# Patient Record
Sex: Male | Born: 1985 | Race: Black or African American | Hispanic: No | Marital: Single | Smoking: Former smoker
Health system: Southern US, Community
[De-identification: ages and names within clinical notes are randomized; demographics above are authoritative.]

## PROBLEM LIST (undated history)

## (undated) DIAGNOSIS — F1910 Other psychoactive substance abuse, uncomplicated: Secondary | ICD-10-CM

## (undated) HISTORY — PX: NO PAST SURGERIES: SHX2092

---

## 2019-02-05 DIAGNOSIS — I420 Dilated cardiomyopathy: Secondary | ICD-10-CM

## 2019-02-05 HISTORY — DX: Dilated cardiomyopathy: I42.0

## 2019-02-09 ENCOUNTER — Inpatient Hospital Stay (HOSPITAL_COMMUNITY)
Admission: AD | Admit: 2019-02-09 | Discharge: 2019-03-15 | DRG: 001 | Disposition: A | Discharge status: Home Health | Payer: Medicaid - Out of State | Source: Other Acute Inpatient Hospital | Attending: Internal Medicine | Admitting: Internal Medicine

## 2019-02-09 DIAGNOSIS — I340 Nonrheumatic mitral (valve) insufficiency: Secondary | ICD-10-CM | POA: Diagnosis not present

## 2019-02-09 DIAGNOSIS — Z20822 Contact with and (suspected) exposure to covid-19: Secondary | ICD-10-CM | POA: Diagnosis present

## 2019-02-09 DIAGNOSIS — I428 Other cardiomyopathies: Secondary | ICD-10-CM | POA: Diagnosis not present

## 2019-02-09 DIAGNOSIS — J918 Pleural effusion in other conditions classified elsewhere: Secondary | ICD-10-CM | POA: Diagnosis not present

## 2019-02-09 DIAGNOSIS — Z01810 Encounter for preprocedural cardiovascular examination: Secondary | ICD-10-CM | POA: Diagnosis not present

## 2019-02-09 DIAGNOSIS — Z681 Body mass index (BMI) 19 or less, adult: Secondary | ICD-10-CM

## 2019-02-09 DIAGNOSIS — E876 Hypokalemia: Secondary | ICD-10-CM | POA: Diagnosis not present

## 2019-02-09 DIAGNOSIS — Z95811 Presence of heart assist device: Secondary | ICD-10-CM | POA: Diagnosis not present

## 2019-02-09 DIAGNOSIS — K7460 Unspecified cirrhosis of liver: Secondary | ICD-10-CM | POA: Diagnosis present

## 2019-02-09 DIAGNOSIS — J939 Pneumothorax, unspecified: Secondary | ICD-10-CM | POA: Diagnosis not present

## 2019-02-09 DIAGNOSIS — J90 Pleural effusion, not elsewhere classified: Secondary | ICD-10-CM

## 2019-02-09 DIAGNOSIS — J9811 Atelectasis: Secondary | ICD-10-CM | POA: Diagnosis not present

## 2019-02-09 DIAGNOSIS — I420 Dilated cardiomyopathy: Secondary | ICD-10-CM | POA: Diagnosis present

## 2019-02-09 DIAGNOSIS — J95821 Acute postprocedural respiratory failure: Secondary | ICD-10-CM | POA: Diagnosis not present

## 2019-02-09 DIAGNOSIS — D62 Acute posthemorrhagic anemia: Secondary | ICD-10-CM | POA: Diagnosis not present

## 2019-02-09 DIAGNOSIS — R570 Cardiogenic shock: Secondary | ICD-10-CM | POA: Diagnosis present

## 2019-02-09 DIAGNOSIS — Z7189 Other specified counseling: Secondary | ICD-10-CM

## 2019-02-09 DIAGNOSIS — I509 Heart failure, unspecified: Secondary | ICD-10-CM | POA: Diagnosis not present

## 2019-02-09 DIAGNOSIS — E43 Unspecified severe protein-calorie malnutrition: Secondary | ICD-10-CM | POA: Diagnosis present

## 2019-02-09 DIAGNOSIS — I5023 Acute on chronic systolic (congestive) heart failure: Principal | ICD-10-CM | POA: Diagnosis present

## 2019-02-09 DIAGNOSIS — J189 Pneumonia, unspecified organism: Secondary | ICD-10-CM | POA: Diagnosis present

## 2019-02-09 DIAGNOSIS — I361 Nonrheumatic tricuspid (valve) insufficiency: Secondary | ICD-10-CM | POA: Diagnosis not present

## 2019-02-09 DIAGNOSIS — R0600 Dyspnea, unspecified: Secondary | ICD-10-CM

## 2019-02-09 DIAGNOSIS — F17210 Nicotine dependence, cigarettes, uncomplicated: Secondary | ICD-10-CM | POA: Diagnosis present

## 2019-02-09 DIAGNOSIS — D689 Coagulation defect, unspecified: Secondary | ICD-10-CM | POA: Diagnosis present

## 2019-02-09 DIAGNOSIS — J690 Pneumonitis due to inhalation of food and vomit: Secondary | ICD-10-CM | POA: Diagnosis not present

## 2019-02-09 DIAGNOSIS — F1010 Alcohol abuse, uncomplicated: Secondary | ICD-10-CM | POA: Diagnosis present

## 2019-02-09 DIAGNOSIS — Z8249 Family history of ischemic heart disease and other diseases of the circulatory system: Secondary | ICD-10-CM

## 2019-02-09 DIAGNOSIS — Z01818 Encounter for other preprocedural examination: Secondary | ICD-10-CM | POA: Diagnosis not present

## 2019-02-09 DIAGNOSIS — Z9989 Dependence on other enabling machines and devices: Secondary | ICD-10-CM

## 2019-02-09 DIAGNOSIS — Z515 Encounter for palliative care: Secondary | ICD-10-CM | POA: Diagnosis not present

## 2019-02-09 DIAGNOSIS — Z9889 Other specified postprocedural states: Secondary | ICD-10-CM

## 2019-02-09 DIAGNOSIS — F1410 Cocaine abuse, uncomplicated: Secondary | ICD-10-CM | POA: Diagnosis present

## 2019-02-09 HISTORY — DX: Other psychoactive substance abuse, uncomplicated: F19.10

## 2019-02-10 ENCOUNTER — Inpatient Hospital Stay: Payer: Self-pay

## 2019-02-10 ENCOUNTER — Encounter (HOSPITAL_COMMUNITY): Payer: Self-pay | Admitting: Internal Medicine

## 2019-02-10 ENCOUNTER — Inpatient Hospital Stay (HOSPITAL_COMMUNITY): Payer: Medicaid - Out of State

## 2019-02-10 DIAGNOSIS — I340 Nonrheumatic mitral (valve) insufficiency: Secondary | ICD-10-CM

## 2019-02-10 DIAGNOSIS — I361 Nonrheumatic tricuspid (valve) insufficiency: Secondary | ICD-10-CM

## 2019-02-10 DIAGNOSIS — I420 Dilated cardiomyopathy: Secondary | ICD-10-CM

## 2019-02-10 LAB — COOXEMETRY PANEL
Carboxyhemoglobin: 1 % (ref 0.5–1.5)
Carboxyhemoglobin: 1.2 % (ref 0.5–1.5)
Methemoglobin: 1 % (ref 0.0–1.5)
Methemoglobin: 1 % (ref 0.0–1.5)
O2 Saturation: 29.4 %
O2 Saturation: 33.2 %
Total hemoglobin: 11.8 g/dL — ABNORMAL LOW (ref 12.0–16.0)
Total hemoglobin: 12.1 g/dL (ref 12.0–16.0)

## 2019-02-10 LAB — URINALYSIS, ROUTINE W REFLEX MICROSCOPIC
Bilirubin Urine: NEGATIVE
Glucose, UA: NEGATIVE mg/dL
Hgb urine dipstick: NEGATIVE
Ketones, ur: NEGATIVE mg/dL
Leukocytes,Ua: NEGATIVE
Nitrite: NEGATIVE
Protein, ur: NEGATIVE mg/dL
Specific Gravity, Urine: 1.006 (ref 1.005–1.030)
pH: 5 (ref 5.0–8.0)

## 2019-02-10 LAB — CBC WITH DIFFERENTIAL/PLATELET
Abs Immature Granulocytes: 0.06 10*3/uL (ref 0.00–0.07)
Basophils Absolute: 0.1 10*3/uL (ref 0.0–0.1)
Basophils Relative: 1 %
Eosinophils Absolute: 0.2 10*3/uL (ref 0.0–0.5)
Eosinophils Relative: 2 %
HCT: 35.6 % — ABNORMAL LOW (ref 39.0–52.0)
Hemoglobin: 12.1 g/dL — ABNORMAL LOW (ref 13.0–17.0)
Immature Granulocytes: 1 %
Lymphocytes Relative: 29 %
Lymphs Abs: 2.6 10*3/uL (ref 0.7–4.0)
MCH: 33.2 pg (ref 26.0–34.0)
MCHC: 34 g/dL (ref 30.0–36.0)
MCV: 97.8 fL (ref 80.0–100.0)
Monocytes Absolute: 0.9 10*3/uL (ref 0.1–1.0)
Monocytes Relative: 10 %
Neutro Abs: 5.2 10*3/uL (ref 1.7–7.7)
Neutrophils Relative %: 57 %
Platelets: 226 10*3/uL (ref 150–400)
RBC: 3.64 MIL/uL — ABNORMAL LOW (ref 4.22–5.81)
RDW: 15.9 % — ABNORMAL HIGH (ref 11.5–15.5)
WBC: 9 10*3/uL (ref 4.0–10.5)
nRBC: 0 % (ref 0.0–0.2)

## 2019-02-10 LAB — COMPREHENSIVE METABOLIC PANEL
ALT: 34 U/L (ref 0–44)
AST: 33 U/L (ref 15–41)
Albumin: 2.8 g/dL — ABNORMAL LOW (ref 3.5–5.0)
Alkaline Phosphatase: 64 U/L (ref 38–126)
Anion gap: 10 (ref 5–15)
BUN: 10 mg/dL (ref 6–20)
CO2: 21 mmol/L — ABNORMAL LOW (ref 22–32)
Calcium: 8.7 mg/dL — ABNORMAL LOW (ref 8.9–10.3)
Chloride: 100 mmol/L (ref 98–111)
Creatinine, Ser: 1.18 mg/dL (ref 0.61–1.24)
GFR calc Af Amer: >60 mL/min (ref >60)
GFR calc non Af Amer: >60 mL/min (ref >60)
Glucose, Bld: 129 mg/dL — ABNORMAL HIGH (ref 70–99)
Potassium: 4.4 mmol/L (ref 3.5–5.1)
Sodium: 131 mmol/L — ABNORMAL LOW (ref 135–145)
Total Bilirubin: 1.9 mg/dL — ABNORMAL HIGH (ref 0.3–1.2)
Total Protein: 6.1 g/dL — ABNORMAL LOW (ref 6.5–8.1)

## 2019-02-10 LAB — HIV ANTIBODY (ROUTINE TESTING W REFLEX): HIV Screen 4th Generation wRfx: NONREACTIVE

## 2019-02-10 LAB — BRAIN NATRIURETIC PEPTIDE: B Natriuretic Peptide: 2048.6 pg/mL — ABNORMAL HIGH (ref 0.0–100.0)

## 2019-02-10 LAB — PROCALCITONIN: Procalcitonin: 0.38 ng/mL

## 2019-02-10 LAB — PROTIME-INR
INR: 1.2 (ref 0.8–1.2)
Prothrombin Time: 15.2 seconds (ref 11.4–15.2)

## 2019-02-10 LAB — ECHOCARDIOGRAM COMPLETE
Height: 68 in
Weight: 1980.8 oz

## 2019-02-10 LAB — SARS CORONAVIRUS 2 (TAT 6-24 HRS): SARS Coronavirus 2: NEGATIVE

## 2019-02-10 LAB — MAGNESIUM: Magnesium: 1.7 mg/dL (ref 1.7–2.4)

## 2019-02-10 LAB — MRSA PCR SCREENING: MRSA by PCR: NEGATIVE

## 2019-02-10 MED ORDER — ENOXAPARIN SODIUM 40 MG/0.4ML ~~LOC~~ SOLN
40.0000 mg | Freq: Every day | SUBCUTANEOUS | Status: DC
  Administered 2019-02-10: 40 mg via SUBCUTANEOUS
  Filled 2019-02-10 (×2): qty 0.4

## 2019-02-10 MED ORDER — ONDANSETRON HCL 4 MG/2ML IJ SOLN: 4.0000 mg | Freq: Four times a day (QID) | INTRAMUSCULAR | Status: DC | PRN

## 2019-02-10 MED ORDER — ATROPINE SULFATE 0.5 MG/5ML IJ SOSY
0.50 | PREFILLED_SYRINGE | INTRAMUSCULAR | Status: DC
Start: ? — End: 2019-02-10

## 2019-02-10 MED ORDER — DIGOXIN 125 MCG PO TABS
0.1250 mg | ORAL_TABLET | Freq: Every day | ORAL | Status: DC
  Administered 2019-02-10 – 2019-02-25 (×16): 0.125 mg via ORAL
  Filled 2019-02-10 (×17): qty 1

## 2019-02-10 MED ORDER — SODIUM CHLORIDE 0.9% FLUSH: 10.0000 mL | INTRAVENOUS | Status: DC | PRN

## 2019-02-10 MED ORDER — ONDANSETRON HCL 4 MG/2ML IJ SOLN
4.00 | INTRAMUSCULAR | Status: DC
Start: ? — End: 2019-02-10

## 2019-02-10 MED ORDER — FUROSEMIDE 20 MG PO TABS: 20.0000 mg | ORAL_TABLET | Freq: Every day | ORAL | Status: DC

## 2019-02-10 MED ORDER — SPIRONOLACTONE 12.5 MG HALF TABLET
12.5000 mg | ORAL_TABLET | Freq: Every day | ORAL | Status: DC
  Administered 2019-02-10 – 2019-02-14 (×5): 12.5 mg via ORAL
  Filled 2019-02-10 (×6): qty 1

## 2019-02-10 MED ORDER — FUROSEMIDE 10 MG/ML IJ SOLN
80.0000 mg | Freq: Two times a day (BID) | INTRAMUSCULAR | Status: DC
  Administered 2019-02-10 – 2019-02-11 (×3): 80 mg via INTRAVENOUS
  Filled 2019-02-10 (×3): qty 8

## 2019-02-10 MED ORDER — GENERIC EXTERNAL MEDICATION
Status: DC
Start: ? — End: 2019-02-10

## 2019-02-10 MED ORDER — DOBUTAMINE IN D5W 4-5 MG/ML-% IV SOLN
7.5000 ug/kg/min | INTRAVENOUS | Status: DC
  Administered 2019-02-10: 2.5 ug/kg/min via INTRAVENOUS
  Filled 2019-02-10: qty 250

## 2019-02-10 MED ORDER — ALPRAZOLAM 0.25 MG PO TABS
0.2500 mg | ORAL_TABLET | Freq: Two times a day (BID) | ORAL | Status: DC | PRN
  Administered 2019-02-13: 0.25 mg via ORAL
  Filled 2019-02-10 (×2): qty 1

## 2019-02-10 MED ORDER — METOPROLOL SUCCINATE ER 25 MG PO TB24
25.0000 mg | ORAL_TABLET | Freq: Every day | ORAL | Status: DC
  Administered 2019-02-10: 25 mg via ORAL
  Filled 2019-02-10: qty 1

## 2019-02-10 MED ORDER — DOBUTAMINE IN D5W 4-5 MG/ML-% IV SOLN
2.50 | INTRAVENOUS | Status: DC
Start: ? — End: 2019-02-10

## 2019-02-10 MED ORDER — NITROGLYCERIN 0.4 MG SL SUBL
0.40 | SUBLINGUAL_TABLET | SUBLINGUAL | Status: DC
Start: ? — End: 2019-02-10

## 2019-02-10 MED ORDER — SODIUM CHLORIDE 0.9% FLUSH
3.0000 mL | Freq: Two times a day (BID) | INTRAVENOUS | Status: DC
  Administered 2019-02-10 – 2019-02-20 (×7): 3 mL via INTRAVENOUS

## 2019-02-10 MED ORDER — METOPROLOL TARTRATE 25 MG PO TABS
12.50 | ORAL_TABLET | ORAL | Status: DC
Start: 2019-02-10 — End: 2019-02-10

## 2019-02-10 MED ORDER — MAGNESIUM SULFATE 2 GM/50ML IV SOLN
2.0000 g | Freq: Once | INTRAVENOUS | Status: AC
  Administered 2019-02-10: 2 g via INTRAVENOUS
  Filled 2019-02-10: qty 50

## 2019-02-10 MED ORDER — SODIUM CHLORIDE 0.9 % IV SOLN: 250.0000 mL | INTRAVENOUS | Status: DC | PRN

## 2019-02-10 MED ORDER — SODIUM CHLORIDE 0.9 % IV SOLN
2.0000 g | INTRAVENOUS | Status: AC
  Administered 2019-02-10 – 2019-02-14 (×5): 2 g via INTRAVENOUS
  Filled 2019-02-10: qty 2
  Filled 2019-02-10 (×2): qty 20
  Filled 2019-02-10 (×2): qty 2
  Filled 2019-02-10: qty 20

## 2019-02-10 MED ORDER — DM-GUAIFENESIN ER 30-600 MG PO TB12
1.0000 | ORAL_TABLET | Freq: Two times a day (BID) | ORAL | Status: DC
  Administered 2019-02-10 – 2019-02-18 (×18): 1 via ORAL
  Filled 2019-02-10 (×19): qty 1

## 2019-02-10 MED ORDER — CHLORHEXIDINE GLUCONATE CLOTH 2 % EX PADS
6.0000 | MEDICATED_PAD | Freq: Every day | CUTANEOUS | Status: DC
  Administered 2019-02-10 – 2019-02-25 (×14): 6 via TOPICAL

## 2019-02-10 MED ORDER — ACETAMINOPHEN 325 MG PO TABS: 650.0000 mg | ORAL_TABLET | ORAL | Status: DC | PRN

## 2019-02-10 MED ORDER — ZOLPIDEM TARTRATE 5 MG PO TABS: 5.0000 mg | ORAL_TABLET | Freq: Every evening | ORAL | Status: DC | PRN

## 2019-02-10 MED ORDER — SODIUM CHLORIDE 0.9% FLUSH
10.0000 mL | Freq: Two times a day (BID) | INTRAVENOUS | Status: DC
  Administered 2019-02-10 – 2019-02-20 (×8): 10 mL

## 2019-02-10 MED ORDER — HEPARIN SODIUM (PORCINE) 5000 UNIT/ML IJ SOLN
5000.00 | INTRAMUSCULAR | Status: DC
Start: 2019-02-10 — End: 2019-02-10

## 2019-02-10 MED ORDER — ACETAMINOPHEN 325 MG PO TABS
650.00 | ORAL_TABLET | ORAL | Status: DC
Start: ? — End: 2019-02-10

## 2019-02-10 MED ORDER — SODIUM CHLORIDE 0.9 % IV SOLN
100.0000 mg | Freq: Two times a day (BID) | INTRAVENOUS | Status: AC
  Administered 2019-02-10 – 2019-02-15 (×10): 100 mg via INTRAVENOUS
  Filled 2019-02-10 (×11): qty 100

## 2019-02-10 MED ORDER — SODIUM CHLORIDE 0.9% FLUSH: 3.0000 mL | INTRAVENOUS | Status: DC | PRN

## 2019-02-10 MED ORDER — BENZONATATE 100 MG PO CAPS
100.00 | ORAL_CAPSULE | ORAL | Status: DC
Start: 2019-02-10 — End: 2019-02-10

## 2019-02-10 NOTE — Progress Notes (Signed)
   PICC placed.  CO-OX 29%. Increase dobutamine to 5 mcg. Repeat CO-OX 2 hours   Cayenne Breault NP-C  3:50 PM

## 2019-02-10 NOTE — Progress Notes (Signed)
Echocardiogram 2D Echocardiogram has been performed.  Warren Lacy Neamiah Sciarra 02/10/2019, 11:25 AM

## 2019-02-10 NOTE — Progress Notes (Signed)
Peripherally Inserted Central Catheter/Midline Placement  The IV Nurse has discussed with the patient and/or persons authorized to consent for the patient, the purpose of this procedure and the potential benefits and risks involved with this procedure.  The benefits include less needle sticks, lab draws from the catheter, and the patient may be discharged home with the catheter. Risks include, but not limited to, infection, bleeding, blood clot (thrombus formation), and puncture of an artery; nerve damage and irregular heartbeat and possibility to perform a PICC exchange if needed/ordered by physician.  Alternatives to this procedure were also discussed.  Bard Power PICC patient education guide, fact sheet on infection prevention and patient information card has been provided to patient /or left at bedside.    PICC/Midline Placement Documentation  PICC Double Lumen 02/10/19 PICC Right Basilic 38 cm 0 cm (Active)  Indication for Insertion or Continuance of Line Poor Vasculature-patient has had multiple peripheral attempts or PIVs lasting less than 24 hours 02/10/19 1300  Exposed Catheter (cm) 0 cm 02/10/19 1300  Site Assessment Clean;Dry;Intact 02/10/19 1300  Lumen #1 Status Flushed;Saline locked;Blood return noted 02/10/19 1300  Lumen #2 Status Flushed;Saline locked;Blood return noted 02/10/19 1300  Dressing Type Transparent;Securing device 02/10/19 1300  Dressing Status Clean;Dry;Intact;Antimicrobial disc in place 02/10/19 1300  Line Care Connections checked and tightened 02/10/19 1300  Dressing Intervention New dressing;Other (Comment) 02/10/19 1300  Dressing Change Due 02/17/19 02/10/19 1300     Patient gave written consent  Jon Martin 02/10/2019, 1:11 PM

## 2019-02-10 NOTE — Progress Notes (Signed)
MCS EDUCATION NOTE:                VAD evaluation consent reviewed and signed by patient, designated caregiver not present.  Initial VAD teaching completed with patient.   VAD educational packet including "HM II Patient Handbook", "HM II Left Ventricular Assist System" packet, "[place] HM II Patient Education", and "Decision Aids for Left Ventricular Assist Device" reviewed in detail and left at bedside for continued reference.  All questions answered regarding VAD implant, hospital stay, and what to expect when discharged home living with a heart pump. Pt identified his mother and brother as his primary caregivers if this therapy should be deemed appropriate.  Explained need for 24/7 care when pt is discharged home due to sternal precautions, adaptation to living on support, emotional support, consistent and meticulous exit site care and management, medication adherence and high volume of follow up visits with the [facility] after discharge; both pt and caregiver verbalized understanding of above.   Explained that LVAD can be implanted for two indications in the setting of advanced left ventricular heart failure treatment:  1. Bridge to transplant - used for patients who cannot safely wait for heart transplant without this device.        Or    2. Destination therapy - used for patients until end of life or recovery of heart function.  Patient acknowledges that the indication at this point in time for LVAD therapy would be for DT due to recent positive drug screen for cocaine.   Provided brief equipment overview and demonstration with HeartMate III including discussion on the following:    A) pump  B) controller  C) batteries/battery clips  Reviewed and supplied a copy of home inspection check list stressing that only three pronged grounded power outlets can be used for VAD equipment. Patient confirmed home has electrical outlets that will support the equipment.    Identified the following  lifestyle modifications while living on MCS:   1. No driving for at least six weeks and then only if doctor gives permission to do so.   2. No tub baths while pump implanted, and shower only when doctor gives permission.   3. No swimming or submersion in water while implanted with pump.   4. No contact sports or engaging in jumping activities.    Discussed with pt and family that they will be required to purchase dressing supplies as long as patient has the VAD in place.   Reinforced need for 24 hour/7 day week caregivers; pt designated mother and brother as caregivers. He will also need to abide by sternal precautions with no lifting >10lbs, pushing, pulling and will need assistance with adapting to new life style with VAD equipment and care.   Intermacs patient survival statistics through June 2020 reviewed with patient and caregiver as follows:                                                The patient understands that from this discussion it does not mean that they will receive the device, but that depends on an extensive evaluation process. The patient is aware of the fact that if at anytime they want to stop the evaluation process they can.  All questions have been answered at this time and contact information was provided should they encounter any further questions.  He is agreeable at this time to the evaluation process and will move forward.    [name], RN VAD Coordinator   Office: [phone] 24/7 VAD Pager: [phone]

## 2019-02-10 NOTE — Progress Notes (Signed)
Pt with [place] M[place]s score. Not an acute change as this appears to be pts baseline. Will continue to monitor.

## 2019-02-10 NOTE — Progress Notes (Addendum)
Advanced Heart Failure Rounding Note  PCP-Cardiologist: No primary care provider on file.   Subjective:    Transferred to [facility] for Advanced Heart Failure consultation.   Remains dobutamine 2.5 mcg.   Complaining of cough.   Objective:   Weight Range: 56.2 kg Body mass index is 18.82 kg/m.   Vital Signs:   Temp:  [97.6 F (36.4 C)-98 F (36.7 C)] 97.7 F (36.5 C) (01/06 0755) Pulse Rate:  [116-128] 123 (01/06 0800) Resp:  [11-29] 18 (01/06 0800) BP: (91-103)/(71-78) 99/73 (01/06 0755) SpO2:  [90 %-98 %] 98 % (01/06 0800) Weight:  [56.2 kg] 56.2 kg (01/06 0409) Last BM Date: 02/09/19  Weight change: Filed Weights   02/10/19 0024 02/10/19 0409  Weight: 56.2 kg 56.2 kg    Intake/Output:   Intake/Output Summary (Last 24 hours) at 02/10/2019 0912 Last data filed at 02/10/2019 0300 Gross per 24 hour  Intake 244.02 ml  Output --  Net 244.02 ml      Physical Exam    General:  Thin  No resp difficulty HEENT: Normal Neck: Supple. JVP to jaw  . Carotids 2+ bilat; no bruits. No lymphadenopathy or thyromegaly appreciated. Cor: PMI nondisplaced. Regular rate & rhythm. No rubs, or murmurs. + S3  Lungs: Clear Abdomen: Soft, nontender, nondistended. No hepatosplenomegaly. No bruits or masses. Good bowel sounds. Extremities: Cool extremities, no cyanosis, clubbing, rash, trace lower extremity edema Neuro: Alert & orientedx3, cranial nerves grossly intact. moves all 4 extremities w/o difficulty. Affect pleasant   Telemetry  Sinus Tach 110s   EKG   Sinus Tach 119 bpm    Labs    CBC Recent Labs    02/10/19 0219  WBC 9.0  NEUTROABS 5.2  HGB 12.1*  HCT 35.6*  MCV 97.8  PLT 226   Basic Metabolic Panel Recent Labs    77/82/42 0219  NA 131*  K 4.4  CL 100  CO2 21*  GLUCOSE 129*  BUN 10  CREATININE 1.18  CALCIUM 8.7*  MG 1.7   Liver Function Tests Recent Labs    02/10/19 0219  AST 33  ALT 34  ALKPHOS 64  BILITOT 1.9*  PROT 6.1*  ALBUMIN 2.8*    No results for input(s): LIPASE, AMYLASE in the last 72 hours. Cardiac Enzymes No results for input(s): CKTOTAL, CKMB, CKMBINDEX, TROPONINI in the last 72 hours.  BNP: BNP (last 3 results) Recent Labs    02/10/19 0219  BNP 2,048.6*    ProBNP (last 3 results) No results for input(s): PROBNP in the last 8760 hours.   D-Dimer No results for input(s): DDIMER in the last 72 hours. Hemoglobin A1C No results for input(s): HGBA1C in the last 72 hours. Fasting Lipid Panel No results for input(s): CHOL, HDL, LDLCALC, TRIG, CHOLHDL, LDLDIRECT in the last 72 hours. Thyroid Function Tests No results for input(s): TSH, T4TOTAL, T3FREE, THYROIDAB in the last 72 hours.  Invalid input(s): FREET3  Other results:   Imaging    No results found.   Medications:     Scheduled Medications:  enoxaparin (LOVENOX) injection  40 mg Subcutaneous Daily   furosemide  20 mg Oral Daily   metoprolol succinate  25 mg Oral Daily   sodium chloride flush  3 mL Intravenous Q12H    Infusions:  sodium chloride     DOBUTamine 2.5 mcg/kg/min (02/10/19 0300)   magnesium sulfate bolus IVPB      PRN Medications: sodium chloride, acetaminophen, ALPRAZolam, ondansetron (ZOFRAN) IV, sodium chloride flush, zolpidem  Assessment/Plan   1. Acute/Chronic Systolic HF  ECHO at [facility] EF , 20%. Repeat ECHO today.  On dubutamine 2.5 mcg. Place PICC/CVP and check co-ox. Volume status elevated. Start 80 mg IV lasix twice daily.  - Stop metoprolol with low output HF - Add digoxin 0.125 mg daily.  - Add 12.5 mg spiro daily.  -Consult CT surgery and VAD team.   2. Cough CXR now  Covid neg.   3. ETOH Abuse Drinks alcohol daily. ~6 pack per day.   4. Cocaine  UDS was + at [facility]  He denies cocaine use.    Length of Stay: 1  Amy Clegg, NP  02/10/2019, 9:12 AM  Advanced Heart Failure Team Pager [phone] (M-F; 7a - 4p)  Please contact [facility] for night-coverage after hours  (4p -7a ) and weekends on [email or web address]  Agree. See my note from the same day.   Glori Bickers, MD  11:07 AM

## 2019-02-10 NOTE — H&P (Signed)
Cardiology Admission History and Physical:   Patient ID: Jon Martin MRN: [id number]; DOB: [birth year]   Admission date: 02/09/2019  Primary Care Provider: Nonah Mattes, MD Primary Cardiologist: No primary care provider on file. [place] Primary Electrophysiologist:  None   Chief Complaint:  LVAD eval  Patient Profile:   Jon Martin is a 34 y.o. male with h/o NIDCM, EF 20%, h/o LifeVest transferred from OSH after presenting there w/ SOB/DOE. Apparently he is here for evaluation for possible LVAD; it sounds like he has been admitted several times over the past several weeks and has become essentially inotrope dependent. He was transferred on dobutamine 2.29mcg/kg/min.  History of Present Illness:   [name] was admitted at [facility] 02-07-19 after presenting there with worsening SOB/DOE over a 48h period. He had been admitted for a protracted stay just prior to that presentation for his HF, requiring inotropes. Notes indicate pt's SOB improved w/ inotrope therapy on this admission, and pt was transferred here for consideration for LVAD. It does not sound like volume overload has been a big issue; cardiology notes from [facility] indicate pt has basically been euvolemic at least for the past day or two. Pt currently has no acute complaints. He has dry cough but otherwise asymptomatic from cardiac standpoint.  Heart Pathway Score:     Past Medical History:  Diagnosis Date  . Dilated cardiomyopathy (HCC)   . Polysubstance abuse (HCC)     Dilated CM, presumed non-ischemic (no recent LHC); EF 15-20% H/o PSA   Medications Prior to Admission: Prior to Admission medications   Not on File   Home meds: lasix 20mg  daily Metoprolol 12.5mg  bid  Allergies:   No Known Allergies  Social History:  +current smoker, +hx cocaine and EtOH   Family History:   The patient's family history includes CAD in his father.    ROS:  Please see the history of present illness.  All other  ROS reviewed and negative.     Physical Exam/Data:   Vitals:   02/10/19 0024  BP: 93/71  Pulse: (!) 128  Resp: (!) 23  Temp: 98 F (36.7 C)  TempSrc: Oral  SpO2: 95%   No intake or output data in the 24 hours ending 02/10/19 0110 No flowsheet data found.   There is no height or weight on file to calculate BMI.  General:  Well nourished, well developed, in no acute distress HEENT: normal Lymph: no adenopathy Neck: EJ distended Endocrine:  No thryomegaly Vascular: No carotid bruits; DP pulses difficult to find Cardiac:  normal S1, S2; RRR, tachy; 3/6 apical systolic murmur Lungs:  clear to auscultation bilaterally, no wheezing, rhonchi or rales  Abd: soft, nontender, no hepatomegaly  Ext: no edema Musculoskeletal:  No deformities, BUE and BLE strength normal and equal Skin: warm and dry  Neuro:  No gross neuro deficits Psych:  Normal affect    EKG:  EKG pending; no EKG was sent from transferring medical center Tele shows ST with HR low 100s  Relevant CV Studies: TTE 01-26-19 Dilated LV w/ diffuse HK, EF 15-20%, grade 2 DD, dilated LA, severe MR, mod-severe TR, est RVSP 60-63mmHg, tr PI, bilateral pleural effusions  Laboratory Data:  High Sensitivity Troponin:  No results for input(s): TROPONINIHS in the last 720 hours.    ChemistryNo results for input(s): NA, K, CL, CO2, GLUCOSE, BUN, CREATININE, CALCIUM, GFRNONAA, GFRAA, ANIONGAP in the last 168 hours.  No results for input(s): PROT, ALBUMIN, AST, ALT, ALKPHOS, BILITOT in the last  168 hours. HematologyNo results for input(s): WBC, RBC, HGB, HCT, MCV, MCH, MCHC, RDW, PLT in the last 168 hours. BNPNo results for input(s): BNP, PROBNP in the last 168 hours.  DDimer No results for input(s): DDIMER in the last 168 hours.  01-25-19 TSH 3.10 (N), T4 7.2 Hgb 11.8, Hct 36.4 Na 133, K 4.1, Cr 1.5  Radiology/Studies:  No results found.       Assessment and Plan:   1. Dilated CM: EF 15-20%. Pt will be admitted to  advanced HF service for further w/u for possible LVAD. Continue dobutamine at current dose 2.64mcg/kg/min. Plan discussed w/ advanced HF service (Dr. Haroldine Laws) this evening and he plans to see pt in the AM. Cont low-dose metoprolol (will change tartrate to succinate) and standing lasix 20mg  daily. He is not on ACEi/ARB, presumably due to hypotension at OSH. Appears reasonably well-compensated/opti-volemic.  2. PSA: a little tachycardic, but he is due for BB. No other s/sx active withdrawal   For questions or updates, please contact [place] Please consult [email or web address] for contact info under     Signed, Rudean Curt, MD, [facility]  02/10/2019 1:10 AM

## 2019-02-10 NOTE — Plan of Care (Signed)

## 2019-02-10 NOTE — Progress Notes (Signed)
Pt arrived to unit via EMS. Transferred from another facility. Paged MD to get orders. VS obtained. Pt resting comfortably with call bell in reach. Will continue to monitor.

## 2019-02-10 NOTE — Progress Notes (Signed)
CSW met briefly with patient to discuss LVAD work up and completed psychosocial assessment. Patient reports he lives with his mother and brother in [place]. CSW will meet with patient tomorrow and hopefully have family on the line during assessment. CSW will continue to follow for LVAD work up. Jon Martin, [place], [place]

## 2019-02-10 NOTE — Progress Notes (Signed)
  Dobutamine increased to 5 mcg. CO-OX 33%  Increase to 7.5 mcg. May need to add norepi tomorrow +/- mechanical support.   Amy Clegg NP-C 8:47 PM

## 2019-02-11 ENCOUNTER — Telehealth (HOSPITAL_COMMUNITY): Payer: Self-pay | Admitting: Licensed Clinical Social Worker

## 2019-02-11 ENCOUNTER — Encounter (HOSPITAL_COMMUNITY): Payer: Self-pay | Admitting: Anesthesiology

## 2019-02-11 ENCOUNTER — Inpatient Hospital Stay (HOSPITAL_COMMUNITY): Payer: Medicaid - Out of State

## 2019-02-11 ENCOUNTER — Encounter (HOSPITAL_COMMUNITY)
Admission: AD | Disposition: A | Discharge status: Home Health | Payer: Self-pay | Source: Other Acute Inpatient Hospital | Attending: Internal Medicine

## 2019-02-11 ENCOUNTER — Other Ambulatory Visit: Payer: Self-pay

## 2019-02-11 DIAGNOSIS — E43 Unspecified severe protein-calorie malnutrition: Secondary | ICD-10-CM | POA: Insufficient documentation

## 2019-02-11 DIAGNOSIS — I428 Other cardiomyopathies: Secondary | ICD-10-CM

## 2019-02-11 DIAGNOSIS — I509 Heart failure, unspecified: Secondary | ICD-10-CM

## 2019-02-11 HISTORY — PX: RIGHT/LEFT HEART CATH AND CORONARY ANGIOGRAPHY: CATH118266

## 2019-02-11 LAB — COOXEMETRY PANEL
Carboxyhemoglobin: 0.9 % (ref 0.5–1.5)
Carboxyhemoglobin: 1 % (ref 0.5–1.5)
Carboxyhemoglobin: 1.2 % (ref 0.5–1.5)
Methemoglobin: 0.5 % (ref 0.0–1.5)
Methemoglobin: 1.2 % (ref 0.0–1.5)
Methemoglobin: 1.1 % (ref 0.0–1.5)
O2 Saturation: 21.4 %
O2 Saturation: 29.6 %
O2 Saturation: 42.4 %
Total hemoglobin: 11.4 g/dL — ABNORMAL LOW (ref 12.0–16.0)
Total hemoglobin: 11.3 g/dL — ABNORMAL LOW (ref 12.0–16.0)
Total hemoglobin: 11.7 g/dL — ABNORMAL LOW (ref 12.0–16.0)

## 2019-02-11 LAB — ANTITHROMBIN III: AntiThromb III Func: 68 % — ABNORMAL LOW (ref 75–120)

## 2019-02-11 LAB — POCT I-STAT EG7
Acid-Base Excess: 1 mmol/L (ref 0.0–2.0)
Acid-Base Excess: 2 mmol/L (ref 0.0–2.0)
Acid-Base Excess: 2 mmol/L (ref 0.0–2.0)
Bicarbonate: 24.5 mmol/L (ref 20.0–28.0)
Bicarbonate: 25.7 mmol/L (ref 20.0–28.0)
Bicarbonate: 25.3 mmol/L (ref 20.0–28.0)
Calcium, Ion: 1.03 mmol/L — ABNORMAL LOW (ref 1.15–1.40)
Calcium, Ion: 1.1 mmol/L — ABNORMAL LOW (ref 1.15–1.40)
Calcium, Ion: 1.08 mmol/L — ABNORMAL LOW (ref 1.15–1.40)
HCT: 35 % — ABNORMAL LOW (ref 39.0–52.0)
HCT: 36 % — ABNORMAL LOW (ref 39.0–52.0)
HCT: 39 % (ref 39.0–52.0)
Hemoglobin: 11.9 g/dL — ABNORMAL LOW (ref 13.0–17.0)
Hemoglobin: 12.2 g/dL — ABNORMAL LOW (ref 13.0–17.0)
Hemoglobin: 13.3 g/dL (ref 13.0–17.0)
O2 Saturation: 70 %
O2 Saturation: 71 %
O2 Saturation: 68 %
Potassium: 2.9 mmol/L — ABNORMAL LOW (ref 3.5–5.1)
Potassium: 3.1 mmol/L — ABNORMAL LOW (ref 3.5–5.1)
Potassium: 3.1 mmol/L — ABNORMAL LOW (ref 3.5–5.1)
Sodium: 137 mmol/L (ref 135–145)
Sodium: 139 mmol/L (ref 135–145)
Sodium: 148 mmol/L — ABNORMAL HIGH (ref 135–145)
TCO2: 26 mmol/L (ref 22–32)
TCO2: 27 mmol/L (ref 22–32)
TCO2: 26 mmol/L (ref 22–32)
pCO2, Ven: 34.7 mmHg — ABNORMAL LOW (ref 44.0–60.0)
pCO2, Ven: 35.9 mmHg — ABNORMAL LOW (ref 44.0–60.0)
pCO2, Ven: 35.9 mmHg — ABNORMAL LOW (ref 44.0–60.0)
pH, Ven: 7.457 — ABNORMAL HIGH (ref 7.250–7.430)
pH, Ven: 7.463 — ABNORMAL HIGH (ref 7.250–7.430)
pH, Ven: 7.456 — ABNORMAL HIGH (ref 7.250–7.430)
pO2, Ven: 34 mmHg (ref 32.0–45.0)
pO2, Ven: 35 mmHg (ref 32.0–45.0)
pO2, Ven: 33 mmHg (ref 32.0–45.0)

## 2019-02-11 LAB — BASIC METABOLIC PANEL
Anion gap: 10 (ref 5–15)
BUN: 13 mg/dL (ref 6–20)
CO2: 25 mmol/L (ref 22–32)
Calcium: 8.4 mg/dL — ABNORMAL LOW (ref 8.9–10.3)
Chloride: 98 mmol/L (ref 98–111)
Creatinine, Ser: 1.14 mg/dL (ref 0.61–1.24)
GFR calc Af Amer: >60 mL/min (ref >60)
GFR calc non Af Amer: >60 mL/min (ref >60)
Glucose, Bld: 146 mg/dL — ABNORMAL HIGH (ref 70–99)
Potassium: 3.7 mmol/L (ref 3.5–5.1)
Sodium: 133 mmol/L — ABNORMAL LOW (ref 135–145)

## 2019-02-11 LAB — POCT I-STAT 7, (LYTES, BLD GAS, ICA,H+H)
Bicarbonate: 23.3 mmol/L (ref 20.0–28.0)
Calcium, Ion: 0.97 mmol/L — ABNORMAL LOW (ref 1.15–1.40)
HCT: 34 % — ABNORMAL LOW (ref 39.0–52.0)
Hemoglobin: 11.6 g/dL — ABNORMAL LOW (ref 13.0–17.0)
O2 Saturation: 97 %
Potassium: 3 mmol/L — ABNORMAL LOW (ref 3.5–5.1)
Sodium: 140 mmol/L (ref 135–145)
TCO2: 24 mmol/L (ref 22–32)
pCO2 arterial: 31.9 mmHg — ABNORMAL LOW (ref 32.0–48.0)
pH, Arterial: 7.472 — ABNORMAL HIGH (ref 7.350–7.450)
pO2, Arterial: 87 mmHg (ref 83.0–108.0)

## 2019-02-11 LAB — LIPID PANEL
Cholesterol: 89 mg/dL (ref 0–200)
HDL: 25 mg/dL — ABNORMAL LOW (ref >40)
LDL Cholesterol: 52 mg/dL (ref 0–99)
Total CHOL/HDL Ratio: 3.6 RATIO
Triglycerides: 58 mg/dL (ref <150)
VLDL: 12 mg/dL (ref 0–40)

## 2019-02-11 LAB — HEMOGLOBIN A1C
Hgb A1c MFr Bld: 5.2 % (ref 4.8–5.6)
Mean Plasma Glucose: 102.54 mg/dL

## 2019-02-11 LAB — URIC ACID: Uric Acid, Serum: 6 mg/dL (ref 3.7–8.6)

## 2019-02-11 LAB — HEPATITIS B SURFACE ANTIBODY,QUALITATIVE: Hep B S Ab: REACTIVE — AB

## 2019-02-11 LAB — ECHO INTRAOPERATIVE TEE
Height: 68 in
Weight: 1922.41 oz

## 2019-02-11 LAB — ABO/RH: ABO/RH(D): O POS

## 2019-02-11 LAB — PROCALCITONIN: Procalcitonin: 0.44 ng/mL

## 2019-02-11 LAB — MAGNESIUM: Magnesium: 1.7 mg/dL (ref 1.7–2.4)

## 2019-02-11 LAB — HEPATITIS B CORE ANTIBODY, IGM: Hep B C IgM: NONREACTIVE

## 2019-02-11 LAB — LACTATE DEHYDROGENASE: LDH: 243 U/L — ABNORMAL HIGH (ref 98–192)

## 2019-02-11 LAB — PREALBUMIN: Prealbumin: 8.6 mg/dL — ABNORMAL LOW (ref 18–38)

## 2019-02-11 LAB — PREPARE RBC (CROSSMATCH)

## 2019-02-11 LAB — HEPATITIS C ANTIBODY: HCV Ab: NONREACTIVE

## 2019-02-11 LAB — HEPATITIS B SURFACE ANTIGEN: Hepatitis B Surface Ag: NONREACTIVE

## 2019-02-11 SURGERY — CANCELLED PROCEDURE
Anesthesia: General

## 2019-02-11 SURGERY — RIGHT/LEFT HEART CATH AND CORONARY ANGIOGRAPHY
Anesthesia: LOCAL

## 2019-02-11 MED ORDER — GENERIC EXTERNAL MEDICATION
Status: DC
Start: ? — End: 2019-02-11

## 2019-02-11 MED ORDER — ENSURE ENLIVE PO LIQD
237.0000 mL | Freq: Three times a day (TID) | ORAL | Status: DC
  Administered 2019-02-12 – 2019-02-25 (×38): 237 mL via ORAL

## 2019-02-11 MED ORDER — ENOXAPARIN SODIUM 40 MG/0.4ML ~~LOC~~ SOLN
40.0000 mg | SUBCUTANEOUS | Status: DC
  Administered 2019-02-12 – 2019-02-25 (×14): 40 mg via SUBCUTANEOUS
  Filled 2019-02-11 (×15): qty 0.4

## 2019-02-11 MED ORDER — SODIUM CHLORIDE 0.9 % IV SOLN
250.0000 mL | INTRAVENOUS | Status: DC | PRN
  Administered 2019-02-12 – 2019-02-17 (×2): 250 mL via INTRAVENOUS

## 2019-02-11 MED ORDER — GUAIFENESIN ER 600 MG PO TB12: 600.0000 mg | ORAL_TABLET | Freq: Two times a day (BID) | ORAL | Status: DC

## 2019-02-11 MED ORDER — VERAPAMIL HCL 2.5 MG/ML IV SOLN
INTRAVENOUS | Status: DC | PRN
  Administered 2019-02-11: 10 mL via INTRA_ARTERIAL

## 2019-02-11 MED ORDER — HYDRALAZINE HCL 20 MG/ML IJ SOLN: 10.0000 mg | INTRAMUSCULAR | Status: AC | PRN

## 2019-02-11 MED ORDER — SODIUM CHLORIDE 0.9% FLUSH
3.0000 mL | Freq: Two times a day (BID) | INTRAVENOUS | Status: DC
  Administered 2019-02-11 – 2019-02-20 (×6): 3 mL via INTRAVENOUS

## 2019-02-11 MED ORDER — HEPARIN (PORCINE) IN NACL 1000-0.9 UT/500ML-% IV SOLN
INTRAVENOUS | Status: DC | PRN
  Administered 2019-02-11: 500 mL

## 2019-02-11 MED ORDER — POTASSIUM CHLORIDE 10 MEQ/50ML IV SOLN: 10.0000 meq | INTRAVENOUS | Status: DC

## 2019-02-11 MED ORDER — POTASSIUM CHLORIDE CRYS ER 20 MEQ PO TBCR
20.0000 meq | EXTENDED_RELEASE_TABLET | Freq: Once | ORAL | Status: DC
  Filled 2019-02-11: qty 1

## 2019-02-11 MED ORDER — AMIODARONE LOAD VIA INFUSION
150.0000 mg | Freq: Once | INTRAVENOUS | Status: AC
  Administered 2019-02-11: 150 mg via INTRAVENOUS
  Filled 2019-02-11: qty 83.34

## 2019-02-11 MED ORDER — SODIUM CHLORIDE 0.9% FLUSH: 10.0000 mL | INTRAVENOUS | Status: DC | PRN

## 2019-02-11 MED ORDER — MIDAZOLAM HCL (PF) 10 MG/2ML IJ SOLN
INTRAMUSCULAR | Status: AC
  Filled 2019-02-11: qty 2

## 2019-02-11 MED ORDER — HEPARIN SODIUM (PORCINE) 1000 UNIT/ML IJ SOLN
INTRAMUSCULAR | Status: AC
  Filled 2019-02-11: qty 1

## 2019-02-11 MED ORDER — SODIUM CHLORIDE 0.9 % IV SOLN
INTRAVENOUS | Status: AC
  Filled 2019-02-11: qty 1.2

## 2019-02-11 MED ORDER — DOBUTAMINE IN D5W 4-5 MG/ML-% IV SOLN
1.0000 ug/kg/min | INTRAVENOUS | Status: DC
  Administered 2019-02-12: 5 ug/kg/min via INTRAVENOUS
  Filled 2019-02-11 (×2): qty 250

## 2019-02-11 MED ORDER — ETOMIDATE 2 MG/ML IV SOLN
INTRAVENOUS | Status: AC
  Filled 2019-02-11: qty 10

## 2019-02-11 MED ORDER — SODIUM CHLORIDE 0.9% FLUSH: 3.0000 mL | INTRAVENOUS | Status: DC | PRN

## 2019-02-11 MED ORDER — FUROSEMIDE 10 MG/ML IJ SOLN
80.0000 mg | Freq: Once | INTRAMUSCULAR | Status: AC
  Administered 2019-02-11: 80 mg via INTRAVENOUS
  Filled 2019-02-11: qty 8

## 2019-02-11 MED ORDER — SODIUM CHLORIDE 0.9 % IV SOLN: 250.0000 mL | INTRAVENOUS | Status: DC | PRN

## 2019-02-11 MED ORDER — AMIODARONE HCL IN DEXTROSE 360-4.14 MG/200ML-% IV SOLN
60.0000 mg/h | INTRAVENOUS | Status: DC
  Administered 2019-02-11: 60 mg/h via INTRAVENOUS
  Filled 2019-02-11: qty 200

## 2019-02-11 MED ORDER — LIDOCAINE HCL (PF) 1 % IJ SOLN
INTRAMUSCULAR | Status: AC
  Filled 2019-02-11: qty 30

## 2019-02-11 MED ORDER — ONDANSETRON HCL 4 MG/2ML IJ SOLN
4.0000 mg | Freq: Four times a day (QID) | INTRAMUSCULAR | Status: DC | PRN
  Administered 2019-02-11 – 2019-02-19 (×3): 4 mg via INTRAVENOUS
  Filled 2019-02-11 (×3): qty 2

## 2019-02-11 MED ORDER — SODIUM CHLORIDE 0.9% FLUSH
10.0000 mL | Freq: Two times a day (BID) | INTRAVENOUS | Status: DC
  Administered 2019-02-11 – 2019-02-25 (×21): 10 mL

## 2019-02-11 MED ORDER — SODIUM CHLORIDE 0.9% FLUSH
3.0000 mL | Freq: Two times a day (BID) | INTRAVENOUS | Status: DC
  Administered 2019-02-11 – 2019-02-20 (×7): 3 mL via INTRAVENOUS

## 2019-02-11 MED ORDER — AMIODARONE HCL IN DEXTROSE 360-4.14 MG/200ML-% IV SOLN: 30.0000 mg/h | INTRAVENOUS | Status: DC

## 2019-02-11 MED ORDER — MIDAZOLAM HCL 2 MG/2ML IJ SOLN
INTRAMUSCULAR | Status: AC
  Filled 2019-02-11: qty 2

## 2019-02-11 MED ORDER — ASPIRIN 81 MG PO CHEW
81.0000 mg | CHEWABLE_TABLET | ORAL | Status: AC
  Administered 2019-02-11: 81 mg via ORAL
  Filled 2019-02-11: qty 1

## 2019-02-11 MED ORDER — HEPARIN (PORCINE) IN NACL 1000-0.9 UT/500ML-% IV SOLN
INTRAVENOUS | Status: AC
  Filled 2019-02-11: qty 1000

## 2019-02-11 MED ORDER — FENTANYL CITRATE (PF) 250 MCG/5ML IJ SOLN
INTRAMUSCULAR | Status: AC
  Filled 2019-02-11: qty 20

## 2019-02-11 MED ORDER — SODIUM CHLORIDE 0.9 % IV SOLN: INTRAVENOUS | Status: DC

## 2019-02-11 MED ORDER — ACETAMINOPHEN 325 MG PO TABS: 650.0000 mg | ORAL_TABLET | ORAL | Status: DC | PRN

## 2019-02-11 MED ORDER — IOHEXOL 350 MG/ML SOLN
INTRAVENOUS | Status: DC | PRN
  Administered 2019-02-11: 20 mL

## 2019-02-11 MED ORDER — AMIODARONE HCL 150 MG/3ML IV SOLN
INTRAVENOUS | Status: AC
  Filled 2019-02-11: qty 3

## 2019-02-11 MED ORDER — HEPARIN SODIUM (PORCINE) 1000 UNIT/ML IJ SOLN
INTRAMUSCULAR | Status: DC | PRN
  Administered 2019-02-11: 2000 [IU] via INTRAVENOUS

## 2019-02-11 MED ORDER — MIDAZOLAM HCL 2 MG/2ML IJ SOLN
INTRAMUSCULAR | Status: DC | PRN
  Administered 2019-02-11: 1 mg via INTRAVENOUS

## 2019-02-11 MED ORDER — LIDOCAINE HCL (PF) 1 % IJ SOLN
INTRAMUSCULAR | Status: DC | PRN
  Administered 2019-02-11 (×2): 2 mL

## 2019-02-11 MED ORDER — LABETALOL HCL 5 MG/ML IV SOLN: 10.0000 mg | INTRAVENOUS | Status: AC | PRN

## 2019-02-11 MED ORDER — AMIODARONE HCL 150 MG/3ML IV SOLN
INTRAVENOUS | Status: DC | PRN
  Administered 2019-02-11: 150 mg via INTRAVENOUS

## 2019-02-11 MED ORDER — FENTANYL CITRATE (PF) 100 MCG/2ML IJ SOLN
INTRAMUSCULAR | Status: DC | PRN
  Administered 2019-02-11: 25 ug via INTRAVENOUS

## 2019-02-11 MED ORDER — VERAPAMIL HCL 2.5 MG/ML IV SOLN
INTRAVENOUS | Status: AC
  Filled 2019-02-11: qty 2

## 2019-02-11 MED ORDER — FENTANYL CITRATE (PF) 100 MCG/2ML IJ SOLN
INTRAMUSCULAR | Status: AC
  Filled 2019-02-11: qty 2

## 2019-02-11 MED ORDER — EPINEPHRINE 1 MG/10ML IJ SOSY
PREFILLED_SYRINGE | INTRAMUSCULAR | Status: AC
  Filled 2019-02-11: qty 10

## 2019-02-11 MED ORDER — MILRINONE LACTATE IN DEXTROSE 20-5 MG/100ML-% IV SOLN
0.3750 ug/kg/min | INTRAVENOUS | Status: DC
  Administered 2019-02-11: 0.125 ug/kg/min via INTRAVENOUS
  Administered 2019-02-12 – 2019-02-13 (×2): 0.3 ug/kg/min via INTRAVENOUS
  Administered 2019-02-15 – 2019-02-16 (×3): 0.375 ug/kg/min via INTRAVENOUS
  Filled 2019-02-11 (×7): qty 100

## 2019-02-11 MED ORDER — SODIUM CHLORIDE (PF) 0.9 % IJ SOLN
INTRAMUSCULAR | Status: AC
  Filled 2019-02-11: qty 10

## 2019-02-11 MED ORDER — POTASSIUM CHLORIDE 10 MEQ/50ML IV SOLN
10.0000 meq | INTRAVENOUS | Status: AC
  Administered 2019-02-11 – 2019-02-12 (×6): 10 meq via INTRAVENOUS
  Filled 2019-02-11 (×6): qty 50

## 2019-02-11 SURGICAL SUPPLY — 11 items
CATH 5FR JL3.5 JR4 ANG PIG MP (CATHETERS) ×1 IMPLANT
CATH SWAN GANZ VIP 7.5F (CATHETERS) ×1 IMPLANT
DEVICE RAD COMP TR BAND LRG (VASCULAR PRODUCTS) ×1 IMPLANT
GLIDESHEATH SLEND SS 6F .021 (SHEATH) ×1 IMPLANT
GUIDEWIRE INQWIRE 1.5J.035X260 (WIRE) IMPLANT
INQWIRE 1.5J .035X260CM (WIRE) ×2
PACK CARDIAC CATHETERIZATION (CUSTOM PROCEDURE TRAY) ×2 IMPLANT
SHEATH PINNACLE 8F 10CM (SHEATH) ×1 IMPLANT
SHEATH PROBE COVER 6X72 (BAG) ×1 IMPLANT
SLEEVE REPOSITIONING LENGTH 30 (MISCELLANEOUS) ×1 IMPLANT
TRANSDUCER W/STOPCOCK (MISCELLANEOUS) ×2 IMPLANT

## 2019-02-11 NOTE — Consult Note (Signed)
[address]       [place] [address]             [phone]        Jon Martin [facility] Medical Record #[id number] Date of Birth: 1985/06/07  Referring: Dr Gala Romney Primary Care: Nonah Mattes, MD Primary Cardiologist:No primary care provider on file.   Chief Complaint- fatigue, DOE History of Present Illness:     Patient examined, images of CXR and echocardiogram personally reviewed and discussed with Cardiology for coordination of care.  34 yo AA male smoker with 2 recent hospital admissions for Heart Failure transferred from [facility] with nonischemic cardiomyopathy, EF .15-20.Has hx of Etoh and drug abuse. Because he is dependent on dobutamine he was transferred for eval of mechanical support. Echo shows dilated LV with 6.2cm diameter, mod/severeMR, mod TR, no AI, mod-severe RV dysfunction CXR on admit with mild interstitial edema, small effusions. Left, right heart cath pending .  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   Zubrod Score: At the time of surgery this patients most appropriate activity status/level should be described as: []     0    Normal activity, no symptoms []     1    Restricted in physical strenuous activity but ambulatory, able to do out light work []     2    Ambulatory and capable of self care, unable to do work activities, up and about                 more than 50%  Of the time                            []     3    Only limited self care, in bed greater than 50% of waking hours [x]     4    Completely disabled, no self care, confined to bed or chair []     5    Moribund  Past Medical History:  Diagnosis Date   Dilated cardiomyopathy (HCC)    Dilated cardiomyopathy (HCC) 02/2019   Polysubstance abuse (HCC)     Past Surgical History:  Procedure Laterality Date   NO PAST SURGERIES      Social History   Tobacco Use  Smoking Status Current Every Day Smoker   Types: Cigarettes    Smokeless Tobacco Never Used    Social History   Substance and Sexual Activity  Alcohol Use Yes  hx cocaine abuse   No Known Allergies  Current Facility-Administered Medications  Medication Dose Route Frequency Provider Last Rate Last Admin   [MAR Hold] 0.9 %  sodium chloride infusion  250 mL Intravenous PRN Precious Reel, MD   Stopped at 02/10/19 1600   0.9 %  sodium chloride infusion  250 mL Intravenous PRN Clegg, Amy D, NP       0.9 %  sodium chloride infusion   Intravenous Continuous Bensimhon, Bevelyn Buckles, MD       [MAR Hold] acetaminophen (TYLENOL) tablet 650 mg  650 mg Oral Q4H PRN Precious Reel, MD       [facility] Hold] ALPRAZolam Prudy Feeler) tablet 0.25 mg  0.25 mg Oral BID PRN Precious Reel, MD       [facility] Hold] cefTRIAXone (ROCEPHIN) 2 g in sodium chloride 0.9 % 100 mL IVPB  2 g Intravenous Q24H Bensimhon, Bevelyn Buckles, MD 200 mL/hr at 02/10/19 1426 2 g  at 02/10/19 1426   [MAR Hold] Chlorhexidine Gluconate Cloth 2 % PADS 6 each  6 each Topical Daily Bensimhon, Bevelyn Buckles, MD   6 each at 02/10/19 1441   [MAR Hold] dextromethorphan-guaiFENesin (MUCINEX DM) 30-600 MG per 12 hr tablet 1 tablet  1 tablet Oral BID Bensimhon, Bevelyn Buckles, MD   1 tablet at 02/11/19 0847   [MAR Hold] digoxin (LANOXIN) tablet 0.125 mg  0.125 mg Oral Daily Bensimhon, Bevelyn Buckles, MD   0.125 mg at 02/11/19 0847   DOBUTamine (DOBUTREX) infusion 4000 mcg/mL  7.5 mcg/kg/min Intravenous Continuous Clegg, Amy D, NP 6.32 mL/hr at 02/11/19 0600 7.5 mcg/kg/min at 02/11/19 0600   [MAR Hold] doxycycline (VIBRAMYCIN) 100 mg in sodium chloride 0.9 % 250 mL IVPB  100 mg Intravenous Q12H Bensimhon, Bevelyn Buckles, MD   Stopped at 02/11/19 0517   [MAR Hold] enoxaparin (LOVENOX) injection 40 mg  40 mg Subcutaneous Daily Precious Reel, MD   40 mg at 02/10/19 1036   [MAR Hold] furosemide (LASIX) injection 80 mg  80 mg Intravenous BID Bensimhon, Bevelyn Buckles, MD   80 mg at 02/11/19 0858   [MAR Hold]  ondansetron (ZOFRAN) injection 4 mg  4 mg Intravenous Q6H PRN Precious Reel, MD       [facility] Hold] sodium chloride flush (NS) 0.9 % injection 10-40 mL  10-40 mL Intracatheter Q12H Bensimhon, Bevelyn Buckles, MD   10 mL at 02/10/19 1359   [MAR Hold] sodium chloride flush (NS) 0.9 % injection 10-40 mL  10-40 mL Intracatheter PRN Bensimhon, Bevelyn Buckles, MD       [facility] Hold] sodium chloride flush (NS) 0.9 % injection 3 mL  3 mL Intravenous Q12H Precious Reel, MD   3 mL at 02/10/19 1052   [MAR Hold] sodium chloride flush (NS) 0.9 % injection 3 mL  3 mL Intravenous PRN Precious Reel, MD       [facility] Hold] sodium chloride flush (NS) 0.9 % injection 3 mL  3 mL Intravenous Q12H Clegg, Amy D, NP       sodium chloride flush (NS) 0.9 % injection 3 mL  3 mL Intravenous PRN Clegg, Amy D, NP       [MAR Hold] spironolactone (ALDACTONE) tablet 12.5 mg  12.5 mg Oral QHS Bensimhon, Bevelyn Buckles, MD   12.5 mg at 02/10/19 2110   [MAR Hold] zolpidem (AMBIEN) tablet 5 mg  5 mg Oral QHS PRN,MR X 1 Precious Reel, MD        No medications prior to admission.    Family History  Problem Relation Age of Onset   CAD Father      Review of Systems:   ROS  Right hand dominant No hx of chest trauma No prior general anesthesia No hx of bleeding or blood transfusion     Cardiac Review of Systems: Y or  [    ]= no  Chest Pain [    ]  Resting SOB [   ] Exertional SOB  [name]  ]  Orthopnea [ y ]   Pedal Edema [   ]    Palpitations [  ] Syncope  [  ]   Presyncope [ y  ]  General Review of Systems: [Y] = yes [  ]=no Constitional: recent weight change [  ]; anorexia [  ]; fatigue [name]  ]; nausea [name]  ]; night sweats [  ]; fever [  ]; or chills [  ]  Dental: Last Dentist visit: none  Eye : blurred vision [  ]; diplopia [   ]; vision changes [  ];  Amaurosis fugax[  ]; Resp: cough [  ];  wheezing[  ];  hemoptysis[  ]; shortness of breath[y  ];  paroxysmal nocturnal dyspnea[  ]; dyspnea on exertion[ y ]; or orthopnea[  ];  GI:  gallstones[  ], vomiting[  ];  dysphagia[  ]; melena[  ];  hematochezia [  ]; heartburn[  ];   Hx of  Colonoscopy[  ]; GU: kidney stones [  ]; hematuria[  ];   dysuria [  ];  nocturia[  ];  history of     obstruction [  ]; urinary frequency [  ]             Skin: rash, swelling[  ];, hair loss[  ];  peripheral edema[  ];  or itching[  ]; Musculosketetal: myalgias[  ];  joint swelling[  ];  joint erythema[  ];  joint pain[  ];  back pain[  ];  Heme/Lymph: bruising[  ];  bleeding[  ];  anemia[  ];  Neuro: TIA[  ];  headaches[  ];  stroke[  ];  vertigo[  ];  seizures[  ];   paresthesias[  ];  difficulty walking[  ];  Psych:depression[  ]; anxiety[  ];  Endocrine: diabetes[  ];  thyroid dysfunction[  ];                        Covid negative      Physical Exam: BP 100/84 (BP Location: Left Arm)    Pulse (!) 111    Temp 97.8 F (36.6 C) (Oral)    Resp (!) 23    Ht 5\' 8"  (1.727 m)    Wt 54.5 kg    SpO2 100%    BMI 18.27 kg/m       Physical Exam  General: Small thin 34 yo male breathing comfortably NAD HEENT: Normocephalic pupils equal , dentition adequate Neck: Supple without JVD, adenopathy, or bruit Chest: Clear to auscultation, symmetrical breath sounds, no rhonchi, no tenderness             or deformity Cardiovascular: Regular rate and rhythm, 2/6 MR murmur, +S3 gallop, peripheral pulses weak              in all extremities Abdomen:  Soft, nontender, no palpable mass or organomegaly Extremities: Warm, well-perfused, no clubbing cyanosis edema or tenderness,              no venous stasis changes of the legs Rectal/GU: Deferred Neuro: Grossly non--focal and symmetrical throughout Skin: Clean and dry without rash or ulceration     Diagnostic Studies & Laboratory data:     Recent Radiology Findings:   DG Chest 2 View  Result Date: 02/10/2019 CLINICAL DATA:  34 year old male with a history of  dyspnea and dry cough EXAM: CHEST - 2 VIEW COMPARISON:  None. FINDINGS: Cardiomediastinal silhouette within limits in size and contour. No pneumothorax. Meniscus in the costophrenic sulcus on the lateral view. Patchy airspace opacities bilateral lungs. IMPRESSION: Multifocal pneumonia, with apparent parapneumonic effusion. Electronically Signed   By: Corrie Mckusick D.O.   On: 02/10/2019 10:11   ECHOCARDIOGRAM COMPLETE  Result Date: 02/10/2019   ECHOCARDIOGRAM REPORT   Patient Name:   Jon Martin Date of Exam: 02/10/2019 Medical Rec #:  [id number]       Height:  68.0 in Accession #:    [id number]      Weight:       123.8 lb Date of Birth:  09-02-1985        BSA:          1.67 m Patient Age:    33 years        BP:           99/73 mmHg Patient Gender: M               HR:           118 bpm. Exam Location:  Inpatient Procedure: 2D Echo, 3D Echo, Color Doppler and Cardiac Doppler Indications:    I50.9* Heart failure (unspecified)  History:        Patient has no prior history of Echocardiogram examinations.                 ETOH Abuse.  Sonographer:    Irving Burton Senior RDCS Referring Phys: [phone] AMY D CLEGG IMPRESSIONS  1. Left ventricular ejection fraction, by visual estimation, is <20%. The left ventricle has severely decreased function. There is no left ventricular hypertrophy.  2. The left ventricle demonstrates global hypokinesis.  3. Severely dilated left ventricular internal cavity size.  4. Left ventricular diastolic parameters are consistent with Grade III diastolic dysfunction (restrictive).  5. Elevated left atrial pressure.  6. Global right ventricle has normal systolic function.The right ventricular size is normal.  7. Left atrial size was severely dilated.  8. Right atrial size was normal.  9. The mitral valve is abnormal. Severe mitral valve regurgitation. 10. The tricuspid valve is abnormal. Moderate to severe tricuspid regurgitation 11. The aortic valve is tricuspid. Aortic valve regurgitation is not  visualized. No evidence of aortic valve stenosis. 12. The pulmonic valve was not well visualized. Pulmonic valve regurgitation is not visualized. 13. The inferior vena cava is dilated in size with <50% respiratory variability, suggesting right atrial pressure of 15 mmHg. 14. The tricuspid regurgitant velocity is 2.97 m/s, and with an assumed right atrial pressure of 15 mmHg, the estimated right ventricular systolic pressure is moderately elevated at 50.3 mmHg. FINDINGS  Left Ventricle: Left ventricular ejection fraction, by visual estimation, is <20%. The left ventricle has severely decreased function. The left ventricle demonstrates global hypokinesis. The left ventricular internal cavity size was severely dilated left ventricle. There is no left ventricular hypertrophy. Left ventricular diastolic parameters are consistent with Grade III diastolic dysfunction (restrictive). Elevated left atrial pressure. Right Ventricle: The right ventricular size is normal. No increase in right ventricular wall thickness. Global RV systolic function is has normal systolic function. The tricuspid regurgitant velocity is 2.97 m/s, and with an assumed right atrial pressure  of 15 mmHg, the estimated right ventricular systolic pressure is moderately elevated at 50.3 mmHg. Left Atrium: Left atrial size was severely dilated. Right Atrium: Right atrial size was normal in size Pericardium: There is no evidence of pericardial effusion. There is a moderate pleural effusion in the left lateral region. Mitral Valve: The mitral valve is abnormal. Moderate to severe mitral valve regurgitation. Tricuspid Valve: The tricuspid valve is abnormal. Tricuspid valve regurgitation moderate-severe. Aortic Valve: The aortic valve is tricuspid. Aortic valve regurgitation is not visualized. The aortic valve is structurally normal, with no evidence of sclerosis or stenosis. Pulmonic Valve: The pulmonic valve was not well visualized. Pulmonic valve  regurgitation is not visualized. Pulmonic regurgitation is not visualized. Aorta: The aortic root and ascending aorta are structurally normal,  with no evidence of dilitation. Venous: The inferior vena cava is dilated in size with less than 50% respiratory variability, suggesting right atrial pressure of 15 mmHg. IAS/Shunts: The interatrial septum was not well visualized.  LEFT VENTRICLE PLAX 2D LVIDd:         6.40 cm       Diastology LVIDs:         6.00 cm       LV e' lateral:   9.90 cm/s LV PW:         1.00 cm       LV E/e' lateral: 13.2 LV IVS:        0.60 cm       LV e' medial:    6.42 cm/s LVOT diam:     1.80 cm       LV E/e' medial:  20.4 LV SV:         29 ml LV SV Index:   17.48 LVOT Area:     2.54 cm  LV Volumes (MOD) LV area d, A2C:    49.80 cm LV area d, A4C:    54.30 cm LV area s, A2C:    45.50 cm LV area s, A4C:    46.80 cm LV major d, A2C:   9.95 cm LV major d, A4C:   10.40 cm LV major s, A2C:   9.63 cm LV major s, A4C:   9.40 cm LV vol d, MOD A2C: 210.0 ml LV vol d, MOD A4C: 238.0 ml LV vol s, MOD A2C: 178.0 ml LV vol s, MOD A4C: 195.0 ml LV SV MOD A2C:     32.0 ml LV SV MOD A4C:     238.0 ml LV SV MOD BP:      40.2 ml RIGHT VENTRICLE RV S prime:     10.40 cm/s TAPSE (M-mode): 1.8 cm LEFT ATRIUM              Index       RIGHT ATRIUM           Index LA diam:        4.80 cm  2.88 cm/m  RA Area:     15.30 cm LA Vol (A2C):   106.0 ml 63.59 ml/m RA Volume:   37.80 ml  22.68 ml/m LA Vol (A4C):   121.0 ml 72.59 ml/m LA Biplane Vol: 115.0 ml 68.99 ml/m  AORTIC VALVE LVOT Vmax:   39.20 cm/s LVOT Vmean:  31.000 cm/s LVOT VTI:    0.053 m  AORTA Ao Root diam: 2.50 cm Ao Asc diam:  2.20 cm MITRAL VALVE                         TRICUSPID VALVE MV Area (PHT): 6.65 cm              TR Peak grad:   35.3 mmHg MV PHT:        33.06 msec            TR Vmax:        297.00 cm/s MV Decel Time: 114 msec MR Peak grad:    76.0 mmHg           SHUNTS MR Mean grad:    45.0 mmHg           Systemic VTI:  0.05 m MR Vmax:          436.00 cm/s  Systemic Diam: 1.80 cm MR Vmean:        313.0 cm/s MR PISA:         5.09 cm MR PISA Eff ROA: 47 mm MR PISA Radius:  0.90 cm MV E velocity: 131.00 cm/s 103 cm/s MV A velocity: 41.00 cm/s  70.3 cm/s MV E/A ratio:  3.20        1.5  Epifanio Lesches MD Electronically signed by Epifanio Lesches MD Signature Date/Time: 02/10/2019/4:56:45 PM    Final    [place] EKG SITE RITE  Result Date: 02/10/2019 If Site Rite image not attached, placement could not be confirmed due to current cardiac rhythm.    I have independently reviewed the above radiologic studies and discussed with the patient   Recent Lab Findings: Lab Results  Component Value Date   WBC 9.0 02/10/2019   HGB 12.1 (L) 02/10/2019   HCT 35.6 (L) 02/10/2019   PLT 226 02/10/2019   GLUCOSE 146 (H) 02/11/2019   CHOL 89 02/11/2019   TRIG 58 02/11/2019   HDL 25 (L) 02/11/2019   LDLCALC 52 02/11/2019   ALT 34 02/10/2019   AST 33 02/10/2019   NA 133 (L) 02/11/2019   K 3.7 02/11/2019   CL 98 02/11/2019   CREATININE 1.14 02/11/2019   BUN 13 02/11/2019   CO2 25 02/11/2019   INR 1.2 02/10/2019   HGBA1C 5.2 02/11/2019      Assessment / Plan:     Non- ischemic cardiomyopathy with low cardiac output  Hx of Etoh, drug abuse  Patient has been recommended for temporary LVAD support with trans axillary Impella if cardiogenic shock confirmed by RHC. I have discussed the procedure with the patient who understands the benefits and risks of surgery  Kerin Perna, MD    02/11/2019 9:49 AM

## 2019-02-11 NOTE — Progress Notes (Signed)
Patient with excessive coughing and c/o nausea since 1530. Medicated with prn zofran - intervention not effective. Patient states he's miserable and the combination of coughing/nausea is making his abdomen hurt. Dr. Gala Romney noted. He stated since patient has converted back to NSR 100-110, I can turn off the amio and see if that helps with the nausea. Patient informed of the above. New orders also received for lasix and mucinex. Dr. Gala Romney stated he would be around later to see the patient again.  Leanna Battles, RN

## 2019-02-11 NOTE — H&P (View-Only) (Signed)
Advanced Heart Failure Rounding Note  PCP-Cardiologist: No primary care provider on file.   Subjective:    Yesterday dobutamine increased to 7.5 mcg for persistently low CO-OX  Today CO-OX is 21%   Denies SOB.  Objective:   Weight Range: 54.5 kg Body mass index is 18.27 kg/m.   Vital Signs:   Temp:  [97.6 F (36.4 C)-98.6 F (37 C)] 98.6 F (37 C) (01/07 0306) Pulse Rate:  [106-115] 108 (01/07 0600) Resp:  [13-25] 13 (01/07 0600) BP: (91-102)/(60-73) 91/60 (01/07 0306) SpO2:  [94 %-100 %] 100 % (01/07 0600) Weight:  [54.5 kg] 54.5 kg (01/07 0306) Last BM Date: 02/09/19  Weight change: Filed Weights   02/10/19 0024 02/10/19 0409 02/11/19 0306  Weight: 56.2 kg 56.2 kg 54.5 kg    Intake/Output:   Intake/Output Summary (Last 24 hours) at 02/11/2019 0809 Last data filed at 02/11/2019 0600 Gross per 24 hour  Intake 866.99 ml  Output 1700 ml  Net -833.01 ml      Physical Exam   General:  Thin . No resp difficulty HEENT: normal Neck: supple. JVP 10-11. Carotids 2+ bilat; no bruits. No lymphadenopathy or thryomegaly appreciated. Cor: PMI nondisplaced. Regular rate & rhythm. No rubs, or murmurs. +S3 Lungs: clear Abdomen: soft, nontender, nondistended. No hepatosplenomegaly. No bruits or masses. Good bowel sounds. Extremities: no cyanosis, clubbing, rash, edema. RUE PICC  Neuro: alert & orientedx3, cranial nerves grossly intact. moves all 4 extremities w/o difficulty. Affect pleasant   Telemetry  Sinus Tach 110s   EKG  n/a  Labs    CBC Recent Labs    02/10/19 0219  WBC 9.0  NEUTROABS 5.2  HGB 12.1*  HCT 35.6*  MCV 97.8  PLT 101   Basic Metabolic Panel Recent Labs    02/10/19 0219 02/11/19 0319  NA 131* 133*  K 4.4 3.7  CL 100 98  CO2 21* 25  GLUCOSE 129* 146*  BUN 10 13  CREATININE 1.18 1.14  CALCIUM 8.7* 8.4*  MG 1.7 1.7   Liver Function Tests Recent Labs    02/10/19 0219  AST 33  ALT 34  ALKPHOS 64  BILITOT 1.9*  PROT 6.1*    ALBUMIN 2.8*   No results for input(s): LIPASE, AMYLASE in the last 72 hours. Cardiac Enzymes No results for input(s): CKTOTAL, CKMB, CKMBINDEX, TROPONINI in the last 72 hours.  BNP: BNP (last 3 results) Recent Labs    02/10/19 0219  BNP 2,048.6*    ProBNP (last 3 results) No results for input(s): PROBNP in the last 8760 hours.   D-Dimer No results for input(s): DDIMER in the last 72 hours. Hemoglobin A1C Recent Labs    02/11/19 0319  HGBA1C 5.2   Fasting Lipid Panel Recent Labs    02/11/19 0319  CHOL 89  HDL 25*  LDLCALC 52  TRIG 58  CHOLHDL 3.6   Thyroid Function Tests No results for input(s): TSH, T4TOTAL, T3FREE, THYROIDAB in the last 72 hours.  Invalid input(s): FREET3  Other results:   Imaging    DG Chest 2 View  Result Date: 02/10/2019 CLINICAL DATA:  34 year old male with a history of dyspnea and dry cough EXAM: CHEST - 2 VIEW COMPARISON:  None. FINDINGS: Cardiomediastinal silhouette within limits in size and contour. No pneumothorax. Meniscus in the costophrenic sulcus on the lateral view. Patchy airspace opacities bilateral lungs. IMPRESSION: Multifocal pneumonia, with apparent parapneumonic effusion. Electronically Signed   By: Corrie Mckusick D.O.   On: 02/10/2019 10:11  ECHOCARDIOGRAM COMPLETE  Result Date: 02/10/2019   ECHOCARDIOGRAM REPORT   Patient Name:   Jon Martin Date of Exam: 02/10/2019 Medical Rec #:  [id number]       Height:       68.0 in Accession #:    [id number]      Weight:       123.8 lb Date of Birth:  1985/11/25        BSA:          1.67 m Patient Age:    34 years        BP:           99/73 mmHg Patient Gender: M               HR:           118 bpm. Exam Location:  Inpatient Procedure: 2D Echo, 3D Echo, Color Doppler and Cardiac Doppler Indications:    I50.9* Heart failure (unspecified)  History:        Patient has no prior history of Echocardiogram examinations.                 ETOH Abuse.  Sonographer:    Irving Burton Senior RDCS Referring  Phys: [phone] AMY D CLEGG IMPRESSIONS  1. Left ventricular ejection fraction, by visual estimation, is <20%. The left ventricle has severely decreased function. There is no left ventricular hypertrophy.  2. The left ventricle demonstrates global hypokinesis.  3. Severely dilated left ventricular internal cavity size.  4. Left ventricular diastolic parameters are consistent with Grade III diastolic dysfunction (restrictive).  5. Elevated left atrial pressure.  6. Global right ventricle has normal systolic function.The right ventricular size is normal.  7. Left atrial size was severely dilated.  8. Right atrial size was normal.  9. The mitral valve is abnormal. Severe mitral valve regurgitation. 10. The tricuspid valve is abnormal. Moderate to severe tricuspid regurgitation 11. The aortic valve is tricuspid. Aortic valve regurgitation is not visualized. No evidence of aortic valve stenosis. 12. The pulmonic valve was not well visualized. Pulmonic valve regurgitation is not visualized. 13. The inferior vena cava is dilated in size with <50% respiratory variability, suggesting right atrial pressure of 15 mmHg. 14. The tricuspid regurgitant velocity is 2.97 m/s, and with an assumed right atrial pressure of 15 mmHg, the estimated right ventricular systolic pressure is moderately elevated at 50.3 mmHg. FINDINGS  Left Ventricle: Left ventricular ejection fraction, by visual estimation, is <20%. The left ventricle has severely decreased function. The left ventricle demonstrates global hypokinesis. The left ventricular internal cavity size was severely dilated left ventricle. There is no left ventricular hypertrophy. Left ventricular diastolic parameters are consistent with Grade III diastolic dysfunction (restrictive). Elevated left atrial pressure. Right Ventricle: The right ventricular size is normal. No increase in right ventricular wall thickness. Global RV systolic function is has normal systolic function. The tricuspid  regurgitant velocity is 2.97 m/s, and with an assumed right atrial pressure  of 15 mmHg, the estimated right ventricular systolic pressure is moderately elevated at 50.3 mmHg. Left Atrium: Left atrial size was severely dilated. Right Atrium: Right atrial size was normal in size Pericardium: There is no evidence of pericardial effusion. There is a moderate pleural effusion in the left lateral region. Mitral Valve: The mitral valve is abnormal. Moderate to severe mitral valve regurgitation. Tricuspid Valve: The tricuspid valve is abnormal. Tricuspid valve regurgitation moderate-severe. Aortic Valve: The aortic valve is tricuspid. Aortic valve regurgitation is not visualized. The aortic valve  is structurally normal, with no evidence of sclerosis or stenosis. Pulmonic Valve: The pulmonic valve was not well visualized. Pulmonic valve regurgitation is not visualized. Pulmonic regurgitation is not visualized. Aorta: The aortic root and ascending aorta are structurally normal, with no evidence of dilitation. Venous: The inferior vena cava is dilated in size with less than 50% respiratory variability, suggesting right atrial pressure of 15 mmHg. IAS/Shunts: The interatrial septum was not well visualized.  LEFT VENTRICLE PLAX 2D LVIDd:         6.40 cm       Diastology LVIDs:         6.00 cm       LV e' lateral:   9.90 cm/s LV PW:         1.00 cm       LV E/e' lateral: 13.2 LV IVS:        0.60 cm       LV e' medial:    6.42 cm/s LVOT diam:     1.80 cm       LV E/e' medial:  20.4 LV SV:         29 ml LV SV Index:   17.48 LVOT Area:     2.54 cm  LV Volumes (MOD) LV area d, A2C:    49.80 cm LV area d, A4C:    54.30 cm LV area s, A2C:    45.50 cm LV area s, A4C:    46.80 cm LV major d, A2C:   9.95 cm LV major d, A4C:   10.40 cm LV major s, A2C:   9.63 cm LV major s, A4C:   9.40 cm LV vol d, MOD A2C: 210.0 ml LV vol d, MOD A4C: 238.0 ml LV vol s, MOD A2C: 178.0 ml LV vol s, MOD A4C: 195.0 ml LV SV MOD A2C:     32.0 ml LV SV  MOD A4C:     238.0 ml LV SV MOD BP:      40.2 ml RIGHT VENTRICLE RV S prime:     10.40 cm/s TAPSE (M-mode): 1.8 cm LEFT ATRIUM              Index       RIGHT ATRIUM           Index LA diam:        4.80 cm  2.88 cm/m  RA Area:     15.30 cm LA Vol (A2C):   106.0 ml 63.59 ml/m RA Volume:   37.80 ml  22.68 ml/m LA Vol (A4C):   121.0 ml 72.59 ml/m LA Biplane Vol: 115.0 ml 68.99 ml/m  AORTIC VALVE LVOT Vmax:   39.20 cm/s LVOT Vmean:  31.000 cm/s LVOT VTI:    0.053 m  AORTA Ao Root diam: 2.50 cm Ao Asc diam:  2.20 cm MITRAL VALVE                         TRICUSPID VALVE MV Area (PHT): 6.65 cm              TR Peak grad:   35.3 mmHg MV PHT:        33.06 msec            TR Vmax:        297.00 cm/s MV Decel Time: 114 msec MR Peak grad:    76.0 mmHg           SHUNTS MR Mean grad:      45.0 mmHg           Systemic VTI:  0.05 m MR Vmax:         436.00 cm/s         Systemic Diam: 1.80 cm MR Vmean:        313.0 cm/s MR PISA:         5.09 cm MR PISA Eff ROA: 47 mm MR PISA Radius:  0.90 cm MV E velocity: 131.00 cm/s 103 cm/s MV A velocity: 41.00 cm/s  70.3 cm/s MV E/A ratio:  3.20        1.5  Epifanio Lesches MD Electronically signed by Epifanio Lesches MD Signature Date/Time: 02/10/2019/4:56:45 PM    Final    [place] EKG SITE RITE  Result Date: 02/10/2019 If Site Rite image not attached, placement could not be confirmed due to current cardiac rhythm.    Medications:     Scheduled Medications: . aspirin  81 mg Oral Pre-Cath  . Chlorhexidine Gluconate Cloth  6 each Topical Daily  . dextromethorphan-guaiFENesin  1 tablet Oral BID  . digoxin  0.125 mg Oral Daily  . enoxaparin (LOVENOX) injection  40 mg Subcutaneous Daily  . furosemide  80 mg Intravenous BID  . sodium chloride flush  10-40 mL Intracatheter Q12H  . sodium chloride flush  3 mL Intravenous Q12H  . sodium chloride flush  3 mL Intravenous Q12H  . spironolactone  12.5 mg Oral QHS    Infusions: . sodium chloride Stopped (02/10/19 1600)  . sodium  chloride    . [START ON 02/12/2019] sodium chloride    . cefTRIAXone (ROCEPHIN)  IV 2 g (02/10/19 1426)  . DOBUTamine 7.5 mcg/kg/min (02/11/19 0600)  . doxycycline (VIBRAMYCIN) IV Stopped (02/11/19 0517)    PRN Medications: sodium chloride, sodium chloride, acetaminophen, ALPRAZolam, ondansetron (ZOFRAN) IV, sodium chloride flush, sodium chloride flush, sodium chloride flush, zolpidem     Assessment/Plan   1. Acute/Chronic Systolic HF  ECHO at [facility] EF , 20%.  ECHO repeated and showed severely reduced EF <20%.  CO-OX remains low 21% on dobutamine 7.5 cmg.  - To cath lab today for RHC.LHC with swan. May need mechanical support.   - Continue digoxin 0.125 mg daily.  - Continue 12.5 mg spiro daily.  -Consult CT surgery and VAD team.   2. Cough CXR- ? PNA.  - Procalcitonin 0.38>0.44.  - Treating rocephin+ doxycycline  - WBC 9 on admit.  - Covid neg.   3. ETOH Abuse Drinks alcohol daily. ~6 pack per day.   4. Cocaine  UDS was + at [facility]  He denies cocaine use.    Length of Stay: 2  [name], NP  02/11/2019, 8:09 AM  Advanced Heart Failure Team Pager [phone] (M-F; 7a - 4p)  Please contact [facility] for night-coverage after hours (4p -7a ) and weekends on [email or web address]  Agree with above  He is now on dobutamine at 7.5. MV sats still in 20s c/w profound cardiogenic shock. Despite that says his breathing feels better this am. Less cough.   On exam Weak JVP to jaw Regular and tachy with s3 Lungs decreased at bases Ab soft NT Ext cool  Numbers c/w with profound cardiogenic shock despite high-dose dobutamine support. Will take emergently to cath lab for [facility] and then move to ICU. D/w Dr. Norina Buzzard. If hemodynamics poor on RHC plan Impella 5.5 placement this afternoon with possible eye to durable LVAD under Intermacs-1 criteria.   CRITICAL CARE Performed by: Arvilla Meres  Total critical care time: 35 minutes  Critical care time was exclusive of  separately billable procedures and treating other patients.  Critical care was necessary to treat or prevent imminent or life-threatening deterioration.  Critical care was time spent personally by me (independent of midlevel providers or residents) on the following activities: development of treatment plan with patient and/or surrogate as well as nursing, discussions with consultants, evaluation of patient's response to treatment, examination of patient, obtaining history from patient or surrogate, ordering and performing treatments and interventions, ordering and review of laboratory studies, ordering and review of radiographic studies, pulse oximetry and re-evaluation of patient's condition.  Arvilla Meres, MD  9:50 AM

## 2019-02-11 NOTE — Progress Notes (Signed)
   Patient remains on DBA at 5. Having severe couging fits throughout the day.  Now back in NSR with amio. IV amio stopped.   Swan numbers doen with RN and co-ox ran and repeat co-ox ranging 29-42% c/w persistent shock.   Will add milrinone 0.125.  May need to re-consider Impella 5.0 in am  Additional CCT 40 mins .  Arvilla Meres, MD  8:10 PM

## 2019-02-11 NOTE — Interval H&P Note (Signed)
History and Physical Interval Note:  02/11/2019 10:04 AM  Jon Martin  has presented today for surgery, with the diagnosis of Heart Failure Cardiogenic Shock.  The various methods of treatment have been discussed with the patient and family. After consideration of risks, benefits and other options for treatment, the patient has consented to  Procedure(s): RIGHT/LEFT HEART CATH AND CORONARY ANGIOGRAPHY (N/A) as a surgical intervention.  The patient's history has been reviewed, patient examined, no change in status, stable for surgery.  I have reviewed the patient's chart and labs.  Questions were answered to the patient's satisfaction.     Aliya Sol

## 2019-02-11 NOTE — Progress Notes (Addendum)
Advanced Heart Failure Rounding Note  PCP-Cardiologist: No primary care provider on file.   Subjective:    Yesterday dobutamine increased to 7.5 mcg for persistently low CO-OX  Today CO-OX is 21%   Denies SOB.  Objective:   Weight Range: 54.5 kg Body mass index is 18.27 kg/m.   Vital Signs:   Temp:  [97.6 F (36.4 C)-98.6 F (37 C)] 98.6 F (37 C) (01/07 0306) Pulse Rate:  [106-115] 108 (01/07 0600) Resp:  [13-25] 13 (01/07 0600) BP: (91-102)/(60-73) 91/60 (01/07 0306) SpO2:  [94 %-100 %] 100 % (01/07 0600) Weight:  [54.5 kg] 54.5 kg (01/07 0306) Last BM Date: 02/09/19  Weight change: Filed Weights   02/10/19 0024 02/10/19 0409 02/11/19 0306  Weight: 56.2 kg 56.2 kg 54.5 kg    Intake/Output:   Intake/Output Summary (Last 24 hours) at 02/11/2019 0809 Last data filed at 02/11/2019 0600 Gross per 24 hour  Intake 866.99 ml  Output 1700 ml  Net -833.01 ml      Physical Exam   General:  Thin . No resp difficulty HEENT: normal Neck: supple. JVP 10-11. Carotids 2+ bilat; no bruits. No lymphadenopathy or thryomegaly appreciated. Cor: PMI nondisplaced. Regular rate & rhythm. No rubs, or murmurs. +S3 Lungs: clear Abdomen: soft, nontender, nondistended. No hepatosplenomegaly. No bruits or masses. Good bowel sounds. Extremities: no cyanosis, clubbing, rash, edema. RUE PICC  Neuro: alert & orientedx3, cranial nerves grossly intact. moves all 4 extremities w/o difficulty. Affect pleasant   Telemetry  Sinus Tach 110s   EKG  n/a  Labs    CBC Recent Labs    02/10/19 0219  WBC 9.0  NEUTROABS 5.2  HGB 12.1*  HCT 35.6*  MCV 97.8  PLT 101   Basic Metabolic Panel Recent Labs    02/10/19 0219 02/11/19 0319  NA 131* 133*  K 4.4 3.7  CL 100 98  CO2 21* 25  GLUCOSE 129* 146*  BUN 10 13  CREATININE 1.18 1.14  CALCIUM 8.7* 8.4*  MG 1.7 1.7   Liver Function Tests Recent Labs    02/10/19 0219  AST 33  ALT 34  ALKPHOS 64  BILITOT 1.9*  PROT 6.1*    ALBUMIN 2.8*   No results for input(s): LIPASE, AMYLASE in the last 72 hours. Cardiac Enzymes No results for input(s): CKTOTAL, CKMB, CKMBINDEX, TROPONINI in the last 72 hours.  BNP: BNP (last 3 results) Recent Labs    02/10/19 0219  BNP 2,048.6*    ProBNP (last 3 results) No results for input(s): PROBNP in the last 8760 hours.   D-Dimer No results for input(s): DDIMER in the last 72 hours. Hemoglobin A1C Recent Labs    02/11/19 0319  HGBA1C 5.2   Fasting Lipid Panel Recent Labs    02/11/19 0319  CHOL 89  HDL 25*  LDLCALC 52  TRIG 58  CHOLHDL 3.6   Thyroid Function Tests No results for input(s): TSH, T4TOTAL, T3FREE, THYROIDAB in the last 72 hours.  Invalid input(s): FREET3  Other results:   Imaging    DG Chest 2 View  Result Date: 02/10/2019 CLINICAL DATA:  34 year old male with a history of dyspnea and dry cough EXAM: CHEST - 2 VIEW COMPARISON:  None. FINDINGS: Cardiomediastinal silhouette within limits in size and contour. No pneumothorax. Meniscus in the costophrenic sulcus on the lateral view. Patchy airspace opacities bilateral lungs. IMPRESSION: Multifocal pneumonia, with apparent parapneumonic effusion. Electronically Signed   By: Corrie Mckusick D.O.   On: 02/10/2019 10:11  ECHOCARDIOGRAM COMPLETE  Result Date: 02/10/2019   ECHOCARDIOGRAM REPORT   Patient Name:   Jon Martin Date of Exam: 02/10/2019 Medical Rec #:  [id number]       Height:       68.0 in Accession #:    [id number]      Weight:       123.8 lb Date of Birth:  1985/11/25        BSA:          1.67 m Patient Age:    33 years        BP:           99/73 mmHg Patient Gender: M               HR:           118 bpm. Exam Location:  Inpatient Procedure: 2D Echo, 3D Echo, Color Doppler and Cardiac Doppler Indications:    I50.9* Heart failure (unspecified)  History:        Patient has no prior history of Echocardiogram examinations.                 ETOH Abuse.  Sonographer:    Irving Burton Senior RDCS Referring  Phys: [phone] AMY D CLEGG IMPRESSIONS  1. Left ventricular ejection fraction, by visual estimation, is <20%. The left ventricle has severely decreased function. There is no left ventricular hypertrophy.  2. The left ventricle demonstrates global hypokinesis.  3. Severely dilated left ventricular internal cavity size.  4. Left ventricular diastolic parameters are consistent with Grade III diastolic dysfunction (restrictive).  5. Elevated left atrial pressure.  6. Global right ventricle has normal systolic function.The right ventricular size is normal.  7. Left atrial size was severely dilated.  8. Right atrial size was normal.  9. The mitral valve is abnormal. Severe mitral valve regurgitation. 10. The tricuspid valve is abnormal. Moderate to severe tricuspid regurgitation 11. The aortic valve is tricuspid. Aortic valve regurgitation is not visualized. No evidence of aortic valve stenosis. 12. The pulmonic valve was not well visualized. Pulmonic valve regurgitation is not visualized. 13. The inferior vena cava is dilated in size with <50% respiratory variability, suggesting right atrial pressure of 15 mmHg. 14. The tricuspid regurgitant velocity is 2.97 m/s, and with an assumed right atrial pressure of 15 mmHg, the estimated right ventricular systolic pressure is moderately elevated at 50.3 mmHg. FINDINGS  Left Ventricle: Left ventricular ejection fraction, by visual estimation, is <20%. The left ventricle has severely decreased function. The left ventricle demonstrates global hypokinesis. The left ventricular internal cavity size was severely dilated left ventricle. There is no left ventricular hypertrophy. Left ventricular diastolic parameters are consistent with Grade III diastolic dysfunction (restrictive). Elevated left atrial pressure. Right Ventricle: The right ventricular size is normal. No increase in right ventricular wall thickness. Global RV systolic function is has normal systolic function. The tricuspid  regurgitant velocity is 2.97 m/s, and with an assumed right atrial pressure  of 15 mmHg, the estimated right ventricular systolic pressure is moderately elevated at 50.3 mmHg. Left Atrium: Left atrial size was severely dilated. Right Atrium: Right atrial size was normal in size Pericardium: There is no evidence of pericardial effusion. There is a moderate pleural effusion in the left lateral region. Mitral Valve: The mitral valve is abnormal. Moderate to severe mitral valve regurgitation. Tricuspid Valve: The tricuspid valve is abnormal. Tricuspid valve regurgitation moderate-severe. Aortic Valve: The aortic valve is tricuspid. Aortic valve regurgitation is not visualized. The aortic valve  is structurally normal, with no evidence of sclerosis or stenosis. Pulmonic Valve: The pulmonic valve was not well visualized. Pulmonic valve regurgitation is not visualized. Pulmonic regurgitation is not visualized. Aorta: The aortic root and ascending aorta are structurally normal, with no evidence of dilitation. Venous: The inferior vena cava is dilated in size with less than 50% respiratory variability, suggesting right atrial pressure of 15 mmHg. IAS/Shunts: The interatrial septum was not well visualized.  LEFT VENTRICLE PLAX 2D LVIDd:         6.40 cm       Diastology LVIDs:         6.00 cm       LV e' lateral:   9.90 cm/s LV PW:         1.00 cm       LV E/e' lateral: 13.2 LV IVS:        0.60 cm       LV e' medial:    6.42 cm/s LVOT diam:     1.80 cm       LV E/e' medial:  20.4 LV SV:         29 ml LV SV Index:   17.48 LVOT Area:     2.54 cm  LV Volumes (MOD) LV area d, A2C:    49.80 cm LV area d, A4C:    54.30 cm LV area s, A2C:    45.50 cm LV area s, A4C:    46.80 cm LV major d, A2C:   9.95 cm LV major d, A4C:   10.40 cm LV major s, A2C:   9.63 cm LV major s, A4C:   9.40 cm LV vol d, MOD A2C: 210.0 ml LV vol d, MOD A4C: 238.0 ml LV vol s, MOD A2C: 178.0 ml LV vol s, MOD A4C: 195.0 ml LV SV MOD A2C:     32.0 ml LV SV  MOD A4C:     238.0 ml LV SV MOD BP:      40.2 ml RIGHT VENTRICLE RV S prime:     10.40 cm/s TAPSE (M-mode): 1.8 cm LEFT ATRIUM              Index       RIGHT ATRIUM           Index LA diam:        4.80 cm  2.88 cm/m  RA Area:     15.30 cm LA Vol (A2C):   106.0 ml 63.59 ml/m RA Volume:   37.80 ml  22.68 ml/m LA Vol (A4C):   121.0 ml 72.59 ml/m LA Biplane Vol: 115.0 ml 68.99 ml/m  AORTIC VALVE LVOT Vmax:   39.20 cm/s LVOT Vmean:  31.000 cm/s LVOT VTI:    0.053 m  AORTA Ao Root diam: 2.50 cm Ao Asc diam:  2.20 cm MITRAL VALVE                         TRICUSPID VALVE MV Area (PHT): 6.65 cm              TR Peak grad:   35.3 mmHg MV PHT:        33.06 msec            TR Vmax:        297.00 cm/s MV Decel Time: 114 msec MR Peak grad:    76.0 mmHg           SHUNTS MR Mean grad:  45.0 mmHg           Systemic VTI:  0.05 m MR Vmax:         436.00 cm/s         Systemic Diam: 1.80 cm MR Vmean:        313.0 cm/s MR PISA:         5.09 cm MR PISA Eff ROA: 47 mm MR PISA Radius:  0.90 cm MV E velocity: 131.00 cm/s 103 cm/s MV A velocity: 41.00 cm/s  70.3 cm/s MV E/A ratio:  3.20        1.5  Epifanio Lesches MD Electronically signed by Epifanio Lesches MD Signature Date/Time: 02/10/2019/4:56:45 PM    Final    [place] EKG SITE RITE  Result Date: 02/10/2019 If Site Rite image not attached, placement could not be confirmed due to current cardiac rhythm.    Medications:     Scheduled Medications: . aspirin  81 mg Oral Pre-Cath  . Chlorhexidine Gluconate Cloth  6 each Topical Daily  . dextromethorphan-guaiFENesin  1 tablet Oral BID  . digoxin  0.125 mg Oral Daily  . enoxaparin (LOVENOX) injection  40 mg Subcutaneous Daily  . furosemide  80 mg Intravenous BID  . sodium chloride flush  10-40 mL Intracatheter Q12H  . sodium chloride flush  3 mL Intravenous Q12H  . sodium chloride flush  3 mL Intravenous Q12H  . spironolactone  12.5 mg Oral QHS    Infusions: . sodium chloride Stopped (02/10/19 1600)  . sodium  chloride    . [START ON 02/12/2019] sodium chloride    . cefTRIAXone (ROCEPHIN)  IV 2 g (02/10/19 1426)  . DOBUTamine 7.5 mcg/kg/min (02/11/19 0600)  . doxycycline (VIBRAMYCIN) IV Stopped (02/11/19 0517)    PRN Medications: sodium chloride, sodium chloride, acetaminophen, ALPRAZolam, ondansetron (ZOFRAN) IV, sodium chloride flush, sodium chloride flush, sodium chloride flush, zolpidem     Assessment/Plan   1. Acute/Chronic Systolic HF  ECHO at [facility] EF , 20%.  ECHO repeated and showed severely reduced EF <20%.  CO-OX remains low 21% on dobutamine 7.5 cmg.  - To cath lab today for RHC.LHC with swan. May need mechanical support.   - Continue digoxin 0.125 mg daily.  - Continue 12.5 mg spiro daily.  -Consult CT surgery and VAD team.   2. Cough CXR- ? PNA.  - Procalcitonin 0.38>0.44.  - Treating rocephin+ doxycycline  - WBC 9 on admit.  - Covid neg.   3. ETOH Abuse Drinks alcohol daily. ~6 pack per day.   4. Cocaine  UDS was + at [facility]  He denies cocaine use.    Length of Stay: 2  [name], NP  02/11/2019, 8:09 AM  Advanced Heart Failure Team Pager [phone] (M-F; 7a - 4p)  Please contact [facility] for night-coverage after hours (4p -7a ) and weekends on [email or web address]  Agree with above  He is now on dobutamine at 7.5. MV sats still in 20s c/w profound cardiogenic shock. Despite that says his breathing feels better this am. Less cough.   On exam Weak JVP to jaw Regular and tachy with s3 Lungs decreased at bases Ab soft NT Ext cool  Numbers c/w with profound cardiogenic shock despite high-dose dobutamine support. Will take emergently to cath lab for [facility] and then move to ICU. D/w Dr. Norina Buzzard. If hemodynamics poor on RHC plan Impella 5.5 placement this afternoon with possible eye to durable LVAD under Intermacs-1 criteria.   CRITICAL CARE Performed by: Arvilla Meres  Total critical care time: 35 minutes  Critical care time was exclusive of  separately billable procedures and treating other patients.  Critical care was necessary to treat or prevent imminent or life-threatening deterioration.  Critical care was time spent personally by me (independent of midlevel providers or residents) on the following activities: development of treatment plan with patient and/or surrogate as well as nursing, discussions with consultants, evaluation of patient's response to treatment, examination of patient, obtaining history from patient or surrogate, ordering and performing treatments and interventions, ordering and review of laboratory studies, ordering and review of radiographic studies, pulse oximetry and re-evaluation of patient's condition.  Arvilla Meres, MD  9:50 AM

## 2019-02-11 NOTE — Plan of Care (Signed)

## 2019-02-11 NOTE — Interval H&P Note (Signed)
History and Physical Interval Note:  02/11/2019 11:37 AM  Jon Martin  has presented today for surgery, with the diagnosis of Heart Failure Cardiogenic Shock.  The various methods of treatment have been discussed with the patient and family. After consideration of risks, benefits and other options for treatment, the patient has consented to  Procedure(s): RIGHT/LEFT HEART CATH AND CORONARY ANGIOGRAPHY (N/A) as a surgical intervention.  The patient's history has been reviewed, patient examined, no change in status, stable for surgery.  I have reviewed the patient's chart and labs.  Questions were answered to the patient's satisfaction.     [name]

## 2019-02-11 NOTE — Telephone Encounter (Signed)
CSW attempted to contact patient's mother by phone to discuss LVAD work up and set up a time to complete psychosocial assessment via phone with patient and mother. Unable to leave a message as no voicemail and no answer. CSW will follow up with patient later today. Lasandra Beech, LCSW, CCSW-MCS [phone]

## 2019-02-11 NOTE — Progress Notes (Signed)
  Amiodarone Drug - Drug Interaction Consult Note  Recommendations: -Check digoxin level at Css - if CrCl worsens recommend reducing dose  Amiodarone is metabolized by the cytochrome P450 system and therefore has the potential to cause many drug interactions. Amiodarone has an average plasma half-life of 50 days (range 20 to 100 days).   There is potential for drug interactions to occur several weeks or months after stopping treatment and the onset of drug interactions may be slow after initiating amiodarone.   []  Statins: Increased risk of myopathy. Simvastatin- restrict dose to 20mg  daily. Other statins: counsel patients to report any muscle pain or weakness immediately.  []  Anticoagulants: Amiodarone can increase anticoagulant effect. Consider warfarin dose reduction. Patients should be monitored closely and the dose of anticoagulant altered accordingly, remembering that amiodarone levels take several weeks to stabilize.  []  Antiepileptics: Amiodarone can increase plasma concentration of phenytoin, the dose should be reduced. Note that small changes in phenytoin dose can result in large changes in levels. Monitor patient and counsel on signs of toxicity.  []  Beta blockers: increased risk of bradycardia, AV block and myocardial depression. Sotalol - avoid concomitant use.  []   Calcium channel blockers (diltiazem and verapamil): increased risk of bradycardia, AV block and myocardial depression.  []   Cyclosporine: Amiodarone increases levels of cyclosporine. Reduced dose of cyclosporine is recommended.  [x]  Digoxin dose should be halved when amiodarone is started.  []  Diuretics: increased risk of cardiotoxicity if hypokalemia occurs.  []  Oral hypoglycemic agents (glyburide, glipizide, glimepiride): increased risk of hypoglycemia. Patient's glucose levels should be monitored closely when initiating amiodarone therapy.   []  Drugs that prolong the QT interval:  Torsades de pointes risk may  be increased with concurrent use - avoid if possible.  Monitor QTc, also keep magnesium/potassium WNL if concurrent therapy can't be avoided. Antibiotics: e.g. fluoroquinolones, erythromycin. . Antiarrhythmics: e.g. quinidine, procainamide, disopyramide, sotalol. . Antipsychotics: e.g. phenothiazines, haloperidol.  . Lithium, tricyclic antidepressants, and methadone. Thank  02/11/2019 12:47 PM

## 2019-02-11 NOTE — Progress Notes (Signed)
Initial Nutrition Assessment  DOCUMENTATION CODES:   Underweight, Severe malnutrition in context of chronic illness  INTERVENTION:   - Liberalize diet from Heart Healthy to 2 gram Sodium  - Ensure Enlive po TID, each supplement provides 350 kcal and 20 grams of protein  - Discussed the importance of adequate kcal and protein intake  - Encourage adequate PO intake  NUTRITION DIAGNOSIS:   Severe Malnutrition related to chronic illness (CHF) as evidenced by severe fat depletion, severe muscle depletion.  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 Pt transferred [facility] for consideration for LVAD.   01/06 - PICC placed 01/07 - RHC/LHC, [facility] with pt at bedside. RN in room at time of visit providing nursing care.  Pt reports that he is not currently hungry but that this is due to the fact that he had a procedure this morning. Pt reports that he typically has a great appetite and eats 3 meals daily. Pt has been in the hospital multiple times recently and reports eating typical hospital food. For example, a meal may include meat, chopped fruit, green beans, and mashed potatoes. Pt reports that when he is at home, he also has a good appetite and eats "when I am hungry." Pt clarifies that he eats 3 meals a day at home.  Per notes, pt with a history of EtOH abuse (~6 pack of beer per day).  Pt is unsure of his UBW and states that the doctors never tell him. Pt denies any recent weight loss and states that his clothes fit him as they usually do. Pt reports that he has always been on the slimmer side. No weight history available in chart.  Pt denies any issues chewing or swallowing and denies any GI symptoms including N/V. Pt  reports having regular bowel movements.  Discussed the importance of adequate nutrition both before and after surgery. Encouraged PO intake and supplement consumption. Pt is willing to drink "as many Ensures as you want me to." RD will order Ensure Enlive TID between meals.  Medications reviewed and include: Lasix 80 mg BID, spironolactone, IV abx, dobutamine  Labs reviewed: sodium 133, HDL 25  UOP: 1700 ml x 24 hours I/O's: -589 ml since admit  NUTRITION - FOCUSED PHYSICAL EXAM:    Most Recent Value  Orbital Region  Moderate depletion  Upper Arm Region  Severe depletion  Thoracic and Lumbar Region  Severe depletion  Buccal Region  Moderate depletion  Temple Region  Mild depletion  Clavicle Bone Region  Moderate depletion  Clavicle and Acromion Bone Region  Severe depletion  Scapular Bone Region  Unable to assess  Dorsal Hand  Mild depletion  Patellar Region  Moderate depletion  Anterior Thigh Region  Severe depletion  Posterior Calf Region  Severe depletion  Edema (RD Assessment)  None  Hair  Reviewed  Eyes  Reviewed  Mouth  Reviewed [several broken teeth]  Skin  Reviewed  Nails  Reviewed       Diet Order:   Diet Order            Diet 2 gram sodium Room service appropriate? Yes; Fluid consistency: Thin  Diet effective now  EDUCATION NEEDS:   Education needs have been addressed  Skin:  Skin Assessment: Reviewed RN Assessment  Last BM:  02/09/19  Height:   Ht Readings from Last 1 Encounters:  02/10/19 5\' 8"  (1.727 m)    Weight:   Wt Readings from Last 1 Encounters:  02/11/19 54.5 kg    Ideal Body Weight:  70 kg  BMI:  Body mass index is 18.27 kg/m.  Estimated Nutritional Needs:   Kcal:  2100-2300  Protein:  95-110 grams  Fluid:  >/= 1.8 L    Gaynell Face, MS, RD, LDN Inpatient Clinical Dietitian Pager: [phone] Weekend/After Hours: [phone]

## 2019-02-11 NOTE — Progress Notes (Signed)
CSW met at bedside with patient. CSW discussed LVAD work up and will reach out again to patient's mother. Patient moved to ICU and states he is coughing less today than yesterday. CSW discussed need to apply for Social Security Disability. Patient agreeable to start although will hold off a few days until patient feeling up to extended phone call. CSW will continue to follow for disability and possible LVAD work up. [name], [place], [place]

## 2019-02-12 ENCOUNTER — Inpatient Hospital Stay (HOSPITAL_COMMUNITY): Payer: Medicaid - Out of State

## 2019-02-12 DIAGNOSIS — Z01818 Encounter for other preprocedural examination: Secondary | ICD-10-CM

## 2019-02-12 DIAGNOSIS — R570 Cardiogenic shock: Secondary | ICD-10-CM

## 2019-02-12 DIAGNOSIS — Z7189 Other specified counseling: Secondary | ICD-10-CM

## 2019-02-12 LAB — BASIC METABOLIC PANEL
Anion gap: 13 (ref 5–15)
Anion gap: 12 (ref 5–15)
BUN: 16 mg/dL (ref 6–20)
BUN: 16 mg/dL (ref 6–20)
CO2: 22 mmol/L (ref 22–32)
CO2: 23 mmol/L (ref 22–32)
Calcium: 8.3 mg/dL — ABNORMAL LOW (ref 8.9–10.3)
Calcium: 8.4 mg/dL — ABNORMAL LOW (ref 8.9–10.3)
Chloride: 96 mmol/L — ABNORMAL LOW (ref 98–111)
Chloride: 95 mmol/L — ABNORMAL LOW (ref 98–111)
Creatinine, Ser: 1.39 mg/dL — ABNORMAL HIGH (ref 0.61–1.24)
Creatinine, Ser: 1.45 mg/dL — ABNORMAL HIGH (ref 0.61–1.24)
GFR calc Af Amer: >60 mL/min (ref >60)
GFR calc Af Amer: >60 mL/min (ref >60)
GFR calc non Af Amer: >60 mL/min (ref >60)
GFR calc non Af Amer: >60 mL/min (ref >60)
Glucose, Bld: 118 mg/dL — ABNORMAL HIGH (ref 70–99)
Glucose, Bld: 135 mg/dL — ABNORMAL HIGH (ref 70–99)
Potassium: 4 mmol/L (ref 3.5–5.1)
Potassium: 3.3 mmol/L — ABNORMAL LOW (ref 3.5–5.1)
Sodium: 131 mmol/L — ABNORMAL LOW (ref 135–145)
Sodium: 130 mmol/L — ABNORMAL LOW (ref 135–145)

## 2019-02-12 LAB — COOXEMETRY PANEL
Carboxyhemoglobin: 1.1 % (ref 0.5–1.5)
Carboxyhemoglobin: 1.7 % — ABNORMAL HIGH (ref 0.5–1.5)
Methemoglobin: 0.5 % (ref 0.0–1.5)
Methemoglobin: 1 % (ref 0.0–1.5)
O2 Saturation: 46.4 %
O2 Saturation: 65.5 %
Total hemoglobin: 10.8 g/dL — ABNORMAL LOW (ref 12.0–16.0)
Total hemoglobin: 10.9 g/dL — ABNORMAL LOW (ref 12.0–16.0)

## 2019-02-12 LAB — CORTISOL-AM, BLOOD: Cortisol - AM: 21.7 ug/dL (ref 6.7–22.6)

## 2019-02-12 LAB — MAGNESIUM: Magnesium: 1.7 mg/dL (ref 1.7–2.4)

## 2019-02-12 LAB — TSH: TSH: 3.26 u[IU]/mL (ref 0.350–4.500)

## 2019-02-12 MED ORDER — POTASSIUM CHLORIDE 10 MEQ/50ML IV SOLN
10.0000 meq | INTRAVENOUS | Status: AC
  Administered 2019-02-12 (×2): 10 meq via INTRAVENOUS

## 2019-02-12 MED ORDER — POTASSIUM CHLORIDE CRYS ER 20 MEQ PO TBCR: 30.0000 meq | EXTENDED_RELEASE_TABLET | Freq: Once | ORAL | Status: DC

## 2019-02-12 MED ORDER — GENERIC EXTERNAL MEDICATION
Status: DC
Start: ? — End: 2019-02-12

## 2019-02-12 MED ORDER — POTASSIUM CHLORIDE 10 MEQ/50ML IV SOLN
10.0000 meq | INTRAVENOUS | Status: AC
  Administered 2019-02-12 (×4): 10 meq via INTRAVENOUS
  Filled 2019-02-12 (×2): qty 50

## 2019-02-12 MED ORDER — MAGNESIUM SULFATE 2 GM/50ML IV SOLN
2.0000 g | Freq: Once | INTRAVENOUS | Status: AC
  Administered 2019-02-12: 2 g via INTRAVENOUS
  Filled 2019-02-12: qty 50

## 2019-02-12 MED ORDER — FUROSEMIDE 10 MG/ML IJ SOLN
80.0000 mg | Freq: Once | INTRAMUSCULAR | Status: AC
  Administered 2019-02-12: 80 mg via INTRAVENOUS
  Filled 2019-02-12: qty 8

## 2019-02-12 NOTE — Progress Notes (Signed)
CSW spoke with patient's mother via phone. She reports that patient recently approved for medicaid and to her knowledge has not applied for disability. She reports he has been sleeping a lot lately and for the most part is a "home body". She did states concern about his drinking and reported that he drinks "1-2 beers a day". She denies any knowledge of substance abuse stating "I have never witnessed it". She reports he was not working recently because of illness but in the past had worked at [employer] and other "odd jobs". CSW briefly discussed LVAD and will return call at later date to complete a full LVAD assessment at patient's bedside. Mother appears very supportive of her son and reports she is 66 years old. She states she works full time 5am-1pm in [employer] at a SNF near the family home. There are two other brothers and a nephew who reside in the home although one brother (age 62 yo) recently had brain surgery. CSW will follow up with patient and complete full LVAD assessment at bedside when medically appropriate. Lasandra Beech, LCSW, CCSW-MCS [phone]

## 2019-02-12 NOTE — Progress Notes (Addendum)
Advanced Heart Failure Rounding Note  PCP-Cardiologist: No primary care provider on file.   Subjective:    Underwent emergent cath on 1/7 for co-ox in the 20s. However cath with co-ox in 60-70 range so Impella placement deferred. Coronaries clean.   Overnight co-ox dropped into 30-40s again on DBA 5. Milrinone 0.125 added. Co-ox  42%-> 46%  Continues to feel weak and have cough. No CP   CVP 16 PA 47/30 Thermo 3.0/1.8 SVR 1892   Objective:   Weight Range: 53.8 kg Body mass index is 18.03 kg/m.   Vital Signs:   Temp:  [96.8 F (36 C)-98.4 F (36.9 C)] 97.9 F (36.6 C) (01/08 0600) Pulse Rate:  [92-208] 104 (01/08 0600) Resp:  [8-39] 18 (01/08 0600) BP: (81-120)/(40-87) 95/83 (01/08 0600) SpO2:  [66 %-100 %] 100 % (01/08 0600) Weight:  [53.8 kg] 53.8 kg (01/08 0500) Last BM Date: 02/10/19  Weight change: Filed Weights   02/10/19 0409 02/11/19 0306 02/12/19 0500  Weight: 56.2 kg 54.5 kg 53.8 kg    Intake/Output:   Intake/Output Summary (Last 24 hours) at 02/12/2019 0749 Last data filed at 02/12/2019 0600 Gross per 24 hour  Intake 1195.67 ml  Output 1650 ml  Net -454.33 ml      Physical Exam    General:  Thin weak appearing. + cough No resp difficulty HEENT: normal Neck: supple. RIJ swan Carotids 2+ bilat; no bruits. No lymphadenopathy or thryomegaly appreciated. Cor: PMI nondisplaced. Regular tachy + s3 Lungs: clear Abdomen: soft, nontender, nondistended. No hepatosplenomegaly. No bruits or masses. Good bowel sounds. Extremities: no cyanosis, clubbing, rash, edema Neuro: alert & orientedx3, cranial nerves grossly intact. moves all 4 extremities w/o difficulty. Affect pleasant   Telemetry  Sinus Tach 100-110s Personally reviewed    EKG  n/a  Labs    CBC Recent Labs    02/10/19 0219 02/11/19 1029 02/11/19 1035  WBC 9.0  --   --   NEUTROABS 5.2  --   --   HGB 12.1* 11.9*  12.2* 13.3  HCT 35.6* 35.0*  36.0* 39.0  MCV 97.8  --   --   PLT  226  --   --    Basic Metabolic Panel Recent Labs    02/11/19 0319 02/11/19 1035 02/12/19 0422  NA 133* 148* 131*  K 3.7 3.1* 4.0  CL 98  --  96*  CO2 25  --  22  GLUCOSE 146*  --  118*  BUN 13  --  16  CREATININE 1.14  --  1.39*  CALCIUM 8.4*  --  8.3*  MG 1.7  --  1.7   Liver Function Tests Recent Labs    02/10/19 0219  AST 33  ALT 34  ALKPHOS 64  BILITOT 1.9*  PROT 6.1*  ALBUMIN 2.8*   No results for input(s): LIPASE, AMYLASE in the last 72 hours. Cardiac Enzymes No results for input(s): CKTOTAL, CKMB, CKMBINDEX, TROPONINI in the last 72 hours.  BNP: BNP (last 3 results) Recent Labs    02/10/19 0219  BNP 2,048.6*    ProBNP (last 3 results) No results for input(s): PROBNP in the last 8760 hours.   D-Dimer No results for input(s): DDIMER in the last 72 hours. Hemoglobin A1C Recent Labs    02/11/19 0319  HGBA1C 5.2   Fasting Lipid Panel Recent Labs    02/11/19 0319  CHOL 89  HDL 25*  LDLCALC 52  TRIG 58  CHOLHDL 3.6   Thyroid Function Tests  Recent Labs    02/12/19 0422  TSH 3.260    Other results:   Imaging    CARDIAC CATHETERIZATION  Result Date: 02/11/2019 Findings: On dobutamine at 7.5 mcg/kg/min Ao = 85/71 (78) LV = 90/16 RA = 10/7 RV = 32/3 PA = 37/22 (28) PCW = 16 Fick cardiac output/index = 4.6/2.9 Thermo CO/CI = 2.8/1.8 (after development of AFL) Ao sat = 97% PA sat = 70%, 71% SVC sat 68% Assessment: 1. Normal coronary arteries 2. Severe NICM EF 10-15% 3. Improved hemodynamics on dobutamine 7.5 4. Development of rapid atrial tach during procedure Plan/Discussion: He is very tenuous but outputs and filling pressures better than expected. Will hold off on Impella today. Follow closely. Move to ICU and leave swan in. Start amio for atrial tach. Arvilla Meres, MD 11:49 AM     Medications:     Scheduled Medications: . Chlorhexidine Gluconate Cloth  6 each Topical Daily  . dextromethorphan-guaiFENesin  1 tablet Oral BID  .  digoxin  0.125 mg Oral Daily  . enoxaparin (LOVENOX) injection  40 mg Subcutaneous Q24H  . feeding supplement (ENSURE ENLIVE)  237 mL Oral TID BM  . sodium chloride flush  10-40 mL Intracatheter Q12H  . sodium chloride flush  10-40 mL Intracatheter Q12H  . sodium chloride flush  3 mL Intravenous Q12H  . sodium chloride flush  3 mL Intravenous Q12H  . sodium chloride flush  3 mL Intravenous Q12H  . spironolactone  12.5 mg Oral QHS    Infusions: . sodium chloride Stopped (02/10/19 1600)  . sodium chloride    . cefTRIAXone (ROCEPHIN)  IV Stopped (02/11/19 1611)  . DOBUTamine 5 mcg/kg/min (02/12/19 0600)  . doxycycline (VIBRAMYCIN) IV Stopped (02/12/19 0352)  . milrinone 0.125 mcg/kg/min (02/12/19 0600)    PRN Medications: sodium chloride, sodium chloride, acetaminophen, ALPRAZolam, ondansetron (ZOFRAN) IV, sodium chloride flush, sodium chloride flush, sodium chloride flush, sodium chloride flush, zolpidem     Assessment/Plan   1. Acute/Chronic Systolic HF -> cardiogenic shock - ECHO at [facility] EF , 20%.  - ECHO repeated and showed severely reduced EF <20%. RV only mildly down - R/L cath 1/7 normal cors. Output ok  - CO-OX back down on dobutamine 5 mcg. Milrinone added. Will increase milrinone to 0.3. repeat swan numberrs in 1 hour - Continue digoxin 0.125 mg daily.  - Continue 12.5 mg spiro daily.  - Give lasix 80 IV -Consult CT surgery and VAD team.  - VAD w/u underway. As cardiomyopathy seems relatively new-onset over past few weeks, I would like to give him a bit of time to recover but if we cannot stabilize we may have to move to mechanical support sooner. Increase milrinone reassess co-ox in 2 hours. If remains low can consider Impella 5.5 later today  2. Cough CXR- ? PNA.  - Procalcitonin 0.38>0.44.  - Treating rocephin+ doxycycline  - WBC 9 on admit.  - Covid neg.  - will get sputum cx  3. ETOH Abuse -Drinks alcohol daily. ~6 pack per day.   4. Cocaine  -UDS was  + at [facility]  -He denies cocaine use.   5. Atrial tach - had several hour episode of atrial tach post cath. Resolved with IV amio. Amio stopped.   CRITICAL CARE Performed by: Arvilla Meres  Total critical care time: 35 minutes  Critical care time was exclusive of separately billable procedures and treating other patients.  Critical care was necessary to treat or prevent imminent or life-threatening deterioration.  Critical care was time spent personally by me (independent of midlevel providers or residents) on the following activities: development of treatment plan with patient and/or surrogate as well as nursing, discussions with consultants, evaluation of patient's response to treatment, examination of patient, obtaining history from patient or surrogate, ordering and performing treatments and interventions, ordering and review of laboratory studies, ordering and review of radiographic studies, pulse oximetry and re-evaluation of patient's condition.    Length of Stay: 3  Arvilla Meres, MD  02/12/2019, 7:49 AM  Advanced Heart Failure Team Pager [phone] (M-F; 7a - 4p)  Please contact [facility] for night-coverage after hours (4p -7a ) and weekends on [email or web address]

## 2019-02-12 NOTE — Consult Note (Signed)
Consultation Note Date: 02/12/2019   Patient Name: Jon Martin  DOB: 04-10-1985  MRN: [id number]  Age / Sex: 34 y.o., male  PCP: Illene Silver, MD Referring Physician: Jolaine Artist, MD  Reason for Consultation: LVAD eval  HPI/Patient Profile: 34 y.o. male  with past medical history of nonischemic dilated cardiomyopathy, CHF EF 20%, LifeVest, polysubstance abuse admitted on 02/09/2019 with cardiogenic shock, inotrope support, and now VAD evaluation.   Clinical Assessment and Goals of Care: I met today at Saqib's bedside and no family or visitors present. He is very polite and friendly but with very short answers. I explained my role of palliative care in VAD evaluation and began by asking him what he understands about VAD. He tells me that he doesn't know anything about the LVAD so I began to explain a little and offered to pull up photo to show him in order to better help him to understand. He became antsy and overwhelmed and was not very interested in looking at photo of LVAD. I pulled back a little to speak with him about how overwhelming all this must be for him. I explained that LVAD is very scary and a big surgery but that it can be a very good option for people who get that stage in their heart disease. I explained that his medical team just wants to be prepared to make sure they are taking care of him and evaluating all options for him if needed. I reiterated that he is young and otherwise strong and could potentially do very well with LVAD if it came to that stage.   Johnpatrick mentions in our conversation that at some times he feels like "I just want to die and get it over with." He tells me that if he dies he wants to be alone instead of with his family who would just tell him that he "did this to himself." I offered him time to process if this is how he feels right now and he tells me no that he  does not feel like he wants to die or is dying. I encouraged him to let us know how we can help with this. I urged him to think further about what he would want if his health were to worsen and we will support him the best we are able no matter what the future holds. I also explained that none of these decisions should be rash but well thought out.   Harvir denies insomnia, depression, anxiety. I encouraged him to let us know if any of these issues become a problem so we can help. Discussed the frustration and burden to him of ICU hospitalization and numerous tests/diagnostics that he is put through. Provided encouragement to him to rest when he is able and to continue to be open and honest with himself and [place] about where he stands.   Alize also tells me that he lives with his mother and 2 brothers and has a sister who does not live in the home. He does  state that one of his brothers is mentally ill and his mother has to care for him. CSW Kennyth Lose is further evaluating social dynamics and support.   Discussed conversation and concerns with LVAD CSW [name].   Primary Decision Maker PATIENT    SUMMARY OF RECOMMENDATIONS   - Blayton is struggling with consideration of LVAD and I am curious how he tolerates future attempts for teaching and education as he was very fearful of discussing the basics of LVAD today.  - I also want to be more sure that LVAD is something Tali would want if offered/indicated as he is questioning this during our conversation.  - I also worry about potential substance abuse (previous elevated ETOH levels and + cocaine 01/16/19 but negative 01/25/19) and if LVAD is considered would recommend firm contract for resources and routine screening for substance abuse.  - Will need to evaluate as well his social support and ability to follow up as needed as he does live > 1 hr away and unsure of transportation, etc. LVAD CSW will follow up.   Code Status/Advance Care  Planning:  Full code   Consider    Symptom Management:   Per heart failure.   Consider SSRI but he did not feel he needs this at this time. Recommend continued emotional support.   Psycho-social/Spiritual:   Desire for further Chaplaincy support:no - would benefit from spiritual care visit in future but overwhelmed at current time with tests/visits  Additional Recommendations: Caregiving  Support/Resources and Grief/Bereavement Support, referral to community resources  Prognosis:  Overall prognosis concerning with advanced heart failure. Dependent on progress and eligibility/desire for advance therapies as well.   Discharge Planning: To Be Determined      Primary Diagnoses: Present on Admission: . Dilated cardiomyopathy ([place])   I have reviewed the medical record, interviewed the patient and family, and examined the patient. The following aspects are pertinent.  Past Medical History:  Diagnosis Date  . Dilated cardiomyopathy ([place])   . Dilated cardiomyopathy ([place]) 02/2019  . Polysubstance abuse ([place])    Social History   Socioeconomic History  . Marital status: Single    Spouse name: Not on file  . Number of children: Not on file  . Years of education: Not on file  . Highest education level: Not on file  Occupational History  . Not on file  Tobacco Use  . Smoking status: Current Every Day Smoker    Types: Cigarettes  . Smokeless tobacco: Never Used  Substance and Sexual Activity  . Alcohol use: Yes  . Drug use: Yes    Types: Cocaine  . Sexual activity: Not on file  Other Topics Concern  . Not on file  Social History Narrative  . Not on file   Social Determinants of Health   Financial Resource Strain:   . Difficulty of Paying Living Expenses: Not on file  Food Insecurity:   . Worried About Charity fundraiser in the Last Year: Not on file  . Ran Out of Food in the Last Year: Not on file  Transportation Needs:   . Lack of Transportation (Medical): Not  on file  . Lack of Transportation (Non-Medical): Not on file  Physical Activity:   . Days of Exercise per Week: Not on file  . Minutes of Exercise per Session: Not on file  Stress:   . Feeling of Stress : Not on file  Social Connections:   . Frequency of Communication with Friends and Family: Not on  file  . Frequency of Social Gatherings with Friends and Family: Not on file  . Attends Religious Services: Not on file  . Active Member of Clubs or Organizations: Not on file  . Attends Archivist Meetings: Not on file  . Marital Status: Not on file   Family History  Problem Relation Age of Onset  . CAD Father    Scheduled Meds: . Chlorhexidine Gluconate Cloth  6 each Topical Daily  . dextromethorphan-guaiFENesin  1 tablet Oral BID  . digoxin  0.125 mg Oral Daily  . enoxaparin (LOVENOX) injection  40 mg Subcutaneous Q24H  . feeding supplement (ENSURE ENLIVE)  237 mL Oral TID BM  . sodium chloride flush  10-40 mL Intracatheter Q12H  . sodium chloride flush  10-40 mL Intracatheter Q12H  . sodium chloride flush  3 mL Intravenous Q12H  . sodium chloride flush  3 mL Intravenous Q12H  . sodium chloride flush  3 mL Intravenous Q12H  . spironolactone  12.5 mg Oral QHS   Continuous Infusions: . sodium chloride Stopped (02/10/19 1600)  . sodium chloride 250 mL (02/12/19 0809)  . cefTRIAXone (ROCEPHIN)  IV Stopped (02/11/19 1611)  . DOBUTamine 5 mcg/kg/min (02/12/19 0600)  . doxycycline (VIBRAMYCIN) IV Stopped (02/12/19 0352)  . milrinone 0.3 mcg/kg/min (02/12/19 0806)  . potassium chloride     PRN Meds:.sodium chloride, sodium chloride, acetaminophen, ALPRAZolam, ondansetron (ZOFRAN) IV, sodium chloride flush, sodium chloride flush, sodium chloride flush, sodium chloride flush, zolpidem No Known Allergies Review of Systems  Constitutional: Negative for activity change and appetite change.  Respiratory: Negative for shortness of breath.   Psychiatric/Behavioral: Negative for  sleep disturbance. The patient is not nervous/anxious.     Physical Exam Vitals and nursing note reviewed.  Constitutional:      Appearance: Normal appearance.  Cardiovascular:     Rate and Rhythm: Tachycardia present.  Pulmonary:     Effort: Pulmonary effort is normal. No tachypnea, accessory muscle usage or respiratory distress.  Abdominal:     General: Abdomen is flat.  Neurological:     Mental Status: He is alert and oriented to person, place, and time.  Psychiatric:        Behavior: Behavior is cooperative.     Comments: Appeared to become slightly anxious and certainly overwhelmed during conversation; affect somewhat flat     Vital Signs: BP 95/83   Pulse (!) 104   Temp 97.9 F (36.6 C)   Resp 18   Ht 5' 8"  (1.727 m)   Wt 53.8 kg   SpO2 100%   BMI 18.03 kg/m  Pain Scale: 0-10 POSS *See Group Information*: 1-Acceptable,Awake and alert Pain Score: 0-No pain   SpO2: SpO2: 100 % O2 Device:SpO2: 100 % O2 Flow Rate: .O2 Flow Rate (L/min): 2 L/min  IO: Intake/output summary:   Intake/Output Summary (Last 24 hours) at 02/12/2019 0935 Last data filed at 02/12/2019 0600 Gross per 24 hour  Intake 1195.67 ml  Output 1650 ml  Net -454.33 ml    LBM: Last BM Date: 02/10/19 Baseline Weight: Weight: 56.2 kg Most recent weight: Weight: 53.8 kg     Palliative Assessment/Data:     Time In: 1015 Time Out: 1115 Time Total: 60 min Greater than 50%  of this time was spent counseling and coordinating care related to the above assessment and plan.  Signed by: [name], NP Palliative Medicine Team Pager # [phone] (M-F 8a-5p) Team Phone # [phone] (Nights/Weekends)

## 2019-02-13 LAB — EXPECTORATED SPUTUM ASSESSMENT W GRAM STAIN, RFLX TO RESP C

## 2019-02-13 LAB — COOXEMETRY PANEL
Carboxyhemoglobin: 1.7 % — ABNORMAL HIGH (ref 0.5–1.5)
Carboxyhemoglobin: 1.3 % (ref 0.5–1.5)
Carboxyhemoglobin: 1.8 % — ABNORMAL HIGH (ref 0.5–1.5)
Methemoglobin: 1 % (ref 0.0–1.5)
Methemoglobin: 0.9 % (ref 0.0–1.5)
Methemoglobin: 0.9 % (ref 0.0–1.5)
O2 Saturation: 67.4 %
O2 Saturation: 43.2 %
O2 Saturation: 64.8 %
Total hemoglobin: 9.9 g/dL — ABNORMAL LOW (ref 12.0–16.0)
Total hemoglobin: 11.6 g/dL — ABNORMAL LOW (ref 12.0–16.0)
Total hemoglobin: 10.5 g/dL — ABNORMAL LOW (ref 12.0–16.0)

## 2019-02-13 LAB — BASIC METABOLIC PANEL
Anion gap: 9 (ref 5–15)
BUN: 16 mg/dL (ref 6–20)
CO2: 26 mmol/L (ref 22–32)
Calcium: 8.5 mg/dL — ABNORMAL LOW (ref 8.9–10.3)
Chloride: 98 mmol/L (ref 98–111)
Creatinine, Ser: 1.32 mg/dL — ABNORMAL HIGH (ref 0.61–1.24)
GFR calc Af Amer: >60 mL/min (ref >60)
GFR calc non Af Amer: >60 mL/min (ref >60)
Glucose, Bld: 113 mg/dL — ABNORMAL HIGH (ref 70–99)
Potassium: 3.6 mmol/L (ref 3.5–5.1)
Sodium: 133 mmol/L — ABNORMAL LOW (ref 135–145)

## 2019-02-13 LAB — MAGNESIUM: Magnesium: 1.8 mg/dL (ref 1.7–2.4)

## 2019-02-13 LAB — LUPUS ANTICOAGULANT PANEL
DRVVT: 38.2 s (ref 0.0–47.0)
PTT Lupus Anticoagulant: 35.9 s (ref 0.0–51.9)

## 2019-02-13 LAB — PROCALCITONIN: Procalcitonin: 0.69 ng/mL

## 2019-02-13 MED ORDER — FUROSEMIDE 10 MG/ML IJ SOLN
80.0000 mg | Freq: Once | INTRAMUSCULAR | Status: AC
  Administered 2019-02-13: 80 mg via INTRAVENOUS
  Filled 2019-02-13: qty 8

## 2019-02-13 MED ORDER — MAGNESIUM SULFATE 2 GM/50ML IV SOLN
2.0000 g | Freq: Once | INTRAVENOUS | Status: AC
  Administered 2019-02-13: 2 g via INTRAVENOUS
  Filled 2019-02-13: qty 50

## 2019-02-13 MED ORDER — POTASSIUM CHLORIDE CRYS ER 20 MEQ PO TBCR
40.0000 meq | EXTENDED_RELEASE_TABLET | Freq: Once | ORAL | Status: AC
  Administered 2019-02-13: 40 meq via ORAL
  Filled 2019-02-13: qty 2

## 2019-02-13 MED ORDER — FUROSEMIDE 10 MG/ML IJ SOLN
80.0000 mg | Freq: Two times a day (BID) | INTRAMUSCULAR | Status: DC
  Administered 2019-02-13 – 2019-02-14 (×2): 80 mg via INTRAVENOUS
  Filled 2019-02-13 (×2): qty 8

## 2019-02-13 NOTE — Progress Notes (Signed)
Advanced Heart Failure Rounding Note  PCP-Cardiologist: No primary care provider on file.   Subjective:    Underwent emergent cath on 1/7 for co-ox in the 20s. However cath with co-ox in 60-70 range so Impella placement deferred. Coronaries clean.   Milrinone added over the last 2 days and co-ox much improved. Now on DBA 5 and milrinone 0.25  CVP 22 PA 42/27 Thermo 3.8/2.3 SVR 1308   Objective:   Weight Range: 55.6 kg Body mass index is 18.64 kg/m.   Vital Signs:   Temp:  [97.2 F (36.2 C)-98.4 F (36.9 C)] 98.4 F (36.9 C) (01/09 0700) Pulse Rate:  [96-206] 105 (01/09 0700) Resp:  [0-30] 16 (01/09 0700) BP: (92-111)/(67-83) 110/83 (01/09 0700) SpO2:  [91 %-100 %] 94 % (01/09 0700) Weight:  [55.6 kg] 55.6 kg (01/09 0500) Last BM Date: 02/12/19  Weight change: Filed Weights   02/11/19 0306 02/12/19 0500 02/13/19 0500  Weight: 54.5 kg 53.8 kg 55.6 kg    Intake/Output:   Intake/Output Summary (Last 24 hours) at 02/13/2019 0820 Last data filed at 02/13/2019 0700 Gross per 24 hour  Intake 982.2 ml  Output 1475 ml  Net -492.8 ml      Physical Exam    General:  Thin weak appearing. + cough No resp difficulty HEENT: normal Neck: supple. JVP to ear Carotids 2+ bilat; no bruits. No lymphadenopathy or thryomegaly appreciated. Cor: PMI nondisplaced.Tachy regular + s3. No rubs, gallops or murmurs. Lungs: clear Abdomen: soft, nontender, nondistended. No hepatosplenomegaly. No bruits or masses. Good bowel sounds. Extremities: no cyanosis, clubbing, rash, tr edema Neuro: alert & orientedx3, cranial nerves grossly intact. moves all 4 extremities w/o difficulty. Affect pleasant   Telemetry   Sinus Tach 100-110 Personally reviewed  Labs    CBC Recent Labs    02/11/19 1029 02/11/19 1035  HGB 11.9*   12.2* 13.3  HCT 35.0*   36.0* 39.0   Basic Metabolic Panel Recent Labs    78/67/67 0422 02/12/19 1500 02/13/19 0330  NA 131* 130* 133*  K 4.0 3.3* 3.6    CL 96* 95* 98  CO2 22 23 26   GLUCOSE 118* 135* 113*  BUN 16 16 16   CREATININE 1.39* 1.45* 1.32*  CALCIUM 8.3* 8.4* 8.5*  MG 1.7  --  1.8   Liver Function Tests No results for input(s): AST, ALT, ALKPHOS, BILITOT, PROT, ALBUMIN in the last 72 hours. No results for input(s): LIPASE, AMYLASE in the last 72 hours. Cardiac Enzymes No results for input(s): CKTOTAL, CKMB, CKMBINDEX, TROPONINI in the last 72 hours.  BNP: BNP (last 3 results) Recent Labs    02/10/19 0219  BNP 2,048.6*    ProBNP (last 3 results) No results for input(s): PROBNP in the last 8760 hours.   D-Dimer No results for input(s): DDIMER in the last 72 hours. Hemoglobin A1C Recent Labs    02/11/19 0319  HGBA1C 5.2   Fasting Lipid Panel Recent Labs    02/11/19 0319  CHOL 89  HDL 25*  LDLCALC 52  TRIG 58  CHOLHDL 3.6   Thyroid Function Tests Recent Labs    02/12/19 0422  TSH 3.260    Other results:   Imaging    CT ABDOMEN PELVIS WO CONTRAST  Result Date: 02/12/2019 CLINICAL DATA:  Shortness of breath EXAM: CT CHEST, ABDOMEN AND PELVIS WITHOUT CONTRAST TECHNIQUE: Multidetector CT imaging of the chest, abdomen and pelvis was performed following the standard protocol without IV contrast. COMPARISON:  None. FINDINGS: CT CHEST  FINDINGS Cardiovascular: Pacer wires noted in the right heart. Heart is mildly enlarged. Aorta is normal caliber. Mediastinum/Nodes: No mediastinal, hilar, or axillary adenopathy. Trachea and esophagus are unremarkable. Thyroid unremarkable. Lungs/Pleura: Moderate to large right pleural effusion and small left pleural effusion. There are patchy central airspace opacities in the lungs bilaterally, slightly upper lobe predominance. This could reflect edema or infection. Interlobular septal thickening in the upper lobes. Compressive atelectasis in the right lower lobe. Musculoskeletal: Chest wall soft tissues are unremarkable. No acute bony abnormality. CT ABDOMEN PELVIS FINDINGS  Hepatobiliary: Layering high-density material in the gallbladder, likely small stones. Gallbladder wall appears thickened. No focal hepatic abnormality or biliary ductal dilatation. Pancreas: No focal abnormality or ductal dilatation. Spleen: No focal abnormality.  Normal size. Adrenals/Urinary Tract: No adrenal abnormality. No focal renal abnormality. No stones or hydronephrosis. Urinary bladder is unremarkable. Stomach/Bowel: Normal appendix. Stomach, large and small bowel grossly unremarkable. Vascular/Lymphatic: No evidence of aneurysm or adenopathy. Reproductive: No visible focal abnormality. Other: Small amount of perihepatic ascites and ascites in the cul-de-sac. Musculoskeletal: No acute bony abnormality. IMPRESSION: Cardiomegaly. There are vague central ground-glass airspace opacities within the lungs bilaterally. Interlobular septal thickening also noted. Findings could reflect edema or infection. Moderate to large right effusion and small left effusion. Compressive atelectasis in the right lower lobe. Layering high-density material in the gallbladder. Gallbladder wall appears thickened. If there is clinical concern for possible cholecystitis, right upper quadrant ultrasound may be beneficial. Small amount of ascites in the cul-de-sac and adjacent to the liver. Electronically Signed   By: Charlett Nose M.D.   On: 02/12/2019 12:18   DG Orthopantogram  Result Date: 02/12/2019 CLINICAL DATA:  Preoperative evaluation. EXAM: ORTHOPANTOGRAM/PANORAMIC COMPARISON:  None. FINDINGS: There is no evidence of acute fracture or dislocation. No lytic or blastic lesions are identified. There is no evidence of cortical destruction or acute periosteal reaction. Soft tissue structures are unremarkable. IMPRESSION: 1. No acute osseous or soft tissue abnormality within the mandibles. Electronically Signed   By: Aram Candela M.D.   On: 02/12/2019 23:04   CT chest without contrast  Result Date: 02/12/2019 CLINICAL  DATA:  Shortness of breath EXAM: CT CHEST, ABDOMEN AND PELVIS WITHOUT CONTRAST TECHNIQUE: Multidetector CT imaging of the chest, abdomen and pelvis was performed following the standard protocol without IV contrast. COMPARISON:  None. FINDINGS: CT CHEST FINDINGS Cardiovascular: Pacer wires noted in the right heart. Heart is mildly enlarged. Aorta is normal caliber. Mediastinum/Nodes: No mediastinal, hilar, or axillary adenopathy. Trachea and esophagus are unremarkable. Thyroid unremarkable. Lungs/Pleura: Moderate to large right pleural effusion and small left pleural effusion. There are patchy central airspace opacities in the lungs bilaterally, slightly upper lobe predominance. This could reflect edema or infection. Interlobular septal thickening in the upper lobes. Compressive atelectasis in the right lower lobe. Musculoskeletal: Chest wall soft tissues are unremarkable. No acute bony abnormality. CT ABDOMEN PELVIS FINDINGS Hepatobiliary: Layering high-density material in the gallbladder, likely small stones. Gallbladder wall appears thickened. No focal hepatic abnormality or biliary ductal dilatation. Pancreas: No focal abnormality or ductal dilatation. Spleen: No focal abnormality.  Normal size. Adrenals/Urinary Tract: No adrenal abnormality. No focal renal abnormality. No stones or hydronephrosis. Urinary bladder is unremarkable. Stomach/Bowel: Normal appendix. Stomach, large and small bowel grossly unremarkable. Vascular/Lymphatic: No evidence of aneurysm or adenopathy. Reproductive: No visible focal abnormality. Other: Small amount of perihepatic ascites and ascites in the cul-de-sac. Musculoskeletal: No acute bony abnormality. IMPRESSION: Cardiomegaly. There are vague central ground-glass airspace opacities within the lungs bilaterally. Interlobular septal  thickening also noted. Findings could reflect edema or infection. Moderate to large right effusion and small left effusion. Compressive atelectasis in the  right lower lobe. Layering high-density material in the gallbladder. Gallbladder wall appears thickened. If there is clinical concern for possible cholecystitis, right upper quadrant ultrasound may be beneficial. Small amount of ascites in the cul-de-sac and adjacent to the liver. Electronically Signed   By: Charlett Nose M.D.   On: 02/12/2019 12:18     Medications:     Scheduled Medications:  Chlorhexidine Gluconate Cloth  6 each Topical Daily   dextromethorphan-guaiFENesin  1 tablet Oral BID   digoxin  0.125 mg Oral Daily   enoxaparin (LOVENOX) injection  40 mg Subcutaneous Q24H   feeding supplement (ENSURE ENLIVE)  237 mL Oral TID BM   furosemide  80 mg Intravenous Once   sodium chloride flush  10-40 mL Intracatheter Q12H   sodium chloride flush  10-40 mL Intracatheter Q12H   sodium chloride flush  3 mL Intravenous Q12H   sodium chloride flush  3 mL Intravenous Q12H   sodium chloride flush  3 mL Intravenous Q12H   spironolactone  12.5 mg Oral QHS    Infusions:  sodium chloride Stopped (02/10/19 1600)   sodium chloride Stopped (02/12/19 1659)   cefTRIAXone (ROCEPHIN)  IV Stopped (02/12/19 1600)   DOBUTamine 5 mcg/kg/min (02/13/19 0700)   doxycycline (VIBRAMYCIN) IV 100 mg (02/13/19 0224)   milrinone 0.3 mcg/kg/min (02/13/19 0700)    PRN Medications: sodium chloride, sodium chloride, acetaminophen, ALPRAZolam, ondansetron (ZOFRAN) IV, sodium chloride flush, sodium chloride flush, sodium chloride flush, sodium chloride flush, zolpidem     Assessment/Plan   1. Acute/Chronic Systolic HF -> cardiogenic shock - ECHO at [facility] EF , 20%.  - ECHO repeated and showed severely reduced EF <20%. RV only mildly down - R/L cath 1/7 normal cors. Output ok  - Co-ox 67% on DBA 5 and milrinone 0.3. Decrease DBA to 3 and follow co-ox.  Ernestine Conrad numbers this am done personally. Outputs better but numbers looking much more like RV failure. On admit RV did not look that bad.    - Will diurese with lasix 80 IV bid - Continue digoxin 0.125 mg daily.  - Continue 12.5 mg spiro daily.  - VAD w/u underway. As cardiomyopathy seems relatively new-onset over past few weeks, I would like to give him a bit of time to recover but if we cannot stabilize we may have to move to mechanical support sooner.   2. Cough CXR- ? PNA.  - Procalcitonin 0.38>0.44>0.69 - Treating rocephin+ doxycycline  - WBC 9 on admit.  - Covid neg.  - sputum cx pending  3. ETOH Abuse -Drinks alcohol daily. ~6 pack per day.   4. Cocaine  -UDS was + at [facility]  -He denies cocaine use.   5. Atrial tach - had several hour episode of atrial tach post cath. Resolved with IV amio. Amio stopped.  6. Hypokalemia - supp    CRITICAL CARE Performed by: Arvilla Meres  Total critical care time: 35 minutes  Critical care time was exclusive of separately billable procedures and treating other patients.  Critical care was necessary to treat or prevent imminent or life-threatening deterioration.  Critical care was time spent personally by me (independent of midlevel providers or residents) on the following activities: development of treatment plan with patient and/or surrogate as well as nursing, discussions with consultants, evaluation of patient's response to treatment, examination of patient, obtaining history from patient  or surrogate, ordering and performing treatments and interventions, ordering and review of laboratory studies, ordering and review of radiographic studies, pulse oximetry and re-evaluation of patient's condition.    Length of Stay: 4  Glori Bickers, MD  02/13/2019, 8:20 AM  Advanced Heart Failure Team Pager [phone] (M-F; 7a - 4p)  Please contact [facility] for night-coverage after hours (4p -7a ) and weekends on [email or web address]

## 2019-02-13 NOTE — Plan of Care (Signed)
  Problem: Clinical Measurements: Goal: Ability to maintain clinical measurements within normal limits will improve Outcome: Progressing Goal: Will remain free from infection Outcome: Progressing Goal: Diagnostic test results will improve Outcome: Progressing Goal: Respiratory complications will improve Outcome: Progressing   Problem: Activity: Goal: Risk for activity intolerance will decrease Outcome: Progressing   Problem: Nutrition: Goal: Adequate nutrition will be maintained Outcome: Progressing   Problem: Education: Goal: Knowledge of General Education information will improve Description: Including pain rating scale, medication(s)/side effects and non-pharmacologic comfort measures Outcome: Not Progressing   Problem: Health Behavior/Discharge Planning: Goal: Ability to manage health-related needs will improve Outcome: Not Progressing

## 2019-02-14 LAB — COOXEMETRY PANEL
Carboxyhemoglobin: 2.1 % — ABNORMAL HIGH (ref 0.5–1.5)
Carboxyhemoglobin: 1.9 % — ABNORMAL HIGH (ref 0.5–1.5)
Methemoglobin: 0.9 % (ref 0.0–1.5)
Methemoglobin: 0.9 % (ref 0.0–1.5)
O2 Saturation: 74.6 %
O2 Saturation: 65.2 %
Total hemoglobin: 11.1 g/dL — ABNORMAL LOW (ref 12.0–16.0)
Total hemoglobin: 11.3 g/dL — ABNORMAL LOW (ref 12.0–16.0)

## 2019-02-14 LAB — BASIC METABOLIC PANEL
Anion gap: 9 (ref 5–15)
BUN: 17 mg/dL (ref 6–20)
CO2: 28 mmol/L (ref 22–32)
Calcium: 8.5 mg/dL — ABNORMAL LOW (ref 8.9–10.3)
Chloride: 95 mmol/L — ABNORMAL LOW (ref 98–111)
Creatinine, Ser: 0.95 mg/dL (ref 0.61–1.24)
GFR calc Af Amer: >60 mL/min (ref >60)
GFR calc non Af Amer: >60 mL/min (ref >60)
Glucose, Bld: 106 mg/dL — ABNORMAL HIGH (ref 70–99)
Potassium: 3.7 mmol/L (ref 3.5–5.1)
Sodium: 132 mmol/L — ABNORMAL LOW (ref 135–145)

## 2019-02-14 MED ORDER — POTASSIUM CHLORIDE CRYS ER 20 MEQ PO TBCR
40.0000 meq | EXTENDED_RELEASE_TABLET | Freq: Once | ORAL | Status: AC
  Administered 2019-02-14: 40 meq via ORAL
  Filled 2019-02-14: qty 2

## 2019-02-14 MED ORDER — FUROSEMIDE 40 MG PO TABS
40.0000 mg | ORAL_TABLET | Freq: Two times a day (BID) | ORAL | Status: DC
  Administered 2019-02-14 – 2019-02-16 (×4): 40 mg via ORAL
  Filled 2019-02-14 (×4): qty 1

## 2019-02-14 NOTE — Plan of Care (Signed)

## 2019-02-14 NOTE — Progress Notes (Signed)
Advanced Heart Failure Rounding Note  PCP-Cardiologist: No primary care provider on file.   Subjective:    Underwent emergent cath on 1/7 for co-ox in the 20s. However cath with co-ox in 60-70 range so Impella placement deferred. Coronaries clean.   Milrinone added and co-ox much improved. Now on DBA 2.5 and milrinone 0.375. Co-ox 65%  Feeling better. Denies SOB, orthopnea or PND. On lasix 80 IV bid.  6.6L out yesterday. Weight up 4 pounds (?).   Jon Martin numbers done personally this am RA 10 PA 38/22 (30) PCWP 15 Thermo 4.8/2.9 SVR 1308 PVR 2.5  Objective:   Weight Range: 55.6 kg Body mass index is 18.64 kg/m.   Vital Signs:   Temp:  [97.7 F (36.5 C)-98.6 F (37 C)] 97.9 F (36.6 C) (01/10 0800) Pulse Rate:  [92-191] 103 (01/10 0800) Resp:  [6-30] 30 (01/10 0800) BP: (89-112)/(65-87) 112/78 (01/10 0800) SpO2:  [96 %-100 %] 100 % (01/10 0800) Last BM Date: 02/12/19  Weight change: Filed Weights   02/11/19 0306 02/12/19 0500 02/13/19 0500  Weight: 54.5 kg 53.8 kg 55.6 kg    Intake/Output:   Intake/Output Summary (Last 24 hours) at 02/14/2019 0859 Last data filed at 02/14/2019 0800 Gross per 24 hour  Intake 2558.99 ml  Output 6625 ml  Net -4066.01 ml      Physical Exam    General:  Thin male sitting in chair HEENT: normal Neck: supple. JVP 10. RIJ swan Carotids 2+ bilat; no bruits. No lymphadenopathy or thryomegaly appreciated. Cor: PMI laterally displaced. Tachy regular + 3s Lungs: clear Abdomen: soft, nontender, nondistended. No hepatosplenomegaly. No bruits or masses. Good bowel sounds. Extremities: no cyanosis, clubbing, rash, edema Neuro: alert & orientedx3, cranial nerves grossly intact. moves all 4 extremities w/o difficulty. Affect pleasant   Telemetry   Sinus Tach 100-110 Personally reviewed  Labs    CBC Recent Labs    02/11/19 1029 02/11/19 1035  HGB 11.9*  12.2* 13.3  HCT 35.0*  36.0* 39.0   Basic Metabolic Panel Recent Labs     02/12/19 0422 02/13/19 0330 02/14/19 0421  NA 131* 133* 132*  K 4.0 3.6 3.7  CL 96* 98 95*  CO2 22 26 28   GLUCOSE 118* 113* 106*  BUN 16 16 17   CREATININE 1.39* 1.32* 0.95  CALCIUM 8.3* 8.5* 8.5*  MG 1.7 1.8  --    Liver Function Tests No results for input(s): AST, ALT, ALKPHOS, BILITOT, PROT, ALBUMIN in the last 72 hours. No results for input(s): LIPASE, AMYLASE in the last 72 hours. Cardiac Enzymes No results for input(s): CKTOTAL, CKMB, CKMBINDEX, TROPONINI in the last 72 hours.  BNP: BNP (last 3 results) Recent Labs    02/10/19 0219  BNP 2,048.6*    ProBNP (last 3 results) No results for input(s): PROBNP in the last 8760 hours.   D-Dimer No results for input(s): DDIMER in the last 72 hours. Hemoglobin A1C No results for input(s): HGBA1C in the last 72 hours. Fasting Lipid Panel No results for input(s): CHOL, HDL, LDLCALC, TRIG, CHOLHDL, LDLDIRECT in the last 72 hours. Thyroid Function Tests Recent Labs    02/12/19 0422  TSH 3.260    Other results:   Imaging    No results found.   Medications:     Scheduled Medications: . Chlorhexidine Gluconate Cloth  6 each Topical Daily  . dextromethorphan-guaiFENesin  1 tablet Oral BID  . digoxin  0.125 mg Oral Daily  . enoxaparin (LOVENOX) injection  40 mg Subcutaneous  Q24H  . feeding supplement (ENSURE ENLIVE)  237 mL Oral TID BM  . furosemide  80 mg Intravenous BID  . sodium chloride flush  10-40 mL Intracatheter Q12H  . sodium chloride flush  10-40 mL Intracatheter Q12H  . sodium chloride flush  3 mL Intravenous Q12H  . sodium chloride flush  3 mL Intravenous Q12H  . sodium chloride flush  3 mL Intravenous Q12H  . spironolactone  12.5 mg Oral QHS    Infusions: . sodium chloride Stopped (02/10/19 1600)  . sodium chloride Stopped (02/12/19 1659)  . cefTRIAXone (ROCEPHIN)  IV 2 g (02/13/19 1413)  . DOBUTamine 2.5 mcg/kg/min (02/14/19 0800)  . doxycycline (VIBRAMYCIN) IV Stopped (02/14/19 0435)    . milrinone 0.375 mcg/kg/min (02/14/19 0800)    PRN Medications: sodium chloride, sodium chloride, acetaminophen, ALPRAZolam, ondansetron (ZOFRAN) IV, sodium chloride flush, sodium chloride flush, sodium chloride flush, sodium chloride flush, zolpidem     Assessment/Plan   1. Acute/Chronic Systolic HF -> cardiogenic shock - ECHO at [facility] EF , 20%.  - ECHO repeated and showed severely reduced EF <20%. RV only mildly down - R/L cath 1/7 normal cors. Output ok  - Co-ox 65% on DBA 2.5 and milrinone 0.375 Decrease DBA to 1 and follow co-ox.  Luiz Blare numbers this am done personally. Much improved - Diuresing very well. Volume improving. Change to lasix 40 po bid - Continue digoxin 0.125 mg daily.  - Continue 12.5 mg spiro daily.  - VAD w/u underway. As cardiomyopathy seems relatively new-onset over past few weeks, I would like to give him a bit of time to recover but if we cannot stabilize we may have to move to mechanical support sooner.   2. Cough CXR- ? PNA.  - Procalcitonin 0.38>0.44>0.69 - Treating rocephin+ doxycycline  - WBC 9 on admit.  - Covid neg.  - sputum cx NGTD  3. ETOH Abuse -Drinks alcohol daily. ~6 pack per day.   4. Cocaine  -UDS was + at [facility]  -He denies cocaine use.   5. Atrial tach - had several hour episode of atrial tach post cath. Resolved with IV amio. Amio stopped.  6. Hypokalemia - K 3.7 supp    CRITICAL CARE Performed by: Glori Bickers  Total critical care time: 35 minutes  Critical care time was exclusive of separately billable procedures and treating other patients.  Critical care was necessary to treat or prevent imminent or life-threatening deterioration.  Critical care was time spent personally by me (independent of midlevel providers or residents) on the following activities: development of treatment plan with patient and/or surrogate as well as nursing, discussions with consultants, evaluation of patient's response to  treatment, examination of patient, obtaining history from patient or surrogate, ordering and performing treatments and interventions, ordering and review of laboratory studies, ordering and review of radiographic studies, pulse oximetry and re-evaluation of patient's condition.    Length of Stay: [place], MD  02/14/2019, 8:59 AM  Advanced Heart Failure Team Pager [phone] (M-F; 7a - 4p)  Please contact [facility] for night-coverage after hours (4p -7a ) and weekends on [email or web address]

## 2019-02-15 ENCOUNTER — Inpatient Hospital Stay (HOSPITAL_COMMUNITY): Payer: Medicaid - Out of State

## 2019-02-15 DIAGNOSIS — Z01810 Encounter for preprocedural cardiovascular examination: Secondary | ICD-10-CM

## 2019-02-15 LAB — DIGOXIN LEVEL: Digoxin Level: <0.2 ng/mL — ABNORMAL LOW (ref 0.8–2.0)

## 2019-02-15 LAB — BPAM RBC
Blood Product Expiration Date: 202102082359
Blood Product Expiration Date: 202102092359
ISSUE DATE / TIME: 202101071030
ISSUE DATE / TIME: 202101071030
Unit Type and Rh: 5100
Unit Type and Rh: 5100

## 2019-02-15 LAB — COOXEMETRY PANEL
Carboxyhemoglobin: 2.2 % — ABNORMAL HIGH (ref 0.5–1.5)
Carboxyhemoglobin: 1.7 % — ABNORMAL HIGH (ref 0.5–1.5)
Methemoglobin: 0.7 % (ref 0.0–1.5)
Methemoglobin: 0.4 % (ref 0.0–1.5)
O2 Saturation: 74.5 %
O2 Saturation: 74.6 %
Total hemoglobin: 14.1 g/dL (ref 12.0–16.0)
Total hemoglobin: 11.1 g/dL — ABNORMAL LOW (ref 12.0–16.0)

## 2019-02-15 LAB — CBC
HCT: 35.2 % — ABNORMAL LOW (ref 39.0–52.0)
Hemoglobin: 11.8 g/dL — ABNORMAL LOW (ref 13.0–17.0)
MCH: 33.5 pg (ref 26.0–34.0)
MCHC: 33.5 g/dL (ref 30.0–36.0)
MCV: 100 fL (ref 80.0–100.0)
Platelets: 149 10*3/uL — ABNORMAL LOW (ref 150–400)
RBC: 3.52 MIL/uL — ABNORMAL LOW (ref 4.22–5.81)
RDW: 15.5 % (ref 11.5–15.5)
WBC: 8.1 10*3/uL (ref 4.0–10.5)
nRBC: 0 % (ref 0.0–0.2)

## 2019-02-15 LAB — TYPE AND SCREEN
ABO/RH(D): O POS
Antibody Screen: NEGATIVE
Unit division: 0
Unit division: 0

## 2019-02-15 LAB — PULMONARY FUNCTION TEST
FEF 25-75 Pre: 2.34 L/sec
FEF2575-%Pred-Pre: 60 %
FEV1-%Pred-Pre: 58 %
FEV1-Pre: 2.04 L
FEV1FVC-%Pred-Pre: 101 %
FEV6-%Pred-Pre: 58 %
FEV6-Pre: 2.41 L
FEV6FVC-%Pred-Pre: 100 %
FVC-%Pred-Pre: 57 %
FVC-Pre: 2.43 L
Pre FEV1/FVC ratio: 84 %
Pre FEV6/FVC Ratio: 99 %

## 2019-02-15 LAB — BASIC METABOLIC PANEL
Anion gap: 9 (ref 5–15)
BUN: 21 mg/dL — ABNORMAL HIGH (ref 6–20)
CO2: 28 mmol/L (ref 22–32)
Calcium: 9 mg/dL (ref 8.9–10.3)
Chloride: 96 mmol/L — ABNORMAL LOW (ref 98–111)
Creatinine, Ser: 0.99 mg/dL (ref 0.61–1.24)
GFR calc Af Amer: >60 mL/min (ref >60)
GFR calc non Af Amer: >60 mL/min (ref >60)
Glucose, Bld: 99 mg/dL (ref 70–99)
Potassium: 3.5 mmol/L (ref 3.5–5.1)
Sodium: 133 mmol/L — ABNORMAL LOW (ref 135–145)

## 2019-02-15 LAB — MAGNESIUM: Magnesium: 1.2 mg/dL — ABNORMAL LOW (ref 1.7–2.4)

## 2019-02-15 MED ORDER — LOSARTAN POTASSIUM 25 MG PO TABS
12.5000 mg | ORAL_TABLET | Freq: Every day | ORAL | Status: DC
  Administered 2019-02-15 – 2019-02-18 (×4): 12.5 mg via ORAL
  Filled 2019-02-15 (×4): qty 1

## 2019-02-15 MED ORDER — SPIRONOLACTONE 25 MG PO TABS
25.0000 mg | ORAL_TABLET | Freq: Every day | ORAL | Status: DC
  Administered 2019-02-15 – 2019-02-18 (×4): 25 mg via ORAL
  Filled 2019-02-15 (×4): qty 1

## 2019-02-15 MED ORDER — MAGNESIUM SULFATE 4 GM/100ML IV SOLN: 4.0000 g | Freq: Once | INTRAVENOUS | Status: DC

## 2019-02-15 MED ORDER — MAGNESIUM SULFATE 50 % IJ SOLN
6.0000 g | Freq: Once | INTRAVENOUS | Status: AC
  Administered 2019-02-15: 6 g via INTRAVENOUS
  Filled 2019-02-15: qty 12

## 2019-02-15 MED ORDER — POTASSIUM CHLORIDE CRYS ER 20 MEQ PO TBCR
40.0000 meq | EXTENDED_RELEASE_TABLET | ORAL | Status: AC
  Administered 2019-02-15 (×2): 40 meq via ORAL
  Filled 2019-02-15 (×2): qty 2

## 2019-02-15 NOTE — Plan of Care (Signed)
  Problem: Clinical Measurements: Goal: Ability to maintain clinical measurements within normal limits will improve Outcome: Progressing Goal: Will remain free from infection Outcome: Progressing Goal: Diagnostic test results will improve Outcome: Progressing Goal: Respiratory complications will improve Outcome: Progressing Goal: Cardiovascular complication will be avoided Outcome: Progressing   Problem: Activity: Goal: Risk for activity intolerance will decrease Outcome: Progressing   Problem: Nutrition: Goal: Adequate nutrition will be maintained Outcome: Progressing   Problem: Coping: Goal: Level of anxiety will decrease Outcome: Progressing   Problem: Elimination: Goal: Will not experience complications related to bowel motility Outcome: Progressing Goal: Will not experience complications related to urinary retention Outcome: Progressing   Problem: Pain Managment: Goal: General experience of comfort will improve Outcome: Progressing   Problem: Safety: Goal: Ability to remain free from injury will improve Outcome: Progressing   Problem: Skin Integrity: Goal: Risk for impaired skin integrity will decrease Outcome: Progressing   Problem: Education: Goal: Ability to demonstrate management of disease process will improve Outcome: Not Progressing Goal: Ability to verbalize understanding of medication therapies will improve Outcome: Not Progressing Goal: Individualized Educational Video(s) Outcome: Not Progressing

## 2019-02-15 NOTE — Progress Notes (Signed)
Chaplain engaged in initial visit with Jon Martin.  Chaplain introduced her role as a Orthoptist and offered support to Jon Martin.  Chaplain will follow-up.

## 2019-02-15 NOTE — Progress Notes (Signed)
VASCULAR LAB PRELIMINARY  PRELIMINARY  PRELIMINARY  PRELIMINARY  Bilateral lower extremity venous duplex completed.    Preliminary report:  See CV proc for preliminary results.  Elizibeth Breau, RVT 02/15/2019, 6:18 PM

## 2019-02-15 NOTE — Progress Notes (Signed)
CSW met at bedside with patient who states that he is doing well and awaiting word on the next steps. CSW discussed past substance use and patient admits to daily alcohol use of 1-2 beers which is consistent with his Mothers report. He denies any substance use and when asked about his positive test result in early December he stated that "my friend gave me a pill for pain because I had a tooth ache". CSW inquired about a pill as he tested positive for cocaine and was not aware that cocaine came in a pill form. Patient denied any other substance use in the past. Patient admitted to past tobacco use but not felt well enough recently. CSW completed a MME at bedside and patient scored a 29 out of 30.  Patient agreeable to pursue disability application through [facility] and will begin process at bedside. CSW will follow up tomorrow and continue discussions around disability and VAD work up. Patient verbalizes understanding and grateful for the support. [name], [place], [place]

## 2019-02-15 NOTE — Progress Notes (Signed)
Advanced Heart Failure Rounding Note  PCP-Cardiologist: No primary care provider on file.   Subjective:    Underwent emergent cath on 1/7 for co-ox in the 20s. However cath with co-ox in 60-70 range so Impella placement deferred. Coronaries clean.   Milrinone added and co-ox much improved. Now on DBA 1 and milrinone 0.375. Co-ox 75%  Feeling much better. Denies SOB, orthopnea or PND. On po lasix now.   Weight down 20 pounds total.   Swan numbers done personally this am RA 8 PA 35/16 (26) PCWP 8 Thermo 4.2/2.6 SVR 1476 PVR 4.5  Objective:   Weight Range: 49.6 kg Body mass index is 16.63 kg/m.   Vital Signs:   Temp:  [97.9 F (36.6 C)-99.1 F (37.3 C)] 98.1 F (36.7 C) (01/11 0804) Pulse Rate:  [97-120] 101 (01/11 0804) Resp:  [0-25] 15 (01/11 0804) BP: (91-108)/(61-83) 96/80 (01/11 0800) SpO2:  [93 %-100 %] 100 % (01/11 0804) Weight:  [49.6 kg] 49.6 kg (01/11 0500) Last BM Date: 02/14/19  Weight change: Filed Weights   02/12/19 0500 02/13/19 0500 02/15/19 0500  Weight: 53.8 kg 55.6 kg 49.6 kg    Intake/Output:   Intake/Output Summary (Last 24 hours) at 02/15/2019 0917 Last data filed at 02/15/2019 0700 Gross per 24 hour  Intake 2666.39 ml  Output 4600 ml  Net -1933.61 ml      Physical Exam    General:  Thin male sitting in chair HEENT: normal Neck: supple.RIJ swan Carotids 2+ bilat; no bruits. No lymphadenopathy or thryomegaly appreciated. Cor: PMI nondisplaced. Tachy regular + s3 Lungs: clear Abdomen: soft, nontender, nondistended. No hepatosplenomegaly. No bruits or masses. Good bowel sounds. Extremities: no cyanosis, clubbing, rash, edema Neuro: alert & orientedx3, cranial nerves grossly intact. moves all 4 extremities w/o difficulty. Affect pleasant   Telemetry   Sinus Tach 100-110 Personally reviewed  Labs    CBC Recent Labs    02/15/19 0402  WBC 8.1  HGB 11.8*  HCT 35.2*  MCV 100.0  PLT 810*   Basic Metabolic Panel Recent  Labs    02/13/19 0330 02/14/19 0421 02/15/19 0402  NA 133* 132* 133*  K 3.6 3.7 3.5  CL 98 95* 96*  CO2 26 28 28   GLUCOSE 113* 106* 99  BUN 16 17 21*  CREATININE 1.32* 0.95 0.99  CALCIUM 8.5* 8.5* 9.0  MG 1.8  --  1.2*   Liver Function Tests No results for input(s): AST, ALT, ALKPHOS, BILITOT, PROT, ALBUMIN in the last 72 hours. No results for input(s): LIPASE, AMYLASE in the last 72 hours. Cardiac Enzymes No results for input(s): CKTOTAL, CKMB, CKMBINDEX, TROPONINI in the last 72 hours.  BNP: BNP (last 3 results) Recent Labs    02/10/19 0219  BNP 2,048.6*    ProBNP (last 3 results) No results for input(s): PROBNP in the last 8760 hours.   D-Dimer No results for input(s): DDIMER in the last 72 hours. Hemoglobin A1C No results for input(s): HGBA1C in the last 72 hours. Fasting Lipid Panel No results for input(s): CHOL, HDL, LDLCALC, TRIG, CHOLHDL, LDLDIRECT in the last 72 hours. Thyroid Function Tests No results for input(s): TSH, T4TOTAL, T3FREE, THYROIDAB in the last 72 hours.  Invalid input(s): FREET3  Other results:   Imaging    No results found.   Medications:     Scheduled Medications: . Chlorhexidine Gluconate Cloth  6 each Topical Daily  . dextromethorphan-guaiFENesin  1 tablet Oral BID  . digoxin  0.125 mg Oral Daily  .  enoxaparin (LOVENOX) injection  40 mg Subcutaneous Q24H  . feeding supplement (ENSURE ENLIVE)  237 mL Oral TID BM  . furosemide  40 mg Oral BID  . potassium chloride  40 mEq Oral Q4H  . sodium chloride flush  10-40 mL Intracatheter Q12H  . sodium chloride flush  10-40 mL Intracatheter Q12H  . sodium chloride flush  3 mL Intravenous Q12H  . sodium chloride flush  3 mL Intravenous Q12H  . sodium chloride flush  3 mL Intravenous Q12H  . spironolactone  12.5 mg Oral QHS    Infusions: . sodium chloride Stopped (02/10/19 1600)  . sodium chloride Stopped (02/12/19 1659)  . DOBUTamine 1 mcg/kg/min (02/15/19 0700)  . magnesium  sulfate bolus IVPB    . milrinone 0.375 mcg/kg/min (02/15/19 0700)    PRN Medications: sodium chloride, sodium chloride, acetaminophen, ALPRAZolam, ondansetron (ZOFRAN) IV, sodium chloride flush, sodium chloride flush, sodium chloride flush, sodium chloride flush, zolpidem     Assessment/Plan   1. Acute/Chronic Systolic HF -> cardiogenic shock - ECHO at [facility] EF , 20%.  - ECHO repeated and showed severely reduced EF <20%. RV only mildly down - R/L cath 1/7 normal cors. Output ok  - Co-ox 75% on DBA 1 and milrinone 0.375. Will stop DBA - Swan numbers this am done personally. Look good. SVR still up - Diuresing very well. Volume improving. Continue lasix 40 po bid - Continue digoxin 0.125 mg daily.  - Increase spiro to 25 mg daily.  - Add losartan 12.5 qhs as BP tolerates - Can remove swan. Possibly to 2C later today or tomorrow - VAD w/u underway. As cardiomyopathy seems relatively new-onset over past few weeks, I would like to give him a bit of time to recover but if we cannot stabilize we may have to move to mechanical support sooner. Suspect he will need home milrinone  2. Cough CXR- ? PNA.  - Procalcitonin 0.38>0.44>0.69 - Treating rocephin+ doxycycline. Wil finish today  - Covid neg.  - sputum cx NGTD  3. ETOH Abuse -Drinks alcohol daily. ~6 pack per day.   4. Cocaine  -UDS was + at [facility]  -He denies cocaine use.   5. Atrial tach - had several hour episode of atrial tach post cath. Resolved with IV amio. Amio stopped.  6. Hypokalemia/hypomag - K 3.5  Mg 1.2 supp    CRITICAL CARE Performed by: Arvilla Meres  Total critical care time: 40 minutes  Critical care time was exclusive of separately billable procedures and treating other patients.  Critical care was necessary to treat or prevent imminent or life-threatening deterioration.  Critical care was time spent personally by me (independent of midlevel providers or residents) on the following  activities: development of treatment plan with patient and/or surrogate as well as nursing, discussions with consultants, evaluation of patient's response to treatment, examination of patient, obtaining history from patient or surrogate, ordering and performing treatments and interventions, ordering and review of laboratory studies, ordering and review of radiographic studies, pulse oximetry and re-evaluation of patient's condition.    Length of Stay: 6  Arvilla Meres, MD  02/15/2019, 9:17 AM  Advanced Heart Failure Team Pager [phone] (M-F; 7a - 4p)  Please contact [facility] for night-coverage after hours (4p -7a ) and weekends on [email or web address]

## 2019-02-15 NOTE — Progress Notes (Signed)
VASCULAR LAB PRELIMINARY  PRELIMINARY  PRELIMINARY  PRELIMINARY  Pre operative VAD study completed.    Preliminary report: See CV proc for preliminary results.   Kamaryn Grimley, RVT 02/15/2019, 6:21 PM

## 2019-02-16 ENCOUNTER — Inpatient Hospital Stay (HOSPITAL_COMMUNITY): Payer: Medicaid - Out of State

## 2019-02-16 DIAGNOSIS — Z515 Encounter for palliative care: Secondary | ICD-10-CM

## 2019-02-16 DIAGNOSIS — Z01818 Encounter for other preprocedural examination: Secondary | ICD-10-CM

## 2019-02-16 DIAGNOSIS — I509 Heart failure, unspecified: Secondary | ICD-10-CM

## 2019-02-16 LAB — COOXEMETRY PANEL
Carboxyhemoglobin: 1.3 % (ref 0.5–1.5)
Methemoglobin: 0.5 % (ref 0.0–1.5)
O2 Saturation: 59.8 %
Total hemoglobin: 11.7 g/dL — ABNORMAL LOW (ref 12.0–16.0)

## 2019-02-16 LAB — BASIC METABOLIC PANEL
Anion gap: 7 (ref 5–15)
BUN: 22 mg/dL — ABNORMAL HIGH (ref 6–20)
CO2: 30 mmol/L (ref 22–32)
Calcium: 9.1 mg/dL (ref 8.9–10.3)
Chloride: 97 mmol/L — ABNORMAL LOW (ref 98–111)
Creatinine, Ser: 0.84 mg/dL (ref 0.61–1.24)
GFR calc Af Amer: >60 mL/min (ref >60)
GFR calc non Af Amer: >60 mL/min (ref >60)
Glucose, Bld: 108 mg/dL — ABNORMAL HIGH (ref 70–99)
Potassium: 3.9 mmol/L (ref 3.5–5.1)
Sodium: 134 mmol/L — ABNORMAL LOW (ref 135–145)

## 2019-02-16 LAB — MAGNESIUM: Magnesium: 1.7 mg/dL (ref 1.7–2.4)

## 2019-02-16 LAB — FACTOR 5 LEIDEN

## 2019-02-16 MED ORDER — MAGNESIUM SULFATE 50 % IJ SOLN
3.0000 g | Freq: Once | INTRAVENOUS | Status: AC
  Administered 2019-02-16: 3 g via INTRAVENOUS
  Filled 2019-02-16: qty 6

## 2019-02-16 MED ORDER — POTASSIUM CHLORIDE CRYS ER 20 MEQ PO TBCR
40.0000 meq | EXTENDED_RELEASE_TABLET | Freq: Once | ORAL | Status: AC
  Administered 2019-02-16: 40 meq via ORAL
  Filled 2019-02-16: qty 2

## 2019-02-16 MED ORDER — MILRINONE LACTATE IN DEXTROSE 20-5 MG/100ML-% IV SOLN
0.2500 ug/kg/min | INTRAVENOUS | Status: DC
  Administered 2019-02-16 – 2019-02-18 (×3): 0.25 ug/kg/min via INTRAVENOUS
  Filled 2019-02-16 (×2): qty 100

## 2019-02-16 NOTE — Progress Notes (Addendum)
Advanced Heart Failure Rounding Note  PCP-Cardiologist: No primary care provider on file.   Subjective:    34 y.o. 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  Underwent emergent cath on 1/7 for co-ox in the 20s. However cath with co-ox in 60-70 range so Impella placement deferred. Coronaries clean.   Milrinone added and co-ox much improved. DBA discontinued yesterday. Remains on milrinone 0.375. Co-ox 60% today.   On PO lasix 40 mg bid. -2.7L in UOP.  Weight down 16 pounds total.   Swan remains in place [place] #s  CVP 3-5 PAP 30/18 CO 4.9 CI 3.1  Feels ok this AM. BP soft but tolerating losartan addition w/o symptoms. SCr and K stable.   Objective:   Weight Range: 48.7 kg Body mass index is 16.32 kg/m.   Vital Signs:   Temp:  [97.9 F (36.6 C)-99 F (37.2 C)] 98.2 F (36.8 C) (01/12 0800) Pulse Rate:  [48-116] 111 (01/12 0800) Resp:  [6-29] 13 (01/12 0800) BP: (81-114)/(53-85) 92/77 (01/12 0800) SpO2:  [91 %-100 %] 100 % (01/12 0800) Weight:  [48.7 kg] 48.7 kg (01/12 0400) Last BM Date: 02/15/19  Weight change: Filed Weights   02/13/19 0500 02/15/19 0500 02/16/19 0400  Weight: 55.6 kg 49.6 kg 48.7 kg    Intake/Output:   Intake/Output Summary (Last 24 hours) at 02/16/2019 0842 Last data filed at 02/16/2019 0800 Gross per 24 hour  Intake 375.02 ml  Output 2751 ml  Net -2375.98 ml      Physical Exam    General:  Thin young AAM male. No distress HEENT: normal Neck: supple.+ RIJ swan Carotids 2+ bilat; no bruits. No lymphadenopathy or thryomegaly appreciated. Cor: PMI nondisplaced. Tachy regular + s3 Lungs: clear Abdomen: soft, nontender, nondistended. No hepatosplenomegaly. No bruits or masses. Good bowel sounds. Extremities: no cyanosis, clubbing, rash, thin extremities, no edema Neuro: alert & orientedx3, cranial nerves grossly intact. moves all 4 extremities w/o difficulty. Affect pleasant   Telemetry   Sinus Tach 107,  5 beats NSVT Personally reviewed  Labs    CBC Recent Labs    02/15/19 0402  WBC 8.1  HGB 11.8*  HCT 35.2*  MCV 100.0  PLT 149*   Basic Metabolic Panel Recent Labs    16/10/96 0402 02/16/19 0656  NA 133* 134*  K 3.5 3.9  CL 96* 97*  CO2 28 30  GLUCOSE 99 108*  BUN 21* 22*  CREATININE 0.99 0.84  CALCIUM 9.0 9.1  MG 1.2*  --    Liver Function Tests No results for input(s): AST, ALT, ALKPHOS, BILITOT, PROT, ALBUMIN in the last 72 hours. No results for input(s): LIPASE, AMYLASE in the last 72 hours. Cardiac Enzymes No results for input(s): CKTOTAL, CKMB, CKMBINDEX, TROPONINI in the last 72 hours.  BNP: BNP (last 3 results) Recent Labs    02/10/19 0219  BNP 2,048.6*    ProBNP (last 3 results) No results for input(s): PROBNP in the last 8760 hours.   D-Dimer No results for input(s): DDIMER in the last 72 hours. Hemoglobin A1C No results for input(s): HGBA1C in the last 72 hours. Fasting Lipid Panel No results for input(s): CHOL, HDL, LDLCALC, TRIG, CHOLHDL, LDLDIRECT in the last 72 hours. Thyroid Function Tests No results for input(s): TSH, T4TOTAL, T3FREE, THYROIDAB in the last 72 hours.  Invalid input(s): FREET3  Other results:   Imaging    VAS US DOPPLER PRE VAD  Result Date: 02/15/2019 PERIOPERATIVE VASCULAR EVALUATION Indications:  Preoperative VAD. Other Factors:    Polysubstance abuse, Dilated cardiomyopathy, Limitations:      line in right neck, abnormal waveforms and heart rate Comparison Study: No prior study Performing Technologist: Sherren Kerns RVS  Examination Guidelines: A complete evaluation includes B-mode imaging, spectral Doppler, color Doppler, and power Doppler as needed of all accessible portions of each vessel. Bilateral testing is considered an integral part of a complete examination. Limited examinations for reoccurring indications may be performed as noted.  Right Carotid Findings:  +----------+--------+--------+--------+------------+--------------+           PSV cm/sEDV cm/sStenosisDescribe    Comments       +----------+--------+--------+--------+------------+--------------+ CCA Prox                                      Not visualized +----------+--------+--------+--------+------------+--------------+ CCA Mid                                       Not visualized +----------+--------+--------+--------+------------+--------------+ CCA Distal                                    Not visualized +----------+--------+--------+--------+------------+--------------+ ICA Prox  54      29              heterogenous               +----------+--------+--------+--------+------------+--------------+ ICA Distal63      28                                         +----------+--------+--------+--------+------------+--------------+ ECA       107                                                +----------+--------+--------+--------+------------+--------------+ Portions of this table do not appear on this page.  Left Carotid Findings: +----------+--------+--------+--------+--------+--------+           PSV cm/sEDV cm/sStenosisDescribeComments +----------+--------+--------+--------+--------+--------+ CCA Prox  105                                      +----------+--------+--------+--------+--------+--------+ CCA Distal90                                       +----------+--------+--------+--------+--------+--------+ ICA Prox  39                                       +----------+--------+--------+--------+--------+--------+ ICA Distal68                                       +----------+--------+--------+--------+--------+--------+ ECA       75                                       +----------+--------+--------+--------+--------+--------+ +----------+--------+--------+--------+------------+  SubclavianPSV cm/sEDV cm/sDescribeArm  Pressure +----------+--------+--------+--------+------------+           61                      92           +----------+--------+--------+--------+------------+ +---------+--------+--+--------+ VertebralPSV cm/s50EDV cm/s +---------+--------+--+--------+  ABI Findings: +--------+------------------+-----+---------+----------+ Right   Rt Pressure (mmHg)IndexWaveform Comment    +--------+------------------+-----+---------+----------+ Brachial                       triphasicrestricted +--------+------------------+-----+---------+----------+ PTA     106               1.15 triphasic           +--------+------------------+-----+---------+----------+ DP      100               1.09 triphasic           +--------+------------------+-----+---------+----------+ +--------+------------------+-----+---------+-------+ Left    Lt Pressure (mmHg)IndexWaveform Comment +--------+------------------+-----+---------+-------+ [id number]                     triphasic        +--------+------------------+-----+---------+-------+ PTA     112               1.22 triphasic        +--------+------------------+-----+---------+-------+ DP      107               1.16 triphasic        +--------+------------------+-----+---------+-------+ +-------+---------------+----------------+ ABI/TBIToday's ABI/TBIPrevious ABI/TBI +-------+---------------+----------------+ Right  1.15                            +-------+---------------+----------------+ Left   1.2                             +-------+---------------+----------------+  Summary: Right Carotid: The extracranial vessels were near-normal with only minimal wall                thickening or plaque. Left Carotid: The extracranial vessels were near-normal with only minimal wall               thickening or plaque. Vertebrals:  Left vertebral artery demonstrates antegrade flow. Right vertebral              artery was not visualized.  Subclavians: Right subclavian artery was not visualized. Normal flow              hemodynamics were seen in the left subclavian artery.  *See table(s) above for measurements and observations. Right ABI: Resting right ankle-brachial index is within normal range. No evidence of significant right lower extremity arterial disease. Left ABI: Resting left ankle-brachial index is within normal range. No evidence of significant left lower extremity arterial disease.     Preliminary    VAS [place] LOWER EXTREMITY VENOUS (DVT)  Result Date: 02/15/2019  Lower Venous Study Indications: Pre-op, and VAD workup.  Comparison Study: No prior study on file for comparison Performing Technologist: Sherren Kerns RVS  Examination Guidelines: A complete evaluation includes B-mode imaging, spectral Doppler, color Doppler, and power Doppler as needed of all accessible portions of each vessel. Bilateral testing is considered an integral part of a complete examination. Limited examinations for reoccurring indications may be performed as noted.  +---------+---------------+---------+-----------+----------+--------------+ RIGHT    CompressibilityPhasicitySpontaneityPropertiesThrombus Aging +---------+---------------+---------+-----------+----------+--------------+ CFV      Full  pulsatile      +---------+---------------+---------+-----------+----------+--------------+ SFJ      Full                                                        +---------+---------------+---------+-----------+----------+--------------+ FV Prox  Full                                                        +---------+---------------+---------+-----------+----------+--------------+ FV Mid   Full                                                        +---------+---------------+---------+-----------+----------+--------------+ FV DistalFull                                                         +---------+---------------+---------+-----------+----------+--------------+ PFV      Full                                                        +---------+---------------+---------+-----------+----------+--------------+ POP      Full                                         pulsatile      +---------+---------------+---------+-----------+----------+--------------+ PTV      Full                                                        +---------+---------------+---------+-----------+----------+--------------+ PERO     Full                                                        +---------+---------------+---------+-----------+----------+--------------+   +---------+---------------+---------+-----------+----------+--------------+ LEFT     CompressibilityPhasicitySpontaneityPropertiesThrombus Aging +---------+---------------+---------+-----------+----------+--------------+ CFV      Full                                         pulsatile      +---------+---------------+---------+-----------+----------+--------------+ SFJ      Full                                                        +---------+---------------+---------+-----------+----------+--------------+  FV Prox  Full                                                        +---------+---------------+---------+-----------+----------+--------------+ FV Mid   Full                                                        +---------+---------------+---------+-----------+----------+--------------+ FV DistalFull                                                        +---------+---------------+---------+-----------+----------+--------------+ PFV      Full                                                        +---------+---------------+---------+-----------+----------+--------------+ POP      Full                                         pulsatile       +---------+---------------+---------+-----------+----------+--------------+ PTV      Full                                                        +---------+---------------+---------+-----------+----------+--------------+ PERO     Full                                                        +---------+---------------+---------+-----------+----------+--------------+     Summary: Right: There is no evidence of deep vein thrombosis in the lower extremity. Left: There is no evidence of deep vein thrombosis in the lower extremity.  *See table(s) above for measurements and observations.    Preliminary      Medications:     Scheduled Medications: . Chlorhexidine Gluconate Cloth  6 each Topical Daily  . dextromethorphan-guaiFENesin  1 tablet Oral BID  . digoxin  0.125 mg Oral Daily  . enoxaparin (LOVENOX) injection  40 mg Subcutaneous Q24H  . feeding supplement (ENSURE ENLIVE)  237 mL Oral TID BM  . furosemide  40 mg Oral BID  . losartan  12.5 mg Oral QHS  . sodium chloride flush  10-40 mL Intracatheter Q12H  . sodium chloride flush  10-40 mL Intracatheter Q12H  . sodium chloride flush  3 mL Intravenous Q12H  . sodium chloride flush  3 mL Intravenous Q12H  . sodium chloride flush  3 mL Intravenous Q12H  . spironolactone  25 mg Oral QHS  Infusions: . sodium chloride Stopped (02/10/19 1600)  . sodium chloride Stopped (02/12/19 1659)  . milrinone 0.375 mcg/kg/min (02/16/19 0800)    PRN Medications: sodium chloride, sodium chloride, acetaminophen, ALPRAZolam, ondansetron (ZOFRAN) IV, sodium chloride flush, sodium chloride flush, sodium chloride flush, sodium chloride flush, zolpidem     Assessment/Plan   1. Acute/Chronic Systolic HF -> cardiogenic shock - ECHO at [facility] EF, 20%.  - ECHO repeated and showed severely reduced EF <20%. RV only mildly down - R/L cath 1/7 normal cors. Output ok  - Co-ox 60% off of DBA. Remains on milrinone 0.375.  - Diuresing very well. Volume  improving. CVP now 3-5. BP soft. Will hold evening dose of  PO Lasix. Reassess in the AM.  - Continue digoxin 0.125 mg daily.  - Continue spiro to 25 mg qhs - Continue losartan 12.5 qhs as BP tolerates. No room for further titration yet. - VAD w/u underway. As cardiomyopathy seems relatively new-onset over past few weeks, I would like to give him a bit of time to recover but if we cannot stabilize we may have to move to mechanical support sooner. Suspect he will need home milrinone  2. Cough CXR- ? PNA.  - Procalcitonin 0.38>0.44>0.69 - Treated w/ rocephin+ doxycycline x 6 days. Course completed 1/11.  - Covid neg.  - sputum cx NGTD  3. ETOH Abuse -Drinks alcohol daily. ~6 pack per day.   4. Cocaine  -UDS was + at [facility]  -He denies cocaine use.   5. Atrial tach - had several hour episode of atrial tach post cath. Resolved with IV amio. Amio stopped.  6. Hypokalemia/hypomag - K 3.9  Mg lab pending. supp    Length of Stay: [address] PA-C  02/16/2019, 8:42 AM  Advanced Heart Failure Team Pager [phone] (M-F; 7a - 4p)  Please contact [facility] for night-coverage after hours (4p -7a ) and weekends on [email or web address]  Agree with above.   Remains on milrinone for inotropic support. Ernestine Conrad numbers reviewed personally. Much improved on milrinone. Volume status ok. CXR obtained today and viewed personally and small bilateral pleural effusions R> L. Seen by IR who did u/s and not large enough to tap. Co-ox 60%  General:  Thin No resp difficulty HEENT: normal Neck: supple. RIJ swan Carotids 2+ bilat; no bruits. No lymphadenopathy or thryomegaly appreciated. Cor: PMI laterally displaced. Regular rate & rhythm. No rubs, gallops or murmurs. Lungs: clear decreased at bases.  Abdomen: soft, nontender, nondistended. No hepatosplenomegaly. No bruits or masses. Good bowel sounds. Extremities: no cyanosis, clubbing, rash, edema Neuro: alert & orientedx3, cranial nerves  grossly intact. moves all 4 extremities w/o difficulty. Affect pleasant  Will remove swan (keep PICC). Titrate milrinone down to 0.25. Continue losartan, spiro and dig. BP too low to titrate further. Will need slow wean of milrinone. Has previously failed dobutamine so may need short course of home inotropes to give LV chance to recover. Given recent onset of cardiomyopathy would like to give LV time to recover if possible before moving to advanced therapies. Continue to follow closely. Supp mag.   CRITICAL CARE Performed by: Arvilla Meres  Total critical care time: 35 minutes  Critical care time was exclusive of separately billable procedures and treating other patients.  Critical care was necessary to treat or prevent imminent or life-threatening deterioration.  Critical care was time spent personally by me (independent of midlevel providers or residents) on the following activities: development of treatment plan with patient and/or surrogate  as well as nursing, discussions with consultants, evaluation of patient's response to treatment, examination of patient, obtaining history from patient or surrogate, ordering and performing treatments and interventions, ordering and review of laboratory studies, ordering and review of radiographic studies, pulse oximetry and re-evaluation of patient's condition.  Arvilla Meres, MD  1:06 PM

## 2019-02-16 NOTE — Progress Notes (Signed)
Request for IR thora. Limited US (R) chest. Trace to small effusion noted.  Too small for safe percutaneous thoracentesis. Procedure not perfomred  Brayton El PA-C Interventional Radiology 02/16/2019 11:56 AM

## 2019-02-16 NOTE — Progress Notes (Signed)
Patient ID: Jon Martin, male   DOB: Jun 28, 1985, 34 y.o.   MRN: [id number]  This NP visited patient at  bedside as a follow up to last weeks GOCs meeting with Yong Channel NP with PMT , as part of LVAD evaluation.   I am following up this week.      I introduced myself to the patient and sat at bedside for continued emotional support and conversation regarding his opportunity for LVAD therapy and  consideration of LVAD therapy.  He is engaged and verbalizes his apprehension regarding consideration of LVAD therapy.  He understands the seriousness of his current medical situation.  He struggles with " just putting it in God's hands", going home and seeing how things play out.  He actually tells me a story from the Bible where a woman's child was sick/dying, everyone told her to take the child to the doctor but she chose to go home into a closet.  She simply went home and the child recovered fully..  We discussed that indeed this is a big decision that will alter his  Life in some ways .  We  discussed that the hope for LVAD therapy was to offer him continued quality of life for many years.  I suggested that he continue to talk with the chaplain, and to reflect and pray on his decision.  Discussed with Genesis Adams / Chaplain  Questions and concerns addressed   Patient is open to continued conversation and visits from this nurse practitioner.   Palliative medicine team will continue to support holistically.  Total time spent on the unit was 35 minutes  Greater than 50% of the time was spent in counseling and coordination of care  Lorinda Creed NP  Palliative Medicine Team Team Phone # [phone] Pager [phone]

## 2019-02-16 NOTE — Progress Notes (Signed)
CT Surgery PM Rounds  Stable titrating HF meds R effusion now too small to drain Sinus tach, CVP < 10 Last CI > 3.0

## 2019-02-17 DIAGNOSIS — I509 Heart failure, unspecified: Secondary | ICD-10-CM

## 2019-02-17 LAB — BASIC METABOLIC PANEL
Anion gap: 9 (ref 5–15)
BUN: 24 mg/dL — ABNORMAL HIGH (ref 6–20)
CO2: 26 mmol/L (ref 22–32)
Calcium: 9.4 mg/dL (ref 8.9–10.3)
Chloride: 96 mmol/L — ABNORMAL LOW (ref 98–111)
Creatinine, Ser: 0.77 mg/dL (ref 0.61–1.24)
GFR calc Af Amer: >60 mL/min (ref >60)
GFR calc non Af Amer: >60 mL/min (ref >60)
Glucose, Bld: 106 mg/dL — ABNORMAL HIGH (ref 70–99)
Potassium: 4.1 mmol/L (ref 3.5–5.1)
Sodium: 131 mmol/L — ABNORMAL LOW (ref 135–145)

## 2019-02-17 LAB — MAGNESIUM: Magnesium: 1.8 mg/dL (ref 1.7–2.4)

## 2019-02-17 LAB — COOXEMETRY PANEL
Carboxyhemoglobin: 1.5 % (ref 0.5–1.5)
Methemoglobin: 0.9 % (ref 0.0–1.5)
O2 Saturation: 57.5 %
Total hemoglobin: 11.6 g/dL — ABNORMAL LOW (ref 12.0–16.0)

## 2019-02-17 MED ORDER — MAGNESIUM SULFATE 2 GM/50ML IV SOLN
2.0000 g | Freq: Once | INTRAVENOUS | Status: AC
  Administered 2019-02-17: 2 g via INTRAVENOUS
  Filled 2019-02-17: qty 50

## 2019-02-17 MED ORDER — ADULT MULTIVITAMIN W/MINERALS CH
1.0000 | ORAL_TABLET | Freq: Every day | ORAL | Status: DC
  Administered 2019-02-17 – 2019-02-25 (×9): 1 via ORAL
  Filled 2019-02-17 (×9): qty 1

## 2019-02-17 MED ORDER — FUROSEMIDE 40 MG PO TABS
40.0000 mg | ORAL_TABLET | Freq: Every day | ORAL | Status: DC
  Administered 2019-02-17: 40 mg via ORAL
  Filled 2019-02-17: qty 1

## 2019-02-17 NOTE — Progress Notes (Signed)
 Nutrition Follow-up  DOCUMENTATION CODES:   Underweight, Severe malnutrition in context of chronic illness  INTERVENTION:   Ensure Enlive po TID, each supplement provides 350 kcal and 20 grams of protein    NUTRITION DIAGNOSIS:   Severe Malnutrition related to chronic illness(CHF) as evidenced by severe fat depletion, severe muscle depletion.  Being addressed via supplements  GOAL:   Patient will meet greater than or equal to 90% of their needs  Progressing  MONITOR:   PO intake, Supplement acceptance, Labs, Weight trends, I & O's  REASON FOR ASSESSMENT:   Consult LVAD Eval  ASSESSMENT:   34 year old male with PMH of NIDCM, EF 20%, LifeVest, EtOH abuse, drug abuse. Pt transferred from [facility] for consideration for LVAD.   01/06 - PICC placed 01/07 - RHC/LHC, Nonnie Done remains in place Medically managing currently, no plan for LVAD currently, workup continues  Pt reports very good appetite, ate 100% at breakfast this AM. Recorded po intake 75-100% of meals.  Requesting an Ensure; pt reports he is drinking Ensure well.   Current weight 49.9 kg; admit weight 56.3 kg. Net negative 9 L.   Labs: sodium 131 (L), Creatinine wdl Meds: lasix, MVI with Minerals  Diet Order:   Diet Order            Diet Heart Room service appropriate? Yes; Fluid consistency: Thin  Diet effective now              EDUCATION NEEDS:   Education needs have been addressed  Skin:  Skin Assessment: Reviewed RN Assessment  Last BM:  1/11  Height:   Ht Readings from Last 1 Encounters:  02/10/19 5\' 8"  (1.727 m)    Weight:   Wt Readings from Last 1 Encounters:  02/17/19 49.9 kg    Ideal Body Weight:  70 kg  BMI:  Body mass index is 16.73 kg/m.  Estimated Nutritional Needs:   Kcal:  2100-2300  Protein:  95-110 grams  Fluid:  >/= 1.8 L    Findley Blankenbaker MS, RDN, LDN, CNSC [phone] Pager  [phone] Weekend/On-Call Pager

## 2019-02-17 NOTE — Progress Notes (Addendum)
Advanced Heart Failure Rounding Note  PCP-Cardiologist: No primary care provider on file.   Subjective:    34 y.o. 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  Underwent emergent cath on 1/7 for co-ox in the 20s. However cath with co-ox in 60-70 range so Impella placement deferred. Coronaries clean.   Milrinone added and co-ox much improved. DBA discontinued 1/11. Weaning down milrinone, now on 0.25 mcg. Co-ox 58% today.   On PO lasix 40 mg once daily. -1.7L in UOP. CVP 8-9   No complaints today. Resting comfortably. Denies dyspnea. BP soft, low 90s systolic but asymptomatic.    Objective:   Weight Range: 49.9 kg Body mass index is 16.73 kg/m.   Vital Signs:   Temp:  [98.1 F (36.7 C)-99 F (37.2 C)] 98.1 F (36.7 C) (01/13 0820) Pulse Rate:  [104-231] 109 (01/13 0900) Resp:  [8-42] 18 (01/13 0900) BP: (77-102)/(49-82) 77/52 (01/13 0900) SpO2:  [92 %-100 %] 100 % (01/13 0900) Weight:  [49.9 kg] 49.9 kg (01/13 0500) Last BM Date: 02/15/19  Weight change: Filed Weights   02/15/19 0500 02/16/19 0400 02/17/19 0500  Weight: 49.6 kg 48.7 kg 49.9 kg    Intake/Output:   Intake/Output Summary (Last 24 hours) at 02/17/2019 7341 Last data filed at 02/17/2019 0900 Gross per 24 hour  Intake 1540.87 ml  Output 1125 ml  Net 415.87 ml      Physical Exam    CVP 8-9 General:  young AAM male. No distress HEENT: normal Neck: supple. No JVD Carotids 2+ bilat; no bruits. No lymphadenopathy or thryomegaly appreciated. Cor: PMI nondisplaced. Tachy regular + s3 Lungs: clear Abdomen: soft, nontender, nondistended. No hepatosplenomegaly. No bruits or masses. Good bowel sounds. Extremities: no cyanosis, clubbing, rash, thin extremities, no edema Neuro: alert & orientedx3, cranial nerves grossly intact. moves all 4 extremities w/o difficulty. Affect pleasant   Telemetry   Sinus Tach low 100s-110s. No arrhythmias w/ milrinone Personally  reviewed  Labs    CBC Recent Labs    02/15/19 0402  WBC 8.1  HGB 11.8*  HCT 35.2*  MCV 100.0  PLT 149*   Basic Metabolic Panel Recent Labs    93/79/02 0656 02/17/19 0220  NA 134* 131*  K 3.9 4.1  CL 97* 96*  CO2 30 26  GLUCOSE 108* 106*  BUN 22* 24*  CREATININE 0.84 0.77  CALCIUM 9.1 9.4  MG 1.7 1.8   Liver Function Tests No results for input(s): AST, ALT, ALKPHOS, BILITOT, PROT, ALBUMIN in the last 72 hours. No results for input(s): LIPASE, AMYLASE in the last 72 hours. Cardiac Enzymes No results for input(s): CKTOTAL, CKMB, CKMBINDEX, TROPONINI in the last 72 hours.  BNP: BNP (last 3 results) Recent Labs    02/10/19 0219  BNP 2,048.6*    ProBNP (last 3 results) No results for input(s): PROBNP in the last 8760 hours.   D-Dimer No results for input(s): DDIMER in the last 72 hours. Hemoglobin A1C No results for input(s): HGBA1C in the last 72 hours. Fasting Lipid Panel No results for input(s): CHOL, HDL, LDLCALC, TRIG, CHOLHDL, LDLDIRECT in the last 72 hours. Thyroid Function Tests No results for input(s): TSH, T4TOTAL, T3FREE, THYROIDAB in the last 72 hours.  Invalid input(s): FREET3  Other results:   Imaging    DG CHEST PORT 1 VIEW  Result Date: 02/16/2019 CLINICAL DATA:  Pleural effusion. EXAM: PORTABLE CHEST 1 VIEW COMPARISON:  PA and lateral chest 02/10/2019. CT chest, abdomen and  pelvis 02/12/2019. FINDINGS: Right IJ approach Swan-Ganz catheter is in place with its tip in the distal right main pulmonary artery. Right greater than left small to moderate pleural effusions are unchanged. Lungs are clear. Heart size is upper normal. No pneumothorax. No acute bony abnormality. IMPRESSION: No change in small to moderate pleural effusions, larger on the right. Swan-Ganz catheter tip is in the distal right main pulmonary artery. Lungs clear. Electronically Signed   By: Drusilla Kanner M.D.   On: 02/16/2019 11:51   IR US CHEST  Result Date:  02/16/2019 CLINICAL DATA:  Shortness of breath. Please perform chest ultrasound and ultrasound-guided thoracentesis as indicated. EXAM: CHEST ULTRASOUND COMPARISON:  CT the chest, abdomen and pelvis-02/12/2019; chest radiograph-earlier same day FINDINGS: Sonographic evaluation of the right chest demonstrates a trace right-sided pleural effusion, too small to allow for safe ultrasound-guided thoracentesis. No thoracentesis attempted. IMPRESSION: Trace right-sided pleural effusion, too small to allow for safe ultrasound-guided thoracentesis. No thoracentesis attempted. Electronically Signed   By: Simonne Come M.D.   On: 02/16/2019 12:20     Medications:     Scheduled Medications: . Chlorhexidine Gluconate Cloth  6 each Topical Daily  . dextromethorphan-guaiFENesin  1 tablet Oral BID  . digoxin  0.125 mg Oral Daily  . enoxaparin (LOVENOX) injection  40 mg Subcutaneous Q24H  . feeding supplement (ENSURE ENLIVE)  237 mL Oral TID BM  . furosemide  40 mg Oral Daily  . losartan  12.5 mg Oral QHS  . sodium chloride flush  10-40 mL Intracatheter Q12H  . sodium chloride flush  10-40 mL Intracatheter Q12H  . sodium chloride flush  3 mL Intravenous Q12H  . sodium chloride flush  3 mL Intravenous Q12H  . sodium chloride flush  3 mL Intravenous Q12H  . spironolactone  25 mg Oral QHS    Infusions: . sodium chloride Stopped (02/10/19 1600)  . sodium chloride Stopped (02/12/19 1659)  . magnesium sulfate bolus IVPB    . milrinone 0.25 mcg/kg/min (02/17/19 0900)    PRN Medications: sodium chloride, sodium chloride, acetaminophen, ALPRAZolam, ondansetron (ZOFRAN) IV, sodium chloride flush, sodium chloride flush, sodium chloride flush, sodium chloride flush, zolpidem     Assessment/Plan   1. Acute/Chronic Systolic HF -> cardiogenic shock - ECHO at [facility] EF, 20%.  - ECHO repeated and showed severely reduced EF <20%. RV only mildly down - R/L cath 1/7 normal cors. Output ok  - Co-ox 58% on  milrinone 0.25 mcg. Will continue current dose today and will try to wean dose tomorrow.  - Volume improved w/ diuresis. CVP 8-9. BP soft. Continue PO Lasix 40 mg daily.   - Continue digoxin 0.125 mg daily.  - Continue spiro to 25 mg qhs - Continue losartan 12.5 qhs as BP tolerates. No room for further titration yet. - VAD w/u underway. As cardiomyopathy seems relatively new-onset over past few weeks, I would like to give him a bit of time to recover but if we cannot stabilize we may have to move to mechanical support sooner. Suspect he will need home milrinone  2. Cough CXR- ? PNA.  - Procalcitonin 0.38>0.44>0.69 - Treated w/ rocephin+ doxycycline x 6 days. Course completed 1/11.  - Covid neg.  - sputum cx NGTD  3. ETOH Abuse -Drinks alcohol daily. ~6 pack per day.   4. Cocaine  -UDS was + at [facility]  -He denies cocaine use.   5. Atrial tach - had several hour episode of atrial tach post cath. Resolved with IV  amio. Amio stopped.  6. Hypokalemia/hypomag - K 4.1  Mg 1.8  supp as needed    Length of Stay: [address] PA-C  02/17/2019, 9:22 AM  Advanced Heart Failure Team Pager [phone] (M-F; 7a - 4p)  Please contact [facility] for night-coverage after hours (4p -7a ) and weekends on [email or web address]  Patient seen and examined with the above-signed Advanced Practice Provider and/or Housestaff. I personally reviewed laboratory data, imaging studies and relevant notes. I independently examined the patient and formulated the important aspects of the plan. I have edited the note to reflect any of my changes or salient points. I have personally discussed the plan with the patient and/or family.  Remains on milrinone at 0.25. CVP 8-9 (checked personally). Feels weak but denies CP, SOB, orthopnea or PND. Pleural effusions u/s'd and not enough to tap. SBP remains low in 80s and 90s.   Will restart po lasix today. Attempt to ambulate. Will not wean milrinone today. Will  try to get to 0.125 tomorrow if co-oc > 55%. May need home inotropes. If decompensates will need to re-initiate VAD process.   Glori Bickers, MD  1:34 PM

## 2019-02-18 DIAGNOSIS — R570 Cardiogenic shock: Secondary | ICD-10-CM

## 2019-02-18 LAB — CBC
HCT: 33.3 % — ABNORMAL LOW (ref 39.0–52.0)
Hemoglobin: 10.6 g/dL — ABNORMAL LOW (ref 13.0–17.0)
MCH: 32.7 pg (ref 26.0–34.0)
MCHC: 31.8 g/dL (ref 30.0–36.0)
MCV: 102.8 fL — ABNORMAL HIGH (ref 80.0–100.0)
Platelets: 175 10*3/uL (ref 150–400)
RBC: 3.24 MIL/uL — ABNORMAL LOW (ref 4.22–5.81)
RDW: 15.8 % — ABNORMAL HIGH (ref 11.5–15.5)
WBC: 5.9 10*3/uL (ref 4.0–10.5)
nRBC: 0 % (ref 0.0–0.2)

## 2019-02-18 LAB — CULTURE, RESPIRATORY W GRAM STAIN

## 2019-02-18 LAB — BASIC METABOLIC PANEL
Anion gap: 9 (ref 5–15)
BUN: 24 mg/dL — ABNORMAL HIGH (ref 6–20)
CO2: 27 mmol/L (ref 22–32)
Calcium: 9.3 mg/dL (ref 8.9–10.3)
Chloride: 95 mmol/L — ABNORMAL LOW (ref 98–111)
Creatinine, Ser: 0.77 mg/dL (ref 0.61–1.24)
GFR calc Af Amer: >60 mL/min (ref >60)
GFR calc non Af Amer: >60 mL/min (ref >60)
Glucose, Bld: 121 mg/dL — ABNORMAL HIGH (ref 70–99)
Potassium: 3.7 mmol/L (ref 3.5–5.1)
Sodium: 131 mmol/L — ABNORMAL LOW (ref 135–145)

## 2019-02-18 LAB — COOXEMETRY PANEL
Carboxyhemoglobin: 1.4 % (ref 0.5–1.5)
Methemoglobin: 1 % (ref 0.0–1.5)
O2 Saturation: 52.1 %
Total hemoglobin: 10.8 g/dL — ABNORMAL LOW (ref 12.0–16.0)

## 2019-02-18 LAB — MAGNESIUM: Magnesium: 1.9 mg/dL (ref 1.7–2.4)

## 2019-02-18 MED ORDER — MAGNESIUM SULFATE IN D5W 1-5 GM/100ML-% IV SOLN
1.0000 g | Freq: Once | INTRAVENOUS | Status: AC
  Administered 2019-02-18: 1 g via INTRAVENOUS
  Filled 2019-02-18: qty 100

## 2019-02-18 MED ORDER — FUROSEMIDE 10 MG/ML IJ SOLN
80.0000 mg | Freq: Once | INTRAMUSCULAR | Status: AC
  Administered 2019-02-18: 80 mg via INTRAVENOUS
  Filled 2019-02-18: qty 8

## 2019-02-18 MED ORDER — POTASSIUM CHLORIDE CRYS ER 20 MEQ PO TBCR
40.0000 meq | EXTENDED_RELEASE_TABLET | Freq: Once | ORAL | Status: AC
  Administered 2019-02-18: 40 meq via ORAL
  Filled 2019-02-18: qty 2

## 2019-02-18 NOTE — Progress Notes (Addendum)
Advanced Heart Failure Rounding Note  PCP-Cardiologist: No primary care provider on file.   Subjective:    34 y.o. 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  Underwent emergent cath on 1/7 for co-ox in the 20s. However cath with co-ox in 60-70 range so Impella placement deferred. Coronaries clean.   Milrinone added and co-ox much improved. DBA discontinued 1/11.   Remains on milrinone 0.25 mcg. Todays CO-oX is 52%.   CVP trending back up today from ---> 13-14   Denies SOB. Denies nausea. Able to walk in the hall.    Objective:   Weight Range: 51.1 kg Body mass index is 17.13 kg/m.   Vital Signs:   Temp:  [97.9 F (36.6 C)-98.7 F (37.1 C)] 98 F (36.7 C) (01/14 0809) Pulse Rate:  [53-144] 124 (01/14 0800) Resp:  [6-41] 9 (01/14 0800) BP: (77-103)/(52-82) 95/75 (01/14 0800) SpO2:  [51 %-100 %] 100 % (01/14 0800) Weight:  [51.1 kg] 51.1 kg (01/14 0500) Last BM Date: 02/15/19  Weight change: Filed Weights   02/16/19 0400 02/17/19 0500 02/18/19 0500  Weight: 48.7 kg 49.9 kg 51.1 kg    Intake/Output:   Intake/Output Summary (Last 24 hours) at 02/18/2019 0834 Last data filed at 02/18/2019 0800 Gross per 24 hour  Intake 1188.57 ml  Output 1225 ml  Net -36.43 ml      Physical Exam   CVP 13-14 personally checked.  General:   No resp difficulty HEENT: normal anicteric  Neck: supple. 11-12 JVP . Carotids 2+ bilat; no bruits. No lymphadenopathy or thryomegaly appreciated. Cor: PMI laterally displaced. Tachy regular. No rubs,  or murmurs. + S3  Lungs: clear no wheeze  Abdomen: soft, nontender, nondistended. No hepatosplenomegaly. No bruits or masses. Good bowel sounds. Extremities: no cyanosis, clubbing, rash, edema. RUE PICC  Neuro: alert & oriented x 3, cranial nerves grossly intact. moves all 4 extremities w/o difficulty. Affect pleasant   Telemetry   Sinus Tach 120-130s  personally reviewed.   Labs    CBC Recent Labs   02/18/19 0248  WBC 5.9  HGB 10.6*  HCT 33.3*  MCV 102.8*  PLT 175   Basic Metabolic Panel Recent Labs    34/74/25 0220 02/18/19 0248  NA 131* 131*  K 4.1 3.7  CL 96* 95*  CO2 26 27  GLUCOSE 106* 121*  BUN 24* 24*  CREATININE 0.77 0.77  CALCIUM 9.4 9.3  MG 1.8 1.9   Liver Function Tests No results for input(s): AST, ALT, ALKPHOS, BILITOT, PROT, ALBUMIN in the last 72 hours. No results for input(s): LIPASE, AMYLASE in the last 72 hours. Cardiac Enzymes No results for input(s): CKTOTAL, CKMB, CKMBINDEX, TROPONINI in the last 72 hours.  BNP: BNP (last 3 results) Recent Labs    02/10/19 0219  BNP 2,048.6*    ProBNP (last 3 results) No results for input(s): PROBNP in the last 8760 hours.   D-Dimer No results for input(s): DDIMER in the last 72 hours. Hemoglobin A1C No results for input(s): HGBA1C in the last 72 hours. Fasting Lipid Panel No results for input(s): CHOL, HDL, LDLCALC, TRIG, CHOLHDL, LDLDIRECT in the last 72 hours. Thyroid Function Tests No results for input(s): TSH, T4TOTAL, T3FREE, THYROIDAB in the last 72 hours.  Invalid input(s): FREET3  Other results:   Imaging    No results found.   Medications:     Scheduled Medications: . Chlorhexidine Gluconate Cloth  6 each Topical Daily  . dextromethorphan-guaiFENesin  1  tablet Oral BID  . digoxin  0.125 mg Oral Daily  . enoxaparin (LOVENOX) injection  40 mg Subcutaneous Q24H  . feeding supplement (ENSURE ENLIVE)  237 mL Oral TID BM  . furosemide  40 mg Oral Daily  . losartan  12.5 mg Oral QHS  . multivitamin with minerals  1 tablet Oral Daily  . sodium chloride flush  10-40 mL Intracatheter Q12H  . sodium chloride flush  10-40 mL Intracatheter Q12H  . sodium chloride flush  3 mL Intravenous Q12H  . sodium chloride flush  3 mL Intravenous Q12H  . sodium chloride flush  3 mL Intravenous Q12H  . spironolactone  25 mg Oral QHS    Infusions: . sodium chloride Stopped (02/10/19 1600)  .  sodium chloride Stopped (02/18/19 0252)  . milrinone 0.25 mcg/kg/min (02/18/19 0800)    PRN Medications: sodium chloride, sodium chloride, acetaminophen, ALPRAZolam, ondansetron (ZOFRAN) IV, sodium chloride flush, sodium chloride flush, sodium chloride flush, sodium chloride flush, zolpidem     Assessment/Plan   1. Acute/Chronic Systolic HF -> cardiogenic shock - ECHO at [facility] EF, 20%.  - ECHO repeated and showed severely reduced EF <20%. RV only mildly down - R/L cath 1/7 normal cors. Output ok  - CO-OX 52% on milrinone 0.25 mcg. Tachy today. May need to add 5 mg ivabradine.  - CVP up 13-14. Stop po lasix and give one time dose of 80 mg IV lasix.  - Continue digoxin 0.125 mg daily.  - Continue spiro to 25 mg qhs - Continue losartan 12.5 qhs as BP tolerates. No room for further titration yet. - VAD w/u underway. As cardiomyopathy seems relatively new-onset over past few weeks, I would like to give him a bit of time to recover but if we cannot stabilize we may have to move to mechanical support sooner. Suspect he will need home milrinone  2. Cough CXR- ? PNA.  - Procalcitonin 0.38>0.44>0.69 - Treated w/ rocephin+ doxycycline x 6 days. Course completed 1/11.  - Covid neg.  - sputum cx NGTD  3. ETOH Abuse -Drinks alcohol daily. ~6 pack per day.   4. Cocaine  -UDS was + at [facility]  -He denies cocaine use.   5. Atrial tach - had several hour episode of atrial tach post cath. Resolved with IV amio. Amio stopped.  6. Hypokalemia/hypomag - K 3.7     Length of Stay: [place], NP  02/18/2019, 8:34 AM  Advanced Heart Failure Team Pager [phone] (M-F; [place])  Please contact [facility] for night-coverage after hours (4p -7a ) and weekends on [email or web address]  Patient seen and examined with the above-signed Advanced Practice Provider and/or Housestaff. I personally reviewed laboratory data, imaging studies and relevant notes. I independently examined the patient  and formulated the important aspects of the plan. I have edited the note to reflect any of my changes or salient points. I have personally discussed the plan with the patient and/or family.  Co-ox dropping on lower dose of milrinone. CVP up. Agree with switch to IV lasix. Repeat co-ox. BP too soft to titrate other HF meds. I am increasingly concerned that he is inotrope dependent and may need to restart VAD exam.   Glori Bickers, MD  1:37 PM

## 2019-02-18 NOTE — Progress Notes (Signed)
Chaplain engaged in follow-up visit with [name].  Chaplain and [name] engaged in prayer together.  [name] is able to verbalize the decisions regarding his health.  He still finds himself not knowing what to do just yet.  Chaplain affirmed his need for time to decide what to do, and chaplain uplifted in prayer the need for clarity concerning his decisions.   Chaplain will follow-up.

## 2019-02-19 ENCOUNTER — Inpatient Hospital Stay (HOSPITAL_COMMUNITY): Payer: Medicaid - Out of State

## 2019-02-19 LAB — COOXEMETRY PANEL
Carboxyhemoglobin: 1 % (ref 0.5–1.5)
Carboxyhemoglobin: 1 % (ref 0.5–1.5)
Carboxyhemoglobin: 1 % (ref 0.5–1.5)
Carboxyhemoglobin: 1.1 % (ref 0.5–1.5)
Carboxyhemoglobin: 0.9 % (ref 0.5–1.5)
Carboxyhemoglobin: 1.7 % — ABNORMAL HIGH (ref 0.5–1.5)
Methemoglobin: 0.6 % (ref 0.0–1.5)
Methemoglobin: 0.4 % (ref 0.0–1.5)
Methemoglobin: 0.8 % (ref 0.0–1.5)
Methemoglobin: 1 % (ref 0.0–1.5)
Methemoglobin: 0.5 % (ref 0.0–1.5)
Methemoglobin: 1 % (ref 0.0–1.5)
O2 Saturation: 48.4 %
O2 Saturation: 41.8 %
O2 Saturation: 31.6 %
O2 Saturation: 38.1 %
O2 Saturation: 35.8 %
O2 Saturation: 60.8 %
Total hemoglobin: 10.8 g/dL — ABNORMAL LOW (ref 12.0–16.0)
Total hemoglobin: 10 g/dL — ABNORMAL LOW (ref 12.0–16.0)
Total hemoglobin: 11.2 g/dL — ABNORMAL LOW (ref 12.0–16.0)
Total hemoglobin: 12 g/dL (ref 12.0–16.0)
Total hemoglobin: 11.2 g/dL — ABNORMAL LOW (ref 12.0–16.0)
Total hemoglobin: 11.9 g/dL — ABNORMAL LOW (ref 12.0–16.0)

## 2019-02-19 LAB — CBC
HCT: 32.8 % — ABNORMAL LOW (ref 39.0–52.0)
Hemoglobin: 10.7 g/dL — ABNORMAL LOW (ref 13.0–17.0)
MCH: 33 pg (ref 26.0–34.0)
MCHC: 32.6 g/dL (ref 30.0–36.0)
MCV: 101.2 fL — ABNORMAL HIGH (ref 80.0–100.0)
Platelets: 191 10*3/uL (ref 150–400)
RBC: 3.24 MIL/uL — ABNORMAL LOW (ref 4.22–5.81)
RDW: 15.6 % — ABNORMAL HIGH (ref 11.5–15.5)
WBC: 7.5 10*3/uL (ref 4.0–10.5)
nRBC: 0 % (ref 0.0–0.2)

## 2019-02-19 LAB — BASIC METABOLIC PANEL
Anion gap: 11 (ref 5–15)
BUN: 28 mg/dL — ABNORMAL HIGH (ref 6–20)
CO2: 27 mmol/L (ref 22–32)
Calcium: 9.6 mg/dL (ref 8.9–10.3)
Chloride: 95 mmol/L — ABNORMAL LOW (ref 98–111)
Creatinine, Ser: 0.82 mg/dL (ref 0.61–1.24)
GFR calc Af Amer: >60 mL/min (ref >60)
GFR calc non Af Amer: >60 mL/min (ref >60)
Glucose, Bld: 135 mg/dL — ABNORMAL HIGH (ref 70–99)
Potassium: 3.6 mmol/L (ref 3.5–5.1)
Sodium: 133 mmol/L — ABNORMAL LOW (ref 135–145)

## 2019-02-19 LAB — MAGNESIUM: Magnesium: 1.8 mg/dL (ref 1.7–2.4)

## 2019-02-19 LAB — PROCALCITONIN: Procalcitonin: <0.1 ng/mL

## 2019-02-19 MED ORDER — POTASSIUM CHLORIDE CRYS ER 20 MEQ PO TBCR
40.0000 meq | EXTENDED_RELEASE_TABLET | ORAL | Status: AC
  Administered 2019-02-19 (×2): 40 meq via ORAL
  Filled 2019-02-19 (×2): qty 2

## 2019-02-19 MED ORDER — MAGNESIUM SULFATE 4 GM/100ML IV SOLN
4.0000 g | Freq: Once | INTRAVENOUS | Status: AC
  Administered 2019-02-19: 4 g via INTRAVENOUS
  Filled 2019-02-19: qty 100

## 2019-02-19 MED ORDER — DOBUTAMINE IN D5W 4-5 MG/ML-% IV SOLN
5.0000 ug/kg/min | INTRAVENOUS | Status: DC
  Administered 2019-02-19 – 2019-02-24 (×3): 5 ug/kg/min via INTRAVENOUS
  Filled 2019-02-19 (×3): qty 250

## 2019-02-19 MED ORDER — GUAIFENESIN-DM 100-10 MG/5ML PO SYRP
15.0000 mL | ORAL_SOLUTION | ORAL | Status: DC | PRN
  Administered 2019-02-19 – 2019-02-20 (×5): 15 mL via ORAL
  Filled 2019-02-19 (×5): qty 15

## 2019-02-19 MED ORDER — GENERIC EXTERNAL MEDICATION
Status: DC
Start: ? — End: 2019-02-19

## 2019-02-19 MED ORDER — NOREPINEPHRINE 4 MG/250ML-% IV SOLN
5.0000 ug/min | INTRAVENOUS | Status: DC
  Administered 2019-02-19: 5 ug/min via INTRAVENOUS
  Filled 2019-02-19: qty 250

## 2019-02-19 MED ORDER — DM-GUAIFENESIN ER 30-600 MG PO TB12
2.0000 | ORAL_TABLET | Freq: Two times a day (BID) | ORAL | Status: DC
  Administered 2019-02-19 – 2019-02-25 (×14): 2 via ORAL
  Filled 2019-02-19: qty 2
  Filled 2019-02-19: qty 1
  Filled 2019-02-19 (×12): qty 2

## 2019-02-19 MED ORDER — FUROSEMIDE 10 MG/ML IJ SOLN
80.0000 mg | Freq: Two times a day (BID) | INTRAMUSCULAR | Status: DC
  Administered 2019-02-19 – 2019-02-20 (×4): 80 mg via INTRAVENOUS
  Filled 2019-02-19 (×5): qty 8

## 2019-02-19 MED ORDER — MILRINONE LACTATE IN DEXTROSE 20-5 MG/100ML-% IV SOLN
0.5000 ug/kg/min | INTRAVENOUS | Status: DC
  Administered 2019-02-19 – 2019-02-22 (×6): 0.375 ug/kg/min via INTRAVENOUS
  Administered 2019-02-24 – 2019-02-25 (×3): 0.5 ug/kg/min via INTRAVENOUS
  Filled 2019-02-19 (×10): qty 100

## 2019-02-19 NOTE — Progress Notes (Addendum)
Sent text page to Md Bensimhon with follow up co-ox result of 35.8.   New orders received.

## 2019-02-19 NOTE — Progress Notes (Signed)
Co-ox drawn at 1450 result: 31.6% RN drew another co-ox to confirm results at 1528 and result: 38.1. Paged MD Bensimhon to inform of lab result.  New orders received.

## 2019-02-19 NOTE — Progress Notes (Addendum)
Advanced Heart Failure Rounding Note  PCP-Cardiologist: No primary care provider on file.   Subjective:    34 y.o. 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  Underwent emergent cath on 1/7 for co-ox in the 20s. However cath with co-ox in 60-70 range so Impella placement deferred. Coronaries clean.   Milrinone added and co-ox much improved. DBA discontinued 1/11.   Remains on milrinone 0.25 mcg. Todays CO-OX is 48%   Yesterday diuresed with IV lasix 80 mg x 2. Weight down a couple of pounds.   Feels terrible this morning. Coughing all night.  Sats dropping to the 80s over night  On 2 liters.    Objective:   Weight Range: 50.3 kg Body mass index is 16.86 kg/m.   Vital Signs:   Temp:  [98 F (36.7 C)-99.1 F (37.3 C)] 98.1 F (36.7 C) (01/15 0737) Pulse Rate:  [37-124] 122 (01/15 0700) Resp:  [11-35] 11 (01/15 0700) BP: (80-107)/(62-85) 107/77 (01/15 0700) SpO2:  [86 %-100 %] 91 % (01/15 0700) Weight:  [50.3 kg] 50.3 kg (01/15 0600) Last BM Date: 02/18/19  Weight change: Filed Weights   02/17/19 0500 02/18/19 0500 02/19/19 0600  Weight: 49.9 kg 51.1 kg 50.3 kg    Intake/Output:   Intake/Output Summary (Last 24 hours) at 02/19/2019 0804 Last data filed at 02/19/2019 0700 Gross per 24 hour  Intake 1471.29 ml  Output 3175 ml  Net -1703.71 ml      Physical Exam  CVP 14  General:Appears weak HEENT: normal Neck: supple.JVP to jaw . Carotids 2+ bilat; no bruits. No lymphadenopathy or thryomegaly appreciated. Cor: PMI nondisplaced. Regular rate & rhythm. No rubs, or murmurs. +S3  Lungs: Rhochi on right on 2 liters [place].  Abdomen: soft, nontender, nondistended. No hepatosplenomegaly. No bruits or masses. Good bowel sounds. Extremities: no cyanosis, clubbing, rash, edema. RUE PICC  Neuro: alert & orientedx3, cranial nerves grossly intact. moves all 4 extremities w/o difficulty. Affect flat    Telemetry  Sinus tach 120-130s   Labs    CBC Recent Labs    02/18/19 0248 02/19/19 0309  WBC 5.9 7.5  HGB 10.6* 10.7*  HCT 33.3* 32.8*  MCV 102.8* 101.2*  PLT 175 191   Basic Metabolic Panel Recent Labs    96/28/36 0248 02/19/19 0309  NA 131* 133*  K 3.7 3.6  CL 95* 95*  CO2 27 27  GLUCOSE 121* 135*  BUN 24* 28*  CREATININE 0.77 0.82  CALCIUM 9.3 9.6  MG 1.9 1.8   Liver Function Tests No results for input(s): AST, ALT, ALKPHOS, BILITOT, PROT, ALBUMIN in the last 72 hours. No results for input(s): LIPASE, AMYLASE in the last 72 hours. Cardiac Enzymes No results for input(s): CKTOTAL, CKMB, CKMBINDEX, TROPONINI in the last 72 hours.  BNP: BNP (last 3 results) Recent Labs    02/10/19 0219  BNP 2,048.6*    ProBNP (last 3 results) No results for input(s): PROBNP in the last 8760 hours.   D-Dimer No results for input(s): DDIMER in the last 72 hours. Hemoglobin A1C No results for input(s): HGBA1C in the last 72 hours. Fasting Lipid Panel No results for input(s): CHOL, HDL, LDLCALC, TRIG, CHOLHDL, LDLDIRECT in the last 72 hours. Thyroid Function Tests No results for input(s): TSH, T4TOTAL, T3FREE, THYROIDAB in the last 72 hours.  Invalid input(s): FREET3  Other results:   Imaging    No results found.   Medications:     Scheduled Medications: .  Chlorhexidine Gluconate Cloth  6 each Topical Daily  . dextromethorphan-guaiFENesin  1 tablet Oral BID  . digoxin  0.125 mg Oral Daily  . enoxaparin (LOVENOX) injection  40 mg Subcutaneous Q24H  . feeding supplement (ENSURE ENLIVE)  237 mL Oral TID BM  . losartan  12.5 mg Oral QHS  . multivitamin with minerals  1 tablet Oral Daily  . sodium chloride flush  10-40 mL Intracatheter Q12H  . sodium chloride flush  10-40 mL Intracatheter Q12H  . sodium chloride flush  3 mL Intravenous Q12H  . sodium chloride flush  3 mL Intravenous Q12H  . sodium chloride flush  3 mL Intravenous Q12H  . spironolactone  25 mg Oral QHS    Infusions: . sodium  chloride Stopped (02/10/19 1600)  . sodium chloride Stopped (02/18/19 2028)  . milrinone 0.25 mcg/kg/min (02/19/19 0700)    PRN Medications: sodium chloride, sodium chloride, acetaminophen, ALPRAZolam, guaiFENesin-dextromethorphan, ondansetron (ZOFRAN) IV, sodium chloride flush, sodium chloride flush, sodium chloride flush, sodium chloride flush, zolpidem     Assessment/Plan   1. Acute/Chronic Systolic HF -> cardiogenic shock - ECHO at [facility] EF, 20%.  - ECHO repeated and showed severely reduced EF <20%. RV only mildly down - R/L cath 1/7 normal cors. Output ok   - CO-OX 48%.  Increase milrinone 0.375 mcg.  - CVP 13-14. Continue 80 mg IV twice daily. Renal function.  - Continue digoxin 0.125 mg daily.  - Continue spiro to 25 mg qhs - Continue losartan 12.5 qhs as BP tolerates. No room for further titration yet. - VAD w/u underway. As cardiomyopathy seems relatively new-onset over past few weeks, would like to give him a bit of time to recover but if we cannot stabilize we may have to move to mechanical support sooner. Suspect he will need home milrinone  2. Cough CXR- ? PNA on admit  - On admit Procalcitonin 0.38>0.44>0.69 - Treated w/ rocephin+ doxycycline x 6 days. Course completed 1/11.  - Sputum - 1/9 few candida - Check blood cultures and CXR now.  - Persistent cough- he does have volume overloaded. Continue to diuresis with IV lasix. WBC 7.4.   3. ETOH Abuse -Drinks alcohol daily. ~6 pack per day.   4. Cocaine  -UDS was + at [facility]  -He denies cocaine use.   5. Atrial tach - had several hour episode of atrial tach post cath. Resolved with IV amio. Amio stopped.  6. Hypokalemia/hypomag -K 3.6  Mag 1.8  Supp K  Give 4 gram mag now.     Length of Stay: 10  [name], NP  02/19/2019, 8:04 AM  Advanced Heart Failure Team Pager [phone] (M-F; 7a - 4p)  Please contact [facility] for night-coverage after hours (4p -7a ) and weekends on  [email or web address]  Agree with above.   He is much worse today. Co-ox back down in shock range at 48%. Increasing weakness and cough. CVP up to 14.   General:  Sitting in bed weak appearing. + cough HEENT: normal Neck: supple. + JVD to ear. Carotids 2+ bilat; no bruits. No lymphadenopathy or thryomegaly appreciated. Cor: PMI nondisplaced. Tachy regular + s3 Lungs: coarse decreased R base Abdomen: soft, nontender, nondistended. No hepatosplenomegaly. No bruits or masses. Good bowel sounds. Extremities: no cyanosis, clubbing, rash, 1+ edema cool  Neuro: alert & orientedx3, cranial nerves grossly intact. moves all 4 extremities w/o difficulty. Affect pleasant   He is back in cardiogenic shock despite milrinone at 0.25. Will increase to 0.375.  May need to add back NE. Increase lasix. Will need to re-engage VAD team   CRITICAL CARE Performed by: Glori Bickers  Total critical care time: 35 minutes  Critical care time was exclusive of separately billable procedures and treating other patients.  Critical care was necessary to treat or prevent imminent or life-threatening deterioration.  Critical care was time spent personally by me (independent of midlevel providers or residents) on the following activities: development of treatment plan with patient and/or surrogate as well as nursing, discussions with consultants, evaluation of patient's response to treatment, examination of patient, obtaining history from patient or surrogate, ordering and performing treatments and interventions, ordering and review of laboratory studies, ordering and review of radiographic studies, pulse oximetry and re-evaluation of patient's condition.  Glori Bickers, MD  8:51 AM

## 2019-02-19 NOTE — Progress Notes (Signed)
Re-check co-ox after increase in Milrinone is down to 41.8. Notified Bensimhon, MD. Will add Levophed and recheck in an hour.

## 2019-02-19 NOTE — Progress Notes (Signed)
   CO-OX 38% despite milrinone 0.375 mcg + Norepi 5 mcg.   Dr Gala Romney reviewed milrinone increased to 0.5 mcg.   No other change for now.   Ellean Firman NP-C  4:57 PM

## 2019-02-20 LAB — BASIC METABOLIC PANEL
Anion gap: 10 (ref 5–15)
BUN: 25 mg/dL — ABNORMAL HIGH (ref 6–20)
CO2: 28 mmol/L (ref 22–32)
Calcium: 8.9 mg/dL (ref 8.9–10.3)
Chloride: 94 mmol/L — ABNORMAL LOW (ref 98–111)
Creatinine, Ser: 0.93 mg/dL (ref 0.61–1.24)
GFR calc Af Amer: >60 mL/min (ref >60)
GFR calc non Af Amer: >60 mL/min (ref >60)
Glucose, Bld: 109 mg/dL — ABNORMAL HIGH (ref 70–99)
Potassium: 3.8 mmol/L (ref 3.5–5.1)
Sodium: 132 mmol/L — ABNORMAL LOW (ref 135–145)

## 2019-02-20 LAB — COOXEMETRY PANEL
Carboxyhemoglobin: 1.5 % (ref 0.5–1.5)
Carboxyhemoglobin: 1.5 % (ref 0.5–1.5)
Methemoglobin: 0.9 % (ref 0.0–1.5)
Methemoglobin: 0.5 % (ref 0.0–1.5)
O2 Saturation: 50 %
O2 Saturation: 58.4 %
Total hemoglobin: 13.6 g/dL (ref 12.0–16.0)
Total hemoglobin: 10.3 g/dL — ABNORMAL LOW (ref 12.0–16.0)

## 2019-02-20 LAB — PROCALCITONIN: Procalcitonin: 0.11 ng/mL

## 2019-02-20 MED ORDER — POTASSIUM CHLORIDE CRYS ER 20 MEQ PO TBCR
40.0000 meq | EXTENDED_RELEASE_TABLET | Freq: Two times a day (BID) | ORAL | Status: DC
  Administered 2019-02-20 (×2): 40 meq via ORAL
  Filled 2019-02-20 (×2): qty 2

## 2019-02-20 NOTE — Progress Notes (Signed)
Patient ID: Jon Martin, male   DOB: Feb 22, 1985, 34 y.o.   MRN: [id number]     Advanced Heart Failure Rounding Note  PCP-Cardiologist: No primary care provider on file.   Subjective:    34 y.o. 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  - Underwent emergent cath on 1/7 for co-ox in the 20s. However cath with co-ox in 60-70 range so Impella placement deferred. Coronaries clean.  - Milrinone added and co-ox much improved. DBA discontinued 1/11.  - Fall in co-ox 1/15, milrinone and dobutamine titrated, now on milrinone 0.375 and dobutamine 5.   Diuresis with IV Lasix yesterday, weight down 2 lbs.  CVP 13-14 this morning.  Co-ox low early am, 50%.  SBP 90s-100s.  Sinus tachy 110s.   Feels ok at rest, denies dyspnea.    Objective:   Weight Range: 50.3 kg Body mass index is 16.86 kg/m.   Vital Signs:   Temp:  [97.7 F (36.5 C)-98.9 F (37.2 C)] 98.7 F (37.1 C) (01/16 0815) Pulse Rate:  [108-123] 112 (01/16 0400) Resp:  [11-36] 18 (01/16 0400) BP: (86-126)/(55-102) 94/67 (01/16 0400) SpO2:  [88 %-97 %] 91 % (01/16 0400) Last BM Date: 02/19/19  Weight change: Filed Weights   02/17/19 0500 02/18/19 0500 02/19/19 0600  Weight: 49.9 kg 51.1 kg 50.3 kg    Intake/Output:   Intake/Output Summary (Last 24 hours) at 02/20/2019 0843 Last data filed at 02/20/2019 0817 Gross per 24 hour  Intake 744 ml  Output 2675 ml  Net -1931 ml      Physical Exam  CVP 13-14 General: NAD Neck: JVP 14-16 cm, no thyromegaly or thyroid nodule.  Lungs: Clear to auscultation bilaterally with normal respiratory effort. CV: Lateral PMI.  Heart tachy, regular S1/S2, +S3, 2/6 HSM apex.  No peripheral edema.   Abdomen: Soft, nontender, no hepatosplenomegaly, no distention.  Skin: Intact without lesions or rashes.  Neurologic: Alert and oriented x 3.  Psych: Normal affect. Extremities: No clubbing or cyanosis.  HEENT: Normal.    Telemetry  Sinus tachy 110s  (personally reviewed)  Labs    CBC Recent Labs    02/18/19 0248 02/19/19 0309  WBC 5.9 7.5  HGB 10.6* 10.7*  HCT 33.3* 32.8*  MCV 102.8* 101.2*  PLT 175 191   Basic Metabolic Panel Recent Labs    31/54/00 0248 02/18/19 0248 02/19/19 0309 02/20/19 0412  NA 131*   < > 133* 132*  K 3.7   < > 3.6 3.8  CL 95*   < > 95* 94*  CO2 27   < > 27 28  GLUCOSE 121*   < > 135* 109*  BUN 24*   < > 28* 25*  CREATININE 0.77   < > 0.82 0.93  CALCIUM 9.3   < > 9.6 8.9  MG 1.9  --  1.8  --    < > = values in this interval not displayed.   Liver Function Tests No results for input(s): AST, ALT, ALKPHOS, BILITOT, PROT, ALBUMIN in the last 72 hours. No results for input(s): LIPASE, AMYLASE in the last 72 hours. Cardiac Enzymes No results for input(s): CKTOTAL, CKMB, CKMBINDEX, TROPONINI in the last 72 hours.  BNP: BNP (last 3 results) Recent Labs    02/10/19 0219  BNP 2,048.6*    ProBNP (last 3 results) No results for input(s): PROBNP in the last 8760 hours.   D-Dimer No results for input(s): DDIMER in the last 72 hours. Hemoglobin A1C  No results for input(s): HGBA1C in the last 72 hours. Fasting Lipid Panel No results for input(s): CHOL, HDL, LDLCALC, TRIG, CHOLHDL, LDLDIRECT in the last 72 hours. Thyroid Function Tests No results for input(s): TSH, T4TOTAL, T3FREE, THYROIDAB in the last 72 hours.  Invalid input(s): FREET3  Other results:   Imaging    DG CHEST PORT 1 VIEW  Result Date: 02/19/2019 CLINICAL DATA:  Coughing. Decreased oxygen saturation. History of a dilated cardiomyopathy. EXAM: PORTABLE CHEST 1 VIEW COMPARISON:  02/16/2019 FINDINGS: Cardiac silhouette is mildly enlarged. No mediastinal or hilar masses. There are perihilar airspace opacities extending superiorly from both hila increased when compared to the prior chest radiograph. Mild bilateral interstitial thickening is similar to the prior exam. No convincing pleural effusion.  No pneumothorax. Right  PICC has its tip in the lower superior vena cava, stable. Skeletal structures are grossly intact. IMPRESSION: 1. Bilateral superior perihilar airspace opacities associated with interstitial thickening and cardiac enlargement. Findings support congestive heart failure. Consider pneumonia if there are consistent clinical findings. Perihilar opacities have increased compared to the prior study. Electronically Signed   By: Amie Portland M.D.   On: 02/19/2019 08:57     Medications:     Scheduled Medications: . Chlorhexidine Gluconate Cloth  6 each Topical Daily  . dextromethorphan-guaiFENesin  2 tablet Oral BID  . digoxin  0.125 mg Oral Daily  . enoxaparin (LOVENOX) injection  40 mg Subcutaneous Q24H  . feeding supplement (ENSURE ENLIVE)  237 mL Oral TID BM  . furosemide  80 mg Intravenous BID  . multivitamin with minerals  1 tablet Oral Daily  . sodium chloride flush  10-40 mL Intracatheter Q12H  . sodium chloride flush  10-40 mL Intracatheter Q12H  . sodium chloride flush  3 mL Intravenous Q12H  . sodium chloride flush  3 mL Intravenous Q12H  . sodium chloride flush  3 mL Intravenous Q12H    Infusions: . sodium chloride Stopped (02/10/19 1600)  . sodium chloride Stopped (02/19/19 1523)  . DOBUTamine 5 mcg/kg/min (02/20/19 0500)  . milrinone 0.375 mcg/kg/min (02/20/19 0500)    PRN Medications: sodium chloride, sodium chloride, acetaminophen, ALPRAZolam, guaiFENesin-dextromethorphan, ondansetron (ZOFRAN) IV, sodium chloride flush, sodium chloride flush, sodium chloride flush, sodium chloride flush, zolpidem     Assessment/Plan   1. Acute/Chronic Systolic HF -> cardiogenic shock - ECHO at [facility] EF, 20%.  - ECHO repeated and showed severely reduced EF <20%. RV only mildly down - R/L cath 1/7 normal cors. Output ok   - CO-OX early am 50%, now on milrinone 0.375 and dobutamine 5.  Will resend co-ox this morning now that he is awake.  - He is quite tenuous.  May need to revisit  support with Impella 5.5 as bridge to LVAD.  Will see what co-ox looks like on repeat today.  Remains tachycardic.  - CVP 13-14 today. Continue 80 mg IV twice daily, good UOP yesterday.  Creatinine stable.   - Continue digoxin 0.125 mg daily.  - VAD w/u underway. As cardiomyopathy seems relatively new-onset over past few weeks, would like to give him a bit of time to recover but if we cannot stabilize we may have to move to mechanical support sooner. Currently more tenuous and may need temporary support with Impella at some point this weekend, will follow.   2. ID CXR- ? PNA on admit  - Procalcitonin 0.38>0.44>0.69>0.11 - WBCs normal.   - Treated w/ rocephin+ doxycycline x 6 days. Course completed 1/11.  - Sputum - 1/9  few candida - Blood cultures NGTD - Cough better today.    3. ETOH Abuse -Drinks alcohol daily. ~6 pack per day.   4. Cocaine  -UDS was + at [facility]  -He denies cocaine use.   5. Atrial tach - had several hour episode of atrial tach post cath. Resolved with IV amio. Amio stopped.  6. Hypokalemia/hypomag - supplement K.   CRITICAL CARE Performed by: Loralie Champagne  Total critical care time: 35 minutes  Critical care time was exclusive of separately billable procedures and treating other patients.  Critical care was necessary to treat or prevent imminent or life-threatening deterioration.  Critical care was time spent personally by me on the following activities: development of treatment plan with patient and/or surrogate as well as nursing, discussions with consultants, evaluation of patient's response to treatment, examination of patient, obtaining history from patient or surrogate, ordering and performing treatments and interventions, ordering and review of laboratory studies, ordering and review of radiographic studies, pulse oximetry and re-evaluation of patient's condition.  Length of Stay: 33  Loralie Champagne, MD  02/20/2019, 8:43 AM  Advanced Heart  Failure Team Pager [phone] (M-F; 7a - 4p)  Please contact [facility] for night-coverage after hours (4p -7a ) and weekends on [email or web address]

## 2019-02-20 NOTE — Plan of Care (Signed)
  Problem: Education: Goal: Knowledge of General Education information will improve Description: Including pain rating scale, medication(s)/side effects and non-pharmacologic comfort measures Outcome: Progressing   Problem: Clinical Measurements: Goal: Ability to maintain clinical measurements within normal limits will improve Outcome: Progressing   Problem: Activity: Goal: Risk for activity intolerance will decrease Outcome: Progressing   Problem: Coping: Goal: Level of anxiety will decrease Outcome: Progressing   Problem: Pain Managment: Goal: General experience of comfort will improve Outcome: Progressing   

## 2019-02-20 NOTE — Progress Notes (Signed)
CT Surgery  Patient discussed with Dr Shirlee Latch for coordination of care. Patients hemodynamic status has shown deterioration and he may need an Impella placed soon. Will place NPO and follow the response to inotrope titration  P Donata Clay MD

## 2019-02-21 LAB — BASIC METABOLIC PANEL
Anion gap: 12 (ref 5–15)
BUN: 23 mg/dL — ABNORMAL HIGH (ref 6–20)
CO2: 29 mmol/L (ref 22–32)
Calcium: 9.6 mg/dL (ref 8.9–10.3)
Chloride: 94 mmol/L — ABNORMAL LOW (ref 98–111)
Creatinine, Ser: 0.84 mg/dL (ref 0.61–1.24)
GFR calc Af Amer: >60 mL/min (ref >60)
GFR calc non Af Amer: >60 mL/min (ref >60)
Glucose, Bld: 131 mg/dL — ABNORMAL HIGH (ref 70–99)
Potassium: 3.8 mmol/L (ref 3.5–5.1)
Sodium: 135 mmol/L (ref 135–145)

## 2019-02-21 LAB — COOXEMETRY PANEL
Carboxyhemoglobin: 1.7 % — ABNORMAL HIGH (ref 0.5–1.5)
Methemoglobin: 1 % (ref 0.0–1.5)
O2 Saturation: 60.3 %
Total hemoglobin: 10.7 g/dL — ABNORMAL LOW (ref 12.0–16.0)

## 2019-02-21 LAB — PROCALCITONIN: Procalcitonin: <0.1 ng/mL

## 2019-02-21 MED ORDER — POTASSIUM CHLORIDE CRYS ER 20 MEQ PO TBCR
40.0000 meq | EXTENDED_RELEASE_TABLET | Freq: Once | ORAL | Status: AC
  Administered 2019-02-21: 40 meq via ORAL
  Filled 2019-02-21: qty 2

## 2019-02-21 MED ORDER — TORSEMIDE 20 MG PO TABS
40.0000 mg | ORAL_TABLET | Freq: Every day | ORAL | Status: DC
  Administered 2019-02-21 – 2019-02-22 (×2): 40 mg via ORAL
  Filled 2019-02-21 (×2): qty 2

## 2019-02-21 NOTE — Plan of Care (Signed)
?  Problem: Clinical Measurements: ?Goal: Ability to maintain clinical measurements within normal limits will improve ?Outcome: Progressing ?  ?Problem: Activity: ?Goal: Risk for activity intolerance will decrease ?Outcome: Progressing ?  ?Problem: Nutrition: ?Goal: Adequate nutrition will be maintained ?Outcome: Progressing ?  ?Problem: Pain Managment: ?Goal: General experience of comfort will improve ?Outcome: Progressing ?  ?

## 2019-02-21 NOTE — Progress Notes (Signed)
Patient ID: Jon Martin, male   DOB: 03/19/1985, 34 y.o.   MRN: [id number]     Advanced Heart Failure Rounding Note  PCP-Cardiologist: No primary care provider on file.   Subjective:    34 y.o. 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  - Underwent emergent cath on 1/7 for co-ox in the 20s. However cath with co-ox in 60-70 range so Impella placement deferred. Coronaries clean.  - Milrinone added and co-ox much improved. DBA discontinued 1/11.  - Fall in co-ox 1/15, milrinone and dobutamine titrated, now on milrinone 0.375 and dobutamine 5.   Diuresis with IV Lasix yesterday, weight down 6 lbs.  CVP 7 this morning.  Co-ox better at 60% today.  Still with sinus tachy 110s, SBP around 90.   Feels ok at rest, denies dyspnea.    Objective:   Weight Range: 47.2 kg Body mass index is 15.82 kg/m.   Vital Signs:   Temp:  [98.1 F (36.7 C)-99.1 F (37.3 C)] 98.8 F (37.1 C) (01/17 0813) Pulse Rate:  [41-124] 121 (01/17 0600) Resp:  [10-37] 15 (01/17 0600) BP: (77-124)/(56-103) 89/58 (01/17 0600) SpO2:  [67 %-100 %] 97 % (01/17 0600) Weight:  [47.2 kg] 47.2 kg (01/17 0600) Last BM Date: 02/19/19  Weight change: Filed Weights   02/18/19 0500 02/19/19 0600 02/21/19 0600  Weight: 51.1 kg 50.3 kg 47.2 kg    Intake/Output:   Intake/Output Summary (Last 24 hours) at 02/21/2019 0823 Last data filed at 02/21/2019 0600 Gross per 24 hour  Intake 595.51 ml  Output 4425 ml  Net -3829.49 ml      Physical Exam  CVP 7 General: NAD Neck: No JVD, no thyromegaly or thyroid nodule.  Lungs: Clear to auscultation bilaterally with normal respiratory effort. CV: Lateral PMI.  Heart regular S1/S2, +S3, 2/6 HSM LLSB/apex.  No peripheral edema.   Abdomen: Soft, nontender, no hepatosplenomegaly, no distention.  Skin: Intact without lesions or rashes.  Neurologic: Alert and oriented x 3.  Psych: Normal affect. Extremities: No clubbing or cyanosis.  HEENT: Normal.     Telemetry  Sinus tachy 110s (personally reviewed)  Labs    CBC Recent Labs    02/19/19 0309  WBC 7.5  HGB 10.7*  HCT 32.8*  MCV 101.2*  PLT 191   Basic Metabolic Panel Recent Labs    78/29/56 0309 02/19/19 0309 02/20/19 0412 02/21/19 0328  NA 133*   < > 132* 135  K 3.6   < > 3.8 3.8  CL 95*   < > 94* 94*  CO2 27   < > 28 29  GLUCOSE 135*   < > 109* 131*  BUN 28*   < > 25* 23*  CREATININE 0.82   < > 0.93 0.84  CALCIUM 9.6   < > 8.9 9.6  MG 1.8  --   --   --    < > = values in this interval not displayed.   Liver Function Tests No results for input(s): AST, ALT, ALKPHOS, BILITOT, PROT, ALBUMIN in the last 72 hours. No results for input(s): LIPASE, AMYLASE in the last 72 hours. Cardiac Enzymes No results for input(s): CKTOTAL, CKMB, CKMBINDEX, TROPONINI in the last 72 hours.  BNP: BNP (last 3 results) Recent Labs    02/10/19 0219  BNP 2,048.6*    ProBNP (last 3 results) No results for input(s): PROBNP in the last 8760 hours.   D-Dimer No results for input(s): DDIMER in the last 72  hours. Hemoglobin A1C No results for input(s): HGBA1C in the last 72 hours. Fasting Lipid Panel No results for input(s): CHOL, HDL, LDLCALC, TRIG, CHOLHDL, LDLDIRECT in the last 72 hours. Thyroid Function Tests No results for input(s): TSH, T4TOTAL, T3FREE, THYROIDAB in the last 72 hours.  Invalid input(s): FREET3  Other results:   Imaging    No results found.   Medications:     Scheduled Medications: . Chlorhexidine Gluconate Cloth  6 each Topical Daily  . dextromethorphan-guaiFENesin  2 tablet Oral BID  . digoxin  0.125 mg Oral Daily  . enoxaparin (LOVENOX) injection  40 mg Subcutaneous Q24H  . feeding supplement (ENSURE ENLIVE)  237 mL Oral TID BM  . multivitamin with minerals  1 tablet Oral Daily  . potassium chloride  40 mEq Oral Once  . sodium chloride flush  10-40 mL Intracatheter Q12H  . sodium chloride flush  10-40 mL Intracatheter Q12H  . sodium  chloride flush  3 mL Intravenous Q12H  . sodium chloride flush  3 mL Intravenous Q12H  . sodium chloride flush  3 mL Intravenous Q12H  . torsemide  40 mg Oral Daily    Infusions: . sodium chloride Stopped (02/10/19 1600)  . sodium chloride Stopped (02/19/19 1523)  . DOBUTamine 5 mcg/kg/min (02/21/19 0600)  . milrinone 0.375 mcg/kg/min (02/21/19 0600)    PRN Medications: sodium chloride, sodium chloride, acetaminophen, ALPRAZolam, guaiFENesin-dextromethorphan, ondansetron (ZOFRAN) IV, sodium chloride flush, sodium chloride flush, sodium chloride flush, sodium chloride flush, zolpidem     Assessment/Plan   1. Acute/Chronic Systolic HF -> cardiogenic shock - ECHO at [facility] EF, 20%.  - ECHO repeated and showed severely reduced EF <20%. RV only mildly down - R/L cath 1/7 normal cors. Output ok   - CO-OX better now at 60%, now on milrinone 0.375 and dobutamine 5.  Will continue current support for now.  Remains tachycardic.  - CVP improved at 7.  Transition to torsemide 40 mg po daily today, stop IV Lasix.   - Continue digoxin 0.125 mg daily.  - VAD w/u underway. As cardiomyopathy seems relatively new-onset over past few weeks, would like to give him a bit of time to recover but if we cannot stabilize we may have to move to mechanical support sooner. Currently more tenuous and may need temporary support with Impella if deemed LVAD candidate.   2. ID CXR- ? PNA on admit  - Procalcitonin 0.38>0.44>0.69>0.11 - WBCs normal.   - Treated w/ rocephin+ doxycycline x 6 days. Course completed 1/11.  - Sputum - 1/9 few candida - Blood cultures NGTD - Cough better today.    3. ETOH Abuse -Drinks alcohol daily. ~6 pack per day.   4. Cocaine  -UDS was + at [facility]  -He denies cocaine use.   5. Atrial tach - had several hour episode of atrial tach post cath. Resolved with IV amio. Amio stopped.  6. Hypokalemia/hypomag - supplement K.   CRITICAL CARE Performed by: Loralie Champagne  Total critical care time: 35 minutes  Critical care time was exclusive of separately billable procedures and treating other patients.  Critical care was necessary to treat or prevent imminent or life-threatening deterioration.  Critical care was time spent personally by me on the following activities: development of treatment plan with patient and/or surrogate as well as nursing, discussions with consultants, evaluation of patient's response to treatment, examination of patient, obtaining history from patient or surrogate, ordering and performing treatments and interventions, ordering and review of laboratory studies,  ordering and review of radiographic studies, pulse oximetry and re-evaluation of patient's condition.  Length of Stay: 71  Marca Ancona, MD  02/21/2019, 8:23 AM  Advanced Heart Failure Team Pager [phone] (M-F; 7a - 4p)  Please contact [facility] for night-coverage after hours (4p -7a ) and weekends on [email or web address]

## 2019-02-22 LAB — HEPATIC FUNCTION PANEL
ALT: 24 U/L (ref 0–44)
AST: 27 U/L (ref 15–41)
Albumin: 3.1 g/dL — ABNORMAL LOW (ref 3.5–5.0)
Alkaline Phosphatase: 58 U/L (ref 38–126)
Bilirubin, Direct: 0.3 mg/dL — ABNORMAL HIGH (ref 0.0–0.2)
Indirect Bilirubin: 0.6 mg/dL (ref 0.3–0.9)
Total Bilirubin: 0.9 mg/dL (ref 0.3–1.2)
Total Protein: 7.3 g/dL (ref 6.5–8.1)

## 2019-02-22 LAB — BASIC METABOLIC PANEL
Anion gap: 11 (ref 5–15)
BUN: 22 mg/dL — ABNORMAL HIGH (ref 6–20)
CO2: 31 mmol/L (ref 22–32)
Calcium: 9.6 mg/dL (ref 8.9–10.3)
Chloride: 93 mmol/L — ABNORMAL LOW (ref 98–111)
Creatinine, Ser: 0.87 mg/dL (ref 0.61–1.24)
GFR calc Af Amer: >60 mL/min (ref >60)
GFR calc non Af Amer: >60 mL/min (ref >60)
Glucose, Bld: 164 mg/dL — ABNORMAL HIGH (ref 70–99)
Potassium: 3.6 mmol/L (ref 3.5–5.1)
Sodium: 135 mmol/L (ref 135–145)

## 2019-02-22 LAB — COOXEMETRY PANEL
Carboxyhemoglobin: 1.7 % — ABNORMAL HIGH (ref 0.5–1.5)
Methemoglobin: 0.9 % (ref 0.0–1.5)
O2 Saturation: 64.2 %
Total hemoglobin: 10.9 g/dL — ABNORMAL LOW (ref 12.0–16.0)

## 2019-02-22 LAB — PREALBUMIN: Prealbumin: 23.5 mg/dL (ref 18–38)

## 2019-02-22 MED ORDER — POTASSIUM CHLORIDE CRYS ER 20 MEQ PO TBCR
30.0000 meq | EXTENDED_RELEASE_TABLET | Freq: Two times a day (BID) | ORAL | Status: AC
  Administered 2019-02-22 (×2): 30 meq via ORAL
  Filled 2019-02-22 (×2): qty 1

## 2019-02-22 NOTE — Progress Notes (Addendum)
Patient ID: Jon Martin, male   DOB: [birth year], 34 y.o.   MRN: [id number]     Advanced Heart Failure Rounding Note  PCP-Cardiologist: No primary care provider on file.   Subjective:    34 y.o. 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  - Underwent emergent cath on 1/7 for co-ox in the 20s. However cath with co-ox in 60-70 range so Impella placement deferred. Coronaries clean.  - Milrinone added and co-ox much improved. DBA discontinued 1/11.  - Fall in co-ox 1/15, milrinone and dobutamine titrated, now on milrinone 0.375 and dobutamine 5. CO-OX stable at 64%.   Yesterday IV lasix stopped and switched to torsemide. Weight down another 2 pounds.   Denies SOB.     Objective:   Weight Range: 46.6 kg Body mass index is 15.62 kg/m.   Vital Signs:   Temp:  [98 F (36.7 C)-98.9 F (37.2 C)] 98.6 F (37 C) (01/18 0800) Pulse Rate:  [107-127] 125 (01/18 0700) Resp:  [10-35] 17 (01/18 0700) BP: (83-116)/(58-94) 102/79 (01/18 0700) SpO2:  [88 %-100 %] 96 % (01/18 0700) Weight:  [46.6 kg] 46.6 kg (01/18 0500) Last BM Date: 02/21/19  Weight change: Filed Weights   02/19/19 0600 02/21/19 0600 02/22/19 0500  Weight: 50.3 kg 47.2 kg 46.6 kg    Intake/Output:   Intake/Output Summary (Last 24 hours) at 02/22/2019 0803 Last data filed at 02/22/2019 0700 Gross per 24 hour  Intake 955.41 ml  Output 3025 ml  Net -2069.59 ml      Physical Exam  CVP 6-7  General:   No resp difficulty HEENT: normal Neck: supple. JVP 6-7. Carotids 2+ bilat; no bruits. No lymphadenopathy or thryomegaly appreciated. Cor: PMI nondisplaced. Tachy Regular rate & rhythm. No rubs,  or murmurs. + S3  Lungs: clear Abdomen: soft, nontender, nondistended. No hepatosplenomegaly. No bruits or masses. Good bowel sounds. Extremities: no cyanosis, clubbing, rash, edema Neuro: alert & orientedx3, cranial nerves grossly intact. moves all 4 extremities w/o difficulty. Affect  pleasant   Telemetry  Sinus Tach 110-120s one episode NSVT  Labs    CBC No results for input(s): WBC, NEUTROABS, HGB, HCT, MCV, PLT in the last 72 hours. Basic Metabolic Panel Recent Labs    35/57/32 0328 02/22/19 0408  NA 135 135  K 3.8 3.6  CL 94* 93*  CO2 29 31  GLUCOSE 131* 164*  BUN 23* 22*  CREATININE 0.84 0.87  CALCIUM 9.6 9.6   Liver Function Tests No results for input(s): AST, ALT, ALKPHOS, BILITOT, PROT, ALBUMIN in the last 72 hours. No results for input(s): LIPASE, AMYLASE in the last 72 hours. Cardiac Enzymes No results for input(s): CKTOTAL, CKMB, CKMBINDEX, TROPONINI in the last 72 hours.  BNP: BNP (last 3 results) Recent Labs    02/10/19 0219  BNP 2,048.6*    ProBNP (last 3 results) No results for input(s): PROBNP in the last 8760 hours.   D-Dimer No results for input(s): DDIMER in the last 72 hours. Hemoglobin A1C No results for input(s): HGBA1C in the last 72 hours. Fasting Lipid Panel No results for input(s): CHOL, HDL, LDLCALC, TRIG, CHOLHDL, LDLDIRECT in the last 72 hours. Thyroid Function Tests No results for input(s): TSH, T4TOTAL, T3FREE, THYROIDAB in the last 72 hours.  Invalid input(s): FREET3  Other results:   Imaging    No results found.   Medications:     Scheduled Medications: . Chlorhexidine Gluconate Cloth  6 each Topical Daily  . dextromethorphan-guaiFENesin  2 tablet Oral BID  . digoxin  0.125 mg Oral Daily  . enoxaparin (LOVENOX) injection  40 mg Subcutaneous Q24H  . feeding supplement (ENSURE ENLIVE)  237 mL Oral TID BM  . multivitamin with minerals  1 tablet Oral Daily  . sodium chloride flush  10-40 mL Intracatheter Q12H  . torsemide  40 mg Oral Daily    Infusions: . DOBUTamine 5 mcg/kg/min (02/22/19 0700)  . milrinone 0.375 mcg/kg/min (02/22/19 0700)    PRN Medications: acetaminophen, ALPRAZolam, guaiFENesin-dextromethorphan, ondansetron (ZOFRAN) IV, sodium chloride flush,  zolpidem     Assessment/Plan   1. Acute/Chronic Systolic HF -> cardiogenic shock - ECHO at [facility] EF, 20%.  - ECHO repeated and showed severely reduced EF <20%. RV only mildly down - R/L cath 1/7 normal cors. Output ok   - CO-OX stable 64% on milrinone 0.375 and dobutamine 5. -SBP remains soft. No BB/Arb for now. - Volume status stable on torsemide 40 mg daily.   - Continue digoxin 0.125 mg daily.  - Renal function stable.  - VAD w/u underway. As cardiomyopathy seems relatively new-onset over past few weeks, would like to give him a bit of time to recover but if we cannot stabilize we may have to move to mechanical support sooner. Currently more tenuous and may need temporary support with Impella if deemed LVAD candidate.   2. ID CXR- ? PNA on admit  - Procalcitonin 0.38>0.44>0.69>0.11> 0.10  - Treated w/ rocephin+ doxycycline x 6 days. Course completed 1/11.  - Sputum - 1/9 few candida - Blood cultures NGTD - CBC   3. ETOH Abuse -Drinks alcohol daily. ~6 pack per day.   4. Cocaine  -UDS was + at [facility]  -He denies cocaine use.   5. Atrial tach - had several hour episode of atrial tach post cath. Resolved with IV amio. Amio stopped.  6. Hypokalemia/hypomag - Supp K  ? Advanced therapies. Dr Gala Romney will contact his Mom later today.  Length of Stay: 13  Amy Filbert Schilder, NP  02/22/2019, 8:03 AM  Advanced Heart Failure Team Pager [phone] (M-F; 7a - 4p)  Please contact [facility] for night-coverage after hours (4p -7a ) and weekends on [email or web address]  Agree with above.   He has failed inotrope wean several times and had profound low output during most recent wean. Now output back up on dobutamine and milrinone. Volume status improved  General:  Weak appearing. No resp difficulty HEENT: normal Neck: supple. JVP 6-7Carotids 2+ bilat; no bruits. No lymphadenopathy or thryomegaly appreciated. Cor: PMI nondisplaced. Regular tachy +s3 Lungs: clear Abdomen: soft,  nontender, nondistended. No hepatosplenomegaly. No bruits or masses. Good bowel sounds. Extremities: no cyanosis, clubbing, rash, edema Neuro: alert & orientedx3, cranial nerves grossly intact. moves all 4 extremities w/o difficulty. Affect pleasant  He is critically ill and has failed inotrope wean several times. I think VAD is his only option to survive. Will re-engage VAD team today and discuss formally at [facility]. Will need to get his family (particualrly his mother) engaged to help move things forward. I dicussed at length with him and he is willing to proceed.   CRITICAL CARE Performed by: Arvilla Meres  Total critical care time: 40 minutes  Critical care time was exclusive of separately billable procedures and treating other patients.  Critical care was necessary to treat or prevent imminent or life-threatening deterioration.  Critical care was time spent personally by me (independent of midlevel providers or residents) on the following activities: development of treatment plan  with patient and/or surrogate as well as nursing, discussions with consultants, evaluation of patient's response to treatment, examination of patient, obtaining history from patient or surrogate, ordering and performing treatments and interventions, ordering and review of laboratory studies, ordering and review of radiographic studies, pulse oximetry and re-evaluation of patient's condition.   Glori Bickers, MD  8:43 AM

## 2019-02-22 NOTE — Progress Notes (Signed)
PHQ2 completed at bedside with patient. He scored a 0 indicating no signs of depression. CSW will continue to follow for LVAD implant and continually assess depression symptoms. Lasandra Beech, LCSW, CCSW-MCS [phone]

## 2019-02-22 NOTE — Progress Notes (Signed)
LVAD Initial Psychosocial Screening  Date/Time Initiated:  02-22-2019 11am Referral Source:  [name], VAD Coordinator Referral Reason: LVAD Implantation  Source of Information:  Patient, mother and chart review  Demographics Name:  Hever Castilleja Address:  [address] [place] [address] Home phone: [phone]    Cell: [phone] Marital Status:  Single  Faith:  [employer] Primary Language:  English DOB:  Apr 02, 1985  Medical & Follow-up Adherence to Medical regimen/INR checks: Other:Pt reports he has never been to an MD until hospitalized   Medication adherence: Other:Never had any medications. Pt reports he has never been to an MD until hospitalized   Physician/Clinic Appointment Attendance: Other:Pt reports he has never been to an MD until hospitalized     Advance Directives: Do you have a Living Will or Medical POA? No  Would you like to complete a Living Will and Medical POA prior to surgery?  No Do you have Goals of Care? Yes wants to be a full code  Have you had a consult with the Palliative Care Team at [facility]? Yes  Psychological Health Appearance:  In hospital gown Mental Status:  Alert, oriented Eye Contact:  Good Thought Content:  Coherent Speech:  Other (Comment)Talks fast but coherent Mood:  Appropriate and Pleasant  Affect:  Appropriate to circumstance Insight:  Fair Judgement: Unimpaired Interaction Style:  Engaged  Family/Social Information Who lives in your home? Name:   Relationship:    [name]   12yo mother  (works full time) Hessie Knows   62yo brother (disbaled brain injury) Charlann Noss  47yo brother (works full time) Ivin Booty   27yo [employer]  Other family members/support persons in your life? Name:   Relationship:    Merleen Nicely (lives local and drives) Lenore/ "nurse" Sister (lives local and drives)  Caregiving Needs Who is the primary caregiver? [name] mother Health status: good  Do you drive?  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  none Contact number: [phone]  Who is the secondary caregiver? John Henry/Lenore Health status:  Good/good Do you drive?   No/yes Do you work?  Yes/yes Physical Limitations:  none Do you have other care giving responsibilities? none   Home Environment/Personal Care Do you have reliable phone service? Yes  If so, what is the number?  verizon Do you own or rent your home? Mom owns home Number of steps into the home? 3 steps How many levels in the home? 1 level Assistive devices in the home? none Electrical needs for LVAD (3 prong outlets)? yes Second hand smoke exposure in the home? Yes- brother and nephew Travel distance from [facility]? 1.5 hrs   Community Are you active with community agencies/resources/homecare? No Agency Name:  n/a Are you active in a church, synagogue, mosque or other faith based community? Yes Faith based institutions name:  [facility] What other sources do you have for spiritual support? none Are you active in any clubs or social organizations? none What do you do for fun?  Hobbies?  Interests?  "Chill with my family and friends"  Education/Work Information What is the last grade of school you completed? GED Preferred method of learning?  Written, Verbal and Hands on Do you have any problems with reading or writing?  No Are you currently employed?  No  When were you last employed? 2019  Name of employer? 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 1year If you are not working, do you plan to return to work after VAD surgery? No Are you interested in job training or learning new skills?  Pending not sure Did you serve in the military? No  If so, what branch? Other  Financial Information What is your source of income? Varies- Odd jobs to make money Do you have difficulty  meeting your monthly expenses? No Family assist with basic needs Can you budget for the monthly cost for dressing supplies post procedure? Yes    Primary Health insurance:  [place] Medicaid Secondary Insurance: Prescription plan: Caid What are your prescription co-pays? Not sure Do you use mail order for your prescriptions?  No Have you ever had to refuse medication due to cost?  No never had any prescriptions before. Have you applied for Medicaid?  yes Have you applied for Social Security Disability (SSI)   Pending  Medical Information Briefly describe why you are here for evaluation: "I couldn't breathe and went to the hospital" Do you have a PCP or other medical provider? Nonah Mattes, MD Are you able to complete your ADL's? yes Do you have a history of trauma, physical, emotional, or sexual abuse? none Do you have any family history of heart problems? Father passed away in 05/31/2000 Do you smoke now or past usage? Yes  (2 cigarettes a day- states now that he has been hospitalized he has quit  Do you drink alcohol now or past usage? Yes (40oz can a day)   Are you currently using illegal drugs or misuse of medication or past usage? Yes recently had cocaine but "only time" admits to previous use of marijuana but "it didn't do anything for me so I stopped using".   Have you ever been treated for substance abuse? No   Mental Health History How have you been feeling in the past year? good Have you ever had any problems with depression, anxiety or other mental health issues? no Do you see a counselor, psychiatrist or therapist?  n/a If you are currently experiencing problems are you interested in talking with a professional? No Have you or are you taking medications for anxiety/depression or any mental health concerns?  No  Current Medications: n/a What are your coping strategies under stressful situations? "I don't really get mad or sad" "laugh a lot" Are there any other stressors in  your life? no Have you had any past or current thoughts of suicide? no How many hours do you sleep at night? 8 hrs How is your appetite? yes Would you be interested in attending the LVAD support group? yes  PHQ2 Depression Scale:  0  Legal Do you currently have any legal issues/problems?  "driving issues in the past" Do you have a Durable POA?  no   Plan for VAD Implementation Do you know and understand what happens during the VAD surgery? Patient Verbalizes Understanding  of surgery and able to describe details What do you know about the risks and side effect associated with VAD surgery? Patient Verbalizes Understanding  of risks (infection, stroke and death) Explain what will happen right after surgery: Patient Verbalizes Understanding  of OR to ICU and will be intubated What is your plan for transportation for the first 8 weeks post-surgery? (Patients are not recommended to drive post-surgery for 8 weeks)  Driver:  Patient reports family will assist with transport. Mother is unable to drive long distances Do  you have airbags in your vehicle?  There is a risk of discharging the device if the airbag were to deploy. What do you know about your diet post-surgery? Patient Verbalizes Understanding  of Heart healthy How do you plan to monitor your medications, current and future?   Will look into a pill box How do you plan to complete ADL's post-surgery?  Family to help. Will it be difficult to ask for help from your caregivers?  NO  Please explain what you hope will be improved about your life as a result of receiving the LVAD? "I have no idea" Patient admits to feeling overwhelmed at the time of assessment Please tell me your biggest concern or fear about living with the LVAD?   Not yet How do you cope with your concerns and fears?  laugh Please explain your understanding of how their body will change?  unsure Are you worried about these changes? unsure Do you see any barriers to your  surgery or follow-up? unsure  Understanding of LVAD Patient states understanding of the following: Surgical procedures and risks, Electrical need for LVAD (3 prong outlets), Safety precautions with LVAD (water, etc.), LVAD daily self-care (dressing changes, computer check, extra supplies), Outpatient follow up (LVAD clinic appts, monitoring blood thinners) and Need for Emergency Planning  Discussed and Reviewed with Patient and Caregiver  Patient's current level of motivation to prepare for LVAD: Cautious but acknowledges he needs it.  Patient's present Level of Consent for LVAD: Yes     Education provided to patient/family/caregiver:   Caregiver role and responsibiltiy, Financial planning for LVAD, Role of Clinical Social Worker and Signs of Depression and Anxiety    Clinical Interventions Needed:    CSW will assist with completion of SSI application and provide any follow up that may arise. CSW will monitor substance use (tobacco, alcohol and illegal drug) and refer for outpatient treatment if further use identified. Patient denies any need at this time.  CSW will monitor signs and symptoms of depression and assist with adjustment to life with an LVAD.  CSW encouraged attendance with the LVAD Support Group to assist further with adjustment and post implant peer support.   Clinical Impressions/Recommendations:   Mr. Dickson Kostelnik who prefers to be called [name] is a 34yo male who resides at home with his mother and brothers. He states he is single with no children. He was working odd jobs prior to being hospitalized and last worked in 2019. He sates he was laid off from [employer] where he worked in [employer]. He reports that his family assist with his basic needs. He states he was never sick nor had been to a MD. He states he would like to be a full code and denies having a Living Will. He lives at home with his 51yo mother who works full time in the laundry at a SNF along with 2  brothers and a nephew. He has multiple extended family members in the area. His sister who lives nearby also works a Insurance claims handler" at the SNF and he reports is very supportive. He is active with his local church and enjoys "chillin" with his family. He denies an history of trauma or emotional, physical or sexual abuse. He admits to tobacco use stating he smoked 1-2 cigs a day starting at a young age. He admits to drinking a 40oz can of beer a day stating "it lasts me all day". He states that he used to use Marijuana years ago but not recently.  He admits to recent use of cocaine but stated it "was one time only". He denies any mental health issues or concerns and scored a 0 on the PHQ-2. He states that "I really don't get mad or sad just try and laugh a lot". When asked what patient hopes to be improved about his life with an LVAD he stated "I have no idea". Patient acknowledged feeling a bit overwhelmed. Mr. Petrosyan denies any barriers at this time.  Patient appears to be overwhelmed although acknowledges the need for LVAD due to his current health. CSW will continue to follow and discuss/assess caregiver role with family at bedside tomorrow.   Marcy Siren, [place] CCSW-MCS  [phone]

## 2019-02-23 LAB — COOXEMETRY PANEL
Carboxyhemoglobin: 1 % (ref 0.5–1.5)
Carboxyhemoglobin: 1.5 % (ref 0.5–1.5)
Methemoglobin: 0.6 % (ref 0.0–1.5)
Methemoglobin: 1 % (ref 0.0–1.5)
O2 Saturation: 51.3 %
O2 Saturation: 57.7 %
Total hemoglobin: 10.9 g/dL — ABNORMAL LOW (ref 12.0–16.0)
Total hemoglobin: 10.3 g/dL — ABNORMAL LOW (ref 12.0–16.0)

## 2019-02-23 LAB — BASIC METABOLIC PANEL
Anion gap: 10 (ref 5–15)
BUN: 25 mg/dL — ABNORMAL HIGH (ref 6–20)
CO2: 28 mmol/L (ref 22–32)
Calcium: 9.6 mg/dL (ref 8.9–10.3)
Chloride: 94 mmol/L — ABNORMAL LOW (ref 98–111)
Creatinine, Ser: 0.76 mg/dL (ref 0.61–1.24)
GFR calc Af Amer: >60 mL/min (ref >60)
GFR calc non Af Amer: >60 mL/min (ref >60)
Glucose, Bld: 123 mg/dL — ABNORMAL HIGH (ref 70–99)
Potassium: 3.7 mmol/L (ref 3.5–5.1)
Sodium: 132 mmol/L — ABNORMAL LOW (ref 135–145)

## 2019-02-23 LAB — CBC
HCT: 32 % — ABNORMAL LOW (ref 39.0–52.0)
Hemoglobin: 10.5 g/dL — ABNORMAL LOW (ref 13.0–17.0)
MCH: 32.6 pg (ref 26.0–34.0)
MCHC: 32.8 g/dL (ref 30.0–36.0)
MCV: 99.4 fL (ref 80.0–100.0)
Platelets: 327 10*3/uL (ref 150–400)
RBC: 3.22 MIL/uL — ABNORMAL LOW (ref 4.22–5.81)
RDW: 14.6 % (ref 11.5–15.5)
WBC: 7.6 10*3/uL (ref 4.0–10.5)
nRBC: 0 % (ref 0.0–0.2)

## 2019-02-23 LAB — URINALYSIS, ROUTINE W REFLEX MICROSCOPIC
Bilirubin Urine: NEGATIVE
Glucose, UA: NEGATIVE mg/dL
Hgb urine dipstick: NEGATIVE
Ketones, ur: NEGATIVE mg/dL
Leukocytes,Ua: NEGATIVE
Nitrite: NEGATIVE
Protein, ur: NEGATIVE mg/dL
Specific Gravity, Urine: 1.004 — ABNORMAL LOW (ref 1.005–1.030)
pH: 9 — ABNORMAL HIGH (ref 5.0–8.0)

## 2019-02-23 LAB — SURGICAL PCR SCREEN
MRSA, PCR: NEGATIVE
Staphylococcus aureus: NEGATIVE

## 2019-02-23 LAB — MAGNESIUM: Magnesium: 1.9 mg/dL (ref 1.7–2.4)

## 2019-02-23 MED ORDER — FUROSEMIDE 10 MG/ML IJ SOLN
40.0000 mg | Freq: Two times a day (BID) | INTRAMUSCULAR | Status: AC
  Administered 2019-02-23 (×2): 40 mg via INTRAVENOUS
  Filled 2019-02-23 (×2): qty 4

## 2019-02-23 MED ORDER — MAGNESIUM SULFATE 2 GM/50ML IV SOLN
2.0000 g | Freq: Once | INTRAVENOUS | Status: AC
  Administered 2019-02-23: 2 g via INTRAVENOUS
  Filled 2019-02-23: qty 50

## 2019-02-23 MED ORDER — POTASSIUM CHLORIDE CRYS ER 20 MEQ PO TBCR
40.0000 meq | EXTENDED_RELEASE_TABLET | Freq: Four times a day (QID) | ORAL | Status: AC
  Administered 2019-02-23 (×2): 40 meq via ORAL
  Filled 2019-02-23 (×2): qty 2

## 2019-02-23 NOTE — Progress Notes (Signed)
Caregiver questions Please explain what you hope will be improved about your life and loved one's life as a result of receiving the LVAD? "I hope that he will cut down on the drinking" What is your biggest concern or fear about caregiving with an LVAD patient? I am afraid I might touch it wrong and hurt him What is your plan for availability to provide care 24/7 x2 weeks post op and dressing changes ongoing? Plans to take FMLA Who is the relief/backup caregiver and what is their availability? Lenore will be able to assist mother with care needs. Preferred method of learning? Written, Verbal and Hands on   Do you drive? Yes, but not long distances. She reports she will find family and friends to assist with transport to and from clinic appointments. How do you handle stressful situations? Pretty good. At the first ,moment I get a little shaky but then level off. Do you think you can do this? YES Is there anything that concerns about caregiving? None  Do you provide caregiving to anyone else? None    Caregiver's current level of motivation to prepare for LVAD: Highly motivated for improved life for son Caregiver's present level of consent for LVAD: Ready  Patient's mother appears very supportive and willing to do whatever is necessary for the care of her son. Patient appears to be a good candidate for LVAD implantation with good family support. CSW will continue to follow for support and further assistance with SSI application. Lasandra Beech, LCSW, CCSW-MCS [phone]

## 2019-02-23 NOTE — Progress Notes (Signed)
EVENING ROUNDS NOTE :     [address]       [place] [address]             [phone]                 12 Days Post-Op Procedure(s) (LRB): RIGHT/LEFT HEART CATH AND CORONARY ANGIOGRAPHY (N/A)   Total Length of Stay:  LOS: 14 days  Events:  No events Remains on dob and milr   BP (!) 88/60   Pulse (!) 119   Temp 98.5 F (36.9 C) (Oral)   Resp (!) 33   Ht 5\' 8"  (1.727 m)   Wt 48.9 kg   SpO2 97%   BMI 16.39 kg/m   CVP:  [5 mmHg-18 mmHg] 16 mmHg     . DOBUTamine 5 mcg/kg/min (02/23/19 0800)  . milrinone 0.5 mcg/kg/min (02/23/19 0832)    I/O last 3 completed shifts: In: 1418.6 [P.O.:1080; I.V.:338.6] Out: 3125 [Urine:3125]   CBC Latest Ref Rng & Units 02/23/2019 02/19/2019 02/18/2019  WBC 4.0 - 10.5 K/uL 7.6 7.5 5.9  Hemoglobin 13.0 - 17.0 g/dL 10.5(L) 10.7(L) 10.6(L)  Hematocrit 39.0 - 52.0 % 32.0(L) 32.8(L) 33.3(L)  Platelets 150 - 400 K/uL 327 191 175    BMP Latest Ref Rng & Units 02/23/2019 02/22/2019 02/21/2019  Glucose 70 - 99 mg/dL 02/23/2019) 761(Y) 073(X)  BUN 6 - 20 mg/dL 106(Y) 69(S) 85(I)  Creatinine 0.61 - 1.24 mg/dL 62(V 0.35 0.09  Sodium 135 - 145 mmol/L 132(L) 135 135  Potassium 3.5 - 5.1 mmol/L 3.7 3.6 3.8  Chloride 98 - 111 mmol/L 94(L) 93(L) 94(L)  CO2 22 - 32 mmol/L 28 31 29   Calcium 8.9 - 10.3 mg/dL 9.6 9.6 9.6    ABG    Component Value Date/Time   PHART 7.472 (H) 02/11/2019 1013   PCO2ART 31.9 (L) 02/11/2019 1013   PO2ART 87.0 02/11/2019 1013   HCO3 25.3 02/11/2019 1035   TCO2 26 02/11/2019 1035   O2SAT 57.7 02/23/2019 0826       04/11/2019, MD 02/23/2019 5:05 PM

## 2019-02-23 NOTE — Progress Notes (Addendum)
Patient ID: Jon Martin, male   DOB: 08/05/1985, 34 y.o.   MRN: [id number]     Advanced Heart Failure Rounding Note  PCP-Cardiologist: No primary care provider on file.   Subjective:    34 y.o. 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  - Underwent emergent cath on 1/7 for co-ox in the 20s. However cath with co-ox in 60-70 range so Impella placement deferred. Coronaries clean.  - Milrinone added and co-ox much improved. DBA discontinued 1/11.  - Fall in co-ox 1/15, milrinone and dobutamine titrated, now on milrinone 0.375 and dobutamine 5. CO-OX down to 51%.   Complaining of cough this morning.    Objective:   Weight Range: 48.9 kg Body mass index is 16.39 kg/m.   Vital Signs:   Temp:  [98.1 F (36.7 C)-98.9 F (37.2 C)] 98.9 F (37.2 C) (01/19 0755) Pulse Rate:  [61-136] 119 (01/19 0700) Resp:  [12-41] 22 (01/19 0700) BP: (84-116)/(63-88) 100/76 (01/19 0700) SpO2:  [84 %-100 %] 96 % (01/19 0700) Weight:  [48.9 kg] 48.9 kg (01/19 0500) Last BM Date: 02/22/19  Weight change: Filed Weights   02/21/19 0600 02/22/19 0500 02/23/19 0500  Weight: 47.2 kg 46.6 kg 48.9 kg    Intake/Output:   Intake/Output Summary (Last 24 hours) at 02/23/2019 0818 Last data filed at 02/23/2019 0757 Gross per 24 hour  Intake 936.28 ml  Output 2575 ml  Net -1638.72 ml      Physical Exam  CVP 11-12  General:  In bed.  No resp difficulty HEENT: normal Neck: supple. no JVD. Carotids 2+ bilat; no bruits. No lymphadenopathy or thryomegaly appreciated. Cor: PMI nondisplaced. Regular rate & rhythm. No rubs,  or murmurs. +S3  Lungs: clear Abdomen: soft, nontender, nondistended. No hepatosplenomegaly. No bruits or masses. Good bowel sounds. Extremities: no cyanosis, clubbing, rash, edema. RUE PICC  Neuro: alert & orientedx3, cranial nerves grossly intact. moves all 4 extremities w/o difficulty. Affect pleasant   Telemetry  Sinus Tach 120s personally reviewed.    Labs    CBC Recent Labs    02/23/19 0322  WBC 7.6  HGB 10.5*  HCT 32.0*  MCV 99.4  PLT 327   Basic Metabolic Panel Recent Labs    72/53/66 0408 02/23/19 0322  NA 135 132*  K 3.6 3.7  CL 93* 94*  CO2 31 28  GLUCOSE 164* 123*  BUN 22* 25*  CREATININE 0.87 0.76  CALCIUM 9.6 9.6  MG  --  1.9   Liver Function Tests Recent Labs    02/22/19 1119  AST 27  ALT 24  ALKPHOS 58  BILITOT 0.9  PROT 7.3  ALBUMIN 3.1*   No results for input(s): LIPASE, AMYLASE in the last 72 hours. Cardiac Enzymes No results for input(s): CKTOTAL, CKMB, CKMBINDEX, TROPONINI in the last 72 hours.  BNP: BNP (last 3 results) Recent Labs    02/10/19 0219  BNP 2,048.6*    ProBNP (last 3 results) No results for input(s): PROBNP in the last 8760 hours.   D-Dimer No results for input(s): DDIMER in the last 72 hours. Hemoglobin A1C No results for input(s): HGBA1C in the last 72 hours. Fasting Lipid Panel No results for input(s): CHOL, HDL, LDLCALC, TRIG, CHOLHDL, LDLDIRECT in the last 72 hours. Thyroid Function Tests No results for input(s): TSH, T4TOTAL, T3FREE, THYROIDAB in the last 72 hours.  Invalid input(s): FREET3  Other results:   Imaging    No results found.   Medications:  Scheduled Medications: . Chlorhexidine Gluconate Cloth  6 each Topical Daily  . dextromethorphan-guaiFENesin  2 tablet Oral BID  . digoxin  0.125 mg Oral Daily  . enoxaparin (LOVENOX) injection  40 mg Subcutaneous Q24H  . feeding supplement (ENSURE ENLIVE)  237 mL Oral TID BM  . multivitamin with minerals  1 tablet Oral Daily  . sodium chloride flush  10-40 mL Intracatheter Q12H  . torsemide  40 mg Oral Daily    Infusions: . DOBUTamine 5 mcg/kg/min (02/23/19 0600)  . milrinone 0.375 mcg/kg/min (02/23/19 0600)    PRN Medications: acetaminophen, ALPRAZolam, guaiFENesin-dextromethorphan, ondansetron (ZOFRAN) IV, sodium chloride flush, zolpidem     Assessment/Plan   1.  Acute/Chronic Systolic HF -> cardiogenic shock - ECHO at [facility] EF, 20%.  - ECHO repeated and showed severely reduced EF <20%. RV only mildly down - R/L cath 1/7 normal cors. Output ok   - CO-OX 51% on milrinone 0.375 and dobutamine 5.  - Repeat CO-OX now. Increase milrinone 0.5 mcg.  - Volume status trending up. CVP 11-12 Stop torsemide. Start IV lasix 40 mg twice a day.   -No BB/Arb for now. - Continue digoxin 0.125 mg daily.  - Renal function stable.  - VAD w/u underway. As cardiomyopathy seems relatively new-onset over past few weeks, would like to give him a bit of time to recover but if we cannot stabilize we may have to move to mechanical support sooner. Currently more tenuous and may need temporary support with Impella if deemed LVAD candidate.   2. ID CXR- ? PNA on admit  - Procalcitonin 0.38>0.44>0.69>0.11> 0.10  - Treated w/ rocephin+ doxycycline x 6 days. Course completed 1/11.  - Sputum - 1/9 few candida - Blood cultures NGTD  3. ETOH Abuse -Drinks alcohol daily. ~6 pack per day.   4. Cocaine  -UDS was + at [facility]  -He denies cocaine use.   5. Atrial tach - had several hour episode of atrial tach post cath. Resolved with IV amio. Amio stopped.  6. Hypokalemia/hypomag - K 3.7 today.  - Mag 1.9 . Give 2 grams mag now  Possible LVAD this week. VAD team meeting with his Mom today.    Length of Stay: [place], NP  02/23/2019, 8:18 AM  Advanced Heart Failure Team Pager [phone] (M-F; 7a - 4p)  Please contact [facility] for night-coverage after hours (4p -7a ) and weekends on [email or web address]   Agree with above.   Remains. Tenuous. Co-ox initially 51% on milrinone and dobutamine, On repeat 57% CVP back up. Feels weak + cough  General:  Weak appearing. No resp difficulty HEENT: normal Neck: supple. JVP up Carotids 2+ bilat; no bruits. No lymphadenopathy or thryomegaly appreciated. Cor: PMI laterally displaced. Regular tachy + s3 Lungs:  clear Abdomen: soft, nontender, nondistended. No hepatosplenomegaly. No bruits or masses. Good bowel sounds. Extremities: no cyanosis, clubbing, rash, edema cool  Neuro: alert & orientedx3, cranial nerves grossly intact. moves all 4 extremities w/o difficulty. Affect pleasant  He continues with cardiogenic shock. Unable to wean inotropes. Co-ox marginal despite dual inotrope support. Discussed at [facility]. Will plan VAD on Friday. D/w Dr. PVT at bedside.   Consider repeat RHC +/- IABP support as needed on Thursday.  CRITICAL CARE Performed by: Glori Bickers  Total critical care time: 40 minutes  Critical care time was exclusive of separately billable procedures and treating other patients.  Critical care was necessary to treat or prevent imminent or life-threatening deterioration.  Critical care was  time spent personally by me (independent of midlevel providers or residents) on the following activities: development of treatment plan with patient and/or surrogate as well as nursing, discussions with consultants, evaluation of patient's response to treatment, examination of patient, obtaining history from patient or surrogate, ordering and performing treatments and interventions, ordering and review of laboratory studies, ordering and review of radiographic studies, pulse oximetry and re-evaluation of patient's condition.  Arvilla Meres, MD  11:41 AM

## 2019-02-23 NOTE — Progress Notes (Signed)
12 Days Post-Op Procedure(s) (LRB): RIGHT/LEFT HEART CATH AND CORONARY ANGIOGRAPHY (N/A) Subjective: Patient with borderline hemodynamics despite maximal inotropic support with high-dose milrinone and moderate dose dobutamine. Advanced heart failure team does not feel the patient would be safe for home inotropic support and in fact he is showing evidence of further deterioration to cardiogenic shock on high-dose inotropes.  Patient has been evaluated by the mechanical support service and found to be acceptable for implantable LVAD therapy.  The patient understands he is not a candidate for cardiac transplantation evaluation at this time because a recent confirmed substance and tobacco abuse by drug screen.  His endorgan function remains compensated and proceeding with implantable LVAD surgery is recommended for this hospitalization.  I agree with that recommendation as his long-term survival without mechanical support would be negligible..  I discussed the procedure of LVAD implantation with the patient and he demonstrates his understanding and agrees with the surgery. Objective: Vital signs in last 24 hours: Temp:  [98.1 F (36.7 C)-98.9 F (37.2 C)] 98.9 F (37.2 C) (01/19 0755) Pulse Rate:  [61-136] 118 (01/19 0900) Cardiac Rhythm: Sinus tachycardia (01/19 0800) Resp:  [12-41] 23 (01/19 0900) BP: (84-116)/(63-88) 111/85 (01/19 0900) SpO2:  [84 %-100 %] 96 % (01/19 0900) Weight:  [48.9 kg] 48.9 kg (01/19 0500)  Hemodynamic parameters for last 24 hours: CVP:  [5 mmHg-18 mmHg] 8 mmHg  Intake/Output from previous day: 01/18 0701 - 01/19 0700 In: 945.7 [P.O.:720; I.V.:225.7] Out: 2350 [Urine:2350] Intake/Output this shift: Total I/O In: 517 [P.O.:480; I.V.:19.5; IV Piggyback:17.5] Out: 1025 [Urine:1025]       Exam    General- alert and comfortable, intermittent coughs and dyspnea    Neck-3+ JVD, no cervical adenopathy palpable, no carotid bruit   Lungs-scattered rhonchi, wet   Cor-  regular tachycardia rate and rhythm, no murmur , gallop   Abdomen- soft, non-tender   Extremities - warm, non-tender, minimal edema   Neuro- oriented, appropriate, no focal weakness   Lab Results: Recent Labs    02/23/19 0322  WBC 7.6  HGB 10.5*  HCT 32.0*  PLT 327   BMET:  Recent Labs    02/22/19 0408 02/23/19 0322  NA 135 132*  K 3.6 3.7  CL 93* 94*  CO2 31 28  GLUCOSE 164* 123*  BUN 22* 25*  CREATININE 0.87 0.76  CALCIUM 9.6 9.6    PT/INR: No results for input(s): LABPROT, INR in the last 72 hours. ABG    Component Value Date/Time   PHART 7.472 (H) 02/11/2019 1013   HCO3 25.3 02/11/2019 1035   TCO2 26 02/11/2019 1035   O2SAT 57.7 02/23/2019 0826   CBG (last 3)  No results for input(s): GLUCAP in the last 72 hours.  Assessment/Plan: S/P Procedure(s) (LRB): RIGHT/LEFT HEART CATH AND CORONARY ANGIOGRAPHY (N/A) We will plan implantable HeartMate 3 implantation later this week.  Continue to optimize hemodynamics and fluid prior to surgery.   LOS: 14 days    Kathlee Nations Trigt III 02/23/2019

## 2019-02-24 ENCOUNTER — Inpatient Hospital Stay (HOSPITAL_COMMUNITY): Payer: Medicaid - Out of State

## 2019-02-24 DIAGNOSIS — I509 Heart failure, unspecified: Secondary | ICD-10-CM

## 2019-02-24 LAB — CBC
HCT: 31.7 % — ABNORMAL LOW (ref 39.0–52.0)
Hemoglobin: 10.1 g/dL — ABNORMAL LOW (ref 13.0–17.0)
MCH: 31.8 pg (ref 26.0–34.0)
MCHC: 31.9 g/dL (ref 30.0–36.0)
MCV: 99.7 fL (ref 80.0–100.0)
Platelets: 317 10*3/uL (ref 150–400)
RBC: 3.18 MIL/uL — ABNORMAL LOW (ref 4.22–5.81)
RDW: 14.6 % (ref 11.5–15.5)
WBC: 7.2 10*3/uL (ref 4.0–10.5)
nRBC: 0 % (ref 0.0–0.2)

## 2019-02-24 LAB — COOXEMETRY PANEL
Carboxyhemoglobin: 0.9 % (ref 0.5–1.5)
Carboxyhemoglobin: 1.4 % (ref 0.5–1.5)
Carboxyhemoglobin: 1.5 % (ref 0.5–1.5)
Methemoglobin: 0.5 % (ref 0.0–1.5)
Methemoglobin: 1.1 % (ref 0.0–1.5)
Methemoglobin: 0.9 % (ref 0.0–1.5)
O2 Saturation: 46.8 %
O2 Saturation: 52 %
O2 Saturation: 56.6 %
Total hemoglobin: 15.1 g/dL (ref 12.0–16.0)
Total hemoglobin: 10.6 g/dL — ABNORMAL LOW (ref 12.0–16.0)
Total hemoglobin: 11.1 g/dL — ABNORMAL LOW (ref 12.0–16.0)

## 2019-02-24 LAB — CULTURE, BLOOD (ROUTINE X 2)
Culture: NO GROWTH
Culture: NO GROWTH

## 2019-02-24 LAB — HEPATIC FUNCTION PANEL
ALT: 33 U/L (ref 0–44)
AST: 41 U/L (ref 15–41)
Albumin: 3 g/dL — ABNORMAL LOW (ref 3.5–5.0)
Alkaline Phosphatase: 63 U/L (ref 38–126)
Bilirubin, Direct: 0.2 mg/dL (ref 0.0–0.2)
Indirect Bilirubin: 0.5 mg/dL (ref 0.3–0.9)
Total Bilirubin: 0.7 mg/dL (ref 0.3–1.2)
Total Protein: 7.1 g/dL (ref 6.5–8.1)

## 2019-02-24 LAB — PROTIME-INR
INR: 1.1 (ref 0.8–1.2)
Prothrombin Time: 13.9 seconds (ref 11.4–15.2)

## 2019-02-24 LAB — BASIC METABOLIC PANEL
Anion gap: 8 (ref 5–15)
BUN: 21 mg/dL — ABNORMAL HIGH (ref 6–20)
CO2: 28 mmol/L (ref 22–32)
Calcium: 9.4 mg/dL (ref 8.9–10.3)
Chloride: 96 mmol/L — ABNORMAL LOW (ref 98–111)
Creatinine, Ser: 0.78 mg/dL (ref 0.61–1.24)
GFR calc Af Amer: >60 mL/min (ref >60)
GFR calc non Af Amer: >60 mL/min (ref >60)
Glucose, Bld: 157 mg/dL — ABNORMAL HIGH (ref 70–99)
Potassium: 3.6 mmol/L (ref 3.5–5.1)
Sodium: 132 mmol/L — ABNORMAL LOW (ref 135–145)

## 2019-02-24 MED ORDER — ASPIRIN 81 MG PO CHEW
81.0000 mg | CHEWABLE_TABLET | ORAL | Status: AC
  Administered 2019-02-25: 81 mg via ORAL
  Filled 2019-02-24: qty 1

## 2019-02-24 MED ORDER — SODIUM CHLORIDE 0.9 % IV SOLN: INTRAVENOUS | Status: DC

## 2019-02-24 MED ORDER — SODIUM CHLORIDE 0.9% FLUSH: 3.0000 mL | INTRAVENOUS | Status: DC | PRN

## 2019-02-24 MED ORDER — GENERIC EXTERNAL MEDICATION
Status: DC
Start: ? — End: 2019-02-24

## 2019-02-24 MED ORDER — POTASSIUM CHLORIDE CRYS ER 20 MEQ PO TBCR
40.0000 meq | EXTENDED_RELEASE_TABLET | Freq: Four times a day (QID) | ORAL | Status: AC
  Administered 2019-02-24 (×2): 40 meq via ORAL
  Filled 2019-02-24 (×2): qty 2

## 2019-02-24 MED ORDER — SODIUM CHLORIDE 0.9% FLUSH
3.0000 mL | Freq: Two times a day (BID) | INTRAVENOUS | Status: DC
  Administered 2019-02-24 – 2019-02-25 (×3): 3 mL via INTRAVENOUS

## 2019-02-24 MED ORDER — FUROSEMIDE 10 MG/ML IJ SOLN
40.0000 mg | Freq: Four times a day (QID) | INTRAMUSCULAR | Status: AC
  Administered 2019-02-24 (×2): 40 mg via INTRAVENOUS
  Filled 2019-02-24 (×2): qty 4

## 2019-02-24 MED ORDER — SODIUM CHLORIDE 0.9 % IV SOLN: 250.0000 mL | INTRAVENOUS | Status: DC | PRN

## 2019-02-24 NOTE — Progress Notes (Signed)
CSW met at bedside with patient. He states he is feeling well today and sitting up in bed watching TV. He states he had a good visit with his mother yesterday and hopes to talk with her later today. He will confirm that she dropped off the Child psychotherapist to [employer]. Patient denies any concerns and states he is ready for surgey on Friday. Patient appears in good spirits and ready for surgery on Friday for LVAD implant. CSW continues to follow for supportive intervention throughout implant hospitalization. [name], [place], [place]

## 2019-02-24 NOTE — Plan of Care (Signed)
  Problem: Clinical Measurements: Goal: Ability to maintain clinical measurements within normal limits will improve Outcome: Progressing Goal: Respiratory complications will improve Outcome: Progressing Goal: Cardiovascular complication will be avoided Outcome: Progressing   Problem: Activity: Goal: Risk for activity intolerance will decrease Outcome: Progressing   Problem: Nutrition: Goal: Adequate nutrition will be maintained Outcome: Progressing   Problem: Coping: Goal: Level of anxiety will decrease Outcome: Progressing   Problem: Elimination: Goal: Will not experience complications related to urinary retention Outcome: Progressing   Problem: Pain Managment: Goal: General experience of comfort will improve Outcome: Progressing   Problem: Safety: Goal: Ability to remain free from injury will improve Outcome: Progressing   Problem: Skin Integrity: Goal: Risk for impaired skin integrity will decrease Outcome: Progressing   Problem: Activity: Goal: Capacity to carry out activities will improve Outcome: Progressing

## 2019-02-24 NOTE — Progress Notes (Signed)
13 Days Post-Op Procedure(s) (LRB): RIGHT/LEFT HEART CATH AND CORONARY ANGIOGRAPHY (N/A) Subjective: Cardiac output marginal on hi dose inotropes Plan HM3 VAD implant 1-22 with PA cath placement preop tomorrow PCXR this am clear Objective: Vital signs in last 24 hours: Temp:  [98.2 F (36.8 C)-98.8 F (37.1 C)] 98.3 F (36.8 C) (01/20 0756) Pulse Rate:  [58-124] 124 (01/20 0722) Cardiac Rhythm: Sinus tachycardia (01/20 0000) Resp:  [17-46] 21 (01/20 0722) BP: (86-111)/(57-85) 87/65 (01/20 0700) SpO2:  [67 %-100 %] 94 % (01/20 0722) Weight:  [48 kg] 48 kg (01/20 0722)  Hemodynamic parameters for last 24 hours: CVP:  [0 mmHg-16 mmHg] 11 mmHg  Intake/Output from previous day: 01/19 0701 - 01/20 0700 In: 1272.5 [P.O.:960; I.V.:266.8; IV Piggyback:45.7] Out: 3825 [Urine:3825] Intake/Output this shift: No intake/output data recorded.       Exam    General- alert and comfortable    Neck- no JVD, no cervical adenopathy palpable, no carotid bruit   Lungs- clear without rales, wheezes   Cor- regular rate and rhythm, no murmur , gallop   Abdomen- soft, non-tender   Extremities - warm, non-tender, minimal edema   Neuro- oriented, appropriate, no focal weakness   Lab Results: Recent Labs    02/23/19 0322 02/24/19 0449  WBC 7.6 7.2  HGB 10.5* 10.1*  HCT 32.0* 31.7*  PLT 327 317   BMET:  Recent Labs    02/23/19 0322 02/24/19 0445  NA 132* 132*  K 3.7 3.6  CL 94* 96*  CO2 28 28  GLUCOSE 123* 157*  BUN 25* 21*  CREATININE 0.76 0.78  CALCIUM 9.6 9.4    PT/INR:  Recent Labs    02/24/19 0445  LABPROT 13.9  INR 1.1   ABG    Component Value Date/Time   PHART 7.472 (H) 02/11/2019 1013   HCO3 25.3 02/11/2019 1035   TCO2 26 02/11/2019 1035   O2SAT 52.0 02/24/2019 0730   CBG (last 3)  No results for input(s): GLUCAP in the last 72 hours.  Assessment/Plan: S/P Procedure(s) (LRB): RIGHT/LEFT HEART CATH AND CORONARY ANGIOGRAPHY (N/A) Pending VAD implant for  nonischemic cardiomyopathy- Intermacs 2   LOS: 15 days    Jon Martin 02/24/2019

## 2019-02-24 NOTE — Progress Notes (Addendum)
Patient ID: Jiaire Rosebrook, male   DOB: [birth year], 34 y.o.   MRN: [id number]     Advanced Heart Failure Rounding Note  PCP-Cardiologist: No primary care provider on file.   Subjective:    34 y.o. 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  - Underwent emergent cath on 1/7 for co-ox in the 20s. However cath with co-ox in 60-70 range so Impella placement deferred. Coronaries clean.  - Milrinone added and co-ox much improved. DBA discontinued 1/11.  - Fall in co-ox 1/15, milrinone and dobutamine titrated  Yesterday milrinone increased to 0.5 mcg + dobutamine 5. Started back on IV lasix.   Todays CO-OX 52%.   Denies SOB    Objective:   Weight Range: 48 kg Body mass index is 16.09 kg/m.   Vital Signs:   Temp:  [98.2 F (36.8 C)-98.8 F (37.1 C)] 98.3 F (36.8 C) (01/20 0756) Pulse Rate:  [58-124] 124 (01/20 0722) Resp:  [17-46] 21 (01/20 0722) BP: (86-111)/(57-85) 87/65 (01/20 0700) SpO2:  [67 %-100 %] 94 % (01/20 0722) Weight:  [48 kg] 48 kg (01/20 0722) Last BM Date: 02/22/19  Weight change: Filed Weights   02/22/19 0500 02/23/19 0500 02/24/19 0722  Weight: 46.6 kg 48.9 kg 48 kg    Intake/Output:   Intake/Output Summary (Last 24 hours) at 02/24/2019 0805 Last data filed at 02/24/2019 0700 Gross per 24 hour  Intake 1263.05 ml  Output 3600 ml  Net -2336.95 ml      Physical Exam  CVP 11 General:   No resp difficulty HEENT: normal Neck: supple. JVP 11-12.  Carotids 2+ bilat; no bruits. No lymphadenopathy or thryomegaly appreciated. Cor: PMI nondisplaced. Regular rate & rhythm. No rubs,  or murmurs. +S3  Lungs: clear Abdomen: soft, nontender, nondistended. No hepatosplenomegaly. No bruits or masses. Good bowel sounds. Extremities: no cyanosis, clubbing, rash, edema Neuro: alert & orientedx3, cranial nerves grossly intact. moves all 4 extremities w/o difficulty. Affect pleasant    Telemetry  Sinus Tach 110-120 personally  reviewed  Labs    CBC Recent Labs    02/23/19 0322 02/24/19 0449  WBC 7.6 7.2  HGB 10.5* 10.1*  HCT 32.0* 31.7*  MCV 99.4 99.7  PLT 327 317   Basic Metabolic Panel Recent Labs    50/27/74 0322 02/24/19 0445  NA 132* 132*  K 3.7 3.6  CL 94* 96*  CO2 28 28  GLUCOSE 123* 157*  BUN 25* 21*  CREATININE 0.76 0.78  CALCIUM 9.6 9.4  MG 1.9  --    Liver Function Tests Recent Labs    02/22/19 1119 02/24/19 0445  AST 27 41  ALT 24 33  ALKPHOS 58 63  BILITOT 0.9 0.7  PROT 7.3 7.1  ALBUMIN 3.1* 3.0*   No results for input(s): LIPASE, AMYLASE in the last 72 hours. Cardiac Enzymes No results for input(s): CKTOTAL, CKMB, CKMBINDEX, TROPONINI in the last 72 hours.  BNP: BNP (last 3 results) Recent Labs    02/10/19 0219  BNP 2,048.6*    ProBNP (last 3 results) No results for input(s): PROBNP in the last 8760 hours.   D-Dimer No results for input(s): DDIMER in the last 72 hours. Hemoglobin A1C No results for input(s): HGBA1C in the last 72 hours. Fasting Lipid Panel No results for input(s): CHOL, HDL, LDLCALC, TRIG, CHOLHDL, LDLDIRECT in the last 72 hours. Thyroid Function Tests No results for input(s): TSH, T4TOTAL, T3FREE, THYROIDAB in the last 72 hours.  Invalid input(s): FREET3  Other results:   Imaging    No results found.   Medications:     Scheduled Medications: . Chlorhexidine Gluconate Cloth  6 each Topical Daily  . dextromethorphan-guaiFENesin  2 tablet Oral BID  . digoxin  0.125 mg Oral Daily  . enoxaparin (LOVENOX) injection  40 mg Subcutaneous Q24H  . feeding supplement (ENSURE ENLIVE)  237 mL Oral TID BM  . multivitamin with minerals  1 tablet Oral Daily  . sodium chloride flush  10-40 mL Intracatheter Q12H    Infusions: . DOBUTamine 5 mcg/kg/min (02/24/19 0500)  . milrinone 0.5 mcg/kg/min (02/24/19 0500)    PRN Medications: acetaminophen, ALPRAZolam, guaiFENesin-dextromethorphan, ondansetron (ZOFRAN) IV, sodium chloride  flush, zolpidem     Assessment/Plan   1. Acute/Chronic Systolic HF -> cardiogenic shock - ECHO at [facility] EF, 20%.  - ECHO repeated and showed severely reduced EF <20%. RV only mildly down - R/L cath 1/7 normal cors. Output ok   - CO-OX 52% on milrinone 0.5 mcg  and dobutamine 5.  - CVP 11. Continue 40 mg IV lasix twice a day.   -No BB/Arb for now. - Continue digoxin 0.125 mg daily.  -Renal function stable.  - VAD w/u underway. As cardiomyopathy seems relatively new-onset over past few weeks, would like to give him a bit of time to recover but if we cannot stabilize we may have to move to mechanical support sooner. Currently more tenuous and may need temporary support with Impella if deemed LVAD candidate.  - LVAD set up for Friday.   2. ID CXR- ? PNA on admit  - Procalcitonin 0.38>0.44>0.69>0.11> 0.10  - Treated w/ rocephin+ doxycycline x 6 days. Course completed 1/11.  - Sputum - 1/9 few candida - Blood cultures NGTD  3. ETOH Abuse -Drinks alcohol daily. ~6 pack per day.   4. Cocaine  -UDS was + at [facility]  -He denies cocaine use.   5. Atrial tach - had several hour episode of atrial tach post cath. Resolved with IV amio. Amio stopped.  6. Hypokalemia/hypomag - K 3.6  - Received mag on 1/19     Length of Stay: 15  Amy Clegg, NP  02/24/2019, 8:05 AM  Advanced Heart Failure Team Pager [phone] (M-F; 7a - 4p)  Please contact [facility] for night-coverage after hours (4p -7a ) and weekends on [email or web address]  Agree with above,  Remains very tenuous despite dual inotropes. Co-ox low. CVP 11. Feels fatigued. Tachycardic.   General:  Weak appearing. No resp difficulty HEENT: normal Neck: supple. no JVD. Carotids 2+ bilat; no bruits. No lymphadenopathy or thryomegaly appreciated. Cor: PMI laterally displaced. Regular tachy + s3  Lungs: clear Abdomen: soft, nontender, nondistended. No hepatosplenomegaly. No bruits or masses. Good bowel  sounds. Extremities: no cyanosis, clubbing, rash, edema Neuro: alert & orientedx3, cranial nerves grossly intact. moves all 4 extremities w/o difficulty. Affect pleasant  Remains very tenuous with cardiogenic shock despite dual inotropes. Plan VAD Friday. Will need swan tomorrow with possible IABP for pre-op support. Continue IV lasix  CRITICAL CARE Performed by: Arvilla Meres  Total critical care time: 35 minutes  Critical care time was exclusive of separately billable procedures and treating other patients.  Critical care was necessary to treat or prevent imminent or life-threatening deterioration.  Critical care was time spent personally by me (independent of midlevel providers or residents) on the following activities: development of treatment plan with patient and/or surrogate as well as nursing, discussions with consultants, evaluation of patient's response to treatment,  examination of patient, obtaining history from patient or surrogate, ordering and performing treatments and interventions, ordering and review of laboratory studies, ordering and review of radiographic studies, pulse oximetry and re-evaluation of patient's condition.  Glori Bickers, MD  12:35 PM

## 2019-02-24 NOTE — TOC Initial Note (Signed)
Transition of Care [facility]) - Initial/Assessment Note    Patient Details  Name: Jon Martin MRN: [id number] Date of Birth: May 13, 1985  [facility] CM/SW Contact:    Gala Lewandowsky, RN Phone Number: 02/24/2019, 4:04 PM  Clinical Narrative: Patient presented for acute on chronic systolic heart failure. Patient lives in [place] with his mother in a single family home. Plan for left ventricular assistive device (LVAD)- on Friday 02-26-19. Case Manager will continue to follow for additional transition of care needs as the patient progresses.                      Expected Discharge Plan: Home w Home Health Services Barriers to Discharge: (Plan for LVAD Friday)   Expected Discharge Plan and Services Expected Discharge Plan: Home w Home Health Services In-house Referral: NA Discharge Planning Services: CM Consult Post Acute Care Choice: Home Health, Durable Medical Equipment Living arrangements for the past 2 months: Single Family Home                  Prior Living Arrangements/Services Living arrangements for the past 2 months: Single Family Home Lives with:: Parents(LIves with his mother) Patient language and need for interpreter reviewed:: Yes Do you feel safe going back to the place where you live?: Yes      Need for Family Participation in Patient Care: Yes (Comment) Care giver support system in place?: Yes (comment)   Criminal Activity/Legal Involvement Pertinent to Current Situation/Hospitalization: No - Comment as needed  Activities of Daily Living Home Assistive Devices/Equipment: None ADL Screening (condition at time of admission) Patient's cognitive ability adequate to safely complete daily activities?: Yes Is the patient deaf or have difficulty hearing?: No Does the patient have difficulty seeing, even when wearing glasses/contacts?: No Does the patient have difficulty concentrating, remembering, or making decisions?: No Patient able to  express need for assistance with ADLs?: Yes Does the patient have difficulty dressing or bathing?: No Independently performs ADLs?: Yes (appropriate for developmental age) Does the patient have difficulty walking or climbing stairs?: No Weakness of Legs: None Weakness of Arms/Hands: None  Permission Sought/Granted Permission sought to share information with : Family Supports    Emotional Assessment Appearance:: Appears stated age Attitude/Demeanor/Rapport: Engaged Affect (typically observed): Appropriate     Psych Involvement: No (comment)  Admission diagnosis:  Dilated cardiomyopathy (HCC) [I42.0] Patient Active Problem List   Diagnosis Date Noted  . Congestive heart failure (HCC)   . Cardiogenic shock (HCC)   . Goals of care, counseling/discussion   . Preoperative evaluation to rule out surgical contraindication   . Protein-calorie malnutrition, severe 02/11/2019  . Dilated cardiomyopathy (HCC) 02/10/2019   PCP:  Nonah Mattes, MD Pharmacy:   [facility] [address] [place] - [address] [place] [address] Phone: [phone] Fax: [phone]  [id number] [address], [place] - [address] [place] [address] Phone: [phone] Fax: [phone]     Social Determinants of Health (SDOH) Interventions    Readmission Risk Interventions No flowsheet data found.

## 2019-02-24 NOTE — Progress Notes (Signed)
 Nutrition Follow-up  DOCUMENTATION CODES:   Underweight, Severe malnutrition in context of chronic illness  INTERVENTION:   Continue Ensure Enlive po TID, each supplement provides 350 kcal and 20 grams of protein  NUTRITION DIAGNOSIS:   Severe Malnutrition related to chronic illness(CHF) as evidenced by severe fat depletion, severe muscle depletion.  Being addressed via supplements  GOAL:   Patient will meet greater than or equal to 90% of their needs  Met  MONITOR:   PO intake, Supplement acceptance, Labs, Weight trends, I & O's  REASON FOR ASSESSMENT:   Consult LVAD Eval  ASSESSMENT:   34 year old male with PMH of NIDCM, EF 20%, LifeVest, EtOH abuse, drug abuse. Pt transferred from [facility] for consideration for LVAD.  Plan for LVAD on Friday  01/06 - PICC placed 01/07 - RHC/LHC, Swan  Pt continues with very good appetite; eating 75-100% of meals and drinking the Ensure Enlive shakes; receiving TID  Weight relatively stable since the 11th; current wt 48 kg  Labs: sodium 132 (L), Creatinine Meds: lasix, MVI, KCL   Diet Order:   Diet Order            Diet Heart Room service appropriate? Yes; Fluid consistency: Thin  Diet effective now              EDUCATION NEEDS:   Education needs have been addressed  Skin:  Skin Assessment: Reviewed RN Assessment  Last BM:  1/11  Height:   Ht Readings from Last 1 Encounters:  02/10/19 5' 8"  (1.727 m)    Weight:   Wt Readings from Last 1 Encounters:  02/24/19 48 kg    Ideal Body Weight:  70 kg  BMI:  Body mass index is 16.09 kg/m.  Estimated Nutritional Needs:   Kcal:  2100-2300  Protein:  95-110 grams  Fluid:  >/= 1.8 L    Jeremie Abdelaziz MS, RDN, LDN, CNSC [phone] Pager  [phone] Weekend/On-Call Pager

## 2019-02-24 NOTE — Progress Notes (Signed)
Chaplain engaged in follow-up visit with Jon Martin.  [name] expressed some fear over his upcoming procedure.  Chaplain offered support.  [name] expressed that he has been praying and thinking.   Chaplain will continue to follow-up.

## 2019-02-25 ENCOUNTER — Encounter (HOSPITAL_COMMUNITY)
Admission: AD | Disposition: A | Discharge status: Home Health | Payer: Self-pay | Source: Other Acute Inpatient Hospital | Attending: Internal Medicine

## 2019-02-25 HISTORY — PX: RIGHT HEART CATH: CATH118263

## 2019-02-25 LAB — BASIC METABOLIC PANEL
Anion gap: 10 (ref 5–15)
BUN: 21 mg/dL — ABNORMAL HIGH (ref 6–20)
CO2: 28 mmol/L (ref 22–32)
Calcium: 9.6 mg/dL (ref 8.9–10.3)
Chloride: 95 mmol/L — ABNORMAL LOW (ref 98–111)
Creatinine, Ser: 0.81 mg/dL (ref 0.61–1.24)
GFR calc Af Amer: >60 mL/min (ref >60)
GFR calc non Af Amer: >60 mL/min (ref >60)
Glucose, Bld: 195 mg/dL — ABNORMAL HIGH (ref 70–99)
Potassium: 3.8 mmol/L (ref 3.5–5.1)
Sodium: 133 mmol/L — ABNORMAL LOW (ref 135–145)

## 2019-02-25 LAB — POCT I-STAT EG7
Acid-Base Excess: 4 mmol/L — ABNORMAL HIGH (ref 0.0–2.0)
Acid-Base Excess: 4 mmol/L — ABNORMAL HIGH (ref 0.0–2.0)
Bicarbonate: 28.9 mmol/L — ABNORMAL HIGH (ref 20.0–28.0)
Bicarbonate: 28.5 mmol/L — ABNORMAL HIGH (ref 20.0–28.0)
Calcium, Ion: 1.26 mmol/L (ref 1.15–1.40)
Calcium, Ion: 1.29 mmol/L (ref 1.15–1.40)
HCT: 32 % — ABNORMAL LOW (ref 39.0–52.0)
HCT: 32 % — ABNORMAL LOW (ref 39.0–52.0)
Hemoglobin: 10.9 g/dL — ABNORMAL LOW (ref 13.0–17.0)
Hemoglobin: 10.9 g/dL — ABNORMAL LOW (ref 13.0–17.0)
O2 Saturation: 67 %
O2 Saturation: 67 %
Potassium: 3.6 mmol/L (ref 3.5–5.1)
Potassium: 3.8 mmol/L (ref 3.5–5.1)
Sodium: 138 mmol/L (ref 135–145)
Sodium: 136 mmol/L (ref 135–145)
TCO2: 30 mmol/L (ref 22–32)
TCO2: 30 mmol/L (ref 22–32)
pCO2, Ven: 44.3 mmHg (ref 44.0–60.0)
pCO2, Ven: 42.8 mmHg — ABNORMAL LOW (ref 44.0–60.0)
pH, Ven: 7.422 (ref 7.250–7.430)
pH, Ven: 7.432 — ABNORMAL HIGH (ref 7.250–7.430)
pO2, Ven: 35 mmHg (ref 32.0–45.0)
pO2, Ven: 34 mmHg (ref 32.0–45.0)

## 2019-02-25 LAB — COOXEMETRY PANEL
Carboxyhemoglobin: 1.4 % (ref 0.5–1.5)
Methemoglobin: 0.9 % (ref 0.0–1.5)
O2 Saturation: 49.9 %
Total hemoglobin: 10.4 g/dL — ABNORMAL LOW (ref 12.0–16.0)

## 2019-02-25 LAB — CBC
HCT: 31.4 % — ABNORMAL LOW (ref 39.0–52.0)
Hemoglobin: 10.3 g/dL — ABNORMAL LOW (ref 13.0–17.0)
MCH: 32.5 pg (ref 26.0–34.0)
MCHC: 32.8 g/dL (ref 30.0–36.0)
MCV: 99.1 fL (ref 80.0–100.0)
Platelets: 338 10*3/uL (ref 150–400)
RBC: 3.17 MIL/uL — ABNORMAL LOW (ref 4.22–5.81)
RDW: 14.6 % (ref 11.5–15.5)
WBC: 6.6 10*3/uL (ref 4.0–10.5)
nRBC: 0 % (ref 0.0–0.2)

## 2019-02-25 SURGERY — RIGHT HEART CATH
Anesthesia: LOCAL

## 2019-02-25 MED ORDER — SODIUM CHLORIDE 0.9 % IV SOLN
INTRAVENOUS | Status: DC
  Filled 2019-02-25: qty 30

## 2019-02-25 MED ORDER — VANCOMYCIN HCL 1 G IV SOLR
1000.0000 mg | INTRAVENOUS | Status: DC
  Filled 2019-02-25: qty 1000

## 2019-02-25 MED ORDER — DEXMEDETOMIDINE HCL IN NACL 400 MCG/100ML IV SOLN
0.1000 ug/kg/h | INTRAVENOUS | Status: DC
  Filled 2019-02-25: qty 100

## 2019-02-25 MED ORDER — SODIUM CHLORIDE 0.9 % IV SOLN
1.5000 g | INTRAVENOUS | Status: DC
  Filled 2019-02-25: qty 1.5

## 2019-02-25 MED ORDER — CHLORHEXIDINE GLUCONATE CLOTH 2 % EX PADS
6.0000 | MEDICATED_PAD | Freq: Once | CUTANEOUS | Status: AC
  Administered 2019-02-25: 6 via TOPICAL

## 2019-02-25 MED ORDER — CHLORHEXIDINE GLUCONATE CLOTH 2 % EX PADS: 6.0000 | MEDICATED_PAD | Freq: Once | CUTANEOUS | Status: DC

## 2019-02-25 MED ORDER — VANCOMYCIN HCL IN DEXTROSE 1-5 GM/200ML-% IV SOLN
1000.0000 mg | INTRAVENOUS | Status: DC
  Filled 2019-02-25: qty 200

## 2019-02-25 MED ORDER — TEMAZEPAM 15 MG PO CAPS: 15.0000 mg | ORAL_CAPSULE | Freq: Once | ORAL | Status: DC | PRN

## 2019-02-25 MED ORDER — FLUCONAZOLE IN SODIUM CHLORIDE 400-0.9 MG/200ML-% IV SOLN
400.0000 mg | INTRAVENOUS | Status: DC
  Filled 2019-02-25: qty 200

## 2019-02-25 MED ORDER — DOPAMINE-DEXTROSE 3.2-5 MG/ML-% IV SOLN
0.0000 ug/kg/min | INTRAVENOUS | Status: DC
  Filled 2019-02-25: qty 250

## 2019-02-25 MED ORDER — DOBUTAMINE IN D5W 4-5 MG/ML-% IV SOLN
2.0000 ug/kg/min | INTRAVENOUS | Status: DC
  Filled 2019-02-25: qty 250

## 2019-02-25 MED ORDER — TRANEXAMIC ACID (OHS) BOLUS VIA INFUSION
15.0000 mg/kg | INTRAVENOUS | Status: DC
  Filled 2019-02-25: qty 725

## 2019-02-25 MED ORDER — VANCOMYCIN HCL IN DEXTROSE 1-5 GM/200ML-% IV SOLN
1000.0000 mg | INTRAVENOUS | Status: AC
  Administered 2019-02-26: 1000 mg via INTRAVENOUS
  Filled 2019-02-25: qty 200

## 2019-02-25 MED ORDER — TRANEXAMIC ACID 1000 MG/10ML IV SOLN
1.5000 mg/kg/h | INTRAVENOUS | Status: DC
  Filled 2019-02-25: qty 25

## 2019-02-25 MED ORDER — LIDOCAINE HCL (PF) 1 % IJ SOLN
INTRAMUSCULAR | Status: AC
  Filled 2019-02-25: qty 30

## 2019-02-25 MED ORDER — PHENYLEPHRINE HCL-NACL 20-0.9 MG/250ML-% IV SOLN
0.0000 ug/min | INTRAVENOUS | Status: DC
  Filled 2019-02-25: qty 250

## 2019-02-25 MED ORDER — HEPARIN (PORCINE) IN NACL 1000-0.9 UT/500ML-% IV SOLN
INTRAVENOUS | Status: AC
  Filled 2019-02-25: qty 500

## 2019-02-25 MED ORDER — CHLORHEXIDINE GLUCONATE CLOTH 2 % EX PADS: 6.0000 | MEDICATED_PAD | Freq: Once | CUTANEOUS | Status: AC

## 2019-02-25 MED ORDER — NITROGLYCERIN IN D5W 200-5 MCG/ML-% IV SOLN
0.0000 ug/min | INTRAVENOUS | Status: DC
  Filled 2019-02-25: qty 250

## 2019-02-25 MED ORDER — TRANEXAMIC ACID (OHS) PUMP PRIME SOLUTION
2.0000 mg/kg | INTRAVENOUS | Status: DC
  Filled 2019-02-25: qty 0.97

## 2019-02-25 MED ORDER — POTASSIUM CHLORIDE 2 MEQ/ML IV SOLN
80.0000 meq | INTRAVENOUS | Status: DC
  Filled 2019-02-25: qty 40

## 2019-02-25 MED ORDER — FLUCONAZOLE IN SODIUM CHLORIDE 400-0.9 MG/200ML-% IV SOLN
400.0000 mg | INTRAVENOUS | Status: AC
  Administered 2019-02-26: 400 mg via INTRAVENOUS
  Filled 2019-02-25: qty 200

## 2019-02-25 MED ORDER — MIDAZOLAM HCL 2 MG/2ML IJ SOLN
INTRAMUSCULAR | Status: AC
  Filled 2019-02-25: qty 2

## 2019-02-25 MED ORDER — DEXMEDETOMIDINE HCL IN NACL 400 MCG/100ML IV SOLN
0.1000 ug/kg/h | INTRAVENOUS | Status: AC
  Administered 2019-02-26: .3 ug/kg/h via INTRAVENOUS
  Filled 2019-02-25: qty 100

## 2019-02-25 MED ORDER — NOREPINEPHRINE 4 MG/250ML-% IV SOLN
0.0000 ug/min | INTRAVENOUS | Status: DC
  Filled 2019-02-25: qty 250

## 2019-02-25 MED ORDER — HEPARIN (PORCINE) IN NACL 1000-0.9 UT/500ML-% IV SOLN
INTRAVENOUS | Status: DC | PRN
  Administered 2019-02-25 (×2): 500 mL

## 2019-02-25 MED ORDER — SODIUM CHLORIDE 0.9 % IV SOLN
750.0000 mg | INTRAVENOUS | Status: AC
  Administered 2019-02-26: 750 mg via INTRAVENOUS
  Filled 2019-02-25: qty 750

## 2019-02-25 MED ORDER — EPINEPHRINE PF 1 MG/ML IJ SOLN
0.0000 ug/min | INTRAVENOUS | Status: AC
  Administered 2019-02-26: 2 ug/min via INTRAVENOUS
  Filled 2019-02-25: qty 4

## 2019-02-25 MED ORDER — MAGNESIUM SULFATE 50 % IJ SOLN
40.0000 meq | INTRAMUSCULAR | Status: DC
  Filled 2019-02-25: qty 9.85

## 2019-02-25 MED ORDER — MIDAZOLAM HCL 2 MG/2ML IJ SOLN
INTRAMUSCULAR | Status: DC | PRN
  Administered 2019-02-25: 1 mg via INTRAVENOUS

## 2019-02-25 MED ORDER — SODIUM CHLORIDE 0.9 % IV SOLN
1.5000 g | INTRAVENOUS | Status: AC
  Administered 2019-02-26: 1.5 g via INTRAVENOUS
  Filled 2019-02-25: qty 1.5

## 2019-02-25 MED ORDER — FENTANYL CITRATE (PF) 100 MCG/2ML IJ SOLN
INTRAMUSCULAR | Status: DC | PRN
  Administered 2019-02-25: 25 ug via INTRAVENOUS

## 2019-02-25 MED ORDER — CHLORHEXIDINE GLUCONATE 0.12 % MT SOLN
15.0000 mL | Freq: Once | OROMUCOSAL | Status: AC
  Administered 2019-02-26: 06:00:00 15 mL via OROMUCOSAL
  Filled 2019-02-25: qty 15

## 2019-02-25 MED ORDER — SODIUM CHLORIDE 0.9 % IV SOLN
600.0000 mg | INTRAVENOUS | Status: AC
  Administered 2019-02-26: 600 mg via INTRAVENOUS
  Filled 2019-02-25: qty 600

## 2019-02-25 MED ORDER — INSULIN REGULAR(HUMAN) IN NACL 100-0.9 UT/100ML-% IV SOLN
INTRAVENOUS | Status: DC
  Filled 2019-02-25: qty 100

## 2019-02-25 MED ORDER — SODIUM CHLORIDE 0.9 % IV SOLN
INTRAVENOUS | Status: AC | PRN
  Administered 2019-02-25: 250 mL via INTRAVENOUS

## 2019-02-25 MED ORDER — VASOPRESSIN 20 UNIT/ML IV SOLN
0.0400 [IU]/min | INTRAVENOUS | Status: DC
  Filled 2019-02-25: qty 2

## 2019-02-25 MED ORDER — SODIUM CHLORIDE 0.9 % IV SOLN
750.0000 mg | INTRAVENOUS | Status: DC
  Filled 2019-02-25: qty 750

## 2019-02-25 MED ORDER — BISACODYL 5 MG PO TBEC: 5.0000 mg | DELAYED_RELEASE_TABLET | Freq: Once | ORAL | Status: DC

## 2019-02-25 MED ORDER — MILRINONE LACTATE IN DEXTROSE 20-5 MG/100ML-% IV SOLN
0.3000 ug/kg/min | INTRAVENOUS | Status: DC
  Filled 2019-02-25: qty 100

## 2019-02-25 MED ORDER — SODIUM CHLORIDE 0.9 % IV SOLN
600.0000 mg | INTRAVENOUS | Status: DC
  Filled 2019-02-25: qty 600

## 2019-02-25 MED ORDER — INSULIN REGULAR(HUMAN) IN NACL 100-0.9 UT/100ML-% IV SOLN
INTRAVENOUS | Status: AC
  Administered 2019-02-26: 1.4 [IU]/h via INTRAVENOUS
  Filled 2019-02-25: qty 100

## 2019-02-25 MED ORDER — TRANEXAMIC ACID (OHS) BOLUS VIA INFUSION
15.0000 mg/kg | INTRAVENOUS | Status: AC
  Administered 2019-02-26: 08:00:00 724.5 mg via INTRAVENOUS
  Filled 2019-02-25: qty 725

## 2019-02-25 MED ORDER — EPINEPHRINE PF 1 MG/ML IJ SOLN
0.0000 ug/min | INTRAVENOUS | Status: DC
  Filled 2019-02-25: qty 4

## 2019-02-25 MED ORDER — TRANEXAMIC ACID 1000 MG/10ML IV SOLN
1.5000 mg/kg/h | INTRAVENOUS | Status: AC
  Administered 2019-02-26: 1.5 mg/kg/h via INTRAVENOUS
  Filled 2019-02-25: qty 25

## 2019-02-25 MED ORDER — FENTANYL CITRATE (PF) 100 MCG/2ML IJ SOLN
INTRAMUSCULAR | Status: AC
  Filled 2019-02-25: qty 2

## 2019-02-25 MED ORDER — LIDOCAINE HCL (PF) 1 % IJ SOLN
INTRAMUSCULAR | Status: DC | PRN
  Administered 2019-02-25: 2 mL

## 2019-02-25 MED ORDER — MUPIROCIN 2 % EX OINT: 1.0000 "application " | TOPICAL_OINTMENT | Freq: Two times a day (BID) | CUTANEOUS | Status: DC

## 2019-02-25 MED ORDER — NOREPINEPHRINE 4 MG/250ML-% IV SOLN
0.0000 ug/min | INTRAVENOUS | Status: AC
  Administered 2019-02-26: 4 ug/min via INTRAVENOUS
  Filled 2019-02-25: qty 250

## 2019-02-25 SURGICAL SUPPLY — 5 items
CATH SWAN GANZ VIP 7.5F (CATHETERS) ×1 IMPLANT
PACK CARDIAC CATHETERIZATION (CUSTOM PROCEDURE TRAY) ×2 IMPLANT
SHEATH PINNACLE 8F 10CM (SHEATH) ×1 IMPLANT
SLEEVE REPOSITIONING LENGTH 30 (MISCELLANEOUS) ×1 IMPLANT
TRANSDUCER W/STOPCOCK (MISCELLANEOUS) ×2 IMPLANT

## 2019-02-25 NOTE — Progress Notes (Signed)
Day of Surgery Procedure(s) (LRB): RIGHT HEART CATH (N/A) Subjective: Progressive deterioration with low cardiac output requiring high-dose inotropes for nonischemic cardiomyopathy. Right heart cath today shows elevated wedge and cardiac index of 1.8. Plan implantation of HeartMate 3 VAD for advanced heart failure, Intermatic stage II inotropic dependent shock with continued deterioration  Objective: Vital signs in last 24 hours: Temp:  [97.2 F (36.2 C)-98.3 F (36.8 C)] 98.3 F (36.8 C) (01/21 0747) Pulse Rate:  [112-127] 127 (01/21 1004) Cardiac Rhythm: Sinus tachycardia (01/21 0800) Resp:  [16-96] 23 (01/21 1000) BP: (81-115)/(55-86) 107/64 (01/21 1000) SpO2:  [92 %-100 %] 99 % (01/21 1131) Weight:  [48.3 kg] 48.3 kg (01/21 0500)  Hemodynamic parameters for last 24 hours: CVP:  [2 mmHg-12 mmHg] 12 mmHg  Intake/Output from previous day: 01/20 0701 - 01/21 0700 In: 1541.9 [P.O.:1240; I.V.:301.9] Out: 4275 [Urine:4275] Intake/Output this shift: Total I/O In: 85.1 [I.V.:85.1] Out: 275 [Urine:275]  Normal sinus rhythm Lungs clear Minimal peripheral edema Abdomen nondistended soft  Lab Results: Recent Labs    02/24/19 0449 02/25/19 0356  WBC 7.2 6.6  HGB 10.1* 10.3*  HCT 31.7* 31.4*  PLT 317 338   BMET:  Recent Labs    02/24/19 0445 02/25/19 0356  NA 132* 133*  K 3.6 3.8  CL 96* 95*  CO2 28 28  GLUCOSE 157* 195*  BUN 21* 21*  CREATININE 0.78 0.81  CALCIUM 9.4 9.6    PT/INR:  Recent Labs    02/24/19 0445  LABPROT 13.9  INR 1.1   ABG    Component Value Date/Time   PHART 7.472 (H) 02/11/2019 1013   HCO3 25.3 02/11/2019 1035   TCO2 26 02/11/2019 1035   O2SAT 49.9 02/25/2019 0356   CBG (last 3)  No results for input(s): GLUCAP in the last 72 hours.  Assessment/Plan: S/P Procedure(s) (LRB): RIGHT HEART CATH (N/A) HeartMate 3 implantation in a.m. Details of surgical procedure and expected benefits and associated risks including bleeding,  stroke, RV failure, organ failure, infection, death reviewed with patient.  He agrees to proceed with surgery.   LOS: 16 days    Kathlee Nations Trigt III 02/25/2019

## 2019-02-25 NOTE — Progress Notes (Signed)
[name] has been discussed with the VAD Medical Review board on 02/22/19. The team feels as if the patient is a good candidate for Destination LVAD therapy. The patient meets criteria for a LVAD implant as listed below:  1)  Has failed to respond to optimal medical management (including beta-blockers and ACE inhibitors if tolerated) for at least 45 of the last 60 days, or have been balloon dependent for 7 days, or IV inotrope dependent for 14 days; and,       *On Inotropes Milrinone and Dobutamine started at outside hospital on 02/07/19  2)  Has a left ventricular ejection fraction (LVEF) < 25% and, have demonstrated functional limitation with a peak oxygen consumption of <14 ml/kg/min unless balloon pump or inotrope dependent or physically unable to perform the test.         *EF <20%  By echo (date)  02/10/19        3)  Social work and palliative care evaluations demonstrate appropriate support system in place for discharge to home with a VAD and that end of life discussions have taken place. Both services have expressed no concern regarding patient's candidacy.         *Social work consult (date): 02/22/19 [name] (date): 02/08/95 [name]        *Name: Jon Martin  5)  Deemed appropriate by our financial coordinator: 02/24/19        Prior approval: [place] Auth # [id number] [place]  6)  VAD Coordinator, Judson Roch has met with patient and caregiver, shown them the VAD equipment and discussed with the patient and caregiver about lifestyle changes necessary for success on mechanical circulatory device.        *Met with Ms Jon Martin and [name] on 02/23/19        *Consent for VAD Evaluation/Caregiver Agreement/HIPPA Release/Photo Release signed on 02/10/19  7)  Patient has been discussed with [facility] (Dr.Devore) and felt to be an  appropriate candidate for Destination therapy LVAD due to smoking and recent drug use.   8)  Intermacs profile: 2  INTERMACS 1: Critical cardiogenic shock describes a patient who is "crashing and burning", in which a patient has life-threatening hypotension and rapidly escalating inotropic pressor support, with critical organ hypoperfusion often confirmed by worsening acidosis and lactate levels.  INTERMACS 2: Progressive decline describes a patient who has been demonstrated "dependent" on inotropic support but nonetheless shows signs of continuing deterioration in nutrition, renal function, fluid retention, or other major status indicator. Patient profile 2 can also describe a patient with refractory volume overload, perhaps with evidence of impaired perfusion, in whom inotropic infusions cannot be maintained due to tachyarrhythmias, clinical ischemia, or other intolerance.  INTERMACS 3: Stable but inotrope dependent describes a patient who is clinically stable on mild-moderate doses of intravenous inotropes (or has a temporary circulatory support device) after repeated documentation of failure to wean without symptomatic hypotension, worsening symptoms, or progressive organ dysfunction (usually renal). It is critical to monitor nutrition, renal function, fluid balance, and overall status carefully in order to distinguish between a  patient who is truly stable at Patient Profile 3 and a patient who has unappreciated decline rendering them Patient Profile 2. This patient may be either at home or in the hospital.  INTERMACS 4: Resting symptoms describes a patient who is at home on oral therapy but frequently has symptoms of congestion at rest or with activities of daily living (ADL). He or she may have orthopnea, shortness of breath during ADL such as dressing or bathing, gastrointestinal symptoms (abdominal discomfort, nausea, poor appetite), disabling ascites or severe lower extremity edema.  This patient should be carefully considered for more intensive management and surveillance programs, which may in some cases, reveal poor compliance that would compromise outcomes with any therapy.  .  INTERMACS 5: Exertion Intolerant describes a patient who is comfortable at rest but unable to engage in any activity, living predominantly within the house or housebound. This patient has no congestive symptoms, but may have chronically elevated volume status, frequently with renal dysfunction, and may be characterized as exercise intolerant.   INTERMACS 6: Exertion Limited also describes a patient who is comfortable at rest without evidence of fluid overload, but who is able to do some mild activity. Activities of daily living are comfortable and minor activities outside the home such as visiting friends or going to a restaurant can be performed, but fatigue results within a few minutes of any meaningful physical exertion. This patient has occasional episodes of worsening symptoms and is likely to have had a hospitalization for heart failure within the past year.   INTERMACS 7: Advanced NYHA Class 3 describes a patient who is clinically stable with a reasonable level of comfortable activity, despite history of previous decompensation that is not recent. This patient is usually able to walk more than a block. Any decompensation requiring intravenous diuretics or hospitalization within the previous month should make this person a Patient Profile 6 or lower.    9)  NYHA Class: IV  Tanda Rockers RN, BSN VAD Coordinator 24/7 Pager [phone]

## 2019-02-25 NOTE — Progress Notes (Signed)
Patient ID: Jon Martin, male   DOB: 1986-01-05, 34 y.o.   MRN: [id number]     Advanced Heart Failure Rounding Note  PCP-Cardiologist: No primary care provider on file.   Subjective:    34 y.o. 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  - Underwent emergent cath on 1/7 for co-ox in the 20s. However cath with co-ox in 60-70 range so Impella placement deferred. Coronaries clean.  - Milrinone added and co-ox much improved. DBA discontinued 1/11.  - Fall in co-ox 1/15, milrinone and dobutamine titrated  Remains on milrinone 0.5 mcg + dobutamine 5.   Todays CO-OX 50%.    Feels weak. SOB. On IV lasix CVP 12.     Objective:   Weight Range: 48.3 kg Body mass index is 16.19 kg/m.   Vital Signs:   Temp:  [97.2 F (36.2 C)-98.3 F (36.8 C)] 98.3 F (36.8 C) (01/21 0747) Pulse Rate:  [112-127] 127 (01/21 0800) Resp:  [16-96] 19 (01/21 0800) BP: (88-115)/(55-86) 88/69 (01/21 0800) SpO2:  [92 %-100 %] 96 % (01/21 0800) Weight:  [48.3 kg] 48.3 kg (01/21 0500) Last BM Date: 02/24/19  Weight change: Filed Weights   02/23/19 0500 02/24/19 0722 02/25/19 0500  Weight: 48.9 kg 48 kg 48.3 kg    Intake/Output:   Intake/Output Summary (Last 24 hours) at 02/25/2019 0903 Last data filed at 02/25/2019 0800 Gross per 24 hour  Intake 1300.46 ml  Output 3925 ml  Net -2624.54 ml      Physical Exam  CVP 12 General:  Weak appearing. No resp difficulty HEENT: normal Neck: supple.JVP to jaw. Carotids 2+ bilat; no bruits. No lymphadenopathy or thryomegaly appreciated. Cor: PMI laterally displaced. Regular tachy +s3 Lungs: clear Abdomen: soft, nontender, nondistended. No hepatosplenomegaly. No bruits or masses. Good bowel sounds. Extremities: no cyanosis, clubbing, rash, edema Neuro: alert & orientedx3, cranial nerves grossly intact. moves all 4 extremities w/o difficulty. Affect pleasant  Telemetry  Sinus Tach 120s personally reviewed  Labs     CBC Recent Labs    02/24/19 0449 02/25/19 0356  WBC 7.2 6.6  HGB 10.1* 10.3*  HCT 31.7* 31.4*  MCV 99.7 99.1  PLT 317 338   Basic Metabolic Panel Recent Labs    50/09/38 0322 02/23/19 0322 02/24/19 0445 02/25/19 0356  NA 132*   < > 132* 133*  K 3.7   < > 3.6 3.8  CL 94*   < > 96* 95*  CO2 28   < > 28 28  GLUCOSE 123*   < > 157* 195*  BUN 25*   < > 21* 21*  CREATININE 0.76   < > 0.78 0.81  CALCIUM 9.6   < > 9.4 9.6  MG 1.9  --   --   --    < > = values in this interval not displayed.   Liver Function Tests Recent Labs    02/22/19 1119 02/24/19 0445  AST 27 41  ALT 24 33  ALKPHOS 58 63  BILITOT 0.9 0.7  PROT 7.3 7.1  ALBUMIN 3.1* 3.0*   No results for input(s): LIPASE, AMYLASE in the last 72 hours. Cardiac Enzymes No results for input(s): CKTOTAL, CKMB, CKMBINDEX, TROPONINI in the last 72 hours.  BNP: BNP (last 3 results) Recent Labs    02/10/19 0219  BNP 2,048.6*    ProBNP (last 3 results) No results for input(s): PROBNP in the last 8760 hours.   D-Dimer No results for input(s): DDIMER in the  last 72 hours. Hemoglobin A1C No results for input(s): HGBA1C in the last 72 hours. Fasting Lipid Panel No results for input(s): CHOL, HDL, LDLCALC, TRIG, CHOLHDL, LDLDIRECT in the last 72 hours. Thyroid Function Tests No results for input(s): TSH, T4TOTAL, T3FREE, THYROIDAB in the last 72 hours.  Invalid input(s): FREET3  Other results:   Imaging    No results found.   Medications:     Scheduled Medications: . Chlorhexidine Gluconate Cloth  6 each Topical Daily  . dextromethorphan-guaiFENesin  2 tablet Oral BID  . digoxin  0.125 mg Oral Daily  . enoxaparin (LOVENOX) injection  40 mg Subcutaneous Q24H  . feeding supplement (ENSURE ENLIVE)  237 mL Oral TID BM  . multivitamin with minerals  1 tablet Oral Daily  . sodium chloride flush  10-40 mL Intracatheter Q12H  . sodium chloride flush  3 mL Intravenous Q12H    Infusions: . sodium  chloride    . sodium chloride 10 mL/hr at 02/25/19 0800  . DOBUTamine 5 mcg/kg/min (02/25/19 0800)  . milrinone 0.5 mcg/kg/min (02/25/19 0800)    PRN Medications: sodium chloride, acetaminophen, ALPRAZolam, guaiFENesin-dextromethorphan, ondansetron (ZOFRAN) IV, sodium chloride flush, sodium chloride flush, zolpidem     Assessment/Plan   1. Acute/Chronic Systolic HF -> cardiogenic shock - ECHO at [facility] EF, 20%.  - ECHO repeated and showed severely reduced EF <20%. RV only mildly down - R/L cath 1/7 normal cors. Output ok   - CO-OX 50% on milrinone 0.5 mcg  and dobutamine 5.  - CVP 12.  -No BB/Arb for now. - Continue digoxin 0.125 mg daily.  -Renal function stable.  - He has failed inotrope wean and is notw deteriorating again on dual inotropes. Will take to cath lab today for re-insertion of Swan and likely placement of IABP to temporize for durable VAD tomorrow - Continue diuresis   2. ID CXR- ? PNA on admit  - Procalcitonin 0.38>0.44>0.69>0.11> 0.10  - Treated w/ rocephin+ doxycycline x 6 days. Course completed 1/11.  - Sputum - 1/9 few candida - Blood cultures NGTD  3. ETOH Abuse -Drinks alcohol daily. ~6 pack per day.   4. Cocaine  -UDS was + at [facility]  -He denies cocaine use.   5. Atrial tach - had several hour episode of atrial tach post cath. Resolved with IV amio. Amio stopped.  6. Hypokalemia/hypomag - K 3.8 - Received mag on 1/19 - will supp   CRITICAL CARE Performed by: Glori Bickers  Total critical care time: 35 minutes  Critical care time was exclusive of separately billable procedures and treating other patients.  Critical care was necessary to treat or prevent imminent or life-threatening deterioration.  Critical care was time spent personally by me (independent of midlevel providers or residents) on the following activities: development of treatment plan with patient and/or surrogate as well as nursing, discussions with  consultants, evaluation of patient's response to treatment, examination of patient, obtaining history from patient or surrogate, ordering and performing treatments and interventions, ordering and review of laboratory studies, ordering and review of radiographic studies, pulse oximetry and re-evaluation of patient's condition.    Length of Stay: [place], [place]  02/25/2019, 9:03 AM  Advanced Heart Failure Team Pager [phone] (M-F; 7a - 4p)  Please contact [facility] for night-coverage after hours (4p -7a ) and weekends on [email or web address]

## 2019-02-25 NOTE — Interval H&P Note (Signed)
History and Physical Interval Note:  02/25/2019 11:07 AM  Jon Martin  has presented today for surgery, with the diagnosis of heart failure.  The various methods of treatment have been discussed with the patient and family. After consideration of risks, benefits and other options for treatment, the patient has consented to  Procedure(s): RIGHT HEART CATH (N/A) and IABP placement as a surgical intervention.  The patient's history has been reviewed, patient examined, no change in status, stable for surgery.  I have reviewed the patient's chart and labs.  Questions were answered to the patient's satisfaction.     [name]

## 2019-02-25 NOTE — Anesthesia Preprocedure Evaluation (Addendum)
Anesthesia Evaluation  Patient identified by MRN, date of birth, ID band Patient awake    Reviewed: Allergy & Precautions, NPO status , Patient's Chart, lab work & pertinent test results  History of Anesthesia Complications Negative for: history of anesthetic complications  Airway Mallampati: II  TM Distance: >3 FB Neck ROM: Full    Dental no notable dental hx. (+) Dental Advisory Given   Pulmonary Current Smoker,     + decreased breath sounds      Cardiovascular +CHF   Rhythm:Regular Rate:Tachycardia  NICM EF 15-20%   Neuro/Psych negative neurological ROS     GI/Hepatic negative GI ROS, (+)     substance abuse  ,   Endo/Other  negative endocrine ROS  Renal/GU negative Renal ROS     Musculoskeletal negative musculoskeletal ROS (+)   Abdominal   Peds  Hematology negative hematology ROS (+)   Anesthesia Other Findings Day of surgery medications reviewed with the patient.  Reproductive/Obstetrics                            Anesthesia Physical Anesthesia Plan  ASA: IV  Anesthesia Plan: General   Post-op Pain Management:    Induction: Intravenous  PONV Risk Score and Plan: 2 and Ondansetron, Midazolam and Treatment may vary due to age or medical condition  Airway Management Planned: Oral ETT  Additional Equipment: Arterial line, PA Cath, 3D TEE, TEE and Ultrasound Guidance Line Placement  Intra-op Plan:   Post-operative Plan: Post-operative intubation/ventilation  Informed Consent: I have reviewed the patients History and Physical, chart, labs and discussed the procedure including the risks, benefits and alternatives for the proposed anesthesia with the patient or authorized representative who has indicated his/her understanding and acceptance.     Dental advisory given  Plan Discussed with: CRNA, Anesthesiologist and Surgeon  Anesthesia Plan Comments:          Anesthesia Quick Evaluation

## 2019-02-25 NOTE — H&P (View-Only) (Signed)
Patient ID: Jon Martin, male   DOB: 1986-01-05, 34 y.o.   MRN: [id number]     Advanced Heart Failure Rounding Note  PCP-Cardiologist: No primary care provider on file.   Subjective:    34 y.o. 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  - Underwent emergent cath on 1/7 for co-ox in the 20s. However cath with co-ox in 60-70 range so Impella placement deferred. Coronaries clean.  - Milrinone added and co-ox much improved. DBA discontinued 1/11.  - Fall in co-ox 1/15, milrinone and dobutamine titrated  Remains on milrinone 0.5 mcg + dobutamine 5.   Todays CO-OX 50%.    Feels weak. SOB. On IV lasix CVP 12.     Objective:   Weight Range: 48.3 kg Body mass index is 16.19 kg/m.   Vital Signs:   Temp:  [97.2 F (36.2 C)-98.3 F (36.8 C)] 98.3 F (36.8 C) (01/21 0747) Pulse Rate:  [112-127] 127 (01/21 0800) Resp:  [16-96] 19 (01/21 0800) BP: (88-115)/(55-86) 88/69 (01/21 0800) SpO2:  [92 %-100 %] 96 % (01/21 0800) Weight:  [48.3 kg] 48.3 kg (01/21 0500) Last BM Date: 02/24/19  Weight change: Filed Weights   02/23/19 0500 02/24/19 0722 02/25/19 0500  Weight: 48.9 kg 48 kg 48.3 kg    Intake/Output:   Intake/Output Summary (Last 24 hours) at 02/25/2019 0903 Last data filed at 02/25/2019 0800 Gross per 24 hour  Intake 1300.46 ml  Output 3925 ml  Net -2624.54 ml      Physical Exam  CVP 12 General:  Weak appearing. No resp difficulty HEENT: normal Neck: supple.JVP to jaw. Carotids 2+ bilat; no bruits. No lymphadenopathy or thryomegaly appreciated. Cor: PMI laterally displaced. Regular tachy +s3 Lungs: clear Abdomen: soft, nontender, nondistended. No hepatosplenomegaly. No bruits or masses. Good bowel sounds. Extremities: no cyanosis, clubbing, rash, edema Neuro: alert & orientedx3, cranial nerves grossly intact. moves all 4 extremities w/o difficulty. Affect pleasant  Telemetry  Sinus Tach 120s personally reviewed  Labs     CBC Recent Labs    02/24/19 0449 02/25/19 0356  WBC 7.2 6.6  HGB 10.1* 10.3*  HCT 31.7* 31.4*  MCV 99.7 99.1  PLT 317 338   Basic Metabolic Panel Recent Labs    50/09/38 0322 02/23/19 0322 02/24/19 0445 02/25/19 0356  NA 132*   < > 132* 133*  K 3.7   < > 3.6 3.8  CL 94*   < > 96* 95*  CO2 28   < > 28 28  GLUCOSE 123*   < > 157* 195*  BUN 25*   < > 21* 21*  CREATININE 0.76   < > 0.78 0.81  CALCIUM 9.6   < > 9.4 9.6  MG 1.9  --   --   --    < > = values in this interval not displayed.   Liver Function Tests Recent Labs    02/22/19 1119 02/24/19 0445  AST 27 41  ALT 24 33  ALKPHOS 58 63  BILITOT 0.9 0.7  PROT 7.3 7.1  ALBUMIN 3.1* 3.0*   No results for input(s): LIPASE, AMYLASE in the last 72 hours. Cardiac Enzymes No results for input(s): CKTOTAL, CKMB, CKMBINDEX, TROPONINI in the last 72 hours.  BNP: BNP (last 3 results) Recent Labs    02/10/19 0219  BNP 2,048.6*    ProBNP (last 3 results) No results for input(s): PROBNP in the last 8760 hours.   D-Dimer No results for input(s): DDIMER in the  last 72 hours. Hemoglobin A1C No results for input(s): HGBA1C in the last 72 hours. Fasting Lipid Panel No results for input(s): CHOL, HDL, LDLCALC, TRIG, CHOLHDL, LDLDIRECT in the last 72 hours. Thyroid Function Tests No results for input(s): TSH, T4TOTAL, T3FREE, THYROIDAB in the last 72 hours.  Invalid input(s): FREET3  Other results:   Imaging    No results found.   Medications:     Scheduled Medications: . Chlorhexidine Gluconate Cloth  6 each Topical Daily  . dextromethorphan-guaiFENesin  2 tablet Oral BID  . digoxin  0.125 mg Oral Daily  . enoxaparin (LOVENOX) injection  40 mg Subcutaneous Q24H  . feeding supplement (ENSURE ENLIVE)  237 mL Oral TID BM  . multivitamin with minerals  1 tablet Oral Daily  . sodium chloride flush  10-40 mL Intracatheter Q12H  . sodium chloride flush  3 mL Intravenous Q12H    Infusions: . sodium  chloride    . sodium chloride 10 mL/hr at 02/25/19 0800  . DOBUTamine 5 mcg/kg/min (02/25/19 0800)  . milrinone 0.5 mcg/kg/min (02/25/19 0800)    PRN Medications: sodium chloride, acetaminophen, ALPRAZolam, guaiFENesin-dextromethorphan, ondansetron (ZOFRAN) IV, sodium chloride flush, sodium chloride flush, zolpidem     Assessment/Plan   1. Acute/Chronic Systolic HF -> cardiogenic shock - ECHO at [facility] EF, 20%.  - ECHO repeated and showed severely reduced EF <20%. RV only mildly down - R/L cath 1/7 normal cors. Output ok   - CO-OX 50% on milrinone 0.5 mcg  and dobutamine 5.  - CVP 12.  -No BB/Arb for now. - Continue digoxin 0.125 mg daily.  -Renal function stable.  - He has failed inotrope wean and is notw deteriorating again on dual inotropes. Will take to cath lab today for re-insertion of Swan and likely placement of IABP to temporize for durable VAD tomorrow - Continue diuresis   2. ID CXR- ? PNA on admit  - Procalcitonin 0.38>0.44>0.69>0.11> 0.10  - Treated w/ rocephin+ doxycycline x 6 days. Course completed 1/11.  - Sputum - 1/9 few candida - Blood cultures NGTD  3. ETOH Abuse -Drinks alcohol daily. ~6 pack per day.   4. Cocaine  -UDS was + at [facility]  -He denies cocaine use.   5. Atrial tach - had several hour episode of atrial tach post cath. Resolved with IV amio. Amio stopped.  6. Hypokalemia/hypomag - K 3.8 - Received mag on 1/19 - will supp   CRITICAL CARE Performed by: Analeya Luallen  Total critical care time: 35 minutes  Critical care time was exclusive of separately billable procedures and treating other patients.  Critical care was necessary to treat or prevent imminent or life-threatening deterioration.  Critical care was time spent personally by me (independent of midlevel providers or residents) on the following activities: development of treatment plan with patient and/or surrogate as well as nursing, discussions with  consultants, evaluation of patient's response to treatment, examination of patient, obtaining history from patient or surrogate, ordering and performing treatments and interventions, ordering and review of laboratory studies, ordering and review of radiographic studies, pulse oximetry and re-evaluation of patient's condition.    Length of Stay: 16  Brynlynn Walko, MD  02/25/2019, 9:03 AM  Advanced Heart Failure Team Pager [phone] (M-F; 7a - 4p)  Please contact [facility] for night-coverage after hours (4p -7a ) and weekends on [email or web address]  

## 2019-02-26 ENCOUNTER — Inpatient Hospital Stay (HOSPITAL_COMMUNITY): Payer: Medicaid - Out of State

## 2019-02-26 ENCOUNTER — Inpatient Hospital Stay (HOSPITAL_COMMUNITY): Payer: Medicaid - Out of State | Admitting: Certified Registered Nurse Anesthetist

## 2019-02-26 ENCOUNTER — Encounter (HOSPITAL_COMMUNITY)
Admission: AD | Disposition: A | Discharge status: Home Health | Payer: Self-pay | Source: Other Acute Inpatient Hospital | Attending: Internal Medicine

## 2019-02-26 DIAGNOSIS — I428 Other cardiomyopathies: Secondary | ICD-10-CM

## 2019-02-26 HISTORY — PX: VIDEO BRONCHOSCOPY: SHX5072

## 2019-02-26 HISTORY — PX: INSERTION OF IMPLANTABLE LEFT VENTRICULAR ASSIST DEVICE: SHX5866

## 2019-02-26 HISTORY — PX: TEE WITHOUT CARDIOVERSION: SHX5443

## 2019-02-26 LAB — CBC
HCT: 31.2 % — ABNORMAL LOW (ref 39.0–52.0)
HCT: 29 % — ABNORMAL LOW (ref 39.0–52.0)
HCT: 24.4 % — ABNORMAL LOW (ref 39.0–52.0)
HCT: 26.2 % — ABNORMAL LOW (ref 39.0–52.0)
HCT: 28.1 % — ABNORMAL LOW (ref 39.0–52.0)
Hemoglobin: 10.1 g/dL — ABNORMAL LOW (ref 13.0–17.0)
Hemoglobin: 9.7 g/dL — ABNORMAL LOW (ref 13.0–17.0)
Hemoglobin: 8.2 g/dL — ABNORMAL LOW (ref 13.0–17.0)
Hemoglobin: 8.7 g/dL — ABNORMAL LOW (ref 13.0–17.0)
Hemoglobin: 9.6 g/dL — ABNORMAL LOW (ref 13.0–17.0)
MCH: 32.1 pg (ref 26.0–34.0)
MCH: 32.1 pg (ref 26.0–34.0)
MCH: 32.3 pg (ref 26.0–34.0)
MCH: 31.4 pg (ref 26.0–34.0)
MCH: 32 pg (ref 26.0–34.0)
MCHC: 32.4 g/dL (ref 30.0–36.0)
MCHC: 33.4 g/dL (ref 30.0–36.0)
MCHC: 33.6 g/dL (ref 30.0–36.0)
MCHC: 33.2 g/dL (ref 30.0–36.0)
MCHC: 34.2 g/dL (ref 30.0–36.0)
MCV: 99 fL (ref 80.0–100.0)
MCV: 96 fL (ref 80.0–100.0)
MCV: 96.1 fL (ref 80.0–100.0)
MCV: 94.6 fL (ref 80.0–100.0)
MCV: 93.7 fL (ref 80.0–100.0)
Platelets: 326 10*3/uL (ref 150–400)
Platelets: 248 10*3/uL (ref 150–400)
Platelets: 195 10*3/uL (ref 150–400)
Platelets: 178 10*3/uL (ref 150–400)
Platelets: 185 10*3/uL (ref 150–400)
RBC: 3.15 MIL/uL — ABNORMAL LOW (ref 4.22–5.81)
RBC: 3.02 MIL/uL — ABNORMAL LOW (ref 4.22–5.81)
RBC: 2.54 MIL/uL — ABNORMAL LOW (ref 4.22–5.81)
RBC: 2.77 MIL/uL — ABNORMAL LOW (ref 4.22–5.81)
RBC: 3 MIL/uL — ABNORMAL LOW (ref 4.22–5.81)
RDW: 14.8 % (ref 11.5–15.5)
RDW: 15.5 % (ref 11.5–15.5)
RDW: 15.7 % — ABNORMAL HIGH (ref 11.5–15.5)
RDW: 15.6 % — ABNORMAL HIGH (ref 11.5–15.5)
RDW: 15.8 % — ABNORMAL HIGH (ref 11.5–15.5)
WBC: 7 10*3/uL (ref 4.0–10.5)
WBC: 12.9 10*3/uL — ABNORMAL HIGH (ref 4.0–10.5)
WBC: 11 10*3/uL — ABNORMAL HIGH (ref 4.0–10.5)
WBC: 10.7 10*3/uL — ABNORMAL HIGH (ref 4.0–10.5)
WBC: 11.2 10*3/uL — ABNORMAL HIGH (ref 4.0–10.5)
nRBC: 0 % (ref 0.0–0.2)
nRBC: 0 % (ref 0.0–0.2)
nRBC: 0 % (ref 0.0–0.2)
nRBC: 0 % (ref 0.0–0.2)
nRBC: 0 % (ref 0.0–0.2)

## 2019-02-26 LAB — POCT I-STAT 7, (LYTES, BLD GAS, ICA,H+H)
Acid-Base Excess: 2 mmol/L (ref 0.0–2.0)
Acid-Base Excess: 1 mmol/L (ref 0.0–2.0)
Bicarbonate: 28.8 mmol/L — ABNORMAL HIGH (ref 20.0–28.0)
Bicarbonate: 25.6 mmol/L (ref 20.0–28.0)
Bicarbonate: 23.8 mmol/L (ref 20.0–28.0)
Calcium, Ion: 1.12 mmol/L — ABNORMAL LOW (ref 1.15–1.40)
Calcium, Ion: 1.08 mmol/L — ABNORMAL LOW (ref 1.15–1.40)
Calcium, Ion: 1.12 mmol/L — ABNORMAL LOW (ref 1.15–1.40)
HCT: 32 % — ABNORMAL LOW (ref 39.0–52.0)
HCT: 26 % — ABNORMAL LOW (ref 39.0–52.0)
HCT: 29 % — ABNORMAL LOW (ref 39.0–52.0)
Hemoglobin: 10.9 g/dL — ABNORMAL LOW (ref 13.0–17.0)
Hemoglobin: 8.8 g/dL — ABNORMAL LOW (ref 13.0–17.0)
Hemoglobin: 9.9 g/dL — ABNORMAL LOW (ref 13.0–17.0)
O2 Saturation: 95 %
O2 Saturation: 93 %
O2 Saturation: 97 %
Patient temperature: 38
Patient temperature: 37
Patient temperature: 36.8
Potassium: 3 mmol/L — ABNORMAL LOW (ref 3.5–5.1)
Potassium: 3.4 mmol/L — ABNORMAL LOW (ref 3.5–5.1)
Potassium: 3.1 mmol/L — ABNORMAL LOW (ref 3.5–5.1)
Sodium: 140 mmol/L (ref 135–145)
Sodium: 140 mmol/L (ref 135–145)
Sodium: 139 mmol/L (ref 135–145)
TCO2: 30 mmol/L (ref 22–32)
TCO2: 27 mmol/L (ref 22–32)
TCO2: 25 mmol/L (ref 22–32)
pCO2 arterial: 57.4 mmHg — ABNORMAL HIGH (ref 32.0–48.0)
pCO2 arterial: 40.7 mmHg (ref 32.0–48.0)
pCO2 arterial: 36 mmHg (ref 32.0–48.0)
pH, Arterial: 7.313 — ABNORMAL LOW (ref 7.350–7.450)
pH, Arterial: 7.406 (ref 7.350–7.450)
pH, Arterial: 7.428 (ref 7.350–7.450)
pO2, Arterial: 87 mmHg (ref 83.0–108.0)
pO2, Arterial: 68 mmHg — ABNORMAL LOW (ref 83.0–108.0)
pO2, Arterial: 85 mmHg (ref 83.0–108.0)

## 2019-02-26 LAB — BASIC METABOLIC PANEL
Anion gap: 10 (ref 5–15)
Anion gap: 11 (ref 5–15)
Anion gap: 9 (ref 5–15)
BUN: 21 mg/dL — ABNORMAL HIGH (ref 6–20)
BUN: 16 mg/dL (ref 6–20)
BUN: 15 mg/dL (ref 6–20)
CO2: 28 mmol/L (ref 22–32)
CO2: 26 mmol/L (ref 22–32)
CO2: 23 mmol/L (ref 22–32)
Calcium: 9.6 mg/dL (ref 8.9–10.3)
Calcium: 7.8 mg/dL — ABNORMAL LOW (ref 8.9–10.3)
Calcium: 7.9 mg/dL — ABNORMAL LOW (ref 8.9–10.3)
Chloride: 98 mmol/L (ref 98–111)
Chloride: 101 mmol/L (ref 98–111)
Chloride: 101 mmol/L (ref 98–111)
Creatinine, Ser: 0.74 mg/dL (ref 0.61–1.24)
Creatinine, Ser: 0.65 mg/dL (ref 0.61–1.24)
Creatinine, Ser: 0.72 mg/dL (ref 0.61–1.24)
GFR calc Af Amer: >60 mL/min (ref >60)
GFR calc Af Amer: >60 mL/min (ref >60)
GFR calc Af Amer: >60 mL/min (ref >60)
GFR calc non Af Amer: >60 mL/min (ref >60)
GFR calc non Af Amer: >60 mL/min (ref >60)
GFR calc non Af Amer: >60 mL/min (ref >60)
Glucose, Bld: 109 mg/dL — ABNORMAL HIGH (ref 70–99)
Glucose, Bld: 121 mg/dL — ABNORMAL HIGH (ref 70–99)
Glucose, Bld: 135 mg/dL — ABNORMAL HIGH (ref 70–99)
Potassium: 3.7 mmol/L (ref 3.5–5.1)
Potassium: 3 mmol/L — ABNORMAL LOW (ref 3.5–5.1)
Potassium: 3 mmol/L — ABNORMAL LOW (ref 3.5–5.1)
Sodium: 136 mmol/L (ref 135–145)
Sodium: 138 mmol/L (ref 135–145)
Sodium: 133 mmol/L — ABNORMAL LOW (ref 135–145)

## 2019-02-26 LAB — GLUCOSE, CAPILLARY
Glucose-Capillary: 122 mg/dL — ABNORMAL HIGH (ref 70–99)
Glucose-Capillary: 123 mg/dL — ABNORMAL HIGH (ref 70–99)
Glucose-Capillary: 132 mg/dL — ABNORMAL HIGH (ref 70–99)
Glucose-Capillary: 134 mg/dL — ABNORMAL HIGH (ref 70–99)
Glucose-Capillary: 135 mg/dL — ABNORMAL HIGH (ref 70–99)
Glucose-Capillary: 139 mg/dL — ABNORMAL HIGH (ref 70–99)
Glucose-Capillary: 140 mg/dL — ABNORMAL HIGH (ref 70–99)
Glucose-Capillary: 145 mg/dL — ABNORMAL HIGH (ref 70–99)
Glucose-Capillary: 158 mg/dL — ABNORMAL HIGH (ref 70–99)
Glucose-Capillary: 160 mg/dL — ABNORMAL HIGH (ref 70–99)

## 2019-02-26 LAB — DIC (DISSEMINATED INTRAVASCULAR COAGULATION)PANEL
D-Dimer, Quant: 1.74 ug/mL-FEU — ABNORMAL HIGH (ref 0.00–0.50)
Fibrinogen: 422 mg/dL (ref 210–475)
INR: 1.3 — ABNORMAL HIGH (ref 0.8–1.2)
Platelets: 248 10*3/uL (ref 150–400)
Prothrombin Time: 16.1 seconds — ABNORMAL HIGH (ref 11.4–15.2)
Smear Review: NONE SEEN
aPTT: 28 seconds (ref 24–36)

## 2019-02-26 LAB — COOXEMETRY PANEL
Carboxyhemoglobin: 0.9 % (ref 0.5–1.5)
Carboxyhemoglobin: 1.4 % (ref 0.5–1.5)
Carboxyhemoglobin: 1.3 % (ref 0.5–1.5)
Methemoglobin: 0.3 % (ref 0.0–1.5)
Methemoglobin: 1.5 % (ref 0.0–1.5)
Methemoglobin: 1.1 % (ref 0.0–1.5)
O2 Saturation: 55.6 %
O2 Saturation: 57.8 %
O2 Saturation: 50.5 %
Total hemoglobin: 9.6 g/dL — ABNORMAL LOW (ref 12.0–16.0)
Total hemoglobin: 9.3 g/dL — ABNORMAL LOW (ref 12.0–16.0)
Total hemoglobin: 7.9 g/dL — ABNORMAL LOW (ref 12.0–16.0)

## 2019-02-26 LAB — ECHO INTRAOPERATIVE TEE
Height: 68 in
Weight: 1481.49 oz

## 2019-02-26 LAB — MAGNESIUM
Magnesium: 1.8 mg/dL (ref 1.7–2.4)
Magnesium: 2.6 mg/dL — ABNORMAL HIGH (ref 1.7–2.4)

## 2019-02-26 LAB — HEMOGLOBIN AND HEMATOCRIT, BLOOD
HCT: 21.6 % — ABNORMAL LOW (ref 39.0–52.0)
Hemoglobin: 7.3 g/dL — ABNORMAL LOW (ref 13.0–17.0)

## 2019-02-26 LAB — PLATELET COUNT: Platelets: 196 10*3/uL (ref 150–400)

## 2019-02-26 LAB — PREPARE RBC (CROSSMATCH)

## 2019-02-26 SURGERY — INSERTION OF IMPLANTABLE LEFT VENTRICULAR ASSIST DEVICE
Anesthesia: General | Site: Chest

## 2019-02-26 MED ORDER — SODIUM CHLORIDE 0.9 % IV SOLN
INTRAVENOUS | Status: DC | PRN
  Administered 2019-02-26: 500 mL

## 2019-02-26 MED ORDER — SODIUM CHLORIDE 0.9% FLUSH: 3.0000 mL | INTRAVENOUS | Status: DC | PRN

## 2019-02-26 MED ORDER — VANCOMYCIN HCL 1000 MG IV SOLR
INTRAVENOUS | Status: AC
  Filled 2019-02-26: qty 1000

## 2019-02-26 MED ORDER — VANCOMYCIN HCL 500 MG/100ML IV SOLN
500.0000 mg | Freq: Two times a day (BID) | INTRAVENOUS | Status: AC
  Administered 2019-02-26 – 2019-02-27 (×3): 500 mg via INTRAVENOUS
  Filled 2019-02-26 (×4): qty 100

## 2019-02-26 MED ORDER — HEMOSTATIC AGENTS (NO CHARGE) OPTIME
TOPICAL | Status: DC | PRN
  Administered 2019-02-26: 1 via TOPICAL

## 2019-02-26 MED ORDER — BISACODYL 5 MG PO TBEC
10.0000 mg | DELAYED_RELEASE_TABLET | Freq: Every day | ORAL | Status: DC
  Administered 2019-02-27 – 2019-03-15 (×12): 10 mg via ORAL
  Filled 2019-02-26 (×13): qty 2

## 2019-02-26 MED ORDER — VANCOMYCIN HCL IN DEXTROSE 1-5 GM/200ML-% IV SOLN: 1000.0000 mg | Freq: Two times a day (BID) | INTRAVENOUS | Status: DC

## 2019-02-26 MED ORDER — MIDAZOLAM HCL (PF) 10 MG/2ML IJ SOLN
INTRAMUSCULAR | Status: AC
  Filled 2019-02-26: qty 2

## 2019-02-26 MED ORDER — FAMOTIDINE IN NACL 20-0.9 MG/50ML-% IV SOLN
20.0000 mg | Freq: Two times a day (BID) | INTRAVENOUS | Status: AC
  Administered 2019-02-26 (×2): 20 mg via INTRAVENOUS
  Filled 2019-02-26: qty 50

## 2019-02-26 MED ORDER — PROTAMINE SULFATE 10 MG/ML IV SOLN
INTRAVENOUS | Status: AC
  Filled 2019-02-26: qty 25

## 2019-02-26 MED ORDER — ACETAMINOPHEN 650 MG RE SUPP
650.0000 mg | Freq: Once | RECTAL | Status: AC
  Administered 2019-02-26: 650 mg via RECTAL

## 2019-02-26 MED ORDER — TRAMADOL HCL 50 MG PO TABS
50.0000 mg | ORAL_TABLET | ORAL | Status: DC | PRN
  Administered 2019-02-27 – 2019-03-03 (×3): 100 mg via ORAL
  Administered 2019-03-05 – 2019-03-07 (×4): 50 mg via ORAL
  Administered 2019-03-08: 100 mg via ORAL
  Administered 2019-03-10: 50 mg via ORAL
  Administered 2019-03-11: 100 mg via ORAL
  Filled 2019-02-26 (×2): qty 2
  Filled 2019-02-26 (×5): qty 1
  Filled 2019-02-26 (×3): qty 2

## 2019-02-26 MED ORDER — LACTATED RINGERS IV SOLN: 500.0000 mL | Freq: Once | INTRAVENOUS | Status: DC | PRN

## 2019-02-26 MED ORDER — LACTATED RINGERS IV SOLN: INTRAVENOUS | Status: DC | PRN

## 2019-02-26 MED ORDER — SODIUM CHLORIDE 0.9 % IV SOLN
20.0000 ug | Freq: Once | INTRAVENOUS | Status: AC
  Administered 2019-02-26: 20 ug via INTRAVENOUS
  Filled 2019-02-26: qty 5

## 2019-02-26 MED ORDER — SODIUM CHLORIDE 0.45 % IV SOLN: INTRAVENOUS | Status: DC | PRN

## 2019-02-26 MED ORDER — HEPARIN SODIUM (PORCINE) 1000 UNIT/ML IJ SOLN
INTRAMUSCULAR | Status: AC
  Filled 2019-02-26: qty 1

## 2019-02-26 MED ORDER — EPINEPHRINE PF 1 MG/ML IJ SOLN
2.0000 ug/min | INTRAVENOUS | Status: DC
  Administered 2019-02-26 – 2019-02-27 (×2): 4 ug/min via INTRAVENOUS
  Administered 2019-02-28: 2 ug/min via INTRAVENOUS
  Filled 2019-02-26 (×4): qty 4

## 2019-02-26 MED ORDER — DEXMEDETOMIDINE HCL IN NACL 400 MCG/100ML IV SOLN
0.0000 ug/kg/h | INTRAVENOUS | Status: DC
  Administered 2019-02-26: 0.7 ug/kg/h via INTRAVENOUS
  Administered 2019-02-27: 1 ug/kg/h via INTRAVENOUS
  Filled 2019-02-26 (×2): qty 100

## 2019-02-26 MED ORDER — MORPHINE SULFATE (PF) 2 MG/ML IV SOLN
1.0000 mg | INTRAVENOUS | Status: DC | PRN
  Administered 2019-02-26: 2 mg via INTRAVENOUS
  Administered 2019-02-26: 4 mg via INTRAVENOUS
  Administered 2019-02-26 (×2): 2 mg via INTRAVENOUS
  Administered 2019-02-26 – 2019-02-27 (×8): 4 mg via INTRAVENOUS
  Filled 2019-02-26 (×2): qty 2
  Filled 2019-02-26 (×3): qty 1
  Filled 2019-02-26 (×7): qty 2

## 2019-02-26 MED ORDER — PROTAMINE SULFATE 10 MG/ML IV SOLN
INTRAVENOUS | Status: DC | PRN
  Administered 2019-02-26: 50 mg via INTRAVENOUS
  Administered 2019-02-26: 200 mg via INTRAVENOUS

## 2019-02-26 MED ORDER — CHLORHEXIDINE GLUCONATE 0.12% ORAL RINSE (MEDLINE KIT)
15.0000 mL | Freq: Two times a day (BID) | OROMUCOSAL | Status: DC
  Administered 2019-02-26 – 2019-02-27 (×2): 15 mL via OROMUCOSAL

## 2019-02-26 MED ORDER — SODIUM CHLORIDE (PF) 0.9 % IJ SOLN
OROMUCOSAL | Status: DC | PRN
  Administered 2019-02-26 (×3): 4 mL via TOPICAL

## 2019-02-26 MED ORDER — SODIUM CHLORIDE 0.9 % IV SOLN: INTRAVENOUS | Status: DC

## 2019-02-26 MED ORDER — DOCUSATE SODIUM 100 MG PO CAPS
200.0000 mg | ORAL_CAPSULE | Freq: Every day | ORAL | Status: DC
  Administered 2019-02-28 – 2019-03-15 (×13): 200 mg via ORAL
  Filled 2019-02-26 (×13): qty 2

## 2019-02-26 MED ORDER — 0.9 % SODIUM CHLORIDE (POUR BTL) OPTIME
TOPICAL | Status: DC | PRN
  Administered 2019-02-26: 5000 mL

## 2019-02-26 MED ORDER — SODIUM CHLORIDE 0.9 % IV SOLN
1.5000 g | Freq: Two times a day (BID) | INTRAVENOUS | Status: DC
  Administered 2019-02-26 – 2019-02-28 (×4): 1.5 g via INTRAVENOUS
  Filled 2019-02-26 (×6): qty 1.5

## 2019-02-26 MED ORDER — ACETAMINOPHEN 160 MG/5ML PO SOLN
1000.0000 mg | Freq: Four times a day (QID) | ORAL | Status: AC
  Administered 2019-02-26 – 2019-02-27 (×3): 1000 mg
  Filled 2019-02-26 (×3): qty 40.6

## 2019-02-26 MED ORDER — LEVALBUTEROL HCL 1.25 MG/0.5ML IN NEBU
1.2500 mg | INHALATION_SOLUTION | Freq: Four times a day (QID) | RESPIRATORY_TRACT | Status: DC
  Administered 2019-02-26 – 2019-02-27 (×5): 1.25 mg via RESPIRATORY_TRACT
  Filled 2019-02-26 (×5): qty 0.5

## 2019-02-26 MED ORDER — NITROGLYCERIN IN D5W 200-5 MCG/ML-% IV SOLN
0.0000 ug/min | INTRAVENOUS | Status: DC
  Administered 2019-02-26: 10 ug/min via INTRAVENOUS
  Administered 2019-02-27: 35 ug/min via INTRAVENOUS
  Administered 2019-02-28: 150 ug/min via INTRAVENOUS
  Administered 2019-02-28: 60 ug/min via INTRAVENOUS
  Administered 2019-02-28: 150 ug/min via INTRAVENOUS
  Administered 2019-03-01: 40 ug/min via INTRAVENOUS
  Administered 2019-03-01: 50 ug/min via INTRAVENOUS
  Filled 2019-02-26 (×6): qty 250

## 2019-02-26 MED ORDER — SODIUM CHLORIDE 0.9 % IV SOLN: INTRAVENOUS | Status: DC | PRN

## 2019-02-26 MED ORDER — SODIUM CHLORIDE 0.9 % IV SOLN: 250.0000 mL | INTRAVENOUS | Status: DC

## 2019-02-26 MED ORDER — PANTOPRAZOLE SODIUM 40 MG PO TBEC
40.0000 mg | DELAYED_RELEASE_TABLET | Freq: Every day | ORAL | Status: DC
  Administered 2019-02-28 – 2019-03-15 (×16): 40 mg via ORAL
  Filled 2019-02-26 (×16): qty 1

## 2019-02-26 MED ORDER — FENTANYL CITRATE (PF) 250 MCG/5ML IJ SOLN
INTRAMUSCULAR | Status: AC
  Filled 2019-02-26: qty 25

## 2019-02-26 MED ORDER — SODIUM CHLORIDE 0.9% FLUSH
3.0000 mL | Freq: Two times a day (BID) | INTRAVENOUS | Status: DC
  Administered 2019-02-28 – 2019-03-01 (×3): 3 mL via INTRAVENOUS

## 2019-02-26 MED ORDER — FENTANYL CITRATE (PF) 100 MCG/2ML IJ SOLN
INTRAMUSCULAR | Status: DC | PRN
  Administered 2019-02-26 (×2): 100 ug via INTRAVENOUS
  Administered 2019-02-26: 50 ug via INTRAVENOUS
  Administered 2019-02-26 (×5): 100 ug via INTRAVENOUS
  Administered 2019-02-26: 150 ug via INTRAVENOUS

## 2019-02-26 MED ORDER — ETOMIDATE 2 MG/ML IV SOLN
INTRAVENOUS | Status: DC | PRN
  Administered 2019-02-26: 18 mg via INTRAVENOUS

## 2019-02-26 MED ORDER — CHLORHEXIDINE GLUCONATE CLOTH 2 % EX PADS
6.0000 | MEDICATED_PAD | Freq: Every day | CUTANEOUS | Status: DC
  Administered 2019-02-26 – 2019-03-14 (×16): 6 via TOPICAL

## 2019-02-26 MED ORDER — MIDAZOLAM HCL 5 MG/5ML IJ SOLN
INTRAMUSCULAR | Status: DC | PRN
  Administered 2019-02-26 (×2): 3 mg via INTRAVENOUS
  Administered 2019-02-26 (×2): 2 mg via INTRAVENOUS

## 2019-02-26 MED ORDER — BISACODYL 10 MG RE SUPP: 10.0000 mg | Freq: Every day | RECTAL | Status: DC

## 2019-02-26 MED ORDER — ASPIRIN EC 325 MG PO TBEC
325.0000 mg | DELAYED_RELEASE_TABLET | Freq: Every day | ORAL | Status: DC
  Administered 2019-02-28 – 2019-03-04 (×5): 325 mg via ORAL
  Filled 2019-02-26 (×5): qty 1

## 2019-02-26 MED ORDER — HYDRALAZINE HCL 20 MG/ML IJ SOLN
10.0000 mg | INTRAMUSCULAR | Status: DC | PRN
  Administered 2019-02-27 – 2019-03-01 (×11): 10 mg via INTRAVENOUS
  Filled 2019-02-26 (×11): qty 1

## 2019-02-26 MED ORDER — NOREPINEPHRINE 4 MG/250ML-% IV SOLN: 0.0000 ug/min | INTRAVENOUS | Status: DC

## 2019-02-26 MED ORDER — METOCLOPRAMIDE HCL 5 MG/ML IJ SOLN
10.0000 mg | Freq: Four times a day (QID) | INTRAMUSCULAR | Status: DC
  Administered 2019-02-26 – 2019-02-28 (×7): 10 mg via INTRAVENOUS
  Filled 2019-02-26 (×6): qty 2

## 2019-02-26 MED ORDER — POTASSIUM CHLORIDE 10 MEQ/50ML IV SOLN
10.0000 meq | INTRAVENOUS | Status: AC
  Administered 2019-02-26 – 2019-02-27 (×3): 10 meq via INTRAVENOUS
  Filled 2019-02-26 (×3): qty 50

## 2019-02-26 MED ORDER — HEPARIN SODIUM (PORCINE) 1000 UNIT/ML IJ SOLN
INTRAMUSCULAR | Status: DC | PRN
  Administered 2019-02-26: 15000 [IU] via INTRAVENOUS
  Administered 2019-02-26: 10000 [IU] via INTRAVENOUS

## 2019-02-26 MED ORDER — ASPIRIN 81 MG PO CHEW
324.0000 mg | CHEWABLE_TABLET | Freq: Every day | ORAL | Status: DC
  Administered 2019-02-27: 324 mg
  Filled 2019-02-26 (×2): qty 4

## 2019-02-26 MED ORDER — SODIUM CHLORIDE 0.9 % IR SOLN
Status: DC | PRN
  Administered 2019-02-26 (×2): 1000 mL

## 2019-02-26 MED ORDER — VANCOMYCIN HCL 1000 MG IV SOLR
INTRAVENOUS | Status: DC | PRN
  Administered 2019-02-26 (×2): 1000 mg

## 2019-02-26 MED ORDER — ACETAMINOPHEN 500 MG PO TABS
1000.0000 mg | ORAL_TABLET | Freq: Four times a day (QID) | ORAL | Status: AC
  Administered 2019-02-27 – 2019-03-03 (×13): 1000 mg via ORAL
  Filled 2019-02-26 (×15): qty 2

## 2019-02-26 MED ORDER — POTASSIUM CHLORIDE 10 MEQ/50ML IV SOLN
10.0000 meq | INTRAVENOUS | Status: AC
  Administered 2019-02-26 (×3): 10 meq via INTRAVENOUS

## 2019-02-26 MED ORDER — FLUCONAZOLE IN SODIUM CHLORIDE 400-0.9 MG/200ML-% IV SOLN
400.0000 mg | Freq: Once | INTRAVENOUS | Status: AC
  Administered 2019-02-27: 400 mg via INTRAVENOUS
  Filled 2019-02-26: qty 200

## 2019-02-26 MED ORDER — DEXTROSE 50 % IV SOLN: 0.0000 mL | INTRAVENOUS | Status: DC | PRN

## 2019-02-26 MED ORDER — CHLORHEXIDINE GLUCONATE 0.12 % MT SOLN
15.0000 mL | OROMUCOSAL | Status: AC
  Administered 2019-02-26: 15 mL via OROMUCOSAL

## 2019-02-26 MED ORDER — LACTATED RINGERS IV SOLN: INTRAVENOUS | Status: DC

## 2019-02-26 MED ORDER — ARTIFICIAL TEARS OPHTHALMIC OINT
TOPICAL_OINTMENT | OPHTHALMIC | Status: DC | PRN
  Administered 2019-02-26: 1 via OPHTHALMIC

## 2019-02-26 MED ORDER — RIFAMPIN 300 MG PO CAPS
600.0000 mg | ORAL_CAPSULE | Freq: Once | ORAL | Status: DC
  Filled 2019-02-26: qty 2

## 2019-02-26 MED ORDER — MILRINONE LACTATE IN DEXTROSE 20-5 MG/100ML-% IV SOLN
0.3750 ug/kg/min | INTRAVENOUS | Status: DC
  Administered 2019-02-27 – 2019-02-28 (×4): 0.375 ug/kg/min via INTRAVENOUS
  Filled 2019-02-26 (×3): qty 100

## 2019-02-26 MED ORDER — ROCURONIUM BROMIDE 100 MG/10ML IV SOLN
INTRAVENOUS | Status: DC | PRN
  Administered 2019-02-26: 100 mg via INTRAVENOUS
  Administered 2019-02-26: 50 mg via INTRAVENOUS
  Administered 2019-02-26: 100 mg via INTRAVENOUS

## 2019-02-26 MED ORDER — PROPOFOL 10 MG/ML IV BOLUS
INTRAVENOUS | Status: AC
  Filled 2019-02-26: qty 20

## 2019-02-26 MED ORDER — MIDAZOLAM HCL 2 MG/2ML IJ SOLN
2.0000 mg | INTRAMUSCULAR | Status: DC | PRN
  Administered 2019-02-26 – 2019-02-27 (×8): 2 mg via INTRAVENOUS
  Filled 2019-02-26 (×7): qty 2

## 2019-02-26 MED ORDER — ASPIRIN 300 MG RE SUPP: 300.0000 mg | Freq: Every day | RECTAL | Status: DC

## 2019-02-26 MED ORDER — ORAL CARE MOUTH RINSE
15.0000 mL | OROMUCOSAL | Status: DC
  Administered 2019-02-26 – 2019-02-27 (×11): 15 mL via OROMUCOSAL

## 2019-02-26 MED ORDER — INSULIN REGULAR(HUMAN) IN NACL 100-0.9 UT/100ML-% IV SOLN: INTRAVENOUS | Status: DC

## 2019-02-26 MED ORDER — ALBUMIN HUMAN 5 % IV SOLN
250.0000 mL | INTRAVENOUS | Status: DC | PRN
  Administered 2019-02-26 (×2): 12.5 g via INTRAVENOUS
  Filled 2019-02-26: qty 500

## 2019-02-26 MED ORDER — ONDANSETRON HCL 4 MG/2ML IJ SOLN
4.0000 mg | Freq: Four times a day (QID) | INTRAMUSCULAR | Status: DC | PRN
  Administered 2019-02-28 – 2019-03-01 (×3): 4 mg via INTRAVENOUS
  Filled 2019-02-26 (×2): qty 2

## 2019-02-26 MED ORDER — MAGNESIUM SULFATE 4 GM/100ML IV SOLN
4.0000 g | Freq: Once | INTRAVENOUS | Status: AC
  Administered 2019-02-26: 4 g via INTRAVENOUS
  Filled 2019-02-26: qty 100

## 2019-02-26 MED ORDER — ACETAMINOPHEN 160 MG/5ML PO SOLN: 650.0000 mg | Freq: Once | ORAL | Status: AC

## 2019-02-26 MED ORDER — SODIUM CHLORIDE 0.9 % IV SOLN
INTRAVENOUS | Status: AC
  Filled 2019-02-26: qty 1.2

## 2019-02-26 SURGICAL SUPPLY — 132 items
ADAPTER CARDIO PERF ANTE/RETRO (ADAPTER) IMPLANT
ADAPTER DLP PERFUSION .25INX2I (MISCELLANEOUS) ×4 IMPLANT
ANTEGRADE CPLG (MISCELLANEOUS) IMPLANT
BAG DECANTER FOR FLEXI CONT (MISCELLANEOUS) ×12 IMPLANT
BLADE CLIPPER SURG (BLADE) ×4 IMPLANT
BLADE STERNUM SYSTEM 6 (BLADE) ×4 IMPLANT
BLADE SURG 12 STRL SS (BLADE) ×4 IMPLANT
BLADE SURG 15 STRL LF DISP TIS (BLADE) IMPLANT
BLADE SURG 15 STRL SS (BLADE)
CANISTER SUCT 3000ML PPV (MISCELLANEOUS) ×4 IMPLANT
CANNULA ARTERIAL NVNT 3/8 20FR (MISCELLANEOUS) ×4 IMPLANT
CANNULA SUMP PERICARDIAL (CANNULA) ×4 IMPLANT
CANNULA VENOUS LOW PROF 34X46 (CANNULA) ×4 IMPLANT
CATH FOLEY 2WAY SLVR  5CC 14FR (CATHETERS)
CATH FOLEY 2WAY SLVR 18FR 30CC (CATHETERS) ×4 IMPLANT
CATH FOLEY 2WAY SLVR 5CC 14FR (CATHETERS) IMPLANT
CATH HEART VENT LEFT (CATHETERS) IMPLANT
CATH ROBINSON RED A/P 18FR (CATHETERS) ×12 IMPLANT
CATH THORACIC 28FR (CATHETERS) IMPLANT
CATH THORACIC 28FR RT ANG (CATHETERS) ×8 IMPLANT
CATH THORACIC 36FR RT ANG (CATHETERS) IMPLANT
CHLORAPREP W/TINT 26 (MISCELLANEOUS) IMPLANT
CONN ST 1/4X3/8  BEN (MISCELLANEOUS)
CONN ST 1/4X3/8 BEN (MISCELLANEOUS) IMPLANT
CONN Y 3/8X3/8X3/8  BEN (MISCELLANEOUS) ×2
CONN Y 3/8X3/8X3/8 BEN (MISCELLANEOUS) ×2 IMPLANT
CONT SPEC 4OZ CLIKSEAL STRL BL (MISCELLANEOUS) ×4 IMPLANT
DRAIN CHANNEL 28F RND 3/8 FF (WOUND CARE) IMPLANT
DRAIN CHANNEL 32F RND 10.7 FF (WOUND CARE) IMPLANT
DRAIN CONNECTOR BLAKE 1:1 (MISCELLANEOUS) ×4 IMPLANT
DRAPE CV SPLIT W-CLR ANES SCRN (DRAPES) ×4 IMPLANT
DRAPE INCISE IOBAN 66X45 STRL (DRAPES) ×4 IMPLANT
DRAPE PERI GROIN 82X75IN TIB (DRAPES) ×4 IMPLANT
DRAPE SLUSH/WARMER DISC (DRAPES) ×4 IMPLANT
DRSG AQUACEL AG ADV 3.5X14 (GAUZE/BANDAGES/DRESSINGS) ×4 IMPLANT
ELECT BLADE 4.0 EZ CLEAN MEGAD (MISCELLANEOUS) ×4
ELECT BLADE 6.5 EXT (BLADE) ×4 IMPLANT
ELECT CAUTERY BLADE 6.4 (BLADE) ×4 IMPLANT
ELECT REM PT RETURN 9FT ADLT (ELECTROSURGICAL) ×8
ELECTRODE BLDE 4.0 EZ CLN MEGD (MISCELLANEOUS) ×2 IMPLANT
ELECTRODE REM PT RTRN 9FT ADLT (ELECTROSURGICAL) ×4 IMPLANT
FELT TEFLON 6X6 (MISCELLANEOUS) IMPLANT
GAUZE SPONGE 4X4 12PLY STRL (GAUZE/BANDAGES/DRESSINGS) ×4 IMPLANT
GAUZE SPONGE 4X4 12PLY STRL LF (GAUZE/BANDAGES/DRESSINGS) ×4 IMPLANT
GLOVE BIO SURGEON STRL SZ 6.5 (GLOVE) ×3 IMPLANT
GLOVE BIO SURGEON STRL SZ7.5 (GLOVE) ×20 IMPLANT
GLOVE BIO SURGEONS STRL SZ 6.5 (GLOVE) ×1
GLOVE BIOGEL PI IND STRL 6 (GLOVE) ×2 IMPLANT
GLOVE BIOGEL PI IND STRL 6.5 (GLOVE) ×2 IMPLANT
GLOVE BIOGEL PI IND STRL 7.0 (GLOVE) ×6 IMPLANT
GLOVE BIOGEL PI INDICATOR 6 (GLOVE) ×2
GLOVE BIOGEL PI INDICATOR 6.5 (GLOVE) ×2
GLOVE BIOGEL PI INDICATOR 7.0 (GLOVE) ×6
GLOVE SS BIOGEL STRL SZ 7 (GLOVE) ×2 IMPLANT
GLOVE SS N UNI LF 6.0 STRL (GLOVE) ×4 IMPLANT
GLOVE SUPERSENSE BIOGEL SZ 7 (GLOVE) ×2
GOWN STRL REUS W/ TWL LRG LVL3 (GOWN DISPOSABLE) ×16 IMPLANT
GOWN STRL REUS W/ TWL XL LVL3 (GOWN DISPOSABLE) ×6 IMPLANT
GOWN STRL REUS W/TWL LRG LVL3 (GOWN DISPOSABLE) ×16
GOWN STRL REUS W/TWL XL LVL3 (GOWN DISPOSABLE) ×6
HEMOSTAT POWDER SURGIFOAM 1G (HEMOSTASIS) ×12 IMPLANT
HEMOSTAT SURGICEL 2X14 (HEMOSTASIS) ×4 IMPLANT
INSERT FOGARTY XLG (MISCELLANEOUS) IMPLANT
KIT BASIN OR (CUSTOM PROCEDURE TRAY) ×4 IMPLANT
KIT LVAD HEARTMATE 3 W-CNTRL (Prosthesis & Implant Heart) ×3 IMPLANT
KIT LVAD HEARTMATE III W-CNTRL (Prosthesis & Implant Heart) ×1 IMPLANT
KIT SUCTION CATH 14FR (SUCTIONS) ×4 IMPLANT
KIT TURNOVER KIT B (KITS) ×4 IMPLANT
LEAD PACING MYOCARDI (MISCELLANEOUS) ×8 IMPLANT
LINE VENT (MISCELLANEOUS) ×4 IMPLANT
MICROMATRIX 1000MG (Tissue) ×4 IMPLANT
MICROMATRIX 500MG (Tissue) ×4 IMPLANT
NEEDLE 18GX1X1/2 (RX/OR ONLY) (NEEDLE) ×8 IMPLANT
NS IRRIG 1000ML POUR BTL (IV SOLUTION) ×20 IMPLANT
PACK OPEN HEART (CUSTOM PROCEDURE TRAY) ×4 IMPLANT
PAD ARMBOARD 7.5X6 YLW CONV (MISCELLANEOUS) ×8 IMPLANT
PAD DEFIB R2 (MISCELLANEOUS) ×4 IMPLANT
POSITIONER HEAD DONUT 9IN (MISCELLANEOUS) ×4 IMPLANT
PUNCH AORTIC ROTATE  4.5MM 8IN (MISCELLANEOUS) ×4 IMPLANT
PUNCH AORTIC ROTATE 4.5MM 8IN (MISCELLANEOUS) IMPLANT
REAMER C 12MM (INSTRUMENTS) ×4 IMPLANT
SEALANT SURG COSEAL 8ML (VASCULAR PRODUCTS) ×4 IMPLANT
SET CARDIOPLEGIA MPS 5001102 (MISCELLANEOUS) ×4 IMPLANT
SHEATH AVANTI 11CM 5FR (SHEATH) IMPLANT
SHEATH PINNACLE 5F 10CM (SHEATH) ×4 IMPLANT
SOLUTION PARTIC MCRMTRX 1000MG (Tissue) ×2 IMPLANT
SOLUTION PARTIC MCRMTRX 500MG (Tissue) ×2 IMPLANT
SPONGE LAP 18X18 X RAY DECT (DISPOSABLE) ×4 IMPLANT
SURGIFLO W/THROMBIN 8M KIT (HEMOSTASIS) ×4 IMPLANT
SUT ETHIBOND 2 0 SH (SUTURE) ×10
SUT ETHIBOND 2 0 SH 36X2 (SUTURE) ×10 IMPLANT
SUT ETHIBOND NAB MH 2-0 36IN (SUTURE) ×52 IMPLANT
SUT ETHILON 3 0 FSL (SUTURE) ×4 IMPLANT
SUT PROLENE 3 0 RB 1 (SUTURE) IMPLANT
SUT PROLENE 3 0 SH DA (SUTURE) ×8 IMPLANT
SUT PROLENE 4 0 RB 1 (SUTURE) ×8
SUT PROLENE 4-0 RB1 .5 CRCL 36 (SUTURE) ×8 IMPLANT
SUT PROLENE 5 0 C1 (SUTURE) ×8 IMPLANT
SUT PROLENE 6 0 C 1 30 (SUTURE) ×4 IMPLANT
SUT SILK  1 MH (SUTURE) ×12
SUT SILK 1 MH (SUTURE) ×12 IMPLANT
SUT SILK 1 TIES 10X30 (SUTURE) ×4 IMPLANT
SUT SILK 2 0 SH CR/8 (SUTURE) ×12 IMPLANT
SUT SILK 3 0 SH CR/8 (SUTURE) ×4 IMPLANT
SUT STEEL 6MS V (SUTURE) ×8 IMPLANT
SUT STEEL SZ 6 DBL 3X14 BALL (SUTURE) IMPLANT
SUT TEM PAC WIRE 2 0 SH (SUTURE) ×16 IMPLANT
SUT VIC AB 1 CTX 36 (SUTURE) ×4
SUT VIC AB 1 CTX36XBRD ANBCTR (SUTURE) ×4 IMPLANT
SUT VIC AB 2-0 CTX 27 (SUTURE) ×8 IMPLANT
SUT VIC AB 3-0 SH 8-18 (SUTURE) ×4 IMPLANT
SUT VIC AB 3-0 X1 27 (SUTURE) ×12 IMPLANT
SYR 30ML SLIP (SYRINGE) ×4 IMPLANT
SYR 50ML LL SCALE MARK (SYRINGE) ×8 IMPLANT
SYSTEM SAHARA CHEST DRAIN ATS (WOUND CARE) ×4 IMPLANT
TAPE CLOTH SURG 4X10 WHT LF (GAUZE/BANDAGES/DRESSINGS) ×8 IMPLANT
TAPE PAPER 2X10 WHT MICROPORE (GAUZE/BANDAGES/DRESSINGS) ×4 IMPLANT
TOWEL GREEN STERILE (TOWEL DISPOSABLE) ×4 IMPLANT
TRAP SPECIMEN MUCOUS 40CC (MISCELLANEOUS) ×4 IMPLANT
TRAY CATH LUMEN 1 20CM STRL (SET/KITS/TRAYS/PACK) IMPLANT
TRAY FOLEY SLVR 16FR TEMP STAT (SET/KITS/TRAYS/PACK) ×4 IMPLANT
TUBE CONNECTING 12'X1/4 (SUCTIONS) ×1
TUBE CONNECTING 12X1/4 (SUCTIONS) ×3 IMPLANT
TUBE CONNECTING 20'X1/4 (TUBING) ×2
TUBE CONNECTING 20X1/4 (TUBING) ×6 IMPLANT
TUBING MEDICAL 3X8X3X32 (MISCELLANEOUS) ×4 IMPLANT
TUBING PVC 1/4X1/16 WALL 8 (MISCELLANEOUS) ×3 IMPLANT
TUBING PVC 1/4X1/16 WALL 8' (MISCELLANEOUS) ×1
UNDERPAD 30X30 (UNDERPADS AND DIAPERS) ×4 IMPLANT
VENT LEFT HEART 12002 (CATHETERS)
WATER STERILE IRR 1000ML POUR (IV SOLUTION) ×8 IMPLANT
YANKAUER SUCT BULB TIP NO VENT (SUCTIONS) ×4 IMPLANT

## 2019-02-26 NOTE — Progress Notes (Signed)
  Echocardiogram Echocardiogram Transesophageal has been performed.  Jon Martin 02/26/2019, 9:00 AM

## 2019-02-26 NOTE — Progress Notes (Signed)
Pre Procedure note for inpatients:   Jon Martin has been scheduled for Procedure(s) with comments: INSERTION OF IMPLANTABLE LEFT VENTRICULAR ASSIST DEVICE (N/A) - HM3 TRANSESOPHAGEAL ECHOCARDIOGRAM (TEE) (N/A) today. The various methods of treatment have been discussed with the patient. After consideration of the risks, benefits and treatment options the patient has consented to the planned procedure.   The patient has been seen and labs reviewed. There are no changes in the patient's condition to prevent proceeding with the planned procedure today.  Recent labs:  Lab Results  Component Value Date   WBC 7.0 02/26/2019   HGB 10.1 (L) 02/26/2019   HCT 31.2 (L) 02/26/2019   PLT 326 02/26/2019   GLUCOSE 109 (H) 02/26/2019   CHOL 89 02/11/2019   TRIG 58 02/11/2019   HDL 25 (L) 02/11/2019   LDLCALC 52 02/11/2019   ALT 33 02/24/2019   AST 41 02/24/2019   NA 136 02/26/2019   K 3.7 02/26/2019   CL 98 02/26/2019   CREATININE 0.74 02/26/2019   BUN 21 (H) 02/26/2019   CO2 28 02/26/2019   TSH 3.260 02/12/2019   INR 1.1 02/24/2019   HGBA1C 5.2 02/11/2019    Mikey Bussing, MD 02/26/2019 7:22 AM

## 2019-02-26 NOTE — Anesthesia Postprocedure Evaluation (Signed)
Anesthesia Post Note  Patient: Jon Martin  Procedure(s) Performed: INSERTION OF IMPLANTABLE LEFT VENTRICULAR ASSIST DEVICE (N/A Chest) TRANSESOPHAGEAL ECHOCARDIOGRAM (TEE) (N/A ) Video Bronchoscopy (N/A )     Patient location during evaluation: SICU Anesthesia Type: General Level of consciousness: sedated Pain management: pain level controlled Vital Signs Assessment: post-procedure vital signs reviewed and stable Respiratory status: patient remains intubated per anesthesia plan Cardiovascular status: stable Postop Assessment: no apparent nausea or vomiting Anesthetic complications: no    Last Vitals:  Vitals:   02/26/19 1400 02/26/19 1500  BP: 100/79   Pulse:    Resp: 12 16  Temp: 37.9 C 37.6 C  SpO2:      Last Pain:  Vitals:   02/26/19 0400  TempSrc: Oral  PainSc:                  Rudene Poulsen DANIEL

## 2019-02-26 NOTE — Anesthesia Procedure Notes (Addendum)
Arterial Line Insertion Start/End1/22/2021 6:50 AM Performed by: Lanell Matar, CRNA, CRNA  Patient location: Pre-op. Preanesthetic checklist: patient identified, IV checked, site marked, risks and benefits discussed, surgical consent, monitors and equipment checked, pre-op evaluation, timeout performed and anesthesia consent Lidocaine 1% used for infiltration Left, radial was placed Catheter size: 20 Fr Hand hygiene performed  and maximum sterile barriers used   Attempts: 1 Procedure performed without using ultrasound guided technique. Following insertion, dressing applied and Biopatch. Post procedure assessment: normal and unchanged  Patient tolerated the procedure well with no immediate complications.

## 2019-02-26 NOTE — Progress Notes (Signed)
LVAD Coordinator Rounding Note:  Admitted 02/10/19 due to CHF from OSH .   HM3 LVAD implanted on 02/26/19 by PVT under DT criteria.  Vital signs: Temp: 99.7 HR: 116 Doppler Pressure: 102 Automatic BP: 100/79 (88) Art Line: 102/77 (89) O2 Sat: 100 on 50% FiO2 Wt:106.4 lbs    LVAD interrogation reveals:   Speed: 5500 Flow: 3 Power:  4 PI: 2.8 Alarms: none Events:  none Hematocrit: 27 Fixed speed: 5500 Low speed limit: 5200   Drive Line: CDI. Anchor secure. Daily by VAD coordinator or nurse champion. Due tomorrow 02/27/19.  Labs:  LDH trend: 243  INR trend: 1.3  Anticoagulation Plan: -INR Goal: 2-2.5 -ASA Dose: 325 mg  Blood Products:  -Intra op: 2 u PRBCS 6 FFP 1 PLT  Device: -N/A  Gtts: Precedex 0.82mcg/kg/hr Epi 4 mcg/min Milrinone 0.375 mcg/kg/min Insulin 0.3 u/hr   Respiratory: ON Vent 50 % FiO2 Nitric Oxide 20PPM  Renal:  -BUN/CRT: 16/0.65   Adverse Events on VAD: -  VAD Education: 1. Pt currently intubated/sedated. Education inappropriate at this time.   Plan/Recommendations:   1. Page VAD coordinator w/any equipment issues or concerns or driveline management questions. 2. [place] to change driveline dressing this weekend.   Carlton Adam RN, BSN VAD Coordinator 24/7 Pager [phone]

## 2019-02-26 NOTE — Progress Notes (Signed)
Patient ID: Jon Martin, male   DOB: December 22, 1985, 34 y.o.   MRN: [id number]     Advanced Heart Failure Rounding Note  PCP-Cardiologist: No primary care provider on file.   Subjective:    34 y.o. 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  Underwent HM-3 placement today (02/26/2019).  Now back from OR. Remains intubated and sedated. On milrinone 0.375, epi 4 and iNO  Having some bleeding from CTs. CVP 8-9   LVAD Interrogation HM 3: Speed: 5500 Flow: 2.9 PI: 4.8 Power: 3.7.   Objective:   Weight Range: 42 kg Body mass index is 14.08 kg/m.   Vital Signs:   Temp:  [98.3 F (36.8 C)-100.2 F (37.9 C)] 99.7 F (37.6 C) (01/22 1500) Pulse Rate:  [116-126] 126 (01/22 0600) Resp:  [12-31] 16 (01/22 1500) BP: (95-115)/(67-87) 100/79 (01/22 1400) SpO2:  [89 %-100 %] 100 % (01/22 1340) Arterial Line BP: (102-103)/(71-77) 103/71 (01/22 1500) FiO2 (%):  [50 %] 50 % (01/22 1340) Weight:  [42 kg] 42 kg (01/22 0500) Last BM Date: 02/25/19  Weight change: Filed Weights   02/24/19 0722 02/25/19 0500 02/26/19 0500  Weight: 48 kg 48.3 kg 42 kg    Intake/Output:   Intake/Output Summary (Last 24 hours) at 02/26/2019 1544 Last data filed at 02/26/2019 1500 Gross per 24 hour  Intake 4834.04 ml  Output 3665 ml  Net 1169.04 ml      Physical Exam   General:  Intubated sedated HEENT: normal + ETT Neck: supple. RIJ swan.  Carotids 2+ bilat; no bruits. No lymphadenopathy or thryomegaly appreciated. Cor: sternal dressing ok LVAD hum.  +CTs Lungs: Coarse. Abdomen:  soft, nontender, + distended. No hepatosplenomegaly. No bruits or masses. Quiet Driveline site clean. Anchor in place.  Extremities: no cyanosis, clubbing, rash. Warm 1+ edema  Neuro: intubated sedated  Telemetry  Sinus 95-105 + PVCss personally reviewed  Labs    CBC Recent Labs    02/26/19 0302 02/26/19 0302 02/26/19 1052 02/26/19 1052 02/26/19 1401 02/26/19 1411  WBC 7.0  --   --    --  12.9*  --   HGB 10.1*   < > 7.3*  --  9.7*  --   HCT 31.2*   < > 21.6*  --  29.0*  --   MCV 99.0  --   --   --  96.0  --   PLT 326   < > 196   < > 248 248   < > = values in this interval not displayed.   Basic Metabolic Panel Recent Labs    02/05/70 0302 02/26/19 1401  NA 136 138  K 3.7 3.0*  CL 98 101  CO2 28 26  GLUCOSE 109* 121*  BUN 21* 16  CREATININE 0.74 0.65  CALCIUM 9.6 7.8*  MG  --  1.8   Liver Function Tests Recent Labs    02/24/19 0445  AST 41  ALT 33  ALKPHOS 63  BILITOT 0.7  PROT 7.1  ALBUMIN 3.0*   No results for input(s): LIPASE, AMYLASE in the last 72 hours. Cardiac Enzymes No results for input(s): CKTOTAL, CKMB, CKMBINDEX, TROPONINI in the last 72 hours.  BNP: BNP (last 3 results) Recent Labs    02/10/19 0219  BNP 2,048.6*    ProBNP (last 3 results) No results for input(s): PROBNP in the last 8760 hours.   D-Dimer Recent Labs    02/26/19 1411  DDIMER 1.74*   Hemoglobin A1C No results  for input(s): HGBA1C in the last 72 hours. Fasting Lipid Panel No results for input(s): CHOL, HDL, LDLCALC, TRIG, CHOLHDL, LDLDIRECT in the last 72 hours. Thyroid Function Tests No results for input(s): TSH, T4TOTAL, T3FREE, THYROIDAB in the last 72 hours.  Invalid input(s): FREET3  Other results:   Imaging    DG Chest 1 View  Result Date: 02/26/2019 CLINICAL DATA:  Left ventricular assist device. EXAM: CHEST  1 VIEW COMPARISON:  02/24/2019 FINDINGS: Endotracheal tube tip noted 3 cm above the carina. Swan-Ganz catheter tip noted over the pulmonary outflow tract. Mediastinal drainage catheter noted over the mid mediastinum. Pericardial drainage catheter noted over the left lower chest. Bilateral chest tubes noted over the upper chest. Metallic tubing noted over the right chest, this may be outside the patient. No pneumothorax. No pleural effusion. Right upper lobe atelectasis. Prior median sternotomy. Left ventricular assist device noted over the  left lower chest. Stable cardiomegaly. IMPRESSION: 1. Lines and tubes including bilateral chest tubes as above. No pneumothorax. 2.  Right upper lobe atelectasis. 3. Prior median sternotomy. Left ventricular assist device noted over the left lower chest. Stable cardiomegaly. Electronically Signed   By: Marcello Moores  Register   On: 02/26/2019 13:53   DG Chest Port 1 View  Result Date: 02/26/2019 CLINICAL DATA:  Follow-up LVAD EXAM: PORTABLE CHEST 1 VIEW COMPARISON:  02/25/2018 FINDINGS: Left ventricular assist device is again seen and stable. Bilateral chest tubes are in noted. Nasogastric catheter, mediastinal drain, endotracheal tube and Swan-Ganz catheter are noted as well. No pneumothorax is seen. Improved aeration in the right upper lobe is noted. No sizable effusion is noted. IMPRESSION: Postoperative change with improved aeration in the right upper lobe. Tubes and lines as described above. Electronically Signed   By: Inez Catalina M.D.   On: 02/26/2019 14:05   DG Chest Portable 1 View  Result Date: 02/26/2019 CLINICAL DATA:  Status post left ventricular assist device placement. EXAM: PORTABLE CHEST 1 VIEW COMPARISON:  02/24/2019 FINDINGS: Left ventricular assist device. Large bore right internal jugular line tip at low SVC. Right internal jugular Swan-Ganz catheter tip at pulmonary outflow tract. Bilateral chest tubes. A probable right femoral line terminating at the high right atrium. Endotracheal tube terminates 4.4 cm above carina. Midline trachea. Mild cardiomegaly. No pleural fluid. Right upper lobe opacification, likely due to atelectasis. Mild pulmonary venous congestion. No pneumothorax. IMPRESSION: Right upper lobe opacification, likely due to atelectasis. Aspiration cannot be excluded. Cardiomegaly with mild pulmonary venous congestion. Appropriate position of support apparatus, without pneumothorax. Electronically Signed   By: Abigail Miyamoto M.D.   On: 02/26/2019 13:49   ECHO INTRAOPERATIVE  TEE  Result Date: 02/26/2019  *INTRAOPERATIVE TRANSESOPHAGEAL REPORT *  Patient Name:   Jon Martin Date of Exam: 02/26/2019 Medical Rec #:  [id number]       Height:       68.0 in Accession #:    [id number]      Weight:       92.6 lb Date of Birth:  June 03, 1985        BSA:          1.47 m Patient Age:    33 years        BP:           105/87 mmHg Patient Gender: M               HR:           120 bpm. Exam Location:  Inpatient Transesophogeal exam was  perform intraoperatively during surgical procedure. Patient was closely monitored under general anesthesia during the entirety of examination. Indications:     Insertion of Implantable Left Ventricular Assist Device Performing Phys: [id number] PETER VAN TRIGT Diagnosing Phys: Heather Roberts MD Complications: No known complications during this procedure. POST-OP IMPRESSIONS - Comments: Heartmate 3 placed in good position. RV function improved. Valves otherwise inchanged. PRE-OP FINDINGS  Left Ventricle: The left ventricle has severely reduced systolic function, with an ejection fraction of 20-25%. The cavity size was severely dilated. There is no increase in left ventricular wall thickness. 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 Right Ventricle: The right ventricle has severely reduced systolic function. The cavity was moderately enlarged. There is no increase in right ventricular wall thickness. Left Atrium: Left atrial size was dilated. Left atrial appendage velocity is reduced at less than 40 cm/s. Right Atrium: Right atrial size was dilated. Right atrial pressure is estimated at 10 mmHg. Interatrial Septum: Evidence of atrial level shunting detected by color flow Doppler. A small patent foramen ovale is detected with predominantly left to right shunting across the atrial septum. Pericardium: A small pericardial effusion is present. The pericardial effusion is circumferential. There is no evidence of cardiac tamponade. Mitral Valve: The mitral valve is dilated.  No thickening of the mitral valve leaflet. No calcification of the mitral valve leaflet. Mitral valve regurgitation is moderate to severe by color flow Doppler. The MR jet is posteriorly-directed. There is no evidence of mitral valve vegetation. Tricuspid Valve: The tricuspid valve was dilated in appearance. Tricuspid valve regurgitation is mild by color flow Doppler. The jet is directed eccentrically. The tricuspid valve is mildly thickened. Aortic Valve: The aortic valve is bicuspid There is mild thickening of the aortic valve and There is mild calcification of the aortic valve Aortic valve regurgitation is trivial by color flow Doppler. There is no stenosis of the aortic valve. There is no  evidence of a vegetation on the aortic valve. Pulmonic Valve: The pulmonic valve was normal in structure. Pulmonic valve regurgitation is trivial by color flow Doppler. Aorta: The aorta was not well visualized. The descending aorta was not well visualized.  Heather Roberts MD Electronically signed by Heather Roberts MD Signature Date/Time: 02/26/2019/1:37:37 PM    Final      Medications:     Scheduled Medications:  [START ON 02/27/2019] acetaminophen  1,000 mg Oral Q6H   Or   [START ON 02/27/2019] acetaminophen (TYLENOL) oral liquid 160 mg/5 mL  1,000 mg Per Tube Q6H   [COMPLETED] acetaminophen (TYLENOL) oral liquid 160 mg/5 mL  650 mg Per Tube Once   Or   [COMPLETED] acetaminophen  650 mg Rectal Once   [START ON 02/27/2019] aspirin EC  325 mg Oral Daily   Or   [START ON 02/27/2019] aspirin  324 mg Per Tube Daily   Or   [START ON 02/27/2019] aspirin  300 mg Rectal Daily   [START ON 02/27/2019] bisacodyl  10 mg Oral Daily   Or   [START ON 02/27/2019] bisacodyl  10 mg Rectal Daily   Chlorhexidine Gluconate Cloth  6 each Topical Daily   [START ON 02/27/2019] docusate sodium  200 mg Oral Daily   levalbuterol  1.25 mg Nebulization Q6H   metoCLOPramide (REGLAN) injection  10 mg Intravenous Q6H   [START ON  02/28/2019] pantoprazole  40 mg Oral Daily   [START ON 02/27/2019] rifampin  600 mg Oral Once   [START ON 02/27/2019] sodium chloride flush  3 mL Intravenous Q12H  Infusions:  sodium chloride Stopped (02/26/19 1442)   [START ON 02/27/2019] sodium chloride     sodium chloride 20 mL/hr at 02/26/19 1400   albumin human 12.5 g (02/26/19 1513)   cefUROXime (ZINACEF)  IV     dexmedetomidine (PRECEDEX) IV infusion 0.7 mcg/kg/hr (02/26/19 1500)   EPINEPHrine 4 mg in dextrose 5% 250 mL infusion (16 mcg/mL) 4 mcg/min (02/26/19 1500)   famotidine (PEPCID) IV Stopped (02/26/19 1440)   [START ON 02/27/2019] fluconazole (DIFLUCAN) IV     insulin 0.3 mL/hr at 02/26/19 1500   lactated ringers     lactated ringers     lactated ringers 20 mL/hr at 02/26/19 1500   magnesium sulfate 20 mL/hr at 02/26/19 1500   milrinone 0.375 mcg/kg/min (02/26/19 1500)   nitroGLYCERIN 10 mcg/min (02/26/19 1511)   norepinephrine (LEVOPHED) Adult infusion Stopped (02/26/19 1441)   potassium chloride 50 mL/hr at 02/26/19 1500   vancomycin      PRN Medications: sodium chloride, albumin human, dextrose, lactated ringers, midazolam, morphine injection, ondansetron (ZOFRAN) IV, [START ON 02/27/2019] sodium chloride flush, traMADol     Assessment/Plan   1. Acute/Chronic Systolic HF -> cardiogenic shock - ECHO at [facility] EF, 20%.  - ECHO repeated and showed severely reduced EF <20%. RV only mildly down - R/L cath 1/7 normal cors. Output ok   - CO-OX 50% on milrinone 0.5 mcg  and dobutamine 5.  - s/p HM-3 on 02/26/2019 - On milrinone 0.375 and epi 4 - Flows and PI a bit low. CVP 9 - Continue to support with albumin epi/milrinone - Watch CT output  2. VAD -  s/p HM-3 on 02/26/2019 -  RV down some - VAD interrogated personally. -  Flows and PI a bit low. CVP 9 - Continue to support with albumin epi/milrinone. Add sildenafil as more stable  3. Acute post-op respiratory failure - wean vent as  tolerated  4. ABL - keep HGB > 8.0   CRITICAL CARE Performed by: Arvilla Meres  Total critical care time: 45 minutes  Critical care time was exclusive of separately billable procedures and treating other patients.  Critical care was necessary to treat or prevent imminent or life-threatening deterioration.  Critical care was time spent personally by me (independent of midlevel providers or residents) on the following activities: development of treatment plan with patient and/or surrogate as well as nursing, discussions with consultants, evaluation of patient's response to treatment, examination of patient, obtaining history from patient or surrogate, ordering and performing treatments and interventions, ordering and review of laboratory studies, ordering and review of radiographic studies, pulse oximetry and re-evaluation of patient's condition.    Length of Stay: 17  Arvilla Meres, MD  02/26/2019, 3:44 PM  Advanced Heart Failure Team Pager [phone] (M-F; 7a - 4p)  Please contact [facility] for night-coverage after hours (4p -7a ) and weekends on [email or web address]

## 2019-02-26 NOTE — Progress Notes (Signed)
CSW spoke with patient's mother via phone along with VAD Coordinator who updated mother on patient's status. She reports she is home and will be available via home number for further updates throughout the day. CSW provided supportive intervention and will continue to follow throughout implant hospitalization. Lasandra Beech, LCSW, CCSW-MCS [phone]

## 2019-02-26 NOTE — Anesthesia Procedure Notes (Signed)
Procedure Name: Intubation Date/Time: 02/26/2019 7:45 AM Performed by: Larene Beach, CRNA Pre-anesthesia Checklist: Patient identified, Emergency Drugs available, Suction available and Patient being monitored Patient Re-evaluated:Patient Re-evaluated prior to induction Oxygen Delivery Method: Circle system utilized Preoxygenation: Pre-oxygenation with 100% oxygen Induction Type: IV induction Ventilation: Mask ventilation without difficulty Laryngoscope Size: Mac and 4 Grade View: Grade I Tube type: Oral Tube size: 7.5 mm Number of attempts: 1 Airway Equipment and Method: Stylet Placement Confirmation: ETT inserted through vocal cords under direct vision,  positive ETCO2 and breath sounds checked- equal and bilateral Secured at: 22 cm Tube secured with: Tape Dental Injury: Teeth and Oropharynx as per pre-operative assessment

## 2019-02-26 NOTE — Brief Op Note (Signed)
02/26/2019  2:34 PM  PATIENT:  Jon Martin  34 y.o. male  PRE-OPERATIVE DIAGNOSIS:  ICM HEART FAILURE  POST-OPERATIVE DIAGNOSIS:  ICM HEART FAILURE  PROCEDURE:  Procedure(s) with comments: INSERTION OF IMPLANTABLE LEFT VENTRICULAR ASSIST DEVICE (N/A) - HM3 TRANSESOPHAGEAL ECHOCARDIOGRAM (TEE) (N/A) Video Bronchoscopy (N/A)  SURGEON:  Surgeon(s) and Role:    * Kerin Perna, MD - Primary    * Bartle, Payton Doughty, MD - Assisting  PHYSICIAN ASSISTANT:   ASSISTANTS: OO Wagonner RNFA   ANESTHESIA:   general  EBL:  1100 mL   BLOOD ADMINISTERED:1 platelet 3 FFP 2 PRBC  DRAINS: bilateral pleural tubes, anterior mediastinal tube, posterior pericardial drain   LOCAL MEDICATIONS USED:  NONE  SPECIMEN:  Excision LV apex  DISPOSITION OF SPECIMEN:  PATHOLOGY  COUNTS:  YES  TOURNIQUET:  * No tourniquets in log *  DICTATION: .Dragon Dictation  PLAN OF CARE: ICU prior bed  PATIENT DISPOSITION:  ICU - intubated and hemodynamically stable.   Delay start of Pharmacological VTE agent (>24hrs) due to surgical blood loss or risk of bleeding: yes

## 2019-02-26 NOTE — Progress Notes (Signed)
K+= 3.0 and creat= 0.76 w/ urine o/p > 30cc/hr; TCTS KCL protocol initiated with 10 mEq KCL in 50cc IV x 3, each over one hour.

## 2019-02-26 NOTE — Transfer of Care (Signed)
Immediate Anesthesia Transfer of Care Note  Patient: Jon Martin  Procedure(s) Performed: INSERTION OF IMPLANTABLE LEFT VENTRICULAR ASSIST DEVICE (N/A Chest) TRANSESOPHAGEAL ECHOCARDIOGRAM (TEE) (N/A ) Video Bronchoscopy (N/A )  Patient Location: ICU  Anesthesia Type:General  Level of Consciousness: drowsy and patient cooperative  Airway & Oxygen Therapy: Patient remains intubated per anesthesia plan and Patient placed on Ventilator (see vital sign flow sheet for setting)  Post-op Assessment: Report given to RN and Post -op Vital signs reviewed and stable  Post vital signs: Reviewed and stable  Last Vitals:  Vitals Value Taken Time  BP    Temp    Pulse 30 02/26/19 1351  Resp 31 02/26/19 1353  SpO2 100 % 02/26/19 1351  Vitals shown include unvalidated device data.  Last Pain:  Vitals:   02/26/19 0400  TempSrc: Oral  PainSc:       Patients Stated Pain Goal: 0 (02/23/19 0400)  Complications: No apparent anesthesia complications   Patient transported to ICU with standard monitors (HR, BP, SPO2, RR) and emergency drugs/equipment. Controlled ventilation maintained via ventilator and Nitric Oxide. Report given to bedside RN and respiratory therapist. Pt connected to ICU monitor and ventilator. All questions answered and vital signs stable before leaving

## 2019-02-26 NOTE — Progress Notes (Signed)
Pharmacy Antibiotic Note  Jon Martin is a 34 y.o. male admitted on 02/09/2019 with surgical prophylaxis.  Pharmacy has been consulted for vancomycin dosing.  Patient now s/p HM3 LVAD implantation this morning 1/22. Orders to continue vancomycin for 48 hours post op prophylaxis. Normal renal function.   Plan: Vancomycin 500mg  IV q12 hours for 48 hours  Height: 5\' 8"  (172.7 cm) Weight: 92 lb 9.5 oz (42 kg) IBW/kg (Calculated) : 68.4  Temp (24hrs), Avg:98.5 F (36.9 C), Min:98.2 F (36.8 C), Max:99 F (37.2 C)  Recent Labs  Lab 02/22/19 0408 02/23/19 0322 02/24/19 0445 02/24/19 0449 02/25/19 0356 02/26/19 0302  WBC  --  7.6  --  7.2 6.6 7.0  CREATININE 0.87 0.76 0.78  --  0.81 0.74    Estimated Creatinine Clearance: 78 mL/min (by C-G formula based on SCr of 0.74 mg/dL).    No Known Allergies Thank you for allowing pharmacy to be a part of this patient's care.  02/27/19 PharmD., BCPS Clinical Pharmacist 02/26/2019 1:52 PM

## 2019-02-27 ENCOUNTER — Inpatient Hospital Stay (HOSPITAL_COMMUNITY): Payer: Medicaid - Out of State

## 2019-02-27 DIAGNOSIS — Z95811 Presence of heart assist device: Secondary | ICD-10-CM

## 2019-02-27 LAB — POCT I-STAT 7, (LYTES, BLD GAS, ICA,H+H)
Acid-Base Excess: 2 mmol/L (ref 0.0–2.0)
Bicarbonate: 25.2 mmol/L (ref 20.0–28.0)
Calcium, Ion: 1.13 mmol/L — ABNORMAL LOW (ref 1.15–1.40)
HCT: 28 % — ABNORMAL LOW (ref 39.0–52.0)
Hemoglobin: 9.5 g/dL — ABNORMAL LOW (ref 13.0–17.0)
O2 Saturation: 92 %
Patient temperature: 38.7
Potassium: 4.5 mmol/L (ref 3.5–5.1)
Sodium: 136 mmol/L (ref 135–145)
TCO2: 26 mmol/L (ref 22–32)
pCO2 arterial: 34.9 mmHg (ref 32.0–48.0)
pH, Arterial: 7.472 — ABNORMAL HIGH (ref 7.350–7.450)
pO2, Arterial: 64 mmHg — ABNORMAL LOW (ref 83.0–108.0)

## 2019-02-27 LAB — GLUCOSE, CAPILLARY
Glucose-Capillary: 123 mg/dL — ABNORMAL HIGH (ref 70–99)
Glucose-Capillary: 133 mg/dL — ABNORMAL HIGH (ref 70–99)
Glucose-Capillary: 129 mg/dL — ABNORMAL HIGH (ref 70–99)
Glucose-Capillary: 93 mg/dL (ref 70–99)
Glucose-Capillary: 132 mg/dL — ABNORMAL HIGH (ref 70–99)
Glucose-Capillary: 161 mg/dL — ABNORMAL HIGH (ref 70–99)
Glucose-Capillary: 137 mg/dL — ABNORMAL HIGH (ref 70–99)
Glucose-Capillary: 151 mg/dL — ABNORMAL HIGH (ref 70–99)
Glucose-Capillary: 134 mg/dL — ABNORMAL HIGH (ref 70–99)
Glucose-Capillary: 143 mg/dL — ABNORMAL HIGH (ref 70–99)
Glucose-Capillary: 147 mg/dL — ABNORMAL HIGH (ref 70–99)
Glucose-Capillary: 142 mg/dL — ABNORMAL HIGH (ref 70–99)
Glucose-Capillary: 151 mg/dL — ABNORMAL HIGH (ref 70–99)
Glucose-Capillary: 137 mg/dL — ABNORMAL HIGH (ref 70–99)
Glucose-Capillary: 119 mg/dL — ABNORMAL HIGH (ref 70–99)
Glucose-Capillary: 126 mg/dL — ABNORMAL HIGH (ref 70–99)
Glucose-Capillary: 157 mg/dL — ABNORMAL HIGH (ref 70–99)

## 2019-02-27 LAB — BPAM FFP
Blood Product Expiration Date: 202101272359
Blood Product Expiration Date: 202101272359
Blood Product Expiration Date: 202101272359
Blood Product Expiration Date: 202101272359
Blood Product Expiration Date: 202101242359
Blood Product Expiration Date: 202101262359
ISSUE DATE / TIME: 202101220910
ISSUE DATE / TIME: 202101220910
ISSUE DATE / TIME: 202101220910
ISSUE DATE / TIME: 202101220910
ISSUE DATE / TIME: 202101221132
ISSUE DATE / TIME: 202101221132
Unit Type and Rh: 5100
Unit Type and Rh: 5100
Unit Type and Rh: 5100
Unit Type and Rh: 5100
Unit Type and Rh: 600
Unit Type and Rh: 6200

## 2019-02-27 LAB — CBC WITH DIFFERENTIAL/PLATELET
Abs Immature Granulocytes: 0.05 10*3/uL (ref 0.00–0.07)
Basophils Absolute: 0.1 10*3/uL (ref 0.0–0.1)
Basophils Relative: 1 %
Eosinophils Absolute: 0.3 10*3/uL (ref 0.0–0.5)
Eosinophils Relative: 3 %
HCT: 28.1 % — ABNORMAL LOW (ref 39.0–52.0)
Hemoglobin: 9.7 g/dL — ABNORMAL LOW (ref 13.0–17.0)
Immature Granulocytes: 0 %
Lymphocytes Relative: 11 %
Lymphs Abs: 1.3 10*3/uL (ref 0.7–4.0)
MCH: 32 pg (ref 26.0–34.0)
MCHC: 34.5 g/dL (ref 30.0–36.0)
MCV: 92.7 fL (ref 80.0–100.0)
Monocytes Absolute: 1.3 10*3/uL — ABNORMAL HIGH (ref 0.1–1.0)
Monocytes Relative: 11 %
Neutro Abs: 8.7 10*3/uL — ABNORMAL HIGH (ref 1.7–7.7)
Neutrophils Relative %: 74 %
Platelets: 191 10*3/uL (ref 150–400)
RBC: 3.03 MIL/uL — ABNORMAL LOW (ref 4.22–5.81)
RDW: 16 % — ABNORMAL HIGH (ref 11.5–15.5)
WBC: 11.6 10*3/uL — ABNORMAL HIGH (ref 4.0–10.5)
nRBC: 0 % (ref 0.0–0.2)

## 2019-02-27 LAB — PREPARE FRESH FROZEN PLASMA
Unit division: 0
Unit division: 0
Unit division: 0
Unit division: 0

## 2019-02-27 LAB — CBC
HCT: 28.2 % — ABNORMAL LOW (ref 39.0–52.0)
HCT: 25.3 % — ABNORMAL LOW (ref 39.0–52.0)
Hemoglobin: 9.4 g/dL — ABNORMAL LOW (ref 13.0–17.0)
Hemoglobin: 9 g/dL — ABNORMAL LOW (ref 13.0–17.0)
MCH: 31.3 pg (ref 26.0–34.0)
MCH: 32.7 pg (ref 26.0–34.0)
MCHC: 33.3 g/dL (ref 30.0–36.0)
MCHC: 35.6 g/dL (ref 30.0–36.0)
MCV: 94 fL (ref 80.0–100.0)
MCV: 92 fL (ref 80.0–100.0)
Platelets: 207 10*3/uL (ref 150–400)
Platelets: 183 10*3/uL (ref 150–400)
RBC: 3 MIL/uL — ABNORMAL LOW (ref 4.22–5.81)
RBC: 2.75 MIL/uL — ABNORMAL LOW (ref 4.22–5.81)
RDW: 16.3 % — ABNORMAL HIGH (ref 11.5–15.5)
RDW: 15.7 % — ABNORMAL HIGH (ref 11.5–15.5)
WBC: 10.8 10*3/uL — ABNORMAL HIGH (ref 4.0–10.5)
WBC: 13.6 10*3/uL — ABNORMAL HIGH (ref 4.0–10.5)
nRBC: 0 % (ref 0.0–0.2)
nRBC: 0 % (ref 0.0–0.2)

## 2019-02-27 LAB — COMPREHENSIVE METABOLIC PANEL
ALT: 30 U/L (ref 0–44)
AST: 132 U/L — ABNORMAL HIGH (ref 15–41)
Albumin: 3.2 g/dL — ABNORMAL LOW (ref 3.5–5.0)
Alkaline Phosphatase: 47 U/L (ref 38–126)
Anion gap: 9 (ref 5–15)
BUN: 13 mg/dL (ref 6–20)
CO2: 23 mmol/L (ref 22–32)
Calcium: 8.2 mg/dL — ABNORMAL LOW (ref 8.9–10.3)
Chloride: 103 mmol/L (ref 98–111)
Creatinine, Ser: 0.76 mg/dL (ref 0.61–1.24)
GFR calc Af Amer: >60 mL/min (ref >60)
GFR calc non Af Amer: >60 mL/min (ref >60)
Glucose, Bld: 134 mg/dL — ABNORMAL HIGH (ref 70–99)
Potassium: 4.4 mmol/L (ref 3.5–5.1)
Sodium: 135 mmol/L (ref 135–145)
Total Bilirubin: 4.7 mg/dL — ABNORMAL HIGH (ref 0.3–1.2)
Total Protein: 5.6 g/dL — ABNORMAL LOW (ref 6.5–8.1)

## 2019-02-27 LAB — BASIC METABOLIC PANEL
Anion gap: 11 (ref 5–15)
BUN: 10 mg/dL (ref 6–20)
CO2: 21 mmol/L — ABNORMAL LOW (ref 22–32)
Calcium: 8.4 mg/dL — ABNORMAL LOW (ref 8.9–10.3)
Chloride: 104 mmol/L (ref 98–111)
Creatinine, Ser: 0.7 mg/dL (ref 0.61–1.24)
GFR calc Af Amer: >60 mL/min (ref >60)
GFR calc non Af Amer: >60 mL/min (ref >60)
Glucose, Bld: 134 mg/dL — ABNORMAL HIGH (ref 70–99)
Potassium: 3.4 mmol/L — ABNORMAL LOW (ref 3.5–5.1)
Sodium: 136 mmol/L (ref 135–145)

## 2019-02-27 LAB — BPAM PLATELET PHERESIS
Blood Product Expiration Date: 202101242359
ISSUE DATE / TIME: 202101221211
Unit Type and Rh: 6200

## 2019-02-27 LAB — BLOOD GAS, ARTERIAL
Acid-base deficit: 0.6 mmol/L (ref 0.0–2.0)
Bicarbonate: 22.6 mmol/L (ref 20.0–28.0)
FIO2: 44
O2 Saturation: 98.4 %
Patient temperature: 37
pCO2 arterial: 31.7 mmHg — ABNORMAL LOW (ref 32.0–48.0)
pH, Arterial: 7.468 — ABNORMAL HIGH (ref 7.350–7.450)
pO2, Arterial: 105 mmHg (ref 83.0–108.0)

## 2019-02-27 LAB — COOXEMETRY PANEL
Carboxyhemoglobin: 1.3 % (ref 0.5–1.5)
Carboxyhemoglobin: 1.3 % (ref 0.5–1.5)
Methemoglobin: 1 % (ref 0.0–1.5)
Methemoglobin: 0.9 % (ref 0.0–1.5)
O2 Saturation: 58.4 %
O2 Saturation: 60.4 %
Total hemoglobin: 9.1 g/dL — ABNORMAL LOW (ref 12.0–16.0)
Total hemoglobin: 8.7 g/dL — ABNORMAL LOW (ref 12.0–16.0)

## 2019-02-27 LAB — PREPARE PLATELET PHERESIS: Unit division: 0

## 2019-02-27 LAB — MAGNESIUM
Magnesium: 2.3 mg/dL (ref 1.7–2.4)
Magnesium: 2 mg/dL (ref 1.7–2.4)

## 2019-02-27 LAB — LACTATE DEHYDROGENASE: LDH: 418 U/L — ABNORMAL HIGH (ref 98–192)

## 2019-02-27 LAB — BRAIN NATRIURETIC PEPTIDE: B Natriuretic Peptide: 447 pg/mL — ABNORMAL HIGH (ref 0.0–100.0)

## 2019-02-27 LAB — CALCIUM, IONIZED: Calcium, Ionized, Serum: 4.3 mg/dL — ABNORMAL LOW (ref 4.5–5.6)

## 2019-02-27 LAB — PROTIME-INR
INR: 1.3 — ABNORMAL HIGH (ref 0.8–1.2)
Prothrombin Time: 16.4 seconds — ABNORMAL HIGH (ref 11.4–15.2)

## 2019-02-27 LAB — PHOSPHORUS: Phosphorus: 4 mg/dL (ref 2.5–4.6)

## 2019-02-27 MED ORDER — AMIODARONE HCL IN DEXTROSE 360-4.14 MG/200ML-% IV SOLN
30.0000 mg/h | INTRAVENOUS | Status: DC
  Administered 2019-02-28 – 2019-03-01 (×3): 30 mg/h via INTRAVENOUS
  Filled 2019-02-27 (×3): qty 200

## 2019-02-27 MED ORDER — SODIUM CHLORIDE 0.9% FLUSH
10.0000 mL | Freq: Two times a day (BID) | INTRAVENOUS | Status: DC
  Administered 2019-02-27 – 2019-03-14 (×24): 10 mL via INTRAVENOUS

## 2019-02-27 MED ORDER — SODIUM CHLORIDE 0.9% FLUSH: 10.0000 mL | INTRAVENOUS | Status: DC | PRN

## 2019-02-27 MED ORDER — AMIODARONE HCL IN DEXTROSE 360-4.14 MG/200ML-% IV SOLN
60.0000 mg/h | INTRAVENOUS | Status: AC
  Administered 2019-02-27 (×2): 60 mg/h via INTRAVENOUS
  Filled 2019-02-27 (×2): qty 200

## 2019-02-27 MED ORDER — POTASSIUM CHLORIDE 10 MEQ/50ML IV SOLN
10.0000 meq | INTRAVENOUS | Status: AC
  Administered 2019-02-27 (×2): 10 meq via INTRAVENOUS
  Filled 2019-02-27 (×2): qty 50

## 2019-02-27 MED ORDER — POTASSIUM CHLORIDE 10 MEQ/50ML IV SOLN
10.0000 meq | INTRAVENOUS | Status: AC
  Administered 2019-02-27 (×3): 10 meq via INTRAVENOUS
  Filled 2019-02-27 (×3): qty 50

## 2019-02-27 MED ORDER — DIGOXIN 0.25 MG/ML IJ SOLN
0.2500 mg | Freq: Every day | INTRAMUSCULAR | Status: AC
  Administered 2019-02-27: 0.25 mg via INTRAVENOUS
  Filled 2019-02-27: qty 2

## 2019-02-27 MED ORDER — ORAL CARE MOUTH RINSE
15.0000 mL | Freq: Two times a day (BID) | OROMUCOSAL | Status: DC
  Administered 2019-02-27 – 2019-03-14 (×26): 15 mL via OROMUCOSAL

## 2019-02-27 MED ORDER — INSULIN ASPART 100 UNIT/ML ~~LOC~~ SOLN
0.0000 [IU] | SUBCUTANEOUS | Status: DC
  Administered 2019-02-27 – 2019-03-01 (×7): 2 [IU] via SUBCUTANEOUS

## 2019-02-27 MED ORDER — OXYCODONE HCL 5 MG PO TABS
5.0000 mg | ORAL_TABLET | ORAL | Status: DC | PRN
  Administered 2019-02-27 – 2019-02-28 (×3): 5 mg via ORAL
  Filled 2019-02-27 (×3): qty 1

## 2019-02-27 MED ORDER — SODIUM CHLORIDE 0.9 % IV SOLN
600.0000 mg | Freq: Once | INTRAVENOUS | Status: AC
  Administered 2019-02-27: 600 mg via INTRAVENOUS
  Filled 2019-02-27: qty 600

## 2019-02-27 MED ORDER — MIDAZOLAM HCL 2 MG/2ML IJ SOLN: 1.0000 mg | INTRAMUSCULAR | Status: DC | PRN

## 2019-02-27 MED ORDER — MORPHINE SULFATE (PF) 2 MG/ML IV SOLN
2.0000 mg | INTRAVENOUS | Status: DC | PRN
  Administered 2019-02-27: 2 mg via INTRAVENOUS
  Filled 2019-02-27: qty 1

## 2019-02-27 MED ORDER — FUROSEMIDE 10 MG/ML IJ SOLN
20.0000 mg | Freq: Two times a day (BID) | INTRAMUSCULAR | Status: DC
  Administered 2019-02-27 (×2): 20 mg via INTRAVENOUS
  Filled 2019-02-27 (×2): qty 2

## 2019-02-27 MED ORDER — ACETAMINOPHEN 10 MG/ML IV SOLN
1000.0000 mg | Freq: Once | INTRAVENOUS | Status: AC
  Administered 2019-02-27: 1000 mg via INTRAVENOUS
  Filled 2019-02-27: qty 100

## 2019-02-27 MED ORDER — FAMOTIDINE IN NACL 20-0.9 MG/50ML-% IV SOLN
20.0000 mg | Freq: Two times a day (BID) | INTRAVENOUS | Status: DC
  Administered 2019-02-27 – 2019-03-01 (×4): 20 mg via INTRAVENOUS
  Filled 2019-02-27 (×4): qty 50

## 2019-02-27 MED ORDER — DIGOXIN 125 MCG PO TABS
0.1250 mg | ORAL_TABLET | Freq: Every day | ORAL | Status: DC
  Administered 2019-02-28 – 2019-03-15 (×16): 0.125 mg via ORAL
  Filled 2019-02-27 (×16): qty 1

## 2019-02-27 NOTE — Progress Notes (Signed)
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     Objective:    Vital Signs:   Temp:  [97.3 F (36.3 C)-101.5 F (38.6 C)] 99.5 F (37.5 C) (01/23 0700) Pulse Rate:  [29-60] 58 (01/23 0700) Resp:  [12-30] 19 (01/23 0700) BP: (100-107)/(79) 107/79 (01/23 0812) SpO2:  [87 %-100 %] 99 % (01/23 0803) Arterial Line BP: (93-112)/(65-91) 101/73 (01/23 0700) FiO2 (%):  [50 %] 50 % (01/23 0806) Weight:  [52.8 kg] 52.8 kg (01/23 0500) Last BM Date: 02/25/19 Mean arterial Pressure 80-90 mm Hg  Intake/Output:   Intake/Output Summary (Last 24 hours) at 02/27/2019 0826 Last data filed at 02/27/2019 0800 Gross per 24 hour  Intake 6500.15 ml  Output 4210 ml  Net 2290.15 ml     Physical Exam: HEENT: normal  General:  Sedated  on vent.  ENT : JVP normal Carotids; no bruits. No lymphadenopathy or thryomegaly appreciated. Cor: Mechanical heart sounds with LVAD hum present. Lungs: clear Abdomen: soft, nontender, nondistended. No hepatosplenomegaly. No bruits or masses. Good bowel sounds. Extremities: no cyanosis, clubbing, rash, edema Neuro: alert & orientedx3, cranial nerves grossly intact. moves all 4 extremities w/o difficulty. Affect pleasant  Telemetry: nsr rate 100  Labs: Basic Metabolic Panel: Recent Labs  Lab 02/23/19 0322 02/24/19 0445 02/25/19 0356 02/25/19 1143 02/26/19 0302 02/26/19 0302 02/26/19 1401 02/26/19 1408 02/26/19 1541 02/26/19 2016 02/26/19 2024 02/27/19 0430 02/27/19 0441  NA 132*   < > 133*   < > 136   < > 138   < > 140 133* 139 135 136  K 3.7   < > 3.8   < > 3.7   < > 3.0*   < > 3.4* 3.0* 3.1* 4.4 4.5  CL 94*   < >  95*  --  98  --  101  --   --  101  --  103  --   CO2 28   < > 28  --  28  --  26  --   --  23  --  23  --   GLUCOSE 123*   < > 195*  --  109*  --  121*  --   --  135*  --  134*  --   BUN 25*   < > 21*  --  21*  --  16  --   --  15  --  13  --   CREATININE 0.76   < > 0.81  --  0.74  --  0.65  --   --  0.72  --  0.76  --   CALCIUM 9.6   < > 9.6  --  9.6   < > 7.8*  --   --  7.9*  --  8.2*  --   MG 1.9  --   --   --   --   --  1.8  --   --  2.6*  --  2.3  --   PHOS  --   --   --   --   --   --   --   --   --   --   --  4.0  --    < > =  values in this interval not displayed.    Liver Function Tests: Recent Labs  Lab 02/22/19 1119 02/24/19 0445 02/27/19 0430  AST 27 41 132*  ALT 24 33 30  ALKPHOS 58 63 47  BILITOT 0.9 0.7 4.7*  PROT 7.3 7.1 5.6*  ALBUMIN 3.1* 3.0* 3.2*   No results for input(s): LIPASE, AMYLASE in the last 168 hours. No results for input(s): AMMONIA in the last 168 hours.  CBC: Recent Labs  Lab 02/26/19 1541 02/26/19 1541 02/26/19 1810 02/26/19 1810 02/26/19 2016 02/26/19 2024 02/27/19 0226 02/27/19 0430 02/27/19 0441  WBC 11.0*  --  10.7*  --  11.2*  --  10.8* 11.6*  --   NEUTROABS  --   --   --   --   --   --   --  8.7*  --   HGB 8.2*  8.8*   < > 8.7*   < > 9.6* 9.9* 9.4* 9.7* 9.5*  HCT 24.4*  26.0*   < > 26.2*   < > 28.1* 29.0* 28.2* 28.1* 28.0*  MCV 96.1  --  94.6  --  93.7  --  94.0 92.7  --   PLT 195  --  178  --  185  --  207 191  --    < > = values in this interval not displayed.    INR: Recent Labs  Lab 02/24/19 0445 02/26/19 1411 02/27/19 0430  INR 1.1 1.3* 1.3*    Other results:  EKG:   Imaging: DG Chest 1 View  Result Date: 02/26/2019 CLINICAL DATA:  Left ventricular assist device. EXAM: CHEST  1 VIEW COMPARISON:  02/24/2019 FINDINGS: Endotracheal tube tip noted 3 cm above the carina. Swan-Ganz catheter tip noted over the pulmonary outflow tract. Mediastinal drainage catheter noted over the mid mediastinum. Pericardial  drainage catheter noted over the left lower chest. Bilateral chest tubes noted over the upper chest. Metallic tubing noted over the right chest, this may be outside the patient. No pneumothorax. No pleural effusion. Right upper lobe atelectasis. Prior median sternotomy. Left ventricular assist device noted over the left lower chest. Stable cardiomegaly. IMPRESSION: 1. Lines and tubes including bilateral chest tubes as above. No pneumothorax. 2.  Right upper lobe atelectasis. 3. Prior median sternotomy. Left ventricular assist device noted over the left lower chest. Stable cardiomegaly. Electronically Signed   By: Marcello Moores  Register   On: 02/26/2019 13:53   CARDIAC CATHETERIZATION  Result Date: 02/25/2019 Findings: On milrinone 0.5 and DBA 5 RA = 6 RV = 36/6 PA = 38/21 (29) PCW = 14 Fick cardiac output/index = 4.9/3.2 Thermo CO/CI = 4.4/2.8 PVR = 3.0 WU Ao sat = 97% PA sat = 67%, 67% PAPi = 2.8 Assessment: 1. Relatively well-compensated hemodynamics on dual inotropes. MV sat much improved from this am. Plan/Discussion: Proceed with durable VAD in am. Glori Bickers, MD 3:11 PM  DG Chest Port 1 View  Result Date: 02/26/2019 CLINICAL DATA:  Follow-up LVAD EXAM: PORTABLE CHEST 1 VIEW COMPARISON:  02/25/2018 FINDINGS: Left ventricular assist device is again seen and stable. Bilateral chest tubes are in noted. Nasogastric catheter, mediastinal drain, endotracheal tube and Swan-Ganz catheter are noted as well. No pneumothorax is seen. Improved aeration in the right upper lobe is noted. No sizable effusion is noted. IMPRESSION: Postoperative change with improved aeration in the right upper lobe. Tubes and lines as described above. Electronically Signed   By: Inez Catalina M.D.   On: 02/26/2019 14:05   DG Chest Portable  1 View  Result Date: 02/26/2019 CLINICAL DATA:  Status post left ventricular assist device placement. EXAM: PORTABLE CHEST 1 VIEW COMPARISON:  02/24/2019 FINDINGS: Left ventricular assist device.  Large bore right internal jugular line tip at low SVC. Right internal jugular Swan-Ganz catheter tip at pulmonary outflow tract. Bilateral chest tubes. A probable right femoral line terminating at the high right atrium. Endotracheal tube terminates 4.4 cm above carina. Midline trachea. Mild cardiomegaly. No pleural fluid. Right upper lobe opacification, likely due to atelectasis. Mild pulmonary venous congestion. No pneumothorax. IMPRESSION: Right upper lobe opacification, likely due to atelectasis. Aspiration cannot be excluded. Cardiomegaly with mild pulmonary venous congestion. Appropriate position of support apparatus, without pneumothorax. Electronically Signed   By: Abigail Miyamoto M.D.   On: 02/26/2019 13:49   ECHO INTRAOPERATIVE TEE  Result Date: 02/26/2019  *INTRAOPERATIVE TRANSESOPHAGEAL REPORT *  Patient Name:   NYLEN CREQUE Date of Exam: 02/26/2019 Medical Rec #:  [id number]       Height:       68.0 in Accession #:    [id number]      Weight:       92.6 lb Date of Birth:  1985-11-22        BSA:          1.47 m Patient Age:    33 years        BP:           105/87 mmHg Patient Gender: M               HR:           120 bpm. Exam Location:  Inpatient Transesophogeal exam was perform intraoperatively during surgical procedure. Patient was closely monitored under general anesthesia during the entirety of examination. Indications:     Insertion of Implantable Left Ventricular Assist Device Performing Phys: [id number] VAN TRIGT Diagnosing Phys: Duane Boston MD Complications: No known complications during this procedure. POST-OP IMPRESSIONS - Comments: Heartmate 3 placed in good position. RV function improved. Valves otherwise inchanged. PRE-OP FINDINGS  Left Ventricle: The left ventricle has severely reduced systolic function, with an ejection fraction of 20-25%. The cavity size was severely dilated. There is no increase in left ventricular wall thickness. 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  Right Ventricle: The right ventricle has severely reduced systolic function. The cavity was moderately enlarged. There is no increase in right ventricular wall thickness. Left Atrium: Left atrial size was dilated. Left atrial appendage velocity is reduced at less than 40 cm/s. Right Atrium: Right atrial size was dilated. Right atrial pressure is estimated at 10 mmHg. Interatrial Septum: Evidence of atrial level shunting detected by color flow Doppler. A small patent foramen ovale is detected with predominantly left to right shunting across the atrial septum. Pericardium: A small pericardial effusion is present. The pericardial effusion is circumferential. There is no evidence of cardiac tamponade. Mitral Valve: The mitral valve is dilated. No thickening of the mitral valve leaflet. No calcification of the mitral valve leaflet. Mitral valve regurgitation is moderate to severe by color flow Doppler. The MR jet is posteriorly-directed. There is no evidence of mitral valve vegetation. Tricuspid Valve: The tricuspid valve was dilated in appearance. Tricuspid valve regurgitation is mild by color flow Doppler. The jet is directed eccentrically. The tricuspid valve is mildly thickened. Aortic Valve: The aortic valve is bicuspid There is mild thickening of the aortic valve and There is mild calcification of the aortic valve Aortic valve regurgitation is trivial by color flow  Doppler. There is no stenosis of the aortic valve. There is no  evidence of a vegetation on the aortic valve. Pulmonic Valve: The pulmonic valve was normal in structure. Pulmonic valve regurgitation is trivial by color flow Doppler. Aorta: The aorta was not well visualized. The descending aorta was not well visualized.  Duane Boston MD Electronically signed by Duane Boston MD Signature Date/Time: 02/26/2019/1:37:37 PM    Final       Medications:     Scheduled Medications: . acetaminophen  1,000 mg Oral Q6H   Or  . acetaminophen (TYLENOL) oral  liquid 160 mg/5 mL  1,000 mg Per Tube Q6H  . aspirin EC  325 mg Oral Daily   Or  . aspirin  324 mg Per Tube Daily   Or  . aspirin  300 mg Rectal Daily  . bisacodyl  10 mg Oral Daily   Or  . bisacodyl  10 mg Rectal Daily  . chlorhexidine gluconate (MEDLINE KIT)  15 mL Mouth Rinse BID  . Chlorhexidine Gluconate Cloth  6 each Topical Daily  . docusate sodium  200 mg Oral Daily  . furosemide  20 mg Intravenous BID  . insulin aspart  0-24 Units Subcutaneous Q4H  . levalbuterol  1.25 mg Nebulization Q6H  . mouth rinse  15 mL Mouth Rinse 10 times per day  . metoCLOPramide (REGLAN) injection  10 mg Intravenous Q6H  . [START ON 02/28/2019] pantoprazole  40 mg Oral Daily  . rifampin  600 mg Oral Once  . sodium chloride flush  10 mL Intravenous Q12H  . sodium chloride flush  3 mL Intravenous Q12H     Infusions: . sodium chloride Stopped (02/27/19 0605)  . sodium chloride    . sodium chloride 20 mL/hr at 02/26/19 1400  . acetaminophen    . albumin human 12.5 g (02/26/19 1513)  . cefUROXime (ZINACEF)  IV Stopped (02/27/19 0542)  . EPINEPHrine 4 mg in dextrose 5% 250 mL infusion (16 mcg/mL) 4 mcg/min (02/27/19 0800)  . famotidine (PEPCID) IV    . insulin 0.4 mL/hr at 02/27/19 0800  . lactated ringers    . lactated ringers 20 mL/hr at 02/27/19 0800  . milrinone 0.375 mcg/kg/min (02/27/19 0800)  . nitroGLYCERIN 15 mcg/min (02/27/19 0800)  . norepinephrine (LEVOPHED) Adult infusion Stopped (02/26/19 1441)  . vancomycin Stopped (02/27/19 0008)     PRN Medications:  sodium chloride, albumin human, dextrose, hydrALAZINE, midazolam, morphine injection, ondansetron (ZOFRAN) IV, sodium chloride flush, sodium chloride flush, traMADol   Assessment:  Postop coagulopathy- improved VAD parameters satisfactory Airway secretions/ RUL atelectasis- improved Postop bilrubin 4.2 - follow   Plan/Discussion:    Wean NO to 4ppm then rapid wean vent Leave drains Epi , milrinone for RV  support Maps run high- use iv NTG prn hydralazine  I reviewed the LVAD parameters from today, and compared the results to the patient's prior recorded data.  No programming changes were made.  The LVAD is functioning within specified parameters.  The patient performs LVAD self-test daily.  LVAD interrogation was negative for any significant power changes, alarms or PI events/speed drops.  LVAD equipment check completed and is in good working order.  Back-up equipment present.   LVAD education done on emergency procedures and precautions and reviewed exit site care.  Length of Stay: Shirley III 02/27/2019, 8:26 AM

## 2019-02-27 NOTE — Procedures (Signed)
Extubation Procedure Note  Patient Details:   Name: Jon Martin DOB: October 20, 1985 MRN: [id number]   Airway Documentation:    Vent end date: 02/27/19 Vent end time: 1200   Evaluation  O2 sats: stable throughout Complications: No apparent complications Patient did tolerate procedure well. Bilateral Breath Sounds: Clear, Diminished   Yes   RT extubated patient to 4ppm nitric with 6L nasal cannula per MD order/rapid wean protocol. RN at bedside. Positive cuff leak noted. VC 1600 and NIF -35. No stridor noted. Patient is able to speak. RT will continue to monitor as needed.   Lura Em 02/27/2019, 12:15 PM

## 2019-02-27 NOTE — Progress Notes (Signed)
Patient ID: Jon Martin, male   DOB: January 01, 1986, 34 y.o.   MRN: [id number]     Advanced Heart Failure Rounding Note  PCP-Cardiologist: No primary care provider on file.   Subjective:    34 y.o. 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  Underwent HM-3 placement 02/26/2019.  Remains intubated and sedated. On milrinone 0.375, epi 4 and iNO  Bleeding from CTs has slowed. MAPs stable. CVP 15. Getting IV lasix. Rhythm stable    LVAD Interrogation HM 3: Speed: 5500 Flow: 3.9 PI: 3.1 Power: 3.9 VAD interrogated personally. Parameters stable..   Objective:   Weight Range: 52.8 kg Body mass index is 17.7 kg/m.   Vital Signs:   Temp:  [97.3 F (36.3 C)-101.5 F (38.6 C)] 98.2 F (36.8 C) (01/23 1000) Pulse Rate:  [29-113] 32 (01/23 1000) Resp:  [12-30] 18 (01/23 1000) BP: (100-107)/(74-79) 103/75 (01/23 1011) SpO2:  [87 %-100 %] 100 % (01/23 1000) Arterial Line BP: (93-112)/(65-91) 104/75 (01/23 1000) FiO2 (%):  [50 %] 50 % (01/23 0904) Weight:  [52.8 kg] 52.8 kg (01/23 0500) Last BM Date: 02/25/19  Weight change: Filed Weights   02/25/19 0500 02/26/19 0500 02/27/19 0500  Weight: 48.3 kg 42 kg 52.8 kg    Intake/Output:   Intake/Output Summary (Last 24 hours) at 02/27/2019 1043 Last data filed at 02/27/2019 1000 Gross per 24 hour  Intake 5955.81 ml  Output 4755 ml  Net 1200.81 ml      Physical Exam   General:  Intubated sedated HEENT: normal + ETT Neck: supple. RIJ swan.  CVP up Carotids 2+ bilat; no bruits. No lymphadenopathy or thryomegaly appreciated. Cor: sternal dressing ok  CTs ok LVAD hum.  Lungs: Coarse Abdomen:soft, nontender, non-distended. No hepatosplenomegaly. No bruits or masses. Hypoactive bowel sounds. Driveline site clean. Anchor in place.  Extremities: no cyanosis, clubbing, rash. Warm 1+ edema  Neuro: intubated sedated   Telemetry  Sinus 100-110s + PVCss personally reviewed  Labs    CBC Recent Labs   02/27/19 0226 02/27/19 0226 02/27/19 0430 02/27/19 0441  WBC 10.8*  --  11.6*  --   NEUTROABS  --   --  8.7*  --   HGB 9.4*   < > 9.7* 9.5*  HCT 28.2*   < > 28.1* 28.0*  MCV 94.0  --  92.7  --   PLT 207  --  191  --    < > = values in this interval not displayed.   Basic Metabolic Panel Recent Labs    02/26/19 2016 02/26/19 2024 02/27/19 0430 02/27/19 0441  NA 133*   < > 135 136  K 3.0*   < > 4.4 4.5  CL 101  --  103  --   CO2 23  --  23  --   GLUCOSE 135*  --  134*  --   BUN 15  --  13  --   CREATININE 0.72  --  0.76  --   CALCIUM 7.9*  --  8.2*  --   MG 2.6*  --  2.3  --   PHOS  --   --  4.0  --    < > = values in this interval not displayed.   Liver Function Tests Recent Labs    02/27/19 0430  AST 132*  ALT 30  ALKPHOS 47  BILITOT 4.7*  PROT 5.6*  ALBUMIN 3.2*   No results for input(s): LIPASE, AMYLASE in the last 72 hours.  Cardiac Enzymes No results for input(s): CKTOTAL, CKMB, CKMBINDEX, TROPONINI in the last 72 hours.  BNP: BNP (last 3 results) Recent Labs    02/10/19 0219 02/27/19 0433  BNP 2,048.6* 447.0*    ProBNP (last 3 results) No results for input(s): PROBNP in the last 8760 hours.   D-Dimer Recent Labs    02/26/19 1411  DDIMER 1.74*   Hemoglobin A1C No results for input(s): HGBA1C in the last 72 hours. Fasting Lipid Panel No results for input(s): CHOL, HDL, LDLCALC, TRIG, CHOLHDL, LDLDIRECT in the last 72 hours. Thyroid Function Tests No results for input(s): TSH, T4TOTAL, T3FREE, THYROIDAB in the last 72 hours.  Invalid input(s): FREET3  Other results:   Imaging    DG Chest 1 View  Result Date: 02/26/2019 CLINICAL DATA:  Left ventricular assist device. EXAM: CHEST  1 VIEW COMPARISON:  02/24/2019 FINDINGS: Endotracheal tube tip noted 3 cm above the carina. Swan-Ganz catheter tip noted over the pulmonary outflow tract. Mediastinal drainage catheter noted over the mid mediastinum. Pericardial drainage catheter noted over  the left lower chest. Bilateral chest tubes noted over the upper chest. Metallic tubing noted over the right chest, this may be outside the patient. No pneumothorax. No pleural effusion. Right upper lobe atelectasis. Prior median sternotomy. Left ventricular assist device noted over the left lower chest. Stable cardiomegaly. IMPRESSION: 1. Lines and tubes including bilateral chest tubes as above. No pneumothorax. 2.  Right upper lobe atelectasis. 3. Prior median sternotomy. Left ventricular assist device noted over the left lower chest. Stable cardiomegaly. Electronically Signed   By: Marcello Moores  Register   On: 02/26/2019 13:53   DG Chest Port 1 View  Result Date: 02/27/2019 CLINICAL DATA:  LV assist device EXAM: PORTABLE CHEST 1 VIEW COMPARISON:  02/26/2019 FINDINGS: Left ventricular assist device unchanged. Bilateral chest tubes in place. Endotracheal tube in good position. Swan-Ganz catheter main pulmonary artery. NG tube in place stomach. No pneumothorax. Progression of left lower lobe atelectasis. Right upper lobe airspace disease also shows mild progression IMPRESSION: Support devices remain in good position.  No pneumothorax Progression of left lower lobe and right upper lobe airspace disease. Electronically Signed   By: Franchot Gallo M.D.   On: 02/27/2019 09:42   DG Chest Port 1 View  Result Date: 02/26/2019 CLINICAL DATA:  Follow-up LVAD EXAM: PORTABLE CHEST 1 VIEW COMPARISON:  02/25/2018 FINDINGS: Left ventricular assist device is again seen and stable. Bilateral chest tubes are in noted. Nasogastric catheter, mediastinal drain, endotracheal tube and Swan-Ganz catheter are noted as well. No pneumothorax is seen. Improved aeration in the right upper lobe is noted. No sizable effusion is noted. IMPRESSION: Postoperative change with improved aeration in the right upper lobe. Tubes and lines as described above. Electronically Signed   By: Inez Catalina M.D.   On: 02/26/2019 14:05   DG Chest Portable 1  View  Result Date: 02/26/2019 CLINICAL DATA:  Status post left ventricular assist device placement. EXAM: PORTABLE CHEST 1 VIEW COMPARISON:  02/24/2019 FINDINGS: Left ventricular assist device. Large bore right internal jugular line tip at low SVC. Right internal jugular Swan-Ganz catheter tip at pulmonary outflow tract. Bilateral chest tubes. A probable right femoral line terminating at the high right atrium. Endotracheal tube terminates 4.4 cm above carina. Midline trachea. Mild cardiomegaly. No pleural fluid. Right upper lobe opacification, likely due to atelectasis. Mild pulmonary venous congestion. No pneumothorax. IMPRESSION: Right upper lobe opacification, likely due to atelectasis. Aspiration cannot be excluded. Cardiomegaly with mild pulmonary venous congestion. Appropriate  position of support apparatus, without pneumothorax. Electronically Signed   By: Abigail Miyamoto M.D.   On: 02/26/2019 13:49     Medications:     Scheduled Medications: . acetaminophen  1,000 mg Oral Q6H   Or  . acetaminophen (TYLENOL) oral liquid 160 mg/5 mL  1,000 mg Per Tube Q6H  . aspirin EC  325 mg Oral Daily   Or  . aspirin  324 mg Per Tube Daily   Or  . aspirin  300 mg Rectal Daily  . bisacodyl  10 mg Oral Daily   Or  . bisacodyl  10 mg Rectal Daily  . chlorhexidine gluconate (MEDLINE KIT)  15 mL Mouth Rinse BID  . Chlorhexidine Gluconate Cloth  6 each Topical Daily  . docusate sodium  200 mg Oral Daily  . furosemide  20 mg Intravenous BID  . insulin aspart  0-24 Units Subcutaneous Q4H  . levalbuterol  1.25 mg Nebulization Q6H  . mouth rinse  15 mL Mouth Rinse 10 times per day  . metoCLOPramide (REGLAN) injection  10 mg Intravenous Q6H  . [START ON 02/28/2019] pantoprazole  40 mg Oral Daily  . sodium chloride flush  10 mL Intravenous Q12H  . sodium chloride flush  3 mL Intravenous Q12H    Infusions: . sodium chloride Stopped (02/27/19 0953)  . sodium chloride    . sodium chloride 20 mL/hr at  02/26/19 1400  . albumin human 12.5 g (02/26/19 1513)  . cefUROXime (ZINACEF)  IV Stopped (02/27/19 0542)  . EPINEPHrine 4 mg in dextrose 5% 250 mL infusion (16 mcg/mL) 4 mcg/min (02/27/19 1000)  . famotidine (PEPCID) IV Stopped (02/27/19 0949)  . insulin 0.3 mL/hr at 02/27/19 1000  . lactated ringers    . lactated ringers 20 mL/hr at 02/27/19 1000  . milrinone 0.375 mcg/kg/min (02/27/19 1000)  . nitroGLYCERIN 20 mcg/min (02/27/19 1000)  . norepinephrine (LEVOPHED) Adult infusion Stopped (02/26/19 1441)  . vancomycin 500 mg (02/27/19 1037)    PRN Medications: sodium chloride, albumin human, dextrose, hydrALAZINE, midazolam, morphine injection, ondansetron (ZOFRAN) IV, sodium chloride flush, sodium chloride flush, traMADol     Assessment/Plan   1. Acute/Chronic Systolic HF -> cardiogenic shock - ECHO at [facility] EF, 20%.  - ECHO repeated and showed severely reduced EF <20%. RV only mildly down - R/L cath 1/7 normal cors. Output ok   - s/p HM-3 on 02/26/2019 - On milrinone 0.375 and epi 4. Co-ox 58% - VAD interrogated personally. Parameters stable. - CVP 15. Agree with diuresis  2. VAD -  s/p HM-3 on 02/26/2019 -  RV down some - VAD interrogated personally. Parameters improved -  Add sildenafil as more stable - LDH 418 - Likely  tart warfarin tomorrow - D/w Dr. PVT  3. Acute post-op respiratory failure - wean vent as tolerated. Hopefully can extubate later today  4. Acute blood loss anemia - keep HGB > 8.0   CRITICAL CARE Performed by: Glori Bickers  Total critical care time: 35 minutes  Critical care time was exclusive of separately billable procedures and treating other patients.  Critical care was necessary to treat or prevent imminent or life-threatening deterioration.  Critical care was time spent personally by me (independent of midlevel providers or residents) on the following activities: development of treatment plan with patient and/or surrogate as well  as nursing, discussions with consultants, evaluation of patient's response to treatment, examination of patient, obtaining history from patient or surrogate, ordering and performing treatments and interventions, ordering and review  of laboratory studies, ordering and review of radiographic studies, pulse oximetry and re-evaluation of patient's condition.    Length of Stay: [place], [place]  02/27/2019, 10:43 AM  Advanced Heart Failure Team Pager [phone] (M-F; 7a - 4p)  Please contact [facility] for night-coverage after hours (4p -7a ) and weekends on [email or web address]

## 2019-02-27 NOTE — Op Note (Signed)
NAMETHELMER, LEGLER MEDICAL RECORD [id number] ACCOUNT [id number] DATE OF BIRTH:[birth year] FACILITY: [facility] LOCATION: [facility] PHYSICIAN:Arcola Freshour VAN TRIGT III, MD  OPERATIVE REPORT  DATE OF PROCEDURE:  02/25/2019  OPERATION: 1.  Implantation of HeartMate 3 left ventricular assist device. 2.  Video bronchoscopy.  SURGEON:  Ivin Poot, MD  ASSISTING SURGEON:  Susa Loffler, MD  ANESTHESIA:  General by Tad Moore, MD.  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  CLINICAL NOTE:  The patient is a 34 year old male who was transferred from an outside hospital after 2 admissions for heart failure in the past 6 weeks.  Echo shows EF of 15% with significant mitral regurgitation and mild tricuspid insufficiency and  moderate RV dysfunction.  Cardiac catheterization here demonstrates no significant coronary disease with low cardiac output and elevated filling pressures.  The patient was titrated on IV and oral inotropes for several days without improvement with  marginal cardiac output and demonstrating inotrope dependence.  The patient was evaluated by the mechanical support team and found to be an appropriate candidate for left ventricular assist device therapy.  I discussed the procedure of left ventricular  assist device implantation with the patient including the expected benefits, the alternatives and risks of bleeding, transfusion, infection, RV failure, organ failure, death.  He demonstrated his understanding and agreed to proceed with surgery under  what I felt was informed consent.  OPERATIVE FINDINGS: 1.  Severely dilated hypocontractile left ventricle with moderate RV dysfunction. 2.  Coagulopathy requiring blood product transfusion. 3.  Postoperative airway secretions plugging off the  right upper lobe on postoperative chest x-ray, requiring intraoperative bronchoscopy to clear secretions.  DESCRIPTION OF PROCEDURE:  The patient was brought from the ICU to the OR holding area and was placed supine for instrumentation of invasive hemodynamic monitoring lines.  Informed consent was documented and final issues addressed with the patient.  The  patient was brought to the operating room and placed supine on the operating table.  General anesthesia was induced.  He remained stable.  The transesophageal echo probe was placed by the anesthesia team.  The chest, abdomen and legs were prepped with  Betadine, draped as a sterile field.  A proper time-out was performed.  A sternal incision was made.  A midline abdominal incision was made as well for the power cord tunnel and a small incision in the left upper quadrant for the power cord exit site.  The sternal retractor was placed.  The pericardium was opened.  The pericardium had a moderate effusion.  Pursestrings were placed in the ascending aorta and right atrium.  Heparin was administered.  The patient was cannulated and placed on  cardiopulmonary bypass.  The ventricle was then positioned to expose the apex.  The apical coring knife was then used to remove an apical plug.  Through this opening, circumferentially placed 2-0 Ethibond pledgeted sutures were placed for the inflow cannula ring.  The ring was  seated through the sutures were placed and the sutures were tied.  Next, the HeartMate 3 pump was brought to the field and the inflow cannula was inserted into the inflow ring and locked in place in the proper orientation.  The pump was gently lowered  into the chest.  A small pocket had been made by opening the left pleura and taking down some of the left hemidiaphragm to allow space for the pump.  Next, the  power cord was tunneled from the HeartMate 3 pump in the abdominal wall anterior to the posterior rectus sheath and then directed out  the exit site to the left upper quadrant.  Next, the outflow graft was cut to the appropriate length and angle.  A partial occlusion clamp was placed in the ascending aorta and an end-to-side anastomosis between the outflow graft and the aorta was constructed with running 4-0 Prolene.  The suture  line was reinforced with a light layer of medical adhesive CoSeal.  The crossclamp was removed and a needle was placed in the graft of any air.  There was good hemostasis.  Next, a temporary pacing wire was placed in the right ventricle.  Next, the lungs reexpanded.  Ventilator was resumed.  The patient was started on inhaled nitric oxide and low-dose inotropes including milrinone, norepinephrine and epinephrine were started.  At this point, the patient was transitioned from cardiopulmonary bypass to the HeartMate 3 pump.  The first attempt was not successful because of RV filling and dysfunction, but after 10 minutes of perfusion with good mean pressure, a second attempt at  weaning off bypass was successful with adequate RV function and good function of the HeartMate 3 pump.  Echo showed the cannula to be in good position in the LV apex directed towards the mitral valve.  The pump speed was gradually increased from 5000 RPMs up to 5400 RPMs where there was good hemodynamics and good septal position.  Protamine was administered without adverse reaction.  The cannulas were removed.  There is diffuse coagulopathy after reversal of heparin with the protamine.  The patient needed FFP and platelets with some improvement in coagulopathy.  Platelet count was  normal.  Fibrinogen count was normal.  Bilateral pleural tubes and anterior mediastinum and left ventricular assist device pocket drains were placed and brought out through separate incisions.  The superior pericardium was closed over the aortic graft.  The sternum was closed with interrupted  figure-of-eight steel wire.  The patient remained stable.  The  pectoralis fascia and subcutaneous layers were closed with running Vicryl and skin was closed with subcuticular.  The power cord counter incision was closed in layers using Vicryl and the  exit site closed with nylon skin sutures.  A chest x-ray showed the pump to be in good position without pneumothorax.  However, the right upper lobe was collapsed because of airway secretions.  After redo bronchoscopy was performed, irrigation of the right upper lobe, the right upper lobe  bronchus had opened and was cleared for optimal oxygenation.  At that point, the patient was transferred back to the ICU in critical but stable condition.  Total cardiopulmonary bypass time was 90 minutes.  PN/NUANCE  D:02/26/2019 T:02/27/2019 JOB:[id number]

## 2019-02-27 NOTE — Progress Notes (Signed)
LVAD drive line dressing changed performed by this RN maintaining sterile technique throughout. Site clean with no drainage, stitches intact, and velour incorporated. Site and surrounding skin cleaned and silver gauze strip placed around drive line at entry site. Occlusive gauze dressing placed and drive line anchored on left hip and lateral left thigh.   Pt educated on importance of daily dressing changes acutely post-op.   Next dressing change due 02/28/19 by either VAD coordinator or nurse champion.

## 2019-02-28 ENCOUNTER — Inpatient Hospital Stay (HOSPITAL_COMMUNITY): Payer: Medicaid - Out of State

## 2019-02-28 LAB — POCT I-STAT 7, (LYTES, BLD GAS, ICA,H+H)
Acid-base deficit: 2 mmol/L (ref 0.0–2.0)
Acid-base deficit: 1 mmol/L (ref 0.0–2.0)
Acid-base deficit: 2 mmol/L (ref 0.0–2.0)
Bicarbonate: 21.1 mmol/L (ref 20.0–28.0)
Bicarbonate: 22.2 mmol/L (ref 20.0–28.0)
Bicarbonate: 20.6 mmol/L (ref 20.0–28.0)
Calcium, Ion: 1.15 mmol/L (ref 1.15–1.40)
Calcium, Ion: 1.15 mmol/L (ref 1.15–1.40)
Calcium, Ion: 1.17 mmol/L (ref 1.15–1.40)
HCT: 28 % — ABNORMAL LOW (ref 39.0–52.0)
HCT: 27 % — ABNORMAL LOW (ref 39.0–52.0)
HCT: 26 % — ABNORMAL LOW (ref 39.0–52.0)
Hemoglobin: 9.5 g/dL — ABNORMAL LOW (ref 13.0–17.0)
Hemoglobin: 9.2 g/dL — ABNORMAL LOW (ref 13.0–17.0)
Hemoglobin: 8.8 g/dL — ABNORMAL LOW (ref 13.0–17.0)
O2 Saturation: 95 %
O2 Saturation: 94 %
O2 Saturation: 92 %
Patient temperature: 35.5
Patient temperature: 36.7
Patient temperature: 36.9
Potassium: 3.5 mmol/L (ref 3.5–5.1)
Potassium: 3.5 mmol/L (ref 3.5–5.1)
Potassium: 4.2 mmol/L (ref 3.5–5.1)
Sodium: 137 mmol/L (ref 135–145)
Sodium: 137 mmol/L (ref 135–145)
Sodium: 132 mmol/L — ABNORMAL LOW (ref 135–145)
TCO2: 22 mmol/L (ref 22–32)
TCO2: 23 mmol/L (ref 22–32)
TCO2: 21 mmol/L — ABNORMAL LOW (ref 22–32)
pCO2 arterial: 27.6 mmHg — ABNORMAL LOW (ref 32.0–48.0)
pCO2 arterial: 30.1 mmHg — ABNORMAL LOW (ref 32.0–48.0)
pCO2 arterial: 26.2 mmHg — ABNORMAL LOW (ref 32.0–48.0)
pH, Arterial: 7.485 — ABNORMAL HIGH (ref 7.350–7.450)
pH, Arterial: 7.473 — ABNORMAL HIGH (ref 7.350–7.450)
pH, Arterial: 7.504 — ABNORMAL HIGH (ref 7.350–7.450)
pO2, Arterial: 64 mmHg — ABNORMAL LOW (ref 83.0–108.0)
pO2, Arterial: 64 mmHg — ABNORMAL LOW (ref 83.0–108.0)
pO2, Arterial: 57 mmHg — ABNORMAL LOW (ref 83.0–108.0)

## 2019-02-28 LAB — CBC WITH DIFFERENTIAL/PLATELET
Abs Immature Granulocytes: 0.11 10*3/uL — ABNORMAL HIGH (ref 0.00–0.07)
Basophils Absolute: 0.1 10*3/uL (ref 0.0–0.1)
Basophils Relative: 0 %
Eosinophils Absolute: 0.2 10*3/uL (ref 0.0–0.5)
Eosinophils Relative: 1 %
HCT: 25.7 % — ABNORMAL LOW (ref 39.0–52.0)
Hemoglobin: 8.8 g/dL — ABNORMAL LOW (ref 13.0–17.0)
Immature Granulocytes: 1 %
Lymphocytes Relative: 8 %
Lymphs Abs: 1.4 10*3/uL (ref 0.7–4.0)
MCH: 31.8 pg (ref 26.0–34.0)
MCHC: 34.2 g/dL (ref 30.0–36.0)
MCV: 92.8 fL (ref 80.0–100.0)
Monocytes Absolute: 2 10*3/uL — ABNORMAL HIGH (ref 0.1–1.0)
Monocytes Relative: 11 %
Neutro Abs: 14.1 10*3/uL — ABNORMAL HIGH (ref 1.7–7.7)
Neutrophils Relative %: 79 %
Platelets: 201 10*3/uL (ref 150–400)
RBC: 2.77 MIL/uL — ABNORMAL LOW (ref 4.22–5.81)
RDW: 15.7 % — ABNORMAL HIGH (ref 11.5–15.5)
WBC: 17.8 10*3/uL — ABNORMAL HIGH (ref 4.0–10.5)
nRBC: 0 % (ref 0.0–0.2)

## 2019-02-28 LAB — COMPREHENSIVE METABOLIC PANEL
ALT: 27 U/L (ref 0–44)
AST: 86 U/L — ABNORMAL HIGH (ref 15–41)
Albumin: 2.8 g/dL — ABNORMAL LOW (ref 3.5–5.0)
Alkaline Phosphatase: 54 U/L (ref 38–126)
Anion gap: 13 (ref 5–15)
BUN: 11 mg/dL (ref 6–20)
CO2: 18 mmol/L — ABNORMAL LOW (ref 22–32)
Calcium: 8.6 mg/dL — ABNORMAL LOW (ref 8.9–10.3)
Chloride: 100 mmol/L (ref 98–111)
Creatinine, Ser: 0.8 mg/dL (ref 0.61–1.24)
GFR calc Af Amer: >60 mL/min (ref >60)
GFR calc non Af Amer: >60 mL/min (ref >60)
Glucose, Bld: 151 mg/dL — ABNORMAL HIGH (ref 70–99)
Potassium: 4.1 mmol/L (ref 3.5–5.1)
Sodium: 131 mmol/L — ABNORMAL LOW (ref 135–145)
Total Bilirubin: 5.5 mg/dL — ABNORMAL HIGH (ref 0.3–1.2)
Total Protein: 5.7 g/dL — ABNORMAL LOW (ref 6.5–8.1)

## 2019-02-28 LAB — GLUCOSE, CAPILLARY
Glucose-Capillary: 137 mg/dL — ABNORMAL HIGH (ref 70–99)
Glucose-Capillary: 145 mg/dL — ABNORMAL HIGH (ref 70–99)
Glucose-Capillary: 142 mg/dL — ABNORMAL HIGH (ref 70–99)
Glucose-Capillary: 106 mg/dL — ABNORMAL HIGH (ref 70–99)
Glucose-Capillary: 116 mg/dL — ABNORMAL HIGH (ref 70–99)

## 2019-02-28 LAB — BASIC METABOLIC PANEL
Anion gap: 11 (ref 5–15)
BUN: 11 mg/dL (ref 6–20)
CO2: 21 mmol/L — ABNORMAL LOW (ref 22–32)
Calcium: 8.8 mg/dL — ABNORMAL LOW (ref 8.9–10.3)
Chloride: 97 mmol/L — ABNORMAL LOW (ref 98–111)
Creatinine, Ser: 0.88 mg/dL (ref 0.61–1.24)
GFR calc Af Amer: >60 mL/min (ref >60)
GFR calc non Af Amer: >60 mL/min (ref >60)
Glucose, Bld: 133 mg/dL — ABNORMAL HIGH (ref 70–99)
Potassium: 3.5 mmol/L (ref 3.5–5.1)
Sodium: 129 mmol/L — ABNORMAL LOW (ref 135–145)

## 2019-02-28 LAB — COOXEMETRY PANEL
Carboxyhemoglobin: 1.8 % — ABNORMAL HIGH (ref 0.5–1.5)
Carboxyhemoglobin: 1 % (ref 0.5–1.5)
Methemoglobin: 1.3 % (ref 0.0–1.5)
Methemoglobin: 0.9 % (ref 0.0–1.5)
O2 Saturation: 67.4 %
O2 Saturation: 61.9 %
Total hemoglobin: 8.5 g/dL — ABNORMAL LOW (ref 12.0–16.0)
Total hemoglobin: 12.3 g/dL (ref 12.0–16.0)

## 2019-02-28 LAB — BLOOD GAS, ARTERIAL
Acid-base deficit: 0.5 mmol/L (ref 0.0–2.0)
Bicarbonate: 22.6 mmol/L (ref 20.0–28.0)
FIO2: 44
O2 Saturation: 93.1 %
Patient temperature: 37
pCO2 arterial: 30 mmHg — ABNORMAL LOW (ref 32.0–48.0)
pH, Arterial: 7.488 — ABNORMAL HIGH (ref 7.350–7.450)
pO2, Arterial: 67.3 mmHg — ABNORMAL LOW (ref 83.0–108.0)

## 2019-02-28 LAB — CBC
HCT: 27.2 % — ABNORMAL LOW (ref 39.0–52.0)
Hemoglobin: 9.4 g/dL — ABNORMAL LOW (ref 13.0–17.0)
MCH: 31.2 pg (ref 26.0–34.0)
MCHC: 34.6 g/dL (ref 30.0–36.0)
MCV: 90.4 fL (ref 80.0–100.0)
Platelets: 242 10*3/uL (ref 150–400)
RBC: 3.01 MIL/uL — ABNORMAL LOW (ref 4.22–5.81)
RDW: 15 % (ref 11.5–15.5)
WBC: 20.5 10*3/uL — ABNORMAL HIGH (ref 4.0–10.5)
nRBC: 0 % (ref 0.0–0.2)

## 2019-02-28 LAB — LACTATE DEHYDROGENASE: LDH: 431 U/L — ABNORMAL HIGH (ref 98–192)

## 2019-02-28 LAB — PROTIME-INR
INR: 1.9 — ABNORMAL HIGH (ref 0.8–1.2)
Prothrombin Time: 21.6 seconds — ABNORMAL HIGH (ref 11.4–15.2)

## 2019-02-28 LAB — MAGNESIUM: Magnesium: 1.9 mg/dL (ref 1.7–2.4)

## 2019-02-28 LAB — PHOSPHORUS: Phosphorus: 3.7 mg/dL (ref 2.5–4.6)

## 2019-02-28 MED ORDER — PROMETHAZINE HCL 25 MG/ML IJ SOLN
12.5000 mg | Freq: Four times a day (QID) | INTRAMUSCULAR | Status: DC | PRN
  Administered 2019-02-28: 12.5 mg via INTRAVENOUS
  Filled 2019-02-28: qty 1

## 2019-02-28 MED ORDER — AMIODARONE HCL IN DEXTROSE 360-4.14 MG/200ML-% IV SOLN: 30.0000 mg/h | INTRAVENOUS | Status: DC

## 2019-02-28 MED ORDER — FENTANYL CITRATE (PF) 100 MCG/2ML IJ SOLN
50.0000 ug | INTRAMUSCULAR | Status: DC | PRN
  Administered 2019-02-28: 50 ug via INTRAVENOUS
  Filled 2019-02-28: qty 2

## 2019-02-28 MED ORDER — WARFARIN SODIUM 1 MG PO TABS
1.0000 mg | ORAL_TABLET | Freq: Once | ORAL | Status: AC
  Administered 2019-02-28: 1 mg via ORAL
  Filled 2019-02-28: qty 1

## 2019-02-28 MED ORDER — FUROSEMIDE 10 MG/ML IJ SOLN
10.0000 mg/h | INTRAVENOUS | Status: DC
  Administered 2019-02-28: 8 mg/h via INTRAVENOUS
  Administered 2019-03-01: 10 mg/h via INTRAVENOUS
  Filled 2019-02-28: qty 21
  Filled 2019-02-28 (×2): qty 25

## 2019-02-28 MED ORDER — WARFARIN - PHYSICIAN DOSING INPATIENT
Freq: Every day | Status: DC
  Administered 2019-03-06 – 2019-03-07 (×2): 1

## 2019-02-28 MED ORDER — LEVALBUTEROL HCL 1.25 MG/0.5ML IN NEBU
1.2500 mg | INHALATION_SOLUTION | Freq: Three times a day (TID) | RESPIRATORY_TRACT | Status: DC
  Administered 2019-02-28 – 2019-03-02 (×7): 1.25 mg via RESPIRATORY_TRACT
  Filled 2019-02-28 (×8): qty 0.5

## 2019-02-28 MED ORDER — FUROSEMIDE 10 MG/ML IJ SOLN
40.0000 mg | Freq: Once | INTRAMUSCULAR | Status: AC
  Administered 2019-02-28: 40 mg via INTRAVENOUS
  Filled 2019-02-28: qty 4

## 2019-02-28 MED ORDER — SODIUM CHLORIDE 0.9 % IV SOLN
1.0000 g | Freq: Three times a day (TID) | INTRAVENOUS | Status: DC
  Administered 2019-02-28 – 2019-03-04 (×13): 1 g via INTRAVENOUS
  Filled 2019-02-28 (×15): qty 1

## 2019-02-28 MED ORDER — OXYCODONE HCL 5 MG PO TABS
10.0000 mg | ORAL_TABLET | ORAL | Status: DC | PRN
  Administered 2019-02-28 (×2): 10 mg via ORAL
  Filled 2019-02-28 (×2): qty 2

## 2019-02-28 MED ORDER — POTASSIUM CHLORIDE 10 MEQ/50ML IV SOLN
10.0000 meq | INTRAVENOUS | Status: AC
  Administered 2019-02-28 (×3): 10 meq via INTRAVENOUS
  Filled 2019-02-28: qty 50

## 2019-02-28 MED ORDER — HYDRALAZINE HCL 25 MG PO TABS
25.0000 mg | ORAL_TABLET | Freq: Four times a day (QID) | ORAL | Status: DC
  Administered 2019-02-28 – 2019-03-02 (×9): 25 mg via ORAL
  Filled 2019-02-28 (×11): qty 1

## 2019-02-28 NOTE — Progress Notes (Signed)
Pt having N/V episodes & elevated BP (MAPS >90) discussed with Dr. Morton Peters. Verbal orders given-- Pt NPO (except sips w/ meds); add Phenergan 12.5 IV Q6 prn. Will continue to monitor.

## 2019-02-28 NOTE — Progress Notes (Signed)
Abnormal ABG called to Dr. Dorris Fetch. No new orders. Will continue to monitor.

## 2019-02-28 NOTE — Progress Notes (Signed)
MD Hendrikson paged and updated on patient situation. Hgb drop from 9.0 to 8.8. Na is 131. Wbc from 13.6 to 17.8. Nitroglycerin at max dose and MAPs are low 90s. No new orders. Will continue to monitor.

## 2019-02-28 NOTE — Progress Notes (Addendum)
HeartMate 3 Rounding Note  Subjective:    34 yo with nonischemic CM, mod RV dysfunction\and inotrope dependent Intermacs 2 cardiogenic shock  Hx ETOH, drugs, cirrhosis HM3 implant with inotrope supported RV dysfunction Operative bronchoscopy needed for secretion induced RUL collapse Coagulopathy related to liver disease, HF  LVAD INTERROGATION:  HeartMate III LVAD:  Flow 3.8 liters/min, speed 5500, power 4.8, PI 3.2.     Objective:    Vital Signs:   Temp:  [98.1 F (36.7 C)-100.8 F (38.2 C)] 99.5 F (37.5 C) (01/24 0805) Pulse Rate:  [30-125] 116 (01/24 0805) Resp:  [12-32] 28 (01/24 0805) BP: (95-111)/(74-90) 110/84 (01/24 0800) SpO2:  [91 %-100 %] 100 % (01/24 0805) Arterial Line BP: (88-111)/(71-94) 101/89 (01/24 0800) FiO2 (%):  [40 %-50 %] 40 % (01/23 1133) Weight:  [58.1 kg] 58.1 kg (01/24 0429) Last BM Date: 02/25/19 Mean arterial Pressure 80-90 mm Hg  Intake/Output:   Intake/Output Summary (Last 24 hours) at 02/28/2019 0847 Last data filed at 02/28/2019 0800 Gross per 24 hour  Intake 3338.7 ml  Output 2355 ml  Net 983.7 ml     Physical Exam: HEENT: normal  General:  Extubated, neuro intact, c/o surgical pain  ENT : JVP normal Carotids; no bruits. No lymphadenopathy or thryomegaly appreciated. Cor: Mechanical heart sounds with LVAD hum present. Lungs: clear Abdomen: soft, nontender, nondistended. No hepatosplenomegaly. No bruits or masses. Good bowel sounds. Extremities: no cyanosis, clubbing, rash, edema Neuro: alert & orientedx3, cranial nerves grossly intact. moves all 4 extremities w/o difficulty. Affect pleasant  Telemetry: nsr rate 100  Labs: Basic Metabolic Panel: Recent Labs  Lab 02/26/19 1401 02/26/19 1408 02/26/19 2016 02/26/19 2024 02/27/19 0430 02/27/19 0441 02/27/19 1150 02/27/19 1311 02/27/19 1422 02/28/19 0209 02/28/19 0426  NA 138   < > 133*   < > 135   < > 137 137 136 131* 132*  K 3.0*   < > 3.0*   < > 4.4   < > 3.5 3.5 3.4*  4.1 4.2  CL 101  --  101  --  103  --   --   --  104 100  --   CO2 26  --  23  --  23  --   --   --  21* 18*  --   GLUCOSE 121*  --  135*  --  134*  --   --   --  134* 151*  --   BUN 16  --  15  --  13  --   --   --  10 11  --   CREATININE 0.65  --  0.72  --  0.76  --   --   --  0.70 0.80  --   CALCIUM 7.8*   < > 7.9*   < > 8.2*  --   --   --  8.4* 8.6*  --   MG 1.8  --  2.6*  --  2.3  --   --   --  2.0 1.9  --   PHOS  --   --   --   --  4.0  --   --   --   --  3.7  --    < > = values in this interval not displayed.    Liver Function Tests: Recent Labs  Lab 02/22/19 1119 02/24/19 0445 02/27/19 0430 02/28/19 0209  AST 27 41 132* 86*  ALT 24 33 30 27  ALKPHOS 58 63 47 54  BILITOT 0.9 0.7 4.7* 5.5*  PROT 7.3 7.1 5.6* 5.7*  ALBUMIN 3.1* 3.0* 3.2* 2.8*   No results for input(s): LIPASE, AMYLASE in the last 168 hours. No results for input(s): AMMONIA in the last 168 hours.  CBC: Recent Labs  Lab 02/26/19 2016 02/26/19 2024 02/27/19 0226 02/27/19 0226 02/27/19 0430 02/27/19 0441 02/27/19 1150 02/27/19 1311 02/27/19 1422 02/28/19 0209 02/28/19 0426  WBC 11.2*  --  10.8*  --  11.6*  --   --   --  13.6* 17.8*  --   NEUTROABS  --   --   --   --  8.7*  --   --   --   --  14.1*  --   HGB 9.6*   < > 9.4*   < > 9.7*   < > 9.5* 9.2* 9.0* 8.8* 8.8*  HCT 28.1*   < > 28.2*   < > 28.1*   < > 28.0* 27.0* 25.3* 25.7* 26.0*  MCV 93.7  --  94.0  --  92.7  --   --   --  92.0 92.8  --   PLT 185  --  207  --  191  --   --   --  183 201  --    < > = values in this interval not displayed.    INR: Recent Labs  Lab 02/24/19 0445 02/26/19 1411 02/27/19 0430 02/28/19 0209  INR 1.1 1.3* 1.3* 1.9*    Other results:  EKG:   Imaging: DG Chest 1 View  Result Date: 02/26/2019 CLINICAL DATA:  Left ventricular assist device. EXAM: CHEST  1 VIEW COMPARISON:  02/24/2019 FINDINGS: Endotracheal tube tip noted 3 cm above the carina. Swan-Ganz catheter tip noted over the pulmonary outflow  tract. Mediastinal drainage catheter noted over the mid mediastinum. Pericardial drainage catheter noted over the left lower chest. Bilateral chest tubes noted over the upper chest. Metallic tubing noted over the right chest, this may be outside the patient. No pneumothorax. No pleural effusion. Right upper lobe atelectasis. Prior median sternotomy. Left ventricular assist device noted over the left lower chest. Stable cardiomegaly. IMPRESSION: 1. Lines and tubes including bilateral chest tubes as above. No pneumothorax. 2.  Right upper lobe atelectasis. 3. Prior median sternotomy. Left ventricular assist device noted over the left lower chest. Stable cardiomegaly. Electronically Signed   By: Maisie Fus  Register   On: 02/26/2019 13:53   DG Chest Port 1 View  Result Date: 02/28/2019 CLINICAL DATA:  LVAD EXAM: PORTABLE CHEST 1 VIEW COMPARISON:  02/27/2019 FINDINGS: Interval extubation and removal of enteric tube. Stable right IJ Swan-Ganz catheter in the right main pulmonary artery. Stable bilateral chest tubes and mediastinal drain. No pneumothorax is seen. Mild patchy opacity in the right upper lung, likely atelectasis/contusion. Left lung is essentially clear. Cardiomegaly.  LVAD. Median sternotomy. IMPRESSION: Interval extubation and removal of enteric tube. Bilateral chest tubes and mediastinal drain. No pneumothorax is seen. Stable LVAD. Electronically Signed   By: Charline Bills M.D.   On: 02/28/2019 07:21   DG Chest Port 1 View  Result Date: 02/27/2019 CLINICAL DATA:  LV assist device EXAM: PORTABLE CHEST 1 VIEW COMPARISON:  02/26/2019 FINDINGS: Left ventricular assist device unchanged. Bilateral chest tubes in place. Endotracheal tube in good position. Swan-Ganz catheter main pulmonary artery. NG tube in place stomach. No pneumothorax. Progression of left lower lobe atelectasis. Right upper lobe airspace disease also shows mild progression IMPRESSION: Support devices remain in good position.  No  pneumothorax  Progression of left lower lobe and right upper lobe airspace disease. Electronically Signed   By: Marlan Palau M.D.   On: 02/27/2019 09:42   DG Chest Port 1 View  Result Date: 02/26/2019 CLINICAL DATA:  Follow-up LVAD EXAM: PORTABLE CHEST 1 VIEW COMPARISON:  02/25/2018 FINDINGS: Left ventricular assist device is again seen and stable. Bilateral chest tubes are in noted. Nasogastric catheter, mediastinal drain, endotracheal tube and Swan-Ganz catheter are noted as well. No pneumothorax is seen. Improved aeration in the right upper lobe is noted. No sizable effusion is noted. IMPRESSION: Postoperative change with improved aeration in the right upper lobe. Tubes and lines as described above. Electronically Signed   By: Alcide Clever M.D.   On: 02/26/2019 14:05   DG Chest Portable 1 View  Result Date: 02/26/2019 CLINICAL DATA:  Status post left ventricular assist device placement. EXAM: PORTABLE CHEST 1 VIEW COMPARISON:  02/24/2019 FINDINGS: Left ventricular assist device. Large bore right internal jugular line tip at low SVC. Right internal jugular Swan-Ganz catheter tip at pulmonary outflow tract. Bilateral chest tubes. A probable right femoral line terminating at the high right atrium. Endotracheal tube terminates 4.4 cm above carina. Midline trachea. Mild cardiomegaly. No pleural fluid. Right upper lobe opacification, likely due to atelectasis. Mild pulmonary venous congestion. No pneumothorax. IMPRESSION: Right upper lobe opacification, likely due to atelectasis. Aspiration cannot be excluded. Cardiomegaly with mild pulmonary venous congestion. Appropriate position of support apparatus, without pneumothorax. Electronically Signed   By: Jeronimo Greaves M.D.   On: 02/26/2019 13:49     Medications:     Scheduled Medications: . acetaminophen  1,000 mg Oral Q6H   Or  . acetaminophen (TYLENOL) oral liquid 160 mg/5 mL  1,000 mg Per Tube Q6H  . aspirin EC  325 mg Oral Daily   Or  . aspirin   324 mg Per Tube Daily   Or  . aspirin  300 mg Rectal Daily  . bisacodyl  10 mg Oral Daily   Or  . bisacodyl  10 mg Rectal Daily  . Chlorhexidine Gluconate Cloth  6 each Topical Daily  . digoxin  0.125 mg Oral Daily  . docusate sodium  200 mg Oral Daily  . hydrALAZINE  25 mg Oral Q6H  . insulin aspart  0-24 Units Subcutaneous Q4H  . levalbuterol  1.25 mg Nebulization TID  . mouth rinse  15 mL Mouth Rinse BID  . pantoprazole  40 mg Oral Daily  . sodium chloride flush  10 mL Intravenous Q12H  . sodium chloride flush  3 mL Intravenous Q12H  . warfarin  1 mg Oral ONCE-1800  . Warfarin - Physician Dosing Inpatient   Does not apply q1800    Infusions: . sodium chloride 10 mL/hr at 02/28/19 0800  . sodium chloride    . sodium chloride 10 mL/hr at 02/28/19 0800  . amiodarone 30 mg/hr (02/28/19 0800)  . amiodarone    . cefUROXime (ZINACEF)  IV Stopped (02/28/19 0640)  . EPINEPHrine 4 mg in dextrose 5% 250 mL infusion (16 mcg/mL) 2 mcg/min (02/28/19 0800)  . famotidine (PEPCID) IV Stopped (02/27/19 0949)  . furosemide (LASIX) infusion 8 mg/hr (02/28/19 0800)  . lactated ringers    . lactated ringers 20 mL/hr at 02/28/19 0800  . milrinone 0.375 mcg/kg/min (02/28/19 0800)  . nitroGLYCERIN 150 mcg/min (02/28/19 0801)    PRN Medications: sodium chloride, fentaNYL (SUBLIMAZE) injection, hydrALAZINE, midazolam, ondansetron (ZOFRAN) IV, oxyCODONE, sodium chloride flush, sodium chloride flush, traMADol   Assessment:  Postop coagulopathy- improved\ No PI events for 24 hrs VAD parameters satisfactorywith flow 3.7, coox 67% Airway secretions/ RUL atelectasis- improved but not cleared Postop bilrubin 4.2 >> 5.4 follow Postop elevated MAP- on iv NTG Patient extubated with infiltrate RUL and rising WBC- cont IV Fortaz Plan/Discussion:   Lasix drip for increased wt Wean NO to 4ppm , wean off when MAP better controlled DC R pleural tube today, DC swan and OOB to chair Decrease epi ,  milrinone for RV support Start po apresoline Cont dig, iv amio for atrial tach now better 110 INR, bilirubin up, chest tubes better - 1 mg coumadin ordered, start full liqs  I reviewed the LVAD parameters from today, and compared the results to the patient's prior recorded data.  No programming changes were made.  The LVAD is functioning within specified parameters.  The patient performs LVAD self-test daily.  LVAD interrogation was negative for any significant power changes, alarms or PI events/speed drops.  LVAD equipment check completed and is in good working order.  Back-up equipment present.   LVAD education done on emergency procedures and precautions and reviewed exit site care.  Length of Stay: [place] III 02/28/2019, 8:47 AM

## 2019-02-28 NOTE — Evaluation (Signed)
Occupational Therapy Evaluation Patient Details Name: Jon Martin MRN: [id number] DOB: 1985/06/08 Today's Date: 02/28/2019    History of Present Illness This 34 y.o. male admitted with acute/chronic systolic HF  with cardiogenic shock.  He underwent placement of HM 3 LVAD on 02/26/2019. Post op he developed RUL PNA, as well as acute blood loss anemia.  PMH includes: h/o poly substance abuse, dilated cardiomyopathy    Clinical Impression   Pt admitted with above. He demonstrates the below listed deficits and will benefit from continued OT to maximize safety and independence with BADLs.  Pt was able to move to EOB with min guard assist, and pivot to recliner with min A.  Vitals and LVAD parameters stable during activity.   He requires mod - max A for ADLs.  He reports he lives with his mom and was independent PTA.  Anticipate good progress.  Will follow acutely       Follow Up Recommendations  No OT follow up;Supervision/Assistance - 24 hour    Equipment Recommendations  None recommended by OT    Recommendations for Other Services       Precautions / Restrictions Precautions Precautions: Sternal;Other (comment)(LVAD) Precaution Booklet Issued: No Precaution Comments: Pt verbally instructed in sternal precautions  Restrictions Weight Bearing Restrictions: No      Mobility Bed Mobility Overal bed mobility: Needs Assistance Bed Mobility: Supine to Sit     Supine to sit: Min guard;HOB elevated     General bed mobility comments: min guard for safety and to manage lines   Transfers Overall transfer level: Needs assistance Equipment used: 1 person hand held assist Transfers: Sit to/from [facility] Sit to Stand: Min assist Stand pivot transfers: Min assist       General transfer comment: min A to steady     Balance Overall balance assessment: Needs assistance Sitting-balance support: Feet supported Sitting balance-Leahy Scale: Fair     Standing  balance support: No upper extremity supported Standing balance-Leahy Scale: Poor Standing balance comment: requires min A                            ADL either performed or assessed with clinical judgement   ADL Overall ADL's : Needs assistance/impaired Eating/Feeding: Set up;Sitting   Grooming: Wash/dry hands;Wash/dry face;Oral care;Set up;Sitting   Upper Body Bathing: Maximal assistance;Sitting   Lower Body Bathing: Maximal assistance;Sit to/from stand   Upper Body Dressing : Maximal assistance;Sitting   Lower Body Dressing: Maximal assistance;Sit to/from stand   Toilet Transfer: Minimal assistance;+2 for safety/equipment;Stand-pivot   Toileting- Clothing Manipulation and Hygiene: Total assistance;Sit to/from stand       Functional mobility during ADLs: Minimal assistance;+2 for safety/equipment       Vision         Perception     Praxis      Pertinent Vitals/Pain Pain Assessment: Faces Faces Pain Scale: Hurts little more Pain Location: chest  Pain Descriptors / Indicators: Discomfort;Guarding Pain Intervention(s): Monitored during session     Hand Dominance Right   Extremity/Trunk Assessment Upper Extremity Assessment Upper Extremity Assessment: Generalized weakness   Lower Extremity Assessment Lower Extremity Assessment: Defer to PT evaluation       Communication Communication Communication: No difficulties   Cognition Arousal/Alertness: Awake/alert Behavior During Therapy: WFL for tasks assessed/performed Overall Cognitive Status: Within Functional Limits for tasks assessed  General Comments: grossly assessed    General Comments  RN present to assist with lines.  VSS, LVAD parameters stable during activity     Exercises     Shoulder Instructions      Home Living Family/patient expects to be discharged to:: Private residence Living Arrangements: Parent Available Help at Discharge:  Family;Available 24 hours/day Type of Home: House Home Access: Stairs to enter [employer] of Steps: 3   Home Layout: One level     Bathroom Shower/Tub: Chief Strategy Officer: Standard     Home Equipment: None          Prior Functioning/Environment Level of Independence: Independent                 OT Problem List: Decreased strength;Impaired balance (sitting and/or standing);Decreased activity tolerance;Decreased safety awareness;Decreased knowledge of precautions;Decreased knowledge of use of DME or AE;Cardiopulmonary status limiting activity;Pain      OT Treatment/Interventions: Self-care/ADL training;Energy conservation;DME and/or AE instruction;Therapeutic activities;Patient/family education;Balance training    OT Goals(Current goals can be found in the care plan section) Acute Rehab OT Goals Patient Stated Goal: to get better  OT Goal Formulation: With patient Time For Goal Achievement: 03/14/19 Potential to Achieve Goals: Good ADL Goals Pt Will Perform Grooming: with supervision;standing Pt Will Perform Upper Body Bathing: with set-up;sitting Pt Will Perform Lower Body Bathing: with supervision;sit to/from stand Pt Will Perform Upper Body Dressing: with set-up;sitting Pt Will Perform Lower Body Dressing: with supervision;sit to/from stand Pt Will Transfer to Toilet: with supervision;ambulating;regular height toilet;grab bars Pt Will Perform Toileting - Clothing Manipulation and hygiene: with supervision;sit to/from stand Additional ADL Goal #1: Pt will be able to independently manage LVAD equipment during ADLs  OT Frequency: Min 2X/week   Barriers to D/C:            Co-evaluation              AM-PAC OT "6 Clicks" Daily Activity     Outcome Measure Help from another person eating meals?: None Help from another person taking care of personal grooming?: A Little Help from another person toileting, which includes using toliet,  bedpan, or urinal?: A Lot Help from another person bathing (including washing, rinsing, drying)?: A Lot Help from another person to put on and taking off regular upper body clothing?: A Lot Help from another person to put on and taking off regular lower body clothing?: A Lot 6 Click Score: 15   End of Session Equipment Utilized During Treatment: Oxygen Nurse Communication: Mobility status  Activity Tolerance: Patient limited by fatigue Patient left: in chair;with call bell/phone within reach;with nursing/sitter in room  OT Visit Diagnosis: Unsteadiness on feet (R26.81);Pain                Time: 3748-2707 OT Time Calculation (min): 24 min Charges:  OT General Charges $OT Visit: 1 Visit OT Evaluation $OT Eval High Complexity: 1 High OT Treatments $Therapeutic Activity: 8-22 mins  Eber Jones., OTR/L Acute Rehabilitation Services Pager [phone] Office [phone]   Jeani Hawking M 02/28/2019, 6:34 PM

## 2019-02-28 NOTE — Progress Notes (Signed)
LVAD drive line dressing changed performed by this RN maintaining sterile technique throughout. Site clean with no drainage, stitches intact, and velour incorporated. Very slight erythema noted. Site and surrounding skin cleaned and silver gauze strip placed around drive line at entry site. Occlusive gauze dressing placed and drive line anchored on left hip and lateral left thigh.   Pt educated on importance of beginning to learn VAD management and of anchoring/mindfulness of drive line.   Next dressing change due 03/01/19 by either VAD coordinator or nurse champion.

## 2019-02-28 NOTE — Progress Notes (Signed)
Patient ID: Jon Martin, male   DOB: September 07, 1985, 34 y.o.   MRN: [id number]     Advanced Heart Failure Rounding Note  PCP-Cardiologist: No primary care provider on file.   Subjective:    34 y.o. 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  Underwent HM-3 placement 02/26/2019.  Extubated 1/23  On milrinone 0.375, epi 2   Very weak. Chest sore.   CXR with RUL infiltrate with rising WBC. Ceftaz started this am.   ABG 7.06/30/55/92%  Co-ox 67%   Swan# CVP 11 PA 30/13 (21) Thermo 5.0/3.1  LVAD Interrogation HM 3: Speed: 5500 Flow: 3.5 PI: 5.0 Power: 4.0 VAD interrogated personally. Parameters stable.  Objective:   Weight Range: 58.1 kg Body mass index is 19.48 kg/m.   Vital Signs:   Temp:  [98.1 F (36.7 C)-100.8 F (38.2 C)] 99.9 F (37.7 C) (01/24 1000) Pulse Rate:  [30-125] 55 (01/24 1000) Resp:  [18-32] 32 (01/24 1000) BP: (95-111)/(76-90) 110/84 (01/24 0800) SpO2:  [91 %-100 %] 99 % (01/24 1000) Arterial Line BP: (88-115)/(71-94) 99/86 (01/24 1000) FiO2 (%):  [40 %] 40 % (01/23 1133) Weight:  [58.1 kg] 58.1 kg (01/24 0429) Last BM Date: 02/25/19  Weight change: Filed Weights   02/26/19 0500 02/27/19 0500 02/28/19 0429  Weight: 42 kg 52.8 kg 58.1 kg    Intake/Output:   Intake/Output Summary (Last 24 hours) at 02/28/2019 1041 Last data filed at 02/28/2019 1000 Gross per 24 hour  Intake 3307.32 ml  Output 2555 ml  Net 752.32 ml      Physical Exam   General:  Very weak. Curled up in bed  HEENT: normal  Neck: supple. JVP to jaw RJ swan  Carotids 2+ bilat; no bruits. No lymphadenopathy or thryomegaly appreciated. Cor: Sternal dressing LVAD hum.  Lungs: decreased air movement  Abdomen:  soft, nontender, + distended. No hepatosplenomegaly. No bruits or masses. Hypoactive bowel sounds. Driveline site clean. Anchor in place.  Extremities: no cyanosis, clubbing, rash. 1+ edema  Neuro: weak nonfocal   Telemetry  Sinus 110-120 +  PVCs Personally reviewed  Labs    CBC Recent Labs    02/27/19 0430 02/27/19 0441 02/27/19 1422 02/27/19 1422 02/28/19 0209 02/28/19 0426  WBC 11.6*   < > 13.6*  --  17.8*  --   NEUTROABS 8.7*  --   --   --  14.1*  --   HGB 9.7*   < > 9.0*   < > 8.8* 8.8*  HCT 28.1*   < > 25.3*   < > 25.7* 26.0*  MCV 92.7   < > 92.0  --  92.8  --   PLT 191   < > 183  --  201  --    < > = values in this interval not displayed.   Basic Metabolic Panel Recent Labs    15/40/08 0430 02/27/19 0441 02/27/19 1422 02/27/19 1422 02/28/19 0209 02/28/19 0426  NA 135   < > 136   < > 131* 132*  K 4.4   < > 3.4*   < > 4.1 4.2  CL 103   < > 104  --  100  --   CO2 23   < > 21*  --  18*  --   GLUCOSE 134*   < > 134*  --  151*  --   BUN 13   < > 10  --  11  --   CREATININE 0.76   < >  0.70  --  0.80  --   CALCIUM 8.2*   < > 8.4*  --  8.6*  --   MG 2.3   < > 2.0  --  1.9  --   PHOS 4.0  --   --   --  3.7  --    < > = values in this interval not displayed.   Liver Function Tests Recent Labs    02/27/19 0430 02/28/19 0209  AST 132* 86*  ALT 30 27  ALKPHOS 47 54  BILITOT 4.7* 5.5*  PROT 5.6* 5.7*  ALBUMIN 3.2* 2.8*   No results for input(s): LIPASE, AMYLASE in the last 72 hours. Cardiac Enzymes No results for input(s): CKTOTAL, CKMB, CKMBINDEX, TROPONINI in the last 72 hours.  BNP: BNP (last 3 results) Recent Labs    02/10/19 0219 02/27/19 0433  BNP 2,048.6* 447.0*    ProBNP (last 3 results) No results for input(s): PROBNP in the last 8760 hours.   D-Dimer Recent Labs    02/26/19 1411  DDIMER 1.74*   Hemoglobin A1C No results for input(s): HGBA1C in the last 72 hours. Fasting Lipid Panel No results for input(s): CHOL, HDL, LDLCALC, TRIG, CHOLHDL, LDLDIRECT in the last 72 hours. Thyroid Function Tests No results for input(s): TSH, T4TOTAL, T3FREE, THYROIDAB in the last 72 hours.  Invalid input(s): FREET3  Other results:   Imaging    DG Chest Port 1 View  Result  Date: 02/28/2019 CLINICAL DATA:  LVAD EXAM: PORTABLE CHEST 1 VIEW COMPARISON:  02/27/2019 FINDINGS: Interval extubation and removal of enteric tube. Stable right IJ Swan-Ganz catheter in the right main pulmonary artery. Stable bilateral chest tubes and mediastinal drain. No pneumothorax is seen. Mild patchy opacity in the right upper lung, likely atelectasis/contusion. Left lung is essentially clear. Cardiomegaly.  LVAD. Median sternotomy. IMPRESSION: Interval extubation and removal of enteric tube. Bilateral chest tubes and mediastinal drain. No pneumothorax is seen. Stable LVAD. Electronically Signed   By: Charline Bills M.D.   On: 02/28/2019 07:21     Medications:     Scheduled Medications: . acetaminophen  1,000 mg Oral Q6H   Or  . acetaminophen (TYLENOL) oral liquid 160 mg/5 mL  1,000 mg Per Tube Q6H  . aspirin EC  325 mg Oral Daily   Or  . aspirin  324 mg Per Tube Daily   Or  . aspirin  300 mg Rectal Daily  . bisacodyl  10 mg Oral Daily   Or  . bisacodyl  10 mg Rectal Daily  . Chlorhexidine Gluconate Cloth  6 each Topical Daily  . digoxin  0.125 mg Oral Daily  . docusate sodium  200 mg Oral Daily  . hydrALAZINE  25 mg Oral Q6H  . insulin aspart  0-24 Units Subcutaneous Q4H  . levalbuterol  1.25 mg Nebulization TID  . mouth rinse  15 mL Mouth Rinse BID  . pantoprazole  40 mg Oral Daily  . sodium chloride flush  10 mL Intravenous Q12H  . sodium chloride flush  3 mL Intravenous Q12H  . warfarin  1 mg Oral ONCE-1800  . Warfarin - Physician Dosing Inpatient   Does not apply q1800    Infusions: . sodium chloride Stopped (02/28/19 0856)  . sodium chloride    . sodium chloride 10 mL/hr at 02/28/19 1000  . amiodarone 30 mg/hr (02/28/19 1000)  . cefTAZidime (FORTAZ)  IV 1 g (02/28/19 1000)  . EPINEPHrine 4 mg in dextrose 5% 250 mL infusion (16 mcg/mL)  2 mcg/min (02/28/19 1000)  . famotidine (PEPCID) IV Stopped (02/28/19 0949)  . furosemide (LASIX) infusion 8 mg/hr (02/28/19  1000)  . lactated ringers    . lactated ringers 20 mL/hr at 02/28/19 1000  . milrinone 0.375 mcg/kg/min (02/28/19 1000)  . nitroGLYCERIN 120 mcg/min (02/28/19 1000)    PRN Medications: sodium chloride, fentaNYL (SUBLIMAZE) injection, hydrALAZINE, midazolam, ondansetron (ZOFRAN) IV, oxyCODONE, sodium chloride flush, sodium chloride flush, traMADol     Assessment/Plan   1. Acute/Chronic Systolic HF -> cardiogenic shock - ECHO at [facility] EF, 20%.  - ECHO repeated and showed severely reduced EF <20%. RV only mildly down - R/L cath 1/7 normal cors. Output ok   - s/p HM-3 on 02/26/2019 - On milrinone 0.375 and epi 2. Co-ox 67% - VAD interrogated personally. Parameters stable. Only 1 PI event in last 24 hours - CVP 11-12. Weight up 20 pounds. Agree with diuresis. Now on lasix gtt  2. VAD -  s/p HM-3 on 02/26/2019 -  RV down some -  VAD interrogated personally. Parameters stable. -  Add sildenafil as more stable - LDH 418 -> 431 - Warfarin started  3. Acute post-op respiratory failure with RUL PNA - Extubated 1/23 - Now with RUL PNA. Ceftaz started 1/24 - Add vanc as needed - Will get sputum cx - Encourage IS  4. Acute blood loss anemia - keep HGB > 8.0  5. Elevated bilirubin - 0.7 -> 4.7 -> 5.5 - likely combination of RV failure and underlying liver disease - hopefully will improved with diuresis and liver decongestion.   CRITICAL CARE Performed by: Glori Bickers  Total critical care time: 335 minutes  Critical care time was exclusive of separately billable procedures and treating other patients.  Critical care was necessary to treat or prevent imminent or life-threatening deterioration.  Critical care was time spent personally by me (independent of midlevel providers or residents) on the following activities: development of treatment plan with patient and/or surrogate as well as nursing, discussions with consultants, evaluation of patient's response to treatment,  examination of patient, obtaining history from patient or surrogate, ordering and performing treatments and interventions, ordering and review of laboratory studies, ordering and review of radiographic studies, pulse oximetry and re-evaluation of patient's condition.    Length of Stay: [place], MD  02/28/2019, 10:41 AM  Advanced Heart Failure Team Pager [phone] (M-F; 7a - 4p)  Please contact [facility] for night-coverage after hours (4p -7a ) and weekends on [email or web address]

## 2019-03-01 ENCOUNTER — Inpatient Hospital Stay: Payer: Self-pay

## 2019-03-01 ENCOUNTER — Inpatient Hospital Stay (HOSPITAL_COMMUNITY): Payer: Medicaid - Out of State

## 2019-03-01 ENCOUNTER — Encounter: Payer: Self-pay | Admitting: *Deleted

## 2019-03-01 LAB — CBC WITH DIFFERENTIAL/PLATELET
Abs Immature Granulocytes: 0.18 10*3/uL — ABNORMAL HIGH (ref 0.00–0.07)
Basophils Absolute: 0 10*3/uL (ref 0.0–0.1)
Basophils Relative: 0 %
Eosinophils Absolute: 0.1 10*3/uL (ref 0.0–0.5)
Eosinophils Relative: 0 %
HCT: 25.3 % — ABNORMAL LOW (ref 39.0–52.0)
Hemoglobin: 8.9 g/dL — ABNORMAL LOW (ref 13.0–17.0)
Immature Granulocytes: 1 %
Lymphocytes Relative: 8 %
Lymphs Abs: 1.5 10*3/uL (ref 0.7–4.0)
MCH: 31.7 pg (ref 26.0–34.0)
MCHC: 35.2 g/dL (ref 30.0–36.0)
MCV: 90 fL (ref 80.0–100.0)
Monocytes Absolute: 2.4 10*3/uL — ABNORMAL HIGH (ref 0.1–1.0)
Monocytes Relative: 13 %
Neutro Abs: 14.7 10*3/uL — ABNORMAL HIGH (ref 1.7–7.7)
Neutrophils Relative %: 78 %
Platelets: 236 10*3/uL (ref 150–400)
RBC: 2.81 MIL/uL — ABNORMAL LOW (ref 4.22–5.81)
RDW: 14.8 % (ref 11.5–15.5)
WBC: 18.8 10*3/uL — ABNORMAL HIGH (ref 4.0–10.5)
nRBC: 0 % (ref 0.0–0.2)

## 2019-03-01 LAB — POCT I-STAT 7, (LYTES, BLD GAS, ICA,H+H)
Acid-Base Excess: 1 mmol/L (ref 0.0–2.0)
Acid-Base Excess: 2 mmol/L (ref 0.0–2.0)
Acid-Base Excess: 2 mmol/L (ref 0.0–2.0)
Acid-Base Excess: 3 mmol/L — ABNORMAL HIGH (ref 0.0–2.0)
Bicarbonate: 27.5 mmol/L (ref 20.0–28.0)
Bicarbonate: 27.8 mmol/L (ref 20.0–28.0)
Bicarbonate: 23.7 mmol/L (ref 20.0–28.0)
Bicarbonate: 25 mmol/L (ref 20.0–28.0)
Calcium, Ion: 1.33 mmol/L (ref 1.15–1.40)
Calcium, Ion: 1.08 mmol/L — ABNORMAL LOW (ref 1.15–1.40)
Calcium, Ion: 1.14 mmol/L — ABNORMAL LOW (ref 1.15–1.40)
Calcium, Ion: 1.09 mmol/L — ABNORMAL LOW (ref 1.15–1.40)
HCT: 34 % — ABNORMAL LOW (ref 39.0–52.0)
HCT: 31 % — ABNORMAL LOW (ref 39.0–52.0)
HCT: 27 % — ABNORMAL LOW (ref 39.0–52.0)
HCT: 26 % — ABNORMAL LOW (ref 39.0–52.0)
Hemoglobin: 11.6 g/dL — ABNORMAL LOW (ref 13.0–17.0)
Hemoglobin: 10.5 g/dL — ABNORMAL LOW (ref 13.0–17.0)
Hemoglobin: 9.2 g/dL — ABNORMAL LOW (ref 13.0–17.0)
Hemoglobin: 8.8 g/dL — ABNORMAL LOW (ref 13.0–17.0)
O2 Saturation: 100 %
O2 Saturation: 100 %
O2 Saturation: 89 %
O2 Saturation: 98 %
Patient temperature: 98.4
Patient temperature: 97.8
Potassium: 3.7 mmol/L (ref 3.5–5.1)
Potassium: 3.1 mmol/L — ABNORMAL LOW (ref 3.5–5.1)
Potassium: 3.8 mmol/L (ref 3.5–5.1)
Potassium: 4.3 mmol/L (ref 3.5–5.1)
Sodium: 138 mmol/L (ref 135–145)
Sodium: 139 mmol/L (ref 135–145)
Sodium: 128 mmol/L — ABNORMAL LOW (ref 135–145)
Sodium: 127 mmol/L — ABNORMAL LOW (ref 135–145)
TCO2: 29 mmol/L (ref 22–32)
TCO2: 29 mmol/L (ref 22–32)
TCO2: 24 mmol/L (ref 22–32)
TCO2: 26 mmol/L (ref 22–32)
pCO2 arterial: 51.3 mmHg — ABNORMAL HIGH (ref 32.0–48.0)
pCO2 arterial: 48.7 mmHg — ABNORMAL HIGH (ref 32.0–48.0)
pCO2 arterial: 26.5 mmHg — ABNORMAL LOW (ref 32.0–48.0)
pCO2 arterial: 27.4 mmHg — ABNORMAL LOW (ref 32.0–48.0)
pH, Arterial: 7.337 — ABNORMAL LOW (ref 7.350–7.450)
pH, Arterial: 7.365 (ref 7.350–7.450)
pH, Arterial: 7.559 — ABNORMAL HIGH (ref 7.350–7.450)
pH, Arterial: 7.566 — ABNORMAL HIGH (ref 7.350–7.450)
pO2, Arterial: 286 mmHg — ABNORMAL HIGH (ref 83.0–108.0)
pO2, Arterial: 290 mmHg — ABNORMAL HIGH (ref 83.0–108.0)
pO2, Arterial: 48 mmHg — ABNORMAL LOW (ref 83.0–108.0)
pO2, Arterial: 82 mmHg — ABNORMAL LOW (ref 83.0–108.0)

## 2019-03-01 LAB — POCT I-STAT, CHEM 8
BUN: 20 mg/dL (ref 6–20)
BUN: 20 mg/dL (ref 6–20)
BUN: 17 mg/dL (ref 6–20)
BUN: 17 mg/dL (ref 6–20)
BUN: 18 mg/dL (ref 6–20)
Calcium, Ion: 1.33 mmol/L (ref 1.15–1.40)
Calcium, Ion: 1.23 mmol/L (ref 1.15–1.40)
Calcium, Ion: 1.03 mmol/L — ABNORMAL LOW (ref 1.15–1.40)
Calcium, Ion: 0.98 mmol/L — ABNORMAL LOW (ref 1.15–1.40)
Calcium, Ion: 1.06 mmol/L — ABNORMAL LOW (ref 1.15–1.40)
Chloride: 100 mmol/L (ref 98–111)
Chloride: 101 mmol/L (ref 98–111)
Chloride: 98 mmol/L (ref 98–111)
Chloride: 96 mmol/L — ABNORMAL LOW (ref 98–111)
Chloride: 96 mmol/L — ABNORMAL LOW (ref 98–111)
Creatinine, Ser: 0.6 mg/dL — ABNORMAL LOW (ref 0.61–1.24)
Creatinine, Ser: 0.6 mg/dL — ABNORMAL LOW (ref 0.61–1.24)
Creatinine, Ser: 0.5 mg/dL — ABNORMAL LOW (ref 0.61–1.24)
Creatinine, Ser: 0.5 mg/dL — ABNORMAL LOW (ref 0.61–1.24)
Creatinine, Ser: 0.5 mg/dL — ABNORMAL LOW (ref 0.61–1.24)
Glucose, Bld: 109 mg/dL — ABNORMAL HIGH (ref 70–99)
Glucose, Bld: 174 mg/dL — ABNORMAL HIGH (ref 70–99)
Glucose, Bld: 177 mg/dL — ABNORMAL HIGH (ref 70–99)
Glucose, Bld: 161 mg/dL — ABNORMAL HIGH (ref 70–99)
Glucose, Bld: 164 mg/dL — ABNORMAL HIGH (ref 70–99)
HCT: 30 % — ABNORMAL LOW (ref 39.0–52.0)
HCT: 32 % — ABNORMAL LOW (ref 39.0–52.0)
HCT: 25 % — ABNORMAL LOW (ref 39.0–52.0)
HCT: 21 % — ABNORMAL LOW (ref 39.0–52.0)
HCT: 27 % — ABNORMAL LOW (ref 39.0–52.0)
Hemoglobin: 10.2 g/dL — ABNORMAL LOW (ref 13.0–17.0)
Hemoglobin: 10.9 g/dL — ABNORMAL LOW (ref 13.0–17.0)
Hemoglobin: 8.5 g/dL — ABNORMAL LOW (ref 13.0–17.0)
Hemoglobin: 7.1 g/dL — ABNORMAL LOW (ref 13.0–17.0)
Hemoglobin: 9.2 g/dL — ABNORMAL LOW (ref 13.0–17.0)
Potassium: 3.6 mmol/L (ref 3.5–5.1)
Potassium: 4 mmol/L (ref 3.5–5.1)
Potassium: 3.9 mmol/L (ref 3.5–5.1)
Potassium: 3.1 mmol/L — ABNORMAL LOW (ref 3.5–5.1)
Potassium: 3.1 mmol/L — ABNORMAL LOW (ref 3.5–5.1)
Sodium: 137 mmol/L (ref 135–145)
Sodium: 134 mmol/L — ABNORMAL LOW (ref 135–145)
Sodium: 136 mmol/L (ref 135–145)
Sodium: 137 mmol/L (ref 135–145)
Sodium: 138 mmol/L (ref 135–145)
TCO2: 31 mmol/L (ref 22–32)
TCO2: 27 mmol/L (ref 22–32)
TCO2: 30 mmol/L (ref 22–32)
TCO2: 29 mmol/L (ref 22–32)
TCO2: 32 mmol/L (ref 22–32)

## 2019-03-01 LAB — CBC
HCT: 25 % — ABNORMAL LOW (ref 39.0–52.0)
Hemoglobin: 8.7 g/dL — ABNORMAL LOW (ref 13.0–17.0)
MCH: 31.3 pg (ref 26.0–34.0)
MCHC: 34.8 g/dL (ref 30.0–36.0)
MCV: 89.9 fL (ref 80.0–100.0)
Platelets: 255 10*3/uL (ref 150–400)
RBC: 2.78 MIL/uL — ABNORMAL LOW (ref 4.22–5.81)
RDW: 14.9 % (ref 11.5–15.5)
WBC: 18.5 10*3/uL — ABNORMAL HIGH (ref 4.0–10.5)
nRBC: 0 % (ref 0.0–0.2)

## 2019-03-01 LAB — COMPREHENSIVE METABOLIC PANEL
ALT: 28 U/L (ref 0–44)
AST: 54 U/L — ABNORMAL HIGH (ref 15–41)
Albumin: 2.7 g/dL — ABNORMAL LOW (ref 3.5–5.0)
Alkaline Phosphatase: 91 U/L (ref 38–126)
Anion gap: 15 (ref 5–15)
BUN: 13 mg/dL (ref 6–20)
CO2: 22 mmol/L (ref 22–32)
Calcium: 8.8 mg/dL — ABNORMAL LOW (ref 8.9–10.3)
Chloride: 92 mmol/L — ABNORMAL LOW (ref 98–111)
Creatinine, Ser: 0.93 mg/dL (ref 0.61–1.24)
GFR calc Af Amer: >60 mL/min (ref >60)
GFR calc non Af Amer: >60 mL/min (ref >60)
Glucose, Bld: 114 mg/dL — ABNORMAL HIGH (ref 70–99)
Potassium: 3.4 mmol/L — ABNORMAL LOW (ref 3.5–5.1)
Sodium: 129 mmol/L — ABNORMAL LOW (ref 135–145)
Total Bilirubin: 5 mg/dL — ABNORMAL HIGH (ref 0.3–1.2)
Total Protein: 6.1 g/dL — ABNORMAL LOW (ref 6.5–8.1)

## 2019-03-01 LAB — BLOOD GAS, ARTERIAL
Acid-Base Excess: 1.3 mmol/L (ref 0.0–2.0)
Bicarbonate: 24.1 mmol/L (ref 20.0–28.0)
Drawn by: 53675
FIO2: 21
O2 Saturation: 99.1 %
Patient temperature: 36.8
pCO2 arterial: 29.2 mmHg — ABNORMAL LOW (ref 32.0–48.0)
pH, Arterial: 7.525 — ABNORMAL HIGH (ref 7.350–7.450)
pO2, Arterial: 121 mmHg — ABNORMAL HIGH (ref 83.0–108.0)

## 2019-03-01 LAB — BASIC METABOLIC PANEL
Anion gap: 16 — ABNORMAL HIGH (ref 5–15)
BUN: 16 mg/dL (ref 6–20)
CO2: 21 mmol/L — ABNORMAL LOW (ref 22–32)
Calcium: 8.5 mg/dL — ABNORMAL LOW (ref 8.9–10.3)
Chloride: 91 mmol/L — ABNORMAL LOW (ref 98–111)
Creatinine, Ser: 1.07 mg/dL (ref 0.61–1.24)
GFR calc Af Amer: >60 mL/min (ref >60)
GFR calc non Af Amer: >60 mL/min (ref >60)
Glucose, Bld: 118 mg/dL — ABNORMAL HIGH (ref 70–99)
Potassium: 3.3 mmol/L — ABNORMAL LOW (ref 3.5–5.1)
Sodium: 128 mmol/L — ABNORMAL LOW (ref 135–145)

## 2019-03-01 LAB — COOXEMETRY PANEL
Carboxyhemoglobin: 1.6 % — ABNORMAL HIGH (ref 0.5–1.5)
Carboxyhemoglobin: 1.2 % (ref 0.5–1.5)
Methemoglobin: 1.2 % (ref 0.0–1.5)
Methemoglobin: 0.6 % (ref 0.0–1.5)
O2 Saturation: 61.4 %
O2 Saturation: 73.4 %
Total hemoglobin: 9 g/dL — ABNORMAL LOW (ref 12.0–16.0)
Total hemoglobin: 8.5 g/dL — ABNORMAL LOW (ref 12.0–16.0)

## 2019-03-01 LAB — GLUCOSE, CAPILLARY
Glucose-Capillary: 102 mg/dL — ABNORMAL HIGH (ref 70–99)
Glucose-Capillary: 107 mg/dL — ABNORMAL HIGH (ref 70–99)
Glucose-Capillary: 108 mg/dL — ABNORMAL HIGH (ref 70–99)
Glucose-Capillary: 115 mg/dL — ABNORMAL HIGH (ref 70–99)
Glucose-Capillary: 121 mg/dL — ABNORMAL HIGH (ref 70–99)
Glucose-Capillary: 94 mg/dL (ref 70–99)

## 2019-03-01 LAB — LACTATE DEHYDROGENASE: LDH: 412 U/L — ABNORMAL HIGH (ref 98–192)

## 2019-03-01 LAB — SURGICAL PATHOLOGY

## 2019-03-01 LAB — PROTIME-INR
INR: 1.5 — ABNORMAL HIGH (ref 0.8–1.2)
Prothrombin Time: 18.1 seconds — ABNORMAL HIGH (ref 11.4–15.2)

## 2019-03-01 LAB — HEPARIN LEVEL (UNFRACTIONATED): Heparin Unfractionated: <0.1 IU/mL — ABNORMAL LOW (ref 0.30–0.70)

## 2019-03-01 LAB — PHOSPHORUS: Phosphorus: 4.7 mg/dL — ABNORMAL HIGH (ref 2.5–4.6)

## 2019-03-01 LAB — MAGNESIUM: Magnesium: 1.8 mg/dL (ref 1.7–2.4)

## 2019-03-01 MED ORDER — HEPARIN (PORCINE) 25000 UT/250ML-% IV SOLN
500.0000 [IU]/h | INTRAVENOUS | Status: DC
  Administered 2019-03-01: 500 [IU]/h via INTRAVENOUS
  Filled 2019-03-01: qty 250

## 2019-03-01 MED ORDER — SODIUM CHLORIDE 0.9% FLUSH
10.0000 mL | Freq: Two times a day (BID) | INTRAVENOUS | Status: DC
  Administered 2019-03-01: 10 mL

## 2019-03-01 MED ORDER — POTASSIUM CHLORIDE 10 MEQ/50ML IV SOLN
10.0000 meq | INTRAVENOUS | Status: DC
  Administered 2019-03-01 (×2): 10 meq via INTRAVENOUS
  Filled 2019-03-01 (×3): qty 50

## 2019-03-01 MED ORDER — SPIRONOLACTONE 12.5 MG HALF TABLET
12.5000 mg | ORAL_TABLET | Freq: Every day | ORAL | Status: DC
  Administered 2019-03-01 – 2019-03-09 (×9): 12.5 mg via ORAL
  Filled 2019-03-01 (×9): qty 1

## 2019-03-01 MED ORDER — POTASSIUM CHLORIDE 10 MEQ/50ML IV SOLN: 10.0000 meq | INTRAVENOUS | Status: DC

## 2019-03-01 MED ORDER — OXYCODONE HCL 5 MG PO TABS
5.0000 mg | ORAL_TABLET | ORAL | Status: DC | PRN
  Administered 2019-03-04 – 2019-03-05 (×2): 5 mg via ORAL
  Filled 2019-03-01 (×2): qty 1

## 2019-03-01 MED ORDER — POTASSIUM CHLORIDE 10 MEQ/50ML IV SOLN
10.0000 meq | INTRAVENOUS | Status: AC
  Administered 2019-03-01 – 2019-03-02 (×4): 10 meq via INTRAVENOUS
  Filled 2019-03-01 (×3): qty 50

## 2019-03-01 MED ORDER — POTASSIUM CHLORIDE 10 MEQ/50ML IV SOLN
10.0000 meq | INTRAVENOUS | Status: AC
  Administered 2019-03-01 (×3): 10 meq via INTRAVENOUS
  Filled 2019-03-01: qty 50

## 2019-03-01 MED ORDER — MILRINONE LACTATE IN DEXTROSE 20-5 MG/100ML-% IV SOLN
0.3750 ug/kg/min | INTRAVENOUS | Status: DC
  Administered 2019-03-01 (×2): 0.375 ug/kg/min via INTRAVENOUS
  Filled 2019-03-01 (×3): qty 100

## 2019-03-01 MED ORDER — WARFARIN SODIUM 2 MG PO TABS
4.0000 mg | ORAL_TABLET | Freq: Every day | ORAL | Status: DC
  Administered 2019-03-01: 4 mg via ORAL
  Filled 2019-03-01 (×2): qty 2

## 2019-03-01 MED ORDER — HEPARIN (PORCINE) 25000 UT/250ML-% IV SOLN
600.0000 [IU]/h | INTRAVENOUS | Status: DC
  Administered 2019-03-02: 400 [IU]/h via INTRAVENOUS
  Filled 2019-03-01 (×2): qty 250

## 2019-03-01 MED ORDER — LIDOCAINE HCL (PF) 1 % IJ SOLN
INTRAMUSCULAR | Status: AC
  Administered 2019-03-01: 30 mL
  Filled 2019-03-01: qty 30

## 2019-03-01 MED ORDER — FENTANYL CITRATE (PF) 100 MCG/2ML IJ SOLN
INTRAMUSCULAR | Status: AC
  Administered 2019-03-01: 100 ug
  Filled 2019-03-01: qty 2

## 2019-03-01 MED ORDER — SODIUM CHLORIDE 0.9% FLUSH: 10.0000 mL | INTRAVENOUS | Status: DC | PRN

## 2019-03-01 NOTE — Progress Notes (Signed)
Nitric turned off at this time. Pt on a 5L [place] vital signs are stable. RT will continue to monitor pt.

## 2019-03-01 NOTE — Op Note (Signed)
20 F Right chest tube placed for 25% R pneumothorax on am CXR and drop in O2 sats  Proper timeout performed  Local anesthesia 1% lidocaine 12 cc Air expressed from pleural space during procedure Tube placed to 20 cm suction CXR pending

## 2019-03-01 NOTE — Progress Notes (Addendum)
ANTICOAGULATION CONSULT NOTE   Pharmacy Consult for heparin Indication: LVAD  No Known Allergies  Patient Measurements: Height: 5\' 8"  (172.7 cm) Weight: 128 lb 1.4 oz (58.1 kg) IBW/kg (Calculated) : 68.4  Vital Signs: Temp: 98.2 F (36.8 C) (01/25 1946) Temp Source: Oral (01/25 1946) BP: 98/80 (01/25 1752) Pulse Rate: 50 (01/25 1900)  Labs: Recent Labs    02/27/19 0430 02/27/19 0441 02/28/19 0209 02/28/19 0426 02/28/19 1609 02/28/19 1609 03/01/19 0332 03/01/19 0332 03/01/19 0644 03/01/19 0644 03/01/19 0846 03/01/19 1300 03/01/19 1727  HGB 9.7*   < > 8.8*   < > 9.4*   < > 8.9*   < > 9.2*   < > 8.8* 8.7*  --   HCT 28.1*   < > 25.7*   < > 27.2*   < > 25.3*   < > 27.0*  --  26.0* 25.0*  --   PLT 191   < > 201   < > 242  --  236  --   --   --   --  255  --   LABPROT 16.4*  --  21.6*  --   --   --  18.1*  --   --   --   --   --   --   INR 1.3*  --  1.9*  --   --   --  1.5*  --   --   --   --   --   --   HEPARINUNFRC  --   --   --   --   --   --   --   --   --   --   --   --  <0.10*  CREATININE 0.76   < > 0.80   < > 0.88  --  0.93  --   --   --   --  1.07  --    < > = values in this interval not displayed.    Estimated Creatinine Clearance: 80.7 mL/min (by C-G formula based on SCr of 1.07 mg/dL).   Medical History: Past Medical History:  Diagnosis Date  . Dilated cardiomyopathy ([place])   . Dilated cardiomyopathy ([place]) 02/2019  . Polysubstance abuse ([facility])     Medications:  Scheduled:  . acetaminophen  1,000 mg Oral Q6H   Or  . acetaminophen (TYLENOL) oral liquid 160 mg/5 mL  1,000 mg Per Tube Q6H  . aspirin EC  325 mg Oral Daily   Or  . aspirin  324 mg Per Tube Daily   Or  . aspirin  300 mg Rectal Daily  . bisacodyl  10 mg Oral Daily   Or  . bisacodyl  10 mg Rectal Daily  . Chlorhexidine Gluconate Cloth  6 each Topical Daily  . digoxin  0.125 mg Oral Daily  . docusate sodium  200 mg Oral Daily  . hydrALAZINE  25 mg Oral Q6H  . insulin aspart  0-24 Units  Subcutaneous Q4H  . levalbuterol  1.25 mg Nebulization TID  . mouth rinse  15 mL Mouth Rinse BID  . pantoprazole  40 mg Oral Daily  . sodium chloride flush  10 mL Intravenous Q12H  . sodium chloride flush  10-40 mL Intracatheter Q12H  . sodium chloride flush  3 mL Intravenous Q12H  . spironolactone  12.5 mg Oral Daily  . warfarin  4 mg Oral q1800  . Warfarin - Physician Dosing Inpatient   Does not apply [phone]  Assessment: 33 yom now s/p HM3 LVAD implantation on 1/22. Started on warfarin 1/24. INR today is 1.5 - started on heparin infusion at 500 units/hr.  Hgb 8.7, plt 255. No infusion issues - did have bleeding at IJ site, stabilized once held pressure. Heparin level is undetectable.   Goal of Therapy:  Monitor platelets by anticoagulation protocol: Yes   Plan:  Continue heparin infusion at 500 units/hr per MD Monitor CBC, and for s/sx of bleeding  Sherron Monday, PharmD, BCCCP Clinical Pharmacist  Phone: [phone]  Please check AMION for all [facility] phone numbers After 10:00 PM, call Main Pharmacy [phone] 03/01/2019,7:57 PM  ADDENDUM Now significant bleeding at IV peripheral line site - nursing applying pressure. Discussed with Dr Gala Romney and okay to hold heparin infusion for 4 hours and will reduce rate to 400 units/hr. Will continue to monitor for s/sx of bleeding.  Sherron Monday, PharmD, BCCCP Clinical Pharmacist

## 2019-03-01 NOTE — Progress Notes (Signed)
Peripherally Inserted Central Catheter/Midline Placement  The IV Nurse has discussed with the patient and/or persons authorized to consent for the patient, the purpose of this procedure and the potential benefits and risks involved with this procedure.  The benefits include less needle sticks, lab draws from the catheter, and the patient may be discharged home with the catheter. Risks include, but not limited to, infection, bleeding, blood clot (thrombus formation), and puncture of an artery; nerve damage and irregular heartbeat and possibility to perform a PICC exchange if needed/ordered by physician.  Alternatives to this procedure were also discussed.  Bard Power PICC patient education guide, fact sheet on infection prevention and patient information card has been provided to patient /or left at bedside.    PICC/Midline Placement Documentation  PICC Double Lumen 03/01/19 PICC Right Brachial 35 cm 0 cm (Active)  Indication for Insertion or Continuance of Line Vasoactive infusions 03/01/19 1726  Exposed Catheter (cm) 0 cm 03/01/19 1726  Site Assessment Clean;Dry;Intact 03/01/19 1726  Lumen #1 Status Flushed;Saline locked;Blood return noted 03/01/19 1726  Lumen #2 Status Flushed;Saline locked;Blood return noted 03/01/19 1726  Dressing Type Transparent 03/01/19 1726  Dressing Status Clean;Dry;Intact;Antimicrobial disc in place 03/01/19 1726  Dressing Change Due 03/08/19 03/01/19 1726    Original consent obtained by patient, on 02/10/2019.    Jon Martin H 03/01/2019, 5:30 PM

## 2019-03-01 NOTE — Progress Notes (Signed)
CSW went by to visit patient at bedside although he was sound asleep. CSW did not disturb and received update on patient status from VAD Coordinator. CSW continues to follow for supportive intervention throughout implant hospitalization. Lasandra Beech, LCSW, CCSW-MCS [phone]

## 2019-03-01 NOTE — Progress Notes (Signed)
PT Cancellation Note  Patient Details Name: Jon Martin MRN: [id number] DOB: 1985/07/30   Cancelled Treatment:    Reason Eval/Treat Not Completed: Patient not medically ready Per RN, pt getting chest tube placed today. Holding PT. Will follow.   [name] [name] 03/01/2019, 9:57 AM [place], PT, DPT Acute Rehabilitation Services Pager [phone] Office [phone]

## 2019-03-01 NOTE — Progress Notes (Signed)
LVAD Coordinator Rounding Note:  Admitted 02/10/19 due to CHF from OSH .   HM3 LVAD implanted on 02/26/19 by PVT under DT criteria.  Pt lying in bed with 40% BiPap mask in place. Nurse reports pt became lethargic and difficult to arouse on nights, ABG obtained with I-Stat PO2 of 48.0 (arterial line draw). BiPap per Dr. Donata Clay.   Pt arouses to verbal stimuli, answers questions, MOE x 4 to command. He says he's tired because he hasn't been able to sleep over last few night. PRN phenergan given earlier for N&V.   Dr. Donata Clay in and assessed pt. CXR obtained and reviewed by Dr. Donata Clay - new right pneumothorax noted. Right pleural chest tube dc'd yesterday. Plan will be to remove left pleural tube today, but leave mediastinal and pocket drains in today. Repeat ABG this am.   Pt has been OOB to chair, goal today will be to walk as much as possible if patient's O2 improves. Pt aware of plan and agrees to same.   Vital signs: Temp: 97.8 HR: 104 Doppler Pressure: not done Art Line: 101/86 (93) O2 Sat: 100% on 40% BiPap  Wt: 106.4>116.4>128 lbs    LVAD interrogation reveals:  Speed: 5500 Flow: 3.6 Power: 4.0 PI: 5.2 Alarms: none Events:  4 PI events  Hematocrit: 26  Fixed speed: 5500 Low speed limit: 5200   Drive Line: CDI. Anchor secure. Existing VAD dressing removed and site care performed using sterile technique. Drive line exit site cleaned with Chlora prep applicators x 2, allowed to dry, and gauze dressing with silver strip re-applied. Exit site with 2 sutures intact, the velour is fully implanted at exit site. Slight redness around exit site with no tenderness, drainage, foul odor or rash noted. Drive line anchor re-applied.      Labs:  LDH trend: [phone]  INR trend: 1.3>1.9>1.5  Anticoagulation Plan: -INR Goal: 2-2.5 -ASA Dose: 325 mg until INR theapeutic  Blood Products:  -Intra op: 2 u PRBCS 6 FFP 1 PLT  Device: -N/A  Gtts: Milrinone 0.375  mcg/kg/min Amiodarone 30 mg/hr Lasix 8 mg/hr Nitro 50 mcg/min   Respiratory: Extubated 02/27/19 03/01/19>>40% BiPAP added   Arrhythmia:  - 02/28/19>>Amiodarone 30 mg/hr started for atrial tachycardia  Nitric Oxide: 4 ppm   VAD Education: 1. Pt very sleepy and on BiPap this am for low O2 level. He does communicate with VAD coordinator. I explained steps of DL dressing change.  2. Stressed importance of getting OOB and walking if O2 levels improve today, pt verbalized agreement to same.  3. No family at bedside.   Plan/Recommendations:   1. Page VAD coordinator w/any equipment issues or concerns or driveline management questions.  [name] RN, BSN VAD Coordinator 24/7 Pager [phone]

## 2019-03-01 NOTE — Progress Notes (Addendum)
Pt increasingly drowsy/ lethargic this AM, difficult to awake.  Issues with iSTAT translating to Epic, ABG results this AM from iSTAT:  *Ph- 7.56 CO2- 26.5 PO2-48 HCO3-23.7 BE,B- 2 s02- 89%  Na- 128 K-3.8   Dr. Zenaida Niece Tright notified- verbal orders given to place Pt on Bipap STAT, redraw ABG in 1 hour. RT notified.

## 2019-03-01 NOTE — Progress Notes (Addendum)
Patient ID: Jon Martin, male   DOB: 1985/08/09, 34 y.o.   MRN: [id number]     Advanced Heart Failure Rounding Note  PCP-Cardiologist: No primary care provider on file.   Subjective:    34 y.o. 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  Underwent HM-3 placement 02/26/2019.  Extubated 1/23. Placed on  BiPAP this am for increased lethargy.    iSTAT ABG *Ph- 7.56 CO2- 26.5 PO2-48 HCO3-23.7 BE,B- 2 s02- 89%  STAT CXR shows new moderate size right pneumothorax  On milrinone 0.375 + IV nitro. Co-ox 61 %.  Good UOP on lasix gtt, -3.7L out yesterday but CVP higher at 16 and wt up 12 lb from yesterday.   CXR with RUL infiltrate, started on  Ceftaz 1/24. Afebrile. WBC ct trending down.   LVAD Interrogation HM 3: Speed: 5500 Flow: 3.6 PI: 5.1 Power: 4.1 VAD interrogated personally. Parameters stable.  Objective:   Weight Range: 58.1 kg Body mass index is 19.48 kg/m.   Vital Signs:   Temp:  [97.8 F (36.6 C)-99.9 F (37.7 C)] 98.4 F (36.9 C) (01/25 0400) Pulse Rate:  [48-130] 105 (01/25 0715) Resp:  [15-32] 20 (01/25 0715) BP: (92-108)/(56-96) 99/56 (01/25 0710) SpO2:  [88 %-100 %] 97 % (01/25 0715) Arterial Line BP: (87-115)/(74-88) 98/85 (01/25 0715) FiO2 (%):  [40 %] 40 % (01/25 0757) Last BM Date: 02/25/19  Weight change: Filed Weights   02/26/19 0500 02/27/19 0500 02/28/19 0429  Weight: 42 kg 52.8 kg 58.1 kg    Intake/Output:   Intake/Output Summary (Last 24 hours) at 03/01/2019 0800 Last data filed at 03/01/2019 0700 Gross per 24 hour  Intake 2472.92 ml  Output 3470 ml  Net -997.08 ml      Physical Exam   CVP 16 General:  Thin, Young AAM on BiPAP  HEENT: normal  Neck: supple. JVP to jaw  Carotids 2+ bilat; no bruits. No lymphadenopathy or thryomegaly appreciated. Cor: Sternal wound healing well. No drainage.  + CTs. + LVAD hum.  Lungs: decreased BS at the bases  Abdomen:  soft, nontender, nondistended. No  hepatosplenomegaly. No bruits or masses. Hypoactive bowel sounds. Driveline site clean. Anchor in place.  Extremities: no cyanosis, clubbing, rash. No edema  Neuro: weak nonfocal   Telemetry  Sinus tachycardia, low 100s-110s   Labs    CBC Recent Labs    02/28/19 0209 02/28/19 0426 02/28/19 1609 02/28/19 1609 03/01/19 0332 03/01/19 0644  WBC 17.8*   < > 20.5*  --  18.8*  --   NEUTROABS 14.1*  --   --   --  14.7*  --   HGB 8.8*   < > 9.4*   < > 8.9* 9.2*  HCT 25.7*   < > 27.2*   < > 25.3* 27.0*  MCV 92.8   < > 90.4  --  90.0  --   PLT 201   < > 242  --  236  --    < > = values in this interval not displayed.   Basic Metabolic Panel Recent Labs    77/11/65 0209 02/28/19 0426 02/28/19 1609 02/28/19 1609 03/01/19 0332 03/01/19 0644  NA 131*   < > 129*   < > 129* 128*  K 4.1   < > 3.5   < > 3.4* 3.8  CL 100   < > 97*  --  92*  --   CO2 18*   < > 21*  --  22  --  GLUCOSE 151*   < > 133*  --  114*  --   BUN 11   < > 11  --  13  --   CREATININE 0.80   < > 0.88  --  0.93  --   CALCIUM 8.6*   < > 8.8*  --  8.8*  --   MG 1.9  --   --   --  1.8  --   PHOS 3.7  --   --   --  4.7*  --    < > = values in this interval not displayed.   Liver Function Tests Recent Labs    02/28/19 0209 03/01/19 0332  AST 86* 54*  ALT 27 28  ALKPHOS 54 91  BILITOT 5.5* 5.0*  PROT 5.7* 6.1*  ALBUMIN 2.8* 2.7*   No results for input(s): LIPASE, AMYLASE in the last 72 hours. Cardiac Enzymes No results for input(s): CKTOTAL, CKMB, CKMBINDEX, TROPONINI in the last 72 hours.  BNP: BNP (last 3 results) Recent Labs    02/10/19 0219 02/27/19 0433  BNP 2,048.6* 447.0*    ProBNP (last 3 results) No results for input(s): PROBNP in the last 8760 hours.   D-Dimer Recent Labs    02/26/19 1411  DDIMER 1.74*   Hemoglobin A1C No results for input(s): HGBA1C in the last 72 hours. Fasting Lipid Panel No results for input(s): CHOL, HDL, LDLCALC, TRIG, CHOLHDL, LDLDIRECT in the last 72  hours. Thyroid Function Tests No results for input(s): TSH, T4TOTAL, T3FREE, THYROIDAB in the last 72 hours.  Invalid input(s): FREET3  Other results:   Imaging    [place] EKG SITE RITE  Result Date: 03/01/2019 If Site Rite image not attached, placement could not be confirmed due to current cardiac rhythm.    Medications:     Scheduled Medications: . acetaminophen  1,000 mg Oral Q6H   Or  . acetaminophen (TYLENOL) oral liquid 160 mg/5 mL  1,000 mg Per Tube Q6H  . aspirin EC  325 mg Oral Daily   Or  . aspirin  324 mg Per Tube Daily   Or  . aspirin  300 mg Rectal Daily  . bisacodyl  10 mg Oral Daily   Or  . bisacodyl  10 mg Rectal Daily  . Chlorhexidine Gluconate Cloth  6 each Topical Daily  . digoxin  0.125 mg Oral Daily  . docusate sodium  200 mg Oral Daily  . hydrALAZINE  25 mg Oral Q6H  . insulin aspart  0-24 Units Subcutaneous Q4H  . levalbuterol  1.25 mg Nebulization TID  . mouth rinse  15 mL Mouth Rinse BID  . pantoprazole  40 mg Oral Daily  . sodium chloride flush  10 mL Intravenous Q12H  . sodium chloride flush  3 mL Intravenous Q12H  . Warfarin - Physician Dosing Inpatient   Does not apply q1800    Infusions: . sodium chloride Stopped (02/28/19 0856)  . sodium chloride    . sodium chloride Stopped (03/01/19 0631)  . amiodarone 30 mg/hr (03/01/19 0700)  . cefTAZidime (FORTAZ)  IV 200 mL/hr at 03/01/19 0700  . famotidine (PEPCID) IV Stopped (03/01/19 0047)  . furosemide (LASIX) infusion 8 mg/hr (03/01/19 0700)  . lactated ringers    . lactated ringers 20 mL/hr at 03/01/19 0700  . milrinone 0.375 mcg/kg/min (03/01/19 0700)  . nitroGLYCERIN 50 mcg/min (03/01/19 0700)  . potassium chloride 10 mEq (03/01/19 0749)    PRN Medications: sodium chloride, hydrALAZINE, midazolam, ondansetron (ZOFRAN) IV, oxyCODONE,  promethazine, sodium chloride flush, sodium chloride flush, traMADol     Assessment/Plan   1. Acute/Chronic Systolic HF -> cardiogenic shock -  ECHO at [facility] EF, 20%.  - ECHO repeated and showed severely reduced EF <20%. RV only mildly down - R/L cath 1/7 normal cors. Output ok   - s/p HM-3 on 02/26/2019 - On milrinone 0.375. Early AM draw Co-ox 61%. Repeat pending  - VAD interrogated personally. Parameters stable. No PI events today - CVP higher today at 16. Weight up 12 lb overnight. Continue lasix gtt. Increase rate to 10 mg/hr. Keep CVP ~10-12.    2. VAD -  s/p HM-3 on 02/26/2019 -  RV down some. Continue IV nitro.  -  VAD interrogated personally. Parameters stable. -  Add sildenafil as more stable - LDH 418 -> 431->412 - Warfarin started - INR 1.5   3. Acute post-op respiratory failure with RUL PNA - Extubated 1/23. Now on BiPAP. CXR today w/ new rt sided pneumothorax (see below) - Now with RUL PNA. Ceftaz started 1/24.  - sputum cx pending - Afebrile, WBC downtrending - Add vanc as needed - Encourage IS  4. Acute blood loss anemia - keep HGB > 8.0  5. Elevated bilirubin - 0.7 -> 4.7 -> 5.5 -> 5.0 - likely combination of RV failure and underlying liver disease - hopefully will improved with diuresis and liver decongestion.   6. Moderate size right pneumothorax: new on CXR 1/25.  - Rt pleural CT removed 1/24. Per CT surgery, plan to remove left pleural tube but leave mediastinal and pocket drains in today.    Length of Stay: [address] PA-C  03/01/2019, 8:00 AM  Advanced Heart Failure Team Pager [phone] (M-F; 7a - 4p)  Please contact [facility] for night-coverage after hours (4p -7a ) and weekends on [email or web address]  Agree with above.   Now s/p R chest tube for acute PTX. Breathing better. Remains on milrinone. Epi off. Co-ox ok at 61%. Volume status still elevated but diuresing well on lasix. Continue lasix gtt. Supp K. Start spiro. Bilirubin improving with diuresis. VAD interrogated personally. Parameters stable.  General:  Weak appearing. No resp distress.  HEENT: normal  Neck: supple.  JVP to jaw.  Carotids 2+ bilat; no bruits. No lymphadenopathy or thryomegaly appreciated. Cor: LVAD hum. Sternal dressing ok. + right CT Lungs: Clear. Abdomen: obese soft, nontender, non-distended. No hepatosplenomegaly. No bruits or masses. Good bowel sounds. Driveline site clean. Anchor in place.  Extremities: no cyanosis, clubbing, rash. Warm  1+ edema  Neuro: alert & oriented x 3. No focal deficits. Moves all 4 without problem   Remains somewhat tenuous. Improved with chest tube placement. Continue milrinone for RV support. Continue lasix gtt. VAD interrogated personally. Parameters stable. Warfarin being loaded. Watch bili and renal function. Supp K. Mobilize.   CRITICAL CARE Performed by: Arvilla Meres  Total critical care time: 35 minutes  Critical care time was exclusive of separately billable procedures and treating other patients.  Critical care was necessary to treat or prevent imminent or life-threatening deterioration.  Critical care was time spent personally by me (independent of midlevel providers or residents) on the following activities: development of treatment plan with patient and/or surrogate as well as nursing, discussions with consultants, evaluation of patient's response to treatment, examination of patient, obtaining history from patient or surrogate, ordering and performing treatments and interventions, ordering and review of laboratory studies, ordering and review of radiographic studies, pulse oximetry and re-evaluation of patient's  condition.  Glori Bickers, MD  3:46 PM

## 2019-03-01 NOTE — Progress Notes (Signed)
HeartMate 3 Rounding Note Heartmate 3 Implanted 02-26-2019 Subjective:    34 yo with nonischemic CM, mod RV dysfunction\and inotrope dependent Intermacs 2 cardiogenic shock    Hx ETOH, drugs, cirrhosis HM3 implant with inotrope supported RV dysfunction Operative bronchoscopy needed for secretion induced RUL collapse Coagulopathy related to liver disease, HF  Patient developed nausea vomiting last p.m. unresponsive to Zofran and was given 1 dose of IV Phenergan with subsequent sedation and poor tolerance for mobilization.  Chest x-ray this a.m. showed a new right moderate pneumothorax for which a 20 French chest tube was placed at the bedside with reexpansion of the right lung.  He has been diuresing well and has maintained sinus rhythm. Epinephrine has been weaned off, inhaled nitric oxide is being weaned off today  VATDparameters are satisfactory with very few PI events  LVAD INTERROGATION:  HeartMate III LVAD:  Flow 3.8 liters/min, speed 5500, power 4.8, PI 3.2.     Objective:    Vital Signs:   Temp:  [97.8 F (36.6 C)-98.4 F (36.9 C)] 97.8 F (36.6 C) (01/25 0803) Pulse Rate:  [48-130] 55 (01/25 1400) Resp:  [15-26] 22 (01/25 1200) BP: (92-115)/(56-102) 107/92 (01/25 1400) SpO2:  [88 %-100 %] 95 % (01/25 1400) Arterial Line BP: (87-112)/(74-91) 93/79 (01/25 1400) FiO2 (%):  [40 %] 40 % (01/25 1141) Last BM Date: 02/25/19 Mean arterial Pressure 85-95 mm Hg  Intake/Output:   Intake/Output Summary (Last 24 hours) at 03/01/2019 1416 Last data filed at 03/01/2019 1400 Gross per 24 hour  Intake 2389.29 ml  Output 3310 ml  Net -920.71 ml     Physical Exam: HEENT: normal  General:  Extubated, neuro intact, c/o surgical pain  ENT : JVP normal Carotids; no bruits. No lymphadenopathy or thryomegaly appreciated. Cor: Mechanical heart sounds with LVAD hum present. Lungs: clear Abdomen: soft, nontender, nondistended. No hepatosplenomegaly. No bruits or masses. Good bowel  sounds. Extremities: no cyanosis, clubbing, rash, edema Neuro: alert & orientedx3, cranial nerves grossly intact. moves all 4 extremities w/o difficulty. Affect pleasant  Telemetry: nsr rate 100  Labs: Basic Metabolic Panel: Recent Labs  Lab 02/26/19 2016 02/26/19 2024 02/27/19 0430 02/27/19 0441 02/27/19 1422 02/27/19 1422 02/28/19 0209 02/28/19 0209 02/28/19 0426 02/28/19 1609 03/01/19 0332 03/01/19 0644 03/01/19 1300  NA 133*   < > 135   < > 136   < > 131*   < > 132* 129* 129* 128* 128*  K 3.0*   < > 4.4   < > 3.4*   < > 4.1   < > 4.2 3.5 3.4* 3.8 3.3*  CL 101   < > 103   < > 104  --  100  --   --  97* 92*  --  91*  CO2 23   < > 23   < > 21*  --  18*  --   --  21* 22  --  21*  GLUCOSE 135*   < > 134*   < > 134*  --  151*  --   --  133* 114*  --  118*  BUN 15   < > 13   < > 10  --  11  --   --  11 13  --  16  CREATININE 0.72   < > 0.76   < > 0.70  --  0.80  --   --  0.88 0.93  --  1.07  CALCIUM 7.9*   < > 8.2*   < >  8.4*   < > 8.6*   < >  --  8.8* 8.8*  --  8.5*  MG 2.6*  --  2.3  --  2.0  --  1.9  --   --   --  1.8  --   --   PHOS  --   --  4.0  --   --   --  3.7  --   --   --  4.7*  --   --    < > = values in this interval not displayed.    Liver Function Tests: Recent Labs  Lab 02/24/19 0445 02/27/19 0430 02/28/19 0209 03/01/19 0332  AST 41 132* 86* 54*  ALT 33 30 27 28   ALKPHOS 63 47 54 91  BILITOT 0.7 4.7* 5.5* 5.0*  PROT 7.1 5.6* 5.7* 6.1*  ALBUMIN 3.0* 3.2* 2.8* 2.7*   No results for input(s): LIPASE, AMYLASE in the last 168 hours. No results for input(s): AMMONIA in the last 168 hours.  CBC: Recent Labs  Lab 02/27/19 0430 02/27/19 0441 02/27/19 1422 02/27/19 1422 02/28/19 0209 02/28/19 0209 02/28/19 0426 02/28/19 1609 03/01/19 0332 03/01/19 0644 03/01/19 1300  WBC 11.6*   < > 13.6*  --  17.8*  --   --  20.5* 18.8*  --  18.5*  NEUTROABS 8.7*  --   --   --  14.1*  --   --   --  14.7*  --   --   HGB 9.7*   < > 9.0*   < > 8.8*   < > 8.8* 9.4*  8.9* 9.2* 8.7*  HCT 28.1*   < > 25.3*   < > 25.7*   < > 26.0* 27.2* 25.3* 27.0* 25.0*  MCV 92.7   < > 92.0  --  92.8  --   --  90.4 90.0  --  89.9  PLT 191   < > 183  --  201  --   --  242 236  --  255   < > = values in this interval not displayed.    INR: Recent Labs  Lab 02/24/19 0445 02/26/19 1411 02/27/19 0430 02/28/19 0209 03/01/19 0332  INR 1.1 1.3* 1.3* 1.9* 1.5*    Other results:  EKG:   Imaging: DG Chest Port 1 View  Result Date: 03/01/2019 CLINICAL DATA:  Chest tube insertion EXAM: PORTABLE CHEST 1 VIEW COMPARISON:  03/01/2019, 7:44 a.m. FINDINGS: Interval placement of a right-sided chest tube, tip projecting about the peripheral right upper lobe. There has been complete resolution of previously seen small right apical pneumothorax. Bandlike scarring or atelectasis of the right midlung. Previously seen left-sided chest tube has been removed. Cardiomegaly status post median sternotomy and LVAD placement. IMPRESSION: 1. Interval placement of a right-sided chest tube with complete resolution of previously seen small right apical pneumothorax. 2. Interval removal of left-sided chest tube. 3. Cardiomegaly status post median sternotomy and LVAD placement. Electronically Signed   By: 03/03/2019 M.D.   On: 03/01/2019 11:48   DG CHEST PORT 1 VIEW  Addendum Date: 03/01/2019   ADDENDUM REPORT: 03/01/2019 08:25 ADDENDUM: These results were called by telephone at the time of interpretation on 03/01/2019 at 8:22 a.m. to provider Dr. 03/03/2019 TRIGT's nurse in the OR, Chloe, who verbally acknowledged these results and will communicate to Dr. Kathlee Nations. Electronically Signed   By: Donata Clay M.D.   On: 03/01/2019 08:25   Result Date: 03/01/2019 CLINICAL DATA:  Right-sided chest  tube.  BiPAP dependent. EXAM: PORTABLE CHEST 1 VIEW COMPARISON:  02/28/2019 FINDINGS: Right IJ central venous sheath is in place. Medial right apical chest tube in place versus mediastinal drain. Left-sided chest  tube unchanged. LVAD unchanged. Interval development of moderate size right pneumothorax. Stable opacification over the right mid to upper lung. 1.5 cm nodular opacity along the right heart border. Stable left retrocardiac opacification. Stable cardiomegaly. Remainder of the exam is unchanged. IMPRESSION: 1. Interval development of moderate size right pneumothorax. Medial right upper chest tube versus mediastinal drain unchanged. 2. Stable opacification over the right midlung with nodular opacification in the right infrahilar region along the right heart border. Stable left base opacification. 3.  Stable cardiomegaly. 4.  Tubes and lines as described. Electronically Signed: By: Elberta Fortis M.D. On: 03/01/2019 08:12   DG Chest Port 1 View  Result Date: 02/28/2019 CLINICAL DATA:  LVAD EXAM: PORTABLE CHEST 1 VIEW COMPARISON:  02/27/2019 FINDINGS: Interval extubation and removal of enteric tube. Stable right IJ Swan-Ganz catheter in the right main pulmonary artery. Stable bilateral chest tubes and mediastinal drain. No pneumothorax is seen. Mild patchy opacity in the right upper lung, likely atelectasis/contusion. Left lung is essentially clear. Cardiomegaly.  LVAD. Median sternotomy. IMPRESSION: Interval extubation and removal of enteric tube. Bilateral chest tubes and mediastinal drain. No pneumothorax is seen. Stable LVAD. Electronically Signed   By: Charline Bills M.D.   On: 02/28/2019 07:21   [place] EKG SITE RITE  Result Date: 03/01/2019 If Site Rite image not attached, placement could not be confirmed due to current cardiac rhythm.    Medications:     Scheduled Medications: . acetaminophen  1,000 mg Oral Q6H   Or  . acetaminophen (TYLENOL) oral liquid 160 mg/5 mL  1,000 mg Per Tube Q6H  . aspirin EC  325 mg Oral Daily   Or  . aspirin  324 mg Per Tube Daily   Or  . aspirin  300 mg Rectal Daily  . bisacodyl  10 mg Oral Daily   Or  . bisacodyl  10 mg Rectal Daily  . Chlorhexidine Gluconate  Cloth  6 each Topical Daily  . digoxin  0.125 mg Oral Daily  . docusate sodium  200 mg Oral Daily  . hydrALAZINE  25 mg Oral Q6H  . insulin aspart  0-24 Units Subcutaneous Q4H  . levalbuterol  1.25 mg Nebulization TID  . mouth rinse  15 mL Mouth Rinse BID  . pantoprazole  40 mg Oral Daily  . sodium chloride flush  10 mL Intravenous Q12H  . sodium chloride flush  3 mL Intravenous Q12H  . Warfarin - Physician Dosing Inpatient   Does not apply q1800    Infusions: . sodium chloride Stopped (02/28/19 0856)  . sodium chloride    . sodium chloride Stopped (03/01/19 1246)  . cefTAZidime (FORTAZ)  IV 1 g (03/01/19 1406)  . famotidine (PEPCID) IV Stopped (03/01/19 1226)  . furosemide (LASIX) infusion 10 mg/hr (03/01/19 1400)  . heparin 500 Units/hr (03/01/19 1400)  . lactated ringers    . lactated ringers 10 mL/hr at 03/01/19 1403  . milrinone 0.375 mcg/kg/min (03/01/19 1400)  . nitroGLYCERIN 50 mcg/min (03/01/19 1401)  . potassium chloride      PRN Medications: sodium chloride, hydrALAZINE, midazolam, ondansetron (ZOFRAN) IV, oxyCODONE, sodium chloride flush, sodium chloride flush, traMADol   Assessment:  Postop coagulopathy- improved\ Only 1 PI events for 24 hrs VAD parameters satisfactorywith flow 3.7, coox 67% Airway secretions/ RUL atelectasis- improved but  not cleared Postop bilrubin 4.2 >> 5.4>> 5.0 follow Postop elevated MAP- on iv NTG Patient extubated with infiltrate RUL and rising WBC- cont IV Fortaz Plan/Discussion:   Lasix drip for increased wt Wean NO today DC mediastinal tube and left pleural tube today Continue milrinone for RV support Start po apresoline when patient more awake and can swallow safely Cont dig,, transition to oral amiodarone INR down to 1.3-we will start low-dose heparin bridge and continue oral Coumadin dosing.  I reviewed the LVAD parameters from today, and compared the results to the patient's prior recorded data.  No programming changes were  made.  The LVAD is functioning within specified parameters.  The patient performs LVAD self-test daily.  LVAD interrogation was negative for any significant power changes, alarms or PI events/speed drops.  LVAD equipment check completed and is in good working order.  Back-up equipment present.   LVAD education done on emergency procedures and precautions and reviewed exit site care.  Length of Stay: 20  Kathlee Nations Trigt III 03/01/2019, 2:16 PM

## 2019-03-01 NOTE — Progress Notes (Signed)
Patient placed off bipap and on to 5L [place] per MD. Nitric is being weaned on the nasal cannula. Nitric is now at 4ppm and will be weaned down by 1 every hour till off. RN made aware. Vitals are stable. RT will continue to monitor.

## 2019-03-02 ENCOUNTER — Inpatient Hospital Stay (HOSPITAL_COMMUNITY): Payer: Medicaid - Out of State

## 2019-03-02 LAB — COMPREHENSIVE METABOLIC PANEL
ALT: 23 U/L (ref 0–44)
AST: 37 U/L (ref 15–41)
Albumin: 2.4 g/dL — ABNORMAL LOW (ref 3.5–5.0)
Alkaline Phosphatase: 66 U/L (ref 38–126)
Anion gap: 13 (ref 5–15)
BUN: 16 mg/dL (ref 6–20)
CO2: 24 mmol/L (ref 22–32)
Calcium: 8 mg/dL — ABNORMAL LOW (ref 8.9–10.3)
Chloride: 97 mmol/L — ABNORMAL LOW (ref 98–111)
Creatinine, Ser: 0.95 mg/dL (ref 0.61–1.24)
GFR calc Af Amer: >60 mL/min (ref >60)
GFR calc non Af Amer: >60 mL/min (ref >60)
Glucose, Bld: 92 mg/dL (ref 70–99)
Potassium: 2.6 mmol/L — CL (ref 3.5–5.1)
Sodium: 134 mmol/L — ABNORMAL LOW (ref 135–145)
Total Bilirubin: 2.4 mg/dL — ABNORMAL HIGH (ref 0.3–1.2)
Total Protein: 5.7 g/dL — ABNORMAL LOW (ref 6.5–8.1)

## 2019-03-02 LAB — CBC
HCT: 25.1 % — ABNORMAL LOW (ref 39.0–52.0)
Hemoglobin: 8.6 g/dL — ABNORMAL LOW (ref 13.0–17.0)
MCH: 31.2 pg (ref 26.0–34.0)
MCHC: 34.3 g/dL (ref 30.0–36.0)
MCV: 90.9 fL (ref 80.0–100.0)
Platelets: 260 10*3/uL (ref 150–400)
RBC: 2.76 MIL/uL — ABNORMAL LOW (ref 4.22–5.81)
RDW: 14.7 % (ref 11.5–15.5)
WBC: 16 10*3/uL — ABNORMAL HIGH (ref 4.0–10.5)
nRBC: 0 % (ref 0.0–0.2)

## 2019-03-02 LAB — CBC WITH DIFFERENTIAL/PLATELET
Abs Immature Granulocytes: 0.08 10*3/uL — ABNORMAL HIGH (ref 0.00–0.07)
Basophils Absolute: 0 10*3/uL (ref 0.0–0.1)
Basophils Relative: 0 %
Eosinophils Absolute: 0 10*3/uL (ref 0.0–0.5)
Eosinophils Relative: 1 %
HCT: 14.3 % — ABNORMAL LOW (ref 39.0–52.0)
Hemoglobin: 4.8 g/dL — CL (ref 13.0–17.0)
Immature Granulocytes: 1 %
Lymphocytes Relative: 13 %
Lymphs Abs: 1.1 10*3/uL (ref 0.7–4.0)
MCH: 30.8 pg (ref 26.0–34.0)
MCHC: 33.6 g/dL (ref 30.0–36.0)
MCV: 91.7 fL (ref 80.0–100.0)
Monocytes Absolute: 1.2 10*3/uL — ABNORMAL HIGH (ref 0.1–1.0)
Monocytes Relative: 15 %
Neutro Abs: 5.9 10*3/uL (ref 1.7–7.7)
Neutrophils Relative %: 70 %
Platelets: 137 10*3/uL — ABNORMAL LOW (ref 150–400)
RBC: 1.56 MIL/uL — ABNORMAL LOW (ref 4.22–5.81)
RDW: 14.8 % (ref 11.5–15.5)
WBC: 8.4 10*3/uL (ref 4.0–10.5)
nRBC: 0 % (ref 0.0–0.2)

## 2019-03-02 LAB — BPAM RBC
Blood Product Expiration Date: 202102122359
Blood Product Expiration Date: 202102122359
Blood Product Expiration Date: 202102152359
Blood Product Expiration Date: 202102152359
ISSUE DATE / TIME: 202101220829
ISSUE DATE / TIME: 202101220829
ISSUE DATE / TIME: 202101220829
ISSUE DATE / TIME: 202101221629
Unit Type and Rh: 5100
Unit Type and Rh: 5100
Unit Type and Rh: 5100
Unit Type and Rh: 5100

## 2019-03-02 LAB — POCT I-STAT 7, (LYTES, BLD GAS, ICA,H+H)
Acid-Base Excess: 4 mmol/L — ABNORMAL HIGH (ref 0.0–2.0)
Bicarbonate: 26.5 mmol/L (ref 20.0–28.0)
Calcium, Ion: 1.08 mmol/L — ABNORMAL LOW (ref 1.15–1.40)
HCT: 27 % — ABNORMAL LOW (ref 39.0–52.0)
Hemoglobin: 9.2 g/dL — ABNORMAL LOW (ref 13.0–17.0)
O2 Saturation: 99 %
Patient temperature: 98.7
Potassium: 2.6 mmol/L — CL (ref 3.5–5.1)
Sodium: 132 mmol/L — ABNORMAL LOW (ref 135–145)
TCO2: 27 mmol/L (ref 22–32)
pCO2 arterial: 30.4 mmHg — ABNORMAL LOW (ref 32.0–48.0)
pH, Arterial: 7.549 — ABNORMAL HIGH (ref 7.350–7.450)
pO2, Arterial: 99 mmHg (ref 83.0–108.0)

## 2019-03-02 LAB — HEPARIN LEVEL (UNFRACTIONATED)
Heparin Unfractionated: <0.1 IU/mL — ABNORMAL LOW (ref 0.30–0.70)
Heparin Unfractionated: <0.1 IU/mL — ABNORMAL LOW (ref 0.30–0.70)
Heparin Unfractionated: <0.1 IU/mL — ABNORMAL LOW (ref 0.30–0.70)

## 2019-03-02 LAB — TYPE AND SCREEN
ABO/RH(D): O POS
Antibody Screen: NEGATIVE
Unit division: 0
Unit division: 0
Unit division: 0
Unit division: 0

## 2019-03-02 LAB — LACTATE DEHYDROGENASE: LDH: 373 U/L — ABNORMAL HIGH (ref 98–192)

## 2019-03-02 LAB — BASIC METABOLIC PANEL
Anion gap: 12 (ref 5–15)
BUN: 18 mg/dL (ref 6–20)
CO2: 25 mmol/L (ref 22–32)
Calcium: 8 mg/dL — ABNORMAL LOW (ref 8.9–10.3)
Chloride: 93 mmol/L — ABNORMAL LOW (ref 98–111)
Creatinine, Ser: 0.85 mg/dL (ref 0.61–1.24)
GFR calc Af Amer: >60 mL/min (ref >60)
GFR calc non Af Amer: >60 mL/min (ref >60)
Glucose, Bld: 124 mg/dL — ABNORMAL HIGH (ref 70–99)
Potassium: 3.5 mmol/L (ref 3.5–5.1)
Sodium: 130 mmol/L — ABNORMAL LOW (ref 135–145)

## 2019-03-02 LAB — COOXEMETRY PANEL
Carboxyhemoglobin: 1 % (ref 0.5–1.5)
Carboxyhemoglobin: 1.8 % — ABNORMAL HIGH (ref 0.5–1.5)
Methemoglobin: 0.6 % (ref 0.0–1.5)
Methemoglobin: 0.9 % (ref 0.0–1.5)
O2 Saturation: 68.6 %
O2 Saturation: 65.8 %
Total hemoglobin: 12.6 g/dL (ref 12.0–16.0)
Total hemoglobin: 8.2 g/dL — ABNORMAL LOW (ref 12.0–16.0)

## 2019-03-02 LAB — MAGNESIUM: Magnesium: 1.7 mg/dL (ref 1.7–2.4)

## 2019-03-02 LAB — HEMOGLOBIN AND HEMATOCRIT, BLOOD
HCT: 25.4 % — ABNORMAL LOW (ref 39.0–52.0)
Hemoglobin: 8.8 g/dL — ABNORMAL LOW (ref 13.0–17.0)

## 2019-03-02 LAB — GLUCOSE, CAPILLARY
Glucose-Capillary: 124 mg/dL — ABNORMAL HIGH (ref 70–99)
Glucose-Capillary: 124 mg/dL — ABNORMAL HIGH (ref 70–99)
Glucose-Capillary: 125 mg/dL — ABNORMAL HIGH (ref 70–99)
Glucose-Capillary: 127 mg/dL — ABNORMAL HIGH (ref 70–99)
Glucose-Capillary: 139 mg/dL — ABNORMAL HIGH (ref 70–99)
Glucose-Capillary: 144 mg/dL — ABNORMAL HIGH (ref 70–99)
Glucose-Capillary: 38 mg/dL — CL (ref 70–99)
Glucose-Capillary: 76 mg/dL (ref 70–99)

## 2019-03-02 LAB — PROTIME-INR
INR: 1.2 (ref 0.8–1.2)
Prothrombin Time: 15.3 seconds — ABNORMAL HIGH (ref 11.4–15.2)

## 2019-03-02 LAB — PHOSPHORUS: Phosphorus: 2.9 mg/dL (ref 2.5–4.6)

## 2019-03-02 MED ORDER — POTASSIUM CHLORIDE CRYS ER 20 MEQ PO TBCR
40.0000 meq | EXTENDED_RELEASE_TABLET | Freq: Once | ORAL | Status: AC
  Administered 2019-03-02: 40 meq via ORAL
  Filled 2019-03-02: qty 2

## 2019-03-02 MED ORDER — POTASSIUM CHLORIDE 10 MEQ/50ML IV SOLN
10.0000 meq | INTRAVENOUS | Status: DC
  Administered 2019-03-02: 10 meq via INTRAVENOUS

## 2019-03-02 MED ORDER — BOOST PO LIQD
237.0000 mL | Freq: Three times a day (TID) | ORAL | Status: DC
  Filled 2019-03-02: qty 237

## 2019-03-02 MED ORDER — POTASSIUM CHLORIDE 10 MEQ/50ML IV SOLN
10.0000 meq | INTRAVENOUS | Status: AC
  Administered 2019-03-02: 10 meq via INTRAVENOUS
  Filled 2019-03-02 (×2): qty 50

## 2019-03-02 MED ORDER — FUROSEMIDE 10 MG/ML IJ SOLN
10.0000 mg/h | INTRAVENOUS | Status: DC
  Filled 2019-03-02 (×2): qty 25

## 2019-03-02 MED ORDER — ENSURE ENLIVE PO LIQD: 237.0000 mL | Freq: Three times a day (TID) | ORAL | Status: DC

## 2019-03-02 MED ORDER — INSULIN ASPART 100 UNIT/ML ~~LOC~~ SOLN
0.0000 [IU] | Freq: Three times a day (TID) | SUBCUTANEOUS | Status: DC
  Administered 2019-03-02 (×3): 1 [IU] via SUBCUTANEOUS

## 2019-03-02 MED ORDER — POTASSIUM CHLORIDE CRYS ER 20 MEQ PO TBCR
40.0000 meq | EXTENDED_RELEASE_TABLET | Freq: Two times a day (BID) | ORAL | Status: DC
  Administered 2019-03-02 – 2019-03-05 (×7): 40 meq via ORAL
  Filled 2019-03-02 (×7): qty 2

## 2019-03-02 MED ORDER — POTASSIUM CHLORIDE 10 MEQ/50ML IV SOLN
10.0000 meq | INTRAVENOUS | Status: AC
  Administered 2019-03-02 (×3): 10 meq via INTRAVENOUS
  Filled 2019-03-02 (×3): qty 50

## 2019-03-02 MED ORDER — FUROSEMIDE 40 MG PO TABS: 40.0000 mg | ORAL_TABLET | Freq: Every day | ORAL | Status: DC

## 2019-03-02 MED ORDER — MILRINONE LACTATE IN DEXTROSE 20-5 MG/100ML-% IV SOLN
0.1250 ug/kg/min | INTRAVENOUS | Status: DC
  Administered 2019-03-02: 0.25 ug/kg/min via INTRAVENOUS
  Administered 2019-03-04 – 2019-03-06 (×2): 0.125 ug/kg/min via INTRAVENOUS
  Filled 2019-03-02 (×3): qty 100

## 2019-03-02 MED ORDER — WARFARIN SODIUM 5 MG PO TABS
5.0000 mg | ORAL_TABLET | Freq: Every day | ORAL | Status: DC
  Administered 2019-03-02 – 2019-03-03 (×2): 5 mg via ORAL
  Filled 2019-03-02 (×2): qty 1

## 2019-03-02 MED ORDER — FUROSEMIDE 10 MG/ML IJ SOLN: 20.0000 mg | Freq: Four times a day (QID) | INTRAMUSCULAR | Status: DC

## 2019-03-02 MED ORDER — FUROSEMIDE 10 MG/ML IJ SOLN
20.0000 mg | Freq: Two times a day (BID) | INTRAMUSCULAR | Status: DC
  Administered 2019-03-02 – 2019-03-03 (×3): 20 mg via INTRAVENOUS
  Filled 2019-03-02 (×4): qty 2

## 2019-03-02 MED ORDER — INSULIN ASPART 100 UNIT/ML ~~LOC~~ SOLN: 0.0000 [IU] | Freq: Every day | SUBCUTANEOUS | Status: DC

## 2019-03-02 MED FILL — Potassium Chloride Inj 2 mEq/ML: INTRAVENOUS | Qty: 40 | Status: AC

## 2019-03-02 MED FILL — Electrolyte-R (PH 7.4) Solution: INTRAVENOUS | Qty: 3000 | Status: AC

## 2019-03-02 MED FILL — Sodium Chloride IV Soln 0.9%: INTRAVENOUS | Qty: 2000 | Status: AC

## 2019-03-02 MED FILL — Heparin Sodium (Porcine) Inj 1000 Unit/ML: INTRAMUSCULAR | Qty: 10 | Status: AC

## 2019-03-02 MED FILL — Mannitol IV Soln 20%: INTRAVENOUS | Qty: 500 | Status: AC

## 2019-03-02 MED FILL — Heparin Sodium (Porcine) Inj 1000 Unit/ML: INTRAMUSCULAR | Qty: 30 | Status: AC

## 2019-03-02 MED FILL — Magnesium Sulfate Inj 50%: INTRAMUSCULAR | Qty: 10 | Status: AC

## 2019-03-02 NOTE — Progress Notes (Signed)
ANTICOAGULATION CONSULT NOTE   Pharmacy Consult for heparin (TCTS dosing) Indication: LVAD  No Known Allergies  Patient Measurements: Height: 5\' 8"  (172.7 cm) Weight: 112 lb 3.4 oz (50.9 kg) IBW/kg (Calculated) : 68.4  Vital Signs: Temp: 98.7 F (37.1 C) (01/26 1615) Temp Source: Oral (01/26 1615) BP: 98/57 (01/26 1600) Pulse Rate: 59 (01/26 1600)  Labs: Recent Labs    02/28/19 0209 02/28/19 0426 03/01/19 0332 03/01/19 0644 03/01/19 1300 03/01/19 1727 03/02/19 0427 03/02/19 0427 03/02/19 0505 03/02/19 0900 03/02/19 1042 03/02/19 1430 03/02/19 1512  HGB 8.8*   < > 8.9*   < > 8.7*   < > 4.8*   < > 8.8*  --  8.6*  --   --   HCT 25.7*   < > 25.3*   < > 25.0*   < > 14.3*  --  25.4*  --  25.1*  --   --   PLT 201   < > 236   < > 255  --  137*  --   --   --  260  --   --   LABPROT 21.6*  --  18.1*  --   --   --  15.3*  --   --   --   --   --   --   INR 1.9*  --  1.5*  --   --   --  1.2  --   --   --   --   --   --   HEPARINUNFRC  --   --   --   --   --    < > <0.10*  --   --  <0.10*  --   --  <0.10*  CREATININE 0.80   < > 0.93   < > 1.07  --  0.95  --   --   --   --  0.85  --    < > = values in this interval not displayed.    Estimated Creatinine Clearance: 89 mL/min (by C-G formula based on SCr of 0.85 mg/dL).   Medical History: Past Medical History:  Diagnosis Date  . Dilated cardiomyopathy (HCC)   . Dilated cardiomyopathy (HCC) 02/2019  . Polysubstance abuse (HCC)     Medications:  Scheduled:  . acetaminophen  1,000 mg Oral Q6H   Or  . acetaminophen (TYLENOL) oral liquid 160 mg/5 mL  1,000 mg Per Tube Q6H  . aspirin EC  325 mg Oral Daily   Or  . aspirin  324 mg Per Tube Daily  . bisacodyl  10 mg Oral Daily   Or  . bisacodyl  10 mg Rectal Daily  . Chlorhexidine Gluconate Cloth  6 each Topical Daily  . digoxin  0.125 mg Oral Daily  . docusate sodium  200 mg Oral Daily  . [START ON 03/03/2019] furosemide  40 mg Oral Daily  . insulin aspart  0-5 Units  Subcutaneous QHS  . insulin aspart  0-9 Units Subcutaneous TID WC  . mouth rinse  15 mL Mouth Rinse BID  . pantoprazole  40 mg Oral Daily  . potassium chloride  40 mEq Oral BID  . potassium chloride  40 mEq Oral Once  . sodium chloride flush  10 mL Intravenous Q12H  . spironolactone  12.5 mg Oral Daily  . warfarin  5 mg Oral q1800  . Warfarin - Physician Dosing Inpatient   Does not apply q1800    Assessment: 33 yom now s/p HM3  LVAD implantation on 1/22. Started on warfarin 1/24. INR today is 1.5 -  on heparin infusion at 600 units/hr. -heparin level < 0.1  No adjustments are planned unless the level is > 0.5   Goal of Therapy:  Monitor platelets by anticoagulation protocol: Yes   Plan:  Continue heparin infusion at 600 units/hr per MD Monitor CBC, and for s/sx of bleeding  Hildred Laser, PharmD Clinical Pharmacist **Pharmacist phone directory can now be found on [email or web address] ([email or web address]).  Listed under [place].

## 2019-03-02 NOTE — Progress Notes (Addendum)
HeartMate 3 Rounding Note Heartmate 3 Implanted 02-26-2019 Subjective:    34 yo with nonischemic CM, mod RV dysfunction\and inotrope dependent Intermacs 2 cardiogenic shock POD # 4   Hx ETOH, drugs, cirrhosis HM3 implant with inotrope supported RV dysfunction Operative bronchoscopy needed for secretion induced RUL collapse- finishing 7 days Fortaz for residual infiltrate. Coagulopathy related to liver disease, HF  Phenergan induced lethargy now resolved- up in chair and ready for ambulation with PT  20 F right chest tube placed 03-01-19 for postop PNTX, now resolved with no air lleak  He has been diuresing well and has maintained sinus rhythm.Transitioning fro lasi drip to scheduled doses Epinephrine has been weaned off, inhaled nitric oxide weaned off. Cont Milrinone for RV fx. VATDparameters are satisfactory with very few PI events, flow now 4 liters   Low dose heparin started as bridge for coumadin loading, LDH 320, INR after 2 doses coumadin 1.5> 1.2  LVAD INTERROGATION:  HeartMate III LVAD:  Flow 4.1 liters/min, speed 5500, power 4.8, PI 2.4.     Objective:    Vital Signs:   Temp:  [98.2 F (36.8 C)-99.2 F (37.3 C)] 99.2 F (37.3 C) (01/26 0814) Pulse Rate:  [50-119] 90 (01/26 0700) Resp:  [14-22] 20 (01/26 0700) BP: (89-144)/(67-116) 89/67 (01/26 0700) SpO2:  [92 %-100 %] 96 % (01/26 0700) Arterial Line BP: (93-133)/(57-91) 100/58 (01/26 0200) FiO2 (%):  [40 %] 40 % (01/25 1141) Weight:  [50.9 kg] 50.9 kg (01/26 0636) Last BM Date: 02/25/19 Mean arterial Pressure 80-90  Intake/Output:   Intake/Output Summary (Last 24 hours) at 03/02/2019 1950 Last data filed at 03/02/2019 0830 Gross per 24 hour  Intake 1584.29 ml  Output 3740 ml  Net -2155.71 ml     Physical Exam: HEENT: normal Wounds: sternal incision and DL exit clean and dry General:  Extubated, neuro intact, c/o surgical pain  ENT : JVP normal Carotids; no bruits. No lymphadenopathy or thryomegaly  appreciated. Cor: Mechanical heart sounds with LVAD hum present. Lungs: clear Abdomen: soft, nontender, nondistended. No hepatosplenomegaly. No bruits or masses. Good bowel sounds. Extremities: no cyanosis, clubbing, rash, edema Neuro: alert & orientedx3, cranial nerves grossly intact. moves all 4 extremities w/o difficulty. Affect pleasant  Telemetry: nsr rate 100  Labs: Basic Metabolic Panel: Recent Labs  Lab 02/27/19 0430 02/27/19 0441 02/27/19 1422 02/27/19 1422 02/28/19 0209 02/28/19 0426 02/28/19 1609 02/28/19 1609 03/01/19 0332 03/01/19 0332 03/01/19 0644 03/01/19 0846 03/01/19 1300 03/02/19 0358 03/02/19 0427  NA 135   < > 136   < > 131*   < > 129*   < > 129*   < > 128* 127* 128* 132* 134*  K 4.4   < > 3.4*   < > 4.1   < > 3.5   < > 3.4*   < > 3.8 4.3 3.3* 2.6* 2.6*  CL 103   < > 104   < > 100  --  97*  --  92*  --   --   --  91*  --  97*  CO2 23   < > 21*   < > 18*  --  21*  --  22  --   --   --  21*  --  24  GLUCOSE 134*   < > 134*   < > 151*  --  133*  --  114*  --   --   --  118*  --  92  BUN 13   < >  10   < > 11  --  11  --  13  --   --   --  16  --  16  CREATININE 0.76   < > 0.70   < > 0.80  --  0.88  --  0.93  --   --   --  1.07  --  0.95  CALCIUM 8.2*   < > 8.4*   < > 8.6*   < > 8.8*   < > 8.8*  --   --   --  8.5*  --  8.0*  MG 2.3  --  2.0  --  1.9  --   --   --  1.8  --   --   --   --   --  1.7  PHOS 4.0  --   --   --  3.7  --   --   --  4.7*  --   --   --   --   --  2.9   < > = values in this interval not displayed.    Liver Function Tests: Recent Labs  Lab 02/24/19 0445 02/27/19 0430 02/28/19 0209 03/01/19 0332 03/02/19 0427  AST 41 132* 86* 54* 37  ALT 33 30 27 28 23   ALKPHOS 63 47 54 91 66  BILITOT 0.7 4.7* 5.5* 5.0* 2.4*  PROT 7.1 5.6* 5.7* 6.1* 5.7*  ALBUMIN 3.0* 3.2* 2.8* 2.7* 2.4*   No results for input(s): LIPASE, AMYLASE in the last 168 hours. No results for input(s): AMMONIA in the last 168 hours.  CBC: Recent Labs  Lab  02/27/19 0430 02/27/19 0441 02/28/19 0209 02/28/19 0426 02/28/19 1609 02/28/19 1609 03/01/19 0332 03/01/19 0644 03/01/19 0846 03/01/19 1300 03/02/19 0358 03/02/19 0427 03/02/19 0505  WBC 11.6*   < > 17.8*  --  20.5*  --  18.8*  --   --  18.5*  --  8.4  --   NEUTROABS 8.7*  --  14.1*  --   --   --  14.7*  --   --   --   --  5.9  --   HGB 9.7*   < > 8.8*   < > 9.4*   < > 8.9*   < > 8.8* 8.7* 9.2* 4.8* 8.8*  HCT 28.1*   < > 25.7*   < > 27.2*   < > 25.3*   < > 26.0* 25.0* 27.0* 14.3* 25.4*  MCV 92.7   < > 92.8  --  90.4  --  90.0  --   --  89.9  --  91.7  --   PLT 191   < > 201  --  242  --  236  --   --  255  --  137*  --    < > = values in this interval not displayed.    INR: Recent Labs  Lab 02/26/19 1411 02/27/19 0430 02/28/19 0209 03/01/19 0332 03/02/19 0427  INR 1.3* 1.3* 1.9* 1.5* 1.2    Other results:  EKG:   Imaging: DG Chest Port 1 View  Result Date: 03/02/2019 CLINICAL DATA:  LVAD EXAM: PORTABLE CHEST 1 VIEW COMPARISON:  Radiograph 03/01/2019 FINDINGS: Stable cardiomegaly post median sternotomy left ventricular assist device placement. Right chest tube remains in place. Right upper extremity PICC terminates at the level of the right atrium. Some bandlike opacities in the lungs likely reflect areas of subsegmental atelectasis. Mild central vascular congestion is similar to prior. Suspect  at least trace left pleural effusion. No right effusion or pneumothorax is as been and. Small amount of subcutaneous emphysema in the right chest wall. IMPRESSION: 1. Stable cardiomegaly post median sternotomy and left ventricular assist device placement. 2. Probable areas of subsegmental atelectasis in the lungs. 3. Suspect at least trace left pleural effusion. Electronically Signed   By: Kreg Shropshire M.D.   On: 03/02/2019 06:21   DG Chest Port 1 View  Result Date: 03/01/2019 CLINICAL DATA:  Chest tube insertion EXAM: PORTABLE CHEST 1 VIEW COMPARISON:  03/01/2019, 7:44 a.m.  FINDINGS: Interval placement of a right-sided chest tube, tip projecting about the peripheral right upper lobe. There has been complete resolution of previously seen small right apical pneumothorax. Bandlike scarring or atelectasis of the right midlung. Previously seen left-sided chest tube has been removed. Cardiomegaly status post median sternotomy and LVAD placement. IMPRESSION: 1. Interval placement of a right-sided chest tube with complete resolution of previously seen small right apical pneumothorax. 2. Interval removal of left-sided chest tube. 3. Cardiomegaly status post median sternotomy and LVAD placement. Electronically Signed   By: Lauralyn Primes M.D.   On: 03/01/2019 11:48   DG CHEST PORT 1 VIEW  Addendum Date: 03/01/2019   ADDENDUM REPORT: 03/01/2019 08:25 ADDENDUM: These results were called by telephone at the time of interpretation on 03/01/2019 at 8:22 a.m. to provider Dr. Kathlee Nations TRIGT's nurse in the OR, Chloe, who verbally acknowledged these results and will communicate to Dr. Donata Clay. Electronically Signed   By: Elberta Fortis M.D.   On: 03/01/2019 08:25   Result Date: 03/01/2019 CLINICAL DATA:  Right-sided chest tube.  BiPAP dependent. EXAM: PORTABLE CHEST 1 VIEW COMPARISON:  02/28/2019 FINDINGS: Right IJ central venous sheath is in place. Medial right apical chest tube in place versus mediastinal drain. Left-sided chest tube unchanged. LVAD unchanged. Interval development of moderate size right pneumothorax. Stable opacification over the right mid to upper lung. 1.5 cm nodular opacity along the right heart border. Stable left retrocardiac opacification. Stable cardiomegaly. Remainder of the exam is unchanged. IMPRESSION: 1. Interval development of moderate size right pneumothorax. Medial right upper chest tube versus mediastinal drain unchanged. 2. Stable opacification over the right midlung with nodular opacification in the right infrahilar region along the right heart border. Stable  left base opacification. 3.  Stable cardiomegaly. 4.  Tubes and lines as described. Electronically Signed: By: Elberta Fortis M.D. On: 03/01/2019 08:12   [place] EKG SITE RITE  Result Date: 03/01/2019 If Site Rite image not attached, placement could not be confirmed due to current cardiac rhythm.    Medications:     Scheduled Medications: . acetaminophen  1,000 mg Oral Q6H   Or  . acetaminophen (TYLENOL) oral liquid 160 mg/5 mL  1,000 mg Per Tube Q6H  . aspirin EC  325 mg Oral Daily   Or  . aspirin  324 mg Per Tube Daily  . bisacodyl  10 mg Oral Daily   Or  . bisacodyl  10 mg Rectal Daily  . Chlorhexidine Gluconate Cloth  6 each Topical Daily  . digoxin  0.125 mg Oral Daily  . docusate sodium  200 mg Oral Daily  . [START ON 03/03/2019] furosemide  40 mg Oral Daily  . hydrALAZINE  25 mg Oral Q6H  . insulin aspart  0-5 Units Subcutaneous QHS  . insulin aspart  0-9 Units Subcutaneous TID WC  . levalbuterol  1.25 mg Nebulization TID  . mouth rinse  15 mL Mouth Rinse  BID  . pantoprazole  40 mg Oral Daily  . potassium chloride  40 mEq Oral BID  . sodium chloride flush  10 mL Intravenous Q12H  . spironolactone  12.5 mg Oral Daily  . warfarin  5 mg Oral q1800  . Warfarin - Physician Dosing Inpatient   Does not apply q1800    Infusions: . sodium chloride Stopped (02/28/19 0856)  . sodium chloride    . sodium chloride Stopped (03/02/19 0747)  . cefTAZidime (FORTAZ)  IV Stopped (03/02/19 2563)  . furosemide (LASIX) infusion    . heparin 600 Units/hr (03/02/19 0830)  . milrinone 0.375 mcg/kg/min (03/02/19 0830)  . potassium chloride      PRN Medications: sodium chloride, hydrALAZINE, ondansetron (ZOFRAN) IV, oxyCODONE, sodium chloride flush, traMADol   Assessment:  Postop coagulopathy- improved\ Only 4 PI events for 24 hrs VAD parameters satisfactorywith flow 4.2, coox 68% Airway secretions/ RUL atelectasis- improved but not cleared. R pneumothorax resolved wit small chest  tube Postop bilrubin 4.2 >> 5.4>> 5.0 >> 2.4, follow Postop elevated MAP- now improved with po apresoline Finish 1 week Fortaz for poss pneumonia Plan/Discussion:     Leave pocket drain, R pleural tube another day Continue milrinone for RV support Ambulate with PT Start full liq diet and advance  INR down to 1.3-we will start low-dose heparin bridge and continue oral Coumadin dosing.  I reviewed the LVAD parameters from today, and compared the results to the patient's prior recorded data.  No programming changes were made.  The LVAD is functioning within specified parameters.  The patient performs LVAD self-test daily.  LVAD interrogation was negative for any significant power changes, alarms or PI events/speed drops.  LVAD equipment check completed and is in good working order.  Back-up equipment present.   LVAD education done on emergency procedures and precautions and reviewed exit site care.  Length of Stay: [address]  Kathlee Nations Fuig III 03/02/2019, 9:27 AM

## 2019-03-02 NOTE — Evaluation (Signed)
Physical Therapy Evaluation Patient Details Name: Jon Martin MRN: [id number] DOB: [birth year] Today's Date: 03/02/2019   History of Present Illness  This 34 y.o. male admitted with acute/chronic systolic HF  with cardiogenic shock.  He underwent placement of HM 3 LVAD on 02/26/2019. Post op he developed RUL PNA, as well as acute blood loss anemia. CXR 1/25 w/ new moderate size right pneumothorax s/p CT placement. PMH includes: h/o poly substance abuse, dilated cardiomyopathy  Clinical Impression  Patient presents with pain, decreased activity tolerance and impaired mobility s/p above. Pt independent PTA. Today, pt requires Min guard-supervision for safety during mobility. Second person helpful for lines. Education on sternal precautions and bracing etc. Total A to change LVAD from wall to/from batteries as pt had just woken up. Likely will be ready to assist with this next session. VSS throughout. Will follow acutely to maximize independence and mobility prior to return home.    Follow Up Recommendations Home health PT;Supervision for mobility/OOB    Equipment Recommendations  Other (comment)(TBA)    Recommendations for Other Services       Precautions / Restrictions Precautions Precautions: Sternal;Other (comment) Precaution Comments: Pt verbally instructed in sternal precautions, 2 chest tubes. Restrictions Weight Bearing Restrictions: Yes Other Position/Activity Restrictions: sternal precautions      Mobility  Bed Mobility Overal bed mobility: Needs Assistance Bed Mobility: Supine to Sit     Supine to sit: Supervision;HOB elevated     General bed mobility comments: Supervision for safety/lines, no use of UEs.  Transfers Overall transfer level: Needs assistance Equipment used: None Transfers: Sit to/from Stand Sit to Stand: Min guard         General transfer comment: Min guard for safety. Stood from [employer].  Ambulation/Gait Ambulation/Gait assistance:  Supervision Gait Distance (Feet): 500 Feet Assistive device: ([name]) Gait Pattern/deviations: Step-through pattern Gait velocity: good speed Gait velocity interpretation: >2.62 ft/sec, indicative of community ambulatory General Gait Details: Fast, steady gait using [name], 2/4 DOE. VSS.  Stairs            Wheelchair Mobility    Modified Rankin (Stroke Patients Only)       Balance Overall balance assessment: Needs assistance Sitting-balance support: Feet supported;No upper extremity supported Sitting balance-Leahy Scale: Good     Standing balance support: During functional activity Standing balance-Leahy Scale: Fair Standing balance comment: Able to stand statically wihtout UE support, close Min guard.                             Pertinent Vitals/Pain Pain Assessment: Faces Faces Pain Scale: Hurts little more Pain Location: ches tube insertion site Pain Descriptors / Indicators: Discomfort;Guarding Pain Intervention(s): Monitored during session    Home Living Family/patient expects to be discharged to:: Private residence Living Arrangements: Parent Available Help at Discharge: Family;Available 24 hours/day Type of Home: House Home Access: Stairs to enter   [employer] of Steps: 3 Home Layout: One level;Laundry or work area in [employer]: None      Prior Function Level of Independence: Independent         Comments: Works, does not drive.     Hand Dominance   Dominant Hand: Right    Extremity/Trunk Assessment   Upper Extremity Assessment Upper Extremity Assessment: Defer to OT evaluation    Lower Extremity Assessment Lower Extremity Assessment: Overall WFL for tasks assessed    Cervical / Trunk Assessment Cervical / Trunk Assessment: Normal  Communication  Communication: No difficulties  Cognition Arousal/Alertness: Awake/alert Behavior During Therapy: WFL for tasks assessed/performed Overall  Cognitive Status: Within Functional Limits for tasks assessed                                 General Comments: grossly assessed       General Comments General comments (skin integrity, edema, etc.): RN present to assist with lines. VSS. LVAD parameters stable during activity.    Exercises     Assessment/Plan    PT Assessment Patient needs continued PT services  PT Problem List Pain;Decreased skin integrity;Cardiopulmonary status limiting activity;Decreased activity tolerance;Decreased balance;Decreased knowledge of precautions       PT Treatment Interventions Gait training;Therapeutic exercise;Patient/family education;Balance training;Stair training;Functional mobility training;Therapeutic activities;DME instruction    PT Goals (Current goals can be found in the Care Plan section)  Acute Rehab PT Goals Patient Stated Goal: to get better  PT Goal Formulation: With patient Time For Goal Achievement: 03/16/19 Potential to Achieve Goals: Good    Frequency Min 3X/week   Barriers to discharge        Co-evaluation               AM-PAC PT "6 Clicks" Mobility  Outcome Measure Help needed turning from your back to your side while in a flat bed without using bedrails?: None Help needed moving from lying on your back to sitting on the side of a flat bed without using bedrails?: None Help needed moving to and from a bed to a chair (including a wheelchair)?: A Little Help needed standing up from a chair using your arms (e.g., wheelchair or bedside chair)?: A Little Help needed to walk in hospital room?: A Little Help needed climbing 3-5 steps with a railing? : A Little 6 Click Score: 20    End of Session   Activity Tolerance: Patient tolerated treatment well Patient left: in bed;with call bell/phone within reach;with nursing/sitter in room Nurse Communication: Mobility status PT Visit Diagnosis: Pain Pain - part of body: (chest tube site)    Time:  2725-3664 PT Time Calculation (min) (ACUTE ONLY): 27 min   Charges:   PT Evaluation $PT Eval Moderate Complexity: 1 Mod PT Treatments $Gait Training: 8-22 mins        Jon Martin, PT, DPT Acute Rehabilitation Services Pager [phone] Office [phone]      Jon Martin 03/02/2019, 3:12 PM

## 2019-03-02 NOTE — Progress Notes (Signed)
ANTICOAGULATION CONSULT NOTE   Pharmacy Consult for heparin (TCTS dosing) Indication: LVAD  No Known Allergies  Patient Measurements: Height: 5\' 8"  (172.7 cm) Weight: 112 lb 3.4 oz (50.9 kg) IBW/kg (Calculated) : 68.4  Vital Signs: Temp: 99.2 F (37.3 C) (01/26 0814) Temp Source: Oral (01/26 0814) BP: 101/75 (01/26 1312) Pulse Rate: 63 (01/26 1312)  Labs: Recent Labs    02/28/19 0209 02/28/19 0426 03/01/19 0332 03/01/19 0644 03/01/19 1300 03/01/19 1727 03/02/19 0358 03/02/19 0427 03/02/19 0427 03/02/19 0505 03/02/19 0900 03/02/19 1042  HGB 8.8*   < > 8.9*   < > 8.7*  --    < > 4.8*   < > 8.8*  --  8.6*  HCT 25.7*   < > 25.3*   < > 25.0*  --    < > 14.3*  --  25.4*  --  25.1*  PLT 201   < > 236   < > 255  --   --  137*  --   --   --  260  LABPROT 21.6*  --  18.1*  --   --   --   --  15.3*  --   --   --   --   INR 1.9*  --  1.5*  --   --   --   --  1.2  --   --   --   --   HEPARINUNFRC  --   --   --   --   --  <0.10*  --  <0.10*  --   --  <0.10*  --   CREATININE 0.80   < > 0.93  --  1.07  --   --  0.95  --   --   --   --    < > = values in this interval not displayed.    Estimated Creatinine Clearance: 79.6 mL/min (by C-G formula based on SCr of 0.95 mg/dL).   Medical History: Past Medical History:  Diagnosis Date  . Dilated cardiomyopathy (HCC)   . Dilated cardiomyopathy (HCC) 02/2019  . Polysubstance abuse (HCC)     Medications:  Scheduled:  . acetaminophen  1,000 mg Oral Q6H   Or  . acetaminophen (TYLENOL) oral liquid 160 mg/5 mL  1,000 mg Per Tube Q6H  . aspirin EC  325 mg Oral Daily   Or  . aspirin  324 mg Per Tube Daily  . bisacodyl  10 mg Oral Daily   Or  . bisacodyl  10 mg Rectal Daily  . Chlorhexidine Gluconate Cloth  6 each Topical Daily  . digoxin  0.125 mg Oral Daily  . docusate sodium  200 mg Oral Daily  . feeding supplement (ENSURE ENLIVE)  237 mL Oral TID WC & HS  . [START ON 03/03/2019] furosemide  40 mg Oral Daily  . hydrALAZINE  25  mg Oral Q6H  . insulin aspart  0-5 Units Subcutaneous QHS  . insulin aspart  0-9 Units Subcutaneous TID WC  . levalbuterol  1.25 mg Nebulization TID  . mouth rinse  15 mL Mouth Rinse BID  . pantoprazole  40 mg Oral Daily  . potassium chloride  40 mEq Oral BID  . sodium chloride flush  10 mL Intravenous Q12H  . spironolactone  12.5 mg Oral Daily  . warfarin  5 mg Oral q1800  . Warfarin - Physician Dosing Inpatient   Does not apply q1800    Assessment: 33 yom now s/p HM3 LVAD implantation  on 1/22. Started on warfarin 1/24. INR today is 1.5 - started on heparin infusion at 500 units/hr.  Hgb 8.6, plt ok. No infusion issues - did have bleeding at IJ site, stabilized once held pressure. Heparin level is undetectable.   Goal of Therapy:  Monitor platelets by anticoagulation protocol: Yes   Plan:  Continue heparin infusion at 600 units/hr per MD Monitor CBC, and for s/sx of bleeding  Marguerite Olea, [place] Pharmacist Phone [phone]  03/02/2019 2:46 PM

## 2019-03-02 NOTE — Progress Notes (Signed)
RN stated she would call box to report ABG.

## 2019-03-02 NOTE — Progress Notes (Signed)
LVAD coordinator changed VAD dressing, no signs of infection or skin breakdown.  Dressing due to be changed tomorrow 03/03/19

## 2019-03-02 NOTE — Progress Notes (Signed)
PT Cancellation Note  Patient Details Name: Jon Martin MRN: [id number] DOB: 05/31/1985   Cancelled Treatment:    Reason Eval/Treat Not Completed: Patient at procedure or test/unavailable Pt getting procedure done at bedside. Will follow.   [name] A Starla Deller 03/02/2019, 11:17 AM [place], PT, DPT Acute Rehabilitation Services Pager [phone] Office [phone]

## 2019-03-02 NOTE — Progress Notes (Addendum)
Patient ID: Jon Martin, male   DOB: 06-Jan-1986, 34 y.o.   MRN: [id number]     Advanced Heart Failure Rounding Note  PCP-Cardiologist: No primary care provider on file.   Subjective:    34 y.o. 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  Underwent HM-3 placement 02/26/2019.  Extubated 1/23. Placed on  BiPAP yesterday. CXR 1/25 w/ new moderate size right pneumothorax s/p CT placement. Breathing improved. Of bipap and on [place] 5L/min.   Remains on abx, Ceftaz, for RUL infiltrate. AF. WBC ct WNL    On milrinone 0.375. Co-ox 69 %.  Good UOP on lasix gtt, 3.8 L out yesterday. Reported wt change -16 lb. ? If accurate. SCr stable. Hypokalemic at 2.6  IV heparin turned off temporarily overnight due to bleeding from Rt IJ and radial IV site, now resolved. Hgb 8.8 this am.   Out of bed and in chair. No complaints this am.    LVAD Interrogation HM 3: Speed: 5500 Flow: 4.4 PI: 3.3 Power: 4.0 VAD interrogated personally. Parameters stable.  Objective:   Weight Range: 50.9 kg Body mass index is 17.06 kg/m.   Vital Signs:   Temp:  [97.8 F (36.6 C)-98.2 F (36.8 C)] 98.2 F (36.8 C) (01/25 1946) Pulse Rate:  [50-119] 114 (01/26 0600) Resp:  [14-23] 21 (01/26 0600) BP: (91-144)/(75-116) 97/82 (01/26 0600) SpO2:  [92 %-100 %] 100 % (01/26 0600) Arterial Line BP: (93-133)/(57-91) 100/58 (01/26 0200) FiO2 (%):  [40 %] 40 % (01/25 1141) Weight:  [50.9 kg] 50.9 kg (01/26 0636) Last BM Date: 02/25/19  Weight change: Filed Weights   02/27/19 0500 02/28/19 0429 03/02/19 0636  Weight: 52.8 kg 58.1 kg 50.9 kg    Intake/Output:   Intake/Output Summary (Last 24 hours) at 03/02/2019 0751 Last data filed at 03/02/2019 0700 Gross per 24 hour  Intake 1739.44 ml  Output 4255 ml  Net -2515.56 ml      Physical Exam   CVP 11 General:  Thin, Young AAM. No respiratory difficulty  HEENT: normal  Neck: supple. RT IJ site bandaged. No further bleeding Carotids 2+  bilat; no bruits. No lymphadenopathy or thryomegaly appreciated. Cor: Sternal wound healing well. No drainage.  + Rt CT. + LVAD hum.  Lungs: decreased BS at the bases  Abdomen:  soft, nontender, nondistended. No hepatosplenomegaly. No bruits or masses. Hypoactive bowel sounds. Driveline site clean. Anchor in place.  Extremities: no cyanosis, clubbing, rash. No edema  Neuro: weak nonfocal   Telemetry  Sinus tachycardia, low 100s-110s   Labs    CBC Recent Labs    03/01/19 0332 03/01/19 0644 03/01/19 1300 03/02/19 0358 03/02/19 0427 03/02/19 0505  WBC 18.8*   < > 18.5*  --  8.4  --   NEUTROABS 14.7*  --   --   --  5.9  --   HGB 8.9*   < > 8.7*   < > 4.8* 8.8*  HCT 25.3*   < > 25.0*   < > 14.3* 25.4*  MCV 90.0   < > 89.9  --  91.7  --   PLT 236   < > 255  --  137*  --    < > = values in this interval not displayed.   Basic Metabolic Panel Recent Labs    00/86/76 0332 03/01/19 0644 03/01/19 1300 03/01/19 1300 03/02/19 0358 03/02/19 0427  NA 129*   < > 128*   < > 132* 134*  K 3.4*   < >  3.3*   < > 2.6* 2.6*  CL 92*   < > 91*  --   --  97*  CO2 22   < > 21*  --   --  24  GLUCOSE 114*   < > 118*  --   --  92  BUN 13   < > 16  --   --  16  CREATININE 0.93   < > 1.07  --   --  0.95  CALCIUM 8.8*   < > 8.5*  --   --  8.0*  MG 1.8  --   --   --   --  1.7  PHOS 4.7*  --   --   --   --  2.9   < > = values in this interval not displayed.   Liver Function Tests Recent Labs    03/01/19 0332 03/02/19 0427  AST 54* 37  ALT 28 23  ALKPHOS 91 66  BILITOT 5.0* 2.4*  PROT 6.1* 5.7*  ALBUMIN 2.7* 2.4*   No results for input(s): LIPASE, AMYLASE in the last 72 hours. Cardiac Enzymes No results for input(s): CKTOTAL, CKMB, CKMBINDEX, TROPONINI in the last 72 hours.  BNP: BNP (last 3 results) Recent Labs    02/10/19 0219 02/27/19 0433  BNP 2,048.6* 447.0*    ProBNP (last 3 results) No results for input(s): PROBNP in the last 8760 hours.   D-Dimer No results for  input(s): DDIMER in the last 72 hours. Hemoglobin A1C No results for input(s): HGBA1C in the last 72 hours. Fasting Lipid Panel No results for input(s): CHOL, HDL, LDLCALC, TRIG, CHOLHDL, LDLDIRECT in the last 72 hours. Thyroid Function Tests No results for input(s): TSH, T4TOTAL, T3FREE, THYROIDAB in the last 72 hours.  Invalid input(s): FREET3  Other results:   Imaging    DG Chest Port 1 View  Result Date: 03/02/2019 CLINICAL DATA:  LVAD EXAM: PORTABLE CHEST 1 VIEW COMPARISON:  Radiograph 03/01/2019 FINDINGS: Stable cardiomegaly post median sternotomy left ventricular assist device placement. Right chest tube remains in place. Right upper extremity PICC terminates at the level of the right atrium. Some bandlike opacities in the lungs likely reflect areas of subsegmental atelectasis. Mild central vascular congestion is similar to prior. Suspect at least trace left pleural effusion. No right effusion or pneumothorax is as been and. Small amount of subcutaneous emphysema in the right chest wall. IMPRESSION: 1. Stable cardiomegaly post median sternotomy and left ventricular assist device placement. 2. Probable areas of subsegmental atelectasis in the lungs. 3. Suspect at least trace left pleural effusion. Electronically Signed   By: Kreg Shropshire M.D.   On: 03/02/2019 06:21   DG Chest Port 1 View  Result Date: 03/01/2019 CLINICAL DATA:  Chest tube insertion EXAM: PORTABLE CHEST 1 VIEW COMPARISON:  03/01/2019, 7:44 a.m. FINDINGS: Interval placement of a right-sided chest tube, tip projecting about the peripheral right upper lobe. There has been complete resolution of previously seen small right apical pneumothorax. Bandlike scarring or atelectasis of the right midlung. Previously seen left-sided chest tube has been removed. Cardiomegaly status post median sternotomy and LVAD placement. IMPRESSION: 1. Interval placement of a right-sided chest tube with complete resolution of previously seen small  right apical pneumothorax. 2. Interval removal of left-sided chest tube. 3. Cardiomegaly status post median sternotomy and LVAD placement. Electronically Signed   By: Lauralyn Primes M.D.   On: 03/01/2019 11:48   DG CHEST PORT 1 VIEW  Addendum Date: 03/01/2019   ADDENDUM REPORT:  03/01/2019 08:25 ADDENDUM: These results were called by telephone at the time of interpretation on 03/01/2019 at 8:22 a.m. to provider Dr. Tharon Aquas TRIGT's nurse in the [place], who verbally acknowledged these results and will communicate to Dr. Prescott Gum. Electronically Signed   By: Marin Olp M.D.   On: 03/01/2019 08:25   Result Date: 03/01/2019 CLINICAL DATA:  Right-sided chest tube.  BiPAP dependent. EXAM: PORTABLE CHEST 1 VIEW COMPARISON:  02/28/2019 FINDINGS: Right IJ central venous sheath is in place. Medial right apical chest tube in place versus mediastinal drain. Left-sided chest tube unchanged. LVAD unchanged. Interval development of moderate size right pneumothorax. Stable opacification over the right mid to upper lung. 1.5 cm nodular opacity along the right heart border. Stable left retrocardiac opacification. Stable cardiomegaly. Remainder of the exam is unchanged. IMPRESSION: 1. Interval development of moderate size right pneumothorax. Medial right upper chest tube versus mediastinal drain unchanged. 2. Stable opacification over the right midlung with nodular opacification in the right infrahilar region along the right heart border. Stable left base opacification. 3.  Stable cardiomegaly. 4.  Tubes and lines as described. Electronically Signed: By: Marin Olp M.D. On: 03/01/2019 08:12     Medications:     Scheduled Medications: . acetaminophen  1,000 mg Oral Q6H   Or  . acetaminophen (TYLENOL) oral liquid 160 mg/5 mL  1,000 mg Per Tube Q6H  . aspirin EC  325 mg Oral Daily   Or  . aspirin  324 mg Per Tube Daily   Or  . aspirin  300 mg Rectal Daily  . bisacodyl  10 mg Oral Daily   Or  . bisacodyl  10  mg Rectal Daily  . Chlorhexidine Gluconate Cloth  6 each Topical Daily  . digoxin  0.125 mg Oral Daily  . docusate sodium  200 mg Oral Daily  . hydrALAZINE  25 mg Oral Q6H  . insulin aspart  0-24 Units Subcutaneous Q4H  . levalbuterol  1.25 mg Nebulization TID  . mouth rinse  15 mL Mouth Rinse BID  . pantoprazole  40 mg Oral Daily  . sodium chloride flush  10 mL Intravenous Q12H  . sodium chloride flush  10-40 mL Intracatheter Q12H  . sodium chloride flush  3 mL Intravenous Q12H  . spironolactone  12.5 mg Oral Daily  . warfarin  4 mg Oral q1800  . Warfarin - Physician Dosing Inpatient   Does not apply q1800    Infusions: . sodium chloride Stopped (02/28/19 0856)  . sodium chloride    . sodium chloride Stopped (03/02/19 0634)  . cefTAZidime (FORTAZ)  IV Stopped (03/02/19 7893)  . famotidine (PEPCID) IV Stopped (03/01/19 1226)  . furosemide (LASIX) infusion 10 mg/hr (03/02/19 0700)  . heparin 400 Units/hr (03/02/19 0700)  . lactated ringers    . lactated ringers Stopped (03/01/19 1800)  . milrinone 0.375 mcg/kg/min (03/02/19 0508)  . nitroGLYCERIN Stopped (03/01/19 1756)  . potassium chloride 10 mEq (03/02/19 0747)    PRN Medications: sodium chloride, hydrALAZINE, midazolam, ondansetron (ZOFRAN) IV, oxyCODONE, sodium chloride flush, sodium chloride flush, sodium chloride flush, traMADol     Assessment/Plan   1. Acute/Chronic Systolic HF -> cardiogenic shock - ECHO at [facility] EF, 20%.  - ECHO repeated and showed severely reduced EF <20%. RV only mildly down - R/L cath 1/7 normal cors. Output ok   - s/p HM-3 on 02/26/2019 - On milrinone 0.375. Co-ox 69%  - VAD interrogated personally. Parameters stable.  - CVP improved, down  to 11 today. Wt trending down. SCr stable. Continue lasix gtt today at 10 mg/hr. supp K.   2. VAD -  s/p HM-3 on 02/26/2019 -  RV down some. Continue milrinone for RV support  -  VAD interrogated personally. Parameters stable. -  Add sildenafil as  more stable - LDH 418 -> 431->412->373 - Warfarin started with low dose heparin bridge  - INR 1.2  3. Acute post-op respiratory failure with RUL PNA - Extubated 1/23.  - Now with RUL PNA. Ceftaz started 1/24.  - sputum cx pending - Afebrile, WBC downtrending - Add vanc as needed - Encourage IS  4. Acute blood loss anemia - keep HGB > 8.0 - monitor closely while on coumadin + heparin bridge   5. Elevated bilirubin - 0.7 -> 4.7 -> 5.5 -> 5.0 - likely combination of RV failure and underlying liver disease - hopefully will improved with diuresis and liver decongestion.   6. Moderate size right pneumothorax: new on CXR 1/25.  - s/p placement for Rt sided CT. F/u CXR showed complete resolution of apical pneumothorax.   7. Hypokalemia: - K 2.6 - Aggressive supplementation w/ IV KCl 10 mEq q1h x5 + PO Kdur + 40 mEq BID - Check Mg - Repeat K lab at 1400   8. Deconditioning: - try ambulating today.    Length of Stay: [address], PA-C  03/02/2019, 7:51 AM  Advanced Heart Failure Team Pager [phone] (M-F; 7a - 4p)  Please contact [facility] for night-coverage after hours (4p -7a ) and weekends on [email or web address]  Improved today. Remains on mirlinone 0.375. Co-ox 69% CVP 11-12. On lasix gtt with good diuresis. Still about 5 pounds up from baseline. R PTX improved with CT. Belly waking up. VAD interrogated personally. Parameters stable. K 2.6  General:  Sitting up in chair NAD HEENT: normal  Neck: supple. JVP 10-11.  Carotids 2+ bilat; no bruits. No lymphadenopathy or thryomegaly appreciated. Cor: LVAD hum. Sternal dressing ok Lungs: Clear. R CT Abdomen: obese soft, nontender, non-distended. No hepatosplenomegaly. No bruits or masses. Good bowel sounds. Driveline site clean. Anchor in place.  Extremities: no cyanosis, clubbing, rash. Warm tr edema  Neuro: alert & oriented x 3. No focal deficits. Moves all 4 without problem   Continue lasix gtt until this afternoon.  Then switch to lasix 40 po daily for. Drop milrinone to 0.25. Mobilize. Supp k aggressively. Continue warfarin. Heparin at 600u/hr. Discussed dosing with PharmD personally.  Arvilla Meres, MD  10:05 AM

## 2019-03-02 NOTE — Progress Notes (Signed)
LVAD Coordinator Rounding Note:  Admitted 02/10/19 due to CHF from OSH .   HM3 LVAD implanted on 02/26/19 by PVT under DT criteria.  Pt asleep in chair, curled on right side. Arouses to verbal stimuli, denies pain, but remains drowsy.   Per BS nurse, arterial line and Swan introducer d/c'd with bleeding reported overnight; heparin off for period of time, on this am. No further bleeding at this time. Right brachial PICC placed yesterday.   BS nurse reports pt was OOB and walked from bed to door - back to chair. Goal today will be to walk in hallway, pt agrees with plan.   Vital signs: Temp: 99.2 HR: 117 Doppler Pressure: 77 Automatic cuff: 89/67 (75) O2 Sat: 95% 2 L/[place] Wt: 106.4>116.4>128>112 lbs    LVAD interrogation reveals:  Speed: 5500 Flow: 4.3 Power: 4.3w PI: 3.3 Alarms: none Events:  5 PI events  Hematocrit: 27  Fixed speed: 5500 Low speed limit: 5200   Drive Line: CDI. Anchor secure. Daily dressing changes per VAD Coordinator or Nurse Alla Feeling.    Labs:  LDH trend: 243>418>431>412>373  INR trend: 1.3>1.9>1.5>1.2  Anticoagulation Plan: -INR Goal: 2-2.5 -ASA Dose: 325 mg until INR theapeutic  Blood Products:  -Intra op: 2 u PRBCS 6 FFP 1 PLT  Device: -N/A  Gtts: Milrinone 0.375 mcg/kg/min Lasix 10 mg/hr Heparin 400 units/hr   Respiratory: Extubated 02/27/19 03/01/19>>40% BiPAP added   03/01/19>>CT placed for right pnuemothorax 03/02/19>>O2 per [place]  Arrhythmia:  - 02/28/19>>Amiodarone 30 mg/hr started for atrial tachycardia  Nitric Oxide:  03/02/19>>off   VAD Education: 1. Pt drowsy, up in chair.  2. Will review dressing change and controller self test when more awake.   Plan/Recommendations:   1. Page VAD coordinator w/any equipment issues or concerns or driveline management questions.  [name] RN, BSN VAD Coordinator 24/7 Pager [phone]

## 2019-03-02 NOTE — Progress Notes (Signed)
 Nutrition Follow-up  DOCUMENTATION CODES:   Underweight, Severe malnutrition in context of chronic illness  INTERVENTION:   Ensure Enlive po QID, each supplement provides 350 kcal and 20 grams of protein   NUTRITION DIAGNOSIS:   Severe Malnutrition related to chronic illness(CHF) as evidenced by severe fat depletion, severe muscle depletion.  Being addressed via supplements  GOAL:   Patient will meet greater than or equal to 90% of their needs  Progressing  MONITOR:   PO intake, Supplement acceptance, Labs, Weight trends, I & O's  REASON FOR ASSESSMENT:   Consult LVAD Eval  ASSESSMENT:   34 year old male with PMH of NIDCM, EF 20%, LifeVest, EtOH abuse, drug abuse. Pt [facility] for consideration for LVAD.  1/22 HeartMate III LVAD placed 1/23 Extubated 1/25 Bipap, Chest tube placed for postop pneumothorax  Off BiPap currently on 5L [place]  Diet advanced to [place]. No recorded po intake since 1/21. Pt previously with very good appetite pre-LVAD placement and drinking Ensure Enlive shakes well; plan to re-order  Chest tubes x 5 with minimal output  Net negative 17 L since admission; current wt 60.9 kg; admission weight 56.2 kg  Labs: reviewed Meds: lasix drip, ss novolog, KCl  Diet Order:   Diet Order            Diet full liquid Room service appropriate? Yes; Fluid consistency: Thin  Diet effective now              EDUCATION NEEDS:   Education needs have been addressed  Skin:  Skin Assessment: Reviewed RN Assessment  Last BM:  1/21  Height:   Ht Readings from Last 1 Encounters:  02/10/19 5\' 8"  (1.727 m)    Weight:   Wt Readings from Last 1 Encounters:  03/02/19 50.9 kg    Ideal Body Weight:  70 kg  BMI:  Body mass index is 17.06 kg/m.  Estimated Nutritional Needs:   Kcal:  2100-2300  Protein:  95-110 grams  Fluid:  >/= 1.8 L    Cookie Pore MS, RDN, LDN, CNSC [phone] Pager  [phone] Weekend/On-Call  Pager

## 2019-03-02 NOTE — Progress Notes (Signed)
Drive line site care: Existing VAD dressing removed and site care performed using sterile technique. Drive line exit site cleaned with Chlora prep applicators x 2, allowed to dry, and gauze dressing with silver strip re-applied. Exit site with 2 sutures intact, the velour is fully implanted at exit site. Slight redness around exit site with no tenderness, drainage, foul odor or rash noted. Drive line anchor x 2 re-applied.  Pt and bedside nurse reports patient walked 2 laps this am.   Patient Education:  1. Demonstrated how to perform system controller self test. Pt performed return demonstration x 2. 2. Reviewed steps to drive line dressing and using sterile technique. Also, re-enforced need for stabilization device for DL to prevent trauma and increased chance of infection. Pt verbalized understanding of same.   [name] RN, VAD Coordinator 24/7 VAD Pager: [phone]

## 2019-03-03 ENCOUNTER — Telehealth (HOSPITAL_COMMUNITY): Payer: Self-pay | Admitting: Licensed Clinical Social Worker

## 2019-03-03 ENCOUNTER — Inpatient Hospital Stay (HOSPITAL_COMMUNITY): Payer: Medicaid - Out of State

## 2019-03-03 DIAGNOSIS — I428 Other cardiomyopathies: Secondary | ICD-10-CM

## 2019-03-03 DIAGNOSIS — Z95811 Presence of heart assist device: Secondary | ICD-10-CM

## 2019-03-03 DIAGNOSIS — I509 Heart failure, unspecified: Secondary | ICD-10-CM

## 2019-03-03 LAB — CBC
HCT: 28.1 % — ABNORMAL LOW (ref 39.0–52.0)
Hemoglobin: 9.4 g/dL — ABNORMAL LOW (ref 13.0–17.0)
MCH: 30.9 pg (ref 26.0–34.0)
MCHC: 33.5 g/dL (ref 30.0–36.0)
MCV: 92.4 fL (ref 80.0–100.0)
Platelets: 266 10*3/uL (ref 150–400)
RBC: 3.04 MIL/uL — ABNORMAL LOW (ref 4.22–5.81)
RDW: 14.8 % (ref 11.5–15.5)
WBC: 12.3 10*3/uL — ABNORMAL HIGH (ref 4.0–10.5)
nRBC: 0 % (ref 0.0–0.2)

## 2019-03-03 LAB — CBC WITH DIFFERENTIAL/PLATELET
Abs Immature Granulocytes: 0.17 10*3/uL — ABNORMAL HIGH (ref 0.00–0.07)
Basophils Absolute: 0 10*3/uL (ref 0.0–0.1)
Basophils Relative: 0 %
Eosinophils Absolute: 0.4 10*3/uL (ref 0.0–0.5)
Eosinophils Relative: 3 %
HCT: 22.6 % — ABNORMAL LOW (ref 39.0–52.0)
Hemoglobin: 7.6 g/dL — ABNORMAL LOW (ref 13.0–17.0)
Immature Granulocytes: 1 %
Lymphocytes Relative: 18 %
Lymphs Abs: 2.5 10*3/uL (ref 0.7–4.0)
MCH: 31.1 pg (ref 26.0–34.0)
MCHC: 33.6 g/dL (ref 30.0–36.0)
MCV: 92.6 fL (ref 80.0–100.0)
Monocytes Absolute: 2.2 10*3/uL — ABNORMAL HIGH (ref 0.1–1.0)
Monocytes Relative: 16 %
Neutro Abs: 8.3 10*3/uL — ABNORMAL HIGH (ref 1.7–7.7)
Neutrophils Relative %: 62 %
Platelets: 233 10*3/uL (ref 150–400)
RBC: 2.44 MIL/uL — ABNORMAL LOW (ref 4.22–5.81)
RDW: 14.7 % (ref 11.5–15.5)
WBC: 13.5 10*3/uL — ABNORMAL HIGH (ref 4.0–10.5)
nRBC: 0 % (ref 0.0–0.2)

## 2019-03-03 LAB — COMPREHENSIVE METABOLIC PANEL
ALT: 21 U/L (ref 0–44)
AST: 32 U/L (ref 15–41)
Albumin: 2.3 g/dL — ABNORMAL LOW (ref 3.5–5.0)
Alkaline Phosphatase: 66 U/L (ref 38–126)
Anion gap: 9 (ref 5–15)
BUN: 12 mg/dL (ref 6–20)
CO2: 27 mmol/L (ref 22–32)
Calcium: 8.3 mg/dL — ABNORMAL LOW (ref 8.9–10.3)
Chloride: 94 mmol/L — ABNORMAL LOW (ref 98–111)
Creatinine, Ser: 0.68 mg/dL (ref 0.61–1.24)
GFR calc Af Amer: >60 mL/min (ref >60)
GFR calc non Af Amer: >60 mL/min (ref >60)
Glucose, Bld: 123 mg/dL — ABNORMAL HIGH (ref 70–99)
Potassium: 3.4 mmol/L — ABNORMAL LOW (ref 3.5–5.1)
Sodium: 130 mmol/L — ABNORMAL LOW (ref 135–145)
Total Bilirubin: 1.1 mg/dL (ref 0.3–1.2)
Total Protein: 5.8 g/dL — ABNORMAL LOW (ref 6.5–8.1)

## 2019-03-03 LAB — COOXEMETRY PANEL
Carboxyhemoglobin: 1.4 % (ref 0.5–1.5)
Carboxyhemoglobin: 1.8 % — ABNORMAL HIGH (ref 0.5–1.5)
Methemoglobin: 0.7 % (ref 0.0–1.5)
Methemoglobin: 1 % (ref 0.0–1.5)
O2 Saturation: 48.9 %
O2 Saturation: 64.1 %
Total hemoglobin: 8.6 g/dL — ABNORMAL LOW (ref 12.0–16.0)
Total hemoglobin: 9.4 g/dL — ABNORMAL LOW (ref 12.0–16.0)

## 2019-03-03 LAB — GLUCOSE, CAPILLARY
Glucose-Capillary: 106 mg/dL — ABNORMAL HIGH (ref 70–99)
Glucose-Capillary: 116 mg/dL — ABNORMAL HIGH (ref 70–99)
Glucose-Capillary: 102 mg/dL — ABNORMAL HIGH (ref 70–99)
Glucose-Capillary: 108 mg/dL — ABNORMAL HIGH (ref 70–99)

## 2019-03-03 LAB — APTT: aPTT: 44 seconds — ABNORMAL HIGH (ref 24–36)

## 2019-03-03 LAB — LACTATE DEHYDROGENASE: LDH: 352 U/L — ABNORMAL HIGH (ref 98–192)

## 2019-03-03 LAB — PROTIME-INR
INR: 1.5 — ABNORMAL HIGH (ref 0.8–1.2)
Prothrombin Time: 17.6 seconds — ABNORMAL HIGH (ref 11.4–15.2)

## 2019-03-03 LAB — MAGNESIUM: Magnesium: 1.4 mg/dL — ABNORMAL LOW (ref 1.7–2.4)

## 2019-03-03 LAB — HEPARIN LEVEL (UNFRACTIONATED): Heparin Unfractionated: <0.1 IU/mL — ABNORMAL LOW (ref 0.30–0.70)

## 2019-03-03 LAB — PREPARE RBC (CROSSMATCH)

## 2019-03-03 LAB — PHOSPHORUS: Phosphorus: 1.4 mg/dL — ABNORMAL LOW (ref 2.5–4.6)

## 2019-03-03 MED ORDER — BOOST PLUS PO LIQD
237.0000 mL | Freq: Three times a day (TID) | ORAL | Status: DC
  Administered 2019-03-03 – 2019-03-09 (×6): 237 mL via ORAL
  Filled 2019-03-03 (×19): qty 237

## 2019-03-03 MED ORDER — MAGNESIUM SULFATE 4 GM/100ML IV SOLN
4.0000 g | Freq: Once | INTRAVENOUS | Status: AC
  Administered 2019-03-03: 4 g via INTRAVENOUS
  Filled 2019-03-03: qty 100

## 2019-03-03 MED ORDER — SODIUM PHOSPHATES 45 MMOLE/15ML IV SOLN
20.0000 mmol | Freq: Once | INTRAVENOUS | Status: AC
  Administered 2019-03-03: 20 mmol via INTRAVENOUS
  Filled 2019-03-03: qty 6.67

## 2019-03-03 MED ORDER — HEPARIN (PORCINE) 25000 UT/250ML-% IV SOLN
800.0000 [IU]/h | INTRAVENOUS | Status: DC
  Administered 2019-03-03 – 2019-03-04 (×2): 700 [IU]/h via INTRAVENOUS
  Filled 2019-03-03: qty 250

## 2019-03-03 MED ORDER — POTASSIUM CHLORIDE 10 MEQ/50ML IV SOLN
10.0000 meq | INTRAVENOUS | Status: AC
  Administered 2019-03-03 (×3): 10 meq via INTRAVENOUS
  Filled 2019-03-03 (×3): qty 50

## 2019-03-03 MED ORDER — LIDOCAINE HCL (PF) 1 % IJ SOLN
INTRAMUSCULAR | Status: AC
  Filled 2019-03-03: qty 5

## 2019-03-03 MED ORDER — SORBITOL 70 % SOLN: 30.0000 mL | Freq: Every day | Status: AC | PRN

## 2019-03-03 NOTE — Progress Notes (Signed)
Drive line site care: Existing VAD dressing removed and site care performed using sterile technique. Drive line exit site cleaned with Chlora prep applicators x 2, allowed to dry, and gauze dressing with silver strip re-applied. Exit site with 2 sutures intact, the velour is fully implanted at exit site. Slight redness around exit site with no tenderness, drainage, foul odor or rash noted. Drive line anchor x 2 re-applied.   Patient Education:  1. Delivered Patient Discharge Binder to patient's bedside. Asked him to start reading it along with his HM III Handbook.  2. Patient reports his mother and sister will be coming to visit Saturday. Plan to review VAD drive line dressing change with mother. Pt verbalized understanding of same.   [name] RN, VAD Coordinator 24/7 VAD Pager: [phone]

## 2019-03-03 NOTE — Progress Notes (Signed)
LVAD Coordinator Rounding Note:  Admitted 02/10/19 due to CHF from OSH .   HM3 LVAD implanted on 02/26/19 by PVT under DT criteria.  Pt sitting up in chair, awake, alert and talkative this am. Reports he has been getting up and walking, placing himself on batteries and back on patient cable.  He spoke with his mother yesterday and she is trying to get a ride to hospital. Told him we will plan on starting VAD education along with dressing change education with her at her next visit. He agrees to plan.   Vital signs: Temp: 98.6 HR: 106 Doppler Pressure: 74 Automatic cuff: 92/62 (70) O2 Sat: 94% on [place] Wt: 106.4>116.4>128>112>115lbs    LVAD interrogation reveals:  Speed: 5500 Flow: 3.6 Power: 4.1w PI: 5.7 Alarms: none Events:  none  Hematocrit: 23  Fixed speed: 5500 Low speed limit: 5200   Drive Line: CDI. Anchor secure. Daily dressing changes per VAD Coordinator or Nurse Alla Feeling.    Labs:  LDH trend: 243>418>431>412>373>352  INR trend: 1.3>1.9>1.5>1.2>1.5  Anticoagulation Plan: -INR Goal: 2-2.5 -ASA Dose: 325 mg until INR theapeutic  Blood Products:  -Intra op: 2 u PRBCS 6 FFP 1 PLT  Device: -N/A  Gtts: Milrinone 0.25 mcg/kg/min Heparin 00 units/hr   Respiratory: Extubated 02/27/19 03/01/19>>40% BiPAP added   03/01/19>>CT placed for right pnuemothorax 03/02/19>>O2 per [place] 03/03/19>>RA  Arrhythmia:  - 02/28/19>>Amiodarone 30 mg/hr started for atrial tachycardia  Nitric Oxide:  03/02/19>>off   VAD Education: 1. Pt reports he made power source changes this am under Brian's (BS nurse) observation. Encouraged patient to continue working with VAD equipment, he agreed to same. 2. Plan to begin VAD education, including dressing changes with mother today (if she can get a ride) or first available time she can visit with him. Pt in agreement to plan. 3. Spoke with patient about notifying his local power company and county EMS services that he has an LVAD. He uses  [employer] (in his father and mother's name: Hessie Knows and Coralee [place]) and he lives in [place]. Pt verbalized understanding of notification process and why we do so.  4. Pt uses [facility] on [address] as his Insurance claims handler.  5. Gave patient copy of HM III Patient Handbook and ask him to complete reading. Explained plan will be to complete discharge teaching with him and his mother prior to discharge home, but it is helpful if he reads the Handbook prior to this class. Pt agrees with plan.    Plan/Recommendations:  1. Page VAD coordinator w/any equipment issues or concerns or driveline management questions. 2. Dressing change per VAD Coordinator later today; waiting for mother to arrive if she can get a ride.   [name] RN, BSN VAD Coordinator 24/7 Pager [phone]

## 2019-03-03 NOTE — Progress Notes (Signed)
[place] for heparin (TCTS dosing) Indication: LVAD  No Known Allergies  Patient Measurements: Height: 5\' 8"  (172.7 cm) Weight: 115 lb 8.3 oz (52.4 kg)(with vad batteries on) IBW/kg (Calculated) : 68.4  Vital Signs: Temp: 98.6 F (37 C) (01/27 0801) Temp Source: Oral (01/27 0801) BP: 106/93 (01/27 0700) Pulse Rate: 98 (01/27 0600)  Labs: Recent Labs    03/01/19 0332 03/01/19 0644 03/02/19 0427 03/02/19 0427 03/02/19 0505 03/02/19 0505 03/02/19 0900 03/02/19 1042 03/02/19 1430 03/02/19 1512 03/03/19 0434  HGB 8.9*   < > 4.8*   < > 8.8*   < >  --  8.6*  --   --  7.6*  HCT 25.3*   < > 14.3*   < > 25.4*  --   --  25.1*  --   --  22.6*  PLT 236   < > 137*  --   --   --   --  260  --   --  233  LABPROT 18.1*  --  15.3*  --   --   --   --   --   --   --  17.6*  INR 1.5*  --  1.2  --   --   --   --   --   --   --  1.5*  HEPARINUNFRC  --    < > <0.10*   < >  --   --  <0.10*  --   --  <0.10* <0.10*  CREATININE 0.93   < > 0.95  --   --   --   --   --  0.85  --  0.68   < > = values in this interval not displayed.    Estimated Creatinine Clearance: 97.3 mL/min (by C-G formula based on SCr of 0.68 mg/dL).   Medical History: Past Medical History:  Diagnosis Date  . Dilated cardiomyopathy ([place])   . Dilated cardiomyopathy ([place]) 02/2019  . Polysubstance abuse ([facility])     Medications:  Scheduled:  . acetaminophen  1,000 mg Oral Q6H   Or  . acetaminophen (TYLENOL) oral liquid 160 mg/5 mL  1,000 mg Per Tube Q6H  . aspirin EC  325 mg Oral Daily   Or  . aspirin  324 mg Per Tube Daily  . bisacodyl  10 mg Oral Daily   Or  . bisacodyl  10 mg Rectal Daily  . Chlorhexidine Gluconate Cloth  6 each Topical Daily  . digoxin  0.125 mg Oral Daily  . docusate sodium  200 mg Oral Daily  . furosemide  20 mg Intravenous BID  . insulin aspart  0-5 Units Subcutaneous QHS  . insulin aspart  0-9 Units Subcutaneous TID WC  . mouth rinse  15 mL Mouth  Rinse BID  . pantoprazole  40 mg Oral Daily  . potassium chloride  40 mEq Oral BID  . sodium chloride flush  10 mL Intravenous Q12H  . spironolactone  12.5 mg Oral Daily  . warfarin  5 mg Oral q1800  . Warfarin - Physician Dosing Inpatient   Does not apply q1800    Assessment: 51 yom now s/p HM3 LVAD implantation on 1/22. Started on warfarin 1/24. INR today is 1.5 -  on heparin infusion at 600 units/hr. -heparin level < 0.1  No adjustments are planned unless the level is > 0.5  Hgb dropped to 7.6 today, will transfuse 1 unit PRBC.   Goal of Therapy:  Monitor platelets by anticoagulation protocol: Yes   Plan:  Continue heparin infusion at 700 units/hr per MD Monitor CBC, and for s/sx of bleeding  Lulu Riding, PharmD PGY1 Pharmacy Resident  Please check AMION for all [facility] Pharmacy phone numbers After 10:00 PM, call Main Pharmacy [phone] 03/03/2019  8:38 AM

## 2019-03-03 NOTE — Progress Notes (Signed)
Chaplain engaged in follow-up visit with Jon Martin.  He expressed being ready to be discharged and go home.  Chaplain affirmed that need and expressed joy over Jon Martin progression and choice to get his procedure done.  Jon Martin told chaplain that his mom and sister will be visiting this weekend to gain education for his healthcare at home.  Jon Martin told chaplain he felt ready and comfortable with completing his healthcare needs at home, and he expressed that his sister is a Engineer, civil (consulting).  Chaplain identifies Jon Martin readiness and comfort regarding taking care of himself outside of the hospital.   Chaplain will continue to follow-up.

## 2019-03-03 NOTE — Progress Notes (Addendum)
Patient ID: Jon Martin, male   DOB: 1986-01-06, 34 y.o.   MRN: [id number]     Advanced Heart Failure Rounding Note  PCP-Cardiologist: No primary care provider on file.   Subjective:    34 y.o. 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  Underwent HM-3 placement 02/26/2019.  Extubated 1/23. CXR 1/25 w/ new moderate size right pneumothorax s/p CT placement. Breathing improved. Stable on RA.    Remains on abx, Ceftaz, for RUL infiltrate. AF. WBC ct WNL    Milrinone weaned down to 0.25 yesterday. Co-ox 49% (down from 66%, will repeat co-ox).  MAPs in the 70s. Hgb down to 7.6  Good UOP. Additional 3.3L out yesterday. CVP 7. Feels pretty good. Out of bed and in chair. He ambulated the unit earlier this am.   LVAD Interrogation HM 3: Speed: 5550 Flow: 3.6 PI: 4.7 Power: 4.0. no PI events. VAD interrogated personally. Parameters stable.  Objective:   Weight Range: 52.4 kg Body mass index is 17.56 kg/m.   Vital Signs:   Temp:  [98 F (36.7 C)-99.2 F (37.3 C)] 98.1 F (36.7 C) (01/27 0400) Pulse Rate:  [57-116] 98 (01/27 0600) Resp:  [16-30] 26 (01/27 0700) BP: (77-106)/(57-93) 106/93 (01/27 0700) SpO2:  [89 %-100 %] 90 % (01/27 0600) Weight:  [52.4 kg] 52.4 kg (01/27 0600) Last BM Date: 02/25/19  Weight change: Filed Weights   02/28/19 0429 03/02/19 0636 03/03/19 0600  Weight: 58.1 kg 50.9 kg 52.4 kg    Intake/Output:   Intake/Output Summary (Last 24 hours) at 03/03/2019 0755 Last data filed at 03/03/2019 0700 Gross per 24 hour  Intake 384.05 ml  Output 3520 ml  Net -3135.95 ml      Physical Exam   CVP 7 General:  thin Young AAM. No respiratory difficulty  HEENT: normal anicteric  Neck: supple. JVP 7-8 RT IJ site bandaged. Carotids 2+ bilat; no bruits. No lymphadenopathy or thryomegaly appreciated. Cor: Sternal wound healing well. No drainage.  + Rt CT. + LVAD hum.  Lungs: clear no wheeze Abdomen:  soft, nontender, nondistended. No  hepatosplenomegaly. No bruits or masses. Hypoactive bowel sounds. Driveline site clean. Anchor in place.  Extremities: no cyanosis, clubbing, rash, edema Neuro: alert & oriented x 3, cranial nerves grossly intact. moves all 4 extremities w/o difficulty. Affect pleasant   Telemetry  Sinus tachycardia, low 100s-110s   Labs    CBC Recent Labs    03/02/19 0427 03/02/19 0505 03/02/19 1042 03/03/19 0434  WBC 8.4   < > 16.0* 13.5*  NEUTROABS 5.9  --   --  8.3*  HGB 4.8*   < > 8.6* 7.6*  HCT 14.3*   < > 25.1* 22.6*  MCV 91.7   < > 90.9 92.6  PLT 137*   < > 260 233   < > = values in this interval not displayed.   Basic Metabolic Panel Recent Labs    23/53/61 0427 03/02/19 0427 03/02/19 1430 03/03/19 0434  NA 134*   < > 130* 130*  K 2.6*   < > 3.5 3.4*  CL 97*   < > 93* 94*  CO2 24   < > 25 27  GLUCOSE 92   < > 124* 123*  BUN 16   < > 18 12  CREATININE 0.95   < > 0.85 0.68  CALCIUM 8.0*   < > 8.0* 8.3*  MG 1.7  --   --  1.4*  PHOS 2.9  --   --  1.4*   < > = values in this interval not displayed.   Liver Function Tests Recent Labs    03/02/19 0427 03/03/19 0434  AST 37 32  ALT 23 21  ALKPHOS 66 66  BILITOT 2.4* 1.1  PROT 5.7* 5.8*  ALBUMIN 2.4* 2.3*   No results for input(s): LIPASE, AMYLASE in the last 72 hours. Cardiac Enzymes No results for input(s): CKTOTAL, CKMB, CKMBINDEX, TROPONINI in the last 72 hours.  BNP: BNP (last 3 results) Recent Labs    02/10/19 0219 02/27/19 0433  BNP 2,048.6* 447.0*    ProBNP (last 3 results) No results for input(s): PROBNP in the last 8760 hours.   D-Dimer No results for input(s): DDIMER in the last 72 hours. Hemoglobin A1C No results for input(s): HGBA1C in the last 72 hours. Fasting Lipid Panel No results for input(s): CHOL, HDL, LDLCALC, TRIG, CHOLHDL, LDLDIRECT in the last 72 hours. Thyroid Function Tests No results for input(s): TSH, T4TOTAL, T3FREE, THYROIDAB in the last 72 hours.  Invalid input(s):  FREET3  Other results:   Imaging    No results found.   Medications:     Scheduled Medications: . acetaminophen  1,000 mg Oral Q6H   Or  . acetaminophen (TYLENOL) oral liquid 160 mg/5 mL  1,000 mg Per Tube Q6H  . aspirin EC  325 mg Oral Daily   Or  . aspirin  324 mg Per Tube Daily  . bisacodyl  10 mg Oral Daily   Or  . bisacodyl  10 mg Rectal Daily  . Chlorhexidine Gluconate Cloth  6 each Topical Daily  . digoxin  0.125 mg Oral Daily  . docusate sodium  200 mg Oral Daily  . furosemide  20 mg Intravenous BID  . insulin aspart  0-5 Units Subcutaneous QHS  . insulin aspart  0-9 Units Subcutaneous TID WC  . mouth rinse  15 mL Mouth Rinse BID  . pantoprazole  40 mg Oral Daily  . potassium chloride  40 mEq Oral BID  . sodium chloride flush  10 mL Intravenous Q12H  . spironolactone  12.5 mg Oral Daily  . warfarin  5 mg Oral q1800  . Warfarin - Physician Dosing Inpatient   Does not apply q1800    Infusions: . sodium chloride Stopped (02/28/19 0856)  . sodium chloride    . sodium chloride Stopped (03/02/19 0747)  . cefTAZidime (FORTAZ)  IV 1 g (03/03/19 0702)  . heparin 600 Units/hr (03/03/19 0700)  . magnesium sulfate bolus IVPB    . milrinone 0.25 mcg/kg/min (03/03/19 0700)  . potassium chloride 10 mEq (03/03/19 0743)    PRN Medications: sodium chloride, hydrALAZINE, ondansetron (ZOFRAN) IV, oxyCODONE, sodium chloride flush, traMADol     Assessment/Plan   1. Acute/Chronic Systolic HF -> cardiogenic shock - ECHO at [facility] EF, 20%.  - ECHO repeated and showed severely reduced EF <20%. RV only mildly down - R/L cath 1/7 normal cors. Output ok   - s/p HM-3 on 02/26/2019 - On milrinone 0.25. Co-ox 49%, in the setting of low hgb (7.6). plan transfusion x 1 unit and repeat co-ox this afternoon.   - VAD interrogated personally. Parameters stable.  - CVP improved, down to 7 today but still ~10 lb above preop wt. - continue IV lasix 20 mg bid and supplement K and  Mg  2. VAD -  s/p HM-3 on 02/26/2019 -  RV down some. Continue milrinone for RV support  -  VAD interrogated personally. Parameters stable. -  Add sildenafil as more stable - LDH 418 -> 431->412->373->352 - Warfarin started with low dose heparin bridge  - INR 1.5  3. Acute post-op respiratory failure with RUL PNA - Extubated 1/23.  - Now with RUL PNA. Ceftaz started 1/24.  - sputum cx pending - Afebrile, WBC downtrending - Encourage IS  4. Acute blood loss anemia - keep HGB > 8.0 - hgb down to 7.6 today - transfuse x 1 unit. Repeat CBC post transfusion   5. Elevated bilirubin - 0.7 -> 4.7 -> 5.5 -> 5.0 - likely combination of RV failure and underlying liver disease - hopefully will improved with diuresis and liver decongestion.   6. Moderate size right pneumothorax: new on CXR 1/25.  - s/p placement for Rt sided CT. F/u CXR showed complete resolution of apical pneumothorax.  - management per CT surgery   7. Hypokalemia: - K 3.4 today, Mg 1.4 - supp K + Mg  8. Deconditioning: - try ambulating today.    Length of Stay: [address] PA-C  03/03/2019, 7:55 AM  Advanced Heart Failure Team Pager [phone] (M-F; 7a - 4p)  Please contact [facility] for night-coverage after hours (4p -7a ) and weekends on [email or web address]  Patient seen and examined with the above-signed Advanced Practice Provider and/or Housestaff. I personally reviewed laboratory data, imaging studies and relevant notes. I independently examined the patient and formulated the important aspects of the plan. I have edited the note to reflect any of my changes or salient points. I have personally discussed the plan with the patient and/or family.  POD #5 VAD. Feeling better today. Remains on milrinone. CO-ox remains marginal. Volume status improving with IV lasix. Will supp K.   Remains on Fortaz for PNA. CT in place. Breathing better.   Bili has now normalized. INR 1.5. Hgb stable No bleeding.  Discussed dosing with PharmD personally.  Continue milrinone. Watch co-ox closely. Will need ramp echo on Friday.   Continue to mobilize.   Arvilla Meres, MD  3:36 PM

## 2019-03-03 NOTE — Progress Notes (Signed)
CSW met with patient at bedside after he completed a lap around the unit. Patient was pleased with his progress and states that he really has little to no pain. He said he spoke with his mother yesterday via phone and would like to speak with her again today. CSW and patient called Mom from bedside and he was elated to speak with her as she was to hear his voice. Mom hopes to visit one day soon but reports some transportation concerns at the moment. She will call CSW to let us know when she plans to come to [facility] for a bedside visit. CSW continues to follow for supportive intervention throughout implant hospitalization. [name], [place], [place]

## 2019-03-03 NOTE — Telephone Encounter (Signed)
CSW spoke with patient's mother via phone who states she plans to visit on Saturday. CSW stated will inform VAD Coordinator of visit and have educational information left for Mom to take home to read. CSW shared how well patient is doing and feeling improved health. Mom was thrilled with the progress. CSW continues to follow and be available for support. Lasandra Beech, LCSW, CCSW-MCS [phone]

## 2019-03-03 NOTE — Progress Notes (Signed)
CSW met with patient at bedside. Patient was sleeping but aroused and stated he was feeling well. He reports that he ambulated this morning and doing well. He reports he spoke with his sister and that his mother plans to visit this weekend. CSW provided support and will continue to follow throughout implant hospitalization. [name], [place], [place]

## 2019-03-03 NOTE — Progress Notes (Signed)
HeartMate 3 Rounding Note Heartmate 3 Implanted 02-26-2019 Subjective:    34 yo with nonischemic CM, mod RV dysfunction\and inotrope dependent Intermacs 2 cardiogenic shock POD # 5   Hx ETOH, drugs, cirrhosis HM3 implant with inotrope supported RV dysfunction Operative bronchoscopy needed for secretion induced RUL collapse- finishing 7 days Fortaz for residual infiltrate. Coagulopathy related to liver disease, HF  Ambulating well with PT  20 F right chest tube placed 03-01-19 for postop PNTX, now resolved with no air leak tube to water seal. Pocket drain 200 cc yesterday- cont suction  MAP better with diuresis, pain controlb- prn apresoline Sinus tach 108  He has been diuresing well - betterO2 sats  VATD parameters are satisfactory with very few PI events, flow now 3.8- 4 liters . Low coox this am assoc with drop in Hb 7.4 - give one unit PRBC and recheck coox  Low dose heparin started as bridge for coumadin loading, LDH 320, INR after 2 doses coumadin 1.5> 1.2 > 1.5. Heparin increased to 700 u/hr  LVAD INTERROGATION:  HeartMate III LVAD:  Flow 4.1 liters/min, speed 5500, power 4.8, PI 2.4.     Objective:    Vital Signs:   Temp:  [98 F (36.7 C)-99.2 F (37.3 C)] 98.6 F (37 C) (01/27 0801) Pulse Rate:  [59-116] 98 (01/27 0600) Resp:  [16-30] 26 (01/27 0700) BP: (77-106)/(57-93) 106/93 (01/27 0700) SpO2:  [89 %-100 %] 90 % (01/27 0600) Weight:  [52.4 kg] 52.4 kg (01/27 0600) Last BM Date: 02/25/19 Mean arterial Pressure 80-90  Intake/Output:   Intake/Output Summary (Last 24 hours) at 03/03/2019 4562 Last data filed at 03/03/2019 0700 Gross per 24 hour  Intake 324.26 ml  Output 3520 ml  Net -3195.74 ml     Physical Exam: HEENT: normal Wounds: sternal incision and DL exit clean and dry General:  Extubated, neuro intact, c/o surgical pain  ENT : JVP normal Carotids; no bruits. No lymphadenopathy or thryomegaly appreciated. Cor: Mechanical heart sounds with LVAD hum  present. Lungs: clear Abdomen: soft, nontender, nondistended. No hepatosplenomegaly. No bruits or masses. Good bowel sounds. Extremities: no cyanosis, clubbing, rash, edema Neuro: alert & orientedx3, cranial nerves grossly intact. moves all 4 extremities w/o difficulty. Affect pleasant  Telemetry: nsr rate 100  Labs: Basic Metabolic Panel: Recent Labs  Lab 02/27/19 0430 02/27/19 0441 02/27/19 1422 02/27/19 1422 02/28/19 0209 02/28/19 0426 03/01/19 0332 03/01/19 0644 03/01/19 1300 03/01/19 1300 03/02/19 0358 03/02/19 0427 03/02/19 1430 03/03/19 0434  NA 135   < > 136   < > 131*   < > 129*   < > 128*  --  132* 134* 130* 130*  K 4.4   < > 3.4*   < > 4.1   < > 3.4*   < > 3.3*  --  2.6* 2.6* 3.5 3.4*  CL 103   < > 104   < > 100   < > 92*  --  91*  --   --  97* 93* 94*  CO2 23   < > 21*   < > 18*   < > 22  --  21*  --   --  24 25 27   GLUCOSE 134*   < > 134*   < > 151*   < > 114*  --  118*  --   --  92 124* 123*  BUN 13   < > 10   < > 11   < > 13  --  16  --   --  16 18 12   CREATININE 0.76   < > 0.70   < > 0.80   < > 0.93  --  1.07  --   --  0.95 0.85 0.68  CALCIUM 8.2*   < > 8.4*   < > 8.6*   < > 8.8*   < > 8.5*   < >  --  8.0* 8.0* 8.3*  MG 2.3   < > 2.0  --  1.9  --  1.8  --   --   --   --  1.7  --  1.4*  PHOS 4.0  --   --   --  3.7  --  4.7*  --   --   --   --  2.9  --  1.4*   < > = values in this interval not displayed.    Liver Function Tests: Recent Labs  Lab 02/27/19 0430 02/28/19 0209 03/01/19 0332 03/02/19 0427 03/03/19 0434  AST 132* 86* 54* 37 32  ALT 30 27 28 23 21   ALKPHOS 47 54 91 66 66  BILITOT 4.7* 5.5* 5.0* 2.4* 1.1  PROT 5.6* 5.7* 6.1* 5.7* 5.8*  ALBUMIN 3.2* 2.8* 2.7* 2.4* 2.3*   No results for input(s): LIPASE, AMYLASE in the last 168 hours. No results for input(s): AMMONIA in the last 168 hours.  CBC: Recent Labs  Lab 02/27/19 0430 02/27/19 0441 02/28/19 0209 02/28/19 0426 03/01/19 0332 03/01/19 0644 03/01/19 1300 03/01/19 1300  03/02/19 0358 03/02/19 0427 03/02/19 0505 03/02/19 1042 03/03/19 0434  WBC 11.6*   < > 17.8*   < > 18.8*  --  18.5*  --   --  8.4  --  16.0* 13.5*  NEUTROABS 8.7*  --  14.1*  --  14.7*  --   --   --   --  5.9  --   --  8.3*  HGB 9.7*   < > 8.8*   < > 8.9*   < > 8.7*   < > 9.2* 4.8* 8.8* 8.6* 7.6*  HCT 28.1*   < > 25.7*   < > 25.3*   < > 25.0*   < > 27.0* 14.3* 25.4* 25.1* 22.6*  MCV 92.7   < > 92.8   < > 90.0  --  89.9  --   --  91.7  --  90.9 92.6  PLT 191   < > 201   < > 236  --  255  --   --  137*  --  260 233   < > = values in this interval not displayed.    INR: Recent Labs  Lab 02/27/19 0430 02/28/19 0209 03/01/19 0332 03/02/19 0427 03/03/19 0434  INR 1.3* 1.9* 1.5* 1.2 1.5*    Other results:  EKG:   Imaging: DG Chest Port 1 View  Result Date: 03/02/2019 CLINICAL DATA:  LVAD EXAM: PORTABLE CHEST 1 VIEW COMPARISON:  Radiograph 03/01/2019 FINDINGS: Stable cardiomegaly post median sternotomy left ventricular assist device placement. Right chest tube remains in place. Right upper extremity PICC terminates at the level of the right atrium. Some bandlike opacities in the lungs likely reflect areas of subsegmental atelectasis. Mild central vascular congestion is similar to prior. Suspect at least trace left pleural effusion. No right effusion or pneumothorax is as been and. Small amount of subcutaneous emphysema in the right chest wall. IMPRESSION: 1. Stable cardiomegaly post median sternotomy and left ventricular assist device placement. 2. Probable areas of subsegmental atelectasis in the lungs.  3. Suspect at least trace left pleural effusion. Electronically Signed   By: Lovena Le M.D.   On: 03/02/2019 06:21   DG Chest Port 1 View  Result Date: 03/01/2019 CLINICAL DATA:  Chest tube insertion EXAM: PORTABLE CHEST 1 VIEW COMPARISON:  03/01/2019, 7:44 a.m. FINDINGS: Interval placement of a right-sided chest tube, tip projecting about the peripheral right upper lobe. There has  been complete resolution of previously seen small right apical pneumothorax. Bandlike scarring or atelectasis of the right midlung. Previously seen left-sided chest tube has been removed. Cardiomegaly status post median sternotomy and LVAD placement. IMPRESSION: 1. Interval placement of a right-sided chest tube with complete resolution of previously seen small right apical pneumothorax. 2. Interval removal of left-sided chest tube. 3. Cardiomegaly status post median sternotomy and LVAD placement. Electronically Signed   By: Eddie Candle M.D.   On: 03/01/2019 11:48     Medications:     Scheduled Medications: . acetaminophen  1,000 mg Oral Q6H   Or  . acetaminophen (TYLENOL) oral liquid 160 mg/5 mL  1,000 mg Per Tube Q6H  . aspirin EC  325 mg Oral Daily   Or  . aspirin  324 mg Per Tube Daily  . bisacodyl  10 mg Oral Daily   Or  . bisacodyl  10 mg Rectal Daily  . Chlorhexidine Gluconate Cloth  6 each Topical Daily  . digoxin  0.125 mg Oral Daily  . docusate sodium  200 mg Oral Daily  . furosemide  20 mg Intravenous BID  . insulin aspart  0-5 Units Subcutaneous QHS  . insulin aspart  0-9 Units Subcutaneous TID WC  . mouth rinse  15 mL Mouth Rinse BID  . pantoprazole  40 mg Oral Daily  . potassium chloride  40 mEq Oral BID  . sodium chloride flush  10 mL Intravenous Q12H  . spironolactone  12.5 mg Oral Daily  . warfarin  5 mg Oral q1800  . Warfarin - Physician Dosing Inpatient   Does not apply q1800    Infusions: . sodium chloride Stopped (02/28/19 0856)  . sodium chloride    . sodium chloride Stopped (03/02/19 0747)  . cefTAZidime (FORTAZ)  IV 1 g (03/03/19 0702)  . heparin 600 Units/hr (03/03/19 0700)  . magnesium sulfate bolus IVPB    . milrinone 0.25 mcg/kg/min (03/03/19 0700)  . potassium chloride 10 mEq (03/03/19 0743)    PRN Medications: sodium chloride, hydrALAZINE, ondansetron (ZOFRAN) IV, oxyCODONE, sodium chloride flush, sorbitol, traMADol   Assessment:  Postop  coagulopathy- improved\ Expected posop blood loss anemia - 1 U PRBC VAD parameters satisfactorywith flow 4L Airway secretions/ RUL atelectasis- improved but not cleared. R pneumothorax resolved wit small chest tube Postop bilrubin 4.2 >> 5.4>> 5.0 >> 2.4,  >> 1.2 follow Postop elevated MAP- now improved  Finish 1 week [place] for poss pneumonia Plan/Discussion:     Leave pocket drain to suction, R pleural tube  H2O seal Continue milrinone for RV support Ambulate with PT Heart healthy diet DC SSI  INR  1.5- continue low-dose heparin bridge and continue oral Coumadin dosing.  I reviewed the LVAD parameters from today, and compared the results to the patient's prior recorded data.  No programming changes were made.  The LVAD is functioning within specified parameters.  The patient performs LVAD self-test daily.  LVAD interrogation was negative for any significant power changes, alarms or PI events/speed drops.  LVAD equipment check completed and is in good working order.  Back-up equipment present.  LVAD education done on emergency procedures and precautions and reviewed exit site care.  Length of Stay: [address]  Kathlee Nations Trigt III 03/03/2019, 8:11 AM

## 2019-03-03 NOTE — Progress Notes (Signed)
Physical Therapy Treatment Patient Details Name: Jon Martin MRN: [id number] DOB: [birth year] Today's Date: 03/03/2019    History of Present Illness This 34 y.o. male admitted with acute/chronic systolic HF  with cardiogenic shock.  He underwent placement of HM 3 LVAD on 02/26/2019. Post op he developed RUL PNA, as well as acute blood loss anemia. CXR 1/25 w/ new moderate size right pneumothorax s/p CT placement. PMH includes: h/o poly substance abuse, dilated cardiomyopathy    PT Comments    Patient continues to make progress toward PT goals and tolerated session well. This session focused on gait training using RW vs eva walker. Pt requires min guard/min A for gait and cues for safety. Current plan remains appropriate.    Follow Up Recommendations  Home health PT;Supervision for mobility/OOB     Equipment Recommendations  Other (comment)(TBA)    Recommendations for Other Services       Precautions / Restrictions Precautions Precautions: Sternal;Other (comment) Precaution Comments: Pt verbally instructed in sternal precautions, 2 chest tubes.    Mobility  Bed Mobility Overal bed mobility: Needs Assistance Bed Mobility: Supine to Sit;Sit to Supine     Supine to sit: Supervision;HOB elevated Sit to supine: Supervision   General bed mobility comments: Supervision for safety/lines, no use of UEs.  Transfers Overall transfer level: Needs assistance Equipment used: None Transfers: Sit to/from Stand Sit to Stand: Min guard         General transfer comment: Min guard for safety  Ambulation/Gait Ambulation/Gait assistance: Min guard;Min assist Gait Distance (Feet): 300 Feet Assistive device: Rolling walker (2 wheeled) Gait Pattern/deviations: Step-through pattern;Drifts right/left;Narrow base of support     General Gait Details: assist to steady and cues for safe use of RW; unsteady at times and little awareness of lines   Stairs             Wheelchair  Mobility    Modified Rankin (Stroke Patients Only)       Balance Overall balance assessment: Needs assistance Sitting-balance support: Feet supported;No upper extremity supported Sitting balance-Leahy Scale: Good     Standing balance support: Bilateral upper extremity supported;Single extremity supported;During functional activity Standing balance-Leahy Scale: Poor(fair to static stand)                              Cognition Arousal/Alertness: Awake/alert Behavior During Therapy: WFL for tasks assessed/performed;Impulsive Overall Cognitive Status: Within Functional Limits for tasks assessed                                 General Comments: needs cues for safety; impulsive; very pleasant but not very talkative      Exercises      General Comments General comments (skin integrity, edema, etc.): VSS       Pertinent Vitals/Pain Pain Assessment: No/denies pain    Home Living                      Prior Function            PT Goals (current goals can now be found in the care plan section) Progress towards PT goals: Progressing toward goals    Frequency    Min 3X/week      PT Plan Current plan remains appropriate    Co-evaluation              AM-PAC PT "6  Clicks" Mobility   Outcome Measure  Help needed turning from your back to your side while in a flat bed without using bedrails?: None Help needed moving from lying on your back to sitting on the side of a flat bed without using bedrails?: None Help needed moving to and from a bed to a chair (including a wheelchair)?: A Little Help needed standing up from a chair using your arms (e.g., wheelchair or bedside chair)?: A Little Help needed to walk in hospital room?: A Little Help needed climbing 3-5 steps with a railing? : A Little 6 Click Score: 20    End of Session   Activity Tolerance: Patient tolerated treatment well Patient left: in bed;with call bell/phone  within reach;with nursing/sitter in room Nurse Communication: Mobility status PT Visit Diagnosis: Pain Pain - part of body: (chest tube site)     Time: 8466-5993 PT Time Calculation (min) (ACUTE ONLY): 28 min  Charges:  $Gait Training: 23-37 mins                    Erline Levine, PTA Acute Rehabilitation Services Pager: [phone] Office: [phone]     Carolynne Edouard 03/03/2019, 4:18 PM

## 2019-03-03 NOTE — Progress Notes (Addendum)
Brief Nutrition Follow-up:  Noted Ensure Enlive discontinued by MD yesterday  Plan to re-order Boost Plus TID as previously ordered by MD, each supplement provides 360 kcals and 14 g of protein.  Recommend changing back to Ensure Enlive po TID if pt does not like/drink Boost Plus, as pt was drinking this consistently prior to LVAD surgery; each supplement provides 350 kcal and 20 grams of protein.   Pt had been drinking 3 Ensure Enlives consistently prior to surgery. All nutritional supplements contain Vitamin K. Pt needs the additional calories and protein that oral nutritional supplements contain given severe malnutrition present on admission, underweight status and post-LVAD patient.   Taken from UpToDate, "Importantly, this should not be interpreted to mean that patients should eliminate vitamin K from the diet. Rather, patients must pay attention to their dietary vitamin K intake and attempt to maintain a relatively stable level of intake over time"   The latest recommendations with regards to Vitamin K and Warfarin/other VKAs is to keep dietary Vitamin K consistent.   From UpToDate as well: "In fact poor INR control may be due to low dietary vitamin K intake or vitamin K deficiency, both of which may make the patient especially sensitive to small day-to-day variations in vitamin K intake"  Romelle Starcher MS, RDN, LDN, CNSC [phone] Pager  [phone] Weekend/On-Call Pager

## 2019-03-04 ENCOUNTER — Inpatient Hospital Stay (HOSPITAL_COMMUNITY): Payer: Medicaid - Out of State

## 2019-03-04 DIAGNOSIS — I509 Heart failure, unspecified: Secondary | ICD-10-CM

## 2019-03-04 DIAGNOSIS — Z95811 Presence of heart assist device: Secondary | ICD-10-CM

## 2019-03-04 LAB — CBC WITH DIFFERENTIAL/PLATELET
Abs Immature Granulocytes: 0.19 10*3/uL — ABNORMAL HIGH (ref 0.00–0.07)
Basophils Absolute: 0.1 10*3/uL (ref 0.0–0.1)
Basophils Relative: 1 %
Eosinophils Absolute: 0.5 10*3/uL (ref 0.0–0.5)
Eosinophils Relative: 4 %
HCT: 28.4 % — ABNORMAL LOW (ref 39.0–52.0)
Hemoglobin: 9.7 g/dL — ABNORMAL LOW (ref 13.0–17.0)
Immature Granulocytes: 1 %
Lymphocytes Relative: 16 %
Lymphs Abs: 2.5 10*3/uL (ref 0.7–4.0)
MCH: 30.7 pg (ref 26.0–34.0)
MCHC: 34.2 g/dL (ref 30.0–36.0)
MCV: 89.9 fL (ref 80.0–100.0)
Monocytes Absolute: 2.4 10*3/uL — ABNORMAL HIGH (ref 0.1–1.0)
Monocytes Relative: 16 %
Neutro Abs: 9.8 10*3/uL — ABNORMAL HIGH (ref 1.7–7.7)
Neutrophils Relative %: 62 %
Platelets: 313 10*3/uL (ref 150–400)
RBC: 3.16 MIL/uL — ABNORMAL LOW (ref 4.22–5.81)
RDW: 14.7 % (ref 11.5–15.5)
WBC: 15.5 10*3/uL — ABNORMAL HIGH (ref 4.0–10.5)
nRBC: 0.1 % (ref 0.0–0.2)

## 2019-03-04 LAB — COMPREHENSIVE METABOLIC PANEL
ALT: 23 U/L (ref 0–44)
AST: 31 U/L (ref 15–41)
Albumin: 2.3 g/dL — ABNORMAL LOW (ref 3.5–5.0)
Alkaline Phosphatase: 70 U/L (ref 38–126)
Anion gap: 9 (ref 5–15)
BUN: 12 mg/dL (ref 6–20)
CO2: 24 mmol/L (ref 22–32)
Calcium: 8.5 mg/dL — ABNORMAL LOW (ref 8.9–10.3)
Chloride: 95 mmol/L — ABNORMAL LOW (ref 98–111)
Creatinine, Ser: 0.58 mg/dL — ABNORMAL LOW (ref 0.61–1.24)
GFR calc Af Amer: >60 mL/min (ref >60)
GFR calc non Af Amer: >60 mL/min (ref >60)
Glucose, Bld: 135 mg/dL — ABNORMAL HIGH (ref 70–99)
Potassium: 4.1 mmol/L (ref 3.5–5.1)
Sodium: 128 mmol/L — ABNORMAL LOW (ref 135–145)
Total Bilirubin: 1.1 mg/dL (ref 0.3–1.2)
Total Protein: 6.1 g/dL — ABNORMAL LOW (ref 6.5–8.1)

## 2019-03-04 LAB — TYPE AND SCREEN
ABO/RH(D): O POS
Antibody Screen: NEGATIVE
Unit division: 0

## 2019-03-04 LAB — BPAM RBC
Blood Product Expiration Date: 202102282359
ISSUE DATE / TIME: 202101271152
Unit Type and Rh: 5100

## 2019-03-04 LAB — PHOSPHORUS: Phosphorus: 1.9 mg/dL — ABNORMAL LOW (ref 2.5–4.6)

## 2019-03-04 LAB — COOXEMETRY PANEL
Carboxyhemoglobin: 1.8 % — ABNORMAL HIGH (ref 0.5–1.5)
Methemoglobin: 1.1 % (ref 0.0–1.5)
O2 Saturation: 63.8 %
Total hemoglobin: 9.6 g/dL — ABNORMAL LOW (ref 12.0–16.0)

## 2019-03-04 LAB — LACTATE DEHYDROGENASE: LDH: 355 U/L — ABNORMAL HIGH (ref 98–192)

## 2019-03-04 LAB — PROTIME-INR
INR: 1.5 — ABNORMAL HIGH (ref 0.8–1.2)
Prothrombin Time: 17.8 seconds — ABNORMAL HIGH (ref 11.4–15.2)

## 2019-03-04 LAB — GLUCOSE, CAPILLARY
Glucose-Capillary: 106 mg/dL — ABNORMAL HIGH (ref 70–99)
Glucose-Capillary: 123 mg/dL — ABNORMAL HIGH (ref 70–99)
Glucose-Capillary: 121 mg/dL — ABNORMAL HIGH (ref 70–99)

## 2019-03-04 LAB — MAGNESIUM: Magnesium: 1.8 mg/dL (ref 1.7–2.4)

## 2019-03-04 LAB — HEPARIN LEVEL (UNFRACTIONATED): Heparin Unfractionated: <0.1 IU/mL — ABNORMAL LOW (ref 0.30–0.70)

## 2019-03-04 MED ORDER — SODIUM PHOSPHATES 45 MMOLE/15ML IV SOLN
15.0000 mmol | Freq: Once | INTRAVENOUS | Status: AC
  Administered 2019-03-04: 15 mmol via INTRAVENOUS
  Filled 2019-03-04: qty 5

## 2019-03-04 MED ORDER — GUAIFENESIN ER 600 MG PO TB12
600.0000 mg | ORAL_TABLET | Freq: Two times a day (BID) | ORAL | Status: DC
  Administered 2019-03-04 – 2019-03-15 (×23): 600 mg via ORAL
  Filled 2019-03-04 (×23): qty 1

## 2019-03-04 MED ORDER — HYDROCOD POLST-CPM POLST ER 10-8 MG/5ML PO SUER
5.0000 mL | Freq: Two times a day (BID) | ORAL | Status: DC
  Administered 2019-03-04 – 2019-03-15 (×23): 5 mL via ORAL
  Filled 2019-03-04 (×23): qty 5

## 2019-03-04 MED ORDER — WARFARIN SODIUM 7.5 MG PO TABS
7.5000 mg | ORAL_TABLET | Freq: Every day | ORAL | Status: DC
  Administered 2019-03-04: 7.5 mg via ORAL
  Filled 2019-03-04: qty 1

## 2019-03-04 MED ORDER — GENERIC EXTERNAL MEDICATION
Status: DC
Start: ? — End: 2019-03-04

## 2019-03-04 MED ORDER — MAGNESIUM SULFATE 2 GM/50ML IV SOLN
2.0000 g | Freq: Once | INTRAVENOUS | Status: AC
  Administered 2019-03-04: 2 g via INTRAVENOUS
  Filled 2019-03-04: qty 50

## 2019-03-04 MED ORDER — SILDENAFIL CITRATE 20 MG PO TABS
20.0000 mg | ORAL_TABLET | Freq: Three times a day (TID) | ORAL | Status: DC
  Administered 2019-03-04 – 2019-03-15 (×33): 20 mg via ORAL
  Filled 2019-03-04 (×33): qty 1

## 2019-03-04 MED ORDER — POTASSIUM CHLORIDE CRYS ER 20 MEQ PO TBCR
40.0000 meq | EXTENDED_RELEASE_TABLET | Freq: Once | ORAL | Status: AC
  Administered 2019-03-04: 40 meq via ORAL
  Filled 2019-03-04: qty 2

## 2019-03-04 MED ORDER — FUROSEMIDE 10 MG/ML IJ SOLN
80.0000 mg | Freq: Once | INTRAMUSCULAR | Status: AC
  Administered 2019-03-04: 80 mg via INTRAVENOUS
  Filled 2019-03-04: qty 8

## 2019-03-04 MED ORDER — SODIUM CHLORIDE 0.9 % IV SOLN
2.0000 g | Freq: Three times a day (TID) | INTRAVENOUS | Status: DC
  Administered 2019-03-04 – 2019-03-05 (×3): 2 g via INTRAVENOUS
  Filled 2019-03-04 (×5): qty 2

## 2019-03-04 NOTE — Progress Notes (Signed)
ANTICOAGULATION CONSULT NOTE   Pharmacy Consult for heparin (TCTS dosing) Indication: LVAD  No Known Allergies  Patient Measurements: Height: 5\' 8"  (172.7 cm) Weight: 115 lb 8.3 oz (52.4 kg)(with vad batteries on) IBW/kg (Calculated) : 68.4  Vital Signs: Temp: 97.8 F (36.6 C) (01/28 0407) Temp Source: Oral (01/28 0407) BP: 118/97 (01/28 0500) Pulse Rate: 95 (01/28 0500)  Labs: Recent Labs    03/02/19 0427 03/02/19 0505 03/02/19 0900 03/02/19 1042 03/02/19 1430 03/02/19 1512 03/03/19 0434 03/03/19 0434 03/03/19 1547 03/04/19 0312  HGB 4.8*   < >  --    < >  --   --  7.6*   < > 9.4* 9.7*  HCT 14.3*   < >  --    < >  --   --  22.6*  --  28.1* 28.4*  PLT 137*   < >  --    < >  --   --  233  --  266 313  APTT  --   --   --   --   --   --   --   --  44*  --   LABPROT 15.3*  --   --   --   --   --  17.6*  --   --  17.8*  INR 1.2  --   --   --   --   --  1.5*  --   --  1.5*  HEPARINUNFRC <0.10*   < >   < >  --   --  <0.10* <0.10*  --   --  <0.10*  CREATININE 0.95   < >  --   --  0.85  --  0.68  --   --  0.58*   < > = values in this interval not displayed.    Estimated Creatinine Clearance: 97.3 mL/min (A) (by C-G formula based on SCr of 0.58 mg/dL (L)).   Medical History: Past Medical History:  Diagnosis Date  . Dilated cardiomyopathy (HCC)   . Dilated cardiomyopathy (HCC) 02/2019  . Polysubstance abuse (HCC)     Medications:  Scheduled:  . aspirin EC  325 mg Oral Daily   Or  . aspirin  324 mg Per Tube Daily  . bisacodyl  10 mg Oral Daily   Or  . bisacodyl  10 mg Rectal Daily  . Chlorhexidine Gluconate Cloth  6 each Topical Daily  . digoxin  0.125 mg Oral Daily  . docusate sodium  200 mg Oral Daily  . furosemide  20 mg Intravenous BID  . lactose free nutrition  237 mL Oral TID WC  . mouth rinse  15 mL Mouth Rinse BID  . pantoprazole  40 mg Oral Daily  . potassium chloride  40 mEq Oral BID  . sodium chloride flush  10 mL Intravenous Q12H  .  spironolactone  12.5 mg Oral Daily  . warfarin  5 mg Oral q1800  . Warfarin - Physician Dosing Inpatient   Does not apply q1800    Assessment: 33 yom now s/p HM3 LVAD implantation on 1/22. Started on warfarin 1/24. INR today is 1.5 -  on heparin infusion at 700 units/hr. -heparin level < 0.1. Hgb improved to 9.7 s/p 1 unit PRBC yesterday.  No adjustments are planned unless the level is > 0.5  Goal of Therapy:  Monitor platelets by anticoagulation protocol: Yes   Plan:  Continue heparin infusion at 700 units/hr per MD Monitor CBC, and for s/sx  of bleeding  Berenice Bouton, PharmD PGY1 Pharmacy Resident  Please check AMION for all [place] phone numbers After 10:00 PM, call [place] [phone] 03/04/2019  6:48 AM

## 2019-03-04 NOTE — Discharge Instructions (Addendum)

## 2019-03-04 NOTE — TOC Progression Note (Signed)
Transition of Care [facility]) - Progression Note    Patient Details  Name: Jon Martin MRN: [id number] Date of Birth: [birth year]  Transition of Care [facility]) CM/SW Contact  Graves-Bigelow, Lamar Laundry, RN Phone Number: 03/04/2019, 4:14 PM  Clinical Narrative:   Patient post LVAD- ambulating with physical therapy. Case Manager will continue to follow for transition of care needs.   Expected Discharge Plan: Home w Home Health Services Barriers to Discharge: (Plan for LVAD Friday)  Expected Discharge Plan and Services Expected Discharge Plan: Home w Home Health Services In-house Referral: NA Discharge Planning Services: CM Consult Post Acute Care Choice: Home Health, Durable Medical Equipment Living arrangements for the past 2 months: Single Family Home                  Readmission Risk Interventions No flowsheet data found.

## 2019-03-04 NOTE — Progress Notes (Signed)
Occupational Therapy Treatment Patient Details Name: Jon Martin MRN: [id number] DOB: Apr 01, 1985 Today's Date: 03/04/2019    History of present illness This 34 y.o. male admitted with acute/chronic systolic HF  with cardiogenic shock.  He underwent placement of HM 3 LVAD on 02/26/2019. Post op he developed RUL PNA, as well as acute blood loss anemia. CXR 1/25 w/ new moderate size right pneumothorax s/p CT placement. PMH includes: h/o poly substance abuse, dilated cardiomyopathy   OT comments  Pt educated on items needed for black bag, names of LVAD parts and changing battery source. Pt able to don LVAD vest with min assist and donned his socks independently. Ambulated unit x 2 with min guard assist and no device with stable VS and no c/o fatigue or pain. Chest tubes are now out. Remained in chair eating sherbet. Pt quizzed on what to do in the event of a power outtage.   Follow Up Recommendations  No OT follow up;Supervision/Assistance - 24 hour    Equipment Recommendations  None recommended by OT    Recommendations for Other Services      Precautions / Restrictions Precautions Precautions: Sternal       Mobility Bed Mobility Overal bed mobility: Modified Independent                Transfers   Equipment used: None Transfers: Sit to/from Stand Sit to Stand: Min guard         General transfer comment: stands without UE use    Balance Overall balance assessment: Needs assistance   Sitting balance-Leahy Scale: Good       Standing balance-Leahy Scale: Good                             ADL either performed or assessed with clinical judgement   ADL Overall ADL's : Needs assistance/impaired                 Upper Body Dressing : Minimal assistance;Sitting Upper Body Dressing Details (indicate cue type and reason): LVAD vest Lower Body Dressing: Set up;Sitting/lateral leans Lower Body Dressing Details (indicate cue type and reason): socks              Functional mobility during ADLs: Min guard(walked 2 laps with no device) General ADL Comments: Verbal cues for items in black bag, names of LVAD parts and changing power source.     Vision       Perception     Praxis      Cognition Arousal/Alertness: Awake/alert Behavior During Therapy: WFL for tasks assessed/performed;Impulsive Overall Cognitive Status: Within Functional Limits for tasks assessed                                          Exercises     Shoulder Instructions       General Comments      Pertinent Vitals/ Pain       Pain Assessment: Faces Faces Pain Scale: No hurt  Home Living                                          Prior Functioning/Environment              Frequency  Min 2X/week  Progress Toward Goals  OT Goals(current goals can now be found in the care plan section)  Progress towards OT goals: Progressing toward goals  Acute Rehab OT Goals Patient Stated Goal: to get better  OT Goal Formulation: With patient Time For Goal Achievement: 03/14/19 Potential to Achieve Goals: Good  Plan Discharge plan remains appropriate    Co-evaluation                 AM-PAC OT "6 Clicks" Daily Activity     Outcome Measure   Help from another person eating meals?: None Help from another person taking care of personal grooming?: A Little Help from another person toileting, which includes using toliet, bedpan, or urinal?: A Little Help from another person bathing (including washing, rinsing, drying)?: A Little Help from another person to put on and taking off regular upper body clothing?: A Little Help from another person to put on and taking off regular lower body clothing?: None 6 Click Score: 20    End of Session    OT Visit Diagnosis: Unsteadiness on feet (R26.81);Pain   Activity Tolerance Patient tolerated treatment well   Patient Left in chair;with call bell/phone within  reach   Nurse Communication Mobility status        Time: 1430-1503 OT Time Calculation (min): 33 min  Charges: OT General Charges $OT Visit: 1 Visit OT Treatments $Self Care/Home Management : 23-37 mins  [name], OTR/L Acute Rehabilitation Services Pager: [phone] Office: [phone]  [name] 03/04/2019, 3:03 PM

## 2019-03-04 NOTE — Progress Notes (Signed)
Pharmacy Antibiotic Note  Jon Martin is a 34 y.o. male admitted on 02/09/2019 s/p LVAD.  Post ob abx broadened to cefepime for pna / cough WBC elev 15 Cr stable 0.6 Pharmacy has been consulted for cefepime dosing.  Plan: Cefepime 2gm IV q8h  Height: 5\' 8"  (172.7 cm) Weight: 115 lb 8.3 oz (52.4 kg)(with vad batteries on) IBW/kg (Calculated) : 68.4  Temp (24hrs), Avg:98.4 F (36.9 C), Min:97.8 F (36.6 C), Max:99.3 F (37.4 C)  Recent Labs  Lab 03/01/19 1300 03/01/19 1300 03/02/19 0427 03/02/19 1042 03/02/19 1430 03/03/19 0434 03/03/19 1547 03/04/19 0312  WBC 18.5*   < > 8.4 16.0*  --  13.5* 12.3* 15.5*  CREATININE 1.07  --  0.95  --  0.85 0.68  --  0.58*   < > = values in this interval not displayed.    Estimated Creatinine Clearance: 97.3 mL/min (A) (by C-G formula based on SCr of 0.58 mg/dL (L)).   03/06/19 Pharm.D. CPP, BCPS Clinical Pharmacist [phone] 03/04/2019 1:31 PM

## 2019-03-04 NOTE — Progress Notes (Signed)
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 Coagulopathy related to liver disease, HF  Ambulating well with PT Wet cough today with slight ibcreased WBC- IV lasix ordered and antibiotic escaalated to  Cefipime. CXR without pntx- both tubes removed  MAP better with diuresis, pain control- prn apresoline Sinus tach 108 off amiodarone  He has been diuresing well - betterO2 sats  VATD parameters are satisfactory with very few PI events, flow now 3.8- 4 liters .  coox this am satisfactory   Low dose heparin started as bridge for coumadin loading, LDH 340, INR after 2 doses coumadin 1.5> 1.2 > 1.5.>1.5 Heparin increased to 800 u/hr, coumadin to 7.5 mg  LVAD INTERROGATION:  HeartMate III LVAD:  Flow 4.0 liters/min, speed 5500, power 4.8, PI 2.4.     Objective:    Vital Signs:   Temp:  [97.8 F (36.6 C)-99.3 F (37.4 C)] 99.3 F (37.4 C) (01/28 0900) Pulse Rate:  [31-119] 118 (01/28 1200) Resp:  [19-34] 21 (01/28 1200) BP: (95-126)/(69-101) 116/88 (01/28 1200) SpO2:  [82 %-98 %] 94 % (01/28 1200) Last BM Date: 03/02/19 Mean arterial Pressure 80-90  Intake/Output:   Intake/Output Summary (Last 24 hours) at 03/04/2019 1233 Last data filed at 03/04/2019 1200 Gross per 24 hour  Intake 1460.73 ml  Output 4250 ml  Net -2789.27 ml     Physical Exam: HEENT: normal Wounds: sternal incision and DL exit clean and dry General:  Extubated, neuro intact, c/o surgical pain  ENT : JVP normal Carotids; no bruits. No lymphadenopathy or thryomegaly appreciated. Cor: Mechanical heart sounds with LVAD hum present. Lungs: clear Abdomen: soft, nontender,  nondistended. No hepatosplenomegaly. No bruits or masses. Good bowel sounds. Extremities: no cyanosis, clubbing, rash, edema Neuro: alert & orientedx3, cranial nerves grossly intact. moves all 4 extremities w/o difficulty. Affect pleasant  Telemetry: nsr rate 100  Labs: Basic Metabolic Panel: Recent Labs  Lab 02/28/19 0209 02/28/19 0426 03/01/19 0332 03/01/19 0644 03/01/19 1300 03/01/19 1300 03/02/19 0358 03/02/19 0427 03/02/19 0427 03/02/19 1430 03/03/19 0434 03/04/19 0312  NA 131*   < > 129*   < > 128*   < > 132* 134*  --  130* 130* 128*  K 4.1   < > 3.4*   < > 3.3*   < > 2.6* 2.6*  --  3.5 3.4* 4.1  CL 100   < > 92*   < > 91*  --   --  97*  --  93* 94* 95*  CO2 18*   < > 22   < > 21*  --   --  24  --  25 27 24   GLUCOSE 151*   < > 114*   < > 118*  --   --  92  --  124* 123* 135*  BUN 11   < > 13   < > 16  --   --  16  --  18 12 12   CREATININE 0.80   < > 0.93   < > 1.07  --   --  0.95  --  0.85 0.68 0.58*  CALCIUM 8.6*   < > 8.8*   < > 8.5*   < >  --  8.0*   < > 8.0* 8.3* 8.5*  MG 1.9  --  1.8  --   --   --   --  1.7  --   --  1.4* 1.8  PHOS 3.7  --  4.7*  --   --   --   --  2.9  --   --  1.4* 1.9*   < > = values in this interval not displayed.    Liver Function Tests: Recent Labs  Lab 02/28/19 0209 03/01/19 0332 03/02/19 0427 03/03/19 0434 03/04/19 0312  AST 86* 54* 37 32 31  ALT 27 28 23 21 23   ALKPHOS 54 91 66 66 70  BILITOT 5.5* 5.0* 2.4* 1.1 1.1  PROT 5.7* 6.1* 5.7* 5.8* 6.1*  ALBUMIN 2.8* 2.7* 2.4* 2.3* 2.3*   No results for input(s): LIPASE, AMYLASE in the last 168 hours. No results for input(s): AMMONIA in the last 168 hours.  CBC: Recent Labs  Lab 02/28/19 0209 02/28/19 0426 03/01/19 0332 03/01/19 0644 03/02/19 0427 03/02/19 0427 03/02/19 0505 03/02/19 1042 03/03/19 0434 03/03/19 1547 03/04/19 0312  WBC 17.8*   < > 18.8*   < > 8.4  --   --  16.0* 13.5* 12.3* 15.5*  NEUTROABS 14.1*  --  14.7*  --  5.9  --   --   --  8.3*  --  9.8*  HGB  8.8*   < > 8.9*   < > 4.8*   < > 8.8* 8.6* 7.6* 9.4* 9.7*  HCT 25.7*   < > 25.3*   < > 14.3*   < > 25.4* 25.1* 22.6* 28.1* 28.4*  MCV 92.8   < > 90.0   < > 91.7  --   --  90.9 92.6 92.4 89.9  PLT 201   < > 236   < > 137*  --   --  260 233 266 313   < > = values in this interval not displayed.    INR: Recent Labs  Lab 02/28/19 0209 03/01/19 0332 03/02/19 0427 03/03/19 0434 03/04/19 0312  INR 1.9* 1.5* 1.2 1.5* 1.5*    Other results:  EKG:   Imaging: DG Chest Port 1 View  Result Date: 03/04/2019 CLINICAL DATA:  LVAD EXAM: PORTABLE CHEST 1 VIEW COMPARISON:  Radiograph 03/03/2019 FINDINGS: Right upper extremity PICC tip terminates at the superior cavoatrial junction. A right apically directed chest tube is in stable position from prior. There are post sternotomy changes of the chest wall with a left ventricular assist device in place. Abandoned epicardial pacer wires are noted. Stable central vascular congestion and cardiomegaly when compared to prior studies. No pneumothorax. Bandlike areas of opacity throughout both lungs are compatible with atelectasis. More dense opacity in the retrocardiac space and left lung base could reflect atelectasis or layering pleural fluid. No other acute osseous or soft tissue abnormality. IMPRESSION: 1. Stable support lines and tubes. 2. Persistent mild vascular congestion and cardiomegaly. 3. Bandlike areas of subsegmental atelectasis with more dense opacity in the retrocardiac space, likely combination of atelectasis and layering pleural fluid. Electronically Signed   By: 03/05/2019 M.D.   On: 03/04/2019 05:53   DG Chest Port 1 View  Result Date: 03/03/2019 CLINICAL DATA:  Cardiopulmonary status. EXAM: PORTABLE CHEST 1 VIEW COMPARISON:  03/02/2019 FINDINGS: Prior median sternotomy. LVAD unchanged. Right-sided PICC line with tip at the cavoatrial junction unchanged. Right-sided chest tube unchanged. Stable hazy perihilar opacification likely mild vascular  congestion. Stable hazy opacification in the  left retrocardiac region likely combination of effusion with atelectasis. No pneumothorax. Stable cardiomegaly. Remainder of the exam is unchanged. IMPRESSION: 1. Stable left base/retrocardiac opacification likely combination of effusion with atelectasis. 2.  Stable cardiomegaly with stable mild vascular congestion. 3.  Tubes and lines unchanged. Electronically Signed   By: Elberta Fortis M.D.   On: 03/03/2019 08:54     Medications:     Scheduled Medications: . aspirin EC  325 mg Oral Daily   Or  . aspirin  324 mg Per Tube Daily  . bisacodyl  10 mg Oral Daily   Or  . bisacodyl  10 mg Rectal Daily  . Chlorhexidine Gluconate Cloth  6 each Topical Daily  . chlorpheniramine-HYDROcodone  5 mL Oral Q12H  . digoxin  0.125 mg Oral Daily  . docusate sodium  200 mg Oral Daily  . guaiFENesin  600 mg Oral BID  . lactose free nutrition  237 mL Oral TID WC  . mouth rinse  15 mL Mouth Rinse BID  . pantoprazole  40 mg Oral Daily  . potassium chloride  40 mEq Oral BID  . potassium chloride  40 mEq Oral Once  . sodium chloride flush  10 mL Intravenous Q12H  . spironolactone  12.5 mg Oral Daily  . warfarin  7.5 mg Oral q1800  . Warfarin - Physician Dosing Inpatient   Does not apply q1800    Infusions: . ceFEPime (MAXIPIME) IV 200 mL/hr at 03/04/19 1200  . heparin 800 Units/hr (03/04/19 1200)  . milrinone 0.125 mcg/kg/min (03/04/19 1200)  . sodium phosphate  Dextrose 5% IVPB 43 mL/hr at 03/04/19 1200    PRN Medications: hydrALAZINE, ondansetron (ZOFRAN) IV, oxyCODONE, sodium chloride flush, sorbitol, traMADol   Assessment:  Postop coagulopathy- resolved Expected posop blood loss anemia - 1 U PRBC 1-27 VAD parameters satisfactorywith flow 4L Airway secretions/ RUL atelectasis- improved complete 1 week of iv antibiotics  Postop bilrubin 4.2 >> 5.4>> 5.0 >> 2.4,  >> 1.2 follow Postop elevated MAP- now improved   Plan/Discussion:    DC pocket  drain annd R PT today Continue milrinone for RV support Ambulate with PT Heart healthy diet   INR  1.5- continue low-dose heparin bridge and continue oral Coumadin dosing.  I reviewed the LVAD parameters from today, and compared the results to the patient's prior recorded data.  No programming changes were made.  The LVAD is functioning within specified parameters.  The patient performs LVAD self-test daily.  LVAD interrogation was negative for any significant power changes, alarms or PI events/speed drops.  LVAD equipment check completed and is in good working order.  Back-up equipment present.   LVAD education done on emergency procedures and precautions and reviewed exit site care.  Length of Stay: [address]  Kathlee Nations [employer] 03/04/2019, 12:33 PM

## 2019-03-04 NOTE — Progress Notes (Signed)
LVAD Coordinator Rounding Note:  Admitted 02/10/19 due to CHF from OSH .   HM3 LVAD implanted on 02/26/19 by PVT under DT criteria.  Pt sitting up in bed, BS nurse preparing to dc CT's. Pt awake, alert, reported pain earlier, but "better" now.   Mother and sister Sharion Balloon) coming Saturday for first post op visit. Will ask her to complete reading "HM III Patient Handbook". Extra copy left at bedside. Patient aware of plan and agrees to same.  Pt has been up and walking multiple laps multiple times per day. He has a cough this morning with has been addressed by MD. Nurse gave Lasix 80 mg IV and patient voided x 3 times while I was in room.  Possible ramp echo tomorrow if pt stable per Dr. Gala Romney.    Vital signs: Temp: 99.3 HR: 115 Doppler Pressure: 103 Automatic cuff: 95/77 (82) O2 Sat: 92% on RA Wt: 106.4>116.4>128>112>115lbs    LVAD interrogation reveals:  Speed: 5500 Flow: 3.7 Power: 4.1w PI: 6.0 Alarms: none Events:  none  Hematocrit: 28  Fixed speed: 5500 Low speed limit: 5200   Drive Line: CDI. Anchor secure. Daily dressing changes per VAD Coordinator or Nurse Alla Feeling. Existing VAD dressing removed and site care performed using sterile technique. Drive line exit site cleaned with Chlora prep applicators x 2, allowed to dry, andgauze dressing withsilver stripre-applied. Exit site with 2 sutures intact,the velour is fully implanted at exit site.Slight redness around exit site with notenderness, drainage, foul odor or rash noted. Drive line anchor x 2 re-applied.   Labs:  LDH trend: 243>418>431>412>373>352>355  INR trend: 1.3>1.9>1.5>1.2>1.5>1.5  Anticoagulation Plan: -INR Goal: 2-2.5 -ASA Dose: 325 mg until INR theapeutic  Blood Products:  -Intra op: 2 u PRBCS 6 FFP 1 PLT  -Post-op: - 03/03/19>>1 unit PCs  Device: -N/A  Gtts: Milrinone 0.25 mcg/kg/min Heparin 800 units/hr   Respiratory: Extubated 02/27/19 03/01/19>>40% BiPAP added     03/01/19>>CT placed for right pnuemothorax 03/02/19>>O2 per [place] 03/03/19>>RA  Arrhythmia:  - 02/28/19>>Amiodarone 30 mg/hr started for atrial tachycardia  Nitric Oxide:  03/02/19>>off   VAD Education: 1. Reviewed steps of dressing change with patient, reviewed picture of exit site with him. Explained importance of doing dressing changes under sterile/aseptic conditions to prevent risk of infections. 2. Explained how to secure Drive line and importance of same in order to reduce risk of infection. 3. Explained necessity of eating high protein diet (meats, beans, greek yogurt, etc) in order to help with healing process after surgery.  4. Brought an example of concealed weapon T-shirt for patient to see if he thought he might try, he agreed to same. 5. Pt has not started reading "HM III Patient Handbook", but will "try" and start today.  6. Discharge teaching to continue with pt and his mother at her visit tomorrow. Pt verbalized understanding and agreement to all the above.     Plan/Recommendations:  1. Page VAD coordinator w/any equipment issues or concerns or driveline management questions. 2. Dressing change per VAD Coordinator later today; waiting for mother to arrive if she can get a ride.   [name] RN, BSN VAD Coordinator 24/7 Pager [phone]

## 2019-03-04 NOTE — Progress Notes (Addendum)
Patient ID: Christain Niznik, male   DOB: 1985-07-23, 34 y.o.   MRN: [id number]     Advanced Heart Failure Rounding Note  PCP-Cardiologist: No primary care provider on file.   Subjective:    34 y.o. 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  Underwent HM-3 placement 02/26/2019.  Extubated 1/23. CXR 1/25 w/ new moderate size right pneumothorax s/p CT placement. Breathing improved. Stable on RA.    Remains on abx, Ceftaz, for RUL infiltrate. Day 5/7. AF but WBC ct up from 12>>15.    Milrinone weaned down to 0.25 on 1/26. Co-ox 64%.   Hgb improved after transfusion, received 1 unit PRBCs on 1/27. Hgb 7.6>>9.4>>9.7.   Good UOP w/ IV Lasix. Additional 3.0L out yesterday but CVP up to 18 and with new nonproductive cough that developed overnight. CXR today w/ mild vascular congestion.    LVAD Interrogation HM 3: Speed: 5550 Flow: 3.7 PI: 46 Power: 4.2. 3 PI events this AM (likely 2/2 cough). VAD interrogated personally. Parameters stable.  Objective:   Weight Range: 52.4 kg Body mass index is 17.56 kg/m.   Vital Signs:   Temp:  [97.8 F (36.6 C)-98.8 F (37.1 C)] 97.8 F (36.6 C) (01/28 0407) Pulse Rate:  [31-117] 117 (01/28 0700) Resp:  [18-34] 24 (01/28 0700) BP: (92-125)/(62-101) 110/94 (01/28 0600) SpO2:  [82 %-98 %] 91 % (01/28 0700) Last BM Date: 03/02/19  Weight change: Filed Weights   02/28/19 0429 03/02/19 0636 03/03/19 0600  Weight: 58.1 kg 50.9 kg 52.4 kg    Intake/Output:   Intake/Output Summary (Last 24 hours) at 03/04/2019 0736 Last data filed at 03/04/2019 0700 Gross per 24 hour  Intake 1219.36 ml  Output 3000 ml  Net -1780.64 ml      Physical Exam   CVP 16 General: fatigue appearing AAM. No respiratory difficulty but frequent coughing HEENT: normal anicteric  Neck: supple. Elevated JVP to ear Carotids 2+ bilat; no bruits. No lymphadenopathy or thryomegaly appreciated. Cor: Sternal wound healing well. No drainage.  + Rt  CT. + LVAD hum.  Lungs: clear no wheeze Abdomen:  soft, nontender, nondistended. No hepatosplenomegaly. No bruits or masses. Hypoactive bowel sounds. Driveline site clean. Anchor in place.  Extremities: no cyanosis, clubbing, rash, edema Neuro: alert & oriented x 3, cranial nerves grossly intact. moves all 4 extremities w/o difficulty. Affect pleasant   Telemetry  Sinus tachycardia, low 100s-110s   Labs    CBC Recent Labs    03/03/19 0434 03/03/19 0434 03/03/19 1547 03/04/19 0312  WBC 13.5*   < > 12.3* 15.5*  NEUTROABS 8.3*  --   --  9.8*  HGB 7.6*   < > 9.4* 9.7*  HCT 22.6*   < > 28.1* 28.4*  MCV 92.6   < > 92.4 89.9  PLT 233   < > 266 313   < > = values in this interval not displayed.   Basic Metabolic Panel Recent Labs    03/03/19 0434 03/04/19 0312  NA 130* 128*  K 3.4* 4.1  CL 94* 95*  CO2 27 24  GLUCOSE 123* 135*  BUN 12 12  CREATININE 0.68 0.58*  CALCIUM 8.3* 8.5*  MG 1.4* 1.8  PHOS 1.4* 1.9*   Liver Function Tests Recent Labs    03/03/19 0434 03/04/19 0312  AST 32 31  ALT 21 23  ALKPHOS 66 70  BILITOT 1.1 1.1  PROT 5.8* 6.1*  ALBUMIN 2.3* 2.3*   No results for input(s):  LIPASE, AMYLASE in the last 72 hours. Cardiac Enzymes No results for input(s): CKTOTAL, CKMB, CKMBINDEX, TROPONINI in the last 72 hours.  BNP: BNP (last 3 results) Recent Labs    02/10/19 0219 02/27/19 0433  BNP 2,048.6* 447.0*    ProBNP (last 3 results) No results for input(s): PROBNP in the last 8760 hours.   D-Dimer No results for input(s): DDIMER in the last 72 hours. Hemoglobin A1C No results for input(s): HGBA1C in the last 72 hours. Fasting Lipid Panel No results for input(s): CHOL, HDL, LDLCALC, TRIG, CHOLHDL, LDLDIRECT in the last 72 hours. Thyroid Function Tests No results for input(s): TSH, T4TOTAL, T3FREE, THYROIDAB in the last 72 hours.  Invalid input(s): FREET3  Other results:   Imaging    DG Chest Port 1 View  Result Date:  03/04/2019 CLINICAL DATA:  LVAD EXAM: PORTABLE CHEST 1 VIEW COMPARISON:  Radiograph 03/03/2019 FINDINGS: Right upper extremity PICC tip terminates at the superior cavoatrial junction. A right apically directed chest tube is in stable position from prior. There are post sternotomy changes of the chest wall with a left ventricular assist device in place. Abandoned epicardial pacer wires are noted. Stable central vascular congestion and cardiomegaly when compared to prior studies. No pneumothorax. Bandlike areas of opacity throughout both lungs are compatible with atelectasis. More dense opacity in the retrocardiac space and left lung base could reflect atelectasis or layering pleural fluid. No other acute osseous or soft tissue abnormality. IMPRESSION: 1. Stable support lines and tubes. 2. Persistent mild vascular congestion and cardiomegaly. 3. Bandlike areas of subsegmental atelectasis with more dense opacity in the retrocardiac space, likely combination of atelectasis and layering pleural fluid. Electronically Signed   By: Kreg Shropshire M.D.   On: 03/04/2019 05:53     Medications:     Scheduled Medications: . aspirin EC  325 mg Oral Daily   Or  . aspirin  324 mg Per Tube Daily  . bisacodyl  10 mg Oral Daily   Or  . bisacodyl  10 mg Rectal Daily  . Chlorhexidine Gluconate Cloth  6 each Topical Daily  . digoxin  0.125 mg Oral Daily  . docusate sodium  200 mg Oral Daily  . furosemide  20 mg Intravenous BID  . lactose free nutrition  237 mL Oral TID WC  . mouth rinse  15 mL Mouth Rinse BID  . pantoprazole  40 mg Oral Daily  . potassium chloride  40 mEq Oral BID  . sodium chloride flush  10 mL Intravenous Q12H  . spironolactone  12.5 mg Oral Daily  . warfarin  5 mg Oral q1800  . Warfarin - Physician Dosing Inpatient   Does not apply q1800    Infusions: . cefTAZidime (FORTAZ)  IV 1 g (03/04/19 0555)  . heparin 700 Units/hr (03/04/19 0700)  . milrinone 0.25 mcg/kg/min (03/04/19 0700)     PRN Medications: hydrALAZINE, ondansetron (ZOFRAN) IV, oxyCODONE, sodium chloride flush, sorbitol, traMADol     Assessment/Plan   1. Acute/Chronic Systolic HF -> cardiogenic shock - ECHO at [facility] EF, 20%.  - ECHO repeated and showed severely reduced EF <20%. RV only mildly down - R/L cath 1/7 normal cors. Output ok   - s/p HM-3 on 02/26/2019 - On milrinone 0.25. Co-ox 64%. - Volume up. CVP 16 and symptomatic w/ worsening cough. Will give 80 IV Lasix now. Monitor CVP closely. Keep milrinone at 0.25 today for RV support - Plan Ramp Echo tomorrow if more stable  - VAD interrogated personally.  Parameters stable.   2. VAD -  s/p HM-3 on 02/26/2019 -  RV down some. Continue milrinone for RV support  -  VAD interrogated personally. Parameters stable. -  Add sildenafil as more stable - LDH 418 -> 431->412->373->352->355 - Warfarin started with low dose heparin bridge  - INR 1.5  3. Acute post-op respiratory failure with RUL PNA - Extubated 1/23.  - CXR 1/24 w/ RUL PNA. Ceftaz started, Day 5/7 - sputum cx pending - WBC ct up slightly from 12>>15 but Afebrile. Repeat CXR 1/28 w/ no signs of progression, but with worsening cough and rising WBC ct, will broaden abx coverage. Stop Ceftaz and start cefepime.    - Encourage IS   4. Acute blood loss anemia - keep HGB > 8.0 - s/p transfusion x 1 unit 1/27 for hgb of 7.6.  - improved today at 9.7   5. Elevated bilirubin - 0.7 -> 4.7 -> 5.5 -> 5.0 -> -> 1.1 - likely combination of RV failure and underlying liver disease - hopefully will improved with diuresis and liver decongestion.   6. Moderate size right pneumothorax: new on CXR 1/25.  - s/p placement for Rt sided CT. F/u CXR showed complete resolution of apical pneumothorax.  - management per CT surgery   7. Hypokalemia/ Hypomangesemia: - Keep K >4.0 and Mg > 2.0  - K 4.1 today. Mg 1.8 (will give 2 gm IV suppl)  8. Deconditioning: - continue to ambulate w/ PT.    Length  of Stay: [address] PA-C  03/04/2019, 7:36 AM  Advanced Heart Failure Team Pager [phone] (M-F; 7a - 4p)  Please contact [facility] for night-coverage after hours (4p -7a ) and weekends on [email or web address]   Patient seen and examined with the above-signed Advanced Practice Provider and/or Housestaff. I personally reviewed laboratory data, imaging studies and relevant notes. I independently examined the patient and formulated the important aspects of the plan. I have edited the note to reflect any of my changes or salient points. I have personally discussed the plan with the patient and/or family.  Feels a bit better today but has hacking cough. Tm 99.3. WBC climbing. Co-ox 64% on milrinone 0.25. CVP 9-10 (checked personally).   Chest tube out today. TCTS expanded abx ceftaz -> cefimpime  INR 1.5 LDH 355  General:  NAD. + cough HEENT: normal  Neck: supple. JVP 9/10.  Carotids 2+ bilat; no bruits. No lymphadenopathy or thryomegaly appreciated. Cor: LVAD hum.  Lungs: Clear. Decreased at bases Abdomen: soft, nontender, non-distended. No hepatosplenomegaly. No bruits or masses. Good bowel sounds. Driveline site clean. Anchor in place.  Extremities: no cyanosis, clubbing, rash. Warm no edema  Neuro: alert & oriented x 3. No focal deficits. Moves all 4 without problem   Making progress but still tenuous. Agree with expanding abx. Give one dose IV lasix. Wean milrinone to 0.125. Watch co-ox. Ambulate. VAD interrogated personally. Parameters stable. INR 1.5 on low-dose heparin/warfarin. Discussed dosing with PharmD personally.  Ramp echo Monday. Not ready for 2C yet.   Arvilla Meres, MD  10:53 AM

## 2019-03-05 ENCOUNTER — Inpatient Hospital Stay (HOSPITAL_COMMUNITY): Payer: Medicaid - Out of State

## 2019-03-05 DIAGNOSIS — I509 Heart failure, unspecified: Secondary | ICD-10-CM

## 2019-03-05 LAB — PROTIME-INR
INR: 2 — ABNORMAL HIGH (ref 0.8–1.2)
Prothrombin Time: 22.2 seconds — ABNORMAL HIGH (ref 11.4–15.2)

## 2019-03-05 LAB — HEPARIN LEVEL (UNFRACTIONATED): Heparin Unfractionated: <0.1 IU/mL — ABNORMAL LOW (ref 0.30–0.70)

## 2019-03-05 LAB — CBC WITH DIFFERENTIAL/PLATELET
Abs Immature Granulocytes: 0.36 10*3/uL — ABNORMAL HIGH (ref 0.00–0.07)
Basophils Absolute: 0.1 10*3/uL (ref 0.0–0.1)
Basophils Relative: 1 %
Eosinophils Absolute: 0.7 10*3/uL — ABNORMAL HIGH (ref 0.0–0.5)
Eosinophils Relative: 3 %
HCT: 28.5 % — ABNORMAL LOW (ref 39.0–52.0)
Hemoglobin: 9.5 g/dL — ABNORMAL LOW (ref 13.0–17.0)
Immature Granulocytes: 2 %
Lymphocytes Relative: 13 %
Lymphs Abs: 2.6 10*3/uL (ref 0.7–4.0)
MCH: 30.5 pg (ref 26.0–34.0)
MCHC: 33.3 g/dL (ref 30.0–36.0)
MCV: 91.6 fL (ref 80.0–100.0)
Monocytes Absolute: 2.6 10*3/uL — ABNORMAL HIGH (ref 0.1–1.0)
Monocytes Relative: 13 %
Neutro Abs: 14.3 10*3/uL — ABNORMAL HIGH (ref 1.7–7.7)
Neutrophils Relative %: 68 %
Platelets: 381 10*3/uL (ref 150–400)
RBC: 3.11 MIL/uL — ABNORMAL LOW (ref 4.22–5.81)
RDW: 14.8 % (ref 11.5–15.5)
WBC: 20.6 10*3/uL — ABNORMAL HIGH (ref 4.0–10.5)
nRBC: 0 % (ref 0.0–0.2)

## 2019-03-05 LAB — COMPREHENSIVE METABOLIC PANEL
ALT: 23 U/L (ref 0–44)
AST: 33 U/L (ref 15–41)
Albumin: 2.4 g/dL — ABNORMAL LOW (ref 3.5–5.0)
Alkaline Phosphatase: 65 U/L (ref 38–126)
Anion gap: 7 (ref 5–15)
BUN: 8 mg/dL (ref 6–20)
CO2: 23 mmol/L (ref 22–32)
Calcium: 8.4 mg/dL — ABNORMAL LOW (ref 8.9–10.3)
Chloride: 100 mmol/L (ref 98–111)
Creatinine, Ser: 0.52 mg/dL — ABNORMAL LOW (ref 0.61–1.24)
GFR calc Af Amer: >60 mL/min (ref >60)
GFR calc non Af Amer: >60 mL/min (ref >60)
Glucose, Bld: 121 mg/dL — ABNORMAL HIGH (ref 70–99)
Potassium: 4.7 mmol/L (ref 3.5–5.1)
Sodium: 130 mmol/L — ABNORMAL LOW (ref 135–145)
Total Bilirubin: 1 mg/dL (ref 0.3–1.2)
Total Protein: 6.3 g/dL — ABNORMAL LOW (ref 6.5–8.1)

## 2019-03-05 LAB — LACTATE DEHYDROGENASE: LDH: 379 U/L — ABNORMAL HIGH (ref 98–192)

## 2019-03-05 LAB — COOXEMETRY PANEL
Carboxyhemoglobin: 1.3 % (ref 0.5–1.5)
Carboxyhemoglobin: 1.5 % (ref 0.5–1.5)
Methemoglobin: 0.7 % (ref 0.0–1.5)
Methemoglobin: 0.6 % (ref 0.0–1.5)
O2 Saturation: 55.1 %
O2 Saturation: 62.6 %
Total hemoglobin: 11.6 g/dL — ABNORMAL LOW (ref 12.0–16.0)
Total hemoglobin: 9.5 g/dL — ABNORMAL LOW (ref 12.0–16.0)

## 2019-03-05 LAB — GLUCOSE, CAPILLARY
Glucose-Capillary: 115 mg/dL — ABNORMAL HIGH (ref 70–99)
Glucose-Capillary: 132 mg/dL — ABNORMAL HIGH (ref 70–99)
Glucose-Capillary: 120 mg/dL — ABNORMAL HIGH (ref 70–99)

## 2019-03-05 LAB — MAGNESIUM: Magnesium: 1.9 mg/dL (ref 1.7–2.4)

## 2019-03-05 LAB — BRAIN NATRIURETIC PEPTIDE: B Natriuretic Peptide: 427.2 pg/mL — ABNORMAL HIGH (ref 0.0–100.0)

## 2019-03-05 LAB — PHOSPHORUS: Phosphorus: 2.2 mg/dL — ABNORMAL LOW (ref 2.5–4.6)

## 2019-03-05 MED ORDER — SODIUM CHLORIDE 0.9 % IV SOLN
1.0000 g | Freq: Three times a day (TID) | INTRAVENOUS | Status: AC
  Administered 2019-03-05 – 2019-03-10 (×17): 1 g via INTRAVENOUS
  Filled 2019-03-05 (×19): qty 1

## 2019-03-05 MED ORDER — SODIUM CHLORIDE 0.9 % IV SOLN
INTRAVENOUS | Status: DC | PRN
  Administered 2019-03-05: 250 mL via INTRAVENOUS

## 2019-03-05 MED ORDER — WARFARIN SODIUM 2 MG PO TABS
4.0000 mg | ORAL_TABLET | Freq: Every day | ORAL | Status: DC
  Administered 2019-03-05: 4 mg via ORAL
  Filled 2019-03-05: qty 2

## 2019-03-05 MED ORDER — MAGNESIUM SULFATE 2 GM/50ML IV SOLN
2.0000 g | Freq: Once | INTRAVENOUS | Status: AC
  Administered 2019-03-05: 2 g via INTRAVENOUS
  Filled 2019-03-05: qty 50

## 2019-03-05 MED ORDER — WARFARIN SODIUM 5 MG PO TABS: 5.0000 mg | ORAL_TABLET | Freq: Every day | ORAL | Status: DC

## 2019-03-05 MED ORDER — SODIUM PHOSPHATES 45 MMOLE/15ML IV SOLN
10.0000 mmol | Freq: Once | INTRAVENOUS | Status: AC
  Administered 2019-03-05: 10 mmol via INTRAVENOUS
  Filled 2019-03-05: qty 3.33

## 2019-03-05 MED ORDER — FUROSEMIDE 10 MG/ML IJ SOLN
80.0000 mg | Freq: Once | INTRAMUSCULAR | Status: DC
  Filled 2019-03-05: qty 8

## 2019-03-05 MED ORDER — ASPIRIN EC 81 MG PO TBEC
81.0000 mg | DELAYED_RELEASE_TABLET | Freq: Every day | ORAL | Status: DC
  Administered 2019-03-05 – 2019-03-15 (×11): 81 mg via ORAL
  Filled 2019-03-05 (×11): qty 1

## 2019-03-05 NOTE — Plan of Care (Signed)
  Problem: Clinical Measurements: Goal: Respiratory complications will improve Outcome: Progressing   Problem: Nutrition: Goal: Adequate nutrition will be maintained Outcome: Progressing   Problem: Pain Managment: Goal: General experience of comfort will improve Outcome: Progressing    

## 2019-03-05 NOTE — Progress Notes (Signed)
HeartMate 3 Rounding Note Heartmate 3 Implanted 02-26-2019 Subjective:    34 yo with nonischemic  cardiomyopathy, mod RV dysfunction and inotrope dependent Intermacs 2 cardiogenic shock preop POD # 7   Hx ETOH, drugs, cirrhosis HM3 implant with inotrope supported RV dysfunction Operative bronchoscopy needed for secretion induced RUL collapse- finishing  Fortaz/Cefixime for residual infiltrate and postop rising WBC Coagulopathy related to liver disease, HFwith expected perio transfusion requirement  Ambulating well with PT Wet cough improved today  with  increased WBC- 20 k  Continue  Cefipime. CXR without pntx- both tubes removed  MAP better with diuresis, pain control- prn apresoline Sinus tach 108 off amiodarone  He has been diuresing well - betterO2 sats Wt down to 49 kg  VATD parameters are satisfactory with very few PI events, flow now 3.8- 4 liters .  coox this am 55% on .125 milrinone- will repeat   Low dose heparin started as bridge for coumadin loading, LDH 340, INR after 2 doses coumadin 1.5> 1.2 > 1.5.>1.5 > 2.0 Heparin stopped and coumadin ordered 4mg  this pm. Will get EPWs out probably Monday  LVAD INTERROGATION:  HeartMate III LVAD:  Flow 4.0 liters/min, speed 5500, power 4.8, PI 3.4.     Objective:    Vital Signs:   Temp:  [98.2 F (36.8 C)-99.5 F (37.5 C)] 98.8 F (37.1 C) (01/29 0759) Pulse Rate:  [33-152] 115 (01/29 0900) Resp:  [17-36] 24 (01/29 0900) BP: (88-142)/(58-116) 96/81 (01/29 0900) SpO2:  [71 %-100 %] 91 % (01/29 0900) Weight:  [49.1 kg] 49.1 kg (01/29 0900) Last BM Date: 03/04/19 Mean arterial Pressure 80-90  Intake/Output:   Intake/Output Summary (Last 24 hours) at 03/05/2019 0948 Last data filed at 03/05/2019 0900 Gross per 24 hour  Intake 1527.55 ml  Output 3526 ml  Net -1998.45 ml     Physical Exam: HEENT: normal Wounds: sternal incision and DL exit clean and dry General:  Extubated, neuro intact, c/o surgical pain  ENT : JVP  normal Carotids; no bruits. No lymphadenopathy or thryomegaly appreciated. Cor: Mechanical heart sounds with LVAD hum present. Lungs: clear Abdomen: soft, nontender, nondistended. No hepatosplenomegaly. No bruits or masses. Good bowel sounds. Extremities: no cyanosis, clubbing, rash, edema Neuro: alert & orientedx3, cranial nerves grossly intact. moves all 4 extremities w/o difficulty. Affect pleasant  Telemetry: nsr rate 100  Labs: Basic Metabolic Panel: Recent Labs  Lab 03/01/19 0332 03/01/19 0644 03/02/19 0427 03/02/19 0427 03/02/19 1430 03/02/19 1430 03/03/19 0434 03/04/19 0312 03/05/19 0343  NA 129*   < > 134*  --  130*  --  130* 128* 130*  K 3.4*   < > 2.6*  --  3.5  --  3.4* 4.1 4.7  CL 92*   < > 97*  --  93*  --  94* 95* 100  CO2 22   < > 24  --  25  --  27 24 23   GLUCOSE 114*   < > 92  --  124*  --  123* 135* 121*  BUN 13   < > 16  --  18  --  12 12 8   CREATININE 0.93   < > 0.95  --  0.85  --  0.68 0.58* 0.52*  CALCIUM 8.8*   < > 8.0*   < > 8.0*   < > 8.3* 8.5* 8.4*  MG 1.8  --  1.7  --   --   --  1.4* 1.8 1.9  PHOS 4.7*  --  2.9  --   --   --  1.4* 1.9* 2.2*   < > = values in this interval not displayed.    Liver Function Tests: Recent Labs  Lab 03/01/19 0332 03/02/19 0427 03/03/19 0434 03/04/19 0312 03/05/19 0343  AST 54* 37 32 31 33  ALT 28 23 21 23 23   ALKPHOS 91 66 66 70 65  BILITOT 5.0* 2.4* 1.1 1.1 1.0  PROT 6.1* 5.7* 5.8* 6.1* 6.3*  ALBUMIN 2.7* 2.4* 2.3* 2.3* 2.4*   No results for input(s): LIPASE, AMYLASE in the last 168 hours. No results for input(s): AMMONIA in the last 168 hours.  CBC: Recent Labs  Lab 03/01/19 0332 03/01/19 0644 03/02/19 0427 03/02/19 0505 03/02/19 1042 03/03/19 0434 03/03/19 1547 03/04/19 0312 03/05/19 0343  WBC 18.8*   < > 8.4   < > 16.0* 13.5* 12.3* 15.5* 20.6*  NEUTROABS 14.7*  --  5.9  --   --  8.3*  --  9.8* 14.3*  HGB 8.9*   < > 4.8*   < > 8.6* 7.6* 9.4* 9.7* 9.5*  HCT 25.3*   < > 14.3*   < > 25.1*  22.6* 28.1* 28.4* 28.5*  MCV 90.0   < > 91.7   < > 90.9 92.6 92.4 89.9 91.6  PLT 236   < > 137*   < > 260 233 266 313 381   < > = values in this interval not displayed.    INR: Recent Labs  Lab 03/01/19 0332 03/02/19 0427 03/03/19 0434 03/04/19 0312 03/05/19 0343  INR 1.5* 1.2 1.5* 1.5* 2.0*    Other results:  EKG:   Imaging: DG Chest Port 1 View  Result Date: 03/05/2019 CLINICAL DATA:  Left ventricular assist device.  Cough. EXAM: PORTABLE CHEST 1 VIEW COMPARISON:  02/24/2019. FINDINGS: Interim removal of right chest tube. Right PICC line stable position. Left ventricular assist device in stable position. Prior median sternotomy. Stable cardiomegaly. Persistent prominent left base atelectasis/consolidation and left-sided pleural effusion. New infiltrate/edema left upper lung. Persistent right mid lung field and right base subsegmental atelectasis. Small right pleural effusion. No pneumothorax. IMPRESSION: 1. Interim removal of right chest tube. No pneumothorax. Right PICC line and stable position. 2. Prior median sternotomy. Left ventricular assist device in stable position. Stable cardiomegaly. No pulmonary venous congestion. 3. Persistent prominent left base atelectasis/consolidation and left-sided pleural effusion. New infiltrate/edema left upper lung. 4. Persistent right mid lung field and right base subsegmental atelectasis. Small right pleural effusion. Electronically Signed   By: Marcello Moores  Register   On: 03/05/2019 06:04   DG Chest Port 1 View  Result Date: 03/04/2019 CLINICAL DATA:  LVAD EXAM: PORTABLE CHEST 1 VIEW COMPARISON:  Radiograph 03/03/2019 FINDINGS: Right upper extremity PICC tip terminates at the superior cavoatrial junction. A right apically directed chest tube is in stable position from prior. There are post sternotomy changes of the chest wall with a left ventricular assist device in place. Abandoned epicardial pacer wires are noted. Stable central vascular congestion  and cardiomegaly when compared to prior studies. No pneumothorax. Bandlike areas of opacity throughout both lungs are compatible with atelectasis. More dense opacity in the retrocardiac space and left lung base could reflect atelectasis or layering pleural fluid. No other acute osseous or soft tissue abnormality. IMPRESSION: 1. Stable support lines and tubes. 2. Persistent mild vascular congestion and cardiomegaly. 3. Bandlike areas of subsegmental atelectasis with more dense opacity in the retrocardiac space, likely combination of atelectasis and layering pleural fluid. Electronically Signed   By: Lovena Le M.D.   On:  03/04/2019 05:53     Medications:     Scheduled Medications: . aspirin EC  81 mg Oral Daily  . bisacodyl  10 mg Oral Daily   Or  . bisacodyl  10 mg Rectal Daily  . Chlorhexidine Gluconate Cloth  6 each Topical Daily  . chlorpheniramine-HYDROcodone  5 mL Oral Q12H  . digoxin  0.125 mg Oral Daily  . docusate sodium  200 mg Oral Daily  . guaiFENesin  600 mg Oral BID  . lactose free nutrition  237 mL Oral TID WC  . mouth rinse  15 mL Mouth Rinse BID  . pantoprazole  40 mg Oral Daily  . potassium chloride  40 mEq Oral BID  . sildenafil  20 mg Oral TID  . sodium chloride flush  10 mL Intravenous Q12H  . spironolactone  12.5 mg Oral Daily  . warfarin  5 mg Oral q1800  . Warfarin - Physician Dosing Inpatient   Does not apply q1800    Infusions: . magnesium sulfate bolus IVPB 2 g (03/05/19 0936)  . meropenem (MERREM) IV    . milrinone 0.125 mcg/kg/min (03/05/19 0900)  . sodium phosphate  Dextrose 5% IVPB      PRN Medications: hydrALAZINE, ondansetron (ZOFRAN) IV, oxyCODONE, sodium chloride flush, sorbitol, traMADol   Assessment:  Postop coagulopathy- resolved Expected posop blood loss anemia - 1 U PRBC 1-27 VAD parameters satisfactorywith flow 3.8 L Airway secretions/ RUL atelectasis- improved but with cough and rising WBC will continue iv antibiotics  Sternal  incision, DL incision clean, dry Postop bilrubin 4.2 >> 5.4>> 5.0 >> 2.4,  >> 1.2 follow Postop elevated MAP- now improved   Plan/Discussion:     Continue milrinone for RV support at .125, repeat coox Ambulate with PT Heart healthy diet   INR   2.0 DC low-dose heparin bridge and continue oral Coumadin dosing. ASA to 81 mg  I reviewed the LVAD parameters from today, and compared the results to the patient's prior recorded data.  No programming changes were made.  The LVAD is functioning within specified parameters.  The patient performs LVAD self-test daily.  LVAD interrogation was negative for any significant power changes, alarms or PI events/speed drops.  LVAD equipment check completed and is in good working order.  Back-up equipment present.   LVAD education done on emergency procedures and precautions and reviewed exit site care.  Length of Stay: 24  Kathlee Nations Trigt III 03/05/2019, 9:48 AM

## 2019-03-05 NOTE — Progress Notes (Signed)
Pharmacy Antibiotic Note  Jon Martin is a 34 y.o. male admitted on 02/09/2019 s/p LVAD. Post-op abx was broaded to ceftaz for possible pna. Yesterday coverage broadened to cefepime d/t increasing WBC and increased cough. Pharmacy has been consulted for meropenem dosing.  Pt is afebrile, WBC up to 20.6. SCr stable at 0.52.   Plan: Meropenem 1 gm IV q8h Monitor clinical improvement, renal function, and abx narrowing when appropriate  Height: 5\' 8"  (172.7 cm) Weight: 108 lb 3.9 oz (49.1 kg)(without VAD batteries) IBW/kg (Calculated) : 68.4  Temp (24hrs), Avg:98.7 F (37.1 C), Min:98.2 F (36.8 C), Max:99.5 F (37.5 C)  Recent Labs  Lab 03/02/19 0427 03/02/19 0427 03/02/19 1042 03/02/19 1430 03/03/19 0434 03/03/19 1547 03/04/19 0312 03/05/19 0343  WBC 8.4   < > 16.0*  --  13.5* 12.3* 15.5* 20.6*  CREATININE 0.95  --   --  0.85 0.68  --  0.58* 0.52*   < > = values in this interval not displayed.    Estimated Creatinine Clearance: 91.2 mL/min (A) (by C-G formula based on SCr of 0.52 mg/dL (L)).     03/07/19, PharmD PGY1 Pharmacy Resident  Please check AMION for all [facility] phone numbers After 10:00 PM, call Main Pharmacy [phone] 03/05/2019 9:57 AM

## 2019-03-05 NOTE — Progress Notes (Signed)
LVAD Coordinator Rounding Note:  Admitted 02/10/19 due to CHF from OSH .   HM3 LVAD implanted on 02/26/19 by PVT under DT criteria.  Pt sitting up in bed after walking in the hall with PT. States he does not have any pain right now.   Per bedside RN patient is "wild" when he sleeps and thinks at times he may accidentally tug his driveline. See dressing change note below. Repositioned anchors to ensure driveline securely held in place.   Mother and sister Sharion Balloon) coming Saturday for first post op visit. Will ask her to complete reading "HM III Patient Handbook". Extra copy left at bedside. Patient encouraged to read through discharge education binder this weekend.    Vital signs: Temp: 98.8 HR: 121 Doppler Pressure: not done Automatic cuff:102/72 (83) O2 Sat: 42% on RA Wt: 106.4>116.4>128>112>115>115.5 lbs    LVAD interrogation reveals:  Speed: 5500 Flow: 4.0 Power: 4.1w PI: 4.0 Alarms: none Events:  3 PI events Hematocrit: 29  Fixed speed: 5500 Low speed limit: 5200   Drive Line: CDI. Anchor secure x 2. Daily dressing changes per VAD Coordinator or Nurse Alla Feeling. Existing VAD dressing removed and site care performed using sterile technique. Drive line exit site cleaned with Chlora prep applicators x 2, allowed to dry, andgauze dressing withsilver stripre-applied. Exit site with 2 sutures intact,the velour is fully implanted at exit site. Small amount of bloody drainage at exit site and on gauze.Slight redness around exit site with notenderness, foul odor or rash noted. Drive line anchor x 2 re-applied.      Labs:  LDH trend: 243>418>431>412>373>352>355>379  INR trend: 1.3>1.9>1.5>1.2>1.5>1.5>2.0  Anticoagulation Plan: -INR Goal: 2-2.5 -ASA Dose: 325 mg until INR theapeutic  Blood Products:  -Intra op: 2 u PRBCS 6 FFP 1 PLT  -Post-op: - 03/03/19>>1 unit PCs  Device: -N/A  Gtts: Milrinone 0.125 mcg/kg/min Heparin 800  units/hr>>off   Respiratory: Extubated 02/27/19 03/01/19>>40% BiPAP added   03/01/19>>CT placed for right pnuemothorax 03/02/19>>O2 per [place] 03/03/19>>RA  Arrhythmia:  - 02/28/19>>Amiodarone 30 mg/hr started for atrial tachycardia  Nitric Oxide:  03/02/19>>off   VAD Education: 1. Reviewed steps of dressing change with patient, reviewed picture of exit site with him. Explained importance of doing dressing changes under sterile/aseptic conditions to prevent risk of infections. 2. Explained importance of and how to secure drive line in order to reduce risk of infection or trauma. Discussed with bedside RN importance of two anchors at all times.  3. Pt demonstrated knowledge of batteries, clips, and power source change. He verbalized steps for daily self test. Reinforced importance of making sure power source connection complete before screwing into place.  4. Explained how to check VAD numbers using controller.  5. Pt has not started reading "HM III Patient Handbook" or discharge education binder, but will "try" and start today. Discussed with bedside RN need to reinforce reading of binder.  6. Discharge teaching to continue with pt and his mother at her visit Saturday. Pt verbalized understanding and agreement to all the above.    Plan/Recommendations:  1. Page VAD coordinator w/any equipment issues or concerns or driveline management questions. 2. Daily dressing change per VAD coordinator or nurse champion.   Alyce Pagan RN VAD Coordinator  Office: [phone]  24/7 Pager: [phone]

## 2019-03-05 NOTE — Progress Notes (Signed)
CSW met with patient at bedside to review the SDOH wheel. Patient identifed alcohol as a concern on the wheel. Patient admits to daily alcohol intake of 40oz beer. He states that he reduces his drinking prior to admission due to health. CSW and patient discussed at length the importance of avoiding alcohol during recovery and the risks of drinking with an LVAD. Patient reports good support from his mother and does not anticipate she will allow any alcohol in the home. Patient appears open to continued discussions and verbalizes understanding of avoiding alcohol.  CSW and patient will continue conversations around alcohol and determine if resource referrals are needed post hospitalization. Patient scored green in the other areas of the SDOH questions. CSW continues to follow for supportive intervention throughout hospitalization. [name], [place], [place]

## 2019-03-05 NOTE — Progress Notes (Addendum)
Patient ID: Jon Martin, male   DOB: 1985/12/04, 34 y.o.   MRN: [id number]     Advanced Heart Failure Rounding Note  PCP-Cardiologist: No primary care provider on file.   Subjective:    34 y.o. 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  Underwent HM-3 placement 02/26/2019.  Extubated 1/23. CXR 1/25 w/ new moderate size right pneumothorax s/p CT placement. CT removed /28    On cefepime, for RUL infiltrate. AF but WBC ct up from 12>>15>>20.    Milrinone weaned down to 0.25 on 1/26. Co-ox 55%.   Hgb improved after transfusion, received 1 unit PRBCs on 1/27. Hgb 7.6>>9.4>>9.7>>9.5.   Yesterday he received IV lasix. Brisk diuresis noted. CVP 9-10 .   Complaining of fatigue. Denies SOB.   LVAD Interrogation HM 3: Speed: 5550 Flow 3.9  PI: 3.8 Power: 4.1. Rare PI events. VAD interrogated personally. Parameters stable.  Objective:   Weight Range: 52.4 kg Body mass index is 17.56 kg/m.   Vital Signs:   Temp:  [98.2 F (36.8 C)-99.5 F (37.5 C)] 98.8 F (37.1 C) (01/29 0759) Pulse Rate:  [33-152] 123 (01/29 0700) Resp:  [17-36] 22 (01/29 0700) BP: (88-142)/(58-116) 118/89 (01/29 0700) SpO2:  [71 %-100 %] 100 % (01/29 0700) Last BM Date: 03/04/19  Weight change: Filed Weights   02/28/19 0429 03/02/19 0636 03/03/19 0600  Weight: 58.1 kg 50.9 kg 52.4 kg    Intake/Output:   Intake/Output Summary (Last 24 hours) at 03/05/2019 0832 Last data filed at 03/05/2019 0700 Gross per 24 hour  Intake 1514.74 ml  Output 4176 ml  Net -2661.26 ml      Physical Exam  CVP 9-10 Physical Exam: GENERAL: No acute distress. HEENT: normal  NECK: Supple, JVP 8-9 .  2+ bilaterally, no bruits.  No lymphadenopathy or thyromegaly appreciated.   CARDIAC:  Mechanical heart sounds with LVAD hum present.  LUNGS:  Clear to auscultation bilaterally.  ABDOMEN:  Soft, round, nontender, positive bowel sounds x4.     LVAD exit site: Dressing dry and intact.  No erythema  or drainage.  Stabilization device present and accurately applied.  EXTREMITIES:  Warm and dry, no cyanosis, clubbing, rash or edema . RUE PICC NEUROLOGIC:  Alert and oriented x 4.  No aphasia.  No dysarthria.  Affect pleasant.       Telemetry  Sinus Tach 110-120s   Labs    CBC Recent Labs    03/04/19 0312 03/05/19 0343  WBC 15.5* 20.6*  NEUTROABS 9.8* 14.3*  HGB 9.7* 9.5*  HCT 28.4* 28.5*  MCV 89.9 91.6  PLT 313 381   Basic Metabolic Panel Recent Labs    41/96/22 0312 03/05/19 0343  NA 128* 130*  K 4.1 4.7  CL 95* 100  CO2 24 23  GLUCOSE 135* 121*  BUN 12 8  CREATININE 0.58* 0.52*  CALCIUM 8.5* 8.4*  MG 1.8 1.9  PHOS 1.9* 2.2*   Liver Function Tests Recent Labs    03/04/19 0312 03/05/19 0343  AST 31 33  ALT 23 23  ALKPHOS 70 65  BILITOT 1.1 1.0  PROT 6.1* 6.3*  ALBUMIN 2.3* 2.4*   No results for input(s): LIPASE, AMYLASE in the last 72 hours. Cardiac Enzymes No results for input(s): CKTOTAL, CKMB, CKMBINDEX, TROPONINI in the last 72 hours.  BNP: BNP (last 3 results) Recent Labs    02/10/19 0219 02/27/19 0433 03/05/19 0056  BNP 2,048.6* 447.0* 427.2*    ProBNP (last 3 results) No  results for input(s): PROBNP in the last 8760 hours.   D-Dimer No results for input(s): DDIMER in the last 72 hours. Hemoglobin A1C No results for input(s): HGBA1C in the last 72 hours. Fasting Lipid Panel No results for input(s): CHOL, HDL, LDLCALC, TRIG, CHOLHDL, LDLDIRECT in the last 72 hours. Thyroid Function Tests No results for input(s): TSH, T4TOTAL, T3FREE, THYROIDAB in the last 72 hours.  Invalid input(s): FREET3  Other results:   Imaging    DG Chest Port 1 View  Result Date: 03/05/2019 CLINICAL DATA:  Left ventricular assist device.  Cough. EXAM: PORTABLE CHEST 1 VIEW COMPARISON:  02/24/2019. FINDINGS: Interim removal of right chest tube. Right PICC line stable position. Left ventricular assist device in stable position. Prior median  sternotomy. Stable cardiomegaly. Persistent prominent left base atelectasis/consolidation and left-sided pleural effusion. New infiltrate/edema left upper lung. Persistent right mid lung field and right base subsegmental atelectasis. Small right pleural effusion. No pneumothorax. IMPRESSION: 1. Interim removal of right chest tube. No pneumothorax. Right PICC line and stable position. 2. Prior median sternotomy. Left ventricular assist device in stable position. Stable cardiomegaly. No pulmonary venous congestion. 3. Persistent prominent left base atelectasis/consolidation and left-sided pleural effusion. New infiltrate/edema left upper lung. 4. Persistent right mid lung field and right base subsegmental atelectasis. Small right pleural effusion. Electronically Signed   By: Maisie Fus  Register   On: 03/05/2019 06:04     Medications:     Scheduled Medications: . aspirin EC  81 mg Oral Daily  . bisacodyl  10 mg Oral Daily   Or  . bisacodyl  10 mg Rectal Daily  . Chlorhexidine Gluconate Cloth  6 each Topical Daily  . chlorpheniramine-HYDROcodone  5 mL Oral Q12H  . digoxin  0.125 mg Oral Daily  . docusate sodium  200 mg Oral Daily  . furosemide  80 mg Intravenous Once  . guaiFENesin  600 mg Oral BID  . lactose free nutrition  237 mL Oral TID WC  . mouth rinse  15 mL Mouth Rinse BID  . pantoprazole  40 mg Oral Daily  . potassium chloride  40 mEq Oral BID  . sildenafil  20 mg Oral TID  . sodium chloride flush  10 mL Intravenous Q12H  . spironolactone  12.5 mg Oral Daily  . warfarin  5 mg Oral q1800  . Warfarin - Physician Dosing Inpatient   Does not apply q1800    Infusions: . ceFEPime (MAXIPIME) IV Stopped (03/05/19 0307)  . milrinone 0.125 mcg/kg/min (03/05/19 0700)    PRN Medications: hydrALAZINE, ondansetron (ZOFRAN) IV, oxyCODONE, sodium chloride flush, sorbitol, traMADol     Assessment/Plan   1. Acute/Chronic Systolic HF -> cardiogenic shock - ECHO at [facility] EF, 20%.  - ECHO  repeated and showed severely reduced EF <20%. RV only mildly down - R/L cath 1/7 normal cors. Output ok   - s/p HM-3 on 02/26/2019 - CO-OX 55%. Continue milrinone at 0.25 today for RV support - Plan Ramp Echo Monday.   - VAD interrogated personally. Parameters stable.   2. VAD -  s/p HM-3 on 02/26/2019 -  RV down some. Continue milrinone for RV support  -  VAD interrogated personally. Parameters stable. - Continue sildenafil 20 mg three times a day.  - LDH 418 -> 431->412->373->352->355->379  - Stop heparin. On coumadin.  - INR therapeutic 2.0   3. Acute post-op respiratory failure with RUL PNA - Extubated 1/23.  - CXR 1/24 w/ RUL PNA. Ceftaz started, Day 5/7 - sputum cx  pending - WBC ct up slightly from 12>>15 >>20.6  - Repeat CXR 1/28 w/ no signs of progression, but with worsening cough and rising WBC ct, will broaden abx coverage. Stopped Ceftaz on 1/28 and started cefepime.    - Continue incentive spirometer.   4. Acute blood loss anemia - keep HGB > 8.0 - s/p transfusion x 1 unit 1/27 for hgb of 7.6.  - Hgb stable 9.5   5. Elevated bilirubin - 0.7 -> 4.7 -> 5.5 -> 5.0 -> -> 1.1->1 - likely combination of RV failure and underlying liver disease - hopefully will improved with diuresis and liver decongestion.   6. Moderate size right pneumothorax: new on CXR 1/25.  - s/p placement for Rt sided CT. F/u CXR showed complete resolution of apical pneumothorax.  - management per CT surgery   7. Hypokalemia/ Hypomangesemia: - Keep K >4.0 and Mg > 2.0  -K Stable.  - mag 1.9. Give 2 grams Mag now.    8. Deconditioning: - continue to ambulate w/ PT.  - May need HHPT    Length of Stay: 24  Darrick Grinder, NP  03/05/2019, 8:32 AM  Advanced Heart Failure Team Pager [phone] (M-F; 7a - 4p)  Please contact [facility] for night-coverage after hours (4p -7a ) and weekends on [email or web address]  Patient seen and examined with the above-signed Advanced Practice Provider and/or  Housestaff. I personally reviewed laboratory data, imaging studies and relevant notes. I independently examined the patient and formulated the important aspects of the plan. I have edited the note to reflect any of my changes or salient points. I have personally discussed the plan with the patient and/or family.  Remains on milrinone at 0.125. Co-ox marginal at 55%. Volume status looks pretty good with CVP 9. Stil with severe cough.  Abx switched to cefepime. WB 12 -> 15 -> 20k Tmax 99.5  Driveline site ok. VAD interrogated personally. Parameters stable. INR 2.0. No bleeding  General:  NAD. Weak appearing HEENT: normal  Neck: supple. JVP  9 Carotids 2+ bilat; no bruits. No lymphadenopathy or thryomegaly appreciated. Cor: LVAD hum.  Lungs: Coarse Abdomen:  soft, nontender, non-distended. No hepatosplenomegaly. No bruits or masses. Good bowel sounds. Driveline site clean. Anchor in place.  Extremities: no cyanosis, clubbing, rash. Warm no edema  Neuro: alert & oriented x 3. No focal deficits. Moves all 4 without problem   Will continue milrinone at 0.125. Continue sildenafil. Broaden abx to meropenum. Stop heparin.  Ramp echo Monday. D./w Dr. Ron Agee.   Glori Bickers, MD  9:22 AM

## 2019-03-05 NOTE — Progress Notes (Signed)
Physical Therapy Treatment Patient Details Name: Jon Martin MRN: [id number] DOB: 1985-06-01 Today's Date: 03/05/2019    History of Present Illness This 34 y.o. male admitted with acute/chronic systolic HF  with cardiogenic shock.  He underwent placement of HM 3 LVAD on 02/26/2019. Post op he developed RUL PNA, as well as acute blood loss anemia. CXR 1/25 w/ new moderate size right pneumothorax s/p CT placement. PMH includes: h/o poly substance abuse, dilated cardiomyopathy    PT Comments    Patient progressing well towards PT goals. Improved ambulation distance today without need for DME. Requires Min A to transfer lines from wall to/from batteries with some difficulty. Supervision needed for lines during mobility. Explained importance of ambulation three times per day and sitting up in chair. Pt agreeable. HR up to 127 bpm and RR up to 35 bpm during mobility. Reviewed what goes in black bag. Will continue to follow.  Follow Up Recommendations  Home health PT;Supervision for mobility/OOB     Equipment Recommendations  None recommended by PT    Recommendations for Other Services       Precautions / Restrictions Precautions Precautions: Sternal Precaution Booklet Issued: No Restrictions Weight Bearing Restrictions: Yes Other Position/Activity Restrictions: sternal precautions    Mobility  Bed Mobility Overal bed mobility: Modified Independent Bed Mobility: Supine to Sit;Sit to Supine     Supine to sit: HOB elevated;Supervision Sit to supine: HOB elevated;Supervision   General bed mobility comments: Supervision for safety/lines, no use of UEs.  Transfers Overall transfer level: Needs assistance Equipment used: None Transfers: Sit to/from Stand Sit to Stand: Supervision         General transfer comment: Supervision for safety/line management.  Ambulation/Gait Ambulation/Gait assistance: Supervision Gait Distance (Feet): 500 Feet Assistive device: None Gait  Pattern/deviations: Step-through pattern;Drifts right/left;Narrow base of support Gait velocity: good speed Gait velocity interpretation: >2.62 ft/sec, indicative of community ambulatory General Gait Details: Steady gait with no DME. HR up to 127 bpm. Drifts at times but no LOB. Little awarensess of lines.   Stairs             Wheelchair Mobility    Modified Rankin (Stroke Patients Only)       Balance Overall balance assessment: Needs assistance Sitting-balance support: Feet supported;No upper extremity supported Sitting balance-Leahy Scale: Good Sitting balance - Comments: Able to transfer self to batteries with Min A and cues.   Standing balance support: During functional activity Standing balance-Leahy Scale: Good Standing balance comment: Able to stand statically wihtout UE support, supervision                            Cognition Arousal/Alertness: Awake/alert Behavior During Therapy: WFL for tasks assessed/performed Overall Cognitive Status: Within Functional Limits for tasks assessed                                 General Comments: Upset that he does not get to rest for long periods without people coming in and waking him up.      Exercises      General Comments General comments (skin integrity, edema, etc.): VSS with max HR up to 127 bpm, RR up to 35.      Pertinent Vitals/Pain Pain Assessment: Faces Faces Pain Scale: Hurts a little bit Pain Location: 2 moments of sharp pains around his chest (reports them as on the surface) and dissipate quickly Pain  Descriptors / Indicators: Sharp Pain Intervention(s): Monitored during session    Home Living                      Prior Function            PT Goals (current goals can now be found in the care plan section) Progress towards PT goals: Progressing toward goals    Frequency    Min 3X/week      PT Plan Current plan remains appropriate    Co-evaluation               AM-PAC PT "6 Clicks" Mobility   Outcome Measure  Help needed turning from your back to your side while in a flat bed without using bedrails?: None Help needed moving from lying on your back to sitting on the side of a flat bed without using bedrails?: None Help needed moving to and from a bed to a chair (including a wheelchair)?: A Little Help needed standing up from a chair using your arms (e.g., wheelchair or bedside chair)?: A Little Help needed to walk in hospital room?: A Little Help needed climbing 3-5 steps with a railing? : A Little 6 Click Score: 20    End of Session   Activity Tolerance: Patient tolerated treatment well Patient left: in bed;with call bell/phone within reach Nurse Communication: Mobility status PT Visit Diagnosis: Pain Pain - part of body: (chest)     Time: 9323-5573 PT Time Calculation (min) (ACUTE ONLY): 27 min  Charges:  $Gait Training: 8-22 mins $Therapeutic Activity: 8-22 mins                     Jon Martin, PT, DPT Acute Rehabilitation Services Pager [phone] Office [phone]       Jon Martin 03/05/2019, 12:13 PM

## 2019-03-06 ENCOUNTER — Inpatient Hospital Stay (HOSPITAL_COMMUNITY): Payer: Medicaid - Out of State

## 2019-03-06 LAB — COMPREHENSIVE METABOLIC PANEL
ALT: 22 U/L (ref 0–44)
AST: 30 U/L (ref 15–41)
Albumin: 2.3 g/dL — ABNORMAL LOW (ref 3.5–5.0)
Alkaline Phosphatase: 64 U/L (ref 38–126)
Anion gap: 9 (ref 5–15)
BUN: 8 mg/dL (ref 6–20)
CO2: 22 mmol/L (ref 22–32)
Calcium: 8.6 mg/dL — ABNORMAL LOW (ref 8.9–10.3)
Chloride: 99 mmol/L (ref 98–111)
Creatinine, Ser: 0.62 mg/dL (ref 0.61–1.24)
GFR calc Af Amer: >60 mL/min (ref >60)
GFR calc non Af Amer: >60 mL/min (ref >60)
Glucose, Bld: 150 mg/dL — ABNORMAL HIGH (ref 70–99)
Potassium: 4 mmol/L (ref 3.5–5.1)
Sodium: 130 mmol/L — ABNORMAL LOW (ref 135–145)
Total Bilirubin: 1 mg/dL (ref 0.3–1.2)
Total Protein: 6.5 g/dL (ref 6.5–8.1)

## 2019-03-06 LAB — COOXEMETRY PANEL
Carboxyhemoglobin: 1.2 % (ref 0.5–1.5)
Methemoglobin: 0.6 % (ref 0.0–1.5)
O2 Saturation: 56.9 %
Total hemoglobin: 16.1 g/dL — ABNORMAL HIGH (ref 12.0–16.0)

## 2019-03-06 LAB — CBC
HCT: 29.7 % — ABNORMAL LOW (ref 39.0–52.0)
Hemoglobin: 9.7 g/dL — ABNORMAL LOW (ref 13.0–17.0)
MCH: 30.4 pg (ref 26.0–34.0)
MCHC: 32.7 g/dL (ref 30.0–36.0)
MCV: 93.1 fL (ref 80.0–100.0)
Platelets: 473 10*3/uL — ABNORMAL HIGH (ref 150–400)
RBC: 3.19 MIL/uL — ABNORMAL LOW (ref 4.22–5.81)
RDW: 14.6 % (ref 11.5–15.5)
WBC: 24.5 10*3/uL — ABNORMAL HIGH (ref 4.0–10.5)
nRBC: 0 % (ref 0.0–0.2)

## 2019-03-06 LAB — PROTIME-INR
INR: 2 — ABNORMAL HIGH (ref 0.8–1.2)
Prothrombin Time: 22.5 seconds — ABNORMAL HIGH (ref 11.4–15.2)

## 2019-03-06 LAB — DIGOXIN LEVEL: Digoxin Level: 0.2 ng/mL — ABNORMAL LOW (ref 0.8–2.0)

## 2019-03-06 LAB — LACTATE DEHYDROGENASE: LDH: 350 U/L — ABNORMAL HIGH (ref 98–192)

## 2019-03-06 MED ORDER — VANCOMYCIN HCL 750 MG/150ML IV SOLN: 750.0000 mg | Freq: Two times a day (BID) | INTRAVENOUS | Status: DC

## 2019-03-06 MED ORDER — GENERIC EXTERNAL MEDICATION
Status: DC
Start: ? — End: 2019-03-06

## 2019-03-06 MED ORDER — FUROSEMIDE 10 MG/ML IJ SOLN
40.0000 mg | Freq: Two times a day (BID) | INTRAMUSCULAR | Status: AC
  Administered 2019-03-06 (×2): 40 mg via INTRAVENOUS
  Filled 2019-03-06 (×2): qty 4

## 2019-03-06 MED ORDER — POTASSIUM CHLORIDE CRYS ER 20 MEQ PO TBCR
40.0000 meq | EXTENDED_RELEASE_TABLET | Freq: Once | ORAL | Status: AC
  Administered 2019-03-06: 40 meq via ORAL
  Filled 2019-03-06: qty 2

## 2019-03-06 MED ORDER — VANCOMYCIN HCL IN DEXTROSE 1-5 GM/200ML-% IV SOLN
1000.0000 mg | Freq: Two times a day (BID) | INTRAVENOUS | Status: DC
  Administered 2019-03-06 – 2019-03-09 (×8): 1000 mg via INTRAVENOUS
  Filled 2019-03-06 (×10): qty 200

## 2019-03-06 MED ORDER — WARFARIN SODIUM 5 MG PO TABS
5.0000 mg | ORAL_TABLET | Freq: Every day | ORAL | Status: DC
  Administered 2019-03-06 – 2019-03-08 (×3): 5 mg via ORAL
  Filled 2019-03-06 (×3): qty 1

## 2019-03-06 NOTE — Progress Notes (Signed)
HeartMate 3 Rounding Note Heartmate 3 Implanted 02-26-2019 Subjective:    34 yo with nonischemic  cardiomyopathy, mod RV dysfunction and inotrope dependent Intermacs 2 cardiogenic shock preop POD # 8   Hx ETOH, drugs, cirrhosis HM3 implant with inotrope supported RV dysfunction Operative bronchoscopy needed for secretion induced RUL collapse- finishing  Fortaz/Cefixime for residual infiltrate and postop rising WBC Coagulopathy related to liver disease, HFwith expected perio transfusion requirement  Ambulating well with PT Patient has wet cough, temperature last night 101.2 and white count increased to 24,000.  Chest x-ray shows hazy left lower lobe changes.  Antibiotics now escalated to meropenem and vancomycin.  Surgical incisions look clean and dry.  PICC line site clean and dry.  MAP better with diuresis, pain control- prn apresoline Sinus tach 108 off amiodarone  He has been diuresing well - betterO2 sats Wt up 1kg to 52 kg  VATD parameters are satisfactory with very few PI events, flow now 3.8- 4 liters .  coox this am 58% on .125 milrinone- will r   Low dose heparin started as bridge for coumadin loading, LDH 340, INR after 2 doses coumadin 1.5> 1.2 > 1.5.>1.5 > 2.0 > 2.0  Heparin stopped and coumadin ordered this pm. Will get EPWs out probably Monday  LVAD INTERROGATION:  HeartMate III LVAD:  Flow 4.0 liters/min, speed 5500, power 4.8, PI 3.4.     Objective:    Vital Signs:   Temp:  [98.5 F (36.9 C)-101.4 F (38.6 C)] 99.5 F (37.5 C) (01/30 0800) Pulse Rate:  [28-124] 88 (01/30 0800) Resp:  [19-37] 19 (01/30 0800) BP: (82-111)/(40-89) 92/73 (01/30 0800) SpO2:  [89 %-100 %] 94 % (01/30 0800) Weight:  [51.3 kg] 51.3 kg (01/30 0600) Last BM Date: 03/05/19 Mean arterial Pressure 80-90  Intake/Output:   Intake/Output Summary (Last 24 hours) at 03/06/2019 0919 Last data filed at 03/06/2019 0800 Gross per 24 hour  Intake 1362.21 ml  Output 2075 ml  Net -712.79 ml      Physical Exam: HEENT: normal Wounds: sternal incision and DL exit clean and dry General:  Extubated, neuro intact, c/o surgical pain  ENT : JVP normal Carotids; no bruits. No lymphadenopathy or thryomegaly appreciated. Cor: Mechanical heart sounds with LVAD hum present. Lungs: Decreased breath sounds left base Abdomen: soft, nontender, nondistended. No hepatosplenomegaly. No bruits or masses. Good bowel sounds. Extremities: no cyanosis, clubbing, rash, edema Neuro: alert & orientedx3, cranial nerves grossly intact. moves all 4 extremities w/o difficulty. Affect pleasant  Telemetry: nsr rate 100  Labs: Basic Metabolic Panel: Recent Labs  Lab 03/01/19 0332 03/01/19 0644 03/02/19 0427 03/02/19 0427 03/02/19 1430 03/02/19 1430 03/03/19 0434 03/03/19 0434 03/04/19 0312 03/05/19 0343 03/06/19 0500  NA 129*   < > 134*   < > 130*  --  130*  --  128* 130* 130*  K 3.4*   < > 2.6*   < > 3.5  --  3.4*  --  4.1 4.7 4.0  CL 92*   < > 97*   < > 93*  --  94*  --  95* 100 99  CO2 22   < > 24   < > 25  --  27  --  24 23 22   GLUCOSE 114*   < > 92   < > 124*  --  123*  --  135* 121* 150*  BUN 13   < > 16   < > 18  --  12  --  12 8  8  CREATININE 0.93   < > 0.95   < > 0.85  --  0.68  --  0.58* 0.52* 0.62  CALCIUM 8.8*   < > 8.0*   < > 8.0*   < > 8.3*   < > 8.5* 8.4* 8.6*  MG 1.8  --  1.7  --   --   --  1.4*  --  1.8 1.9  --   PHOS 4.7*  --  2.9  --   --   --  1.4*  --  1.9* 2.2*  --    < > = values in this interval not displayed.    Liver Function Tests: Recent Labs  Lab 03/02/19 0427 03/03/19 0434 03/04/19 0312 03/05/19 0343 03/06/19 0500  AST 37 32 31 33 30  ALT 23 21 23 23 22   ALKPHOS 66 66 70 65 64  BILITOT 2.4* 1.1 1.1 1.0 1.0  PROT 5.7* 5.8* 6.1* 6.3* 6.5  ALBUMIN 2.4* 2.3* 2.3* 2.4* 2.3*   No results for input(s): LIPASE, AMYLASE in the last 168 hours. No results for input(s): AMMONIA in the last 168 hours.  CBC: Recent Labs  Lab 03/01/19 0332 03/01/19 0644  03/02/19 0427 03/02/19 0505 03/03/19 0434 03/03/19 1547 03/04/19 0312 03/05/19 0343 03/06/19 0500  WBC 18.8*   < > 8.4   < > 13.5* 12.3* 15.5* 20.6* 24.5*  NEUTROABS 14.7*  --  5.9  --  8.3*  --  9.8* 14.3*  --   HGB 8.9*   < > 4.8*   < > 7.6* 9.4* 9.7* 9.5* 9.7*  HCT 25.3*   < > 14.3*   < > 22.6* 28.1* 28.4* 28.5* 29.7*  MCV 90.0   < > 91.7   < > 92.6 92.4 89.9 91.6 93.1  PLT 236   < > 137*   < > 233 266 313 381 473*   < > = values in this interval not displayed.    INR: Recent Labs  Lab 03/02/19 0427 03/03/19 0434 03/04/19 0312 03/05/19 0343 03/06/19 0500  INR 1.2 1.5* 1.5* 2.0* 2.0*    Other results:  EKG:   Imaging: DG Chest Port 1 View  Result Date: 03/06/2019 CLINICAL DATA:  Initial evaluation for LVAD. EXAM: PORTABLE CHEST 1 VIEW COMPARISON:  Prior radiograph from 03/05/2019. FINDINGS: Right PICC catheter in stable position with tip overlying the cavoatrial junction. Left ventricular assist device in stable position. Prior median sternotomy. Stable cardiomegaly. Persistent dense opacity at the retrocardiac left lower lobe. Small left pleural effusion, decreased from previous. Scattered opacities within the bilateral perihilar regions slightly improved from previous, likely improving atelectasis and/or edema. Pulmonary vascular congestion also improved from previous. No new infiltrates. No pneumothorax. IMPRESSION: 1. Stable position of LVAD and right PICC catheter. 2. Interval decrease in left pleural effusion with persistent dense left basilar opacity, atelectasis or consolidation. Bilateral perihilar opacities slightly improved from previous, likely improving atelectasis and/or edema. 3. Stable cardiomegaly with improved pulmonary venous congestion from previous. Electronically Signed   By: 03/07/2019 M.D.   On: 03/06/2019 07:09   DG Chest Port 1 View  Result Date: 03/05/2019 CLINICAL DATA:  Left ventricular assist device.  Cough. EXAM: PORTABLE CHEST 1 VIEW  COMPARISON:  02/24/2019. FINDINGS: Interim removal of right chest tube. Right PICC line stable position. Left ventricular assist device in stable position. Prior median sternotomy. Stable cardiomegaly. Persistent prominent left base atelectasis/consolidation and left-sided pleural effusion. New infiltrate/edema left upper lung. Persistent right mid lung field  and right base subsegmental atelectasis. Small right pleural effusion. No pneumothorax. IMPRESSION: 1. Interim removal of right chest tube. No pneumothorax. Right PICC line and stable position. 2. Prior median sternotomy. Left ventricular assist device in stable position. Stable cardiomegaly. No pulmonary venous congestion. 3. Persistent prominent left base atelectasis/consolidation and left-sided pleural effusion. New infiltrate/edema left upper lung. 4. Persistent right mid lung field and right base subsegmental atelectasis. Small right pleural effusion. Electronically Signed   By: Osgood   On: 03/05/2019 06:04     Medications:     Scheduled Medications: . aspirin EC  81 mg Oral Daily  . bisacodyl  10 mg Oral Daily   Or  . bisacodyl  10 mg Rectal Daily  . Chlorhexidine Gluconate Cloth  6 each Topical Daily  . chlorpheniramine-HYDROcodone  5 mL Oral Q12H  . digoxin  0.125 mg Oral Daily  . docusate sodium  200 mg Oral Daily  . furosemide  40 mg Intravenous BID  . guaiFENesin  600 mg Oral BID  . lactose free nutrition  237 mL Oral TID WC  . mouth rinse  15 mL Mouth Rinse BID  . pantoprazole  40 mg Oral Daily  . potassium chloride  40 mEq Oral Once  . sildenafil  20 mg Oral TID  . sodium chloride flush  10 mL Intravenous Q12H  . spironolactone  12.5 mg Oral Daily  . warfarin  5 mg Oral q1800  . Warfarin - Physician Dosing Inpatient   Does not apply q1800    Infusions: . sodium chloride 10 mL/hr at 03/06/19 0800  . meropenem (MERREM) IV Stopped (03/06/19 0305)  . milrinone 0.125 mcg/kg/min (03/06/19 0850)  . vancomycin       PRN Medications: sodium chloride, hydrALAZINE, ondansetron (ZOFRAN) IV, oxyCODONE, sodium chloride flush, sorbitol, traMADol   Assessment:  Postop coagulopathy- resolved Expected posop blood loss anemia -hemoglobin stable 9.5 VAD parameters satisfactorywith flow 4.2 L Airway secretions/ RUL atelectasis- improved but with cough and rising WBC will continue iv antibiotics  Sternal incision, DL incision clean, dry Postop bilrubin 4.2 >> 5.4>> 5.0 >> 2.4,  >> 1.2 follow Postop elevated MAP- now improved   Plan/Discussion:     Continue milrinone for RV support at .125, daily coox Ambulate with PT Heart healthy diet, pulmonary toilet with flutter valve and incentive spirometry   INR   2.0 DC low-dose heparin bridge and continue oral Coumadin dosing. ASA to 81 mg  I reviewed the LVAD parameters from today, and compared the results to the patient's prior recorded data.  No programming changes were made.  The LVAD is functioning within specified parameters.  The patient performs LVAD self-test daily.  LVAD interrogation was negative for any significant power changes, alarms or PI events/speed drops.  LVAD equipment check completed and is in good working order.  Back-up equipment present.   LVAD education done on emergency procedures and precautions and reviewed exit site care.  Length of Stay: [place] III 03/06/2019, 9:19 AM

## 2019-03-06 NOTE — Progress Notes (Signed)
Pharmacy Antibiotic Note  Jon Martin is a 34 y.o. male admitted on 02/09/2019 s/p LVAD. Post-op abx was broaded to ceftaz for possible pna. Yesterday coverage broadened to cefepime d/t increasing WBC and increased cough. Pharmacy has been consulted for meropenem dosing.  Pt w/ temp up 101.4 overnight, WBC up to 24.5. SCr stable at 0.62.  Pharmacy asked to broaden coverage with vancomycin for suspected PNA.   Plan: Add vancomycin 1g IV q 12 hrs.  Calc AUC 508 Continue Meropenem 1 gm IV q8h Monitor clinical improvement, renal function, and abx narrowing when appropriate  Height: 5\' 8"  (172.7 cm) Weight: 113 lb 1.5 oz (51.3 kg) IBW/kg (Calculated) : 68.4  Temp (24hrs), Avg:99.8 F (37.7 C), Min:98.5 F (36.9 C), Max:101.4 F (38.6 C)  Recent Labs  Lab 03/02/19 1042 03/02/19 1430 03/03/19 0434 03/03/19 1547 03/04/19 0312 03/05/19 0343 03/06/19 0500  WBC   < >  --  13.5* 12.3* 15.5* 20.6* 24.5*  CREATININE  --  0.85 0.68  --  0.58* 0.52* 0.62   < > = values in this interval not displayed.    Estimated Creatinine Clearance: 95.3 mL/min (by C-G formula based on SCr of 0.62 mg/dL).    Ceftaz 1/24 > 1/28 Cefepime 1/28 >>1/29 Cefuroxime 1/22>1/24 Vancomycin 1/22 >1/23 Meropenem 1/29 >   1/15 BCx x 2: neg 1/9 Resp Cx: rare budding yeast  3/9, [facility] Clinical Pharmacist Phone [phone]  03/06/2019 9:06 AM

## 2019-03-06 NOTE — Progress Notes (Signed)
Patient ID: Jon Martin, male   DOB: 1985-05-21, 34 y.o.   MRN: [id number]     Advanced Heart Failure Rounding Note  PCP-Cardiologist: No primary care provider on file.   Subjective:    34 y.o. 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  Underwent HM-3 placement 02/26/2019.  Extubated 1/23. CXR 1/25 w/ new moderate size right pneumothorax s/p CT placement. CT removed 1/28    On cefepime with ?PNA.  Has dense left basilar opacity. WBCs up from 12>>15>>20>>24.5. Fever to 101.4 this morning.    Milrinone weaned down to 0.125 on 1/29. Co-ox 57% this morning.  CVP 14.   Hgb improved after transfusion, received 1 unit PRBCs on 1/27. Hgb 7.6>>9.4>>9.7>>9.5>>9.7.   Complaining of fatigue, chest pain with coughing. Denies SOB.   LVAD Interrogation HM 3: Speed: 5500 Flow 4.2  PI: 3.9 Power: 4. 8 PI events/24 hrs. VAD interrogated personally. Parameters stable.  Objective:   Weight Range: 51.3 kg Body mass index is 17.2 kg/m.   Vital Signs:   Temp:  [98.5 F (36.9 C)-101.4 F (38.6 C)] 99.5 F (37.5 C) (01/30 0800) Pulse Rate:  [28-124] 88 (01/30 0800) Resp:  [19-37] 19 (01/30 0800) BP: (82-111)/(40-89) 92/73 (01/30 0800) SpO2:  [89 %-100 %] 94 % (01/30 0800) Weight:  [51.3 kg] 51.3 kg (01/30 0600) Last BM Date: 03/05/19  Weight change: Filed Weights   03/03/19 0600 03/05/19 0900 03/06/19 0600  Weight: 52.4 kg 49.1 kg 51.3 kg    Intake/Output:   Intake/Output Summary (Last 24 hours) at 03/06/2019 0911 Last data filed at 03/06/2019 0800 Gross per 24 hour  Intake 1362.21 ml  Output 2075 ml  Net -712.79 ml      Physical Exam  CVP 14  General: Well appearing this am. NAD.  HEENT: Normal. Neck: Supple, JVP 10 cm. Carotids OK.  Cardiac:  Mechanical heart sounds with LVAD hum present.  Lungs:  CTAB, normal effort.  Abdomen:  NT, ND, no HSM. No bruits or masses. +BS  LVAD exit site: Well-healed and incorporated. Dressing dry and intact. No  erythema or drainage. Stabilization device present and accurately applied. Driveline dressing changed daily per sterile technique. Extremities:  Warm and dry. No cyanosis, clubbing, rash, or edema.  Neuro:  Alert & oriented x 3. Cranial nerves grossly intact. Moves all 4 extremities w/o difficulty. Affect pleasant     Telemetry  Sinus Tach 110-120s (personally reviewed)  Labs    CBC Recent Labs    03/04/19 0312 03/04/19 0312 03/05/19 0343 03/06/19 0500  WBC 15.5*   < > 20.6* 24.5*  NEUTROABS 9.8*  --  14.3*  --   HGB 9.7*   < > 9.5* 9.7*  HCT 28.4*   < > 28.5* 29.7*  MCV 89.9   < > 91.6 93.1  PLT 313   < > 381 473*   < > = values in this interval not displayed.   Basic Metabolic Panel Recent Labs    03/04/19 0312 03/04/19 0312 03/05/19 0343 03/06/19 0500  NA 128*   < > 130* 130*  K 4.1   < > 4.7 4.0  CL 95*   < > 100 99  CO2 24   < > 23 22  GLUCOSE 135*   < > 121* 150*  BUN 12   < > 8 8  CREATININE 0.58*   < > 0.52* 0.62  CALCIUM 8.5*   < > 8.4* 8.6*  MG 1.8  --  1.9  --   PHOS 1.9*  --  2.2*  --    < > = values in this interval not displayed.   Liver Function Tests Recent Labs    03/05/19 0343 03/06/19 0500  AST 33 30  ALT 23 22  ALKPHOS 65 64  BILITOT 1.0 1.0  PROT 6.3* 6.5  ALBUMIN 2.4* 2.3*   No results for input(s): LIPASE, AMYLASE in the last 72 hours. Cardiac Enzymes No results for input(s): CKTOTAL, CKMB, CKMBINDEX, TROPONINI in the last 72 hours.  BNP: BNP (last 3 results) Recent Labs    02/10/19 0219 02/27/19 0433 03/05/19 0056  BNP 2,048.6* 447.0* 427.2*    ProBNP (last 3 results) No results for input(s): PROBNP in the last 8760 hours.   D-Dimer No results for input(s): DDIMER in the last 72 hours. Hemoglobin A1C No results for input(s): HGBA1C in the last 72 hours. Fasting Lipid Panel No results for input(s): CHOL, HDL, LDLCALC, TRIG, CHOLHDL, LDLDIRECT in the last 72 hours. Thyroid Function Tests No results for input(s):  TSH, T4TOTAL, T3FREE, THYROIDAB in the last 72 hours.  Invalid input(s): FREET3  Other results:   Imaging    DG Chest Port 1 View  Result Date: 03/06/2019 CLINICAL DATA:  Initial evaluation for LVAD. EXAM: PORTABLE CHEST 1 VIEW COMPARISON:  Prior radiograph from 03/05/2019. FINDINGS: Right PICC catheter in stable position with tip overlying the cavoatrial junction. Left ventricular assist device in stable position. Prior median sternotomy. Stable cardiomegaly. Persistent dense opacity at the retrocardiac left lower lobe. Small left pleural effusion, decreased from previous. Scattered opacities within the bilateral perihilar regions slightly improved from previous, likely improving atelectasis and/or edema. Pulmonary vascular congestion also improved from previous. No new infiltrates. No pneumothorax. IMPRESSION: 1. Stable position of LVAD and right PICC catheter. 2. Interval decrease in left pleural effusion with persistent dense left basilar opacity, atelectasis or consolidation. Bilateral perihilar opacities slightly improved from previous, likely improving atelectasis and/or edema. 3. Stable cardiomegaly with improved pulmonary venous congestion from previous. Electronically Signed   By: Rise Mu M.D.   On: 03/06/2019 07:09     Medications:     Scheduled Medications: . aspirin EC  81 mg Oral Daily  . bisacodyl  10 mg Oral Daily   Or  . bisacodyl  10 mg Rectal Daily  . Chlorhexidine Gluconate Cloth  6 each Topical Daily  . chlorpheniramine-HYDROcodone  5 mL Oral Q12H  . digoxin  0.125 mg Oral Daily  . docusate sodium  200 mg Oral Daily  . furosemide  40 mg Intravenous BID  . guaiFENesin  600 mg Oral BID  . lactose free nutrition  237 mL Oral TID WC  . mouth rinse  15 mL Mouth Rinse BID  . pantoprazole  40 mg Oral Daily  . potassium chloride  40 mEq Oral Once  . sildenafil  20 mg Oral TID  . sodium chloride flush  10 mL Intravenous Q12H  . spironolactone  12.5 mg Oral  Daily  . warfarin  5 mg Oral q1800  . Warfarin - Physician Dosing Inpatient   Does not apply q1800    Infusions: . sodium chloride 10 mL/hr at 03/06/19 0800  . meropenem (MERREM) IV Stopped (03/06/19 0305)  . milrinone 0.125 mcg/kg/min (03/06/19 0850)  . vancomycin      PRN Medications: sodium chloride, hydrALAZINE, ondansetron (ZOFRAN) IV, oxyCODONE, sodium chloride flush, sorbitol, traMADol     Assessment/Plan   1. Acute/Chronic Systolic HF -> cardiogenic shock -  ECHO at [facility] EF, 20%.  - ECHO repeated and showed severely reduced EF <20%. RV only mildly down - R/L cath 1/7 normal cors. Output ok   - s/p HM-3 on 02/26/2019 - CO-OX 57%. Continue milrinone at 0.125 today for RV support, will not stop today (possibly tomorrow) - Plan Ramp Echo Monday.   - VAD interrogated personally. Parameters stable.  - CVP 14, Lasix 40 mg IV bid x 2 doses today.   2. VAD -  s/p HM-3 on 02/26/2019 -  RV down some. Continue milrinone 0.125 for RV support  -  VAD interrogated personally. Parameters stable. - Continue sildenafil 20 mg three times a day.  - LDH 418 -> 431->412->373->352->355->379 -> 350 - Continue warfarin + ASA 81 - INR therapeutic 2.0   3. Acute post-op respiratory failure with RUL PNA - Extubated 1/23.  - He is on cefepime for possible LLL PNA, Tm 101.4. WBCs up to 24.5.  Will reculture today and broaden abx to include vancomycin.   4. Acute blood loss anemia - keep HGB > 8.0 - s/p transfusion x 1 unit 1/27 for hgb of 7.6.  - Hgb stable 9.5   5. Elevated bilirubin - Resolved.   6. Moderate size right pneumothorax: new on CXR 1/25.  - s/p placement for Rt sided CT. F/u CXR showed complete resolution of apical pneumothorax.  - management per CT surgery   7. Hypokalemia/ Hypomangesemia: - Keep K >4.0 and Mg > 2.0    8. Deconditioning: - continue to ambulate w/ PT.  - May need HHPT    Length of Stay: 25  Marca Ancona, MD  03/06/2019, 9:11 AM  Advanced  Heart Failure Team Pager [phone] (M-F; 7a - 4p)  Please contact [facility] for night-coverage after hours (4p -7a ) and weekends on [email or web address]

## 2019-03-07 ENCOUNTER — Inpatient Hospital Stay (HOSPITAL_COMMUNITY): Payer: Medicaid - Out of State

## 2019-03-07 LAB — BASIC METABOLIC PANEL
Anion gap: 10 (ref 5–15)
BUN: 8 mg/dL (ref 6–20)
CO2: 24 mmol/L (ref 22–32)
Calcium: 8.6 mg/dL — ABNORMAL LOW (ref 8.9–10.3)
Chloride: 97 mmol/L — ABNORMAL LOW (ref 98–111)
Creatinine, Ser: 0.65 mg/dL (ref 0.61–1.24)
GFR calc Af Amer: >60 mL/min (ref >60)
GFR calc non Af Amer: >60 mL/min (ref >60)
Glucose, Bld: 119 mg/dL — ABNORMAL HIGH (ref 70–99)
Potassium: 4 mmol/L (ref 3.5–5.1)
Sodium: 131 mmol/L — ABNORMAL LOW (ref 135–145)

## 2019-03-07 LAB — URINE CULTURE
Culture: NO GROWTH
Special Requests: NORMAL

## 2019-03-07 LAB — LACTATE DEHYDROGENASE: LDH: 337 U/L — ABNORMAL HIGH (ref 98–192)

## 2019-03-07 LAB — CBC
HCT: 27.8 % — ABNORMAL LOW (ref 39.0–52.0)
Hemoglobin: 9.1 g/dL — ABNORMAL LOW (ref 13.0–17.0)
MCH: 30.1 pg (ref 26.0–34.0)
MCHC: 32.7 g/dL (ref 30.0–36.0)
MCV: 92.1 fL (ref 80.0–100.0)
Platelets: 514 10*3/uL — ABNORMAL HIGH (ref 150–400)
RBC: 3.02 MIL/uL — ABNORMAL LOW (ref 4.22–5.81)
RDW: 14.4 % (ref 11.5–15.5)
WBC: 22.2 10*3/uL — ABNORMAL HIGH (ref 4.0–10.5)
nRBC: 0 % (ref 0.0–0.2)

## 2019-03-07 LAB — PROTIME-INR
INR: 2.2 — ABNORMAL HIGH (ref 0.8–1.2)
Prothrombin Time: 24 seconds — ABNORMAL HIGH (ref 11.4–15.2)

## 2019-03-07 LAB — COOXEMETRY PANEL
Carboxyhemoglobin: 1.9 % — ABNORMAL HIGH (ref 0.5–1.5)
Methemoglobin: 1.2 % (ref 0.0–1.5)
O2 Saturation: 63.4 %
Total hemoglobin: 9.6 g/dL — ABNORMAL LOW (ref 12.0–16.0)

## 2019-03-07 MED ORDER — FUROSEMIDE 40 MG PO TABS
40.0000 mg | ORAL_TABLET | Freq: Every day | ORAL | Status: DC
  Administered 2019-03-07 – 2019-03-08 (×2): 40 mg via ORAL
  Filled 2019-03-07 (×2): qty 1

## 2019-03-07 NOTE — Progress Notes (Signed)
Existing VAD dressing removed and site care performed using sterile technique. Drive line exit site cleaned with Chlora prep applicators x 2, allowed to dry, andgauze dressing withsilver stripre-applied. Exit site with 2 sutures intact,the velour is fully implanted at exit site. Small amount of bloody drainage at exit site and on gauze (VAD coordinator made aware). Slight redness around exit site with notenderness, foul odor or rash noted. Drive line anchor x 2 re-applied.   Next dressing change due 03/08/19 by either VAD coordinator or nurse champion.

## 2019-03-07 NOTE — Progress Notes (Signed)
Patient ID: Jon Martin, male   DOB: 10-03-1985, 34 y.o.   MRN: [id number]     Advanced Heart Failure Rounding Note  PCP-Cardiologist: No primary care provider on file.   Subjective:    34 y.o. 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  Underwent HM-3 placement 02/26/2019.  Extubated 1/23. CXR 1/25 w/ new moderate size right pneumothorax s/p CT placement. CT removed 1/28    On vancomycin/cefepime with ?PNA.  CXR today with LLL ?atelectasis. WBCs 12>>15>>20>>24.5>>22. Afebrile this morning.    Milrinone weaned down to 0.125 on 1/29. Co-ox 63% this morning.  Good diuresis with IV Lasix yesterday, CVP down to 9.   Hgb 7.6>>9.4>>9.7>>9.5>>9.7>>9.1.   No complaints today, ready to go on walk.   LVAD Interrogation HM 3: Speed: 5500 Flow 4.4  PI: 2.9 Power: 4. 15 PI events/24 hrs. VAD interrogated personally. Parameters stable.  Objective:   Weight Range: 49.7 kg Body mass index is 16.66 kg/m.   Vital Signs:   Temp:  [98.3 F (36.8 C)-100.4 F (38 C)] 98.3 F (36.8 C) (01/31 0400) Pulse Rate:  [42-130] 58 (01/31 0600) Resp:  [15-43] 21 (01/31 0800) BP: (68-113)/(37-102) 96/78 (01/31 0800) SpO2:  [84 %-98 %] 97 % (01/31 0600) Weight:  [49.7 kg] 49.7 kg (01/31 0600) Last BM Date: 03/06/19  Weight change: Filed Weights   03/05/19 0900 03/06/19 0600 03/07/19 0600  Weight: 49.1 kg 51.3 kg 49.7 kg    Intake/Output:   Intake/Output Summary (Last 24 hours) at 03/07/2019 0847 Last data filed at 03/07/2019 0800 Gross per 24 hour  Intake 1288.97 ml  Output 2750 ml  Net -1461.03 ml      Physical Exam  CVP 9  General: Well appearing this am. NAD.  HEENT: Normal. Neck: Supple, JVP 8 cm. Carotids OK.  Cardiac:  Mechanical heart sounds with LVAD hum present.  Lungs:  CTAB, normal effort.  Abdomen:  NT, ND, no HSM. No bruits or masses. +BS  LVAD exit site: Well-healed and incorporated. Dressing dry and intact. No erythema or drainage.  Stabilization device present and accurately applied. Driveline dressing changed daily per sterile technique. Extremities:  Warm and dry. No cyanosis, clubbing, rash, or edema.  Neuro:  Alert & oriented x 3. Cranial nerves grossly intact. Moves all 4 extremities w/o difficulty. Affect pleasant     Telemetry  Sinus Tach 110-120s (personally reviewed)  Labs    CBC Recent Labs    03/05/19 0343 03/05/19 0343 03/06/19 0500 03/07/19 0315  WBC 20.6*   < > 24.5* 22.2*  NEUTROABS 14.3*  --   --   --   HGB 9.5*   < > 9.7* 9.1*  HCT 28.5*   < > 29.7* 27.8*  MCV 91.6   < > 93.1 92.1  PLT 381   < > 473* 514*   < > = values in this interval not displayed.   Basic Metabolic Panel Recent Labs    03/05/19 0343 03/05/19 0343 03/06/19 0500 03/07/19 0315  NA 130*   < > 130* 131*  K 4.7   < > 4.0 4.0  CL 100   < > 99 97*  CO2 23   < > 22 24  GLUCOSE 121*   < > 150* 119*  BUN 8   < > 8 8  CREATININE 0.52*   < > 0.62 0.65  CALCIUM 8.4*   < > 8.6* 8.6*  MG 1.9  --   --   --  PHOS 2.2*  --   --   --    < > = values in this interval not displayed.   Liver Function Tests Recent Labs    03/05/19 0343 03/06/19 0500  AST 33 30  ALT 23 22  ALKPHOS 65 64  BILITOT 1.0 1.0  PROT 6.3* 6.5  ALBUMIN 2.4* 2.3*   No results for input(s): LIPASE, AMYLASE in the last 72 hours. Cardiac Enzymes No results for input(s): CKTOTAL, CKMB, CKMBINDEX, TROPONINI in the last 72 hours.  BNP: BNP (last 3 results) Recent Labs    02/10/19 0219 02/27/19 0433 03/05/19 0056  BNP 2,048.6* 447.0* 427.2*    ProBNP (last 3 results) No results for input(s): PROBNP in the last 8760 hours.   D-Dimer No results for input(s): DDIMER in the last 72 hours. Hemoglobin A1C No results for input(s): HGBA1C in the last 72 hours. Fasting Lipid Panel No results for input(s): CHOL, HDL, LDLCALC, TRIG, CHOLHDL, LDLDIRECT in the last 72 hours. Thyroid Function Tests No results for input(s): TSH, T4TOTAL, T3FREE,  THYROIDAB in the last 72 hours.  Invalid input(s): FREET3  Other results:   Imaging    DG Chest Port 1 View  Result Date: 03/07/2019 CLINICAL DATA:  Left ventricular assist device. EXAM: PORTABLE CHEST 1 VIEW COMPARISON:  03/06/2019 FINDINGS: Right arm PICC tip unchanged in the SVC above the right atrium. Left ventricular cyst device without radiographic change. Small amount of pleural density on the left has not increased. Associated volume loss of the left lower lung. Mild perihilar atelectasis and basilar atelectasis on the right appears similar. IMPRESSION: No change since yesterday. Small amount of pleural density on the left has not increased. Associated atelectasis in the left lower lung. Mild atelectasis on the right is stable. Electronically Signed   By: Paulina Fusi M.D.   On: 03/07/2019 06:33     Medications:     Scheduled Medications: . aspirin EC  81 mg Oral Daily  . bisacodyl  10 mg Oral Daily   Or  . bisacodyl  10 mg Rectal Daily  . Chlorhexidine Gluconate Cloth  6 each Topical Daily  . chlorpheniramine-HYDROcodone  5 mL Oral Q12H  . digoxin  0.125 mg Oral Daily  . docusate sodium  200 mg Oral Daily  . furosemide  40 mg Oral Daily  . guaiFENesin  600 mg Oral BID  . lactose free nutrition  237 mL Oral TID WC  . mouth rinse  15 mL Mouth Rinse BID  . pantoprazole  40 mg Oral Daily  . sildenafil  20 mg Oral TID  . sodium chloride flush  10 mL Intravenous Q12H  . spironolactone  12.5 mg Oral Daily  . warfarin  5 mg Oral q1800  . Warfarin - Physician Dosing Inpatient   Does not apply q1800    Infusions: . sodium chloride 10 mL/hr at 03/07/19 0800  . meropenem (MERREM) IV Stopped (03/07/19 0234)  . vancomycin Stopped (03/06/19 2245)    PRN Medications: sodium chloride, hydrALAZINE, ondansetron (ZOFRAN) IV, oxyCODONE, sodium chloride flush, sorbitol, traMADol     Assessment/Plan   1. Acute/Chronic Systolic HF -> cardiogenic shock - ECHO at [facility] EF,  20%.  - ECHO repeated and showed severely reduced EF <20%. RV only mildly down - R/L cath 1/7 normal cors. Output ok   - s/p HM-3 on 02/26/2019 - CO-OX 63%. Stop milrinone today. - Plan Ramp Echo Monday.  - VAD interrogated personally. Parameters stable.  - CVP 9.  No  more IV Lasix, start Lasix 40 mg po daily.   2. VAD - s/p HM-3 on 02/26/2019 - RV down some. Continue milrinone 0.125 for RV support  - VAD interrogated personally. Parameters stable. - Continue sildenafil 20 mg three times a day.  - LDH 418 -> 431->412->373->352->355->379 -> 350 -> 337 - Continue warfarin + ASA 81 - INR therapeutic 2.2   3. Acute post-op respiratory failure with RUL PNA - Extubated 1/23.  - He is on vancomycin/cefepime for possible LLL PNA, afebrile today. WBCs down some to 22.  Cultures NGTD.   4. Acute blood loss anemia - keep HGB > 8.0 - s/p transfusion x 1 unit 1/27 for hgb of 7.6.  - Hgb stable 9.1   5. Elevated bilirubin - Resolved.   6. Moderate size right pneumothorax: new on CXR 1/25.  - s/p placement for Rt sided CT. F/u CXR showed complete resolution of apical pneumothorax.  - management per CT surgery   7. Hypokalemia/ Hypomangesemia: - Keep K >4.0 and Mg > 2.0    8. Deconditioning: - continue to ambulate w/ PT.  - May need HHPT    Length of Stay: 75  Marca Ancona, MD  03/07/2019, 8:47 AM  Advanced Heart Failure Team Pager [phone] (M-F; 7a - 4p)  Please contact [facility] for night-coverage after hours (4p -7a ) and weekends on [email or web address]

## 2019-03-08 ENCOUNTER — Inpatient Hospital Stay (HOSPITAL_COMMUNITY): Payer: Medicaid - Out of State

## 2019-03-08 ENCOUNTER — Telehealth (HOSPITAL_COMMUNITY): Payer: Self-pay | Admitting: Licensed Clinical Social Worker

## 2019-03-08 DIAGNOSIS — J690 Pneumonitis due to inhalation of food and vomit: Secondary | ICD-10-CM | POA: Diagnosis not present

## 2019-03-08 DIAGNOSIS — Z681 Body mass index (BMI) 19 or less, adult: Secondary | ICD-10-CM | POA: Diagnosis not present

## 2019-03-08 DIAGNOSIS — F17210 Nicotine dependence, cigarettes, uncomplicated: Secondary | ICD-10-CM | POA: Diagnosis present

## 2019-03-08 DIAGNOSIS — D62 Acute posthemorrhagic anemia: Secondary | ICD-10-CM | POA: Diagnosis not present

## 2019-03-08 DIAGNOSIS — F1410 Cocaine abuse, uncomplicated: Secondary | ICD-10-CM | POA: Diagnosis present

## 2019-03-08 DIAGNOSIS — Z95811 Presence of heart assist device: Secondary | ICD-10-CM | POA: Diagnosis not present

## 2019-03-08 DIAGNOSIS — J95821 Acute postprocedural respiratory failure: Secondary | ICD-10-CM | POA: Diagnosis not present

## 2019-03-08 DIAGNOSIS — J9811 Atelectasis: Secondary | ICD-10-CM | POA: Diagnosis not present

## 2019-03-08 DIAGNOSIS — I5023 Acute on chronic systolic (congestive) heart failure: Secondary | ICD-10-CM | POA: Diagnosis present

## 2019-03-08 DIAGNOSIS — J939 Pneumothorax, unspecified: Secondary | ICD-10-CM | POA: Diagnosis not present

## 2019-03-08 DIAGNOSIS — E876 Hypokalemia: Secondary | ICD-10-CM | POA: Diagnosis not present

## 2019-03-08 DIAGNOSIS — E43 Unspecified severe protein-calorie malnutrition: Secondary | ICD-10-CM | POA: Diagnosis present

## 2019-03-08 DIAGNOSIS — R570 Cardiogenic shock: Secondary | ICD-10-CM | POA: Diagnosis present

## 2019-03-08 DIAGNOSIS — Z8249 Family history of ischemic heart disease and other diseases of the circulatory system: Secondary | ICD-10-CM | POA: Diagnosis not present

## 2019-03-08 DIAGNOSIS — J918 Pleural effusion in other conditions classified elsewhere: Secondary | ICD-10-CM | POA: Diagnosis not present

## 2019-03-08 DIAGNOSIS — D689 Coagulation defect, unspecified: Secondary | ICD-10-CM | POA: Diagnosis present

## 2019-03-08 DIAGNOSIS — F1010 Alcohol abuse, uncomplicated: Secondary | ICD-10-CM | POA: Diagnosis present

## 2019-03-08 DIAGNOSIS — K7460 Unspecified cirrhosis of liver: Secondary | ICD-10-CM | POA: Diagnosis present

## 2019-03-08 DIAGNOSIS — J189 Pneumonia, unspecified organism: Secondary | ICD-10-CM | POA: Diagnosis present

## 2019-03-08 DIAGNOSIS — I509 Heart failure, unspecified: Secondary | ICD-10-CM | POA: Diagnosis not present

## 2019-03-08 DIAGNOSIS — Z20822 Contact with and (suspected) exposure to covid-19: Secondary | ICD-10-CM | POA: Diagnosis present

## 2019-03-08 DIAGNOSIS — I420 Dilated cardiomyopathy: Secondary | ICD-10-CM | POA: Diagnosis present

## 2019-03-08 LAB — PROTIME-INR
INR: 2 — ABNORMAL HIGH (ref 0.8–1.2)
Prothrombin Time: 22.5 seconds — ABNORMAL HIGH (ref 11.4–15.2)

## 2019-03-08 LAB — BASIC METABOLIC PANEL
Anion gap: 10 (ref 5–15)
BUN: 7 mg/dL (ref 6–20)
CO2: 23 mmol/L (ref 22–32)
Calcium: 8.7 mg/dL — ABNORMAL LOW (ref 8.9–10.3)
Chloride: 96 mmol/L — ABNORMAL LOW (ref 98–111)
Creatinine, Ser: 0.6 mg/dL — ABNORMAL LOW (ref 0.61–1.24)
GFR calc Af Amer: >60 mL/min (ref >60)
GFR calc non Af Amer: >60 mL/min (ref >60)
Glucose, Bld: 111 mg/dL — ABNORMAL HIGH (ref 70–99)
Potassium: 3.4 mmol/L — ABNORMAL LOW (ref 3.5–5.1)
Sodium: 129 mmol/L — ABNORMAL LOW (ref 135–145)

## 2019-03-08 LAB — CBC
HCT: 27.6 % — ABNORMAL LOW (ref 39.0–52.0)
Hemoglobin: 8.9 g/dL — ABNORMAL LOW (ref 13.0–17.0)
MCH: 30.1 pg (ref 26.0–34.0)
MCHC: 32.2 g/dL (ref 30.0–36.0)
MCV: 93.2 fL (ref 80.0–100.0)
Platelets: 594 10*3/uL — ABNORMAL HIGH (ref 150–400)
RBC: 2.96 MIL/uL — ABNORMAL LOW (ref 4.22–5.81)
RDW: 14.3 % (ref 11.5–15.5)
WBC: 20.7 10*3/uL — ABNORMAL HIGH (ref 4.0–10.5)
nRBC: 0 % (ref 0.0–0.2)

## 2019-03-08 LAB — ECHOCARDIOGRAM LIMITED
Height: 68 in
Weight: 1767.21 oz

## 2019-03-08 LAB — COOXEMETRY PANEL
Carboxyhemoglobin: 1.6 % — ABNORMAL HIGH (ref 0.5–1.5)
Methemoglobin: 0.8 % (ref 0.0–1.5)
O2 Saturation: 61 %
Total hemoglobin: 15.6 g/dL (ref 12.0–16.0)

## 2019-03-08 LAB — MAGNESIUM: Magnesium: 1.8 mg/dL (ref 1.7–2.4)

## 2019-03-08 LAB — LACTATE DEHYDROGENASE: LDH: 351 U/L — ABNORMAL HIGH (ref 98–192)

## 2019-03-08 MED ORDER — POTASSIUM CHLORIDE 10 MEQ/50ML IV SOLN
10.0000 meq | INTRAVENOUS | Status: AC
  Administered 2019-03-08: 10 meq via INTRAVENOUS
  Filled 2019-03-08 (×3): qty 50

## 2019-03-08 MED ORDER — MAGNESIUM SULFATE 2 GM/50ML IV SOLN
2.0000 g | Freq: Once | INTRAVENOUS | Status: AC
  Administered 2019-03-08: 2 g via INTRAVENOUS
  Filled 2019-03-08: qty 50

## 2019-03-08 NOTE — Progress Notes (Signed)
HeartMate 3 Rounding Note Heartmate 3 Implanted 02-26-2019 Subjective:    34 yo with nonischemic  cardiomyopathy, mod RV dysfunction and inotrope dependent Intermacs 2 cardiogenic shock preop POD # 10   Hx ETOH, drugs, cirrhosis HM3 implant with inotrope supported RV dysfunction Operative bronchoscopy needed for secretion induced RUL collapse- finishing  Fortaz/Cefixime for residual infiltrate and postop rising WBC Coagulopathy related to liver disease, HFwith expected perio transfusion requirement  Ambulating well with PT Receiving antibiotics for probable aspiration pneumonia-vancomycin plus meropenem with white count improved to 20,000 MAP better with diuresis, pain control- prn apresoline Sinus tach 108 off amiodarone  He has been diuresing well - betterO2 sats Receiving as needed Lasix to maintain euvolemia  VAD parameters are satisfactory with very few PI events, flow now 3.8- 4 liters .  With milrinone weaned off cooximetry remained 60%. Patient had ramp echo study and pump speed kept at 5500 RPM for optimal flows-4.2 L/min   INR is now stable at 2.0 on Coumadin Epicardial pacing wires have been removed Family has received instruction on driveline dressing change and equipment education.  Patient progressing and will be transferred to stepdown unit when bed available.  LVAD INTERROGATION:  HeartMate III LVAD:  Flow 4.0 liters/min, speed 5500, power 4.8, PI 3.4.     Objective:    Vital Signs:   Temp:  [98.1 F (36.7 C)-99.9 F (37.7 C)] 99.9 F (37.7 C) (02/01 1524) Pulse Rate:  [42-117] 88 (02/01 1255) Resp:  [18-29] 20 (02/01 1255) BP: (76-116)/(49-90) 83/67 (02/01 1255) SpO2:  [77 %-99 %] 93 % (02/01 1255) Weight:  [50.1 kg] 50.1 kg (02/01 0500) Last BM Date: 03/06/19 Mean arterial Pressure 80-90  Intake/Output:   Intake/Output Summary (Last 24 hours) at 03/08/2019 1555 Last data filed at 03/08/2019 1256 Gross per 24 hour  Intake 702.56 ml  Output 1725 ml  Net  -1022.44 ml     Physical Exam: HEENT: normal Wounds: sternal incision and DL exit clean and dry General:  Extubated, neuro intact, c/o surgical pain  ENT : JVP normal Carotids; no bruits. No lymphadenopathy or thryomegaly appreciated. Cor: Mechanical heart sounds with LVAD hum present. Lungs: Decreased breath sounds left base Abdomen: soft, nontender, nondistended. No hepatosplenomegaly. No bruits or masses. Good bowel sounds. Extremities: no cyanosis, clubbing, rash, edema Neuro: alert & orientedx3, cranial nerves grossly intact. moves all 4 extremities w/o difficulty. Affect pleasant  Telemetry: nsr rate 100  Labs: Basic Metabolic Panel: Recent Labs  Lab 03/02/19 0427 03/02/19 1430 03/03/19 0434 03/03/19 0434 03/04/19 0312 03/04/19 0312 03/05/19 0343 03/05/19 0343 03/06/19 0500 03/07/19 0315 03/08/19 0357  NA 134*   < > 130*   < > 128*  --  130*  --  130* 131* 129*  K 2.6*   < > 3.4*   < > 4.1  --  4.7  --  4.0 4.0 3.4*  CL 97*   < > 94*   < > 95*  --  100  --  99 97* 96*  CO2 24   < > 27   < > 24  --  23  --  22 24 23   GLUCOSE 92   < > 123*   < > 135*  --  121*  --  150* 119* 111*  BUN 16   < > 12   < > 12  --  8  --  8 8 7   CREATININE 0.95   < > 0.68   < > 0.58*  --  0.52*  --  0.62 0.65 0.60*  CALCIUM 8.0*   < > 8.3*   < > 8.5*   < > 8.4*   < > 8.6* 8.6* 8.7*  MG 1.7  --  1.4*  --  1.8  --  1.9  --   --   --  1.8  PHOS 2.9  --  1.4*  --  1.9*  --  2.2*  --   --   --   --    < > = values in this interval not displayed.    Liver Function Tests: Recent Labs  Lab 03/02/19 0427 03/03/19 0434 03/04/19 0312 03/05/19 0343 03/06/19 0500  AST 37 32 31 33 30  ALT 23 21 23 23 22   ALKPHOS 66 66 70 65 64  BILITOT 2.4* 1.1 1.1 1.0 1.0  PROT 5.7* 5.8* 6.1* 6.3* 6.5  ALBUMIN 2.4* 2.3* 2.3* 2.4* 2.3*   No results for input(s): LIPASE, AMYLASE in the last 168 hours. No results for input(s): AMMONIA in the last 168 hours.  CBC: Recent Labs  Lab 03/02/19 0427  03/02/19 0505 03/03/19 0434 03/03/19 1547 03/04/19 0312 03/05/19 0343 03/06/19 0500 03/07/19 0315 03/08/19 0357  WBC 8.4   < > 13.5*   < > 15.5* 20.6* 24.5* 22.2* 20.7*  NEUTROABS 5.9  --  8.3*  --  9.8* 14.3*  --   --   --   HGB 4.8*   < > 7.6*   < > 9.7* 9.5* 9.7* 9.1* 8.9*  HCT 14.3*   < > 22.6*   < > 28.4* 28.5* 29.7* 27.8* 27.6*  MCV 91.7   < > 92.6   < > 89.9 91.6 93.1 92.1 93.2  PLT 137*   < > 233   < > 313 381 473* 514* 594*   < > = values in this interval not displayed.    INR: Recent Labs  Lab 03/04/19 0312 03/05/19 0343 03/06/19 0500 03/07/19 0315 03/08/19 0357  INR 1.5* 2.0* 2.0* 2.2* 2.0*    Other results:  EKG:   Imaging: DG Chest Port 1 View  Result Date: 03/07/2019 CLINICAL DATA:  Left ventricular assist device. EXAM: PORTABLE CHEST 1 VIEW COMPARISON:  03/06/2019 FINDINGS: Right arm PICC tip unchanged in the SVC above the right atrium. Left ventricular cyst device without radiographic change. Small amount of pleural density on the left has not increased. Associated volume loss of the left lower lung. Mild perihilar atelectasis and basilar atelectasis on the right appears similar. IMPRESSION: No change since yesterday. Small amount of pleural density on the left has not increased. Associated atelectasis in the left lower lung. Mild atelectasis on the right is stable. Electronically Signed   By: 03/08/2019 M.D.   On: 03/07/2019 06:33   ECHOCARDIOGRAM LIMITED  Result Date: 03/08/2019   ECHOCARDIOGRAM LIMITED REPORT   Patient Name:   Jon Martin Date of Exam: 03/08/2019 Medical Rec #:  05/06/2019       Height:       68.0 in Accession #:    [id number]      Weight:       110.4 lb Date of Birth:  24-Feb-1985        BSA:          1.59 m Patient Age:    33 years        BP:           76/63 mmHg Patient Gender: M  HR:           110 bpm. Exam Location:  Inpatient  Procedure: Limited Echo, Limited Color Doppler and Cardiac Doppler Indications:    LVAD 338250   History:        Patient has prior history of Echocardiogram examinations, most                 recent 02/26/2019. Polysubstance abuse.  Sonographer:    Jonelle Sidle Dance Referring Phys: [id number] DANIEL R [place]  1. Left ventricular ejection fraction, by visual estimation, is <20%. The left ventricle has severely decreased function.  2. The left ventricle demonstrates global hypokinesis.  3. Global right ventricle has severely reduced systolic function.The right ventricular size is mildly enlarged.  4. Left atrial size was mildly dilated.  5. There is left bowing of the interatrial septum, suggestive of elevated right atrial pressure.  6. Right atrial size was mildly dilated.  7. Mild mitral valve regurgitation.  8. Tricuspid valve regurgitation moderate.  9. Limited study to assess LVAD; severe global reduction in LV systolic function; LVAD noted LV apex; mild biatrial enlargement; mild RVE with severe RV dysfunction; mild AI; mild MR and moderate TR. FINDINGS  Left Ventricle: Left ventricular ejection fraction, by visual estimation, is <20%. The left ventricle has severely decreased function. The left ventricle demonstrates global hypokinesis. The left ventricular internal cavity size was the left ventricle is normal in size. Right Ventricle: The right ventricular size is mildly enlarged. Global RV systolic function is has severely reduced systolic function. Left Atrium: Left atrial size was mildly dilated. Right Atrium: Right atrial size was mildly dilated. Pericardium: Trivial pericardial effusion is present is seen.There is pleural effusion in the left lateral region. Mitral Valve: Mild mitral valve regurgitation. Tricuspid Valve: Tricuspid valve regurgitation moderate. Aortic Valve: Aortic valve regurgitation is mild. Aorta: The aortic root is normal in size and structure. Shunts: There is left bowing of the interatrial septum, suggestive of elevated right atrial pressure. Additional Comments: Limited  study to assess LVAD; severe global reduction in LV systolic function; LVAD noted LV apex; mild biatrial enlargement; mild RVE with severe RV dysfunction; mild AI; mild MR and moderate TR.  LEFT VENTRICLE          Normals PLAX 2D LVIDd:         4.68 cm  3.6 cm LVIDs:         4.30 cm  1.7 cm LV PW:         0.84 cm  1.4 cm LV IVS:        0.57 cm  1.3 cm LVOT diam:     1.90 cm  2.0 cm LV SV:         18 ml    79 ml LV SV Index:   11.91    45 ml/m2 LVOT Area:     2.84 cm 3.14 cm2  LEFT ATRIUM         Index LA diam:    3.55 cm 2.24 cm/m   AORTA                 Normals Ao Root diam: 3.20 cm 31 mm Ao Asc diam:  2.50 cm 31 mm TRICUSPID VALVE             Normals TR Peak grad:   19.6 mmHg TR Vmax:        229.00 cm/s 288 cm/s  SHUNTS Systemic Diam: 1.90 cm  Kirk Ruths MD Electronically signed by  Olga Millers MD Signature Date/Time: 03/08/2019/12:42:46 PM    Final      Medications:     Scheduled Medications: . aspirin EC  81 mg Oral Daily  . bisacodyl  10 mg Oral Daily   Or  . bisacodyl  10 mg Rectal Daily  . Chlorhexidine Gluconate Cloth  6 each Topical Daily  . chlorpheniramine-HYDROcodone  5 mL Oral Q12H  . digoxin  0.125 mg Oral Daily  . docusate sodium  200 mg Oral Daily  . furosemide  40 mg Oral Daily  . guaiFENesin  600 mg Oral BID  . lactose free nutrition  237 mL Oral TID WC  . mouth rinse  15 mL Mouth Rinse BID  . pantoprazole  40 mg Oral Daily  . sildenafil  20 mg Oral TID  . sodium chloride flush  10 mL Intravenous Q12H  . spironolactone  12.5 mg Oral Daily  . warfarin  5 mg Oral q1800  . Warfarin - Physician Dosing Inpatient   Does not apply q1800    Infusions: . sodium chloride 10 mL/hr at 03/08/19 0000  . meropenem (MERREM) IV 1 g (03/08/19 1255)  . vancomycin 1,000 mg (03/08/19 0844)    PRN Medications: sodium chloride, hydrALAZINE, ondansetron (ZOFRAN) IV, oxyCODONE, sodium chloride flush, sorbitol, traMADol   Assessment:  Postop coagulopathy- resolved Expected posop  blood loss anemia -hemoglobin stable 9.5 VAD parameters satisfactorywith flow 4.2 L Airway secretions/ RUL atelectasis- improved but with cough and rising WBC will continue iv antibiotics  Sternal incision, DL incision clean, dry Postop bilrubin 4.2 >> 5.4>> 5.0 >> 2.4,  >> 1.2 follow Postop elevated MAP- now improved   Plan/Discussion:     Milrinone weaned off with adequate mixed venous saturation Ambulating  with PT Heart healthy diet, pulmonary toilet with flutter valve and incentive spirometry   INR   2.0.  Continue Coumadin dosing per pharmacy. ASA to 81 mg  I reviewed the LVAD parameters from today, and compared the results to the patient's prior recorded data.  No programming changes were made.  The LVAD is functioning within specified parameters.  The patient performs LVAD self-test daily.  LVAD interrogation was negative for any significant power changes, alarms or PI events/speed drops.  LVAD equipment check completed and is in good working order.  Back-up equipment present.   LVAD education done on emergency procedures and precautions and reviewed exit site care.  Length of Stay: [address]  Kathlee Nations [place] III 03/08/2019, 3:55 PM

## 2019-03-08 NOTE — Progress Notes (Signed)
PT Cancellation Note  Patient Details Name: Jon Martin MRN: [id number] DOB: December 21, 1985   Cancelled Treatment:    Reason Eval/Treat Not Completed: Fatigue/lethargy limiting ability to participate Pt declines due to not being able to sleep, "every time I close my eyes, someone comes in here." Will follow.   [name] A Denea Cheaney 03/08/2019, 12:02 PM [place], PT, DPT Acute Rehabilitation Services Pager [phone] Office [phone]

## 2019-03-08 NOTE — Progress Notes (Signed)
Occupational Therapy Treatment Patient Details Name: Jon Martin MRN: [id number] DOB: 07-Sep-1985 Today's Date: 03/08/2019    History of present illness This 34 y.o. male admitted with acute/chronic systolic HF  with cardiogenic shock.  He underwent placement of HM 3 LVAD on 02/26/2019. Post op he developed RUL PNA, as well as acute blood loss anemia. CXR 1/25 w/ new moderate size right pneumothorax s/p CT placement. PMH includes: h/o poly substance abuse, dilated cardiomyopathy   OT comments  Pt sleeping upon arrival. Required encouragement for OOB activities, but at end of session he thanked therapists for working with him. Pt able to perform LB dressing with set up, UB dressing with min assist. He transferred power source from battery to wall with supervision. Pt with HR to 126 with ambulation in hall.   Follow Up Recommendations  No OT follow up;Supervision/Assistance - 24 hour    Equipment Recommendations  None recommended by OT    Recommendations for Other Services      Precautions / Restrictions Precautions Precautions: Sternal       Mobility Bed Mobility Overal bed mobility: Modified Independent             General bed mobility comments: HOB up  Transfers Overall transfer level: Needs assistance Equipment used: None Transfers: Sit to/from Stand Sit to Stand: Supervision         General transfer comment: Supervision for safety/line management.    Balance Overall balance assessment: Needs assistance   Sitting balance-Leahy Scale: Good       Standing balance-Leahy Scale: Good                             ADL either performed or assessed with clinical judgement   ADL Overall ADL's : Needs assistance/impaired                 Upper Body Dressing : Minimal assistance;Sitting Upper Body Dressing Details (indicate cue type and reason): LVAD vest, front opening gown Lower Body Dressing: Set up;Sitting/lateral leans Lower Body Dressing  Details (indicate cue type and reason): socks                     Vision   Additional Comments: Pt transferred power source from battery to wall power with supervision.   Perception     Praxis      Cognition Arousal/Alertness: Awake/alert Behavior During Therapy: WFL for tasks assessed/performed Overall Cognitive Status: Within Functional Limits for tasks assessed                                 General Comments: pt needing encouragement to participate        Exercises     Shoulder Instructions       General Comments      Pertinent Vitals/ Pain       Pain Assessment: No/denies pain  Home Living                                          Prior Functioning/Environment              Frequency  Min 2X/week        Progress Toward Goals  OT Goals(current goals can now be found in the care plan section)  Progress towards  OT goals: Progressing toward goals  Acute Rehab OT Goals Patient Stated Goal: to get better  OT Goal Formulation: With patient Time For Goal Achievement: 03/14/19 Potential to Achieve Goals: Good  Plan Discharge plan remains appropriate    Co-evaluation                 AM-PAC OT "6 Clicks" Daily Activity     Outcome Measure   Help from another person eating meals?: None Help from another person taking care of personal grooming?: A Little Help from another person toileting, which includes using toliet, bedpan, or urinal?: A Little Help from another person bathing (including washing, rinsing, drying)?: A Little Help from another person to put on and taking off regular upper body clothing?: A Little Help from another person to put on and taking off regular lower body clothing?: None 6 Click Score: 20    End of Session Equipment Utilized During Treatment: Oxygen  OT Visit Diagnosis: Unsteadiness on feet (R26.81);Pain   Activity Tolerance Patient tolerated treatment well   Patient Left in  bed;with call bell/phone within reach   Nurse Communication          Time: 1345-1410 OT Time Calculation (min): 25 min  Charges: OT General Charges $OT Visit: 1 Visit OT Treatments $Self Care/Home Management : 8-22 mins  [name], OTR/L Acute Rehabilitation Services Pager: [phone] Office: [phone]   [name] 03/08/2019, 2:53 PM

## 2019-03-08 NOTE — Progress Notes (Signed)
ANTICOAGULATION CONSULT NOTE - Initial Consult  Pharmacy Consult for Warfarin Indication: LVAD  No Known Allergies  Patient Measurements: Height: 5\' 8"  (172.7 cm) Weight: 110 lb 7.2 oz (50.1 kg) IBW/kg (Calculated) : 68.4  Vital Signs: Temp: 99.9 F (37.7 C) (02/01 1524) Temp Source: Oral (02/01 1524) BP: 83/67 (02/01 1255) Pulse Rate: 88 (02/01 1255)  Labs: Recent Labs    03/06/19 0500 03/06/19 0500 03/07/19 0315 03/08/19 0357  HGB 9.7*   < > 9.1* 8.9*  HCT 29.7*  --  27.8* 27.6*  PLT 473*  --  514* 594*  LABPROT 22.5*  --  24.0* 22.5*  INR 2.0*  --  2.2* 2.0*  CREATININE 0.62  --  0.65 0.60*   < > = values in this interval not displayed.    Estimated Creatinine Clearance: 93.1 mL/min (A) (by C-G formula based on SCr of 0.6 mg/dL (L)).   Medical History: Past Medical History:  Diagnosis Date  . Dilated cardiomyopathy (HCC)   . Dilated cardiomyopathy (HCC) 02/2019  . Polysubstance abuse [facility])     Assessment: 34 year old male with LVAD placed 1/22 on Warfarin therapy with goal INR 2.0 to 2.5. Patient has been on warfarin since 1/24. INR has been therapeutic for the last 4 days on an average of 5mg  daily. Current order is for warfarin 5mg  po daily at 1800 PM.   INR today is 2 - stable for last 4 days.  Hgb 8.9 - stable. Platelets elevated at 594.  No bleeding reported.  Per Dr. 2/24 Trigt's note today - LVAD is functioning within specified parameters.   Goal of Therapy:  INR goal 2.0 to 2.5 Monitor platelets by anticoagulation protocol: Yes   Plan:  Continue Warfarin 5mg  po daily at 1800 PM as ordered.  Monitor INR daily for now.  Monitor for any signs or symptoms of bleeding.   , PharmD, BCPS, BCCCP Clinical Pharmacist Please refer to [facility] for [facility] numbers 03/08/2019,4:05 PM

## 2019-03-08 NOTE — Telephone Encounter (Signed)
CSW contacted patient's mother by phone to provide support and follow up with family plan to return for bedside teaching. CSW gave brief update and importance of patient to review manual as well as family. Mom states she has been practicing donning the sterile gloves but needs to continue to practice. Mom states she will discuss with patient's sister about the feasibility of training class on Thursday at bedside. Mom states she will return call to CSW tomorrow with outcome. CSW provided supportive intervention and will continue to follow. Lasandra Beech, LCSW, CCSW-MCS [phone]

## 2019-03-08 NOTE — Progress Notes (Signed)
CSW met at bedside with patient who stated that he did not sleep well last night due to cough. CSW discussed with patient about the importance of following through with teaching and ambulating for recovery. CSW also discussed need to read manual book for LVAD before he goes home. Patient's lunch was still sitting at bedside and CSW encouarged patient to eat for recovery as well. Patient verbalizes understanding of follow up plan and states he will review book tonight. Patient appears tired today but agreeable to follow up with team members. CSW will continue to follow throughout implant hospitalization. [name], [place], [place]

## 2019-03-08 NOTE — Progress Notes (Signed)
Speed  Flow  PI  Power  LVIDD  AI  Aortic openings  MR  TR  Septum  RV   5500 4.2 3.1 4.1 4.8  0/5  mild- mod mild Slight right  hypokinetic   5600 4.4 2.4 4.2 4.6  0/5 Mild- mod mild Bow left hypokinetic  5400  4.0 3.2 3.9 4.9  0/5 Mild- mod mild Slight bow right hypokinetic                                          Ramp ECHO performed at bedside.  At completion of ramp study, patients primary and back up controller programmed:  Fixed speed: 5500 Low speed limit: 5200  Alyce Pagan RN VAD Coordinator  Office: [phone]  24/7 Pager: [phone]

## 2019-03-08 NOTE — Progress Notes (Addendum)
LVAD Coordinator Rounding Note:  Admitted 02/10/19 due to CHF from OSH .   HM3 LVAD implanted on 02/26/19 by PVT under DT criteria.  Pt napping in bed upon my arrival to his room. He reports he walked 2 full laps in the hall this morning. Pacing wires pulled this morning. Echo tech at bedside to perform RAMP echo. See separate note for documentation.   Reports that he has been eating well over the weekend and is trying to consume more protein.   Provided patient with abdominal binder today. Showed patient how to put on binder. Explained importance of wearing especially when he is sleeping to prevent drive line trauma. He verbalized understanding.   Vital signs: Temp: 99.1 HR: 101 Doppler Pressure: not done Automatic cuff:116/85 (94) O2 Sat: 95% on RA Wt: 106.4>116.4>128>112>115>115.5>110.4 lbs   LVAD interrogation reveals:  Speed: 5500 Flow: 4.3 Power: 4.0w PI: 3.0 Alarms: none Events:  0 PI events Hematocrit: 29  Fixed speed: 5500 Low speed limit: 5200   Drive Line: CDI. Anchor secure x 2. Daily dressing changes per VAD Coordinator or Nurse Davonna Belling. Existing VAD dressing removed and site care performed using sterile technique. Drive line exit site cleaned with Chlora prep applicators x 2, allowed to dry, andgauze dressing withsilver stripre-applied. Exit site with 2 sutures intact,the velour is fully implanted at exit site. No drainage, redness, tenderness, foul odor or rash noted. Drive line anchor intact x 2.      Labs:  LDH trend: 518>841>660>630>160>109>323>557>322  INR trend: 1.3>1.9>1.5>1.2>1.5>1.5>2.0>2.0  Anticoagulation Plan: -INR Goal: 2-2.5 -ASA Dose: 325 mg until INR theapeutic  Blood Products:  -Intra op: 2 u PRBCS 6 FFP 1 PLT  -Post-op: - 03/03/19>>1 unit PCs  Device: -N/A  Gtts: Milrinone 0.125 mcg/kg/min>>off Heparin 800 units/hr>>off  Infection: Urine cx 03/06/19>> NGTD Blood cx 03/06/19>> NGTD  Respiratory: Extubated  02/27/19 03/01/19>>40% BiPAP added   03/01/19>>CT placed for right pnuemothorax 03/02/19>>O2 per [place] 03/03/19>>RA  Arrhythmia:  - 02/28/19>>Amiodarone 30 mg/hr started for atrial tachycardia>>off  Nitric Oxide:  03/02/19>>off   VAD Education: Patient resistant to VAD education this afternoon. States that he wants to be left alone and allowed to sleep. Reiterated importance of education in preparation of him going home. He allowed me to do brief amount of teaching this afternoon but did not appear fully engaged.    1. Reviewed VAD equipment: batteries, clips, battery charger, wall power. Pt demonstrated self test and knowledge of use of display button to look at VAD numbers. He was able to verbalized "one power source at a time." Pt demonstrated knowledge of how to check battery life on both controller and battery. Discussed keeping all 8 batteries rotated and charged.  2. Briefly discussed red heart and yellow wrench alarms. 3. Discussed what to do if power outage at home. Pt verbalized understanding of back up battery running his pump for 15 minutes. Discussed keeping all 8 batteries rotated and charged in case of emergency.  4. Briefly discussed VAD pager and when to call.  5. Discussed importance of taking all medications as ordered. Will provide with pillbox at discharge.  6. Discussed importance of taking Coumadin daily in the evening. Pt reports he likes to eat greens. Showed list of fruits and vegetables high in Vit K in discharge binder. Discussed importance of consistency of eating habits if he chooses to eat greens so we are able to accurately dose his Coumadin. Discussed not consuming alcohol unless cleared by Dr Haroldine Laws.  7. Pt has not started reading "  HM III Patient Handbook" or discharge education binder, but will "try" and start today. Discussed with bedside RN need to reinforce reading of binder. Placed HM III Handbook on patient's bedside table within reach for him to read.  8.  Discussed daily log book and documenting daily weight, temperature, and VAD numbers. Provided with log book today.    Plan/Recommendations:  1. Page VAD coordinator w/any equipment issues or concerns or driveline management questions. 2. Daily dressing change per VAD coordinator or nurse champion.   Alyce Pagan RN VAD Coordinator  Office: [phone]  24/7 Pager: [phone]

## 2019-03-08 NOTE — Progress Notes (Addendum)
Patient ID: Jon Martin, male   DOB: 08/28/1985, 34 y.o.   MRN: [id number]     Advanced Heart Failure Rounding Note  PCP-Cardiologist: No primary care provider on file.   Subjective:    34 y.o. 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  Underwent HM-3 placement 02/26/2019.  Extubated 1/23. CXR 1/25 w/ new moderate size right pneumothorax s/p CT placement. CT removed 1/28    Now on meropenmum + vancomycin.  Has dense left basilar opacity.  WBC12>>15>>20>>24.5>>20.     Hgb improved after transfusion, received 1 unit PRBCs on 1/27. Hgb trending down 9.7>9.1>8.9     Denies SOB. Able to walk around the unit without difficulty.    LVAD Interrogation HM 3: Speed: 5500 Flow 4.3  PI: 3.1 Power: 4  Rare PI events/24 hrs. VAD interrogated personally. Parameters stable.  Objective:   Weight Range: 50.1 kg Body mass index is 16.79 kg/m.   Vital Signs:   Temp:  [98.1 F (36.7 C)-99.2 F (37.3 C)] 99.2 F (37.3 C) (02/01 0400) Pulse Rate:  [42-117] 100 (02/01 0500) Resp:  [18-29] 20 (02/01 0500) BP: (57-113)/(46-90) 104/77 (02/01 0500) SpO2:  [77 %-99 %] 93 % (02/01 0500) Weight:  [50.1 kg] 50.1 kg (02/01 0500) Last BM Date: 03/06/19  Weight change: Filed Weights   03/06/19 0600 03/07/19 0600 03/08/19 0500  Weight: 51.3 kg 49.7 kg 50.1 kg    Intake/Output:   Intake/Output Summary (Last 24 hours) at 03/08/2019 0708 Last data filed at 03/08/2019 2683 Gross per 24 hour  Intake 970.69 ml  Output 900 ml  Net 70.69 ml      Physical Exam  Maps 70-80s  CVP 8-9  Physical Exam: GENERAL: No acute distress. HEENT: normal  NECK: Supple, JVP 7-8   .  2+ bilaterally, no bruits.  No lymphadenopathy or thyromegaly appreciated.   CARDIAC:  Mechanical heart sounds with LVAD hum present.  LUNGS:  Clear to auscultation bilaterally.  ABDOMEN:  Soft, round, nontender, positive bowel sounds x4.     LVAD exit site:   Dressing dry and intact.  No erythema or  drainage.  Stabilization device present and accurately applied.  Driveline dressing is being changed daily per sterile technique. EXTREMITIES:  Warm and dry, no cyanosis, clubbing, rash or edema . RUE PICC NEUROLOGIC:  Alert and oriented x3.   No aphasia.  No dysarthria.  Affect pleasant.      Telemetry  Sinus Tach 110s  Labs    CBC Recent Labs    03/07/19 0315 03/08/19 0357  WBC 22.2* 20.7*  HGB 9.1* 8.9*  HCT 27.8* 27.6*  MCV 92.1 93.2  PLT 514* 419*   Basic Metabolic Panel Recent Labs    03/07/19 0315 03/08/19 0357  NA 131* 129*  K 4.0 3.4*  CL 97* 96*  CO2 24 23  GLUCOSE 119* 111*  BUN 8 7  CREATININE 0.65 0.60*  CALCIUM 8.6* 8.7*   Liver Function Tests Recent Labs    03/06/19 0500  AST 30  ALT 22  ALKPHOS 64  BILITOT 1.0  PROT 6.5  ALBUMIN 2.3*   No results for input(s): LIPASE, AMYLASE in the last 72 hours. Cardiac Enzymes No results for input(s): CKTOTAL, CKMB, CKMBINDEX, TROPONINI in the last 72 hours.  BNP: BNP (last 3 results) Recent Labs    02/10/19 0219 02/27/19 0433 03/05/19 0056  BNP 2,048.6* 447.0* 427.2*    ProBNP (last 3 results) No results for input(s): PROBNP in the last  8760 hours.   D-Dimer No results for input(s): DDIMER in the last 72 hours. Hemoglobin A1C No results for input(s): HGBA1C in the last 72 hours. Fasting Lipid Panel No results for input(s): CHOL, HDL, LDLCALC, TRIG, CHOLHDL, LDLDIRECT in the last 72 hours. Thyroid Function Tests No results for input(s): TSH, T4TOTAL, T3FREE, THYROIDAB in the last 72 hours.  Invalid input(s): FREET3  Other results:   Imaging    No results found.   Medications:     Scheduled Medications: . aspirin EC  81 mg Oral Daily  . bisacodyl  10 mg Oral Daily   Or  . bisacodyl  10 mg Rectal Daily  . Chlorhexidine Gluconate Cloth  6 each Topical Daily  . chlorpheniramine-HYDROcodone  5 mL Oral Q12H  . digoxin  0.125 mg Oral Daily  . docusate sodium  200 mg Oral Daily    . furosemide  40 mg Oral Daily  . guaiFENesin  600 mg Oral BID  . lactose free nutrition  237 mL Oral TID WC  . mouth rinse  15 mL Mouth Rinse BID  . pantoprazole  40 mg Oral Daily  . sildenafil  20 mg Oral TID  . sodium chloride flush  10 mL Intravenous Q12H  . spironolactone  12.5 mg Oral Daily  . warfarin  5 mg Oral q1800  . Warfarin - Physician Dosing Inpatient   Does not apply q1800    Infusions: . sodium chloride 10 mL/hr at 03/08/19 0000  . meropenem (MERREM) IV 1 g (03/08/19 0347)  . potassium chloride 10 mEq (03/08/19 0625)  . vancomycin Stopped (03/07/19 2250)    PRN Medications: sodium chloride, hydrALAZINE, ondansetron (ZOFRAN) IV, oxyCODONE, sodium chloride flush, sorbitol, traMADol     Assessment/Plan   1. Acute/Chronic Systolic HF -> cardiogenic shock - ECHO at [facility] EF, 20%.  - ECHO repeated and showed severely reduced EF <20%. RV only mildly down - R/L cath 1/7 normal cors. Output ok   - s/p HM-3 on 02/26/2019 -- Plan Ramp Echo today.   - CO-OX 61% off milrinone. Continue digoxin 0.125 mg daily, dig level 0.2 on 03/06/19 - Volume status stable. CVP 8-9. Continue lasix 40 mg daily.  - Continue 12.5 mg spiro daily.   - VAD interrogated personally. Parameters stable.    2. VAD -  s/p HM-3 on 02/26/2019 -  RV down some.  - Stable off milrinone. CO-OX 61%.  -  VAD interrogated personally. Parameters stable. - Continue sildenafil 20 mg three times a day.  - LDH stable 351 - Continue warfarin + ASA 81 - INR therapeutic 2.0 . INR Goal 2-2.5  3. Acute post-op respiratory failure with RUL PNA - Extubated 1/23.  - He is on cefepime for possible LLL PNA, Tm 101.4. WBCs up to 24.5.  Will reculture today and broaden abx to include vancomycin.   4. Acute blood loss anemia - keep HGB > 8.0 - s/p transfusion x 1 unit 1/27 for hgb of 7.6.  - Hgb has been trending down 9.7>9.1>8.9 - No obvious source.  - CBC in am.   5. Elevated bilirubin - Resolved.   6.  Moderate size right pneumothorax: new on CXR 1/25.  - s/p placement for Rt sided CT. F/u CXR showed complete resolution of apical pneumothorax.  - management per CT surgery   7. Hypokalemia/ Hypomangesemia: - Keep K >4.0 and Mg > 2.0   - Supp K.   8. Deconditioning: - continue to ambulate w/ PT.  - May  need HHPT   Ramp ECHO today. Should be able transfer to progressive .    Length of Stay: 75  Amy Clegg, NP  03/08/2019, 7:08 AM  Advanced Heart Failure Team Pager [phone] (M-F; 7a - 4p)  Please contact [facility] for night-coverage after hours (4p -7a ) and weekends on [email or web address]  Patient seen and examined with the above-signed Advanced Practice Provider and/or Housestaff. I personally reviewed laboratory data, imaging studies and relevant notes. I independently examined the patient and formulated the important aspects of the plan. I have edited the note to reflect any of my changes or salient points. I have personally discussed the plan with the patient and/or family.  Looks better today but still with cough.  Now off  Milrinone. Co-ox 61% CVP 8-9. On po lasix. On digoxin and sildenafil. + spiro  On broad spectrum abx for PNA. cx negative. WBC finally coming down. AF.    VAD interrogated personally. Parameters stable.  General:  Lying in bed NAD.  + cough HEENT: normal  Neck: supple. JVP 8-9.  Carotids 2+ bilat; no bruits. No lymphadenopathy or thryomegaly appreciated. Cor: LVAD hum.  Lungs: Clear. Abdomen: soft, nontender, non-distended. No hepatosplenomegaly. No bruits or masses. Good bowel sounds. Driveline site clean. Anchor in place.  Extremities: no cyanosis, clubbing, rash. Warm no edema  Neuro: alert & oriented x 3. No focal deficits. Moves all 4 without problem    Can go to [facility] today. Ramp echo later today. Continue broad spectrum bx for at least a nother few days. INR 2.0. Discussed dosing with PharmD personally.  Arvilla Meres, MD  8:38 AM

## 2019-03-08 NOTE — Progress Notes (Signed)
VAD Discharge Teaching Note:  Discharge VAD teaching initiated with patient, mother, and sister.  The home inspection checklist has been reviewed and no unsafe conditions have been identified. Family reports that there are at least two dedicated grounded, 3-prong outlets that are not controlled by power switch that patient will be using for home equipment.   Both patient and caregivers have been trained on the following:  1. HM II LVAD overview of system operations  2. Overview of major lifestyle accommodations and cautions   3. Overview of system components (features and functions) 4. Changing power sources   The patient and caregivers have successfully demonstrated:  1. Changing power source (from batteries to mobile power unit, mobile power unit to batteries, and replacing batteries) 2. Perform system controller self test  3. Check and charge batteries    The following routine activities and maintenance have been reviewed with patient and caregivers and both verbalize understanding:  1. Stressed importance of never disconnecting power from both controller power leads at the same time, and never disconnecting both batteries at the same time, or the controller will alarm. 2. Plug the mobile power unit (MPU) and the universal battery charger (UBC) into properly grounded (3 prong) outlets dedicated to MPU use.3. Do not connect the PM or MPU to an outlet controlled by wall switch or the device may not work 4. Transfer from MPU to batteries during [facility] mains power failure.  5. Keep a backup system controller, charged batteries, battery clips, and flashlight near you during sleep in case of electrical power outage 6. Clean battery, battery clip, and universal battery charger contacts weekly 7. Visually inspect percutaneous lead daily 8. Check cables and connectors when changing power source  9. Rotate batteries; keep all eight batteries charged 10. Always have backup system controller,  battery clips, fully charged batteries, and spare fully charged batteries when traveling.  Identified the following changes in activities of daily living with pump: 1. No driving for at least six weeks and then only if doctor gives permission to do so - pt does not drive. 2. No tub baths while pump implanted, and shower only if doctor gives permission 3. No swimming or submersion in water while implanted with pump 4. Keep all VAD equipment away from water or moisture 5. Keep all VAD connections clean and dry 6. Plan to sleep only when connected to the power module. 7. Keep a backup system controller, charged batteries, battery clips, and flashlight near you during sleep in case of electrical power outage  Discharge binder given to patient and include the following: 1. List of emergency contacts 2. Wallet card 3. HM III Luggage tags 4. HM III Alarms for Patients and their Caregivers 5. HM III Patient Handbook 6. HM III Patient Education Program DVD 7. Warfarin teaching sheets 8. Nosebleed teaching sheets 9. Medications you may and may not take with CHF list   Training initiated with mother and sister today on percutaneous lead exit site care, care of the driveline and dressing changes. The importance of lead immobilization has been stressed to patient and caregivers using the attachment device. Both observed dressing change as noted below. Demonstrated donning sterile gloves and maintaining sterile field. Both sent home with extra pairs of sterile gloves to practice.   Drive Line: Existing VAD dressing removed and site care performed using sterile technique. Drive line exit site cleaned with Chlora prep applicators x 2, allowed to dry, andgauze dressing withsilver stripre-applied. Exit site with 2 sutures intact,the velour  is fully implanted at exit site. Moderate amount of bloody drainage at exit site and on gauze.Slight redness around exit site with notenderness, foul odor or rash  noted. Drive line anchor re-applied and re-positioned.     Mother took HM III Patient Handbook home to complete reading before next discharge teaching session; sister did not want one. Spoke with mom about next training date, she voiced it will take arranging time off and a ride to hospital. Will contact her beginning of week after speaking with Dr. Gala Romney about possible discharge date and completion of dc teaching. She verbalized understanding of same.   [name], RN VAD Coordinator  Office: [phone] 24/7 VAD Pager: [phone]

## 2019-03-08 NOTE — Progress Notes (Signed)
Physical Therapy Treatment Patient Details Name: Jon Martin MRN: [id number] DOB: 02/03/1986 Today's Date: 03/08/2019    History of Present Illness This 34 y.o. male admitted with acute/chronic systolic HF  with cardiogenic shock.  He underwent placement of HM 3 LVAD on 02/26/2019. Post op he developed RUL PNA, as well as acute blood loss anemia. CXR 1/25 w/ new moderate size right pneumothorax s/p CT placement. PMH includes: h/o poly substance abuse, dilated cardiomyopathy    PT Comments    Patient progressing well towards PT goals. Requires encouragement and coaxing to participate in therapy today. Improved ambulation distance from prior session to 740' without DME. Mild drifting noted but no LOB. HR up to 126 bpm. Pt able to transfer power source from battery to wall with supervision and setup. Reviewed items in black bag. Will plan for stair training in the following sessions. Will continue to follow.   Follow Up Recommendations  Home health PT;Supervision for mobility/OOB     Equipment Recommendations  None recommended by PT    Recommendations for Other Services       Precautions / Restrictions Precautions Precautions: Sternal Precaution Comments: LVAD Restrictions Weight Bearing Restrictions: Yes Other Position/Activity Restrictions: sternal precautions    Mobility  Bed Mobility Overal bed mobility: Modified Independent Bed Mobility: Supine to Sit;Sit to Supine     Supine to sit: Modified independent (Device/Increase time);HOB elevated Sit to supine: Modified independent (Device/Increase time);HOB elevated   General bed mobility comments: HOB up, no use of UEs. No assist needed.  Transfers Overall transfer level: Needs assistance Equipment used: None Transfers: Sit to/from Stand Sit to Stand: Supervision         General transfer comment: Supervision for safety/line management.  Ambulation/Gait Ambulation/Gait assistance: Supervision Gait Distance (Feet):  740 Feet Assistive device: None Gait Pattern/deviations: Step-through pattern;Decreased stride length;Drifts right/left Gait velocity: good speed Gait velocity interpretation: >2.62 ft/sec, indicative of community ambulatory General Gait Details: Steady gait with no DME. HR up to 126 bpm. Drifts at times but no LOB.   Stairs             Wheelchair Mobility    Modified Rankin (Stroke Patients Only)       Balance Overall balance assessment: Needs assistance Sitting-balance support: Feet supported;No upper extremity supported Sitting balance-Leahy Scale: Good Sitting balance - Comments: Transferred power source from battery to wall with supervision   Standing balance support: During functional activity Standing balance-Leahy Scale: Good                              Cognition Arousal/Alertness: Awake/alert Behavior During Therapy: WFL for tasks assessed/performed Overall Cognitive Status: Within Functional Limits for tasks assessed                                 General Comments: pt needing encouragement to participate      Exercises      General Comments General comments (skin integrity, edema, etc.): HR up to 126 bpm.      Pertinent Vitals/Pain Pain Assessment: No/denies pain    Home Living                      Prior Function            PT Goals (current goals can now be found in the care plan section) Acute Rehab PT Goals Patient Stated  Goal: to get better  Progress towards PT goals: Progressing toward goals    Frequency    Min 3X/week      PT Plan Current plan remains appropriate    Co-evaluation              AM-PAC PT "6 Clicks" Mobility   Outcome Measure  Help needed turning from your back to your side while in a flat bed without using bedrails?: None Help needed moving from lying on your back to sitting on the side of a flat bed without using bedrails?: None Help needed moving to and from a  bed to a chair (including a wheelchair)?: A Little Help needed standing up from a chair using your arms (e.g., wheelchair or bedside chair)?: A Little Help needed to walk in hospital room?: A Little Help needed climbing 3-5 steps with a railing? : A Little 6 Click Score: 20    End of Session   Activity Tolerance: Patient tolerated treatment well Patient left: in bed;with call bell/phone within reach Nurse Communication: Mobility status PT Visit Diagnosis: Other abnormalities of gait and mobility (R26.89)     Time: 1345-1414 PT Time Calculation (min) (ACUTE ONLY): 29 min  Charges:  $Therapeutic Exercise: 8-22 mins                     Marisa Severin, PT, DPT Acute Rehabilitation Services Pager [phone] Office [place] 03/08/2019, 3:26 PM

## 2019-03-08 NOTE — Progress Notes (Signed)
  Echocardiogram 2D Echocardiogram has been performed.  Jon Martin 03/08/2019, 9:54 AM

## 2019-03-09 ENCOUNTER — Inpatient Hospital Stay (HOSPITAL_COMMUNITY): Payer: Medicaid - Out of State

## 2019-03-09 LAB — BASIC METABOLIC PANEL
Anion gap: 10 (ref 5–15)
Anion gap: 10 (ref 5–15)
BUN: 9 mg/dL (ref 6–20)
BUN: 6 mg/dL (ref 6–20)
CO2: 23 mmol/L (ref 22–32)
CO2: 25 mmol/L (ref 22–32)
Calcium: 8.3 mg/dL — ABNORMAL LOW (ref 8.9–10.3)
Calcium: 8.9 mg/dL (ref 8.9–10.3)
Chloride: 96 mmol/L — ABNORMAL LOW (ref 98–111)
Chloride: 97 mmol/L — ABNORMAL LOW (ref 98–111)
Creatinine, Ser: 0.57 mg/dL — ABNORMAL LOW (ref 0.61–1.24)
Creatinine, Ser: 0.64 mg/dL (ref 0.61–1.24)
GFR calc Af Amer: >60 mL/min (ref >60)
GFR calc Af Amer: >60 mL/min (ref >60)
GFR calc non Af Amer: >60 mL/min (ref >60)
GFR calc non Af Amer: >60 mL/min (ref >60)
Glucose, Bld: 131 mg/dL — ABNORMAL HIGH (ref 70–99)
Glucose, Bld: 126 mg/dL — ABNORMAL HIGH (ref 70–99)
Potassium: 2.9 mmol/L — ABNORMAL LOW (ref 3.5–5.1)
Potassium: 3.9 mmol/L (ref 3.5–5.1)
Sodium: 129 mmol/L — ABNORMAL LOW (ref 135–145)
Sodium: 132 mmol/L — ABNORMAL LOW (ref 135–145)

## 2019-03-09 LAB — CBC
HCT: 26.7 % — ABNORMAL LOW (ref 39.0–52.0)
Hemoglobin: 8.7 g/dL — ABNORMAL LOW (ref 13.0–17.0)
MCH: 30.3 pg (ref 26.0–34.0)
MCHC: 32.6 g/dL (ref 30.0–36.0)
MCV: 93 fL (ref 80.0–100.0)
Platelets: 627 10*3/uL — ABNORMAL HIGH (ref 150–400)
RBC: 2.87 MIL/uL — ABNORMAL LOW (ref 4.22–5.81)
RDW: 14.2 % (ref 11.5–15.5)
WBC: 22.4 10*3/uL — ABNORMAL HIGH (ref 4.0–10.5)
nRBC: 0 % (ref 0.0–0.2)

## 2019-03-09 LAB — LACTATE DEHYDROGENASE: LDH: 322 U/L — ABNORMAL HIGH (ref 98–192)

## 2019-03-09 LAB — PROTIME-INR
INR: 2.6 — ABNORMAL HIGH (ref 0.8–1.2)
Prothrombin Time: 28.1 seconds — ABNORMAL HIGH (ref 11.4–15.2)

## 2019-03-09 LAB — COOXEMETRY PANEL
Carboxyhemoglobin: 1.7 % — ABNORMAL HIGH (ref 0.5–1.5)
Methemoglobin: 1.1 % (ref 0.0–1.5)
O2 Saturation: 59 %
Total hemoglobin: 8.9 g/dL — ABNORMAL LOW (ref 12.0–16.0)

## 2019-03-09 MED ORDER — BOOST PLUS PO LIQD
237.0000 mL | Freq: Three times a day (TID) | ORAL | Status: DC
  Administered 2019-03-09 – 2019-03-15 (×22): 237 mL via ORAL
  Filled 2019-03-09 (×26): qty 237

## 2019-03-09 MED ORDER — POTASSIUM CHLORIDE CRYS ER 20 MEQ PO TBCR
30.0000 meq | EXTENDED_RELEASE_TABLET | Freq: Two times a day (BID) | ORAL | Status: AC
  Administered 2019-03-09 (×2): 30 meq via ORAL
  Filled 2019-03-09 (×2): qty 1

## 2019-03-09 MED ORDER — POTASSIUM CHLORIDE CRYS ER 20 MEQ PO TBCR
20.0000 meq | EXTENDED_RELEASE_TABLET | ORAL | Status: AC
  Administered 2019-03-09 (×3): 20 meq via ORAL
  Filled 2019-03-09 (×2): qty 1
  Filled 2019-03-09: qty 2

## 2019-03-09 MED ORDER — FUROSEMIDE 10 MG/ML IJ SOLN
40.0000 mg | Freq: Two times a day (BID) | INTRAMUSCULAR | Status: AC
  Administered 2019-03-09 (×2): 40 mg via INTRAVENOUS
  Filled 2019-03-09 (×2): qty 4

## 2019-03-09 NOTE — Plan of Care (Signed)

## 2019-03-09 NOTE — Progress Notes (Signed)
LVAD Coordinator Rounding Note:  Admitted 02/10/19 due to CHF from OSH .   HM3 LVAD implanted on 02/26/19 by PVT under DT criteria.  Pt napping in bed upon my arrival to his room. Patient is wearing abdominal binder today.   Vital signs: Temp: 99.4 HR: 113 Doppler Pressure: 68 Automatic cuff: 85/45 (59) O2 Sat: 97% on RA Wt: 106.4>116.4>128>112>115>115.5>110.4 lbs   LVAD interrogation reveals:  Speed: 5500 Flow: 4.3 Power: 4.0w PI: 3.4 Alarms: none Events:  2 PI events Hematocrit: 26  Fixed speed: 5500 Low speed limit: 5200   Drive Line: CDI. Anchor secure x 2. Daily dressing changes per VAD Coordinator or Nurse Alla Feeling. Existing VAD dressing removed and site care performed using sterile technique. Drive line exit site cleaned with Chlora prep applicators x 2, allowed to dry, andgauze dressing withsilver stripre-applied. Distal stitch removed today. Exit site with Proximal suture intact,the velour is fully implanted at exit site. No drainage, redness, tenderness, foul odor or rash noted. Drive line anchor intact x 2. May advance to every other day dressing changes. Counter incision CDI.        Labs:  LDH trend: 243>418>431>412>373>352>355>379>351>322  INR trend: 1.3>1.9>1.5>1.2>1.5>1.5>2.0>2.0>2.6  Wbc: 20.7>22.4  Anticoagulation Plan: -INR Goal: 2-2.5 -ASA Dose: 81 mg  Blood Products:  -Intra op: 2 u PRBCS 6 FFP 1 PLT  -Post-op: - 03/03/19>>1 unit PCs  Device: -N/A  Gtts: Milrinone 0.125 mcg/kg/min>>off Heparin 800 units/hr>>off  Infection: Urine cx 03/06/19>> No growth Blood cx 03/06/19>> NGTD  Respiratory: Extubated 02/27/19 03/01/19>>40% BiPAP added   03/01/19>>CT placed for right pnuemothorax 03/02/19>>O2 per [place] 03/03/19>>RA  Arrhythmia:  - 02/28/19>>Amiodarone 30 mg/hr started for atrial tachycardia>>off  Nitric Oxide:  03/02/19>>off   VAD Education: Patient resistant to VAD education this afternoon. States that he wants to be left  alone and allowed to sleep. Reiterated importance of education in preparation of him going home. He allowed me to do brief amount of teaching this afternoon but did not appear fully engaged.    1. Reviewed VAD equipment: batteries, clips, battery charger, wall power. Pt demonstrated self test and knowledge of use of display button to look at VAD numbers. He was able to verbalized "one power source at a time." Pt demonstrated knowledge of how to check battery life on both controller and battery. Discussed keeping all 8 batteries rotated and charged.  2. Briefly discussed red heart and yellow wrench alarms. 3. Discussed what to do if power outage at home. Pt verbalized understanding of back up battery running his pump for 15 minutes. Discussed keeping all 8 batteries rotated and charged in case of emergency.  4. Briefly discussed VAD pager and when to call.  5. Discussed importance of taking all medications as ordered. Will provide with pillbox at discharge.  6. Discussed importance of taking Coumadin daily in the evening. Pt reports he likes to eat greens. Showed list of fruits and vegetables high in Vit K in discharge binder. Discussed importance of consistency of eating habits if he chooses to eat greens so we are able to accurately dose his Coumadin. Discussed not consuming alcohol unless cleared by Dr Gala Romney.  7. Pt has not started reading "HM III Patient Handbook" or discharge education binder, but will "try" and start today. Discussed with bedside RN need to reinforce reading of binder. Placed HM III Handbook on patient's bedside table within reach for him to read.  8. Discussed daily log book and documenting daily weight, temperature, and VAD numbers. Provided with log book today.  Plan/Recommendations:  1. Page VAD coordinator w/any equipment issues or concerns or driveline management questions. 2. Every other day dressing change per VAD coordinator or nurse champion.   Tanda Rockers  RN [place] Coordinator  Office: [phone]  24/7 Pager: [phone]

## 2019-03-09 NOTE — Progress Notes (Signed)
ANTICOAGULATION CONSULT NOTE  Pharmacy Consult for Warfarin Indication: LVAD  No Known Allergies  Patient Measurements: Height: 5\' 8"  (172.7 cm) Weight: 110 lb 7.2 oz (50.1 kg) IBW/kg (Calculated) : 68.4  Vital Signs: Temp: 99.4 F (37.4 C) (02/02 0803) Temp Source: Oral (02/02 0803) BP: 90/65 (02/02 0800) Pulse Rate: 107 (02/02 0800)  Labs: Recent Labs    03/07/19 0315 03/07/19 0315 03/08/19 0357 03/09/19 0210  HGB 9.1*   < > 8.9* 8.7*  HCT 27.8*  --  27.6* 26.7*  PLT 514*  --  594* 627*  LABPROT 24.0*  --  22.5* 28.1*  INR 2.2*  --  2.0* 2.6*  CREATININE 0.65  --  0.60* 0.57*   < > = values in this interval not displayed.    Estimated Creatinine Clearance: 93.1 mL/min (A) (by C-G formula based on SCr of 0.57 mg/dL (L)).   Medical History: Past Medical History:  Diagnosis Date  . Dilated cardiomyopathy (HCC)   . Dilated cardiomyopathy (HCC) 02/2019  . Polysubstance abuse [facility])     Assessment: 34 year old male with LVAD placed 1/22 on Warfarin therapy with goal INR 2.0 to 2.5. Patient has been on warfarin since 1/24. INR has been therapeutic for the last 4 days on an average of 5mg  daily. Current order is for warfarin 5mg  po daily at 1800 PM.   INR tonight increased to 2.6 from 2. Hgb 8.7, plt 627. LDH 322. No s/sx of bleeding. Plan for thoracentesis - discussed with Dr. 2/24 Trigt's note to hold warfarin.    Goal of Therapy:  INR goal 2.0 to 2.5 Monitor platelets by anticoagulation protocol: Yes   Plan:  Hold warfarin  Monitor INR daily for now.  Monitor for any signs or symptoms of bleeding.   , PharmD, BCCCP Clinical Pharmacist  Phone: [phone]  Please check AMION for all [facility] Pharmacy phone numbers After 10:00 PM, call Main Pharmacy [phone] 03/09/2019,8:39 AM

## 2019-03-09 NOTE — Progress Notes (Signed)
 Nutrition Follow-up  DOCUMENTATION CODES:   Underweight, Severe malnutrition in context of chronic illness  INTERVENTION:   Increase Boost Plus to QID  Add Magic cup BID with meals, each supplement provides 290 kcal and 9 grams of protein  Continue MVI with Minerals  NUTRITION DIAGNOSIS:   Severe Malnutrition related to chronic illness(CHF) as evidenced by severe fat depletion, severe muscle depletion.  Being addressed via supplements  GOAL:   Patient will meet greater than or equal to 90% of their needs  Progressing  MONITOR:   PO intake, Supplement acceptance, Labs, Weight trends, I & O's  REASON FOR ASSESSMENT:   Consult LVAD Eval  ASSESSMENT:   34 year old male with PMH of NIDCM, EF 20%, LifeVest, EtOH abuse, drug abuse. Pt transferred from [facility] for consideration for LVAD.  1/22 HeartMate III LVAD placed 1/23 Extubated 1/25 Bipap, Chest tube placed for postop pneumothorax  Recorded po intake 15-100%; recorded po intake 50% on average. Appetite not as great as it was. Pt likes the Boost plus shakes and per RN had had 2 so far today. Per MAR, there were several days recently where pt did not drink any.  Reinforced importance of adequate nutrition and taking oral nutrition supplements in addition to eating some during meal times. Plan to add Magic Cup BID as well  Current wt 50.1 kg; weight has been reliatlvey stable recently. Between 50-52 kg  Labs: sodium 129 (L), potassium 2.9 (L) Meds: lasix, KCL  Diet Order:   Diet Order            Diet Heart Room service appropriate? Yes; Fluid consistency: Thin  Diet effective now              EDUCATION NEEDS:   Education needs have been addressed  Skin:  Skin Assessment: Reviewed RN Assessment  Last BM:  1/21  Height:   Ht Readings from Last 1 Encounters:  02/10/19 5\' 8"  (1.727 m)    Weight:   Wt Readings from Last 1 Encounters:  03/08/19 50.1 kg    Ideal Body Weight:  70 kg  BMI:   Body mass index is 16.79 kg/m.  Estimated Nutritional Needs:   Kcal:  2100-2300  Protein:  95-110 grams  Fluid:  >/= 1.8 L    Janeva Peaster MS, RDN, LDN, CNSC [phone] Pager  [phone] Weekend/On-Call Pager

## 2019-03-09 NOTE — Progress Notes (Signed)
Pharmacy Antibiotic Note  Jon Martin is a 34 y.o. male admitted on 02/09/2019 s/p LVAD. Post-op abx was broaded to ceftaz for possible pna. Coverage was broadened to cefepime d/t increasing WBC and increased cough. Pharmacy has been consulted for meropenem and vancomycin dosing.  Now afebrile. Scr 0.57. WBC now at 22.4. Plan for thoracentesis today.   Plan: Continue vancomycin 1 gm IV every 12 hours >> will order level for 2/3 AM  Continue Meropenem 1 gm IV q8h Monitor clinical improvement, renal function, and abx narrowing when appropriate  Height: 5\' 8"  (172.7 cm) Weight: 110 lb 7.2 oz (50.1 kg) IBW/kg (Calculated) : 68.4  Temp (24hrs), Avg:99.5 F (37.5 C), Min:98.9 F (37.2 C), Max:99.9 F (37.7 C)  Recent Labs  Lab 03/05/19 0343 03/06/19 0500 03/07/19 0315 03/08/19 0357 03/09/19 0210  WBC 20.6* 24.5* 22.2* 20.7* 22.4*  CREATININE 0.52* 0.62 0.65 0.60* 0.57*    Estimated Creatinine Clearance: 93.1 mL/min (A) (by C-G formula based on SCr of 0.57 mg/dL (L)).    Ceftaz 1/24 > 1/28 Cefepime 1/28 >>1/29 Cefuroxime 1/22>1/24 Vancomycin 1/22 >1/23 Meropenem 1/29 >   1/15 BCx x 2: neg 1/9 Resp Cx: rare budding yeast  3/9, PharmD, BCCCP Clinical Pharmacist  Phone: [phone]  Please check AMION for all [facility] Pharmacy phone numbers After 10:00 PM, call Main Pharmacy [phone]  03/09/2019 9:09 AM

## 2019-03-09 NOTE — Progress Notes (Signed)
HeartMate 3 Rounding Note Heartmate 3 Implanted 02-26-2019 Subjective:    34 yo with nonischemic  cardiomyopathy, mod RV dysfunction and inotrope dependent Intermacs 2 cardiogenic shock preop POD # 11   Hx ETOH, drugs, cirrhosis HM3 implant with inotrope supported RV dysfunction Operative bronchoscopy needed for secretion induced RUL collapse- finishing  Fortaz/Cefixime for residual infiltrate and postop rising WBC CXR shows enlarging L pleural effusion, will hold coumadin and get U/S guided thoracentesis by IR when INR < 2.2 Coagulopathy related to liver disease, HFwith expected perio transfusion requirement  Ambulating well with PT Receiving antibiotics for probable aspiration pneumonia-vancomycin plus meropenem with white count improved but still elevated. Urine culture negative MAP better with diuresis, pain control- prn apresoline Sinus tach 108 off amiodarone   Receiving as needed Lasix to maintain euvolemia  VAD parameters are satisfactory with very few PI events, flow now 4.2 liters .  With milrinone weaned off cooximetry remained 60%. Patient had ramp echo study and pump speed kept at 5500 RPM for optimal flows-4.2 L/min   INR is now 2.6  Epicardial pacing wires have been removed Family has received instruction on driveline dressing change and equipment education.  Patient progressing and will be transferred to stepdown unit when bed available.  LVAD INTERROGATION:  HeartMate III LVAD:  Flow 4.0 liters/min, speed 5500, power 4.8, PI 3.4.     Objective:    Vital Signs:   Temp:  [98.9 F (37.2 C)-99.9 F (37.7 C)] 99.4 F (37.4 C) (02/02 0803) Pulse Rate:  [25-116] 113 (02/02 1003) Resp:  [18-37] 18 (02/02 0800) BP: (64-127)/(47-110) 90/65 (02/02 0800) SpO2:  [93 %-100 %] 99 % (02/02 0800) Last BM Date: 03/08/19 Mean arterial Pressure 80-90  Intake/Output:   Intake/Output Summary (Last 24 hours) at 03/09/2019 1012 Last data filed at 03/09/2019 0800 Gross per 24  hour  Intake 1667.57 ml  Output 1275 ml  Net 392.57 ml     Physical Exam: HEENT: normal Wounds: sternal incision and DL exit clean and dry General:  Extubated, neuro intact, c/o surgical pain  ENT : JVP normal Carotids; no bruits. No lymphadenopathy or thryomegaly appreciated. Cor: Mechanical heart sounds with LVAD hum present. Lungs: Decreased breath sounds left base Abdomen: soft, nontender, nondistended. No hepatosplenomegaly. No bruits or masses. Good bowel sounds. Extremities: no cyanosis, clubbing, rash, edema Neuro: alert & orientedx3, cranial nerves grossly intact. moves all 4 extremities w/o difficulty. Affect pleasant  Telemetry: nsr rate 100  Labs: Basic Metabolic Panel: Recent Labs  Lab 03/03/19 0434 03/03/19 0434 03/04/19 0312 03/04/19 0312 03/05/19 0343 03/05/19 0343 03/06/19 0500 03/06/19 0500 03/07/19 0315 03/08/19 0357 03/09/19 0210  NA 130*   < > 128*   < > 130*  --  130*  --  131* 129* 129*  K 3.4*   < > 4.1   < > 4.7  --  4.0  --  4.0 3.4* 2.9*  CL 94*   < > 95*   < > 100  --  99  --  97* 96* 96*  CO2 27   < > 24   < > 23  --  22  --  24 23 23   GLUCOSE 123*   < > 135*   < > 121*  --  150*  --  119* 111* 131*  BUN 12   < > 12   < > 8  --  8  --  8 7 9   CREATININE 0.68   < > 0.58*   < >  0.52*  --  0.62  --  0.65 0.60* 0.57*  CALCIUM 8.3*   < > 8.5*   < > 8.4*   < > 8.6*   < > 8.6* 8.7* 8.3*  MG 1.4*  --  1.8  --  1.9  --   --   --   --  1.8  --   PHOS 1.4*  --  1.9*  --  2.2*  --   --   --   --   --   --    < > = values in this interval not displayed.    Liver Function Tests: Recent Labs  Lab 03/03/19 0434 03/04/19 0312 03/05/19 0343 03/06/19 0500  AST 32 31 33 30  ALT 21 23 23 22   ALKPHOS 66 70 65 64  BILITOT 1.1 1.1 1.0 1.0  PROT 5.8* 6.1* 6.3* 6.5  ALBUMIN 2.3* 2.3* 2.4* 2.3*   No results for input(s): LIPASE, AMYLASE in the last 168 hours. No results for input(s): AMMONIA in the last 168 hours.  CBC: Recent Labs  Lab  03/03/19 0434 03/03/19 1547 03/04/19 0312 03/04/19 0312 03/05/19 0343 03/06/19 0500 03/07/19 0315 03/08/19 0357 03/09/19 0210  WBC 13.5*   < > 15.5*   < > 20.6* 24.5* 22.2* 20.7* 22.4*  NEUTROABS 8.3*  --  9.8*  --  14.3*  --   --   --   --   HGB 7.6*   < > 9.7*   < > 9.5* 9.7* 9.1* 8.9* 8.7*  HCT 22.6*   < > 28.4*   < > 28.5* 29.7* 27.8* 27.6* 26.7*  MCV 92.6   < > 89.9   < > 91.6 93.1 92.1 93.2 93.0  PLT 233   < > 313   < > 381 473* 514* 594* 627*   < > = values in this interval not displayed.    INR: Recent Labs  Lab 03/05/19 0343 03/06/19 0500 03/07/19 0315 03/08/19 0357 03/09/19 0210  INR 2.0* 2.0* 2.2* 2.0* 2.6*    Other results:  EKG:   Imaging: DG Chest 2 View  Result Date: 03/09/2019 CLINICAL DATA:  Cardiomyopathy. LVAD in place. EXAM: CHEST - 2 VIEW COMPARISON:  Multiple previous chest films. The most recent is 03/07/2019 FINDINGS: The LVAD is in good position, unchanged. No complicating features. Stable cardiac enlargement. The right PICC line is unchanged. Stable central vascular congestion, mild interstitial edema and left pleural effusion with left lower lobe atelectasis. IMPRESSION: 1. Stable support apparatus. 2. Stable mild interstitial edema, left pleural effusion and left lower lobe atelectasis. Electronically Signed   By: 03/09/2019 M.D.   On: 03/09/2019 05:54   ECHOCARDIOGRAM LIMITED  Result Date: 03/08/2019   ECHOCARDIOGRAM LIMITED REPORT   Patient Name:   Jon Martin Date of Exam: 03/08/2019 Medical Rec #:  05/06/2019       Height:       68.0 in Accession #:    [id number]      Weight:       110.4 lb Date of Birth:  01-29-1986        BSA:          1.59 m Patient Age:    33 years        BP:           76/63 mmHg Patient Gender: M               HR:  110 bpm. Exam Location:  Inpatient  Procedure: Limited Echo, Limited Color Doppler and Cardiac Doppler Indications:    LVAD [id number]  History:        Patient has prior history of Echocardiogram  examinations, most                 recent 02/26/2019. Polysubstance abuse.  Sonographer:    Elmarie Shiley Dance Referring Phys: [id number] DANIEL R BENSIMHON IMPRESSIONS  1. Left ventricular ejection fraction, by visual estimation, is <20%. The left ventricle has severely decreased function.  2. The left ventricle demonstrates global hypokinesis.  3. Global right ventricle has severely reduced systolic function.The right ventricular size is mildly enlarged.  4. Left atrial size was mildly dilated.  5. There is left bowing of the interatrial septum, suggestive of elevated right atrial pressure.  6. Right atrial size was mildly dilated.  7. Mild mitral valve regurgitation.  8. Tricuspid valve regurgitation moderate.  9. Limited study to assess LVAD; severe global reduction in LV systolic function; LVAD noted LV apex; mild biatrial enlargement; mild RVE with severe RV dysfunction; mild AI; mild MR and moderate TR. FINDINGS  Left Ventricle: Left ventricular ejection fraction, by visual estimation, is <20%. The left ventricle has severely decreased function. The left ventricle demonstrates global hypokinesis. The left ventricular internal cavity size was the left ventricle is normal in size. Right Ventricle: The right ventricular size is mildly enlarged. Global RV systolic function is has severely reduced systolic function. Left Atrium: Left atrial size was mildly dilated. Right Atrium: Right atrial size was mildly dilated. Pericardium: Trivial pericardial effusion is present is seen.There is pleural effusion in the left lateral region. Mitral Valve: Mild mitral valve regurgitation. Tricuspid Valve: Tricuspid valve regurgitation moderate. Aortic Valve: Aortic valve regurgitation is mild. Aorta: The aortic root is normal in size and structure. Shunts: There is left bowing of the interatrial septum, suggestive of elevated right atrial pressure. Additional Comments: Limited study to assess LVAD; severe global reduction in LV systolic  function; LVAD noted LV apex; mild biatrial enlargement; mild RVE with severe RV dysfunction; mild AI; mild MR and moderate TR.  LEFT VENTRICLE          Normals PLAX 2D LVIDd:         4.68 cm  3.6 cm LVIDs:         4.30 cm  1.7 cm LV PW:         0.84 cm  1.4 cm LV IVS:        0.57 cm  1.3 cm LVOT diam:     1.90 cm  2.0 cm LV SV:         18 ml    79 ml LV SV Index:   11.91    45 ml/m2 LVOT Area:     2.84 cm 3.14 cm2  LEFT ATRIUM         Index LA diam:    3.55 cm 2.24 cm/m   AORTA                 Normals Ao Root diam: 3.20 cm 31 mm Ao Asc diam:  2.50 cm 31 mm TRICUSPID VALVE             Normals TR Peak grad:   19.6 mmHg TR Vmax:        229.00 cm/s 288 cm/s  SHUNTS Systemic Diam: 1.90 cm  Olga Millers MD Electronically signed by Olga Millers MD Signature Date/Time: 03/08/2019/12:42:46 PM    Final  Medications:     Scheduled Medications: . aspirin EC  81 mg Oral Daily  . bisacodyl  10 mg Oral Daily   Or  . bisacodyl  10 mg Rectal Daily  . Chlorhexidine Gluconate Cloth  6 each Topical Daily  . chlorpheniramine-HYDROcodone  5 mL Oral Q12H  . digoxin  0.125 mg Oral Daily  . docusate sodium  200 mg Oral Daily  . furosemide  40 mg Intravenous Q12H  . guaiFENesin  600 mg Oral BID  . lactose free nutrition  237 mL Oral TID WC  . mouth rinse  15 mL Mouth Rinse BID  . pantoprazole  40 mg Oral Daily  . potassium chloride  30 mEq Oral BID  . potassium chloride  20 mEq Oral Q4H  . sildenafil  20 mg Oral TID  . sodium chloride flush  10 mL Intravenous Q12H  . spironolactone  12.5 mg Oral Daily    Infusions: . sodium chloride 10 mL/hr at 03/09/19 0600  . meropenem (MERREM) IV Stopped (03/09/19 0316)  . vancomycin Stopped (03/08/19 2325)    PRN Medications: sodium chloride, hydrALAZINE, ondansetron (ZOFRAN) IV, oxyCODONE, sodium chloride flush, sorbitol, traMADol   Assessment:  Postop coagulopathy- resolved Expected posop blood loss anemia -hemoglobin stable 9.5 VAD parameters  satisfactorywith flow 4.2 L Airway secretions/ RUL atelectasis- improved but with cough and elevated WBC will continue iv antibiotics  Sternal incision, DL incision clean, dry Postop bilrubin 4.2 >> 5.4>> 5.0 >> 2.4,  >> 1.2 follow Postop elevated MAP- now improved   Plan/Discussion:     Milrinone weaned off with adequate mixed venous saturation Ambulating  with PT Heart healthy diet, pulmonary toilet with flutter valve and incentive spirometry Postop mod L pleural effusion- L thoracentesis when INR down 2.2  Pharm D now dosing coumadin  I reviewed the LVAD parameters from today, and compared the results to the patient's prior recorded data.  No programming changes were made.  The LVAD is functioning within specified parameters.  The patient performs LVAD self-test daily.  LVAD interrogation was negative for any significant power changes, alarms or PI events/speed drops.  LVAD equipment check completed and is in good working order.  Back-up equipment present.   LVAD education done on emergency procedures and precautions and reviewed exit site care.  Length of Stay: [place] III 03/09/2019, 10:12 AM

## 2019-03-09 NOTE — Progress Notes (Addendum)
Patient ID: Jon Martin, male   DOB: [birth year], 34 y.o.   MRN: [id number]     Advanced Heart Failure Rounding Note  PCP-Cardiologist: No primary care provider on file.   Subjective:    34 y.o. 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  Underwent HM-3 placement 02/26/2019. Ramp ECHO completed 2/1. Continue speed at 5500.   Extubated 1/23. CXR 1/25 w/ new moderate size right pneumothorax s/p CT placement. CT removed 1/28   CXR with L pleural effusion.    Now on meropenmum + vancomycin.  Has dense left basilar opacity.  WBC12>>15>>20>>24.5>>20>>22      Hgb improved after transfusion, received 1 unit PRBCs on 1/27. Hgb trending down 9.7>9.1>8.9 >8.7   Coughing today. Denies SOB   LVAD Interrogation HM 3: Speed: 5500 Flow 4.4  PI: 2.7 Power: 4  Rare PI events/24 hrs. VAD interrogated personally. Parameters stable.  Objective:   Weight Range: 50.1 kg Body mass index is 16.79 kg/m.   Vital Signs:   Temp:  [98.9 F (37.2 C)-99.9 F (37.7 C)] 99.4 F (37.4 C) (02/02 0803) Pulse Rate:  [25-116] 107 (02/02 0800) Resp:  [18-37] 18 (02/02 0800) BP: (64-127)/(47-110) 90/65 (02/02 0800) SpO2:  [93 %-100 %] 99 % (02/02 0800) Last BM Date: 03/08/19  Weight change: Filed Weights   03/06/19 0600 03/07/19 0600 03/08/19 0500  Weight: 51.3 kg 49.7 kg 50.1 kg    Intake/Output:   Intake/Output Summary (Last 24 hours) at 03/09/2019 0837 Last data filed at 03/09/2019 0800 Gross per 24 hour  Intake 1667.57 ml  Output 1575 ml  Net 92.57 ml      Physical Exam  CVP 14-15  Physical Exam: GENERAL:No acute distress. + cough HEENT: normal  Anicteric NECK: Supple, JVP to jaw    2+ bilaterally, no bruits.  No lymphadenopathy or thyromegaly appreciated.   CARDIAC:  Mechanical heart sounds with LVAD hum present.  LUNGS:  Decreased in the bases L >R ABDOMEN:  Soft, round, nontender, positive bowel sounds x4.     LVAD exit site: Dressing dry and intact.  No  erythema or drainage.  Stabilization device present and accurately applied.  Driveline dressing is being changed daily per sterile technique. Extremities: no cyanosis, clubbing, rash, edema Neuro: alert & oriented x 3, cranial nerves grossly intact. moves all 4 extremities w/o difficulty. Affect pleasant    Telemetry  Sinus Tach 110s  Labs    CBC Recent Labs    03/08/19 0357 03/09/19 0210  WBC 20.7* 22.4*  HGB 8.9* 8.7*  HCT 27.6* 26.7*  MCV 93.2 93.0  PLT 594* 627*   Basic Metabolic Panel Recent Labs    89/37/34 0357 03/09/19 0210  NA 129* 129*  K 3.4* 2.9*  CL 96* 96*  CO2 23 23  GLUCOSE 111* 131*  BUN 7 9  CREATININE 0.60* 0.57*  CALCIUM 8.7* 8.3*  MG 1.8  --    Liver Function Tests No results for input(s): AST, ALT, ALKPHOS, BILITOT, PROT, ALBUMIN in the last 72 hours. No results for input(s): LIPASE, AMYLASE in the last 72 hours. Cardiac Enzymes No results for input(s): CKTOTAL, CKMB, CKMBINDEX, TROPONINI in the last 72 hours.  BNP: BNP (last 3 results) Recent Labs    02/10/19 0219 02/27/19 0433 03/05/19 0056  BNP 2,048.6* 447.0* 427.2*    ProBNP (last 3 results) No results for input(s): PROBNP in the last 8760 hours.   D-Dimer No results for input(s): DDIMER in the last 72 hours.  Hemoglobin A1C No results for input(s): HGBA1C in the last 72 hours. Fasting Lipid Panel No results for input(s): CHOL, HDL, LDLCALC, TRIG, CHOLHDL, LDLDIRECT in the last 72 hours. Thyroid Function Tests No results for input(s): TSH, T4TOTAL, T3FREE, THYROIDAB in the last 72 hours.  Invalid input(s): FREET3  Other results:   Imaging    DG Chest 2 View  Result Date: 03/09/2019 CLINICAL DATA:  Cardiomyopathy. LVAD in place. EXAM: CHEST - 2 VIEW COMPARISON:  Multiple previous chest films. The most recent is 03/07/2019 FINDINGS: The LVAD is in good position, unchanged. No complicating features. Stable cardiac enlargement. The right PICC line is unchanged. Stable  central vascular congestion, mild interstitial edema and left pleural effusion with left lower lobe atelectasis. IMPRESSION: 1. Stable support apparatus. 2. Stable mild interstitial edema, left pleural effusion and left lower lobe atelectasis. Electronically Signed   By: Rudie Meyer M.D.   On: 03/09/2019 05:54   ECHOCARDIOGRAM LIMITED  Result Date: 03/08/2019   ECHOCARDIOGRAM LIMITED REPORT   Patient Name:   Jon Martin Date of Exam: 03/08/2019 Medical Rec #:  [id number]       Height:       68.0 in Accession #:    [id number]      Weight:       110.4 lb Date of Birth:  Mar 14, 1985        BSA:          1.59 m Patient Age:    33 years        BP:           76/63 mmHg Patient Gender: M               HR:           110 bpm. Exam Location:  Inpatient  Procedure: Limited Echo, Limited Color Doppler and Cardiac Doppler Indications:    LVAD [id number]  History:        Patient has prior history of Echocardiogram examinations, most                 recent 02/26/2019. Polysubstance abuse.  Sonographer:    Elmarie Shiley Dance Referring Phys: [id number] Mignogna R Josslynn Mentzer IMPRESSIONS  1. Left ventricular ejection fraction, by visual estimation, is <20%. The left ventricle has severely decreased function.  2. The left ventricle demonstrates global hypokinesis.  3. Global right ventricle has severely reduced systolic function.The right ventricular size is mildly enlarged.  4. Left atrial size was mildly dilated.  5. There is left bowing of the interatrial septum, suggestive of elevated right atrial pressure.  6. Right atrial size was mildly dilated.  7. Mild mitral valve regurgitation.  8. Tricuspid valve regurgitation moderate.  9. Limited study to assess LVAD; severe global reduction in LV systolic function; LVAD noted LV apex; mild biatrial enlargement; mild RVE with severe RV dysfunction; mild AI; mild MR and moderate TR. FINDINGS  Left Ventricle: Left ventricular ejection fraction, by visual estimation, is <20%. The left ventricle has  severely decreased function. The left ventricle demonstrates global hypokinesis. The left ventricular internal cavity size was the left ventricle is normal in size. Right Ventricle: The right ventricular size is mildly enlarged. Global RV systolic function is has severely reduced systolic function. Left Atrium: Left atrial size was mildly dilated. Right Atrium: Right atrial size was mildly dilated. Pericardium: Trivial pericardial effusion is present is seen.There is pleural effusion in the left lateral region. Mitral Valve: Mild mitral valve regurgitation. Tricuspid Valve: Tricuspid valve regurgitation moderate.  Aortic Valve: Aortic valve regurgitation is mild. Aorta: The aortic root is normal in size and structure. Shunts: There is left bowing of the interatrial septum, suggestive of elevated right atrial pressure. Additional Comments: Limited study to assess LVAD; severe global reduction in LV systolic function; LVAD noted LV apex; mild biatrial enlargement; mild RVE with severe RV dysfunction; mild AI; mild MR and moderate TR.  LEFT VENTRICLE          Normals PLAX 2D LVIDd:         4.68 cm  3.6 cm LVIDs:         4.30 cm  1.7 cm LV PW:         0.84 cm  1.4 cm LV IVS:        0.57 cm  1.3 cm LVOT diam:     1.90 cm  2.0 cm LV SV:         18 ml    79 ml LV SV Index:   11.91    45 ml/m2 LVOT Area:     2.84 cm 3.14 cm2  LEFT ATRIUM         Index LA diam:    3.55 cm 2.24 cm/m   AORTA                 Normals Ao Root diam: 3.20 cm 31 mm Ao Asc diam:  2.50 cm 31 mm TRICUSPID VALVE             Normals TR Peak grad:   19.6 mmHg TR Vmax:        229.00 cm/s 288 cm/s  SHUNTS Systemic Diam: 1.90 cm  Kirk Ruths MD Electronically signed by Kirk Ruths MD Signature Date/Time: 03/08/2019/12:42:46 PM    Final      Medications:     Scheduled Medications: . aspirin EC  81 mg Oral Daily  . bisacodyl  10 mg Oral Daily   Or  . bisacodyl  10 mg Rectal Daily  . Chlorhexidine Gluconate Cloth  6 each Topical Daily  .  chlorpheniramine-HYDROcodone  5 mL Oral Q12H  . digoxin  0.125 mg Oral Daily  . docusate sodium  200 mg Oral Daily  . furosemide  40 mg Oral Daily  . guaiFENesin  600 mg Oral BID  . lactose free nutrition  237 mL Oral TID WC  . mouth rinse  15 mL Mouth Rinse BID  . pantoprazole  40 mg Oral Daily  . potassium chloride  20 mEq Oral Q4H  . sildenafil  20 mg Oral TID  . sodium chloride flush  10 mL Intravenous Q12H  . spironolactone  12.5 mg Oral Daily    Infusions: . sodium chloride 10 mL/hr at 03/09/19 0600  . meropenem (MERREM) IV Stopped (03/09/19 0316)  . vancomycin Stopped (03/08/19 2325)    PRN Medications: sodium chloride, hydrALAZINE, ondansetron (ZOFRAN) IV, oxyCODONE, sodium chloride flush, sorbitol, traMADol     Assessment/Plan   1. Acute/Chronic Systolic HF -> cardiogenic shock - ECHO at [facility] EF, 20%.  - ECHO repeated and showed severely reduced EF <20%. RV only mildly down - R/L cath 1/7 normal cors. Output ok   - s/p HM-3 on 02/26/2019 -- Plan Ramp Echo today.   - CO-OX 61% off milrinone. Continue digoxin 0.125 mg daily, dig level 0.2 on 03/06/19 - CVP 14. Stop po lasix. Give 40 mg IV lasix twice a day.  - Once diuresed he will need higher dose of oral diuretics.  Supplement K.   -  Continue 12.5 mg spiro daily.   - VAD interrogated personally. Parameters stable.   2. VAD -  s/p HM-3 on 02/26/2019 -  RV down some.  - Stable off milrinone. CO-OX 59%  -  VAD interrogated personally. Parameters stable. - Continue sildenafil 20 mg three times a day.  - LDH stable 322 - Hold coumadin with pending thoracentesis.  - Continue ASA 81 - INR therapeutic 2.6 . INR Goal 2-2.5 - Ramp ECHO completed 03/07/18. Continue Speed 5500.   3. Acute post-op respiratory failure with RUL PNA - Extubated 1/23.  - He is on cefepime + vancomycin for possible LLL PNA, Tm 99.4  WBCs 24.5>22>20.    4. Acute blood loss anemia - keep HGB > 8.0 - s/p transfusion x 1 unit 1/27 for hgb  of 7.6.  - Hgb has been trending down 9.7>9.1>8.9>8.7  - No obvious source.  - CBC in am.   5. Elevated bilirubin - Resolved.   6. Moderate size right pneumothorax: new on CXR 1/25.  - s/p placement for Rt sided CT. F/u CXR showed complete resolution of apical pneumothorax.  - management per CT surgery   7. Hypokalemia/ Hypomangesemia: - Keep K >4.0 and Mg > 2.0   - Supp K.   8. Deconditioning: - continue to ambulate w/ PT.  - May need HHPT   9. Left Pleural Effusion  IR to tap. Hold coumadin.   Move to [facility]    Length of Stay: 28  [name], NP  03/09/2019, 8:37 AM  Advanced Heart Failure Team Pager [phone] (M-F; 7a - 4p)  Please contact [facility] for night-coverage after hours (4p -7a ) and weekends on [email or web address]   Patient seen and examined with the above-signed Advanced Practice Provider and/or Housestaff. I personally reviewed laboratory data, imaging studies and relevant notes. I independently examined the patient and formulated the important aspects of the plan. I have edited the note to reflect any of my changes or salient points. I have personally discussed the plan with the patient and/or family.  Still with cough. Feels weak but improving.  On abx. WBC trending back up however. Ramp echo done and speed unchanged. Still with significant RV dysfunction. Co-ox stable off milrinone. Volume status up. Will give IV lasix.   Has significant L pleural effusion. D/w Dr. Donata Clay. Will plan tap with IR tomorrow. INR 2.6. Will hold coumadin (d/w PharmD)   VAD interrogated personally. Parameters stable.   Arvilla Meres, MD  6:27 PM

## 2019-03-10 ENCOUNTER — Inpatient Hospital Stay (HOSPITAL_COMMUNITY): Payer: Medicaid - Out of State

## 2019-03-10 HISTORY — PX: IR THORACENTESIS ASP PLEURAL SPACE W/IMG GUIDE: IMG5380

## 2019-03-10 LAB — COOXEMETRY PANEL
Carboxyhemoglobin: 1.2 % (ref 0.5–1.5)
Methemoglobin: 0.5 % (ref 0.0–1.5)
O2 Saturation: 63.5 %
Total hemoglobin: 11.1 g/dL — ABNORMAL LOW (ref 12.0–16.0)

## 2019-03-10 LAB — BASIC METABOLIC PANEL
Anion gap: 10 (ref 5–15)
Anion gap: 9 (ref 5–15)
BUN: 7 mg/dL (ref 6–20)
BUN: 6 mg/dL (ref 6–20)
CO2: 24 mmol/L (ref 22–32)
CO2: 24 mmol/L (ref 22–32)
Calcium: 8.6 mg/dL — ABNORMAL LOW (ref 8.9–10.3)
Calcium: 9 mg/dL (ref 8.9–10.3)
Chloride: 99 mmol/L (ref 98–111)
Chloride: 99 mmol/L (ref 98–111)
Creatinine, Ser: 0.58 mg/dL — ABNORMAL LOW (ref 0.61–1.24)
Creatinine, Ser: 0.6 mg/dL — ABNORMAL LOW (ref 0.61–1.24)
GFR calc Af Amer: >60 mL/min (ref >60)
GFR calc Af Amer: >60 mL/min (ref >60)
GFR calc non Af Amer: >60 mL/min (ref >60)
GFR calc non Af Amer: >60 mL/min (ref >60)
Glucose, Bld: 103 mg/dL — ABNORMAL HIGH (ref 70–99)
Glucose, Bld: 103 mg/dL — ABNORMAL HIGH (ref 70–99)
Potassium: 3.8 mmol/L (ref 3.5–5.1)
Potassium: 3.9 mmol/L (ref 3.5–5.1)
Sodium: 133 mmol/L — ABNORMAL LOW (ref 135–145)
Sodium: 132 mmol/L — ABNORMAL LOW (ref 135–145)

## 2019-03-10 LAB — GRAM STAIN

## 2019-03-10 LAB — CBC
HCT: 26.4 % — ABNORMAL LOW (ref 39.0–52.0)
Hemoglobin: 8.7 g/dL — ABNORMAL LOW (ref 13.0–17.0)
MCH: 30.2 pg (ref 26.0–34.0)
MCHC: 33 g/dL (ref 30.0–36.0)
MCV: 91.7 fL (ref 80.0–100.0)
Platelets: 659 10*3/uL — ABNORMAL HIGH (ref 150–400)
RBC: 2.88 MIL/uL — ABNORMAL LOW (ref 4.22–5.81)
RDW: 14.1 % (ref 11.5–15.5)
WBC: 21.6 10*3/uL — ABNORMAL HIGH (ref 4.0–10.5)
nRBC: 0 % (ref 0.0–0.2)

## 2019-03-10 LAB — VANCOMYCIN, TROUGH: Vancomycin Tr: 9 ug/mL — ABNORMAL LOW (ref 15–20)

## 2019-03-10 LAB — PROTIME-INR
INR: 1.8 — ABNORMAL HIGH (ref 0.8–1.2)
Prothrombin Time: 20.8 seconds — ABNORMAL HIGH (ref 11.4–15.2)

## 2019-03-10 LAB — LACTATE DEHYDROGENASE: LDH: 328 U/L — ABNORMAL HIGH (ref 98–192)

## 2019-03-10 MED ORDER — WARFARIN - PHARMACIST DOSING INPATIENT
Freq: Every day | Status: DC
  Administered 2019-03-12: 1

## 2019-03-10 MED ORDER — VANCOMYCIN HCL 1250 MG/250ML IV SOLN
1250.0000 mg | Freq: Two times a day (BID) | INTRAVENOUS | Status: AC
  Administered 2019-03-10 – 2019-03-13 (×7): 1250 mg via INTRAVENOUS
  Filled 2019-03-10 (×7): qty 250

## 2019-03-10 MED ORDER — LIDOCAINE HCL (PF) 1 % IJ SOLN
INTRAMUSCULAR | Status: DC | PRN
  Administered 2019-03-10: 10 mL

## 2019-03-10 MED ORDER — FLUCONAZOLE 100 MG PO TABS
100.0000 mg | ORAL_TABLET | Freq: Every day | ORAL | Status: AC
  Administered 2019-03-10 – 2019-03-12 (×3): 100 mg via ORAL
  Filled 2019-03-10 (×3): qty 1

## 2019-03-10 MED ORDER — LIDOCAINE HCL 1 % IJ SOLN
INTRAMUSCULAR | Status: AC
  Filled 2019-03-10: qty 20

## 2019-03-10 MED ORDER — SPIRONOLACTONE 25 MG PO TABS
25.0000 mg | ORAL_TABLET | Freq: Every day | ORAL | Status: DC
  Administered 2019-03-11 – 2019-03-15 (×5): 25 mg via ORAL
  Filled 2019-03-10 (×6): qty 1

## 2019-03-10 MED ORDER — FUROSEMIDE 80 MG PO TABS
80.0000 mg | ORAL_TABLET | Freq: Every day | ORAL | Status: DC
  Administered 2019-03-11 – 2019-03-15 (×5): 80 mg via ORAL
  Filled 2019-03-10 (×6): qty 1

## 2019-03-10 MED ORDER — WARFARIN SODIUM 5 MG PO TABS
5.0000 mg | ORAL_TABLET | Freq: Once | ORAL | Status: AC
  Administered 2019-03-10: 18:00:00 5 mg via ORAL
  Filled 2019-03-10: qty 1

## 2019-03-10 NOTE — Plan of Care (Signed)

## 2019-03-10 NOTE — TOC Progression Note (Addendum)
Transition of Care [facility]) - Progression Note    Patient Details  Name: Maston Wight MRN: [id number] Date of Birth: 12-03-1985  Transition of Care [facility]) CM/SW Contact  Leone Haven, RN Phone Number: 03/10/2019, 3:25 PM  Clinical Narrative:    NCM received call from [name] , lvad coordinator , she states this patient will need HHRN, HHPT,HHOT when goes home at end of week.  NCM spoke with patient offered choice he states he does not have a preference because he does not know who covers his area.  NCM made referral to [facility] [phone] , they cover the [facility] [place] area.  Rep states she will have to call me back to let me know if they can take this referral.  Awaiting call back. NCM received call back and they were not able to take patient. Will have to find another agency.  2/5- NCM contacted [facility] at [phone] and made referral for HHRN,HHPT, HHOT. Awaiting call back to see if they can take referral.   Expected Discharge Plan: Home w Home Health Services Barriers to Discharge: (Plan for LVAD Friday)  Expected Discharge Plan and Services Expected Discharge Plan: Home w Home Health Services In-house Referral: NA Discharge Planning Services: CM Consult Post Acute Care Choice: Home Health, Durable Medical Equipment Living arrangements for the past 2 months: Single Family Home                                       Social Determinants of Health (SDOH) Interventions Alcohol Brief Interventions/Follow-up: Continued Monitoring  Readmission Risk Interventions No flowsheet data found.

## 2019-03-10 NOTE — Progress Notes (Addendum)
Patient ID: Arvil Utz, male   DOB: Dec 24, 1985, 34 y.o.   MRN: [id number]     Advanced Heart Failure Rounding Note  PCP-Cardiologist: No primary care provider on file.   Subjective:    34 y.o. 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  Underwent HM-3 placement 02/26/2019. Ramp ECHO completed 2/1. Continue speed at 5500.   Extubated 1/23. CXR 1/25 w/ new moderate size right pneumothorax s/p CT placement. CT removed 1/28   CXR with L pleural effusion.    Now on meropenmum + vancomycin.  Has dense left basilar opacity.  WBC  22.4 -> 21.6   Hgb improved after transfusion, received 1 unit PRBCs on 1/27. Stable 8.7   Yesterday diuresed with 40 mg IV lasix x2. CVP back down to 3. Weight down 7 pounds.   Denies SOB. Denies pain.   LVAD Interrogation HM 3: Speed: 5500 Flow 4.5  PI: 2.6 Power: 4  Rare PI events/24 hrs. VAD interrogated personally. Parameters stable.  Objective:   Weight Range: 49.3 kg Body mass index is 16.53 kg/m.   Vital Signs:  Maps 70-80s   Temp:  [98.4 F (36.9 C)-100 F (37.8 C)] 100 F (37.8 C) (02/03 0317) Pulse Rate:  [32-114] 114 (02/02 1959) Resp:  [18-36] 25 (02/03 0317) BP: (74-111)/(43-85) 102/66 (02/03 0317) SpO2:  [83 %-100 %] 99 % (02/03 0317) Weight:  [49.3 kg-52.4 kg] 49.3 kg (02/03 0317) Last BM Date: 03/08/19  Weight change: Filed Weights   03/08/19 0500 03/09/19 1400 03/10/19 0317  Weight: 50.1 kg 52.4 kg 49.3 kg    Intake/Output:   Intake/Output Summary (Last 24 hours) at 03/10/2019 0742 Last data filed at 03/10/2019 0600 Gross per 24 hour  Intake 1459.92 ml  Output 3400 ml  Net -1940.08 ml      Physical Exam   CVP 3  Physical Exam: GENERAL: No acute distress. HEENT: normal  NECK: Supple, JVP  Flat .  2+ bilaterally, no bruits.  No lymphadenopathy or thyromegaly appreciated.   CARDIAC:  Mechanical heart sounds with LVAD hum present.  LUNGS: Decreased L>R  ABDOMEN:  Soft, round, nontender,  positive bowel sounds x4.     LVAD exit site: Dressing dry and intact.  No erythema or drainage.  Stabilization device present and accurately applied.  Driveline dressing is being changed daily per sterile technique. EXTREMITIES:  Warm and dry, no cyanosis, clubbing, rash or edema . RUE PICC  NEUROLOGIC:  Alert and oriented x 4.  Gait steady.  No aphasia.  No dysarthria.  Affect pleasant.        Telemetry  Sinus Tach 110s personally reviewed.  Labs    CBC Recent Labs    03/09/19 0210 03/10/19 0400  WBC 22.4* 21.6*  HGB 8.7* 8.7*  HCT 26.7* 26.4*  MCV 93.0 91.7  PLT 627* 659*   Basic Metabolic Panel Recent Labs    87/57/97 0357 03/09/19 0210 03/09/19 1655 03/10/19 0400  NA 129*   < > 132* 133*  K 3.4*   < > 3.9 3.8  CL 96*   < > 97* 99  CO2 23   < > 25 24  GLUCOSE 111*   < > 126* 103*  BUN 7   < > 6 7  CREATININE 0.60*   < > 0.64 0.58*  CALCIUM 8.7*   < > 8.9 8.6*  MG 1.8  --   --   --    < > = values in this interval  not displayed.   Liver Function Tests No results for input(s): AST, ALT, ALKPHOS, BILITOT, PROT, ALBUMIN in the last 72 hours. No results for input(s): LIPASE, AMYLASE in the last 72 hours. Cardiac Enzymes No results for input(s): CKTOTAL, CKMB, CKMBINDEX, TROPONINI in the last 72 hours.  BNP: BNP (last 3 results) Recent Labs    02/10/19 0219 02/27/19 0433 03/05/19 0056  BNP 2,048.6* 447.0* 427.2*    ProBNP (last 3 results) No results for input(s): PROBNP in the last 8760 hours.   D-Dimer No results for input(s): DDIMER in the last 72 hours. Hemoglobin A1C No results for input(s): HGBA1C in the last 72 hours. Fasting Lipid Panel No results for input(s): CHOL, HDL, LDLCALC, TRIG, CHOLHDL, LDLDIRECT in the last 72 hours. Thyroid Function Tests No results for input(s): TSH, T4TOTAL, T3FREE, THYROIDAB in the last 72 hours.  Invalid input(s): FREET3  Other results:   Imaging    No results found.   Medications:     Scheduled  Medications: . aspirin EC  81 mg Oral Daily  . bisacodyl  10 mg Oral Daily   Or  . bisacodyl  10 mg Rectal Daily  . Chlorhexidine Gluconate Cloth  6 each Topical Daily  . chlorpheniramine-HYDROcodone  5 mL Oral Q12H  . digoxin  0.125 mg Oral Daily  . docusate sodium  200 mg Oral Daily  . guaiFENesin  600 mg Oral BID  . lactose free nutrition  237 mL Oral TID WC & HS  . mouth rinse  15 mL Mouth Rinse BID  . pantoprazole  40 mg Oral Daily  . sildenafil  20 mg Oral TID  . sodium chloride flush  10 mL Intravenous Q12H  . spironolactone  12.5 mg Oral Daily    Infusions: . sodium chloride Stopped (03/09/19 1500)  . meropenem (MERREM) IV 1 g (03/10/19 0330)  . vancomycin Stopped (03/09/19 2240)    PRN Medications: sodium chloride, hydrALAZINE, ondansetron (ZOFRAN) IV, oxyCODONE, sodium chloride flush, sorbitol, traMADol     Assessment/Plan   1. Acute/Chronic Systolic HF -> cardiogenic shock - ECHO at [facility] EF, 20%.  - ECHO repeated and showed severely reduced EF <20%. RV only mildly down - R/L cath 1/7 normal cors. Output ok   - s/p HM-3 on 02/26/2019 - Ramp echo completed - no change in speed.   - CO-OX 61% off milrinone. Continue digoxin 0.125 mg daily, dig level 0.2 on 03/06/19 - CVP back down to 3. Stop IV lasix.  40 mg po lasix daily was not enough so will start lasix 80 mg daily.  - Increase spiro to 25 mg daily.    - VAD interrogated personally. Parameters stable.   2. VAD -  s/p HM-3 on 02/26/2019 -  RV down some.  - Stable off milrinone. CO-OX 63.5 %  -  VAD interrogated personally. Parameters stable. - Continue sildenafil 20 mg three times a day.  - LDH stable 328  - Hold coumadin with pending thoracentesis.  - Continue ASA 81 - INR therapeutic 1.8 . INR Goal 2-2.5 - Ramp ECHO completed 03/07/18. Continue Speed 5500.   3. Acute post-op respiratory failure with RUL PNA - Extubated 1/23.  - He is on meropenum and vanc WBCs 24.5>22>20>21.6 .    4. Acute blood  loss anemia - keep HGB > 8.0 - s/p transfusion x 1 unit 1/27 for hgb of 7.6.  - Hgb has been trending down 9.7>9.1>8.9>8.7 >8.7  - No obvious source.   5. Elevated bilirubin -  Resolved.   6. Moderate size right pneumothorax: new on CXR 1/25.  - s/p placement for Rt sided CT. F/u CXR showed complete resolution of apical pneumothorax.  - management per CT surgery   7. Hypokalemia/ Hypomangesemia: - Keep K >4.0 and Mg > 2.0   - Supp K.   8. Deconditioning: - continue to ambulate w/ PT.  - May need HHPT   9. Left Pleural Effusion  IR to tap. Hold coumadin. INR 1.8    Length of Stay: [place], NP  03/10/2019, 7:42 AM  Advanced Heart Failure Team Pager [phone] (M-F; 7a - 4p)  Please contact [facility] for night-coverage after hours (4p -7a ) and weekends on [email or web address]   Patient seen and examined with the above-signed Advanced Practice Provider and/or Housestaff. I personally reviewed laboratory data, imaging studies and relevant notes. I independently examined the patient and formulated the important aspects of the plan. I have edited the note to reflect any of my changes or salient points. I have personally discussed the plan with the patient and/or family.  Feeling ok. Stills somewhat week. Has ongoing cough. WBC 22k -> 21K. On vanc/meropenum. For left thoracentesis today. If no improvement may have to consider further imaging versus addition of fungal coverage.  INR 1.8 (warfarin on hold for thora). Restart tonight.   Volume status ok. Stop IV lasix. Continue sildenafil.   VAD interrogated personally. Parameters stable. LDH ok   Glori Bickers, MD  8:17 AM

## 2019-03-10 NOTE — Progress Notes (Addendum)
Pharmacy Antibiotic Note  Jon Martin is a 34 y.o. male admitted on 02/09/2019 s/p LVAD. Post-op abx was broaded to ceftaz for possible pna. Coverage was broadened to cefepime d/t increasing WBC and increased cough. Pharmacy has been consulted for meropenem and vancomycin dosing.  Vancomycin trough level drawn today is low at 9 (goal 15-20). Remains afebrile. Scr 0.58 stable. WBC down slightly at 21.6. Patient now s/p thoracentesis today, yielding 1.1 L pleural fluid. Patient also started on fluconazole today.   Plan: Increase vancomycin to 1250 mg IV Q12 hours  Continue Meropenem 1 gm IV q8h Monitor clinical improvement, renal function, and abx narrowing when appropriate  Height: 5\' 8"  (172.7 cm) Weight: 108 lb 11 oz (49.3 kg) IBW/kg (Calculated) : 68.4  Temp (24hrs), Avg:98.8 F (37.1 C), Min:97.8 F (36.6 C), Max:100 F (37.8 C)  Recent Labs  Lab 03/06/19 0500 03/06/19 0500 03/07/19 0315 03/08/19 0357 03/09/19 0210 03/09/19 1655 03/10/19 0400 03/10/19 1228  WBC 24.5*  --  22.2* 20.7* 22.4*  --  21.6*  --   CREATININE 0.62   < > 0.65 0.60* 0.57* 0.64 0.58*  --   VANCOTROUGH  --   --   --   --   --   --   --  9*   < > = values in this interval not displayed.    Estimated Creatinine Clearance: 91.6 mL/min (A) (by C-G formula based on SCr of 0.58 mg/dL (L)).    Ceftaz 1/24 > 1/28 Cefepime 1/28 >>1/29 Cefuroxime 1/22>1/24 Vancomycin 1/22 >1/23, 1/30 >> Meropenem 1/29 >  Fluconazole 2/3 >  2/3 pleural fluid Cx: sent 1/30 BCx: ngtd 1/15 BCx x 2: neg 1/9 Resp Cx: rare budding yeast  3/9, PharmD PGY1 Pharmacy Resident Phone: [phone] 03/10/2019  1:47 PM  Please check [email or web address] for unit-specific pharmacy phone numbers.

## 2019-03-10 NOTE — Progress Notes (Addendum)
VAD Coordinator Procedure Note:   VAD Coordinator met patient in IR. Pt undergoing left thoracentesis per Brynda Greathouse PA-C. Hemodynamics and VAD parameters monitored by myself throughout the procedure. Blood pressures were obtained with automatic cuff on left arm.    Time: Doppler Auto  BP Flow PI Power Speed  Pre-procedure:  0955  77/66 (72) 4.4 2.8 4.1 5500   1000  98/78 (86) 4.3 3.2 4.0 5500           Lidocaine Injection 1012         1015  103/63 (73) 4.5 2.6 4.1 5500   1020  102/88 (95) 4.7 2.2 4.1 5500           Recovery Area: 1025  95/74 (83) 4.2 3.1 4.2 5500             Patient tolerated the procedure well. VAD Coordinator accompanied and remained with patient in recovery area.    Patient Disposition: 1.1 liters removed during thoracentesis. Specimen sent for culture. Post procedure xray obtained. Transported back to 2C02. Bedside report given. Will hold PO Lasix 80 mg today per Dr Haroldine Laws.   Emerson Monte RN [place] Coordinator  Office: [phone]  24/7 Pager: [phone]

## 2019-03-10 NOTE — Progress Notes (Signed)
Physical Therapy Treatment Patient Details Name: Jon Martin MRN: [id number] DOB: [birth year] Today's Date: 03/10/2019    History of Present Illness This 34 y.o. male admitted with acute/chronic systolic HF  with cardiogenic shock.  He underwent placement of HM 3 LVAD on 02/26/2019. Post op he developed RUL PNA, as well as acute blood loss anemia. CXR 1/25 w/ new moderate size right pneumothorax s/p CT placement. PMH includes: h/o poly substance abuse, dilated cardiomyopathy    PT Comments    Patient continues to make progress toward PT goals and tolerated mobility well including stair training this session. PT will continue to follow.     Follow Up Recommendations  Home health PT;Supervision for mobility/OOB     Equipment Recommendations  None recommended by PT    Recommendations for Other Services       Precautions / Restrictions Precautions Precautions: Sternal Precaution Comments: LVAD    Mobility  Bed Mobility Overal bed mobility: Modified Independent             General bed mobility comments: no use of rails/UE or physical assist needed; cues for managing LVAD lines safely before sitting upright  Transfers Overall transfer level: Modified independent   Transfers: Sit to/from Stand              Ambulation/Gait Ambulation/Gait assistance: Supervision Gait Distance (Feet): 600 Feet Assistive device: None Gait Pattern/deviations: Step-through pattern;Decreased stride length;Drifts right/left;WFL(Within Functional Limits)     General Gait Details: steady gait; no LOB with horizontal head turns, directional changes, or turning; denies SOB with ambulation; HR in 120s   Stairs Stairs: Yes Stairs assistance: Min guard;Supervision Stair Management: No rails;Alternating pattern;Forwards Number of Stairs: 4(limited by IV lines) General stair comments: min guard for safety when descending and supervision to ascend; no UE support needed   Wheelchair  Mobility    Modified Rankin (Stroke Patients Only)       Balance Overall balance assessment: Needs assistance Sitting-balance support: Feet supported;No upper extremity supported Sitting balance-Leahy Scale: Good     Standing balance support: During functional activity Standing balance-Leahy Scale: Good                              Cognition Arousal/Alertness: Awake/alert Behavior During Therapy: WFL for tasks assessed/performed Overall Cognitive Status: Within Functional Limits for tasks assessed                                        Exercises      General Comments General comments (skin integrity, edema, etc.): pt able to switch from batteries to wall with supervision and needed assist to switch from wall to batteries       Pertinent Vitals/Pain Pain Assessment: No/denies pain    Home Living                      Prior Function            PT Goals (current goals can now be found in the care plan section) Progress towards PT goals: Progressing toward goals    Frequency    Min 3X/week      PT Plan Current plan remains appropriate    Co-evaluation              AM-PAC PT "6 Clicks" Mobility   Outcome Measure  Help needed turning  from your back to your side while in a flat bed without using bedrails?: None Help needed moving from lying on your back to sitting on the side of a flat bed without using bedrails?: None Help needed moving to and from a bed to a chair (including a wheelchair)?: A Little Help needed standing up from a chair using your arms (e.g., wheelchair or bedside chair)?: None Help needed to walk in hospital room?: A Little Help needed climbing 3-5 steps with a railing? : A Little 6 Click Score: 21    End of Session   Activity Tolerance: Patient tolerated treatment well Patient left: in bed;with call bell/phone within reach;with nursing/sitter in room Nurse Communication: Mobility status PT  Visit Diagnosis: Other abnormalities of gait and mobility (R26.89) Pain - part of body: (chest)     Time: 4332-9518 PT Time Calculation (min) (ACUTE ONLY): 32 min  Charges:  $Gait Training: 23-37 mins                     Earney Navy, PTA Acute Rehabilitation Services Pager: [phone] Office: [phone]     Darliss Cheney 03/10/2019, 9:42 AM

## 2019-03-10 NOTE — Progress Notes (Signed)
LVAD Coordinator Rounding Note:  Admitted 02/10/19 due to CHF from OSH .   HM3 LVAD implanted on 02/26/19 by PVT under DT criteria.  Pt sitting on side of bed working with PT this morning. States he feels "pretty good" this morning. Plan for thoracentesis today. See separate note for documentation.     Vital signs: Temp:  HR: 112 Doppler Pressure: 78 Automatic cuff: 89/68 (76) O2 Sat: 97% on RA Wt: 106.4>116.4>128>112>115>115.5>110.4>108.6 lbs   LVAD interrogation reveals:  Speed: 5500 Flow: 4.3 Power: 4.2 w PI: 3.2 Alarms: none Events:  0 PI events Hematocrit: 26  Fixed speed: 5500 Low speed limit: 5200   Drive Line: CDI. Anchor secure x 2. Every other day dressing changes per VAD Coordinator or Nurse Alla Feeling. Next dressing change due 03/11/19.   Labs:  LDH trend: 243>418>431>412>373>352>355>379>351>322>328  INR trend: 1.3>1.9>1.5>1.2>1.5>1.5>2.0>2.0>2.6>1.8  Wbc: 20.7>22.4>21.6  Anticoagulation Plan: -INR Goal: 2-2.5 -ASA Dose: 81 mg  Blood Products:  -Intra op: 2 u PRBCS 6 FFP 1 PLT  -Post-op: - 03/03/19>>1 unit PCs  Device: -N/A  Gtts: Milrinone 0.125 mcg/kg/min>>off Heparin 800 units/hr>>off  Infection: Urine cx 03/06/19>> No growth Blood cx 03/06/19>> NGTD  Respiratory: Extubated 02/27/19 03/01/19>>40% BiPAP added   03/01/19>>CT placed for right pnuemothorax 03/02/19>>O2 per [place] 03/03/19>>RA  Arrhythmia:  - 02/28/19>>Amiodarone 30 mg/hr started for atrial tachycardia>>off  Nitric Oxide:  03/02/19>>off   VAD Education: Plan to do further discharge education with patient and patient's mother tomorrow morning.     1. Pt has not started reading "HM III Patient Handbook" or discharge education binder. Discussed with bedside RN need to reinforce reading of binder.  2. Discussed daily log book and documenting daily weight, temperature, and VAD numbers.    Plan/Recommendations:  1. Page VAD coordinator w/any equipment issues or concerns or driveline  management questions. 2. Every other day dressing change per VAD coordinator or nurse champion.   Alyce Pagan RN VAD Coordinator  Office: [phone]  24/7 Pager: [phone]

## 2019-03-10 NOTE — Progress Notes (Signed)
OT Cancellation Note  Patient Details Name: Jon Martin MRN: [id number] DOB: 06/20/1985   Cancelled Treatment:    Reason Eval/Treat Not Completed: Fatigue/lethargy limiting ability to participate. Will continue efforts.  [name] 03/10/2019, 2:49 PM  [name], OTR/L Acute Rehabilitation Services Pager: [phone] Office: [phone]

## 2019-03-10 NOTE — Procedures (Signed)
PROCEDURE SUMMARY:  Successful US guided diagnostic and therapeutic left thoracentesis. Yielded 1.1 liters of amber fluid. Pt tolerated procedure well. No immediate complications.  Specimen was sent for labs. CXR ordered.  EBL < 5 mL  Hoyt Koch PA-C 03/10/2019 10:47 AM

## 2019-03-10 NOTE — Progress Notes (Addendum)
ANTICOAGULATION CONSULT NOTE  Pharmacy Consult for Warfarin Indication: LVAD  No Known Allergies  Patient Measurements: Height: 5\' 8"  (172.7 cm) Weight: 108 lb 11 oz (49.3 kg) IBW/kg (Calculated) : 68.4  Vital Signs: Temp: 100 F (37.8 C) (02/03 0317) Temp Source: Oral (02/03 0317) BP: 84/68 (02/03 0747)  Labs: Recent Labs    03/08/19 0357 03/08/19 0357 03/09/19 0210 03/09/19 1655 03/10/19 0400  HGB 8.9*   < > 8.7*  --  8.7*  HCT 27.6*  --  26.7*  --  26.4*  PLT 594*  --  627*  --  659*  LABPROT 22.5*  --  28.1*  --  20.8*  INR 2.0*  --  2.6*  --  1.8*  CREATININE 0.60*   < > 0.57* 0.64 0.58*   < > = values in this interval not displayed.    Estimated Creatinine Clearance: 91.6 mL/min (A) (by C-G formula based on SCr of 0.58 mg/dL (L)).   Medical History: Past Medical History:  Diagnosis Date  . Dilated cardiomyopathy (HCC)   . Dilated cardiomyopathy (HCC) 02/2019  . Polysubstance abuse [facility])     Assessment: 34 year old male with LVAD placed 1/22 on Warfarin therapy with goal INR 2.0 to 2.5. Patient has been on warfarin since 1/24. INR had been therapeutic on an average of 5mg  daily.   INR today decreased from 2.6 to 1.8, after holding warfarin dose on 2/2. Hgb 8.7, plt 659. LDH 328. No s/sx of bleeding. S/p thoracentesis today - removed 1.1 L. Starting fluconazole today.   Goal of Therapy:  INR goal 2.0 to 2.5 Monitor platelets by anticoagulation protocol: Yes   Plan:  Restart warfarin 5 mg tonight given INR drop with holding Monitor INR daily for now.  Monitor for any signs or symptoms of bleeding.   2/24, PharmD, BCCCP Clinical Pharmacist  Phone: [phone]  Please check AMION for all [facility] Pharmacy phone numbers After 10:00 PM, call Main Pharmacy [phone] 03/10/2019,8:54 AM

## 2019-03-10 NOTE — Progress Notes (Signed)
OT Cancellation Note  Patient Details Name: Jon Martin MRN: [id number] DOB: 09-02-1985   Cancelled Treatment:    Reason Eval/Treat Not Completed: Pain limiting ability to participate(Pt just returned from thoracentesis. Will follow.)  [name] 03/10/2019, 12:20 PM  [name], OTR/L Acute Rehabilitation Services Pager: [phone] Office: [phone]

## 2019-03-11 ENCOUNTER — Inpatient Hospital Stay (HOSPITAL_COMMUNITY): Payer: Medicaid - Out of State

## 2019-03-11 ENCOUNTER — Encounter (HOSPITAL_COMMUNITY): Payer: Self-pay

## 2019-03-11 ENCOUNTER — Inpatient Hospital Stay (HOSPITAL_COMMUNITY): Payer: Medicaid - Out of State | Admitting: Internal Medicine

## 2019-03-11 DIAGNOSIS — I509 Heart failure, unspecified: Secondary | ICD-10-CM

## 2019-03-11 LAB — LACTATE DEHYDROGENASE: LDH: 311 U/L — ABNORMAL HIGH (ref 98–192)

## 2019-03-11 LAB — CULTURE, BLOOD (ROUTINE X 2)
Culture: NO GROWTH
Culture: NO GROWTH
Special Requests: ADEQUATE
Special Requests: ADEQUATE

## 2019-03-11 LAB — CBC
HCT: 27.2 % — ABNORMAL LOW (ref 39.0–52.0)
Hemoglobin: 8.6 g/dL — ABNORMAL LOW (ref 13.0–17.0)
MCH: 30.1 pg (ref 26.0–34.0)
MCHC: 31.6 g/dL (ref 30.0–36.0)
MCV: 95.1 fL (ref 80.0–100.0)
Platelets: 667 10*3/uL — ABNORMAL HIGH (ref 150–400)
RBC: 2.86 MIL/uL — ABNORMAL LOW (ref 4.22–5.81)
RDW: 14.3 % (ref 11.5–15.5)
WBC: 23.2 10*3/uL — ABNORMAL HIGH (ref 4.0–10.5)
nRBC: 0 % (ref 0.0–0.2)

## 2019-03-11 LAB — BASIC METABOLIC PANEL
Anion gap: 8 (ref 5–15)
BUN: 8 mg/dL (ref 6–20)
CO2: 23 mmol/L (ref 22–32)
Calcium: 8.6 mg/dL — ABNORMAL LOW (ref 8.9–10.3)
Chloride: 100 mmol/L (ref 98–111)
Creatinine, Ser: 0.62 mg/dL (ref 0.61–1.24)
GFR calc Af Amer: >60 mL/min (ref >60)
GFR calc non Af Amer: >60 mL/min (ref >60)
Glucose, Bld: 134 mg/dL — ABNORMAL HIGH (ref 70–99)
Potassium: 3.5 mmol/L (ref 3.5–5.1)
Sodium: 131 mmol/L — ABNORMAL LOW (ref 135–145)

## 2019-03-11 LAB — PROTIME-INR
INR: 2.1 — ABNORMAL HIGH (ref 0.8–1.2)
Prothrombin Time: 23.3 seconds — ABNORMAL HIGH (ref 11.4–15.2)

## 2019-03-11 LAB — COOXEMETRY PANEL
Carboxyhemoglobin: 1.7 % — ABNORMAL HIGH (ref 0.5–1.5)
Methemoglobin: 1.1 % (ref 0.0–1.5)
O2 Saturation: 69.6 %
Total hemoglobin: 8.7 g/dL — ABNORMAL LOW (ref 12.0–16.0)

## 2019-03-11 LAB — MAGNESIUM: Magnesium: 1.6 mg/dL — ABNORMAL LOW (ref 1.7–2.4)

## 2019-03-11 MED ORDER — POTASSIUM CHLORIDE CRYS ER 20 MEQ PO TBCR
40.0000 meq | EXTENDED_RELEASE_TABLET | Freq: Once | ORAL | Status: AC
  Administered 2019-03-11: 09:00:00 40 meq via ORAL
  Filled 2019-03-11: qty 2

## 2019-03-11 MED ORDER — GABAPENTIN 100 MG PO CAPS
100.0000 mg | ORAL_CAPSULE | Freq: Two times a day (BID) | ORAL | Status: DC
  Administered 2019-03-11 – 2019-03-15 (×9): 100 mg via ORAL
  Filled 2019-03-11 (×9): qty 1

## 2019-03-11 MED ORDER — WARFARIN SODIUM 1 MG PO TABS
1.0000 mg | ORAL_TABLET | Freq: Once | ORAL | Status: AC
  Administered 2019-03-11: 1 mg via ORAL
  Filled 2019-03-11: qty 1

## 2019-03-11 MED ORDER — MAGNESIUM SULFATE 2 GM/50ML IV SOLN
2.0000 g | Freq: Once | INTRAVENOUS | Status: AC
  Administered 2019-03-11: 2 g via INTRAVENOUS
  Filled 2019-03-11: qty 50

## 2019-03-11 NOTE — Progress Notes (Signed)
CARDIAC REHAB PHASE I   PRE:  Rate/Rhythm: 122 ST    BP: sitting 78    SaO2: 93 RA  MODE:  Ambulation: 800 ft   POST:  Rate/Rhythm: 135 ST    BP: sitting 80     SaO2: 96 RA  Pt able to don batteries independently. Able to stand and walk. No c/o except coughing, which is increased since thoracentesis. He is really unable to talk much without coughing. Return to EOB, doffed batteries. He did stand and drop battery suddenly. Reminded him of sternal precautions, watching lines, holding pillow when coughing. [phone]  Harriet Masson CES, ACSM 03/11/2019 3:26 PM

## 2019-03-11 NOTE — Progress Notes (Signed)
OT Cancellation Note  Patient Details Name: Jon Martin MRN: [id number] DOB: December 17, 1985   Cancelled Treatment:    Reason Eval/Treat Not Completed: Patient at procedure or test/ unavailable. Attempted x 3. Pt receiving d/c teaching, with SW and later with cardiac rehab. Pt to d/c Monday or Tues next week. Will continue to follow.  [name] 03/11/2019, 3:11 PM  [name], OTR/L Acute Rehabilitation Services Pager: [phone] Office: [phone]

## 2019-03-11 NOTE — Progress Notes (Signed)
13 Days Post-Op Procedure(s) (LRB): INSERTION OF IMPLANTABLE LEFT VENTRICULAR ASSIST DEVICE (N/A) TRANSESOPHAGEAL ECHOCARDIOGRAM (TEE) (N/A) Video Bronchoscopy (N/A) Subjective:  CXR clear after thoracentesis. O2 sat .95 on RA Pleural fluid gram stain rare WBC - no bacteria Sinus tach, MAP in good range WBC pending Objective: Vital signs in last 24 hours: Temp:  [97.8 F (36.6 C)-98.8 F (37.1 C)] 98.8 F (37.1 C) (02/04 0748) Pulse Rate:  [40-116] 56 (02/04 0748) Cardiac Rhythm: Sinus tachycardia (02/04 0700) Resp:  [15-26] 17 (02/04 0748) BP: (83-90)/(55-70) 85/61 (02/04 0321) SpO2:  [95 %-99 %] 96 % (02/04 0748) Weight:  [49.6 kg] 49.6 kg (02/04 0321)  Hemodynamic parameters for last 24 hours: CVP:  [3 mmHg-10 mmHg] 6 mmHg  Intake/Output from previous day: 02/03 0701 - 02/04 0700 In: 250 [IV Piggyback:250] Out: 825 [Urine:825] Intake/Output this shift: No intake/output data recorded.       Exam    General- alert and comfortable    Neck- no JVD, no cervical adenopathy palpable, no carotid bruit   Lungs- clear without rales, wheezes   Cor- regular rate and rhythm, no murmur , gallop, normal       VAD hum   Abdomen- soft, non-tender   Extremities - warm, non-tender, minimal edema   Neuro- oriented, appropriate, no focal weakness   Lab Results: Recent Labs    03/09/19 0210 03/10/19 0400  WBC 22.4* 21.6*  HGB 8.7* 8.7*  HCT 26.7* 26.4*  PLT 627* 659*   BMET:  Recent Labs    03/10/19 1800 03/11/19 0500  NA 132* 131*  K 3.9 3.5  CL 99 100  CO2 24 23  GLUCOSE 103* 134*  BUN 6 8  CREATININE 0.60* 0.62  CALCIUM 9.0 8.6*    PT/INR:  Recent Labs    03/11/19 0500  LABPROT 23.3*  INR 2.1*   ABG    Component Value Date/Time   PHART 7.549 (H) 03/02/2019 0358   HCO3 26.5 03/02/2019 0358   TCO2 27 03/02/2019 0358   ACIDBASEDEF 0.5 02/28/2019 1740   O2SAT 69.6 03/11/2019 0500   CBG (last 3)  No results for input(s): GLUCAP in the last 72  hours.  Assessment/Plan: S/P Procedure(s) (LRB): INSERTION OF IMPLANTABLE LEFT VENTRICULAR ASSIST DEVICE (N/A) TRANSESOPHAGEAL ECHOCARDIOGRAM (TEE) (N/A) Video Bronchoscopy (N/A) Stop iv antibiotics and observe 48 hrs before DC CXR, CBC in am   LOS: 30 days    Jon Martin 03/11/2019

## 2019-03-11 NOTE — Progress Notes (Signed)
VAD Discharge Teaching Note:  Discharge VAD teaching completed with patient and his mother Alinda Sierras.   Both patient and caregiver have been trained on the following:  1. HM III LVAD overview of system operations  2. Overview of major lifestyle accommodations and cautions   3. Overview of system components (features and functions) 4. Changing power sources 5. Overview of alerts and alarms 6. How to identify and manage an emergency including when pump is running and when pump has stopped  7. Changing system controller 8. Maintain emergency contact list and medications  The patient and caregiver(s) have successfully demonstrated:  1. Changing power source (from batteries to mobile power unit, mobile power unit to batteries, and replacing batteries) 2. Perform system controller self test  3. Check and charge batteries  4. Change system controller 5. Paged VAD pager and programmed number in phones  A daily flow sheet with patient weight, temperature,  flow, speed, power, and PI, along with daily self checks on system controller and power module have been performed by patient and caregiver(s) during hospitalization and will also be done daily at home.   The caregiver(s) has been trained on percutaneous lead exit site care, care of the driveline and dressing changes. They have performed dressing changes during patient's hospitalization under my supervision with the support of the nursing staff. The importance of lead immobilization has been stressed to patient and caregiver(s) using the attachment device.   Alinda Sierras has demonstrated the following: 1. Cleansing site with sterile technique 2. Dressing care and maintenance  3. Immobilizing driveline  She will need to be supervised with dressing change a few more times prior to discharge.   The following routine activities and maintenance have been reviewed with patient and caregiver(s) and both verbalize understanding:  1. Stressed importance of  never disconnecting power from both controller power leads at the same time, and never disconnecting both batteries at the same time, or the pump will stop 2. Plug the mobile power unit (MPU) and the universal battery charger (UBC) into properly grounded (3 prong) outlets dedicated to PM use. Do NOT use adapter (cheater plug) for ungrounded outlets or multiple portable socket outlets (power strips) 3. Do not connect the PM or MPU to an outlet controlled by wall switch or the device may not work 4. Transfer from MPU to batteries during [employer] mains power failure.  5. Keep a backup system controller, charged batteries, battery clips, and flashlight near you during sleep in case of electrical power outage 6. Clean battery, battery clip, and universal battery charger contacts weekly 7. Visually inspect percutaneous lead daily 8. Check cables and connectors when changing power source  9. Rotate batteries; keep all eight batteries charged 10. Always have backup system controller, battery clips, fully charged batteries, and spare fully charged batteries when traveling 11. Re-calibrate batteries every 70 uses; monitor battery life of 36 months or 360 uses; replace batteries at end of battery life  Identified the following changes in activities of daily living with pump:  1. No driving for at least six weeks and then only if doctor gives permission to do so 2. No tub baths while pump implanted, and shower only if doctor gives permission 3. No swimming or submersion in water while implanted with pump 4. Keep all VAD equipment away from water or moisture 5. Keep all VAD connections clean and dry 6. No contact sports or engage in jumping activities 7. Avoid strong static electricity (touching TV/computer screens, vacuuming) 8. Never have an  MRI while implanted with the pump 9. Never leave or store batteries in extremely hot or cold places (such as   trunk of your car), or the battery life will be  shortened 10. Call the doctor or hospital contact person if any change in how the pump sounds, feels, or works 11. Plan to sleep only when connected to the power module. 12. Keep a backup system controller, charged batteries, battery clips, and flashlight near you during sleep in case of electrical power outage 13. Do not sleep on your stomach 14. Talk with doctor before any long distance travel plans 15. Patient will need antibiotics prior to any dental procedure; instructed to contact VAD coordinator before any dental procedures (including routine cleaning)   Discharge binder given to patient and include the following: 1. List of emergency contacts 2. Wallet card 3. HM III Luggage tags 4. HM III Alarms for Patients and their Caregivers 5. HM III Patient Handbook 6. HM III Patient Education Program DVD 7. Daily home log sheets 8. Warfarin teaching sheets 9. Nosebleed teaching sheets 10. Medications you may and may not take with CHF list   Discussed frequency and importance of INR checks; emphasized importance of maintaining INR goal to prevent clotting and or bleeding issues with pump.  Able to answer questions and asked good questions pertaining to warfarin and diet/lifestyle changes necessary to be successful and safe. Patient will have INR managed with the assistance of Home Health PT/OT. Current INR goal is 2.0 - 2.5.    [name] and [name] have completed a proficiency test for the HM III and all questions have been answered. The pt and family have been instructed to call if any questions, problems, or concerns arise.   Pt and caregiver successfully paged VAD coordinator using VAD pager emergency number and have been instructed to use this number only for emergencies.  Patient and caregiver(s) asked appropriate questions, had good interaction with VAD coordinator, and verbalized understanding of above instructions. Reinforced that at each clinic visit they will bring medications/list of  medications and log book entries of parameters/weights.   Alyce Pagan RN VAD Coordinator  Office: [phone]  24/7 Pager: [phone]

## 2019-03-11 NOTE — Progress Notes (Addendum)
Patient ID: Nyan Dufresne, male   DOB: [birth year], 34 y.o.   MRN: [id number]     Advanced Heart Failure Rounding Note  PCP-Cardiologist: No primary care provider on file.   Subjective:    34 y.o. 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  Underwent HM-3 placement 02/26/2019. Ramp ECHO completed 2/1. Continue speed at 5500.   Extubated 1/23. CXR 1/25 w/ new moderate size right pneumothorax s/p CT placement. CT removed 1/28   CXR with L pleural effusion. 03/10/19 S/P Thoracentesis 1.1 liters.    Now on vancomycin + fluconazole.   WBC  22.4 -> 21.6 >   Hgb improved after transfusion, received 1 unit PRBCs on 1/27.   Feeling better. Denies SOB.   LVAD Interrogation HM 3: Speed: 5500 Flow 4.5  PI: 3 Power:4. Rare PI events VAD interrogated personally. Parameters stable.  Objective:   Weight Range: 49.6 kg Body mass index is 16.63 kg/m.   Vital Signs:  Maps 70-80s   Temp:  [97.8 F (36.6 C)-98.8 F (37.1 C)] 98.8 F (37.1 C) (02/04 0748) Pulse Rate:  [40-116] 56 (02/04 0748) Resp:  [15-26] 17 (02/04 0748) BP: (83-90)/(55-70) 85/61 (02/04 0321) SpO2:  [95 %-99 %] 96 % (02/04 0748) Weight:  [49.6 kg] 49.6 kg (02/04 0321) Last BM Date: 03/08/19  Weight change: Filed Weights   03/09/19 1400 03/10/19 0317 03/11/19 0321  Weight: 52.4 kg 49.3 kg 49.6 kg    Intake/Output:   Intake/Output Summary (Last 24 hours) at 03/11/2019 2353 Last data filed at 03/11/2019 0600 Gross per 24 hour  Intake 250 ml  Output 825 ml  Net -575 ml      Physical Exam  CVP 7 personally checked  Physical Exam: GENERAL: No acute distress. HEENT: normal  NECK: Supple, JVP 6-7 .  2+ bilaterally, no bruits.  No lymphadenopathy or thyromegaly appreciated.   CARDIAC:  Mechanical heart sounds with LVAD hum present.  LUNGS:  Clear to auscultation bilaterally.  ABDOMEN:  Soft, round, nontender, positive bowel sounds x4.     LVAD exit site:  Dressing dry and intact.  No  erythema or drainage.  Stabilization device present and accurately applied.  Driveline dressing is being changed daily per sterile technique. EXTREMITIES:  Warm and dry, no cyanosis, clubbing, rash or edema . RUE PICC NEUROLOGIC:  Alert and oriented x 4.  No aphasia.  No dysarthria.  Affect pleasant.      Telemetry  Sinus Tach 100-110s personally checked  Labs    CBC Recent Labs    03/09/19 0210 03/10/19 0400  WBC 22.4* 21.6*  HGB 8.7* 8.7*  HCT 26.7* 26.4*  MCV 93.0 91.7  PLT 627* 614*   Basic Metabolic Panel Recent Labs    03/10/19 1800 03/11/19 0500  NA 132* 131*  K 3.9 3.5  CL 99 100  CO2 24 23  GLUCOSE 103* 134*  BUN 6 8  CREATININE 0.60* 0.62  CALCIUM 9.0 8.6*  MG  --  1.6*   Liver Function Tests No results for input(s): AST, ALT, ALKPHOS, BILITOT, PROT, ALBUMIN in the last 72 hours. No results for input(s): LIPASE, AMYLASE in the last 72 hours. Cardiac Enzymes No results for input(s): CKTOTAL, CKMB, CKMBINDEX, TROPONINI in the last 72 hours.  BNP: BNP (last 3 results) Recent Labs    02/10/19 0219 02/27/19 0433 03/05/19 0056  BNP 2,048.6* 447.0* 427.2*    ProBNP (last 3 results) No results for input(s): PROBNP in the last 8760 hours.  D-Dimer No results for input(s): DDIMER in the last 72 hours. Hemoglobin A1C No results for input(s): HGBA1C in the last 72 hours. Fasting Lipid Panel No results for input(s): CHOL, HDL, LDLCALC, TRIG, CHOLHDL, LDLDIRECT in the last 72 hours. Thyroid Function Tests No results for input(s): TSH, T4TOTAL, T3FREE, THYROIDAB in the last 72 hours.  Invalid input(s): FREET3  Other results:   Imaging    DG Chest 1 View  Result Date: 03/10/2019 CLINICAL DATA:  Status post thoracentesis. EXAM: CHEST  1 VIEW COMPARISON:  March 09, 2019. FINDINGS: Stable cardiomegaly. Left ventricular assist device is unchanged. Right-sided PICC line is unchanged. No pneumothorax is noted. Right lung is clear. Left pleural effusion  is significantly smaller. Bony thorax is unremarkable. IMPRESSION: No pneumothorax status post thoracentesis. Left pleural effusion is significantly smaller. Electronically Signed   By: Lupita Raider M.D.   On: 03/10/2019 10:38   IR THORACENTESIS ASP PLEURAL SPACE W/IMG GUIDE  Result Date: 03/10/2019 INDICATION: Patient status post recent LVAD placement, now with left pleural effusion. Request is made for diagnostic and therapeutic left thoracentesis. EXAM: ULTRASOUND GUIDED DIAGNOSTIC AND THERAPEUTIC LEFT THORACENTESIS MEDICATIONS: 10 mL 1% lidocaine COMPLICATIONS: None immediate. PROCEDURE: An ultrasound guided thoracentesis was thoroughly discussed with the patient and questions answered. The benefits, risks, alternatives and complications were also discussed. The patient understands and wishes to proceed with the procedure. Written consent was obtained. Ultrasound was performed to localize and mark an adequate pocket of fluid in the left chest. The area was then prepped and draped in the normal sterile fashion. 1% Lidocaine was used for local anesthesia. Under ultrasound guidance a 6 Fr Safe-T-Centesis catheter was introduced. Thoracentesis was performed. The catheter was removed and a dressing applied. FINDINGS: A total of approximately 1.1 liters of amber fluid was removed. Samples were sent to the laboratory as requested by the clinical team. IMPRESSION: Successful ultrasound guided diagnostic and therapeutic left thoracentesis yielding 1.1 liters of pleural fluid. Read by: Loyce Dys PA-C Electronically Signed   By: Simonne Come M.D.   On: 03/10/2019 10:58     Medications:     Scheduled Medications: . aspirin EC  81 mg Oral Daily  . bisacodyl  10 mg Oral Daily   Or  . bisacodyl  10 mg Rectal Daily  . Chlorhexidine Gluconate Cloth  6 each Topical Daily  . chlorpheniramine-HYDROcodone  5 mL Oral Q12H  . digoxin  0.125 mg Oral Daily  . docusate sodium  200 mg Oral Daily  . fluconazole   100 mg Oral Daily  . furosemide  80 mg Oral Daily  . guaiFENesin  600 mg Oral BID  . lactose free nutrition  237 mL Oral TID WC & HS  . mouth rinse  15 mL Mouth Rinse BID  . pantoprazole  40 mg Oral Daily  . potassium chloride  40 mEq Oral Once  . sildenafil  20 mg Oral TID  . sodium chloride flush  10 mL Intravenous Q12H  . spironolactone  25 mg Oral Daily  . Warfarin - Pharmacist Dosing Inpatient   Does not apply q1800    Infusions: . sodium chloride Stopped (03/09/19 1500)  . magnesium sulfate bolus IVPB    . vancomycin Stopped (03/11/19 0509)    PRN Medications: sodium chloride, hydrALAZINE, lidocaine (PF), ondansetron (ZOFRAN) IV, oxyCODONE, sodium chloride flush, traMADol     Assessment/Plan   1. Acute/Chronic Systolic HF -> cardiogenic shock - ECHO at [facility] EF, 20%.  - ECHO repeated and showed  severely reduced EF <20%. RV only mildly down - R/L cath 1/7 normal cors. Output ok   - s/p HM-3 on 02/26/2019 - Ramp echo completed - no change in speed.   - CO-OX 70%  -. Continue digoxin 0.125 mg daily, dig level 0.2 on 03/06/19 - CVP 6-7. Volume status stable. Continue lasix 80 mg daily.  - Continue spiro to 25 mg daily.   - Renal function stable.   - VAD interrogated personally. Parameters stable.   2. VAD -  s/p HM-3 on 02/26/2019 -  RV down some.  - Stable off milrinone. CO-OX 70%  -  VAD interrogated personally. Parameters stable. - Continue sildenafil 20 mg three times a day.  - LDH stable 311  - Hold coumadin with pending thoracentesis.  - Continue ASA 81 - INR therapeutic 2.1 . INR Goal 2-2.5 - Ramp ECHO completed 03/07/18. Continue Speed 5500.   3. Acute post-op respiratory failure with RUL PNA - Extubated 1/23.  - on fluconazole+ vancomycin - CBC pending  - .     4. Acute blood loss anemia - keep HGB > 8.0 - s/p transfusion x 1 unit 1/27 for hgb of 7.6.  - Hgb has been trending down 9.7>9.1>8.9>8.7 >8.7 > CBC pending.  - No obvious source.   5.  Elevated bilirubin - Resolved.   6. Moderate size right pneumothorax: new on CXR 1/25.  - s/p placement for Rt sided CT. F/u CXR showed complete resolution of apical pneumothorax.  - management per CT surgery   7. Hypokalemia/ Hypomangesemia: - Keep K >4.0 and Mg > 2.0   -K 3.5 - Supp K . Give 40 meq potassium.  - Mag 1.6. Give 2 grams Mag.   8. Deconditioning: - continue to ambulate w/ PT.  - May need HHPT   9. Left Pleural Effusion  S/P Thoracentesis 2/3 with 1.1 liters removed.  CXR today.      Length of Stay: 30  Amy Clegg, NP  03/11/2019, 8:07 AM  Advanced Heart Failure Team Pager [phone] (M-F; 7a - 4p)  Please contact [facility] for night-coverage after hours (4p -7a ) and weekends on [email or web address]  Patient seen and examined with the above-signed Advanced Practice Provider and/or Housestaff. I personally reviewed laboratory data, imaging studies and relevant notes. I independently examined the patient and formulated the important aspects of the plan. I have edited the note to reflect any of my changes or salient points. I have personally discussed the plan with the patient and/or family.  Had thoracentesis yesterday with 800cc out. GS ok. No fevers or chills. Feels breathing is better. Abx now off. On fluconazole. CVP and co-ox ok. CXR clear (viewed personally). K and Mg low.   General:  Lying in bed NAD.  HEENT: normal  Neck: supple. JVP not elevated.  Carotids 2+ bilat; no bruits. No lymphadenopathy or thryomegaly appreciated. Cor: LVAD hum.  Lungs: Clear. Abdomen: soft, nontender, non-distended. No hepatosplenomegaly. No bruits or masses. Good bowel sounds. Driveline site clean. Anchor in place.  Extremities: no cyanosis, clubbing, rash. Warm no edema  Neuro: alert & oriented x 3. No focal deficits. Moves all 4 without problem   Respiratory status improved but still with some pocket/side pain. WBC elevated. On fluconazole. Co-ox and volumestatus ok. INR 2.1.  Discussed dosing with PharmD personally. VAD interrogated personally. Parameters stable.   Will start neurontin. Continue fluconazole. Mobilize. Watch respiratory status.   Arvilla Meres, MD  9:07 AM

## 2019-03-11 NOTE — Progress Notes (Signed)
RN notified that patient cell phone could  not be found. Patient bed had been recently changed by tech. Tech asked about seeing the phone and tech stated "didn't see anything in sheets and linen bag had already been put down shoot." Patient had two cell phones in the both where last seen by patient on bed before going to bathroom. Email sent with patients permission to laundry to Catalina Lunger in a attempt to locate phone. RN will continue to monitor.

## 2019-03-11 NOTE — Progress Notes (Addendum)
LVAD Coordinator Rounding Note:  Admitted 02/10/19 due to CHF from OSH .   HM3 LVAD implanted on 02/26/19 by PVT under DT criteria.  Pt sitting on side of bed. Says he is feeling good this morning. Still has persistent cough post thoracentesis yesterday. Plan for discharge teaching today with patient and his mother Jon Martin; see separate note for documentation.   Vital signs: Temp: 98.8 HR: 112 Doppler Pressure: 78 Automatic cuff: 98/78 (86) O2 Sat: 98% on RA Wt: 106.4>116.4>128>112>115>115.5>110.4>108.6>109.3 lbs   LVAD interrogation reveals:  Speed: 5500 Flow: 4.5 Power: 4.0 w PI: 2.2 Alarms: none Events:  0 PI events Hematocrit: 26  Fixed speed: 5500 Low speed limit: 5200   Drive Line: CDI. Anchor secure x 2. Every other day dressing changes per VAD Coordinator or Nurse Alla Feeling. Next dressing change due 03/13/19. Dressing change performed today by patient's mother Jon Martin with VAD coordinator supervision. Existing VAD dressing removed and site care performed using sterile technique. Drive line exit site cleaned with Chlora prep applicators x 2, allowed to dry, andgauze dressing withsilver stripre-applied. Proximal suture intact,the velour is fully implanted at exit site. No drainage, redness, tenderness, foul odor or rash noted. Drive line anchor intact x 2. Continue every other day dressing changes. Jon Martin will need to be supervised completing dressing changes. Provided with extra sterile gloves for her to take home to practice don/doff. Also provided with printed dressing change instructions.      Counter incision CDI.   Labs:  LDH trend: 243>418>431>412>373>352>355>379>351>322>328>311  INR trend: 1.3>1.9>1.5>1.2>1.5>1.5>2.0>2.0>2.6>1.8>2.1  Wbc: 20.7>22.4>21.6>23.2  Anticoagulation Plan: -INR Goal: 2-2.5 -ASA Dose: 81 mg  Blood Products:  -Intra op: 2 u PRBCS 6 FFP 1 PLT  -Post-op: - 03/03/19>>1 unit PCs  Device: -N/A  Gtts: Milrinone 0.125  mcg/kg/min>>off Heparin 800 units/hr>>off  Infection: Urine cx 03/06/19>> No growth Blood cx 03/06/19>> NGTD  Respiratory: Extubated 02/27/19 03/01/19>>40% BiPAP added   03/01/19>>CT placed for right pnuemothorax 03/02/19>>O2 per [name] 03/03/19>>RA  Arrhythmia:  - 02/28/19>>Amiodarone 30 mg/hr started for atrial tachycardia>>off  Nitric Oxide:  03/02/19>>off   VAD Education: See separate discharge teaching note.   Plan/Recommendations:  1. Page VAD coordinator w/any equipment issues or concerns or driveline management questions. 2. Every other day dressing change per VAD coordinator or nurse champion. Next dressing change due 03/13/19.   Alyce Pagan RN VAD Coordinator  Office: [phone]  24/7 Pager: [phone]

## 2019-03-11 NOTE — Progress Notes (Signed)
ANTICOAGULATION CONSULT NOTE  Pharmacy Consult for Warfarin Indication: LVAD  No Known Allergies  Patient Measurements: Height: 5\' 8"  (172.7 cm) Weight: 109 lb 5.6 oz (49.6 kg)(b) IBW/kg (Calculated) : 68.4  Vital Signs: Temp: 98.8 F (37.1 C) (02/04 0748) Temp Source: Axillary (02/04 0748) BP: 85/61 (02/04 0321) Pulse Rate: 56 (02/04 0748)  Labs: Recent Labs    03/09/19 0210 03/09/19 1655 03/10/19 0400 03/10/19 1800 03/11/19 0500  HGB 8.7*  --  8.7*  --   --   HCT 26.7*  --  26.4*  --   --   PLT 627*  --  659*  --   --   LABPROT 28.1*  --  20.8*  --  23.3*  INR 2.6*  --  1.8*  --  2.1*  CREATININE 0.57*   < > 0.58* 0.60* 0.62   < > = values in this interval not displayed.    Estimated Creatinine Clearance: 92.1 mL/min (by C-G formula based on SCr of 0.62 mg/dL).   Medical History: Past Medical History:  Diagnosis Date  . Dilated cardiomyopathy (HCC)   . Dilated cardiomyopathy (HCC) 02/2019  . Polysubstance abuse [facility] [employer])     Assessment: 34 year old male with LVAD placed 1/22 on Warfarin therapy with goal INR 2.0 to 2.5. Patient has been on warfarin since 1/24. INR had been therapeutic on an average of 5mg  daily.   INR today 2.1 ago goal - has been onn/off warfarin for thoracentesis 1.1 L removed and added fluconazole for 3 days.  No bleeding  after holding warfarin dose on 2/2. Hgb stable in 8s plt 659. LDH 300s stable.   Goal of Therapy:  INR goal 2.0 to 2.5 Monitor platelets by anticoagulation protocol: Yes   Plan:  Restart warfarin 1 mg tonight  Monitor INR daily for now.  Monitor for any signs or symptoms of bleeding.   2/24 Pharm.D. CPP, BCPS Clinical Pharmacist [phone] 03/11/2019 9:09 AM    Please check AMION for all [facility] phone numbers After 10:00 PM, call Main Pharmacy [phone] 03/11/2019,9:07 AM

## 2019-03-11 NOTE — Progress Notes (Signed)
CSW met at bedside with patient who shared about teaching class with his mom. Patient happy to have a visit from hi mom as well. CSW discussed need for active cell phone as patient's current phone is without minutes along with his Mom's phone. CSW discussed free government phone and completed online application. Will await word from wireless company about approval. CSW continues to follow throughout implant hospitalization. [name], [place], [place]

## 2019-03-12 ENCOUNTER — Inpatient Hospital Stay (HOSPITAL_COMMUNITY): Payer: Medicaid - Out of State

## 2019-03-12 LAB — CBC
HCT: 25.4 % — ABNORMAL LOW (ref 39.0–52.0)
Hemoglobin: 8.2 g/dL — ABNORMAL LOW (ref 13.0–17.0)
MCH: 30.1 pg (ref 26.0–34.0)
MCHC: 32.3 g/dL (ref 30.0–36.0)
MCV: 93.4 fL (ref 80.0–100.0)
Platelets: 642 10*3/uL — ABNORMAL HIGH (ref 150–400)
RBC: 2.72 MIL/uL — ABNORMAL LOW (ref 4.22–5.81)
RDW: 14.3 % (ref 11.5–15.5)
WBC: 21.1 10*3/uL — ABNORMAL HIGH (ref 4.0–10.5)
nRBC: 0 % (ref 0.0–0.2)

## 2019-03-12 LAB — BASIC METABOLIC PANEL
Anion gap: 7 (ref 5–15)
BUN: 9 mg/dL (ref 6–20)
CO2: 25 mmol/L (ref 22–32)
Calcium: 8.6 mg/dL — ABNORMAL LOW (ref 8.9–10.3)
Chloride: 100 mmol/L (ref 98–111)
Creatinine, Ser: 0.62 mg/dL (ref 0.61–1.24)
GFR calc Af Amer: >60 mL/min (ref >60)
GFR calc non Af Amer: >60 mL/min (ref >60)
Glucose, Bld: 119 mg/dL — ABNORMAL HIGH (ref 70–99)
Potassium: 3.5 mmol/L (ref 3.5–5.1)
Sodium: 132 mmol/L — ABNORMAL LOW (ref 135–145)

## 2019-03-12 LAB — COOXEMETRY PANEL
Carboxyhemoglobin: 1.3 % (ref 0.5–1.5)
Methemoglobin: 0.7 % (ref 0.0–1.5)
O2 Saturation: 63.3 %
Total hemoglobin: 7.8 g/dL — ABNORMAL LOW (ref 12.0–16.0)

## 2019-03-12 LAB — PROTIME-INR
INR: 1.9 — ABNORMAL HIGH (ref 0.8–1.2)
Prothrombin Time: 21.9 seconds — ABNORMAL HIGH (ref 11.4–15.2)

## 2019-03-12 LAB — LACTATE DEHYDROGENASE: LDH: 304 U/L — ABNORMAL HIGH (ref 98–192)

## 2019-03-12 LAB — BRAIN NATRIURETIC PEPTIDE: B Natriuretic Peptide: 135.5 pg/mL — ABNORMAL HIGH (ref 0.0–100.0)

## 2019-03-12 MED ORDER — POTASSIUM CHLORIDE CRYS ER 20 MEQ PO TBCR
40.0000 meq | EXTENDED_RELEASE_TABLET | ORAL | Status: AC
  Administered 2019-03-12 (×2): 40 meq via ORAL
  Filled 2019-03-12 (×2): qty 2

## 2019-03-12 MED ORDER — WARFARIN SODIUM 4 MG PO TABS
4.0000 mg | ORAL_TABLET | Freq: Once | ORAL | Status: AC
  Administered 2019-03-12: 17:00:00 4 mg via ORAL
  Filled 2019-03-12: qty 1

## 2019-03-12 NOTE — Progress Notes (Addendum)
Patient ID: Jon Martin, male   DOB: 01/13/1986, 34 y.o.   MRN: [id number]     Advanced Heart Failure Rounding Note  PCP-Cardiologist: No primary care provider on file.   Subjective:    34 y.o. 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  Underwent HM-3 placement 02/26/2019. Ramp ECHO completed 2/1. Continue speed at 5500.   Extubated 1/23. CXR 1/25 w/ new moderate size right pneumothorax s/p CT placement. CT removed 1/28   Developed L pleural effusion, s/p Thoracentesis 2/3 w/ 1.1 liters out. GS ok. CXR today w/ persistent small left pleural effusion.    On Vancomycin  + fluconazole.  WBC down trending, 23>>21   Hgb improved after transfusion, received 1 unit PRBCs on 1/27. Stable at 8.2  Co-ox 63% off milrinone. CVP 6-7. Wt stable. Breathing improved but CXR w/ development of mild pulmonary venous congestion. Pocket pain improved w/ gabapentin. INR 1.9.   LVAD Interrogation HM 3: Speed: 5500 Flow 4.4  PI: 3.2 Power:4.1 No PI events today VAD interrogated personally. Parameters stable.  Objective:   Weight Range: 49 kg Body mass index is 16.43 kg/m.   Vital Signs:  Maps 70-80s   Temp:  [98.3 F (36.8 C)-99.8 F (37.7 C)] 99.1 F (37.3 C) (02/05 0719) Pulse Rate:  [80-122] 91 (02/05 0312) Resp:  [16-23] 16 (02/05 0308) BP: (70-98)/(53-78) 93/53 (02/05 0400) SpO2:  [94 %-100 %] 99 % (02/05 0305) Weight:  [49 kg] 49 kg (02/05 0342) Last BM Date: 03/08/19  Weight change: Filed Weights   03/10/19 0317 03/11/19 0321 03/12/19 0342  Weight: 49.3 kg 49.6 kg 49 kg    Intake/Output:   Intake/Output Summary (Last 24 hours) at 03/12/2019 0819 Last data filed at 03/12/2019 0700 Gross per 24 hour  Intake 200 ml  Output 2500 ml  Net -2300 ml      Physical Exam   Physical Exam: CVP 6-7 personally checked  GENERAL: young, thin AAM. No respiratory distress HEENT: normal anicteric  NECK: Supple, elevated JVD to jawline .  2+ bilaterally, no  bruits.  No lymphadenopathy or thyromegaly appreciated.   CARDIAC:  + LVAD HUM.  LUNGS:  Clear to auscultation bilaterally.  No wheeze ABDOMEN:  Soft,  Nontender, abdominal binder in place    LVAD exit site:  Dressing dry and intact.  No erythema or drainage. Abdominal binder on to help w/ stabilization.  EXTREMITIES: + RUE PICC.  Thin LEs. Warm and dry, no cyanosis, clubbing, rash or edema .  Neuro: alert & oriented x 3, cranial nerves grossly intact. moves all 4 extremities w/o difficulty. Affect pleasant   Telemetry   Sinus Tach low 100s-110s personally checked   Labs    CBC Recent Labs    03/11/19 0500 03/12/19 0500  WBC 23.2* 21.1*  HGB 8.6* 8.2*  HCT 27.2* 25.4*  MCV 95.1 93.4  PLT 667* 642*   Basic Metabolic Panel Recent Labs    52/84/13 0500 03/12/19 0500  NA 131* 132*  K 3.5 3.5  CL 100 100  CO2 23 25  GLUCOSE 134* 119*  BUN 8 9  CREATININE 0.62 0.62  CALCIUM 8.6* 8.6*  MG 1.6*  --    Liver Function Tests No results for input(s): AST, ALT, ALKPHOS, BILITOT, PROT, ALBUMIN in the last 72 hours. No results for input(s): LIPASE, AMYLASE in the last 72 hours. Cardiac Enzymes No results for input(s): CKTOTAL, CKMB, CKMBINDEX, TROPONINI in the last 72 hours.  BNP: BNP (last 3  results) Recent Labs    02/27/19 0433 03/05/19 0056 03/12/19 0542  BNP 447.0* 427.2* 135.5*    ProBNP (last 3 results) No results for input(s): PROBNP in the last 8760 hours.   D-Dimer No results for input(s): DDIMER in the last 72 hours. Hemoglobin A1C No results for input(s): HGBA1C in the last 72 hours. Fasting Lipid Panel No results for input(s): CHOL, HDL, LDLCALC, TRIG, CHOLHDL, LDLDIRECT in the last 72 hours. Thyroid Function Tests No results for input(s): TSH, T4TOTAL, T3FREE, THYROIDAB in the last 72 hours.  Invalid input(s): FREET3  Other results:   Imaging    No results found.   Medications:     Scheduled Medications: . aspirin EC  81 mg Oral Daily   . bisacodyl  10 mg Oral Daily   Or  . bisacodyl  10 mg Rectal Daily  . Chlorhexidine Gluconate Cloth  6 each Topical Daily  . chlorpheniramine-HYDROcodone  5 mL Oral Q12H  . digoxin  0.125 mg Oral Daily  . docusate sodium  200 mg Oral Daily  . fluconazole  100 mg Oral Daily  . furosemide  80 mg Oral Daily  . gabapentin  100 mg Oral BID  . guaiFENesin  600 mg Oral BID  . lactose free nutrition  237 mL Oral TID WC & HS  . mouth rinse  15 mL Mouth Rinse BID  . pantoprazole  40 mg Oral Daily  . sildenafil  20 mg Oral TID  . sodium chloride flush  10 mL Intravenous Q12H  . spironolactone  25 mg Oral Daily  . Warfarin - Pharmacist Dosing Inpatient   Does not apply q1800    Infusions: . sodium chloride Stopped (03/09/19 1500)  . vancomycin 1,250 mg (03/12/19 0215)    PRN Medications: sodium chloride, hydrALAZINE, lidocaine (PF), ondansetron (ZOFRAN) IV, oxyCODONE, sodium chloride flush, traMADol     Assessment/Plan   1. Acute/Chronic Systolic HF -> cardiogenic shock - ECHO at [facility] EF, 20%.  - ECHO repeated and showed severely reduced EF <20%. RV only mildly down - R/L cath 1/7 normal cors. Output ok   - s/p HM-3 on 02/26/2019 - Ramp echo completed - no change in speed.   - CO-OX 63% off milrinone  - Continue digoxin 0.125 mg daily, dig level 0.2 on 03/06/19 - CVP 6-7. Volume status/ Wt stable. Continue lasix 80 mg daily.  - Continue spiro to 25 mg daily.   - Renal function stable.   - VAD interrogated personally. Parameters stable.   2. VAD -  s/p HM-3 on 02/26/2019 -  RV down some.  - Stable off milrinone. CO-OX 63%  -  VAD interrogated personally. Parameters stable. - Continue sildenafil 20 mg three times a day.  - LDH stable 304 - Continue ASA 81 - INR 1.9. INR Goal 2-2.5 - Continue coumadin. Discussed dosing w/ pharmD - Ramp ECHO completed 03/07/18. Continue Speed 5500.   3. Acute post-op respiratory failure with RUL PNA - Extubated 1/23.  - on fluconazole+  vancomycin - WBC down trending,  23>>21. AF - end fluconazole today and vanc on 2/6  4. Acute blood loss anemia - keep HGB > 8.0 - s/p transfusion x 1 unit 1/27 for hgb of 7.6.  - Hgb has been slowly trending down 9.7>9.1>8.9>8.7 >8.6>>8.2 - No obvious source. Monitor w/ coumadin restart.  5. Elevated bilirubin - Resolved.   6. Moderate size right pneumothorax: new on CXR 1/25.  - s/p placement for Rt sided CT. F/u CXR showed  complete resolution of apical pneumothorax.   7. Hypokalemia/ Hypomangesemia: - Keep K >4.0 and Mg > 2.0   -K 3.5 - Supp K . Give 40 meq potassium.    8. Deconditioning: - continue to ambulate w/ PT.  - May need HHPT   9. Left Pleural Effusion  - S/P Thoracentesis 2/3 with 1.1 liters removed.  - CXR 2/5 w/ persistent small left pleural effusion.  - Denies dyspnea. Monitor    Length of Stay: [address] PA-C  03/12/2019, 8:19 AM  Advanced Heart Failure Team Pager [phone] (M-F; 7a - 4p)  Please contact [facility] for night-coverage after hours (4p -7a ) and weekends on [email or web address]  Patient seen and examined with the above-signed Advanced Practice Provider and/or Housestaff. I personally reviewed laboratory data, imaging studies and relevant notes. I independently examined the patient and formulated the important aspects of the plan. I have edited the note to reflect any of my changes or salient points. I have personally discussed the plan with the patient and/or family.  He is improved today. Co-ox looks good. CVP 6-7. Continue lasix 80 daily.  Now on fluconazole. WBC finally decreasing. Renal function stable. K low will supp. INR 1.9. Discussed dosing with PharmD personally. VAD interrogated personally. Parameters stable.  Continue to mobilize over the weekend.   Glori Bickers, MD  11:36 AM

## 2019-03-12 NOTE — Discharge Summary (Signed)
Occupational Therapy Discharge Patient Details Name: Tage Feggins MRN: [id number] DOB: 1985-09-07 Today's Date: 03/12/2019 Time: 7340-3709 OT Time Calculation (min): 17 min  Patient discharged from OT services secondary to goals met.  Please see latest therapy progress note for current level of functioning and progress toward goals.    Progress and discharge plan discussed with patient.       [name] 03/12/2019, 1:39 PM  [name], OTR/L Acute Rehabilitation Services Pager: [phone] Office: [phone]

## 2019-03-12 NOTE — Plan of Care (Signed)
°  Problem: Education: °Goal: Knowledge of General Education information will improve °Description: Including pain rating scale, medication(s)/side effects and non-pharmacologic comfort measures °Outcome: Progressing °  °Problem: Health Behavior/Discharge Planning: °Goal: Ability to manage health-related needs will improve °Outcome: Progressing °  °Problem: Clinical Measurements: °Goal: Ability to maintain clinical measurements within normal limits will improve °Outcome: Progressing °Goal: Will remain free from infection °Outcome: Progressing °Goal: Diagnostic test results will improve °Outcome: Progressing °Goal: Respiratory complications will improve °Outcome: Progressing °Goal: Cardiovascular complication will be avoided °Outcome: Progressing °  °Problem: Activity: °Goal: Risk for activity intolerance will decrease °Outcome: Progressing °  °Problem: Nutrition: °Goal: Adequate nutrition will be maintained °Outcome: Progressing °  °Problem: Coping: °Goal: Level of anxiety will decrease °Outcome: Progressing °  °Problem: Elimination: °Goal: Will not experience complications related to bowel motility °Outcome: Progressing °Goal: Will not experience complications related to urinary retention °Outcome: Progressing °  °Problem: Pain Managment: °Goal: General experience of comfort will improve °Outcome: Progressing °  °Problem: Safety: °Goal: Ability to remain free from injury will improve °Outcome: Progressing °  °Problem: Skin Integrity: °Goal: Risk for impaired skin integrity will decrease °Outcome: Progressing °  °Problem: Education: °Goal: Ability to demonstrate management of disease process will improve °Outcome: Progressing °Goal: Ability to verbalize understanding of medication therapies will improve °Outcome: Progressing °Goal: Individualized Educational Video(s) °Outcome: Progressing °  °Problem: Activity: °Goal: Capacity to carry out activities will improve °Outcome: Progressing °  °Problem: Cardiac: °Goal:  Ability to achieve and maintain adequate cardiopulmonary perfusion will improve °Outcome: Progressing °  °Problem: Education: °Goal: Knowledge of the prescribed therapeutic regimen will improve °Outcome: Progressing °  °Problem: Activity: °Goal: Risk for activity intolerance will decrease °Outcome: Progressing °  °Problem: Cardiac: °Goal: Ability to maintain an adequate cardiac output will improve °Outcome: Progressing °  °Problem: Coping: °Goal: Level of anxiety will decrease °Outcome: Progressing °  °Problem: Fluid Volume: °Goal: Risk for excess fluid volume will decrease °Outcome: Progressing °  °Problem: Clinical Measurements: °Goal: Ability to maintain clinical measurements within normal limits will improve °Outcome: Progressing °Goal: Will remain free from infection °Outcome: Progressing °  °Problem: Respiratory: °Goal: Will regain and/or maintain adequate ventilation °Outcome: Progressing °  °

## 2019-03-12 NOTE — Progress Notes (Signed)
Occupational Therapy Treatment Patient Details Name: Jon Martin MRN: [id number] DOB: 01-09-1986 Today's Date: 03/12/2019    History of present illness This 34 y.o. male admitted with acute/chronic systolic HF  with cardiogenic shock.  He underwent placement of HM 3 LVAD on 02/26/2019. Post op he developed RUL PNA, as well as acute blood loss anemia. CXR 1/25 w/ new moderate size right pneumothorax s/p CT placement. PMH includes: h/o poly substance abuse, dilated cardiomyopathy   OT comments  Pt is performing ADL and management of LVAD equipment with set up. All goals met. No further OT needs.  Follow Up Recommendations  No OT follow up;Supervision/Assistance - 24 hour    Equipment Recommendations  None recommended by OT    Recommendations for Other Services      Precautions / Restrictions Precautions Precautions: Sternal Precaution Comments: LVAD       Mobility Bed Mobility Overal bed mobility: Modified Independent             General bed mobility comments: HOB up  Transfers Overall transfer level: Independent Equipment used: None             General transfer comment: RN placed portable telemetry and took him off IV.    Balance Overall balance assessment: Independent                                         ADL either performed or assessed with clinical judgement   ADL Overall ADL's : Modified independent     Grooming: Standing;Independent   Upper Body Bathing: Set up;Sitting   Lower Body Bathing: Set up;Sitting/lateral leans   Upper Body Dressing : Set up;Sitting   Lower Body Dressing: Set up;Sit to/from stand   Toilet Transfer: Independent   Toileting- Water quality scientist and Hygiene: Independent       Functional mobility during ADLs: Independent General ADL Comments: identified items in black bag, changed power sources independently     Vision       Perception     Praxis      Cognition Arousal/Alertness:  Awake/alert Behavior During Therapy: WFL for tasks assessed/performed Overall Cognitive Status: Within Functional Limits for tasks assessed                                          Exercises     Shoulder Instructions       General Comments      Pertinent Vitals/ Pain       Pain Assessment: No/denies pain  Home Living                                          Prior Functioning/Environment              Frequency           Progress Toward Goals  OT Goals(current goals can now be found in the care plan section)  Progress towards OT goals: Goals met/education completed, patient discharged from OT  Acute Rehab OT Goals Patient Stated Goal: to get better  OT Goal Formulation: With patient  Plan Discharge plan remains appropriate;All goals met and education completed, patient discharged from OT services    Co-evaluation  AM-PAC OT "6 Clicks" Daily Activity     Outcome Measure   Help from another person eating meals?: None Help from another person taking care of personal grooming?: None Help from another person toileting, which includes using toliet, bedpan, or urinal?: None Help from another person bathing (including washing, rinsing, drying)?: None Help from another person to put on and taking off regular upper body clothing?: None Help from another person to put on and taking off regular lower body clothing?: None 6 Click Score: 24    End of Session        Activity Tolerance Patient tolerated treatment well   Patient Left Other (comment)(walking with PT)   Nurse Communication          Time: 7897-8478 OT Time Calculation (min): 17 min  Charges: OT General Charges $OT Visit: 1 Visit OT Treatments $Self Care/Home Management : 8-22 mins  [name], OTR/L Acute Rehabilitation Services Pager: [phone] Office: [phone]   [name] 03/12/2019, 1:37 PM

## 2019-03-12 NOTE — Progress Notes (Signed)
HeartMate 3 Rounding Note Heartmate 3 Implanted 02-26-2019 Subjective:    34 yo with nonischemic  cardiomyopathy, mod RV dysfunction and inotrope dependent Intermacs 2 cardiogenic shock preop POD # 14   Hx ETOH, drugs, cirrhosis HM3 implant with inotrope supported RV dysfunction Operative bronchoscopy needed for secretion induced RUL collapse- finishing  Fortaz/Cefixime/ Vancomycin for residual infiltrate and postop rising WBC  Postop left thoracentesis for a 1 L pleural effusion-fluid shows scant white cells cultures negative so far.  Chest x-ray follow-up shows no reaccumulation  Coagulopathy related to liver disease, HFwith expected perio transfusion requirement.  Hemoglobin now stable.  Ambulating well with PT Receiving antibiotics for probable aspiration-vancomycin will stop in 24 hours MAP better with diuresis, pain control- prn apresoline Sinus tach 108 off amiodarone   Receiving as needed Lasix to maintain euvolemia  VAD parameters are satisfactory with very few PI events, flow now 4.2 liters .  With milrinone weaned off cooximetry remained 60%. Patient had ramp echo study and pump speed kept at 5500 RPM for optimal flows-4.2 L/min.  Very little aortic valve opening with his severe LV dysfunction.   INR is now 2.1 Epicardial pacing wires have been removed Family has received instruction on driveline dressing change and equipment education.  Patient progressing and will be ready for discharge early next week  LVAD INTERROGATION:  HeartMate III LVAD:  Flow 4.0 liters/min, speed 5500, power 4.8, PI 3.4.     Objective:    Vital Signs:   Temp:  [98.3 F (36.8 C)-99.8 F (37.7 C)] 99.1 F (37.3 C) (02/05 0804) Pulse Rate:  [58-122] 58 (02/05 0804) Resp:  [16-23] 23 (02/05 0804) BP: (70-105)/(53-92) 105/92 (02/05 0804) SpO2:  [94 %-100 %] 97 % (02/05 0804) Weight:  [49 kg] 49 kg (02/05 0342) Last BM Date: 03/08/19 Mean arterial Pressure  80-90  Intake/Output:   Intake/Output Summary (Last 24 hours) at 03/12/2019 0848 Last data filed at 03/12/2019 0700 Gross per 24 hour  Intake --  Output 2200 ml  Net -2200 ml     Physical Exam: HEENT: normal Wounds: sternal incision and DL exit clean and dry General:  Extubated, neuro intact, c/o surgical pain  ENT : JVP normal Carotids; no bruits. No lymphadenopathy or thryomegaly appreciated. Cor: Mechanical heart sounds with LVAD hum present. Lungs: Decreased breath sounds left base Abdomen: soft, nontender, nondistended. No hepatosplenomegaly. No bruits or masses. Good bowel sounds. Extremities: no cyanosis, clubbing, rash, edema Neuro: alert & orientedx3, cranial nerves grossly intact. moves all 4 extremities w/o difficulty. Affect pleasant  Telemetry: nsr rate 100  Labs: Basic Metabolic Panel: Recent Labs  Lab 03/08/19 0357 03/09/19 0210 03/09/19 1655 03/09/19 1655 03/10/19 0400 03/10/19 0400 03/10/19 1800 03/11/19 0500 03/12/19 0500  NA 129*   < > 132*  --  133*  --  132* 131* 132*  K 3.4*   < > 3.9  --  3.8  --  3.9 3.5 3.5  CL 96*   < > 97*  --  99  --  99 100 100  CO2 23   < > 25  --  24  --  24 23 25   GLUCOSE 111*   < > 126*  --  103*  --  103* 134* 119*  BUN 7   < > 6  --  7  --  6 8 9   CREATININE 0.60*   < > 0.64  --  0.58*  --  0.60* 0.62 0.62  CALCIUM 8.7*   < > 8.9   < >  8.6*   < > 9.0 8.6* 8.6*  MG 1.8  --   --   --   --   --   --  1.6*  --    < > = values in this interval not displayed.    Liver Function Tests: Recent Labs  Lab 03/06/19 0500  AST 30  ALT 22  ALKPHOS 64  BILITOT 1.0  PROT 6.5  ALBUMIN 2.3*   No results for input(s): LIPASE, AMYLASE in the last 168 hours. No results for input(s): AMMONIA in the last 168 hours.  CBC: Recent Labs  Lab 03/08/19 0357 03/09/19 0210 03/10/19 0400 03/11/19 0500 03/12/19 0500  WBC 20.7* 22.4* 21.6* 23.2* 21.1*  HGB 8.9* 8.7* 8.7* 8.6* 8.2*  HCT 27.6* 26.7* 26.4* 27.2* 25.4*  MCV 93.2 93.0  91.7 95.1 93.4  PLT 594* 627* 659* 667* 642*    INR: Recent Labs  Lab 03/08/19 0357 03/09/19 0210 03/10/19 0400 03/11/19 0500 03/12/19 0745  INR 2.0* 2.6* 1.8* 2.1* 1.9*    Other results:  EKG:   Imaging: DG Chest 1 View  Result Date: 03/10/2019 CLINICAL DATA:  Status post thoracentesis. EXAM: CHEST  1 VIEW COMPARISON:  March 09, 2019. FINDINGS: Stable cardiomegaly. Left ventricular assist device is unchanged. Right-sided PICC line is unchanged. No pneumothorax is noted. Right lung is clear. Left pleural effusion is significantly smaller. Bony thorax is unremarkable. IMPRESSION: No pneumothorax status post thoracentesis. Left pleural effusion is significantly smaller. Electronically Signed   By: Lupita Raider M.D.   On: 03/10/2019 10:38   DG Chest 2 View  Result Date: 03/11/2019 CLINICAL DATA:  34 year old male with a history left ventricular assist device EXAM: CHEST - 2 VIEW COMPARISON:  Multiple prior most recent 03/10/2019 FINDINGS: Cardiomediastinal silhouette unchanged with cardiomegaly. Surgical changes of median sternotomy and LVAD. Right upper extremity PICC with the tip appearing to terminate superior vena cava. Coarsened interstitial markings with no confluent airspace disease. Blunting of the costophrenic sulcus on the lateral view. Thickening of the minor fissure on the lateral view. IMPRESSION: Trace bilateral pleural effusions. Surgical changes of median sternotomy and LVAD, with cardiomegaly. Unchanged right upper extremity PICC. Electronically Signed   By: Gilmer Mor D.O.   On: 03/11/2019 08:33   DG Chest Port 1 View  Result Date: 03/12/2019 CLINICAL DATA:  Cough.  Left ventricular assist device. EXAM: PORTABLE CHEST 1 VIEW COMPARISON:  03/11/2019 FINDINGS: Right-sided PICC line tip at superior caval/atrial junction. Midline trachea. Moderate cardiomegaly. Prior median sternotomy. Left ventricular assist device is unchanged in position. The right-sided pleural  effusion has resolved. Probable small left pleural effusion. No pneumothorax. Development of mild pulmonary venous congestion. Persistent left base airspace disease. IMPRESSION: Cardiomegaly with development of mild pulmonary venous congestion. Persistent small left pleural effusion with adjacent airspace disease, likely atelectasis. Electronically Signed   By: Jeronimo Greaves M.D.   On: 03/12/2019 08:38   IR THORACENTESIS ASP PLEURAL SPACE W/IMG GUIDE  Result Date: 03/10/2019 INDICATION: Patient status post recent LVAD placement, now with left pleural effusion. Request is made for diagnostic and therapeutic left thoracentesis. EXAM: ULTRASOUND GUIDED DIAGNOSTIC AND THERAPEUTIC LEFT THORACENTESIS MEDICATIONS: 10 mL 1% lidocaine COMPLICATIONS: None immediate. PROCEDURE: An ultrasound guided thoracentesis was thoroughly discussed with the patient and questions answered. The benefits, risks, alternatives and complications were also discussed. The patient understands and wishes to proceed with the procedure. Written consent was obtained. Ultrasound was performed to localize and mark an adequate pocket of fluid in the left chest.  The area was then prepped and draped in the normal sterile fashion. 1% Lidocaine was used for local anesthesia. Under ultrasound guidance a 6 Fr Safe-T-Centesis catheter was introduced. Thoracentesis was performed. The catheter was removed and a dressing applied. FINDINGS: A total of approximately 1.1 liters of amber fluid was removed. Samples were sent to the laboratory as requested by the clinical team. IMPRESSION: Successful ultrasound guided diagnostic and therapeutic left thoracentesis yielding 1.1 liters of pleural fluid. Read by: Brynda Greathouse PA-C Electronically Signed   By: Sandi Mariscal M.D.   On: 03/10/2019 10:58     Medications:     Scheduled Medications: . aspirin EC  81 mg Oral Daily  . bisacodyl  10 mg Oral Daily   Or  . bisacodyl  10 mg Rectal Daily  . Chlorhexidine  Gluconate Cloth  6 each Topical Daily  . chlorpheniramine-HYDROcodone  5 mL Oral Q12H  . digoxin  0.125 mg Oral Daily  . docusate sodium  200 mg Oral Daily  . furosemide  80 mg Oral Daily  . gabapentin  100 mg Oral BID  . guaiFENesin  600 mg Oral BID  . lactose free nutrition  237 mL Oral TID WC & HS  . mouth rinse  15 mL Mouth Rinse BID  . pantoprazole  40 mg Oral Daily  . sildenafil  20 mg Oral TID  . sodium chloride flush  10 mL Intravenous Q12H  . spironolactone  25 mg Oral Daily  . Warfarin - Pharmacist Dosing Inpatient   Does not apply q1800    Infusions: . sodium chloride Stopped (03/09/19 1500)  . vancomycin 1,250 mg (03/12/19 0215)    PRN Medications: sodium chloride, hydrALAZINE, lidocaine (PF), ondansetron (ZOFRAN) IV, oxyCODONE, sodium chloride flush, traMADol   Assessment:  Postop coagulopathy- resolved Expected posop blood loss anemia -hemoglobin stable  VAD parameters satisfactorywith flow 4.2 L Airway secretions/ RUL atelectasis- improved but with cough, elevated white count improving and antibiotics to be stopped within 24 hours Sternal incision, DL incision clean, dry Postop bilrubin 4.2 >> 5.4>> 5.0 >> 2.4,  >> 1.2 follow Postop elevated MAP- now improved   Plan/Discussion:     Milrinone weaned off with adequate mixed venous saturation Ambulating  with PT Heart healthy diet, pulmonary toilet with flutter valve and incentive spirometry Postop mod L pleural effusion- L thoracentesis removed  1 L and post thoracentesis x-ray remained satisfactory Pharm D now dosing coumadin  I reviewed the LVAD parameters from today, and compared the results to the patient's prior recorded data.  No programming changes were made.  The LVAD is functioning within specified parameters.  The patient performs LVAD self-test daily.  LVAD interrogation was negative for any significant power changes, alarms or PI events/speed drops.  LVAD equipment check completed and is in good  working order.  Back-up equipment present.   LVAD education done on emergency procedures and precautions and reviewed exit site care.  Length of Stay: [place] [facility] 03/12/2019, 8:48 AM

## 2019-03-12 NOTE — TOC Progression Note (Signed)
Transition of Care [facility]) - Progression Note    Patient Details  Name: Jon Martin MRN: [id number] Date of Birth: [birth year]  Transition of Care [facility]) CM/SW Contact  Leone Haven, RN Phone Number: 03/12/2019, 2:11 PM  Clinical Narrative:    NCM received call from [facility] , lvad coordinator , she states this patient will need  HHPT, when goes home on Monday.  NCM spoke with patient offered choice he states he does not have a preference because he does not know who covers his area.  NCM made referral to [facility].  Faxed information to Toniann Fail at [phone], phone is [phone].  NCM confirmed they received fax.  They are able to take referral .  NCM informed them that patient is for dc on Monday.   Expected Discharge Plan: Home w Home Health Services Barriers to Discharge: Continued Medical Work up  Expected Discharge Plan and Services Expected Discharge Plan: Home w Home Health Services In-house Referral: NA Discharge Planning Services: CM Consult Post Acute Care Choice: Home Health Living arrangements for the past 2 months: Single Family Home                 DME Arranged: (NA)         HH Arranged: PT HH Agency: [facility]) Date HH Agency Contacted: 03/12/19 Time HH Agency Contacted: 1408 Representative spoke with at [facility] Agency: Luretha Murphy   Social Determinants of Health (SDOH) Interventions Alcohol Brief Interventions/Follow-up: Continued Monitoring  Readmission Risk Interventions No flowsheet data found.

## 2019-03-12 NOTE — Progress Notes (Signed)
Physical Therapy Treatment Patient Details Name: Jon Martin MRN: [id number] DOB: 1985-06-17 Today's Date: 03/12/2019    History of Present Illness This 34 y.o. male admitted with acute/chronic systolic HF  with cardiogenic shock.  He underwent placement of HM 3 LVAD on 02/26/2019. Post op he developed RUL PNA, as well as acute blood loss anemia. CXR 1/25 w/ new moderate size right pneumothorax s/p CT placement. PMH includes: h/o poly substance abuse, dilated cardiomyopathy    PT Comments    Patient is making good progress with PT.  From a mobility standpoint anticipate patient will be ready for DC home when medically ready.  Given pt's progress frequency decreased to 1 X week. Current d/c plan remains appropriate.    Follow Up Recommendations  Home health PT;Supervision for mobility/OOB     Equipment Recommendations  None recommended by PT    Recommendations for Other Services       Precautions / Restrictions Precautions Precautions: Sternal Precaution Comments: LVAD    Mobility  Bed Mobility Overal bed mobility: Independent             General bed mobility comments: HOB up  Transfers Overall transfer level: Independent Equipment used: None             General transfer comment: RN placed portable telemetry and took him off IV.  Ambulation/Gait Ambulation/Gait assistance: Independent Gait Distance (Feet): 1000 Feet Assistive device: None           Stairs   Stairs assistance: Modified independent (Device/Increase time) Stair Management: No rails;Alternating pattern;Forwards Number of Stairs: (flight)     Wheelchair Mobility    Modified Rankin (Stroke Patients Only)       Balance Overall balance assessment: No apparent balance deficits (not formally assessed)                                          Cognition Arousal/Alertness: Awake/alert Behavior During Therapy: WFL for tasks assessed/performed Overall Cognitive  Status: Within Functional Limits for tasks assessed                                        Exercises      General Comments        Pertinent Vitals/Pain Pain Assessment: No/denies pain    Home Living                      Prior Function            PT Goals (current goals can now be found in the care plan section) Acute Rehab PT Goals Patient Stated Goal: to get better  Progress towards PT goals: Progressing toward goals    Frequency    Min 1X/week      PT Plan Frequency needs to be updated    Co-evaluation              AM-PAC PT "6 Clicks" Mobility   Outcome Measure  Help needed turning from your back to your side while in a flat bed without using bedrails?: None Help needed moving from lying on your back to sitting on the side of a flat bed without using bedrails?: None Help needed moving to and from a bed to a chair (including a wheelchair)?: None Help needed standing up from  a chair using your arms (e.g., wheelchair or bedside chair)?: None Help needed to walk in hospital room?: None Help needed climbing 3-5 steps with a railing? : A Little 6 Click Score: 23    End of Session         PT Visit Diagnosis: Other abnormalities of gait and mobility (R26.89)     Time: 1040-1056 PT Time Calculation (min) (ACUTE ONLY): 16 min  Charges:  $Gait Training: 8-22 mins                     Earney Navy, PTA Acute Rehabilitation Services Pager: [phone] Office: [phone]     Darliss Cheney 03/12/2019, 2:35 PM

## 2019-03-12 NOTE — Progress Notes (Signed)
LVAD Coordinator Rounding Note:  Admitted 02/10/19 due to CHF from OSH .   HM3 LVAD implanted on 02/26/19 by PVT under DT criteria.  Pt laying in bed napping upon my arrival. Easily arouseable. States he is feeling great this morning. He is looking forward to going home soon. PT/OT in room to work with him.   Vital signs: Temp: 99.1 HR: 113 Doppler Pressure: 80 Automatic cuff: 105/92 (99) O2 Sat: 97% on RA Wt: 106.4>116.4>128>112>115>115.5>110.4>108.6>109.3>108 lbs   LVAD interrogation reveals:  Speed: 5500 Flow: 4.6 Power: 4.1 w PI: 2.4 Alarms: none Events:  0 PI events; 2 PI events overnight Hematocrit: 26  Fixed speed: 5500 Low speed limit: 5200   Drive Line: CDI. Anchor secure x 2. Every other day dressing changes per VAD Coordinator or Nurse Alla Feeling. Next dressing change due 03/13/19.   Labs:  LDH trend: 243>418>431>412>373>352>355>379>351>322>328>311>304  INR trend: 1.3>1.9>1.5>1.2>1.5>1.5>2.0>2.0>2.6>1.8>2.1>1.9  Wbc: 20.7>22.4>21.6>23.2>21.1  Anticoagulation Plan: -INR Goal: 2-2.5 -ASA Dose: 81 mg  Blood Products:  -Intra op: 2 u PRBCS 6 FFP 1 PLT  -Post-op: - 03/03/19>>1 unit PCs  Device: -N/A  Gtts: Milrinone 0.125 mcg/kg/min>>off Heparin 800 units/hr>>off  Infection: Urine cx 03/06/19>> No growth Blood cx 03/06/19>> NGTD  Respiratory: Extubated 02/27/19 03/01/19>>40% BiPAP added   03/01/19>>CT placed for right pnuemothorax 03/02/19>>O2 per [place] 03/03/19>>RA  Arrhythmia:  - 02/28/19>>Amiodarone 30 mg/hr started for atrial tachycardia>>off  Nitric Oxide:  03/02/19>>off   VAD Education: Discharge teaching completed 03/12/19. Had patient document in his daily log folder this morning. Provided with 2 LVAD emergency bracelets; placed in black bag for now while in hospital.   Plan/Recommendations:  1. Page VAD coordinator w/any equipment issues or concerns or driveline management questions. 2. Every other day dressing change per VAD coordinator or  nurse champion. Next dressing change due 03/13/19.   Alyce Pagan RN VAD Coordinator  Office: [phone]  24/7 Pager: [phone]

## 2019-03-12 NOTE — Progress Notes (Signed)
Pt sleeping soundly. Noted he walked earlier with PT. Will let him rest  And walk later with staff. Ethelda Chick CES, ACSM 2:47 PM 03/12/2019

## 2019-03-12 NOTE — Progress Notes (Signed)
ANTICOAGULATION CONSULT NOTE  Pharmacy Consult for Warfarin Indication: LVAD  No Known Allergies  Patient Measurements: Height: 5\' 8"  (172.7 cm) Weight: 108 lb 0.4 oz (49 kg) IBW/kg (Calculated) : 68.4  Vital Signs: Temp: 99 F (37.2 C) (02/05 1137) Temp Source: Oral (02/05 1137) BP: 104/79 (02/05 1137) Pulse Rate: 112 (02/05 1137)  Labs: Recent Labs    03/10/19 0400 03/10/19 0400 03/10/19 1800 03/11/19 0500 03/12/19 0500 03/12/19 0745  HGB 8.7*   < >  --  8.6* 8.2*  --   HCT 26.4*  --   --  27.2* 25.4*  --   PLT 659*  --   --  667* 642*  --   LABPROT 20.8*  --   --  23.3*  --  21.9*  INR 1.8*  --   --  2.1*  --  1.9*  CREATININE 0.58*   < > 0.60* 0.62 0.62  --    < > = values in this interval not displayed.    Estimated Creatinine Clearance: 91 mL/min (by C-G formula based on SCr of 0.62 mg/dL).   Medical History: Past Medical History:  Diagnosis Date  . Dilated cardiomyopathy (HCC)   . Dilated cardiomyopathy (HCC) 02/2019  . Polysubstance abuse [facility])     Assessment: 34 year old male with LVAD placed 1/22 on Warfarin therapy with goal INR 2.0 to 2.5. Patient has been on warfarin since 1/24. INR had been therapeutic on an average of 5mg  daily.   INR today 1.9 < goal - Fell last pm with lower (concern for DI with fluconazole) - has been onn/off warfarin for thoracentesis 1.1 L removed and added fluconazole for 3 days -last 2/5.   No bleeding  after holding warfarin dose on 2/2. Hgb stable in 8s plt 659. LDH 300s stable.  Boost drink daily   Goal of Therapy:  INR goal 2.0 to 2.5 Monitor platelets by anticoagulation protocol: Yes   Plan:   warfarin 4 mg tonight  Monitor INR daily for now.  Monitor for any signs or symptoms of bleeding.   2/24 Pharm.D. CPP, BCPS Clinical Pharmacist [phone] 03/12/2019 1:10 PM    Please check AMION for all [facility] Pharmacy phone numbers After 10:00 PM, call Main Pharmacy [phone] 03/12/2019,1:10 PM

## 2019-03-12 NOTE — TOC Progression Note (Signed)
Transition of Care [facility]) - Progression Note    Patient Details  Name: Zurich Carreno MRN: [id number] Date of Birth: [birth year]  Transition of Care [facility]) CM/SW Contact  Leone Haven, RN Phone Number: 03/12/2019, 11:03 AM  Clinical Narrative:    NCM contacted [facility] at [phone], fax [phone].  NCM spoke with Luretha Murphy, she state they have had this patient before for [facility] services, I will need to fax orders over to her.  NCM left message with [name] LVAD coordinator , need orders.     Expected Discharge Plan: Home w Home Health Services Barriers to Discharge: (Plan for LVAD Friday)  Expected Discharge Plan and Services Expected Discharge Plan: Home w Home Health Services In-house Referral: NA Discharge Planning Services: CM Consult Post Acute Care Choice: Home Health, Durable Medical Equipment Living arrangements for the past 2 months: Single Family Home                                       Social Determinants of Health (SDOH) Interventions Alcohol Brief Interventions/Follow-up: Continued Monitoring  Readmission Risk Interventions No flowsheet data found.

## 2019-03-12 NOTE — TOC Benefit Eligibility Note (Addendum)
Transition of Care [facility]) Benefit Eligibility Note    Patient Details  Name: Jon Martin MRN: [id number] Date of Birth: 1985-11-02                             MEDICAID OF [place] AND  NO COVERAGE FOR [place] FOR PRESCRIPTION   Mardene Sayer Phone Number: 03/12/2019, 4:35 PM

## 2019-03-13 LAB — BASIC METABOLIC PANEL
Anion gap: 9 (ref 5–15)
BUN: 8 mg/dL (ref 6–20)
CO2: 23 mmol/L (ref 22–32)
Calcium: 8.9 mg/dL (ref 8.9–10.3)
Chloride: 99 mmol/L (ref 98–111)
Creatinine, Ser: 0.55 mg/dL — ABNORMAL LOW (ref 0.61–1.24)
GFR calc Af Amer: >60 mL/min (ref >60)
GFR calc non Af Amer: >60 mL/min (ref >60)
Glucose, Bld: 123 mg/dL — ABNORMAL HIGH (ref 70–99)
Potassium: 3.5 mmol/L (ref 3.5–5.1)
Sodium: 131 mmol/L — ABNORMAL LOW (ref 135–145)

## 2019-03-13 LAB — COOXEMETRY PANEL
Carboxyhemoglobin: 1.6 % — ABNORMAL HIGH (ref 0.5–1.5)
Methemoglobin: 1.1 % (ref 0.0–1.5)
O2 Saturation: 75.1 %
Total hemoglobin: 8.7 g/dL — ABNORMAL LOW (ref 12.0–16.0)

## 2019-03-13 LAB — CBC
HCT: 26.1 % — ABNORMAL LOW (ref 39.0–52.0)
Hemoglobin: 8.3 g/dL — ABNORMAL LOW (ref 13.0–17.0)
MCH: 29.4 pg (ref 26.0–34.0)
MCHC: 31.8 g/dL (ref 30.0–36.0)
MCV: 92.6 fL (ref 80.0–100.0)
Platelets: 654 10*3/uL — ABNORMAL HIGH (ref 150–400)
RBC: 2.82 MIL/uL — ABNORMAL LOW (ref 4.22–5.81)
RDW: 14.1 % (ref 11.5–15.5)
WBC: 19.1 10*3/uL — ABNORMAL HIGH (ref 4.0–10.5)
nRBC: 0 % (ref 0.0–0.2)

## 2019-03-13 LAB — MAGNESIUM: Magnesium: 1.7 mg/dL (ref 1.7–2.4)

## 2019-03-13 LAB — LACTATE DEHYDROGENASE: LDH: 292 U/L — ABNORMAL HIGH (ref 98–192)

## 2019-03-13 LAB — PROTIME-INR
INR: 2.2 — ABNORMAL HIGH (ref 0.8–1.2)
Prothrombin Time: 24.4 seconds — ABNORMAL HIGH (ref 11.4–15.2)

## 2019-03-13 MED ORDER — POTASSIUM CHLORIDE CRYS ER 20 MEQ PO TBCR
40.0000 meq | EXTENDED_RELEASE_TABLET | ORAL | Status: AC
  Administered 2019-03-13 (×2): 40 meq via ORAL
  Filled 2019-03-13 (×2): qty 2

## 2019-03-13 MED ORDER — WARFARIN SODIUM 2.5 MG PO TABS
2.5000 mg | ORAL_TABLET | Freq: Once | ORAL | Status: AC
  Administered 2019-03-13: 17:00:00 2.5 mg via ORAL
  Filled 2019-03-13: qty 1

## 2019-03-13 MED ORDER — MAGNESIUM SULFATE 2 GM/50ML IV SOLN
2.0000 g | Freq: Once | INTRAVENOUS | Status: AC
  Administered 2019-03-13: 2 g via INTRAVENOUS
  Filled 2019-03-13: qty 50

## 2019-03-13 NOTE — Progress Notes (Signed)
CARDIAC REHAB PHASE I   PRE:  Rate/Rhythm: 111 st  BP:  Supine:   Sitting: 85/68 (75)  dopplered at 78  Standing:    SaO2: 96%RA  MODE:  Ambulation: 850+ ft   POST:  Rate/Rhythm: 126 ST  BP:  Supine:   Sitting: dopplered 76  Standing:    SaO2: 96%RA 1240-1320 Pt switched to batteries and back to power source with ease. Did alarm check and described what was in black bag. Pt walked over 850 ft on RA with steady gait and I managed IV pole. Tolerated well. Back to power source in bed.   Luetta Nutting, RN BSN  03/13/2019 1:16 PM

## 2019-03-13 NOTE — Progress Notes (Signed)
Patient ID: Jon Martin, male   DOB: 20-Apr-1985, 34 y.o.   MRN: [id number]     Advanced Heart Failure Rounding Note  PCP-Cardiologist: No primary care provider on file.   Subjective:    Underwent HM-3 placement 02/26/2019.   Was up all night watching TV. Now sleeping but will awaken. No CP or SOb. Cough improving. Low grade temp overnight but now resolved. CVP 11    LVAD Interrogation HM 3: Speed: 5500 Flow 4.4  PI: 3.0 Power:4.0 A few PI events today  VAD interrogated personally.    Objective:   Weight Range: 48.8 kg Body mass index is 16.36 kg/m.   Vital Signs:  Maps 70-80s   Temp:  [98 F (36.7 C)-100.1 F (37.8 C)] 98.7 F (37.1 C) (02/06 0930) Pulse Rate:  [59-119] 101 (02/06 0930) Resp:  [16-31] 31 (02/06 0930) BP: (74-115)/(53-83) 94/82 (02/06 0930) SpO2:  [97 %-100 %] 99 % (02/06 0930) Weight:  [48.8 kg] 48.8 kg (02/06 0341) Last BM Date: 03/12/19  Weight change: Filed Weights   03/11/19 0321 03/12/19 0342 03/13/19 0341  Weight: 49.6 kg 49 kg 48.8 kg    Intake/Output:   Intake/Output Summary (Last 24 hours) at 03/13/2019 0945 Last data filed at 03/13/2019 0500 Gross per 24 hour  Intake 1135.86 ml  Output 1650 ml  Net -514.14 ml      Physical Exam   Physical Exam: CVP 10-11 General:  Thin male NAD.  HEENT: normal  Neck: supple. JVP 10-11 + prominent CV waves Carotids 2+ bilat; no bruits. No lymphadenopathy or thryomegaly appreciated. Cor: LVAD hum.  Lungs: Clear. Dull at basese Abdomen:  soft, nontender, non-distended. No hepatosplenomegaly. No bruits or masses. Good bowel sounds. Driveline site clean. Anchor in place.  Extremities: no cyanosis, clubbing, rash. Warm no edema  Neuro: alert & oriented x 3. No focal deficits. Moves all 4 without problem     Telemetry   Sinus Tach 100-110 personally checked   Labs    CBC Recent Labs    03/12/19 0500 03/13/19 0439  WBC 21.1* 19.1*  HGB 8.2* 8.3*  HCT 25.4* 26.1*  MCV 93.4 92.6  PLT  642* 409*   Basic Metabolic Panel Recent Labs    03/11/19 0500 03/11/19 0500 03/12/19 0500 03/13/19 0439  NA 131*   < > 132* 131*  K 3.5   < > 3.5 3.5  CL 100   < > 100 99  CO2 23   < > 25 23  GLUCOSE 134*   < > 119* 123*  BUN 8   < > 9 8  CREATININE 0.62   < > 0.62 0.55*  CALCIUM 8.6*   < > 8.6* 8.9  MG 1.6*  --   --  1.7   < > = values in this interval not displayed.   Liver Function Tests No results for input(s): AST, ALT, ALKPHOS, BILITOT, PROT, ALBUMIN in the last 72 hours. No results for input(s): LIPASE, AMYLASE in the last 72 hours. Cardiac Enzymes No results for input(s): CKTOTAL, CKMB, CKMBINDEX, TROPONINI in the last 72 hours.  BNP: BNP (last 3 results) Recent Labs    02/27/19 0433 03/05/19 0056 03/12/19 0542  BNP 447.0* 427.2* 135.5*    ProBNP (last 3 results) No results for input(s): PROBNP in the last 8760 hours.   D-Dimer No results for input(s): DDIMER in the last 72 hours. Hemoglobin A1C No results for input(s): HGBA1C in the last 72 hours. Fasting Lipid Panel No results for input(s):  CHOL, HDL, LDLCALC, TRIG, CHOLHDL, LDLDIRECT in the last 72 hours. Thyroid Function Tests No results for input(s): TSH, T4TOTAL, T3FREE, THYROIDAB in the last 72 hours.  Invalid input(s): FREET3  Other results:   Imaging    No results found.   Medications:     Scheduled Medications: . aspirin EC  81 mg Oral Daily  . bisacodyl  10 mg Oral Daily   Or  . bisacodyl  10 mg Rectal Daily  . Chlorhexidine Gluconate Cloth  6 each Topical Daily  . chlorpheniramine-HYDROcodone  5 mL Oral Q12H  . digoxin  0.125 mg Oral Daily  . docusate sodium  200 mg Oral Daily  . furosemide  80 mg Oral Daily  . gabapentin  100 mg Oral BID  . guaiFENesin  600 mg Oral BID  . lactose free nutrition  237 mL Oral TID WC & HS  . mouth rinse  15 mL Mouth Rinse BID  . pantoprazole  40 mg Oral Daily  . sildenafil  20 mg Oral TID  . sodium chloride flush  10 mL Intravenous Q12H   . spironolactone  25 mg Oral Daily  . Warfarin - Pharmacist Dosing Inpatient   Does not apply q1800    Infusions: . sodium chloride Stopped (03/09/19 1500)  . vancomycin 1,250 mg (03/13/19 0208)    PRN Medications: sodium chloride, hydrALAZINE, lidocaine (PF), ondansetron (ZOFRAN) IV, oxyCODONE, sodium chloride flush, traMADol     Assessment/Plan   1. Acute/Chronic Systolic HF -> cardiogenic shock - ECHO at [facility] EF, 20%.  - ECHO repeated and showed severely reduced EF <20%. RV only mildly down - R/L cath 1/7 normal cors. Output ok   - s/p HM-3 on 02/26/2019 - Ramp echo completed - no change in speed.   - CO-OX 75% off milrinone  - Continue digoxin 0.125 mg daily, dig level 0.2 on 03/06/19 - CVP 10. Volume status/ Wt stable. Continue lasix 80 mg daily. Can give extra as needed - Continue spiro to 25 mg daily.   - Renal function stable.   2. VAD -  s/p HM-3 on 02/26/2019. VAD interrogated personally. Parameters stable.  -  RV down some.  - Continue sildenafil 20 mg three times a day.  - LDH stable 304 - Continue ASA 81 - INR 2.2. INR Goal 2-2.5 - DL site ok - Discussed dosing with PharmD personally. - Ramp ECHO completed 03/07/18. Continue Speed 5500.  - Mobilize   3. Acute post-op respiratory failure with RUL PNA - Extubated 1/23.  - on fluconazole+ vancomycin - WBC down trending,  23>>21> 19.1.  - low grade fever overnight now resolved. ? atx - Fluconazole x 3 days finished yesterday. vanc ends today - follow closely.  - continue IS  4. Acute blood loss anemia - keep HGB > 8.0 - s/p transfusion x 1 unit 1/27 for hgb of 7.6.  - Hgb stable today at 8.3  5. Elevated bilirubin - Resolved.   6. Moderate size right pneumothorax: new on CXR 1/25.  - s/p placement for Rt sided CT. F/u CXR showed complete resolution of apical pneumothorax.   7. Hypokalemia/ Hypomangesemia: - Keep K >4.0 and Mg > 2.0   -K 3.5. supp today  8. Deconditioning: - continue to  ambulate w/ PT.  - May need HHPT   9. Left Pleural Effusion  - S/P Thoracentesis 2/3 with 1.1 liters removed.  - CXR 2/5 w/ persistent small left pleural effusion.  - Denies dyspnea. Monitor    Length  of Stay: 75  Arvilla Meres, MD  03/13/2019, 9:45 AM  Advanced Heart Failure Team Pager [phone] (M-F; 7a - 4p)  Please contact [facility] for night-coverage after hours (4p -7a ) and weekends on [email or web address]

## 2019-03-13 NOTE — Progress Notes (Signed)
ANTICOAGULATION CONSULT NOTE  Pharmacy Consult for Warfarin Indication: LVAD  No Known Allergies  Patient Measurements: Height: 5\' 8"  (172.7 cm) Weight: 107 lb 9.4 oz (48.8 kg) IBW/kg (Calculated) : 68.4  Vital Signs: Temp: 98.7 F (37.1 C) (02/06 0930) Temp Source: Oral (02/06 0930) BP: 93/62 (02/06 1030) Pulse Rate: 59 (02/06 1030)  Labs: Recent Labs    03/11/19 0500 03/11/19 0500 03/12/19 0500 03/12/19 0745 03/13/19 0439  HGB 8.6*   < > 8.2*  --  8.3*  HCT 27.2*  --  25.4*  --  26.1*  PLT 667*  --  642*  --  654*  LABPROT 23.3*  --   --  21.9* 24.4*  INR 2.1*  --   --  1.9* 2.2*  CREATININE 0.62  --  0.62  --  0.55*   < > = values in this interval not displayed.    Estimated Creatinine Clearance: 90.7 mL/min (A) (by C-G formula based on SCr of 0.55 mg/dL (L)).   Medical History: Past Medical History:  Diagnosis Date  . Dilated cardiomyopathy (HCC)   . Dilated cardiomyopathy (HCC) 02/2019  . Polysubstance abuse [facility])     Assessment: 34 year old male with LVAD placed 1/22 on Warfarin therapy with goal INR 2.0 to 2.5. Patient has been on warfarin since 1/24. INR had been therapeutic on an average of 5mg  daily.   INR today 2.2 at goal - has been on/off warfarin for thoracentesis 1.1 L removed and added fluconazole for 3 days (possible DDI with warfarin) - completed on 2/5.   No bleeding after holding warfarin dose on 2/2. Hgb stable in 8s plt 654. LDH 300s stable.  Boost drink daily   Goal of Therapy:  INR goal 2.0 to 2.5 Monitor platelets by anticoagulation protocol: Yes   Plan:  Warfarin 2.5 mg tonight  Monitor INR daily for now Monitor for any signs or symptoms of bleeding.   2/24, PharmD, BCPS PGY2 Cardiology Pharmacy Resident Phone [phone] 03/13/2019       11:13 AM  Please check [email or web address] for unit-specific pharmacist phone numbers

## 2019-03-13 NOTE — Plan of Care (Signed)

## 2019-03-13 NOTE — Plan of Care (Signed)

## 2019-03-14 LAB — BASIC METABOLIC PANEL
Anion gap: 9 (ref 5–15)
BUN: 7 mg/dL (ref 6–20)
CO2: 25 mmol/L (ref 22–32)
Calcium: 9 mg/dL (ref 8.9–10.3)
Chloride: 98 mmol/L (ref 98–111)
Creatinine, Ser: 0.55 mg/dL — ABNORMAL LOW (ref 0.61–1.24)
GFR calc Af Amer: >60 mL/min (ref >60)
GFR calc non Af Amer: >60 mL/min (ref >60)
Glucose, Bld: 100 mg/dL — ABNORMAL HIGH (ref 70–99)
Potassium: 4.1 mmol/L (ref 3.5–5.1)
Sodium: 132 mmol/L — ABNORMAL LOW (ref 135–145)

## 2019-03-14 LAB — CBC
HCT: 26.3 % — ABNORMAL LOW (ref 39.0–52.0)
Hemoglobin: 8.4 g/dL — ABNORMAL LOW (ref 13.0–17.0)
MCH: 29.9 pg (ref 26.0–34.0)
MCHC: 31.9 g/dL (ref 30.0–36.0)
MCV: 93.6 fL (ref 80.0–100.0)
Platelets: 674 10*3/uL — ABNORMAL HIGH (ref 150–400)
RBC: 2.81 MIL/uL — ABNORMAL LOW (ref 4.22–5.81)
RDW: 14.2 % (ref 11.5–15.5)
WBC: 14.1 10*3/uL — ABNORMAL HIGH (ref 4.0–10.5)
nRBC: 0 % (ref 0.0–0.2)

## 2019-03-14 LAB — PROTIME-INR
INR: 2.1 — ABNORMAL HIGH (ref 0.8–1.2)
Prothrombin Time: 23.3 seconds — ABNORMAL HIGH (ref 11.4–15.2)

## 2019-03-14 LAB — COOXEMETRY PANEL
Carboxyhemoglobin: 1.5 % (ref 0.5–1.5)
Methemoglobin: 1 % (ref 0.0–1.5)
O2 Saturation: 72.5 %
Total hemoglobin: 9.2 g/dL — ABNORMAL LOW (ref 12.0–16.0)

## 2019-03-14 LAB — LACTATE DEHYDROGENASE: LDH: 289 U/L — ABNORMAL HIGH (ref 98–192)

## 2019-03-14 MED ORDER — WARFARIN SODIUM 5 MG PO TABS
5.0000 mg | ORAL_TABLET | Freq: Once | ORAL | Status: AC
  Administered 2019-03-14: 5 mg via ORAL
  Filled 2019-03-14: qty 1

## 2019-03-14 MED ORDER — CARVEDILOL 3.125 MG PO TABS
3.1250 mg | ORAL_TABLET | Freq: Two times a day (BID) | ORAL | Status: DC
  Administered 2019-03-14 – 2019-03-15 (×2): 3.125 mg via ORAL
  Filled 2019-03-14 (×2): qty 1

## 2019-03-14 NOTE — Progress Notes (Signed)
Pt ambulated ~812ft x3 in the hall without any assistance. Denied sob/pain. Back in the bed with call bell within reach.

## 2019-03-14 NOTE — Progress Notes (Signed)
ANTICOAGULATION CONSULT NOTE  Pharmacy Consult for Warfarin Indication: LVAD  No Known Allergies  Patient Measurements: Height: 5\' 8"  (172.7 cm) Weight: 106 lb 3.2 oz (48.2 kg) IBW/kg (Calculated) : 68.4  Vital Signs: Temp: 99.1 F (37.3 C) (02/07 0950) Temp Source: Oral (02/07 0950) BP: 84/62 (02/07 0950) Pulse Rate: 113 (02/07 0958)  Labs: Recent Labs    03/12/19 0500 03/12/19 0500 03/12/19 0745 03/13/19 0439 03/14/19 0409  HGB 8.2*   < >  --  8.3* 8.4*  HCT 25.4*  --   --  26.1* 26.3*  PLT 642*  --   --  654* 674*  LABPROT  --   --  21.9* 24.4* 23.3*  INR  --   --  1.9* 2.2* 2.1*  CREATININE 0.62  --   --  0.55* 0.55*   < > = values in this interval not displayed.    Estimated Creatinine Clearance: 89.5 mL/min (A) (by C-G formula based on SCr of 0.55 mg/dL (L)).   Medical History: Past Medical History:  Diagnosis Date  . Dilated cardiomyopathy (HCC)   . Dilated cardiomyopathy (HCC) 02/2019  . Polysubstance abuse [facility])     Assessment: 34 year old Martin with LVAD placed 1/22 on Warfarin therapy with goal INR 2.0 to 2.5. Patient has been on warfarin since 1/24. INR had been therapeutic on an average of 5mg  daily.   INR today 2.1 at goal - has been on/off warfarin for thoracentesis 1.1 L removed and added fluconazole for 3 days (possible DDI with warfarin) - completed on 2/5.   No bleeding after holding warfarin dose on 2/2. Hgb stable in 8s plt 674. LDH 280-300 stable.  Boost drink daily   Goal of Therapy:  INR goal 2.0 to 2.5 Monitor platelets by anticoagulation protocol: Yes   Plan:  Warfarin 5 mg tonight  Monitor INR daily for now Monitor for any signs or symptoms of bleeding.   2/24, PharmD, BCPS PGY2 Cardiology Pharmacy Resident Phone [phone] 03/14/2019       1:18 PM  Please check [email or web address] for unit-specific pharmacist phone numbers

## 2019-03-14 NOTE — Progress Notes (Signed)
Patient ID: Jon Martin, male   DOB: September 05, 1985, 34 y.o.   MRN: [id number]     Advanced Heart Failure Rounding Note  PCP-Cardiologist: No primary care provider on file.   Subjective:    Underwent HM-3 placement 02/26/2019.   Feels good. Able to walk halls without problem. Cough improved. Denies dyspnea. Still with low-grade fever. WBC coming down. Remains tachy.   LVAD Interrogation HM 3: Speed: 5500 Flow 4.5  PI: 3.1 Power:4.0 VAD interrogated personally. Parameters stable.   Objective:   Weight Range: 48.2 kg Body mass index is 16.15 kg/m.   Vital Signs:  Maps 70-80s   Temp:  [98.6 F (37 C)-100 F (37.8 C)] 99.1 F (37.3 C) (02/07 0950) Pulse Rate:  [56-119] 113 (02/07 0958) Resp:  [18-28] 28 (02/07 0950) BP: (72-110)/(30-74) 84/62 (02/07 0950) SpO2:  [97 %-99 %] 98 % (02/07 0950) Weight:  [48.2 kg] 48.2 kg (02/07 0401) Last BM Date: 03/13/19  Weight change: Filed Weights   03/12/19 0342 03/13/19 0341 03/14/19 0401  Weight: 49 kg 48.8 kg 48.2 kg    Intake/Output:   Intake/Output Summary (Last 24 hours) at 03/14/2019 1237 Last data filed at 03/14/2019 1000 Gross per 24 hour  Intake 1810 ml  Output 2950 ml  Net -1140 ml      Physical Exam   Physical Exam: CVP 8-9 General:  Thin male NAD.  HEENT: normal Neck: supple. JVP 8-9 with prominent CV wavesCarotids 2+ bilat; no bruits. No lymphadenopathy or thryomegaly appreciated. Cor: PMI nondisplaced. Regular rate & rhythm. No rubs, gallops or murmurs. Lungs: clear Abdomen: soft, nontender, nondistended. No hepatosplenomegaly. No bruits or masses. Good bowel sounds. Extremities: no cyanosis, clubbing, rash, edema Neuro: alert & orientedx3, cranial nerves grossly intact. moves all 4 extremities w/o difficulty. Affect pleasant   Telemetry   Sinus Tach 110-120 personally checked   Labs    CBC Recent Labs    03/13/19 0439 03/14/19 0409  WBC 19.1* 14.1*  HGB 8.3* 8.4*  HCT 26.1* 26.3*  MCV 92.6 93.6    PLT 654* 831*   Basic Metabolic Panel Recent Labs    03/13/19 0439 03/14/19 0409  NA 131* 132*  K 3.5 4.1  CL 99 98  CO2 23 25  GLUCOSE 123* 100*  BUN 8 7  CREATININE 0.55* 0.55*  CALCIUM 8.9 9.0  MG 1.7  --    Liver Function Tests No results for input(s): AST, ALT, ALKPHOS, BILITOT, PROT, ALBUMIN in the last 72 hours. No results for input(s): LIPASE, AMYLASE in the last 72 hours. Cardiac Enzymes No results for input(s): CKTOTAL, CKMB, CKMBINDEX, TROPONINI in the last 72 hours.  BNP: BNP (last 3 results) Recent Labs    02/27/19 0433 03/05/19 0056 03/12/19 0542  BNP 447.0* 427.2* 135.5*    ProBNP (last 3 results) No results for input(s): PROBNP in the last 8760 hours.   D-Dimer No results for input(s): DDIMER in the last 72 hours. Hemoglobin A1C No results for input(s): HGBA1C in the last 72 hours. Fasting Lipid Panel No results for input(s): CHOL, HDL, LDLCALC, TRIG, CHOLHDL, LDLDIRECT in the last 72 hours. Thyroid Function Tests No results for input(s): TSH, T4TOTAL, T3FREE, THYROIDAB in the last 72 hours.  Invalid input(s): FREET3  Other results:   Imaging    No results found.   Medications:     Scheduled Medications: . aspirin EC  81 mg Oral Daily  . bisacodyl  10 mg Oral Daily   Or  . bisacodyl  10 mg  Rectal Daily  . Chlorhexidine Gluconate Cloth  6 each Topical Daily  . chlorpheniramine-HYDROcodone  5 mL Oral Q12H  . digoxin  0.125 mg Oral Daily  . docusate sodium  200 mg Oral Daily  . furosemide  80 mg Oral Daily  . gabapentin  100 mg Oral BID  . guaiFENesin  600 mg Oral BID  . lactose free nutrition  237 mL Oral TID WC & HS  . mouth rinse  15 mL Mouth Rinse BID  . pantoprazole  40 mg Oral Daily  . sildenafil  20 mg Oral TID  . sodium chloride flush  10 mL Intravenous Q12H  . spironolactone  25 mg Oral Daily  . Warfarin - Pharmacist Dosing Inpatient   Does not apply q1800    Infusions: . sodium chloride Stopped (03/09/19 1500)     PRN Medications: sodium chloride, hydrALAZINE, lidocaine (PF), ondansetron (ZOFRAN) IV, oxyCODONE, sodium chloride flush, traMADol     Assessment/Plan   1. Acute/Chronic Systolic HF -> cardiogenic shock - ECHO at [facility] EF, 20%.  - ECHO repeated and showed severely reduced EF <20%. RV only mildly down - R/L cath 1/7 normal cors. Output ok   - s/p HM-3 on 02/26/2019 - Ramp echo completed - no change in speed.   - CO-OX 73 off milrinone  - Continue digoxin 0.125 mg daily, dig level 0.2 on 03/06/19 - CVP 8-9. Volume status ok  Continue lasix 80 mg daily. Can give extra as needed - Continue spiro to 25 mg daily.   - With tachycardia and normal co-ox will add low-dose b-blocker. (if doesn't tolerate can switch to ivabradine) - Renal function stable.   2. VAD -  s/p HM-3 on 02/26/2019. VAD interrogated personally. Parameters stable.  -  RV down some.  - Continue sildenafil 20 mg three times a day.  - LDH 289 - Continue ASA 81 - INR 2.1. INR Goal 2-2.5. Discussed dosing with PharmD personally. - DL site ok - Ramp ECHO completed 03/07/18. Continue Speed 5500.  - Mobilizie  3. Acute post-op respiratory failure with RUL PNA - Extubated 1/23.  - Finished fluconazole+ vancomycin - WBC down trending,  23>>21> 19.1. -> 14.1 - Continues with low grade fever overnight now resolved. ? atx - follow closely.  - continue IS  4. Acute blood loss anemia - keep HGB > 8.0 - s/p transfusion x 1 unit 1/27 for hgb of 7.6.  - Hgb stable today at 8.4  5. Elevated bilirubin - Resolved.   6. Moderate size right pneumothorax: new on CXR 1/25.  - s/p placement for Rt sided CT. F/u CXR showed complete resolution of apical pneumothorax.   7. Hypokalemia/ Hypomangesemia: - Keep K >4.0 and Mg > 2.0   - K 4.1 today  8. Deconditioning: - continue to ambulate w/ PT.  - May need HHPT   9. Left Pleural Effusion  - S/P Thoracentesis 2/3 with 1.1 liters removed.  - CXR 2/5 w/ persistent small left  pleural effusion.  - Denies dyspnea. Monitor    Length of Stay: 75  Arvilla Meres, MD  03/14/2019, 12:37 PM  Advanced Heart Failure Team Pager [phone] (M-F; 7a - 4p)  Please contact [facility] for night-coverage after hours (4p -7a ) and weekends on [email or web address]

## 2019-03-15 ENCOUNTER — Encounter (HOSPITAL_COMMUNITY): Payer: Self-pay | Admitting: Unknown Physician Specialty

## 2019-03-15 ENCOUNTER — Inpatient Hospital Stay (HOSPITAL_COMMUNITY): Payer: Medicaid - Out of State

## 2019-03-15 LAB — BASIC METABOLIC PANEL
Anion gap: 9 (ref 5–15)
BUN: 10 mg/dL (ref 6–20)
CO2: 28 mmol/L (ref 22–32)
Calcium: 9.5 mg/dL (ref 8.9–10.3)
Chloride: 98 mmol/L (ref 98–111)
Creatinine, Ser: 0.57 mg/dL — ABNORMAL LOW (ref 0.61–1.24)
GFR calc Af Amer: >60 mL/min (ref >60)
GFR calc non Af Amer: >60 mL/min (ref >60)
Glucose, Bld: 104 mg/dL — ABNORMAL HIGH (ref 70–99)
Potassium: 3.8 mmol/L (ref 3.5–5.1)
Sodium: 135 mmol/L (ref 135–145)

## 2019-03-15 LAB — CBC
HCT: 26.5 % — ABNORMAL LOW (ref 39.0–52.0)
Hemoglobin: 8.6 g/dL — ABNORMAL LOW (ref 13.0–17.0)
MCH: 29.5 pg (ref 26.0–34.0)
MCHC: 32.5 g/dL (ref 30.0–36.0)
MCV: 90.8 fL (ref 80.0–100.0)
Platelets: 689 10*3/uL — ABNORMAL HIGH (ref 150–400)
RBC: 2.92 MIL/uL — ABNORMAL LOW (ref 4.22–5.81)
RDW: 14 % (ref 11.5–15.5)
WBC: 12.4 10*3/uL — ABNORMAL HIGH (ref 4.0–10.5)
nRBC: 0 % (ref 0.0–0.2)

## 2019-03-15 LAB — COOXEMETRY PANEL
Carboxyhemoglobin: 1.3 % (ref 0.5–1.5)
Methemoglobin: 0.9 % (ref 0.0–1.5)
O2 Saturation: 62.2 %
Total hemoglobin: 10.4 g/dL — ABNORMAL LOW (ref 12.0–16.0)

## 2019-03-15 LAB — LACTATE DEHYDROGENASE: LDH: 252 U/L — ABNORMAL HIGH (ref 98–192)

## 2019-03-15 LAB — PROTIME-INR
INR: 1.8 — ABNORMAL HIGH (ref 0.8–1.2)
Prothrombin Time: 20.8 seconds — ABNORMAL HIGH (ref 11.4–15.2)

## 2019-03-15 MED ORDER — DIGOXIN 125 MCG PO TABS: 0.1250 mg | ORAL_TABLET | Freq: Every day | ORAL | 5 refills | Status: DC

## 2019-03-15 MED ORDER — FUROSEMIDE 80 MG PO TABS: 80.0000 mg | ORAL_TABLET | Freq: Every day | ORAL | 5 refills | Status: DC

## 2019-03-15 MED ORDER — SILDENAFIL CITRATE 20 MG PO TABS: 20.0000 mg | ORAL_TABLET | Freq: Three times a day (TID) | ORAL | 5 refills | Status: DC

## 2019-03-15 MED ORDER — CARVEDILOL 3.125 MG PO TABS: 3.1250 mg | ORAL_TABLET | Freq: Two times a day (BID) | ORAL | 5 refills | Status: DC

## 2019-03-15 MED ORDER — PANTOPRAZOLE SODIUM 40 MG PO TBEC: 40.0000 mg | DELAYED_RELEASE_TABLET | Freq: Every day | ORAL | 5 refills | Status: DC

## 2019-03-15 MED ORDER — GABAPENTIN 100 MG PO CAPS: 100.0000 mg | ORAL_CAPSULE | Freq: Two times a day (BID) | ORAL | 5 refills | Status: DC

## 2019-03-15 MED ORDER — ASPIRIN 81 MG PO TBEC: 81.0000 mg | DELAYED_RELEASE_TABLET | Freq: Every day | ORAL | 5 refills | Status: DC

## 2019-03-15 MED ORDER — WARFARIN SODIUM 5 MG PO TABS: 5.0000 mg | ORAL_TABLET | Freq: Every day | ORAL | 5 refills | Status: DC

## 2019-03-15 MED ORDER — SPIRONOLACTONE 25 MG PO TABS: 25.0000 mg | ORAL_TABLET | Freq: Every day | ORAL | 5 refills | Status: DC

## 2019-03-15 MED ORDER — WARFARIN SODIUM 5 MG PO TABS: 5.0000 mg | ORAL_TABLET | Freq: Every day | ORAL | Status: DC

## 2019-03-15 NOTE — Progress Notes (Addendum)
Patient ID: Wilver Tignor, male   DOB: [birth year], 34 y.o.   MRN: [id number]     Advanced Heart Failure Rounding Note  PCP-Cardiologist: No primary care provider on file.   Subjective:    Underwent HM-3 placement 02/26/2019.   AF overnight. WBC trending down, 14>>12. F/u CXR completed this am. Results pending.  CVP 7. Wt stable at 105 lb.    INR subtherapeutic at 1.8   No complaints. Denies CP. No dyspnea. Cough improved.   LVAD Interrogation HM 3: Speed: 5500 Flow 4.6  PI: 2.9 Power: 4.1 VAD interrogated personally. Parameters stable.   Objective:   Weight Range: 47.7 kg Body mass index is 15.99 kg/m.   Vital Signs:  Maps 70-80s   Temp:  [97.8 F (36.6 C)-100 F (37.8 C)] 98 F (36.7 C) (02/08 0318) Pulse Rate:  [56-117] 111 (02/08 0318) Resp:  [16-28] 16 (02/08 0318) BP: (72-106)/(58-89) 105/89 (02/08 0318) SpO2:  [95 %-98 %] 98 % (02/08 0318) Weight:  [47.7 kg] 47.7 kg (02/08 0318) Last BM Date: 03/13/19  Weight change: Filed Weights   03/13/19 0341 03/14/19 0401 03/15/19 0318  Weight: 48.8 kg 48.2 kg 47.7 kg    Intake/Output:   Intake/Output Summary (Last 24 hours) at 03/15/2019 0710 Last data filed at 03/15/2019 0300 Gross per 24 hour  Intake 710 ml  Output 1251 ml  Net -541 ml      Physical Exam   CVP 7 PHYSICAL EXAM: General:  Well appearing, thin young AAM. No respiratory difficulty HEENT: normal Neck: supple. no JVD. Carotids 2+ bilat; no bruits. No lymphadenopathy or thyromegaly appreciated. Cor: +LVAD HUM Lungs: clear Abdomen: soft, nontender, nondistended. No hepatosplenomegaly. No bruits or masses. Good bowel sounds. DL site stable. Clean/ dry dressing  Extremities: no cyanosis, clubbing, rash, edema Neuro: alert & oriented x 3, cranial nerves grossly intact. moves all 4 extremities w/o difficulty. Affect pleasant.    Telemetry   Sinus Tach low 100s-110s  personally checked   Labs    CBC Recent Labs    03/14/19 0409  03/15/19 0342  WBC 14.1* 12.4*  HGB 8.4* 8.6*  HCT 26.3* 26.5*  MCV 93.6 90.8  PLT 674* 689*   Basic Metabolic Panel Recent Labs    27/06/23 0439 03/13/19 0439 03/14/19 0409 03/15/19 0342  NA 131*   < > 132* 135  K 3.5   < > 4.1 3.8  CL 99   < > 98 98  CO2 23   < > 25 28  GLUCOSE 123*   < > 100* 104*  BUN 8   < > 7 10  CREATININE 0.55*   < > 0.55* 0.57*  CALCIUM 8.9   < > 9.0 9.5  MG 1.7  --   --   --    < > = values in this interval not displayed.   Liver Function Tests No results for input(s): AST, ALT, ALKPHOS, BILITOT, PROT, ALBUMIN in the last 72 hours. No results for input(s): LIPASE, AMYLASE in the last 72 hours. Cardiac Enzymes No results for input(s): CKTOTAL, CKMB, CKMBINDEX, TROPONINI in the last 72 hours.  BNP: BNP (last 3 results) Recent Labs    02/27/19 0433 03/05/19 0056 03/12/19 0542  BNP 447.0* 427.2* 135.5*    ProBNP (last 3 results) No results for input(s): PROBNP in the last 8760 hours.   D-Dimer No results for input(s): DDIMER in the last 72 hours. Hemoglobin A1C No results for input(s): HGBA1C in the last 72 hours. Fasting Lipid  Panel No results for input(s): CHOL, HDL, LDLCALC, TRIG, CHOLHDL, LDLDIRECT in the last 72 hours. Thyroid Function Tests No results for input(s): TSH, T4TOTAL, T3FREE, THYROIDAB in the last 72 hours.  Invalid input(s): FREET3  Other results:   Imaging    No results found.   Medications:     Scheduled Medications: . aspirin EC  81 mg Oral Daily  . bisacodyl  10 mg Oral Daily   Or  . bisacodyl  10 mg Rectal Daily  . carvedilol  3.125 mg Oral BID WC  . Chlorhexidine Gluconate Cloth  6 each Topical Daily  . chlorpheniramine-HYDROcodone  5 mL Oral Q12H  . digoxin  0.125 mg Oral Daily  . docusate sodium  200 mg Oral Daily  . furosemide  80 mg Oral Daily  . gabapentin  100 mg Oral BID  . guaiFENesin  600 mg Oral BID  . lactose free nutrition  237 mL Oral TID WC & HS  . mouth rinse  15 mL Mouth  Rinse BID  . pantoprazole  40 mg Oral Daily  . sildenafil  20 mg Oral TID  . sodium chloride flush  10 mL Intravenous Q12H  . spironolactone  25 mg Oral Daily  . Warfarin - Pharmacist Dosing Inpatient   Does not apply q1800    Infusions: . sodium chloride Stopped (03/09/19 1500)    PRN Medications: sodium chloride, hydrALAZINE, lidocaine (PF), ondansetron (ZOFRAN) IV, oxyCODONE, sodium chloride flush, traMADol     Assessment/Plan   1. Acute/Chronic Systolic HF -> cardiogenic shock - ECHO at [facility] EF, 20%.  - ECHO repeated and showed severely reduced EF <20%. RV only mildly down - R/L cath 1/7 normal cors. Output ok   - s/p HM-3 on 02/26/2019 - Ramp echo completed - no change in speed.   - CO-OX 62 off milrinone  - Continue digoxin 0.125 mg daily, dig level 0.2 on 03/06/19 - CVP 7. Volume status ok  Continue lasix 80 mg daily. Can give extra as needed - Continue spiro to 25 mg daily.   - Continue Coreg, 3.125 bid (MAPs 80s) (if doesn't tolerate can switch to ivabradine) - Renal function stable.   2. VAD -  s/p HM-3 on 02/26/2019. VAD interrogated personally. Parameters stable.  -  RV down some.  - Continue sildenafil 20 mg three times a day.  - LDH 256 - Continue ASA 81 - INR 1.8. INR Goal 2-2.5. Discussed dosing with PharmD personally. - DL site ok - Ramp ECHO completed 03/07/18. Continue Speed 5500.  - Mobilizie  3. Acute post-op respiratory failure with RUL PNA - Extubated 1/23.  - Finished fluconazole+ vancomycin - WBC down trending,  23>>21> 19.1. -> 14.1>>12.4 - AF overnight - follow closely.  - continue IS  4. Acute blood loss anemia - keep HGB > 8.0 - s/p transfusion x 1 unit 1/27 for hgb of 7.6.  - Hgb stable today at 8.6  5. Elevated bilirubin - Resolved.   6. Moderate size right pneumothorax: new on CXR 1/25.  - s/p placement for Rt sided CT. F/u CXR showed complete resolution of apical pneumothorax.   7. Hypokalemia/ Hypomangesemia: - Keep K  >4.0 and Mg > 2.0   - K 3.8 today - supplement   8. Deconditioning: - continue to ambulate w/ PT.  - May need HHPT   9. Left Pleural Effusion  - S/P Thoracentesis 2/3 with 1.1 liters removed.  - CXR 2/5 w/ persistent small left pleural effusion.  - Denies  dyspnea. Monitor    Length of Stay: [address] PA-C  03/15/2019, 7:10 AM  Advanced Heart Failure Team Pager [phone] (M-F; 7a - 4p)  Please contact [facility] for night-coverage after hours (4p -7a ) and weekends on [email or web address]  Patient seen and examined with the above-signed Advanced Practice Provider and/or Housestaff. I personally reviewed laboratory data, imaging studies and relevant notes. I independently examined the patient and formulated the important aspects of the plan. I have edited the note to reflect any of my changes or salient points. I have personally discussed the plan with the patient and/or family.  He continues to improve. Now AF. WBC trending down. Ambulating halls. Can handle controller. Co-ox 62% INR 1.8. VAD interrogated personally. Parameters stable.  He is ready for d/c today. Mother at bedside to complete teaching and transport home.  Arvilla Meres, MD  2:46 PM

## 2019-03-15 NOTE — Progress Notes (Signed)
Discussed IS/flutter, sternal precautions, exercise guidelines, diet (importance of protein, veggies, fruit and decreased sodium), smoking cessation and CRPII. Pt voiced understanding except does not want to quit smoking. Discussed with him the risks of nicotine/smoke and gave resources. Will refer to CRPII in [place]. [id number] Ethelda Chick CES, ACSM 10:03 AM 03/15/2019

## 2019-03-15 NOTE — Progress Notes (Signed)
LVAD Coordinator Rounding Note:  Admitted 02/10/19 due to CHF from OSH .   HM3 LVAD implanted on 02/26/19 by PVT under DT criteria.  Pt sitting up in bed watching TV. States that he is ready to go home today. Pt states that he is able to walk in the hall with minimal assistance.  Vital signs: Temp: 98.6 HR: 102 Doppler Pressure: 70 Automatic cuff: 106/83 (92) O2 Sat: 98% on RA Wt: 106.4>116.4>128>112>115>115.5>110.4>108.6>109.3>108>105.1 lbs   LVAD interrogation reveals:  Speed: 5500 Flow: 4.7 Power: 4.1 w PI: 2.2 Alarms: none Events:  5 PI events today Hematocrit: 26  Fixed speed: 5500 Low speed limit: 5200   Drive Line: Pt currently having every other day dressing changes by VAD coordinator, nurse champion or trained caregiver. Existing VAD dressing removed and site care performed using sterile technique by pts mother-Nora under by observation. VAD coordinator removed proximal stitch and all CT sutures. Drive line exit site cleaned with Chlora prep applicators x 2, allowed to dry, and gauze dressing with silver strip re-applied. Exit site healing and unincorporated, the velour is fully implanted at exit site. No redness, tenderness, drainage, foul odor or rash noted. Drive line anchor secure x 2. May advance to twice a week dressing changes.       Labs:  LDH trend: 243>418>431>412>373>352>355>379>351>322>328>311>304>252  INR trend: 1.3>1.9>1.5>1.2>1.5>1.5>2.0>2.0>2.6>1.8>2.1>1.9>1.8  Wbc: 20.7>22.4>21.6>23.2>21.1  Anticoagulation Plan: -INR Goal: 2-2.5 -ASA Dose: 81 mg  Blood Products:  -Intra op: 2 u PRBCS 6 FFP 1 PLT  -Post-op: - 03/03/19>>1 unit PCs  Device: -N/A  Gtts: Milrinone 0.125 mcg/kg/min>>off Heparin 800 units/hr>>off  Infection: Urine cx 03/06/19>> No growth Blood cx 03/06/19>> NGTD  Respiratory: Extubated 02/27/19 03/01/19>>40% BiPAP added   03/01/19>>CT placed for right pnuemothorax 03/02/19>>O2 per [place] 03/03/19>>RA  Arrhythmia:  -  02/28/19>>Amiodarone 30 mg/hr started for atrial tachycardia>>off  Nitric Oxide:  03/02/19>>off   VAD Education: Discharge teaching completed 03/12/19. Had patient document in his daily log folder this morning. Provided with 2 LVAD emergency bracelets; placed in black bag for now while in hospital. pts mother [name] did excellent with driveline dressing care today and states she feels ready to do the dressing indepently.   Plan/Recommendations:  1. Page VAD coordinator w/any equipment issues or concerns or driveline management questions. 2. Pt ok to d/c home from VAD coordinator persepective. All equipment has been delivered to the pt along with a scale and pill box. All VAD equipment in the room belongs to the pt.   Carlton Adam RN VAD Coordinator  Office: [phone]  24/7 Pager: [phone]

## 2019-03-15 NOTE — Plan of Care (Signed)
  Problem: Clinical Measurements: Goal: Will remain free from infection Outcome: Progressing   Problem: Elimination: Goal: Will not experience complications related to urinary retention Outcome: Progressing   Problem: Skin Integrity: Goal: Risk for impaired skin integrity will decrease Outcome: Progressing   Problem: Activity: Goal: Capacity to carry out activities will improve Outcome: Progressing   Problem: Education: Goal: Knowledge of the prescribed therapeutic regimen will improve Outcome: Progressing   Problem: Activity: Goal: Risk for activity intolerance will decrease Outcome: Progressing   Problem: Activity: Goal: Risk for activity intolerance will decrease Outcome: Progressing   Problem: Coping: Goal: Level of anxiety will decrease Outcome: Progressing

## 2019-03-15 NOTE — Progress Notes (Signed)
ANTICOAGULATION CONSULT NOTE  Pharmacy Consult for Warfarin Indication: LVAD  No Known Allergies  Patient Measurements: Height: 5\' 8"  (172.7 cm) Weight: 105 lb 2.6 oz (47.7 kg) IBW/kg (Calculated) : 68.4  Vital Signs: Temp: 98 F (36.7 C) (02/08 0318) Temp Source: Oral (02/08 0318) BP: 105/89 (02/08 0318) Pulse Rate: 111 (02/08 0318)  Labs: Recent Labs    03/13/19 0439 03/13/19 0439 03/14/19 0409 03/15/19 0342  HGB 8.3*   < > 8.4* 8.6*  HCT 26.1*  --  26.3* 26.5*  PLT 654*  --  674* 689*  LABPROT 24.4*  --  23.3* 20.8*  INR 2.2*  --  2.1* 1.8*  CREATININE 0.55*  --  0.55* 0.57*   < > = values in this interval not displayed.    Estimated Creatinine Clearance: 88.6 mL/min (A) (by C-G formula based on SCr of 0.57 mg/dL (L)).   Medical History: Past Medical History:  Diagnosis Date  . Dilated cardiomyopathy (HCC)   . Dilated cardiomyopathy (HCC) 02/2019  . Polysubstance abuse [facility])     Assessment: 34 year old male with LVAD placed 1/22 on Warfarin therapy with goal INR 2.0 to 2.5. Patient has been on warfarin since 1/24. INR had been therapeutic on an average of 5mg  daily.   INR fell to 1.8 today <  goal - has been on/off warfarin for thoracentesis 1.1 L removed and added fluconazole for 3 days (possible DDI with warfarin) - completed on 2/5.   No bleeding, Hgb stable in 8s plt 600s. LDH 280-300 stable.  Boost drink daily   Goal of Therapy:  INR goal 2.0 to 2.5 Monitor platelets by anticoagulation protocol: Yes   Plan:  Warfarin 5 mg daily  - should be ok dose to DC home on as well with close follow up in clinic Monitor INR daily for now Monitor for any signs or symptoms of bleeding.   2/24 Pharm.D. CPP, BCPS Clinical Pharmacist [phone] 03/15/2019 7:31 AM    Please check [email or web address] for unit-specific pharmacist phone numbers

## 2019-03-15 NOTE — Discharge Summary (Signed)
Advanced Heart Failure Team  Discharge Summary   Patient ID: Jon Martin MRN: [id number], DOB/AGE: May 26, 1985 34 y.o. Admit date: 02/09/2019 D/C date:     03/15/2019   Primary Discharge Diagnoses:  Cardiogenic Shock, Inotrope Dependent Acute on Chronic Systolic Heart Failure Nonischemic Dilated Cardiomyopathy S/p LVAD HM-3 implant 02/26/19 H/o Polysubstance Abuse  Acute Hypoxic Respiratory Failure Pneumonia Moderate Right Sided Pneumothorax, requiring Chest Tube Placement  Acute Post Operative Blood Loss Anemia requiring transfusion Left Sided Pleural Effusion requiring Thoracentesis  Anticoagulation Therapy w/ Coumadin  Atrial Tachycardia    Hospital Course:  34 y/o AAM w/ h/o nonischemic, dilated cardiomyopathy w/ EF 20%, first diagnosed in [place]. Prior h/o polysubstance abuse (cocaine use and ETOH). Multiple hospitalizations for a/c systolic HF w/ low output requiring inotropes at outside hospital in [place] prior to this admit. Essentially becoming inotrope dependent. Was transferred to [facility] on 02/10/19 for advanced heart failure consultation for potential LVAD. Was transferred on dobutamine 2.5 mcg/kg/min. Initial Co-ox 21% upon admit to [facility]. Dobutamine further increased to 7.5. Also started on abx on admission. CXR showed possible PNA and procalcitonin elevated, 0.38>0.44>0.69. Placed on rocephin + doxycycline.   Taken for emergent [facility] on 1/7 for low co-ox in the 20s, however cath with co-ox in 60-70 range so Impella placement deferred.  Angiography showed normal coronaries. EF 10-15%. Fick cardiac output/index = 4.6/2.9 on dobutamine. Swan placed in cath lab. Developed atrial tach during cath and started on IV amio. Converted to NSR. Later required addition of milrinone to dobutamine for persistent shock w/ low co-ox ranging from 29-24%. He remained inotrope depended. He was evaluated for LVAD and felt to be a candidate.   Underwent HM-3 LVAD placement on 02/26/19 by Dr. Morton Peters. Extubated  1/25. Required milrinone post op for RV support. Milrinone eventually weaned and Co-ox remained stable of inotropes at 61%. Later placed on sildenafil for continued RV support.   Post op recovery complicated by hypoxic respiratory failure, moderate sized rt pneumothorax requiring CT placement. CT removed 1/28. Also developed post operative PNA, treated w/ meropenmum + vancomycin. Fluconazole later added.  Developed anemia w/ drop in Hgb to 7.6, requiring 1 unit PRBCs on 1/27 w/ appropriate rise in Hgb. H/H remained stable post transfusion.  Developed Lt pleural effusion requiring thoracentesis on 2/3 w/ 1.1 L fluid removal. GS ok. CXR followed and improved.   VAD parameters remained stable. Ramp ECHO completed 03/07/18. Continue Speed 5500. On ASA + Coumadin w/ INR goal 2-2.5.   He progressed w/ PT and therapist recommended continuation of home health PT at d/c.   Volume status throughout admit managed w/ Lasix. Transitioned to 80 mg PO daily. Also treated w/ Spironolactone and digoxin.  Low dose  blocker was ultimately added, coreg 3.125 mg, (given tachycardia and normal co-ox). He tolerated addition ok.   On 2/8, he was seen by Dr. Gala Romney and Dr. Donata Clay and felt stable for d/c home. Pt and family received LVAD training prior to d/c. Home Health PT ordered. LVAD clinic f/u scheduled for 2/15.     Cardiac Studies  2D echo 02/10/19 1. Left ventricular ejection fraction, by visual estimation, is <20%. The left ventricle has severely decreased function. There is no left ventricular hypertrophy. 2. The left ventricle demonstrates global hypokinesis. 3. Severely dilated left ventricular internal cavity size. 4. Left ventricular diastolic parameters are consistent with Grade III diastolic dysfunction (restrictive). 5. Elevated left atrial pressure. 6. Global right ventricle has normal systolic function.The right ventricular size is normal.  7. Left atrial size was severely dilated. 8. Right  atrial size was normal. 9. The mitral valve is abnormal. Severe mitral valve regurgitation. 10. The tricuspid valve is abnormal. Moderate to severe tricuspid regurgitation 11. The aortic valve is tricuspid. Aortic valve regurgitation is not visualized. No evidence of aortic valve stenosis. 12. The pulmonic valve was not well visualized. Pulmonic valve regurgitation is not visualized. 13. The inferior vena cava is dilated in size with <50% respiratory variability, suggesting right atrial pressure of 15 mmHg. 14. The tricuspid regurgitant velocity is 2.97 m/s, and with an assumed right atrial pressure of 15 mmHg, the estimated right ventricular systolic pressure is moderately elevated at 50.3 mmHg.  [facility] 02/11/19 Findings:  On dobutamine at 7.5 mcg/kg/min  Ao = 85/71 (78) LV = 90/16 RA = 10/7 RV = 32/3 PA = 37/22 (28) PCW = 16 Fick cardiac output/index = 4.6/2.9 Thermo CO/CI = 2.8/1.8 (after development of AFL) Ao sat = 97% PA sat = 70%, 71% SVC sat 68%   Assessment: 1. Normal coronary arteries 2. Severe NICM EF 10-15% 3. Improved hemodynamics on dobutamine 7.5 4. Development of rapid atrial tach during procedure  INR Goal:  2-2.5  Discharge Weight Range: 105 lb  Discharge Vitals: Blood pressure 106/83, pulse (!) 111, temperature 98.6 F (37 C), temperature source Oral, resp. rate 16, height 5\' 8"  (1.727 m), weight 47.7 kg, SpO2 98 %.   LVAD Interrogation HM 3: Speed: 5500 Flow 4.6  PI: 2.9 Power: 4.1 VAD interrogated personally. Parameters stable.  Labs: Lab Results  Component Value Date   WBC 12.4 (H) 03/15/2019   HGB 8.6 (L) 03/15/2019   HCT 26.5 (L) 03/15/2019   MCV 90.8 03/15/2019   PLT 689 (H) 03/15/2019    Recent Labs  Lab 03/15/19 0342  NA 135  K 3.8  CL 98  CO2 28  BUN 10  CREATININE 0.57*  CALCIUM 9.5  GLUCOSE 104*   Lab Results  Component Value Date   CHOL 89 02/11/2019   HDL 25 (L) 02/11/2019   LDLCALC 52 02/11/2019   TRIG 58 02/11/2019    BNP (last 3 results) Recent Labs    02/27/19 0433 03/05/19 0056 03/12/19 0542  BNP 447.0* 427.2* 135.5*    ProBNP (last 3 results) No results for input(s): PROBNP in the last 8760 hours.   Diagnostic Studies/Procedures   DG Chest 2 View  Result Date: 03/15/2019 CLINICAL DATA:  34 year old male with left ventricular assist device in place EXAM: CHEST - 2 VIEW COMPARISON:  Prior chest x-ray 03/12/2019 FINDINGS: Patient is status post median sternotomy. A left ventricular assist device is in place overlying the left ventricle. Cardiomegaly is unchanged. Slightly improved aeration with decreased perihilar atelectasis. Persistent dense left basilar airspace opacity likely representing a left-sided pleural effusion. Small amount of fluid tracks along the major fissure. No pneumothorax. No acute osseous abnormality. A right upper extremity PICC is present. The catheter tip terminates in the distal SVC. IMPRESSION: 1. Improved aeration with slightly decreased perihilar atelectasis. 2. Persistent left basilar airspace opacity likely reflecting the presence of a moderately large pleural effusion and associated atelectasis. 3. Unchanged appearance of left ventricular assist device. Electronically Signed   By: 05/10/2019 M.D.   On: 03/15/2019 07:40    Discharge Medications   Allergies as of 03/15/2019   No Known Allergies     Medication List    STOP taking these medications   metoprolol tartrate 25 MG tablet Commonly known as: LOPRESSOR  TAKE these medications   aspirin 81 MG EC tablet Take 1 tablet (81 mg total) by mouth daily. Start taking on: March 16, 2019   carvedilol 3.125 MG tablet Commonly known as: COREG Take 1 tablet (3.125 mg total) by mouth 2 (two) times daily with a meal.   digoxin 0.125 MG tablet Commonly known as: LANOXIN Take 1 tablet (0.125 mg total) by mouth daily. Start taking on: March 16, 2019   furosemide 80 MG tablet Commonly known as:  LASIX Take 1 tablet (80 mg total) by mouth daily. Start taking on: March 16, 2019 What changed:   medication strength  how much to take   gabapentin 100 MG capsule Commonly known as: NEURONTIN Take 1 capsule (100 mg total) by mouth 2 (two) times daily.   pantoprazole 40 MG tablet Commonly known as: PROTONIX Take 1 tablet (40 mg total) by mouth daily. Start taking on: March 16, 2019   sildenafil 20 MG tablet Commonly known as: REVATIO Take 1 tablet (20 mg total) by mouth 3 (three) times daily.   spironolactone 25 MG tablet Commonly known as: ALDACTONE Take 1 tablet (25 mg total) by mouth daily. Start taking on: March 16, 2019   warfarin 5 MG tablet Commonly known as: COUMADIN Take 1 tablet (5 mg total) by mouth daily at 6 PM.       Disposition   The patient will be discharged in stable condition to home. Discharge Instructions    Amb Referral to Cardiac Rehabilitation   Complete by: As directed    To [facility]   Diagnosis: Heart Failure (see criteria below if ordering Phase II)   Heart Failure Type: Chronic Systolic & Diastolic   After initial evaluation and assessments completed: Virtual Based Care may be provided alone or in conjunction with Phase 2 Cardiac Rehab based on patient barriers.: Yes   Face-to-face encounter (required for Medicare/Medicaid patients)   Complete by: As directed    I Lyda Jester certify that this patient is under my care and that I, or a nurse practitioner or physician's assistant working with me, had a face-to-face encounter that meets the physician face-to-face encounter requirements with this patient on 03/12/2019. The encounter with the patient was in whole, or in part for the following medical condition(s) which is the primary reason for home health care (List medical condition): chronic systolic heart failure   The encounter with the patient was in whole, or in part, for the following medical condition, which is the primary  reason for home health care: chronic systolic heart failure   I certify that, based on my findings, the following services are medically necessary home health services: Physical therapy   Reason for Medically Necessary Home Health Services: Therapy- Home Adaptation to Facilitate Safety   My clinical findings support the need for the above services: Unable to leave home safely without assistance and/or assistive device   Further, I certify that my clinical findings support that this patient is homebound due to: Unable to leave home safely without assistance   Home Health   Complete by: As directed    To provide the following care/treatments: PT     Follow-up Information    [facility] Follow up.   Why: HHPT Contact information: [place] [phone] Follow up on 03/22/2019.   Specialty: Cardiology Why: Advanced Heart Failure Clinic (VAD  Clinic) 11:00 AM Parking Garage Code [id number]   Contact information: [address] [id number] Wilhemina Bonito [place] [address] [phone]            Duration of Discharge Encounter: Greater than 35 minutes   Signed, Knute Neu 03/15/2019, 2:46 PM

## 2019-03-15 NOTE — Plan of Care (Signed)
°  Problem: Education: °Goal: Knowledge of General Education information will improve °Description: Including pain rating scale, medication(s)/side effects and non-pharmacologic comfort measures °Outcome: Progressing °  °Problem: Health Behavior/Discharge Planning: °Goal: Ability to manage health-related needs will improve °Outcome: Progressing °  °Problem: Clinical Measurements: °Goal: Ability to maintain clinical measurements within normal limits will improve °Outcome: Progressing °Goal: Will remain free from infection °Outcome: Progressing °Goal: Diagnostic test results will improve °Outcome: Progressing °Goal: Respiratory complications will improve °Outcome: Progressing °Goal: Cardiovascular complication will be avoided °Outcome: Progressing °  °Problem: Activity: °Goal: Risk for activity intolerance will decrease °Outcome: Progressing °  °Problem: Nutrition: °Goal: Adequate nutrition will be maintained °Outcome: Progressing °  °Problem: Coping: °Goal: Level of anxiety will decrease °Outcome: Progressing °  °Problem: Elimination: °Goal: Will not experience complications related to bowel motility °Outcome: Progressing °Goal: Will not experience complications related to urinary retention °Outcome: Progressing °  °Problem: Pain Managment: °Goal: General experience of comfort will improve °Outcome: Progressing °  °Problem: Safety: °Goal: Ability to remain free from injury will improve °Outcome: Progressing °  °Problem: Skin Integrity: °Goal: Risk for impaired skin integrity will decrease °Outcome: Progressing °  °Problem: Education: °Goal: Ability to demonstrate management of disease process will improve °Outcome: Progressing °Goal: Ability to verbalize understanding of medication therapies will improve °Outcome: Progressing °Goal: Individualized Educational Video(s) °Outcome: Progressing °  °Problem: Activity: °Goal: Capacity to carry out activities will improve °Outcome: Progressing °  °Problem: Cardiac: °Goal:  Ability to achieve and maintain adequate cardiopulmonary perfusion will improve °Outcome: Progressing °  °Problem: Education: °Goal: Knowledge of the prescribed therapeutic regimen will improve °Outcome: Progressing °  °Problem: Activity: °Goal: Risk for activity intolerance will decrease °Outcome: Progressing °  °Problem: Cardiac: °Goal: Ability to maintain an adequate cardiac output will improve °Outcome: Progressing °  °Problem: Coping: °Goal: Level of anxiety will decrease °Outcome: Progressing °  °Problem: Fluid Volume: °Goal: Risk for excess fluid volume will decrease °Outcome: Progressing °  °Problem: Clinical Measurements: °Goal: Ability to maintain clinical measurements within normal limits will improve °Outcome: Progressing °Goal: Will remain free from infection °Outcome: Progressing °  °Problem: Respiratory: °Goal: Will regain and/or maintain adequate ventilation °Outcome: Progressing °  °

## 2019-03-15 NOTE — Progress Notes (Signed)
CSW met with patient and reviewed discharge plan. Patient will follow up with SSI application, Free phone application through [employer] and transport options through [employer]. Patient assisted with medications and phone minutes for cell phone upon discharge and verbalizes understanding of follow up and support through [place] if needed. CSW will follow up with patient on next clinic visit. [name], [place], [place]

## 2019-03-19 ENCOUNTER — Ambulatory Visit (HOSPITAL_COMMUNITY): Payer: Self-pay | Admitting: Pharmacist

## 2019-03-19 ENCOUNTER — Other Ambulatory Visit (HOSPITAL_COMMUNITY): Payer: Self-pay | Admitting: Unknown Physician Specialty

## 2019-03-19 DIAGNOSIS — Z95811 Presence of heart assist device: Secondary | ICD-10-CM

## 2019-03-19 DIAGNOSIS — Z7901 Long term (current) use of anticoagulants: Secondary | ICD-10-CM

## 2019-03-19 LAB — CULTURE, BODY FLUID W GRAM STAIN -BOTTLE: Culture: NO GROWTH

## 2019-03-19 LAB — POCT INR: INR: 2.5 (ref 2.0–3.0)

## 2019-03-19 NOTE — Progress Notes (Signed)
LVAD INR 

## 2019-03-22 ENCOUNTER — Encounter (HOSPITAL_COMMUNITY): Payer: Medicaid - Out of State

## 2019-03-24 ENCOUNTER — Inpatient Hospital Stay (HOSPITAL_COMMUNITY): Payer: Medicaid - Out of State | Admitting: Internal Medicine

## 2019-03-24 ENCOUNTER — Telehealth (HOSPITAL_COMMUNITY): Payer: Self-pay | Admitting: Licensed Clinical Social Worker

## 2019-03-24 NOTE — Telephone Encounter (Signed)
CSW contacted patient's home to follow up on upcoming appointment. Patient's mother answered the phone and stated that patient is doing well and the dressing changes are going fine. She reports they have transportation for Monday's appointment and will be at the clinic at 1pm. CSW encouraged mom to call DSS to ask about reimbursement for mileage from [facility] for transport. Mom states she will follow up and see CSW on Monday. CSW continues to follow as needed. Lasandra Beech, LCSW, CCSW-MCS [phone]

## 2019-03-25 ENCOUNTER — Telehealth (HOSPITAL_COMMUNITY): Payer: Self-pay | Admitting: Licensed Clinical Social Worker

## 2019-03-25 ENCOUNTER — Encounter (HOSPITAL_COMMUNITY): Payer: Medicaid - Out of State

## 2019-03-25 NOTE — Telephone Encounter (Signed)
CSW received call from patient's mother stating that she can't find patients medicaid card. CSW provided mom with the number from scanned insurance card. Mom confirmed appointment on Monday and will follow up with CSW at that time. Lasandra Beech, LCSW, CCSW-MCS [phone]

## 2019-03-29 ENCOUNTER — Ambulatory Visit (HOSPITAL_COMMUNITY): Payer: Self-pay | Admitting: Pharmacist

## 2019-03-29 ENCOUNTER — Ambulatory Visit (HOSPITAL_COMMUNITY)
Admission: RE | Admit: 2019-03-29 | Discharge: 2019-03-29 | Disposition: A | Discharge status: Home / Self Care | Payer: Medicaid - Out of State | Source: Ambulatory Visit | Attending: Cardiology | Admitting: Cardiology

## 2019-03-29 ENCOUNTER — Other Ambulatory Visit: Payer: Self-pay

## 2019-03-29 ENCOUNTER — Encounter (HOSPITAL_COMMUNITY): Payer: Self-pay

## 2019-03-29 VITALS — BP 97/44 | HR 92 | Temp 98.2°F | Ht 68.0 in | Wt 122.6 lb

## 2019-03-29 DIAGNOSIS — I5043 Acute on chronic combined systolic (congestive) and diastolic (congestive) heart failure: Secondary | ICD-10-CM

## 2019-03-29 DIAGNOSIS — Z95811 Presence of heart assist device: Secondary | ICD-10-CM | POA: Diagnosis present

## 2019-03-29 DIAGNOSIS — I454 Nonspecific intraventricular block: Secondary | ICD-10-CM | POA: Diagnosis not present

## 2019-03-29 DIAGNOSIS — Z7901 Long term (current) use of anticoagulants: Secondary | ICD-10-CM | POA: Diagnosis not present

## 2019-03-29 DIAGNOSIS — I5022 Chronic systolic (congestive) heart failure: Secondary | ICD-10-CM | POA: Diagnosis not present

## 2019-03-29 DIAGNOSIS — R9431 Abnormal electrocardiogram [ECG] [EKG]: Secondary | ICD-10-CM | POA: Insufficient documentation

## 2019-03-29 LAB — COMPREHENSIVE METABOLIC PANEL
ALT: 21 U/L (ref 0–44)
AST: 27 U/L (ref 15–41)
Albumin: 3.7 g/dL (ref 3.5–5.0)
Alkaline Phosphatase: 71 U/L (ref 38–126)
Anion gap: 10 (ref 5–15)
BUN: 11 mg/dL (ref 6–20)
CO2: 27 mmol/L (ref 22–32)
Calcium: 10.1 mg/dL (ref 8.9–10.3)
Chloride: 98 mmol/L (ref 98–111)
Creatinine, Ser: 0.72 mg/dL (ref 0.61–1.24)
GFR calc Af Amer: >60 mL/min (ref >60)
GFR calc non Af Amer: >60 mL/min (ref >60)
Glucose, Bld: 97 mg/dL (ref 70–99)
Potassium: 3.7 mmol/L (ref 3.5–5.1)
Sodium: 135 mmol/L (ref 135–145)
Total Bilirubin: 0.8 mg/dL (ref 0.3–1.2)
Total Protein: 8.3 g/dL — ABNORMAL HIGH (ref 6.5–8.1)

## 2019-03-29 LAB — CBC
HCT: 35.2 % — ABNORMAL LOW (ref 39.0–52.0)
Hemoglobin: 10.9 g/dL — ABNORMAL LOW (ref 13.0–17.0)
MCH: 29 pg (ref 26.0–34.0)
MCHC: 31 g/dL (ref 30.0–36.0)
MCV: 93.6 fL (ref 80.0–100.0)
Platelets: 334 10*3/uL (ref 150–400)
RBC: 3.76 MIL/uL — ABNORMAL LOW (ref 4.22–5.81)
RDW: 16.1 % — ABNORMAL HIGH (ref 11.5–15.5)
WBC: 8.7 10*3/uL (ref 4.0–10.5)
nRBC: 0 % (ref 0.0–0.2)

## 2019-03-29 LAB — LACTATE DEHYDROGENASE: LDH: 208 U/L — ABNORMAL HIGH (ref 98–192)

## 2019-03-29 LAB — PROTIME-INR
INR: 3 — ABNORMAL HIGH (ref 0.8–1.2)
Prothrombin Time: 30.9 seconds — ABNORMAL HIGH (ref 11.4–15.2)

## 2019-03-29 MED ORDER — METOPROLOL TARTRATE 25 MG PO TABS: 12.5000 mg | ORAL_TABLET | Freq: Every day | ORAL | 3 refills | Status: DC

## 2019-03-29 MED ORDER — FUROSEMIDE 80 MG PO TABS: ORAL_TABLET | ORAL | 5 refills | Status: DC

## 2019-03-29 MED ORDER — WARFARIN SODIUM 5 MG PO TABS: ORAL_TABLET | ORAL | 5 refills | Status: DC

## 2019-03-29 NOTE — Patient Instructions (Signed)
1. Decrease furosemide to Monday and Friday only. May take extra dose per week if lower leg swelling or cough returns. 2. PharmD will call with INR results and warfarin dosing. 3. Return to VAD clinic in one week.  4. Advance to weekly dressing changes.

## 2019-03-29 NOTE — Progress Notes (Addendum)
Jon Martin presents for hospital discharge  in [facility] today with mother. Reports no problems with VAD equipment or concerns with drive line.  Mother brought home medications. She reports he never got gabapentin filled because Medicaid would not pay for med. Pt denies significant pocket pain. Reports he has "sometimes when I cough". Cough much improved, none noted during visit today. He is still taking Metoprolol 12.5 mg daily. Reviewed with Dr. Haroldine Laws, will continue same.   Pt says he has gained weight at home due to increased appetite and eating more.   Higher of PI events noted on VAD parameter history. He reports he is drinking > 2 liters/day of fluid. He is currently taking Lasix 80 mg daily. Dr. Haroldine Laws assessed and decreased Lasix to 80 mg Mon and Friday; may take extra dose if he develops leg swelling or cough returns. Pt/mother verbalized understanding of same.   Mother reports power was off from Friday until Monday; pt charged batteries at relative's house.   Home physical therapy signed off last week. Local lab info given with address and contact information. Audry Riles, PharmD met Jon Martin and mother during visit. She will call them with INR results and warfarin dosing.  [facility]  [place], [place]  Phone: [phone]  Mother has been performing dressing changes twice weekly; last dressing change was Friday. See note below.  Vital Signs:  Temp: 98.2 Doppler Pressure: 80 Automatc BP: 97/44 (73) HR:  92 SPO2: 92%  Weight: 122.6  lb w/o eqt Hospital d/c wt: 105 lbs   VAD Indication: Destination Therapy LVAD due to   LVAD assessment: HM III: Speed:  5500 rpms Flow: 4.1 Power: 4.2 w    PI: 3.4 Alarms: no external power 2/21 (loss of power) Events: > 100 PI events 03/28/19  Fixed speed: 5500 Low speed limit: 5200  Primary Controller: Replace back up battery in 26 months Back up controller: Replace  back up battery in 42month   I reviewed the LVAD parameters from today and compared the results to the Jon Martin's prior recorded data. LVAD interrogation was NEGATIVE for significant power changes, NEGATIVE for clinical alarms and STABLE for PI events/speed drops. No programming changes were made and pump is functioning within specified parameters. Pt is performing daily controller and system monitor self tests along with completing weekly and monthly maintenance for LVAD equipment.  LVAD equipment check completed and is in good working order. Back-up equipment present.   Annual Equipment Maintenance on UBC/PM was performed 2/21.  Exit Site Care: VAD dressing and anchor removed and site care performed using sterile technique. Drive line exit site cleaned with Chlora prep applicators x 2, allowed to dry, and Sorbaview dressing and bio patch applied. Exit site well healed and incorporated.The velour is fully implanted at exit site. Large dry exudate removed with cleansing, no redness, tenderness, drainage, or foul odor noted. Drive line anchor re-applied. Pt denies fever or chills. Above dressing change demonstrated for mother; will advance to weekly dressing changes. Provided Jon Martin with 3 weekly dressing kits for home use.   Device: N/A  BP & Labs:  Doppler BP 80 - Doppler is reflecting MAP  Hgb 10.9 - No S/S of bleeding. Specifically denies melena/BRBPR or nosebleeds.  LDH stable at 208 with established baseline of 250 - 430. Denies tea-colored urine. No power elevations noted on interrogation.   Jon Martin Instructions: 1. Decrease furosemide to Monday and Friday only. May take extra dose per week if  lower leg swelling or cough returns. 2. Continue Metoprolol 12.5 mg daily as you have been taking.  3. PharmD will call with INR results and warfarin dosing. 4. Return to [place] clinic in one week.  5. Advance to weekly dressing changes.   Zada Girt, RN VAD Coordinator   Office:  [phone] 24/7 Emergency VAD Pager: [phone]

## 2019-03-29 NOTE — Progress Notes (Signed)
CSW met with patient and mother in the clinic. Patient reports he received a new medicaid card and provided for CSW to have entered in the EMR. Patient states he is doing well although did admit to some issues with body image. CSW provided supportive intervention and discussed possibly connecting via phone with VAD patient Nate who he met prior to surgery. Patient very interested in speaking with Nate and getting some advice about living life with an LVAD. Patient shared he has been home mostly and has not ventured out yet due to the weather but hopeful with the warmer wether that he will be able to get out. Mom in good spirits and asked for some assistance with directions as she has a hard time navigating to the clinic. CSW provided directions to make it easier for her as she does not have a GPS. Patient and mother grateful for the support and will follow up with CSW as needed. CSW continues to be available as needed. [name], [place], [place]

## 2019-03-29 NOTE — Progress Notes (Signed)
LVAD INR 

## 2019-04-04 NOTE — Progress Notes (Signed)
VAD CLINIC NOTE  HPI:  [name] is a  34 y/o male from [place]  w/ h/o severe systolic HF due to NICM EF 15% and prior polysubstance abuse (cocaine and ETOH)  Underwent HM-3 LVAD placement on 02/26/19 by Dr. Morton Peters. Extubated 1/25. Required milrinone post op for RV support. Milrinone eventually weaned and Co-ox remained stable of inotropes at 61%. Later placed on sildenafil for continued RV support. Post op recovery complicated by hypoxic respiratory failure, moderate sized rt pneumothorax requiring CT placement.  Here with his mother for first post-implant visit. Feeling much better. Increasingly active. HHPT has discharged him. Appetite improved and has gianed about 10 pounds. Cough improved. Denies orthopnea or PND. No fevers, chills or problems with driveline. No bleeding, melena or neuro symptoms. No VAD alarms. Taking all meds as prescribed.   VAD Indication: Destination Therapy LVAD due to   LVAD assessment: HM III: Speed:  5500 rpms Flow: 4.1 Power: 4.2 w PI: 3.4 Alarms: no external power 2/21 (loss of power) Events: > 100 PI events 03/28/19  Fixed speed: 5500 Low speed limit: 5200  Primary Controller: Replace back up battery in26 months Back up controller: Replace back up battery in 22months     Past Medical History:  Diagnosis Date  . Dilated cardiomyopathy (HCC)   . Dilated cardiomyopathy (HCC) 02/2019  . Polysubstance abuse (HCC)     Current Outpatient Medications  Medication Sig Dispense Refill  . aspirin 81 MG EC tablet Take 1 tablet (81 mg total) by mouth daily. 30 tablet 5  . carvedilol (COREG) 3.125 MG tablet Take 1 tablet (3.125 mg total) by mouth 2 (two) times daily with a meal. 60 tablet 5  . digoxin (LANOXIN) 0.125 MG tablet Take 1 tablet (0.125 mg total) by mouth daily. 30 tablet 5  . furosemide (LASIX) 80 MG tablet Take one tablet on Monday and Friday; may take extra tab per week if lower leg swelling or cough develops 30 tablet 5    . pantoprazole (PROTONIX) 40 MG tablet Take 1 tablet (40 mg total) by mouth daily. 30 tablet 5  . sildenafil (REVATIO) 20 MG tablet Take 1 tablet (20 mg total) by mouth 3 (three) times daily. 90 tablet 5  . spironolactone (ALDACTONE) 25 MG tablet Take 1 tablet (25 mg total) by mouth daily. 30 tablet 5  . metoprolol tartrate (LOPRESSOR) 25 MG tablet Take 0.5 tablets (12.5 mg total) by mouth daily. 90 tablet 3  . warfarin (COUMADIN) 5 MG tablet Take 2.5 mg (1/2 tablet) every Monday and 5 mg (1 tablet) all other days. 30 tablet 5   No current facility-administered medications for this encounter.    Patient has no known allergies.   Vitals:   03/29/19 1428 03/29/19 1429  BP: (!) 80/0 (!) 97/44  Pulse:  92  Temp:  98.2 F (36.8 C)  SpO2:  92%  Weight:  55.6 kg (122 lb 9.6 oz)  Height:  5\' 8"  (1.727 m)   Vital Signs:  Temp: 98.2 Doppler Pressure:80 Automatc BP: 97/44 (73) HR: 92 SPO2: 92%  Weight: 122.6  lb w/o eqt Hospital d/c wt: 105 lbs   Physical Exam: General:  Thin male. NAD.  HEENT: normal  Neck: supple. JVP not elevated.  Carotids 2+ bilat; no bruits. No lymphadenopathy or thryomegaly appreciated. Cor: LVAD hum.  Lungs: Clear. Abdomen: soft, nontender, non-distended. No hepatosplenomegaly. No bruits or masses. Good bowel sounds. Driveline site clean. Anchor in place.  Extremities: no cyanosis, clubbing, rash. Warm  no edema  Neuro: alert & oriented x 3. No focal deficits. Moves all 4 without problem     ASSESSMENT AND PLAN:   1. Chronic systolic HF due to severe NICM - Echo EF 10% with biventricular failure - HM-3 VAD implant 02/26/19 - NYHA II - Volume status on low end. Decrease lasix to 80mg  M/F - Continue to build exercise tolerance. Refer CR - Continue sildeanfil for RV support - Continue digoxin, spiro amd carvedilol (not on metoprolol)   2. VAD - VAD interrogated personally. Parameters stable.Having some low flows due to volume depletion. Cut back  lasix as above - DL site looks good.  - INR 3.0 Discussed dosing with PharmD personally. - LDH 208  - MAPs ok   3. Polysubstance abuse - now abstinent  4. Post op-PNA and PTX - resolved  Total time spent 40 minutes. Over half that time spent discussing above.   Glori Bickers, MD  6:56 PM

## 2019-04-04 NOTE — Addendum Note (Signed)
Encounter addended by: Dolores Patty, MD on: 04/04/2019 6:58 PM  Actions taken: Clinical Note Signed, Level of Service modified, Visit diagnoses modified

## 2019-04-05 ENCOUNTER — Other Ambulatory Visit (HOSPITAL_COMMUNITY): Payer: Self-pay | Admitting: *Deleted

## 2019-04-05 DIAGNOSIS — Z95811 Presence of heart assist device: Secondary | ICD-10-CM

## 2019-04-05 DIAGNOSIS — Z7901 Long term (current) use of anticoagulants: Secondary | ICD-10-CM

## 2019-04-08 ENCOUNTER — Ambulatory Visit (HOSPITAL_COMMUNITY): Payer: Self-pay | Admitting: Pharmacist

## 2019-04-08 ENCOUNTER — Inpatient Hospital Stay (HOSPITAL_COMMUNITY): Payer: Medicaid - Out of State | Admitting: Internal Medicine

## 2019-04-08 ENCOUNTER — Ambulatory Visit (HOSPITAL_COMMUNITY)
Admission: RE | Admit: 2019-04-08 | Discharge: 2019-04-08 | Disposition: A | Discharge status: Home / Self Care | Payer: Medicaid - Out of State | Source: Ambulatory Visit | Attending: Internal Medicine | Admitting: Internal Medicine

## 2019-04-08 ENCOUNTER — Other Ambulatory Visit: Payer: Self-pay

## 2019-04-08 VITALS — BP 98/44 | HR 93 | Wt 125.2 lb

## 2019-04-08 DIAGNOSIS — F1410 Cocaine abuse, uncomplicated: Secondary | ICD-10-CM | POA: Insufficient documentation

## 2019-04-08 DIAGNOSIS — I5022 Chronic systolic (congestive) heart failure: Secondary | ICD-10-CM | POA: Diagnosis present

## 2019-04-08 DIAGNOSIS — I5082 Biventricular heart failure: Secondary | ICD-10-CM | POA: Diagnosis not present

## 2019-04-08 DIAGNOSIS — I5043 Acute on chronic combined systolic (congestive) and diastolic (congestive) heart failure: Secondary | ICD-10-CM | POA: Diagnosis not present

## 2019-04-08 DIAGNOSIS — Z7901 Long term (current) use of anticoagulants: Secondary | ICD-10-CM

## 2019-04-08 DIAGNOSIS — F1010 Alcohol abuse, uncomplicated: Secondary | ICD-10-CM | POA: Diagnosis not present

## 2019-04-08 DIAGNOSIS — Z79899 Other long term (current) drug therapy: Secondary | ICD-10-CM | POA: Insufficient documentation

## 2019-04-08 DIAGNOSIS — Z95811 Presence of heart assist device: Secondary | ICD-10-CM

## 2019-04-08 DIAGNOSIS — I420 Dilated cardiomyopathy: Secondary | ICD-10-CM | POA: Insufficient documentation

## 2019-04-08 DIAGNOSIS — F1920 Other psychoactive substance dependence, uncomplicated: Secondary | ICD-10-CM | POA: Diagnosis not present

## 2019-04-08 DIAGNOSIS — Z7982 Long term (current) use of aspirin: Secondary | ICD-10-CM | POA: Diagnosis not present

## 2019-04-08 LAB — CBC
HCT: 33.3 % — ABNORMAL LOW (ref 39.0–52.0)
Hemoglobin: 10.4 g/dL — ABNORMAL LOW (ref 13.0–17.0)
MCH: 29.9 pg (ref 26.0–34.0)
MCHC: 31.2 g/dL (ref 30.0–36.0)
MCV: 95.7 fL (ref 80.0–100.0)
Platelets: 278 10*3/uL (ref 150–400)
RBC: 3.48 MIL/uL — ABNORMAL LOW (ref 4.22–5.81)
RDW: 18.6 % — ABNORMAL HIGH (ref 11.5–15.5)
WBC: 6.6 10*3/uL (ref 4.0–10.5)
nRBC: 0 % (ref 0.0–0.2)

## 2019-04-08 LAB — BASIC METABOLIC PANEL
Anion gap: 11 (ref 5–15)
BUN: 8 mg/dL (ref 6–20)
CO2: 22 mmol/L (ref 22–32)
Calcium: 9.3 mg/dL (ref 8.9–10.3)
Chloride: 105 mmol/L (ref 98–111)
Creatinine, Ser: 0.64 mg/dL (ref 0.61–1.24)
GFR calc Af Amer: >60 mL/min (ref >60)
GFR calc non Af Amer: >60 mL/min (ref >60)
Glucose, Bld: 106 mg/dL — ABNORMAL HIGH (ref 70–99)
Potassium: 4.3 mmol/L (ref 3.5–5.1)
Sodium: 138 mmol/L (ref 135–145)

## 2019-04-08 LAB — PROTIME-INR
INR: 2.8 — ABNORMAL HIGH (ref 0.8–1.2)
Prothrombin Time: 29.7 seconds — ABNORMAL HIGH (ref 11.4–15.2)

## 2019-04-08 LAB — DIGOXIN LEVEL: Digoxin Level: 0.4 ng/mL — ABNORMAL LOW (ref 0.8–2.0)

## 2019-04-08 LAB — LACTATE DEHYDROGENASE: LDH: 208 U/L — ABNORMAL HIGH (ref 98–192)

## 2019-04-08 MED ORDER — METOPROLOL TARTRATE 25 MG PO TABS: 25.0000 mg | ORAL_TABLET | Freq: Every day | ORAL | 3 refills | Status: DC

## 2019-04-08 MED ORDER — WARFARIN SODIUM 5 MG PO TABS: ORAL_TABLET | ORAL | 5 refills | Status: DC

## 2019-04-08 NOTE — Patient Instructions (Addendum)
1. Continue Metoprolol 25mg  (1 tablet) daily 2. Coumadin dosing per PharmD. Take 2.5 mg (1/2 tablet) Coumadin tonight. Then continue 5 mg (1 tablet) daily 3. Return to VAD clinic in 2 weeks

## 2019-04-08 NOTE — Progress Notes (Signed)
LVAD INR 

## 2019-04-08 NOTE — Progress Notes (Signed)
Patient presents for 1 week follow up  in VAD Clinic today with mother. Reports no problems with VAD equipment or concerns with drive line.  Mother brought home medications. She reports he never got gabapentin filled. Pt denies pump pocket. Reports he has "sometimes when I cough". Cough much improved, none noted during visit today.   Pt's mother reports he has been taking Metoprolol 25 mg daily. He is ordered 12.5 mg. Will continue 25 mg per Dr Gala Romney.    Pt says he has gained weight at home due to increased appetite and eating more.   PI events much improved with change in Lasix last visit. No 5-25 events per day. Will continue Lasix on Monday and Friday.   Vital Signs:  Temp:  Doppler Pressure: 94 Automatc BP: 98/44 (63) HR: 93 SPO2: 96%  Weight: 125.2  lb w/o eqt Last weight: 122.6lb   VAD Indication: Destination Therapy LVAD due to   LVAD assessment: HM III: Speed:  5500 rpms Flow: 4.6 Power: 4.1 w    PI: 2.7 Alarms: none Events: 5-25  Fixed speed: 5500 Low speed limit: 5200  Primary Controller: Replace back up battery in 25 months Back up controller: Replace back up battery in 39months   I reviewed the LVAD parameters from today and compared the results to the patient's prior recorded data. LVAD interrogation was NEGATIVE for significant power changes, NEGATIVE for clinical alarms and STABLE for PI events/speed drops. No programming changes were made and pump is functioning within specified parameters. Pt is performing daily controller and system monitor self tests along with completing weekly and monthly maintenance for LVAD equipment.  LVAD equipment check completed and is in good working order. Back-up equipment present.   Annual Equipment Maintenance on UBC/PM was performed 2/21.  Exit Site Care: VAD dressing maintained by mother [name] weekly. Last dressing change was on Monday. Existing VAD dressing removed and site care performed using sterile  technique. Drive line exit site cleaned with Chlora prep applicators x 2, allowed to dry, and Sorbaview dressing with bio patch re-applied. Exit site well healed and incorporated.The velour is fully implanted at exit site. No redness, tenderness, drainage, or foul odor noted. Drive line anchor intact. Pt denies fever or chills. Continue weekly dressing changes. Provided patient with 8 weekly dressing kits for home use.     Device: N/A  BP & Labs:  Doppler BP 94 - Doppler is reflecting MAP  Hgb 10.4 - No S/S of bleeding. Specifically denies melena/BRBPR or nosebleeds.  LDH stable at 208 with established baseline of 250 - 430. Denies tea-colored urine. No power elevations noted on interrogation.   Patient Instructions: 1. Continue Metoprolol 25mg  (1 tablet) daily 2. Coumadin dosing per PharmD. Take 2.5 mg (1/2 tablet) Coumadin tonight. Then continue 5 mg (1 tablet) daily 3. Return to VAD clinic in 2 weeks    Leotis Shames RN VAD Coordinator  Office: [phone]  24/7 Pager: [phone]

## 2019-04-11 NOTE — Progress Notes (Signed)
VAD CLINIC NOTE  HPI:  [name] is a  34 y/o male from [place]  w/ h/o severe systolic HF due to NICM EF 15% and prior polysubstance abuse (cocaine and ETOH)  Underwent HM-3 LVAD placement on 02/26/19 by Dr. Morton Peters. Extubated 1/25. Required milrinone post op for RV support. Milrinone eventually weaned and Co-ox remained stable of inotropes at 61%. Later placed on sildenafil for continued RV support. Post op recovery complicated by hypoxic respiratory failure, moderate sized rt pneumothorax requiring CT placement.  Here with his mother for f/u VAD visit. He is doing really. Well breathing much better. Cough improved. Pocket pain resolved. Appetite good. Eating well. Denies orthopnea or PND. No fevers, chills or problems with driveline. No bleeding, melena or neuro symptoms. No VAD alarms. Taking all meds as prescribed.   PI events much improved with decrease in Lasix at last visit. No 5-25 events per day.   VAD Indication: Destination Therapy LVAD due to   LVAD assessment: HM III: Speed:  5500 rpms Flow: 4.6 Power: 4.1 w PI: 2.7 Alarms: none Events: 5-25  Fixed speed: 5500 Low speed limit: 5200  Past Medical History:  Diagnosis Date  . Dilated cardiomyopathy (HCC)   . Dilated cardiomyopathy (HCC) 02/2019  . Polysubstance abuse (HCC)     Current Outpatient Medications  Medication Sig Dispense Refill  . aspirin 81 MG EC tablet Take 1 tablet (81 mg total) by mouth daily. 30 tablet 5  . carvedilol (COREG) 3.125 MG tablet Take 1 tablet (3.125 mg total) by mouth 2 (two) times daily with a meal. 60 tablet 5  . digoxin (LANOXIN) 0.125 MG tablet Take 1 tablet (0.125 mg total) by mouth daily. 30 tablet 5  . furosemide (LASIX) 80 MG tablet Take one tablet on Monday and Friday; may take extra tab per week if lower leg swelling or cough develops 30 tablet 5  . metoprolol tartrate (LOPRESSOR) 25 MG tablet Take 1 tablet (25 mg total) by mouth daily. 90 tablet 3  . pantoprazole  (PROTONIX) 40 MG tablet Take 1 tablet (40 mg total) by mouth daily. 30 tablet 5  . sildenafil (REVATIO) 20 MG tablet Take 1 tablet (20 mg total) by mouth 3 (three) times daily. 90 tablet 5  . spironolactone (ALDACTONE) 25 MG tablet Take 1 tablet (25 mg total) by mouth daily. 30 tablet 5  . warfarin (COUMADIN) 5 MG tablet Take 5 mg (1 tablet) daily or as directed by HF Clinic 30 tablet 5   No current facility-administered medications for this encounter.    Patient has no known allergies.   Vitals:   04/08/19 1147 04/08/19 1150  BP: (!) 94/0 (!) 98/44  Pulse: 93   SpO2: 96%   Weight: 56.8 kg (125 lb 3.2 oz)    Vital Signs:  Temp:  Doppler Pressure:94 Automatc BP: 98/44 (63) HR: 93 SPO2: 96%  Weight: 125.2  lb w/o eqt Last weight: 122.6lb   Physical Exam: General:  NAD.  HEENT: normal  Neck: supple. JVP not elevated.  Carotids 2+ bilat; no bruits. No lymphadenopathy or thryomegaly appreciated. Cor: LVAD hum.  Lungs: Clear. Abdomen: soft, nontender, non-distended. No hepatosplenomegaly. No bruits or masses. Good bowel sounds. Driveline site clean. Anchor in place.  Extremities: no cyanosis, clubbing, rash. Warm no edema  Neuro: alert & oriented x 3. No focal deficits. Moves all 4 without problem   ASSESSMENT AND PLAN:   1. Chronic systolic HF due to severe NICM - Echo EF 10% with  biventricular failure - HM-3 VAD implant 02/26/19 - Continues to improved with VAD support . NYHA I-II - Volume status improved now (was low at last visit). Continue lasix 80mg  M/F. Likely can cut back or stop soon. May be able to add SGLT2i to control volume status - Refer CR - Continue sildeanfil for RV support - Continue digoxin, spiro and metoprolol (can switch to carvedilol at future visit)   2. VAD - VAD interrogated personally. Parameters stable. PI events decreased with decreased lasix dosing. May need to cut back farther, - DL site incorporating well  - INR 2.8 Discussed dosing with  PharmD personally. - LDH 208 - MAPs ok - Hgb 10.4 MCV 96  3. Polysubstance abuse - now abstinent  4. Post op-PNA and PTX - resolved  Total time spent 35 minutes. Over half that time spent discussing above.   Glori Bickers, MD  9:26 PM

## 2019-04-16 ENCOUNTER — Other Ambulatory Visit (HOSPITAL_COMMUNITY): Payer: Self-pay | Admitting: *Deleted

## 2019-04-16 DIAGNOSIS — Z79899 Other long term (current) drug therapy: Secondary | ICD-10-CM

## 2019-04-16 DIAGNOSIS — Z95811 Presence of heart assist device: Secondary | ICD-10-CM

## 2019-04-16 DIAGNOSIS — Z7901 Long term (current) use of anticoagulants: Secondary | ICD-10-CM

## 2019-04-16 DIAGNOSIS — Z5181 Encounter for therapeutic drug level monitoring: Secondary | ICD-10-CM

## 2019-04-19 ENCOUNTER — Ambulatory Visit (HOSPITAL_COMMUNITY)
Admission: RE | Admit: 2019-04-19 | Discharge: 2019-04-19 | Disposition: A | Discharge status: Home / Self Care | Payer: Medicaid - Out of State | Source: Ambulatory Visit | Attending: Internal Medicine | Admitting: Internal Medicine

## 2019-04-19 ENCOUNTER — Ambulatory Visit (HOSPITAL_COMMUNITY): Payer: Self-pay | Admitting: Pharmacist

## 2019-04-19 ENCOUNTER — Other Ambulatory Visit: Payer: Self-pay

## 2019-04-19 ENCOUNTER — Encounter (HOSPITAL_COMMUNITY): Payer: Self-pay

## 2019-04-19 VITALS — BP 92/82 | HR 89 | Wt 127.8 lb

## 2019-04-19 DIAGNOSIS — Z95811 Presence of heart assist device: Secondary | ICD-10-CM | POA: Insufficient documentation

## 2019-04-19 DIAGNOSIS — I428 Other cardiomyopathies: Secondary | ICD-10-CM | POA: Insufficient documentation

## 2019-04-19 DIAGNOSIS — F1010 Alcohol abuse, uncomplicated: Secondary | ICD-10-CM | POA: Diagnosis not present

## 2019-04-19 DIAGNOSIS — Z7901 Long term (current) use of anticoagulants: Secondary | ICD-10-CM | POA: Insufficient documentation

## 2019-04-19 DIAGNOSIS — I5082 Biventricular heart failure: Secondary | ICD-10-CM | POA: Diagnosis not present

## 2019-04-19 DIAGNOSIS — Z79899 Other long term (current) drug therapy: Secondary | ICD-10-CM | POA: Diagnosis not present

## 2019-04-19 DIAGNOSIS — Z5181 Encounter for therapeutic drug level monitoring: Secondary | ICD-10-CM

## 2019-04-19 DIAGNOSIS — F1920 Other psychoactive substance dependence, uncomplicated: Secondary | ICD-10-CM | POA: Diagnosis not present

## 2019-04-19 DIAGNOSIS — Z7982 Long term (current) use of aspirin: Secondary | ICD-10-CM | POA: Insufficient documentation

## 2019-04-19 DIAGNOSIS — F1410 Cocaine abuse, uncomplicated: Secondary | ICD-10-CM | POA: Diagnosis not present

## 2019-04-19 DIAGNOSIS — I5022 Chronic systolic (congestive) heart failure: Secondary | ICD-10-CM

## 2019-04-19 LAB — BASIC METABOLIC PANEL
Anion gap: 11 (ref 5–15)
BUN: 8 mg/dL (ref 6–20)
CO2: 25 mmol/L (ref 22–32)
Calcium: 9.5 mg/dL (ref 8.9–10.3)
Chloride: 101 mmol/L (ref 98–111)
Creatinine, Ser: 0.72 mg/dL (ref 0.61–1.24)
GFR calc Af Amer: >60 mL/min (ref >60)
GFR calc non Af Amer: >60 mL/min (ref >60)
Glucose, Bld: 102 mg/dL — ABNORMAL HIGH (ref 70–99)
Potassium: 3.9 mmol/L (ref 3.5–5.1)
Sodium: 137 mmol/L (ref 135–145)

## 2019-04-19 LAB — CBC
HCT: 37 % — ABNORMAL LOW (ref 39.0–52.0)
Hemoglobin: 11.8 g/dL — ABNORMAL LOW (ref 13.0–17.0)
MCH: 30.6 pg (ref 26.0–34.0)
MCHC: 31.9 g/dL (ref 30.0–36.0)
MCV: 96.1 fL (ref 80.0–100.0)
Platelets: 224 10*3/uL (ref 150–400)
RBC: 3.85 MIL/uL — ABNORMAL LOW (ref 4.22–5.81)
RDW: 18.7 % — ABNORMAL HIGH (ref 11.5–15.5)
WBC: 4.4 10*3/uL (ref 4.0–10.5)
nRBC: 0 % (ref 0.0–0.2)

## 2019-04-19 LAB — DIGOXIN LEVEL: Digoxin Level: 0.4 ng/mL — ABNORMAL LOW (ref 0.8–2.0)

## 2019-04-19 LAB — LACTATE DEHYDROGENASE: LDH: 238 U/L — ABNORMAL HIGH (ref 98–192)

## 2019-04-19 LAB — PROTIME-INR
INR: 2.2 — ABNORMAL HIGH (ref 0.8–1.2)
Prothrombin Time: 24.5 seconds — ABNORMAL HIGH (ref 11.4–15.2)

## 2019-04-19 NOTE — Progress Notes (Signed)
VAD CLINIC NOTE  HPI:  [name] is a  34 y/o male from [place]  w/ h/o severe systolic HF due to NICM EF 15% and prior polysubstance abuse (cocaine and ETOH)  Underwent HM-3 LVAD placement on 02/26/19 by Dr. Morton Peters. Extubated 1/25. Required milrinone post op for RV support. Milrinone eventually weaned and Co-ox remained stable of inotropes at 61%. Later placed on sildenafil for continued RV support. Post op recovery complicated by hypoxic respiratory failure, moderate sized rt pneumothorax requiring CT placement.  Here with his mother for f/u VAD visit. Last visit lasix cut back to 80mg  on M/F only. He is doing really. Feels very good. Getting around without SOB. Stamina improving. No dizziness.. Denies orthopnea or PND. No fevers, chills or problems with driveline. No bleeding, melena or neuro symptoms. No VAD alarms. Taking all meds as prescribed. Asking if it is ok to drink beer once in awhile.   VAD Indication: Destination Therapy LVAD due to ETOH/tobacco  LVAD assessment: HM III: Speed:  5500 rpms Flow: 4.0 Power: 4.1w PI:  4.9 Alarms: one low voltage alarm - pt admits to letting batteries get "too low" Events: 25 - 30 PI events  Fixed speed: 5500 Low speed limit: 5200  Primary Controller: Replace back up battery in25 months Back up controller: Replace back up battery in 30months   Past Medical History:  Diagnosis Date  . Dilated cardiomyopathy (HCC)   . Dilated cardiomyopathy (HCC) 02/2019  . Polysubstance abuse (HCC)     Current Outpatient Medications  Medication Sig Dispense Refill  . aspirin 81 MG EC tablet Take 1 tablet (81 mg total) by mouth daily. 30 tablet 5  . carvedilol (COREG) 3.125 MG tablet Take 1 tablet (3.125 mg total) by mouth 2 (two) times daily with a meal. 60 tablet 5  . digoxin (LANOXIN) 0.125 MG tablet Take 1 tablet (0.125 mg total) by mouth daily. 30 tablet 5  . furosemide (LASIX) 80 MG tablet Take one tablet on Monday and Friday;  may take extra tab per week if lower leg swelling or cough develops 30 tablet 5  . metoprolol tartrate (LOPRESSOR) 25 MG tablet Take 1 tablet (25 mg total) by mouth daily. 90 tablet 3  . pantoprazole (PROTONIX) 40 MG tablet Take 1 tablet (40 mg total) by mouth daily. 30 tablet 5  . sildenafil (REVATIO) 20 MG tablet Take 1 tablet (20 mg total) by mouth 3 (three) times daily. 90 tablet 5  . spironolactone (ALDACTONE) 25 MG tablet Take 1 tablet (25 mg total) by mouth daily. 30 tablet 5  . warfarin (COUMADIN) 5 MG tablet Take 5 mg (1 tablet) daily or as directed by [facility] Clinic 30 tablet 5   No current facility-administered medications for this encounter.    Patient has no known allergies.   Vitals:   04/19/19 1226 04/19/19 1227  BP: (!) 80/0 92/82  Pulse:  89  SpO2:  99%  Weight:  58 kg (127 lb 12.8 oz)   Vital Signs:  Temp:   Doppler Pressure:80 Automatc BP:  92/82 (84) HR: 89 SPO2: 99%  Weight: 127.8 lb w/o eqt Last weight: 125.2 lb    Physical Exam: General:  NAD.  HEENT: normal  Neck: supple. JVP not elevated.  Carotids 2+ bilat; no bruits. No lymphadenopathy or thryomegaly appreciated. Cor: LVAD hum.  Lungs: Clear. Abdomen: soft, nontender, non-distended. No hepatosplenomegaly. No bruits or masses. Good bowel sounds. Driveline site clean. Anchor in place.  Extremities: no cyanosis, clubbing, rash.  Warm no edema  Neuro: alert & oriented x 3. No focal deficits. Moves all 4 without problem    ASSESSMENT AND PLAN:   1. Chronic systolic HF due to severe NICM - Echo EF 10% with biventricular failure - HM-3 VAD implant 02/26/19 - Continues to improved with VAD support . NYHA I-II - Volume status looks good. Continue lasix 80mg  M/F. Likely can cut back or stop soon. May be able to add SGLT2i to control volume status at next visit - Has been referred to CR - Continue sildeanfil for RV support - Continue digoxin, spiro and carvedilol. Dig level 0.4 - Encouraged him to walk  30 mins each day - ok to break it up    2. VAD - VAD interrogated personally. Parameters stable. Still with frequent PI events but improved since cutting back diuretics - DL site continues to be incorporating well - INR 2.2. Discussed dosing with PharmD personally. - LDH 238 - MAPs looks good - Hgb 10.4 -> 11.8  MCV 96  3. Polysubstance abuse - now abstinent. - long discussion about not resuming beer intake. Ok to drink non-alcoholic beer if he wants   Total time spent 35 minutes. Over half that time spent discussing above.   Glori Bickers, MD  10:19 PM

## 2019-04-19 NOTE — Progress Notes (Signed)
LVAD INR 

## 2019-04-19 NOTE — Patient Instructions (Addendum)
1. No change in meds. 2. Check lab in two weeks at [facility] 3. Return to VAD clinic in one month.

## 2019-04-19 NOTE — Progress Notes (Addendum)
Patient presents for 2 week follow up  in VAD Clinic today with mother. Reports no problems with VAD equipment or concerns with drive line.  Pt reports he has increased stamina. He is walking more and denies any pain.   He denies any dizziness, lightheadedness, syncope, SOB, pedal edema, abdominal swelling, orthopnea, PND, or palpitations.    Vital Signs:  Temp:   Doppler Pressure: 80 Automatc BP:  92/82 (84) HR: 89 SPO2: 99%  Weight: 127.8 lb w/o eqt Last weight: 125.2 lb   VAD Indication: Destination Therapy LVAD due to   LVAD assessment: HM III: Speed:  5500 rpms Flow: 4.0 Power: 4.1w    PI:  4.9 Alarms: one low voltage alarm - pt admits to letting batteries get "too low" Events: 25 - 30 PI events  Fixed speed: 5500 Low speed limit: 5200  Primary Controller: Replace back up battery in 25 months Back up controller: Replace back up battery in 32months   I reviewed the LVAD parameters from today and compared the results to the patient's prior recorded data. LVAD interrogation was NEGATIVE for significant power changes, NEGATIVE for clinical alarms and STABLE for PI events/speed drops. No programming changes were made and pump is functioning within specified parameters. Pt is performing daily controller and system monitor self tests along with completing weekly and monthly maintenance for LVAD equipment.  LVAD equipment check completed and is in good working order. Back-up equipment present.   Annual Equipment Maintenance on UBC/PM was performed 2/21.  Exit Site Care: VAD dressing maintained by mother [name] weekly. Last dressing change was on Thursday of last week. Existing VAD dressing removed and site care performed using sterile technique. Drive line exit site cleaned with Chlora prep applicators x 2, allowed to dry, and Sorbaview dressing with bio patch re-applied. Exit site well healed and incorporated.The velour is fully implanted at exit site. No redness,  tenderness, drainage, or foul odor noted. Drive line anchor intact. Pt denies fever or chills. Continue weekly dressing changes. Provided patient with 7 weekly dressing kits for home use.   Pt may begin showering. Shower bag with illustration and directions provided: Here are some tips for washing:  . Avoid getting the driveline exit site dressing wet, and consider planning bathing times around exit site dressing changes. . Cover dressing with saran wrap or other plastic covering. Remove after shower and assess dressing for intact/dry state. . Wear shoulder bag cross body to prevent slipping of shoulder and falling to floor. . Drink plenty of fluid prior to getting in shower. . Have someone near by for first few showers to help if necessary.  . Use only the shower bag we gave you to shower with and don't get creative with your equipment to shower. Water and electricity do not mix and will cause your pump to stop.   . Anytime your dressing appears wet CHANGE IT! . Call VAD Clinic if DL site changes after beginning showers.   Device: N/A  BP & Labs:  Doppler BP 80 - Doppler is reflecting MAP  Hgb 11.8 - No S/S of bleeding. Specifically denies melena/BRBPR or nosebleeds.  LDH stable at 238 with established baseline of 250 - 430. Denies tea-colored urine. No power elevations noted on interrogation.   Patient Instructions: 1. No change in meds. 2. Check lab in two weeks at [facility] - standing order faxed to lab. 3. Return to VAD clinic in one month.   [name] RN VAD Coordinator  Office: [phone]  24/7 Pager: [phone]

## 2019-04-20 NOTE — Progress Notes (Signed)
CSW met in the clinic with patient and mother. Patient states he is doing well and beginning to get out and socialize with his friends again. Patient denies any tobacco use and was hesitant to answer about any alcohol intake. Later, after discussion with MD agreed that he would not drink any alcohol as he would like to pursue transplant someday and is motivated. Patient reports the weekly dressing changes are going well and denies any concerns with adjusting to VAD life. CSW discussed body image and if he would be interested in a  concealed VAD shirt. Patient reported that he is very interested and feels the concealed shirt would help keep his equipment safer. CSW will explore options and obtain a shirt for patient to trial. Patient's mom reports that she was able to make transport arrangements with the medicaid office and it has helped tremendously as the transport is covered by medicaid. Mom is relieved to ave the transport issues resolved. Patient and mom grateful for the support and deny any other concerns at this time. CSW continues to follow through VAD clinic. [name], [place], [place]

## 2019-04-20 NOTE — Addendum Note (Signed)
Encounter addended by: Levonne Spiller, RN on: 04/20/2019 12:23 PM  Actions taken: Clinical Note Signed

## 2019-04-20 NOTE — Addendum Note (Signed)
Encounter addended by: Marcy Siren, LCSW on: 04/20/2019 11:55 AM  Actions taken: Flowsheet accepted, Clinical Note Signed

## 2019-05-11 LAB — POCT INR: INR: 2.5 (ref 2.0–3.0)

## 2019-05-12 ENCOUNTER — Ambulatory Visit (HOSPITAL_COMMUNITY): Payer: Self-pay | Admitting: Pharmacist

## 2019-05-12 NOTE — Progress Notes (Signed)
LVAD INR 

## 2019-05-17 ENCOUNTER — Encounter (HOSPITAL_COMMUNITY): Payer: Medicaid - Out of State

## 2019-05-21 ENCOUNTER — Other Ambulatory Visit (HOSPITAL_COMMUNITY): Payer: Self-pay | Admitting: *Deleted

## 2019-05-21 DIAGNOSIS — Z7901 Long term (current) use of anticoagulants: Secondary | ICD-10-CM

## 2019-05-21 DIAGNOSIS — Z95811 Presence of heart assist device: Secondary | ICD-10-CM

## 2019-05-21 DIAGNOSIS — I5022 Chronic systolic (congestive) heart failure: Secondary | ICD-10-CM

## 2019-05-24 ENCOUNTER — Encounter (HOSPITAL_COMMUNITY): Payer: Medicaid - Out of State

## 2019-05-28 ENCOUNTER — Telehealth (HOSPITAL_COMMUNITY): Payer: Self-pay | Admitting: Licensed Clinical Social Worker

## 2019-05-28 NOTE — Progress Notes (Signed)
Patient called to share that medicaid transport can not bring him from [place] until after May 1st. Patient requesting to change his appointment until after May 1st. CSW will forward request to VAD Coordinator. Patient grateful for the support. Lasandra Beech, LCSW, CCSW-MCS [phone]

## 2019-06-01 ENCOUNTER — Encounter (HOSPITAL_COMMUNITY): Payer: Medicaid - Out of State

## 2019-06-04 ENCOUNTER — Other Ambulatory Visit (HOSPITAL_COMMUNITY): Payer: Self-pay | Admitting: Unknown Physician Specialty

## 2019-06-04 DIAGNOSIS — Z7901 Long term (current) use of anticoagulants: Secondary | ICD-10-CM

## 2019-06-04 DIAGNOSIS — Z95811 Presence of heart assist device: Secondary | ICD-10-CM

## 2019-06-07 ENCOUNTER — Encounter (HOSPITAL_COMMUNITY): Payer: Self-pay

## 2019-06-07 ENCOUNTER — Ambulatory Visit (HOSPITAL_COMMUNITY): Payer: Self-pay | Admitting: Pharmacist

## 2019-06-07 ENCOUNTER — Ambulatory Visit (HOSPITAL_COMMUNITY)
Admission: RE | Admit: 2019-06-07 | Discharge: 2019-06-07 | Disposition: A | Discharge status: Home / Self Care | Payer: Medicaid - Out of State | Source: Ambulatory Visit | Attending: Internal Medicine | Admitting: Internal Medicine

## 2019-06-07 ENCOUNTER — Other Ambulatory Visit: Payer: Self-pay

## 2019-06-07 VITALS — BP 118/68 | HR 95 | Wt 127.4 lb

## 2019-06-07 DIAGNOSIS — I428 Other cardiomyopathies: Secondary | ICD-10-CM | POA: Diagnosis not present

## 2019-06-07 DIAGNOSIS — Z79899 Other long term (current) drug therapy: Secondary | ICD-10-CM | POA: Insufficient documentation

## 2019-06-07 DIAGNOSIS — Z7901 Long term (current) use of anticoagulants: Secondary | ICD-10-CM | POA: Diagnosis not present

## 2019-06-07 DIAGNOSIS — Z95811 Presence of heart assist device: Secondary | ICD-10-CM | POA: Insufficient documentation

## 2019-06-07 DIAGNOSIS — I5022 Chronic systolic (congestive) heart failure: Secondary | ICD-10-CM | POA: Diagnosis not present

## 2019-06-07 DIAGNOSIS — F1010 Alcohol abuse, uncomplicated: Secondary | ICD-10-CM | POA: Diagnosis not present

## 2019-06-07 DIAGNOSIS — Z7982 Long term (current) use of aspirin: Secondary | ICD-10-CM | POA: Insufficient documentation

## 2019-06-07 DIAGNOSIS — F1910 Other psychoactive substance abuse, uncomplicated: Secondary | ICD-10-CM | POA: Diagnosis not present

## 2019-06-07 DIAGNOSIS — I5082 Biventricular heart failure: Secondary | ICD-10-CM | POA: Diagnosis not present

## 2019-06-07 LAB — CBC
HCT: 41.9 % (ref 39.0–52.0)
Hemoglobin: 14.5 g/dL (ref 13.0–17.0)
MCH: 31.3 pg (ref 26.0–34.0)
MCHC: 34.6 g/dL (ref 30.0–36.0)
MCV: 90.5 fL (ref 80.0–100.0)
Platelets: 142 10*3/uL — ABNORMAL LOW (ref 150–400)
RBC: 4.63 MIL/uL (ref 4.22–5.81)
RDW: 14.6 % (ref 11.5–15.5)
WBC: 4.2 10*3/uL (ref 4.0–10.5)
nRBC: 0 % (ref 0.0–0.2)

## 2019-06-07 LAB — COMPREHENSIVE METABOLIC PANEL
ALT: 36 U/L (ref 0–44)
AST: 98 U/L — ABNORMAL HIGH (ref 15–41)
Albumin: 4.2 g/dL (ref 3.5–5.0)
Alkaline Phosphatase: 83 U/L (ref 38–126)
Anion gap: 14 (ref 5–15)
BUN: 9 mg/dL (ref 6–20)
CO2: 21 mmol/L — ABNORMAL LOW (ref 22–32)
Calcium: 9.9 mg/dL (ref 8.9–10.3)
Chloride: 98 mmol/L (ref 98–111)
Creatinine, Ser: 0.93 mg/dL (ref 0.61–1.24)
GFR calc Af Amer: >60 mL/min (ref >60)
GFR calc non Af Amer: >60 mL/min (ref >60)
Glucose, Bld: 120 mg/dL — ABNORMAL HIGH (ref 70–99)
Potassium: 4 mmol/L (ref 3.5–5.1)
Sodium: 133 mmol/L — ABNORMAL LOW (ref 135–145)
Total Bilirubin: 1.2 mg/dL (ref 0.3–1.2)
Total Protein: 8.9 g/dL — ABNORMAL HIGH (ref 6.5–8.1)

## 2019-06-07 LAB — PROTIME-INR
INR: 2 — ABNORMAL HIGH (ref 0.8–1.2)
Prothrombin Time: 22.1 seconds — ABNORMAL HIGH (ref 11.4–15.2)

## 2019-06-07 LAB — LACTATE DEHYDROGENASE: LDH: 339 U/L — ABNORMAL HIGH (ref 98–192)

## 2019-06-07 MED ORDER — ASPIRIN 81 MG PO TBEC: 81.0000 mg | DELAYED_RELEASE_TABLET | Freq: Every day | ORAL | 12 refills | Status: DC

## 2019-06-07 MED ORDER — DIGOXIN 125 MCG PO TABS: 0.1250 mg | ORAL_TABLET | Freq: Every day | ORAL | 5 refills | Status: DC

## 2019-06-07 NOTE — Progress Notes (Signed)
VAD CLINIC NOTE  HPI:  [name] is a  34 y/o male from [place]  w/ h/o severe systolic HF due to NICM EF 15% and prior polysubstance abuse (cocaine and ETOH)  Underwent HM-3 LVAD placement on 02/26/19 by Dr. Nils Pyle. Extubated 1/25. Required milrinone post op for RV support. Milrinone eventually weaned and Co-ox remained stable of inotropes at 61%. Later placed on sildenafil for continued RV support. Post op recovery complicated by hypoxic respiratory failure, moderate sized rt pneumothorax requiring CT placement.  Here for routine VAD visit. Says he is doing really well. Now taking lasix 80mg  on M/F only. Doing all activities without problem. Denies orthopnea or PND. No fevers, chills or problems with driveline. No bleeding, melena or neuro symptoms. 2 low flow alarms as below. Taking all meds as prescribed.    VAD Indication: Destination Therapy LVAD due to   LVAD assessment: HM III: Speed:  5500 rpms Flow: 3.3 Power: 4.3w PI: 9.3 Alarms: none on interrogation; reports 2 low flows over the last month- once while "laughing really hard" and once "while I was angry"  Events: 30-60 PI events  Fixed speed: 5500 Low speed limit: 5200  Primary Controller: Replace back up battery in23 months Back up controller: Replace back up battery in 3months    Past Medical History:  Diagnosis Date  . Dilated cardiomyopathy ([place])   . Dilated cardiomyopathy ([place]) 02/2019  . Polysubstance abuse ([place])     Current Outpatient Medications  Medication Sig Dispense Refill  . carvedilol (COREG) 3.125 MG tablet Take 1 tablet (3.125 mg total) by mouth 2 (two) times daily with a meal. 60 tablet 5  . digoxin (LANOXIN) 0.125 MG tablet Take 1 tablet (0.125 mg total) by mouth daily. 30 tablet 5  . furosemide (LASIX) 80 MG tablet Take one tablet on Monday and Friday; may take extra tab per week if lower leg swelling or cough develops 30 tablet 5  . metoprolol tartrate (LOPRESSOR) 25 MG  tablet Take 1 tablet (25 mg total) by mouth daily. (Patient taking differently: Take 12.5 mg by mouth daily. ) 90 tablet 3  . pantoprazole (PROTONIX) 40 MG tablet Take 1 tablet (40 mg total) by mouth daily. 30 tablet 5  . sildenafil (REVATIO) 20 MG tablet Take 1 tablet (20 mg total) by mouth 3 (three) times daily. 90 tablet 5  . spironolactone (ALDACTONE) 25 MG tablet Take 1 tablet (25 mg total) by mouth daily. 30 tablet 5  . warfarin (COUMADIN) 5 MG tablet Take 5 mg (1 tablet) daily or as directed by HF Clinic 30 tablet 5  . aspirin 81 MG EC tablet Take 1 tablet (81 mg total) by mouth daily. Swallow whole. 30 tablet 12   No current facility-administered medications for this encounter.    Patient has no known allergies.   Vitals:   06/07/19 1053 06/07/19 1054  BP: (!) 110/0 118/68  Pulse: 95   SpO2: 100%   Weight: 57.8 kg (127 lb 6.4 oz)     Vital Signs:  Temp:   Doppler Pressure:110 Automatc BP: 118/68 (93) HR: 95 SPO2: 100%  Weight: 127.4 lb w/o eqt Last weight: 127.8 lb     Physical Exam: General:  NAD.  HEENT: normal  Neck: supple. JVP not elevated.  Carotids 2+ bilat; no bruits. No lymphadenopathy or thryomegaly appreciated. Cor: LVAD hum.  Lungs: Clear. Abdomen: soft, nontender, non-distended. No hepatosplenomegaly. No bruits or masses. Good bowel sounds. Driveline site  clean. Anchor in place.  Extremities: no cyanosis, clubbing, rash. Warm no edema  Neuro: alert & oriented x 3. No focal deficits. Moves all 4 without problem     ASSESSMENT AND PLAN:   1. Chronic systolic HF due to severe NICM - Echo EF 10% with biventricular failure - HM-3 VAD implant 02/26/19 - Doing very well. NYHA I - Volume status looks good. Continue lasix 80mg  M/F for now. Can consider cutting back or stopping soon. May be able to add SGLT2i to control volume status at next visit - Continue sildeanfil for RV support - Continue spiro and carvedilol. Stop digoxin - Continue to  increase activity   2. VAD - VAD interrogated personally. Parameters stable. See details above - DL looks good - INR 2.0. Discussed dosing with PharmD personally. - LDH 238 -> 339 Continue ASA on top of warfarin for now. Recheck next week.  - MAPs looks good - Hgb 14.5  3. Polysubstance abuse - now abstinent. - reinforced fact of not resuming beer intake. Ok to drink non-alcoholic beer if he wants   Total time spent 35 minutes. Over half that time spent discussing above.   , MD  1:32 PM

## 2019-06-07 NOTE — Progress Notes (Addendum)
Patient presents for 1 month follow up  in VAD Clinic today with mother. Reports no problems with VAD equipment or concerns with drive line.  Pt reports he has increased stamina. He is walking more and denies any pain.   He denies any dizziness, lightheadedness, syncope, SOB, pedal edema, abdominal swelling, orthopnea, PND, or palpitations.    Vital Signs:  Temp:   Doppler Pressure: 110 Automatc BP: 118/68 (93) HR: 95 SPO2: 100%  Weight: 127.4 lb w/o eqt Last weight: 127.8 lb   VAD Indication: Destination Therapy LVAD due to   LVAD assessment: HM III: Speed:  5500 rpms Flow: 3.3 Power: 4.3w    PI: 9.3 Alarms: none on interrogation; reports 2 low flows over the last month- once while "laughing really hard" and once "while I was angry"  Events: 30-60 PI events  Fixed speed: 5500 Low speed limit: 5200  Primary Controller: Replace back up battery in 23 months Back up controller: Replace back up battery in 41months   I reviewed the LVAD parameters from today and compared the results to the patient's prior recorded data. LVAD interrogation was NEGATIVE for significant power changes, NEGATIVE for clinical alarms and STABLE for PI events/speed drops. No programming changes were made and pump is functioning within specified parameters. Pt is performing daily controller and system monitor self tests along with completing weekly and monthly maintenance for LVAD equipment.  LVAD equipment check completed and is in good working order. Back-up equipment present.   Annual Equipment Maintenance on UBC/PM was performed 2/21.  Exit Site Care: VAD dressing maintained by mother [name] weekly on Mondays. Existing VAD dressing removed and site care performed using sterile technique. Drive line exit site cleaned with Chlora prep applicators x 2, allowed to dry, and Sorbaview dressing with bio patch re-applied. Exit site well healed and incorporated.The velour is fully implanted at exit  site. No redness, tenderness, drainage, or foul odor noted. Drive line anchor intact. Pt denies fever or chills. Continue weekly dressing changes. Provided patient with 8 weekly dressing kits for home use.   Device: N/A  BP & Labs:  Doppler BP 110  - Doppler is reflecting modified systolic  Hgb 14.5 - No S/S of bleeding. Specifically denies melena/BRBPR or nosebleeds.  LDH stable at 339 with established baseline of 250 - 430. Denies tea-colored urine. No power elevations noted on interrogation.    3 month Intermacs follow up completed including:  Quality of Life, KCCQ-12, and Neurocognitive trail making (completed in 1 minute 19 seconds)  Pt completed 1350 feet during 6 minute walk.  Back up controller:  11V backup battery charged during this visit.  Patient Instructions: 1. Initially we stopped ASA today, but with elevated LDH will continue ASA at this time. Spoke with patient and he verbalized understanding.  2. Make sure you are drinking 2 liters per day  3. Coumadin dosing per Posey Boyer D 4. Return to VAD clinic in 2 months  5. Recheck LDH with next INR draw. Orders faxed to [facility].   Alyce Pagan RN VAD Coordinator  Office: [phone]  24/7 Pager: [phone]

## 2019-06-07 NOTE — Addendum Note (Signed)
Encounter addended by: Bernita Raisin, RN on: 06/07/2019 2:44 PM  Actions taken: Clinical Note Signed

## 2019-06-07 NOTE — Patient Instructions (Addendum)
1. Stop Aspirin today 2. Make sure you are drinking 2 liters per day  3. Coumadin dosing per Posey Boyer D 4. Return to VAD clinic in 2 months

## 2019-06-07 NOTE — Progress Notes (Signed)
LVAD INR 

## 2019-06-23 ENCOUNTER — Ambulatory Visit (HOSPITAL_COMMUNITY): Payer: Self-pay | Admitting: Pharmacist

## 2019-06-23 LAB — POCT INR: INR: 3.7 — AB (ref 2.0–3.0)

## 2019-06-23 NOTE — Progress Notes (Signed)
LVAD INR 

## 2019-06-30 ENCOUNTER — Encounter (HOSPITAL_COMMUNITY): Payer: Self-pay | Admitting: Unknown Physician Specialty

## 2019-06-30 LAB — POCT INR: INR: 4.2 — AB (ref 2.0–3.0)

## 2019-07-01 ENCOUNTER — Ambulatory Visit (HOSPITAL_COMMUNITY): Payer: Self-pay | Admitting: Pharmacist

## 2019-07-01 MED ORDER — WARFARIN SODIUM 5 MG PO TABS: ORAL_TABLET | ORAL | 5 refills | Status: DC

## 2019-07-01 NOTE — Progress Notes (Signed)
LVAD INR 

## 2019-07-08 ENCOUNTER — Ambulatory Visit (HOSPITAL_COMMUNITY): Payer: Self-pay | Admitting: Pharmacist

## 2019-07-08 LAB — POCT INR: INR: 2.7 (ref 2.0–3.0)

## 2019-07-08 NOTE — Progress Notes (Signed)
LVAD INR 

## 2019-07-22 ENCOUNTER — Other Ambulatory Visit: Payer: Self-pay | Admitting: Internal Medicine

## 2019-07-24 LAB — PROTIME-INR
INR: 3.6 — ABNORMAL HIGH (ref 0.9–1.2)
Prothrombin Time: 36.2 s — ABNORMAL HIGH (ref 9.1–12.0)

## 2019-07-29 ENCOUNTER — Ambulatory Visit (HOSPITAL_COMMUNITY): Payer: Self-pay | Admitting: Pharmacist

## 2019-07-29 LAB — POCT INR: INR: 3.6 — AB (ref 2.0–3.0)

## 2019-07-29 MED ORDER — WARFARIN SODIUM 5 MG PO TABS: ORAL_TABLET | ORAL | 5 refills | Status: DC

## 2019-07-29 NOTE — Progress Notes (Signed)
LVAD INR 

## 2019-08-02 ENCOUNTER — Inpatient Hospital Stay (HOSPITAL_COMMUNITY)
Admission: AD | Admit: 2019-08-02 | Discharge: 2019-08-06 | DRG: 683 | Disposition: A | Discharge status: Home / Self Care | Payer: Medicaid - Out of State | Source: Other Acute Inpatient Hospital | Attending: Internal Medicine | Admitting: Internal Medicine

## 2019-08-02 DIAGNOSIS — A084 Viral intestinal infection, unspecified: Secondary | ICD-10-CM | POA: Diagnosis present

## 2019-08-02 DIAGNOSIS — Z79899 Other long term (current) drug therapy: Secondary | ICD-10-CM

## 2019-08-02 DIAGNOSIS — R1110 Vomiting, unspecified: Secondary | ICD-10-CM | POA: Diagnosis not present

## 2019-08-02 DIAGNOSIS — Z8249 Family history of ischemic heart disease and other diseases of the circulatory system: Secondary | ICD-10-CM

## 2019-08-02 DIAGNOSIS — I5022 Chronic systolic (congestive) heart failure: Secondary | ICD-10-CM | POA: Diagnosis present

## 2019-08-02 DIAGNOSIS — N170 Acute kidney failure with tubular necrosis: Secondary | ICD-10-CM | POA: Diagnosis present

## 2019-08-02 DIAGNOSIS — N179 Acute kidney failure, unspecified: Secondary | ICD-10-CM | POA: Diagnosis not present

## 2019-08-02 DIAGNOSIS — Z95811 Presence of heart assist device: Secondary | ICD-10-CM | POA: Diagnosis not present

## 2019-08-02 DIAGNOSIS — E860 Dehydration: Secondary | ICD-10-CM | POA: Diagnosis present

## 2019-08-02 DIAGNOSIS — I5082 Biventricular heart failure: Secondary | ICD-10-CM | POA: Diagnosis present

## 2019-08-02 DIAGNOSIS — R112 Nausea with vomiting, unspecified: Secondary | ICD-10-CM | POA: Diagnosis present

## 2019-08-02 DIAGNOSIS — I420 Dilated cardiomyopathy: Secondary | ICD-10-CM | POA: Diagnosis present

## 2019-08-02 DIAGNOSIS — Z7901 Long term (current) use of anticoagulants: Secondary | ICD-10-CM | POA: Diagnosis not present

## 2019-08-02 DIAGNOSIS — E876 Hypokalemia: Secondary | ICD-10-CM | POA: Diagnosis present

## 2019-08-02 DIAGNOSIS — Z7982 Long term (current) use of aspirin: Secondary | ICD-10-CM | POA: Diagnosis not present

## 2019-08-02 LAB — CBC WITH DIFFERENTIAL/PLATELET
Abs Immature Granulocytes: 0.03 10*3/uL (ref 0.00–0.07)
Basophils Absolute: 0 10*3/uL (ref 0.0–0.1)
Basophils Relative: 0 %
Eosinophils Absolute: 0 10*3/uL (ref 0.0–0.5)
Eosinophils Relative: 1 %
HCT: 32.3 % — ABNORMAL LOW (ref 39.0–52.0)
Hemoglobin: 10.8 g/dL — ABNORMAL LOW (ref 13.0–17.0)
Immature Granulocytes: 0 %
Lymphocytes Relative: 27 %
Lymphs Abs: 2.2 10*3/uL (ref 0.7–4.0)
MCH: 32.8 pg (ref 26.0–34.0)
MCHC: 33.4 g/dL (ref 30.0–36.0)
MCV: 98.2 fL (ref 80.0–100.0)
Monocytes Absolute: 1.3 10*3/uL — ABNORMAL HIGH (ref 0.1–1.0)
Monocytes Relative: 16 %
Neutro Abs: 4.6 10*3/uL (ref 1.7–7.7)
Neutrophils Relative %: 56 %
Platelets: 176 10*3/uL (ref 150–400)
RBC: 3.29 MIL/uL — ABNORMAL LOW (ref 4.22–5.81)
RDW: 16.1 % — ABNORMAL HIGH (ref 11.5–15.5)
WBC: 8.2 10*3/uL (ref 4.0–10.5)
nRBC: 0 % (ref 0.0–0.2)

## 2019-08-02 LAB — AMYLASE: Amylase: 105 U/L — ABNORMAL HIGH (ref 28–100)

## 2019-08-02 LAB — COMPREHENSIVE METABOLIC PANEL
ALT: 19 U/L (ref 0–44)
AST: 42 U/L — ABNORMAL HIGH (ref 15–41)
Albumin: 3.4 g/dL — ABNORMAL LOW (ref 3.5–5.0)
Alkaline Phosphatase: 59 U/L (ref 38–126)
Anion gap: 15 (ref 5–15)
BUN: 41 mg/dL — ABNORMAL HIGH (ref 6–20)
CO2: 28 mmol/L (ref 22–32)
Calcium: 8.1 mg/dL — ABNORMAL LOW (ref 8.9–10.3)
Chloride: 90 mmol/L — ABNORMAL LOW (ref 98–111)
Creatinine, Ser: 2.25 mg/dL — ABNORMAL HIGH (ref 0.61–1.24)
GFR calc Af Amer: 43 mL/min — ABNORMAL LOW (ref >60)
GFR calc non Af Amer: 37 mL/min — ABNORMAL LOW (ref >60)
Glucose, Bld: 104 mg/dL — ABNORMAL HIGH (ref 70–99)
Potassium: 2.9 mmol/L — ABNORMAL LOW (ref 3.5–5.1)
Sodium: 133 mmol/L — ABNORMAL LOW (ref 135–145)
Total Bilirubin: 2.2 mg/dL — ABNORMAL HIGH (ref 0.3–1.2)
Total Protein: 6.7 g/dL (ref 6.5–8.1)

## 2019-08-02 LAB — PROTIME-INR
INR: 1.3 — ABNORMAL HIGH (ref 0.8–1.2)
Prothrombin Time: 15.5 seconds — ABNORMAL HIGH (ref 11.4–15.2)

## 2019-08-02 LAB — LACTATE DEHYDROGENASE: LDH: 199 U/L — ABNORMAL HIGH (ref 98–192)

## 2019-08-02 LAB — LIPASE, BLOOD: Lipase: 28 U/L (ref 11–51)

## 2019-08-02 LAB — MAGNESIUM: Magnesium: 1.8 mg/dL (ref 1.7–2.4)

## 2019-08-02 MED ORDER — HEPARIN (PORCINE) 25000 UT/250ML-% IV SOLN
400.0000 [IU]/h | INTRAVENOUS | Status: AC
  Administered 2019-08-02 – 2019-08-05 (×2): 400 [IU]/h via INTRAVENOUS
  Filled 2019-08-02 (×2): qty 250

## 2019-08-02 MED ORDER — ONDANSETRON HCL 4 MG/2ML IJ SOLN: 4.0000 mg | Freq: Four times a day (QID) | INTRAMUSCULAR | Status: DC | PRN

## 2019-08-02 MED ORDER — DIGOXIN 125 MCG PO TABS
0.1250 mg | ORAL_TABLET | Freq: Every day | ORAL | Status: DC
  Administered 2019-08-03 – 2019-08-06 (×4): 0.125 mg via ORAL
  Filled 2019-08-02 (×5): qty 1

## 2019-08-02 MED ORDER — POTASSIUM CHLORIDE 10 MEQ/100ML IV SOLN
10.0000 meq | INTRAVENOUS | Status: AC
  Administered 2019-08-02 – 2019-08-03 (×5): 10 meq via INTRAVENOUS
  Filled 2019-08-02 (×5): qty 100

## 2019-08-02 MED ORDER — MAGNESIUM SULFATE 2 GM/50ML IV SOLN
2.0000 g | Freq: Once | INTRAVENOUS | Status: AC
  Administered 2019-08-02: 2 g via INTRAVENOUS
  Filled 2019-08-02: qty 50

## 2019-08-02 MED ORDER — WARFARIN - PHARMACIST DOSING INPATIENT: Freq: Every day | Status: DC

## 2019-08-02 MED ORDER — ACETAMINOPHEN 325 MG PO TABS: 650.0000 mg | ORAL_TABLET | ORAL | Status: DC | PRN

## 2019-08-02 MED ORDER — PANTOPRAZOLE SODIUM 40 MG PO TBEC
40.0000 mg | DELAYED_RELEASE_TABLET | Freq: Every day | ORAL | Status: DC
  Administered 2019-08-03 – 2019-08-06 (×4): 40 mg via ORAL
  Filled 2019-08-02 (×5): qty 1

## 2019-08-02 MED ORDER — POTASSIUM CHLORIDE CRYS ER 20 MEQ PO TBCR
60.0000 meq | EXTENDED_RELEASE_TABLET | Freq: Once | ORAL | Status: AC
  Administered 2019-08-02: 60 meq via ORAL
  Filled 2019-08-02: qty 3

## 2019-08-02 MED ORDER — WARFARIN SODIUM 5 MG PO TABS
5.0000 mg | ORAL_TABLET | Freq: Once | ORAL | Status: AC
  Administered 2019-08-02: 5 mg via ORAL
  Filled 2019-08-02: qty 1

## 2019-08-02 MED ORDER — SILDENAFIL CITRATE 20 MG PO TABS
20.0000 mg | ORAL_TABLET | Freq: Three times a day (TID) | ORAL | Status: DC
  Administered 2019-08-02 – 2019-08-06 (×11): 20 mg via ORAL
  Filled 2019-08-02 (×11): qty 1

## 2019-08-02 MED ORDER — ASPIRIN EC 81 MG PO TBEC
81.0000 mg | DELAYED_RELEASE_TABLET | Freq: Every day | ORAL | Status: DC
  Administered 2019-08-03 – 2019-08-06 (×4): 81 mg via ORAL
  Filled 2019-08-02 (×5): qty 1

## 2019-08-02 MED ORDER — CARVEDILOL 3.125 MG PO TABS
3.1250 mg | ORAL_TABLET | Freq: Two times a day (BID) | ORAL | Status: DC
  Administered 2019-08-03 – 2019-08-06 (×7): 3.125 mg via ORAL
  Filled 2019-08-02 (×7): qty 1

## 2019-08-02 NOTE — H&P (Signed)
Advanced Heart Failure VAD History and Physical Note   PCP-Cardiologist: Dr. Gala Romney   Reason for Admission: Nausea/ Vomiting w/ Subsequent Dehydration and Hypokalemia   HPI:      [name] is a  34 y/o male from [place]  w/ h/o severe systolic HF due to NICM EF 15% and prior polysubstance abuse (cocaine and ETOH)  Underwent HM-3 LVAD placement on 02/26/19 by Dr. Morton Peters. Extubated 1/25. Required milrinone post op for RV support. Milrinone eventually weaned and Co-ox remained stable of inotropes at 61%. Later placed on sildenafil for continued RV support. Post op recovery complicated by hypoxic respiratory failure, moderate sized rt pneumothorax requiring CT placement.  Presented to outside hospital today in [place] w/ 2-3 day h/o nausea, vomiting and weakness. Thinks he at some cantaloupe that was bad on Friday. In outside ED, labs suggest volume depletion and show hypokalemia w/ K 2.7 and cr 2.7. No VAD alarms. Now being transferred to [facility] for [facility] care. IVFs + IV K given in the ED in [place] prior to transfer here.    On arrival he looks pretty good. Holding down some dinner. Denies ab pain or diarrhea. No bleeding or hematemesis.   MAP 80  LVAD INTERROGATION:  HeartMate II LVAD:  Flow 4.1 liters/min, speed 5500, power 4.0, PI 4.4. Multiple PI events. No low flows.      Review of Systems: [y] = yes, [ ]  = no   General: Weight gain [ ] ; Weight loss [ ] ; Anorexia [ ] ; Fatigue [ ] ; Fever [ ] ; Chills [ ] ; Weakness [ ]   Cardiac: Chest pain/pressure [ ] ; Resting SOB [ ] ; Exertional SOB [ ] ; Orthopnea [ ] ; Pedal Edema [ ] ; Palpitations [ ] ; Syncope [ ] ; Presyncope [ ] ; Paroxysmal nocturnal dyspnea[ ]   Pulmonary: Cough [ ] ; Wheezing[ ] ; Hemoptysis[ ] ; Sputum [ ] ; Snoring [ ]   GI: Vomiting[y ]; Dysphagia[ ] ; Melena[ ] ; Hematochezia [ ] ; Heartburn[ ] ; Abdominal pain [ ] ; Constipation [ ] ; Diarrhea [ ] ; BRBPR [ ]   GU: Hematuria[ ] ; Dysuria [ ] ; Nocturia[ ]   Vascular: Pain in legs with  walking [ ] ; Pain in feet with lying flat [ ] ; Non-healing sores [ ] ; Stroke [ ] ; TIA [ ] ; Slurred speech [ ] ;  Neuro: Headaches[ ] ; Vertigo[ ] ; Seizures[ ] ; Paresthesias[ ] ;Blurred vision [ ] ; Diplopia [ ] ; Vision changes [ ]   Ortho/Skin: Arthritis [ ] ; Joint pain [ ] ; Muscle pain [ ] ; Joint swelling [ ] ; Back Pain [ ] ; Rash [ ]   Psych: Depression[ ] ; Anxiety[ ]   Heme: Bleeding problems [ ] ; Clotting disorders [ ] ; Anemia [ ]   Endocrine: Diabetes [ ] ; Thyroid dysfunction[ ]     Home Medications Prior to Admission medications   Medication Sig Start Date End Date Taking? Authorizing Provider  aspirin 81 MG EC tablet Take 1 tablet (81 mg total) by mouth daily. Swallow whole. 06/07/19   Shatonya Passon, , MD  carvedilol (COREG) 3.125 MG tablet Take 1 tablet (3.125 mg total) by mouth 2 (two) times daily with a meal. 03/15/19   Minnie Shi, , MD  digoxin (LANOXIN) 0.125 MG tablet Take 1 tablet (0.125 mg total) by mouth daily. 06/07/19   Kaysie Michelini, , MD  furosemide (LASIX) 80 MG tablet Take one tablet on Monday and Friday; may take extra tab per week if lower leg swelling or cough develops 03/29/19   Romie Tay, , MD  metoprolol tartrate (LOPRESSOR) 25 MG tablet Take 1 tablet (25 mg total)  by mouth daily. Patient taking differently: Take 12.5 mg by mouth daily.  04/08/19   Maryna Yeagle, Shaune Pascal, MD  pantoprazole (PROTONIX) 40 MG tablet Take 1 tablet (40 mg total) by mouth daily. 03/16/19   Nataniel Gasper, Shaune Pascal, MD  sildenafil (REVATIO) 20 MG tablet Take 1 tablet (20 mg total) by mouth 3 (three) times daily. 03/15/19   Azel Gumina, Shaune Pascal, MD  spironolactone (ALDACTONE) 25 MG tablet Take 1 tablet (25 mg total) by mouth daily. 03/16/19   Raguel Kosloski, Shaune Pascal, MD  warfarin (COUMADIN) 5 MG tablet Take 2.5 mg (1/2 tab) every Monday/Wednesday/Friday and 5 mg (1 tablet) all other days or as directed by HF Clinic 07/29/19   Larey Dresser, MD    Past Medical History: Past Medical History:  Diagnosis  Date  . Dilated cardiomyopathy ([place])   . Dilated cardiomyopathy ([place]) 02/2019  . Polysubstance abuse [facility])     Past Surgical History: Past Surgical History:  Procedure Laterality Date  . INSERTION OF IMPLANTABLE LEFT VENTRICULAR ASSIST DEVICE N/A 02/26/2019   Procedure: INSERTION OF IMPLANTABLE LEFT VENTRICULAR ASSIST DEVICE;  Surgeon: Ivin Poot, MD;  Location: [place];  Service: Open Heart Surgery;  Laterality: N/A;  HM3  . IR THORACENTESIS ASP PLEURAL SPACE W/IMG GUIDE  03/10/2019  . NO PAST SURGERIES    . RIGHT HEART CATH N/A 02/25/2019   Procedure: RIGHT HEART CATH;  Surgeon: Jolaine Artist, MD;  Location: [place] CV LAB;  Service: Cardiovascular;  Laterality: N/A;  . RIGHT/LEFT HEART CATH AND CORONARY ANGIOGRAPHY N/A 02/11/2019   Procedure: RIGHT/LEFT HEART CATH AND CORONARY ANGIOGRAPHY;  Surgeon: Jolaine Artist, MD;  Location: [place] CV LAB;  Service: Cardiovascular;  Laterality: N/A;  . TEE WITHOUT CARDIOVERSION N/A 02/26/2019   Procedure: TRANSESOPHAGEAL ECHOCARDIOGRAM (TEE);  Surgeon: Prescott Gum, Collier Salina, MD;  Location: [place];  Service: Open Heart Surgery;  Laterality: N/A;  . VIDEO BRONCHOSCOPY N/A 02/26/2019   Procedure: Video Bronchoscopy;  Surgeon: Ivin Poot, MD;  Location: [place];  Service: Open Heart Surgery;  Laterality: N/A;    Family History: Family History  Problem Relation Age of Onset  . CAD Father     Social History: Social History   Socioeconomic History  . Marital status: Single    Spouse name: Not on file  . Number of children: Not on file  . Years of education: Not on file  . Highest education level: Not on file  Occupational History  . Not on file  Tobacco Use  . Smoking status: Current Every Day Smoker    Types: Cigarettes  . Smokeless tobacco: Never Used  Vaping Use  . Vaping Use: Some days  Substance and Sexual Activity  . Alcohol use: Yes  . Drug use: Yes    Types: Cocaine  . Sexual activity: Not on file  Other  Topics Concern  . Not on file  Social History Narrative  . Not on file   Social Determinants of Health   Financial Resource Strain: Low Risk   . Difficulty of Paying Living Expenses: Not very hard  Food Insecurity: No Food Insecurity  . Worried About Charity fundraiser in the Last Year: Never true  . Ran Out of Food in the Last Year: Never true  Transportation Needs: Unmet Transportation Needs  . Lack of Transportation (Medical): Yes  . Lack of Transportation (Non-Medical): No  Physical Activity: Sufficiently Active  . Days of Exercise per Week: 7 days  . Minutes of Exercise  per Session: 60 min  Stress:   . Feeling of Stress :   Social Connections:   . Frequency of Communication with Friends and Family:   . Frequency of Social Gatherings with Friends and Family:   . Attends Religious Services:   . Active Member of Clubs or Organizations:   . Attends Banker Meetings:   Marland Kitchen Marital Status:     Allergies:  No Known Allergies  Objective:    Vital Signs:   Temp:  [98.1 F (36.7 C)] 98.1 F (36.7 C) (06/28 1951) Pulse Rate:  [77-104] 104 (06/28 1951) Resp:  [11-14] 11 (06/28 1951) BP: (85-89)/(67-76) 85/67 (06/28 1951) SpO2:  [100 %] 100 % (06/28 1951)   Mean arterial Pressure 80  Physical Exam    General:  Well appearing. No resp difficulty HEENT: Normal Neck: supple. JVP flat . Carotids 2+ bilat; no bruits. No lymphadenopathy or thyromegaly appreciated. Cor: Mechanical heart sounds with LVAD hum present. Lungs: Clear Abdomen: soft, nontender, nondistended. No hepatosplenomegaly. No bruits or masses. Good bowel sounds. Driveline: C/D/I; securement device intact and driveline incorporated Extremities: no cyanosis, clubbing, rash, edema Neuro: alert & orientedx3, cranial nerves grossly intact. moves all 4 extremities w/o difficulty. Affect pleasant   Telemetry   Sinus 90-110 Personally reviewed  EKG   Pending  Labs    Basic Metabolic  Panel: No results for input(s): NA, K, CL, CO2, GLUCOSE, BUN, CREATININE, CALCIUM, MG, PHOS in the last 168 hours.  Liver Function Tests: No results for input(s): AST, ALT, ALKPHOS, BILITOT, PROT, ALBUMIN in the last 168 hours. No results for input(s): LIPASE, AMYLASE in the last 168 hours. No results for input(s): AMMONIA in the last 168 hours.  CBC: Recent Labs  Lab 08/02/19 1933  WBC 8.2  NEUTROABS 4.6  HGB 10.8*  HCT 32.3*  MCV 98.2  PLT 176    Cardiac Enzymes: No results for input(s): CKTOTAL, CKMB, CKMBINDEX, TROPONINI in the last 168 hours.  BNP: BNP (last 3 results) Recent Labs    02/27/19 0433 03/05/19 0056 03/12/19 0542  BNP 447.0* 427.2* 135.5*    ProBNP (last 3 results) No results for input(s): PROBNP in the last 8760 hours.   CBG: No results for input(s): GLUCAP in the last 168 hours.  Coagulation Studies: No results for input(s): LABPROT, INR in the last 72 hours.  Other results:    Imaging    No results found.   Assessment/Plan:    1. Nausea/ Vomiting likely viral gastroenteritis - 2-3 day history w/ subsequent dehydration and hypokalemia - suspect possible food borne. Ate some cantaloupe that he thinks was bad - hydrate w/ IVFs. supp K - check LFTs, amylase and lipase - antiemetics PRN  - hold home diuretics for now - KUB done at OSH. Will f/u   2. Hypokalemia - 2/2 prob#1. 2.7 at outside hospital. IV K give  - repeat BMP now. Give additional K if needed  - monitor closely   3. AKI - due to volume depletion/ATN - has been fluid resuscitated - recheck labs  4. Chronic systolic HF due to severe NICM - Echo EF 10% with biventricular failure - HM-3 VAD implant 02/26/19 - suspect he is dry based on history and labs.  - hydrate w/ IVFs.  - Hold home diuretics, Lasix and spiro.   - Continue sildeanfil for RV support - Continue carvedilol.    5. VAD - VAD interrogated personally. Parameters stable.  - DL looks good - INR  pending. If <  1.6 will need IV heparin.  - LDH pending   I reviewed the LVAD parameters from today, and compared the results to the patient's prior recorded data.  No programming changes were made.  The LVAD is functioning within specified parameters.  The patient performs LVAD self-test daily.  LVAD interrogation was negative for any significant power changes, alarms or PI events/speed drops.  LVAD equipment check completed and is in good working order.  Back-up equipment present.   LVAD education done on emergency procedures and precautions and reviewed exit site care.  Length of Stay: 0  Arvilla Meres, MD 08/02/2019, 7:53 PM  VAD Team Pager [phone] (7am - 7am) +++VAD ISSUES ONLY+++   Advanced Heart Failure Team Pager [phone] (M-F; 7a - 4p)  Please contact [facility] for night-coverage after hours (4p -7a ) and weekends on [email or web address] for all non- LVAD Issues

## 2019-08-02 NOTE — Progress Notes (Signed)
ANTICOAGULATION CONSULT NOTE - Initial Consult  Pharmacy Consult for Heparin / warfarin Indication: LVAD HM3   No Known Allergies  Patient Measurements:   Heparin Dosing Weight:   Vital Signs: Temp: 98.1 F (36.7 C) (06/28 1951) Temp Source: Oral (06/28 1951) BP: 85/67 (06/28 1951) Pulse Rate: 104 (06/28 1951)  Labs: Recent Labs    08/02/19 1933  HGB 10.8*  HCT 32.3*  PLT 176  LABPROT 15.5*  INR 1.3*  CREATININE 2.25*    CrCl cannot be calculated (Unknown ideal weight.).   Medical History: Past Medical History:  Diagnosis Date  . Dilated cardiomyopathy (HCC)   . Dilated cardiomyopathy (HCC) 02/2019  . Polysubstance abuse (HCC)       Assessment: 33yom with LVAD admitted with AKI and low K after 3 days N/V.  MAP stable 70s, feels better now.  Replace K and Mag, INR 1.3 will bridge with low dose heparin 400uts/hr until  INR > 1.8 Warfarin PTA 5mg  TTSS, 2.5mg  MWF  Goal of Therapy:  Heparin level about 0.2  units/ml INR 2-2.5 Monitor platelets by anticoagulation protocol: Yes   Plan:  Warfarin 5mg  x1 tonight Heparin drip low dose 400 uts/hr  Daily Protime, HL, CBC  Monitor s/s bleeding   Pharm.D. CPP, BCPS Clinical Pharmacist [phone] 08/02/2019 8:32 PM

## 2019-08-03 DIAGNOSIS — R112 Nausea with vomiting, unspecified: Secondary | ICD-10-CM

## 2019-08-03 LAB — BASIC METABOLIC PANEL
Anion gap: 12 (ref 5–15)
BUN: 38 mg/dL — ABNORMAL HIGH (ref 6–20)
CO2: 28 mmol/L (ref 22–32)
Calcium: 8.3 mg/dL — ABNORMAL LOW (ref 8.9–10.3)
Chloride: 92 mmol/L — ABNORMAL LOW (ref 98–111)
Creatinine, Ser: 1.98 mg/dL — ABNORMAL HIGH (ref 0.61–1.24)
GFR calc Af Amer: 50 mL/min — ABNORMAL LOW (ref >60)
GFR calc non Af Amer: 43 mL/min — ABNORMAL LOW (ref >60)
Glucose, Bld: 103 mg/dL — ABNORMAL HIGH (ref 70–99)
Potassium: 3.5 mmol/L (ref 3.5–5.1)
Sodium: 132 mmol/L — ABNORMAL LOW (ref 135–145)

## 2019-08-03 LAB — CBC
HCT: 31.4 % — ABNORMAL LOW (ref 39.0–52.0)
Hemoglobin: 10.5 g/dL — ABNORMAL LOW (ref 13.0–17.0)
MCH: 32.8 pg (ref 26.0–34.0)
MCHC: 33.4 g/dL (ref 30.0–36.0)
MCV: 98.1 fL (ref 80.0–100.0)
Platelets: 158 10*3/uL (ref 150–400)
RBC: 3.2 MIL/uL — ABNORMAL LOW (ref 4.22–5.81)
RDW: 15.8 % — ABNORMAL HIGH (ref 11.5–15.5)
WBC: 7.9 10*3/uL (ref 4.0–10.5)
nRBC: 0 % (ref 0.0–0.2)

## 2019-08-03 LAB — PROTIME-INR
INR: 1.3 — ABNORMAL HIGH (ref 0.8–1.2)
Prothrombin Time: 15.2 seconds (ref 11.4–15.2)

## 2019-08-03 LAB — MRSA PCR SCREENING: MRSA by PCR: NEGATIVE

## 2019-08-03 LAB — MAGNESIUM: Magnesium: 2.7 mg/dL — ABNORMAL HIGH (ref 1.7–2.4)

## 2019-08-03 LAB — HEPARIN LEVEL (UNFRACTIONATED): Heparin Unfractionated: <0.1 IU/mL — ABNORMAL LOW (ref 0.30–0.70)

## 2019-08-03 LAB — LACTATE DEHYDROGENASE: LDH: 215 U/L — ABNORMAL HIGH (ref 98–192)

## 2019-08-03 MED ORDER — POTASSIUM CHLORIDE CRYS ER 20 MEQ PO TBCR
40.0000 meq | EXTENDED_RELEASE_TABLET | Freq: Once | ORAL | Status: AC
  Administered 2019-08-03: 40 meq via ORAL
  Filled 2019-08-03: qty 2

## 2019-08-03 MED ORDER — WARFARIN SODIUM 5 MG PO TABS
7.0000 mg | ORAL_TABLET | Freq: Once | ORAL | Status: AC
  Administered 2019-08-03: 7 mg via ORAL
  Filled 2019-08-03: qty 1

## 2019-08-03 MED ORDER — WARFARIN SODIUM 5 MG PO TABS: 5.0000 mg | ORAL_TABLET | Freq: Once | ORAL | Status: DC

## 2019-08-03 NOTE — Plan of Care (Signed)

## 2019-08-03 NOTE — Plan of Care (Signed)
  Problem: Education: Goal: Knowledge of General Education information will improve Description: Including pain rating scale, medication(s)/side effects and non-pharmacologic comfort measures Outcome: Progressing   Problem: Health Behavior/Discharge Planning: Goal: Ability to manage health-related needs will improve Outcome: Progressing   Problem: Clinical Measurements: Goal: Will remain free from infection Outcome: Progressing Goal: Diagnostic test results will improve Outcome: Progressing   Problem: Activity: Goal: Risk for activity intolerance will decrease Outcome: Progressing   Problem: Nutrition: Goal: Adequate nutrition will be maintained Outcome: Progressing   Problem: Coping: Goal: Level of anxiety will decrease Outcome: Progressing   Problem: Safety: Goal: Ability to remain free from injury will improve Outcome: Progressing

## 2019-08-03 NOTE — Progress Notes (Addendum)
ANTICOAGULATION CONSULT NOTE  Pharmacy Consult for Heparin / warfarin Indication: LVAD HM3   No Known Allergies  Patient Measurements: Weight: 55.2 kg (121 lb 11.1 oz) Heparin Dosing Weight:   Vital Signs: Temp: 97.9 F (36.6 C) (06/29 0712) Temp Source: Oral (06/29 0712) BP: 80/61 (06/29 0720) Pulse Rate: 74 (06/29 0720)  Labs: Recent Labs    08/02/19 1933 08/03/19 0254  HGB 10.8* 10.5*  HCT 32.3* 31.4*  PLT 176 158  LABPROT 15.5* 15.2  INR 1.3* 1.3*  HEPARINUNFRC  --  <0.10*  CREATININE 2.25* 1.98*    Estimated Creatinine Clearance: 41.4 mL/min (A) (by C-G formula based on SCr of 1.98 mg/dL (H)).   Medical History: Past Medical History:  Diagnosis Date  . Dilated cardiomyopathy (HCC)   . Dilated cardiomyopathy (HCC) 02/2019  . Polysubstance abuse (HCC)       Assessment: 33yom with LVAD admitted with AKI after 3 days N/V. INR subtherapeutic at 1.3 on admit, bridging with low dose heparin with no titrations until INR >1.8.  INR remains 1.3, CBC and LDH stable, heparin level undetectable, appetite improving. Will boost tonight to facilitate D/C tomorrow.  **Home warfarin dose = 5mg  TTSS, 2.5mg  MWF   Goal of Therapy:  Heparin level about 0.2  units/ml INR 2-2.5 Monitor platelets by anticoagulation protocol: Yes   Plan:  Warfarin 7mg  x1 again tonight Heparin 400 units/h - no titrations Daily INR, CBC, LDH, heparin level   , PharmD, BCPS Clinical Pharmacist [phone] Please check AMION for all [facility] numbers 08/03/2019

## 2019-08-03 NOTE — Progress Notes (Signed)
LVAD Coordinator Rounding Note:  Admitted 08/02/19 due to 2-3 day history of nausea, vomiting, and weakness.   HM III LVAD implanted on 02/26/19 by Dr Prescott Gum under destination therapy criteria.  Pt standing up at bedside. Says he feels "much better" today. He believes that the "spicy" food that his nephew made over the weekend made him sick. 100+ PI events on interrogation. Reports he has only drank 1/2 of a can of soda and a half cup of water. Encouraged increased PO intake. He verbalized understanding.   Vital signs: HR: 71 Doppler Pressure: 74 Automatic BP: 91/66 (73) O2 Sat: 99% on RA Wt: 121.7 lbs  LVAD interrogation reveals:  Speed: 5500 Flow: 4.3 Power: 3.9 w PI: 3.5  Alarms: none Events:  100+ PI events today  Hematocrit: 30  Fixed speed: 5500 Low speed limit: 5100  Drive Line: Exit site maintained by patient's mother weekly. Dressing change due today per bedside RN using weekly kit.    Labs:  LDH trend: 215  INR trend: 1.3  Anticoagulation Plan: -INR Goal: 2.0-2.5 -ASA Dose: 81 mg  Adverse Events on VAD: - Admitted 08/02/19 due to N/V, and weakness. Creatinine elevated 2.7.   Plan/Recommendations:  1. Page VAD coordinator for any drive line or equipment issues 2. Weekly drive line dressing change per bedside RN  Emerson Monte RN [place] Coordinator  Office: [phone]  24/7 Pager: [phone]

## 2019-08-03 NOTE — Progress Notes (Addendum)
Advanced Heart Failure VAD Team Note  PCP-Cardiologist: No primary care provider on file.   Subjective:    Admitted with n/v possible gastroentroentertitis and AKI. Given IV fluids at outside hospital.   Creatinine 2.25>1.98   Feeling better today. Denies N/V. Hungry this morning.    LVAD INTERROGATION:  HeartMate III LVAD:   Flow 4 liters/min, speed 5500, power 4, PI 3.9    Objective:    Vital Signs:   Temp:  [97.9 F (36.6 C)-98.1 F (36.7 C)] 97.9 F (36.6 C) (06/29 0712) Pulse Rate:  [77-104] 84 (06/29 0629) Resp:  [11-16] 16 (06/29 0300) BP: (75-104)/(59-76) 75/60 (06/29 0712) SpO2:  [99 %-100 %] 99 % (06/29 0300) Weight:  [55.2 kg] 55.2 kg (06/29 0300) Last BM Date: 08/02/19 Mean arterial Pressure Doppler 70 personally checked.   Intake/Output:   Intake/Output Summary (Last 24 hours) at 08/03/2019 0718 Last data filed at 08/03/2019 0409 Gross per 24 hour  Intake 770.8 ml  Output 500 ml  Net 270.8 ml     Physical Exam    General:  Thin  No resp difficulty HEENT: normal Neck: supple. JVP 5-6 . Carotids 2+ bilat; no bruits. No lymphadenopathy or thyromegaly appreciated. Cor: Mechanical heart sounds with LVAD hum present. Lungs: clear Abdomen: soft, nontender, nondistended. No hepatosplenomegaly. No bruits or masses. Good bowel sounds. Driveline: C/D/I; securement device intact and driveline incorporated Extremities: no cyanosis, clubbing, rash, edema Neuro: alert & orientedx3, cranial nerves grossly intact. moves all 4 extremities w/o difficulty. Affect pleasant   Telemetry   SR 80 Personally reviewed  EKG    N/a   Labs   Basic Metabolic Panel: Recent Labs  Lab 08/02/19 1933 08/03/19 0254  NA 133* 132*  K 2.9* 3.5  CL 90* 92*  CO2 28 28  GLUCOSE 104* 103*  BUN 41* 38*  CREATININE 2.25* 1.98*  CALCIUM 8.1* 8.3*  MG 1.8 2.7*    Liver Function Tests: Recent Labs  Lab 08/02/19 1933  AST 42*  ALT 19  ALKPHOS 59  BILITOT 2.2*    PROT 6.7  ALBUMIN 3.4*   Recent Labs  Lab 08/02/19 1939  LIPASE 28  AMYLASE 105*   No results for input(s): AMMONIA in the last 168 hours.  CBC: Recent Labs  Lab 08/02/19 1933 08/03/19 0254  WBC 8.2 7.9  NEUTROABS 4.6  --   HGB 10.8* 10.5*  HCT 32.3* 31.4*  MCV 98.2 98.1  PLT 176 158    INR: Recent Labs  Lab 07/29/19 0000 08/02/19 1933 08/03/19 0254  INR 3.6* 1.3* 1.3*    Other results:  EKG:    Imaging    No results found.   Medications:     Scheduled Medications: . aspirin EC  81 mg Oral Daily  . carvedilol  3.125 mg Oral BID WC  . digoxin  0.125 mg Oral Daily  . pantoprazole  40 mg Oral Daily  . sildenafil  20 mg Oral TID  . Warfarin - Pharmacist Dosing Inpatient   Does not apply q1600     Infusions: . heparin 400 Units/hr (08/02/19 2018)     PRN Medications:  acetaminophen, ondansetron (ZOFRAN) IV   Patient Profile  [name] is a 34 y/o male from [place] w/ h/o severe systolic HF due to NICM EF 15% and prior polysubstance abuse (cocaine and ETOH)  Underwent HM-3 LVAD placement on 02/26/19 by Dr. Morton Peters.   Admitted with AKI   Assessment/Plan:   1. Nausea/ Vomiting likely viral gastroenteritis -  2-3 day history w/ subsequent dehydration and hypokalemia - suspect possible food borne. Ate some cantaloupe that he thinks was bad - Improved over night. Hungry. We will see how he tolerates a diet.  - - LFTs normal.  - antiemetics PRN  - Continue to hold home diuretics for now - KUB done at OSH.   2. Hypokalemia - 2/2 prob#1. 2.7 at outside hospital. - K up to 3.5   - Supp K   3. AKI - due to volume depletion/ATN - has been fluid resuscitated - Creatinine trending down. 2.25>1.98   4. Chronic systolic HF due to severe NICM - Echo EF 10% with biventricular failure - HM-3 VAD implant 02/26/19 - suspect he is dry based on history and labs.  - hydrate w/ IVFs.  - Hold home diuretics, Lasix and spiro.   - Continue  sildeanfil for RV support - Continue carvedilol.Hold Map < 65 - Continue digoxin. Check level in am.   5. VAD -VAD interrogated personally. Parameters stable. - DLlooks good - INR 1.3. On heparin drip + coumadin.  - LDH stable.   Ambulate.    I reviewed the LVAD parameters from today, and compared the results to the patient's prior recorded data.  No programming changes were made.  The LVAD is functioning within specified parameters.  The patient performs LVAD self-test daily.  LVAD interrogation was negative for any significant power changes, alarms or PI events/speed drops.  LVAD equipment check completed and is in good working order.  Back-up equipment present.   LVAD education done on emergency procedures and precautions and reviewed exit site care.  Length of Stay: 1  Amy Clegg, NP 08/03/2019, 7:18 AM  VAD Team --- VAD ISSUES ONLY--- Pager [phone] (7am - 7am)  Advanced Heart Failure Team  Pager [phone] (M-F; 7a - 4p)  Please contact [facility] for night-coverage after hours (4p -7a ) and weekends on [email or web address]  Patient seen and examined with the above-signed Advanced Practice Provider and/or Housestaff. I personally reviewed laboratory data, imaging studies and relevant notes. I independently examined the patient and formulated the important aspects of the plan. I have edited the note to reflect any of my changes or salient points. I have personally discussed the plan with the patient and/or family.  He looks much better today. Tolerating po intake. Renal function improving. Hypokalemia improved. MAPs stable. VAD interrogated personally. Parameters stable. INR 1.3 on heparin no bleeding  General:  NAD.  HEENT: normal  Neck: supple. JVP not elevated.  Carotids 2+ bilat; no bruits. No lymphadenopathy or thryomegaly appreciated. Cor: LVAD hum.  Lungs: Clear. Abdomen:  soft, nontender, non-distended. No hepatosplenomegaly. No bruits or masses. Good bowel sounds.  Driveline site clean. Anchor in place.  Extremities: no cyanosis, clubbing, rash. Warm no edema  Neuro: alert & oriented x 3. No focal deficits. Moves all 4 without problem   Much improved overnight with volume resuscitation. INR low. Now on heparin. VAD interrogated personally. Parameters stable. MAPs ok. Continue to supp K. Push fluids. Creatinine improving. Likely home when INR > = 1.6  Arvilla Meres, MD  12:58 PM

## 2019-08-04 LAB — CBC
HCT: 29.4 % — ABNORMAL LOW (ref 39.0–52.0)
Hemoglobin: 9.8 g/dL — ABNORMAL LOW (ref 13.0–17.0)
MCH: 32.8 pg (ref 26.0–34.0)
MCHC: 33.3 g/dL (ref 30.0–36.0)
MCV: 98.3 fL (ref 80.0–100.0)
Platelets: 148 10*3/uL — ABNORMAL LOW (ref 150–400)
RBC: 2.99 MIL/uL — ABNORMAL LOW (ref 4.22–5.81)
RDW: 15.3 % (ref 11.5–15.5)
WBC: 4.6 10*3/uL (ref 4.0–10.5)
nRBC: 0 % (ref 0.0–0.2)

## 2019-08-04 LAB — HEPARIN LEVEL (UNFRACTIONATED): Heparin Unfractionated: <0.1 IU/mL — ABNORMAL LOW (ref 0.30–0.70)

## 2019-08-04 LAB — BASIC METABOLIC PANEL
Anion gap: 10 (ref 5–15)
BUN: 17 mg/dL (ref 6–20)
CO2: 26 mmol/L (ref 22–32)
Calcium: 8.6 mg/dL — ABNORMAL LOW (ref 8.9–10.3)
Chloride: 96 mmol/L — ABNORMAL LOW (ref 98–111)
Creatinine, Ser: 1.22 mg/dL (ref 0.61–1.24)
GFR calc Af Amer: >60 mL/min (ref >60)
GFR calc non Af Amer: >60 mL/min (ref >60)
Glucose, Bld: 104 mg/dL — ABNORMAL HIGH (ref 70–99)
Potassium: 3.8 mmol/L (ref 3.5–5.1)
Sodium: 132 mmol/L — ABNORMAL LOW (ref 135–145)

## 2019-08-04 LAB — PROTIME-INR
INR: 1.2 (ref 0.8–1.2)
Prothrombin Time: 14.4 seconds (ref 11.4–15.2)

## 2019-08-04 LAB — DIGOXIN LEVEL: Digoxin Level: 0.4 ng/mL — ABNORMAL LOW (ref 0.8–2.0)

## 2019-08-04 LAB — LACTATE DEHYDROGENASE: LDH: 240 U/L — ABNORMAL HIGH (ref 98–192)

## 2019-08-04 MED ORDER — SODIUM CHLORIDE 0.9 % IV BOLUS
1000.0000 mL | Freq: Once | INTRAVENOUS | Status: AC
  Administered 2019-08-04: 1000 mL via INTRAVENOUS

## 2019-08-04 MED ORDER — WARFARIN SODIUM 7.5 MG PO TABS
7.5000 mg | ORAL_TABLET | Freq: Once | ORAL | Status: AC
  Administered 2019-08-04: 7.5 mg via ORAL
  Filled 2019-08-04: qty 1

## 2019-08-04 NOTE — Progress Notes (Signed)
ANTICOAGULATION CONSULT NOTE  Pharmacy Consult for Heparin / warfarin Indication: LVAD HM3   No Known Allergies  Patient Measurements: Weight: 55.6 kg (122 lb 9.2 oz) Heparin Dosing Weight:   Vital Signs: Temp: 98.3 F (36.8 C) (06/30 0417) Temp Source: Oral (06/30 0417) BP: 92/67 (06/30 0618) Pulse Rate: 73 (06/30 0618)  Labs: Recent Labs    08/02/19 1933 08/02/19 1933 08/03/19 0254 08/04/19 0222  HGB 10.8*   < > 10.5* 9.8*  HCT 32.3*  --  31.4* 29.4*  PLT 176  --  158 148*  LABPROT 15.5*  --  15.2 14.4  INR 1.3*  --  1.3* 1.2  HEPARINUNFRC  --   --  <0.10* <0.10*  CREATININE 2.25*  --  1.98* 1.22   < > = values in this interval not displayed.    Estimated Creatinine Clearance: 67.7 mL/min (by C-G formula based on SCr of 1.22 mg/dL).   Medical History: Past Medical History:  Diagnosis Date   Dilated cardiomyopathy (HCC)    Dilated cardiomyopathy (HCC) 02/2019   Polysubstance abuse (HCC)       Assessment: 33yom with LVAD admitted with AKI after 3 days N/V. INR subtherapeutic at 1.3 on admit, bridging with low dose heparin with no titrations until INR >1.8.  INR 1.2 despite boosted dose, CBC and LDH stable.  **Home warfarin dose = 5mg  TTSS, 2.5mg  MWF   Goal of Therapy:  Heparin level about 0.2  units/ml INR 2-2.5 Monitor platelets by anticoagulation protocol: Yes   Plan:  Warfarin 7.5mg  x1 tonight Heparin 400 units/h - no titrations Daily INR, CBC, LDH, heparin level   , PharmD, BCPS Clinical Pharmacist [phone] Please check AMION for all [facility] numbers 08/04/2019

## 2019-08-04 NOTE — Progress Notes (Signed)
LVAD Coordinator Rounding Note:  Admitted 08/02/19 due to 2-3 day history of nausea, vomiting, and weakness.   HM III LVAD implanted on 02/26/19 by Dr Donata Clay under destination therapy criteria.  Pt asleep in bed this morning on my arrival. 100+ PI events on interrogation. Flow 3.4 PI 10.5.  HR 90s with frequent PVCs and NSVT. MAP 75. Per Dr Gala Romney will give 1 L normal saline bolus.   Vital signs: HR: 90 with frequent PVCs Doppler Pressure:  Automatic BP: 109/64 (75) O2 Sat: 100% on RA Wt: 121.7> 122.6 lbs  LVAD interrogation reveals:  Speed: 5500 Flow: 3.4 - 3.6  Power: 4.1 w PI: 9.6 - 10.2   Alarms: none Events:  100+ PI events today  Hematocrit: 30  Fixed speed: 5500 Low speed limit: 5100  Drive Line: Exit site maintained by patient's mother weekly. Next dressing change due 08/10/19 per bedside RN.   Labs:  LDH trend: 215>240  INR trend: 1.3>1.2  Anticoagulation Plan: -INR Goal: 2.0-2.5 -ASA Dose: 81 mg  Adverse Events on VAD: - Admitted 08/02/19 due to N/V, and weakness. Creatinine elevated 2.7.   Plan/Recommendations:  1. Page VAD coordinator for any drive line or equipment issues 2. Weekly drive line dressing change per bedside RN  Alyce Pagan RN VAD Coordinator  Office: [phone]  24/7 Pager: [phone]

## 2019-08-04 NOTE — Progress Notes (Addendum)
Advanced Heart Failure VAD Team Note  PCP-Cardiologist: No primary care provider on file.   Subjective:    Admitted with n/v possible gastroentroentertitis and AKI. Given IV fluids at outside hospital.   Creatinine 2.25>1.98 >1.22   INR 1.2  Denies SOB. Denies n/v    LVAD INTERROGATION:  HeartMate III LVAD:   Flow 3.4  liters/min, speed 5500, power 4.1 , PI 9.6 > 100 PI events.   Objective:    Vital Signs:   Temp:  [97.9 F (36.6 C)-98.5 F (36.9 C)] 97.9 F (36.6 C) (06/30 1213) Pulse Rate:  [57-92] 57 (06/30 1213) Resp:  [15-19] 19 (06/30 0806) BP: (85-109)/(64-86) 108/65 (06/30 1213) SpO2:  [97 %-100 %] 100 % (06/30 1213) Weight:  [55.6 kg] 55.6 kg (06/30 0417) Last BM Date: 08/03/19 Mean arterial Pressure 70s    Intake/Output:   Intake/Output Summary (Last 24 hours) at 08/04/2019 1217 Last data filed at 08/04/2019 0417 Gross per 24 hour  Intake 480 ml  Output 1550 ml  Net -1070 ml     Physical Exam   Physical Exam: GENERAL: No acute distress. HEENT: normal  NECK: Supple, JVP flat .  2+ bilaterally, no bruits.  No lymphadenopathy or thyromegaly appreciated.   CARDIAC:  Mechanical heart sounds with LVAD hum present.  LUNGS:  Clear to auscultation bilaterally.  ABDOMEN:  Soft, round, nontender, positive bowel sounds x4.     LVAD exit site: well-healed and incorporated.  Dressing dry and intact.  No erythema or drainage.  Stabilization device present and accurately applied.  Driveline dressing is being changed daily per sterile technique. EXTREMITIES:  Warm and dry, no cyanosis, clubbing, rash or edema  NEUROLOGIC:  Alert and oriented x 3.    No aphasia.  No dysarthria.  Affect pleasant.       Telemetry   Sr 80s personally reviewed.   EKG    N/a   Labs   Basic Metabolic Panel: Recent Labs  Lab 08/02/19 1933 08/03/19 0254 08/04/19 0222  NA 133* 132* 132*  K 2.9* 3.5 3.8  CL 90* 92* 96*  CO2 28 28 26   GLUCOSE 104* 103* 104*  BUN 41* 38* 17   CREATININE 2.25* 1.98* 1.22  CALCIUM 8.1* 8.3* 8.6*  MG 1.8 2.7*  --     Liver Function Tests: Recent Labs  Lab 08/02/19 1933  AST 42*  ALT 19  ALKPHOS 59  BILITOT 2.2*  PROT 6.7  ALBUMIN 3.4*   Recent Labs  Lab 08/02/19 1939  LIPASE 28  AMYLASE 105*   No results for input(s): AMMONIA in the last 168 hours.  CBC: Recent Labs  Lab 08/02/19 1933 08/03/19 0254 08/04/19 0222  WBC 8.2 7.9 4.6  NEUTROABS 4.6  --   --   HGB 10.8* 10.5* 9.8*  HCT 32.3* 31.4* 29.4*  MCV 98.2 98.1 98.3  PLT 176 158 148*    INR: Recent Labs  Lab 07/29/19 0000 08/02/19 1933 08/03/19 0254 08/04/19 0222  INR 3.6* 1.3* 1.3* 1.2    Other results:  EKG:    Imaging   No results found.   Medications:     Scheduled Medications: . aspirin EC  81 mg Oral Daily  . carvedilol  3.125 mg Oral BID WC  . digoxin  0.125 mg Oral Daily  . pantoprazole  40 mg Oral Daily  . sildenafil  20 mg Oral TID  . warfarin  7.5 mg Oral ONCE-1600  . Warfarin - Pharmacist Dosing Inpatient   Does not  apply q1600    Infusions: . heparin 400 Units/hr (08/02/19 2018)    PRN Medications: acetaminophen, ondansetron (ZOFRAN) IV   Patient Profile  [name] is a 34 y/o male from [place] w/ h/o severe systolic HF due to NICM EF 15% and prior polysubstance abuse (cocaine and ETOH)  Underwent HM-3 LVAD placement on 02/26/19 by Dr. Morton Peters.   Admitted with AKI   Assessment/Plan:   1. Nausea/ Vomiting likely viral gastroenteritis - 2-3 day history w/ subsequent dehydration and hypokalemia - suspect possible food borne. Ate some cantaloupe that he thinks was bad - LFTs normal.  - antiemetics PRN  - Continue to hold home diuretics for now - KUB done at OSH.  - Resolved. Tolerating a diet.   2. Hypokalemia - 2/2 prob#1. 2.7 at outside hospital. - K stable.   3. AKI - due to volume depletion/ATN - has been fluid resuscitated - Creatinine trending down. 2.25>1.98 >1.2  -Resolved with  fluids.   4. Chronic systolic HF due to severe NICM - Echo EF 10% with biventricular failure - HM-3 VAD implant 02/26/19 - suspect he is dry based on history and labs.  - hydrate w/ IVFs.  - Hold home diuretics, Lasix and spiro.   - Continue sildeanfil for RV support - Continue carvedilol.Hold Map < 65 - Continue digoxin, dig level 0.4   5. VAD -VAD interrogated. > 100 PI events. Receiving another liter of NS.  - DLlooks good - INR 1.2. On heparin drip + coumadin.  - LDH stable.   Home when INR 1.6 or >    I reviewed the LVAD parameters from today, and compared the results to the patient's prior recorded data.  No programming changes were made.  The LVAD is functioning within specified parameters.  The patient performs LVAD self-test daily.  LVAD interrogation was negative for any significant power changes, alarms or PI events/speed drops.  LVAD equipment check completed and is in good working order.  Back-up equipment present.   LVAD education done on emergency procedures and precautions and reviewed exit site care.  Length of Stay: 2  [name], NP 08/04/2019, 12:17 PM  VAD Team --- VAD ISSUES ONLY--- Pager [phone] (7am - 7am)  Advanced Heart Failure Team  Pager [phone] (M-F; 7a - 4p)  Please contact [facility] for night-coverage after hours (4p -7a ) and weekends on [email or web address]   Patient seen and examined with the above-signed Advanced Practice Provider and/or Housestaff. I personally reviewed laboratory data, imaging studies and relevant notes. I independently examined the patient and formulated the important aspects of the plan. I have edited the note to reflect any of my changes or salient points. I have personally discussed the plan with the patient and/or family.  Feels ok. No more n/v. Creatinine back to baseline. Holding food down. Wants to go home. INR 1.2 on heparin/warfarin. No bleeding.   General:  NAD.  HEENT: normal  Neck: supple. JVP not elevated.   Carotids 2+ bilat; no bruits. No lymphadenopathy or thryomegaly appreciated. Cor: LVAD hum.  Lungs: Clear. Abdomen: soft, nontender, non-distended. No hepatosplenomegaly. No bruits or masses. Good bowel sounds. Driveline site clean. Anchor in place.  Extremities: no cyanosis, clubbing, rash. Warm no edema  Neuro: alert & oriented x 3. No focal deficits. Moves all 4 without problem   He is much improved. Clinically ready for discharge but INR remains low. Continue heparin/warfarin. VAD interrogated personally. Parameters stable. Discussed dosing with PharmD personally. Home when INR 1.6 or  greater.   Arvilla Meres, MD  9:22 PM

## 2019-08-04 NOTE — Plan of Care (Signed)

## 2019-08-04 NOTE — Plan of Care (Signed)

## 2019-08-05 ENCOUNTER — Inpatient Hospital Stay (HOSPITAL_COMMUNITY): Payer: Medicaid - Out of State

## 2019-08-05 DIAGNOSIS — I5022 Chronic systolic (congestive) heart failure: Secondary | ICD-10-CM

## 2019-08-05 LAB — LACTATE DEHYDROGENASE: LDH: 237 U/L — ABNORMAL HIGH (ref 98–192)

## 2019-08-05 LAB — PROTIME-INR
INR: 1.3 — ABNORMAL HIGH (ref 0.8–1.2)
Prothrombin Time: 16.1 seconds — ABNORMAL HIGH (ref 11.4–15.2)

## 2019-08-05 LAB — CBC
HCT: 30.7 % — ABNORMAL LOW (ref 39.0–52.0)
Hemoglobin: 10.3 g/dL — ABNORMAL LOW (ref 13.0–17.0)
MCH: 32.9 pg (ref 26.0–34.0)
MCHC: 33.6 g/dL (ref 30.0–36.0)
MCV: 98.1 fL (ref 80.0–100.0)
Platelets: 130 10*3/uL — ABNORMAL LOW (ref 150–400)
RBC: 3.13 MIL/uL — ABNORMAL LOW (ref 4.22–5.81)
RDW: 15.1 % (ref 11.5–15.5)
WBC: 5.2 10*3/uL (ref 4.0–10.5)
nRBC: 0 % (ref 0.0–0.2)

## 2019-08-05 LAB — BASIC METABOLIC PANEL
Anion gap: 7 (ref 5–15)
BUN: 10 mg/dL (ref 6–20)
CO2: 25 mmol/L (ref 22–32)
Calcium: 8.8 mg/dL — ABNORMAL LOW (ref 8.9–10.3)
Chloride: 103 mmol/L (ref 98–111)
Creatinine, Ser: 1.04 mg/dL (ref 0.61–1.24)
GFR calc Af Amer: >60 mL/min (ref >60)
GFR calc non Af Amer: >60 mL/min (ref >60)
Glucose, Bld: 108 mg/dL — ABNORMAL HIGH (ref 70–99)
Potassium: 3.6 mmol/L (ref 3.5–5.1)
Sodium: 135 mmol/L (ref 135–145)

## 2019-08-05 LAB — HEPARIN LEVEL (UNFRACTIONATED): Heparin Unfractionated: <0.1 IU/mL — ABNORMAL LOW (ref 0.30–0.70)

## 2019-08-05 LAB — ECHOCARDIOGRAM LIMITED: Weight: 1989.43 oz

## 2019-08-05 MED ORDER — WARFARIN SODIUM 3 MG PO TABS: 6.0000 mg | ORAL_TABLET | Freq: Once | ORAL | Status: DC

## 2019-08-05 MED ORDER — POTASSIUM CHLORIDE CRYS ER 20 MEQ PO TBCR
40.0000 meq | EXTENDED_RELEASE_TABLET | Freq: Once | ORAL | Status: AC
  Administered 2019-08-05: 40 meq via ORAL
  Filled 2019-08-05: qty 2

## 2019-08-05 MED ORDER — WARFARIN SODIUM 7.5 MG PO TABS
7.5000 mg | ORAL_TABLET | Freq: Once | ORAL | Status: AC
  Administered 2019-08-05: 7.5 mg via ORAL
  Filled 2019-08-05: qty 1

## 2019-08-05 NOTE — Plan of Care (Signed)
  Problem: Education: Goal: Knowledge of General Education information will improve Description: Including pain rating scale, medication(s)/side effects and non-pharmacologic comfort measures 08/05/2019 1549 by Billey Gosling D, RN Outcome: Progressing 08/05/2019 1502 by Excell Seltzer, Mathius Birkeland D, RN Outcome: Progressing   Problem: Health Behavior/Discharge Planning: Goal: Ability to manage health-related needs will improve 08/05/2019 1549 by Excell Seltzer, Mahira Petras D, RN Outcome: Progressing 08/05/2019 1502 by Excell Seltzer, Dynasti Kerman D, RN Outcome: Progressing   Problem: Clinical Measurements: Goal: Ability to maintain clinical measurements within normal limits will improve 08/05/2019 1549 by Excell Seltzer, Jarreau Callanan D, RN Outcome: Progressing 08/05/2019 1502 by Excell Seltzer, Tiger Spieker D, RN Outcome: Progressing Goal: Will remain free from infection 08/05/2019 1549 by Excell Seltzer, Kalani Sthilaire D, RN Outcome: Progressing 08/05/2019 1502 by Excell Seltzer, Cherita Hebel D, RN Outcome: Progressing Goal: Diagnostic test results will improve 08/05/2019 1549 by Excell Seltzer, Massiah Minjares D, RN Outcome: Progressing 08/05/2019 1502 by Excell Seltzer, Smt Lokey D, RN Outcome: Progressing Goal: Respiratory complications will improve 08/05/2019 1549 by Billey Gosling D, RN Outcome: Progressing 08/05/2019 1502 by Excell Seltzer, Miquel Lamson D, RN Outcome: Progressing Goal: Cardiovascular complication will be avoided 08/05/2019 1549 by Billey Gosling D, RN Outcome: Progressing 08/05/2019 1502 by Excell Seltzer, Halleigh Comes D, RN Outcome: Progressing   Problem: Activity: Goal: Risk for activity intolerance will decrease 08/05/2019 1549 by Billey Gosling D, RN Outcome: Progressing 08/05/2019 1502 by Excell Seltzer, Kei Langhorst D, RN Outcome: Progressing   Problem: Nutrition: Goal: Adequate nutrition will be maintained 08/05/2019 1549 by Excell Seltzer, Jaden Batchelder D, RN Outcome: Progressing 08/05/2019 1502 by Excell Seltzer, Charla Criscione D, RN Outcome: Progressing   Problem: Coping: Goal: Level of anxiety will decrease 08/05/2019 1549 by Billey Gosling D, RN Outcome:  Progressing 08/05/2019 1502 by Excell Seltzer, Laren Orama D, RN Outcome: Progressing   Problem: Elimination: Goal: Will not experience complications related to bowel motility 08/05/2019 1549 by Billey Gosling D, RN Outcome: Progressing 08/05/2019 1502 by Excell Seltzer, Jamiah Homeyer D, RN Outcome: Progressing Goal: Will not experience complications related to urinary retention 08/05/2019 1549 by Excell Seltzer, Sharae Zappulla D, RN Outcome: Progressing 08/05/2019 1502 by Excell Seltzer, Weichel Scarbrough D, RN Outcome: Progressing   Problem: Pain Managment: Goal: General experience of comfort will improve 08/05/2019 1549 by Excell Seltzer, Jalise Zawistowski D, RN Outcome: Progressing 08/05/2019 1502 by Excell Seltzer, Shaquera Ansley D, RN Outcome: Progressing   Problem: Safety: Goal: Ability to remain free from injury will improve 08/05/2019 1549 by Excell Seltzer, Gicela Schwarting D, RN Outcome: Progressing 08/05/2019 1502 by Excell Seltzer, Letzy Gullickson D, RN Outcome: Progressing   Problem: Skin Integrity: Goal: Risk for impaired skin integrity will decrease 08/05/2019 1549 by Billey Gosling D, RN Outcome: Progressing 08/05/2019 1502 by Excell Seltzer, Alanni Vader D, RN Outcome: Progressing   Problem: Education: Goal: Patient will understand all VAD equipment and how it functions Outcome: Progressing Goal: Patient will be able to verbalize current INR target range and antiplatelet therapy for discharge home Outcome: Progressing   Problem: Cardiac: Goal: LVAD will function as expected and patient will experience no clinical alarms Outcome: Progressing

## 2019-08-05 NOTE — Plan of Care (Signed)

## 2019-08-05 NOTE — Progress Notes (Addendum)
ANTICOAGULATION CONSULT NOTE  Pharmacy Consult for Heparin / warfarin Indication: LVAD HM3   No Known Allergies  Patient Measurements: Weight: 56.4 kg (124 lb 5.4 oz) Heparin Dosing Weight:   Vital Signs: Temp: 98.3 F (36.8 C) (07/01 0756) Temp Source: Oral (07/01 0756) BP: 82/69 (07/01 0756) Pulse Rate: 86 (07/01 0756)  Labs: Recent Labs    08/03/19 0254 08/03/19 0254 08/04/19 0222 08/05/19 0335  HGB 10.5*   < > 9.8* 10.3*  HCT 31.4*  --  29.4* 30.7*  PLT 158  --  148* 130*  LABPROT 15.2  --  14.4 16.1*  INR 1.3*  --  1.2 1.3*  HEPARINUNFRC <0.10*  --  <0.10* <0.10*  CREATININE 1.98*  --  1.22 1.04   < > = values in this interval not displayed.    Estimated Creatinine Clearance: 80.6 mL/min (by C-G formula based on SCr of 1.04 mg/dL).   Medical History: Past Medical History:  Diagnosis Date  . Dilated cardiomyopathy (HCC)   . Dilated cardiomyopathy (HCC) 02/2019  . Polysubstance abuse (HCC)       Assessment: 33yom with LVAD admitted with AKI after 3 days N/V. INR subtherapeutic at 1.3 on admit, bridging with low dose heparin with no titrations until INR >1.8.  INR 1.3 despite boosted dose, CBC and LDH stable.  **Home warfarin dose = 5mg  TTSS, 2.5mg  MWF  Past several INR's as outpatient have been above goal (3.6 on 6/24)   Goal of Therapy:  Heparin level about 0.2  units/ml INR 2-2.5 Monitor platelets by anticoagulation protocol: Yes   Plan:  Warfarin 7.5 mg x1 tonight.  Expect INR bump soon after getting several doses above his usual home dose. Heparin 400 units/h - no titrations Daily INR, CBC, LDH, heparin level   7/24, Reece Leader, [facility] Clinical Pharmacist  08/05/2019 11:28 AM   [facility] pharmacy phone numbers are listed on [email or web address]

## 2019-08-05 NOTE — Progress Notes (Addendum)
Advanced Heart Failure VAD Team Note  PCP-Cardiologist: No primary care provider on file.   Subjective:    Admitted with n/v possible gastroentroentertitis and AKI. Given IV fluids at outside hospital.   Creatinine 2.25>1.98 >1.22 >1.04   INR 1.3  Yesterday given IVF.   Feeling ok.    LVAD INTERROGATION:  HeartMate III LVAD:   Flow 3.8  liters/min, speed 5500, power 4 , PI 4.4  > 100 PI events.   Objective:    Vital Signs:   Temp:  [97.9 F (36.6 C)-99.1 F (37.3 C)] 98.3 F (36.8 C) (07/01 0756) Pulse Rate:  [57-95] 86 (07/01 0756) Resp:  [14-19] 19 (07/01 0756) BP: (82-109)/(55-72) 82/69 (07/01 0756) SpO2:  [94 %-100 %] 98 % (07/01 0756) Weight:  [56.4 kg] 56.4 kg (07/01 0354) Last BM Date: 08/04/19 Mean arterial Pressure 70s   Intake/Output:   Intake/Output Summary (Last 24 hours) at 08/05/2019 0804 Last data filed at 08/05/2019 0428 Gross per 24 hour  Intake 1792 ml  Output 3000 ml  Net -1208 ml     Physical Exam   Physical Exam: GENERAL: No acute distress. HEENT: normal  NECK: Supple, JVP  Flat .  2+ bilaterally, no bruits.  No lymphadenopathy or thyromegaly appreciated.   CARDIAC:  Mechanical heart sounds with LVAD hum present.  LUNGS:  Clear to auscultation bilaterally.  ABDOMEN:  Soft, round, nontender, positive bowel sounds x4.     LVAD exit site: well-healed and incorporated.  Dressing dry and intact.  No erythema or drainage.  Stabilization device present and accurately applied.  Driveline dressing is being changed daily per sterile technique. EXTREMITIES:  Warm and dry, no cyanosis, clubbing, rash or edema  NEUROLOGIC:  Alert and oriented x 3.    No aphasia.  No dysarthria.  Affect pleasant.       Telemetry   SR 80s with PVCs  EKG    N/a   Labs   Basic Metabolic Panel: Recent Labs  Lab 08/02/19 1933 08/02/19 1933 08/03/19 0254 08/04/19 0222 08/05/19 0335  NA 133*  --  132* 132* 135  K 2.9*  --  3.5 3.8 3.6  CL 90*  --  92* 96*  103  CO2 28  --  28 26 25   GLUCOSE 104*  --  103* 104* 108*  BUN 41*  --  38* 17 10  CREATININE 2.25*  --  1.98* 1.22 1.04  CALCIUM 8.1*   < > 8.3* 8.6* 8.8*  MG 1.8  --  2.7*  --   --    < > = values in this interval not displayed.    Liver Function Tests: Recent Labs  Lab 08/02/19 1933  AST 42*  ALT 19  ALKPHOS 59  BILITOT 2.2*  PROT 6.7  ALBUMIN 3.4*   Recent Labs  Lab 08/02/19 1939  LIPASE 28  AMYLASE 105*   No results for input(s): AMMONIA in the last 168 hours.  CBC: Recent Labs  Lab 08/02/19 1933 08/03/19 0254 08/04/19 0222 08/05/19 0335  WBC 8.2 7.9 4.6 5.2  NEUTROABS 4.6  --   --   --   HGB 10.8* 10.5* 9.8* 10.3*  HCT 32.3* 31.4* 29.4* 30.7*  MCV 98.2 98.1 98.3 98.1  PLT 176 158 148* 130*    INR: Recent Labs  Lab 08/02/19 1933 08/03/19 0254 08/04/19 0222 08/05/19 0335  INR 1.3* 1.3* 1.2 1.3*    Other results:  EKG:    Imaging   No results found.  Medications:     Scheduled Medications:  aspirin EC  81 mg Oral Daily   carvedilol  3.125 mg Oral BID WC   digoxin  0.125 mg Oral Daily   pantoprazole  40 mg Oral Daily   sildenafil  20 mg Oral TID   Warfarin - Pharmacist Dosing Inpatient   Does not apply q1600    Infusions:  heparin 400 Units/hr (08/05/19 0623)    PRN Medications: acetaminophen, ondansetron (ZOFRAN) IV   Patient Profile  [name] is a 34 y/o male from [place] w/ h/o severe systolic HF due to NICM EF 15% and prior polysubstance abuse (cocaine and ETOH)  Underwent HM-3 LVAD placement on 02/26/19 by Dr. Morton Peters.   Admitted with AKI   Assessment/Plan:   1. Nausea/ Vomiting likely viral gastroenteritis - 2-3 day history w/ subsequent dehydration and hypokalemia - suspect possible food borne. Ate some cantaloupe that he thinks was bad - LFTs normal.  - antiemetics PRN  - Continue to hold home diuretics for now - KUB done at OSH.  - Resolved. Tolerating a diet.   2. Hypokalemia - 2/2  prob#1. 2.7 at outside hospital. - K stable.   3. AKI - due to volume depletion/ATN - has been fluid resuscitated - Creatinine peaked 2.2, today 1.0   -Resolved with fluids.   4. Chronic systolic HF due to severe NICM - Echo EF 10% with biventricular failure - HM-3 VAD implant 02/26/19 - Volume status stable.   - Hold home diuretics, Lasix and spiro.   - Continue sildeanfil for RV support - Continue carvedilol.Hold Map < 65 - Continue digoxin, dig level 0.4   5. VAD -VAD interrogated. > 100 PI events. Receiving another liter of NS 07/3019  - Still with PI events. . Cut back speed to 5400. Check limited ECHO ? Remodeling.  - DLlooks good - INR 1.3. On heparin drip + coumadin.  - LDH stable.   Home when INR 1.6 or >    I reviewed the LVAD parameters from today, and compared the results to the patient's prior recorded data.  No programming changes were made.  The LVAD is functioning within specified parameters.  The patient performs LVAD self-test daily.  LVAD interrogation was negative for any significant power changes, alarms or PI events/speed drops.  LVAD equipment check completed and is in good working order.  Back-up equipment present.   LVAD education done on emergency procedures and precautions and reviewed exit site care.  Length of Stay: 3  Amy Clegg, NP 08/05/2019, 8:04 AM  VAD Team --- VAD ISSUES ONLY--- Pager [phone] (7am - 7am)  Advanced Heart Failure Team  Pager [phone] (M-F; 7a - 4p)  Please contact [facility] for night-coverage after hours (4p -7a ) and weekends on [email or web address]   Patient seen and examined with the above-signed Advanced Practice Provider and/or Housestaff. I personally reviewed laboratory data, imaging studies and relevant notes. I independently examined the patient and formulated the important aspects of the plan. I have edited the note to reflect any of my changes or salient points. I have personally discussed the plan with the  patient and/or family.  Feeling better. No ab pain. No n/v. Having frequent PI events. INR remains low (1.3). On heparin. No bleeding. LDH ok.   General:  NAD.  HEENT: normal  Neck: supple. JVP not elevated.  Carotids 2+ bilat; no bruits. No lymphadenopathy or thryomegaly appreciated. Cor: LVAD hum.  Lungs: Clear. Abdomen:  soft, nontender, non-distended. No hepatosplenomegaly.  No bruits or masses. Good bowel sounds. Driveline site clean. Anchor in place.  Extremities: no cyanosis, clubbing, rash. Warm no edema  Neuro: alert & oriented x 3. No focal deficits. Moves all 4 without problem   Echo done at bedside for which I was present. LVEF much improved probably in 40-45% range. RV moderately HK. VAD cannula still aligned with MV. Turned VAD speed down 5500 -> 5300. Home when INR 1.6 or greater.   Arvilla Meres, MD  6:38 PM

## 2019-08-05 NOTE — Discharge Summary (Signed)
Advanced Heart Failure Team  Discharge Summary   Patient ID: Jon Martin MRN: [id number], DOB/AGE: 1985-04-22 34 y.o. Admit date: 08/02/2019 D/C date:     08/06/2019   Primary Discharge Diagnoses:  Viral Gastroenteritis  Acute Kidney Injury  Hypokalemia Hypomagnesemia   Secondary Discharge Diagnoses:  Chronic Systolic Heart Failure Nonischemic Cardiomyopathy  S/p HeartMate III LVAD Chronic Anticoagulation Therapy  (coumadin)   Hospital Course:   [name] is a 34 y/o male from [place] w/ h/o severe systolic HF due to NICM EF 15% and prior polysubstance abuse (cocaine and ETOH)  Underwent HM-3 LVAD placement on 02/26/19 by Dr. Morton Peters. Extubated 1/25. Required milrinone post op for RV support. Milrinone eventually weaned and Co-ox remained stable of inotropes at 61%. Later placed on sildenafil for continued RV support. Post op recovery complicated by hypoxic respiratory failure, moderate sized rt pneumothorax requiring CT placement.  Presented to outside [facility] in [place] on 6/28 w/ 2-3 day h/o nausea, vomiting and weakness. Thinks he at some cantaloupe that was bad on Friday. Felt to have gastroenteritis.  In outside ED, KUB negative, labs suggested volume depletion and showed hypokalemia w/ K 2.7 and Scr 2.7. No VAD alarms. He was transferred to [facility], same day, for further care. IVFs + IV K given in the ED in [place] prior to transfer here.   On arrival to [facility], BMP showed AKI w/ SCr 2.25. K 2.9. Mg 1.8. Lipase negative. Amylase mildly elevated at 105.  VAD parameters stable. Home diuretics were held and he was treated w/ IVFs and electrolytes aggressively supplemented. N/V resolved and he was able to better tolerate POs. He improved w/ volume resuscitation. SCr normalized, 2.25>>1.98>>1.22>>1.04. K and Mg adequately repleated.   He was noted to have frequent PI events on VAD interrogation.  Repeat limited echo w/ remodeling. LVEF much improved  in 40-45% range. RV moderately HK. VAD  cannula still aligned with MV. Turned VAD speed down 5500 -> 5300. Hematocrit reduced to 20.   His INR was subtherapeutic day of d/c at 1.3. He was discharged home w/ Lovenox bridge. Lovenox administration training performed prior to d/c. His volume status remained stable (d/c wt 124 lb, previous dry wt 127 lb). His home diuretics, Lasix (takes 2 days/ week) and Spironolactone were both held on d/c due to MAPs in upper 60s. Can resume at outpatient f/u.   On 7/2, he was last seen and examined by Dr. Gala Romney and felt stable for d/c home. LVAD clinic f/u is scheduled for next week on 08/11/19.    Discharge Weight Range: 124 lb (56.4 kg)  Discharge Vitals: Blood pressure (!) 95/59, pulse 86, temperature 97.6 F (36.4 C), temperature source Axillary, resp. rate 20, weight 56.4 kg, SpO2 98 %.  Labs: Lab Results  Component Value Date   WBC 5.6 08/06/2019   HGB 10.2 (L) 08/06/2019   HCT 30.9 (L) 08/06/2019   MCV 98.7 08/06/2019   PLT 139 (L) 08/06/2019    Recent Labs  Lab 08/02/19 1933 08/03/19 0254 08/06/19 0207  NA 133*   < > 134*  K 2.9*   < > 3.7  CL 90*   < > 102  CO2 28   < > 25  BUN 41*   < > 8  CREATININE 2.25*   < > 1.01  CALCIUM 8.1*   < > 9.2  PROT 6.7  --   --   BILITOT 2.2*  --   --   ALKPHOS 59  --   --  ALT 19  --   --   AST 42*  --   --   GLUCOSE 104*   < > 102*   < > = values in this interval not displayed.   Lab Results  Component Value Date   CHOL 89 02/11/2019   HDL 25 (L) 02/11/2019   LDLCALC 52 02/11/2019   TRIG 58 02/11/2019   BNP (last 3 results) Recent Labs    02/27/19 0433 03/05/19 0056 03/12/19 0542  BNP 447.0* 427.2* 135.5*    ProBNP (last 3 results) No results for input(s): PROBNP in the last 8760 hours.   Diagnostic Studies/Procedures   ECHOCARDIOGRAM LIMITED  Result Date: 08/05/2019    ECHOCARDIOGRAM LIMITED REPORT   Patient Name:   Jon Martin Date of Exam: 08/05/2019 Medical Rec #:  [id number]       Height:       68.0 in  Accession #:    [id number]      Weight:       124.3 lb Date of Birth:  [birth year]        BSA:          1.670 m Patient Age:    33 years        BP:           82/69 mmHg Patient Gender: M               HR:           78 bpm. Exam Location:  Inpatient Procedure: Limited Echo, Cardiac Doppler and Color Doppler                                 MODIFIED REPORT: This report was modified by Weston Brass MD on 08/05/2019 due to EF assessment.  Indications:     I50.9* Heart failure (unspecified) LVAD. RAMP study. Dr.                  Gala Romney present.  History:         Patient has prior history of Echocardiogram examinations, most                  recent 03/08/2019. Cardiomyopathy and CHF; Abnormal ECG. LVAD                  present. RAMP study. Cardiac shock.  Sonographer:     Sheralyn Boatman RDCS Referring Phys:  [id number] AMY D CLEGG Diagnosing Phys: Weston Brass MD IMPRESSIONS  1. Ramp study performed for left ventricular assist device. See Findings.  2. Left ventricular ejection fraction, by estimation, is 45 to 50%. There is mild left ventricular hypertrophy.  3. Right ventricular systolic function is moderately reduced. The right ventricular size is severely enlarged.  4. Please see clinical documentation for further information on ramp study. FINDINGS  Left Ventricular Assist Device: Inflow cannula is visualized. Inflow cannula velocity: not measured. Outflow cannula is visualized. Outflow cannula velocity: not measured. 5400 RPM The septum is: left No suck down. The aortic valve: does not open Aortic regurgitation: none Mitral regurgitation: trivial 5300 RPM The septum is: neutral No suck down. The aortic valve: intermittently opens. Aortic regurgitation: not assessed Mitral regurgitation: not assessed.  Left Ventricle: Left ventricular ejection fraction, by estimation, is 45 to 50%. There is mild left ventricular hypertrophy. Right Ventricle: The right ventricular size is severely enlarged. Right ventricular systolic  function is moderately reduced. Tricuspid Valve: Tricuspid  valve regurgitation is trivial. Aorta: The aortic root is normal in size and structure.  LEFT VENTRICLE PLAX 2D LVIDd:         2.50 cm LVIDs:         1.90 cm LV PW:         1.20 cm LV IVS:        1.10 cm  LEFT ATRIUM         Index LA diam:    2.90 cm 1.74 cm/m   AORTA Ao Root diam: 3.10 cm Weston Brass MD Electronically signed by Weston Brass MD Signature Date/Time: 08/05/2019/2:57:40 PM    Final (Updated)     Discharge Medications   Allergies as of 08/06/2019   No Known Allergies     Medication List    STOP taking these medications   furosemide 80 MG tablet Commonly known as: LASIX   metoprolol tartrate 25 MG tablet Commonly known as: LOPRESSOR   spironolactone 25 MG tablet Commonly known as: ALDACTONE     TAKE these medications   aspirin 81 MG EC tablet Take 1 tablet (81 mg total) by mouth daily. Swallow whole.   carvedilol 3.125 MG tablet Commonly known as: COREG Take 1 tablet (3.125 mg total) by mouth 2 (two) times daily with a meal.   digoxin 0.125 MG tablet Commonly known as: LANOXIN Take 1 tablet (0.125 mg total) by mouth daily.   enoxaparin 30 MG/0.3ML injection Commonly known as: Lovenox Inject 0.3 mLs (30 mg total) into the skin 2 (two) times daily for 5 doses.   pantoprazole 40 MG tablet Commonly known as: PROTONIX Take 1 tablet (40 mg total) by mouth daily.   sildenafil 20 MG tablet Commonly known as: REVATIO Take 1 tablet (20 mg total) by mouth 3 (three) times daily.   warfarin 5 MG tablet Commonly known as: COUMADIN Take as directed. If you are unsure how to take this medication, talk to your nurse or doctor. Original instructions: Take 2.5 mg (1/2 tab) every Monday/Wednesday/Friday and 5 mg (1 tablet) all other days or as directed by HF Clinic What changed:   how much to take  how to take this  when to take this       Disposition   The patient will be discharged in stable  condition to home.   Follow-up Information    [facility] Follow up on 08/11/2019.   Specialty: Cardiology Why: LVAD Clinic at 11:00 AM  Contact information: [address] [id number] mc [place] [address] [phone]                Duration of Discharge Encounter: Greater than 35 minutes   Signed, Knute Neu  08/06/2019, 3:08 PM

## 2019-08-05 NOTE — Progress Notes (Signed)
LVAD Coordinator Rounding Note:  Admitted 08/02/19 due to 2-3 day history of nausea, vomiting, and weakness.   HM III LVAD implanted on 02/26/19 by Dr Donata Clay under destination therapy criteria.  Pt asleep in bed this morning on my arrival. 100+ PI events on interrogation.   HR 90s with frequent PVCs and NSVT. Amy Clegg into see the pt. Flow 2.9. Speed dropped to 5300 from 5400.  Vital signs: Temp: 98.3 HR: 86 with frequent PVCs Doppler Pressure: 84  Automatic BP: 82/69 (75) O2 Sat: 98% on RA Wt: 121.7> 122.6>124 lbs  LVAD interrogation reveals:  Speed: 5500 Flow: 2.9 Power: 3.7 w PI: 9.2  Alarms: none Events:  100+ PI events today  Hematocrit: 30  Fixed speed: 5400 Low speed limit: 5100  Drive Line: Exit site maintained by patient's mother weekly. Next dressing change due 08/10/19 per bedside RN.   Labs:  LDH trend: 215>240>237  INR trend: 1.3>1.2>1.3  Anticoagulation Plan: -INR Goal: 2.0-2.5 -ASA Dose: 81 mg  Adverse Events on VAD: - Admitted 08/02/19 due to N/V, and weakness. Creatinine elevated 2.7.   Plan/Recommendations:  1. Page VAD coordinator for any drive line or equipment issues 2. Weekly drive line dressing change per bedside RN 3. Pt will have echo today  Carlton Adam RN VAD Coordinator  Office: [phone]  24/7 Pager: [phone]

## 2019-08-05 NOTE — Progress Notes (Signed)
  Echocardiogram 2D Echocardiogram has been performed.  Jon Martin 08/05/2019, 9:53 AM

## 2019-08-06 LAB — CBC
HCT: 30.9 % — ABNORMAL LOW (ref 39.0–52.0)
Hemoglobin: 10.2 g/dL — ABNORMAL LOW (ref 13.0–17.0)
MCH: 32.6 pg (ref 26.0–34.0)
MCHC: 33 g/dL (ref 30.0–36.0)
MCV: 98.7 fL (ref 80.0–100.0)
Platelets: 139 10*3/uL — ABNORMAL LOW (ref 150–400)
RBC: 3.13 MIL/uL — ABNORMAL LOW (ref 4.22–5.81)
RDW: 15.2 % (ref 11.5–15.5)
WBC: 5.6 10*3/uL (ref 4.0–10.5)
nRBC: 0 % (ref 0.0–0.2)

## 2019-08-06 LAB — BASIC METABOLIC PANEL
Anion gap: 7 (ref 5–15)
BUN: 8 mg/dL (ref 6–20)
CO2: 25 mmol/L (ref 22–32)
Calcium: 9.2 mg/dL (ref 8.9–10.3)
Chloride: 102 mmol/L (ref 98–111)
Creatinine, Ser: 1.01 mg/dL (ref 0.61–1.24)
GFR calc Af Amer: >60 mL/min (ref >60)
GFR calc non Af Amer: >60 mL/min (ref >60)
Glucose, Bld: 102 mg/dL — ABNORMAL HIGH (ref 70–99)
Potassium: 3.7 mmol/L (ref 3.5–5.1)
Sodium: 134 mmol/L — ABNORMAL LOW (ref 135–145)

## 2019-08-06 LAB — LACTATE DEHYDROGENASE: LDH: 253 U/L — ABNORMAL HIGH (ref 98–192)

## 2019-08-06 LAB — HEPARIN LEVEL (UNFRACTIONATED): Heparin Unfractionated: <0.1 IU/mL — ABNORMAL LOW (ref 0.30–0.70)

## 2019-08-06 LAB — PROTIME-INR
INR: 1.3 — ABNORMAL HIGH (ref 0.8–1.2)
Prothrombin Time: 16 seconds — ABNORMAL HIGH (ref 11.4–15.2)

## 2019-08-06 MED ORDER — ENOXAPARIN SODIUM 30 MG/0.3ML ~~LOC~~ SOLN
30.0000 mg | Freq: Once | SUBCUTANEOUS | Status: AC
  Administered 2019-08-06: 30 mg via SUBCUTANEOUS
  Filled 2019-08-06: qty 0.3

## 2019-08-06 MED ORDER — ENOXAPARIN SODIUM 30 MG/0.3ML ~~LOC~~ SOLN
30.0000 mg | Freq: Two times a day (BID) | SUBCUTANEOUS | 0 refills | Status: DC
Start: 2019-08-06 — End: 2019-09-20

## 2019-08-06 MED ORDER — POTASSIUM CHLORIDE CRYS ER 20 MEQ PO TBCR
40.0000 meq | EXTENDED_RELEASE_TABLET | Freq: Once | ORAL | Status: AC
  Administered 2019-08-06: 40 meq via ORAL
  Filled 2019-08-06: qty 2

## 2019-08-06 MED ORDER — WARFARIN SODIUM 7.5 MG PO TABS
7.5000 mg | ORAL_TABLET | Freq: Once | ORAL | Status: AC
  Administered 2019-08-06: 7.5 mg via ORAL
  Filled 2019-08-06: qty 1

## 2019-08-06 MED FILL — ENOXAPARIN SODIUM 30 MG/0.3: 30 | 3 days supply | Qty: 2 | Fill #0

## 2019-08-06 NOTE — Progress Notes (Signed)
Discharge instructions given to patients mom, [name]. Patients mother given education and teaching regarding lovenox injections at home. RN demonstrated with lovenox injection before discharge, mother states understanding of lovenox injections.

## 2019-08-06 NOTE — Plan of Care (Signed)
  Problem: Education: Goal: Knowledge of General Education information will improve Description Including pain rating scale, medication(s)/side effects and non-pharmacologic comfort measures Outcome: Progressing   Problem: Health Behavior/Discharge Planning: Goal: Ability to manage health-related needs will improve Outcome: Progressing   

## 2019-08-06 NOTE — Plan of Care (Signed)
?  Problem: Education: ?Goal: Knowledge of General Education information will improve ?Description: Including pain rating scale, medication(s)/side effects and non-pharmacologic comfort measures ?Outcome: Progressing ?  ?Problem: Health Behavior/Discharge Planning: ?Goal: Ability to manage health-related needs will improve ?Outcome: Progressing ?  ?Problem: Clinical Measurements: ?Goal: Ability to maintain clinical measurements within normal limits will improve ?Outcome: Progressing ?Goal: Will remain free from infection ?Outcome: Progressing ?Goal: Diagnostic test results will improve ?Outcome: Progressing ?Goal: Respiratory complications will improve ?Outcome: Progressing ?Goal: Cardiovascular complication will be avoided ?Outcome: Progressing ?  ?Problem: Activity: ?Goal: Risk for activity intolerance will decrease ?Outcome: Progressing ?  ?Problem: Nutrition: ?Goal: Adequate nutrition will be maintained ?Outcome: Progressing ?  ?Problem: Coping: ?Goal: Level of anxiety will decrease ?Outcome: Progressing ?  ?Problem: Elimination: ?Goal: Will not experience complications related to bowel motility ?Outcome: Progressing ?Goal: Will not experience complications related to urinary retention ?Outcome: Progressing ?  ?Problem: Pain Managment: ?Goal: General experience of comfort will improve ?Outcome: Progressing ?  ?Problem: Safety: ?Goal: Ability to remain free from injury will improve ?Outcome: Progressing ?  ?Problem: Skin Integrity: ?Goal: Risk for impaired skin integrity will decrease ?Outcome: Progressing ?  ?Problem: Education: ?Goal: Patient will understand all VAD equipment and how it functions ?Outcome: Progressing ?Goal: Patient will be able to verbalize current INR target range and antiplatelet therapy for discharge home ?Outcome: Progressing ?  ?Problem: Cardiac: ?Goal: LVAD will function as expected and patient will experience no clinical alarms ?Outcome: Progressing ?  ?

## 2019-08-06 NOTE — Progress Notes (Signed)
LVAD Coordinator Rounding Note:  Admitted 08/02/19 due to 2-3 day history of nausea, vomiting, and weakness.   HM III LVAD implanted on 02/26/19 by Dr Donata Clay under destination therapy criteria.  Pt sitting up in bed watching TV. 36 PI events today on interrogation. Several PI events overnight. HR 90s with frequent PVCs and NSVT. Flow 2.6 - 2.8. PI 8.1. Dropped hematocrit to 20; flow increased to 3.7 PI 5.1.   Plan to discharge home today. INR 1.3. Plan for 2 days of Lovenox bridge. Will see in VAD clinic next week for hospital follow up, and to check labs.     Vital signs: Temp: 97.6 HR: 93 with frequent PVCs Doppler Pressure: 82  Automatic BP: 95/59 (68) O2 Sat: 98% on RA Wt: 121.7> 122.6>124> 124.3 lbs  LVAD interrogation reveals:  Speed: 5300 Flow: 3.7  Power: 3.8 w PI: 5.1  Alarms: none Events:  36 PI events today Hematocrit: 20 (DO NOT ADJUST)  Fixed speed: 5300 Low speed limit: 5000  Drive Line: Exit site maintained by patient's mother weekly. Next dressing change due 08/10/19 per bedside RN.   Labs:  LDH trend: 215>240>237>253  INR trend: 1.3>1.2>1.3>1.3  Anticoagulation Plan: -INR Goal: 2.0-2.5 -ASA Dose: 81 mg  Adverse Events on VAD: - Admitted 08/02/19 due to N/V, and weakness. Creatinine elevated 2.7.   Plan/Recommendations:  1. Page VAD coordinator for any drive line or equipment issues 2. Weekly drive line dressing change per bedside RN   Alyce Pagan RN VAD Coordinator  Office: [phone]  24/7 Pager: [phone]

## 2019-08-06 NOTE — Progress Notes (Addendum)
Advanced Heart Failure VAD Team Note  PCP-Cardiologist: No primary care provider on file.   Subjective:    Admitted with n/v possible gastroentroentertitis and AKI. Given IV fluids at outside hospital.   Creatinine 2.25>1.98 >1.22 >1.04>1.01. K stable 3.7   INR remains subtherapeutic at 1.3  Limited echo done yesterday showed LVEF much improved in 40-45% range. RV moderately HK. VAD cannula still aligned with MV. Turned VAD speed down 5500 -> 5300.  Still w/ flows ~2.8 on VAD interrogation today. Hematocrit lowered to 20, w/ flow improving to 3.5.   He feels well. No complaints today. Eager to go home.   LVAD INTERROGATION:  HeartMate III LVAD:   Flow 3.5  liters/min, speed 5350, power 3.7 , PI 5.8  Multiple PI events    Objective:    Vital Signs:   Temp:  [97.5 F (36.4 C)-98.6 F (37 C)] 97.6 F (36.4 C) (07/02 0838) Pulse Rate:  [66-91] 86 (07/02 0838) Resp:  [12-20] 20 (07/02 0838) BP: (84-101)/(54-89) 95/59 (07/02 0838) SpO2:  [97 %-100 %] 98 % (07/02 0838) Weight:  [56.4 kg] 56.4 kg (07/02 0624) Last BM Date: 08/05/19 Mean arterial Pressure 70s   Intake/Output:   Intake/Output Summary (Last 24 hours) at 08/06/2019 0925 Last data filed at 08/06/2019 8127 Gross per 24 hour  Intake 1256 ml  Output 4025 ml  Net -2769 ml     Physical Exam   GENERAL: well appearing young male No acute distress. HEENT: normal  NECK: Supple, no JVD .  2+ bilaterally, no bruits.  No lymphadenopathy or thyromegaly appreciated.   CARDIAC:  + Mechanical heart sounds with LVAD hum present.  LUNGS:  Clear to auscultation bilaterally, no wheezing   ABDOMEN:  Soft, round, nontender, positive bowel sounds x4.     LVAD exit site: well-healed and incorporated.  Dressing dry and intact.  No erythema or drainage.  Stabilization device present and accurately applied.  Driveline dressing is being changed daily per sterile technique. EXTREMITIES:  Thin extremities, Warm and dry, no cyanosis,  clubbing, rash or edema  NEUROLOGIC:  Alert and oriented x 3.    No aphasia.  No dysarthria.  Affect pleasant.       Telemetry   NSR 80s   EKG    N/a   Labs   Basic Metabolic Panel: Recent Labs  Lab 08/02/19 1933 08/02/19 1933 08/03/19 0254 08/03/19 0254 08/04/19 0222 08/05/19 0335 08/06/19 0207  NA 133*  --  132*  --  132* 135 134*  K 2.9*  --  3.5  --  3.8 3.6 3.7  CL 90*  --  92*  --  96* 103 102  CO2 28  --  28  --  26 25 25   GLUCOSE 104*  --  103*  --  104* 108* 102*  BUN 41*  --  38*  --  17 10 8   CREATININE 2.25*  --  1.98*  --  1.22 1.04 1.01  CALCIUM 8.1*   < > 8.3*   < > 8.6* 8.8* 9.2  MG 1.8  --  2.7*  --   --   --   --    < > = values in this interval not displayed.    Liver Function Tests: Recent Labs  Lab 08/02/19 1933  AST 42*  ALT 19  ALKPHOS 59  BILITOT 2.2*  PROT 6.7  ALBUMIN 3.4*   Recent Labs  Lab 08/02/19 1939  LIPASE 28  AMYLASE 105*   No  results for input(s): AMMONIA in the last 168 hours.  CBC: Recent Labs  Lab 08/02/19 1933 08/03/19 0254 08/04/19 0222 08/05/19 0335 08/06/19 0207  WBC 8.2 7.9 4.6 5.2 5.6  NEUTROABS 4.6  --   --   --   --   HGB 10.8* 10.5* 9.8* 10.3* 10.2*  HCT 32.3* 31.4* 29.4* 30.7* 30.9*  MCV 98.2 98.1 98.3 98.1 98.7  PLT 176 158 148* 130* 139*    INR: Recent Labs  Lab 08/02/19 1933 08/03/19 0254 08/04/19 0222 08/05/19 0335 08/06/19 0207  INR 1.3* 1.3* 1.2 1.3* 1.3*    Other results:  EKG:    Imaging   ECHOCARDIOGRAM LIMITED  Result Date: 08/05/2019    ECHOCARDIOGRAM LIMITED REPORT   Patient Name:   Jon Martin Date of Exam: 08/05/2019 Medical Rec #:  [id number]       Height:       68.0 in Accession #:    [id number]      Weight:       124.3 lb Date of Birth:  12-Jun-1985        BSA:          1.670 m Patient Age:    34 years        BP:           82/69 mmHg Patient Gender: M               HR:           78 bpm. Exam Location:  Inpatient Procedure: Limited Echo, Cardiac Doppler and Color  Doppler                                 MODIFIED REPORT: This report was modified by Weston Brass MD on 08/05/2019 due to EF assessment.  Indications:     I50.9* Heart failure (unspecified) LVAD. RAMP study. Dr.                  Gala Romney present.  History:         Patient has prior history of Echocardiogram examinations, most                  recent 03/08/2019. Cardiomyopathy and CHF; Abnormal ECG. LVAD                  present. RAMP study. Cardiac shock.  Sonographer:     Sheralyn Boatman RDCS Referring Phys:  [id number] AMY D CLEGG Diagnosing Phys: Weston Brass MD IMPRESSIONS  1. Ramp study performed for left ventricular assist device. See Findings.  2. Left ventricular ejection fraction, by estimation, is 45 to 50%. There is mild left ventricular hypertrophy.  3. Right ventricular systolic function is moderately reduced. The right ventricular size is severely enlarged.  4. Please see clinical documentation for further information on ramp study. FINDINGS  Left Ventricular Assist Device: Inflow cannula is visualized. Inflow cannula velocity: not measured. Outflow cannula is visualized. Outflow cannula velocity: not measured. 5400 RPM The septum is: left No suck down. The aortic valve: does not open Aortic regurgitation: none Mitral regurgitation: trivial 5300 RPM The septum is: neutral No suck down. The aortic valve: intermittently opens. Aortic regurgitation: not assessed Mitral regurgitation: not assessed.  Left Ventricle: Left ventricular ejection fraction, by estimation, is 45 to 50%. There is mild left ventricular hypertrophy. Right Ventricle: The right ventricular size is severely enlarged. Right ventricular systolic function is moderately reduced. Tricuspid  Valve: Tricuspid valve regurgitation is trivial. Aorta: The aortic root is normal in size and structure.  LEFT VENTRICLE PLAX 2D LVIDd:         2.50 cm LVIDs:         1.90 cm LV PW:         1.20 cm LV IVS:        1.10 cm  LEFT ATRIUM         Index LA diam:     2.90 cm 1.74 cm/m   AORTA Ao Root diam: 3.10 cm Weston Brass MD Electronically signed by Weston Brass MD Signature Date/Time: 08/05/2019/2:57:40 PM    Final (Updated)      Medications:     Scheduled Medications: . aspirin EC  81 mg Oral Daily  . carvedilol  3.125 mg Oral BID WC  . digoxin  0.125 mg Oral Daily  . pantoprazole  40 mg Oral Daily  . sildenafil  20 mg Oral TID  . Warfarin - Pharmacist Dosing Inpatient   Does not apply q1600    Infusions: . heparin 400 Units/hr (08/05/19 0623)    PRN Medications: acetaminophen, ondansetron (ZOFRAN) IV   Patient Profile  [name] is a 34 y/o male from [place] w/ h/o severe systolic HF due to NICM EF 15% and prior polysubstance abuse (cocaine and ETOH)  Underwent HM-3 LVAD placement on 02/26/19 by Dr. Morton Peters.   Admitted with AKI   Assessment/Plan:    1. Nausea/ Vomiting likely viral gastroenteritis - 2-3 day history w/ subsequent dehydration and hypokalemia - suspect possible food borne. Ate some cantaloupe that he thinks was bad - LFTs normal.  - KUB done at OSH unremarkable  - self limiting and now resolved. Tolerating diet.   2. Hypokalemia - 2/2 prob#1. 2.7 at outside hospital. - improved w/ supplementation  - K stable 3.7 today.   3. AKI - due to volume depletion/ATN - has been fluid resuscitated - Creatinine peaked 2.2, today 1.0  - Resolved with fluids.   4. Chronic systolic HF due to severe NICM - Echo EF 10% with biventricular failure - HM-3 VAD implant 02/26/19 - Volume status stable.   - continue to hold home diuretics for now (MAP 68) - Continue sildeanfil for RV support - Continue carvedilol.Hold Map < 65 - Continue digoxin, dig level 0.4   5. VAD - multiple PI events  -VAD interrogated.  - repeat limited echo w/ remodeling. LVEF much improved probably in 40-45% range. RV moderately HK. VAD cannula still aligned with MV. Turned VAD speed down 5500 -> 5300. Hematocrit reduced to  20.  - DLlooks good - INR 1.3. On heparin drip + coumadin. Plan home to day w/ Lovenox bridge. Will need INR check early next week. Goal 2.0-2.5. Discussed dosing w/ pharmacy, will continue previous coumadin home dose at d/c. - LDH stable.   Plan d/c home today w/ Lovenox bridge. Will hold home lasix and spiro until f/u clinic appt next week.    I reviewed the LVAD parameters from today, and compared the results to the patient's prior recorded data.  No programming changes were made.  The LVAD is functioning within specified parameters.  The patient performs LVAD self-test daily.  LVAD interrogation was negative for any significant power changes, alarms or PI events/speed drops.  LVAD equipment check completed and is in good working order.  Back-up equipment present.   LVAD education done on emergency procedures and precautions and reviewed exit site care.  Length of  Stay: [phone] PA-C 08/06/2019, 9:25 AM  VAD Team --- VAD ISSUES ONLY--- Pager [phone] (7am - 7am)  Advanced Heart Failure Team  Pager [phone] (M-F; 7a - 4p)  Please contact [facility] for night-coverage after hours (4p -7a ) and weekends on [email or web address]  Patient seen and examined with the above-signed Advanced Practice Provider and/or Housestaff. I personally reviewed laboratory data, imaging studies and relevant notes. I independently examined the patient and formulated the important aspects of the plan. I have edited the note to reflect any of my changes or salient points. I have personally discussed the plan with the patient and/or family.  Feels much better. Good appetite. Np n/v. Renal function normalized.  Echo yesterday with LVEF 45-50% moderate RV dysfunction. VAD speed turned down.   General:  NAD.  HEENT: normal  Neck: supple. JVP not elevated.  Carotids 2+ bilat; no bruits. No lymphadenopathy or thryomegaly appreciated. Cor: LVAD hum.  Lungs: Clear. Abdomen:  soft, nontender, non-distended. No  hepatosplenomegaly. No bruits or masses. Good bowel sounds. Driveline site clean. Anchor in place.  Extremities: no cyanosis, clubbing, rash. Warm no edema  Neuro: alert & oriented x 3. No focal deficits. Moves all 4 without problem   Stable for d/c today with lovenox bridge x 2 days (INR 1.3). D/w PharmD. VAD interrogated personally. Parameters stable.  Arvilla Meres, MD  4:14 PM

## 2019-08-06 NOTE — Progress Notes (Signed)
ANTICOAGULATION CONSULT NOTE  Pharmacy Consult for Heparin / warfarin > lovenox Indication: LVAD HM3   No Known Allergies  Patient Measurements: Weight: 56.4 kg (124 lb 5.4 oz) Heparin Dosing Weight:   Vital Signs: Temp: 97.6 F (36.4 C) (07/02 0838) Temp Source: Axillary (07/02 0838) BP: 95/59 (07/02 0838) Pulse Rate: 86 (07/02 0838)  Labs: Recent Labs    08/04/19 0222 08/04/19 0222 08/05/19 0335 08/06/19 0207  HGB 9.8*   < > 10.3* 10.2*  HCT 29.4*  --  30.7* 30.9*  PLT 148*  --  130* 139*  LABPROT 14.4  --  16.1* 16.0*  INR 1.2  --  1.3* 1.3*  HEPARINUNFRC <0.10*  --  <0.10* <0.10*  CREATININE 1.22  --  1.04 1.01   < > = values in this interval not displayed.    Estimated Creatinine Clearance: 83 mL/min (by C-G formula based on SCr of 1.01 mg/dL).   Medical History: Past Medical History:  Diagnosis Date  . Dilated cardiomyopathy (HCC)   . Dilated cardiomyopathy (HCC) 02/2019  . Polysubstance abuse (HCC)       Assessment: 33yom with LVAD admitted with AKI after 3 days N/V. INR subtherapeutic at 1.3 on admit, bridging with low dose heparin with no titrations until INR >1.8.  INR 1.3 despite boosted dose, CBC and LDH stable.  **Home warfarin dose = 5mg  TTSS, 2.5mg  MWF  Past several INR's as outpatient have been above goal (3.6 on 6/24)   Goal of Therapy:  Heparin level about 0.2  units/ml INR 2-2.5 Monitor platelets by anticoagulation protocol: Yes   Plan:  Warfarin 7.5 mg x1 more dose prior to discharge today.  Expect INR bump soon after getting several doses above his usual home dose.  Recommend resuming prior to admission dose of Coumadin at discharge (listed above).  Will stop IV heparin and start lovenox 30 mg BID (0.5 mg bid) prior to discharge.    7/24, Reece Leader, BCCP Clinical Pharmacist  08/06/2019 9:51 AM   [facility] pharmacy phone numbers are listed on [email or web address]

## 2019-08-09 ENCOUNTER — Encounter (HOSPITAL_COMMUNITY): Payer: Medicaid - Out of State

## 2019-08-10 ENCOUNTER — Other Ambulatory Visit (HOSPITAL_COMMUNITY): Payer: Self-pay | Admitting: *Deleted

## 2019-08-10 DIAGNOSIS — Z7901 Long term (current) use of anticoagulants: Secondary | ICD-10-CM

## 2019-08-10 DIAGNOSIS — Z95811 Presence of heart assist device: Secondary | ICD-10-CM

## 2019-08-11 ENCOUNTER — Telehealth (HOSPITAL_COMMUNITY): Payer: Self-pay | Admitting: Pharmacist

## 2019-08-11 ENCOUNTER — Encounter (HOSPITAL_COMMUNITY): Payer: Medicaid - Out of State

## 2019-08-11 NOTE — Telephone Encounter (Signed)
Patient was recently discharged with an INR of 1.3. He needs an INR recheck but his transportation fell through for this week. He cannot be seen in clinic until 08/26/19 so he will need to get his INR drawn locally at [facility].  I tried to reach out to patient to schedule INR draw multiple times but the line was busy. Will continue trying to reach patient so repeat INR draw can be scheduled.   Karle Plumber, PharmD, BCPS, BCCP, CPP Heart Failure Clinic Pharmacist [phone]

## 2019-08-16 ENCOUNTER — Encounter (HOSPITAL_COMMUNITY): Payer: 59

## 2019-08-16 ENCOUNTER — Telehealth (HOSPITAL_COMMUNITY): Payer: Self-pay | Admitting: *Deleted

## 2019-08-16 NOTE — Telephone Encounter (Signed)
Spoke with patient regarding need for INR draw. Pt states he will go for labs tomorrow. Best number to contact patient is his brother [name] Henry's cell phone.   Alyce Pagan RN VAD Coordinator  Office: [phone]  24/7 Pager: [phone]

## 2019-08-18 ENCOUNTER — Ambulatory Visit (HOSPITAL_COMMUNITY): Payer: Self-pay | Admitting: Pharmacist

## 2019-08-18 LAB — POCT INR: INR: 1.8 — AB (ref 2.0–3.0)

## 2019-08-18 NOTE — Progress Notes (Signed)
LVAD INR 

## 2019-08-26 ENCOUNTER — Other Ambulatory Visit: Payer: Self-pay

## 2019-08-26 ENCOUNTER — Ambulatory Visit (HOSPITAL_COMMUNITY): Payer: Self-pay | Admitting: Pharmacist

## 2019-08-26 ENCOUNTER — Ambulatory Visit (HOSPITAL_COMMUNITY)
Admission: RE | Admit: 2019-08-26 | Discharge: 2019-08-26 | Disposition: A | Discharge status: Home / Self Care | Payer: Medicaid - Out of State | Source: Ambulatory Visit | Attending: Internal Medicine | Admitting: Internal Medicine

## 2019-08-26 VITALS — BP 90/0 | HR 60 | Ht 68.0 in | Wt 124.8 lb

## 2019-08-26 DIAGNOSIS — Z7901 Long term (current) use of anticoagulants: Secondary | ICD-10-CM | POA: Diagnosis not present

## 2019-08-26 DIAGNOSIS — I10 Essential (primary) hypertension: Secondary | ICD-10-CM

## 2019-08-26 DIAGNOSIS — F1010 Alcohol abuse, uncomplicated: Secondary | ICD-10-CM | POA: Diagnosis not present

## 2019-08-26 DIAGNOSIS — I5022 Chronic systolic (congestive) heart failure: Secondary | ICD-10-CM | POA: Diagnosis not present

## 2019-08-26 DIAGNOSIS — Z7982 Long term (current) use of aspirin: Secondary | ICD-10-CM | POA: Insufficient documentation

## 2019-08-26 DIAGNOSIS — I5082 Biventricular heart failure: Secondary | ICD-10-CM | POA: Diagnosis not present

## 2019-08-26 DIAGNOSIS — I428 Other cardiomyopathies: Secondary | ICD-10-CM | POA: Diagnosis not present

## 2019-08-26 DIAGNOSIS — I420 Dilated cardiomyopathy: Secondary | ICD-10-CM | POA: Diagnosis not present

## 2019-08-26 DIAGNOSIS — Z95811 Presence of heart assist device: Secondary | ICD-10-CM | POA: Diagnosis not present

## 2019-08-26 DIAGNOSIS — Z79899 Other long term (current) drug therapy: Secondary | ICD-10-CM | POA: Diagnosis not present

## 2019-08-26 LAB — CBC
HCT: 34.9 % — ABNORMAL LOW (ref 39.0–52.0)
Hemoglobin: 11.1 g/dL — ABNORMAL LOW (ref 13.0–17.0)
MCH: 33.5 pg (ref 26.0–34.0)
MCHC: 31.8 g/dL (ref 30.0–36.0)
MCV: 105.4 fL — ABNORMAL HIGH (ref 80.0–100.0)
Platelets: 236 10*3/uL (ref 150–400)
RBC: 3.31 MIL/uL — ABNORMAL LOW (ref 4.22–5.81)
RDW: 18 % — ABNORMAL HIGH (ref 11.5–15.5)
WBC: 4.1 10*3/uL (ref 4.0–10.5)
nRBC: 0 % (ref 0.0–0.2)

## 2019-08-26 LAB — LACTATE DEHYDROGENASE: LDH: 233 U/L — ABNORMAL HIGH (ref 98–192)

## 2019-08-26 LAB — PROTIME-INR
INR: 2.3 — ABNORMAL HIGH (ref 0.8–1.2)
Prothrombin Time: 24.4 seconds — ABNORMAL HIGH (ref 11.4–15.2)

## 2019-08-26 LAB — DIGOXIN LEVEL: Digoxin Level: <0.2 ng/mL — ABNORMAL LOW (ref 0.8–2.0)

## 2019-08-26 LAB — COMPREHENSIVE METABOLIC PANEL
ALT: 25 U/L (ref 0–44)
AST: 71 U/L — ABNORMAL HIGH (ref 15–41)
Albumin: 3.7 g/dL (ref 3.5–5.0)
Alkaline Phosphatase: 83 U/L (ref 38–126)
Anion gap: 12 (ref 5–15)
BUN: <5 mg/dL — ABNORMAL LOW (ref 6–20)
CO2: 27 mmol/L (ref 22–32)
Calcium: 9.1 mg/dL (ref 8.9–10.3)
Chloride: 103 mmol/L (ref 98–111)
Creatinine, Ser: 0.87 mg/dL (ref 0.61–1.24)
GFR calc Af Amer: >60 mL/min (ref >60)
GFR calc non Af Amer: >60 mL/min (ref >60)
Glucose, Bld: 88 mg/dL (ref 70–99)
Potassium: 3.4 mmol/L — ABNORMAL LOW (ref 3.5–5.1)
Sodium: 142 mmol/L (ref 135–145)
Total Bilirubin: 0.7 mg/dL (ref 0.3–1.2)
Total Protein: 7.1 g/dL (ref 6.5–8.1)

## 2019-08-26 LAB — PREALBUMIN: Prealbumin: 22.2 mg/dL (ref 18–38)

## 2019-08-26 MED ORDER — PANTOPRAZOLE SODIUM 40 MG PO TBEC: 40.0000 mg | DELAYED_RELEASE_TABLET | Freq: Every day | ORAL | 5 refills | Status: DC

## 2019-08-26 MED ORDER — DIGOXIN 125 MCG PO TABS: 0.1250 mg | ORAL_TABLET | Freq: Every day | ORAL | 5 refills | Status: DC

## 2019-08-26 NOTE — Progress Notes (Signed)
LVAD INR 

## 2019-08-26 NOTE — Progress Notes (Signed)
Patient presents for hosp follow up  in VAD Clinic today with his nephew. Reports no problems with VAD equipment or concerns with drive line.  He denies any dizziness, lightheadedness, syncope, SOB, pedal edema, abdominal swelling, orthopnea, PND, or palpitations. Pt states that he feels great.  On reviewing medications pt is not taking Coreg. We will d/c today per Dr. Gala Romney.  Vital Signs:  Doppler Pressure: 90 Automatc BP: 101/79 (83) HR: 60 SPO2: 100%  Weight: 124.8 lb w/o eqt Last weight: 127.4 lb   VAD Indication: Destination Therapy LVAD due to   LVAD assessment: HM III: Speed:  5300 rpms Flow: 3.5 Power: 3.9w    PI: 6.7 Alarms: none Events: 30-60 PI events  Fixed speed: 5500 Low speed limit: 5200  Primary Controller: Replace back up battery in 23 months Back up controller: Replace back up battery in 40months  I reviewed the LVAD parameters from today and compared the results to the patient's prior recorded data. LVAD interrogation was NEGATIVE for significant power changes, NEGATIVE for clinical alarms and STABLE for PI events/speed drops. No programming changes were made and pump is functioning within specified parameters. Pt is performing daily controller and system monitor self tests along with completing weekly and monthly maintenance for LVAD equipment.  LVAD equipment check completed and is in good working order. Back-up equipment present.   Annual Equipment Maintenance on UBC/PM was performed 2/21.  Exit Site Care: VAD dressing maintained by mother [name] weekly on Mondays. Existing VAD dressing removed and site care performed using sterile technique. Drive line exit site cleaned with Chlora prep applicators x 2, allowed to dry, and Sorbaview dressing with bio patch re-applied. Exit site well healed and incorporated.The velour is fully implanted at exit site. No redness, tenderness, drainage, or foul odor noted. Drive line anchor intact. Pt denies fever  or chills. Continue weekly dressing changes. Provided patient with 12 weekly dressing kits for home use.   Device: N/A  BP & Labs:  Doppler BP 90  - Doppler is reflecting modified systolic  Hgb 11.1 - No S/S of bleeding. Specifically denies melena/BRBPR or nosebleeds.  LDH stable at 233 with established baseline of 250 - 430. Denies tea-colored urine. No power elevations noted on interrogation.    6 month Intermacs follow up completed including:  Quality of Life, KCCQ-12, and Neurocognitive trail making (completed in 2 minutes)  Pt completed 1400 feet during 6 minute walk.  Back up controller:  11V backup battery charged during this visit.  [facility] Cardiomyopathy Questionnaire  KCCQ-12 08/26/2019  1 a. Ability to shower/bathe Not at all limited  1 b. Ability to walk 1 block Not at all limited  1 c. Ability to hurry/jog Not at all limited  3. Limited by fatigue Never over the past 2 weeks  4. Limited by dyspnea Never over the past 2 weeks  5. Sitting up / on 3+ pillows Never over the past 2 weeks  6. Limited enjoyment of life Not limited at all  7. Rest of life w/ symptoms Not at all satisfied  8 a. Participation in hobbies Did not limit at all  8 b. Participation in chores Did not limit at all  8 c. Visiting family/friends Did not limit at all      Patient Instructions: 1. No medication changes. 2. Return to clinic in 2 months   Carlton Adam RN VAD Coordinator  Office: [phone]  24/7 Pager: [phone]

## 2019-08-29 NOTE — Progress Notes (Signed)
VAD CLINIC NOTE  HPI:  [name] is a  34 y/o male from [place]  w/ h/o severe systolic HF due to NICM EF 15% and prior polysubstance abuse (cocaine and ETOH)  Underwent HM-3 LVAD placement on 02/26/19 by Dr. Morton Peters. Extubated 1/25. Required milrinone post op for RV support. Milrinone eventually weaned and Co-ox remained stable of inotropes at 61%. Later placed on sildenafil for continued RV support. Post op recovery complicated by hypoxic respiratory failure, moderate sized rt pneumothorax requiring CT placement.  Admitted 6/28-08/06/19 for AKI and hypotension in setting of volume depletion due to GI bug. Echo done and EF 40-45%   Here for post-hospital f/u VAD visit. Here with his nephew. Feels great. General:  NAD.  HEENT: normal  Neck: supple. JVP not elevated.  Carotids 2+ bilat; no bruits. No lymphadenopathy or thryomegaly appreciated. Cor: LVAD hum.  Lungs: Clear. Abdomen: soft, nontender, non-distended. No hepatosplenomegaly. No bruits or masses. Good bowel sounds. Driveline site clean. Anchor in place.  Extremities: no cyanosis, clubbing, rash. Warm no edema  Neuro: alert & oriented x 3. No focal deficits. Moves all 4 without problem    VAD Indication: Destination Therapy LVAD due to   LVAD assessment: HM III: Speed:  5300 rpms Flow: 3.5 Power: 3.9w PI: 6.7 Alarms: none Events: 30-60 PI events  Fixed speed: 5500 Low speed limit: 5200  Primary Controller: Replace back up battery in23 months Back up controller: Replace back up battery in 2months    Past Medical History:  Diagnosis Date  . Dilated cardiomyopathy (HCC)   . Dilated cardiomyopathy (HCC) 02/2019  . Polysubstance abuse (HCC)     Current Outpatient Medications  Medication Sig Dispense Refill  . aspirin 81 MG EC tablet Take 1 tablet (81 mg total) by mouth daily. Swallow whole. 30 tablet 12  . digoxin (LANOXIN) 0.125 MG tablet Take 1 tablet (0.125 mg total) by mouth daily. 30  tablet 5  . pantoprazole (PROTONIX) 40 MG tablet Take 1 tablet (40 mg total) by mouth daily. 30 tablet 5  . sildenafil (REVATIO) 20 MG tablet Take 1 tablet (20 mg total) by mouth 3 (three) times daily. 90 tablet 5  . warfarin (COUMADIN) 5 MG tablet Take 2.5 mg (1/2 tab) every Monday/Wednesday/Friday and 5 mg (1 tablet) all other days or as directed by HF Clinic (Patient taking differently: Take 5 mg by mouth See admin instructions. Take 2.5 mg (1/2 tab) every Monday/Wednesday/Friday and 5 mg (1 tablet) all other days or as directed by HF Clinic) 30 tablet 5  . enoxaparin (LOVENOX) 30 MG/0.3ML injection Inject 0.3 mLs (30 mg total) into the skin 2 (two) times daily for 5 doses. (Patient not taking: Reported on 08/26/2019) 1.5 mL 0   No current facility-administered medications for this encounter.    Patient has no known allergies.   Vitals:   08/26/19 1443 08/26/19 1444  BP: 101/79 (!) 90/0  Pulse: 60   SpO2: 100%   Weight: 56.6 kg (124 lb 12.8 oz)   Height: 5\' 8"  (1.727 m)     Vital Signs:  Doppler Pressure:90 Automatc BP: 101/79 (83) HR: 60 SPO2: 100%  Weight: 124.8 lb w/o eqt Last weight: 127.4 lb    Physical Exam: General:  NAD.  HEENT: normal  Neck: supple. JVP not elevated.  Carotids 2+ bilat; no bruits. No lymphadenopathy or thryomegaly appreciated. Cor: LVAD hum.  Lungs: Clear. Abdomen: soft, nontender, non-distended. No hepatosplenomegaly. No bruits or masses.  Good bowel sounds. Driveline site clean. Anchor in place.  Extremities: no cyanosis, clubbing, rash. Warm no edema  Neuro: alert & oriented x 3. No focal deficits. Moves all 4 without problem     ASSESSMENT AND PLAN:   1. Chronic systolic HF due to severe NICM - Echo EF 10% with biventricular failure - HM-3 VAD implant 02/26/19 - Echo 7/21 EF 40-45% - Doing very well post-hospital NYHA I - Volume status looks good. Will keep off lasix with recent AKI and volume depletion. Take only PRN - Continue  sildeanfil for RV support - Off spiro and carvedilol after recent admit. Can restart as MAPs dictate.  - Stop digoxin    2. VAD - VAD interrogated personally. Parameters stable. - DL site looks good - INR 2.3. Discussed dosing with PharmD personally.. - LDH 233  Continue ASA on top of warfarin for now. Recheck next week.  - MAPs ok - Hgb 11.1  3. Polysubstance abuse - now abstinent. - reinforced fact of not resuming beer intake. Ok to drink non-alcoholic beer if he wants Total time spent 45 minutes. Over half that time spent discussing above.   Arvilla Meres, MD  9:55 PM

## 2019-09-20 ENCOUNTER — Ambulatory Visit (HOSPITAL_COMMUNITY): Payer: Self-pay | Admitting: Pharmacist

## 2019-09-20 LAB — POCT INR: INR: 1 — AB (ref 2.0–3.0)

## 2019-09-20 MED ORDER — ENOXAPARIN SODIUM 30 MG/0.3ML ~~LOC~~ SOLN: 30.0000 mg | Freq: Two times a day (BID) | SUBCUTANEOUS | 0 refills | Status: DC

## 2019-09-20 MED ORDER — WARFARIN SODIUM 5 MG PO TABS: ORAL_TABLET | ORAL | 11 refills | Status: DC

## 2019-09-20 NOTE — Progress Notes (Signed)
LVAD INR 

## 2019-09-23 LAB — POCT INR: INR: 1.6 — AB (ref 2.0–3.0)

## 2019-09-24 ENCOUNTER — Ambulatory Visit (HOSPITAL_COMMUNITY): Payer: Self-pay | Admitting: Pharmacist

## 2019-09-24 NOTE — Progress Notes (Signed)
LVAD INR 

## 2019-10-07 ENCOUNTER — Ambulatory Visit (HOSPITAL_COMMUNITY): Payer: Self-pay | Admitting: Pharmacist

## 2019-10-07 LAB — POCT INR: INR: 2.4 (ref 2.0–3.0)

## 2019-10-07 NOTE — Progress Notes (Signed)
LVAD INR 

## 2019-10-19 ENCOUNTER — Other Ambulatory Visit (HOSPITAL_COMMUNITY): Payer: Self-pay | Admitting: *Deleted

## 2019-10-19 DIAGNOSIS — Z7901 Long term (current) use of anticoagulants: Secondary | ICD-10-CM

## 2019-10-19 DIAGNOSIS — Z95811 Presence of heart assist device: Secondary | ICD-10-CM

## 2019-10-20 ENCOUNTER — Ambulatory Visit (HOSPITAL_COMMUNITY): Payer: Self-pay | Admitting: Pharmacist

## 2019-10-20 ENCOUNTER — Ambulatory Visit (HOSPITAL_COMMUNITY)
Admission: RE | Admit: 2019-10-20 | Discharge: 2019-10-20 | Disposition: A | Discharge status: Home / Self Care | Payer: Medicaid - Out of State | Source: Ambulatory Visit | Attending: Cardiology | Admitting: Cardiology

## 2019-10-20 ENCOUNTER — Encounter (HOSPITAL_COMMUNITY): Payer: Self-pay

## 2019-10-20 ENCOUNTER — Other Ambulatory Visit: Payer: Self-pay

## 2019-10-20 VITALS — BP 102/72 | HR 70 | Wt 120.1 lb

## 2019-10-20 DIAGNOSIS — F1920 Other psychoactive substance dependence, uncomplicated: Secondary | ICD-10-CM | POA: Diagnosis not present

## 2019-10-20 DIAGNOSIS — Z634 Disappearance and death of family member: Secondary | ICD-10-CM | POA: Insufficient documentation

## 2019-10-20 DIAGNOSIS — Z79899 Other long term (current) drug therapy: Secondary | ICD-10-CM | POA: Insufficient documentation

## 2019-10-20 DIAGNOSIS — I110 Hypertensive heart disease with heart failure: Secondary | ICD-10-CM | POA: Diagnosis not present

## 2019-10-20 DIAGNOSIS — I420 Dilated cardiomyopathy: Secondary | ICD-10-CM | POA: Insufficient documentation

## 2019-10-20 DIAGNOSIS — Z7901 Long term (current) use of anticoagulants: Secondary | ICD-10-CM | POA: Insufficient documentation

## 2019-10-20 DIAGNOSIS — I5082 Biventricular heart failure: Secondary | ICD-10-CM | POA: Diagnosis not present

## 2019-10-20 DIAGNOSIS — F1011 Alcohol abuse, in remission: Secondary | ICD-10-CM | POA: Insufficient documentation

## 2019-10-20 DIAGNOSIS — Z4509 Encounter for adjustment and management of other cardiac device: Secondary | ICD-10-CM | POA: Diagnosis not present

## 2019-10-20 DIAGNOSIS — Z95811 Presence of heart assist device: Secondary | ICD-10-CM | POA: Insufficient documentation

## 2019-10-20 DIAGNOSIS — F1411 Cocaine abuse, in remission: Secondary | ICD-10-CM | POA: Diagnosis not present

## 2019-10-20 DIAGNOSIS — I5022 Chronic systolic (congestive) heart failure: Secondary | ICD-10-CM | POA: Diagnosis not present

## 2019-10-20 DIAGNOSIS — I10 Essential (primary) hypertension: Secondary | ICD-10-CM

## 2019-10-20 LAB — BASIC METABOLIC PANEL
Anion gap: 15 (ref 5–15)
BUN: <5 mg/dL — ABNORMAL LOW (ref 6–20)
CO2: 23 mmol/L (ref 22–32)
Calcium: 9.7 mg/dL (ref 8.9–10.3)
Chloride: 101 mmol/L (ref 98–111)
Creatinine, Ser: 0.71 mg/dL (ref 0.61–1.24)
GFR calc Af Amer: >60 mL/min (ref >60)
GFR calc non Af Amer: >60 mL/min (ref >60)
Glucose, Bld: 91 mg/dL (ref 70–99)
Potassium: 3.4 mmol/L — ABNORMAL LOW (ref 3.5–5.1)
Sodium: 139 mmol/L (ref 135–145)

## 2019-10-20 LAB — CBC
HCT: 45 % (ref 39.0–52.0)
Hemoglobin: 15.4 g/dL (ref 13.0–17.0)
MCH: 33.6 pg (ref 26.0–34.0)
MCHC: 34.2 g/dL (ref 30.0–36.0)
MCV: 98.3 fL (ref 80.0–100.0)
Platelets: 103 10*3/uL — ABNORMAL LOW (ref 150–400)
RBC: 4.58 MIL/uL (ref 4.22–5.81)
RDW: 13.5 % (ref 11.5–15.5)
WBC: 4.6 10*3/uL (ref 4.0–10.5)
nRBC: 0 % (ref 0.0–0.2)

## 2019-10-20 LAB — DIGOXIN LEVEL: Digoxin Level: 0.3 ng/mL — ABNORMAL LOW (ref 0.8–2.0)

## 2019-10-20 LAB — PROTIME-INR
INR: 4.2 (ref 0.8–1.2)
Prothrombin Time: 39.4 seconds — ABNORMAL HIGH (ref 11.4–15.2)

## 2019-10-20 LAB — LACTATE DEHYDROGENASE: LDH: 316 U/L — ABNORMAL HIGH (ref 98–192)

## 2019-10-20 MED ORDER — SILDENAFIL CITRATE 20 MG PO TABS: 20.0000 mg | ORAL_TABLET | Freq: Three times a day (TID) | ORAL | 5 refills | Status: DC

## 2019-10-20 MED ORDER — SODIUM CHLORIDE 0.9 % IV BOLUS
1000.0000 mL | Freq: Once | INTRAVENOUS | Status: AC
  Administered 2019-10-20: 1000 mL via INTRAVENOUS

## 2019-10-20 MED ORDER — HYDRALAZINE HCL 20 MG/ML IJ SOLN
10.0000 mg | Freq: Once | INTRAMUSCULAR | Status: AC
  Administered 2019-10-20: 10 mg via INTRAVENOUS

## 2019-10-20 NOTE — Progress Notes (Signed)
Patient presents for hosp follow up  in VAD Clinic today alone. Reports no problems with VAD equipment or concerns with drive line.  He denies any dizziness, lightheadedness, syncope, SOB, pedal edema, abdominal swelling, orthopnea, PND, or palpitations. Reports that his mother passed away on 05-24-22 after receiving COVID vaccine.   Frequent low flows noted on interrogation and while he is sitting in clinic. Reports he is drinking >2 liters per day. BP elevated today (see below.) Reports he has not taken his Sildenafil in "several days" because he has been dealing with his mother's passing. He also reports the last time he picked up a refill it was "over $100." Provided patient with GoodRx card and sent in prescription to [facility] [phone]) in [place].   Will stop Digoxin and Spironolactone. Start Losartan 50 mg daily per Dr Gala Romney.     Vital Signs:  Doppler Pressure: 110 Automatc BP: 146/86 (105) HR: 70  (EKG done to confirm) SPO2: 98%  Weight: 120.1 lb w/o eqt Last weight: 124.8 lb   VAD Indication: Destination Therapy LVAD due to   LVAD assessment: HM III: Speed:  5300 rpms                                Speed decreased to 5200 per Dr Gala Romney due to low flows Flow: 2.8        2.8 Power: 3.9w                                            3.8  PI: 10.4                                                    11.8 Alarms: frequent low flows Events: 30-60 PI events  Fixed speed: 5200 Low speed limit: 4900  Primary Controller: Replace back up battery in 19 months Back up controller: Replace back up battery in 77months  I reviewed the LVAD parameters from today and compared the results to the patient's prior recorded data. LVAD interrogation was NEGATIVE for significant power changes, NEGATIVE for clinical alarms and STABLE for PI events/speed drops. No programming changes were made and pump is functioning within specified parameters. Pt is performing daily controller  and system monitor self tests along with completing weekly and monthly maintenance for LVAD equipment.  LVAD equipment check completed and is in good working order. Back-up equipment present.   Annual Equipment Maintenance on UBC/PM was performed 2/21.  Exit Site Care: VAD dressing maintained by patient weekly. Existing VAD dressing removed and site care performed using sterile technique. Drive line exit site cleaned with Chlora prep applicators x 2, allowed to dry, and Sorbaview dressing with bio patch re-applied. Exit site well healed and incorporated.The velour is fully implanted at exit site. Small amount of bloody drainage noted; pt reports he jumped out of bed and drive line "got caught on the side of the bed." No redness, tenderness, or foul odor noted. Drive line anchor intact. Pt denies fever or chills. Continue weekly dressing changes. Provided patient with 8 weekly dressing kits and 6 anchors for home use.   Device: N/A  BP & Labs:  Doppler BP  110  - Doppler is reflecting MAP  Hgb 15.4- No S/S of bleeding. Specifically denies melena/BRBPR or nosebleeds.  LDH stable at 316 with established baseline of 250 - 430. Denies tea-colored urine. No power elevations noted on interrogation.    Right forearm PIV placed- 1 liter normal saline and Hydralazine 10 mg IV push administered per Dr Gala Romney. PIV discontinued prior to discharge from clinic. BP 102/72 (86) prior to discharge. Speed: 5200 / Flow: 3.1/ Power: 3.8 / PI 7.8  Patient Instructions: 1. Stop Digoxin and Spironolactone  2. Start Losartan 50 mg daily for blood pressure control. [facility] 3. Pick up Sildenafil from [facility] using GoodRx coupon  3. Coumadin dosing per Leotis Shames PharmD- Hold Warfarin tonight. Tomorrow (Thursday) take 2.5 mg (1/2 tablet) then continue taking 2.5 mg (1/2 tablet) on Monday Wednesday and Fridays, and 5 mg (whole tablet) all other days  4. Return to VAD clinic in 1 week for  follow up appointment   Alyce Pagan RN VAD Coordinator  Office: [phone]  24/7 Pager: [phone]

## 2019-10-20 NOTE — Progress Notes (Signed)
LVAD INR 

## 2019-10-20 NOTE — Patient Instructions (Addendum)
1. Stop Digoxin and Spironolactone  2. Start Losartan 50 mg daily for blood pressure control. [facility] 3. Pick up Sildenafil from [facility] using GoodRx coupon  3. Coumadin dosing per Leotis Shames PharmD- Hold Warfarin tonight. Tomorrow (Thursday) take 2.5 mg (1/2 tablet) then continue taking 2.5 mg (1/2 tablet) on Monday Wednesday and Fridays, and 5 mg (whole tablet) all other days  4. Return to VAD clinic in 1 week for follow up appointment

## 2019-10-20 NOTE — Progress Notes (Signed)
VAD CLINIC NOTE  HPI:  [name] is a 34 y/o male from [place]  w/ h/o severe systolic HF due to NICM EF 15% and prior polysubstance abuse (cocaine and ETOH)  Underwent HM-3 LVAD placement on 02/26/19 by Dr. Morton Peters. Extubated 1/25. Required milrinone post op for RV support. Milrinone eventually weaned and Co-ox remained stable of inotropes at 61%. Later placed on sildenafil for continued RV support. Post op recovery complicated by hypoxic respiratory failure, moderate sized rt pneumothorax requiring CT placement.  Admitted 6/28-08/06/19 for AKI and hypotension in setting of volume depletion due to GI bug.Had multiple flow alarms. Echo EF 40-45%   Here for unscheduled VAD visit. Having frequent low flow alarms since yesterday. His mother died suddenly several weeks ago due to possible COVID. He is living at home with his older brother and nephew. Says he is doing ok. Eating a lot of fast food and microwave food but feels he is getting enough to eat. Weight down 4 pounds. Says he has not taken his sildenafil in a few days after his mother's passing. Denies orthopnea or PND. No fevers, chills or problems with driveline. No bleeding, melena or neuro symptoms. No VAD alarms. Taking all meds as prescribed.    Vital Signs:  Doppler Pressure:110 Automatc BP: 146/86 (105) HR: 70  (EKG done to confirm) SPO2: 98%  Weight: 120.1 lb w/o eqt Last weight: 124.8 lb   VAD Indication: Destination Therapy LVAD due to   LVAD assessment: HM III: Speed:  5300 rpms                                Speed decreased to 5200 per Dr Gala Romney due to low flows Flow: 2.8                                                 2.8 Power: 3.9w                                          3.8 PI: 10.4                                                    11.8 Alarms: frequent low flows Events: 30-60 PI events  Fixed speed: 5200 Low speed limit: 4900  Primary Controller: Replace back up battery in19  months Back up controller: Replace back up battery in 49months    Past Medical History:  Diagnosis Date  . Dilated cardiomyopathy (HCC)   . Dilated cardiomyopathy (HCC) 02/2019  . Polysubstance abuse (HCC)     Current Outpatient Medications  Medication Sig Dispense Refill  . metoprolol tartrate (LOPRESSOR) 25 MG tablet Take 12.5 mg by mouth daily.    . pantoprazole (PROTONIX) 40 MG tablet Take 1 tablet (40 mg total) by mouth daily. 30 tablet 5  . sildenafil (REVATIO) 20 MG tablet Take 1 tablet (20 mg total) by mouth 3 (three) times daily. 90 tablet 5  . warfarin (COUMADIN) 5 MG tablet Take 2.5 mg (1/2  tab) every Monday/Wednesday/Friday and 5 mg (1 tablet) all other days or as directed by HF Clinic 60 tablet 11  . aspirin EC 81 MG tablet Take 81 mg by mouth daily. Reports that he is not taking (Patient not taking: Reported on 10/20/2019)    . enoxaparin (LOVENOX) 30 MG/0.3ML injection Inject 0.3 mLs (30 mg total) into the skin 2 (two) times daily. (Patient not taking: Reported on 10/20/2019) 3 mL 0   No current facility-administered medications for this encounter.    Patient has no known allergies.   Vitals:   10/20/19 1124 10/20/19 1240 10/20/19 1300 10/20/19 1325  BP: (!) 146/86 114/69 (!) 106/94 102/72  Pulse:      SpO2:      Weight:        Vital Signs:  Doppler Pressure:90 Automatc BP: 101/79 (83) HR: 60 SPO2: 100%  Weight: 124.8 lb w/o eqt Last weight: 127.4 lb    Physical Exam: General:  NAD.  HEENT: normal  Neck: supple. JVP not elevated.  Carotids 2+ bilat; no bruits. No lymphadenopathy or thryomegaly appreciated. Cor: LVAD hum.  Lungs: Clear. Abdomen:  soft, nontender, non-distended. No hepatosplenomegaly. No bruits or masses. Good bowel sounds. Driveline site clean. Anchor in place.  Extremities: no cyanosis, clubbing, rash. Warm no edema  Neuro: alert & oriented x 3. No focal deficits. Moves all 4 without problem    ASSESSMENT AND PLAN:   1.  Chronic systolic HF due to severe NICM - Echo EF 10% with biventricular failure - HM-3 VAD implant 02/26/19 - Echo 7/21 EF 40-45% - Feels ok. NYHA I but now having frequent low flow alarms in setting of probable volume depletion (in setting of reduced po intake) and HTN - VAD speed turned down to 5200 and given 1L IVF in clinic as well as hydralazine 10 mg  - Will restart sildenafil and also start losartan 50 daily - Encouraged him to increase fluid intake. Hold spiro for now - I worry that his condition may suffer without his mother's help    2. VAD - VAD interrogated personally. Having multiple low flows. Unsure if related to HTN or volume depletion. Plan as above. See back next week. - DL site looks good - INR 4.2. Discussed dosing with PharmD personally. - LDH 316  Continue ASA on top of warfarin for now. Recheck next week.  - MAPs high - Hgb 15.4   3. HTN - BP up. Start losartan.   4. Polysubstance abuse - now abstinent. - reinforced fact of not resuming beer intake. Ok to drink non-alcoholic beer if he wants   Total time spent 60 minutes. Over half that time spent discussing above.   Arvilla Meres, MD  5:05 PM

## 2019-10-22 NOTE — Addendum Note (Signed)
Encounter addended by: Marcy Siren, LCSW on: 10/22/2019 1:15 PM  Actions taken: Clinical Note Signed

## 2019-10-22 NOTE — Progress Notes (Signed)
CSW met with patient in the clinic. Patient shared that his mother who was his primary caregiver passed away on 04-28-2022. Patient shared that she got sick and was hospitalized and unfortunately did not make it. Patient states that he will continue to live with his older brother and nephew in their family home. CSW offered support and encouraged patient to let CSW know if there are any needs. Patient verbalizes understanding and will call if needed. [name], [place], [place]

## 2019-10-27 ENCOUNTER — Other Ambulatory Visit (HOSPITAL_COMMUNITY): Payer: Self-pay | Admitting: *Deleted

## 2019-10-27 DIAGNOSIS — Z7901 Long term (current) use of anticoagulants: Secondary | ICD-10-CM

## 2019-10-27 DIAGNOSIS — Z95811 Presence of heart assist device: Secondary | ICD-10-CM

## 2019-10-29 ENCOUNTER — Encounter (HOSPITAL_COMMUNITY): Payer: 59

## 2019-11-01 ENCOUNTER — Encounter (HOSPITAL_COMMUNITY): Payer: 59

## 2019-11-05 LAB — POCT INR: INR: 4.1 — AB (ref 2.0–3.0)

## 2019-11-08 ENCOUNTER — Ambulatory Visit (HOSPITAL_COMMUNITY): Payer: Self-pay | Admitting: Pharmacist

## 2019-11-08 MED ORDER — WARFARIN SODIUM 5 MG PO TABS: ORAL_TABLET | ORAL | 11 refills | Status: DC

## 2019-11-08 NOTE — Progress Notes (Signed)
LVAD INR 

## 2019-11-11 ENCOUNTER — Encounter (HOSPITAL_COMMUNITY): Payer: 59

## 2019-11-11 ENCOUNTER — Telehealth (HOSPITAL_COMMUNITY): Payer: Self-pay | Admitting: Unknown Physician Specialty

## 2019-11-11 ENCOUNTER — Encounter (HOSPITAL_COMMUNITY): Payer: Self-pay | Admitting: Unknown Physician Specialty

## 2019-11-11 NOTE — Telephone Encounter (Signed)
Pt no showed at his appt today  In VAD clinic. Pt states that his "ride did not show up". Pt rescheduled. VAD coordinator called the number that the pt utilizes for his transportation, spoke with Victorino Dike. [phone]) Apparently the pt never called to schedule his ride. Victorino Dike called transportation agency personally and scheduled pts ride for 10/15. Pt informed that his transportation will be at this house next Friday at 0920. Pt verbalized understanding.  Carlton Adam RN, BSN VAD Coordinator 24/7 Pager [phone]

## 2019-11-19 ENCOUNTER — Other Ambulatory Visit: Payer: Self-pay

## 2019-11-19 ENCOUNTER — Ambulatory Visit (HOSPITAL_COMMUNITY): Payer: Self-pay | Admitting: Pharmacist

## 2019-11-19 ENCOUNTER — Telehealth (HOSPITAL_COMMUNITY): Payer: Self-pay | Admitting: Licensed Clinical Social Worker

## 2019-11-19 ENCOUNTER — Ambulatory Visit (HOSPITAL_COMMUNITY)
Admission: RE | Admit: 2019-11-19 | Discharge: 2019-11-19 | Disposition: A | Discharge status: Home / Self Care | Payer: Medicaid - Out of State | Source: Ambulatory Visit | Attending: Internal Medicine | Admitting: Internal Medicine

## 2019-11-19 VITALS — BP 100/0 | HR 95 | Ht 68.0 in | Wt 117.2 lb

## 2019-11-19 DIAGNOSIS — I5082 Biventricular heart failure: Secondary | ICD-10-CM | POA: Insufficient documentation

## 2019-11-19 DIAGNOSIS — F1411 Cocaine abuse, in remission: Secondary | ICD-10-CM | POA: Diagnosis not present

## 2019-11-19 DIAGNOSIS — Z79899 Other long term (current) drug therapy: Secondary | ICD-10-CM | POA: Diagnosis not present

## 2019-11-19 DIAGNOSIS — I110 Hypertensive heart disease with heart failure: Secondary | ICD-10-CM | POA: Diagnosis not present

## 2019-11-19 DIAGNOSIS — I10 Essential (primary) hypertension: Secondary | ICD-10-CM

## 2019-11-19 DIAGNOSIS — I420 Dilated cardiomyopathy: Secondary | ICD-10-CM | POA: Insufficient documentation

## 2019-11-19 DIAGNOSIS — Z7901 Long term (current) use of anticoagulants: Secondary | ICD-10-CM | POA: Insufficient documentation

## 2019-11-19 DIAGNOSIS — I5022 Chronic systolic (congestive) heart failure: Secondary | ICD-10-CM | POA: Diagnosis not present

## 2019-11-19 DIAGNOSIS — F1010 Alcohol abuse, uncomplicated: Secondary | ICD-10-CM

## 2019-11-19 DIAGNOSIS — E876 Hypokalemia: Secondary | ICD-10-CM | POA: Insufficient documentation

## 2019-11-19 DIAGNOSIS — Z95811 Presence of heart assist device: Secondary | ICD-10-CM | POA: Diagnosis not present

## 2019-11-19 LAB — PROTIME-INR
INR: 1.8 — ABNORMAL HIGH (ref 0.8–1.2)
Prothrombin Time: 19.9 seconds — ABNORMAL HIGH (ref 11.4–15.2)

## 2019-11-19 LAB — BASIC METABOLIC PANEL
Anion gap: 16 — ABNORMAL HIGH (ref 5–15)
BUN: <5 mg/dL — ABNORMAL LOW (ref 6–20)
CO2: 28 mmol/L (ref 22–32)
Calcium: 9.8 mg/dL (ref 8.9–10.3)
Chloride: 88 mmol/L — ABNORMAL LOW (ref 98–111)
Creatinine, Ser: 0.97 mg/dL (ref 0.61–1.24)
GFR, Estimated: >60 mL/min (ref >60)
Glucose, Bld: 103 mg/dL — ABNORMAL HIGH (ref 70–99)
Potassium: 2.6 mmol/L — CL (ref 3.5–5.1)
Sodium: 132 mmol/L — ABNORMAL LOW (ref 135–145)

## 2019-11-19 LAB — CBC
HCT: 39.3 % (ref 39.0–52.0)
Hemoglobin: 13.2 g/dL (ref 13.0–17.0)
MCH: 32.5 pg (ref 26.0–34.0)
MCHC: 33.6 g/dL (ref 30.0–36.0)
MCV: 96.8 fL (ref 80.0–100.0)
Platelets: 183 10*3/uL (ref 150–400)
RBC: 4.06 MIL/uL — ABNORMAL LOW (ref 4.22–5.81)
RDW: 13.6 % (ref 11.5–15.5)
WBC: 6.1 10*3/uL (ref 4.0–10.5)
nRBC: 0 % (ref 0.0–0.2)

## 2019-11-19 LAB — LACTATE DEHYDROGENASE: LDH: 185 U/L (ref 98–192)

## 2019-11-19 MED ORDER — PANTOPRAZOLE SODIUM 40 MG PO TBEC: 40.0000 mg | DELAYED_RELEASE_TABLET | Freq: Every day | ORAL | 11 refills | Status: DC

## 2019-11-19 MED ORDER — POTASSIUM CHLORIDE CRYS ER 20 MEQ PO TBCR
40.0000 meq | EXTENDED_RELEASE_TABLET | Freq: Every day | ORAL | 3 refills | Status: DC
Start: 2019-11-19 — End: 2019-11-23

## 2019-11-19 NOTE — Progress Notes (Signed)
VAD CLINIC NOTE  HPI:  [name] is a 34 y/o male from [place]  w/ h/o severe systolic HF due to NICM EF 15% and prior polysubstance abuse (cocaine and ETOH)  Underwent HM-3 LVAD placement on 02/26/19 by Dr. Morton Peters. Extubated 1/25. Required milrinone post op for RV support. Milrinone eventually weaned and Co-ox remained stable of inotropes at 61%. Later placed on sildenafil for continued RV support. Post op recovery complicated by hypoxic respiratory failure, moderate sized rt pneumothorax requiring CT placement.  Admitted 6/28-08/06/19 for AKI and hypotension in setting of volume depletion due to GI bug.Had multiple flow alarms. Echo EF 40-45%   Here for routine VAD f/u. At last visit was having frequent low flow alarms after his mother died. Suspected poor po intake. Speed turned down to 5200 and digoxina nd spiro stopped. Now feeling much better. Says he is eating well but weight still down 3 pounds. Denies orthopnea or PND. No fevers, chills or problems with driveline. No bleeding, melena or neuro symptoms. No VAD alarms. Taking all meds as prescribed.     since yesterday. His mother died suddenly several weeks ago due to possible COVID. He is living at home with his older brother and nephew. Says he is doing ok. Eating a lot of fast food and microwave food but feels he is getting enough to eat. Weight down 4 pounds. Says he has not taken his sildenafil in a few days after his mother's passing. Denies orthopnea or PND. No fevers, chills or problems with driveline. No bleeding, melena or neuro symptoms. No VAD alarms. Taking all meds as prescribed. Wants to return to work PT.  VAD Indication: Destination Therapy LVAD due to   LVAD assessment: HM III: Speed:  5200 Flow: 3.0                                                  Power: 3.9w                                           PI: 10.6                                                    Alarms: 1 LF 10/15,  10/13 and  10/4 Events: 30-60 PI events  Fixed speed: 5200 Low speed limit: 4900  Primary Controller: Replace back up battery in18 months Back up controller: Replace back up battery in 76months    Past Medical History:  Diagnosis Date  . Dilated cardiomyopathy (HCC)   . Dilated cardiomyopathy (HCC) 02/2019  . Polysubstance abuse (HCC)     Current Outpatient Medications  Medication Sig Dispense Refill  . metoprolol tartrate (LOPRESSOR) 25 MG tablet Take 12.5 mg by mouth daily.    . sildenafil (REVATIO) 20 MG tablet Take 1 tablet (20 mg total) by mouth 3 (three) times daily. 90 tablet 5  . warfarin (COUMADIN) 5 MG tablet Take 5 mg (1 tab) every Tues/Thurs/Sat and 2.5 mg (0.5 tablet) all other days or as directed by HF Clinic 60 tablet  11  . enoxaparin (LOVENOX) 30 MG/0.3ML injection Inject 0.3 mLs (30 mg total) into the skin 2 (two) times daily. (Patient not taking: Reported on 10/20/2019) 3 mL 0  . pantoprazole (PROTONIX) 40 MG tablet Take 1 tablet (40 mg total) by mouth daily. 30 tablet 11  . potassium chloride SA (KLOR-CON M20) 20 MEQ tablet Take 2 tablets (40 mEq total) by mouth daily. Pt needs to take 80 twice today only for a potassium of 2.6 90 tablet 3   No current facility-administered medications for this encounter.    Patient has no known allergies.   Vitals:   11/19/19 1207 11/19/19 1208  BP: 106/89 (!) 100/0  Pulse: 95   SpO2: 100%   Weight: 53.2 kg (117 lb 3.2 oz)   Height: 5\' 8"  (1.727 m)      Vital Signs:  Doppler Pressure:100 Automatc BP: 106/89 (95) HR: 95 SPO2: 100%  Weight: 117.2 lb w/o eqt Last weight: 120.1 lb   Physical Exam: General:  NAD.  HEENT: normal  Neck: supple. JVP not elevated.  Carotids 2+ bilat; no bruits. No lymphadenopathy or thryomegaly appreciated. Cor: LVAD hum.  Lungs: Clear. Abdomen: soft, nontender, non-distended. No hepatosplenomegaly. No bruits or masses. Good bowel sounds. Driveline site clean. Anchor in place.   Extremities: no cyanosis, clubbing, rash. Warm no edema  Neuro: alert & oriented x 3. No focal deficits. Moves all 4 without problem     ASSESSMENT AND PLAN:   1. Chronic systolic HF due to severe NICM - Echo EF 10% with biventricular failure - HM-3 VAD implant 02/26/19 - Echo 7/21 EF 40-45% - He is improved. NYHA I. Low flows essentially resolved - Continue VAD speed at 5200 - Will have him restart sildenafil - At last visit wrote for losartan 50 for HTN but he did not fill. MAPs not too bad today. Will attempt to keep med regimen as simple as possible.  - Encouraged him to keep po intake up    2. VAD - VAD interrogated personally. Parameters stable. Low flows much improved.  - DL site looks good - INR 1.8. Discussed dosing with PharmD personally. - LDH 185  Continue ASA on top of warfarin for now as LDH recently up > 300 - Hgb 13.2  3. HTN - At last visit wrote for losartan 50 for HTN but he did not fill. MAPs not too bad today. Will attempt to keep med regimen as simple as possible.   4. Polysubstance abuse - now abstinent.  5. Hypokalemia - will supp. Check BMET every week x 4 weeks.   8/21, MD  1:10 PM

## 2019-11-19 NOTE — Progress Notes (Signed)
Patient presents for 1 month f/u  in VAD Clinic today alone. Reports no problems with VAD equipment or concerns with drive line.  He denies any dizziness, lightheadedness, syncope, SOB, pedal edema, abdominal swelling, orthopnea, PND, or palpitations.  Reports he has only been taking his Sildenafil twice a day  because he wasn't sure how he would pay for when he ran out. Provided patient with GoodRx card and sent in prescription to [facility] [phone]) in [place]. Pt assures me he will pick it up today.  Stopped Digoxin and Spironolactone at last visit. According to the note pt was supposed to Start Losartan 50 mg daily but the prescription was never sent and med is not on pts med list, pt states he was unaware. Pt states that he has not taken protonix in 2 weeks and denies any symptoms of heart burn or acid reflux. Pt instructed to restart Protonix since he is taking Warfarin per Dr[facility]  Pt is asking about returning to work a few hours a week. Dr Gala Romney ok with pt doing light work. Annice Pih into see pt about how many hours etc.   Vital Signs:  Doppler Pressure: 100 Automatc BP: 106/89 (95) HR: 95 SPO2: 100%  Weight: 117.2 lb w/o eqt Last weight: 120.1 lb   VAD Indication: Destination Therapy LVAD due to   LVAD assessment: HM III: Speed:  5200 Flow: 3.0         Power: 3.9w                                             PI: 10.6                                                    Alarms: 1 LF 10/15,  10/13 and 10/4 Events: 30-60 PI events  Fixed speed: 5200 Low speed limit: 4900  Primary Controller: Replace back up battery in 18 months Back up controller: Replace back up battery in 42months  I reviewed the LVAD parameters from today and compared the results to the patient's prior recorded data. LVAD interrogation was NEGATIVE for significant power changes, POSITIVE for clinical alarms and STABLE for PI events/speed drops. No programming  changes were made and pump is functioning within specified parameters. Pt is performing daily controller and system monitor self tests along with completing weekly and monthly maintenance for LVAD equipment.  LVAD equipment check completed and is in good working order. Back-up equipment present.   Annual Equipment Maintenance on UBC/PM was performed 2/21.  Exit Site Care: VAD dressing maintained by patient weekly. Existing VAD dressing removed and site care performed using sterile technique. Drive line exit site cleaned with Chlora prep applicators x 2, allowed to dry, and Sorbaview dressing with bio patch re-applied. Exit site well healed and incorporated.The velour is fully implanted at exit site. No redness, tenderness, or foul odor noted. Excoriated area under anchor-bleeding. Educated pt on rotating this site. Drive line anchor replaced. Pt denies fever or chills. Continue weekly dressing changes. Provided patient with 8 weekly dressing kits and 10 anchors for home use.   Device: N/A  BP & Labs:  Doppler BP 100  - Doppler is reflecting modified systolic  Hgb 13.2- No S/S of bleeding. Specifically denies melena/BRBPR or nosebleeds.  LDH stable at 316 with established baseline of 250 - 430. Denies tea-colored urine. No power elevations noted on interrogation.     Patient Instructions: 1. Restart [facility] 2. Increase dietary intake 3. Please take 4 potassium pills this afternoon and 4 pills before bed and then resume a dose of 2 pills daily. 4. Return to clinic in 2 months   Carlton Adam RN VAD Coordinator  Office: [phone]  24/7 Pager: [phone]

## 2019-11-19 NOTE — Patient Instructions (Signed)
1. Restart [facility]. 2. Increase dietary intake 3. Return to clinic in 2 months

## 2019-11-19 NOTE — Progress Notes (Signed)
LVAD INR 

## 2019-11-19 NOTE — Telephone Encounter (Signed)
CSW received referral to assist patient with food resources. Patient resides at home with his brother and just recently lost his mother to covid. CSW contacted [facility] and spoke with Michaelle Copas [phone] who states she can assist patient with a return call on Monday. The food bank will open on Monday morning at 10am. CSW informed patient of follow up needed. Patient verbalizes understanding and also confirmed understanding of follow up needed with Potassium instructed by VAD Coordinator. CSW available as needed. Lasandra Beech, LCSW, CCSW-MCS [phone]

## 2019-11-23 ENCOUNTER — Other Ambulatory Visit (HOSPITAL_COMMUNITY): Payer: Self-pay | Admitting: Unknown Physician Specialty

## 2019-11-23 ENCOUNTER — Other Ambulatory Visit: Payer: Self-pay | Admitting: Unknown Physician Specialty

## 2019-11-23 MED ORDER — POTASSIUM CHLORIDE CRYS ER 20 MEQ PO TBCR: 40.0000 meq | EXTENDED_RELEASE_TABLET | Freq: Every day | ORAL | 3 refills | Status: DC

## 2019-11-29 ENCOUNTER — Other Ambulatory Visit: Payer: Self-pay | Admitting: Internal Medicine

## 2019-11-30 ENCOUNTER — Ambulatory Visit (HOSPITAL_COMMUNITY): Payer: Self-pay | Admitting: Pharmacist

## 2019-11-30 LAB — PROTIME-INR
INR: 1.1 (ref 0.9–1.2)
Prothrombin Time: 11.8 s (ref 9.1–12.0)

## 2019-11-30 LAB — POCT INR: INR: 1.1 — AB (ref 2.0–3.0)

## 2019-11-30 MED ORDER — WARFARIN SODIUM 5 MG PO TABS: ORAL_TABLET | ORAL | 11 refills | Status: DC

## 2019-11-30 MED ORDER — ENOXAPARIN SODIUM 30 MG/0.3ML ~~LOC~~ SOLN: 30.0000 mg | Freq: Two times a day (BID) | SUBCUTANEOUS | 0 refills | Status: DC

## 2019-11-30 NOTE — Progress Notes (Signed)
LVAD INR 

## 2019-12-01 ENCOUNTER — Telehealth (HOSPITAL_COMMUNITY): Payer: Self-pay | Admitting: Licensed Clinical Social Worker

## 2019-12-01 NOTE — Progress Notes (Signed)
CSW contacted patient to follow on request to get added services in the home. Patient's mom who was his primary caregiver passed away recently. She managed all the medications and dressing changes. CSW discussed with patient about a possible home care agency to assist with medication pre fills and patient was agreeable. CSW spoke with [facility] in [place] and awaiting a return call. Patient states he is currently managing his own dressing changes and assured CSW that he is using sterile technique to avoid any infections. Patient reports he has food at the moment and will follow up on resources provided by CSW if he runs out of food. CSW will await return call from Home Care agency and follow up with VAD Coordinators. Lasandra Beech, LCSW, CCSW-MCS [phone]

## 2019-12-02 ENCOUNTER — Telehealth (HOSPITAL_COMMUNITY): Payer: Self-pay | Admitting: Licensed Clinical Social Worker

## 2019-12-02 NOTE — Telephone Encounter (Signed)
CSW received return call from [facility] in [place], [place]. Stating they will explore the possibility of taking referral although state they are short staffed. CSW informed patient and also followed up on food resources if needed. Patient's mother who passed away provided all the family cooking. Patient reports they are ok for now but anticipate the need shortly. CSW continues to follow. Lasandra Beech, LCSW, CCSW-MCS [phone]

## 2019-12-06 ENCOUNTER — Other Ambulatory Visit: Payer: Self-pay | Admitting: Internal Medicine

## 2019-12-06 ENCOUNTER — Telehealth (HOSPITAL_COMMUNITY): Payer: Self-pay | Admitting: Licensed Clinical Social Worker

## 2019-12-06 NOTE — Progress Notes (Signed)
CSW received call from patient stating that he is short on his [employer]. He states that his brother was able to make a significant payment although they are short $147. CSW assisted with support from the Patient Care Fund to bridge the difference and making payment in full. Patient reports he is doing well and denies any other concerns. CSW continues to explore home care options with no success.  Patient very grateful for the support and assistance. CSW continues to follow through the VAD clinic. Lasandra Beech, LCSW, CCSW-MCS [phone]

## 2019-12-07 NOTE — Addendum Note (Signed)
Encounter addended by: Noralee Space, RN on: 12/07/2019 9:59 AM  Actions taken: Order list changed, Diagnosis association updated

## 2019-12-08 ENCOUNTER — Telehealth (HOSPITAL_COMMUNITY): Payer: Self-pay | Admitting: Licensed Clinical Social Worker

## 2019-12-08 LAB — PROTIME-INR
INR: 2.6 — ABNORMAL HIGH (ref 0.9–1.2)
Prothrombin Time: 25.8 s — ABNORMAL HIGH (ref 9.1–12.0)

## 2019-12-08 NOTE — Telephone Encounter (Signed)
CSW and VAD Coordinator sent home care referral to [facility] for medication management and collaboration with VAD team on patient care. CSW informed that referral was denied due to insurance is out of network. VAD Coordinator aware and pursuing other home care agencies for options. CSW continues to follow for needs. Lasandra Beech, LCSW, CCSW-MCS [phone]

## 2019-12-09 ENCOUNTER — Telehealth (HOSPITAL_COMMUNITY): Payer: Self-pay | Admitting: *Deleted

## 2019-12-09 ENCOUNTER — Ambulatory Visit (HOSPITAL_COMMUNITY): Payer: Self-pay | Admitting: Pharmacist

## 2019-12-09 NOTE — Progress Notes (Signed)
LVAD INR 

## 2019-12-09 NOTE — Telephone Encounter (Signed)
Referral packet sent to [facility] requesting [facility] for medication management. Representative states that they accept his insurance, and will reach out to VAD coordinators once his case is reviewed.   Phone: [phone] Fax: [phone]  Alyce Pagan RN VAD Coordinator  Office: [phone]  24/7 Pager: [phone]

## 2019-12-10 ENCOUNTER — Telehealth (HOSPITAL_COMMUNITY): Payer: Self-pay | Admitting: *Deleted

## 2019-12-10 NOTE — Telephone Encounter (Signed)
Message from [facility] this am in VAD office. She reports she received patient packet requesting HH services on this patient. She did not realize patient had LVAD and will have to discuss with her supervisor later today, but will probably be a "no" due to LVAD.  Called and left Leah message re: Jon Martin. Informed her we would not need them to assist with LVAD, but just need assistance with medication management. Asked that she call back with questions, decision.  [name] RN, VAD Coordinator [phone]

## 2019-12-14 ENCOUNTER — Telehealth (HOSPITAL_COMMUNITY): Payer: Self-pay | Admitting: *Deleted

## 2019-12-14 NOTE — Telephone Encounter (Signed)
Message from [place] at [facility] stating her supervisor made decision not to accept patient due to his having an LVAD. She was aware the nurses would not be responsible for LVAD equipment or site care, but felt it was a liability to have her nurses in the home "in case something happened".  [name] RN, VAD Coordinator [phone]

## 2019-12-27 ENCOUNTER — Other Ambulatory Visit: Payer: Self-pay | Admitting: Internal Medicine

## 2019-12-29 ENCOUNTER — Ambulatory Visit (HOSPITAL_COMMUNITY): Payer: Self-pay | Admitting: Pharmacist

## 2019-12-29 LAB — POCT INR: INR: 2.3 (ref 2.0–3.0)

## 2019-12-29 LAB — PROTIME-INR
INR: 2.3 — ABNORMAL HIGH (ref 0.9–1.2)
Prothrombin Time: 23.7 s — ABNORMAL HIGH (ref 9.1–12.0)

## 2019-12-29 NOTE — Progress Notes (Signed)
LVAD INR 

## 2020-01-12 ENCOUNTER — Inpatient Hospital Stay (HOSPITAL_COMMUNITY)
Admission: AD | Admit: 2020-01-12 | Discharge: 2020-01-21 | DRG: 315 | Disposition: A | Discharge status: Home / Self Care | Payer: Medicaid - Out of State | Source: Other Acute Inpatient Hospital | Attending: Internal Medicine | Admitting: Internal Medicine

## 2020-01-12 DIAGNOSIS — Z20822 Contact with and (suspected) exposure to covid-19: Secondary | ICD-10-CM | POA: Diagnosis present

## 2020-01-12 DIAGNOSIS — Y929 Unspecified place or not applicable: Secondary | ICD-10-CM

## 2020-01-12 DIAGNOSIS — T829XXA Unspecified complication of cardiac and vascular prosthetic device, implant and graft, initial encounter: Secondary | ICD-10-CM | POA: Diagnosis present

## 2020-01-12 DIAGNOSIS — I5082 Biventricular heart failure: Secondary | ICD-10-CM | POA: Diagnosis present

## 2020-01-12 DIAGNOSIS — T829XXD Unspecified complication of cardiac and vascular prosthetic device, implant and graft, subsequent encounter: Secondary | ICD-10-CM | POA: Diagnosis not present

## 2020-01-12 DIAGNOSIS — I509 Heart failure, unspecified: Secondary | ICD-10-CM

## 2020-01-12 DIAGNOSIS — T82598A Other mechanical complication of other cardiac and vascular devices and implants, initial encounter: Secondary | ICD-10-CM | POA: Diagnosis present

## 2020-01-12 DIAGNOSIS — R7402 Elevation of levels of lactic acid dehydrogenase (LDH): Secondary | ICD-10-CM | POA: Diagnosis not present

## 2020-01-12 DIAGNOSIS — Z7901 Long term (current) use of anticoagulants: Secondary | ICD-10-CM

## 2020-01-12 DIAGNOSIS — R042 Hemoptysis: Secondary | ICD-10-CM | POA: Diagnosis not present

## 2020-01-12 DIAGNOSIS — R791 Abnormal coagulation profile: Secondary | ICD-10-CM | POA: Diagnosis present

## 2020-01-12 DIAGNOSIS — D62 Acute posthemorrhagic anemia: Secondary | ICD-10-CM | POA: Diagnosis present

## 2020-01-12 DIAGNOSIS — E876 Hypokalemia: Secondary | ICD-10-CM | POA: Diagnosis present

## 2020-01-12 DIAGNOSIS — F1410 Cocaine abuse, uncomplicated: Secondary | ICD-10-CM | POA: Diagnosis present

## 2020-01-12 DIAGNOSIS — D696 Thrombocytopenia, unspecified: Secondary | ICD-10-CM | POA: Diagnosis present

## 2020-01-12 DIAGNOSIS — Y718 Miscellaneous cardiovascular devices associated with adverse incidents, not elsewhere classified: Secondary | ICD-10-CM | POA: Diagnosis present

## 2020-01-12 DIAGNOSIS — Z95811 Presence of heart assist device: Secondary | ICD-10-CM

## 2020-01-12 DIAGNOSIS — I5022 Chronic systolic (congestive) heart failure: Secondary | ICD-10-CM | POA: Diagnosis present

## 2020-01-12 DIAGNOSIS — D649 Anemia, unspecified: Secondary | ICD-10-CM | POA: Diagnosis not present

## 2020-01-12 LAB — COMPREHENSIVE METABOLIC PANEL WITH GFR
ALT: 24 U/L (ref 0–44)
AST: 38 U/L (ref 15–41)
Albumin: 3.5 g/dL (ref 3.5–5.0)
Alkaline Phosphatase: 60 U/L (ref 38–126)
Anion gap: 13 (ref 5–15)
BUN: 14 mg/dL (ref 6–20)
CO2: 18 mmol/L — ABNORMAL LOW (ref 22–32)
Calcium: 8.6 mg/dL — ABNORMAL LOW (ref 8.9–10.3)
Chloride: 103 mmol/L (ref 98–111)
Creatinine, Ser: 1.27 mg/dL — ABNORMAL HIGH (ref 0.61–1.24)
GFR, Estimated: >60 mL/min
Glucose, Bld: 149 mg/dL — ABNORMAL HIGH (ref 70–99)
Potassium: 3.2 mmol/L — ABNORMAL LOW (ref 3.5–5.1)
Sodium: 134 mmol/L — ABNORMAL LOW (ref 135–145)
Total Bilirubin: 1.7 mg/dL — ABNORMAL HIGH (ref 0.3–1.2)
Total Protein: 6.4 g/dL — ABNORMAL LOW (ref 6.5–8.1)

## 2020-01-12 LAB — CBC WITH DIFFERENTIAL/PLATELET
Abs Immature Granulocytes: 0.04 10*3/uL (ref 0.00–0.07)
Basophils Absolute: 0 10*3/uL (ref 0.0–0.1)
Basophils Relative: 0 %
Eosinophils Absolute: 0 10*3/uL (ref 0.0–0.5)
Eosinophils Relative: 0 %
HCT: 20.9 % — ABNORMAL LOW (ref 39.0–52.0)
Hemoglobin: 7.3 g/dL — ABNORMAL LOW (ref 13.0–17.0)
Immature Granulocytes: 0 %
Lymphocytes Relative: 12 %
Lymphs Abs: 1.3 10*3/uL (ref 0.7–4.0)
MCH: 34.1 pg — ABNORMAL HIGH (ref 26.0–34.0)
MCHC: 34.9 g/dL (ref 30.0–36.0)
MCV: 97.7 fL (ref 80.0–100.0)
Monocytes Absolute: 0.5 10*3/uL (ref 0.1–1.0)
Monocytes Relative: 4 %
Neutro Abs: 9.5 10*3/uL — ABNORMAL HIGH (ref 1.7–7.7)
Neutrophils Relative %: 84 %
Platelets: 71 10*3/uL — ABNORMAL LOW (ref 150–400)
RBC: 2.14 MIL/uL — ABNORMAL LOW (ref 4.22–5.81)
RDW: 14.8 % (ref 11.5–15.5)
WBC: 11.3 10*3/uL — ABNORMAL HIGH (ref 4.0–10.5)
nRBC: 0 % (ref 0.0–0.2)

## 2020-01-12 LAB — PROTIME-INR
INR: 3.1 — ABNORMAL HIGH (ref 0.8–1.2)
Prothrombin Time: 31 seconds — ABNORMAL HIGH (ref 11.4–15.2)

## 2020-01-12 LAB — MRSA PCR SCREENING: MRSA by PCR: NEGATIVE

## 2020-01-12 LAB — MAGNESIUM: Magnesium: 1.6 mg/dL — ABNORMAL LOW (ref 1.7–2.4)

## 2020-01-12 LAB — LACTATE DEHYDROGENASE: LDH: 170 U/L (ref 98–192)

## 2020-01-12 LAB — PREPARE RBC (CROSSMATCH)

## 2020-01-12 MED ORDER — PANTOPRAZOLE SODIUM 40 MG PO TBEC
40.0000 mg | DELAYED_RELEASE_TABLET | Freq: Two times a day (BID) | ORAL | Status: DC
  Administered 2020-01-12 – 2020-01-21 (×18): 40 mg via ORAL
  Filled 2020-01-12 (×18): qty 1

## 2020-01-12 MED ORDER — SODIUM CHLORIDE 0.9% IV SOLUTION: Freq: Once | INTRAVENOUS | Status: AC

## 2020-01-12 MED ORDER — ACETAMINOPHEN 325 MG PO TABS: 650.0000 mg | ORAL_TABLET | ORAL | Status: DC | PRN

## 2020-01-12 MED ORDER — SILDENAFIL CITRATE 20 MG PO TABS
20.0000 mg | ORAL_TABLET | Freq: Three times a day (TID) | ORAL | Status: DC
  Administered 2020-01-12: 20 mg via ORAL
  Filled 2020-01-12: qty 1

## 2020-01-12 MED ORDER — POTASSIUM CHLORIDE CRYS ER 20 MEQ PO TBCR
40.0000 meq | EXTENDED_RELEASE_TABLET | ORAL | Status: AC
  Administered 2020-01-12 – 2020-01-13 (×2): 40 meq via ORAL
  Filled 2020-01-12 (×2): qty 2

## 2020-01-12 MED ORDER — ONDANSETRON HCL 4 MG/2ML IJ SOLN: 4.0000 mg | Freq: Four times a day (QID) | INTRAMUSCULAR | Status: DC | PRN

## 2020-01-12 MED ORDER — PANTOPRAZOLE SODIUM 40 MG PO TBEC: 40.0000 mg | DELAYED_RELEASE_TABLET | Freq: Every day | ORAL | Status: DC

## 2020-01-12 MED ORDER — MAGNESIUM SULFATE 4 GM/100ML IV SOLN
4.0000 g | Freq: Once | INTRAVENOUS | Status: AC
  Administered 2020-01-12: 4 g via INTRAVENOUS
  Filled 2020-01-12: qty 100

## 2020-01-12 NOTE — Progress Notes (Signed)
ANTICOAGULATION CONSULT NOTE - Initial Consult  Pharmacy Consult for warfarin Indication: LVAD  No Known Allergies  Patient Measurements: Height: 5\' 5"  (165.1 cm) Weight: 50.7 kg (111 lb 11.2 oz) IBW/kg (Calculated) : 61.5 Heparin Dosing Weight: 50.7 kg  Vital Signs: BP: 106/85 (12/08 1811) Pulse Rate: 88 (12/08 1811)  Labs: No results for input(s): HGB, HCT, PLT, APTT, LABPROT, INR, HEPARINUNFRC, HEPRLOWMOCWT, CREATININE, CKTOTAL, CKMB, TROPONINIHS in the last 72 hours.  CrCl cannot be calculated (Patient's most recent lab result is older than the maximum 21 days allowed.).   Medical History: Past Medical History:  Diagnosis Date  . Dilated cardiomyopathy (HCC)   . Dilated cardiomyopathy (HCC) 02/2019  . Polysubstance abuse (HCC)     Medications:  Scheduled:  . [START ON 01/13/2020] pantoprazole  40 mg Oral Daily  . sildenafil  20 mg Oral TID    Assessment: 34 yom presenting after nausea, coughing up blood, and having low flows. Went to OSH - Hgb was 8, INR was elevated (received 2 units FFP).   PTA regimen is 5 mg daily except 2.5 mg MWF.   INR tonight is 3.1. Hgb 7.3, plt 71, LDH 170. Plan for 1 PRBC transfusion.  Goal of Therapy:  INR 2-2.5 Monitor platelets by anticoagulation protocol: Yes   Plan:  Hold warfarin  Monitor daily INR, CBC, and for s/sx of bleeding   14/10/2019, PharmD, BCCCP Clinical Pharmacist  Phone: [phone] 01/12/2020 6:59 PM  Please check AMION for all [facility] Pharmacy phone numbers After 10:00 PM, call Main Pharmacy [phone]

## 2020-01-12 NOTE — H&P (Signed)
Advanced Heart Failure VAD History and Physical Note   PCP-Cardiologist: No primary care provider on file.   Reason for Admission: Acute blood loss anemia and supratherapeutic INR  HPI:   [name] is from [facility] [place] with history of severe systolic HF due to NICM EF 15% and prior polysubstance abuse (cocaine and ETOH). 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   Underwent HM-3 LVAD placement on 02/26/19 by Dr. Morton Peters. Extubated 1/25. Required milrinone post op for RV support. Milrinone eventually weaned and Co-ox remained stable of inotropes at 61%. Later placed on sildenafil for continued RV support. Post op recovery complicated by hypoxic respiratory failure, moderate sized rt pneumothorax requiring CT placement.  12/29/19 INR 2.3 -VAD Pharmacy Note-  Instructed him to continue current regimen of warfarin 2.5 mg MWF and warfarin 5 mg all other days. Repeat INR in 2 weeks.   Says that his lip got scabbed up recently from the cold weather. Woke up this morning and his lip was bleeding and was running down his throat then started coughing up blood and having low flow alarms on VAD. Denies ab pain, melena or other GI issues. He went to [facility] ED. Hgb was down and INR was 8.0. Given 2UFFP and 2L IVF. Transferred to [facility] for admit.    Says he feels much better now. No further bleeding or VAD alarms. MAP now 90. INR 3.0. HGb 7.3  LVAD issues - 07/2019 low flows --> GI bug.  -11/2019 low flows---> Speed turned down to 5200  LVAD INTERROGATION:  HeartMate III LVAD:  Flow 3.6 liters/min, speed 5200, power 3.5, PI 4.2 multiple low flow alarms.     Review of Systems: [y] = yes, [ ]  = no   General: Weight gain [ ] ; Weight loss [ ] ; Anorexia [ ] ; Fatigue [ ] ; Fever [ ] ; Chills [ ] ; Weakness [ ]   Cardiac: Chest pain/pressure [ ] ; Resting SOB [ ] ; Exertional SOB [ ] ; Orthopnea [ ] ; Pedal Edema [ ] ; Palpitations [ ] ; Syncope [ ] ; Presyncope [ ] ; Paroxysmal nocturnal dyspnea[ ]    Pulmonary: Cough [ ] ; Wheezing[ ] ; Hemoptysis[ ] ; Sputum [ ] ; Snoring [ ]   GI: Vomiting[y ]; Dysphagia[ ] ; Melena[ ] ; Hematochezia [ ] ; Heartburn[ ] ; Abdominal pain [ ] ; Constipation [ ] ; Diarrhea [ ] ; BRBPR [ ]   GU: Hematuria[ ] ; Dysuria [ ] ; Nocturia[ ]   Vascular: Pain in legs with walking [ ] ; Pain in feet with lying flat [ ] ; Non-healing sores [ ] ; Stroke [ ] ; TIA [ ] ; Slurred speech [ ] ;  Neuro: Headaches[ ] ; Vertigo[ ] ; Seizures[ ] ; Paresthesias[ ] ;Blurred vision [ ] ; Diplopia [ ] ; Vision changes [ ]   Ortho/Skin: Arthritis [ ] ; Joint pain [ ] ; Muscle pain [ ] ; Joint swelling [ ] ; Back Pain [ ] ; Rash [ ]   Psych: Depression[ ] ; Anxiety[ ]   Heme: Bleeding problems [ ] ; Clotting disorders [ ] ; Anemia [ ]   Endocrine: Diabetes [ ] ; Thyroid dysfunction[ ]     Home Medications Prior to Admission medications   Medication Sig Start Date End Date Taking? Authorizing Provider  enoxaparin (LOVENOX) 30 MG/0.3ML injection Inject 0.3 mLs (30 mg total) into the skin 2 (two) times daily. 11/30/19   Belkis Norbeck, , MD  metoprolol tartrate (LOPRESSOR) 25 MG tablet Take 12.5 mg by mouth daily.    [provider]  pantoprazole (PROTONIX) 40 MG tablet Take 1 tablet (40 mg total) by mouth daily. 11/19/19   Natallia Stellmach,  R, MD  potassium chloride SA (KLOR-CON M20) 20 MEQ tablet Take 2 tablets (40 mEq total) by mouth daily. Pt needs to take 80 twice today only for a potassium of 2.6 11/23/19 02/21/20  Ashon Rosenberg, Bevelyn Buckles, MD  sildenafil (REVATIO) 20 MG tablet Take 1 tablet (20 mg total) by mouth 3 (three) times daily. 10/20/19   Eily Louvier, Bevelyn Buckles, MD  warfarin (COUMADIN) 5 MG tablet Take 2.5 mg (1/2 tab) every MWF and 5 mg (1 tablet) all other days or as directed by HF Clinic 11/30/19   Evon Slack, RPH-CPP    Past Medical History: Past Medical History:  Diagnosis Date  . Dilated cardiomyopathy (HCC)   . Dilated cardiomyopathy (HCC) 02/2019  . Polysubstance abuse [facility])     Past  Surgical History: Past Surgical History:  Procedure Laterality Date  . INSERTION OF IMPLANTABLE LEFT VENTRICULAR ASSIST DEVICE N/A 02/26/2019   Procedure: INSERTION OF IMPLANTABLE LEFT VENTRICULAR ASSIST DEVICE;  Surgeon: Kerin Perna, MD;  Location: [facility] OR;  Service: Open Heart Surgery;  Laterality: N/A;  HM3  . IR THORACENTESIS ASP PLEURAL SPACE W/IMG GUIDE  03/10/2019  . NO PAST SURGERIES    . RIGHT HEART CATH N/A 02/25/2019   Procedure: RIGHT HEART CATH;  Surgeon: Dolores Patty, MD;  Location: [facility] INVASIVE CV LAB;  Service: Cardiovascular;  Laterality: N/A;  . RIGHT/LEFT HEART CATH AND CORONARY ANGIOGRAPHY N/A 02/11/2019   Procedure: RIGHT/LEFT HEART CATH AND CORONARY ANGIOGRAPHY;  Surgeon: Dolores Patty, MD;  Location: [facility] INVASIVE CV LAB;  Service: Cardiovascular;  Laterality: N/A;  . TEE WITHOUT CARDIOVERSION N/A 02/26/2019   Procedure: TRANSESOPHAGEAL ECHOCARDIOGRAM (TEE);  Surgeon: Donata Clay, Theron Arista, MD;  Location: [facility] OR;  Service: Open Heart Surgery;  Laterality: N/A;  . VIDEO BRONCHOSCOPY N/A 02/26/2019   Procedure: Video Bronchoscopy;  Surgeon: Kerin Perna, MD;  Location: [facility] OR;  Service: Open Heart Surgery;  Laterality: N/A;    Family History: Family History  Problem Relation Age of Onset  . CAD Father     Social History: Social History   Socioeconomic History  . Marital status: Single    Spouse name: Not on file  . Number of children: Not on file  . Years of education: Not on file  . Highest education level: Not on file  Occupational History  . Not on file  Tobacco Use  . Smoking status: Former Smoker    Types: Cigarettes  . Smokeless tobacco: Never Used  Vaping Use  . Vaping Use: Some days  Substance and Sexual Activity  . Alcohol use: Yes  . Drug use: Not Currently    Comment: Hx of cocaine use  . Sexual activity: Not on file  Other Topics Concern  . Not on file  Social History Narrative  . Not on file   Social Determinants of Health    Financial Resource Strain: High Risk  . Difficulty of Paying Living Expenses: Hard  Food Insecurity: Food Insecurity Present  . Worried About Programme researcher, broadcasting/film/video in the Last Year: Sometimes true  . Ran Out of Food in the Last Year: Sometimes true  Transportation Needs: Unmet Transportation Needs  . Lack of Transportation (Medical): Yes  . Lack of Transportation (Non-Medical): No  Physical Activity: Sufficiently Active  . Days of Exercise per Week: 7 days  . Minutes of Exercise per Session: 60 min  Stress:   . Feeling of Stress : Not on file  Social Connections:   . Frequency of Communication  with Friends and Family: Not on file  . Frequency of Social Gatherings with Friends and Family: Not on file  . Attends Religious Services: Not on file  . Active Member of Clubs or Organizations: Not on file  . Attends Banker Meetings: Not on file  . Marital Status: Not on file    Allergies:  No Known Allergies  Objective:    Vital Signs:   Temp:  [99.2 F (37.3 C)] 99.2 F (37.3 C) (12/08 1912) Pulse Rate:  [88-111] 111 (12/08 1912) Resp:  [19] 19 (12/08 1912) BP: (95-106)/(83-85) 95/83 (12/08 1912) SpO2:  [100 %] 100 % (12/08 1912) Weight:  [50.7 kg] 50.7 kg (12/08 1811) Last BM Date: 01/12/20 (per patient) [facility] Weights   01/12/20 1811  Weight: 50.7 kg    Mean arterial Pressure  80-90s  Physical Exam    General:  Well appearing. No resp difficulty HEENT: Normal scabbed over lesion on lower lip with dried blood Neck: supple. JVP flat . Carotids 2+ bilat; no bruits. No lymphadenopathy or thyromegaly appreciated. Cor: Mechanical heart sounds with LVAD hum present. Lungs: Clear Abdomen: soft, nontender, nondistended. No hepatosplenomegaly. No bruits or masses. Good bowel sounds. Driveline: C/D/I; securement device intact and driveline incorporated Extremities: no cyanosis, clubbing, rash, edema Neuro: alert & orientedx3, cranial nerves grossly intact. moves  all 4 extremities w/o difficulty. Affect pleasant   Telemetry   Sinus tach 110-120 Personally reviewed  EKG   Pending  Labs    Basic Metabolic Panel: Recent Labs  Lab 01/12/20 2002  NA 134*  K 3.2*  CL 103  CO2 18*  GLUCOSE 149*  BUN 14  CREATININE 1.27*  CALCIUM 8.6*  MG 1.6*    Liver Function Tests: Recent Labs  Lab 01/12/20 2002  AST 38  ALT 24  ALKPHOS 60  BILITOT 1.7*  PROT 6.4*  ALBUMIN 3.5   No results for input(s): LIPASE, AMYLASE in the last 168 hours. No results for input(s): AMMONIA in the last 168 hours.  CBC: Recent Labs  Lab 01/12/20 2002  WBC 11.3*  NEUTROABS 9.5*  HGB 7.3*  HCT 20.9*  MCV 97.7  PLT 71*    Cardiac Enzymes: No results for input(s): CKTOTAL, CKMB, CKMBINDEX, TROPONINI in the last 168 hours.  BNP: BNP (last 3 results) Recent Labs    02/27/19 0433 03/05/19 0056 03/12/19 0542  BNP 447.0* 427.2* 135.5*    ProBNP (last 3 results) No results for input(s): PROBNP in the last 8760 hours.   CBG: No results for input(s): GLUCAP in the last 168 hours.  Coagulation Studies: Recent Labs    01/12/20 2002  LABPROT 31.0*  INR 3.1*    Other results:   Imaging    No results found.   Assessment/Plan:    1. Acute blood loss anemia - clearly has source of bleeding on lower lip but drop in hgb seems out of proportion. ? Occult GI bleed - will hold coumadin and ASA - tranfuse 1u RBC and follow hgb trend. May need repeat endoscopy - MAP stable - start PPI  2. Supratherapeutic INR - INR at [facility] elelvated.  Given 2 units FFP prior to transfer.  - Hold warfarin Follow INR  - Discussed dwith PharmD personally.  3.  Chronic Systolic HF due to NICM - Echo EF 10% with biventricular failure - HM-3 VAD implant 02/26/19 - Echo 7/21 EF 40-45% - No bb or ARB with previous dehydration/RV failure.  - Hold sildenafil  - seems volume replete  currently  4. Hypokalemia/hypomag - supp   5. F/E/N - I worry about  his nutritional status after his mother died. Check prealbumin in am    I reviewed the LVAD parameters from today, and compared the results to the patient's prior recorded data.  No programming changes were made.  The LVAD is functioning within specified parameters.  The patient performs LVAD self-test daily.  LVAD interrogation was negative for any significant power changes, alarms or PI events/speed drops.  LVAD equipment check completed and is in good working order.  Back-up equipment present.   LVAD education done on emergency procedures and precautions and reviewed exit site care.  Length of Stay: 0  Arvilla Meres, MD 01/12/2020, 9:22 PM  VAD Team Pager [phone] (7am - 7am) +++VAD ISSUES ONLY+++   Advanced Heart Failure Team Pager [phone] (M-F; 7a - 4p)  Please contact [facility] for night-coverage after hours (4p -7a ) and weekends on [email or web address] for all non- LVAD Issues

## 2020-01-13 ENCOUNTER — Encounter (HOSPITAL_COMMUNITY): Payer: Self-pay | Admitting: Internal Medicine

## 2020-01-13 ENCOUNTER — Telehealth (HOSPITAL_COMMUNITY): Payer: Self-pay | Admitting: Pharmacist

## 2020-01-13 DIAGNOSIS — D649 Anemia, unspecified: Secondary | ICD-10-CM

## 2020-01-13 LAB — BASIC METABOLIC PANEL
Anion gap: 9 (ref 5–15)
BUN: 12 mg/dL (ref 6–20)
CO2: 22 mmol/L (ref 22–32)
Calcium: 9 mg/dL (ref 8.9–10.3)
Chloride: 104 mmol/L (ref 98–111)
Creatinine, Ser: 0.94 mg/dL (ref 0.61–1.24)
GFR, Estimated: >60 mL/min (ref >60)
Glucose, Bld: 100 mg/dL — ABNORMAL HIGH (ref 70–99)
Potassium: 3.9 mmol/L (ref 3.5–5.1)
Sodium: 135 mmol/L (ref 135–145)

## 2020-01-13 LAB — HEMOGLOBIN AND HEMATOCRIT, BLOOD
HCT: 25.1 % — ABNORMAL LOW (ref 39.0–52.0)
Hemoglobin: 8.5 g/dL — ABNORMAL LOW (ref 13.0–17.0)

## 2020-01-13 LAB — CBC
HCT: 19.2 % — ABNORMAL LOW (ref 39.0–52.0)
Hemoglobin: 6.5 g/dL — CL (ref 13.0–17.0)
MCH: 33.3 pg (ref 26.0–34.0)
MCHC: 33.9 g/dL (ref 30.0–36.0)
MCV: 98.5 fL (ref 80.0–100.0)
Platelets: 63 10*3/uL — ABNORMAL LOW (ref 150–400)
RBC: 1.95 MIL/uL — ABNORMAL LOW (ref 4.22–5.81)
RDW: 14.9 % (ref 11.5–15.5)
WBC: 8.3 10*3/uL (ref 4.0–10.5)
nRBC: 0 % (ref 0.0–0.2)

## 2020-01-13 LAB — PROTIME-INR
INR: 3.8 — ABNORMAL HIGH (ref 0.8–1.2)
Prothrombin Time: 36 seconds — ABNORMAL HIGH (ref 11.4–15.2)

## 2020-01-13 LAB — RAPID URINE DRUG SCREEN, HOSP PERFORMED
Amphetamines: NOT DETECTED
Barbiturates: NOT DETECTED
Benzodiazepines: NOT DETECTED
Cocaine: NOT DETECTED
Opiates: NOT DETECTED
Tetrahydrocannabinol: POSITIVE — AB

## 2020-01-13 LAB — RESP PANEL BY RT-PCR (FLU A&B, COVID) ARPGX2
Influenza A by PCR: NEGATIVE
Influenza B by PCR: NEGATIVE
SARS Coronavirus 2 by RT PCR: NEGATIVE

## 2020-01-13 LAB — MAGNESIUM: Magnesium: 2.3 mg/dL (ref 1.7–2.4)

## 2020-01-13 LAB — LACTATE DEHYDROGENASE: LDH: 180 U/L (ref 98–192)

## 2020-01-13 LAB — PREALBUMIN: Prealbumin: 21.5 mg/dL (ref 18–38)

## 2020-01-13 MED ORDER — WARFARIN SODIUM 5 MG PO TABS
ORAL_TABLET | ORAL | 11 refills | Status: DC
Start: 2020-01-13 — End: 2020-01-21

## 2020-01-13 MED ORDER — ENSURE ENLIVE PO LIQD
237.0000 mL | Freq: Once | ORAL | Status: AC
  Administered 2020-01-13: 237 mL via ORAL

## 2020-01-13 MED ORDER — WHITE PETROLATUM EX OINT
TOPICAL_OINTMENT | CUTANEOUS | Status: DC | PRN
  Filled 2020-01-13: qty 28.35

## 2020-01-13 MED FILL — Sodium Chloride Flush IV Soln 0.9%: INTRAVENOUS | Qty: 10 | Status: AC

## 2020-01-13 MED FILL — Diphenhydramine HCl Inj 50 MG/ML: INTRAMUSCULAR | Qty: 1 | Status: AC

## 2020-01-13 NOTE — TOC Progression Note (Signed)
Transition of Care [facility]) - Progression Note    Patient Details  Name: Jon Martin MRN: [id number] Date of Birth: [birth year]  [facility] CM/SW Contact  Leone Haven, RN Phone Number: 01/13/2020, 2:01 PM  Clinical Narrative:    LVAD patient From home with two brothers, patient states he does his own dressing changes. NCM asked if he needs HHRN to follow up, he states no.  He states he uses the Medicare transportation for his MD apts, but he will need transport ast at discharge. TOC team will cont to follow for dc needs.         Expected Discharge Plan and Services                                                 Social Determinants of Health (SDOH) Interventions    Readmission Risk Interventions No flowsheet data found.

## 2020-01-13 NOTE — Progress Notes (Signed)
LVAD Coordinator Rounding Note:  Admitted 01/12/20 due bleeding from his mouth in the setting of supratherapuetic INR, low hgb and low flows.   HM III LVAD implanted on 02/26/19 by Dr Donata Clay under destination therapy criteria.  Pt asleep in bed on my arrival. Once awake pt tells me that he feels much better. He states that he has been taking his coumadin exactly as Lauren has instructed. Pt brought his medications to the hosp, I confirmed that he is taking 2.5 mg on MWF and 5 mg AOD. Pt does report that he recently had his coumadin refilled and that the pills did not look the same as his last refill. [facility] may have changed manufactures for their coumadin and this could have caused the increased INR. D/w Leotis Shames - pharm D who will [facility] # of current warfarin.  Prealbumin is 21.5. weight is down around 6lbs since last clinic visit.  Pt received 1 u PRBC overnight for a hgb of 6.5. hgb is up to 8.5 this morning.   Vital signs: Temp: 98.8 HR: 90 Doppler Pressure: 88  Automatic BP: 90/70 (77) O2 Sat: 100% on RA Wt: 115.3 lbs  LVAD interrogation reveals:  Speed: 5200 Flow: 3.5  Power: 3.6 w PI: 4.6  Alarms: none today, pt had frequent low flows yesterday 12/8 Events:  4 PI events today Hematocrit: 20 (DO NOT ADJUST)  Fixed speed: 5200 Low speed limit: 4900  Drive Line: Exit site maintained by patient weekly. Dressing had a hole ripped in it right over driveline.  Existing VAD dressing removed and site care performed using sterile technique. Drive line exit site cleaned with Chlora prep applicators x 2, allowed to dry, and Sorbaview dressing with bio patch re-applied. Exit site healed and incorporated, the velour is fully implanted at exit site. Scant amount of bloody drainage. No redness, tenderness, foul odor or rash noted. Drive line anchor re-applied. Next dressing change due 01/20/20 per bedside RN.   Labs:  LDH trend: 180  INR trend:  3.8  Anticoagulation Plan: -INR Goal: 2.0-2.5 -ASA Dose: none  Blood products: - 1u/PRBC 01/13/20  Adverse Events on VAD: - Admitted 08/02/19 due to N/V, and weakness. Creatinine elevated 2.7.   Plan/Recommendations:  1. Page VAD coordinator for any drive line or equipment issues 2. Weekly drive line dressing change per bedside RN   Carlton Adam RN VAD Coordinator  Office: [phone]  24/7 Pager: [phone]

## 2020-01-13 NOTE — Telephone Encounter (Signed)
Received message that patient's INR was 8.0 upon presentation to outside hospital. [facility] and confirmed that a different warfarin manufacturer was dispensed on his last fill. This could have contributed to the elevated INR. I sent [facility] a new warfarin prescription with instructions to only dispense NDC [phone] (TEVA) as this was the manufacturer he was using before.   Karle Plumber, PharmD, BCPS, BCCP, CPP Heart Failure Clinic Pharmacist [phone]

## 2020-01-13 NOTE — Progress Notes (Signed)
CRITICAL VALUE ALERT  Critical Value: Hgb 6.5  Date & Time Notied: 01/13/2020 0110  Provider Notified:   Orders Received/Actions taken: Pt currently transfusing 1 unit PRBC

## 2020-01-13 NOTE — Plan of Care (Signed)

## 2020-01-13 NOTE — Progress Notes (Addendum)
Advanced Heart Failure VAD Team Note  PCP-Cardiologist: Dr. Gala Romney   Patient Profile   Jon Kmetz "[name] is from [facility] [place] with history of severe systolic HF due to NICM EF 15% and prior polysubstance abuse (cocaine and ETOH). 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   Admitted for ABLA (bleeding from mouth) in setting of supra therapeutic INR and had low flow alarms.   INR 8.0 on admit. Hgb 7.3>>6.5.    Subjective:    Received FFP + 1 unit of PRBCs yesterday.   Hgb up to 8.5 today. Plts low at 63K.  INR remains elevated at 3.8.   Denies any further bleedding.   MAP 88. No further low flow alarms   He feels well. Sitting up in bed. No complaints.     LVAD INTERROGATION:  HeartMate III LVAD:   Flow 3.4 liters/min, speed 5250, power 3.6, PI 5.3. 4 PI events     Objective:    Vital Signs:   Temp:  [98.4 F (36.9 C)-99.2 F (37.3 C)] 98.7 F (37.1 C) (12/09 0259) Pulse Rate:  [88-111] 92 (12/09 0259) Resp:  [11-19] 12 (12/09 0259) BP: (88-106)/(62-85) 88/72 (12/09 0309) SpO2:  [99 %-100 %] 100 % (12/09 0259) Weight:  [50.7 kg-52.3 kg] 52.3 kg (12/09 0500) Last BM Date: 01/12/20 (per patient) Mean arterial Pressure 80s-90s   Intake/Output:   Intake/Output Summary (Last 24 hours) at 01/13/2020 0756 Last data filed at 01/13/2020 0259 Gross per 24 hour  Intake 1505 ml  Output 125 ml  Net 1380 ml     Physical Exam    General:  Well appearing young male, thin. No resp difficulty HEENT: normal Neck: supple. JVP . Carotids 2+ bilat; no bruits. No lymphadenopathy or thyromegaly appreciated. Cor: Mechanical heart sounds with LVAD hum present. Lungs: clear Abdomen: soft, nontender, nondistended. No hepatosplenomegaly. No bruits or masses. Good bowel sounds. Driveline: C/D/I; securement device intact and driveline incorporated, small amount of dried blood present around DL insertion site  Extremities: no cyanosis, clubbing, rash, edema Neuro: alert & orientedx3,  cranial nerves grossly intact. moves all 4 extremities w/o difficulty. Affect pleasant   Telemetry   NSR 80s  EKG    No new EKG to review   Labs   Basic Metabolic Panel: Recent Labs  Lab 01/12/20 2002 01/13/20 0009  NA 134* 135  K 3.2* 3.9  CL 103 104  CO2 18* 22  GLUCOSE 149* 100*  BUN 14 12  CREATININE 1.27* 0.94  CALCIUM 8.6* 9.0  MG 1.6*  --     Liver Function Tests: Recent Labs  Lab 01/12/20 2002  AST 38  ALT 24  ALKPHOS 60  BILITOT 1.7*  PROT 6.4*  ALBUMIN 3.5   No results for input(s): LIPASE, AMYLASE in the last 168 hours. No results for input(s): AMMONIA in the last 168 hours.  CBC: Recent Labs  Lab 01/12/20 2002 01/13/20 0009 01/13/20 0607  WBC 11.3* 8.3  --   NEUTROABS 9.5*  --   --   HGB 7.3* 6.5* 8.5*  HCT 20.9* 19.2* 25.1*  MCV 97.7 98.5  --   PLT 71* 63*  --     INR: Recent Labs  Lab 01/12/20 2002 01/13/20 0009  INR 3.1* 3.8*    Other results:  EKG:    Imaging    No results found.   Medications:     Scheduled Medications: . pantoprazole  40 mg Oral BID     Infusions:   PRN Medications:  acetaminophen,  ondansetron (ZOFRAN) IV    Assessment/Plan:     1. Acute blood loss anemia - clearly has source of bleeding on lower lip but drop in hgb seems out of proportion. ? Occult GI bleed. FOBT ordered  - INR remains elevated at 3.8 but no further obvious bleeding - hgb up to 8.5 today after transfusion  - continue to hold coumadin and ASA - May need repeat endoscopy if further drop in Hgb or if FOBT +  - MAPs stable in 80s - start PPI - closely follow CBC   2. Supratherapeutic INR - INR at [facility] elelvated.  Given 2 units FFP prior to transfer.  - 3.8 today, Plts low at 68K, may need additional FFP  - Hold warfarin Follow INR  - Discussed dwith PharmD personally.  3.  Chronic Systolic HF due to NICM - Echo EF 10% with biventricular failure - HM-3 VAD implant 02/26/19 - Echo 7/21 EF 40-45% - No  bb or ARB with previous dehydration/RV failure.  - Hold sildenafil  - seems volume replete currently  4. Hypokalemia/hypomag - K 3.9 today  - recheck Mg level   5. F/E/N - I worry about his nutritional status after his mother died.  - Prealbumin level normal     I reviewed the LVAD parameters from today, and compared the results to the patient's prior recorded data.  No programming changes were made.  The LVAD is functioning within specified parameters.  The patient performs LVAD self-test daily.  LVAD interrogation was negative for any significant power changes, alarms or PI events/speed drops.  LVAD equipment check completed and is in good working order.  Back-up equipment present.   LVAD education done on emergency procedures and precautions and reviewed exit site care.  Length of Stay: 1  Jon Martin 01/13/2020, 7:56 AM  VAD Team --- VAD ISSUES ONLY--- Pager [phone] (7am - 7am)  Advanced Heart Failure Team  Pager [phone] (M-F; 7a - 4p)  Please contact [facility] for night-coverage after hours (4p -7a ) and weekends on [email or web address]  Patient seen and examined with the above-signed Advanced Practice Provider and/or Housestaff. I personally reviewed laboratory data, imaging studies and relevant notes. I independently examined the patient and formulated the important aspects of the plan. I have edited the note to reflect any of my changes or salient points. I have personally discussed the plan with the patient and/or family.  Hgb up to 8.5 after transfusion. No evidence of ongoing bleeding. INR up to 3.8 No further VAD alarms. Electrolytes improved.   General:  NAD.  HEENT: normal  Scab on lower lip Neck: supple. JVP not elevated.  Carotids 2+ bilat; no bruits. No lymphadenopathy or thryomegaly appreciated. Cor: LVAD hum.  Lungs: Clear. Abdomen: obese soft, nontender, non-distended. No hepatosplenomegaly. No bruits or masses. Good bowel sounds. Driveline site  clean. Anchor in place.  Extremities: no cyanosis, clubbing, rash. Warm no edema  Neuro: alert & oriented x 3. No focal deficits. Moves all 4 without problem   Hgb stable this am. Will follow. If drops again will need GI to see. INR 3.8. Hold warfarin. Give Ensure. D/w HF PharmD. Electrolytes improved. VAD interrogated personally. Parameters stable.  Arvilla Meres, MD  6:23 PM

## 2020-01-14 ENCOUNTER — Inpatient Hospital Stay (HOSPITAL_COMMUNITY): Payer: Medicaid - Out of State

## 2020-01-14 ENCOUNTER — Other Ambulatory Visit (HOSPITAL_COMMUNITY): Payer: 59

## 2020-01-14 DIAGNOSIS — T829XXA Unspecified complication of cardiac and vascular prosthetic device, implant and graft, initial encounter: Secondary | ICD-10-CM

## 2020-01-14 DIAGNOSIS — I5022 Chronic systolic (congestive) heart failure: Secondary | ICD-10-CM

## 2020-01-14 LAB — BASIC METABOLIC PANEL
Anion gap: 10 (ref 5–15)
BUN: <5 mg/dL — ABNORMAL LOW (ref 6–20)
CO2: 23 mmol/L (ref 22–32)
Calcium: 8.6 mg/dL — ABNORMAL LOW (ref 8.9–10.3)
Chloride: 100 mmol/L (ref 98–111)
Creatinine, Ser: 0.87 mg/dL (ref 0.61–1.24)
GFR, Estimated: >60 mL/min (ref >60)
Glucose, Bld: 127 mg/dL — ABNORMAL HIGH (ref 70–99)
Potassium: 3.3 mmol/L — ABNORMAL LOW (ref 3.5–5.1)
Sodium: 133 mmol/L — ABNORMAL LOW (ref 135–145)

## 2020-01-14 LAB — CBC
HCT: 24.8 % — ABNORMAL LOW (ref 39.0–52.0)
Hemoglobin: 8.4 g/dL — ABNORMAL LOW (ref 13.0–17.0)
MCH: 32.1 pg (ref 26.0–34.0)
MCHC: 33.9 g/dL (ref 30.0–36.0)
MCV: 94.7 fL (ref 80.0–100.0)
Platelets: 55 10*3/uL — ABNORMAL LOW (ref 150–400)
RBC: 2.62 MIL/uL — ABNORMAL LOW (ref 4.22–5.81)
RDW: 16.3 % — ABNORMAL HIGH (ref 11.5–15.5)
WBC: 4.8 10*3/uL (ref 4.0–10.5)
nRBC: 0 % (ref 0.0–0.2)

## 2020-01-14 LAB — TYPE AND SCREEN
ABO/RH(D): O POS
Antibody Screen: NEGATIVE
Unit division: 0

## 2020-01-14 LAB — LACTATE DEHYDROGENASE
LDH: 396 U/L — ABNORMAL HIGH (ref 98–192)
LDH: 437 U/L — ABNORMAL HIGH (ref 98–192)
LDH: 387 U/L — ABNORMAL HIGH (ref 98–192)

## 2020-01-14 LAB — TECHNOLOGIST SMEAR REVIEW

## 2020-01-14 LAB — BPAM RBC
Blood Product Expiration Date: 202201122359
ISSUE DATE / TIME: 202112090033
Unit Type and Rh: 5100

## 2020-01-14 LAB — ECHOCARDIOGRAM LIMITED
Height: 65 in
Weight: 1883.2 oz

## 2020-01-14 LAB — HEMOGLOBIN AND HEMATOCRIT, BLOOD
HCT: 24.6 % — ABNORMAL LOW (ref 39.0–52.0)
Hemoglobin: 8.1 g/dL — ABNORMAL LOW (ref 13.0–17.0)

## 2020-01-14 LAB — PROTIME-INR
INR: 3.7 — ABNORMAL HIGH (ref 0.8–1.2)
Prothrombin Time: 35.5 seconds — ABNORMAL HIGH (ref 11.4–15.2)

## 2020-01-14 LAB — OCCULT BLOOD X 1 CARD TO LAB, STOOL: Fecal Occult Bld: POSITIVE — AB

## 2020-01-14 MED ORDER — ENSURE ENLIVE PO LIQD
237.0000 mL | Freq: Once | ORAL | Status: AC
  Administered 2020-01-14: 237 mL via ORAL

## 2020-01-14 MED ORDER — SODIUM CHLORIDE 0.9 % IV BOLUS
1000.0000 mL | Freq: Once | INTRAVENOUS | Status: AC
  Administered 2020-01-14: 1000 mL via INTRAVENOUS

## 2020-01-14 MED ORDER — IOHEXOL 350 MG/ML SOLN
75.0000 mL | Freq: Once | INTRAVENOUS | Status: AC | PRN
  Administered 2020-01-14: 75 mL via INTRAVENOUS

## 2020-01-14 MED ORDER — POTASSIUM CHLORIDE CRYS ER 20 MEQ PO TBCR
40.0000 meq | EXTENDED_RELEASE_TABLET | ORAL | Status: AC
  Administered 2020-01-14 (×2): 40 meq via ORAL
  Filled 2020-01-14 (×2): qty 2

## 2020-01-14 MED ORDER — SODIUM CHLORIDE 0.9 % IV BOLUS
500.0000 mL | Freq: Once | INTRAVENOUS | Status: AC
  Administered 2020-01-14: 500 mL via INTRAVENOUS

## 2020-01-14 NOTE — Progress Notes (Signed)
CSW met with patient at bedside. Patient shared that he and his brothers are doing well despite the recent loss of their mother. Patient shared that he is doing the cooking now for the family since mom passed away. CSW offered some kitchen supplies to assist with the family cooking. Patient graciously accepted stating it would be helpful. He stated that he did not sleep well last night and trying to get some rest. Patient appears in good spirits and denies any other concerns at this time. CSW continues to follow for supportive needs through the outpatient VAD clinic. [name], [place], [place]

## 2020-01-14 NOTE — Plan of Care (Signed)

## 2020-01-14 NOTE — Progress Notes (Addendum)
Advanced Heart Failure VAD Team Note  PCP-Cardiologist: Dr. Gala Romney   Patient Profile   Jon Tosi "[name] is from [facility] [place] with history of severe systolic HF due to NICM EF 15% and prior polysubstance abuse (cocaine and ETOH). 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   Admitted for ABLA (bleeding from mouth) in setting of supra therapeutic INR and had low flow alarms.   INR 8.0 on admit. Hgb 7.3>>6.5.    Subjective:    Received FFP + 1 unit of PRBCs on admit  Hgb improved and stable at 8.4 today. However plts continue to drop, 63>>55K today   INR remains elevated despite coumadin hold and FFP, 3.7 today.  HFTs normal on admit  FOBT ordered but not collected.   Denies any further bleedding. Had 3 low flow alarms this am, lowest flow 2.3. In NSR currently. LDH up from baseline, 180>>396.  MAP ok at 90.  He feels fine. No complaints. Denies fatigue. No dizziness, dyspnea or CP.    LVAD INTERROGATION:  HeartMate III LVAD:   Flow 3.1 liters/min, speed 5200, power 3.6, PI 7.3. 3 low Flow Alarms (2.3, 2.4 and 2.5)   Objective:    Vital Signs:   Temp:  [98.6 F (37 C)-99 F (37.2 C)] 98.6 F (37 C) (12/10 0733) Pulse Rate:  [62-94] 62 (12/10 0826) Resp:  [13-16] 16 (12/10 0733) BP: (94-114)/(64-89) 100/82 (12/10 0733) SpO2:  [98 %-100 %] 98 % (12/10 0303) Weight:  [53.4 kg] 53.4 kg (12/10 0303) Last BM Date: 01/13/20 Mean arterial Pressure 80s-90s   Intake/Output:   Intake/Output Summary (Last 24 hours) at 01/14/2020 0905 Last data filed at 01/14/2020 0734 Gross per 24 hour  Intake 480 ml  Output 1295 ml  Net -815 ml     Physical Exam    General:  Well appearing, resting comfortably in bed. No resp difficulty HEENT: normal Neck: supple. No JVP . Carotids 2+ bilat; no bruits. No lymphadenopathy or thyromegaly appreciated. Cor: Mechanical heart sounds with LVAD hum present. Lungs: clear Abdomen: soft, nontender, nondistended. No hepatosplenomegaly. No bruits or  masses. Good bowel sounds. Driveline: C/D/I; securement device intact and driveline incorporated Extremities: no cyanosis, clubbing, rash, edema Neuro: alert & orientedx3, cranial nerves grossly intact. moves all 4 extremities w/o difficulty. Affect pleasant   Telemetry   NSR 60s, no arrhthymias   EKG    No new EKG to review   Labs   Basic Metabolic Panel: Recent Labs  Lab 01/12/20 2002 01/13/20 0009 01/14/20 0018  NA 134* 135 133*  K 3.2* 3.9 3.3*  CL 103 104 100  CO2 18* 22 23  GLUCOSE 149* 100* 127*  BUN 14 12 <5*  CREATININE 1.27* 0.94 0.87  CALCIUM 8.6* 9.0 8.6*  MG 1.6* 2.3  --     Liver Function Tests: Recent Labs  Lab 01/12/20 2002  AST 38  ALT 24  ALKPHOS 60  BILITOT 1.7*  PROT 6.4*  ALBUMIN 3.5   No results for input(s): LIPASE, AMYLASE in the last 168 hours. No results for input(s): AMMONIA in the last 168 hours.  CBC: Recent Labs  Lab 01/12/20 2002 01/13/20 0009 01/13/20 0607 01/14/20 0018  WBC 11.3* 8.3  --  4.8  NEUTROABS 9.5*  --   --   --   HGB 7.3* 6.5* 8.5* 8.4*  HCT 20.9* 19.2* 25.1* 24.8*  MCV 97.7 98.5  --  94.7  PLT 71* 63*  --  55*    INR: Recent Labs  Lab  01/12/20 2002 01/13/20 0009 01/14/20 0018  INR 3.1* 3.8* 3.7*    Other results:  EKG:    Imaging   No results found.   Medications:     Scheduled Medications: . pantoprazole  40 mg Oral BID    Infusions:   PRN Medications: acetaminophen, ondansetron (ZOFRAN) IV, white petrolatum    Assessment/Plan:     1. Acute blood loss anemia/ Thrombocytopenia  - clearly had source of bleeding on lower lip but drop in hgb seems out of proportion. ? Occult GI bleed. FOBT ordered  - INR remains elevated at 3.7 but no further obvious bleeding - hgb improved and stable today at 8.4 after transfusion  - continue to hold coumadin and ASA - May need repeat endoscopy if further drop in Hgb or if FOBT +  - MAPs stable in 80s-90s - continue PPI - with  unexplained thrombocytopenia, may need hematology consult  - ? splenic sequestration (h/o ETOH use) - closely follow CBC   2. Supratherapeutic INR - INR at [facility] elelvated.  Given 2 units FFP prior to transfer.  - 3.7 today, Plts low at 55K - HFTs normal on admit but will order RUQ Ultrasound  - Hold warfarin Follow INR  - given additional Ensure  - Discussed dwith PharmD personally.  3. Chronic Systolic HF due to NICM - Echo EF 10% with biventricular failure - HM-3 VAD implant 02/26/19 - Echo 7/21 EF 40-45% - No bb or ARB with previous dehydration/RV failure.  - Hold sildenafil  - low flow alarms on VAD. Rhythm stable.  LDH up from baseline, 180>>396.  MAP ok at 90. - Give 1L IVF bolus - Plan ramp echo today  - Plan RUQ ultrasound    4. Hypokalemia/hypomag - K 3.3 today  - Supp w/ KCL   5. F/E/N - I worry about his nutritional status after his mother died.  - Prealbumin level normal    I reviewed the LVAD parameters from today, and compared the results to the patient's prior recorded data.  No programming changes were made.  The LVAD is functioning within specified parameters.  The patient performs LVAD self-test daily.  LVAD interrogation was negative for any significant power changes, alarms or PI events/speed drops.  LVAD equipment check completed and is in good working order.  Back-up equipment present.   LVAD education done on emergency procedures and precautions and reviewed exit site care.  Length of Stay: 2  Robbie Lis, PA-C 01/14/2020, 9:05 AM  VAD Team --- VAD ISSUES ONLY--- Pager [phone] (7am - 7am)  Advanced Heart Failure Team  Pager [phone] (M-F; 7a - 4p)  Please contact [facility] for night-coverage after hours (4p -7a ) and weekends on [email or web address]   Patient seen and examined with the above-signed Advanced Practice Provider and/or Housestaff. I personally reviewed laboratory data, imaging studies and relevant notes. I independently  examined the patient and formulated the important aspects of the plan. I have edited the note to reflect any of my changes or salient points. I have personally discussed the plan with the patient and/or family.  Feels better. No obvious bleeding. Had 2 low flows on VAD this am with multiple PI events. Given 1L of IVF. Hgb stable at 8.4. INR 3.7.  LDH up to 180 -> 396 -> 437.   Platelets falling down to 55K  General:  NAD.  HEENT: normal  Neck: supple. JVP not elevated.  Carotids 2+ bilat; no bruits. No lymphadenopathy or thryomegaly appreciated. Cor: LVAD  hum.  Lungs: Clear. Abdomen: soft, nontender, non-distended. No hepatosplenomegaly. No bruits or masses. Good bowel sounds. Driveline site clean. Anchor in place.  Extremities: no cyanosis, clubbing, rash. Warm no edema  Neuro: alert & oriented x 3. No focal deficits. Moves all 4 without problem   Hgb relatively stable but having low flows as well as dropping PLTs and rising LDH. Concern over possible hemolysis however INR 3.7.   We did ramp echo today and LVEF 30-35% with mild RV dysfunction. VAD unloading well.   Will check RUQ to look for portal HTN/hypersplenism. Check peripheral blood smear. Follow PLTs and LDH closely. Meds reviewed with HF PharmD for offending agents for thrombocytopenia and no major concerns.   Arvilla Meres, MD  3:18 PM

## 2020-01-14 NOTE — Progress Notes (Signed)
RN went into patients room due to low flow alarms going off. Low flow alarms going off approximately ever two minutes.  Pt is alert and oriented. No complaints of  dizziness or lightheadedness. BP 111/74 MAP 87.  RN paged LVAD coordinator to advise. Advise to give 500 ml bolus. RN will continue to monitor.

## 2020-01-14 NOTE — Progress Notes (Addendum)
  Patient with frequent low flow alarms on VAD.   LDH rising. Platelets low.  On exam patient looks comfortable LVAD sounds are normal.   Flows frequently dropping < 2.0L despite 2L IVF bolus.   On echo inflow cannula ok.   I turned VAD up to 5400 and low flows persisted.   I then turned patient onto his left side and low flows resolved.   Suspect kinking/clotting of outflow graft despite supratherapeutic INR.  D/w VAD coordinator.   Plan CT chest with contrast to evaluate outflow graft.   CCT 45 mins  Arvilla Meres, MD  7:15 PM

## 2020-01-14 NOTE — Progress Notes (Signed)
ANTICOAGULATION CONSULT NOTE - Follow Up Consult  Pharmacy Consult for warfarin Indication: LVAD  No Known Allergies  Patient Measurements: Height: 5\' 5"  (165.1 cm) Weight: 53.4 kg (117 lb 11.2 oz) IBW/kg (Calculated) : 61.5 Heparin Dosing Weight: 50.7 kg  Vital Signs: Temp: 98.6 F (37 C) (12/10 0733) Temp Source: Oral (12/10 0733) BP: 100/82 (12/10 0733) Pulse Rate: 62 (12/10 0826)  Labs: Recent Labs    01/12/20 2002 01/13/20 0009 01/13/20 0607 01/14/20 0018  HGB 7.3* 6.5* 8.5* 8.4*  HCT 20.9* 19.2* 25.1* 24.8*  PLT 71* 63*  --  55*  LABPROT 31.0* 36.0*  --  35.5*  INR 3.1* 3.8*  --  3.7*  CREATININE 1.27* 0.94  --  0.87    Estimated Creatinine Clearance: 90.4 mL/min (by C-G formula based on SCr of 0.87 mg/dL).   Medical History: Past Medical History:  Diagnosis Date  . Dilated cardiomyopathy (HCC)   . Dilated cardiomyopathy (HCC) 02/2019  . Polysubstance abuse (HCC)     Medications:  Scheduled:  . feeding supplement  237 mL Oral Once  . pantoprazole  40 mg Oral BID  . potassium chloride  40 mEq Oral Q4H    Assessment: 34 yom presenting after nausea, coughing up blood, and having low flows. Went to OSH - Hgb was 8, INR was elevated (received 2 units FFP).   PTA regimen is 5 mg daily except 2.5 mg MWF.   INR today  3.7 s/p PRBC and ensure drink and warfarin held since 12/7. Hgb low stable post PRBC 7.3>8.4, plt dropping 200s this summer 90 on admit down to 55 today - abdominal 05-06-1980 today, LDH bump 170>396 - recheck  Goal of Therapy:  INR 2-2.5 Monitor platelets by anticoagulation protocol: Yes   Plan:  Hold warfarin  Monitor daily INR, CBC, and for s/sx of bleeding    [place] Pharm.D. CPP, BCPS Clinical Pharmacist [phone] 01/14/2020 9:59 AM    Please check AMION for all [facility] Pharmacy phone numbers After 10:00 PM, call Main Pharmacy [phone]

## 2020-01-14 NOTE — Progress Notes (Signed)
Speed  Flow  PI  Power  LVIDD  AI  Aortic openings  MR  TR  Septum  RV   5200  3.2 5.3 3.6 4.06 none 5/5  mild mild Bow left normal    5100 2.8 3.5 6.9 4.1 none 5/5 mild mild Slight bow left mild                                                       Doppler MAP: 97/77 (85)   Ramp ECHO performed at bedside.  At completion of ramp study, patients primary and back up controller programmed:  Fixed speed: 5200 Low speed limit: 4900  Alyce Pagan RN VAD Coordinator  Office: [phone]  24/7 Pager: [phone]

## 2020-01-14 NOTE — Progress Notes (Signed)
   01/14/20 1135  Type of VAD  VAD Type HeartMate 3  HeartMate 3 VAD Equipment Check  Pump Speed (RPM) 5200 RPM  Pump Flow (LPM) 2.9  Power (Watts) 4 Watts  Pulsatility Index 6.5  Fixed Speed Limit 5200 rpm  Low Speed Limit 4900 rpm  Alarms Other (Comment) (low flow alarms. HF team aware. Orders received.)  Auscultated Normal expected humming  Patient Battery Source [facility] / Wall unit  Emergency Equipment at Bedside Yes

## 2020-01-14 NOTE — Progress Notes (Signed)
LVAD Coordinator Rounding Note:  Admitted 01/12/20 due bleeding from his mouth in the setting of supratherapuetic INR, low hgb and low flows.   HM III LVAD implanted on 02/26/19 by Dr Donata Clay under destination therapy criteria.  Pt awake in bed this am. BS nurse and CNA at bedside obtaining V/S. Doppler MAP 90.   Pt reports he feels good, but does not know if he will be going home today.  HM III flow 2.4 - 2.6 at this time; Hct adjusted back to 20. Pt says he has "drank a lot", and denies any symptoms of weakness, dizziness, or lightheadedness.   Vital signs: Temp: 98.6 HR: 73 Doppler Pressure: 90 Automatic BP:  110/82 (90) O2 Sat: 98% on RA Wt: 111.7>115.3>117.7 lbs  LVAD interrogation reveals:  Speed: 5200 Flow: 2.5 Power: 3.6 w PI: 6.8 - 7.6  Alarms: none  Events:  12 PI events yesterday Hematocrit: 20 - DO NOT ADJUST  Fixed speed: 5200 Low speed limit: 4900  Drive Line: Exit site maintained by patient weekly.Dressing C/D/I with anchor intact and accurately applied.  Next dressing change due 01/20/20 per bedside RN.   Labs:  LDH trend: 170>180>396  INR trend: 3.1>3.8>3.7  Anticoagulation Plan: -INR Goal: 2.0-2.5 -ASA Dose: none  Blood products: - 1u/PRBC 01/13/20  Adverse Events on VAD: - Admitted 08/02/19 due to N/V, and weakness. Creatinine elevated 2.7.   Plan/Recommendations:  1. Page VAD coordinator for any drive line or equipment issues 2. Weekly drive line dressing change per bedside RN 3. Bedside ramp echo scheduled today at 1:00 pm for low flow alarms this am. VAD Coordinator will be at bedside.    Jon Diener RN VAD Coordinator  Office: [phone]  24/7 Pager: [phone]

## 2020-01-14 NOTE — Progress Notes (Signed)
  Echocardiogram 2D Echocardiogram Ramp study  has been performed.  Jon Martin 01/14/2020, 1:31 PM

## 2020-01-14 NOTE — Progress Notes (Signed)
Pt had 2 low flow alarms while getting IV placed due to laying on his back. Once pt returned to left side alarms stopped.

## 2020-01-15 LAB — HEPATIC FUNCTION PANEL
ALT: 136 U/L — ABNORMAL HIGH (ref 0–44)
AST: 306 U/L — ABNORMAL HIGH (ref 15–41)
Albumin: 3.3 g/dL — ABNORMAL LOW (ref 3.5–5.0)
Alkaline Phosphatase: 59 U/L (ref 38–126)
Bilirubin, Direct: 0.4 mg/dL — ABNORMAL HIGH (ref 0.0–0.2)
Indirect Bilirubin: 0.9 mg/dL (ref 0.3–0.9)
Total Bilirubin: 1.3 mg/dL — ABNORMAL HIGH (ref 0.3–1.2)
Total Protein: 5.9 g/dL — ABNORMAL LOW (ref 6.5–8.1)

## 2020-01-15 LAB — MAGNESIUM: Magnesium: 1.3 mg/dL — ABNORMAL LOW (ref 1.7–2.4)

## 2020-01-15 LAB — CBC
HCT: 23.5 % — ABNORMAL LOW (ref 39.0–52.0)
Hemoglobin: 8.2 g/dL — ABNORMAL LOW (ref 13.0–17.0)
MCH: 32.8 pg (ref 26.0–34.0)
MCHC: 34.9 g/dL (ref 30.0–36.0)
MCV: 94 fL (ref 80.0–100.0)
Platelets: 59 10*3/uL — ABNORMAL LOW (ref 150–400)
RBC: 2.5 MIL/uL — ABNORMAL LOW (ref 4.22–5.81)
RDW: 16.4 % — ABNORMAL HIGH (ref 11.5–15.5)
WBC: 4.3 10*3/uL (ref 4.0–10.5)
nRBC: 0 % (ref 0.0–0.2)

## 2020-01-15 LAB — BASIC METABOLIC PANEL
Anion gap: 8 (ref 5–15)
BUN: <5 mg/dL — ABNORMAL LOW (ref 6–20)
CO2: 23 mmol/L (ref 22–32)
Calcium: 9.2 mg/dL (ref 8.9–10.3)
Chloride: 104 mmol/L (ref 98–111)
Creatinine, Ser: 0.67 mg/dL (ref 0.61–1.24)
GFR, Estimated: >60 mL/min (ref >60)
Glucose, Bld: 102 mg/dL — ABNORMAL HIGH (ref 70–99)
Potassium: 3.9 mmol/L (ref 3.5–5.1)
Sodium: 135 mmol/L (ref 135–145)

## 2020-01-15 LAB — LACTATE DEHYDROGENASE: LDH: 326 U/L — ABNORMAL HIGH (ref 98–192)

## 2020-01-15 LAB — PROTIME-INR
INR: 2.4 — ABNORMAL HIGH (ref 0.8–1.2)
Prothrombin Time: 25.2 seconds — ABNORMAL HIGH (ref 11.4–15.2)

## 2020-01-15 NOTE — Progress Notes (Signed)
Advanced Heart Failure VAD Team Note  PCP-Cardiologist: Dr. Gala Romney   Patient Profile   Jon Martin "[name] is from [facility] [place] with history of severe systolic HF due to NICM EF 15% and prior polysubstance abuse (cocaine and ETOH). 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   Admitted for ABLA (bleeding from mouth) in setting of supra therapeutic INR and had low flow alarms.   INR 8.0 on admit. Hgb 7.3>>6.5.    Subjective:    Received FFP + 1 unit of PRBCs on admit  Was having persistent low flows on VAD yesterday with rising LDH. No resolution with 2L IVF or increasing VAD speed. Alarms resolved with turning patient on his left side. CT with possible kink/occlusion of outflow graft  LDH 437 -> 387 -> 326  INR 2.4 Platelets 55K -59K Hgb 8.2  RUQ u/s: normal  LVAD INTERROGATION:  HeartMate III LVAD:   Flow 3.3 liters/min, speed 5400, power 4.0, PI 6.5. Multiple low flows last night  Objective:    Vital Signs:   Temp:  [98.5 F (36.9 C)-99.3 F (37.4 C)] 98.8 F (37.1 C) (12/11 0735) Pulse Rate:  [74-86] 74 (12/11 0314) Resp:  [16-19] 17 (12/11 0314) BP: (94-108)/(66-88) 95/83 (12/11 0826) SpO2:  [100 %] 100 % (12/11 0314) Weight:  [54.2 kg] 54.2 kg (12/11 0314) Last BM Date: 01/15/20 Mean arterial Pressure 80-90s  Intake/Output:   Intake/Output Summary (Last 24 hours) at 01/15/2020 0852 Last data filed at 01/15/2020 0735 Gross per 24 hour  Intake --  Output 4300 ml  Net -4300 ml     Physical Exam    General:  NAD.  HEENT: normal  Neck: supple. JVP not elevated.  Carotids 2+ bilat; no bruits. No lymphadenopathy or thryomegaly appreciated. Cor: LVAD hum.  Lungs: Clear. Abdomen: soft, nontender, non-distended. No hepatosplenomegaly. No bruits or masses. Good bowel sounds. Driveline site clean. Anchor in place.  Extremities: no cyanosis, clubbing, rash. Warm no edema  Neuro: alert & oriented x 3. No focal deficits. Moves all 4 without problem    Telemetry    NSR 60-70s Personally reviewed   Labs   Basic Metabolic Panel: Recent Labs  Lab 01/12/20 2002 01/13/20 0009 01/14/20 0018 01/15/20 0101  NA 134* 135 133* 135  K 3.2* 3.9 3.3* 3.9  CL 103 104 100 104  CO2 18* 22 23 23   GLUCOSE 149* 100* 127* 102*  BUN 14 12 <5* <5*  CREATININE 1.27* 0.94 0.87 0.67  CALCIUM 8.6* 9.0 8.6* 9.2  MG 1.6* 2.3  --  1.3*    Liver Function Tests: Recent Labs  Lab 01/12/20 2002 01/15/20 0101  AST 38 306*  ALT 24 136*  ALKPHOS 60 59  BILITOT 1.7* 1.3*  PROT 6.4* 5.9*  ALBUMIN 3.5 3.3*   No results for input(s): LIPASE, AMYLASE in the last 168 hours. No results for input(s): AMMONIA in the last 168 hours.  CBC: Recent Labs  Lab 01/12/20 2002 01/13/20 0009 01/13/20 0607 01/14/20 0018 01/14/20 1926 01/15/20 0101  WBC 11.3* 8.3  --  4.8  --  4.3  NEUTROABS 9.5*  --   --   --   --   --   HGB 7.3* 6.5* 8.5* 8.4* 8.1* 8.2*  HCT 20.9* 19.2* 25.1* 24.8* 24.6* 23.5*  MCV 97.7 98.5  --  94.7  --  94.0  PLT 71* 63*  --  55*  --  59*    INR: Recent Labs  Lab 01/12/20 2002 01/13/20 0009 01/14/20 0018 01/15/20  0101  INR 3.1* 3.8* 3.7* 2.4*    Other results:  EKG:    Imaging   CT ANGIO CHEST AORTA W/CM & OR WO/CM  Result Date: 01/14/2020 CLINICAL DATA:  Suspected LVAD kink/occlusion EXAM: CT ANGIOGRAPHY CHEST WITH CONTRAST TECHNIQUE: Multidetector CT imaging of the chest was performed using the standard protocol during bolus administration of intravenous contrast. Multiplanar CT image reconstructions and MIPs were obtained to evaluate the vascular anatomy. CONTRAST:  30mL OMNIPAQUE IOHEXOL 350 MG/ML SOLN COMPARISON:  None. FINDINGS: Cardiovascular: LVAD is in place. The graft extending from the left ventricular apex to the ascending thoracic aorta appears patent. Slight kinking and luminal narrowing of the graft as it crosses the wire then passes into the upper abdomen near the midline (series 5 image 78). Cardiomegaly. Aorta normal  caliber. No dissection. No filling defects in the pulmonary arteries to suggest pulmonary emboli. Mediastinum/Nodes: No mediastinal, hilar, or axillary adenopathy. Trachea and esophagus are unremarkable. Thyroid unremarkable. Lungs/Pleura: Lungs are clear. No focal airspace opacities or suspicious nodules. No effusions. Upper Abdomen: Imaging into the upper abdomen demonstrates no acute findings. Musculoskeletal: No acute bony abnormality. Review of the MIP images confirms the above findings. IMPRESSION: LVAD in place. The graft that extends from the left apex to the ascending thoracic aorta is patent. There appears to be slight luminal narrowing and kinking near the midline anteriorly as the graft crosses over the wire which passes into the upper abdomen. Mild cardiomegaly. Electronically Signed   By: Charlett Nose M.D.   On: 01/14/2020 21:43   ECHOCARDIOGRAM LIMITED  Result Date: 01/14/2020    ECHOCARDIOGRAM LIMITED REPORT   Patient Name:   Jon Martin Date of Exam: 01/14/2020 Medical Rec #:  [id number]       Height:       65.0 in Accession #:    [id number]      Weight:       117.7 lb Date of Birth:  1985-04-01        BSA:          1.579 m Patient Age:    34 years        BP:           106/88 mmHg Patient Gender: M               HR:           77 bpm. Exam Location:  Inpatient Procedure: Limited Echo and Limited Color Doppler Indications:    Congestive Heart Failure I50.9; Ramp Study  History:        Patient has prior history of Echocardiogram examinations, most                 recent 08/05/2019. CHF and Dilated Cardiomyopathy.  Sonographer:    Thurman Coyer RDCS (AE) Referring Phys: [id number] Roxy Horseman SIMMONS IMPRESSIONS  1. HeartMate 3 -- RPM 5100 and 5200     There is opening of aortic valve on both settings. Left ventricular ejection fraction, by estimation, is 25 to 30%. The left ventricle has severely decreased function.  2. Right ventricular systolic function is mildly reduced.  3. Mild to moderate  mitral valve regurgitation. FINDINGS  Left Ventricle: HeartMate 3 -- RPM 5100 and 5200 There is opening of aortic valve on both settings. Left ventricular ejection fraction, by estimation, is 25 to 30%. The left ventricle has severely decreased function. Right Ventricle: Right ventricular systolic function is mildly reduced. Mitral Valve: Mild to moderate mitral valve regurgitation. LEFT  VENTRICLE PLAX 2D LVIDd:         4.10 cm  Donato Schultz MD Electronically signed by Donato Schultz MD Signature Date/Time: 01/14/2020/4:33:01 PM    Final    US Abdomen Limited RUQ (LIVER/GB)  Result Date: 01/14/2020 CLINICAL DATA:  Alcohol abuse.  Thrombocytopenia. EXAM: ULTRASOUND ABDOMEN LIMITED RIGHT UPPER QUADRANT COMPARISON:  None. FINDINGS: Gallbladder: No gallstones or wall thickening visualized. No sonographic Murphy sign noted by sonographer. Common bile duct: Diameter: 3 mm Liver: No focal lesion identified. Within normal limits in parenchymal echogenicity. Portal vein is patent on color Doppler imaging with normal direction of blood flow towards the liver. Other: None. IMPRESSION: Unremarkable exam. Electronically Signed   By: Katherine Mantle M.D.   On: 01/14/2020 18:40     Medications:     Scheduled Medications: . pantoprazole  40 mg Oral BID    Infusions:   PRN Medications: acetaminophen, ondansetron (ZOFRAN) IV, white petrolatum    Assessment/Plan:     1. Acute blood loss anemia/ Thrombocytopenia  - clearly had source of bleeding on lower lip but drop in hgb seems out of proportion. ? Occult GI bleed.   - INR down to 2.4 - hgb was 8.4 after transfusion dropping slowly to 8.1. No melena or other evidence of active bleeding - continue to hold coumadin and ASA - May need repeat endoscopy if further drop in Hgb or if FOBT +  - MAPs stable in 80s-90s - continue PPI - peripheral smear no evidence of hemolysis - RUQ u/s normal - doubt splenic sequestration - suspect possible obstruction/clot  of outflow graft causing PLTs and HGb to drop (see below)  2. Persistent low flow alarms with rising LDH and falling PLTs - CT 12/10 concern for outflow graft kink.clot - Low flow alarms resolved with patient on left side - Ramp echo with adequate cannula position. Pump sounds fine on exam - Have d/w Dr. Llana Aliment who will review CT this am.  - Will d/w TCTS regarding next steps - INR 2.4. Start low-dose heparin - LDH now drifting back to normal  3. Supratherapeutic INR - INR 8 at [facility]   Given 2 units FFP prior to transfer.  - INR 2.4 today  4. Chronic Systolic HF due to NICM - Echo EF 10% with biventricular failure - HM-3 VAD implant 02/26/19 - Echo 7/21 EF 40-45% - Ramp echo 12/10 EF 35-40% - No bb or ARB with previous dehydration/RV failure.  - Hold sildenafil  - Volume status ok  - VAD management as above  5. Hypokalemia/hypomag - K 3.9 today  - Supp w/ KCL    I reviewed the LVAD parameters from today, and compared the results to the patient's prior recorded data.  No programming changes were made.  The LVAD is functioning within specified parameters.  The patient performs LVAD self-test daily.  LVAD interrogation was negative for any significant power changes, alarms or PI events/speed drops.  LVAD equipment check completed and is in good working order.  Back-up equipment present.   LVAD education done on emergency procedures and precautions and reviewed exit site care.  Length of Stay: 3  Arvilla Meres, MD 01/15/2020, 8:52 AM  VAD Team --- VAD ISSUES ONLY--- Pager [phone] (7am - 7am)  Advanced Heart Failure Team  Pager [phone] (M-F; 7a - 4p)  Please contact [facility] for night-coverage after hours (4p -7a ) and weekends on [email or web address]   8:52 AM

## 2020-01-15 NOTE — Plan of Care (Signed)

## 2020-01-16 DIAGNOSIS — T829XXD Unspecified complication of cardiac and vascular prosthetic device, implant and graft, subsequent encounter: Secondary | ICD-10-CM

## 2020-01-16 LAB — CBC
HCT: 24.9 % — ABNORMAL LOW (ref 39.0–52.0)
Hemoglobin: 8.5 g/dL — ABNORMAL LOW (ref 13.0–17.0)
MCH: 33.1 pg (ref 26.0–34.0)
MCHC: 34.1 g/dL (ref 30.0–36.0)
MCV: 96.9 fL (ref 80.0–100.0)
Platelets: 78 10*3/uL — ABNORMAL LOW (ref 150–400)
RBC: 2.57 MIL/uL — ABNORMAL LOW (ref 4.22–5.81)
RDW: 17.3 % — ABNORMAL HIGH (ref 11.5–15.5)
WBC: 5.7 10*3/uL (ref 4.0–10.5)
nRBC: 0 % (ref 0.0–0.2)

## 2020-01-16 LAB — BASIC METABOLIC PANEL
Anion gap: 10 (ref 5–15)
BUN: <5 mg/dL — ABNORMAL LOW (ref 6–20)
CO2: 22 mmol/L (ref 22–32)
Calcium: 9.5 mg/dL (ref 8.9–10.3)
Chloride: 102 mmol/L (ref 98–111)
Creatinine, Ser: 0.72 mg/dL (ref 0.61–1.24)
GFR, Estimated: >60 mL/min (ref >60)
Glucose, Bld: 125 mg/dL — ABNORMAL HIGH (ref 70–99)
Potassium: 3.7 mmol/L (ref 3.5–5.1)
Sodium: 134 mmol/L — ABNORMAL LOW (ref 135–145)

## 2020-01-16 LAB — HEPARIN LEVEL (UNFRACTIONATED): Heparin Unfractionated: 0.11 IU/mL — ABNORMAL LOW (ref 0.30–0.70)

## 2020-01-16 LAB — LACTATE DEHYDROGENASE: LDH: 233 U/L — ABNORMAL HIGH (ref 98–192)

## 2020-01-16 LAB — PROTIME-INR
INR: 1.3 — ABNORMAL HIGH (ref 0.8–1.2)
Prothrombin Time: 15.9 seconds — ABNORMAL HIGH (ref 11.4–15.2)

## 2020-01-16 MED ORDER — HEPARIN (PORCINE) 25000 UT/250ML-% IV SOLN
800.0000 [IU]/h | INTRAVENOUS | Status: DC
  Administered 2020-01-16: 600 [IU]/h via INTRAVENOUS
  Administered 2020-01-17: 800 [IU]/h via INTRAVENOUS
  Administered 2020-01-19: 900 [IU]/h via INTRAVENOUS
  Administered 2020-01-20 – 2020-01-21 (×2): 800 [IU]/h via INTRAVENOUS
  Filled 2020-01-16 (×5): qty 250

## 2020-01-16 MED ORDER — POTASSIUM CHLORIDE CRYS ER 20 MEQ PO TBCR
40.0000 meq | EXTENDED_RELEASE_TABLET | Freq: Once | ORAL | Status: AC
  Administered 2020-01-16: 40 meq via ORAL
  Filled 2020-01-16: qty 2

## 2020-01-16 MED ORDER — WARFARIN SODIUM 2.5 MG PO TABS
2.5000 mg | ORAL_TABLET | Freq: Once | ORAL | Status: AC
  Administered 2020-01-16: 2.5 mg via ORAL
  Filled 2020-01-16: qty 1

## 2020-01-16 MED ORDER — MAGNESIUM SULFATE 4 GM/100ML IV SOLN
4.0000 g | Freq: Once | INTRAVENOUS | Status: AC
  Administered 2020-01-16: 4 g via INTRAVENOUS
  Filled 2020-01-16: qty 100

## 2020-01-16 MED ORDER — WARFARIN - PHARMACIST DOSING INPATIENT: Freq: Every day | Status: DC

## 2020-01-16 NOTE — Progress Notes (Signed)
ANTICOAGULATION CONSULT NOTE - Follow Up Consult  Pharmacy Consult for Heparin>Warfarin Indication: LVAD  No Known Allergies  Patient Measurements: Height: 5\' 5"  (165.1 cm) Weight: 53.2 kg (117 lb 4.6 oz) IBW/kg (Calculated) : 61.5 Heparin Dosing Weight: 50.7 kg  Vital Signs: Temp: 99.1 F (37.3 C) (12/12 0715) Temp Source: Oral (12/12 0715) BP: 91/76 (12/12 1626)  Labs: Recent Labs    01/14/20 0018 01/14/20 1926 01/15/20 0101 01/16/20 0120 01/16/20 1615  HGB 8.4* 8.1* 8.2* 8.5*  --   HCT 24.8* 24.6* 23.5* 24.9*  --   PLT 55*  --  59* 78*  --   LABPROT 35.5*  --  25.2* 15.9*  --   INR 3.7*  --  2.4* 1.3*  --   HEPARINUNFRC  --   --   --   --  0.11*  CREATININE 0.87  --  0.67 0.72  --     Estimated Creatinine Clearance: 97.9 mL/min (by C-G formula based on SCr of 0.72 mg/dL).   Medical History: Past Medical History:  Diagnosis Date  . Dilated cardiomyopathy (HCC)   . Dilated cardiomyopathy (HCC) 02/2019  . Polysubstance abuse (HCC)     Medications:  Scheduled:  . pantoprazole  40 mg Oral BID  . Warfarin - Pharmacist Dosing Inpatient   Does not apply q1600    Assessment: 34 yom presenting after nausea, coughing up blood, and having low flows. Went to OSH - Hgb was 8, INR was elevated (received 2 units FFP).   PTA regimen is 5 mg daily except 2.5 mg MWF.   INR today down to 1.3 after holding warfarin this admit. Hgb low stable 8.5, plt improved to 79, abdominal 03/2019 negative, LDH bump 170>396 now trending back down to 233. D/w cardiology and will start heparin aiming for low end of goal and resume his warfarin.  -heparin level= 0.11  Goal of Therapy:  Heparin level goal 0.3-0.5>>aiming for ~0.3 INR 2-2.5 Monitor platelets by anticoagulation protocol: Yes   Plan:  Increase heparin 700 units/hr 6 hours heparin level Monitor daily INR, CBC, and heparin level  [place], PharmD Clinical Pharmacist **Pharmacist phone directory can now be found on  [email or web address] ([email or web address]).  Listed under [facility].

## 2020-01-16 NOTE — Plan of Care (Signed)

## 2020-01-16 NOTE — Progress Notes (Signed)
Patient ID: Jon Martin, male   DOB: January 05, 1986, 34 y.o.   MRN: [id number]   Advanced Heart Failure VAD Team Note  PCP-Cardiologist: Dr. Gala Romney   Patient Profile   [name] is from [facility] [place] with history of severe systolic HF due to NICM EF 15% and prior polysubstance abuse (cocaine and ETOH). 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   Admitted for ABLA (bleeding from mouth) in setting of supra therapeutic INR and had low flow alarms.   INR 8.0 on admit. Hgb 7.3>>6.5.    Subjective:    Received FFP + 1 unit of PRBCs on admit  Was having persistent low flows on VAD with rising LDH. No resolution with 2L IVF or increasing VAD speed. Alarms resolved with turning patient on his left side. CT with possible kink/occlusion of outflow graft, suspect this resolved with repositioning.   LDH 437 -> 387 -> 326 -> 233 INR 2.4 -> 1.3 Platelets 55K -59K -> 78 Hgb 8.2 -> 8.5  RUQ u/s: normal  LVAD INTERROGATION:  HeartMate III LVAD:   Flow 3.4 liters/min, speed 5400, power 3.9, PI 7.1. No further low flow alarms in the last 24 hrs, about 10 PI events this morning.   Objective:    Vital Signs:   Temp:  [98.9 F (37.2 C)-99.2 F (37.3 C)] 99.1 F (37.3 C) (12/12 0715) Pulse Rate:  [69-94] 94 (12/12 0329) Resp:  [15-20] 19 (12/12 0329) BP: (96-121)/(80-89) 105/86 (12/12 0757) SpO2:  [100 %] 100 % (12/12 0329) Weight:  [53.2 kg] 53.2 kg (12/12 0500) Last BM Date: 01/15/20 Mean arterial Pressure 80s-90s  Intake/Output:   Intake/Output Summary (Last 24 hours) at 01/16/2020 1149 Last data filed at 01/16/2020 0756 Gross per 24 hour  Intake 240 ml  Output 2000 ml  Net -1760 ml     Physical Exam    General: Well appearing this am. NAD.  HEENT: Normal. Neck: Supple, JVP 7-8 cm. Carotids OK.  Cardiac:  Mechanical heart sounds with LVAD hum present.  Lungs:  CTAB, normal effort.  Abdomen:  NT, ND, no HSM. No bruits or masses. +BS  LVAD exit site: Well-healed and  incorporated. Dressing dry and intact. No erythema or drainage. Stabilization device present and accurately applied. Driveline dressing changed daily per sterile technique. Extremities:  Warm and dry. No cyanosis, clubbing, rash, or edema.  Neuro:  Alert & oriented x 3. Cranial nerves grossly intact. Moves all 4 extremities w/o difficulty. Affect pleasant     Telemetry   NSR 60-70s Personally reviewed   Labs   Basic Metabolic Panel: Recent Labs  Lab 01/12/20 2002 01/13/20 0009 01/14/20 0018 01/15/20 0101 01/16/20 0120  NA 134* 135 133* 135 134*  K 3.2* 3.9 3.3* 3.9 3.7  CL 103 104 100 104 102  CO2 18* 22 23 23 22   GLUCOSE 149* 100* 127* 102* 125*  BUN 14 12 <5* <5* <5*  CREATININE 1.27* 0.94 0.87 0.67 0.72  CALCIUM 8.6* 9.0 8.6* 9.2 9.5  MG 1.6* 2.3  --  1.3*  --     Liver Function Tests: Recent Labs  Lab 01/12/20 2002 01/15/20 0101  AST 38 306*  ALT 24 136*  ALKPHOS 60 59  BILITOT 1.7* 1.3*  PROT 6.4* 5.9*  ALBUMIN 3.5 3.3*   No results for input(s): LIPASE, AMYLASE in the last 168 hours. No results for input(s): AMMONIA in the last 168 hours.  CBC: Recent Labs  Lab 01/12/20 2002 01/13/20 0009 01/13/20 7209 01/14/20 0018 01/14/20 1926  01/15/20 0101 01/16/20 0120  WBC 11.3* 8.3  --  4.8  --  4.3 5.7  NEUTROABS 9.5*  --   --   --   --   --   --   HGB 7.3* 6.5* 8.5* 8.4* 8.1* 8.2* 8.5*  HCT 20.9* 19.2* 25.1* 24.8* 24.6* 23.5* 24.9*  MCV 97.7 98.5  --  94.7  --  94.0 96.9  PLT 71* 63*  --  55*  --  59* 78*    INR: Recent Labs  Lab 01/12/20 2002 01/13/20 0009 01/14/20 0018 01/15/20 0101 01/16/20 0120  INR 3.1* 3.8* 3.7* 2.4* 1.3*    Other results:  EKG:    Imaging   CT ANGIO CHEST AORTA W/CM & OR WO/CM  Result Date: 01/14/2020 CLINICAL DATA:  Suspected LVAD kink/occlusion EXAM: CT ANGIOGRAPHY CHEST WITH CONTRAST TECHNIQUE: Multidetector CT imaging of the chest was performed using the standard protocol during bolus administration of  intravenous contrast. Multiplanar CT image reconstructions and MIPs were obtained to evaluate the vascular anatomy. CONTRAST:  34mL OMNIPAQUE IOHEXOL 350 MG/ML SOLN COMPARISON:  None. FINDINGS: Cardiovascular: LVAD is in place. The graft extending from the left ventricular apex to the ascending thoracic aorta appears patent. Slight kinking and luminal narrowing of the graft as it crosses the wire then passes into the upper abdomen near the midline (series 5 image 78). Cardiomegaly. Aorta normal caliber. No dissection. No filling defects in the pulmonary arteries to suggest pulmonary emboli. Mediastinum/Nodes: No mediastinal, hilar, or axillary adenopathy. Trachea and esophagus are unremarkable. Thyroid unremarkable. Lungs/Pleura: Lungs are clear. No focal airspace opacities or suspicious nodules. No effusions. Upper Abdomen: Imaging into the upper abdomen demonstrates no acute findings. Musculoskeletal: No acute bony abnormality. Review of the MIP images confirms the above findings. IMPRESSION: LVAD in place. The graft that extends from the left apex to the ascending thoracic aorta is patent. There appears to be slight luminal narrowing and kinking near the midline anteriorly as the graft crosses over the wire which passes into the upper abdomen. Mild cardiomegaly. Electronically Signed   By: Charlett Nose M.D.   On: 01/14/2020 21:43   ECHOCARDIOGRAM LIMITED  Result Date: 01/14/2020    ECHOCARDIOGRAM LIMITED REPORT   Patient Name:   Jon Martin Date of Exam: 01/14/2020 Medical Rec #:  [id number]       Height:       65.0 in Accession #:    [id number]      Weight:       117.7 lb Date of Birth:  1985/08/28        BSA:          1.579 m Patient Age:    34 years        BP:           106/88 mmHg Patient Gender: M               HR:           77 bpm. Exam Location:  Inpatient Procedure: Limited Echo and Limited Color Doppler Indications:    Congestive Heart Failure I50.9; Ramp Study  History:        Patient has prior  history of Echocardiogram examinations, most                 recent 08/05/2019. CHF and Dilated Cardiomyopathy.  Sonographer:    Thurman Coyer RDCS (AE) Referring Phys: [id number] Roxy Horseman SIMMONS IMPRESSIONS  1. HeartMate 3 -- RPM 5100 and 5200  There is opening of aortic valve on both settings. Left ventricular ejection fraction, by estimation, is 25 to 30%. The left ventricle has severely decreased function.  2. Right ventricular systolic function is mildly reduced.  3. Mild to moderate mitral valve regurgitation. FINDINGS  Left Ventricle: HeartMate 3 -- RPM 5100 and 5200 There is opening of aortic valve on both settings. Left ventricular ejection fraction, by estimation, is 25 to 30%. The left ventricle has severely decreased function. Right Ventricle: Right ventricular systolic function is mildly reduced. Mitral Valve: Mild to moderate mitral valve regurgitation. LEFT VENTRICLE PLAX 2D LVIDd:         4.10 cm  Donato Schultz MD Electronically signed by Donato Schultz MD Signature Date/Time: 01/14/2020/4:33:01 PM    Final    US Abdomen Limited RUQ (LIVER/GB)  Result Date: 01/14/2020 CLINICAL DATA:  Alcohol abuse.  Thrombocytopenia. EXAM: ULTRASOUND ABDOMEN LIMITED RIGHT UPPER QUADRANT COMPARISON:  None. FINDINGS: Gallbladder: No gallstones or wall thickening visualized. No sonographic Murphy sign noted by sonographer. Common bile duct: Diameter: 3 mm Liver: No focal lesion identified. Within normal limits in parenchymal echogenicity. Portal vein is patent on color Doppler imaging with normal direction of blood flow towards the liver. Other: None. IMPRESSION: Unremarkable exam. Electronically Signed   By: Katherine Mantle M.D.   On: 01/14/2020 18:40     Medications:     Scheduled Medications: . pantoprazole  40 mg Oral BID    Infusions: . heparin 600 Units/hr (01/16/20 0935)    PRN Medications: acetaminophen, ondansetron (ZOFRAN) IV, white petrolatum    Assessment/Plan:     1. Anemia/  Thrombocytopenia  - clearly had source of bleeding on lower lip but drop in hgb seems out of proportion. ?Hemolysis from kinking in graft.   - INR down to 1.3, on heparin gtt.  - hgb now up to 8.5. No melena or other evidence of active bleeding - Platelets rising.  - Can restart warfarin today.  - MAPs stable in 80s-90s - continue PPI - peripheral smear no evidence of hemolysis - RUQ u/s normal - doubt splenic sequestration - suspect possible obstruction/clot of outflow graft causing PLTs and HGb to drop, now resolved (see below)  2. Persistent low flow alarms with rising LDH and falling PLTs - CT 12/10 concern for outflow graft kink/clot - Low flow alarms resolved with patient on left side - Ramp echo with adequate cannula position. Pump sounds fine on exam - Suspect resolution of kinking with repositioning.  No further low flow alarms. LDH back to baseline at 233.  - Continue heparin gtt, restart warfarin.   3. Supratherapeutic INR - INR 8 at [facility]   Given 2 units FFP prior to transfer.  - INR 1.3 today, restart warfarin.   4. Chronic Systolic HF due to NICM - Echo EF 10% with biventricular failure - HM-3 VAD implant 02/26/19 - Echo 7/21 EF 40-45% - Ramp echo 12/10 EF 35-40% - No bb or ARB with previous dehydration/RV failure.  - Hold sildenafil  - Volume status ok  - VAD management as above  5. Hypokalemia/hypomag - K 3.7 today  - Supp w/ KCL    I reviewed the LVAD parameters from today, and compared the results to the patient's prior recorded data.  No programming changes were made.  The LVAD is functioning within specified parameters.  The patient performs LVAD self-test daily.  LVAD interrogation was negative for any significant power changes, alarms or PI events/speed drops.  LVAD equipment check completed  and is in good working order.  Back-up equipment present.   LVAD education done on emergency procedures and precautions and reviewed exit site  care.  Length of Stay: 4  Marca Ancona, MD 01/16/2020, 11:49 AM  VAD Team --- VAD ISSUES ONLY--- Pager [phone] (7am - 7am)  Advanced Heart Failure Team  Pager [phone] (M-F; 7a - 4p)  Please contact [facility] for night-coverage after hours (4p -7a ) and weekends on [email or web address]

## 2020-01-16 NOTE — Progress Notes (Signed)
ANTICOAGULATION CONSULT NOTE - Follow Up Consult  Pharmacy Consult for Heparin>Warfarin Indication: LVAD  No Known Allergies  Patient Measurements: Height: 5\' 5"  (165.1 cm) Weight: 53.2 kg (117 lb 4.6 oz) IBW/kg (Calculated) : 61.5 Heparin Dosing Weight: 50.7 kg  Vital Signs: Temp: 99.1 F (37.3 C) (12/12 0715) Temp Source: Oral (12/12 0715) BP: 96/81 (12/12 1219) Pulse Rate: 94 (12/12 0329)  Labs: Recent Labs    01/14/20 0018 01/14/20 1926 01/15/20 0101 01/16/20 0120  HGB 8.4* 8.1* 8.2* 8.5*  HCT 24.8* 24.6* 23.5* 24.9*  PLT 55*  --  59* 78*  LABPROT 35.5*  --  25.2* 15.9*  INR 3.7*  --  2.4* 1.3*  CREATININE 0.87  --  0.67 0.72    Estimated Creatinine Clearance: 97.9 mL/min (by C-G formula based on SCr of 0.72 mg/dL).   Medical History: Past Medical History:  Diagnosis Date  . Dilated cardiomyopathy (HCC)   . Dilated cardiomyopathy (HCC) 02/2019  . Polysubstance abuse (HCC)     Medications:  Scheduled:  . pantoprazole  40 mg Oral BID    Assessment: 34 yom presenting after nausea, coughing up blood, and having low flows. Went to OSH - Hgb was 8, INR was elevated (received 2 units FFP).   PTA regimen is 5 mg daily except 2.5 mg MWF.   INR today down to 1.3 after holding warfarin this admit. Hgb low stable 8.5, plt improved to 79, abdominal 03/2019 negative, LDH bump 170>396 now trending back down to 233. D/w cardiology and will start heparin aiming for low end of goal and resume his warfarin.   Goal of Therapy:  Heparin level goal 0.3-0.5>>aiming for ~0.3 INR 2-2.5 Monitor platelets by anticoagulation protocol: Yes   Plan:  Start heparin at 600 units/hr  6 hours heparin level Warfarin 2.5mg  tonight Monitor daily INR, CBC, and heparin level   [place] PharmD., BCPS Clinical Pharmacist 01/16/2020 12:31 PM

## 2020-01-17 LAB — BASIC METABOLIC PANEL
Anion gap: 9 (ref 5–15)
BUN: <5 mg/dL — ABNORMAL LOW (ref 6–20)
CO2: 25 mmol/L (ref 22–32)
Calcium: 9 mg/dL (ref 8.9–10.3)
Chloride: 101 mmol/L (ref 98–111)
Creatinine, Ser: 0.69 mg/dL (ref 0.61–1.24)
GFR, Estimated: >60 mL/min (ref >60)
Glucose, Bld: 100 mg/dL — ABNORMAL HIGH (ref 70–99)
Potassium: 3.6 mmol/L (ref 3.5–5.1)
Sodium: 135 mmol/L (ref 135–145)

## 2020-01-17 LAB — HEPATIC FUNCTION PANEL
ALT: 82 U/L — ABNORMAL HIGH (ref 0–44)
AST: 88 U/L — ABNORMAL HIGH (ref 15–41)
Albumin: 3.3 g/dL — ABNORMAL LOW (ref 3.5–5.0)
Alkaline Phosphatase: 75 U/L (ref 38–126)
Bilirubin, Direct: 0.2 mg/dL (ref 0.0–0.2)
Indirect Bilirubin: 0.6 mg/dL (ref 0.3–0.9)
Total Bilirubin: 0.8 mg/dL (ref 0.3–1.2)
Total Protein: 6.2 g/dL — ABNORMAL LOW (ref 6.5–8.1)

## 2020-01-17 LAB — CBC
HCT: 25 % — ABNORMAL LOW (ref 39.0–52.0)
Hemoglobin: 8.5 g/dL — ABNORMAL LOW (ref 13.0–17.0)
MCH: 33.2 pg (ref 26.0–34.0)
MCHC: 34 g/dL (ref 30.0–36.0)
MCV: 97.7 fL (ref 80.0–100.0)
Platelets: 124 10*3/uL — ABNORMAL LOW (ref 150–400)
RBC: 2.56 MIL/uL — ABNORMAL LOW (ref 4.22–5.81)
RDW: 18.4 % — ABNORMAL HIGH (ref 11.5–15.5)
WBC: 7.1 10*3/uL (ref 4.0–10.5)
nRBC: 0 % (ref 0.0–0.2)

## 2020-01-17 LAB — HEPARIN LEVEL (UNFRACTIONATED)
Heparin Unfractionated: 0.15 IU/mL — ABNORMAL LOW (ref 0.30–0.70)
Heparin Unfractionated: 0.18 IU/mL — ABNORMAL LOW (ref 0.30–0.70)

## 2020-01-17 LAB — LACTATE DEHYDROGENASE: LDH: 224 U/L — ABNORMAL HIGH (ref 98–192)

## 2020-01-17 LAB — MAGNESIUM: Magnesium: 1.9 mg/dL (ref 1.7–2.4)

## 2020-01-17 LAB — PROTIME-INR
INR: 1.1 (ref 0.8–1.2)
Prothrombin Time: 13.6 seconds (ref 11.4–15.2)

## 2020-01-17 MED ORDER — POTASSIUM CHLORIDE CRYS ER 20 MEQ PO TBCR
40.0000 meq | EXTENDED_RELEASE_TABLET | Freq: Every day | ORAL | Status: DC
  Administered 2020-01-17 – 2020-01-21 (×5): 40 meq via ORAL
  Filled 2020-01-17 (×5): qty 2

## 2020-01-17 MED ORDER — SODIUM CHLORIDE 0.9 % IV BOLUS
500.0000 mL | Freq: Once | INTRAVENOUS | Status: AC
  Administered 2020-01-17: 500 mL via INTRAVENOUS

## 2020-01-17 MED ORDER — MAGNESIUM SULFATE 2 GM/50ML IV SOLN
2.0000 g | Freq: Once | INTRAVENOUS | Status: AC
  Administered 2020-01-17: 2 g via INTRAVENOUS
  Filled 2020-01-17: qty 50

## 2020-01-17 MED ORDER — WARFARIN SODIUM 5 MG PO TABS
5.0000 mg | ORAL_TABLET | Freq: Once | ORAL | Status: AC
  Administered 2020-01-17: 5 mg via ORAL
  Filled 2020-01-17: qty 1

## 2020-01-17 MED ORDER — METOPROLOL TARTRATE 12.5 MG HALF TABLET: 12.5000 mg | ORAL_TABLET | Freq: Two times a day (BID) | ORAL | Status: DC

## 2020-01-17 MED ORDER — IVABRADINE HCL 5 MG PO TABS
5.0000 mg | ORAL_TABLET | Freq: Two times a day (BID) | ORAL | Status: DC
  Administered 2020-01-17 – 2020-01-21 (×8): 5 mg via ORAL
  Filled 2020-01-17 (×9): qty 1

## 2020-01-17 NOTE — Progress Notes (Signed)
ANTICOAGULATION CONSULT NOTE - Follow Up Consult  Pharmacy Consult for Heparin>Warfarin Indication: LVAD  No Known Allergies  Patient Measurements: Height: 5\' 5"  (165.1 cm) Weight: 53.7 kg (118 lb 6.2 oz) IBW/kg (Calculated) : 61.5 Heparin Dosing Weight: 50.7 kg  Vital Signs: Temp: 98.2 F (36.8 C) (12/13 1113) Temp Source: Oral (12/13 1113) BP: 98/68 (12/13 1113) Pulse Rate: 100 (12/13 1113)  Labs: Recent Labs    01/15/20 0101 01/16/20 0120 01/16/20 1615 01/17/20 0105 01/17/20 0748  HGB 8.2* 8.5*  --  8.5*  --   HCT 23.5* 24.9*  --  25.0*  --   PLT 59* 78*  --  124*  --   LABPROT 25.2* 15.9*  --  13.6  --   INR 2.4* 1.3*  --  1.1  --   HEPARINUNFRC  --   --  0.11* 0.15* 0.18*  CREATININE 0.67 0.72  --  0.69  --     Estimated Creatinine Clearance: 98.8 mL/min (by C-G formula based on SCr of 0.69 mg/dL).   Medical History: Past Medical History:  Diagnosis Date   Dilated cardiomyopathy (HCC)    Dilated cardiomyopathy (HCC) 02/2019   Polysubstance abuse (HCC)     Medications:  Scheduled:   pantoprazole  40 mg Oral BID   potassium chloride  40 mEq Oral Daily   warfarin  5 mg Oral ONCE-1600   Warfarin - Pharmacist Dosing Inpatient   Does not apply q1600    Assessment: 34 yom presenting after nausea, coughing up blood, and having low flows. Went to OSH - Hgb was 8, INR was elevated (received 2 units FFP).   PTA regimen is 5 mg daily except 2.5 mg MWF.   INR today down to 1.1 after holding warfarin this admit. Hgb low stable 8.5, plt improved 55>120s, abdominal 03/2019 negative, LDH bump 170>400>200s down trending. Now on low dose heparin drip 800 uts/hr  aiming for low end of goal HL 0.18 and resume his warfarin. INR 1.1  Goal of Therapy:  Heparin level goal 0.3-0.5>>aiming for ~0.3 INR 2-2.5 Monitor platelets by anticoagulation protocol: Yes   Plan:  Continue Heparin 800 uts/hr  Warfarin 5mg  tonight Monitor daily INR, CBC, and heparin  level    [place] Pharm.D. CPP, BCPS Clinical Pharmacist [phone] 01/17/2020 1:47 PM

## 2020-01-17 NOTE — Discharge Instructions (Signed)
Information on my medicine - Coumadin®   (Warfarin) ° °This medication education was reviewed with me or my healthcare representative as part of my discharge preparation.   ° °Why was Coumadin prescribed for you? °Coumadin was prescribed for you because you have a blood clot or a medical condition that can cause an increased risk of forming blood clots. Blood clots can cause serious health problems by blocking the flow of blood to the heart, lung, or brain. Coumadin can prevent harmful blood clots from forming. °As a reminder your indication for Coumadin is:   Blood Clot Prevention After Heart Pump Surgery ° °What test will check on my response to Coumadin? °While on Coumadin (warfarin) you will need to have an INR test regularly to ensure that your dose is keeping you in the desired range. The INR (international normalized ratio) number is calculated from the result of the laboratory test called prothrombin time (PT). ° °If an INR APPOINTMENT HAS NOT ALREADY BEEN MADE FOR YOU please schedule an appointment to have this lab work done by your health care provider within 7 days. °Your INR goal is a number between:  2-2.5  ° °What  do you need to  know  About  COUMADIN? °Take Coumadin (warfarin) exactly as prescribed by your healthcare provider about the same time each day.  DO NOT stop taking without talking to the doctor who prescribed the medication.  Stopping without other blood clot prevention medication to take the place of Coumadin may increase your risk of developing a new clot or stroke.  Get refills before you run out. ° °What do you do if you miss a dose? °If you miss a dose, take it as soon as you remember on the same day then continue your regularly scheduled regimen the next day.  Do not take two doses of Coumadin at the same time. ° °Important Safety Information °A possible side effect of Coumadin (Warfarin) is an increased risk of bleeding. You should call your healthcare provider right away if you  experience any of the following: °? Bleeding from an injury or your nose that does not stop. °? Unusual colored urine (red or dark brown) or unusual colored stools (red or black). °? Unusual bruising for unknown reasons. °? A serious fall or if you hit your head (even if there is no bleeding). ° °Some foods or medicines interact with Coumadin® (warfarin) and might alter your response to warfarin. To help avoid this: °? Eat a balanced diet, maintaining a consistent amount of Vitamin K. °? Notify your provider about major diet changes you plan to make. °? Avoid alcohol or limit your intake to 1 drink for women and 2 drinks for men per day. °(1 drink is 5 oz. wine, 12 oz. beer, or 1.5 oz. liquor.) ° °Make sure that ANY health care provider who prescribes medication for you knows that you are taking Coumadin (warfarin).  Also make sure the healthcare provider who is monitoring your Coumadin knows when you have started a new medication including herbals and non-prescription products. ° °Coumadin® (Warfarin)  Major Drug Interactions  °Increased Warfarin Effect Decreased Warfarin Effect  °Alcohol (large quantities) °Antibiotics (esp. Septra/Bactrim, Flagyl, Cipro) °Amiodarone (Cordarone) °Aspirin (ASA) °Cimetidine (Tagamet) °Megestrol (Megace) °NSAIDs (ibuprofen, naproxen, etc.) °Piroxicam (Feldene) °Propafenone (Rythmol SR) °Propranolol (Inderal) °Isoniazid (INH) °Posaconazole (Noxafil) Barbiturates (Phenobarbital) °Carbamazepine (Tegretol) °Chlordiazepoxide (Librium) °Cholestyramine (Questran) °Griseofulvin °Oral Contraceptives °Rifampin °Sucralfate (Carafate) °Vitamin K  ° °Coumadin® (Warfarin) Major Herbal Interactions  °Increased Warfarin Effect Decreased Warfarin Effect  °  Garlic °Ginseng °Ginkgo biloba Coenzyme Q10 °Green tea °St. John’s wort   ° °Coumadin® (Warfarin) FOOD Interactions  °Eat a consistent number of servings per week of foods HIGH in Vitamin K °(1 serving = ½ cup)  °Collards (cooked, or boiled &  drained) °Kale (cooked, or boiled & drained) °Mustard greens (cooked, or boiled & drained) °Parsley *serving size only = ¼ cup °Spinach (cooked, or boiled & drained) °Swiss chard (cooked, or boiled & drained) °Turnip greens (cooked, or boiled & drained)  °Eat a consistent number of servings per week of foods MEDIUM-HIGH in Vitamin K °(1 serving = 1 cup)  °Asparagus (cooked, or boiled & drained) °Broccoli (cooked, boiled & drained, or raw & chopped) °Brussel sprouts (cooked, or boiled & drained) *serving size only = ½ cup °Lettuce, raw (green leaf, endive, romaine) °Spinach, raw °Turnip greens, raw & chopped  ° °These websites have more information on Coumadin (warfarin):  www.coumadin.com; °www.ahrq.gov/consumer/coumadin.htm; ° ° ° °

## 2020-01-17 NOTE — Progress Notes (Signed)
ANTICOAGULATION CONSULT NOTE - Follow Up Consult  Pharmacy Consult for Heparin>Warfarin Indication: LVAD  Recent Labs    01/15/20 0101 01/16/20 0120 01/16/20 1615 01/17/20 0105  HGB 8.2* 8.5*  --  8.5*  HCT 23.5* 24.9*  --  25.0*  PLT 59* 78*  --  124*  LABPROT 25.2* 15.9*  --  13.6  INR 2.4* 1.3*  --  1.1  HEPARINUNFRC  --   --  0.11* 0.15*  CREATININE 0.67 0.72  --   --     Assessment: 34 yom presenting after nausea, coughing up blood, and having low flows. Went to OSH - Hgb was 8, INR was elevated (received 2 units FFP).   PTA regimen is 5 mg daily except 2.5 mg MWF.   INR today down to 1.3 after holding warfarin this admit. Hgb low stable 8.5, plt improved to 79, abdominal US negative, LDH bump 170>396 now trending back down to 233. D/w cardiology and will start heparin aiming for low end of goal and resume his warfarin.  -heparin level= 0.15  Goal of Therapy:  Heparin level goal 0.3-0.5>>aiming for ~0.3 INR 2-2.5 Monitor platelets by anticoagulation protocol: Yes   Plan:  Increase heparin 800 units/hr 6 hours heparin level Monitor daily INR, CBC, and heparin level  Thanks for allowing pharmacy to be a part of this patient's care.  Talbert Cage, PharmD Clinical Pharmacist

## 2020-01-17 NOTE — Progress Notes (Addendum)
Patient ID: Jeston Junkins, male   DOB: Aug 16, 1985, 34 y.o.   MRN: [id number]   Advanced Heart Failure VAD Team Note  PCP-Cardiologist: Dr. Gala Romney   Patient Profile   Mr Granlund "[name] is from [facility] [place] with history of severe systolic HF due to NICM EF 15% and prior polysubstance abuse (cocaine and ETOH). 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   Admitted for ABLA (bleeding from mouth) in setting of supra therapeutic INR and had low flow alarms.   INR 8.0 on admit. Hgb 7.3>>6.5.    Subjective:    Received FFP + 1 unit of PRBCs on admit  Was having persistent low flows on VAD with rising LDH. No resolution with 2L IVF or increasing VAD speed. Alarms resolved with turning patient on his left side. CT with possible kink/occlusion of outflow graft, suspect this resolved with repositioning.   LDH 437 -> 387 -> 326 -> 233 INR 2.4 -> 1.3-->1.1 On heparin drip + coumadin.  Platelets 55K -59K -> 78->124 Hgb 8.2 -> 8.5->8.5   RUQ u/s: normal  Denies SOB. Denies N/V.    LVAD INTERROGATION:  HeartMate III LVAD:   Flow 3.7 liters/min, speed 5400, power 4, PI 4.7 - 47 PI events. No low flow alarms.    Objective:    Vital Signs:   Temp:  [98 F (36.7 C)-99.4 F (37.4 C)] 98.7 F (37.1 C) (12/13 0351) Pulse Rate:  [74-100] 97 (12/13 0351) Resp:  [16-22] 20 (12/12 2301) BP: (91-105)/(76-86) 94/83 (12/13 0351) SpO2:  [90 %-100 %] 97 % (12/13 0351) Weight:  [53.7 kg] 53.7 kg (12/13 0351) Last BM Date: 01/16/20 Mean arterial Pressure 80s  Intake/Output:   Intake/Output Summary (Last 24 hours) at 01/17/2020 0719 Last data filed at 01/17/2020 0400 Gross per 24 hour  Intake 1307.84 ml  Output 1400 ml  Net -92.16 ml     Physical Exam    Physical Exam: GENERAL: No acute distress. HEENT: normal  NECK: Supple, JVP flat  .  2+ bilaterally, no bruits.  No lymphadenopathy or thyromegaly appreciated.   CARDIAC:  Mechanical heart sounds with LVAD hum present.  LUNGS:  Clear to  auscultation bilaterally.  ABDOMEN:  Soft, round, nontender, positive bowel sounds x4.     LVAD exit site:  Dressing dry and intact.  No erythema or drainage.  Stabilization device present and accurately applied.  Driveline dressing is being changed daily per sterile technique. EXTREMITIES:  Warm and dry, no cyanosis, clubbing, rash or edema  NEUROLOGIC:  Alert and oriented x 3.    No aphasia.  No dysarthria.  Affect pleasant.      Telemetry   NSR/ST  80-100s     Labs   Basic Metabolic Panel: Recent Labs  Lab 01/12/20 2002 01/13/20 0009 01/14/20 0018 01/15/20 0101 01/16/20 0120 01/17/20 0105  NA 134* 135 133* 135 134* 135  K 3.2* 3.9 3.3* 3.9 3.7 3.6  CL 103 104 100 104 102 101  CO2 18* 22 23 23 22 25   GLUCOSE 149* 100* 127* 102* 125* 100*  BUN 14 12 <5* <5* <5* <5*  CREATININE 1.27* 0.94 0.87 0.67 0.72 0.69  CALCIUM 8.6* 9.0 8.6* 9.2 9.5 9.0  MG 1.6* 2.3  --  1.3*  --  1.9    Liver Function Tests: Recent Labs  Lab 01/12/20 2002 01/15/20 0101  AST 38 306*  ALT 24 136*  ALKPHOS 60 59  BILITOT 1.7* 1.3*  PROT 6.4* 5.9*  ALBUMIN 3.5 3.3*   No  results for input(s): LIPASE, AMYLASE in the last 168 hours. No results for input(s): AMMONIA in the last 168 hours.  CBC: Recent Labs  Lab 01/12/20 2002 01/13/20 0009 01/13/20 0607 01/14/20 0018 01/14/20 1926 01/15/20 0101 01/16/20 0120 01/17/20 0105  WBC 11.3* 8.3  --  4.8  --  4.3 5.7 7.1  NEUTROABS 9.5*  --   --   --   --   --   --   --   HGB 7.3* 6.5*   < > 8.4* 8.1* 8.2* 8.5* 8.5*  HCT 20.9* 19.2*   < > 24.8* 24.6* 23.5* 24.9* 25.0*  MCV 97.7 98.5  --  94.7  --  94.0 96.9 97.7  PLT 71* 63*  --  55*  --  59* 78* 124*   < > = values in this interval not displayed.    INR: Recent Labs  Lab 01/13/20 0009 01/14/20 0018 01/15/20 0101 01/16/20 0120 01/17/20 0105  INR 3.8* 3.7* 2.4* 1.3* 1.1    Other results:  EKG:    Imaging   No results found.   Medications:     Scheduled Medications: .  pantoprazole  40 mg Oral BID  . Warfarin - Pharmacist Dosing Inpatient   Does not apply q1600    Infusions: . heparin 800 Units/hr (01/17/20 0400)    PRN Medications: acetaminophen, ondansetron (ZOFRAN) IV, white petrolatum    Assessment/Plan:     1. Anemia/ Thrombocytopenia  - clearly had source of bleeding on lower lip but drop in hgb seems out of proportion. ?Hemolysis from kinking in graft.   - INR down to 1.1, on heparin gtt + warfarin.   - hgb stable at 8.5 . No melena or other evidence of active bleeding - Platelets trending up.   - continue PPI - peripheral smear no evidence of hemolysis - RUQ u/s normal - doubt splenic sequestration - suspect possible obstruction/clot of outflow graft causing PLTs and HGb to drop, now resolved (see below)  2. Persistent low flow alarms with rising LDH and falling PLTs - CT 12/10 concern for outflow graft kink/clot - Low flow alarms resolved with patient on left side - Ramp echo with adequate cannula position. Pump sounds fine on exam - Suspect resolution of kinking with repositioning.  No further low flow alarms. LDH back to baseline at 224   - Continue heparin gtt, back on warfarin.   3. Supratherapeutic INR - INR 8 at [facility]   Given 2 units FFP prior to transfer.  - INR now subtherapeutic. Resolved as above.   4. Chronic Systolic HF due to NICM - Echo EF 10% with biventricular failure - HM-3 VAD implant 02/26/19 - Echo 7/21 EF 40-45% - Ramp echo 12/10 EF 35-40% - Maps stable. 80s.  - No bb or ARB with previous dehydration/RV failure.  - Off sildenafil  - Volume status stable.  - VAD management as above  5. Hypokalemia/hypomag - K 3.6 -Mag 1.9 Supp K and give 2 grams Mag.    I reviewed the LVAD parameters from today, and compared the results to the patient's prior recorded data.  No programming changes were made.  The LVAD is functioning within specified parameters.  The patient performs LVAD self-test daily.   LVAD interrogation was negative for any significant power changes, alarms or PI events/speed drops.  LVAD equipment check completed and is in good working order.  Back-up equipment present.   LVAD education done on emergency procedures and precautions and reviewed exit site  care.  Length of Stay: 5  [name], NP 01/17/2020, 7:19 AM  VAD Team --- VAD ISSUES ONLY--- Pager [phone] (7am - 7am)  Advanced Heart Failure Team  Pager [phone] (M-F; 7a - 4p)  Please contact [facility] for night-coverage after hours (4p -7a ) and weekends on [email or web address]   Patient seen and examined with the above-signed Advanced Practice Provider and/or Housestaff. I personally reviewed laboratory data, imaging studies and relevant notes. I independently examined the patient and formulated the important aspects of the plan. I have edited the note to reflect any of my changes or salient points. I have personally discussed the plan with the patient and/or family.  Looks much better. Low flows have resolved. HGb stable. LDH trending back down. INR low. On heparin and warfarin.   General:  NAD.  HEENT: normal  Neck: supple. JVP not elevated.  Carotids 2+ bilat; no bruits. No lymphadenopathy or thryomegaly appreciated. Cor: LVAD hum.  Lungs: Clear. Abdomen: soft, nontender, non-distended. No hepatosplenomegaly. No bruits or masses. Good bowel sounds. Driveline site clean. Anchor in place.  Extremities: no cyanosis, clubbing, rash. Warm no edema  Neuro: alert & oriented x 3. No focal deficits. Moves all 4 without problem  Suspect transient kink in LVAD outflow tract graft now resolved. CT reviewed with Radiology and looks fine.   Would continue to load warfarin with heparin bridge. INR goal 2.0-2.5. Watch hgb. Recent endoscopy ok. Will not repeat unless he rebleeds. Discussed dosing with PharmD personally. VAD interrogated personally. Parameters stable.   Arvilla Meres, MD  1:24 PM

## 2020-01-17 NOTE — Plan of Care (Signed)
  Problem: Education: Goal: Knowledge of General Education information will improve Description: Including pain rating scale, medication(s)/side effects and non-pharmacologic comfort measures Outcome: Progressing   Problem: Health Behavior/Discharge Planning: Goal: Ability to manage health-related needs will improve Outcome: Progressing   Problem: Clinical Measurements: Goal: Ability to maintain clinical measurements within normal limits will improve Outcome: Progressing Goal: Will remain free from infection Outcome: Progressing Goal: Diagnostic test results will improve Outcome: Progressing Goal: Cardiovascular complication will be avoided Outcome: Progressing   Problem: Activity: Goal: Risk for activity intolerance will decrease Outcome: Progressing   Problem: Nutrition: Goal: Adequate nutrition will be maintained Outcome: Progressing   Problem: Elimination: Goal: Will not experience complications related to urinary retention Outcome: Progressing   Problem: Pain Managment: Goal: General experience of comfort will improve Outcome: Progressing

## 2020-01-17 NOTE — Progress Notes (Addendum)
LVAD Coordinator Rounding Note:  Admitted 01/12/20 due bleeding from his mouth in the setting of supratherapuetic INR, low hgb and low flows.   HM III LVAD implanted on 02/26/19 by Dr Donata Clay under destination therapy criteria.  Pt awake in bed this am. Over the weekend was having persistent low flows on VAD with rising LDH. No resolution with 2L IVF or increasing VAD speed. Alarms resolved with turning patient on his left side. CT with possible kink/occlusion of outflow graft, suspect this resolved with repositioning. I asked pt if he was sleeping on his stomach at all and he denies, unsure why pt would have a kink in outflow graft. Flow this morning is 3.7.  Pts speed increased Friday evening to 5400 from 5200. Pt continues to drop to low speed while just repositioning himself in bed.  INR only 1.1 today. Pt on a Heparin gtt at 800 u/hr  Vital signs: Temp: 98.2 HR: 98 Doppler Pressure: 92 Automatic BP:  98/68 (78) O2 Sat: 100% on RA Wt: 111.7>115.3>117.7>118.3 lbs  LVAD interrogation reveals:  Speed: 5400 Flow: 3.7 Power: 3.9 w PI: 4.1  Alarms: none  Events:  60+ Hematocrit: 20 - DO NOT ADJUST  Fixed speed: 5200 Low speed limit: 4900  Drive Line: Dressing C/D/I with anchor intact and accurately applied.  Next dressing change due 01/20/20 per bedside RN.   Labs:  LDH trend: 170>180>396>224  INR trend: 3.1>3.8>3.7>1.1  Anticoagulation Plan: -INR Goal: 2.0-2.5 -ASA Dose: none  Blood products: - 1u/PRBC 01/13/20  Adverse Events on VAD: - Admitted 08/02/19 due to N/V, and weakness. Creatinine elevated 2.7.   Plan/Recommendations:  1. Page VAD coordinator for any drive line or equipment issues 2. Weekly drive line dressing change per bedside RN  Carlton Adam RN VAD Coordinator  Office: [phone]  24/7 Pager: [phone]

## 2020-01-17 NOTE — Progress Notes (Signed)
CARDIAC REHAB PHASE I   PRE:  Rate/Rhythm: 94 SR asleep     127 ST sitting up     155 ST standing     172 ST urinating    BP: sitting 76 MAP    SaO2: 100 RA  Pt HR significantly increased with awaking and moving. Sat on EOB for a few minutes then stood and urinated, HR up to 172 urinating. Pt sts he is asx, surprised by alarms. Will hold ambulation now given questionable outflow tract kink. VAD numbers without significant change while HR up.  [id number]  Harriet Masson CES, ACSM 01/17/2020 2:58 PM

## 2020-01-18 ENCOUNTER — Telehealth (HOSPITAL_COMMUNITY): Payer: Self-pay | Admitting: Pharmacy Technician

## 2020-01-18 LAB — BASIC METABOLIC PANEL
Anion gap: 8 (ref 5–15)
BUN: <5 mg/dL — ABNORMAL LOW (ref 6–20)
CO2: 24 mmol/L (ref 22–32)
Calcium: 8.9 mg/dL (ref 8.9–10.3)
Chloride: 105 mmol/L (ref 98–111)
Creatinine, Ser: 0.83 mg/dL (ref 0.61–1.24)
GFR, Estimated: >60 mL/min (ref >60)
Glucose, Bld: 111 mg/dL — ABNORMAL HIGH (ref 70–99)
Potassium: 3.8 mmol/L (ref 3.5–5.1)
Sodium: 137 mmol/L (ref 135–145)

## 2020-01-18 LAB — LACTATE DEHYDROGENASE: LDH: 215 U/L — ABNORMAL HIGH (ref 98–192)

## 2020-01-18 LAB — PROTIME-INR
INR: 1.1 (ref 0.8–1.2)
Prothrombin Time: 13.3 seconds (ref 11.4–15.2)

## 2020-01-18 LAB — HEPARIN LEVEL (UNFRACTIONATED)
Heparin Unfractionated: 0.18 IU/mL — ABNORMAL LOW (ref 0.30–0.70)
Heparin Unfractionated: 0.26 IU/mL — ABNORMAL LOW (ref 0.30–0.70)

## 2020-01-18 LAB — TSH: TSH: 1.862 u[IU]/mL (ref 0.350–4.500)

## 2020-01-18 LAB — MAGNESIUM: Magnesium: 1.9 mg/dL (ref 1.7–2.4)

## 2020-01-18 MED ORDER — WARFARIN SODIUM 7.5 MG PO TABS
7.5000 mg | ORAL_TABLET | Freq: Once | ORAL | Status: AC
  Administered 2020-01-18: 7.5 mg via ORAL
  Filled 2020-01-18: qty 1

## 2020-01-18 MED ORDER — MAGNESIUM SULFATE 2 GM/50ML IV SOLN
2.0000 g | Freq: Once | INTRAVENOUS | Status: AC
  Administered 2020-01-18: 2 g via INTRAVENOUS
  Filled 2020-01-18: qty 50

## 2020-01-18 NOTE — Plan of Care (Signed)

## 2020-01-18 NOTE — Progress Notes (Addendum)
Patient ID: Jon Martin, male   DOB: [birth year], 34 y.o.   MRN: [id number]   Advanced Heart Failure VAD Team Note  PCP-Cardiologist: Dr. Gala Romney   Patient Profile   Jon Mcclaren "[name] is from [facility] [place] with history of severe systolic HF due to NICM EF 15% and prior polysubstance abuse (cocaine and ETOH). 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   Admitted for ABLA (bleeding from mouth) in setting of supra therapeutic INR and had low flow alarms.   INR 8.0 on admit. Hgb 7.3>>6.5.    Subjective:    Received FFP + 1 unit of PRBCs on admit  Was having persistent low flows on VAD with rising LDH. No resolution with 2L IVF or increasing VAD speed. Alarms resolved with turning patient on his left side. CT with possible kink/occlusion of outflow graft, suspect this resolved with repositioning.   LDH 437 -> 387 -> 326 -> 233->224->215 INR 2.4 -> 1.3-->1.1->1.1 On heparin drip + coumadin.  Platelets Improving 55K -59K -> 78->124->pending Hgb 8.2 -> 8.5->8.5-> pending    RUQ u/s: normal  Ivabradine added yesterday for tachycardia. Got 500 mL fluid bolus yesterday. Wt stable.    He remains tachycardic today, resting HR low 100s, increases to 110s when he sits up. Asymptomatic.   He feels well. No dyspnea. Appetite is good.   LVAD INTERROGATION:  HeartMate III LVAD:   Flow 3.8 liters/min, speed 5450, power 3.8, PI 4.1 Mul;tiple PI events. No low flow alarms.    Objective:    Vital Signs:   Temp:  [98.2 F (36.8 C)-99.5 F (37.5 C)] 98.5 F (36.9 C) (12/14 0812) Pulse Rate:  [87-100] 88 (12/14 0812) Resp:  [14-19] 19 (12/14 0812) BP: (91-106)/(63-79) 106/76 (12/14 0812) SpO2:  [98 %-100 %] 100 % (12/14 0812) Weight:  [54.2 kg] 54.2 kg (12/14 0424) Last BM Date: 01/16/20 Mean arterial Pressure 80s  Intake/Output:   Intake/Output Summary (Last 24 hours) at 01/18/2020 1005 Last data filed at 01/17/2020 2300 Gross per 24 hour  Intake 87.6 ml  Output 1500 ml  Net -1412.4 ml      Physical Exam    Physical Exam: GENERAL: thin well appearing young AAMNo acute distress. HEENT: normal  NECK: Supple, JVP flat.  2+ bilaterally, no bruits.  No lymphadenopathy or thyromegaly appreciated.   CARDIAC:  Mechanical heart sounds with LVAD hum present. Tachy rate  LUNGS:  Clear to auscultation bilaterally. No wheezing  ABDOMEN:  Soft, round, nontender, positive bowel sounds x4.     LVAD exit site:  Dressing dry and intact.  No erythema or drainage.  Stabilization device present and accurately applied.  Driveline dressing is being changed daily per sterile technique. EXTREMITIES:  Warm and dry, no cyanosis, clubbing, rash or edema  NEUROLOGIC:  Alert and oriented x 3.    No aphasia.  No dysarthria.  Affect pleasant.      Telemetry   Sinus tach, low 100s-110s, 5 beats NSVT   Labs   Basic Metabolic Panel: Recent Labs  Lab 01/12/20 2002 01/13/20 0009 01/14/20 0018 01/15/20 0101 01/16/20 0120 01/17/20 0105 01/18/20 0031  NA 134* 135 133* 135 134* 135 137  K 3.2* 3.9 3.3* 3.9 3.7 3.6 3.8  CL 103 104 100 104 102 101 105  CO2 18* 22 23 23 22 25 24   GLUCOSE 149* 100* 127* 102* 125* 100* 111*  BUN 14 12 <5* <5* <5* <5* <5*  CREATININE 1.27* 0.94 0.87 0.67 0.72 0.69 0.83  CALCIUM 8.6* 9.0 8.6*  9.2 9.5 9.0 8.9  MG 1.6* 2.3  --  1.3*  --  1.9 1.9    Liver Function Tests: Recent Labs  Lab 01/12/20 2002 01/15/20 0101 01/17/20 0748  AST 38 306* 88*  ALT 24 136* 82*  ALKPHOS 60 59 75  BILITOT 1.7* 1.3* 0.8  PROT 6.4* 5.9* 6.2*  ALBUMIN 3.5 3.3* 3.3*   No results for input(s): LIPASE, AMYLASE in the last 168 hours. No results for input(s): AMMONIA in the last 168 hours.  CBC: Recent Labs  Lab 01/12/20 2002 01/13/20 0009 01/13/20 0607 01/14/20 0018 01/14/20 1926 01/15/20 0101 01/16/20 0120 01/17/20 0105  WBC 11.3* 8.3  --  4.8  --  4.3 5.7 7.1  NEUTROABS 9.5*  --   --   --   --   --   --   --   HGB 7.3* 6.5*   < > 8.4* 8.1* 8.2* 8.5* 8.5*  HCT  20.9* 19.2*   < > 24.8* 24.6* 23.5* 24.9* 25.0*  MCV 97.7 98.5  --  94.7  --  94.0 96.9 97.7  PLT 71* 63*  --  55*  --  59* 78* 124*   < > = values in this interval not displayed.    INR: Recent Labs  Lab 01/14/20 0018 01/15/20 0101 01/16/20 0120 01/17/20 0105 01/18/20 0031  INR 3.7* 2.4* 1.3* 1.1 1.1    Other results:     Imaging   No results found.   Medications:     Scheduled Medications: . ivabradine  5 mg Oral BID WC  . pantoprazole  40 mg Oral BID  . potassium chloride  40 mEq Oral Daily  . Warfarin - Pharmacist Dosing Inpatient   Does not apply q1600    Infusions: . heparin 900 Units/hr (01/18/20 0104)    PRN Medications: acetaminophen, ondansetron (ZOFRAN) IV, white petrolatum    Assessment/Plan:     1. Anemia/ Thrombocytopenia  - clearly had source of bleeding on lower lip but drop in hgb seems out of proportion. ?Hemolysis from kinking in graft.   - INR down to 1.1, on heparin gtt + warfarin.   - No melena or other evidence of active bleeding.  - Hgb has been stable and plts up-trending, repeat CBC pending  - continue PPI - peripheral smear no evidence of hemolysis - RUQ u/s normal - doubt splenic sequestration - suspect possible obstruction/clot of outflow graft causing PLTs and HGb to drop, now resolved (see below)  2. Persistent low flow alarms with rising LDH and falling PLTs - CT 12/10 concern for outflow graft kink/clot - Low flow alarms resolved with patient on left side - Ramp echo with adequate cannula position. Pump sounds fine on exam - Suspect resolution of kinking with repositioning.  No further low flow alarms. LDH back to baseline at 215  - Continue heparin gtt, back on warfarin.   3. Supratherapeutic INR - INR 8 at [facility]   Given 2 units FFP prior to transfer.  - INR now subtherapeutic. Resolved as above.   4. Chronic Systolic HF due to NICM - Echo EF 10% with biventricular failure - HM-3 VAD implant  02/26/19 - Echo 7/21 EF 40-45% - Ramp echo 12/10 EF 35-40% - Maps stable. 80s.  - No bb or ARB with previous dehydration/RV failure.  - Off sildenafil  - Volume status stable.  - VAD management as above  5. Hypokalemia/hypomag - K 3.8 - Mag 1.9 - Supp  7. Tachycardia - Resting  HR low 100s, 110s-120s w/ ambulation - BP stable - CBC pending  - Volume status ok - Chest CT on admit negative for PE  - Continue ivabradine, may titrate further to 7.5 bid if needed  - Check TSH    I reviewed the LVAD parameters from today, and compared the results to the patient's prior recorded data.  No programming changes were made.  The LVAD is functioning within specified parameters.  The patient performs LVAD self-test daily.  LVAD interrogation was negative for any significant power changes, alarms or PI events/speed drops.  LVAD equipment check completed and is in good working order.  Back-up equipment present.   LVAD education done on emergency procedures and precautions and reviewed exit site care.  Length of Stay: [address] [place] 01/18/2020, 10:05 AM  VAD Team --- VAD ISSUES ONLY--- Pager [phone] (7am - 7am)  Advanced Heart Failure Team  Pager [phone] (M-F; 7a - 4p)  Please contact [facility] for night-coverage after hours (4p -7a ) and weekends on [email or web address]   Patient seen and examined with the above-signed Advanced Practice Provider and/or Housestaff. I personally reviewed laboratory data, imaging studies and relevant notes. I independently examined the patient and formulated the important aspects of the plan. I have edited the note to reflect any of my changes or salient points. I have personally discussed the plan with the patient and/or family.  Making progress every day. Suspect transient kinking/thrombosis of LVAD outflow graft. Numbers now improving. Ivabradine started for sinus tach. Improved. On heparin for INR 1.1. No bleeding. CBC pending. LDH down to  215  General:  NAD.  HEENT: normal  Neck: supple. JVP not elevated.  Carotids 2+ bilat; no bruits. No lymphadenopathy or thryomegaly appreciated. Cor: LVAD hum.  Lungs: Clear. Abdomen: soft, nontender, non-distended. No hepatosplenomegaly. No bruits or masses. Good bowel sounds. Driveline site clean. Anchor in place.  Extremities: no cyanosis, clubbing, rash. Warm no edema  Neuro: alert & oriented x 3. No focal deficits. Moves all 4 without problem   LDH imprvoing. PLTs coming up. Tachycardia looks better. Continue heparin/coumadin. Discussed dosing with PharmD personally..Ambulate. VAD interrogated personally. Parameters stable.  Arvilla Meres, MD  10:47 AM

## 2020-01-18 NOTE — Progress Notes (Signed)
ANTICOAGULATION CONSULT NOTE - Follow Up Consult  Pharmacy Consult for Heparin>Warfarin Indication: LVAD  No Known Allergies  Patient Measurements: Height: 5\' 5"  (165.1 cm) Weight: 53.7 kg (118 lb 6.2 oz) IBW/kg (Calculated) : 61.5 Heparin Dosing Weight: 50.7 kg  Vital Signs: Temp: 99.1 F (37.3 C) (12/13 2257) Temp Source: Oral (12/13 2257) BP: 98/79 (12/13 2257) Pulse Rate: 99 (12/13 2257)  Labs: Recent Labs    01/15/20 0101 01/16/20 0120 01/16/20 1615 01/17/20 0105 01/17/20 0748 01/18/20 0031  HGB 8.2* 8.5*  --  8.5*  --   --   HCT 23.5* 24.9*  --  25.0*  --   --   PLT 59* 78*  --  124*  --   --   LABPROT 25.2* 15.9*  --  13.6  --  13.3  INR 2.4* 1.3*  --  1.1  --  1.1  HEPARINUNFRC  --   --    < > 0.15* 0.18* 0.18*  CREATININE 0.67 0.72  --  0.69  --   --    < > = values in this interval not displayed.    Estimated Creatinine Clearance: 98.8 mL/min (by C-G formula based on SCr of 0.69 mg/dL).   Assessment: 34 yom presenting after nausea, coughing up blood, and having low flows. Went to OSH - Hgb was 8, INR was elevated (received 2 units FFP).  PTA regimen is 5 mg daily except 2.5 mg MWF.   Pt on heparin bridge while INR subtherapeutic. Heparin level 0.18 (subtherapeutic) on gtt at 800 units/hr. No issues with line or bleeding reported per RN.  Goal of Therapy:  Heparin level goal 0.3-0.5>>aiming for ~0.3 INR 2-2.5 Monitor platelets by anticoagulation protocol: Yes   Plan:  Increase heparin to 900 units/hr Will f/u 6 hr heparin level  01/20/20, PharmD, BCPS Please see amion for complete clinical pharmacist phone list 01/18/2020 12:58 AM

## 2020-01-18 NOTE — Progress Notes (Signed)
LVAD Coordinator Rounding Note:  Admitted 01/12/20 due bleeding from his mouth in the setting of supratherapuetic INR, low hgb and low flows.   HM III LVAD implanted on 02/26/19 by Dr Donata Clay under destination therapy criteria.  Pt asleep in bed this am. Over the weekend was having persistent low flows on VAD with rising LDH. No resolution with 2L IVF or increasing VAD speed. Alarms resolved with turning patient on his left side. CT with possible kink/occlusion of outflow graft, suspect this resolved with repositioning.Pt denies sleeping on his stomach.  Pts HR yesterday afternoon elevated. Metoprolol restarted. HR 80s today.  Pts speed increased Friday evening to 5400 from 5200. Pt had 80+ PI yesterday and has had 40 today. Pt states that he has not walked the unit or really been out of bed. Nurse instructed to walk pt today to ensure that pt can tolerate speed of 5400. Pt instructed to let us know if he gets dizzy or lightheaded while ambulating.  INR only 1.1 today. Pt on a Heparin gtt at 900 u/hr  Vital signs: Temp: 98.5 HR: 88 Doppler Pressure: not done Automatic BP:  106/76 (76) O2 Sat: 100% on RA Wt: 111.7>115.3>117.7>118.3>119.4 lbs  LVAD interrogation reveals:  Speed: 5400 Flow: 3.9 Power: 3.9 w PI: 3.4  Alarms: none  Events:  80+yesterday; 40 already today Hematocrit: 20 - DO NOT ADJUST  Fixed speed: 5400 Low speed limit: 5100  Drive Line: Dressing C/D/I with anchor intact and accurately applied.  Next dressing change due 01/20/20 per bedside RN.   Labs:  LDH trend: 170>180>396>224>215  INR trend: 3.1>3.8>3.7>1.1  Anticoagulation Plan: -INR Goal: 2.0-2.5 -ASA Dose: none  Blood products: - 1u/PRBC 01/13/20  Adverse Events on VAD: - Admitted 08/02/19 due to N/V, and weakness. Creatinine elevated 2.7.   Plan/Recommendations:  1. Page VAD coordinator for any drive line or equipment issues 2. Weekly drive line dressing change per bedside RN  Carlton Adam  RN VAD Coordinator  Office: [phone]  24/7 Pager: [phone]

## 2020-01-18 NOTE — Progress Notes (Signed)
CARDIAC REHAB PHASE I   PRE:  Rate/Rhythm: 83 SR asleep, 100 EOB    BP: sitting 87 map with automatic    SaO2: 99 RA  MODE:  Ambulation: 800 ft   POST:  Rate/Rhythm: 117 ST walking, brief 127 st after sitting    BP: sitting 78 dopplar     SaO2:   Tolerated well, HR much improved compared to yesterday with activity. Briefly 127 ST after walk and sitting, resolved quickly. Pt without any c/o. Return to bed. [id number]   Harriet Masson CES, ACSM 01/18/2020 3:09 PM

## 2020-01-18 NOTE — Progress Notes (Signed)
ANTICOAGULATION CONSULT NOTE - Follow Up Consult  Pharmacy Consult for Heparin>Warfarin Indication: LVAD  No Known Allergies  Patient Measurements: Height: 5\' 5"  (165.1 cm) Weight: 54.2 kg (119 lb 7.8 oz) IBW/kg (Calculated) : 61.5 Heparin Dosing Weight: 50.7 kg  Vital Signs: Temp: 98.5 F (36.9 C) (12/14 0812) Temp Source: Axillary (12/14 0812) BP: 106/76 (12/14 0812) Pulse Rate: 88 (12/14 0812)  Labs: Recent Labs    01/16/20 0120 01/16/20 1615 01/17/20 0105 01/17/20 0748 01/18/20 0031 01/18/20 0715  HGB 8.5*  --  8.5*  --   --   --   HCT 24.9*  --  25.0*  --   --   --   PLT 78*  --  124*  --   --   --   LABPROT 15.9*  --  13.6  --  13.3  --   INR 1.3*  --  1.1  --  1.1  --   HEPARINUNFRC  --    < > 0.15* 0.18* 0.18* 0.26*  CREATININE 0.72  --  0.69  --  0.83  --    < > = values in this interval not displayed.    Estimated Creatinine Clearance: 96.1 mL/min (by C-G formula based on SCr of 0.83 mg/dL).   Medical History: Past Medical History:  Diagnosis Date  . Dilated cardiomyopathy (HCC)   . Dilated cardiomyopathy (HCC) 02/2019  . Polysubstance abuse (HCC)     Medications:  Scheduled:  . ivabradine  5 mg Oral BID WC  . pantoprazole  40 mg Oral BID  . potassium chloride  40 mEq Oral Daily  . warfarin  7.5 mg Oral ONCE-1600  . Warfarin - Pharmacist Dosing Inpatient   Does not apply q1600    Assessment: 34 yom presenting after nausea, coughing up blood, and having low flows. Went to OSH - Hgb was 8, INR was elevated (received 2 units FFP).   PTA regimen is 5 mg daily except 2.5 mg MWF.   INR today 1.1, slow to move after holding warfarin this admit. Hgb low stable 8.5, plt improved 55>120s, abdominal 03/2019 negative, LDH bump 170>400>200s down trending. Now on low dose heparin drip 900 uts/hr, aiming for low end of goal HL 0.26 - no changes and resumed his warfarin.  Goal of Therapy:  Heparin level goal 0.3-0.5>>aiming for ~0.3 INR 2-2.5 Monitor  platelets by anticoagulation protocol: Yes   Plan:  Continue Heparin 900 uts/hr  Warfarin 7.5mg  tonight Monitor daily INR, CBC, and heparin level    [place] Pharm.D. CPP, BCPS Clinical Pharmacist [phone] 01/18/2020 10:27 AM

## 2020-01-18 NOTE — Telephone Encounter (Signed)
Patient Advocate Encounter   Received notification from [employer] that prior authorization for [place] is required.   PA submitted on CoverMyMeds Key [id number] Status is pending   Will continue to follow.

## 2020-01-19 ENCOUNTER — Other Ambulatory Visit: Payer: Self-pay

## 2020-01-19 ENCOUNTER — Encounter (HOSPITAL_COMMUNITY): Payer: Self-pay | Admitting: Internal Medicine

## 2020-01-19 LAB — CBC
HCT: 26.2 % — ABNORMAL LOW (ref 39.0–52.0)
Hemoglobin: 8.4 g/dL — ABNORMAL LOW (ref 13.0–17.0)
MCH: 32.2 pg (ref 26.0–34.0)
MCHC: 32.1 g/dL (ref 30.0–36.0)
MCV: 100.4 fL — ABNORMAL HIGH (ref 80.0–100.0)
Platelets: 231 10*3/uL (ref 150–400)
RBC: 2.61 MIL/uL — ABNORMAL LOW (ref 4.22–5.81)
RDW: 18 % — ABNORMAL HIGH (ref 11.5–15.5)
WBC: 7.7 10*3/uL (ref 4.0–10.5)
nRBC: 0 % (ref 0.0–0.2)

## 2020-01-19 LAB — BASIC METABOLIC PANEL
Anion gap: 8 (ref 5–15)
BUN: 7 mg/dL (ref 6–20)
CO2: 25 mmol/L (ref 22–32)
Calcium: 9.3 mg/dL (ref 8.9–10.3)
Chloride: 102 mmol/L (ref 98–111)
Creatinine, Ser: 0.75 mg/dL (ref 0.61–1.24)
GFR, Estimated: >60 mL/min (ref >60)
Glucose, Bld: 107 mg/dL — ABNORMAL HIGH (ref 70–99)
Potassium: 3.9 mmol/L (ref 3.5–5.1)
Sodium: 135 mmol/L (ref 135–145)

## 2020-01-19 LAB — MAGNESIUM: Magnesium: 2.1 mg/dL (ref 1.7–2.4)

## 2020-01-19 LAB — PROTIME-INR
INR: 1.1 (ref 0.8–1.2)
Prothrombin Time: 13.6 seconds (ref 11.4–15.2)

## 2020-01-19 LAB — LACTATE DEHYDROGENASE: LDH: 209 U/L — ABNORMAL HIGH (ref 98–192)

## 2020-01-19 LAB — HEPARIN LEVEL (UNFRACTIONATED): Heparin Unfractionated: 0.29 IU/mL — ABNORMAL LOW (ref 0.30–0.70)

## 2020-01-19 MED ORDER — WARFARIN SODIUM 7.5 MG PO TABS
7.5000 mg | ORAL_TABLET | Freq: Once | ORAL | Status: AC
  Administered 2020-01-19: 7.5 mg via ORAL
  Filled 2020-01-19: qty 1

## 2020-01-19 NOTE — Progress Notes (Signed)
ANTICOAGULATION CONSULT NOTE - Follow Up Consult  Pharmacy Consult for Heparin>Warfarin Indication: LVAD  No Known Allergies  Patient Measurements: Height: 5\' 5"  (165.1 cm) Weight: 54.4 kg (119 lb 14.9 oz) IBW/kg (Calculated) : 61.5 Heparin Dosing Weight: 50.7 kg  Vital Signs: Temp: 99.3 F (37.4 C) (12/15 0733) Temp Source: Oral (12/15 0733) BP: 93/80 (12/15 0733) Pulse Rate: 116 (12/15 0733)  Labs: Recent Labs    01/17/20 0105 01/17/20 0748 01/18/20 0031 01/18/20 0715 01/19/20 0013  HGB 8.5*  --   --   --  8.4*  HCT 25.0*  --   --   --  26.2*  PLT 124*  --   --   --  231  LABPROT 13.6  --  13.3  --  13.6  INR 1.1  --  1.1  --  1.1  HEPARINUNFRC 0.15*   < > 0.18* 0.26* 0.29*  CREATININE 0.69  --  0.83  --  0.75   < > = values in this interval not displayed.    Estimated Creatinine Clearance: 100.1 mL/min (by C-G formula based on SCr of 0.75 mg/dL).   Medical History: Past Medical History:  Diagnosis Date  . Dilated cardiomyopathy (HCC)   . Dilated cardiomyopathy (HCC) 02/2019  . Polysubstance abuse (HCC)     Medications:  Scheduled:  . ivabradine  5 mg Oral BID WC  . pantoprazole  40 mg Oral BID  . potassium chloride  40 mEq Oral Daily  . warfarin  7.5 mg Oral ONCE-1600  . Warfarin - Pharmacist Dosing Inpatient   Does not apply q1600    Assessment: 34 yom presenting after nausea, coughing up blood, and having low flows. Went to OSH - Hgb was 8, INR was elevated (received 2 units FFP).   PTA regimen is 5 mg daily except 2.5 mg MWF.   INR today 1.1, slow to move after holding warfarin this admit. Hgb low stable 8.5, plt improved 55>200s, abdominal 03/2019 negative, LDH bump 170>400>200s down trending. Now on low dose heparin drip 900 uts/hr HL 0.29 at goal and creeping up.Aiming for low end of goal HL unitl INR close to 1.8 with recent +FOB and drop in h/h.  Goal of Therapy:  Heparin level goal 0.3-0.5>>aiming for ~0.3 INR 2-2.5 Monitor platelets by  anticoagulation protocol: Yes   Plan:  Decrease Heparin 800 uts/hr  Warfarin 7.5mg  tonight - repeat boost dose tonight Monitor daily INR, CBC, and heparin level    [place] Pharm.D. CPP, BCPS Clinical Pharmacist [phone] 01/19/2020 2:29 PM

## 2020-01-19 NOTE — Progress Notes (Addendum)
Patient ID: Kolbe Delmonaco, male   DOB: Apr 14, 1985, 34 y.o.   MRN: [id number]   Advanced Heart Failure VAD Team Note  PCP-Cardiologist: Dr. Gala Romney   Patient Profile   Mr Bellanca "[name] is from [facility] [place] with history of severe systolic HF due to NICM EF 15% and prior polysubstance abuse (cocaine and ETOH). 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   Admitted for ABLA (bleeding from mouth) in setting of supra therapeutic INR and had low flow alarms.   INR 8.0 on admit. Hgb 7.3>>6.5.    Subjective:    Received FFP + 1 unit of PRBCs on admit  Was having persistent low flows on VAD with rising LDH. No resolution with 2L IVF or increasing VAD speed. Alarms resolved with turning patient on his left side. CT with possible kink/occlusion of outflow graft, suspect this resolved with repositioning.   LDH 437 -> 387 -> 326 -> 233->224->215>209 INR 2.4 -> 1.3-->1.1->1.1 On heparin drip + coumadin.  Platelets Improving 55K -59K -> 78->124->231  Hgb 8.2 -> 8.5->8.5-> 8.4    RUQ u/s: normal  Feels good today. Denies SOB.    LVAD INTERROGATION:  HeartMate III LVAD:   Flow 4  liters/min, speed 5400 , power 4  PI 3.1  No low flow alarms.    Objective:    Vital Signs:   Temp:  [98.3 F (36.8 C)-99.5 F (37.5 C)] 99.3 F (37.4 C) (12/15 0733) Pulse Rate:  [72-116] 116 (12/15 0733) Resp:  [12-21] 19 (12/15 0733) BP: (93-103)/(64-81) 93/80 (12/15 0733) SpO2:  [97 %-100 %] 97 % (12/15 0733) Weight:  [54.4 kg] 54.4 kg (12/15 0510) Last BM Date: 01/18/20 Mean arterial Pressure 80s  Intake/Output:   Intake/Output Summary (Last 24 hours) at 01/19/2020 1113 Last data filed at 01/19/2020 0911 Gross per 24 hour  Intake 498.22 ml  Output 4100 ml  Net -3601.78 ml     Physical Exam    Physical Exam: GENERAL: No acute distress. HEENT: normal  NECK: Supple, JVP flat.  2+ bilaterally, no bruits.  No lymphadenopathy or thyromegaly appreciated.   CARDIAC:  Mechanical heart sounds with LVAD hum  present.  LUNGS:  Clear to auscultation bilaterally.  ABDOMEN:  Soft, round, nontender, positive bowel sounds x4.     LVAD exit site:  Dressing dry and intact.  No erythema or drainage.  Stabilization device present and accurately applied.  Driveline dressing is being changed daily per sterile technique. EXTREMITIES:  Warm and dry, no cyanosis, clubbing, rash or edema  NEUROLOGIC:  Alert and oriented x 3.    No aphasia.  No dysarthria.  Affect pleasant.    .      Telemetry   SR -ST 80-100s personally reviewed.    Labs   Basic Metabolic Panel: Recent Labs  Lab 01/13/20 0009 01/14/20 0018 01/15/20 0101 01/16/20 0120 01/17/20 0105 01/18/20 0031 01/19/20 0013  NA 135   < > 135 134* 135 137 135  K 3.9   < > 3.9 3.7 3.6 3.8 3.9  CL 104   < > 104 102 101 105 102  CO2 22   < > 23 22 25 24 25   GLUCOSE 100*   < > 102* 125* 100* 111* 107*  BUN 12   < > <5* <5* <5* <5* 7  CREATININE 0.94   < > 0.67 0.72 0.69 0.83 0.75  CALCIUM 9.0   < > 9.2 9.5 9.0 8.9 9.3  MG 2.3  --  1.3*  --  1.9  1.9 2.1   < > = values in this interval not displayed.    Liver Function Tests: Recent Labs  Lab 01/12/20 2002 01/15/20 0101 01/17/20 0748  AST 38 306* 88*  ALT 24 136* 82*  ALKPHOS 60 59 75  BILITOT 1.7* 1.3* 0.8  PROT 6.4* 5.9* 6.2*  ALBUMIN 3.5 3.3* 3.3*   No results for input(s): LIPASE, AMYLASE in the last 168 hours. No results for input(s): AMMONIA in the last 168 hours.  CBC: Recent Labs  Lab 01/12/20 2002 01/13/20 0009 01/14/20 0018 01/14/20 1926 01/15/20 0101 01/16/20 0120 01/17/20 0105 01/19/20 0013  WBC 11.3*   < > 4.8  --  4.3 5.7 7.1 7.7  NEUTROABS 9.5*  --   --   --   --   --   --   --   HGB 7.3*   < > 8.4* 8.1* 8.2* 8.5* 8.5* 8.4*  HCT 20.9*   < > 24.8* 24.6* 23.5* 24.9* 25.0* 26.2*  MCV 97.7   < > 94.7  --  94.0 96.9 97.7 100.4*  PLT 71*   < > 55*  --  59* 78* 124* 231   < > = values in this interval not displayed.    INR: Recent Labs  Lab 01/15/20 0101  01/16/20 0120 01/17/20 0105 01/18/20 0031 01/19/20 0013  INR 2.4* 1.3* 1.1 1.1 1.1    Other results:     Imaging   No results found.   Medications:     Scheduled Medications: . ivabradine  5 mg Oral BID WC  . pantoprazole  40 mg Oral BID  . potassium chloride  40 mEq Oral Daily  . Warfarin - Pharmacist Dosing Inpatient   Does not apply q1600    Infusions: . heparin 900 Units/hr (01/19/20 0019)    PRN Medications: acetaminophen, ondansetron (ZOFRAN) IV, white petrolatum    Assessment/Plan:     1. Anemia/ Thrombocytopenia  - clearly had source of bleeding on lower lip but drop in hgb seems out of proportion. ?Hemolysis from kinking in graft.   - INR down to 1.1, on heparin gtt + warfarin.   - No melena or other evidence of active bleeding.  - Hgb has been stable and plts up-trending, repeat CBC pending  - continue PPI - peripheral smear no evidence of hemolysis - RUQ u/s normal - doubt splenic sequestration - suspect possible obstruction/clot of outflow graft causing PLTs and HGb to drop, now resolved (see below)  2. Persistent low flow alarms with rising LDH and falling PLTs - CT 12/10 concern for outflow graft kink/clot - Low flow alarms resolved with patient on left side - Ramp echo with adequate cannula position. Pump sounds fine on exam - Suspect resolution of kinking with repositioning.  No further low flow alarms. LDH back to baseline at 209  - Continue heparin gtt, back on warfarin.   3. Supratherapeutic INR - INR 8 at [facility]   Given 2 units FFP prior to transfer.  - INR 1.1 today   4. Chronic Systolic HF due to NICM - Echo EF 10% with biventricular failure - HM-3 VAD implant 02/26/19 - Echo 7/21 EF 40-45% - Ramp echo 12/10 EF 35-40% - Maps stable 80s  - No bb or ARB with previous dehydration/RV failure.  - Off sildenafil  - Volume status stable.  - VAD management as above  5. Hypokalemia/hypomag - K 3.9 - Mag 2.1    7.  Tachycardia - CBC stable.   -  Volume status stable.  - Chest CT on admit negative for PE  - Continue ivabradine, may titrate further to 7.5 bid if needed  - TSH stable.    I reviewed the LVAD parameters from today, and compared the results to the patient's prior recorded data.  No programming changes were made.  The LVAD is functioning within specified parameters.  The patient performs LVAD self-test daily.  LVAD interrogation was negative for any significant power changes, alarms or PI events/speed drops.  LVAD equipment check completed and is in good working order.  Back-up equipment present.   LVAD education done on emergency procedures and precautions and reviewed exit site care.  Length of Stay: 7  [name], NP 01/19/2020, 11:13 AM  VAD Team --- VAD ISSUES ONLY--- Pager [phone] (7am - 7am)  Advanced Heart Failure Team  Pager [phone] (M-F; 7a - 4p)  Please contact [facility] for night-coverage after hours (4p -7a ) and weekends on [email or web address]   Patient seen and examined with the above-signed Advanced Practice Provider and/or Housestaff. I personally reviewed laboratory data, imaging studies and relevant notes. I independently examined the patient and formulated the important aspects of the plan. I have edited the note to reflect any of my changes or salient points. I have personally discussed the plan with the patient and/or family.  He feels much better. Walking the halls without difficulty though HR still remains elevated. VAD parameters stable. LDH back to baseline. On heparin/warfarin. INR 1.1  General:  NAD.  HEENT: normal  Neck: supple. JVP not elevated.  Carotids 2+ bilat; no bruits. No lymphadenopathy or thryomegaly appreciated. Cor: LVAD hum.  Lungs: Clear. Abdomen: obese soft, nontender, non-distended. No hepatosplenomegaly. No bruits or masses. Good bowel sounds. Driveline site clean. Anchor in place.  Extremities: no cyanosis, clubbing, rash. Warm no edema   Neuro: alert & oriented x 3. No focal deficits. Moves all 4 without problem   Remains tachy but improving on ivabradine. Volume status ok. On heparin/warfarin. INR 1.1/ No bleeding. Discussed dosing with PharmD personally. No further low flows on VAD. LDH back to baseline. Hgb stable.   Arvilla Meres, MD  4:24 PM

## 2020-01-19 NOTE — Progress Notes (Signed)
LVAD Coordinator Rounding Note:  Admitted 01/12/20 due bleeding from his mouth in the setting of supratherapuetic INR, low hgb and low flows.   HM III LVAD implanted on 02/26/19 by Dr Donata Clay under destination therapy criteria.  Pt asleep in bed this am. Over the weekend was having persistent low flows on VAD with rising LDH. No resolution with 2L IVF or increasing VAD speed. Alarms resolved with turning patient on his left side. CT with possible kink/occlusion of outflow graft, suspect this resolved with repositioning.Pt denies sleeping on his stomach.  Pts HR elevated this morning in the low 100s increases to 130s with any movement in bed, he had a run of SVT this morning as well  Pts speed increased Friday evening to 5400 from 5200. Pt had 70+ PI yesterday and has had 30 today. Pt states that he has not walked the unit or really been out of bed. Nurse instructed to walk pt today to ensure that pt can tolerate speed of 5400. Pt instructed to let us know if he gets dizzy or lightheaded while ambulating.  INR sustains at 1.1 today. Pt on a Heparin gtt at 900 u/hr  Vital signs: Temp: 99.3 HR: 116 Doppler Pressure: 86 Automatic BP:  93/80 (86) O2 Sat: 97% on RA Wt: 111.7>115.3>117.7>118.3>119.4>119.9 lbs  LVAD interrogation reveals:  Speed: 5400 Flow: 4 Power: 3.9 w PI: 3.4  Alarms: none  Events:  70+yesterday; 30 already today Hematocrit: 20 - DO NOT ADJUST  Fixed speed: 5400 Low speed limit: 5100  Drive Line: Dressing C/D/I with anchor intact and accurately applied.  Next dressing change due 01/20/20 per bedside RN.   Labs:  LDH trend: 170>180>396>224>215>209  INR trend: 3.1>3.8>3.7>1.1  Anticoagulation Plan: -INR Goal: 2.0-2.5 -ASA Dose: none  Blood products: - 1u/PRBC 01/13/20  Adverse Events on VAD: - Admitted 08/02/19 due to N/V, and weakness. Creatinine elevated 2.7.   Plan/Recommendations:  1. Page VAD coordinator for any drive line or equipment issues 2.  Weekly drive line dressing change per bedside RN  Carlton Adam RN VAD Coordinator  Office: [phone]  24/7 Pager: [phone]

## 2020-01-19 NOTE — Progress Notes (Signed)
CARDIAC REHAB PHASE I   PRE:  Rate/Rhythm: 80 SR sleeping, 110 upon awaking    BP: sitting 72 dopplar    SaO2:   MODE:  Ambulation: 740 ft   POST:  Rate/Rhythm: 122 ST    BP: sitting 90 dopplar     SaO2:   Pt awoken, donned batteries and ambulated. HR mostly 122 ST walking. No c/o. Upset regarding INR. Doesn't want to eat today.  [id number]   Harriet Masson CES, ACSM 01/19/2020 2:00 PM

## 2020-01-20 ENCOUNTER — Encounter (HOSPITAL_COMMUNITY): Payer: 59

## 2020-01-20 DIAGNOSIS — R7402 Elevation of levels of lactic acid dehydrogenase (LDH): Secondary | ICD-10-CM

## 2020-01-20 LAB — CBC
HCT: 25 % — ABNORMAL LOW (ref 39.0–52.0)
Hemoglobin: 8 g/dL — ABNORMAL LOW (ref 13.0–17.0)
MCH: 31.9 pg (ref 26.0–34.0)
MCHC: 32 g/dL (ref 30.0–36.0)
MCV: 99.6 fL (ref 80.0–100.0)
Platelets: 287 10*3/uL (ref 150–400)
RBC: 2.51 MIL/uL — ABNORMAL LOW (ref 4.22–5.81)
RDW: 17.9 % — ABNORMAL HIGH (ref 11.5–15.5)
WBC: 8.5 10*3/uL (ref 4.0–10.5)
nRBC: 0 % (ref 0.0–0.2)

## 2020-01-20 LAB — PROTIME-INR
INR: 1.3 — ABNORMAL HIGH (ref 0.8–1.2)
Prothrombin Time: 15.6 seconds — ABNORMAL HIGH (ref 11.4–15.2)

## 2020-01-20 LAB — BASIC METABOLIC PANEL
Anion gap: 11 (ref 5–15)
BUN: <5 mg/dL — ABNORMAL LOW (ref 6–20)
CO2: 22 mmol/L (ref 22–32)
Calcium: 9.1 mg/dL (ref 8.9–10.3)
Chloride: 103 mmol/L (ref 98–111)
Creatinine, Ser: 0.8 mg/dL (ref 0.61–1.24)
GFR, Estimated: >60 mL/min (ref >60)
Glucose, Bld: 110 mg/dL — ABNORMAL HIGH (ref 70–99)
Potassium: 3.5 mmol/L (ref 3.5–5.1)
Sodium: 136 mmol/L (ref 135–145)

## 2020-01-20 LAB — LACTATE DEHYDROGENASE: LDH: 189 U/L (ref 98–192)

## 2020-01-20 LAB — HEPARIN LEVEL (UNFRACTIONATED): Heparin Unfractionated: 0.2 IU/mL — ABNORMAL LOW (ref 0.30–0.70)

## 2020-01-20 MED ORDER — POTASSIUM CHLORIDE CRYS ER 20 MEQ PO TBCR
20.0000 meq | EXTENDED_RELEASE_TABLET | Freq: Once | ORAL | Status: AC
  Administered 2020-01-20: 20 meq via ORAL
  Filled 2020-01-20: qty 1

## 2020-01-20 MED ORDER — WARFARIN SODIUM 7.5 MG PO TABS
7.5000 mg | ORAL_TABLET | Freq: Once | ORAL | Status: AC
  Administered 2020-01-20: 7.5 mg via ORAL
  Filled 2020-01-20: qty 1

## 2020-01-20 MED ORDER — SILDENAFIL CITRATE 20 MG PO TABS
20.0000 mg | ORAL_TABLET | Freq: Three times a day (TID) | ORAL | Status: DC
Start: 2020-01-20 — End: 2020-01-21
  Administered 2020-01-20 – 2020-01-21 (×3): 20 mg via ORAL
  Filled 2020-01-20 (×3): qty 1

## 2020-01-20 NOTE — Progress Notes (Addendum)
Patient ID: Jon Martin, male   DOB: [birth year], 34 y.o.   MRN: [id number]   Advanced Heart Failure VAD Team Note  PCP-Cardiologist: Dr. Gala Romney   Patient Profile   Mr Moten "[name] is from [facility] [place] with history of severe systolic HF due to NICM EF 15% and prior polysubstance abuse (cocaine and ETOH). 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   Admitted for ABLA (bleeding from mouth) in setting of supra therapeutic INR and had low flow alarms.   INR 8.0 on admit. Hgb 7.3>>6.5.    Subjective:    Received FFP + 1 unit of PRBCs on admit  Was having persistent low flows on VAD with rising LDH. No resolution with 2L IVF or increasing VAD speed. Alarms resolved with turning patient on his left side. CT with possible kink/occlusion of outflow graft, suspect this resolved with repositioning.   LDH 437 -> 387 -> 326 -> 233->224->215>209>189  INR 2.4 -> 1.3-->1.1->1.1->1.3  On heparin drip + coumadin.  Platelets Improving 55K -59K -> 78->124->231  Hgb 8.2 -> 8.5->8.5-> 8.4 ->8   RUQ u/s: normal  No complaints. Wants to go home.    LVAD INTERROGATION:  HeartMate III LVAD:   Flow 3.8   liters/min, speed 5400 , power 4  PI 3.8  No low flow alarms.    Objective:    Vital Signs:   Temp:  [98.7 F (37.1 C)-99.4 F (37.4 C)] 99.2 F (37.3 C) (12/16 0328) Pulse Rate:  [79-92] 92 (12/16 0328) Resp:  [15-20] 17 (12/16 0328) BP: (90-110)/(76-81) 104/79 (12/16 0328) SpO2:  [100 %] 100 % (12/16 0328) Weight:  [53.9 kg] 53.9 kg (12/16 0328) Last BM Date: 01/18/20 Mean arterial Pressure 80-90s   Intake/Output:   Intake/Output Summary (Last 24 hours) at 01/20/2020 0814 Last data filed at 01/20/2020 0600 Gross per 24 hour  Intake 1046.83 ml  Output 4100 ml  Net -3053.17 ml     Physical Exam     Physical Exam: GENERAL: No acute distress. HEENT: normal  NECK: Supple, JVP flat.  2+ bilaterally, no bruits.  No lymphadenopathy or thyromegaly appreciated.   CARDIAC:  Mechanical heart  sounds with LVAD hum present.  LUNGS:  Clear to auscultation bilaterally.  ABDOMEN:  Soft, round, nontender, positive bowel sounds x4.     LVAD exit site:   Dressing dry and intact.  No erythema or drainage.  Stabilization device present and accurately applied.  Driveline dressing is being changed daily per sterile technique. EXTREMITIES:  Warm and dry, no cyanosis, clubbing, rash or edema  NEUROLOGIC:  Alert and oriented x 3.    No aphasia.  No dysarthria.  Affect pleasant.      Telemetry   SR  80-90s with occasional PVCs.    Labs   Basic Metabolic Panel: Recent Labs  Lab 01/15/20 0101 01/16/20 0120 01/17/20 0105 01/18/20 0031 01/19/20 0013 01/20/20 0028  NA 135 134* 135 137 135 136  K 3.9 3.7 3.6 3.8 3.9 3.5  CL 104 102 101 105 102 103  CO2 23 22 25 24 25 22   GLUCOSE 102* 125* 100* 111* 107* 110*  BUN <5* <5* <5* <5* 7 <5*  CREATININE 0.67 0.72 0.69 0.83 0.75 0.80  CALCIUM 9.2 9.5 9.0 8.9 9.3 9.1  MG 1.3*  --  1.9 1.9 2.1  --     Liver Function Tests: Recent Labs  Lab 01/15/20 0101 01/17/20 0748  AST 306* 88*  ALT 136* 82*  ALKPHOS 59 75  BILITOT 1.3* 0.8  PROT 5.9* 6.2*  ALBUMIN 3.3* 3.3*   No results for input(s): LIPASE, AMYLASE in the last 168 hours. No results for input(s): AMMONIA in the last 168 hours.  CBC: Recent Labs  Lab 01/15/20 0101 01/16/20 0120 01/17/20 0105 01/19/20 0013 01/20/20 0028  WBC 4.3 5.7 7.1 7.7 8.5  HGB 8.2* 8.5* 8.5* 8.4* 8.0*  HCT 23.5* 24.9* 25.0* 26.2* 25.0*  MCV 94.0 96.9 97.7 100.4* 99.6  PLT 59* 78* 124* 231 287    INR: Recent Labs  Lab 01/16/20 0120 01/17/20 0105 01/18/20 0031 01/19/20 0013 01/20/20 0028  INR 1.3* 1.1 1.1 1.1 1.3*    Other results:     Imaging   No results found.   Medications:     Scheduled Medications: . ivabradine  5 mg Oral BID WC  . pantoprazole  40 mg Oral BID  . potassium chloride  40 mEq Oral Daily  . Warfarin - Pharmacist Dosing Inpatient   Does not apply q1600     Infusions: . heparin 800 Units/hr (01/20/20 0600)    PRN Medications: acetaminophen, ondansetron (ZOFRAN) IV, white petrolatum    Assessment/Plan:     1. Anemia/ Thrombocytopenia  - clearly had source of bleeding on lower lip but drop in hgb seems out of proportion. ?Hemolysis from kinking in graft.   - Bleeding resolved. Back on heparin drip + warfarin.    - No melena or other evidence of active bleeding.  - Hgb has been stable and plts up-trending.  - continue PPI - peripheral smear no evidence of hemolysis - RUQ u/s normal - doubt splenic sequestration - suspect possible obstruction/clot of outflow graft causing PLTs and HGb to drop, now resolved (see below)  2. Persistent low flow alarms with rising LDH and falling PLTs - CT 12/10 concern for outflow graft kink/clot - Low flow alarms resolved with patient on left side - Ramp echo with adequate cannula position. Pump sounds fine on exam - Suspect resolution of kinking with repositioning.  No further low flow alarms. LDH below 200. Stable.  - Continue heparin gtt, back on warfarin.   3. Supratherapeutic INR - INR 8 at [facility]   Given 2 units FFP prior to transfer.  - INR 1.3 today   4. Chronic Systolic HF due to NICM - Echo EF 10% with biventricular failure - HM-3 VAD implant 02/26/19 - Echo 7/21 EF 40-45% - Ramp echo 12/10 EF 35-40% - Maps stable.  - No bb or ARB with previous dehydration/RV failure.  - Off sildenafil  - Volume status stable.  - VAD parameters stable.   5. Hypokalemia/hypomag - K 3.5 - Recent Mag stable.     7. Tachycardia - CBC stable.  Improved with addition of ivabradine.  - Volume status stable.  - Chest CT on admit negative for PE  - TSH stable.    I reviewed the LVAD parameters from today, and compared the results to the patient's prior recorded data.  No programming changes were made.  The LVAD is functioning within specified parameters.  The patient performs LVAD  self-test daily.  LVAD interrogation was negative for any significant power changes, alarms or PI events/speed drops.  LVAD equipment check completed and is in good working order.  Back-up equipment present.   LVAD education done on emergency procedures and precautions and reviewed exit site care.  Length of Stay: 8  [name], NP 01/20/2020, 8:14 AM  VAD Team --- VAD ISSUES ONLY--- Pager [phone] (7am - 7am)  Advanced  Heart Failure Team  Pager [phone] (M-F; 7a - 4p)  Please contact [facility] for night-coverage after hours (4p -7a ) and weekends on [email or web address]   Patient seen and examined with the above-signed Advanced Practice Provider and/or Housestaff. I personally reviewed laboratory data, imaging studies and relevant notes. I independently examined the patient and formulated the important aspects of the plan. I have edited the note to reflect any of my changes or salient points. I have personally discussed the plan with the patient and/or family.  Feels good. Walking halls. On heparin. No bleeding. Denies CP, SOB or orthopnea. LDH down. hgb stable. Frustrated about low INR. Wants to go home.  General:  NAD.  HEENT: normal  Neck: supple. JVP not elevated.  Carotids 2+ bilat; no bruits. No lymphadenopathy or thryomegaly appreciated. Cor: LVAD hum.  Lungs: Clear. Abdomen: soft, nontender, non-distended. No hepatosplenomegaly. No bruits or masses. Good bowel sounds. Driveline site clean. Anchor in place.  Extremities: no cyanosis, clubbing, rash. Warm no edema  Neuro: alert & oriented x 3. No focal deficits. Moves all 4 without problem   Low flows resolved. LDH down. PLTs up. Remains on heparin warfarin. VAD interrogated personally. Parameters stable. Home when INR 1.6 or greater.   Arvilla Meres, MD  11:44 AM

## 2020-01-20 NOTE — Discharge Summary (Signed)
Advanced Heart Failure Team  Discharge Summary   Patient ID: Jon Martin MRN: [id number], DOB/AGE: [birth year] 34 y.o. Admit date: 01/12/2020 D/C date:     01/21/2020   Primary Discharge Diagnoses:  Anemia/ Thrombocytopenia Requiring Blood Transfusion  Supratherapeutic INR HeartMate III LVAD Chronic Coumadin Therapy  Chronic Systolic Heart Failure due to [facility]    Hospital Course:   Jon Martin "[name] is from [facility] [place] with history of severe systolic HF due to NICM EF 15% and prior polysubstance abuse (cocaine and ETOH). 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   Underwent HM-3 LVAD placement on 02/26/19 by Dr. Morton Peters. Extubated 1/25. Required milrinone post op for RV support. Milrinone eventually weaned and Co-ox remained stable of inotropes at 61%. Later placed on sildenafil for continued RV support. Post op recovery complicated by hypoxic respiratory failure, moderate sized rt pneumothorax requiring CT placement.  12/29/19 INR 2.3 -VAD Pharmacy Note-  Instructed him to continue current regimen of warfarin 2.5 mg MWF and warfarin 5 mg all other days. Repeat INR in 2 weeks.   Admitted 12/8/21for anemia in setting of supratherapeutic INR. Says that his lip got scabbed up recently from the cold weather. Woke up morning of admission and his lip was bleeding and was running down his throat then started coughing up blood and having low flow alarms on VAD. Denied ab pain, melena or other GI issues. He initially went to [facility] ED. Hgb was down and INR was 8.0. Given 2UFFP and 2L IVF. Transferred to [facility] for admission.   On arrival to [facility], he had no further bleeding or VAD alarms. MAP was stable at 90. INR 3.0. HGb 7.3. Coumadin was held. He was started on PPI therapy and received another unit of PRBcs. Hgb improved and remained stable but he did have a drop in platelets. Nardir 55K. He also had several additional low flow alarms on [facility]AD [facility]. Had increase in LDH level above  baseline, up to 437. RUQ U/S was normal (doubt splenic sequestration). Peripheral smear showed no evidence of hemolysis. It was suspected possible obstruction/clot of outflow graft causing Plts and Hgb to drop. CT 12/10 showed concern for outflow graft kink/clot. Low flow alarms resolved with patient on left side. Suspect resolution of kinking with repositioning.  No further low flow alarms. LDH trended back down to his baseline, below 200s. Plts started to trend back up. H/H remained stable and no further bleeding. Coumadin was resumed w/ heparin bridge. He remained stable from a volume standpoint.   Pt was last seen and examined by Dr. Gala Romney on 12/17/21and felt stable for d/c home. INR was 1.4 day of d/c. He was discharged home on Lovenox bridge w/ plans to repeat INR on 12/20. Plan 2 days of 1/2 dose Lovenox. VAD clinic f/u arranged.    Discharge Weight Range: 118 lb  Discharge Vitals: Blood pressure 97/87, pulse 88, temperature 98.9 F (37.2 C), temperature source Oral, resp. rate 18, height 5\' 5"  (1.651 m), weight 53.6 kg, SpO2 100 %.  Labs: Lab Results  Component Value Date   WBC 7.3 01/21/2020   HGB 8.1 (L) 01/21/2020   HCT 24.1 (L) 01/21/2020   MCV 98.4 01/21/2020   PLT 378 01/21/2020    Recent Labs  Lab 01/17/20 0748 01/18/20 0031 01/21/20 0057  NA  --    < > 137  K  --    < > 3.9  CL  --    < > 104  CO2  --    < >  24  BUN  --    < > <5*  CREATININE  --    < > 0.77  CALCIUM  --    < > 9.1  PROT 6.2*  --   --   BILITOT 0.8  --   --   ALKPHOS 75  --   --   ALT 82*  --   --   AST 88*  --   --   GLUCOSE  --    < > 103*   < > = values in this interval not displayed.   Lab Results  Component Value Date   CHOL 89 02/11/2019   HDL 25 (L) 02/11/2019   LDLCALC 52 02/11/2019   TRIG 58 02/11/2019   BNP (last 3 results) Recent Labs    02/27/19 0433 03/05/19 0056 03/12/19 0542  BNP 447.0* 427.2* 135.5*    ProBNP (last 3 results) No results for input(s): PROBNP in  the last 8760 hours.   Diagnostic Studies/Procedures   No results found.  Discharge Medications   Allergies as of 01/21/2020   No Known Allergies     Medication List    STOP taking these medications   metoprolol tartrate 25 MG tablet Commonly known as: LOPRESSOR     TAKE these medications   enoxaparin 30 MG/0.3ML injection Commonly known as: Lovenox Inject 0.3 mLs (30 mg total) into the skin 2 (two) times daily.   ivabradine 5 MG Tabs tablet Commonly known as: CORLANOR Take 1 tablet (5 mg total) by mouth 2 (two) times daily with a meal.   pantoprazole 40 MG tablet Commonly known as: PROTONIX Take 1 tablet (40 mg total) by mouth daily.   potassium chloride SA 20 MEQ tablet Commonly known as: Klor-Con M20 Take 2 tablets (40 mEq total) by mouth daily. Pt needs to take 80 twice today only for a potassium of 2.6 What changed: additional instructions   sildenafil 20 MG tablet Commonly known as: REVATIO Take 1 tablet (20 mg total) by mouth 3 (three) times daily.   warfarin 5 MG tablet Commonly known as: Coumadin Take as directed. If you are unsure how to take this medication, talk to your nurse or doctor. Original instructions: Take 7.5 mg (1 1/2 tablet) 12/17 and 12/18. Then take 5 mg (1 tablet) on 12/19. Then resume regular dosing 2.5 mg (1/2 tablet) MonWedFri and 5 mg (1 tablet) all other days What changed: additional instructions       Disposition   The patient will be discharged in stable condition to home.   Follow-up Information    [facility] Follow up on 01/27/2020.   Specialty: Cardiology Why: VAD Clinic 11:00 AM Parking Garage Code [phone] Contact information: [address] [id number] Wilhemina Bonito [place] [address] [phone]       [facility] Follow up on 01/21/2020.   Why: Need to go to LAB at [facility] on Monday 12/20 for INR check                 Duration of Discharge Encounter:  Greater than 35 minutes   Signed, Robbie Lis, PA-C   01/21/2020, 12:00 PM

## 2020-01-20 NOTE — Progress Notes (Signed)
CSW met at bedside with patient who shared he is feeling down about lengthy hospitalization. Patient shared about his family and shared multiple events and pictures. He laughed as he told stories and shared about his [place] who recently passed away. He states that he has been doing most of the cooking at home since his mothers passing. Patient hopeful to be discharged home soon and denies any needs at this time. CSW continues to follow for support and any other needs as identified. [name], [place], [place]

## 2020-01-20 NOTE — Plan of Care (Signed)
  Problem: Education: Goal: Knowledge of General Education information will improve Description: Including pain rating scale, medication(s)/side effects and non-pharmacologic comfort measures Outcome: Progressing   Problem: Health Behavior/Discharge Planning: Goal: Ability to manage health-related needs will improve Outcome: Progressing   Problem: Clinical Measurements: Goal: Ability to maintain clinical measurements within normal limits will improve Outcome: Progressing Goal: Cardiovascular complication will be avoided Outcome: Progressing   Problem: Education: Goal: Patient will understand all VAD equipment and how it functions Outcome: Progressing Goal: Patient will be able to verbalize current INR target range and antiplatelet therapy for discharge home Outcome: Progressing   Problem: Cardiac: Goal: LVAD will function as expected and patient will experience no clinical alarms Outcome: Progressing

## 2020-01-20 NOTE — Progress Notes (Signed)
Pt has had hard emotional day. Asleep now (sleeps during day and awake at night). Discussed with RN, he can walk later when awake if staff is able to set him up. Ethelda Chick CES, ACSM 2:02 PM 01/20/2020

## 2020-01-20 NOTE — Progress Notes (Signed)
ANTICOAGULATION CONSULT NOTE - Follow Up Consult  Pharmacy Consult for Heparin>Warfarin Indication: LVAD  No Known Allergies  Patient Measurements: Height: 5\' 5"  (165.1 cm) Weight: 53.9 kg (118 lb 13.3 oz) IBW/kg (Calculated) : 61.5 Heparin Dosing Weight: 50.7 kg  Vital Signs: Temp: 99.3 F (37.4 C) (12/16 0822) Temp Source: Axillary (12/16 0822) BP: 104/84 (12/16 0822) Pulse Rate: 100 (12/16 0822)  Labs: Recent Labs    01/18/20 0031 01/18/20 0715 01/19/20 0013 01/20/20 0028  HGB  --   --  8.4* 8.0*  HCT  --   --  26.2* 25.0*  PLT  --   --  231 287  LABPROT 13.3  --  13.6 15.6*  INR 1.1  --  1.1 1.3*  HEPARINUNFRC 0.18* 0.26* 0.29* 0.20*  CREATININE 0.83  --  0.75 0.80    Estimated Creatinine Clearance: 99.2 mL/min (by C-G formula based on SCr of 0.8 mg/dL).   Medical History: Past Medical History:  Diagnosis Date  . Dilated cardiomyopathy (HCC)   . Dilated cardiomyopathy (HCC) 02/2019  . Polysubstance abuse (HCC)     Medications:  Scheduled:  . ivabradine  5 mg Oral BID WC  . pantoprazole  40 mg Oral BID  . potassium chloride  20 mEq Oral Once  . potassium chloride  40 mEq Oral Daily  . warfarin  7.5 mg Oral ONCE-1600  . Warfarin - Pharmacist Dosing Inpatient   Does not apply q1600    Assessment: 34 yom presenting after nausea, coughing up blood, and having low flows. Went to OSH - Hgb was 8, INR was elevated (received 2 units FFP).   PTA regimen is 5 mg daily except 2.5 mg MWF.   INR today 1.3 slow to move after holding warfarin this admit. Hgb low stable 8.5, plt improved 55>200s, abdominal 03/2019 negative, LDH bump 170>400>200s down trending. Now on low dose heparin drip 900 uts/hr HL 0.29 at goal and creeping up.Aiming for low end of goal HL unitl INR close to 1.8 with recent +FOB and drop in h/h.  Goal of Therapy:  Heparin level goal 0.3-0.5>>aiming for ~0.3 INR 2-2.5 Monitor platelets by anticoagulation protocol: Yes   Plan:  Decrease Heparin  800 uts/hr  Warfarin 7.5mg  tonight - repeat boost dose tonight Monitor daily INR, CBC, and heparin level    [place] Pharm.D. CPP, BCPS Clinical Pharmacist [phone] 01/20/2020 12:01 PM

## 2020-01-20 NOTE — Progress Notes (Signed)
LVAD Coordinator Rounding Note:  Admitted 01/12/20 due bleeding from his mouth in the setting of supratherapuetic INR, low hgb and low flows.   HM III LVAD implanted on 02/26/19 by Dr Donata Clay under destination therapy criteria.  Last weekend was having persistent low flows on VAD with rising LDH. No resolution with 2L IVF or increasing VAD speed. Alarms resolved with turning patient on his left side. CT with possible kink/occlusion of outflow graft, suspect this resolved with repositioning.Pt denies sleeping on his stomach.  Pts HR 80s this morning. Elevates into the 90s with position change in bed.   Pts speed increased Friday evening to 5400 from 5200. Pt had 100+ PI yesterday and has had 24 today. Pt states that he walked the unit yesterday and felt great. Denies dizziness/lightheadedness with ambulation. Instructed to let us know if that changes. He verbalized understanding.   He is very frustrated this morning, stating he doesn't understand why he is still in the hospital "since you fixed everything." Had long discussion regarding admission, and need for higher INR prior to discharge. He verbalized understanding. Offered pt emotional support as he remains upset with the situation.   INR 1.3 today. Pt on a Heparin gtt at 800 u/hr  Vital signs: Temp: 99.3 HR: 84 Doppler Pressure: 90 Automatic BP: 104/84 (92) O2 Sat: 99% on RA Wt: 111>115.3>117.7>118.3>119.4>119.9>118.8 lbs  LVAD interrogation reveals:  Speed: 5400 Flow: 3.9 Power: 3.9 w PI: 3.7  Alarms: none  Events:  10+yesterday; 24 already today Hematocrit: 20 - DO NOT ADJUST  Fixed speed: 5400 Low speed limit: 5100  Drive Line: Dressing C/D/I with anchor intact and accurately applied.  Next dressing change due 01/20/20 per bedside RN.   Labs:  LDH trend: 170>180>396>224>215>209>189  INR trend: 3.1>3.8>3.7>1.1>1.3  Anticoagulation Plan: -INR Goal: 2.0-2.5 -ASA Dose: none  Blood products: - 1u/PRBC  01/13/20  Adverse Events on VAD: - Admitted 08/02/19 due to N/V, and weakness. Creatinine elevated 2.7.   Plan/Recommendations:  1. Page VAD coordinator for any drive line or equipment issues 2. Weekly drive line dressing change per bedside RN  Alyce Pagan RN VAD Coordinator  Office: [phone]  24/7 Pager: [phone]

## 2020-01-20 NOTE — Telephone Encounter (Signed)
Advanced Heart Failure Patient Advocate Encounter  Prior Authorization for [place] has been approved.    Effective dates: 01/19/20 through 07/19/20  Patients co-pay is $0  Archer Asa, CPhT

## 2020-01-20 NOTE — Progress Notes (Signed)
ANTICOAGULATION CONSULT NOTE - Follow Up Consult  Pharmacy Consult for Heparin>Warfarin Indication: LVAD  No Known Allergies  Patient Measurements: Height: 5\' 5"  (165.1 cm) Weight: 53.9 kg (118 lb 13.3 oz) IBW/kg (Calculated) : 61.5 Heparin Dosing Weight: 50.7 kg  Vital Signs: Temp: 99.3 F (37.4 C) (12/16 0822) Temp Source: Axillary (12/16 0822) BP: 104/84 (12/16 0822) Pulse Rate: 100 (12/16 0822)  Labs: Recent Labs    01/18/20 0031 01/18/20 0715 01/19/20 0013 01/20/20 0028  HGB  --   --  8.4* 8.0*  HCT  --   --  26.2* 25.0*  PLT  --   --  231 287  LABPROT 13.3  --  13.6 15.6*  INR 1.1  --  1.1 1.3*  HEPARINUNFRC 0.18* 0.26* 0.29* 0.20*  CREATININE 0.83  --  0.75 0.80    Estimated Creatinine Clearance: 99.2 mL/min (by C-G formula based on SCr of 0.8 mg/dL).   Medical History: Past Medical History:  Diagnosis Date  . Dilated cardiomyopathy (HCC)   . Dilated cardiomyopathy (HCC) 02/2019  . Polysubstance abuse (HCC)     Medications:  Scheduled:  . ivabradine  5 mg Oral BID WC  . pantoprazole  40 mg Oral BID  . potassium chloride  20 mEq Oral Once  . potassium chloride  40 mEq Oral Daily  . warfarin  7.5 mg Oral ONCE-1600  . Warfarin - Pharmacist Dosing Inpatient   Does not apply q1600    Assessment: 34 yom presenting after nausea, coughing up blood, and having low flows. Went to OSH - Hgb was 6, INR was elevated at 8 (received 2 units FFP).   PTA regimen is 5 mg daily except 2.5 mg MWF.   INR today 1.3, slowly starting to bump after holding warfarin this admit. Hgb low stable 8.5, plt improved 55>200s, abdominal 03/2019 negative, LDH bump 170>400>200s down trending. Now on low dose heparin drip 8uts/hr HL 0.2 at goal. Aiming for low end of goal HL unitl INR close to 1.8 with recent +FOB and drop in h/h.  Goal of Therapy:  Heparin level goal 0.3-0.5>>aiming for <0.3 INR 2-2.5 Monitor platelets by anticoagulation protocol: Yes   Plan:  Continue Heparin 800  uts/hr  Warfarin 7.5mg  tonight - repeat boost dose tonight Monitor daily INR, CBC, and heparin level    [place] Pharm.D. CPP, BCPS Clinical Pharmacist [phone] 01/20/2020 12:04 PM

## 2020-01-21 ENCOUNTER — Other Ambulatory Visit (HOSPITAL_COMMUNITY): Payer: Self-pay | Admitting: Cardiology

## 2020-01-21 LAB — PROTIME-INR
INR: 1.4 — ABNORMAL HIGH (ref 0.8–1.2)
INR: 1.4 — ABNORMAL HIGH (ref 0.8–1.2)
Prothrombin Time: 16.2 seconds — ABNORMAL HIGH (ref 11.4–15.2)
Prothrombin Time: 17 seconds — ABNORMAL HIGH (ref 11.4–15.2)

## 2020-01-21 LAB — BASIC METABOLIC PANEL
Anion gap: 9 (ref 5–15)
BUN: <5 mg/dL — ABNORMAL LOW (ref 6–20)
CO2: 24 mmol/L (ref 22–32)
Calcium: 9.1 mg/dL (ref 8.9–10.3)
Chloride: 104 mmol/L (ref 98–111)
Creatinine, Ser: 0.77 mg/dL (ref 0.61–1.24)
GFR, Estimated: >60 mL/min (ref >60)
Glucose, Bld: 103 mg/dL — ABNORMAL HIGH (ref 70–99)
Potassium: 3.9 mmol/L (ref 3.5–5.1)
Sodium: 137 mmol/L (ref 135–145)

## 2020-01-21 LAB — CBC
HCT: 24.1 % — ABNORMAL LOW (ref 39.0–52.0)
Hemoglobin: 8.1 g/dL — ABNORMAL LOW (ref 13.0–17.0)
MCH: 33.1 pg (ref 26.0–34.0)
MCHC: 33.6 g/dL (ref 30.0–36.0)
MCV: 98.4 fL (ref 80.0–100.0)
Platelets: 378 10*3/uL (ref 150–400)
RBC: 2.45 MIL/uL — ABNORMAL LOW (ref 4.22–5.81)
RDW: 17.9 % — ABNORMAL HIGH (ref 11.5–15.5)
WBC: 7.3 10*3/uL (ref 4.0–10.5)
nRBC: 0 % (ref 0.0–0.2)

## 2020-01-21 LAB — HEPARIN LEVEL (UNFRACTIONATED): Heparin Unfractionated: <0.1 IU/mL — ABNORMAL LOW (ref 0.30–0.70)

## 2020-01-21 LAB — LACTATE DEHYDROGENASE: LDH: 178 U/L (ref 98–192)

## 2020-01-21 MED ORDER — WARFARIN SODIUM 5 MG PO TABS: ORAL_TABLET | ORAL | 11 refills | Status: DC

## 2020-01-21 MED ORDER — IVABRADINE HCL 5 MG PO TABS: 5.0000 mg | ORAL_TABLET | Freq: Two times a day (BID) | ORAL | 5 refills | Status: DC

## 2020-01-21 MED ORDER — ENOXAPARIN SODIUM 30 MG/0.3ML ~~LOC~~ SOLN
30.0000 mg | Freq: Two times a day (BID) | SUBCUTANEOUS | Status: DC
  Administered 2020-01-21: 30 mg via SUBCUTANEOUS
  Filled 2020-01-21: qty 0.3

## 2020-01-21 MED FILL — WARFARIN SODIUM 5 MG TABLET: 5 | 30 days supply | Qty: 30 | Fill #0

## 2020-01-21 MED FILL — CORLANOR 5 MG TABLET: 5 | 30 days supply | Qty: 60 | Fill #0

## 2020-01-21 NOTE — TOC Transition Note (Signed)
Transition of Care [facility]) - CM/SW Discharge Note   Patient Details  Name: Jon Martin MRN: [id number] Date of Birth: 1985-07-16  Transition of Care [facility]) CM/SW Contact:  Leone Haven, RN Phone Number: 01/21/2020, 11:32 AM   Clinical Narrative:    Patient for dc today, he has no needs.    Final next level of care: Home/Self Care Barriers to Discharge: No Barriers Identified   Patient Goals and CMS Choice Patient states their goals for this hospitalization and ongoing recovery are:: get better      Discharge Placement                       Discharge Plan and Services                  DME Agency: NA       HH Arranged: NA          Social Determinants of Health (SDOH) Interventions     Readmission Risk Interventions No flowsheet data found.

## 2020-01-21 NOTE — Care Management Important Message (Signed)
Important Message  Patient Details  Name: Jon Martin MRN: [id number] Date of Birth: 1985-04-21   Medicare Important Message Given:  Yes  Patient left prior to IM delivery.  IM will be mailed to the patient  home.  Jaime Grizzell 01/21/2020, 3:17 PM

## 2020-01-21 NOTE — Progress Notes (Signed)
LVAD Coordinator Rounding Note:  Admitted 01/12/20 due bleeding from his mouth in the setting of supratherapuetic INR, low hgb and low flows.   HM III LVAD implanted on 02/26/19 by Dr Donata Clay under destination therapy criteria.  Last weekend was having persistent low flows on VAD with rising LDH. No resolution with 2L IVF or increasing VAD speed. Alarms resolved with turning patient on his left side. CT with possible kink/occlusion of outflow graft, suspect this resolved with repositioning.Pt denies sleeping on his stomach.  Pts HR 80s this morning. Elevates into the 90s with position change in bed.   Pts speed increased Friday evening to 5400 from 5200. Pt has 100+ just for today. Pt states that he walked the unit yesterday and felt great. Denies dizziness/lightheadedness with ambulation. Instructed to let us know if that changes. He verbalized understanding.   INR 1.4 today. Pt on a Heparin gtt at 800 u/hr  Vital signs: Temp: 99.6 HR: 88 Doppler Pressure: not done Automatic BP: 91/73 (81) O2 Sat: 100% on RA Wt: 111>115.3>117.7>118.3>119.4>119.9>118.8>118.1 lbs  LVAD interrogation reveals:  Speed: 5400 Flow: 4.0 Power: 3.9 w PI: 3.5  Alarms: none  Events:  100+ just for today Hematocrit: 20 - DO NOT ADJUST  Fixed speed: 5400 Low speed limit: 5100  Drive Line: Existing VAD dressing removed and site care performed using sterile technique. Drive line exit site cleaned with Chlora prep applicators x 2, allowed to dry, and Sorbaview dressing with bio patch re-applied. Did not use skin prep. Exit site healed and incorporated, the velour is fully implanted at exit site. No redness, tenderness, drainage, foul odor or rash noted. Drive line anchor re-applied. Next dressing change due 01/28/20 per bedside RN.   Labs:  LDH trend: 170>180>396>224>215>209>189>178  INR trend: 3.1>3.8>3.7>1.1>1.3>1.4  Anticoagulation Plan: -INR Goal: 2.0-2.5 -ASA Dose: none  Blood products: - 1u/PRBC  01/13/20  Adverse Events on VAD: - Admitted 08/02/19 due to N/V, and weakness. Creatinine elevated 2.7.   Plan/Recommendations:  1. Page VAD coordinator for any drive line or equipment issues 2. Weekly drive line dressing change per bedside RN 3. Pt ok to d/c. F/u 12/23 at 1100am in VAD clinic.  Carlton Adam RN VAD Coordinator  Office: [phone]  24/7 Pager: [phone]

## 2020-01-21 NOTE — Progress Notes (Signed)
CSW met with patient at bedside. Patient is beyond thrilled to go home. CSW made arrangements with [facility] to take patient home as he has no family to come from [place]. CSW also contacted Anderson Malta at [employer] [employer] [phone] to make arrangement for transport next week for his appointment in [place] clinic. Patient provided information for transport home and signed waiver. CSW continues to follow for supportive needs through the VAD clinic. [name], [place], [place]

## 2020-01-21 NOTE — Progress Notes (Signed)
ANTICOAGULATION CONSULT NOTE - Follow Up Consult  Pharmacy Consult for Heparin>Warfarin Indication: LVAD  No Known Allergies  Patient Measurements: Height: 5\' 5"  (165.1 cm) Weight: 53.6 kg (118 lb 2.7 oz) IBW/kg (Calculated) : 61.5 Heparin Dosing Weight: 50.7 kg  Vital Signs: Temp: 98.9 F (37.2 C) (12/17 0727) Temp Source: Oral (12/17 0727) BP: 97/87 (12/17 0726) Pulse Rate: 88 (12/17 0420)  Labs: Recent Labs    01/19/20 0013 01/20/20 0028 01/21/20 0057 01/21/20 0914  HGB 8.4* 8.0* 8.1*  --   HCT 26.2* 25.0* 24.1*  --   PLT 231 287 378  --   LABPROT 13.6 15.6* 16.2* 17.0*  INR 1.1 1.3* 1.4* 1.4*  HEPARINUNFRC 0.29* 0.20* <0.10*  --   CREATININE 0.75 0.80 0.77  --     Estimated Creatinine Clearance: 98.6 mL/min (by C-G formula based on SCr of 0.77 mg/dL).   Medical History: Past Medical History:  Diagnosis Date  . Dilated cardiomyopathy (HCC)   . Dilated cardiomyopathy (HCC) 02/2019  . Polysubstance abuse (HCC)     Medications:  Scheduled:  . ivabradine  5 mg Oral BID WC  . pantoprazole  40 mg Oral BID  . potassium chloride  40 mEq Oral Daily  . sildenafil  20 mg Oral TID  . Warfarin - Pharmacist Dosing Inpatient   Does not apply q1600    Assessment: 34 yom presenting after nausea, coughing up blood, and having low flows. Went to OSH - Hgb was 6, INR was elevated at 8 (received 2 units FFP).   PTA regimen is 5 mg daily except 2.5 mg MWF.   INR today remains subtherapeutic but trending up to 1.4. CBC and LDH stable. Pt to discharge home today with enoxaparin bridge. Pt reports having 6 enoxaparin syringes at home. INR check planned for Monday morning.  Goal of Therapy:  Heparin level goal 0.3-0.5>>aiming for <0.3 INR 2-2.5 Monitor platelets by anticoagulation protocol: Yes   Plan:  -Warfarin 7.5mg  x1 tonight 12/17 and 12/18 then 5mg  daily until INR check (Monday) -Enoxaparin 30mg  SQ q12h (~0.5 mg/kg) - will stop heparin and give a dose here prior  to D/C   , PharmD, BCPS, [facility] Clinical Pharmacist [phone] Please check AMION for all [facility] Pharmacy numbers 01/21/2020

## 2020-01-21 NOTE — Progress Notes (Signed)
Pt discharged home,  Pt to be taken to front of hospital via [facility].Marland Kitchen Pt understands about giving his lovenox injections every 12 hours, and will given his 2nd dose this evening for today. Pt has his equipment for LVAD and now has his medication from TOC.

## 2020-01-21 NOTE — Progress Notes (Addendum)
Patient ID: Jon Martin, male   DOB: 09/22/1985, 34 y.o.   MRN: [id number]   Advanced Heart Failure VAD Team Note  PCP-Cardiologist: Dr. Gala Romney   Patient Profile   [name] is from [facility] [place] with history of severe systolic HF due to NICM EF 15% and prior polysubstance abuse (cocaine and ETOH). 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   Admitted for ABLA (bleeding from mouth) in setting of supra therapeutic INR and had low flow alarms.   INR 8.0 on admit. Hgb 7.3>>6.5.    Subjective:    Received FFP + 1 unit of PRBCs on admit  Was having persistent low flows on VAD with rising LDH. No resolution with 2L IVF or increasing VAD speed. Alarms resolved with turning patient on his left side. CT with possible kink/occlusion of outflow graft, suspect this resolved with repositioning.   LDH 437 -> 387 -> 326 -> 233->224->215>209>189  Platelets Improving 55K -59K -> 78->124->231->378 Hgb 8.2 -> 8.5->8.5-> 8.4 ->8->8.1  RUQ u/s: normal  Back on coumadin + heparin gtt. INR 1.4 today Back on Sildenafil. MAPs 80s-low 90s  He feels well. No complaints. Eager to go home.   LVAD INTERROGATION:  HeartMate III LVAD:   Flow 3.9   liters/min, speed 5450 , power 4.0  PI 5.1  No low flow alarms. Multiple PI events   Objective:    Vital Signs:   Temp:  [98.7 F (37.1 C)-99.7 F (37.6 C)] 98.9 F (37.2 C) (12/17 0727) Pulse Rate:  [76-88] 88 (12/17 0420) Resp:  [17-20] 18 (12/17 0420) BP: (88-99)/(67-87) 97/87 (12/17 0726) SpO2:  [98 %-100 %] 100 % (12/17 0420) Weight:  [53.6 kg] 53.6 kg (12/17 0420) Last BM Date: 01/18/20 Mean arterial Pressure 80-90s   Intake/Output:   Intake/Output Summary (Last 24 hours) at 01/21/2020 1036 Last data filed at 01/21/2020 0400 Gross per 24 hour  Intake 653.02 ml  Output 3100 ml  Net -2446.98 ml     Physical Exam    Physical Exam: GENERAL: young male sitting up in bed. No acute distress. HEENT: normal  NECK: Supple, JVP flat.  2+  bilaterally, no bruits.  No lymphadenopathy or thyromegaly appreciated.  CARDIAC:  Mechanical heart sounds with LVAD hum present.  LUNGS:  Clear to auscultation bilaterally. No wheezing  ABDOMEN:  Soft, round, nontender, positive bowel sounds x4.     LVAD exit site:   Dressing dry and intact.  No erythema or drainage.  Stabilization device present and accurately applied.  Driveline dressing is being changed daily per sterile technique. EXTREMITIES:  Warm and dry, no cyanosis, clubbing, rash or edema, thin extremities   NEUROLOGIC:  Alert and oriented x 3.    No aphasia.  No dysarthria.  Affect pleasant.      Telemetry   NSR 90s. Brief runs of NSVT, longest 4 beats    Labs   Basic Metabolic Panel: Recent Labs  Lab 01/15/20 0101 01/16/20 0120 01/17/20 0105 01/18/20 0031 01/19/20 0013 01/20/20 0028 01/21/20 0057  NA 135   < > 135 137 135 136 137  K 3.9   < > 3.6 3.8 3.9 3.5 3.9  CL 104   < > 101 105 102 103 104  CO2 23   < > 25 24 25 22 24   GLUCOSE 102*   < > 100* 111* 107* 110* 103*  BUN <5*   < > <5* <5* 7 <5* <5*  CREATININE 0.67   < > 0.69 0.83 0.75 0.80 0.77  CALCIUM 9.2   < > 9.0 8.9 9.3 9.1 9.1  MG 1.3*  --  1.9 1.9 2.1  --   --    < > = values in this interval not displayed.    Liver Function Tests: Recent Labs  Lab 01/15/20 0101 01/17/20 0748  AST 306* 88*  ALT 136* 82*  ALKPHOS 59 75  BILITOT 1.3* 0.8  PROT 5.9* 6.2*  ALBUMIN 3.3* 3.3*   No results for input(s): LIPASE, AMYLASE in the last 168 hours. No results for input(s): AMMONIA in the last 168 hours.  CBC: Recent Labs  Lab 01/16/20 0120 01/17/20 0105 01/19/20 0013 01/20/20 0028 01/21/20 0057  WBC 5.7 7.1 7.7 8.5 7.3  HGB 8.5* 8.5* 8.4* 8.0* 8.1*  HCT 24.9* 25.0* 26.2* 25.0* 24.1*  MCV 96.9 97.7 100.4* 99.6 98.4  PLT 78* 124* 231 287 378    INR: Recent Labs  Lab 01/18/20 0031 01/19/20 0013 01/20/20 0028 01/21/20 0057 01/21/20 0914  INR 1.1 1.1 1.3* 1.4* 1.4*    Other  results:     Imaging   No results found.   Medications:     Scheduled Medications: . ivabradine  5 mg Oral BID WC  . pantoprazole  40 mg Oral BID  . potassium chloride  40 mEq Oral Daily  . sildenafil  20 mg Oral TID  . Warfarin - Pharmacist Dosing Inpatient   Does not apply q1600    Infusions: . heparin 800 Units/hr (01/21/20 0400)    PRN Medications: acetaminophen, ondansetron (ZOFRAN) IV, white petrolatum    Assessment/Plan:     1. Anemia/ Thrombocytopenia  - clearly had source of bleeding on lower lip but drop in hgb seems out of proportion. ?Hemolysis from kinking in graft.   - Bleeding resolved. Back on heparin drip + warfarin.   - No melena or other evidence of active bleeding.  - Hgb has been stable and plts up-trending.  - continue PPI - peripheral smear no evidence of hemolysis - RUQ u/s normal - doubt splenic sequestration - suspect possible obstruction/clot of outflow graft causing PLTs and HGb to drop, now resolved (see below)  2. Persistent low flow alarms with rising LDH and falling PLTs - CT 12/10 concern for outflow graft kink/clot - Low flow alarms resolved with patient on left side - Ramp echo with adequate cannula position. Pump sounds fine on exam - Suspect resolution of kinking with repositioning.  No further low flow alarms. LDH below 200. Stable.  - Back on warfarin. INR 1.4. d/c home on lovenox bridge   3. Supratherapeutic INR - INR 8 at [facility]   Given 2 units FFP prior to transfer.  - INR 1.4 today  - d/c home on lovenox bridge. Plan repeat INR check on Monday  4. Chronic Systolic HF due to NICM - Echo EF 10% with biventricular failure - HM-3 VAD implant 02/26/19 - Echo 7/21 EF 40-45% - Ramp echo 12/10 EF 35-40% - Maps stable.  - No bb or ARB with previous dehydration/RV failure.  - Contiknue sildenafil 20 tid  - Volume status stable.  - VAD parameters stable. Plan dressing change prior to d/c   5.  Hypokalemia/hypomag - K 3.9. - Mg 2.1 yesterday    7. Tachycardia - CBC stable.  Improved with addition of ivabradine.  - Volume status stable.  - Chest CT on admit negative for PE  - TSH stable.    I reviewed the LVAD parameters from today, and compared the results  to the patient's prior recorded data.  No programming changes were made.  The LVAD is functioning within specified parameters.  The patient performs LVAD self-test daily.  LVAD interrogation was negative for any significant power changes, alarms or PI events/speed drops.  LVAD equipment check completed and is in good working order.  Back-up equipment present.   LVAD education done on emergency procedures and precautions and reviewed exit site care.  Length of Stay: [address] PA-C 01/21/2020, 10:36 AM  VAD Team --- VAD ISSUES ONLY--- Pager [phone] (7am - 7am)  Advanced Heart Failure Team  Pager [phone] (M-F; 7a - 4p)  Please contact [facility] for night-coverage after hours (4p -7a ) and weekends on [email or web address]   Patient seen and examined with the above-signed Advanced Practice Provider and/or Housestaff. I personally reviewed laboratory data, imaging studies and relevant notes. I independently examined the patient and formulated the important aspects of the plan. I have edited the note to reflect any of my changes or salient points. I have personally discussed the plan with the patient and/or family.  Feels good. Eager to go home. Remains on heparin and warfarin. INR 1.4 No bleeding  Hgb stable  General:  NAD.  HEENT: normal  Neck: supple. JVP not elevated.  Carotids 2+ bilat; no bruits. No lymphadenopathy or thryomegaly appreciated. Cor: LVAD hum.  Lungs: Clear. Abdomen:  soft, nontender, non-distended. No hepatosplenomegaly. No bruits or masses. Good bowel sounds. Driveline site clean. Anchor in place.  Extremities: no cyanosis, clubbing, rash. Warm no edema  Neuro: alert & oriented x 3. No focal  deficits. Moves all 4 without problem   I think he can go home today with 2 days of 1/2 dose lovenox. VAD interrogated personally. Parameters stable. D/w Vad coordinators, SW and Pharmd.   Arvilla Meres, MD  11:10 AM

## 2020-01-24 ENCOUNTER — Other Ambulatory Visit (HOSPITAL_COMMUNITY): Payer: Self-pay | Admitting: *Deleted

## 2020-01-24 ENCOUNTER — Telehealth (HOSPITAL_COMMUNITY): Payer: Self-pay | Admitting: Cardiology

## 2020-01-24 ENCOUNTER — Ambulatory Visit (HOSPITAL_COMMUNITY): Payer: Self-pay | Admitting: Pharmacist

## 2020-01-24 DIAGNOSIS — I5022 Chronic systolic (congestive) heart failure: Secondary | ICD-10-CM

## 2020-01-24 DIAGNOSIS — Z7901 Long term (current) use of anticoagulants: Secondary | ICD-10-CM

## 2020-01-24 DIAGNOSIS — Z95811 Presence of heart assist device: Secondary | ICD-10-CM

## 2020-01-24 LAB — POCT INR: INR: 6.2 — AB (ref 2.0–3.0)

## 2020-01-24 MED ORDER — WARFARIN SODIUM 5 MG PO TABS: ORAL_TABLET | ORAL | 11 refills | Status: DC

## 2020-01-24 NOTE — Telephone Encounter (Signed)
Critical lab called from [facility] INR 6.23  Verbally given to VAD team

## 2020-01-24 NOTE — Progress Notes (Signed)
LVAD INR 

## 2020-01-27 ENCOUNTER — Ambulatory Visit (HOSPITAL_COMMUNITY)
Admit: 2020-01-27 | Discharge: 2020-01-27 | Disposition: A | Discharge status: Home / Self Care | Payer: 59 | Source: Ambulatory Visit | Attending: Internal Medicine | Admitting: Internal Medicine

## 2020-01-27 ENCOUNTER — Ambulatory Visit (HOSPITAL_COMMUNITY): Payer: Self-pay | Admitting: Pharmacist

## 2020-01-27 ENCOUNTER — Other Ambulatory Visit: Payer: Self-pay

## 2020-01-27 VITALS — BP 112/81 | HR 90 | Wt 118.6 lb

## 2020-01-27 DIAGNOSIS — D649 Anemia, unspecified: Secondary | ICD-10-CM | POA: Insufficient documentation

## 2020-01-27 DIAGNOSIS — I10 Essential (primary) hypertension: Secondary | ICD-10-CM

## 2020-01-27 DIAGNOSIS — I420 Dilated cardiomyopathy: Secondary | ICD-10-CM | POA: Diagnosis not present

## 2020-01-27 DIAGNOSIS — I110 Hypertensive heart disease with heart failure: Secondary | ICD-10-CM | POA: Insufficient documentation

## 2020-01-27 DIAGNOSIS — Z79899 Other long term (current) drug therapy: Secondary | ICD-10-CM | POA: Insufficient documentation

## 2020-01-27 DIAGNOSIS — Z7901 Long term (current) use of anticoagulants: Secondary | ICD-10-CM | POA: Diagnosis not present

## 2020-01-27 DIAGNOSIS — F1410 Cocaine abuse, uncomplicated: Secondary | ICD-10-CM | POA: Insufficient documentation

## 2020-01-27 DIAGNOSIS — I5082 Biventricular heart failure: Secondary | ICD-10-CM | POA: Insufficient documentation

## 2020-01-27 DIAGNOSIS — D75839 Thrombocytosis, unspecified: Secondary | ICD-10-CM | POA: Diagnosis not present

## 2020-01-27 DIAGNOSIS — I5022 Chronic systolic (congestive) heart failure: Secondary | ICD-10-CM | POA: Insufficient documentation

## 2020-01-27 DIAGNOSIS — Z95811 Presence of heart assist device: Secondary | ICD-10-CM | POA: Diagnosis not present

## 2020-01-27 DIAGNOSIS — T829XXA Unspecified complication of cardiac and vascular prosthetic device, implant and graft, initial encounter: Secondary | ICD-10-CM

## 2020-01-27 LAB — BASIC METABOLIC PANEL
Anion gap: 11 (ref 5–15)
BUN: 5 mg/dL — ABNORMAL LOW (ref 6–20)
CO2: 26 mmol/L (ref 22–32)
Calcium: 9.9 mg/dL (ref 8.9–10.3)
Chloride: 99 mmol/L (ref 98–111)
Creatinine, Ser: 0.8 mg/dL (ref 0.61–1.24)
GFR, Estimated: >60 mL/min (ref >60)
Glucose, Bld: 98 mg/dL (ref 70–99)
Potassium: 3.8 mmol/L (ref 3.5–5.1)
Sodium: 136 mmol/L (ref 135–145)

## 2020-01-27 LAB — CBC
HCT: 30.6 % — ABNORMAL LOW (ref 39.0–52.0)
Hemoglobin: 9.5 g/dL — ABNORMAL LOW (ref 13.0–17.0)
MCH: 30.4 pg (ref 26.0–34.0)
MCHC: 31 g/dL (ref 30.0–36.0)
MCV: 97.8 fL (ref 80.0–100.0)
Platelets: 709 10*3/uL — ABNORMAL HIGH (ref 150–400)
RBC: 3.13 MIL/uL — ABNORMAL LOW (ref 4.22–5.81)
RDW: 17.9 % — ABNORMAL HIGH (ref 11.5–15.5)
WBC: 8.6 10*3/uL (ref 4.0–10.5)
nRBC: 0 % (ref 0.0–0.2)

## 2020-01-27 LAB — PROTIME-INR
INR: 3.3 — ABNORMAL HIGH (ref 0.8–1.2)
Prothrombin Time: 32.7 seconds — ABNORMAL HIGH (ref 11.4–15.2)

## 2020-01-27 LAB — LACTATE DEHYDROGENASE: LDH: 211 U/L — ABNORMAL HIGH (ref 98–192)

## 2020-01-27 NOTE — Progress Notes (Signed)
LVAD INR 

## 2020-01-27 NOTE — Patient Instructions (Addendum)
1. No medication changes today 2. Coumadin dosing per Lauren PharmD 3. Return to clinic in 2 months  

## 2020-01-27 NOTE — Progress Notes (Signed)
VAD CLINIC NOTE  HPI:  [name] is a 34 y/o male from [place]  w/ h/o severe systolic HF due to NICM EF 15% and prior polysubstance abuse (cocaine and ETOH)  Underwent HM-3 LVAD placement on 02/26/19 by Dr. Morton Peters. Extubated 1/25. Required milrinone post op for RV support. Milrinone eventually weaned and Co-ox remained stable of inotropes at 61%. Later placed on sildenafil for continued RV support. Post op recovery complicated by hypoxic respiratory failure, moderate sized rt pneumothorax requiring CT placement.  Admitted 6/28-08/06/19 for AKI and hypotension in setting of volume depletion due to GI bug.Had multiple flow alarms. Echo EF 40-45%   Admitted 12/21 for anemia in setting of supratherapeutic INR (8.0) and bleeding from a sore on his lip. Transfused but did not bleed any further. Developed refractory low flow alarms on VAD with drop in platelets and rising LDH. RUQ U/S was normal (doubt splenic sequestration). Peripheral smear showed no evidence of hemolysis.Patient placed on his left side and low flow alarms resolved. It was suspected possible obstruction/clot of outflow graft causing Plts and Hgb to drop. CT 12/10 showed no evidence of outflow graft kink/clot.  LDH trended back down to his baseline, below 200s. Plts started to trend back up.  Patient presents for hosp follow up  in VAD Clinic today alone. Reports no problems with VAD equipment or concerns with drive line. No further low flow alarms. Feels good. Appetite much better. Denies orthopnea or PND. No fevers, chills or problems with driveline. No bleeding, melena or neuro symptomsvl. He did mess up his coumadin dosing and had an increase in his INR. Pt made the approriate changes to his medications. INR 3.3 today.   VAD Indication: Destination Therapy LVAD due to   LVAD assessment: HM III: Speed:  5400 rpms                                 Flow: 2.8                                                Power: 4.0w                                            PI: 9.8                                                     Alarms: none Events: 30-40 PI events  Fixed speed: 5400 Low speed limit: 5100  Primary Controller: Replace back up battery in16 months Back up controller: Replace back up battery in 63months  I reviewed the LVAD parameters from todayand compared the results to the patient's prior recorded data.LVAD interrogation was NEGATIVEfor significant power changes, NEGATIVEfor clinicalalarms and STABLEfor PI events/speed drops. No programming changes were madeand pump is functioning within specified parameters. Pt is performing daily controller and system monitor self tests along with completing weekly and monthly maintenance for LVAD equipment.    Past Medical History:  Diagnosis Date   Dilated  cardiomyopathy (HCC)    Dilated cardiomyopathy (HCC) 02/2019   Polysubstance abuse (HCC)     Current Outpatient Medications  Medication Sig Dispense Refill   ivabradine (CORLANOR) 5 MG TABS tablet Take 1 tablet (5 mg total) by mouth 2 (two) times daily with a meal. 60 tablet 5   pantoprazole (PROTONIX) 40 MG tablet Take 1 tablet (40 mg total) by mouth daily. 30 tablet 11   potassium chloride SA (KLOR-CON M20) 20 MEQ tablet Take 2 tablets (40 mEq total) by mouth daily. Pt needs to take 80 twice today only for a potassium of 2.6 (Patient taking differently: Take 40 mEq by mouth daily.) 90 tablet 3   sildenafil (REVATIO) 20 MG tablet Take 1 tablet (20 mg total) by mouth 3 (three) times daily. 90 tablet 5   warfarin (COUMADIN) 5 MG tablet Take warfarin 2.5 mg (1/2 tablet) every Monday/Wednesday/Friday and 5 mg (1 tablet) all other days or as directed by HF Clinic. Only use NDC [id number]. 30 tablet 11   enoxaparin (LOVENOX) 30 MG/0.3ML injection Inject 0.3 mLs (30 mg total) into the skin 2 (two) times daily. (Patient not taking: No sig reported) 3 mL 0   No current  facility-administered medications for this encounter.    Patient has no known allergies.   Vitals:   01/27/20 1056 01/27/20 1059  BP: (!) 106/0 112/81  Pulse: 90   SpO2: 100%   Weight: 53.8 kg (118 lb 9.6 oz)      Vital Signs:  Doppler Pressure:106 Automatc BP: 112/81 (91) HR: 90 SPO2: 100%  Weight: 118.6 lb w/o eqt Last weight: 118 lb    Physical Exam: General:  Thin male NAD.  HEENT: normal  Neck: supple. JVP not elevated.  Carotids 2+ bilat; no bruits. No lymphadenopathy or thryomegaly appreciated. Cor: LVAD hum.  Lungs: Clear. Abdomen: obese soft, nontender, non-distended. No hepatosplenomegaly. No bruits or masses. Good bowel sounds. Driveline site clean. Anchor in place.  Extremities: no cyanosis, clubbing, rash. Warm no edema  Neuro: alert & oriented x 3. No focal deficits. Moves all 4 without problem     ASSESSMENT AND PLAN:   1. Chronic systolic HF due to severe NICM - Echo EF 10% with biventricular failure - HM-3 VAD implant 02/26/19 - Echo 7/21 EF 40-45% - Stable NYHA I  - Continue VAD speed at 5400 - Continue sildenafil - MAPs ok. Continue current regimen    2. VAD - Admit 12/21 with multiple low flow alarms. Rising LDH and decreasing PLTs. Suspected outflow graft kink occlusion. Patient placed on his left side and resolved.  - VAD interrogated personally. Parameters stable. No low flows  - DL site looks good - INR 3.3. Discussed dosing with PharmD personally. - LDH 211  Continue ASA on top of warfarin for now as LDH recently up > 300 - Hgb 9.5  3. HTN - MAPs ok   4. Thrombocytosis - no obvious cause. Recent peripheral smear ok - follow  Total time spent 35 minutes. Over half that time spent discussing above.    Arvilla Meres, MD  6:24 PM

## 2020-01-27 NOTE — Addendum Note (Signed)
Encounter addended by: Dolores Patty, MD on: 01/27/2020 6:32 PM  Actions taken: Clinical Note Signed, Level of Service modified, Visit diagnoses modified, Charge Capture section accepted

## 2020-01-27 NOTE — Progress Notes (Signed)
Patient presents for hosp follow up  in VAD Clinic today alone. Reports no problems with VAD equipment or concerns with drive line.  He denies any dizziness, lightheadedness, syncope, SOB, pedal edema, abdominal swelling, orthopnea, PND, or palpitations.   Pt states that he has been doing well since d/c from the hospital. He did mess up his coumadin dosing and had an increase in his INR. Pt made the approriate changes to his medications. INR 3.3 today.    Vital Signs:  Doppler Pressure: 106 Automatc BP: 112/81 (91) HR: 90 SPO2: 100%  Weight: 118.6 lb w/o eqt Last weight: 118 lb   VAD Indication: Destination Therapy LVAD due to   LVAD assessment: HM III: Speed:  5400 rpms                                 Flow: 2.8       Power: 4.0w                                              PI: 9.8                                                     Alarms: none Events: 30-40 PI events  Fixed speed: 5400 Low speed limit: 5100  Primary Controller: Replace back up battery in 16 months Back up controller: Replace back up battery in 23months  I reviewed the LVAD parameters from today and compared the results to the patient's prior recorded data. LVAD interrogation was NEGATIVE for significant power changes, NEGATIVE for clinical alarms and STABLE for PI events/speed drops. No programming changes were made and pump is functioning within specified parameters. Pt is performing daily controller and system monitor self tests along with completing weekly and monthly maintenance for LVAD equipment.  LVAD equipment check completed and is in good working order. Back-up equipment present.   Annual Equipment Maintenance on UBC/PM was performed 2/21.  Exit Site Care: VAD dressing maintained by patient weekly. Existing VAD dressing removed and site care performed using sterile technique. Drive line exit site cleaned with Chlora prep applicators x 2, allowed to dry, and Sorbaview dressing with bio  patch re-applied. Exit site well healed and incorporated.The velour is fully implanted at exit site. Small amount of bloody drainage noted; pt denies any known trauma. No redness, tenderness, or foul odor noted. Drive line anchor intact. Pt denies fever or chills. Continue weekly dressing changes. Provided patient with 8 weekly dressing kits and 6 anchors for home use.   Device: N/A  BP & Labs:  Doppler BP 106 - Doppler is reflecting Modified systolic  Hgb 9.5- No S/S of bleeding. Specifically denies melena/BRBPR or nosebleeds.  LDH stable at 211 with established baseline of 250 - 430. Denies tea-colored urine. No power elevations noted on interrogation.     Patient Instructions: 1. No medication changes today 2. Coumadin dosing per Leotis Shames PharmD 3. Return to clinic in 2 months    Jon Adam RN VAD Coordinator  Office: [phone]  24/7 Pager: [phone]

## 2020-02-03 ENCOUNTER — Telehealth (HOSPITAL_COMMUNITY): Payer: Self-pay | Admitting: Pharmacist

## 2020-02-03 NOTE — Telephone Encounter (Signed)
Patient was supposed to get INR drawn this week at [facility]. Called patient multiple times, but was unable to talk with patient or leave a VM. Will follow up next week.   LVAD INR

## 2020-02-07 ENCOUNTER — Ambulatory Visit (HOSPITAL_COMMUNITY): Payer: Self-pay | Admitting: Pharmacist

## 2020-02-07 LAB — POCT INR: INR: 3.7 — AB (ref 2.0–3.0)

## 2020-02-07 MED ORDER — WARFARIN SODIUM 5 MG PO TABS: ORAL_TABLET | ORAL | 11 refills | Status: DC

## 2020-02-07 NOTE — Progress Notes (Signed)
LVAD INR 

## 2020-02-16 ENCOUNTER — Telehealth (HOSPITAL_COMMUNITY): Payer: Self-pay | Admitting: Pharmacist

## 2020-02-16 NOTE — Telephone Encounter (Signed)
Patient is due for an INR draw. Unable to reach patient or leave a VM. Will continue to reach out to patient.   Karle Plumber, PharmD, BCPS, BCCP, CPP Heart Failure Clinic Pharmacist [phone]

## 2020-02-22 ENCOUNTER — Telehealth (HOSPITAL_COMMUNITY): Payer: Self-pay | Admitting: *Deleted

## 2020-02-22 NOTE — Telephone Encounter (Signed)
I attempted to call all numbers listed in patient's chart to speak with him re: need for INR draw this week. The home phone number is disconnected, his cell phone does not have voicemail set up, his brother's phone goes straight to a voicemail that is not set up, and his sister's phone rings, but the voicemail is full, so I was unable to leave a message at any of his contact numbers.   Alyce Pagan RN VAD Coordinator  Office: [phone]  24/7 Pager: [phone]

## 2020-03-02 ENCOUNTER — Telehealth (HOSPITAL_COMMUNITY): Payer: Self-pay | Admitting: Pharmacist

## 2020-03-02 NOTE — Telephone Encounter (Signed)
Called patient again to remind him of his INR appointment (talked with him last week and he assured me he was getting it drawn locally on 02/29/20 - unable to go before then due to the weather/snow). Unable to reach patient. Called [facility] lab and they confirmed patient never came in to have his INR drawn.   Karle Plumber, PharmD, BCPS, BCCP, CPP Heart Failure Clinic Pharmacist [phone]

## 2020-03-06 ENCOUNTER — Encounter (HOSPITAL_COMMUNITY): Payer: Self-pay | Admitting: Unknown Physician Specialty

## 2020-03-09 ENCOUNTER — Ambulatory Visit (HOSPITAL_COMMUNITY): Payer: Self-pay | Admitting: Pharmacist

## 2020-03-09 LAB — POCT INR: INR: 3.3 — AB (ref 2.0–3.0)

## 2020-03-09 NOTE — Progress Notes (Signed)
LVAD INR 

## 2020-03-27 ENCOUNTER — Telehealth (HOSPITAL_COMMUNITY): Payer: Self-pay | Admitting: Unknown Physician Specialty

## 2020-03-27 ENCOUNTER — Other Ambulatory Visit (HOSPITAL_COMMUNITY): Payer: Self-pay | Admitting: Unknown Physician Specialty

## 2020-03-27 DIAGNOSIS — Z7901 Long term (current) use of anticoagulants: Secondary | ICD-10-CM

## 2020-03-27 DIAGNOSIS — Z95811 Presence of heart assist device: Secondary | ICD-10-CM

## 2020-03-27 NOTE — Telephone Encounter (Signed)
I attempted to call all numbers listed in patient's chart to speak with him re: to remind him of his appt this week and to bring his equipment to clinic. The home phone number is disconnected, his cell phone does not have voicemail set up, his brother's phone goes straight to a voicemail that is not set up, and his sister's phone goes straight to the voicemail but the VM box is full, so I was unable to leave a message at any of his contact numbers.   Carlton Adam RN, BSN VAD Coordinator 24/7 Pager [phone]

## 2020-03-29 ENCOUNTER — Other Ambulatory Visit: Payer: Self-pay

## 2020-03-29 ENCOUNTER — Ambulatory Visit (HOSPITAL_COMMUNITY)
Admission: RE | Admit: 2020-03-29 | Discharge: 2020-03-29 | Disposition: A | Discharge status: Home / Self Care | Payer: Medicaid - Out of State | Source: Ambulatory Visit | Attending: Cardiology | Admitting: Cardiology

## 2020-03-29 ENCOUNTER — Ambulatory Visit (HOSPITAL_COMMUNITY): Payer: Self-pay | Admitting: Pharmacist

## 2020-03-29 ENCOUNTER — Encounter (HOSPITAL_COMMUNITY): Payer: Self-pay

## 2020-03-29 VITALS — BP 130/85 | HR 85 | Wt 123.4 lb

## 2020-03-29 DIAGNOSIS — Z95811 Presence of heart assist device: Secondary | ICD-10-CM | POA: Insufficient documentation

## 2020-03-29 DIAGNOSIS — I420 Dilated cardiomyopathy: Secondary | ICD-10-CM | POA: Diagnosis not present

## 2020-03-29 DIAGNOSIS — Z79899 Other long term (current) drug therapy: Secondary | ICD-10-CM | POA: Insufficient documentation

## 2020-03-29 DIAGNOSIS — D75839 Thrombocytosis, unspecified: Secondary | ICD-10-CM | POA: Diagnosis not present

## 2020-03-29 DIAGNOSIS — L259 Unspecified contact dermatitis, unspecified cause: Secondary | ICD-10-CM | POA: Insufficient documentation

## 2020-03-29 DIAGNOSIS — D508 Other iron deficiency anemias: Secondary | ICD-10-CM

## 2020-03-29 DIAGNOSIS — I5022 Chronic systolic (congestive) heart failure: Secondary | ICD-10-CM | POA: Diagnosis not present

## 2020-03-29 DIAGNOSIS — D691 Qualitative platelet defects: Secondary | ICD-10-CM

## 2020-03-29 DIAGNOSIS — T829XXA Unspecified complication of cardiac and vascular prosthetic device, implant and graft, initial encounter: Secondary | ICD-10-CM | POA: Diagnosis not present

## 2020-03-29 DIAGNOSIS — D649 Anemia, unspecified: Secondary | ICD-10-CM | POA: Insufficient documentation

## 2020-03-29 DIAGNOSIS — I5082 Biventricular heart failure: Secondary | ICD-10-CM | POA: Insufficient documentation

## 2020-03-29 DIAGNOSIS — I110 Hypertensive heart disease with heart failure: Secondary | ICD-10-CM | POA: Diagnosis not present

## 2020-03-29 DIAGNOSIS — Z7901 Long term (current) use of anticoagulants: Secondary | ICD-10-CM | POA: Diagnosis present

## 2020-03-29 LAB — CBC
HCT: 29.4 % — ABNORMAL LOW (ref 39.0–52.0)
Hemoglobin: 9.3 g/dL — ABNORMAL LOW (ref 13.0–17.0)
MCH: 28.7 pg (ref 26.0–34.0)
MCHC: 31.6 g/dL (ref 30.0–36.0)
MCV: 90.7 fL (ref 80.0–100.0)
Platelets: 92 10*3/uL — ABNORMAL LOW (ref 150–400)
RBC: 3.24 MIL/uL — ABNORMAL LOW (ref 4.22–5.81)
RDW: 23.3 % — ABNORMAL HIGH (ref 11.5–15.5)
WBC: 4.2 10*3/uL (ref 4.0–10.5)
nRBC: 0 % (ref 0.0–0.2)

## 2020-03-29 LAB — COMPREHENSIVE METABOLIC PANEL
ALT: 32 U/L (ref 0–44)
AST: 107 U/L — ABNORMAL HIGH (ref 15–41)
Albumin: 3.7 g/dL (ref 3.5–5.0)
Alkaline Phosphatase: 76 U/L (ref 38–126)
Anion gap: 12 (ref 5–15)
BUN: 5 mg/dL — ABNORMAL LOW (ref 6–20)
CO2: 23 mmol/L (ref 22–32)
Calcium: 8.9 mg/dL (ref 8.9–10.3)
Chloride: 102 mmol/L (ref 98–111)
Creatinine, Ser: 0.71 mg/dL (ref 0.61–1.24)
GFR, Estimated: >60 mL/min (ref >60)
Glucose, Bld: 91 mg/dL (ref 70–99)
Potassium: 3.7 mmol/L (ref 3.5–5.1)
Sodium: 137 mmol/L (ref 135–145)
Total Bilirubin: 0.5 mg/dL (ref 0.3–1.2)
Total Protein: 7.3 g/dL (ref 6.5–8.1)

## 2020-03-29 LAB — PROTIME-INR
INR: 6.2 (ref 0.8–1.2)
Prothrombin Time: 53.4 seconds — ABNORMAL HIGH (ref 11.4–15.2)

## 2020-03-29 LAB — LACTATE DEHYDROGENASE: LDH: 222 U/L — ABNORMAL HIGH (ref 98–192)

## 2020-03-29 LAB — PREALBUMIN: Prealbumin: 24.8 mg/dL (ref 18–38)

## 2020-03-29 MED ORDER — WARFARIN SODIUM 5 MG PO TABS: ORAL_TABLET | ORAL | 11 refills | Status: DC

## 2020-03-29 MED ORDER — POTASSIUM CHLORIDE CRYS ER 20 MEQ PO TBCR: 40.0000 meq | EXTENDED_RELEASE_TABLET | Freq: Every day | ORAL | 4 refills | Status: DC

## 2020-03-29 NOTE — Progress Notes (Signed)
Patient presents for 2 month follow up with 1 year Intermacs and annual maintenance in VAD Clinic today alone. Reports no problems with VAD equipment or concerns with drive line.  He denies any dizziness, lightheadedness, syncope, SOB, pedal edema, abdominal swelling, orthopnea, PND, or palpitations.   Reports he has not been taking "the pills that were in the white bottle. I brought it to the pharmacy and they never gave me a refill." I believe this was probably his Corlanor as he has pill bottles with him for all other prescribed medications except Corlanor. HR 85 SR today. Will stop Corlanor at this time per [name] NP.   No alarms noted on interrogation log. Per controller interrogation patient had 3 asymptomatic LOW FLOWS on 02/17/20. None noted since. 60-100 PI events noted per day. MAP slightly elevated; will continue to monitor at this time. Pt reports good PO intake.   Pt does his own dressing change. Dressing and anchor falling off on arrival today. Skin under dressing and tracking onto his flank is itchy with a large amount of flaky skin present. Burning noted with cleansing. Will switch to gauze dressing with silk tape. See dressing change documentation below. Instructed patient he may pick up OTC Benadryl to take as needed for itching. He verbalized understanding.   All his equipment is tangled and knotted Severe bend noted on drive line. Equipment untangled. Provided patient with new holster vest today. Instructed to make sure he keeps equipment organized to prevent drive line or power cord fracture.   Pt does not have black power cable for MPU, stating "I never got one." Pt was sent home with all equipment/cords after implant hospitalization. He has been using UBC power cord for both UBC and MPU. Provided with new power cable for MPU today. All equipment sticky and covered with dust/dirt. All equipment thoroughly cleaned.   Confirmed correct phone number listed in chart. Home phone has  been disconnected; removed number from chart. Updated his brother's phone number. He reports that his brother uses his phone sometimes, and does not know how to answer call waiting, or forgets to tell him he had a missed call. Reiterated importance of [place] being able to reach him; requested he set up voicemail. He verbalized understanding.   Refill sent in for potassium.    Vital Signs:  Doppler Pressure: 96 Automatc BP: 130/85 (102) HR: 85 SR SPO2: 100%  Weight: 123.4 lb w/o eqt Last weight: 118.6 lb   VAD Indication: Destination Therapy LVAD due to   LVAD assessment: HM III: Speed:  5400 rpms                                 Flow: 3.6       Power: 3.8w                                              PI: 6.3                                                     Alarms: none Events: 60 PI events so far today. 100 + yesterday  Fixed speed: 5400 Low speed  limit: 5100  Primary Controller: Replace back up battery in 14 months Back up controller: Replace back up battery in 27months -- did not bring today  I reviewed the LVAD parameters from today and compared the results to the patient's prior recorded data. LVAD interrogation was NEGATIVE for significant power changes, NEGATIVE for clinical alarms and STABLE for PI events/speed drops. No programming changes were made and pump is functioning within specified parameters. Pt is performing daily controller and system monitor self tests along with completing weekly and monthly maintenance for LVAD equipment.  LVAD equipment check completed and is in good working order. Back-up equipment present.   Annual Equipment Maintenance on UBC/PM was performed 2/22.  Exit Site Care: VAD dressing maintained by patient weekly. Existing VAD dressing removed and site care performed using sterile technique. Drive line exit site cleaned with Chlora prep applicators x 1, allowed to dry. Pt notes significant burning. Cleansed with sterile saline wipe x 2,  allowed to dry and gauze dressing re-applied. Large amount of skin irritation and flaking noted under dressing and anchor site. Silk tape used to adhere dressing. Exit site well healed and incorporated.The velour is fully implanted at exit site. Small amount of bloody drainage noted; pt denies any known trauma. No redness, tenderness, or foul odor noted. Drive line anchor intact. Pt denies fever or chills. Continue weekly dressing changes. Provided patient with 6 daily dressing kits and 10 anchors for home use.    Device: N/A  BP & Labs:  Doppler BP 96 - Doppler is reflecting MAP  Hgb 9.3- No S/S of bleeding. Specifically denies melena/BRBPR or nosebleeds.  LDH stable at 222 with established baseline of 250 - 430. Denies tea-colored urine. No power elevations noted on interrogation.    Platelets: 92 Amy NP aware. Will get full labs when he returns in 2 weeks.   INR 6.2 Pt denies any bleeding except occasional bleeding from his lip "because I pick at it." Reports taking correct dose of Coumadin. Lauren PharmD spoke with pharmacy to confirm he was given correct manufacturer of Coumadin when he recently picked up refill; he was. Will hold for 3 days, then resume lower dose of 2.5 mg daily. INR check on Tuesday. Instructions printed on AVS and verbally discussed x 2 with patient. He verbalized understanding.   Batteries Manufacture Date: Number of uses: Re-calibration  10/16/18 55-95 Performed by patient- discussed how to calibrate batteries at home. Pt verbalized understanding.   (Pt only brought 4 batteries today.)  Annual maintenance completed per [employer] on patient's home MPU and universal Magazine features editor.    Backup system controller not present today.   1 year Intermacs follow up completed including:  Quality of Life, KCCQ-12, and Neurocognitive trail making.   Pt completed 1650 feet during 6 minute walk.  [facility] Cardiomyopathy Questionnaire  KCCQ-12 03/29/2020 08/26/2019  1 a.  Ability to shower/bathe Other, Did not do Not at all limited  1 b. Ability to walk 1 block Extremely limited Not at all limited  1 c. Ability to hurry/jog Extremely limited Not at all limited  2. Edema feet/ankles/legs Never over the past 2 weeks -  3. Limited by fatigue Never over the past 2 weeks Never over the past 2 weeks  4. Limited by dyspnea Never over the past 2 weeks Never over the past 2 weeks  5. Sitting up / on 3+ pillows Never over the past 2 weeks Never over the past 2 weeks  6. Limited enjoyment of life  Not limited at all Not limited at all  7. Rest of life w/ symptoms Not at all satisfied Not at all satisfied  8 a. Participation in hobbies N/A, did not do for other reasons Did not limit at all  8 b. Participation in chores N/A, did not do for other reasons Did not limit at all  8 c. Visiting family/friends N/A, did not do for other reasons Did not limit at all    Patient Instructions: 1. No medication changes today  2. Coumadin dosing per Leotis Shames PharmD. HOLD Warfarin for 3 days. On Saturday begin 1/2 tablet (2.5 mg) every day. Please go and get INR checked next Tuesday.  3. Change drive line dressing weekly using gauze dressing 4. Return to VAD clinic in 2 weeks 5. You may pick up over the counter Benadryl to take for itching. Take as directed on box.    Alyce Pagan RN VAD Coordinator  Office: [phone]  24/7 Pager: [phone]

## 2020-03-29 NOTE — Progress Notes (Signed)
LVAD INR 

## 2020-03-29 NOTE — Progress Notes (Signed)
VAD CLINIC NOTE HF/LVAD MD: Dr Gala Romney   HPI: [name] is a 35 y/o male from [place]  w/ h/o severe systolic HF due to NICM EF 15% and prior polysubstance abuse (cocaine and ETOH), and anemia.   Underwent HM-3 LVAD placement on 02/26/19 by Dr. Morton Peters. Extubated 1/25. Required milrinone post op for RV support. Milrinone eventually weaned and Co-ox remained stable of inotropes at 61%. Later placed on sildenafil for continued RV support. Post op recovery complicated by hypoxic respiratory failure, moderate sized rt pneumothorax requiring CT placement.  Admitted 6/28-08/06/19 for AKI and hypotension in setting of volume depletion due to GI bug.Had multiple flow alarms. Echo EF 40-45%   Admitted 12/21 for anemia in setting of supratherapeutic INR (8.0) and bleeding from a sore on his lip. Transfused but did not bleed any further. Developed refractory low flow alarms on VAD with drop in platelets and rising LDH. RUQ U/S was normal (doubt splenic sequestration). Peripheral smear showed no evidence of hemolysis.Patient placed on his left side and low flow alarms resolved. It was suspected possible obstruction/clot of outflow graft causing Plts and Hgb to drop. CT 12/10 showed no evidence of outflow graft kink/clot.  LDH trended back down to his baseline, below 200s. Plts started to trend back up.  Today he returns for routine VAD/HF follow up. Overall feeling ok. Has had a rash under drive line dressing for few weeks.  No fever or chills. Denies SOB/PND/Orthopnea. No bleeding issues. No dizziness. He has been out of corlanor for some time. Taking all other medications. His brother  lives with him. He uses Medicaid transportation to get to appointments.   VAD Indication: Destination Therapy LVAD due to   LVAD assessment: HM III: Speed:  5400 rpms                                 Flow: 3.9                                               Power: 3.9                                            PI: 5.5                                                   Alarms: none Events: 60-100 PI events 3 low flows on 02/17/20   Fixed speed: 5400 Low speed limit: 5100  Primary Controller: Replace back up battery in16 months Back up controller: Replace back up battery in 63months  I reviewed the LVAD parameters from todayand compared the results to the patient's prior recorded data.LVAD interrogation was NEGATIVEfor significant power changes, NEGATIVEfor clinicalalarms and STABLEfor PI events/speed drops. No programming changes were madeand pump is functioning within specified parameters. Pt is performing daily controller and system monitor self tests along with completing weekly and monthly maintenance for LVAD equipment.    Past Medical History:  Diagnosis Date  . Dilated cardiomyopathy (HCC)   .  Dilated cardiomyopathy (HCC) 02/2019  . Polysubstance abuse (HCC)     Current Outpatient Medications  Medication Sig Dispense Refill  . pantoprazole (PROTONIX) 40 MG tablet Take 1 tablet (40 mg total) by mouth daily. 30 tablet 11  . sildenafil (REVATIO) 20 MG tablet Take 1 tablet (20 mg total) by mouth 3 (three) times daily. 90 tablet 5  . warfarin (COUMADIN) 5 MG tablet Take warfarin 2.5 mg (1/2 tablet) every Sunday/Monday/Wednesday/Friday and 5 mg (1 tablet) all other days or as directed by HF Clinic. Only use NDC [phone]. 30 tablet 11  . enoxaparin (LOVENOX) 30 MG/0.3ML injection Inject 0.3 mLs (30 mg total) into the skin 2 (two) times daily. (Patient not taking: No sig reported) 3 mL 0  . potassium chloride SA (KLOR-CON M20) 20 MEQ tablet Take 2 tablets (40 mEq total) by mouth daily. 60 tablet 4   No current facility-administered medications for this encounter.    Patient has no known allergies.   Vitals:   03/29/20 1212 03/29/20 1214  BP: (!) 96/0 130/85  Pulse: 85   Weight: 56 kg (123 lb 6.4 oz)      Vitals:   03/29/20 1212 03/29/20 1214  BP: (!) 96/0  130/85  Pulse: 85     Physical Exam: General:  Thin male NAD.  HEENT: normal  Neck: supple. JVP not elevated.  Carotids 2+ bilat; no bruits. No lymphadenopathy or thryomegaly appreciated. Cor: LVAD hum.  Lungs: Clear. Abdomen: obese soft, nontender, non-distended. No hepatosplenomegaly. No bruits or masses. Good bowel sounds. Driveline with contact dermatis around driveline.   Extremities: no cyanosis, clubbing, rash. Warm no edema  Neuro: alert & oriented x 3. No focal deficits. Moves all 4 without problem    ASSESSMENT AND PLAN:  1. Chronic systolic HF due to severe NICM - Echo EF 10% with biventricular failure - HM-3 VAD implant 02/26/19 - Echo 7/21 EF 40-45% - Stable NYHA I . Volume status stable. Does not need diuretics.  - He has not been taking corlanor. Can stay off corlanor. Heart rate in the 80s - Continue VAD speed at 5400. - Continue sildenafil -Maps stable.   2. VAD - Admit 12/21 with multiple low flow alarms. Rising LDH and decreasing PLTs. Suspected outflow graft kink occlusion. Patient placed on his left side and resolved.  - VAD interrogated personally. Parameters stable. No low flows  - VAD cables twisted . No open areas along driveline.  - INR  Discussed dosing with PharmD personally. - LDH stable.  - No ASA.  -INR 6.2 --- Not sure what happened. No change in the notes. Discussed with pharmacy. Plan to stay off coumadin for three days then he will take 2.5 mg coumadin daily.   3. HTN - MAPs ok   4. Thrombocytosis - no obvious cause. Recent peripheral smear ok - follow  5. Contact Dermatitis Note around VAD driveline. Will stop occlusive dressing and change dry gauze with silk tape. Continue to change weekly for now. WBC is not elevated. No indication for antibiotics.   6. Anemia  Hgb 9.3. No obvious source of bleeding. INR high. Holding coumadin x3 days.   7. Platelets Low Down to 92. Will watch. This is happened the last time he was admitted with  supra therapeutic INR. Will repeat CBC next visit.    He will check INR and CBC next week.  Follow up in 2 weeks to reassess driveline, check CBC, INR.  Discussed lab work and have stressed that he needs  to avoid falls with elevated INR.  Greater than 50% of the (total minutes 45) visit spent in counseling/coordination of care regarding the above.     See VAD Coordinator Note.   [name], NP  12:43 PM

## 2020-03-29 NOTE — Patient Instructions (Addendum)
1. No medication changes today  2. Coumadin dosing per Leotis Shames PharmD. HOLD Warfarin for 3 days. On Saturday begin 1/2 tablet (2.5 mg) every day. Please go and get INR checked next Tuesday.  3. Change drive line dressing weekly using gauze dressing 4. Return to VAD clinic in 2 weeks 5. You may pick up over the counter Benadryl to take for itching. Take as directed on box.

## 2020-04-04 ENCOUNTER — Telehealth (HOSPITAL_COMMUNITY): Payer: Self-pay | Admitting: *Deleted

## 2020-04-04 NOTE — Telephone Encounter (Signed)
Spoke with patient re: need for INR draw. (He was 6.2 last week.) Instructed patient to have labs drawn tomorrow. He verbalized understanding.   Alyce Pagan RN VAD Coordinator  Office: [phone]  24/7 Pager: [phone]

## 2020-04-06 ENCOUNTER — Ambulatory Visit (HOSPITAL_COMMUNITY): Payer: Self-pay | Admitting: Pharmacist

## 2020-04-06 LAB — POCT INR: INR: 1.6 — AB (ref 2.0–3.0)

## 2020-04-06 NOTE — Progress Notes (Signed)
LVAD INR 

## 2020-04-13 ENCOUNTER — Other Ambulatory Visit (HOSPITAL_COMMUNITY): Payer: Self-pay | Admitting: *Deleted

## 2020-04-13 DIAGNOSIS — Z7901 Long term (current) use of anticoagulants: Secondary | ICD-10-CM

## 2020-04-13 DIAGNOSIS — I5022 Chronic systolic (congestive) heart failure: Secondary | ICD-10-CM

## 2020-04-13 DIAGNOSIS — Z95811 Presence of heart assist device: Secondary | ICD-10-CM

## 2020-04-14 ENCOUNTER — Encounter (HOSPITAL_COMMUNITY): Payer: 59

## 2020-04-18 ENCOUNTER — Encounter (HOSPITAL_COMMUNITY): Payer: 59

## 2020-04-26 ENCOUNTER — Encounter (HOSPITAL_COMMUNITY): Payer: 59

## 2020-04-27 ENCOUNTER — Other Ambulatory Visit (HOSPITAL_COMMUNITY): Payer: Self-pay | Admitting: *Deleted

## 2020-04-27 DIAGNOSIS — Z7901 Long term (current) use of anticoagulants: Secondary | ICD-10-CM

## 2020-04-27 DIAGNOSIS — Z95811 Presence of heart assist device: Secondary | ICD-10-CM

## 2020-04-28 ENCOUNTER — Encounter (HOSPITAL_COMMUNITY): Payer: Self-pay

## 2020-04-28 ENCOUNTER — Inpatient Hospital Stay (HOSPITAL_COMMUNITY)
Admission: AD | Admit: 2020-04-28 | Discharge: 2020-05-25 | DRG: 982 | Disposition: A | Discharge status: Skilled Nursing Facility | Payer: Medicaid - Out of State | Source: Ambulatory Visit | Attending: Internal Medicine | Admitting: Internal Medicine

## 2020-04-28 ENCOUNTER — Ambulatory Visit (HOSPITAL_COMMUNITY): Payer: Self-pay | Admitting: Pharmacist

## 2020-04-28 ENCOUNTER — Other Ambulatory Visit: Payer: Self-pay

## 2020-04-28 ENCOUNTER — Ambulatory Visit (HOSPITAL_COMMUNITY)
Admission: RE | Admit: 2020-04-28 | Discharge: 2020-04-28 | Disposition: A | Discharge status: Home / Self Care | Payer: Medicaid - Out of State | Source: Ambulatory Visit

## 2020-04-28 VITALS — BP 107/69 | HR 115 | Wt 116.2 lb

## 2020-04-28 DIAGNOSIS — S025XXA Fracture of tooth (traumatic), initial encounter for closed fracture: Secondary | ICD-10-CM | POA: Diagnosis present

## 2020-04-28 DIAGNOSIS — L03311 Cellulitis of abdominal wall: Secondary | ICD-10-CM | POA: Diagnosis present

## 2020-04-28 DIAGNOSIS — E440 Moderate protein-calorie malnutrition: Secondary | ICD-10-CM | POA: Insufficient documentation

## 2020-04-28 DIAGNOSIS — L02211 Cutaneous abscess of abdominal wall: Secondary | ICD-10-CM | POA: Diagnosis present

## 2020-04-28 DIAGNOSIS — A4901 Methicillin susceptible Staphylococcus aureus infection, unspecified site: Secondary | ICD-10-CM | POA: Insufficient documentation

## 2020-04-28 DIAGNOSIS — Y831 Surgical operation with implant of artificial internal device as the cause of abnormal reaction of the patient, or of later complication, without mention of misadventure at the time of the procedure: Secondary | ICD-10-CM | POA: Diagnosis present

## 2020-04-28 DIAGNOSIS — E876 Hypokalemia: Secondary | ICD-10-CM | POA: Diagnosis not present

## 2020-04-28 DIAGNOSIS — K083 Retained dental root: Secondary | ICD-10-CM

## 2020-04-28 DIAGNOSIS — K044 Acute apical periodontitis of pulpal origin: Secondary | ICD-10-CM | POA: Diagnosis present

## 2020-04-28 DIAGNOSIS — K047 Periapical abscess without sinus: Secondary | ICD-10-CM

## 2020-04-28 DIAGNOSIS — I5082 Biventricular heart failure: Secondary | ICD-10-CM | POA: Diagnosis present

## 2020-04-28 DIAGNOSIS — T827XXA Infection and inflammatory reaction due to other cardiac and vascular devices, implants and grafts, initial encounter: Principal | ICD-10-CM

## 2020-04-28 DIAGNOSIS — T829XXD Unspecified complication of cardiac and vascular prosthetic device, implant and graft, subsequent encounter: Secondary | ICD-10-CM | POA: Diagnosis not present

## 2020-04-28 DIAGNOSIS — K0510 Chronic gingivitis, plaque induced: Secondary | ICD-10-CM | POA: Diagnosis present

## 2020-04-28 DIAGNOSIS — X58XXXA Exposure to other specified factors, initial encounter: Secondary | ICD-10-CM | POA: Insufficient documentation

## 2020-04-28 DIAGNOSIS — T829XXA Unspecified complication of cardiac and vascular prosthetic device, implant and graft, initial encounter: Secondary | ICD-10-CM

## 2020-04-28 DIAGNOSIS — Z7902 Long term (current) use of antithrombotics/antiplatelets: Secondary | ICD-10-CM

## 2020-04-28 DIAGNOSIS — I420 Dilated cardiomyopathy: Secondary | ICD-10-CM | POA: Diagnosis present

## 2020-04-28 DIAGNOSIS — Z95811 Presence of heart assist device: Secondary | ICD-10-CM | POA: Insufficient documentation

## 2020-04-28 DIAGNOSIS — Z7901 Long term (current) use of anticoagulants: Secondary | ICD-10-CM | POA: Diagnosis not present

## 2020-04-28 DIAGNOSIS — K0530 Chronic periodontitis, unspecified: Secondary | ICD-10-CM | POA: Diagnosis not present

## 2020-04-28 DIAGNOSIS — B9561 Methicillin susceptible Staphylococcus aureus infection as the cause of diseases classified elsewhere: Secondary | ICD-10-CM | POA: Diagnosis present

## 2020-04-28 DIAGNOSIS — K08199 Complete loss of teeth due to other specified cause, unspecified class: Secondary | ICD-10-CM | POA: Diagnosis not present

## 2020-04-28 DIAGNOSIS — K029 Dental caries, unspecified: Secondary | ICD-10-CM | POA: Diagnosis not present

## 2020-04-28 DIAGNOSIS — Z79899 Other long term (current) drug therapy: Secondary | ICD-10-CM | POA: Diagnosis not present

## 2020-04-28 DIAGNOSIS — L0889 Other specified local infections of the skin and subcutaneous tissue: Secondary | ICD-10-CM | POA: Diagnosis not present

## 2020-04-28 DIAGNOSIS — I5022 Chronic systolic (congestive) heart failure: Secondary | ICD-10-CM | POA: Diagnosis not present

## 2020-04-28 DIAGNOSIS — D62 Acute posthemorrhagic anemia: Secondary | ICD-10-CM | POA: Diagnosis not present

## 2020-04-28 DIAGNOSIS — Z87891 Personal history of nicotine dependence: Secondary | ICD-10-CM

## 2020-04-28 DIAGNOSIS — L02818 Cutaneous abscess of other sites: Secondary | ICD-10-CM | POA: Diagnosis not present

## 2020-04-28 DIAGNOSIS — K045 Chronic apical periodontitis: Secondary | ICD-10-CM | POA: Diagnosis present

## 2020-04-28 DIAGNOSIS — Z20822 Contact with and (suspected) exposure to covid-19: Secondary | ICD-10-CM | POA: Diagnosis present

## 2020-04-28 DIAGNOSIS — Z8249 Family history of ischemic heart disease and other diseases of the circulatory system: Secondary | ICD-10-CM | POA: Diagnosis not present

## 2020-04-28 DIAGNOSIS — D649 Anemia, unspecified: Secondary | ICD-10-CM | POA: Diagnosis not present

## 2020-04-28 LAB — BASIC METABOLIC PANEL
Anion gap: 6 (ref 5–15)
BUN: 9 mg/dL (ref 6–20)
CO2: 27 mmol/L (ref 22–32)
Calcium: 9.3 mg/dL (ref 8.9–10.3)
Chloride: 100 mmol/L (ref 98–111)
Creatinine, Ser: 0.84 mg/dL (ref 0.61–1.24)
GFR, Estimated: >60 mL/min (ref >60)
Glucose, Bld: 135 mg/dL — ABNORMAL HIGH (ref 70–99)
Potassium: 3.6 mmol/L (ref 3.5–5.1)
Sodium: 133 mmol/L — ABNORMAL LOW (ref 135–145)

## 2020-04-28 LAB — LACTATE DEHYDROGENASE
LDH: 205 U/L — ABNORMAL HIGH (ref 98–192)
LDH: 181 U/L (ref 98–192)

## 2020-04-28 LAB — CBC
HCT: 23.9 % — ABNORMAL LOW (ref 39.0–52.0)
Hemoglobin: 7.7 g/dL — ABNORMAL LOW (ref 13.0–17.0)
MCH: 30.4 pg (ref 26.0–34.0)
MCHC: 32.2 g/dL (ref 30.0–36.0)
MCV: 94.5 fL (ref 80.0–100.0)
Platelets: 111 10*3/uL — ABNORMAL LOW (ref 150–400)
RBC: 2.53 MIL/uL — ABNORMAL LOW (ref 4.22–5.81)
RDW: 25.2 % — ABNORMAL HIGH (ref 11.5–15.5)
WBC: 6.5 10*3/uL (ref 4.0–10.5)
nRBC: 0.3 % — ABNORMAL HIGH (ref 0.0–0.2)

## 2020-04-28 LAB — SARS CORONAVIRUS 2 (TAT 6-24 HRS): SARS Coronavirus 2: NEGATIVE

## 2020-04-28 LAB — PROTIME-INR
INR: 2.3 — ABNORMAL HIGH (ref 0.8–1.2)
Prothrombin Time: 24.5 seconds — ABNORMAL HIGH (ref 11.4–15.2)

## 2020-04-28 LAB — HIV ANTIBODY (ROUTINE TESTING W REFLEX): HIV Screen 4th Generation wRfx: NONREACTIVE

## 2020-04-28 MED ORDER — METRONIDAZOLE 500 MG PO TABS
500.0000 mg | ORAL_TABLET | Freq: Three times a day (TID) | ORAL | Status: DC
  Administered 2020-04-28 – 2020-04-30 (×5): 500 mg via ORAL
  Filled 2020-04-28 (×5): qty 1

## 2020-04-28 MED ORDER — SODIUM CHLORIDE 0.9% FLUSH
3.0000 mL | Freq: Two times a day (BID) | INTRAVENOUS | Status: DC
  Administered 2020-04-29 – 2020-05-24 (×39): 3 mL via INTRAVENOUS

## 2020-04-28 MED ORDER — ONDANSETRON HCL 4 MG/2ML IJ SOLN: 4.0000 mg | Freq: Four times a day (QID) | INTRAMUSCULAR | Status: DC | PRN

## 2020-04-28 MED ORDER — SODIUM CHLORIDE 0.9 % IV SOLN
2.0000 g | Freq: Three times a day (TID) | INTRAVENOUS | Status: DC
  Administered 2020-04-28 – 2020-04-30 (×6): 2 g via INTRAVENOUS
  Filled 2020-04-28 (×6): qty 2

## 2020-04-28 MED ORDER — VANCOMYCIN HCL 750 MG/150ML IV SOLN
750.0000 mg | Freq: Two times a day (BID) | INTRAVENOUS | Status: DC
  Administered 2020-04-29 – 2020-04-30 (×3): 750 mg via INTRAVENOUS
  Filled 2020-04-28 (×4): qty 150

## 2020-04-28 MED ORDER — WARFARIN - PHARMACIST DOSING INPATIENT: Freq: Every day | Status: DC

## 2020-04-28 MED ORDER — PANTOPRAZOLE SODIUM 40 MG PO TBEC
40.0000 mg | DELAYED_RELEASE_TABLET | Freq: Every day | ORAL | Status: DC
  Administered 2020-04-29 – 2020-05-25 (×26): 40 mg via ORAL
  Filled 2020-04-28 (×25): qty 1

## 2020-04-28 MED ORDER — ACETAMINOPHEN 325 MG PO TABS: 650.0000 mg | ORAL_TABLET | ORAL | Status: DC | PRN

## 2020-04-28 MED ORDER — SODIUM CHLORIDE 0.9 % IV SOLN
250.0000 mL | INTRAVENOUS | Status: DC | PRN
  Administered 2020-05-24: 250 mL via INTRAVENOUS

## 2020-04-28 MED ORDER — VANCOMYCIN HCL 1000 MG/200ML IV SOLN
1000.0000 mg | Freq: Once | INTRAVENOUS | Status: AC
  Administered 2020-04-28: 1000 mg via INTRAVENOUS
  Filled 2020-04-28: qty 200

## 2020-04-28 MED ORDER — WARFARIN SODIUM 2.5 MG PO TABS
2.5000 mg | ORAL_TABLET | Freq: Once | ORAL | Status: AC
  Administered 2020-04-28: 2.5 mg via ORAL
  Filled 2020-04-28: qty 1

## 2020-04-28 NOTE — Plan of Care (Signed)
  Problem: Education: Goal: Knowledge of General Education information will improve Description: Including pain rating scale, medication(s)/side effects and non-pharmacologic comfort measures Outcome: Progressing   Problem: Health Behavior/Discharge Planning: Goal: Ability to manage health-related needs will improve Outcome: Progressing   Problem: Clinical Measurements: Goal: Respiratory complications will improve Outcome: Progressing   Problem: Activity: Goal: Risk for activity intolerance will decrease Outcome: Progressing   Problem: Nutrition: Goal: Adequate nutrition will be maintained Outcome: Progressing   Problem: Coping: Goal: Level of anxiety will decrease Outcome: Progressing   Problem: Elimination: Goal: Will not experience complications related to bowel motility Outcome: Progressing Goal: Will not experience complications related to urinary retention Outcome: Progressing   Problem: Pain Managment: Goal: General experience of comfort will improve Outcome: Progressing   Problem: Safety: Goal: Ability to remain free from injury will improve Outcome: Progressing   Problem: Skin Integrity: Goal: Risk for impaired skin integrity will decrease Outcome: Progressing   Problem: Education: Goal: Patient will understand all VAD equipment and how it functions Outcome: Progressing Goal: Patient will be able to verbalize current INR target range and antiplatelet therapy for discharge home Outcome: Progressing   Problem: Clinical Measurements: Goal: Ability to maintain clinical measurements within normal limits will improve Outcome: Not Progressing Goal: Will remain free from infection Outcome: Not Progressing Goal: Diagnostic test results will improve Outcome: Not Progressing Goal: Cardiovascular complication will be avoided Outcome: Not Progressing   Problem: Cardiac: Goal: LVAD will function as expected and patient will experience no clinical  alarms Outcome: Not Progressing
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
## 2020-04-28 NOTE — Progress Notes (Incomplete)
ANTICOAGULATION CONSULT NOTE - Initial Consult  Pharmacy Consult for warfarin Indication: LVAD  Not on File  Patient Measurements:   Heparin Dosing Weight: ***  Vital Signs: BP: 107/69 (03/25 1047) Pulse Rate: 115 (03/25 1046)  Labs: Recent Labs    04/28/20 0955  HGB 7.7*  HCT 23.9*  PLT 111*  LABPROT 24.5*  INR 2.3*  CREATININE 0.84    Estimated Creatinine Clearance: 92.4 mL/min (by C-G formula based on SCr of 0.84 mg/dL).   Medical History: Past Medical History:  Diagnosis Date  . Dilated cardiomyopathy (HCC)   . Dilated cardiomyopathy (HCC) 02/2019  . Polysubstance abuse (HCC)     Medications:  Scheduled:   Assessment: 36 yom presenting with concern for driveline infection - hx HM3 implanted on 02/26/19 - on warfarin PTA. PTA regimen is 2.5 mg daily.   INR today is therapeutic at 2.3. Hgb 7.7, plt 111. LDH 205. No s/sx of bleeding.    Goal of Therapy:  INR 2-2.5 Monitor platelets by anticoagulation protocol: Yes   Plan:  Warfarin 2.5 mg tonight Monitor daily INR, CBC, and for s/sx of bleeding   Sherron Monday, PharmD, BCCCP Clinical Pharmacist  Phone: [phone] 04/28/2020 3:43 PM  Please check AMION for all [facility] Pharmacy phone numbers After 10:00 PM, call Main Pharmacy [phone]
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
## 2020-04-28 NOTE — Progress Notes (Incomplete)
Pharmacy Antibiotic Note  Jon Martin is a 35 y.o. male admitted on (Not on file) with driveline infection.  Pharmacy has been consulted for cefepime and vancomycin dosing.  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 WBC 6.5, Scr 0.84 (CrCl 92 mL/min).   Plan: Cefepime 2g IV every 8 hours Vancomycin 1g IV once then 750 mg IV every 12 hours Monitor renal fx, cx results, clinic pic, and levels as appropriate     No data recorded.  Recent Labs  Lab 04/28/20 0955  WBC 6.5  CREATININE 0.84    Estimated Creatinine Clearance: 92.4 mL/min (by C-G formula based on SCr of 0.84 mg/dL).    Not on File  Antimicrobials this admission: Vancomycin 3/25 >>  Cefepime 3/25 >>   Dose adjustments this admission: N/A  Microbiology results: 3/25 BCx: sent 3/25 COVID PCR: sent  Thank you for allowing pharmacy to be a part of this patient's care.  Sherron Monday, PharmD, BCCCP Clinical Pharmacist  Phone: [phone] 04/28/2020 3:33 PM  Please check AMION for all [facility] Pharmacy phone numbers After 10:00 PM, call Main Pharmacy [phone]
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
## 2020-04-28 NOTE — Progress Notes (Signed)
LVAD INR 

## 2020-04-28 NOTE — Progress Notes (Signed)
Pharmacy Antibiotic Note  Jon Martin is a 35 y.o. 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  Pharmacy has been consulted for cefepime and vancomycin dosing.  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 WBC 6.5, Scr 0.84 (CrCl 92 mL/min).   Plan: Cefepime 2g IV every 8 hours Vancomycin 1g IV once then 750 mg IV every 12 hours Monitor renal fx, cx results, clinic pic, and levels as appropriate     Temp (24hrs), Avg:98.6 F (37 C), Min:98.6 F (37 C), Max:98.6 F (37 C)  Recent Labs  Lab 04/28/20 0955  WBC 6.5  CREATININE 0.84    Estimated Creatinine Clearance: 92.4 mL/min (by C-G formula based on SCr of 0.84 mg/dL).    No Known Allergies  Antimicrobials this admission: Vancomycin 3/25 >>  Cefepime 3/25 >>   Dose adjustments this admission: N/A  Microbiology results: 3/25 BCx: sent 3/25 COVID PCR: sent  Thank you for allowing pharmacy to be a part of this patient's care.  Sheppard Coil PharmD., BCPS Clinical Pharmacist 04/28/2020 5:12 PM   Please check AMION for all [facility] Pharmacy phone numbers After 10:00 PM, call Main Pharmacy [phone]

## 2020-04-28 NOTE — Progress Notes (Addendum)
ANTICOAGULATION CONSULT NOTE - Initial Consult  Pharmacy Consult for warfarin Indication: LVAD  No Known Allergies  Patient Measurements:   Vital Signs: Temp: 98.6 F (37 C) (03/25 1701) Temp Source: Oral (03/25 1701) BP: 107/69 (03/25 1047) Pulse Rate: 115 (03/25 1046)  Labs: Recent Labs    04/28/20 0955  HGB 7.7*  HCT 23.9*  PLT 111*  LABPROT 24.5*  INR 2.3*  CREATININE 0.84    Estimated Creatinine Clearance: 92.4 mL/min (by C-G formula based on SCr of 0.84 mg/dL).   Medical History: Past Medical History:  Diagnosis Date  . Dilated cardiomyopathy (HCC)   . Dilated cardiomyopathy (HCC) 02/2019  . Polysubstance abuse (HCC)     Assessment: 35 yom presenting with concern for driveline infection - hx HM3 implanted on 02/26/19 - on warfarin PTA. PTA regimen is 2.5 mg daily.   INR today is therapeutic at 2.3. Hgb is down to 7.7, plt 111. LDH 205. No s/sx of bleeding. Patient denied any blood in urine and stool, but reported his mouth has been bleeding a lot to LVAD coordinator earlier today.   Goal of Therapy:  INR 2-2.5 Monitor platelets by anticoagulation protocol: Yes   Plan:  Warfarin 2.5 mg tonight Monitor daily INR, CBC, and for s/sx of bleeding   Sheppard Coil PharmD., BCPS Clinical Pharmacist 04/28/2020 5:11 PM   Please check AMION for all [facility] Pharmacy phone numbers After 10:00 PM, call Main Pharmacy [phone]

## 2020-04-29 DIAGNOSIS — T829XXD Unspecified complication of cardiac and vascular prosthetic device, implant and graft, subsequent encounter: Secondary | ICD-10-CM

## 2020-04-29 DIAGNOSIS — I5022 Chronic systolic (congestive) heart failure: Secondary | ICD-10-CM | POA: Diagnosis not present

## 2020-04-29 DIAGNOSIS — T827XXA Infection and inflammatory reaction due to other cardiac and vascular devices, implants and grafts, initial encounter: Secondary | ICD-10-CM | POA: Diagnosis not present

## 2020-04-29 LAB — CBC
HCT: 18.8 % — ABNORMAL LOW (ref 39.0–52.0)
Hemoglobin: 6.1 g/dL — CL (ref 13.0–17.0)
MCH: 31.3 pg (ref 26.0–34.0)
MCHC: 32.4 g/dL (ref 30.0–36.0)
MCV: 96.4 fL (ref 80.0–100.0)
Platelets: 101 10*3/uL — ABNORMAL LOW (ref 150–400)
RBC: 1.95 MIL/uL — ABNORMAL LOW (ref 4.22–5.81)
RDW: 25.6 % — ABNORMAL HIGH (ref 11.5–15.5)
WBC: 6 10*3/uL (ref 4.0–10.5)
nRBC: 0 % (ref 0.0–0.2)

## 2020-04-29 LAB — BASIC METABOLIC PANEL
Anion gap: 7 (ref 5–15)
BUN: 6 mg/dL (ref 6–20)
CO2: 24 mmol/L (ref 22–32)
Calcium: 8.5 mg/dL — ABNORMAL LOW (ref 8.9–10.3)
Chloride: 102 mmol/L (ref 98–111)
Creatinine, Ser: 0.76 mg/dL (ref 0.61–1.24)
GFR, Estimated: >60 mL/min (ref >60)
Glucose, Bld: 109 mg/dL — ABNORMAL HIGH (ref 70–99)
Potassium: 3.3 mmol/L — ABNORMAL LOW (ref 3.5–5.1)
Sodium: 133 mmol/L — ABNORMAL LOW (ref 135–145)

## 2020-04-29 LAB — PREPARE RBC (CROSSMATCH)

## 2020-04-29 LAB — MRSA PCR SCREENING: MRSA by PCR: NEGATIVE

## 2020-04-29 LAB — PROTIME-INR
INR: 1.7 — ABNORMAL HIGH (ref 0.8–1.2)
Prothrombin Time: 19.3 seconds — ABNORMAL HIGH (ref 11.4–15.2)

## 2020-04-29 LAB — LACTATE DEHYDROGENASE: LDH: 171 U/L (ref 98–192)

## 2020-04-29 MED ORDER — SODIUM CHLORIDE 0.9% IV SOLUTION: Freq: Once | INTRAVENOUS | Status: AC

## 2020-04-29 MED ORDER — WARFARIN SODIUM 2.5 MG PO TABS
2.5000 mg | ORAL_TABLET | Freq: Once | ORAL | Status: AC
  Administered 2020-04-29: 2.5 mg via ORAL

## 2020-04-29 MED ORDER — POTASSIUM CHLORIDE CRYS ER 20 MEQ PO TBCR
30.0000 meq | EXTENDED_RELEASE_TABLET | Freq: Four times a day (QID) | ORAL | Status: AC
  Administered 2020-04-29 (×2): 30 meq via ORAL
  Filled 2020-04-29 (×2): qty 1

## 2020-04-29 MED ORDER — SODIUM CHLORIDE 0.9 % IV SOLN: INTRAVENOUS | Status: DC

## 2020-04-29 MED ORDER — WARFARIN SODIUM 4 MG PO TABS: 4.0000 mg | ORAL_TABLET | Freq: Once | ORAL | Status: DC

## 2020-04-29 NOTE — Consult Note (Addendum)
UNASSIGNED CONSULT  Reason for Consult: Anemia Referring Physician: Marca Ancona, M.D.  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  His brother accidentally pulled on the drive line and he developed a subsequent infection.  ID evaluated the patient and he is currently being treated with vancomycin, cefepime, and metronidazole.  Over the course of his hospitalization his HGB was noted to drop down to 6.1 g/dL from his baseline of 8-9 g/dL.  There were no reports of any hematochezia or melena.  Prior to his LVAD he did have an anemia and it ranged from 10-12 g/dL.  Past Medical History:  Diagnosis Date  . Dilated cardiomyopathy (HCC)   . Dilated cardiomyopathy (HCC) 02/2019  . Polysubstance abuse [facility])     Past Surgical History:  Procedure Laterality Date  . INSERTION OF IMPLANTABLE LEFT VENTRICULAR ASSIST DEVICE N/A 02/26/2019   Procedure: INSERTION OF IMPLANTABLE LEFT VENTRICULAR ASSIST DEVICE;  Surgeon: Kerin Perna, MD;  Location: [facility] OR;  Service: Open Heart Surgery;  Laterality: N/A;  HM3  . IR THORACENTESIS ASP PLEURAL SPACE W/IMG GUIDE  03/10/2019  . RIGHT HEART CATH N/A 02/25/2019   Procedure: RIGHT HEART CATH;  Surgeon: Dolores Patty, MD;  Location: [facility] INVASIVE CV LAB;  Service: Cardiovascular;  Laterality: N/A;  . RIGHT/LEFT HEART CATH AND CORONARY ANGIOGRAPHY N/A 02/11/2019   Procedure: RIGHT/LEFT HEART CATH AND CORONARY ANGIOGRAPHY;  Surgeon: Dolores Patty, MD;  Location: [facility] INVASIVE CV LAB;  Service: Cardiovascular;  Laterality: N/A;  . TEE WITHOUT CARDIOVERSION N/A 02/26/2019   Procedure: TRANSESOPHAGEAL ECHOCARDIOGRAM (TEE);  Surgeon: Donata Clay, Theron Arista, MD;  Location: [facility] OR;  Service: Open Heart Surgery;  Laterality: N/A;  . VIDEO BRONCHOSCOPY N/A 02/26/2019   Procedure: Video Bronchoscopy;  Surgeon: Kerin Perna, MD;  Location: [facility] OR;  Service: Open Heart Surgery;   Laterality: N/A;    Family History  Problem Relation Age of Onset  . CAD Father     Social History:  reports that he has quit smoking. His smoking use included cigarettes. He has never used smokeless tobacco. He reports current alcohol use. He reports previous drug use.  Allergies: No Known Allergies  Medications:  Scheduled: . metroNIDAZOLE  500 mg Oral Q8H  . pantoprazole  40 mg Oral Daily  . potassium chloride  30 mEq Oral Q6H  . sodium chloride flush  3 mL Intravenous Q12H  . warfarin  4 mg Oral ONCE-1600  . Warfarin - Pharmacist Dosing Inpatient   Does not apply q1600   Continuous: . sodium chloride    . ceFEPime (MAXIPIME) IV 2 g (04/29/20 0512)  . vancomycin 750 mg (04/29/20 1128)    Results for orders placed or performed during the hospital encounter of 04/28/20 (from the past 24 hour(s))  SARS CORONAVIRUS 2 (TAT 6-24 HRS) Nasopharyngeal     Status: None   Collection Time: 04/28/20  3:26 PM   Specimen: Nasopharyngeal  Result Value Ref Range   SARS Coronavirus 2 NEGATIVE NEGATIVE  HIV Antibody (routine testing w rflx)     Status: None   Collection Time: 04/28/20  5:38 PM  Result Value Ref Range   HIV Screen 4th Generation wRfx Non Reactive Non Reactive  Lactate dehydrogenase     Status: None   Collection Time: 04/28/20  5:38 PM  Result Value Ref Range   LDH 181 98 - 192 U/L  MRSA  PCR Screening     Status: None   Collection Time: 04/28/20 10:59 PM   Specimen: Nasopharyngeal  Result Value Ref Range   MRSA by PCR NEGATIVE NEGATIVE  Protime-INR     Status: Abnormal   Collection Time: 04/29/20 12:42 AM  Result Value Ref Range   Prothrombin Time 19.3 (H) 11.4 - 15.2 seconds   INR 1.7 (H) 0.8 - 1.2  Basic metabolic panel     Status: Abnormal   Collection Time: 04/29/20 12:42 AM  Result Value Ref Range   Sodium 133 (L) 135 - 145 mmol/L   Potassium 3.3 (L) 3.5 - 5.1 mmol/L   Chloride 102 98 - 111 mmol/L   CO2 24 22 - 32 mmol/L   Glucose, Bld 109 (H) 70 - 99  mg/dL   BUN 6 6 - 20 mg/dL   Creatinine, Ser 2.50 0.61 - 1.24 mg/dL   Calcium 8.5 (L) 8.9 - 10.3 mg/dL   GFR, Estimated >03 >70 mL/min   Anion gap 7 5 - 15  Lactate dehydrogenase     Status: None   Collection Time: 04/29/20 12:42 AM  Result Value Ref Range   LDH 171 98 - 192 U/L  CBC     Status: Abnormal   Collection Time: 04/29/20 12:42 AM  Result Value Ref Range   WBC 6.0 4.0 - 10.5 K/uL   RBC 1.95 (L) 4.22 - 5.81 MIL/uL   Hemoglobin 6.1 (LL) 13.0 - 17.0 g/dL   HCT 48.8 (L) 89.1 - 69.4 %   MCV 96.4 80.0 - 100.0 fL   MCH 31.3 26.0 - 34.0 pg   MCHC 32.4 30.0 - 36.0 g/dL   RDW 50.3 (H) 88.8 - 28.0 %   Platelets 101 (L) 150 - 400 K/uL   nRBC 0.0 0.0 - 0.2 %  Prepare RBC (crossmatch)     Status: None   Collection Time: 04/29/20  3:50 AM  Result Value Ref Range   Order Confirmation      ORDER PROCESSED BY BLOOD BANK Performed at [facility], [address], [place], [place] [address]   Type and screen [facility]     Status: None (Preliminary result)   Collection Time: 04/29/20  3:51 AM  Result Value Ref Range   ABO/RH(D) O POS    Antibody Screen NEG    Sample Expiration 05/02/2020,2359    Unit Number [id number]    Blood Component Type RBC LR PHER2    Unit division 00    Status of Unit ISSUED    Transfusion Status OK TO TRANSFUSE    Crossmatch Result      Compatible Performed at [facility], [address], [place], [place] [address]    Unit Number [id number]    Blood Component Type RED CELLS,LR    Unit division 00    Status of Unit ISSUED    Transfusion Status OK TO TRANSFUSE    Crossmatch Result Compatible      No results found.  ROS:  As stated above in the HPI otherwise negative.  Blood pressure 97/82, pulse 91, temperature 98.4 F (36.9 C), temperature source Oral, resp. rate 16, weight 55.7 kg, SpO2 98 %.    PE: Gen: NAD, Alert and Oriented HEENT:  [place]/AT, EOMI Neck: Supple, no LAD Lungs: CTA Bilaterally CV: LVAD  flow ABD: Soft, NTND, +BS Ext: No C/C/E  Assessment/Plan: 1) Anemia. 2) NICM s/p LVAD. 3) Drive line infection.   With his acute on  chronic anemia, and having an LVAD placed, an enteroscopy will be performed to check for any AVMs.  Plan: 1) Enteroscopy tomorrow with possible APC.  HUNG,PATRICK D 04/29/2020, 12:19 PM

## 2020-04-29 NOTE — Plan of Care (Signed)
  Problem: Education: Goal: Knowledge of General Education information will improve Description: Including pain rating scale, medication(s)/side effects and non-pharmacologic comfort measures Outcome: Progressing   Problem: Health Behavior/Discharge Planning: Goal: Ability to manage health-related needs will improve Outcome: Progressing   Problem: Clinical Measurements: Goal: Ability to maintain clinical measurements within normal limits will improve Outcome: Progressing   Problem: Clinical Measurements: Goal: Will remain free from infection Outcome: Progressing   Problem: Clinical Measurements: Goal: Diagnostic test results will improve Outcome: Progressing   Problem: Activity: Goal: Risk for activity intolerance will decrease Outcome: Progressing   Problem: Nutrition: Goal: Adequate nutrition will be maintained Outcome: Progressing   Problem: Pain Managment: Goal: General experience of comfort will improve Outcome: Progressing   Problem: Skin Integrity: Goal: Risk for impaired skin integrity will decrease Outcome: Progressing   Problem: Education: Goal: Patient will understand all VAD equipment and how it functions Outcome: Progressing   Problem: Cardiac: Goal: LVAD will function as expected and patient will experience no clinical alarms Outcome: Progressing

## 2020-04-29 NOTE — Progress Notes (Signed)
Date and time results received: 04/29/20 at 0240   Test: Hemoglobin Critical Value: 6.1  Name of Provider Notified: Dr Mackie Pai  Orders Received? Or Actions Taken?: 2 units of PRBCs ordered.

## 2020-04-29 NOTE — Progress Notes (Signed)
Patient ID: Jon Martin, male   DOB: 1985-04-24, 35 y.o.   MRN: [id number]   Advanced Heart Failure VAD Team Note  PCP-Cardiologist: No primary care provider on file.   Subjective:    Afebrile, WBCs normal.  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 Cultures pending.   Hgb down 7.7 => 6.1.  No overt bleeding, had normal BM last night.  LDH normal 171.    LVAD INTERROGATION:  HeartMate 3 LVAD:   Flow 3.8 liters/min, speed 5400, power 3.8, PI 4.1.  15 PI events in last 24/hrs.    Objective:    Vital Signs:   Temp:  [98.1 F (36.7 C)-98.9 F (37.2 C)] 98.1 F (36.7 C) (03/26 0930) Pulse Rate:  [86-106] 86 (03/26 0930) Resp:  [16-19] 17 (03/26 0930) BP: (84-129)/(58-109) 101/67 (03/26 0930) SpO2:  [97 %-100 %] 100 % (03/26 0930) Weight:  [51.8 kg-55.7 kg] 55.7 kg (03/26 0300) Last BM Date: 04/28/20 Mean arterial Pressure 70s  Intake/Output:   Intake/Output Summary (Last 24 hours) at 04/29/2020 1038 Last data filed at 04/29/2020 0830 Gross per 24 hour  Intake 1288.39 ml  Output 1725 ml  Net -436.61 ml     Physical Exam    General:  Well appearing. No resp difficulty HEENT: normal Neck: supple. JVP . Carotids 2+ bilat; no bruits. No lymphadenopathy or thyromegaly appreciated. Cor: Mechanical heart sounds with LVAD hum present. Lungs: clear Abdomen: soft, nontender, nondistended. No hepatosplenomegaly. No bruits or masses. Good bowel sounds. Driveline: C/D/I; securement device intact and driveline incorporated Extremities: no cyanosis, clubbing, rash, edema Neuro: alert & orientedx3, cranial nerves grossly intact. moves all 4 extremities w/o difficulty. Affect pleasant   Telemetry   NSR (personally reviewed)  Labs   Basic Metabolic Panel: Recent Labs  Lab 04/28/20 0955 04/29/20 0042  NA 133* 133*  K 3.6 3.3*  CL 100 102  CO2 27 24  GLUCOSE 135* 109*  BUN 9 6  CREATININE 0.84 0.76  CALCIUM 9.3 8.5*    Liver Function  Tests: No results for input(s): AST, ALT, ALKPHOS, BILITOT, PROT, ALBUMIN in the last 168 hours. No results for input(s): LIPASE, AMYLASE in the last 168 hours. No results for input(s): AMMONIA in the last 168 hours.  CBC: Recent Labs  Lab 04/28/20 0955 04/29/20 0042  WBC 6.5 6.0  HGB 7.7* 6.1*  HCT 23.9* 18.8*  MCV 94.5 96.4  PLT 111* 101*    INR: Recent Labs  Lab 04/28/20 0955 04/29/20 0042  INR 2.3* 1.7*    Other results:  EKG:    Imaging    No results found.   Medications:     Scheduled Medications: . metroNIDAZOLE  500 mg Oral Q8H  . pantoprazole  40 mg Oral Daily  . potassium chloride  30 mEq Oral Q6H  . sodium chloride flush  3 mL Intravenous Q12H  . warfarin  4 mg Oral ONCE-1600  . Warfarin - Pharmacist Dosing Inpatient   Does not apply q1600     Infusions: . sodium chloride    . ceFEPime (MAXIPIME) IV 2 g (04/29/20 0512)  . vancomycin       PRN Medications:  sodium chloride, acetaminophen, ondansetron (ZOFRAN) IV   Patient Profile     Assessment/Plan:    1. 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 - Also concern for odontogenic infection, will need to see dentist (Monday) - Pending culture data.  admitted for IV abx => currently on vancomycin/cefepime/Flagyl  per ID. - If not responding well will need CT imaging   2.  Chronic Systolic HF due to NICM - Echo EF 10% with biventricular failure - HM-3 VAD implant 02/26/19 - Echo 7/21 EF 40-45% - No bb or ARB with previous dehydration/RV failure.  - Hold sildenafil  - VAD interrogated personally. Parameters stable with frequent PI events  3. Anemia - Baseline hgb has been upper 9's, admission hgb 7.7 and down to 6.1 today.  Cause of drop uncertain, no overt GI bleeding (normal BM last night).  LDH not elevated.  To some degree may be dilutional with 1 L IVF yesterday but suspect some underlying process.  - Will ask GI to see, ?EGD.  Continue Protonix.  INR  1.7 today, will not start heparin gtt.  Warfarin currently ordered but can hold if GI wants to scope and wants held.  - Give 2 units PRBCs.   4. F/E/N - I continue to worry about his nutritional status after his mother died.   I reviewed the LVAD parameters from today, and compared the results to the patient's prior recorded data.  No programming changes were made.  The LVAD is functioning within specified parameters.  The patient performs LVAD self-test daily.  LVAD interrogation was negative for any significant power changes, alarms or PI events/speed drops.  LVAD equipment check completed and is in good working order.  Back-up equipment present.   LVAD education done on emergency procedures and precautions and reviewed exit site care.  Length of Stay: 1  Marca Ancona, MD 04/29/2020, 10:38 AM  VAD Team --- VAD ISSUES ONLY--- Pager [phone] (7am - 7am)  Advanced Heart Failure Team  Pager [phone] (M-F; 7a - 5p)  Please contact [facility] for night-coverage after hours (5p -7a ) and weekends on [email or web address]

## 2020-04-29 NOTE — Plan of Care (Signed)

## 2020-04-29 NOTE — Progress Notes (Addendum)
ANTICOAGULATION CONSULT NOTE - Initial Consult  Pharmacy Consult for warfarin Indication: LVAD  No Known Allergies  Patient Measurements: Weight: 55.7 kg (122 lb 12.7 oz) Vital Signs: Temp: 98.5 F (36.9 C) (03/26 0615) Temp Source: Oral (03/26 0615) BP: 91/69 (03/26 0615) Pulse Rate: 90 (03/26 0615)  Labs: Recent Labs    04/28/20 0955 04/29/20 0042  HGB 7.7* 6.1*  HCT 23.9* 18.8*  PLT 111* 101*  LABPROT 24.5* 19.3*  INR 2.3* 1.7*  CREATININE 0.84 0.76    Estimated Creatinine Clearance: 102.5 mL/min (by C-G formula based on SCr of 0.76 mg/dL).   Medical History: Past Medical History:  Diagnosis Date  . Dilated cardiomyopathy (HCC)   . Dilated cardiomyopathy (HCC) 02/2019  . Polysubstance abuse (HCC)     Assessment: 26 yom presenting with concern for driveline infection - hx HM3 implanted on 02/26/19 - on warfarin PTA. PTA regimen is 2.5 mg daily.   Patient denied any blood in urine and stool, but reported his mouth has been bleeding a lot to LVAD coordinator earlier today.   INR down to 1.7 today, and Hgb down to 6.6.  Spoke to Dr. Shirlee Latch, will not initiate heparin bridge for now.  Plan for enteroscopy tomorrow with GI  Goal of Therapy:  INR 2-2.5 Monitor platelets by anticoagulation protocol: Yes   Plan:  Given GI plans for tomorrow and new flagyl - don't want to overshoot INR.  Planning to target INR close to 2 for now per Dr. Shirlee Latch Coumadin 2.5 mg po x 1 tonight.  Reece Leader, Colon Flattery, BCCP Clinical Pharmacist  04/29/2020 3:05 PM   [facility] pharmacy phone numbers are listed on [email or web address]

## 2020-04-30 ENCOUNTER — Encounter (HOSPITAL_COMMUNITY): Payer: Self-pay | Admitting: Internal Medicine

## 2020-04-30 ENCOUNTER — Encounter (HOSPITAL_COMMUNITY): Payer: Self-pay | Admitting: Certified Registered"

## 2020-04-30 ENCOUNTER — Encounter (HOSPITAL_COMMUNITY)
Admission: AD | Disposition: A | Discharge status: Skilled Nursing Facility | Payer: Self-pay | Source: Ambulatory Visit | Attending: Internal Medicine

## 2020-04-30 DIAGNOSIS — T829XXD Unspecified complication of cardiac and vascular prosthetic device, implant and graft, subsequent encounter: Secondary | ICD-10-CM | POA: Diagnosis not present

## 2020-04-30 DIAGNOSIS — T827XXA Infection and inflammatory reaction due to other cardiac and vascular devices, implants and grafts, initial encounter: Secondary | ICD-10-CM | POA: Diagnosis not present

## 2020-04-30 LAB — CBC
HCT: 31.5 % — ABNORMAL LOW (ref 39.0–52.0)
Hemoglobin: 10.5 g/dL — ABNORMAL LOW (ref 13.0–17.0)
MCH: 31.7 pg (ref 26.0–34.0)
MCHC: 33.3 g/dL (ref 30.0–36.0)
MCV: 95.2 fL (ref 80.0–100.0)
Platelets: 126 10*3/uL — ABNORMAL LOW (ref 150–400)
RBC: 3.31 MIL/uL — ABNORMAL LOW (ref 4.22–5.81)
RDW: 21.6 % — ABNORMAL HIGH (ref 11.5–15.5)
WBC: 6.2 10*3/uL (ref 4.0–10.5)
nRBC: 0 % (ref 0.0–0.2)

## 2020-04-30 LAB — BASIC METABOLIC PANEL
Anion gap: 7 (ref 5–15)
BUN: 5 mg/dL — ABNORMAL LOW (ref 6–20)
CO2: 24 mmol/L (ref 22–32)
Calcium: 9.3 mg/dL (ref 8.9–10.3)
Chloride: 104 mmol/L (ref 98–111)
Creatinine, Ser: 0.74 mg/dL (ref 0.61–1.24)
GFR, Estimated: >60 mL/min (ref >60)
Glucose, Bld: 105 mg/dL — ABNORMAL HIGH (ref 70–99)
Potassium: 4 mmol/L (ref 3.5–5.1)
Sodium: 135 mmol/L (ref 135–145)

## 2020-04-30 LAB — BPAM RBC
Blood Product Expiration Date: 202204202359
Blood Product Expiration Date: 202204212359
ISSUE DATE / TIME: 202203260553
ISSUE DATE / TIME: 202203260907
Unit Type and Rh: 5100
Unit Type and Rh: 5100

## 2020-04-30 LAB — TYPE AND SCREEN
ABO/RH(D): O POS
Antibody Screen: NEGATIVE
Unit division: 0
Unit division: 0

## 2020-04-30 LAB — LACTATE DEHYDROGENASE: LDH: 243 U/L — ABNORMAL HIGH (ref 98–192)

## 2020-04-30 LAB — AEROBIC CULTURE W GRAM STAIN (SUPERFICIAL SPECIMEN)

## 2020-04-30 LAB — PROTIME-INR
INR: 1.5 — ABNORMAL HIGH (ref 0.8–1.2)
Prothrombin Time: 17.2 seconds — ABNORMAL HIGH (ref 11.4–15.2)

## 2020-04-30 SURGERY — CANCELLED PROCEDURE

## 2020-04-30 MED ORDER — CEFAZOLIN SODIUM-DEXTROSE 2-4 GM/100ML-% IV SOLN
2.0000 g | Freq: Three times a day (TID) | INTRAVENOUS | Status: AC
  Administered 2020-04-30 – 2020-05-05 (×16): 2 g via INTRAVENOUS
  Filled 2020-04-30 (×16): qty 100

## 2020-04-30 MED ORDER — SODIUM CHLORIDE 0.9 % IV BOLUS
500.0000 mL | Freq: Once | INTRAVENOUS | Status: AC
  Administered 2020-04-30: 500 mL via INTRAVENOUS

## 2020-04-30 MED ORDER — WARFARIN SODIUM 5 MG PO TABS
5.0000 mg | ORAL_TABLET | Freq: Once | ORAL | Status: AC
  Administered 2020-04-30: 5 mg via ORAL
  Filled 2020-04-30: qty 1

## 2020-04-30 NOTE — Progress Notes (Signed)
Subjective: No complaints.  Feeling well.  Objective: Vital signs in last 24 hours: Temp:  [98 F (36.7 C)-98.7 F (37.1 C)] 98.5 F (36.9 C) (03/27 0710) Pulse Rate:  [76-96] 85 (03/27 0710) Resp:  [16-19] 19 (03/27 0710) BP: (90-104)/(64-82) 90/64 (03/27 0710) SpO2:  [98 %-100 %] 99 % (03/27 0710) Weight:  [55.8 kg] 55.8 kg (03/27 0300) Last BM Date: 04/29/20  Intake/Output from previous day: 03/26 0701 - 03/27 0700 In: 2319.3 [P.O.:838; I.V.:251.1; Blood:630; IV Piggyback:600.3] Out: 2575 [Urine:2575] Intake/Output this shift: Total I/O In: -  Out: 300 [Urine:300]  General appearance: alert and no distress Resp: clear to auscultation bilaterally Cardio: LVAD flow GI: soft, non-tender; bowel sounds normal; no masses,  no organomegaly Extremities: extremities normal, atraumatic, no cyanosis or edema  Lab Results: Recent Labs    04/28/20 0955 04/29/20 0042 04/30/20 0308  WBC 6.5 6.0 6.2  HGB 7.7* 6.1* 10.5*  HCT 23.9* 18.8* 31.5*  PLT 111* 101* 126*   BMET Recent Labs    04/28/20 0955 04/29/20 0042 04/30/20 0308  NA 133* 133* 135  K 3.6 3.3* 4.0  CL 100 102 104  CO2 27 24 24   GLUCOSE 135* 109* 105*  BUN 9 6 5*  CREATININE 0.84 0.76 0.74  CALCIUM 9.3 8.5* 9.3   LFT No results for input(s): PROT, ALBUMIN, AST, ALT, ALKPHOS, BILITOT, BILIDIR, IBILI in the last 72 hours. PT/INR Recent Labs    04/29/20 0042 04/30/20 0308  LABPROT 19.3* 17.2*  INR 1.7* 1.5*   Hepatitis Panel No results for input(s): HEPBSAG, HCVAB, HEPAIGM, HEPBIGM in the last 72 hours. C-Diff No results for input(s): CDIFFTOX in the last 72 hours. Fecal Lactopherrin No results for input(s): FECLLACTOFRN in the last 72 hours.  Studies/Results: No results found.  Medications:  Scheduled: . metroNIDAZOLE  500 mg Oral Q8H  . pantoprazole  40 mg Oral Daily  . sodium chloride flush  3 mL Intravenous Q12H  . warfarin  5 mg Oral ONCE-1600  . Warfarin - Pharmacist Dosing Inpatient    Does not apply q1600   Continuous: . sodium chloride    . sodium chloride 20 mL/hr at 04/29/20 1646  . ceFEPime (MAXIPIME) IV 2 g (04/30/20 0549)  . vancomycin 750 mg (04/30/20 0759)    Assessment/Plan: 1) Anemia. 2) NICM s/p HM-3 LVAD.   The patient's HGB markedly increased after two units of PRBC.  There was no overt evidence of bleeding.  The patient is clinically stable.  It is not clear if the HGB from yesterday was erroneous.  Since there is no imminent need for any emergent GI intervention, with his poor cardiac status, the enteroscopy was cancelled.  Plan: 1) Monitor HGB. 2) The patient may not need an enteroscopy.  LOS: 2 days   Jon Martin,Jon Martin 04/30/2020, 10:57 AM

## 2020-04-30 NOTE — Progress Notes (Signed)
Patient ID: Jon Martin, male   DOB: 04-04-1985, 35 y.o.   MRN: [id number]   Advanced Heart Failure VAD Team Note  PCP-Cardiologist: No primary care provider on file.   Subjective:    Afebrile, WBCs normal.  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   Hgb 7.7 => 6.1 => 10.5 after 2 units PRBCs.  No overt bleeding, had normal BM last night that was heme negative.  LDH 243.  He feels good, no complaints.  MAP 70s-80s. INR 1.5.     LVAD INTERROGATION:  HeartMate 3 LVAD:   Flow 3.5 liters/min, speed 5400, power 4, PI 7.     Objective:    Vital Signs:   Temp:  [98 F (36.7 C)-98.7 F (37.1 C)] 98.5 F (36.9 C) (03/27 0710) Pulse Rate:  [76-96] 85 (03/27 0710) Resp:  [16-20] 19 (03/27 0710) BP: (90-104)/(64-82) 90/64 (03/27 0710) SpO2:  [98 %-100 %] 99 % (03/27 0710) Weight:  [55.8 kg] 55.8 kg (03/27 0300) Last BM Date: 04/29/20 Mean arterial Pressure 70s-80s  Intake/Output:   Intake/Output Summary (Last 24 hours) at 04/30/2020 0845 Last data filed at 04/30/2020 0800 Gross per 24 hour  Intake 1646.33 ml  Output 2500 ml  Net -853.67 ml     Physical Exam    General: Well appearing this am. NAD.  HEENT: Normal. Neck: Supple, JVP 7-8 cm. Carotids OK.  Cardiac:  Mechanical heart sounds with LVAD hum present.  Lungs:  CTAB, normal effort.  Abdomen:  NT, ND, no HSM. No bruits or masses. +BS  LVAD exit site: Well-healed and incorporated. Dressing dry and intact. No erythema or drainage. Stabilization device present and accurately applied. Driveline dressing changed daily per sterile technique. Extremities:  Warm and dry. No cyanosis, clubbing, rash, or edema.  Neuro:  Alert & oriented x 3. Cranial nerves grossly intact. Moves all 4 extremities w/o difficulty. Affect pleasant     Telemetry   NSR (personally reviewed)  Labs   Basic Metabolic Panel: Recent Labs  Lab 04/28/20 0955  04/29/20 0042 04/30/20 0308  NA 133* 133* 135  K 3.6 3.3* 4.0  CL 100 102 104  CO2 27 24 24   GLUCOSE 135* 109* 105*  BUN 9 6 5*  CREATININE 0.84 0.76 0.74  CALCIUM 9.3 8.5* 9.3    Liver Function Tests: No results for input(s): AST, ALT, ALKPHOS, BILITOT, PROT, ALBUMIN in the last 168 hours. No results for input(s): LIPASE, AMYLASE in the last 168 hours. No results for input(s): AMMONIA in the last 168 hours.  CBC: Recent Labs  Lab 04/28/20 0955 04/29/20 0042 04/30/20 0308  WBC 6.5 6.0 6.2  HGB 7.7* 6.1* 10.5*  HCT 23.9* 18.8* 31.5*  MCV 94.5 96.4 95.2  PLT 111* 101* 126*    INR: Recent Labs  Lab 04/28/20 0955 04/29/20 0042 04/30/20 0308  INR 2.3* 1.7* 1.5*    Other results:  EKG:    Imaging   No results found.   Medications:     Scheduled Medications: . metroNIDAZOLE  500 mg Oral Q8H  . pantoprazole  40 mg Oral Daily  . sodium chloride flush  3 mL Intravenous Q12H  . Warfarin - Pharmacist Dosing Inpatient   Does not apply q1600    Infusions: . sodium chloride    . sodium chloride 20 mL/hr at 04/29/20 1646  . ceFEPime (MAXIPIME) IV 2 g (04/30/20 0549)  . vancomycin 750 mg (04/30/20 0759)    PRN  Medications: sodium chloride, acetaminophen, ondansetron (ZOFRAN) IV   Patient Profile     Assessment/Plan:    1. 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 - Also concern for odontogenic infection, will need to see dentist (Monday) - Admitted for IV abx => currently on vancomycin/cefepime/Flagyl per ID. Driveline culture with S aureus, pending sensitivities. - If not responding well will need CT imaging   2.  Chronic Systolic HF due to NICM - Echo EF 10% with biventricular failure - HM-3 VAD implant 02/26/19 - Echo 7/21 EF 40-45% - No bb or ARB with previous dehydration/RV failure.  - VAD interrogated personally. Parameters stable.   3. Anemia - Baseline hgb has been upper 9's, admission hgb 7.7 => 6.1.   However, hgb up to 10.5 after 2 units PRCs and stool was heme negative.  Does not appear to be actively GI bleeding, ?initial drop dilutional with IVF at admission.  Discussed with GI, will hold off enteroscopy and follow hgb.  - Continue warfarin for INR 2-2.5 for now.   4. F/E/N - We continue to worry about his nutritional status after his mother died.   I reviewed the LVAD parameters from today, and compared the results to the patient's prior recorded data.  No programming changes were made.  The LVAD is functioning within specified parameters.  The patient performs LVAD self-test daily.  LVAD interrogation was negative for any significant power changes, alarms or PI events/speed drops.  LVAD equipment check completed and is in good working order.  Back-up equipment present.   LVAD education done on emergency procedures and precautions and reviewed exit site care.  Length of Stay: 2  Marca Ancona, MD 04/30/2020, 8:45 AM  VAD Team --- VAD ISSUES ONLY--- Pager [phone] (7am - 7am)  Advanced Heart Failure Team  Pager [phone] (M-F; 7a - 5p)  Please contact [facility] for night-coverage after hours (5p -7a ) and weekends on [email or web address]

## 2020-04-30 NOTE — Progress Notes (Signed)
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   HM III LVAD implanted on 02/26/19 by Dr. Maren Beach under Destination Therapy criteria.  Patient sitting up in bed this am, remains NPO for scheduled endoscopy this am. Waiting on IV team to start second peripheral as requested by endo lab.   Per Ben (BS nurse) Hgb resulted this am 10.5 after getting two unit PCs yesterday for Hgb 6.1. Patient has had no bloody stools or emesis overnight or this am. Romeo Apple reports he had negative stool for occult blood yesterday. Dr. Shirlee Latch and Dr. Elnoria Howard updated, will cancel procedure for this am and continue to monitor.  Vital signs: Temp: 98.5 HR: 85 Doppler Pressure: 84 Automatic BP:  90/64 (72) O2 Sat:  99% RA Wt: 122.8>123 lbs   LVAD interrogation reveals:  Speed: 5400 Flow: 3.4 Power:  3.9w PI: 6.7  Hct: 20 - do not change  Alarms: none Events:  10 PI events today  Fixed speed: 5400 Low speed limit: 5100  Drive Line:  Existing VAD dressing removed and site care performed using sterile technique. Drive line exit site cleaned with Chlora prep applicators x 2, allowed to dry.  Cleansed with sterile saline wipe x 2, allowed to dry and gauze dressing with silver strip re-applied. Silk tape used to adhere dressing. Exit site incorporated.The velour is fully implanted at exit site. Small amount of thick, tan drainage noted on dressing with no foul odor. No redness or tenderness noted. Drive line anchor replaced. Continue daily dressing changes per BS nurse. Next dressing due 05/01/20.  Labs:  LDH trend: 181>171  INR trend: 2.3>1.7  Anticoagulation Plan: -INR Goal: 2.0 - 2.5 -ASA Dose: none  Blood Products:  - 04/29/20>>2 units PCs  Device: N/A  Infection:  - 04/28/20 BCs>>pending - 04/28/20 DL culture>>rare staph aureus; final pending  Plan/Recommendations:  1. Call VAD Coordinator if any VAD equipment or drive line issues. 2.  Daily dressing changes per BS nurse.  [name] RN, VAD Coordinator 24/7 VAD pager: [phone]

## 2020-04-30 NOTE — Progress Notes (Addendum)
   04/30/20 2015  HeartMate 3 VAD Equipment Check  Pump Speed (RPM) 5400 RPM  Pump Flow (LPM) 2.7  Power (Watts) 4 Watts  Pulsatility Index 9.8  Fixed Speed Limit 5400 rpm  Low Speed Limit 5100 rpm  Alarms Other (Comment) (Low Flow alarm)  Auscultated Normal expected humming   Patient had low flow alarm. Pt had just returned from using the restroom. Pt was asymptomatic. LVAD Coordinator [place] paged. Received orders for NS bolus and have patient position himself differently. BP was 108/97 with MAP of 102. Doppler of 88. All other VSS. Will continue to monitor.

## 2020-04-30 NOTE — Progress Notes (Signed)
ANTICOAGULATION CONSULT NOTE  Pharmacy Consult for warfarin Indication: LVAD  No Known Allergies  Patient Measurements: Weight: 55.8 kg (123 lb 0.3 oz) Vital Signs: Temp: 98.5 F (36.9 C) (03/27 0710) Temp Source: Oral (03/27 0710) BP: 90/64 (03/27 0710) Pulse Rate: 85 (03/27 0710)  Labs: Recent Labs    04/28/20 0955 04/29/20 0042 04/30/20 0308  HGB 7.7* 6.1* 10.5*  HCT 23.9* 18.8* 31.5*  PLT 111* 101* 126*  LABPROT 24.5* 19.3* 17.2*  INR 2.3* 1.7* 1.5*  CREATININE 0.84 0.76 0.74    Estimated Creatinine Clearance: 102.7 mL/min (by C-G formula based on SCr of 0.74 mg/dL).   Medical History: Past Medical History:  Diagnosis Date  . Dilated cardiomyopathy (HCC)   . Dilated cardiomyopathy (HCC) 02/2019  . Polysubstance abuse (HCC)     Assessment: 88 yom presenting with concern for driveline infection - hx HM3 implanted on 02/26/19 - on warfarin PTA. PTA regimen is 2.5 mg daily.   Patient denied any blood in urine and stool, but reported his mouth has been bleeding a lot to LVAD coordinator earlier today.   INR down to 1.5 today, and Hgb up to 10.5 s/p PRBCs.  Spoke to Dr. Shirlee Latch, will not initiate heparin bridge for now.  Holding off on enteroscopy.   Goal of Therapy:  INR 2-2.5 Monitor platelets by anticoagulation protocol: Yes   Plan:  Coumadin 5 mg po x 1 tonight. Daily INR. F/u plans for further GI workup.  Reece Leader, Colon Flattery, [facility] Clinical Pharmacist  04/30/2020 9:14 AM   [facility] pharmacy phone numbers are listed on [email or web address]

## 2020-04-30 NOTE — Plan of Care (Signed)
  Problem: Education: Goal: Knowledge of General Education information will improve Description: Including pain rating scale, medication(s)/side effects and non-pharmacologic comfort measures Outcome: Progressing   Problem: Health Behavior/Discharge Planning: Goal: Ability to manage health-related needs will improve Outcome: Progressing   Problem: Clinical Measurements: Goal: Ability to maintain clinical measurements within normal limits will improve Outcome: Progressing   Problem: Clinical Measurements: Goal: Will remain free from infection Outcome: Progressing   Problem: Clinical Measurements: Goal: Diagnostic test results will improve Outcome: Progressing   Problem: Clinical Measurements: Goal: Cardiovascular complication will be avoided Outcome: Progressing   Problem: Nutrition: Goal: Adequate nutrition will be maintained Outcome: Progressing   Problem: Pain Managment: Goal: General experience of comfort will improve Outcome: Progressing   Problem: Education: Goal: Patient will understand all VAD equipment and how it functions Outcome: Progressing   Problem: Cardiac: Goal: LVAD will function as expected and patient will experience no clinical alarms Outcome: Progressing

## 2020-04-30 NOTE — Progress Notes (Signed)
ID PROGRESS NOTE  ID: 35yo M with LVAD admitted for driveline infection  Remains afebrile Cultures - MSSA  A/P: Narrowed abtx to cefazolin 2gm IV Q 8hr D/C vanco, cefepime, and metronidazole Blood cx still remain NGTD  Lillyan Hitson B. Drue Second MD MPH [facility] [phone]

## 2020-05-01 ENCOUNTER — Inpatient Hospital Stay (HOSPITAL_COMMUNITY): Payer: Medicaid - Out of State

## 2020-05-01 DIAGNOSIS — K083 Retained dental root: Secondary | ICD-10-CM

## 2020-05-01 DIAGNOSIS — K08199 Complete loss of teeth due to other specified cause, unspecified class: Secondary | ICD-10-CM

## 2020-05-01 DIAGNOSIS — K0530 Chronic periodontitis, unspecified: Secondary | ICD-10-CM | POA: Diagnosis not present

## 2020-05-01 DIAGNOSIS — B9561 Methicillin susceptible Staphylococcus aureus infection as the cause of diseases classified elsewhere: Secondary | ICD-10-CM | POA: Diagnosis not present

## 2020-05-01 DIAGNOSIS — I5022 Chronic systolic (congestive) heart failure: Secondary | ICD-10-CM | POA: Diagnosis not present

## 2020-05-01 DIAGNOSIS — K029 Dental caries, unspecified: Secondary | ICD-10-CM

## 2020-05-01 DIAGNOSIS — L03311 Cellulitis of abdominal wall: Secondary | ICD-10-CM | POA: Diagnosis not present

## 2020-05-01 DIAGNOSIS — T827XXA Infection and inflammatory reaction due to other cardiac and vascular devices, implants and grafts, initial encounter: Secondary | ICD-10-CM | POA: Diagnosis not present

## 2020-05-01 DIAGNOSIS — Z95811 Presence of heart assist device: Secondary | ICD-10-CM | POA: Diagnosis not present

## 2020-05-01 LAB — BASIC METABOLIC PANEL
Anion gap: 6 (ref 5–15)
BUN: 5 mg/dL — ABNORMAL LOW (ref 6–20)
CO2: 25 mmol/L (ref 22–32)
Calcium: 8.9 mg/dL (ref 8.9–10.3)
Chloride: 104 mmol/L (ref 98–111)
Creatinine, Ser: 0.91 mg/dL (ref 0.61–1.24)
GFR, Estimated: >60 mL/min (ref >60)
Glucose, Bld: 103 mg/dL — ABNORMAL HIGH (ref 70–99)
Potassium: 3.5 mmol/L (ref 3.5–5.1)
Sodium: 135 mmol/L (ref 135–145)

## 2020-05-01 LAB — CBC
HCT: 31.2 % — ABNORMAL LOW (ref 39.0–52.0)
Hemoglobin: 10 g/dL — ABNORMAL LOW (ref 13.0–17.0)
MCH: 31.2 pg (ref 26.0–34.0)
MCHC: 32.1 g/dL (ref 30.0–36.0)
MCV: 97.2 fL (ref 80.0–100.0)
Platelets: 146 10*3/uL — ABNORMAL LOW (ref 150–400)
RBC: 3.21 MIL/uL — ABNORMAL LOW (ref 4.22–5.81)
RDW: 21.8 % — ABNORMAL HIGH (ref 11.5–15.5)
WBC: 7.2 10*3/uL (ref 4.0–10.5)
nRBC: 0.3 % — ABNORMAL HIGH (ref 0.0–0.2)

## 2020-05-01 LAB — PROTIME-INR
INR: 1.2 (ref 0.8–1.2)
Prothrombin Time: 14.8 seconds (ref 11.4–15.2)

## 2020-05-01 LAB — HEPARIN LEVEL (UNFRACTIONATED): Heparin Unfractionated: <0.1 IU/mL — ABNORMAL LOW (ref 0.30–0.70)

## 2020-05-01 LAB — LACTATE DEHYDROGENASE: LDH: 185 U/L (ref 98–192)

## 2020-05-01 LAB — MAGNESIUM: Magnesium: 1.2 mg/dL — ABNORMAL LOW (ref 1.7–2.4)

## 2020-05-01 MED ORDER — MAGNESIUM SULFATE 4 GM/100ML IV SOLN
4.0000 g | Freq: Once | INTRAVENOUS | Status: AC
  Administered 2020-05-01: 4 g via INTRAVENOUS
  Filled 2020-05-01: qty 100

## 2020-05-01 MED ORDER — HEPARIN (PORCINE) 25000 UT/250ML-% IV SOLN
500.0000 [IU]/h | INTRAVENOUS | Status: DC
  Administered 2020-05-01 – 2020-05-03 (×2): 500 [IU]/h via INTRAVENOUS
  Filled 2020-05-01 (×2): qty 250

## 2020-05-01 MED ORDER — POTASSIUM CHLORIDE CRYS ER 20 MEQ PO TBCR
30.0000 meq | EXTENDED_RELEASE_TABLET | Freq: Two times a day (BID) | ORAL | Status: AC
  Administered 2020-05-01 (×2): 30 meq via ORAL
  Filled 2020-05-01 (×2): qty 1

## 2020-05-01 MED ORDER — WARFARIN SODIUM 5 MG PO TABS
5.0000 mg | ORAL_TABLET | Freq: Once | ORAL | Status: AC
  Administered 2020-05-01: 5 mg via ORAL
  Filled 2020-05-01: qty 1

## 2020-05-01 NOTE — Progress Notes (Signed)
   Driveline dressing per VAD coordinator  Thick tan exudate around driveline.  Copious exudate.  Indurated area inferior portion around driveline. This is new today.   Check CT abd/pelvis. Concern for deep infection. May debridement based on results.   Lashunta Frieden NP-C  10:09 AM

## 2020-05-01 NOTE — Plan of Care (Signed)
  Problem: Education: Goal: Knowledge of General Education information will improve Description: Including pain rating scale, medication(s)/side effects and non-pharmacologic comfort measures Outcome: Progressing   Problem: Health Behavior/Discharge Planning: Goal: Ability to manage health-related needs will improve Outcome: Progressing   Problem: Clinical Measurements: Goal: Ability to maintain clinical measurements within normal limits will improve Outcome: Progressing   Problem: Clinical Measurements: Goal: Will remain free from infection Outcome: Progressing   Problem: Clinical Measurements: Goal: Diagnostic test results will improve Outcome: Progressing   Problem: Nutrition: Goal: Adequate nutrition will be maintained Outcome: Progressing   Problem: Pain Managment: Goal: General experience of comfort will improve Outcome: Progressing   Problem: Safety: Goal: Ability to remain free from injury will improve Outcome: Progressing   Problem: Skin Integrity: Goal: Risk for impaired skin integrity will decrease Outcome: Progressing   Problem: Education: Goal: Patient will understand all VAD equipment and how it functions Outcome: Progressing   Problem: Cardiac: Goal: LVAD will function as expected and patient will experience no clinical alarms Outcome: Progressing

## 2020-05-01 NOTE — Progress Notes (Signed)
Department of Dental Medicine     INPATIENT CONSULTATION  Service Date:   05/01/2020 Admitted Date:  04/28/2020  Patient Name:  Jon Martin Date of Birth:   1985/05/10 Medical Record Number: [id number]  Referring Provider:              Arvilla Meres, MD  PLAN & RECOMMENDATIONS   > There are no clinical signs of acute odontogenic infection including abscess, edema or erythema, or suspicious lesion requiring biopsy;, however cannot be sure without taking a radiograph.  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  He also has multiple other caries and decayed/broken down teeth. >> Recommend ordering an orthopanogram to better assess the area of concern.  If there is nothing pointing towards acute infection on radiograph, then it is a periodontal issue.  Regardless, the tooth roots (#15) will need eventual extraction. >>> Plan to discuss case with medical team and coordinate treatment as needed.  >>  Recommend the patient establish care at a dental office of their choice for routine dental care including replacement of missing teeth, cleanings and exams. >>  Discussed in detail all treatment options with the patient and they are agreeable to the plan.   Thank you for consulting with [facility] and for the opportunity to participate in this patient's treatment.  Should you have any questions or concerns, please contact the [facility] at [phone].   05/01/2020     CONSULT NOTE   COVID 19 SCREENING: The patient denies symptoms concerning for COVID-19 infection including fever, chills, cough, or newly developed shortness of breath.   HISTORY OF PRESENT ILLNESS: >> Jon Martin is a very pleasant 35 y.o. male with h/o severe systolic HF due to NICM EF 15% and prior polysubstance abuse (cocaine and ETOH). He  had LVAD implant placed in January 2021 and is currently admitted for driveline infection.  Hospital dentistry was consulted for a dental evaluation due to intraoral bleeding that started during admission and oozed throughout the weekend which is apparently coming from an extraction site or retained root tips with concern for dental abscess or infection.  DENTAL HISTORY: >The patient reports that he does not have any dental/orofacial pain or sensitivity.  He points to the upper left quadrant (tooth #15) when he explains where he was bleeding.  He reports bleeding from this area on and off for at least 15 years, but he has not had the tooth extracted he says it broke off a long time ago.  He said when it first broke off he had pain and swelling in the area, but since that one time he has just had intermittent issues with the bleeding.  At this time he reports that the bleeding has stopped completely and has not started back again.  He does not know what causes it to happen and says that it is spontaneous. >> Patient is able to manage oral secretions.  Patient denies dysphagia, odynophagia, dysphonia, SOB and neck pain.  Patient denies fever, rigors and malaise.   CHIEF COMPLAINT:  Dental consultation to evaluate bleeding/oozing tooth and concern for odontogenic infection.   Patient Active Problem List   Diagnosis Date Noted  . Complication involving left ventricular assist device (LVAD) 04/28/2020  . Dental infection 04/28/2020  . 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/2020  .  Left ventricular assist device (LVAD) complication 01/12/2020  . Dehydration, moderate 08/02/2019  . Chronic systolic heart failure (HCC) 08/02/2019  . AKI (acute kidney injury) (HCC) 08/02/2019  . Intractable vomiting with nausea 08/02/2019  . LVAD (left ventricular assist device) present (HCC) 08/02/2019  . Hypokalemia 08/02/2019  . Congestive heart failure (HCC)   .  Cardiogenic shock (HCC)   . Goals of care, counseling/discussion   . Preoperative evaluation to rule out surgical contraindication   . Protein-calorie malnutrition, severe 02/11/2019  . Dilated cardiomyopathy (HCC) 02/10/2019   Past Medical History:  Diagnosis Date  . Dilated cardiomyopathy (HCC)   . Dilated cardiomyopathy (HCC) 02/2019  . Polysubstance abuse [facility])    Past Surgical History:  Procedure Laterality Date  . INSERTION OF IMPLANTABLE LEFT VENTRICULAR ASSIST DEVICE N/A 02/26/2019   Procedure: INSERTION OF IMPLANTABLE LEFT VENTRICULAR ASSIST DEVICE;  Surgeon: Kerin Perna, MD;  Location: [facility] OR;  Service: Open Heart Surgery;  Laterality: N/A;  HM3  . IR THORACENTESIS ASP PLEURAL SPACE W/IMG GUIDE  03/10/2019  . RIGHT HEART CATH N/A 02/25/2019   Procedure: RIGHT HEART CATH;  Surgeon: Dolores Patty, MD;  Location: [facility] INVASIVE CV LAB;  Service: Cardiovascular;  Laterality: N/A;  . RIGHT/LEFT HEART CATH AND CORONARY ANGIOGRAPHY N/A 02/11/2019   Procedure: RIGHT/LEFT HEART CATH AND CORONARY ANGIOGRAPHY;  Surgeon: Dolores Patty, MD;  Location: [facility] INVASIVE CV LAB;  Service: Cardiovascular;  Laterality: N/A;  . TEE WITHOUT CARDIOVERSION N/A 02/26/2019   Procedure: TRANSESOPHAGEAL ECHOCARDIOGRAM (TEE);  Surgeon: Donata Clay, Theron Arista, MD;  Location: [facility] OR;  Service: Open Heart Surgery;  Laterality: N/A;  . VIDEO BRONCHOSCOPY N/A 02/26/2019   Procedure: Video Bronchoscopy;  Surgeon: Kerin Perna, MD;  Location: [facility] OR;  Service: Open Heart Surgery;  Laterality: N/A;   No Known Allergies Current Facility-Administered Medications  Medication Dose Route Frequency Provider Last Rate Last Admin  . 0.9 %  sodium chloride infusion  250 mL Intravenous PRN Clegg, Amy D, NP      . 0.9 %  sodium chloride infusion   Intravenous Continuous Jeani Hawking, MD 20 mL/hr at 04/29/20 1646 New Bag at 04/29/20 1646  . acetaminophen (TYLENOL) tablet 650 mg  650 mg Oral Q4H PRN Clegg, Amy D, NP      .  ceFAZolin (ANCEF) IVPB 2g/100 mL premix  2 g Intravenous Q8H Judyann Munson, MD 200 mL/hr at 05/01/20 1704 2 g at 05/01/20 1704  . heparin ADULT infusion 100 units/mL (25000 units/259mL)  500 Units/hr Intravenous Continuous Tama Headings, RPH 5 mL/hr at 05/01/20 1154 500 Units/hr at 05/01/20 1154  . ondansetron (ZOFRAN) injection 4 mg  4 mg Intravenous Q6H PRN Clegg, Amy D, NP      . pantoprazole (PROTONIX) EC tablet 40 mg  40 mg Oral Daily Clegg, Amy D, NP   40 mg at 05/01/20 0906  . potassium chloride (KLOR-CON) CR tablet 30 mEq  30 mEq Oral BID Tama Headings, RPH   30 mEq at 05/01/20 0906  . sodium chloride flush (NS) 0.9 % injection 3 mL  3 mL Intravenous Q12H Clegg, Amy D, NP   3 mL at 04/30/20 2130  . Warfarin - Pharmacist Dosing Inpatient   Does not apply q1600 Earnie Larsson, [facility]   Given at 04/30/20 1614    LABS: Lab Results  Component Value Date   WBC 7.2 05/01/2020   HGB 10.0 (L) 05/01/2020   HCT 31.2 (L) 05/01/2020   MCV 97.2 05/01/2020   PLT  146 (L) 05/01/2020      Component Value Date/Time   NA 135 05/01/2020 0034   K 3.5 05/01/2020 0034   CL 104 05/01/2020 0034   CO2 25 05/01/2020 0034   GLUCOSE 103 (H) 05/01/2020 0034   BUN 5 (L) 05/01/2020 0034   CREATININE 0.91 05/01/2020 0034   CALCIUM 8.9 05/01/2020 0034   GFRNONAA >60 05/01/2020 0034   GFRAA >60 10/20/2019 1102   Lab Results  Component Value Date   INR 1.2 05/01/2020   INR 1.5 (H) 04/30/2020   INR 1.7 (H) 04/29/2020   No results found for: PTT  Social History   Socioeconomic History  . Marital status: Single    Spouse name: Not on file  . Number of children: Not on file  . Years of education: Not on file  . Highest education level: Not on file  Occupational History  . Not on file  Tobacco Use  . Smoking status: Former Smoker    Types: Cigarettes  . Smokeless tobacco: Never Used  Vaping Use  . Vaping Use: Some days  Substance and Sexual Activity  . Alcohol use: Yes  . Drug use: Not  Currently    Comment: Hx of cocaine use  . Sexual activity: Not on file  Other Topics Concern  . Not on file  Social History Narrative  . Not on file   Social Determinants of Health   Financial Resource Strain: High Risk  . Difficulty of Paying Living Expenses: Hard  Food Insecurity: Food Insecurity Present  . Worried About Programme researcher, broadcasting/film/video in the Last Year: Sometimes true  . Ran Out of Food in the Last Year: Sometimes true  Transportation Needs: Unmet Transportation Needs  . Lack of Transportation (Medical): Yes  . Lack of Transportation (Non-Medical): No  Physical Activity: Not on file  Stress: Not on file  Social Connections: Not on file  Intimate Partner Violence: Not on file   Family History  Problem Relation Age of Onset  . CAD Father      REVIEW OF SYSTEMS: Reviewed with the patient as per HPI. PSYCH: Patient denies having dental phobia.  VITAL SIGNS: BP 93/69 (BP Location: Right Arm)   Pulse 61   Temp 98.4 F (36.9 C) (Oral)   Resp 16   Wt 56.3 kg   SpO2 99%   BMI 20.65 kg/m    PHYSICAL EXAM: >> General:  Well-developed, comfortable and in no apparent distress. >> Neurological:  Alert and oriented to person, place and  time. >> Extraoral:  Facial symmetry present without any edema or erythema.  No swelling or lymphadenopathy.  TMJ asymptomatic without clicks or crepitations. >> Intraoral:  Soft tissues appear well-perfused and mucous membranes moist.  FOM and vestibules soft and not raised. Oral cavity without mass or lesion. No signs of infection, parulis, sinus tract, edema or erythema evident upon exam.   DENTAL EXAM: All clinical findings charted.   >> Dentition: Difficult to assess overall remaining dentition without radiographs; clinically appears fair.  Missing teeth, caries, retained root tips.   >> The patient is maintaining fair oral hygiene. >> Periodontal: Inflamed, erythematous gingival tissue. Generalized plaque accumulation. >> Caries:  Multiple clinical caries. #15 grossly decayed. >> Retained Root Tips: #4/#5 and #15 >> Removable/Fixed Prosthodontics: Patient denies wearing partial dentures.   RADIOGRAPHIC EXAM: Recommend taking an orthopanogram while patient is admitted to better evaluate and assess dentition and osseous structures.   ASSESSMENT:  1. LVAD driveline infection 2. Inpatient dental  consultation 3. Missing teeth 4. Caries 5. Retained root tip 6. Gingivitis 7. Chronic apical periodontitis 8. Postoperative bleeding risk   PLAN AND RECOMMENDATIONS: > I discussed the risks, benefits, and complications of various scenarios with the patient in relationship to their medical and dental conditions, which included systemic infection such as endocarditis, bacteremia or other serious issues that could potentially occur if dental/oral concerns are not addressed.  I explained all significant findings of the dental consultation with the patient including his chief complaint and the area of concern tooth #15 which is a retained root tip and grossly decayed also causing his gums to be chronically inflamed/irritated surrounding the tooth remnants (which can cause intermittent bleeding fare-ups), and the recommended care including eventual extraction of tooth #15 and other root tips/non-restorable teeth in order to optimize him from a dental standpoint.  The patient verbalized understanding of all findings, discussion, and recommendations. >> We then discussed various potential treatment options, however cannot definitely discuss until Xray is taken. If the tooth #15 is not acutely infected, which it does not appear to be clinically and seems more like a periodontal issue due to inflammation and chronic periodontal infection, explained that he still needs to have his oral health addressed otherwise he will continue to have teeth break down and increase his risk for systemic infection.  The patient verbalized understanding of all  options. >>>  Plan to discuss all findings and recommendations with medical team and coordinate future care as needed.  <> The patient tolerated today's visit well.  All questions and concerns were addressed and answered before conclusion of the consultation.   Johnnisha Forton B. Chales Salmon, D.M.D.

## 2020-05-01 NOTE — Progress Notes (Addendum)
ANTICOAGULATION CONSULT NOTE  Pharmacy Consult for warfarin + heparin Indication: LVAD  No Known Allergies  Patient Measurements: Weight: 56.3 kg (124 lb 1.9 oz)  Heparin dosing weight:   Vital Signs: Temp: 98.4 F (36.9 C) (03/28 0839) Temp Source: Oral (03/28 0839) BP: 88/51 (03/28 0839) Pulse Rate: 99 (03/28 0839)  Labs: Recent Labs    04/29/20 0042 04/30/20 0308 05/01/20 0034  HGB 6.1* 10.5* 10.0*  HCT 18.8* 31.5* 31.2*  PLT 101* 126* 146*  LABPROT 19.3* 17.2* 14.8  INR 1.7* 1.5* 1.2  CREATININE 0.76 0.74 0.91    Estimated Creatinine Clearance: 91.1 mL/min (by C-G formula based on SCr of 0.91 mg/dL).   Medical History: Past Medical History:  Diagnosis Date  . Dilated cardiomyopathy (HCC)   . Dilated cardiomyopathy (HCC) 02/2019  . Polysubstance abuse (HCC)     Assessment: 3 yom presenting with concern for driveline infection - hx HM3 implanted on 02/26/19 - on warfarin PTA. PTA regimen is 2.5 mg daily.   INR down to 1.2 today. Patient reports that bleeding in his mouth has resolved. No other overt bleeding. Hgb has stabilized to 10s, pltc wnl. Holding off on enteroscopy. Will start low, fixed dose heparin bridge now.   Goal of Therapy:  Heparin level <0.3 INR 2-2.5 Monitor platelets by anticoagulation protocol: Yes   Plan:  Warfarin 5 mg po x 1 tonight. Start IV heparin 500 units/hr at fixed rate Check 6-hr HL Daily INR, HL, s/sx bleeding  Tama Headings, PharmD, BCPS PGY2 Cardiology Pharmacy Resident Phone: [phone] 05/01/2020  8:54 AM  Please check [email or web address] for unit-specific pharmacy phone numbers.

## 2020-05-01 NOTE — Progress Notes (Signed)
ANTICOAGULATION CONSULT NOTE  Pharmacy Consult for warfarin + heparin Indication: LVAD  No Known Allergies  Patient Measurements: Weight: 56.3 kg (124 lb 1.9 oz)  Heparin dosing weight:   Vital Signs: Temp: 97.6 F (36.4 C) (03/28 1927) Temp Source: Oral (03/28 1927) BP: 117/92 (03/28 1927) Pulse Rate: 72 (03/28 1927)  Labs: Recent Labs    04/29/20 0042 04/30/20 0308 05/01/20 0034 05/01/20 1751  HGB 6.1* 10.5* 10.0*  --   HCT 18.8* 31.5* 31.2*  --   PLT 101* 126* 146*  --   LABPROT 19.3* 17.2* 14.8  --   INR 1.7* 1.5* 1.2  --   HEPARINUNFRC  --   --   --  <0.10*  CREATININE 0.76 0.74 0.91  --     Estimated Creatinine Clearance: 91.1 mL/min (by C-G formula based on SCr of 0.91 mg/dL).   Medical History: Past Medical History:  Diagnosis Date  . Dilated cardiomyopathy (HCC)   . Dilated cardiomyopathy (HCC) 02/2019  . Polysubstance abuse (HCC)     Assessment: 57 yom presenting with concern for driveline infection - hx HM3 implanted on 02/26/19 - on warfarin PTA. PTA regimen is 2.5 mg daily.   INR down to 1.2 today. Patient reports that bleeding in his mouth has resolved. No other overt bleeding. Hgb has stabilized to 10s, pltc wnl. Holding off on enteroscopy. Will start low, fixed dose heparin bridge now.  Update: Heparin level remains <0.3 - ok to conitnue fixed dose.    Goal of Therapy:  Heparin level <0.3 INR 2-2.5 Monitor platelets by anticoagulation protocol: Yes   Plan:  Continue IV heparin 500 units/hr at fixed rate Daily INR, HL, s/sx bleeding  Link Snuffer, PharmD, BCPS, BCCCP Clinical Pharmacist Please refer to [facility] for [facility] Pharmacy numbers 05/01/2020  8:19 PM  Please check [email or web address] for unit-specific pharmacy phone numbers.

## 2020-05-01 NOTE — Progress Notes (Signed)
CSW attempted to visit with patient at bedside to offer support although patient sleeping. CSW available if needs arise related to VAD support. Lasandra Beech, LCSW, CCSW-MCS [phone]

## 2020-05-01 NOTE — Progress Notes (Signed)
[facility]  Date of Admission:  04/28/2020      Total days of antibiotics 4  Cefazolin 3/27 >> C  Vanc/Cefepime/Metronidazole 3/25 >> 3/28          ASSESSMENT: Jon Martin is a 35 y.o. male with LVAD here for acute onset cellulitis following trauma to his driveline. Light growth with MSSA and has been narrowed to Cefazolin. He has remained afebrile and no leukocytosis. Site has no further pain and reports no drainage from last dressing change, much improved. I would prefer to do a dose of oritavancin to continue treating his abdominal wall cellulitis as he nears discharge to spare him of any PICC related complications and high frequency pill burden with orals.  Will D/W Dr. Gala Romney.   Teeth do not appear to have acute infection but broken and need removal, uncertain if this will happen inpatient or not. Dentistry to follow. I don't feel that he needs any anaerobic coverage at this time, but will follow.    PLAN: 1. Continue cefazolin for now 2. Planning one dose of IV oritiavancin prior to discharge (no PICC needed)  3. Follow dentistry assessment / plan    Principal Problem:   Infection associated with driveline of left ventricular assist device (LVAD) (HCC) Active Problems:   Chronic systolic heart failure (HCC)   Left ventricular assist device (LVAD) complication   Complication involving left ventricular assist device (LVAD)   Dental infection   . pantoprazole  40 mg Oral Daily  . potassium chloride  30 mEq Oral BID  . sodium chloride flush  3 mL Intravenous Q12H  . Warfarin - Pharmacist Dosing Inpatient   Does not apply q1600    SUBJECTIVE: Doing much better. No dental pain but had some bleeding over the w/e. No drainage in mouth.  Driveline was last changed yesterday and no further drainage or tenderness at the site.  No fevers.    Review of Systems: Review of Systems  Constitutional: Negative for chills, fever,  malaise/fatigue and weight loss.  HENT: Negative for sore throat.        No pain in mouth, only some bleeding.   Respiratory: Negative for cough and sputum production.   Cardiovascular: Negative for chest pain and leg swelling.  Gastrointestinal: Negative for abdominal pain, diarrhea and vomiting.  Genitourinary: Negative for dysuria and flank pain.  Musculoskeletal: Negative for joint pain, myalgias and neck pain.  Skin: Negative for rash.  Neurological: Negative for dizziness, tingling and headaches.  Psychiatric/Behavioral: Negative for depression and substance abuse. The patient is not nervous/anxious and does not have insomnia.     No Known Allergies   OBJECTIVE: Vitals:   04/30/20 2124 04/30/20 2316 05/01/20 0300 05/01/20 0839  BP: 111/82 98/84 103/82 (!) 88/51  Pulse:  80 88 99  Resp:  16 17 19   Temp:  98.5 F (36.9 C) 98.3 F (36.8 C) 98.4 F (36.9 C)  TempSrc:  Oral Oral Oral  SpO2:  98% 99%   Weight:   56.3 kg    Body mass index is 20.65 kg/m.  Physical Exam HENT:     Mouth/Throat:     Mouth: No oral lesions.     Dentition: Normal dentition. No dental caries.     Comments: Multiple broken molars down to gumline. No erythema, drainage or signs of infection.  Eyes:     General: No scleral icterus. Cardiovascular:     Rate and Rhythm: Normal  rate.     Heart sounds: Normal heart sounds.     Comments: +LVAD humm Pulmonary:     Effort: Pulmonary effort is normal.     Breath sounds: Normal breath sounds.  Abdominal:     General: There is no distension.     Palpations: Abdomen is soft.     Tenderness: There is no abdominal tenderness.     Comments: Dressing clean and dry with a little dried blood on anchor. No tenderness with direct palpation over his exitsite.   Lymphadenopathy:     Cervical: No cervical adenopathy.  Skin:    General: Skin is warm and dry.     Findings: No rash.  Neurological:     Mental Status: He is alert and oriented to person, place,  and time.     Lab Results Lab Results  Component Value Date   WBC 7.2 05/01/2020   HGB 10.0 (L) 05/01/2020   HCT 31.2 (L) 05/01/2020   MCV 97.2 05/01/2020   PLT 146 (L) 05/01/2020    Lab Results  Component Value Date   CREATININE 0.91 05/01/2020   BUN 5 (L) 05/01/2020   NA 135 05/01/2020   K 3.5 05/01/2020   CL 104 05/01/2020   CO2 25 05/01/2020    Lab Results  Component Value Date   ALT 32 03/29/2020   AST 107 (H) 03/29/2020   ALKPHOS 76 03/29/2020   BILITOT 0.5 03/29/2020     Microbiology: Recent Results (from the past 240 hour(s))  Aerobic Culture w Gram Stain (superficial specimen)     Status: None   Collection Time: 04/28/20 11:31 AM   Specimen: Wound  Result Value Ref Range Status   Specimen Description WOUND ABD  Final   Special Requests NONE  Final   Gram Stain   Final    RARE WBC PRESENT, PREDOMINANTLY MONONUCLEAR RARE GRAM POSITIVE RODS Performed at [facility], [address], [place], [place] [address]    Culture RARE STAPHYLOCOCCUS AUREUS  Final   Report Status 04/30/2020 FINAL  Final   Organism ID, Bacteria STAPHYLOCOCCUS AUREUS  Final      Susceptibility   Staphylococcus aureus - MIC*    CIPROFLOXACIN 1 SENSITIVE Sensitive     ERYTHROMYCIN >=8 RESISTANT Resistant     GENTAMICIN <=0.5 SENSITIVE Sensitive     OXACILLIN <=0.25 SENSITIVE Sensitive     TETRACYCLINE <=1 SENSITIVE Sensitive     VANCOMYCIN <=0.5 SENSITIVE Sensitive     TRIMETH/SULFA <=10 SENSITIVE Sensitive     CLINDAMYCIN RESISTANT Resistant     RIFAMPIN <=0.5 SENSITIVE Sensitive     Inducible Clindamycin POSITIVE Resistant     * RARE STAPHYLOCOCCUS AUREUS  Culture, blood (Routine X 2) w Reflex to ID Panel     Status: None (Preliminary result)   Collection Time: 04/28/20  1:03 PM   Specimen: BLOOD  Result Value Ref Range Status   Specimen Description BLOOD SITE NOT SPECIFIED  Final   Special Requests   Final    BOTTLES DRAWN AEROBIC AND ANAEROBIC Blood Culture results  may not be optimal due to an excessive volume of blood received in culture bottles   Culture   Final    NO GROWTH 3 DAYS Performed at [facility], [address], [place], [place] [address]    Report Status PENDING  Incomplete  SARS CORONAVIRUS 2 (TAT 6-24 HRS) Nasopharyngeal     Status: None   Collection Time: 04/28/20  3:26 PM  Specimen: Nasopharyngeal  Result Value Ref Range Status   SARS Coronavirus 2 NEGATIVE NEGATIVE Final    Comment: (NOTE) SARS-CoV-2 target nucleic acids are NOT DETECTED.  The SARS-CoV-2 RNA is generally detectable in upper and lower respiratory specimens during the acute phase of infection. Negative results do not preclude SARS-CoV-2 infection, do not rule out co-infections with other pathogens, and should not be used as the sole basis for treatment or other patient management decisions. Negative results must be combined with clinical observations, patient history, and epidemiological information. The expected result is Negative.  Fact Sheet for Patients: HairSlick.no  Fact Sheet for Healthcare Providers: quierodirigir.com  This test is not yet approved or cleared by the [place] FDA and  has been authorized for detection and/or diagnosis of SARS-CoV-2 by FDA under an Emergency Use Authorization (EUA). This EUA will remain  in effect (meaning this test can be used) for the duration of the COVID-19 declaration under Se ction 564(b)(1) of the Act, 21 U.S.C. section 360bbb-3(b)(1), unless the authorization is terminated or revoked sooner.  Performed at [facility], [address] [place], [place] [address]   MRSA PCR Screening     Status: None   Collection Time: 04/28/20 10:59 PM   Specimen: Nasopharyngeal  Result Value Ref Range Status   MRSA by PCR NEGATIVE NEGATIVE Final    Comment:        The GeneXpert MRSA Assay (FDA approved for NASAL specimens only), is one component  of a comprehensive MRSA colonization surveillance program. It is not intended to diagnose MRSA infection nor to guide or monitor treatment for MRSA infections. Performed at [facility], [address], [place], [place] [address]     Rexene Alberts, MSN, NP-C [facility]  [email or web address] Pager: [phone] Office: [phone] [facility] Main Line: [phone]

## 2020-05-01 NOTE — Progress Notes (Addendum)
Patient ID: Jon Martin, male   DOB: 1985/07/19, 35 y.o.   MRN: [id number]   Advanced Heart Failure VAD Team Note  PCP-Cardiologist: No primary care provider on file.   Subjective:     HM3 LVAD admitted w/ Drive-line infection.  - Wound culture with S aureus. Blood cx NGTD - 3/26 abx narrowed to cefazolin 2gm IV Q 8hr. ID following   Remains AF. WBC normal. Feels well. No complaints.   INR 1.2. Hgb stable at 10. LDH 185  MAP low 90s.   LVAD INTERROGATION:  HeartMate 3 LVAD:   Flow 3.5 liters/min, speed 5400, power 3.8,  PI  6.4. 3 PI events      Objective:    Vital Signs:   Temp:  [97.8 F (36.6 C)-98.7 F (37.1 C)] 98.3 F (36.8 C) (03/28 0300) Pulse Rate:  [67-88] 88 (03/28 0300) Resp:  [16-18] 17 (03/28 0300) BP: (98-111)/(72-97) 103/82 (03/28 0300) SpO2:  [98 %-99 %] 99 % (03/28 0300) Weight:  [56.3 kg] 56.3 kg (03/28 0300) Last BM Date: 04/29/20 Mean arterial Pressure 70s-80s  Intake/Output:   Intake/Output Summary (Last 24 hours) at 05/01/2020 0746 Last data filed at 05/01/2020 0300 Gross per 24 hour  Intake 1496.65 ml  Output 1455 ml  Net 41.65 ml     Physical Exam    General: Well appearing young AAM. Breathing normally. No distress.  HEENT: Normal. Neck: Supple, No JVD.  No carotid bruits  Cardiac:  Mechanical heart sounds with LVAD hum present.  Lungs:  Clear bilaterally. No wheezing   Abdomen:  NT, ND, no HSM. No bruits or masses. +BS  LVAD exit site: Well-healed and incorporated. Dressing dry and intact. No erythema or drainage. Stabilization device present and accurately applied. Driveline dressing changed daily per sterile technique. Extremities:  Warm and dry. No cyanosis, clubbing, rash, or edema.  Neuro:  Alert & oriented x 3. Cranial nerves grossly intact. Moves all 4 extremities w/o difficulty. Affect pleasant     Telemetry   NSR 80s-90s (personally reviewed)  Labs   Basic Metabolic Panel: Recent Labs  Lab 04/28/20 0955  04/29/20 0042 04/30/20 0308 05/01/20 0034  NA 133* 133* 135 135  K 3.6 3.3* 4.0 3.5  CL 100 102 104 104  CO2 27 24 24 25   GLUCOSE 135* 109* 105* 103*  BUN 9 6 5* 5*  CREATININE 0.84 0.76 0.74 0.91  CALCIUM 9.3 8.5* 9.3 8.9    Liver Function Tests: No results for input(s): AST, ALT, ALKPHOS, BILITOT, PROT, ALBUMIN in the last 168 hours. No results for input(s): LIPASE, AMYLASE in the last 168 hours. No results for input(s): AMMONIA in the last 168 hours.  CBC: Recent Labs  Lab 04/28/20 0955 04/29/20 0042 04/30/20 0308 05/01/20 0034  WBC 6.5 6.0 6.2 7.2  HGB 7.7* 6.1* 10.5* 10.0*  HCT 23.9* 18.8* 31.5* 31.2*  MCV 94.5 96.4 95.2 97.2  PLT 111* 101* 126* 146*    INR: Recent Labs  Lab 04/28/20 0955 04/29/20 0042 04/30/20 0308 05/01/20 0034  INR 2.3* 1.7* 1.5* 1.2    Other results:  EKG:    Imaging   No results found.   Medications:     Scheduled Medications: . pantoprazole  40 mg Oral Daily  . sodium chloride flush  3 mL Intravenous Q12H  . Warfarin - Pharmacist Dosing Inpatient   Does not apply q1600    Infusions: . sodium chloride    . sodium chloride 20 mL/hr at 04/29/20 1646  .  ceFAZolin (ANCEF) IV 2 g (05/01/20 0052)    PRN Medications: sodium chloride, acetaminophen, ondansetron (ZOFRAN) IV   Patient Profile   35 y/o male w/ chronic systolic HF s/p HM3 LVAD admitted w/ staph aureus drive-line infection  Assessment/Plan:    1. 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 - Also concern for odontogenic infection, will need to see dentist (Monday) - Admitted for IV abx vancomycin/cefepime/Flagyl - Driveline culture with S aureus. Blood cx NGTD  - 3/27 abx narrowed to cefazolin 2gm IV Q 8hr, per ID - If not responding well will need CT imaging   2.  Chronic Systolic HF due to NICM - Echo EF 10% with biventricular failure - HM-3 VAD implant 02/26/19 - Echo 7/21 EF 40-45% - No bb or ARB with  previous dehydration/RV failure.  - VAD interrogated personally. Parameters stable.  - INR 1.2. LDL 185 - Start IV heparin + warfarin tonight. D/w pharmD   3. Anemia - Baseline hgb has been upper 9's, admission hgb 7.7 => 6.1.  However, hgb up to 10.5 after 2 units PRCs and stool was heme negative.  Does not appear to be actively GI bleeding, ?initial drop dilutional with IVF at admission.  Discussed with GI, will hold off enteroscopy and follow hgb.  - Continue warfarin for INR 2-2.5 for now.   4. F/E/N - We continue to worry about his nutritional status after his mother died.   I reviewed the LVAD parameters from today, and compared the results to the patient's prior recorded data.  No programming changes were made.  The LVAD is functioning within specified parameters.  The patient performs LVAD self-test daily.  LVAD interrogation was negative for any significant power changes, alarms or PI events/speed drops.  LVAD equipment check completed and is in good working order.  Back-up equipment present.   LVAD education done on emergency procedures and precautions and reviewed exit site care.  Length of Stay: [address] Delmer Islam 05/01/2020, 7:46 AM  VAD Team --- VAD ISSUES ONLY--- Pager [phone] (7am - 7am)  Advanced Heart Failure Team  Pager [phone] (M-F; 7a - 5p)  Please contact [facility] for night-coverage after hours (5p -7a ) and weekends on [email or web address]   Patient seen and examined with the above-signed Advanced Practice Provider and/or Housestaff. I personally reviewed laboratory data, imaging studies and relevant notes. I independently examined the patient and formulated the important aspects of the plan. I have edited the note to reflect any of my changes or salient points. I have personally discussed the plan with the patient and/or family.  Wound cultures from DL + for MSSA. Now on cefazolin per ID. No fevers or chills. Received 2u RBCs for drop in hgb with  aggressive rise. Hgb now stable. Heme negative. GI cancelled enteroscopy. Awaiting dental input.   General:  Lying in bed  HEENT: normal  Neck: supple. JVP not elevated.  Carotids 2+ bilat; no bruits. No lymphadenopathy or thryomegaly appreciated. Cor: LVAD hum.  Lungs: Clear. Abdomen: obese soft, nontender, non-distended. No hepatosplenomegaly. No bruits or masses. Good bowel sounds. Driveline site dressing clean. Nontender . Anchor in place.  Extremities: no cyanosis, clubbing, rash. Warm no edema  Neuro: alert & oriented x 3. No focal deficits. Moves all 4 without problem   Will continue cefazolin for DL infection. Will need to d/w ID whether or not he can transition to po abx at d/c or needs two weeks IV. Now  on heparin/warfarin. INR 1.2. No bleeding. Discussed dosing with PharmD personally. Pending dental eval today. Volume status ok. VAD interrogated personally. Parameters stable. Supp K.   Arvilla Meres, MD  8:50 AM

## 2020-05-01 NOTE — Plan of Care (Signed)
  Problem: Education: Goal: Knowledge of General Education information will improve Description: Including pain rating scale, medication(s)/side effects and non-pharmacologic comfort measures Outcome: Progressing   Problem: Health Behavior/Discharge Planning: Goal: Ability to manage health-related needs will improve Outcome: Progressing   Problem: Clinical Measurements: Goal: Ability to maintain clinical measurements within normal limits will improve Outcome: Progressing Goal: Will remain free from infection Outcome: Progressing Goal: Diagnostic test results will improve Outcome: Progressing Goal: Respiratory complications will improve Outcome: Progressing Goal: Cardiovascular complication will be avoided Outcome: Progressing   Problem: Activity: Goal: Risk for activity intolerance will decrease Outcome: Progressing   Problem: Nutrition: Goal: Adequate nutrition will be maintained Outcome: Progressing   Problem: Elimination: Goal: Will not experience complications related to bowel motility Outcome: Progressing Goal: Will not experience complications related to urinary retention Outcome: Progressing   Problem: Pain Managment: Goal: General experience of comfort will improve Outcome: Progressing   Problem: Safety: Goal: Ability to remain free from injury will improve Outcome: Progressing   Problem: Skin Integrity: Goal: Risk for impaired skin integrity will decrease Outcome: Progressing   Problem: Education: Goal: Patient will understand all VAD equipment and how it functions Outcome: Progressing Goal: Patient will be able to verbalize current INR target range and antiplatelet therapy for discharge home Outcome: Progressing   Problem: Cardiac: Goal: LVAD will function as expected and patient will experience no clinical alarms Outcome: Progressing

## 2020-05-01 NOTE — Progress Notes (Addendum)
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   HM III LVAD implanted on 02/26/19 by Dr. Maren Beach under Destination Therapy criteria.  Patient laying in resting this morning. Denies pain. Reports good appetite.    Reports he is no longer having thick brown/bloody drainage from the back of his mouth. Odor from mouth improved. Called and left message for Dr Chales Salmon for dental consult.       1 LOW FLOW noted on VAD interrogation from yesterday evening. Pt was asymptomatic at time of event. Given 500 cc NS bolus at that time. No further alarms noted. PUMP OFF noted on interrogation is from where controller and modular cable were exchanged in clinic on Friday.  Drive line dressing change completed. See note below. Thick tan/milky exudate noted. Area of induration noted tracking across abdomen. Slight redness over swollen area noted.  Able to express more drainage from site while pressing on indurated area. CT abd/pelvis without contrast ordered per Dr Gala Romney.   Vital signs: Temp: 97.7 HR: 78 Doppler Pressure: 86 Automatic BP: 117/92 (99) O2 Sat: 100% RA Wt: 122.8>123>124.1 lbs   LVAD interrogation reveals:  Speed: 5400 Flow: 3.6 Power:  3.9w PI: 6.4  Hct: 20 - do not change  Alarms: none Events: rare   Fixed speed: 5400 Low speed limit: 5100  Drive Line:  Pt's drive line is not secure in anchor this morning. Existing VAD dressing removed and site care performed using sterile technique. Drive line exit site cleaned with Chlora prep applicators x 2, allowed to dry.  Cleansed with sterile saline wipe x 2, allowed to dry and gauze dressing with silver strip re-applied. Silk tape used to adhere dressing. Exit site incorporated.The velour is fully implanted at exit site. Moderate amount of thick, tan, milky drainage noted at site with no foul odor or tenderness noted. Area of induration noted tracking across abdomen. Slight  redness over swollen area noted.  Able to express more drainage from site while pressing on indurated area. Drive line anchor replaced. Continue daily dressing changes per BS nurse. Next dressing change due 05/02/20.   Labs:  LDH trend: 181>171>185  INR trend: 2.3>1.7>1.2  Anticoagulation Plan: -INR Goal: 2.0 - 2.5 -ASA Dose: none  Blood Products:  - 04/29/20>>2 units PCs  Device: N/A  Infection:  - 04/28/20 BCs>> No growth x 3 days - 04/28/20 DL culture>>rare staph aureus; final  Plan/Recommendations:  1. Call VAD Coordinator if any VAD equipment or drive line issues. 2. Daily dressing changes per BS nurse.  Alyce Pagan RN VAD Coordinator  Office: [phone]  24/7 Pager: [phone]

## 2020-05-02 ENCOUNTER — Inpatient Hospital Stay (HOSPITAL_COMMUNITY): Payer: Medicaid - Out of State

## 2020-05-02 DIAGNOSIS — T827XXA Infection and inflammatory reaction due to other cardiac and vascular devices, implants and grafts, initial encounter: Secondary | ICD-10-CM | POA: Diagnosis not present

## 2020-05-02 DIAGNOSIS — B9561 Methicillin susceptible Staphylococcus aureus infection as the cause of diseases classified elsewhere: Secondary | ICD-10-CM | POA: Diagnosis not present

## 2020-05-02 DIAGNOSIS — I5022 Chronic systolic (congestive) heart failure: Secondary | ICD-10-CM | POA: Diagnosis not present

## 2020-05-02 DIAGNOSIS — L0889 Other specified local infections of the skin and subcutaneous tissue: Secondary | ICD-10-CM

## 2020-05-02 DIAGNOSIS — Z95811 Presence of heart assist device: Secondary | ICD-10-CM | POA: Diagnosis not present

## 2020-05-02 LAB — HEPARIN LEVEL (UNFRACTIONATED): Heparin Unfractionated: <0.1 IU/mL — ABNORMAL LOW (ref 0.30–0.70)

## 2020-05-02 LAB — CBC
HCT: 30 % — ABNORMAL LOW (ref 39.0–52.0)
Hemoglobin: 10 g/dL — ABNORMAL LOW (ref 13.0–17.0)
MCH: 32.1 pg (ref 26.0–34.0)
MCHC: 33.3 g/dL (ref 30.0–36.0)
MCV: 96.2 fL (ref 80.0–100.0)
Platelets: 185 10*3/uL (ref 150–400)
RBC: 3.12 MIL/uL — ABNORMAL LOW (ref 4.22–5.81)
RDW: 21.6 % — ABNORMAL HIGH (ref 11.5–15.5)
WBC: 7.3 10*3/uL (ref 4.0–10.5)
nRBC: 0 % (ref 0.0–0.2)

## 2020-05-02 LAB — BASIC METABOLIC PANEL
Anion gap: 6 (ref 5–15)
BUN: 5 mg/dL — ABNORMAL LOW (ref 6–20)
CO2: 26 mmol/L (ref 22–32)
Calcium: 8.6 mg/dL — ABNORMAL LOW (ref 8.9–10.3)
Chloride: 104 mmol/L (ref 98–111)
Creatinine, Ser: 0.72 mg/dL (ref 0.61–1.24)
GFR, Estimated: >60 mL/min (ref >60)
Glucose, Bld: 117 mg/dL — ABNORMAL HIGH (ref 70–99)
Potassium: 3.6 mmol/L (ref 3.5–5.1)
Sodium: 136 mmol/L (ref 135–145)

## 2020-05-02 LAB — PROTIME-INR
INR: 1.1 (ref 0.8–1.2)
Prothrombin Time: 13.5 seconds (ref 11.4–15.2)

## 2020-05-02 LAB — LACTATE DEHYDROGENASE: LDH: 196 U/L — ABNORMAL HIGH (ref 98–192)

## 2020-05-02 LAB — MAGNESIUM: Magnesium: 2.1 mg/dL (ref 1.7–2.4)

## 2020-05-02 MED ORDER — WARFARIN SODIUM 2.5 MG PO TABS
2.5000 mg | ORAL_TABLET | Freq: Once | ORAL | Status: AC
  Administered 2020-05-02: 2.5 mg via ORAL
  Filled 2020-05-02: qty 1

## 2020-05-02 MED ORDER — POTASSIUM CHLORIDE CRYS ER 20 MEQ PO TBCR
40.0000 meq | EXTENDED_RELEASE_TABLET | Freq: Once | ORAL | Status: AC
  Administered 2020-05-02: 40 meq via ORAL
  Filled 2020-05-02: qty 2

## 2020-05-02 NOTE — Progress Notes (Signed)
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   HM III LVAD implanted on 02/26/19 by Dr. Maren Beach under Destination Therapy criteria.  Patient sitting up in bed in watching TV this morning. Denies pain. Reports good appetite.   Pt seen by Dr Chales Salmon yesterday:  > There are no clinical signs of acute odontogenic infection including abscess, edema or erythema, or suspicious lesion requiring biopsy;, however cannot be sure without taking a radiograph.  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  He also has multiple other caries and decayed/broken down teeth. >> Recommend ordering an orthopanogram to better assess the area of concern.  If there is nothing pointing towards acute infection on radiograph, then it is a periodontal issue.  Regardless, the tooth roots (#15) will need eventual extraction. >>> Plan to discuss case with medical team and coordinate treatment as needed.  Orthopantogram ordered this morning:   Fracture and periodontoidal lucency of a LEFT maxillary molar, Q2 #7.  CT abdomen/pelvis obtained yesterday:   1. Subcutaneus soft tissue edema surrounding driveline insertion site along the left anterior abdomen with no associated organized fluid collection. Finding could represent an area of infection versus scarring. Please note markedly limited evaluation due to noncontrast study and streak artifact originating from the lineinsertion. Recommend clinical correlation.  2. Otherwise no acute intra-abdominal or intrapelvic abnormality on this noncontrast study.  Dr Laneta Simmers aware of CT results. Will continue dressing changes and antibiotics per ID team at this time.   Vital signs: Temp: 98 HR: 77 Doppler Pressure: 82 Automatic BP: 102/87 (93) O2 Sat: 97% RA Wt:  122.8>123>124.1>120.8 lbs   LVAD interrogation reveals:  Speed: 5400 Flow: 3.8 Power: 4.0w PI: 5.3  Hct: 20 - do not change  Alarms: none Events: rare   Fixed speed: 5400 Low speed limit: 5100  Drive Line:  Pt's anchor is dangling of his side this morning. Existing VAD dressing removed and site care performed using sterile technique. Drive line exit site cleaned with Chlora prep applicators x 2, allowed to dry.  Cleansed with sterile saline wipe x 2, allowed to dry and gauze dressing with silver strip re-applied. Silk tape used to adhere dressing. Exit site incorporated.The velour is fully implanted at exit site. Small amount of yellow/brown drainage noted on silver strip and gauze with no foul odor or tenderness noted. Area of induration noted tracking across abdomen. Redness improved, but swelling still present. Drive line anchor replaced. Continue daily dressing changes using silk tape per BS nurse. Next dressing change due 05/03/20.    Labs:  LDH trend: 181>171>185>196  INR trend: 2.3>1.7>1.2>1.1  Anticoagulation Plan: -INR Goal: 2.0 - 2.5 -ASA Dose: none  Blood Products:  - 04/29/20>>2 units PCs  Device: N/A  Infection:  - 04/28/20 BCs>> No growth x 3 days - 04/28/20 DL culture>>rare staph aureus; final  Plan/Recommendations:  1. Call VAD Coordinator if any VAD equipment or drive line issues. 2. Daily dressing changes per BS nurse.  Alyce Pagan RN VAD Coordinator  Office: [phone]  24/7 Pager: [phone]

## 2020-05-02 NOTE — Progress Notes (Addendum)
Patient ID: Jon Martin, male   DOB: [birth year], 35 y.o.   MRN: [id number]   Advanced Heart Failure VAD Team Note  PCP-Cardiologist: No primary care provider on file.   Subjective:     HM3 LVAD admitted w/ Drive-line infection.  - Wound culture with S aureus. Blood cx NGTD - 3/26 abx narrowed to cefazolin 2gm IV Q 8hr. ID following  -3/28 CT abd/pelvis soft tissue no deep infection. Oral Surgeon consulted.   INR 1.1. Hgb stable at 10. LDH 196  Feeling ok. Denies SOB.   LVAD INTERROGATION:  HeartMate 3 LVAD:   Flow 3.8 liters/min, speed 5400, power 4,  PI 4       Objective:    Vital Signs:   Temp:  [97.6 F (36.4 C)-98.4 F (36.9 C)] 98 F (36.7 C) (03/29 0759) Pulse Rate:  [61-75] 72 (03/29 0759) Resp:  [16-18] 17 (03/29 0759) BP: (93-138)/(69-117) 138/117 (03/29 0759) SpO2:  [97 %-100 %] 97 % (03/29 0759) Weight:  [54.8 kg] 54.8 kg (03/29 0500) Last BM Date: 04/29/20 Mean arterial Pressure 70-80s   Intake/Output:   Intake/Output Summary (Last 24 hours) at 05/02/2020 1044 Last data filed at 05/02/2020 0900 Gross per 24 hour  Intake 1532.33 ml  Output 1400 ml  Net 132.33 ml     Physical Exam    Physical Exam: GENERAL: No acute distress. HEENT: normal  NECK: Supple, JVP flat .  2+ bilaterally, no bruits.  No lymphadenopathy or thyromegaly appreciated.   CARDIAC:  Mechanical heart sounds with LVAD hum present.  LUNGS:  Clear to auscultation bilaterally.  ABDOMEN:  Soft, round, nontender, positive bowel sounds x4.     LVAD exit site: Dressing dry and intact.  No erythema or drainage.  Stabilization device present and accurately applied.  Driveline dressing is being changed daily per sterile technique. EXTREMITIES:  Warm and dry, no cyanosis, clubbing, rash or edema  NEUROLOGIC:  Alert and oriented x 3.    No aphasia.  No dysarthria.  Affect pleasant.         Telemetry  NSR 80-90s personally reviewed.   Labs   Basic Metabolic Panel: Recent Labs  Lab  04/28/20 0955 04/29/20 0042 04/30/20 0308 05/01/20 0034 05/02/20 0047  NA 133* 133* 135 135 136  K 3.6 3.3* 4.0 3.5 3.6  CL 100 102 104 104 104  CO2 27 24 24 25 26   GLUCOSE 135* 109* 105* 103* 117*  BUN 9 6 5* 5* 5*  CREATININE 0.84 0.76 0.74 0.91 0.72  CALCIUM 9.3 8.5* 9.3 8.9 8.6*  MG  --   --   --  1.2* 2.1    Liver Function Tests: No results for input(s): AST, ALT, ALKPHOS, BILITOT, PROT, ALBUMIN in the last 168 hours. No results for input(s): LIPASE, AMYLASE in the last 168 hours. No results for input(s): AMMONIA in the last 168 hours.  CBC: Recent Labs  Lab 04/28/20 0955 04/29/20 0042 04/30/20 0308 05/01/20 0034 05/02/20 0047  WBC 6.5 6.0 6.2 7.2 7.3  HGB 7.7* 6.1* 10.5* 10.0* 10.0*  HCT 23.9* 18.8* 31.5* 31.2* 30.0*  MCV 94.5 96.4 95.2 97.2 96.2  PLT 111* 101* 126* 146* 185    INR: Recent Labs  Lab 04/28/20 0955 04/29/20 0042 04/30/20 0308 05/01/20 0034 05/02/20 0047  INR 2.3* 1.7* 1.5* 1.2 1.1    Other results:  EKG:    Imaging   CT ABDOMEN PELVIS WO CONTRAST  Result Date: 05/01/2020 CLINICAL DATA:  Pt has redness and swelling around  his driveline insertion site. Concern for driveline infection. Pt has an LVAD EXAM: CT ABDOMEN AND PELVIS WITHOUT CONTRAST TECHNIQUE: Multidetector CT imaging of the abdomen and pelvis was performed following the standard protocol without IV contrast. COMPARISON:  None. FINDINGS: Lower chest: No acute abnormality. Hepatobiliary: No focal liver abnormality. No gallstones, gallbladder wall thickening, or pericholecystic fluid. No biliary dilatation. Pancreas: No focal lesion. Normal pancreatic contour. No surrounding inflammatory changes. No main pancreatic ductal dilatation. Spleen: Normal in size without focal abnormality. Adrenals/Urinary Tract: No adrenal nodule bilaterally. No nephrolithiasis, no hydronephrosis, and no contour-deforming renal mass. No ureterolithiasis or hydroureter. The urinary bladder is unremarkable.  Stomach/Bowel: Stomach is within normal limits. No evidence of bowel wall thickening or dilatation. Appendix appears normal. Vascular/Lymphatic: No abdominal aorta or iliac aneurysm.No abdominal, pelvic, or inguinal lymphadenopathy. Reproductive: Prostate is unremarkable. Other: No intraperitoneal free fluid. No intraperitoneal free gas. No organized fluid collection. Musculoskeletal: Area, less than 3 cm, of subcutaneus soft tissue edema surrounding the driveline insertion site along the left anterior abdomen with no associated organized fluid collection. IMPRESSION: 1. Subcutaneus soft tissue edema surrounding driveline insertion site along the left anterior abdomen with no associated organized fluid collection. Finding could represent an area of infection versus scarring. Please note markedly limited evaluation due to noncontrast study and streak artifact originating from the line insertion. Recommend clinical correlation. 2. Otherwise no acute intra-abdominal or intrapelvic abnormality on this noncontrast study. Electronically Signed   By: Tish Frederickson M.D.   On: 05/01/2020 19:22     Medications:     Scheduled Medications: . pantoprazole  40 mg Oral Daily  . sodium chloride flush  3 mL Intravenous Q12H  . Warfarin - Pharmacist Dosing Inpatient   Does not apply q1600    Infusions: . sodium chloride    . sodium chloride 20 mL/hr at 04/29/20 1646  .  ceFAZolin (ANCEF) IV 2 g (05/02/20 0931)  . 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 -  Admitted for IV abx vancomycin/cefepime/Flagyl - Driveline culture with S aureus. Blood cx NGTD  - 3/27 abx narrowed to cefazolin 2gm IV Q 8hr, per ID -  CT abd/pelvis- soft tissue infection.  - ID apprecatied   2.  Chronic Systolic HF due to NICM - Echo EF 10% with biventricular failure - HM-3 VAD implant 02/26/19 - Echo 7/21 EF 40-45% - No bb or ARB with previous dehydration/RV failure.  - VAD interrogated personally. Parameters stable.  - INR 1.1. LDL 196 - Continue  IV heparin + warfarin.   3. Anemia - Baseline hgb has been upper 9's, admission hgb 7.7 => 6.1.  However, hgb up to 10.5 after 2 units PRCs and stool was heme negative.  Does not appear to be actively GI bleeding, ?initial drop dilutional with IVF at admission.   - Hgb 10. Stable.   4. F/E/N - We continue to worry about his nutritional status after his mother died.  5. Broken Teeth -Dental Team consulted - Panorex completed today. Plan for extraction pending.   I reviewed the LVAD parameters from today, and compared the results to the patient's prior recorded data.  No programming changes were made.  The LVAD is functioning within  specified parameters.  The patient performs LVAD self-test daily.  LVAD interrogation was negative for any significant power changes, alarms or PI events/speed drops.  LVAD equipment check completed and is in good working order.  Back-up equipment present.   LVAD education done on emergency procedures and precautions and reviewed exit site care.  Length of Stay: 4  [name], NP 05/02/2020, 10:44 AM  VAD Team --- VAD ISSUES ONLY--- Pager [phone] (7am - 7am)  Advanced Heart Failure Team  Pager [phone] (M-F; 7a - 5p)  Please contact [facility] for night-coverage after hours (5p -7a ) and weekends on [email or web address]  Patient seen and examined with the above-signed Advanced Practice Provider and/or Housestaff. I personally reviewed laboratory data, imaging studies and relevant notes. I independently examined the patient and formulated the important aspects of the plan. I have edited the note to reflect any of my changes or salient points.  I have personally discussed the plan with the patient and/or family.  CT C/A/P reviewed. No evidence of deep infection. Remains on IV abx. Afebrile. Seen by Oral Surgery pending Panorex to evaluate broken teeth. Hgb stable on IV heparin. INR 1.1. No CP or SOB  General:  NAD.  HEENT: normal  Neck: supple. JVP not elevated.  Carotids 2+ bilat; no bruits. No lymphadenopathy or thryomegaly appreciated. Cor: LVAD hum.  Lungs: Clear. Abdomen: soft, nontender, non-distended. No hepatosplenomegaly. No bruits or masses. Good bowel sounds. Driveline site clean. Anchor in place.  Extremities: no cyanosis, clubbing, rash. Warm no edema  Neuro: alert & oriented x 3. No focal deficits. Moves all 4 without problem   CT ok without evidence of deep infection (Personally reviewed). Continue IV abx. Ideally would get tetth extracted while INR down Await orthopantogram. Continue heparin/warfarin. Discussed dosing with PharmD personally.HF stable. VAD interrogated personally. Parameters stable.  Arvilla Meres, MD  3:05 PM

## 2020-05-02 NOTE — Progress Notes (Signed)
ANTICOAGULATION CONSULT NOTE  Pharmacy Consult for warfarin + heparin Indication: LVAD  No Known Allergies  Patient Measurements: Weight: 54.8 kg (120 lb 13 oz)   Vital Signs: Temp: 98 F (36.7 C) (03/29 0759) Temp Source: Oral (03/29 0759) BP: 138/117 (03/29 0759) Pulse Rate: 72 (03/29 0759)  Labs: Recent Labs    04/30/20 0308 05/01/20 0034 05/01/20 1751 05/02/20 0047  HGB 10.5* 10.0*  --  10.0*  HCT 31.5* 31.2*  --  30.0*  PLT 126* 146*  --  185  LABPROT 17.2* 14.8  --  13.5  INR 1.5* 1.2  --  1.1  HEPARINUNFRC  --   --  <0.10* <0.10*  CREATININE 0.74 0.91  --  0.72    Estimated Creatinine Clearance: 100.8 mL/min (by C-G formula based on SCr of 0.72 mg/dL).   Medical History: Past Medical History:  Diagnosis Date  . Dilated cardiomyopathy (HCC)   . Dilated cardiomyopathy (HCC) 02/2019  . 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 PTA regimen is 2.5 mg daily.   INR down to 1.1 today. Patient reports that bleeding in his mouth has resolved. No other overt bleeding noted. Hgb has stabilized to 10s, pltc wnl. Holding off on enteroscopy. Patient is likely eating more while inpatient which explains higher dose requirements. However, in anticipation of possible dental procedures in the next few days, will give lower warfarin dose tonight.  Low, fixed-dose heparin gtt started yesterday and HL remains undetectable.     Goal of Therapy:  Heparin level <0.3 INR 2-2.5 Monitor platelets by anticoagulation protocol: Yes   Plan:  Continue IV heparin 500 units/hr at fixed rate Warfarin 2.5 mg PO x1 tonight Daily INR, HL, s/sx bleeding  Tama Headings, PharmD, BCPS PGY2 Cardiology Pharmacy Resident Phone: [phone] 05/02/2020  8:57 AM  Please check [email or web address] for unit-specific pharmacy phone numbers.

## 2020-05-02 NOTE — Progress Notes (Signed)
[facility]  Date of Admission:  04/28/2020      Total days of antibiotics 5  Cefazolin 3/27 >> C  Vanc/Cefepime/Metronidazole 3/25 >> 3/28          ASSESSMENT: Jon Martin is a 35 y.o. male with LVAD here for acute onset cellulitis and purulent drainage shortly following trauma to driveline. We were able to talk with Jon Martin during the dressing change and his site has no further expressive purulent material today. Induration improving without erythema. CT reviewed and no formed abscess/phlegmon only diffusely swollen skin around the driveline c/w abdominal wall cellulitis. Would continue with IV cefazolin during hospitalization and then give one dose of long acting Oritavancin nearing discharge (takes 3 hours to infuse) - this will provide staph/strep coverage for him to continue treatment for ~3 weeks duration and sparing him of PICC line.   Teeth do not appear to have acute infection but broken and need removal, uncertain if this will happen inpatient or not.     PLAN: 1. Continue cefazolin  2. Planning one dose of IV oritiavancin prior to discharge (no PICC needed)  3. Follow dentistry assessment / plan    Principal Problem:   Infection associated with driveline of left ventricular assist device (LVAD) (HCC) Active Problems:   Chronic systolic heart failure (HCC)   Left ventricular assist device (LVAD) complication   Complication involving left ventricular assist device (LVAD)   Dental caries   Retained dental root   Chronic periodontitis   . pantoprazole  40 mg Oral Daily  . sodium chloride flush  3 mL Intravenous Q12H  . Warfarin - Pharmacist Dosing Inpatient   Does not apply q1600    SUBJECTIVE: Doing well today. In with Jon Martin, VAD coordinator to do dressing change.   Review of Systems: Review of Systems  Constitutional: Negative for chills, fever, malaise/fatigue and weight loss.  HENT: Negative for sore throat.         No pain in mouth, only some bleeding.   Respiratory: Negative for cough and sputum production.   Cardiovascular: Negative for chest pain and leg swelling.  Gastrointestinal: Negative for abdominal pain, diarrhea and vomiting.  Genitourinary: Negative for dysuria and flank pain.  Musculoskeletal: Negative for joint pain, myalgias and neck pain.  Skin: Negative for rash.  Neurological: Negative for dizziness, tingling and headaches.  Psychiatric/Behavioral: Negative for depression and substance abuse. The patient is not nervous/anxious and does not have insomnia.     No Known Allergies   OBJECTIVE: Vitals:   05/02/20 0315 05/02/20 0500 05/02/20 0730 05/02/20 0759  BP: 93/77   (!) 138/117  Pulse: 62  75 72  Resp: 16  17 17   Temp: 98.2 F (36.8 C)   98 F (36.7 C)  TempSrc: Oral   Oral  SpO2: 100%  97% 97%  Weight:  54.8 kg     Body mass index is 20.1 kg/m.  Physical Exam HENT:     Mouth/Throat:     Mouth: No oral lesions.     Dentition: Normal dentition. No dental caries.     Comments: Multiple broken molars down to gumline. No erythema, drainage or signs of infection.  Eyes:     General: No scleral icterus. Cardiovascular:     Rate and Rhythm: Normal rate.     Heart sounds: Normal heart sounds.     Comments: +LVAD humm Pulmonary:     Effort: Pulmonary effort is normal.  Breath sounds: Normal breath sounds.  Abdominal:     General: There is no distension.     Palpations: Abdomen is soft.     Tenderness: There is no abdominal tenderness.     Comments: Site with only slight induration at the 9-10 o'clock position. No purulent drainage noted today, only scant on the alginate dressing.   Lymphadenopathy:     Cervical: No cervical adenopathy.  Skin:    General: Skin is warm and dry.     Findings: No rash.  Neurological:     Mental Status: He is alert and oriented to person, place, and time.     Lab Results Lab Results  Component Value Date   WBC 7.3  05/02/2020   HGB 10.0 (L) 05/02/2020   HCT 30.0 (L) 05/02/2020   MCV 96.2 05/02/2020   PLT 185 05/02/2020    Lab Results  Component Value Date   CREATININE 0.72 05/02/2020   BUN 5 (L) 05/02/2020   NA 136 05/02/2020   K 3.6 05/02/2020   CL 104 05/02/2020   CO2 26 05/02/2020    Lab Results  Component Value Date   ALT 32 03/29/2020   AST 107 (H) 03/29/2020   ALKPHOS 76 03/29/2020   BILITOT 0.5 03/29/2020     Microbiology: Recent Results (from the past 240 hour(s))  Aerobic Culture w Gram Stain (superficial specimen)     Status: None   Collection Time: 04/28/20 11:31 AM   Specimen: Wound  Result Value Ref Range Status   Specimen Description WOUND ABD  Final   Special Requests NONE  Final   Gram Stain   Final    RARE WBC PRESENT, PREDOMINANTLY MONONUCLEAR RARE GRAM POSITIVE RODS Performed at [facility], [address] [place], [place] [address]    Culture RARE STAPHYLOCOCCUS AUREUS  Final   Report Status 04/30/2020 FINAL  Final   Organism ID, Bacteria STAPHYLOCOCCUS AUREUS  Final      Susceptibility   Staphylococcus aureus - MIC*    CIPROFLOXACIN 1 SENSITIVE Sensitive     ERYTHROMYCIN >=8 RESISTANT Resistant     GENTAMICIN <=0.5 SENSITIVE Sensitive     OXACILLIN <=0.25 SENSITIVE Sensitive     TETRACYCLINE <=1 SENSITIVE Sensitive     VANCOMYCIN <=0.5 SENSITIVE Sensitive     TRIMETH/SULFA <=10 SENSITIVE Sensitive     CLINDAMYCIN RESISTANT Resistant     RIFAMPIN <=0.5 SENSITIVE Sensitive     Inducible Clindamycin POSITIVE Resistant     * RARE STAPHYLOCOCCUS AUREUS  Culture, blood (Routine X 2) w Reflex to ID Panel     Status: None (Preliminary result)   Collection Time: 04/28/20  1:03 PM   Specimen: BLOOD  Result Value Ref Range Status   Specimen Description BLOOD SITE NOT SPECIFIED  Final   Special Requests   Final    BOTTLES DRAWN AEROBIC AND ANAEROBIC Blood Culture results may not be optimal due to an excessive volume of blood received in culture bottles    Culture   Final    NO GROWTH 4 DAYS Performed at [facility], [address] [place], [place] [address]    Report Status PENDING  Incomplete  SARS CORONAVIRUS 2 (TAT 6-24 HRS) Nasopharyngeal     Status: None   Collection Time: 04/28/20  3:26 PM   Specimen: Nasopharyngeal  Result Value Ref Range Status   SARS Coronavirus 2 NEGATIVE NEGATIVE Final    Comment: (NOTE) SARS-CoV-2 target nucleic acids are NOT DETECTED.  The  SARS-CoV-2 RNA is generally detectable in upper and lower respiratory specimens during the acute phase of infection. Negative results do not preclude SARS-CoV-2 infection, do not rule out co-infections with other pathogens, and should not be used as the sole basis for treatment or other patient management decisions. Negative results must be combined with clinical observations, patient history, and epidemiological information. The expected result is Negative.  Fact Sheet for Patients: HairSlick.no  Fact Sheet for Healthcare Providers: quierodirigir.com  This test is not yet approved or cleared by the [place] FDA and  has been authorized for detection and/or diagnosis of SARS-CoV-2 by FDA under an Emergency Use Authorization (EUA). This EUA will remain  in effect (meaning this test can be used) for the duration of the COVID-19 declaration under Se ction 564(b)(1) of the Act, 21 U.S.C. section 360bbb-3(b)(1), unless the authorization is terminated or revoked sooner.  Performed at [facility], [address], [place], [place] [address]   MRSA PCR Screening     Status: None   Collection Time: 04/28/20 10:59 PM   Specimen: Nasopharyngeal  Result Value Ref Range Status   MRSA by PCR NEGATIVE NEGATIVE Final    Comment:        The GeneXpert MRSA Assay (FDA approved for NASAL specimens only), is one component of a comprehensive MRSA colonization surveillance program. It is not intended  to diagnose MRSA infection nor to guide or monitor treatment for MRSA infections. Performed at [facility], [address] [place], [place] [address]     Rexene Alberts, MSN, NP-C [facility]  [email or web address] Pager: [phone] Office: [phone] [facility] Main Line: [phone]

## 2020-05-03 DIAGNOSIS — Z95811 Presence of heart assist device: Secondary | ICD-10-CM | POA: Diagnosis not present

## 2020-05-03 DIAGNOSIS — I5022 Chronic systolic (congestive) heart failure: Secondary | ICD-10-CM | POA: Diagnosis not present

## 2020-05-03 DIAGNOSIS — T827XXA Infection and inflammatory reaction due to other cardiac and vascular devices, implants and grafts, initial encounter: Secondary | ICD-10-CM | POA: Diagnosis not present

## 2020-05-03 LAB — BASIC METABOLIC PANEL
Anion gap: 7 (ref 5–15)
BUN: <5 mg/dL — ABNORMAL LOW (ref 6–20)
CO2: 24 mmol/L (ref 22–32)
Calcium: 8.9 mg/dL (ref 8.9–10.3)
Chloride: 105 mmol/L (ref 98–111)
Creatinine, Ser: 0.7 mg/dL (ref 0.61–1.24)
GFR, Estimated: >60 mL/min (ref >60)
Glucose, Bld: 111 mg/dL — ABNORMAL HIGH (ref 70–99)
Potassium: 3.4 mmol/L — ABNORMAL LOW (ref 3.5–5.1)
Sodium: 136 mmol/L (ref 135–145)

## 2020-05-03 LAB — CBC
HCT: 28.6 % — ABNORMAL LOW (ref 39.0–52.0)
Hemoglobin: 9.4 g/dL — ABNORMAL LOW (ref 13.0–17.0)
MCH: 31.9 pg (ref 26.0–34.0)
MCHC: 32.9 g/dL (ref 30.0–36.0)
MCV: 96.9 fL (ref 80.0–100.0)
Platelets: 221 10*3/uL (ref 150–400)
RBC: 2.95 MIL/uL — ABNORMAL LOW (ref 4.22–5.81)
RDW: 21.3 % — ABNORMAL HIGH (ref 11.5–15.5)
WBC: 8.3 10*3/uL (ref 4.0–10.5)
nRBC: 0 % (ref 0.0–0.2)

## 2020-05-03 LAB — CULTURE, BLOOD (ROUTINE X 2): Culture: NO GROWTH

## 2020-05-03 LAB — LACTATE DEHYDROGENASE: LDH: 193 U/L — ABNORMAL HIGH (ref 98–192)

## 2020-05-03 LAB — MAGNESIUM: Magnesium: 1.7 mg/dL (ref 1.7–2.4)

## 2020-05-03 LAB — HEPARIN LEVEL (UNFRACTIONATED): Heparin Unfractionated: <0.1 IU/mL — ABNORMAL LOW (ref 0.30–0.70)

## 2020-05-03 LAB — PROTIME-INR
INR: 1.1 (ref 0.8–1.2)
Prothrombin Time: 13.6 seconds (ref 11.4–15.2)

## 2020-05-03 MED ORDER — POTASSIUM CHLORIDE CRYS ER 20 MEQ PO TBCR
40.0000 meq | EXTENDED_RELEASE_TABLET | Freq: Four times a day (QID) | ORAL | Status: AC
  Administered 2020-05-03 (×2): 40 meq via ORAL
  Filled 2020-05-03 (×2): qty 2

## 2020-05-03 MED ORDER — MAGNESIUM SULFATE 2 GM/50ML IV SOLN
2.0000 g | Freq: Once | INTRAVENOUS | Status: AC
  Administered 2020-05-03: 2 g via INTRAVENOUS
  Filled 2020-05-03: qty 50

## 2020-05-03 NOTE — Progress Notes (Addendum)
Patient ID: Jon Martin, male   DOB: 03/29/1985, 34 y.o.   MRN: [id number]   Advanced Heart Failure VAD Team Note  PCP-Cardiologist: No primary care provider on file.   Subjective:     HM3 LVAD admitted w/ Drive-line infection.  - Wound culture with S aureus. Blood cx NGTD - 3/26 abx narrowed to cefazolin 2gm IV Q 8hr. ID following  -3/28 CT abd/pelvis soft tissue no deep infection. Oral Surgeon consulted.   INR 1.1. Hgb 9.4.  LDH 193  No complaints.   LVAD INTERROGATION:  HeartMate 3 LVAD:   Flow 4.1  liters/min, speed 5400, power 4,  PI 3.9      Objective:    Vital Signs:   Temp:  [97.7 F (36.5 C)-98.9 F (37.2 C)] 97.7 F (36.5 C) (03/30 0715) Pulse Rate:  [73-86] 77 (03/30 0300) Resp:  [16-17] 16 (03/30 0300) BP: (101-111)/(79-88) 101/85 (03/30 0320) SpO2:  [95 %-100 %] 99 % (03/30 0300) Weight:  [55.1 kg] 55.1 kg (03/30 0300) Last BM Date: 04/29/20 Mean arterial Pressure 80-90s   Intake/Output:   Intake/Output Summary (Last 24 hours) at 05/03/2020 1033 Last data filed at 05/03/2020 0500 Gross per 24 hour  Intake 566.68 ml  Output 1275 ml  Net -708.32 ml     Physical Exam    Physical Exam: GENERAL: No acute distress. HEENT: normal  NECK: Supple, JVP flat   2+ bilaterally, no bruits.  No lymphadenopathy or thyromegaly appreciated.   CARDIAC:  Mechanical heart sounds with LVAD hum present.  LUNGS:  Clear to auscultation bilaterally.  ABDOMEN:  Soft, round, nontender, positive bowel sounds x4.     LVAD exit site: well-healed and incorporated.  Dressing dry and intact.  No erythema or drainage.  Stabilization device present and accurately applied.  Driveline dressing is being changed daily per sterile technique. EXTREMITIES:  Warm and dry, no cyanosis, clubbing, rash or edema  NEUROLOGIC:  Alert and oriented x 3.    No aphasia.  No dysarthria.  Affect pleasant.        Telemetry  NSR 80-90s personally reviewed.   Labs   Basic Metabolic Panel: Recent  Labs  Lab 04/29/20 0042 04/30/20 0308 05/01/20 0034 05/02/20 0047 05/03/20 0159  NA 133* 135 135 136 136  K 3.3* 4.0 3.5 3.6 3.4*  CL 102 104 104 104 105  CO2 24 24 25 26 24  GLUCOSE 109* 105* 103* 117* 111*  BUN 6 5* 5* 5* <5*  CREATININE 0.76 0.74 0.91 0.72 0.70  CALCIUM 8.5* 9.3 8.9 8.6* 8.9  MG  --   --  1.2* 2.1 1.7    Liver Function Tests: No results for input(s): AST, ALT, ALKPHOS, BILITOT, PROT, ALBUMIN in the last 168 hours. No results for input(s): LIPASE, AMYLASE in the last 168 hours. No results for input(s): AMMONIA in the last 168 hours.  CBC: Recent Labs  Lab 04/29/20 0042 04/30/20 0308 05/01/20 0034 05/02/20 0047 05/03/20 0159  WBC 6.0 6.2 7.2 7.3 8.3  HGB 6.1* 10.5* 10.0* 10.0* 9.4*  HCT 18.8* 31.5* 31.2* 30.0* 28.6*  MCV 96.4 95.2 97.2 96.2 96.9  PLT 101* 126* 146* 185 221    INR: Recent Labs  Lab 04/29/20 0042 04/30/20 0308 05/01/20 0034 05/02/20 0047 05/03/20 0159  INR 1.7* 1.5* 1.2 1.1 1.1    Other results:  EKG:    Imaging   CT ABDOMEN PELVIS WO CONTRAST  Result Date: 05/01/2020 CLINICAL DATA:  Pt has redness and swelling around his driveline   insertion site. Concern for driveline infection. Pt has an LVAD EXAM: CT ABDOMEN AND PELVIS WITHOUT CONTRAST TECHNIQUE: Multidetector CT imaging of the abdomen and pelvis was performed following the standard protocol without IV contrast. COMPARISON:  None. FINDINGS: Lower chest: No acute abnormality. Hepatobiliary: No focal liver abnormality. No gallstones, gallbladder wall thickening, or pericholecystic fluid. No biliary dilatation. Pancreas: No focal lesion. Normal pancreatic contour. No surrounding inflammatory changes. No main pancreatic ductal dilatation. Spleen: Normal in size without focal abnormality. Adrenals/Urinary Tract: No adrenal nodule bilaterally. No nephrolithiasis, no hydronephrosis, and no contour-deforming renal mass. No ureterolithiasis or hydroureter. The urinary bladder is  unremarkable. Stomach/Bowel: Stomach is within normal limits. No evidence of bowel wall thickening or dilatation. Appendix appears normal. Vascular/Lymphatic: No abdominal aorta or iliac aneurysm.No abdominal, pelvic, or inguinal lymphadenopathy. Reproductive: Prostate is unremarkable. Other: No intraperitoneal free fluid. No intraperitoneal free gas. No organized fluid collection. Musculoskeletal: Area, less than 3 cm, of subcutaneus soft tissue edema surrounding the driveline insertion site along the left anterior abdomen with no associated organized fluid collection. IMPRESSION: 1. Subcutaneus soft tissue edema surrounding driveline insertion site along the left anterior abdomen with no associated organized fluid collection. Finding could represent an area of infection versus scarring. Please note markedly limited evaluation due to noncontrast study and streak artifact originating from the line insertion. Recommend clinical correlation. 2. Otherwise no acute intra-abdominal or intrapelvic abnormality on this noncontrast study. Electronically Signed   By: Tish Frederickson M.D.   On: 05/01/2020 19:22   DG Orthopantogram  Result Date: 05/02/2020 CLINICAL DATA:  LEFT upper tooth ache, broken tooth, bleeding, question infection, history LVAD EXAM: ORTHOPANTOGRAM/PANORAMIC COMPARISON:  02/12/2019 FINDINGS: Fracture and periodontal lucency again identified at a LEFT maxillary molar, upper LEFT quadrant Q2 #7. No additional periodontal disease changes or apical abscess identified. No additional does for fractures. Mandible otherwise intact. IMPRESSION: Fracture and periodontoidal lucency of a LEFT maxillary molar, Q2 #7. Electronically Signed   By: Ulyses Southward M.D.   On: 05/02/2020 12:52     Medications:     Scheduled Medications: . pantoprazole  40 mg Oral Daily  . potassium chloride  40 mEq Oral Q6H  . sodium chloride flush  3 mL Intravenous Q12H  . Warfarin - Pharmacist Dosing Inpatient   Does not apply  q1600    Infusions: . sodium chloride    . sodium chloride 20 mL/hr at 04/29/20 1646  .  ceFAZolin (ANCEF) IV 2 g (05/03/20 0908)  . heparin 500 Units/hr (05/03/20 0910)    PRN Medications: sodium chloride, acetaminophen, ondansetron (ZOFRAN) IV   Patient Profile   35 y/o male w/ chronic systolic HF s/p HM3 LVAD admitted w/ staph aureus drive-line infection  Assessment/Plan:    1. 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 -  Admitted for IV abx vancomycin/cefepime/Flagyl - Driveline culture with S aureus. Blood cx NGTD  - 3/27 abx narrowed to cefazolin 2gm IV Q 8hr, then one dose of IV oritiavancin prior to d/c  - CT abd/pelvis- soft tissue infection.  - Thank you! ID Team.   2.  Chronic Systolic HF due to NICM - Echo EF 10% with biventricular failure - HM-3 VAD implant 02/26/19 - Echo 7/21 EF 40-45% - Volume status stable.  - No bb or ARB with previous dehydration/RV failure.  - VAD interrogated personally. Parameters stable.  - INR 1.1. LDL 193  - Continue  IV heparin + warfarin.  - Discussed with pharmacy team.  3. Anemia - Baseline hgb has been upper 9's, admission hgb 7.7 => 6.1.  However, hgb up to 10.5 after 2 units PRCs and stool was heme negative.  Does not appear to be actively GI bleeding, ?initial drop dilutional with IVF at admission.   - Hgb 9.4 today.    4. F/E/N - We continue to worry about his nutritional status after his mother died.  5. Broken Teeth -Dental Team consulted - Panorex completed today. 1 broken tooth.  - I left message for Dental Team. Per Dr Gala Romney he would favor extraction this admit while INR is low.   Ambulate today.    I reviewed the LVAD parameters from today, and compared the results to the patient's prior recorded data.  No programming changes were made.  The LVAD is functioning within specified parameters.  The patient performs LVAD self-test daily.  LVAD interrogation was  negative for any significant power changes, alarms or PI events/speed drops.  LVAD equipment check completed and is in good working order.  Back-up equipment present.   LVAD education done on emergency procedures and precautions and reviewed exit site care.  Length of Stay: 5  [name], NP 05/03/2020, 10:33 AM  VAD Team --- VAD ISSUES ONLY--- Pager [phone] (7am - 7am)  Advanced Heart Failure Team  Pager [phone] (M-F; 7a - 5p)  Please contact [facility] for night-coverage after hours (5p -7a ) and weekends on [email or web address]  Patient seen and examined with the above-signed Advanced Practice Provider and/or Housestaff. I personally reviewed laboratory data, imaging studies and relevant notes. I independently examined the patient and formulated the important aspects of the plan. I have edited the note to reflect any of my changes or salient points. I have personally discussed the plan with the patient and/or family.  Remains on cefazolin. No fevers or chills. Volume status stable. INR 1.1 on heparin and warfarin.   General:  NAD.  HEENT: normal  Neck: supple. JVP not elevated.  Carotids 2+ bilat; no bruits. No lymphadenopathy or thryomegaly appreciated. Cor: LVAD hum.  Lungs: Clear. Abdomen:  soft, nontender, non-distended. No hepatosplenomegaly. No bruits or masses. Good bowel sounds. Driveline site clean. Anchor in place.  Extremities: no cyanosis, clubbing, rash. Warm no edema  Neuro: alert & oriented x 3. No focal deficits. Moves all 4 without problem   Continue cefazolin. Can give dose of oritavancin prior to d/c. Plan for dental extraction tomorrow. Continue heparin/warfarin. Discussed dosing with PharmD personally.  Arvilla Meres, MD  1:46 PM

## 2020-05-03 NOTE — Progress Notes (Addendum)
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   HM III LVAD implanted on 02/26/19 by Dr. Maren Beach under Destination Therapy criteria.  Patient sitting up in bed in watching TV this morning. Denies pain. Pt asking when he can go home. Discussed need for IV antibiotics for drive line infection, need for frequent drive line dressing changes, need for dental extraction(s), and need for higher INR prior to discharge. He verbalized understanding of the above. He is looking forward "to my teeth being taken care of."   Dr Chales Salmon in to see patient this morning. Plan for extraction tomorrow @ 10:00.   Vital signs: Temp: 98.7 HR: 93 Doppler Pressure: 86 Automatic BP: 101/85 (92) O2 Sat: 99% RA Wt: 122.8>123>124.1>120.8>121.4 lbs   LVAD interrogation reveals:  Speed: 5400 Flow: 3.7  Power: 4.0w PI: 4.7  Hct: 20 - do not change  Alarms: none Events: rare   Fixed speed: 5400 Low speed limit: 5100  Drive Line:  Pt's anchor is dangling of his side this morning. Existing VAD dressing removed and site care performed using sterile technique. Drive line exit site cleaned with Chlora prep applicators x 2, allowed to dry.  Cleansed with sterile saline wipe x 2, allowed to dry and gauze dressing with silver strip re-applied. Silk tape used to adhere dressing. Exit site incorporated.The velour is fully implanted at exit site. Scant amount of yellow/brown drainage noted on silver strip and gauze with no foul odor or tenderness noted. Area of induration noted tracking across abdomen smaller. Redness improved, but swelling still present. Drive line anchor replaced. May advance to every other day dressing changes using silk tape per BS nurse. Next dressing change due 05/05/20.    Labs:  LDH trend: 181>171>185>196>193  INR trend: 2.3>1.7>1.2>1.1>1.1  Anticoagulation Plan: -INR Goal: 2.0 - 2.5 -ASA Dose: none  Blood Products:  -  04/29/20>>2 units PCs  Device: N/A  Infection:  - 04/28/20 BCs>> No growth x 3 days - 04/28/20 DL culture>>rare staph aureus; final  Drips:  Heparin @ 500 units/hr  Plan/Recommendations:  1. Call VAD Coordinator if any VAD equipment or drive line issues. 2. Daily dressing changes per BS nurse. 3. VAD coordinator to accompany patient to OR tomorrow morning for dental extraction.   Alyce Pagan RN VAD Coordinator  Office: [phone]  24/7 Pager: [phone]

## 2020-05-03 NOTE — Progress Notes (Signed)
[facility]  Date of Admission:  04/28/2020      Total days of antibiotics 6  Cefazolin 3/27 >> C  Vanc/Cefepime/Metronidazole 3/25 >> 3/28          ASSESSMENT: Jon Martin is a 35 y.o. male with LVAD here for acute onset cellulitis and purulent drainage shortly following trauma to driveline. Blood cultures negative. No systemic signs of infection. CT with only diffuse stranding/unorgainzed swelling around exit site. Overall much improved.   Teeth do not appear to have acute infection but broken and need removal, uncertain if this will happen inpatient or not. INRs low so would be a good time now to prevent another hospitalization in the future for warfarin hold/heparin bridge. No need for additional coverage or prophylaxis for procedure on cefazolin.   If no plans for dental extraction this admission we can plan a dose of oritavancin tomorrow to give 3 weeks of coverage.     PLAN: 1. Continue cefazolin  2. Planning one dose of IV oritiavancin tomorrow if not plans for extractions.     Principal Problem:   Infection associated with driveline of left ventricular assist device (LVAD) (HCC) Active Problems:   Chronic systolic heart failure (HCC)   Left ventricular assist device (LVAD) complication   Complication involving left ventricular assist device (LVAD)   Dental caries   Retained dental root   Chronic periodontitis   . pantoprazole  40 mg Oral Daily  . potassium chloride  40 mEq Oral Q6H  . sodium chloride flush  3 mL Intravenous Q12H  . Warfarin - Pharmacist Dosing Inpatient   Does not apply q1600    SUBJECTIVE: Doing well today.  Did not sleep well last night. Not sure what the plan is for teeth yet.     Review of Systems: Review of Systems  Constitutional: Negative for chills, fever, malaise/fatigue and weight loss.  HENT: Negative for sore throat.        No pain in mouth, only some bleeding.   Respiratory: Negative  for cough and sputum production.   Cardiovascular: Negative for chest pain and leg swelling.  Gastrointestinal: Negative for abdominal pain, diarrhea and vomiting.  Genitourinary: Negative for dysuria and flank pain.  Musculoskeletal: Negative for joint pain, myalgias and neck pain.  Skin: Negative for rash.  Neurological: Negative for dizziness, tingling and headaches.  Psychiatric/Behavioral: Negative for depression and substance abuse. The patient is not nervous/anxious and does not have insomnia.     No Known Allergies   OBJECTIVE: Vitals:   05/02/20 2326 05/03/20 0300 05/03/20 0320 05/03/20 0715  BP: 111/88 101/85 101/85   Pulse:  77    Resp:  16    Temp:  98.4 F (36.9 C)  97.7 F (36.5 C)  TempSrc:  Oral  Oral  SpO2:  99%    Weight:  55.1 kg     Body mass index is 20.21 kg/m.  Physical Exam HENT:     Mouth/Throat:     Mouth: No oral lesions.     Dentition: Normal dentition. No dental caries.     Comments: Multiple broken molars down to gumline. No erythema, drainage or signs of infection.  Eyes:     General: No scleral icterus. Cardiovascular:     Rate and Rhythm: Normal rate.     Heart sounds: Normal heart sounds.     Comments: +LVAD humm Pulmonary:     Effort: Pulmonary effort is normal.  Breath sounds: Normal breath sounds.  Abdominal:     General: There is no distension.     Palpations: Abdomen is soft.     Tenderness: There is no abdominal tenderness.     Comments: Clean and dry gauze dressing.   Lymphadenopathy:     Cervical: No cervical adenopathy.  Skin:    General: Skin is warm and dry.     Findings: No rash.  Neurological:     Mental Status: He is alert and oriented to person, place, and time.     Lab Results Lab Results  Component Value Date   WBC 8.3 05/03/2020   HGB 9.4 (L) 05/03/2020   HCT 28.6 (L) 05/03/2020   MCV 96.9 05/03/2020   PLT 221 05/03/2020    Lab Results  Component Value Date   CREATININE 0.70 05/03/2020   BUN  <5 (L) 05/03/2020   NA 136 05/03/2020   K 3.4 (L) 05/03/2020   CL 105 05/03/2020   CO2 24 05/03/2020    Lab Results  Component Value Date   ALT 32 03/29/2020   AST 107 (H) 03/29/2020   ALKPHOS 76 03/29/2020   BILITOT 0.5 03/29/2020     Microbiology: Recent Results (from the past 240 hour(s))  Aerobic Culture w Gram Stain (superficial specimen)     Status: None   Collection Time: 04/28/20 11:31 AM   Specimen: Wound  Result Value Ref Range Status   Specimen Description WOUND ABD  Final   Special Requests NONE  Final   Gram Stain   Final    RARE WBC PRESENT, PREDOMINANTLY MONONUCLEAR RARE GRAM POSITIVE RODS Performed at [facility], [address] [place], [place] [address]    Culture RARE STAPHYLOCOCCUS AUREUS  Final   Report Status 04/30/2020 FINAL  Final   Organism ID, Bacteria STAPHYLOCOCCUS AUREUS  Final      Susceptibility   Staphylococcus aureus - MIC*    CIPROFLOXACIN 1 SENSITIVE Sensitive     ERYTHROMYCIN >=8 RESISTANT Resistant     GENTAMICIN <=0.5 SENSITIVE Sensitive     OXACILLIN <=0.25 SENSITIVE Sensitive     TETRACYCLINE <=1 SENSITIVE Sensitive     VANCOMYCIN <=0.5 SENSITIVE Sensitive     TRIMETH/SULFA <=10 SENSITIVE Sensitive     CLINDAMYCIN RESISTANT Resistant     RIFAMPIN <=0.5 SENSITIVE Sensitive     Inducible Clindamycin POSITIVE Resistant     * RARE STAPHYLOCOCCUS AUREUS  Culture, blood (Routine X 2) w Reflex to ID Panel     Status: None   Collection Time: 04/28/20  1:03 PM   Specimen: BLOOD  Result Value Ref Range Status   Specimen Description BLOOD SITE NOT SPECIFIED  Final   Special Requests   Final    BOTTLES DRAWN AEROBIC AND ANAEROBIC Blood Culture results may not be optimal due to an excessive volume of blood received in culture bottles   Culture   Final    NO GROWTH 5 DAYS Performed at [facility], [address] [place], [place] [address]    Report Status 05/03/2020 FINAL  Final  SARS CORONAVIRUS 2 (TAT 6-24 HRS)  Nasopharyngeal     Status: None   Collection Time: 04/28/20  3:26 PM   Specimen: Nasopharyngeal  Result Value Ref Range Status   SARS Coronavirus 2 NEGATIVE NEGATIVE Final    Comment: (NOTE) SARS-CoV-2 target nucleic acids are NOT DETECTED.  The SARS-CoV-2 RNA is generally detectable in upper and lower respiratory specimens during the acute phase of  infection. Negative results do not preclude SARS-CoV-2 infection, do not rule out co-infections with other pathogens, and should not be used as the sole basis for treatment or other patient management decisions. Negative results must be combined with clinical observations, patient history, and epidemiological information. The expected result is Negative.  Fact Sheet for Patients: HairSlick.no  Fact Sheet for Healthcare Providers: quierodirigir.com  This test is not yet approved or cleared by the [place] FDA and  has been authorized for detection and/or diagnosis of SARS-CoV-2 by FDA under an Emergency Use Authorization (EUA). This EUA will remain  in effect (meaning this test can be used) for the duration of the COVID-19 declaration under Se ction 564(b)(1) of the Act, 21 U.S.C. section 360bbb-3(b)(1), unless the authorization is terminated or revoked sooner.  Performed at [facility], [address], [place], [place] [address]   MRSA PCR Screening     Status: None   Collection Time: 04/28/20 10:59 PM   Specimen: Nasopharyngeal  Result Value Ref Range Status   MRSA by PCR NEGATIVE NEGATIVE Final    Comment:        The GeneXpert MRSA Assay (FDA approved for NASAL specimens only), is one component of a comprehensive MRSA colonization surveillance program. It is not intended to diagnose MRSA infection nor to guide or monitor treatment for MRSA infections. Performed at [facility], [address] [place], [place] [address]     Rexene Alberts, MSN,  NP-C [facility]  [email or web address] Pager: [phone] Office: [phone] [facility] Main Line: [phone]

## 2020-05-03 NOTE — H&P (View-Only) (Signed)
Patient ID: Jon Martin, male   DOB: 15-Feb-1985, 35 y.o.   MRN: [id number]   Advanced Heart Failure VAD Team Note  PCP-Cardiologist: No primary care provider on file.   Subjective:     HM3 LVAD admitted w/ Drive-line infection.  - Wound culture with S aureus. Blood cx NGTD - 3/26 abx narrowed to cefazolin 2gm IV Q 8hr. ID following  -3/28 CT abd/pelvis soft tissue no deep infection. Oral Surgeon consulted.   INR 1.1. Hgb 9.4.  LDH 193  No complaints.   LVAD INTERROGATION:  HeartMate 3 LVAD:   Flow 4.1  liters/min, speed 5400, power 4,  PI 3.9      Objective:    Vital Signs:   Temp:  [97.7 F (36.5 C)-98.9 F (37.2 C)] 97.7 F (36.5 C) (03/30 0715) Pulse Rate:  [73-86] 77 (03/30 0300) Resp:  [16-17] 16 (03/30 0300) BP: (101-111)/(79-88) 101/85 (03/30 0320) SpO2:  [95 %-100 %] 99 % (03/30 0300) Weight:  [55.1 kg] 55.1 kg (03/30 0300) Last BM Date: 04/29/20 Mean arterial Pressure 80-90s   Intake/Output:   Intake/Output Summary (Last 24 hours) at 05/03/2020 1033 Last data filed at 05/03/2020 0500 Gross per 24 hour  Intake 566.68 ml  Output 1275 ml  Net -708.32 ml     Physical Exam    Physical Exam: GENERAL: No acute distress. HEENT: normal  NECK: Supple, JVP flat   2+ bilaterally, no bruits.  No lymphadenopathy or thyromegaly appreciated.   CARDIAC:  Mechanical heart sounds with LVAD hum present.  LUNGS:  Clear to auscultation bilaterally.  ABDOMEN:  Soft, round, nontender, positive bowel sounds x4.     LVAD exit site: well-healed and incorporated.  Dressing dry and intact.  No erythema or drainage.  Stabilization device present and accurately applied.  Driveline dressing is being changed daily per sterile technique. EXTREMITIES:  Warm and dry, no cyanosis, clubbing, rash or edema  NEUROLOGIC:  Alert and oriented x 3.    No aphasia.  No dysarthria.  Affect pleasant.        Telemetry  NSR 80-90s personally reviewed.   Labs   Basic Metabolic Panel: Recent  Labs  Lab 04/29/20 0042 04/30/20 0308 05/01/20 0034 05/02/20 0047 05/03/20 0159  NA 133* 135 135 136 136  K 3.3* 4.0 3.5 3.6 3.4*  CL 102 104 104 104 105  CO2 24 24 25 26 24   GLUCOSE 109* 105* 103* 117* 111*  BUN 6 5* 5* 5* <5*  CREATININE 0.76 0.74 0.91 0.72 0.70  CALCIUM 8.5* 9.3 8.9 8.6* 8.9  MG  --   --  1.2* 2.1 1.7    Liver Function Tests: No results for input(s): AST, ALT, ALKPHOS, BILITOT, PROT, ALBUMIN in the last 168 hours. No results for input(s): LIPASE, AMYLASE in the last 168 hours. No results for input(s): AMMONIA in the last 168 hours.  CBC: Recent Labs  Lab 04/29/20 0042 04/30/20 0308 05/01/20 0034 05/02/20 0047 05/03/20 0159  WBC 6.0 6.2 7.2 7.3 8.3  HGB 6.1* 10.5* 10.0* 10.0* 9.4*  HCT 18.8* 31.5* 31.2* 30.0* 28.6*  MCV 96.4 95.2 97.2 96.2 96.9  PLT 101* 126* 146* 185 221    INR: Recent Labs  Lab 04/29/20 0042 04/30/20 0308 05/01/20 0034 05/02/20 0047 05/03/20 0159  INR 1.7* 1.5* 1.2 1.1 1.1    Other results:  EKG:    Imaging   CT ABDOMEN PELVIS WO CONTRAST  Result Date: 05/01/2020 CLINICAL DATA:  Pt has redness and swelling around his driveline  insertion site. Concern for driveline infection. Pt has an LVAD EXAM: CT ABDOMEN AND PELVIS WITHOUT CONTRAST TECHNIQUE: Multidetector CT imaging of the abdomen and pelvis was performed following the standard protocol without IV contrast. COMPARISON:  None. FINDINGS: Lower chest: No acute abnormality. Hepatobiliary: No focal liver abnormality. No gallstones, gallbladder wall thickening, or pericholecystic fluid. No biliary dilatation. Pancreas: No focal lesion. Normal pancreatic contour. No surrounding inflammatory changes. No main pancreatic ductal dilatation. Spleen: Normal in size without focal abnormality. Adrenals/Urinary Tract: No adrenal nodule bilaterally. No nephrolithiasis, no hydronephrosis, and no contour-deforming renal mass. No ureterolithiasis or hydroureter. The urinary bladder is  unremarkable. Stomach/Bowel: Stomach is within normal limits. No evidence of bowel wall thickening or dilatation. Appendix appears normal. Vascular/Lymphatic: No abdominal aorta or iliac aneurysm.No abdominal, pelvic, or inguinal lymphadenopathy. Reproductive: Prostate is unremarkable. Other: No intraperitoneal free fluid. No intraperitoneal free gas. No organized fluid collection. Musculoskeletal: Area, less than 3 cm, of subcutaneus soft tissue edema surrounding the driveline insertion site along the left anterior abdomen with no associated organized fluid collection. IMPRESSION: 1. Subcutaneus soft tissue edema surrounding driveline insertion site along the left anterior abdomen with no associated organized fluid collection. Finding could represent an area of infection versus scarring. Please note markedly limited evaluation due to noncontrast study and streak artifact originating from the line insertion. Recommend clinical correlation. 2. Otherwise no acute intra-abdominal or intrapelvic abnormality on this noncontrast study. Electronically Signed   By: Tish Frederickson M.D.   On: 05/01/2020 19:22   DG Orthopantogram  Result Date: 05/02/2020 CLINICAL DATA:  LEFT upper tooth ache, broken tooth, bleeding, question infection, history LVAD EXAM: ORTHOPANTOGRAM/PANORAMIC COMPARISON:  02/12/2019 FINDINGS: Fracture and periodontal lucency again identified at a LEFT maxillary molar, upper LEFT quadrant Q2 #7. No additional periodontal disease changes or apical abscess identified. No additional does for fractures. Mandible otherwise intact. IMPRESSION: Fracture and periodontoidal lucency of a LEFT maxillary molar, Q2 #7. Electronically Signed   By: Ulyses Southward M.D.   On: 05/02/2020 12:52     Medications:     Scheduled Medications: . pantoprazole  40 mg Oral Daily  . potassium chloride  40 mEq Oral Q6H  . sodium chloride flush  3 mL Intravenous Q12H  . Warfarin - Pharmacist Dosing Inpatient   Does not apply  q1600    Infusions: . sodium chloride    . sodium chloride 20 mL/hr at 04/29/20 1646  .  ceFAZolin (ANCEF) IV 2 g (05/03/20 0908)  . heparin 500 Units/hr (05/03/20 0910)    PRN Medications: sodium chloride, acetaminophen, ondansetron (ZOFRAN) IV   Patient Profile   35 y/o male w/ chronic systolic HF s/p HM3 LVAD admitted w/ staph aureus drive-line infection  Assessment/Plan:    1. 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 -  Admitted for IV abx vancomycin/cefepime/Flagyl - Driveline culture with S aureus. Blood cx NGTD  - 3/27 abx narrowed to cefazolin 2gm IV Q 8hr, then one dose of IV oritiavancin prior to d/c  - CT abd/pelvis- soft tissue infection.  - Thank you! ID Team.   2.  Chronic Systolic HF due to NICM - Echo EF 10% with biventricular failure - HM-3 VAD implant 02/26/19 - Echo 7/21 EF 40-45% - Volume status stable.  - No bb or ARB with previous dehydration/RV failure.  - VAD interrogated personally. Parameters stable.  - INR 1.1. LDL 193  - Continue  IV heparin + warfarin.  - Discussed with pharmacy team.  3. Anemia - Baseline hgb has been upper 9's, admission hgb 7.7 => 6.1.  However, hgb up to 10.5 after 2 units PRCs and stool was heme negative.  Does not appear to be actively GI bleeding, ?initial drop dilutional with IVF at admission.   - Hgb 9.4 today.    4. F/E/N - We continue to worry about his nutritional status after his mother died.  5. Broken Teeth -Dental Team consulted - Panorex completed today. 1 broken tooth.  - I left message for Dental Team. Per Dr Gala Romney he would favor extraction this admit while INR is low.   Ambulate today.    I reviewed the LVAD parameters from today, and compared the results to the patient's prior recorded data.  No programming changes were made.  The LVAD is functioning within specified parameters.  The patient performs LVAD self-test daily.  LVAD interrogation was  negative for any significant power changes, alarms or PI events/speed drops.  LVAD equipment check completed and is in good working order.  Back-up equipment present.   LVAD education done on emergency procedures and precautions and reviewed exit site care.  Length of Stay: 5  [name], NP 05/03/2020, 10:33 AM  VAD Team --- VAD ISSUES ONLY--- Pager [phone] (7am - 7am)  Advanced Heart Failure Team  Pager [phone] (M-F; 7a - 5p)  Please contact [facility] for night-coverage after hours (5p -7a ) and weekends on [email or web address]  Patient seen and examined with the above-signed Advanced Practice Provider and/or Housestaff. I personally reviewed laboratory data, imaging studies and relevant notes. I independently examined the patient and formulated the important aspects of the plan. I have edited the note to reflect any of my changes or salient points. I have personally discussed the plan with the patient and/or family.  Remains on cefazolin. No fevers or chills. Volume status stable. INR 1.1 on heparin and warfarin.   General:  NAD.  HEENT: normal  Neck: supple. JVP not elevated.  Carotids 2+ bilat; no bruits. No lymphadenopathy or thryomegaly appreciated. Cor: LVAD hum.  Lungs: Clear. Abdomen:  soft, nontender, non-distended. No hepatosplenomegaly. No bruits or masses. Good bowel sounds. Driveline site clean. Anchor in place.  Extremities: no cyanosis, clubbing, rash. Warm no edema  Neuro: alert & oriented x 3. No focal deficits. Moves all 4 without problem   Continue cefazolin. Can give dose of oritavancin prior to d/c. Plan for dental extraction tomorrow. Continue heparin/warfarin. Discussed dosing with PharmD personally.  Arvilla Meres, MD  1:46 PM

## 2020-05-03 NOTE — Progress Notes (Signed)
ANTICOAGULATION CONSULT NOTE  Pharmacy Consult for warfarin + heparin Indication: LVAD  No Known Allergies  Patient Measurements: Weight: 55.1 kg (121 lb 7.6 oz)   Vital Signs: Temp: 97.7 F (36.5 C) (03/30 0715) Temp Source: Oral (03/30 0715) BP: 101/85 (03/30 0320) Pulse Rate: 77 (03/30 0300)  Labs: Recent Labs    05/01/20 0034 05/01/20 1751 05/02/20 0047 05/03/20 0159  HGB 10.0*  --  10.0* 9.4*  HCT 31.2*  --  30.0* 28.6*  PLT 146*  --  185 221  LABPROT 14.8  --  13.5 13.6  INR 1.2  --  1.1 1.1  HEPARINUNFRC  --  <0.10* <0.10* <0.10*  CREATININE 0.91  --  0.72 0.70    Estimated Creatinine Clearance: 101.4 mL/min (by C-G formula based on SCr of 0.7 mg/dL).   Medical History: Past Medical History:  Diagnosis Date  . Dilated cardiomyopathy (HCC)   . Dilated cardiomyopathy (HCC) 02/2019  . Polysubstance abuse (HCC)     Assessment: 29 yom presenting with concern for driveline infection - hx HM3 implanted on 02/26/19 - on warfarin PTA. PTA regimen is 2.5 mg daily.   INR remains subtherapeutic at 1.1 today as expected after given lower warfarin dose in anticipation for dental procedure. Procedure scheduled for tomorrow, 3/31. Patient reports that bleeding in his mouth has resolved. No other overt bleeding noted. Hgb has stabilized 9-10s, pltc wnl.  Low, fixed-dose heparin gtt started 3/28 and HL remains undetectable.     Goal of Therapy:  Heparin level <0.3 INR 2-2.5 Monitor platelets by anticoagulation protocol: Yes   Plan:  Continue IV heparin 500 units/hr at fixed rate Hold warfarin tonight F/u restart warfarin post dental procedure Daily INR, HL, s/sx bleeding  Tama Headings, PharmD, BCPS PGY2 Cardiology Pharmacy Resident Phone: [phone] 05/03/2020  10:38 AM  Please check [email or web address] for unit-specific pharmacy phone numbers.

## 2020-05-04 ENCOUNTER — Encounter (HOSPITAL_COMMUNITY)
Admission: AD | Disposition: A | Discharge status: Skilled Nursing Facility | Payer: Self-pay | Source: Ambulatory Visit | Attending: Internal Medicine

## 2020-05-04 ENCOUNTER — Encounter (HOSPITAL_COMMUNITY): Payer: Self-pay | Admitting: Internal Medicine

## 2020-05-04 ENCOUNTER — Inpatient Hospital Stay (HOSPITAL_COMMUNITY): Payer: Medicaid - Out of State | Admitting: Certified Registered Nurse Anesthetist

## 2020-05-04 DIAGNOSIS — K029 Dental caries, unspecified: Secondary | ICD-10-CM | POA: Diagnosis not present

## 2020-05-04 DIAGNOSIS — K0530 Chronic periodontitis, unspecified: Secondary | ICD-10-CM | POA: Diagnosis not present

## 2020-05-04 DIAGNOSIS — K083 Retained dental root: Secondary | ICD-10-CM | POA: Diagnosis not present

## 2020-05-04 DIAGNOSIS — T827XXA Infection and inflammatory reaction due to other cardiac and vascular devices, implants and grafts, initial encounter: Secondary | ICD-10-CM | POA: Diagnosis not present

## 2020-05-04 HISTORY — PX: MULTIPLE EXTRACTIONS WITH ALVEOLOPLASTY: SHX5342

## 2020-05-04 LAB — MAGNESIUM: Magnesium: 1.9 mg/dL (ref 1.7–2.4)

## 2020-05-04 LAB — PROTIME-INR
INR: 1 (ref 0.8–1.2)
Prothrombin Time: 13 seconds (ref 11.4–15.2)

## 2020-05-04 LAB — HEPARIN LEVEL (UNFRACTIONATED): Heparin Unfractionated: <0.1 IU/mL — ABNORMAL LOW (ref 0.30–0.70)

## 2020-05-04 LAB — LACTATE DEHYDROGENASE: LDH: 295 U/L — ABNORMAL HIGH (ref 98–192)

## 2020-05-04 SURGERY — MULTIPLE EXTRACTION WITH ALVEOLOPLASTY
Anesthesia: Monitor Anesthesia Care | Site: Mouth

## 2020-05-04 MED ORDER — WARFARIN SODIUM 7.5 MG PO TABS
7.5000 mg | ORAL_TABLET | Freq: Once | ORAL | Status: AC
  Administered 2020-05-04: 7.5 mg via ORAL
  Filled 2020-05-04: qty 1

## 2020-05-04 MED ORDER — NOREPINEPHRINE 4 MG/250ML-% IV SOLN
INTRAVENOUS | Status: AC
  Filled 2020-05-04: qty 250

## 2020-05-04 MED ORDER — OXYCODONE HCL 5 MG/5ML PO SOLN
5.0000 mg | Freq: Once | ORAL | Status: DC | PRN
Start: 2020-05-04 — End: 2020-05-04

## 2020-05-04 MED ORDER — MIDAZOLAM HCL 2 MG/2ML IJ SOLN
INTRAMUSCULAR | Status: AC
  Filled 2020-05-04: qty 4

## 2020-05-04 MED ORDER — OXYCODONE HCL 5 MG/5ML PO SOLN: 5.0000 mg | Freq: Once | ORAL | Status: DC | PRN

## 2020-05-04 MED ORDER — HEPARIN (PORCINE) 25000 UT/250ML-% IV SOLN
900.0000 [IU]/h | INTRAVENOUS | Status: DC
  Administered 2020-05-04 – 2020-05-05 (×3): 500 [IU]/h via INTRAVENOUS
  Administered 2020-05-07: 800 [IU]/h via INTRAVENOUS
  Administered 2020-05-10: 1000 [IU]/h via INTRAVENOUS
  Administered 2020-05-11: 900 [IU]/h via INTRAVENOUS
  Filled 2020-05-04 (×6): qty 250

## 2020-05-04 MED ORDER — ONDANSETRON HCL 4 MG/2ML IJ SOLN: 4.0000 mg | Freq: Once | INTRAMUSCULAR | Status: DC | PRN

## 2020-05-04 MED ORDER — PROPOFOL 1000 MG/100ML IV EMUL
INTRAVENOUS | Status: AC
  Filled 2020-05-04: qty 200

## 2020-05-04 MED ORDER — LACTATED RINGERS IV SOLN: INTRAVENOUS | Status: DC

## 2020-05-04 MED ORDER — AMINOCAPROIC ACID SOLUTION 5% (50 MG/ML)
10.0000 mL | ORAL | Status: DC
Start: 2020-05-04 — End: 2020-05-04
  Filled 2020-05-04: qty 100

## 2020-05-04 MED ORDER — BUPIVACAINE-EPINEPHRINE (PF) 0.5% -1:200000 IJ SOLN
INTRAMUSCULAR | Status: AC
  Filled 2020-05-04: qty 3.6

## 2020-05-04 MED ORDER — MIDAZOLAM HCL 5 MG/5ML IJ SOLN
INTRAMUSCULAR | Status: DC | PRN
  Administered 2020-05-04 (×2): 2 mg via INTRAVENOUS

## 2020-05-04 MED ORDER — 0.9 % SODIUM CHLORIDE (POUR BTL) OPTIME
TOPICAL | Status: DC | PRN
  Administered 2020-05-04: 1000 mL

## 2020-05-04 MED ORDER — FENTANYL CITRATE (PF) 100 MCG/2ML IJ SOLN: 25.0000 ug | INTRAMUSCULAR | Status: DC | PRN

## 2020-05-04 MED ORDER — FENTANYL CITRATE (PF) 250 MCG/5ML IJ SOLN
INTRAMUSCULAR | Status: DC | PRN
  Administered 2020-05-04: 50 ug via INTRAVENOUS
  Administered 2020-05-04 (×2): 25 ug via INTRAVENOUS

## 2020-05-04 MED ORDER — PHENYLEPHRINE HCL-NACL 10-0.9 MG/250ML-% IV SOLN
INTRAVENOUS | Status: AC
  Filled 2020-05-04: qty 250

## 2020-05-04 MED ORDER — KETAMINE HCL 50 MG/5ML IJ SOSY
PREFILLED_SYRINGE | INTRAMUSCULAR | Status: AC
  Filled 2020-05-04: qty 5

## 2020-05-04 MED ORDER — KETAMINE HCL 10 MG/ML IJ SOLN
INTRAMUSCULAR | Status: DC | PRN
  Administered 2020-05-04: 20 mg via INTRAVENOUS

## 2020-05-04 MED ORDER — THROMBIN (RECOMBINANT) 5000 UNITS EX SOLR
CUTANEOUS | Status: AC
  Filled 2020-05-04: qty 5000

## 2020-05-04 MED ORDER — OXYCODONE HCL 5 MG PO TABS
5.0000 mg | ORAL_TABLET | Freq: Once | ORAL | Status: DC | PRN
Start: 2020-05-04 — End: 2020-05-04

## 2020-05-04 MED ORDER — OXYMETAZOLINE HCL 0.05 % NA SOLN
NASAL | Status: AC
  Filled 2020-05-04: qty 30

## 2020-05-04 MED ORDER — OXYCODONE HCL 5 MG PO TABS: 5.0000 mg | ORAL_TABLET | Freq: Once | ORAL | Status: DC | PRN

## 2020-05-04 MED ORDER — CHLORHEXIDINE GLUCONATE 0.12 % MT SOLN
15.0000 mL | OROMUCOSAL | Status: AC
  Administered 2020-05-04: 15 mL via OROMUCOSAL

## 2020-05-04 MED ORDER — FENTANYL CITRATE (PF) 250 MCG/5ML IJ SOLN
INTRAMUSCULAR | Status: AC
  Filled 2020-05-04: qty 5

## 2020-05-04 MED ORDER — PROPOFOL 500 MG/50ML IV EMUL
INTRAVENOUS | Status: DC | PRN
  Administered 2020-05-04: 60 ug/kg/min via INTRAVENOUS

## 2020-05-04 MED ORDER — ETOMIDATE 2 MG/ML IV SOLN
INTRAVENOUS | Status: AC
  Filled 2020-05-04: qty 10

## 2020-05-04 MED ORDER — LIDOCAINE-EPINEPHRINE 2 %-1:100000 IJ SOLN
INTRAMUSCULAR | Status: DC | PRN
  Administered 2020-05-04 (×2): 1.7 mL via INTRADERMAL

## 2020-05-04 MED ORDER — LIDOCAINE 2% (20 MG/ML) 5 ML SYRINGE
INTRAMUSCULAR | Status: DC | PRN
  Administered 2020-05-04: 40 mg via INTRAVENOUS

## 2020-05-04 MED ORDER — CEFAZOLIN SODIUM-DEXTROSE 2-3 GM-%(50ML) IV SOLR
INTRAVENOUS | Status: DC | PRN
  Administered 2020-05-04: 2 g via INTRAVENOUS

## 2020-05-04 MED ORDER — PROPOFOL 10 MG/ML IV BOLUS
INTRAVENOUS | Status: AC
  Filled 2020-05-04: qty 20

## 2020-05-04 MED ORDER — CHLORHEXIDINE GLUCONATE 0.12 % MT SOLN
OROMUCOSAL | Status: AC
  Filled 2020-05-04: qty 15

## 2020-05-04 MED ORDER — LIDOCAINE-EPINEPHRINE 2 %-1:100000 IJ SOLN
INTRAMUSCULAR | Status: AC
  Filled 2020-05-04: qty 10.2

## 2020-05-04 MED ORDER — ALBUMIN HUMAN 5 % IV SOLN: INTRAVENOUS | Status: DC | PRN

## 2020-05-04 SURGICAL SUPPLY — 31 items
ALCOHOL 70% 16 OZ (MISCELLANEOUS) ×2 IMPLANT
BLADE SURG 15 STRL LF DISP TIS (BLADE) ×1 IMPLANT
BLADE SURG 15 STRL SS (BLADE) ×1
COVER SURGICAL LIGHT HANDLE (MISCELLANEOUS) ×2 IMPLANT
GAUZE 4X4 16PLY RFD (DISPOSABLE) ×2 IMPLANT
GAUZE PACKING FOLDED 2  STR (GAUZE/BANDAGES/DRESSINGS) ×1
GAUZE PACKING FOLDED 2 STR (GAUZE/BANDAGES/DRESSINGS) ×1 IMPLANT
GLOVE BIO SURGEON STRL SZ 6.5 (GLOVE) ×2 IMPLANT
GLOVE SURG SS PI 6.0 STRL IVOR (GLOVE) ×2 IMPLANT
GOWN STRL REUS W/ TWL LRG LVL3 (GOWN DISPOSABLE) ×2 IMPLANT
GOWN STRL REUS W/TWL LRG LVL3 (GOWN DISPOSABLE) ×2
KIT BASIN OR (CUSTOM PROCEDURE TRAY) ×2 IMPLANT
KIT TURNOVER KIT B (KITS) ×2 IMPLANT
MANIFOLD NEPTUNE II (INSTRUMENTS) ×2 IMPLANT
NS IRRIG 1000ML POUR BTL (IV SOLUTION) ×2 IMPLANT
PACK EENT II TURBAN DRAPE (CUSTOM PROCEDURE TRAY) ×2 IMPLANT
PAD ARMBOARD 7.5X6 YLW CONV (MISCELLANEOUS) ×2 IMPLANT
SPONGE SURGIFOAM ABS GEL 100 (HEMOSTASIS) IMPLANT
SPONGE SURGIFOAM ABS GEL 12-7 (HEMOSTASIS) IMPLANT
SPONGE SURGIFOAM ABS GEL SZ50 (HEMOSTASIS) IMPLANT
SUCTION FRAZIER HANDLE 10FR (MISCELLANEOUS) ×1
SUCTION TUBE FRAZIER 10FR DISP (MISCELLANEOUS) ×1 IMPLANT
SUT CHROMIC 3 0 PS 2 (SUTURE) ×3 IMPLANT
SUT CHROMIC 4 0 P 3 18 (SUTURE) IMPLANT
SYR 50ML SLIP (SYRINGE) ×2 IMPLANT
SYR BULB IRRIG 60ML STRL (SYRINGE) ×2 IMPLANT
TOWEL GREEN STERILE FF (TOWEL DISPOSABLE) ×2 IMPLANT
TUBE CONNECTING 12X1/4 (SUCTIONS) ×2 IMPLANT
WATER STERILE IRR 1000ML POUR (IV SOLUTION) ×2 IMPLANT
WATER TABLETS ICX (MISCELLANEOUS) ×2 IMPLANT
YANKAUER SUCT BULB TIP NO VENT (SUCTIONS) ×2 IMPLANT

## 2020-05-04 NOTE — Progress Notes (Addendum)
Patient ID: Jon Martin, male   DOB: January 21, 1986, 35 y.o.   MRN: [id number]   Advanced Heart Failure VAD Team Note  PCP-Cardiologist: No primary care provider on file.   Subjective:     HM3 LVAD admitted w/ Drive-line infection.  - Wound culture with S aureus. Blood cx NGTD - 3/26 abx narrowed to cefazolin 2gm IV Q 8hr. ID following  -3/28 CT abd/pelvis soft tissue no deep infection. Oral Surgeon consulted.   Scheduled for multiple teeth extractions today.   MAPs lower today in 70s (NPO). No dizziness.   INR 1.0.  LDH 295  Wt stable. Feels well. No complaints.   LVAD INTERROGATION:  HeartMate 3 LVAD:   Flow 3.6  liters/min, speed 5450, power 3.9  PI 4.9   3 PI events   Objective:    Vital Signs:   Temp:  [98.1 F (36.7 C)-98.7 F (37.1 C)] 98.3 F (36.8 C) (03/31 0336) Pulse Rate:  [76-110] 110 (03/31 0336) Resp:  [17-19] 17 (03/31 0336) BP: (96-104)/(74-91) 104/91 (03/31 0336) SpO2:  [100 %] 100 % (03/31 0336) Weight:  [55.2 kg] 55.2 kg (03/31 0600) Last BM Date: 05/03/20 Mean arterial Pressure 80-90s   Intake/Output:   Intake/Output Summary (Last 24 hours) at 05/04/2020 0732 Last data filed at 05/04/2020 0640 Gross per 24 hour  Intake 788.73 ml  Output 1250 ml  Net -461.27 ml     Physical Exam   Physical Exam: GENERAL: young, thin AAM. No acute distress. HEENT: normal  NECK: Supple, No JVD   2+ bilaterally, no bruits.  No lymphadenopathy or thyromegaly appreciated.   CARDIAC:  Mechanical heart sounds with LVAD hum present.  LUNGS:  Clear to auscultation bilaterally. No wheezing  ABDOMEN:  Soft, round, nontender, positive bowel sounds x4.     LVAD exit site: well-healed and incorporated.  Dressing dry and intact.  No erythema or drainage.  Stabilization device present and accurately applied.  Driveline dressing is being changed daily per sterile technique. EXTREMITIES:  Warm and dry, no cyanosis, clubbing, rash or edema  NEUROLOGIC:  Alert and oriented x 3.     No aphasia.  No dysarthria.  Affect pleasant.        Telemetry   NSR 80s  personally reviewed.   Labs   Basic Metabolic Panel: Recent Labs  Lab 04/29/20 0042 04/30/20 0308 05/01/20 0034 05/02/20 0047 05/03/20 0159 05/04/20 0047  NA 133* 135 135 136 136  --   K 3.3* 4.0 3.5 3.6 3.4*  --   CL 102 104 104 104 105  --   CO2 24 24 25 26 24   --   GLUCOSE 109* 105* 103* 117* 111*  --   BUN 6 5* 5* 5* <5*  --   CREATININE 0.76 0.74 0.91 0.72 0.70  --   CALCIUM 8.5* 9.3 8.9 8.6* 8.9  --   MG  --   --  1.2* 2.1 1.7 1.9    Liver Function Tests: No results for input(s): AST, ALT, ALKPHOS, BILITOT, PROT, ALBUMIN in the last 168 hours. No results for input(s): LIPASE, AMYLASE in the last 168 hours. No results for input(s): AMMONIA in the last 168 hours.  CBC: Recent Labs  Lab 04/29/20 0042 04/30/20 0308 05/01/20 0034 05/02/20 0047 05/03/20 0159  WBC 6.0 6.2 7.2 7.3 8.3  HGB 6.1* 10.5* 10.0* 10.0* 9.4*  HCT 18.8* 31.5* 31.2* 30.0* 28.6*  MCV 96.4 95.2 97.2 96.2 96.9  PLT 101* 126* 146* 185 221    INR:  Recent Labs  Lab 04/30/20 0308 05/01/20 0034 05/02/20 0047 05/03/20 0159 05/04/20 0047  INR 1.5* 1.2 1.1 1.1 1.0    Other results:  EKG:    Imaging   DG Orthopantogram  Result Date: 05/02/2020 CLINICAL DATA:  LEFT upper tooth ache, broken tooth, bleeding, question infection, history LVAD EXAM: ORTHOPANTOGRAM/PANORAMIC COMPARISON:  02/12/2019 FINDINGS: Fracture and periodontal lucency again identified at a LEFT maxillary molar, upper LEFT quadrant Q2 #7. No additional periodontal disease changes or apical abscess identified. No additional does for fractures. Mandible otherwise intact. IMPRESSION: Fracture and periodontoidal lucency of a LEFT maxillary molar, Q2 #7. Electronically Signed   By: Ulyses Southward M.D.   On: 05/02/2020 12:52     Medications:     Scheduled Medications: . pantoprazole  40 mg Oral Daily  . sodium chloride flush  3 mL Intravenous Q12H   . Warfarin - Pharmacist Dosing Inpatient   Does not apply q1600    Infusions: . sodium chloride    . sodium chloride 20 mL/hr at 04/29/20 1646  .  ceFAZolin (ANCEF) IV 2 g (05/04/20 0130)  . heparin 500 Units/hr (05/03/20 0910)    PRN Medications: sodium chloride, acetaminophen, ondansetron (ZOFRAN) IV   Patient Profile   35 y/o male w/ chronic systolic HF s/p HM3 LVAD admitted w/ staph aureus drive-line infection  Assessment/Plan:    1. 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 -  Admitted for IV abx vancomycin/cefepime/Flagyl - Driveline culture with S aureus. Blood cx NGTD  - 3/27 abx narrowed to cefazolin 2gm IV Q 8hr, then one dose of IV oritiavancin prior to d/c  - CT abd/pelvis- soft tissue infection.  - Thank you! ID Team.   2.  Chronic Systolic HF due to NICM - Echo EF 10% with biventricular failure - HM-3 VAD implant 02/26/19 - Echo 7/21 EF 40-45% - Volume status stable.  - No bb or ARB with previous dehydration/RV failure.  - VAD interrogated personally. Parameters stable.  - INR 1.0. LDL 295  - Continue  IV heparin + warfarin.  - Discussed with pharmacy team.   3. Anemia - Baseline hgb has been upper 9's, admission hgb 7.7 => 6.1.  However, hgb up to 10.5 after 2 units PRCs and stool was heme negative.  Does not appear to be actively GI bleeding, ?initial drop dilutional with IVF at admission.   - Hgb 9.4 yesterday. Repeat CBC tomorrow      4. F/E/N - We continue to worry about his nutritional status after his mother died.  5. Broken Teeth -Dental Team consulted - Panorex completed. 1 broken tooth.  - Plan dental extractions today   I reviewed the LVAD parameters from today, and compared the results to the patient's prior recorded data.  No programming changes were made.  The LVAD is functioning within specified parameters.  The patient performs LVAD self-test daily.  LVAD interrogation was negative for any  significant power changes, alarms or PI events/speed drops.  LVAD equipment check completed and is in good working order.  Back-up equipment present.   LVAD education done on emergency procedures and precautions and reviewed exit site care.  Length of Stay: [address] [place] 05/04/2020, 7:32 AM  VAD Team --- VAD ISSUES ONLY--- Pager [phone] (7am - 7am)  Advanced Heart Failure Team  Pager [phone] (M-F; 7a - 5p)  Please contact [facility] for night-coverage after hours (5p -7a ) and weekends on [email or web address]  Patient seen and examined with the above-signed Advanced Practice Provider and/or Housestaff. I personally reviewed laboratory data, imaging studies and relevant notes. I independently examined the patient and formulated the important aspects of the plan. I have edited the note to reflect any of my changes or salient points. I have personally discussed the plan with the patient and/or family.  Teeth extracted today without complication. Mouth sore. Sleepy. Oral surgery wants heparin off at least 6 hours. INR 1.0. On IV abx for DL infection. Afebrile.   General:  NAD.  HEENT: normal  Mouth mildly swollen  Neck: supple. JVP not elevated.  Carotids 2+ bilat; no bruits. No lymphadenopathy or thryomegaly appreciated. Cor: LVAD hum.  Lungs: Clear. Abdomen: soft, nontender, non-distended. No hepatosplenomegaly. No bruits or masses. Good bowel sounds. Driveline site clean. Anchor in place.  Extremities: no cyanosis, clubbing, rash. Warm no edema  Neuro: sleepy but arousable No focal deficits. Moves all 4 without problem   S/p dental extractions. Will follow throughout the day. If having some bleeding will hold off on restarting heparin until tomorrow. If looks good can start heparin at 500u/hr later today. Continue warfarin. Discussed dosing with PharmD personally.  Remains on abx. DL site improving. VAD interrogated personally. Parameters stable. LDH up slightly. Will follow.    Arvilla Meres, MD  12:41 PM

## 2020-05-04 NOTE — Progress Notes (Signed)
   Department of Dental Medicine     PREOPERATIVE NOTE  Service Date:  05/04/2020 Patient's Name:  Jon Martin Medical Record Number:  [id number]   > Donalda Ewings presents for dental procedures in the operating room. >>The patient denies any acute medical or dental changes and agrees to proceed with treatment as planned.  VITALS: BP (!) 88/72   Pulse (!) 110   Temp 98.4 F (36.9 C) (Oral)   Resp 17   Ht 5\' 5"  (1.651 m)   Wt 55.2 kg   SpO2 100%   BMI 20.25 kg/m   Lab Results  Component Value Date   WBC 8.3 05/03/2020   HGB 9.4 (L) 05/03/2020   HCT 28.6 (L) 05/03/2020   MCV 96.9 05/03/2020   PLT 221 05/03/2020   BMET    Component Value Date/Time   NA 136 05/03/2020 0159   K 3.4 (L) 05/03/2020 0159   CL 105 05/03/2020 0159   CO2 24 05/03/2020 0159   GLUCOSE 111 (H) 05/03/2020 0159   BUN <5 (L) 05/03/2020 0159   CREATININE 0.70 05/03/2020 0159   CALCIUM 8.9 05/03/2020 0159   GFRNONAA >60 05/03/2020 0159   GFRAA >60 10/20/2019 1102    Lab Results  Component Value Date   INR 1.0 05/04/2020   INR 1.1 05/03/2020   INR 1.1 05/02/2020   No results found for: PTT   EXAM: No sign of acute dental changes.   ASSESSMENT:  1. Dental caries 2. 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 Patient is aware of the risk for bleeding, bruising, swelling, infection, pain, nerve damage, sinus involvement, root tip fracture, mandible fracture, and the risks of complications associated with the anesthesia. Patient also is aware of the potential for other complications not mentioned above.   Maleiya Pergola B. 05/04/2020, DMD

## 2020-05-04 NOTE — Plan of Care (Signed)
  Problem: Education: Goal: Knowledge of General Education information will improve Description: Including pain rating scale, medication(s)/side effects and non-pharmacologic comfort measures 05/04/2020 0037 by Lennice Sites, RN Outcome: Progressing 05/04/2020 0037 by Lennice Sites, RN Outcome: Progressing   Problem: Clinical Measurements: Goal: Ability to maintain clinical measurements within normal limits will improve Outcome: Progressing Goal: Will remain free from infection Outcome: Progressing Goal: Diagnostic test results will improve Outcome: Progressing Goal: Respiratory complications will improve Outcome: Progressing   Problem: Activity: Goal: Risk for activity intolerance will decrease Outcome: Progressing   Problem: Nutrition: Goal: Adequate nutrition will be maintained Outcome: Progressing

## 2020-05-04 NOTE — Progress Notes (Addendum)
ANTICOAGULATION CONSULT NOTE  Pharmacy Consult for warfarin + heparin Indication: LVAD  No Known Allergies  Patient Measurements: Height: 5\' 5"  (165.1 cm) Weight: 55.2 kg (121 lb 11.1 oz) IBW/kg (Calculated) : 61.5   Vital Signs: Temp: 98.4 F (36.9 C) (03/31 0738) Temp Source: Oral (03/31 0738) BP: 88/72 (03/31 0738) Pulse Rate: 110 (03/31 0336)  Labs: Recent Labs    05/02/20 0047 05/03/20 0159 05/04/20 0047  HGB 10.0* 9.4*  --   HCT 30.0* 28.6*  --   PLT 185 221  --   LABPROT 13.5 13.6 13.0  INR 1.1 1.1 1.0  HEPARINUNFRC <0.10* <0.10* <0.10*  CREATININE 0.72 0.70  --     Estimated Creatinine Clearance: 101.6 mL/min (by C-G formula based on SCr of 0.7 mg/dL).   Medical History: Past Medical History:  Diagnosis Date  . Dilated cardiomyopathy (HCC)   . Dilated cardiomyopathy (HCC) 02/2019  . Polysubstance abuse (HCC)     Assessment: 48 yom presenting with concern for driveline infection - hx HM3 implanted on 02/26/19 - on warfarin PTA. PTA regimen is 2.5 mg daily.   INR remains subtherapeutic at 1.0. He is now s/p dental extractions this morning. No overt bleeding noted. Hgb has stabilized 9-10s, pltc wnl. LDH bumped 193>295 today.  Low, fixed-dose heparin gtt started 3/28 and HL remains undetectable.   Per discussion with Dr. 4/28 and Dr. Gala Romney, will restart warfarin today and hold heparin immediately post-op. As long as no bleeding as of 3-4pm today, will plan to restart low, fixed-dose heparin.  Goal of Therapy:  Heparin level <0.3 INR 2-2.5 Monitor platelets by anticoagulation protocol: Yes   Plan:  Hold IV heparin for now Follow-up for bleeding this afternoon  Warfarin 7.5 mg PO x1  Daily INR, HL, s/sx bleeding  ================================ PM Addendum: Patient is not bleeding post-dental extractions this morning. Per discussion with HF team, will restart low, fixed-dose heparin IV 500 units/hr (no up-titrations) and check a 6-hr  HL.  Chales Salmon, PharmD, BCPS PGY2 Cardiology Pharmacy Resident Phone: [phone] 05/04/2020  10:30 AM  Please check [email or web address] for unit-specific pharmacy phone numbers.

## 2020-05-04 NOTE — Progress Notes (Signed)
LVAD Coordinator Rounding Note:  Admitted 04/28/20 due to Dr. Clayborne Dana service for VAD drive line infection.   HM III LVAD implanted on 02/26/19 by Dr. Darcey Nora under Destination Therapy criteria.  Met pt in SS this morning prior to this teeth extractions with DR. Owsley. Pt states that he is ready to have his" teeth taken care of".  Vital signs: Temp: 97.1 HR: 92 Doppler Pressure: 76 Automatic BP: 100/79 (87) O2 Sat: 100% RA Wt: 122.8>123>124.1>120.8>121.4>121.6 lbs   LVAD interrogation reveals:  Speed: 5400 Flow: 3.7  Power: 3.9w PI: 5.0  Hct: 20 - do not change  Alarms: none Events: rare   Fixed speed: 5400 Low speed limit: 5100  Drive Line: CDI. Pt's anchor is dangling of his side this morning. Drive line anchor replaced again today. May advance to every other day dressing changes using silk tape per BS nurse. Next dressing change due 05/05/20.    Labs:  LDH trend: 181>171>185>196>193>295  INR trend: 2.3>1.7>1.2>1.1>1.1>1.0  Anticoagulation Plan: -INR Goal: 2.0 - 2.5 -ASA Dose: none  Blood Products:  - 04/29/20>>2 units PCs  Device: N/A  Infection:  - 04/28/20 BCs>> No growth x 3 days - 04/28/20 DL culture>>rare staph aureus; final  Drips:  Heparin @ 500 units/hr-on hold to the OR  Plan/Recommendations:  1. Call VAD Coordinator if any VAD equipment or drive line issues. 2. Every other day dressing changes per BS nurse.   Tanda Rockers RN [place] Coordinator  Office: [phone]  24/7 Pager: [phone]

## 2020-05-04 NOTE — Anesthesia Postprocedure Evaluation (Signed)
Anesthesia Post Note  Patient: Jon Martin  Procedure(s) Performed: MULTIPLE EXTRACTION TEETH FIVE AND FIFTEEN WITH ALVEOLOPLASTY (N/A Mouth)     Patient location during evaluation: PACU Anesthesia Type: MAC Level of consciousness: awake and alert Pain management: pain level controlled Vital Signs Assessment: post-procedure vital signs reviewed and stable Respiratory status: spontaneous breathing, nonlabored ventilation, respiratory function stable and patient connected to nasal cannula oxygen Cardiovascular status: stable and blood pressure returned to baseline Postop Assessment: no apparent nausea or vomiting Anesthetic complications: no   No complications documented.  Last Vitals:  Vitals:   05/04/20 1120 05/04/20 1637  BP: 104/80 111/68  Pulse: 90   Resp: 15   Temp: (!) 36.3 C 36.7 C  SpO2: 100%     Last Pain:  Vitals:   05/04/20 1637  TempSrc: Oral  PainSc: 0-No pain                 [name]

## 2020-05-04 NOTE — Op Note (Signed)
Department of Dental Medicine    OPERATIVE REPORT  DATE OF SURGERY:  05/04/2020  PATIENT NAME:   Jon Martin DATE OF BIRTH:   24-Apr-1985 MEDICAL RECORD NUMBER: [id number]  SURGEON:  Johnna Bollier B. Chales Salmon, D.M.D.  ASSISTANT:  Pearletha Alfred, DAII  PREOPERATIVE DIAGNOSES:  Dental caries, periodontal disease Patient Active Problem List   Diagnosis Date Noted  . Retained dental root   . Chronic periodontitis   . Complication involving left ventricular assist device (LVAD) 04/28/2020  . Dental caries 04/28/2020  . 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/2020  . Left ventricular assist device (LVAD) complication 01/12/2020  . Dehydration, moderate 08/02/2019  . Chronic systolic heart failure (HCC) 08/02/2019  . AKI (acute kidney injury) (HCC) 08/02/2019  . Intractable vomiting with nausea 08/02/2019  . LVAD (left ventricular assist device) present (HCC) 08/02/2019  . Hypokalemia 08/02/2019  . Congestive heart failure (HCC)   . Cardiogenic shock (HCC)   . Goals of care, counseling/discussion   . Preoperative evaluation to rule out surgical contraindication   . Protein-calorie malnutrition, severe 02/11/2019  . Dilated cardiomyopathy (HCC) 02/10/2019    POSTOPERATIVE DIAGNOSES: Same  PROCEDURES PERFORMED: 1. Extractions of teeth numbers 5 and 15  ANESTHESIA: IV sedation  MEDICATIONS: 1. Ancef 2 g IV prior to invasive dental procedures. 2. Local anesthesia with a total utilization of 2 cartridges of 34 mg of lidocaine with 0.018 mg of epinephrine/ea.  SPECIMENS: 2 teeth that were extracted and discarded.  DRAINS/CULTURES: None  COMPLICATIONS: None  ESTIMATED BLOOD LOSS: 5 mL  INTRAVENOUS FLUIDS: 10 mL of Lactated ringers solution.  INDICATION: The patient was recently diagnosed with a driveline infection of LVAD implant.  A medically necessary dental consult was then requested to evaluate the patient for any  dental/orofacial infection and their overall oral health.  The patient was examined and subsequently treatment planned for extractions of chronically infected teeth/retained root tips.  This treatment plan was made to decrease the perioperative and postoperative risks and complications associated with dental/orofacial infection from affecting the patient's systemic health.  OPERATIVE FINDINGS:  The patient was examined in operating room number 11.  The indicated teeth were identified and verified for extraction. The patient was noted be affected by severe dental decay, chronic periodontitis and chronic apical periodontitis.  DESCRIPTION OF PROCEDURE: The patient was identified in the holding area and brought to the main operating room number 11 by the anesthesia team. The patient was then placed in the supine position on the operating table.  Anesthesia was then induced per the anesthesia team. The patient was then prepped and draped in the usual sterile fashion for dental medicine procedures.  A timeout was performed. The patient was identified and procedures were verified. 4 x 4 gauze was placed to protect the oropharynx. The oral cavity was then thoroughly examined with the findings noted above. The patient was then ready for the dental medicine procedure as follows:  ROUTINE EXTRACTIONS: Local anesthesia was administered sequentially with a total utilization of 2 cartridges each containing 34 mg of lidocaine with 0.018 mg of epinephrine. Location of anesthesia included #5 and #15 infiltration and palatal.  The maxillary left and right quadrants were first approached. The teeth were then subluxated with a series of straight elevators. Tooth numbers 5 and 15 were then removed with a ronguers without complications. 2 roots extracted from each tooth in entirety; searched for #15 3rd root , but was not present on radiograph or clinical exam. Alveoloplasty  was then performed utilizing a ronguers and bone  file. The surgical sites were then irrigated with copious amounts of sterile saline. No sutures were placed to allow for drainage of periapical infection.  END OF PROCEDURE: Thorough oral irrigation with sterile saline was performed. The patient was examined for complications, and seeing none, the dental medicine procedure was deemed to be complete. The 4 x 4 gauze net was removed at this time. An oral airway was then placed at the request of the anesthesia team. A series of 4 x 4 gauze were placed in the mouth to aid hemostasis as needed. The patient was then handed over to the anesthesia team for final disposition. After an appropriate amount of time, the patient was taken to the postanesthsia care unit in stable condition. All counts were correct for the dental medicine procedure.   Lee Kuang B. Chales Salmon, D.M.D.

## 2020-05-04 NOTE — Interval H&P Note (Signed)
History and Physical Interval Note:  05/04/2020 9:48 AM  Jon Martin  has presented today for surgery, with the diagnosis of dental carries.  The various methods of treatment have been discussed with the patient and family. After consideration of risks, benefits and other options for treatment, the patient has consented to  Procedure(s): MULTIPLE EXTRACTION WITH ALVEOLOPLASTY (N/A) as a surgical intervention.  The patient's history has been reviewed, patient examined, no change in status, stable for surgery.  I have reviewed the patient's chart and labs.  Questions were answered to the patient's satisfaction.     Sharman Cheek

## 2020-05-04 NOTE — Progress Notes (Signed)
VAD Coordinator Procedure Note:   VAD Coordinator met patient in Short Stay. Pt undergoing teeth extractions per Dr. Benson Norway. Hemodynamics and VAD parameters monitored by myself and anesthesia throughout the procedure. Blood pressures were obtained with automatic cuff on Right arm and correlated with Doppler.    Time: Doppler Auto  BP Flow PI Power Speed  Pre-procedure:  0947  106/74 (83) 3.7 5.0 3.9 5400                    Sedation Induction: 1018  105/79 3.7 4.7 3.8 5400   1030  100/68(79) 4.0 3.6 3.8 5400   1038  101/76 (83) 4.2 3.2 3.9 5400           Recovery Area: 1050  76/58(65) 3.8 4.1 3.8 5400   1105  100/79(87) 3.9 4.1 3.9 5400    Patient tolerated the procedure well. VAD Coordinator accompanied and remained with patient in recovery area.    Patient Disposition: pt transported to [facility] and handoff given to bedside nurse. Amicar given to bedside nurse.   Tanda Rockers RN, BSN VAD Coordinator 24/7 Pager [phone]

## 2020-05-04 NOTE — Transfer of Care (Signed)
Immediate Anesthesia Transfer of Care Note  Patient: Jon Martin  Procedure(s) Performed: MULTIPLE EXTRACTION TEETH FIVE AND FIFTEEN WITH ALVEOLOPLASTY (N/A Mouth)  Patient Location: PACU  Anesthesia Type:MAC  Level of Consciousness: drowsy and responds to stimulation  Airway & Oxygen Therapy: Patient Spontanous Breathing and Patient connected to nasal cannula oxygen  Post-op Assessment: Report given to RN  Post vital signs: Reviewed and stable  Last Vitals:  Vitals Value Taken Time  BP 76/58 05/04/20 1050  Temp    Pulse 96 05/04/20 1051  Resp 12 05/04/20 1051  SpO2 100 % 05/04/20 1051  Vitals shown include unvalidated device data.  Last Pain:  Vitals:   05/04/20 0738  TempSrc: Oral  PainSc: 0-No pain         Complications: No complications documented.

## 2020-05-04 NOTE — Anesthesia Preprocedure Evaluation (Signed)
Anesthesia Evaluation  Patient identified by MRN, date of birth, ID band Patient awake    Reviewed: Allergy & Precautions, NPO status , Patient's Chart, lab work & pertinent test results  Airway Mallampati: II  TM Distance: >3 FB Neck ROM: Full    Dental  (+) Poor Dentition   Pulmonary former smoker,    breath sounds clear to auscultation       Cardiovascular  Rhythm:Regular Rate:Normal  Mechanical hum    Neuro/Psych    GI/Hepatic   Endo/Other    Renal/GU      Musculoskeletal   Abdominal   Peds  Hematology   Anesthesia Other Findings   Reproductive/Obstetrics                             Anesthesia Physical Anesthesia Plan  ASA: III  Anesthesia Plan: MAC   Post-op Pain Management:    Induction: Intravenous  PONV Risk Score and Plan: Ondansetron and Propofol infusion  Airway Management Planned: Natural Airway and Nasal Cannula  Additional Equipment:   Intra-op Plan:   Post-operative Plan:   Informed Consent: I have reviewed the patients History and Physical, chart, labs and discussed the procedure including the risks, benefits and alternatives for the proposed anesthesia with the patient or authorized representative who has indicated his/her understanding and acceptance.       Plan Discussed with: CRNA and Anesthesiologist  Anesthesia Plan Comments:         Anesthesia Quick Evaluation

## 2020-05-04 NOTE — Discharge Instructions (Signed)
[facility] Dr. Ahsha Hinsley B. Anselmo Reihl, DMD Phone: [phone] Fax: [phone]    MOUTH CARE AFTER SURGERY   FACTS:  Ice used in ice bag helps keep the swelling down, and can help lessen the pain.  It is easier to treat pain BEFORE it happens.  Spitting disturbs the clot and may cause bleeding to start again, or to get worse.  Smoking delays healing and can cause complications.  Sharing prescriptions can be dangerous.  Do not take medications not recently prescribed for you.  Antibiotics may stop birth control pills from working.  Use other means of birth control while on antibiotics.  Warm salt water rinses after the first 24 hours will help lessen the swelling:  Use 1/2 teaspoonful of table salt per oz.of water.  DO NOT:  Do not spit.    Do not drink through a straw.  Strongly advised not to smoke, dip snuff or chew tobacco at least for 3 days.  Do not eat sharp or crunchy foods.  Avoid the area of surgery when chewing.  Do not stop your antibiotics before your instructions say to do so.  Do not eat hot foods until bleeding has stopped.  If you need to, let your food cool down to room temperature.  EXPECT:  Some swelling, especially first 2-3 days.  Soreness or discomfort in varying degrees.  Follow your dentist's instructions about how to handle pain before it starts.  Pinkish saliva or light blood in saliva, or on your pillow in the morning.  This can last around 24 hours.  Bruising inside or outside the mouth.  This may not show up until 2-3 days after surgery.  Don't worry, it will go away in time.  Pieces of "bone" may work themselves loose.  It's OK.  If they bother you, let us know.  WHAT TO DO IMMEDIATELY AFTER SURGERY:  Bite on gauze with steady pressure for 30-45 minutes at a time.  Switch out the gauze after 30-45 minutes for clean gauze, and continue this for 1-2 hours or until bleeding subsides.  Do not chew on the  gauze.  Do not lie down flat.  Raise your head support especially for the first 24 hours.  Apply ice to your face on the side of the surgery.  You may apply it 20 minutes on and a few minutes off.  Ice for 8-12 hours.  You may use ice up to 24 hours.  Before the numbness wears off, take a pain pill as instructed.  Prescription pain medication is not always required.  SWELLING:  Expect swelling for the first couple of days.  It should get better after that.  If swelling increases 3 days or so after surgery, let us know as soon as possible.  FEVER:  Take Tylenol every 4 hours if needed to lower your temperature, especially if it is at 100F or higher.  Drink lots of fluids.  If the fever does not go away, let us know.  BREATHING TROUBLE:  Any unusual difficulty breathing means you have to have someone bring you to the emergency room ASAP.  BLEEDING:  Light oozing is expected for 24 hours or so.  Prop head up with pillows.  Do not spit.  Do not confuse bright red fresh flowing blood with lots of saliva colored with a little bit of blood.  If you notice some bleeding, place gauze or a tea bag where it is bleeding and apply CONSTANT pressure by biting   down for 1 hour.  Avoid talking during this time.  Do not remove the gauze or tea bag during this hour to "check" the bleeding.  If you notice bright RED bleeding FLOWING out of particular area, and filling the floor of your mouth, put a wad of gauze on that area, bite down firmly and constantly.  Call us immediately.  If we're closed, have someone bring you to the emergency room.  ORAL HYGIENE:  Brush your teeth as usual after meals and before bedtime.  Use a soft toothbrush around the area of surgery.  DO NOT AVOID BRUSHING.  Otherwise bacteria(germs) will grow and may delay healing or encourage infection.  Since you cannot spit, just gently rinse and let the water flow out of your mouth.  DO NOT SWISH  HARD.  EATING:  Cool liquids are a good point to start.  Increase to soft foods as tolerated.  PRESCRIPTIONS:  Follow the directions for your prescriptions exactly as written.  If your doctor gave you a narcotic pain medication, do not drive, operate machinery or drink alcohol when on that medication.  QUESTIONS:  Call our office during office hours [phone] or call the Emergency Room at [phone]. 

## 2020-05-05 ENCOUNTER — Encounter (HOSPITAL_COMMUNITY): Payer: Self-pay | Admitting: Dentistry

## 2020-05-05 DIAGNOSIS — I5022 Chronic systolic (congestive) heart failure: Secondary | ICD-10-CM | POA: Diagnosis not present

## 2020-05-05 DIAGNOSIS — D649 Anemia, unspecified: Secondary | ICD-10-CM

## 2020-05-05 DIAGNOSIS — T827XXA Infection and inflammatory reaction due to other cardiac and vascular devices, implants and grafts, initial encounter: Secondary | ICD-10-CM | POA: Diagnosis not present

## 2020-05-05 LAB — CBC
HCT: 29.2 % — ABNORMAL LOW (ref 39.0–52.0)
Hemoglobin: 9.1 g/dL — ABNORMAL LOW (ref 13.0–17.0)
MCH: 30.6 pg (ref 26.0–34.0)
MCHC: 31.2 g/dL (ref 30.0–36.0)
MCV: 98.3 fL (ref 80.0–100.0)
Platelets: 293 10*3/uL (ref 150–400)
RBC: 2.97 MIL/uL — ABNORMAL LOW (ref 4.22–5.81)
RDW: 20.8 % — ABNORMAL HIGH (ref 11.5–15.5)
WBC: 6.4 10*3/uL (ref 4.0–10.5)
nRBC: 0 % (ref 0.0–0.2)

## 2020-05-05 LAB — BASIC METABOLIC PANEL
Anion gap: 6 (ref 5–15)
BUN: <5 mg/dL — ABNORMAL LOW (ref 6–20)
CO2: 26 mmol/L (ref 22–32)
Calcium: 8.8 mg/dL — ABNORMAL LOW (ref 8.9–10.3)
Chloride: 104 mmol/L (ref 98–111)
Creatinine, Ser: 0.68 mg/dL (ref 0.61–1.24)
GFR, Estimated: >60 mL/min (ref >60)
Glucose, Bld: 127 mg/dL — ABNORMAL HIGH (ref 70–99)
Potassium: 3.6 mmol/L (ref 3.5–5.1)
Sodium: 136 mmol/L (ref 135–145)

## 2020-05-05 LAB — MAGNESIUM: Magnesium: 1.7 mg/dL (ref 1.7–2.4)

## 2020-05-05 LAB — PROTIME-INR
INR: 1 (ref 0.8–1.2)
Prothrombin Time: 13.1 seconds (ref 11.4–15.2)

## 2020-05-05 LAB — HEPARIN LEVEL (UNFRACTIONATED)
Heparin Unfractionated: <0.1 IU/mL — ABNORMAL LOW (ref 0.30–0.70)
Heparin Unfractionated: <0.1 IU/mL — ABNORMAL LOW (ref 0.30–0.70)

## 2020-05-05 LAB — LACTATE DEHYDROGENASE: LDH: 233 U/L — ABNORMAL HIGH (ref 98–192)

## 2020-05-05 MED ORDER — WARFARIN SODIUM 5 MG PO TABS
5.0000 mg | ORAL_TABLET | Freq: Once | ORAL | Status: AC
  Administered 2020-05-05: 5 mg via ORAL
  Filled 2020-05-05: qty 1

## 2020-05-05 MED ORDER — ORITAVANCIN DIPHOSPHATE 400 MG IV SOLR
1200.0000 mg | Freq: Once | INTRAVENOUS | Status: AC
  Administered 2020-05-06: 1200 mg via INTRAVENOUS
  Filled 2020-05-05: qty 120

## 2020-05-05 MED ORDER — MAGNESIUM SULFATE 4 GM/100ML IV SOLN
4.0000 g | Freq: Once | INTRAVENOUS | Status: AC
  Administered 2020-05-05: 4 g via INTRAVENOUS
  Filled 2020-05-05: qty 100

## 2020-05-05 MED ORDER — POTASSIUM CHLORIDE CRYS ER 20 MEQ PO TBCR
40.0000 meq | EXTENDED_RELEASE_TABLET | Freq: Once | ORAL | Status: AC
  Administered 2020-05-05: 40 meq via ORAL
  Filled 2020-05-05: qty 2

## 2020-05-05 MED ORDER — SODIUM CHLORIDE 0.9 % IV BOLUS
500.0000 mL | Freq: Once | INTRAVENOUS | Status: AC
  Administered 2020-05-05: 500 mL via INTRAVENOUS

## 2020-05-05 NOTE — Progress Notes (Signed)
Pt sleeping. Declined ambulation, asking if he can go later. Pt is a night owl and generally sleeps during the day. Discussed with pt the importance of ambulation and that he can ambulate himself when he likes. Will sign off since pt sleeps during the day and is independent. [id number] Ethelda Chick CES, ACSM 1:32 PM 05/05/2020

## 2020-05-05 NOTE — Plan of Care (Signed)
?  Problem: Education: ?Goal: Knowledge of General Education information will improve ?Description: Including pain rating scale, medication(s)/side effects and non-pharmacologic comfort measures ?Outcome: Progressing ?  ?Problem: Health Behavior/Discharge Planning: ?Goal: Ability to manage health-related needs will improve ?Outcome: Progressing ?  ?Problem: Clinical Measurements: ?Goal: Ability to maintain clinical measurements within normal limits will improve ?Outcome: Progressing ?Goal: Will remain free from infection ?Outcome: Progressing ?Goal: Diagnostic test results will improve ?Outcome: Progressing ?Goal: Respiratory complications will improve ?Outcome: Progressing ?Goal: Cardiovascular complication will be avoided ?Outcome: Progressing ?  ?Problem: Activity: ?Goal: Risk for activity intolerance will decrease ?Outcome: Progressing ?  ?Problem: Nutrition: ?Goal: Adequate nutrition will be maintained ?Outcome: Progressing ?  ?Problem: Coping: ?Goal: Level of anxiety will decrease ?Outcome: Progressing ?  ?Problem: Elimination: ?Goal: Will not experience complications related to bowel motility ?Outcome: Progressing ?Goal: Will not experience complications related to urinary retention ?Outcome: Progressing ?  ?Problem: Pain Managment: ?Goal: General experience of comfort will improve ?Outcome: Progressing ?  ?Problem: Safety: ?Goal: Ability to remain free from injury will improve ?Outcome: Progressing ?  ?Problem: Skin Integrity: ?Goal: Risk for impaired skin integrity will decrease ?Outcome: Progressing ?  ?Problem: Education: ?Goal: Patient will understand all VAD equipment and how it functions ?Outcome: Progressing ?Goal: Patient will be able to verbalize current INR target range and antiplatelet therapy for discharge home ?Outcome: Progressing ?  ?Problem: Cardiac: ?Goal: LVAD will function as expected and patient will experience no clinical alarms ?Outcome: Progressing ?  ?

## 2020-05-05 NOTE — Progress Notes (Signed)
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   HM III LVAD implanted on 02/26/19 by Dr. Maren Beach under Destination Therapy criteria.  Pt sitting up in bed watching TV. States he feels great. Denies pain.   Has had 75 PI events so far today, with 1 asymptomatic LOW FLOW at 8:18. BP and VAD numbers otherwise stable. Spoke with Brittainy PA and will give 500 cc NS bolus.   ID plan as below:  1. Continue cefazolin through today  2. Oritavancin x 1 tomorrow (will give him total 3 weeks of antibiotics)  3. Available for follow up with ID outpatient if any concerns with VAD team   Vital signs: Temp: 97.1  HR: 94 Doppler Pressure: 86 Automatic BP: 100/77 (85) O2 Sat: 100% RA Wt: 122.8>123>124.1>120.8>121.4>121.6>121.9 lbs   LVAD interrogation reveals:  Speed: 5400 Flow: 3.8  Power: 3.8w PI: 4.4  Hct: 20 - do not change  Alarms: 1 asymptomatic LOW FLOW this morning Events: 75 PI events so far today   Fixed speed: 5400 Low speed limit: 5100  Drive Line: CDI. Pt's anchor is dangling of his side this morning. Drive line anchor replaced again today. May advance to every other day dressing changes using silk tape per BS nurse. Next dressing change due 05/05/20.   Labs:  LDH trend: 181>171>185>196>193>295>233  INR trend: 2.3>1.7>1.2>1.1>1.1>1.0>1.0  Anticoagulation Plan: -INR Goal: 2.0 - 2.5 -ASA Dose: none  Blood Products:  - 04/29/20>>2 units PCs  Device: N/A  Infection:  - 04/28/20 BCs>> No growth x 3 days - 04/28/20 DL culture>>rare staph aureus; final  Drips:  Heparin @ 500 units/hr  Plan/Recommendations:  1. Call VAD Coordinator if any VAD equipment or drive line issues. 2. Every other day dressing changes per BS nurse.   Alyce Pagan RN VAD Coordinator  Office: [phone]  24/7 Pager: [phone]

## 2020-05-05 NOTE — Progress Notes (Signed)
[facility]  Date of Admission:  04/28/2020      Total days of antibiotics 7  Cefazolin 3/27 >> C  Vanc/Cefepime/Metronidazole 3/25 >> 3/28          ASSESSMENT: Jon Martin is a 35 y.o. male with LVAD here for acute onset cellulitis and purulent drainage shortly following trauma to driveline with cultures indicating MSSA. Blood cultures negative. No systemic signs of infection. CT with only diffuse stranding/unorgainzed swelling around exit site. Very scant drainage described from yesterday's change - has advanced to every other day for now per VAD team. Erythema resolved and induration resolving. Will continue Cefazolin today and dose him with oritavancin tomorrow x 1. He loved the idea of having no additional pill burden at discharge.   Necessary dental extractions completed - now awaiting INR to come up to therapeutic range prior to D/C.      PLAN: 1. Continue cefazolin through today  2. Oritavancin x 1 tomorrow (will give him total 3 weeks of antibiotics)  3. Available for follow up with ID outpatient if any concerns with VAD team   Will sign off - happy to see him back should there be a change in condition or questions.    Principal Problem:   Infection associated with driveline of left ventricular assist device (LVAD) (HCC) Active Problems:   Chronic systolic heart failure (HCC)   Left ventricular assist device (LVAD) complication   Complication involving left ventricular assist device (LVAD)   Dental caries   Retained dental root   Chronic periodontitis   . pantoprazole  40 mg Oral Daily  . potassium chloride  40 mEq Oral Once  . sodium chloride flush  3 mL Intravenous Q12H  . warfarin  5 mg Oral ONCE-1600  . Warfarin - Pharmacist Dosing Inpatient   Does not apply q1600    SUBJECTIVE: Doing well today.  Very little pain since teeth out. No fevers or chills. No diarrhea or rash.    Review of Systems: Review of Systems   Constitutional: Negative for chills, fever, malaise/fatigue and weight loss.  HENT: Negative for sore throat.        No bleeding s/p extractions  Respiratory: Negative for cough and sputum production.   Cardiovascular: Negative for chest pain and leg swelling.  Gastrointestinal: Negative for abdominal pain, diarrhea and vomiting.  Genitourinary: Negative for dysuria and flank pain.  Musculoskeletal: Negative for joint pain, myalgias and neck pain.  Skin: Negative for rash.  Neurological: Negative for dizziness, tingling and headaches.  Psychiatric/Behavioral: Negative for depression and substance abuse. The patient is not nervous/anxious and does not have insomnia.     No Known Allergies   OBJECTIVE: Vitals:   05/04/20 2312 05/05/20 0253 05/05/20 0742 05/05/20 0810  BP: 106/86 106/86  100/77  Pulse: 77 82  (!) 145  Resp: 16 16  20   Temp: 98.3 F (36.8 C) 98.6 F (37 C) 98.3 F (36.8 C) 98.2 F (36.8 C)  TempSrc: Oral Oral Oral Oral  SpO2: 98% 100%  98%  Weight:  55.3 kg    Height:       Body mass index is 20.29 kg/m.  Physical Exam HENT:     Mouth/Throat:     Mouth: No oral lesions.     Dentition: Normal dentition. No dental caries.     Comments: Mouth without signs of infection s/p extractions Eyes:     General: No scleral icterus. Cardiovascular:  Rate and Rhythm: Normal rate.     Heart sounds: Normal heart sounds.     Comments: +LVAD humm Pulmonary:     Effort: Pulmonary effort is normal.     Breath sounds: Normal breath sounds.  Abdominal:     General: There is no distension.     Palpations: Abdomen is soft.     Tenderness: There is no abdominal tenderness.     Comments: Clean and dry gauze dressing.   Lymphadenopathy:     Cervical: No cervical adenopathy.  Skin:    General: Skin is warm and dry.     Findings: No rash.  Neurological:     Mental Status: He is alert and oriented to person, place, and time.     Lab Results Lab Results   Component Value Date   WBC 6.4 05/05/2020   HGB 9.1 (L) 05/05/2020   HCT 29.2 (L) 05/05/2020   MCV 98.3 05/05/2020   PLT 293 05/05/2020    Lab Results  Component Value Date   CREATININE 0.68 05/05/2020   BUN <5 (L) 05/05/2020   NA 136 05/05/2020   K 3.6 05/05/2020   CL 104 05/05/2020   CO2 26 05/05/2020    Lab Results  Component Value Date   ALT 32 03/29/2020   AST 107 (H) 03/29/2020   ALKPHOS 76 03/29/2020   BILITOT 0.5 03/29/2020     Microbiology: Recent Results (from the past 240 hour(s))  Aerobic Culture w Gram Stain (superficial specimen)     Status: None   Collection Time: 04/28/20 11:31 AM   Specimen: Wound  Result Value Ref Range Status   Specimen Description WOUND ABD  Final   Special Requests NONE  Final   Gram Stain   Final    RARE WBC PRESENT, PREDOMINANTLY MONONUCLEAR RARE GRAM POSITIVE RODS Performed at [facility], [address] [place], [place] [address]    Culture RARE STAPHYLOCOCCUS AUREUS  Final   Report Status 04/30/2020 FINAL  Final   Organism ID, Bacteria STAPHYLOCOCCUS AUREUS  Final      Susceptibility   Staphylococcus aureus - MIC*    CIPROFLOXACIN 1 SENSITIVE Sensitive     ERYTHROMYCIN >=8 RESISTANT Resistant     GENTAMICIN <=0.5 SENSITIVE Sensitive     OXACILLIN <=0.25 SENSITIVE Sensitive     TETRACYCLINE <=1 SENSITIVE Sensitive     VANCOMYCIN <=0.5 SENSITIVE Sensitive     TRIMETH/SULFA <=10 SENSITIVE Sensitive     CLINDAMYCIN RESISTANT Resistant     RIFAMPIN <=0.5 SENSITIVE Sensitive     Inducible Clindamycin POSITIVE Resistant     * RARE STAPHYLOCOCCUS AUREUS  Culture, blood (Routine X 2) w Reflex to ID Panel     Status: None   Collection Time: 04/28/20  1:03 PM   Specimen: BLOOD  Result Value Ref Range Status   Specimen Description BLOOD SITE NOT SPECIFIED  Final   Special Requests   Final    BOTTLES DRAWN AEROBIC AND ANAEROBIC Blood Culture results may not be optimal due to an excessive volume of blood received in  culture bottles   Culture   Final    NO GROWTH 5 DAYS Performed at [facility], [address] [place], [place] [address]    Report Status 05/03/2020 FINAL  Final  SARS CORONAVIRUS 2 (TAT 6-24 HRS) Nasopharyngeal     Status: None   Collection Time: 04/28/20  3:26 PM   Specimen: Nasopharyngeal  Result Value Ref Range Status   SARS Coronavirus  2 NEGATIVE NEGATIVE Final    Comment: (NOTE) SARS-CoV-2 target nucleic acids are NOT DETECTED.  The SARS-CoV-2 RNA is generally detectable in upper and lower respiratory specimens during the acute phase of infection. Negative results do not preclude SARS-CoV-2 infection, do not rule out co-infections with other pathogens, and should not be used as the sole basis for treatment or other patient management decisions. Negative results must be combined with clinical observations, patient history, and epidemiological information. The expected result is Negative.  Fact Sheet for Patients: HairSlick.no  Fact Sheet for Healthcare Providers: quierodirigir.com  This test is not yet approved or cleared by the [place] FDA and  has been authorized for detection and/or diagnosis of SARS-CoV-2 by FDA under an Emergency Use Authorization (EUA). This EUA will remain  in effect (meaning this test can be used) for the duration of the COVID-19 declaration under Se ction 564(b)(1) of the Act, 21 U.S.C. section 360bbb-3(b)(1), unless the authorization is terminated or revoked sooner.  Performed at [facility], [address] [place], [place] [address]   MRSA PCR Screening     Status: None   Collection Time: 04/28/20 10:59 PM   Specimen: Nasopharyngeal  Result Value Ref Range Status   MRSA by PCR NEGATIVE NEGATIVE Final    Comment:        The GeneXpert MRSA Assay (FDA approved for NASAL specimens only), is one component of a comprehensive MRSA colonization surveillance program.  It is not intended to diagnose MRSA infection nor to guide or monitor treatment for MRSA infections. Performed at [facility], [address], [place], [place] [address]     Rexene Alberts, MSN, NP-C [facility]  [email or web address] Pager: [phone] Office: [phone] [facility] Main Line: [phone]

## 2020-05-05 NOTE — Progress Notes (Addendum)
Patient ID: Donal Lynam, male   DOB: 07-10-1985, 35 y.o.   MRN: [id number]   Advanced Heart Failure VAD Team Note  PCP-Cardiologist: No primary care provider on file.   Subjective:     HM3 LVAD admitted w/ Drive-line infection.  - Wound culture with S aureus. Blood cx NGTD - 3/26 abx narrowed to cefazolin 2gm IV Q 8hr. ID following  -3/28 CT abd/pelvis soft tissue no deep infection. Oral Surgeon consulted.  -3/31 s/p extractions of teeth #5 and 15   Feels well today. No significant mouth pain. Tolerating orals ok.   Coumadin resumed last PM. On IV heparin.  INR 1.0.  LDH 233. Hgb stable 9.1   Wt stable at 121 lb.   Had 1 low flow this AM ~8:18 but VAD did not alarm. Multiple PI events. MAPs in the 80s   LVAD INTERROGATION:   HeartMate 3 LVAD:   Flow 3.4  liters/min, speed 5400, power 3.9  PI 7.4  Multiple PI events. 1 low flow   Objective:    Vital Signs:   Temp:  [97.1 F (36.2 C)-98.6 F (37 C)] 98.2 F (36.8 C) (04/01 0810) Pulse Rate:  [77-145] 145 (04/01 0810) Resp:  [12-20] 20 (04/01 0810) BP: (76-111)/(58-86) 100/77 (04/01 0810) SpO2:  [98 %-100 %] 98 % (04/01 0810) Weight:  [55.2 kg-55.3 kg] 55.3 kg (04/01 0253) Last BM Date: 05/03/20 Mean arterial Pressure 80-90s   Intake/Output:   Intake/Output Summary (Last 24 hours) at 05/05/2020 0842 Last data filed at 05/05/2020 0400 Gross per 24 hour  Intake 510 ml  Output 830 ml  Net -320 ml     Physical Exam   Physical Exam: GENERAL: well appearing No acute distress. Sitting up in bed  HEENT: normal  NECK: Supple, No JVD 2+ bilaterally, no bruits.  No lymphadenopathy or thyromegaly appreciated.   CARDIAC:  Mechanical heart sounds with LVAD hum present.  LUNGS:  Clear to auscultation bilaterally. No wheezing ABDOMEN:  Soft, round, nontender, positive bowel sounds x4.     LVAD exit site: well-healed and incorporated.  Dressing dry and intact.  No erythema or drainage.  Stabilization device present and  accurately applied.  Driveline dressing is being changed daily per sterile technique. EXTREMITIES:  Warm and dry, no cyanosis, clubbing, rash or edema  NEUROLOGIC:  Alert and oriented x 3.    No aphasia.  No dysarthria.  Affect pleasant.        Telemetry   NSR 70ss currently w/ runs of sinus tach 120s-140s  personally reviewed.   Labs   Basic Metabolic Panel: Recent Labs  Lab 04/30/20 0308 05/01/20 0034 05/02/20 0047 05/03/20 0159 05/04/20 0047 05/05/20 0037  NA 135 135 136 136  --  136  K 4.0 3.5 3.6 3.4*  --  3.6  CL 104 104 104 105  --  104  CO2 24 25 26 24   --  26  GLUCOSE 105* 103* 117* 111*  --  127*  BUN 5* 5* 5* <5*  --  <5*  CREATININE 0.74 0.91 0.72 0.70  --  0.68  CALCIUM 9.3 8.9 8.6* 8.9  --  8.8*  MG  --  1.2* 2.1 1.7 1.9 1.7    Liver Function Tests: No results for input(s): AST, ALT, ALKPHOS, BILITOT, PROT, ALBUMIN in the last 168 hours. No results for input(s): LIPASE, AMYLASE in the last 168 hours. No results for input(s): AMMONIA in the last 168 hours.  CBC: Recent Labs  Lab 04/30/20 0308 05/01/20  0034 05/02/20 0047 05/03/20 0159 05/05/20 0037  WBC 6.2 7.2 7.3 8.3 6.4  HGB 10.5* 10.0* 10.0* 9.4* 9.1*  HCT 31.5* 31.2* 30.0* 28.6* 29.2*  MCV 95.2 97.2 96.2 96.9 98.3  PLT 126* 146* 185 221 293    INR: Recent Labs  Lab 05/01/20 0034 05/02/20 0047 05/03/20 0159 05/04/20 0047 05/05/20 0037  INR 1.2 1.1 1.1 1.0 1.0    Other results:  EKG:    Imaging   No results found.   Medications:     Scheduled Medications: . pantoprazole  40 mg Oral Daily  . sodium chloride flush  3 mL Intravenous Q12H  . Warfarin - Pharmacist Dosing Inpatient   Does not apply q1600    Infusions: . sodium chloride    .  ceFAZolin (ANCEF) IV 2 g (05/05/20 0021)  . heparin 500 Units/hr (05/05/20 0810)  . lactated ringers 10 mL/hr at 05/04/20 0921  . lactated ringers 10 mL/hr at 05/04/20 1136    PRN Medications: sodium chloride, acetaminophen,  ondansetron (ZOFRAN) IV   Patient Profile   35 y/o male w/ chronic systolic HF s/p HM3 LVAD admitted w/ staph aureus drive-line infection   Assessment/Plan:    1. 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 -  Admitted for IV abx vancomycin/cefepime/Flagyl - Driveline culture with S aureus. Blood cx NGTD  - 3/27 abx narrowed to cefazolin 2gm IV Q 8hr, then one dose of IV oritiavancin prior to d/c  - CT abd/pelvis- soft tissue infection.  - Thank you! ID Team.   2.  Chronic Systolic HF due to NICM - Echo EF 10% with biventricular failure - HM-3 VAD implant 02/26/19 - Echo 7/21 EF 40-45% - No bb or ARB with previous dehydration/RV failure.  - VAD interrogated personally. Multiple PI events. Suspect he is a little dry, will give 500 cc IVF bolus x 1  - INR 1.0. LDL 233  - Continue  IV heparin + warfarin.  - Discussed with pharmacy team.   3. Anemia - Baseline hgb has been upper 9's, admission hgb 7.7 => 6.1.  However, hgb up to 10.5 after 2 units PRCs and stool was heme negative.  Does not appear to be actively GI bleeding, ?initial drop dilutional with IVF at admission.   - Hgb stable at 9.1   4. F/E/N - We continue to worry about his nutritional status after his mother died.  5. Broken Teeth - Dental Team consulted - Panorex completed. 1 broken tooth.  - 3/31 s/p extractions of teeth #5 and 15  - Appreciate dental surgery   6. Hypokalemia/ Hypomagnesemia  - K 3.6, Mg 1.7 - will supplement d/w pharmacy   I reviewed the LVAD parameters from today, and compared the results to the patient's prior recorded data.  No programming changes were made.  The LVAD is functioning within specified parameters.  The patient performs LVAD self-test daily.  LVAD interrogation was negative for any significant power changes, alarms or PI events/speed drops.  LVAD equipment check completed and is in good working order.  Back-up equipment present.    LVAD education done on emergency procedures and precautions and reviewed exit site care.  Length of Stay: [address] [place] 05/05/2020, 8:42 AM  VAD Team --- VAD ISSUES ONLY--- Pager [phone] (7am - 7am)  Advanced Heart Failure Team  Pager [phone] (M-F; 7a - 5p)  Please contact [facility] for night-coverage after hours (5p -7a ) and weekends  on [email or web address]  Patient seen and examined with the above-signed Advanced Practice Provider and/or Housestaff. I personally reviewed laboratory data, imaging studies and relevant notes. I independently examined the patient and formulated the important aspects of the plan. I have edited the note to reflect any of my changes or salient points. I have personally discussed the plan with the patient and/or family.  Feels better today. No significant mouth pain. No bleeding. On low-dose heparin. Remains on IV abx.   1 low flow this am. Hgb 9.4 -> 9.1 LDH 233  General:  NAD.  HEENT: normal  Neck: supple. JVP not elevated.  Carotids 2+ bilat; no bruits. No lymphadenopathy or thryomegaly appreciated. Cor: LVAD hum.  Lungs: Clear. Abdomen: soft, nontender, non-distended. No hepatosplenomegaly. No bruits or masses. Good bowel sounds. Driveline site clean. Anchor in place.  Extremities: no cyanosis, clubbing, rash. Warm no edema  Neuro: alert & oriented x 3. No focal deficits. Moves all 4 without problem   Can advance diet today. Continue IV abx for DL infection. Continue heparin/warfarin. VAD interrogated personally. Parameters stable. Encourage po intake. Can give IVF as needed or if low flows recur.  Home when INR >= 1.8   Arvilla Meres, MD  9:29 AM

## 2020-05-05 NOTE — Progress Notes (Signed)
ANTICOAGULATION CONSULT NOTE  Pharmacy Consult for warfarin + heparin Indication: LVAD  No Known Allergies  Patient Measurements: Height: 5\' 5"  (165.1 cm) Weight: 55.3 kg (121 lb 14.6 oz) IBW/kg (Calculated) : 61.5   Vital Signs: Temp: 98.2 F (36.8 C) (04/01 0810) Temp Source: Oral (04/01 0810) BP: 100/77 (04/01 0810) Pulse Rate: 145 (04/01 0810)  Labs: Recent Labs    05/03/20 0159 05/04/20 0047 05/04/20 2158 05/05/20 0037  HGB 9.4*  --   --  9.1*  HCT 28.6*  --   --  29.2*  PLT 221  --   --  293  LABPROT 13.6 13.0  --  13.1  INR 1.1 1.0  --  1.0  HEPARINUNFRC <0.10* <0.10* <0.10* <0.10*  CREATININE 0.70  --   --  0.68    Estimated Creatinine Clearance: 101.8 mL/min (by C-G formula based on SCr of 0.68 mg/dL).   Medical History: Past Medical History:  Diagnosis Date  . Dilated cardiomyopathy (HCC)   . Dilated cardiomyopathy (HCC) 02/2019  . Polysubstance abuse (HCC)     Assessment: 81 yom presenting with concern for driveline infection - hx HM3 implanted on 02/26/19 - on warfarin PTA. PTA regimen is 2.5 mg daily.   INR remains subtherapeutic at 1.0. He is now s/p dental extractions yesterday. No overt bleeding noted. Hgb has stabilized 9-10s, pltc wnl. LDH 193>295 > 233  Low, fixed-dose heparin gtt started 3/28 (resumed after OR yesterday) and heparin level remains undetectable.    Goal of Therapy:  Heparin level <0.3 INR 2-2.5 Monitor platelets by anticoagulation protocol: Yes   Plan:  Continue IV heparin at 500 units/hr.  Will not titrate for now. Coumadin 5 mg po x 1 tonight. Daily heparin level, CBC, and INR.  4/28, Reece Leader, BCCP Clinical Pharmacist  05/05/2020 8:58 AM   [facility] pharmacy phone numbers are listed on [email or web address]

## 2020-05-06 DIAGNOSIS — T827XXA Infection and inflammatory reaction due to other cardiac and vascular devices, implants and grafts, initial encounter: Secondary | ICD-10-CM | POA: Diagnosis not present

## 2020-05-06 DIAGNOSIS — I5022 Chronic systolic (congestive) heart failure: Secondary | ICD-10-CM | POA: Diagnosis not present

## 2020-05-06 LAB — BASIC METABOLIC PANEL
Anion gap: 6 (ref 5–15)
BUN: 5 mg/dL — ABNORMAL LOW (ref 6–20)
CO2: 26 mmol/L (ref 22–32)
Calcium: 9 mg/dL (ref 8.9–10.3)
Chloride: 105 mmol/L (ref 98–111)
Creatinine, Ser: 0.79 mg/dL (ref 0.61–1.24)
GFR, Estimated: >60 mL/min (ref >60)
Glucose, Bld: 98 mg/dL (ref 70–99)
Potassium: 3.7 mmol/L (ref 3.5–5.1)
Sodium: 137 mmol/L (ref 135–145)

## 2020-05-06 LAB — CBC
HCT: 28.9 % — ABNORMAL LOW (ref 39.0–52.0)
Hemoglobin: 9 g/dL — ABNORMAL LOW (ref 13.0–17.0)
MCH: 30.5 pg (ref 26.0–34.0)
MCHC: 31.1 g/dL (ref 30.0–36.0)
MCV: 98 fL (ref 80.0–100.0)
Platelets: 277 10*3/uL (ref 150–400)
RBC: 2.95 MIL/uL — ABNORMAL LOW (ref 4.22–5.81)
RDW: 20.8 % — ABNORMAL HIGH (ref 11.5–15.5)
WBC: 5.2 10*3/uL (ref 4.0–10.5)
nRBC: 0 % (ref 0.0–0.2)

## 2020-05-06 LAB — LACTATE DEHYDROGENASE: LDH: 273 U/L — ABNORMAL HIGH (ref 98–192)

## 2020-05-06 LAB — PROTIME-INR
INR: 1 (ref 0.8–1.2)
Prothrombin Time: 13.1 seconds (ref 11.4–15.2)

## 2020-05-06 LAB — HEPARIN LEVEL (UNFRACTIONATED): Heparin Unfractionated: <0.1 IU/mL — ABNORMAL LOW (ref 0.30–0.70)

## 2020-05-06 LAB — MAGNESIUM: Magnesium: 2.2 mg/dL (ref 1.7–2.4)

## 2020-05-06 MED ORDER — LOSARTAN POTASSIUM 25 MG PO TABS
25.0000 mg | ORAL_TABLET | Freq: Every day | ORAL | Status: DC
  Administered 2020-05-06 – 2020-05-25 (×20): 25 mg via ORAL
  Filled 2020-05-06 (×19): qty 1

## 2020-05-06 MED ORDER — POTASSIUM CHLORIDE CRYS ER 20 MEQ PO TBCR
40.0000 meq | EXTENDED_RELEASE_TABLET | Freq: Once | ORAL | Status: AC
  Administered 2020-05-06: 40 meq via ORAL
  Filled 2020-05-06: qty 2

## 2020-05-06 MED ORDER — SODIUM CHLORIDE 0.9 % IV BOLUS
1000.0000 mL | Freq: Once | INTRAVENOUS | Status: AC
  Administered 2020-05-06: 1000 mL via INTRAVENOUS

## 2020-05-06 MED ORDER — WARFARIN SODIUM 7.5 MG PO TABS
7.5000 mg | ORAL_TABLET | Freq: Once | ORAL | Status: AC
  Administered 2020-05-06: 7.5 mg via ORAL
  Filled 2020-05-06: qty 1

## 2020-05-06 NOTE — Progress Notes (Signed)
Patient ID: Jon Martin, male   DOB: 1985/03/25, 35 y.o.   MRN: [id number]   Advanced Heart Failure VAD Team Note  PCP-Cardiologist: No primary care provider on file.   Subjective:     HM3 LVAD admitted w/ Drive-line infection.  - Wound culture with S aureus. Blood cx NGTD - 3/26 abx narrowed to cefazolin 2gm IV Q 8hr. ID following  - 3/28 CT abd/pelvis soft tissue no deep infection. Oral Surgeon consulted.  - 3/31 s/p extractions of teeth #5 and 15   Feels well today. No significant mouth pain. Tolerating orals ok.   On warfarin + IV heparin.  INR 1.0.  LDH 273. Hgb stable 9.   Low flow alarms last night, got 1 L IVF and low flows have stopped since 4 am.  MAP 90s-100s.   LVAD INTERROGATION:   HeartMate 3 LVAD:   Flow 3.5 liters/min, speed 5400, power 3.8  PI 5.6  Multiple PI events. Multiple low flows, last at 4 am  Objective:    Vital Signs:   Temp:  [98.3 F (36.8 C)-98.9 F (37.2 C)] 98.3 F (36.8 C) (04/02 0300) Pulse Rate:  [68-89] 86 (04/02 0300) Resp:  [16-18] 16 (04/02 0300) BP: (88-118)/(71-94) 114/90 (04/02 0300) SpO2:  [99 %] 99 % (04/02 0300) Weight:  [55.9 kg] 55.9 kg (04/02 0300) Last BM Date: 05/03/20 Mean arterial Pressure 90s-100s   Intake/Output:   Intake/Output Summary (Last 24 hours) at 05/06/2020 0948 Last data filed at 05/06/2020 0530 Gross per 24 hour  Intake 1323.62 ml  Output 950 ml  Net 373.62 ml     Physical Exam   General: Well appearing this am. NAD.  HEENT: Normal. Neck: Supple, JVP 7-8 cm. Carotids OK.  Cardiac:  Mechanical heart sounds with LVAD hum present.  Lungs:  CTAB, normal effort.  Abdomen:  NT, ND, no HSM. No bruits or masses. +BS  LVAD exit site: Well-healed and incorporated. Dressing dry and intact. No erythema or drainage. Stabilization device present and accurately applied. Driveline dressing changed daily per sterile technique. Extremities:  Warm and dry. No cyanosis, clubbing, rash, or edema.  Neuro:  Alert &  oriented x 3. Cranial nerves grossly intact. Moves all 4 extremities w/o difficulty. Affect pleasant    Telemetry   Sinus tachy 100s  personally reviewed.   Labs   Basic Metabolic Panel: Recent Labs  Lab 05/01/20 0034 05/02/20 0047 05/03/20 0159 05/04/20 0047 05/05/20 0037 05/06/20 0051  NA 135 136 136  --  136 137  K 3.5 3.6 3.4*  --  3.6 3.7  CL 104 104 105  --  104 105  CO2 25 26 24   --  26 26  GLUCOSE 103* 117* 111*  --  127* 98  BUN 5* 5* <5*  --  <5* 5*  CREATININE 0.91 0.72 0.70  --  0.68 0.79  CALCIUM 8.9 8.6* 8.9  --  8.8* 9.0  MG 1.2* 2.1 1.7 1.9 1.7 2.2    Liver Function Tests: No results for input(s): AST, ALT, ALKPHOS, BILITOT, PROT, ALBUMIN in the last 168 hours. No results for input(s): LIPASE, AMYLASE in the last 168 hours. No results for input(s): AMMONIA in the last 168 hours.  CBC: Recent Labs  Lab 05/01/20 0034 05/02/20 0047 05/03/20 0159 05/05/20 0037 05/06/20 0051  WBC 7.2 7.3 8.3 6.4 5.2  HGB 10.0* 10.0* 9.4* 9.1* 9.0*  HCT 31.2* 30.0* 28.6* 29.2* 28.9*  MCV 97.2 96.2 96.9 98.3 98.0  PLT 146* 185 221 293  277    INR: Recent Labs  Lab 05/02/20 0047 05/03/20 0159 05/04/20 0047 05/05/20 0037 05/06/20 0051  INR 1.1 1.1 1.0 1.0 1.0    Other results:  EKG:    Imaging   No results found.   Medications:     Scheduled Medications: . losartan  25 mg Oral Daily  . pantoprazole  40 mg Oral Daily  . sodium chloride flush  3 mL Intravenous Q12H  . Warfarin - Pharmacist Dosing Inpatient   Does not apply q1600    Infusions: . sodium chloride    . heparin 500 Units/hr (05/06/20 0530)  . lactated ringers 10 mL/hr at 05/04/20 0921  . lactated ringers 10 mL/hr at 05/04/20 1136  . oritavancin (ORBACTIV) IVPB      PRN Medications: sodium chloride, acetaminophen, ondansetron (ZOFRAN) IV   Patient Profile   35 y/o male w/ chronic systolic HF s/p HM3 LVAD admitted w/ staph aureus drive-line infection   Assessment/Plan:     1. 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 -  Admitted for IV abx vancomycin/cefepime/Flagyl - Driveline culture with S aureus. Blood cx NGTD  - 3/27 abx narrowed to cefazolin 2gm IV Q 8hr => stopped yesterday and getting 1 dose IV oritavancin today.   - CT abd/pelvis- soft tissue infection.   2.  Chronic Systolic HF due to NICM - Echo EF 10% with biventricular failure - HM-3 VAD implant 02/26/19 - Echo 7/21 EF 40-45% - No bb or ARB with previous dehydration/RV failure.  - VAD interrogated personally. Low flows overnight, resolved with 1 L IV fluid (none since 4 am).  May be due to dehydration but in 12/21, there was concern for outflow graft twist.  Need to watch closely.  LDH fairly stable.  MAP also elevated which may contribute, will add losartan 25 mg daily today.  - INR 1.0. LDL 273. Continue  IV heparin + warfarin until INR > 1.8.  - Discussed with pharmacy team.   3. Anemia - Baseline hgb has been upper 9's, admission hgb 7.7 => 6.1.  However, hgb up to 10.5 after 2 units PRCs and stool was heme negative.  Does not appear to be actively GI bleeding, ?initial drop dilutional with IVF at admission.   - Hgb stable at 9  4. F/E/N - We continue to worry about his nutritional status after his mother died.  5. Broken Teeth - Dental Team consulted - Panorex completed. 1 broken tooth.  - 3/31 s/p extractions of teeth #5 and 15  - Appreciate dental surgery   6. Hypokalemia/ Hypomagnesemia  - K 3.7, supplement.   I reviewed the LVAD parameters from today, and compared the results to the patient's prior recorded data.  No programming changes were made.  The LVAD is functioning within specified parameters.  The patient performs LVAD self-test daily.  LVAD interrogation was negative for any significant power changes, alarms or PI events/speed drops.  LVAD equipment check completed and is in good working order.  Back-up equipment present.    LVAD education done on emergency procedures and precautions and reviewed exit site care.  Length of Stay: 8  Marca Ancona, MD 05/06/2020, 9:48 AM  VAD Team --- VAD ISSUES ONLY--- Pager [phone] (7am - 7am)  Advanced Heart Failure Team  Pager [phone] (M-F; 7a - 5p)  Please contact [facility] for night-coverage after hours (5p -7a ) and weekends on [email or web address]

## 2020-05-06 NOTE — Plan of Care (Signed)
?  Problem: Education: ?Goal: Knowledge of General Education information will improve ?Description: Including pain rating scale, medication(s)/side effects and non-pharmacologic comfort measures ?Outcome: Progressing ?  ?Problem: Health Behavior/Discharge Planning: ?Goal: Ability to manage health-related needs will improve ?Outcome: Progressing ?  ?Problem: Clinical Measurements: ?Goal: Ability to maintain clinical measurements within normal limits will improve ?Outcome: Progressing ?Goal: Will remain free from infection ?Outcome: Progressing ?Goal: Diagnostic test results will improve ?Outcome: Progressing ?Goal: Respiratory complications will improve ?Outcome: Progressing ?Goal: Cardiovascular complication will be avoided ?Outcome: Progressing ?  ?Problem: Activity: ?Goal: Risk for activity intolerance will decrease ?Outcome: Progressing ?  ?Problem: Nutrition: ?Goal: Adequate nutrition will be maintained ?Outcome: Progressing ?  ?Problem: Coping: ?Goal: Level of anxiety will decrease ?Outcome: Progressing ?  ?Problem: Elimination: ?Goal: Will not experience complications related to bowel motility ?Outcome: Progressing ?Goal: Will not experience complications related to urinary retention ?Outcome: Progressing ?  ?Problem: Pain Managment: ?Goal: General experience of comfort will improve ?Outcome: Progressing ?  ?Problem: Safety: ?Goal: Ability to remain free from injury will improve ?Outcome: Progressing ?  ?Problem: Skin Integrity: ?Goal: Risk for impaired skin integrity will decrease ?Outcome: Progressing ?  ?Problem: Education: ?Goal: Patient will understand all VAD equipment and how it functions ?Outcome: Progressing ?Goal: Patient will be able to verbalize current INR target range and antiplatelet therapy for discharge home ?Outcome: Progressing ?  ?Problem: Cardiac: ?Goal: LVAD will function as expected and patient will experience no clinical alarms ?Outcome: Progressing ?  ?

## 2020-05-06 NOTE — Progress Notes (Addendum)
Received page from bedside RN stating pt having low flows. While on phone with bedside RN I could hear VAD alarming frequently.   Speed: 5400 Flow: 3.1  Power: 3.8 w PI: 7.2-9.2  Doppler: 90 Auto BP: 114/90 (99)  RN reports patient was laying on his side when first low flow occurred at midnight, but has been sitting up in bed watching TV since. Pt denies complaints.   Morning labs were drawn around 0100. LDH 273 (has trended up slightly but patient had dental extractions 2 days ago) Hgb stable at 9.0 (no signs or symptoms of bleeding per bedside RN) Platelets stable at 277. INR remains 1.0 on Heparin drip.   Instructed to start 1 liter NS bolus. Dr Shirlee Latch aware of the above, and agrees with NS bolus. Instructed RN to call me back if low flows continue or if flows drop <2.0 during low flow. Pt has history of possible outflow graft twist in December 2021. Low threshold to obtain CT angio if low flows continue.    Alyce Pagan RN VAD Coordinator  Office: [phone]  24/7 Pager: [phone]    Addendum: Sherron Monday with bedside RN at [phone]. IV fluid bolus infusing. Pt had 1 low flow when bolus began, but has not had any since. Will continue to closely monitor.

## 2020-05-06 NOTE — Progress Notes (Signed)
ANTICOAGULATION CONSULT NOTE  Pharmacy Consult for warfarin + heparin Indication: LVAD  No Known Allergies  Patient Measurements: Height: 5\' 5"  (165.1 cm) Weight: 55.9 kg (123 lb 3.8 oz) IBW/kg (Calculated) : 61.5   Vital Signs: Temp: 98.3 F (36.8 C) (04/02 0300) Temp Source: Oral (04/02 0300) BP: 114/90 (04/02 0300) Pulse Rate: 86 (04/02 0300)  Labs: Recent Labs    05/04/20 0047 05/04/20 2158 05/05/20 0037 05/06/20 0051  HGB  --   --  9.1* 9.0*  HCT  --   --  29.2* 28.9*  PLT  --   --  293 277  LABPROT 13.0  --  13.1 13.1  INR 1.0  --  1.0 1.0  HEPARINUNFRC <0.10* <0.10* <0.10* <0.10*  CREATININE  --   --  0.68 0.79    Estimated Creatinine Clearance: 102.9 mL/min (by C-G formula based on SCr of 0.79 mg/dL).   Medical History: Past Medical History:  Diagnosis Date  . Dilated cardiomyopathy (HCC)   . Dilated cardiomyopathy (HCC) 02/2019  . Polysubstance abuse (HCC)     Assessment: 60 yom presenting with concern for driveline infection - hx HM3 implanted on 02/26/19 - on warfarin PTA. PTA regimen is 2.5 mg daily. He is now s/p dental extractions on 3/31.  INR remains subtherapeutic at 1.0. No overt bleeding noted. Eating very well. Hgb has stabilized 9-10s, pltc wnl. LDH trending up 193>295 > 233>273.  Low, fixed-dose heparin gtt started 3/28 (resumed after OR 3/31) and heparin level remains undetectable.    Goal of Therapy:  Heparin level <0.3 INR 2-2.5 Monitor platelets by anticoagulation protocol: Yes   Plan:  Continue IV heparin at 500 units/hr.  Will not titrate for now. Coumadin 7.5 mg po x 1 tonight. Daily heparin level, CBC, and INR.  4/31, PharmD, BCPS PGY2 Cardiology Pharmacy Resident Phone: [phone] 05/06/2020  12:35 PM  Please check [email or web address] for unit-specific pharmacy phone numbers.

## 2020-05-06 NOTE — Progress Notes (Signed)
Pt having low flow alarms. Vad coordinator notified. New orders received, will continue to monitor.

## 2020-05-07 DIAGNOSIS — D649 Anemia, unspecified: Secondary | ICD-10-CM | POA: Diagnosis not present

## 2020-05-07 DIAGNOSIS — I5022 Chronic systolic (congestive) heart failure: Secondary | ICD-10-CM | POA: Diagnosis not present

## 2020-05-07 DIAGNOSIS — T827XXA Infection and inflammatory reaction due to other cardiac and vascular devices, implants and grafts, initial encounter: Secondary | ICD-10-CM | POA: Diagnosis not present

## 2020-05-07 LAB — LACTATE DEHYDROGENASE: LDH: 251 U/L — ABNORMAL HIGH (ref 98–192)

## 2020-05-07 LAB — CBC
HCT: 30.6 % — ABNORMAL LOW (ref 39.0–52.0)
Hemoglobin: 9.6 g/dL — ABNORMAL LOW (ref 13.0–17.0)
MCH: 31.1 pg (ref 26.0–34.0)
MCHC: 31.4 g/dL (ref 30.0–36.0)
MCV: 99 fL (ref 80.0–100.0)
Platelets: 372 10*3/uL (ref 150–400)
RBC: 3.09 MIL/uL — ABNORMAL LOW (ref 4.22–5.81)
RDW: 21 % — ABNORMAL HIGH (ref 11.5–15.5)
WBC: 6.9 10*3/uL (ref 4.0–10.5)
nRBC: 0 % (ref 0.0–0.2)

## 2020-05-07 LAB — BASIC METABOLIC PANEL
Anion gap: 7 (ref 5–15)
BUN: <5 mg/dL — ABNORMAL LOW (ref 6–20)
CO2: 25 mmol/L (ref 22–32)
Calcium: 9 mg/dL (ref 8.9–10.3)
Chloride: 104 mmol/L (ref 98–111)
Creatinine, Ser: 0.88 mg/dL (ref 0.61–1.24)
GFR, Estimated: >60 mL/min (ref >60)
Glucose, Bld: 116 mg/dL — ABNORMAL HIGH (ref 70–99)
Potassium: 3.3 mmol/L — ABNORMAL LOW (ref 3.5–5.1)
Sodium: 136 mmol/L (ref 135–145)

## 2020-05-07 LAB — HEPARIN LEVEL (UNFRACTIONATED)
Heparin Unfractionated: <0.1 IU/mL — ABNORMAL LOW (ref 0.30–0.70)
Heparin Unfractionated: 0.17 IU/mL — ABNORMAL LOW (ref 0.30–0.70)

## 2020-05-07 LAB — MAGNESIUM: Magnesium: 1.6 mg/dL — ABNORMAL LOW (ref 1.7–2.4)

## 2020-05-07 LAB — PROTIME-INR
INR: 1.3 — ABNORMAL HIGH (ref 0.8–1.2)
Prothrombin Time: 15.8 seconds — ABNORMAL HIGH (ref 11.4–15.2)

## 2020-05-07 MED ORDER — POTASSIUM CHLORIDE CRYS ER 20 MEQ PO TBCR
40.0000 meq | EXTENDED_RELEASE_TABLET | Freq: Four times a day (QID) | ORAL | Status: AC
  Administered 2020-05-07 (×2): 40 meq via ORAL
  Filled 2020-05-07 (×2): qty 2

## 2020-05-07 MED ORDER — POTASSIUM CHLORIDE IN NACL 40-0.9 MEQ/L-% IV SOLN
INTRAVENOUS | Status: AC
  Filled 2020-05-07: qty 1000

## 2020-05-07 MED ORDER — WARFARIN SODIUM 5 MG PO TABS
5.0000 mg | ORAL_TABLET | Freq: Once | ORAL | Status: AC
  Administered 2020-05-07: 5 mg via ORAL
  Filled 2020-05-07: qty 1

## 2020-05-07 MED ORDER — MAGNESIUM SULFATE 4 GM/100ML IV SOLN
4.0000 g | Freq: Once | INTRAVENOUS | Status: AC
  Administered 2020-05-07: 4 g via INTRAVENOUS
  Filled 2020-05-07: qty 100

## 2020-05-07 NOTE — Progress Notes (Signed)
Heart rate goes up to 130's--160's on movement down  to 90's on bedrest.

## 2020-05-07 NOTE — Progress Notes (Addendum)
Patient ID: Jon Martin, male   DOB: December 08, 1985, 35 y.o.   MRN: [id number]   Advanced Heart Failure VAD Team Note  PCP-Cardiologist: No primary care provider on file.   Subjective:     HM3 LVAD admitted w/ Drive-line infection.  - Wound culture with S aureus. Blood cx NGTD - 3/26 abx narrowed to cefazolin 2gm IV Q 8hr. ID following  - 3/28 CT abd/pelvis soft tissue no deep infection. Oral Surgeon consulted.  - 3/31 s/p extractions of teeth #5 and 15  - 3/31 Low flow alarms last night, got 1 L IVF and low flows have stopped  No complaints. No further low flows after IVF. Mouth pain resolved.  LVAD INTERROGATION:   HeartMate 3 LVAD:   Flow 3.5 liters/min, speed 5400, power 4.0  PI 5.8  VAD interrogated personally. Parameters stable.  Objective:    Vital Signs:   Temp:  [97.9 F (36.6 C)-98.5 F (36.9 C)] 98.3 F (36.8 C) (04/03 0800) Pulse Rate:  [76-93] 85 (04/03 0800) Resp:  [16-19] 17 (04/03 0800) BP: (96-113)/(68-90) 105/81 (04/03 0300) SpO2:  [98 %-100 %] 98 % (04/03 0800) Weight:  [55.7 kg] 55.7 kg (04/03 0300) Last BM Date: 05/03/20 Mean arterial Pressure 90s  Intake/Output:   Intake/Output Summary (Last 24 hours) at 05/07/2020 0919 Last data filed at 05/07/2020 0800 Gross per 24 hour  Intake 245 ml  Output 1100 ml  Net -855 ml     Physical Exam   General:  NAD.  HEENT: normal  Neck: supple. JVP not elevated.  Carotids 2+ bilat; no bruits. No lymphadenopathy or thryomegaly appreciated. Cor: LVAD hum.  Lungs: Clear. Abdomen: soft, nontender, non-distended. No hepatosplenomegaly. No bruits or masses. Good bowel sounds. Driveline site clean. Anchor in place.  Extremities: no cyanosis, clubbing, rash. Warm no edema  Neuro: alert & oriented x 3. No focal deficits. Moves all 4 without problem    Telemetry   Sinus 80-110  HR up to 150 with minimal activity Personally reviewed  Labs   Basic Metabolic Panel: Recent Labs  Lab 05/02/20 0047 05/03/20 0159  05/04/20 0047 05/05/20 0037 05/06/20 0051 05/07/20 0053  NA 136 136  --  136 137 136  K 3.6 3.4*  --  3.6 3.7 3.3*  CL 104 105  --  104 105 104  CO2 26 24  --  26 26 25   GLUCOSE 117* 111*  --  127* 98 116*  BUN 5* <5*  --  <5* 5* <5*  CREATININE 0.72 0.70  --  0.68 0.79 0.88  CALCIUM 8.6* 8.9  --  8.8* 9.0 9.0  MG 2.1 1.7 1.9 1.7 2.2 1.6*    Liver Function Tests: No results for input(s): AST, ALT, ALKPHOS, BILITOT, PROT, ALBUMIN in the last 168 hours. No results for input(s): LIPASE, AMYLASE in the last 168 hours. No results for input(s): AMMONIA in the last 168 hours.  CBC: Recent Labs  Lab 05/02/20 0047 05/03/20 0159 05/05/20 0037 05/06/20 0051 05/07/20 0053  WBC 7.3 8.3 6.4 5.2 6.9  HGB 10.0* 9.4* 9.1* 9.0* 9.6*  HCT 30.0* 28.6* 29.2* 28.9* 30.6*  MCV 96.2 96.9 98.3 98.0 99.0  PLT 185 221 293 277 372    INR: Recent Labs  Lab 05/03/20 0159 05/04/20 0047 05/05/20 0037 05/06/20 0051 05/07/20 0053  INR 1.1 1.0 1.0 1.0 1.3*    Other results:    Imaging   No results found.   Medications:     Scheduled Medications: . losartan  25  mg Oral Daily  . pantoprazole  40 mg Oral Daily  . potassium chloride  40 mEq Oral Q6H  . sodium chloride flush  3 mL Intravenous Q12H  . Warfarin - Pharmacist Dosing Inpatient   Does not apply q1600    Infusions: . sodium chloride    . heparin 500 Units/hr (05/07/20 0800)  . lactated ringers 10 mL/hr at 05-06-20 0921  . lactated ringers 10 mL/hr at 06-May-2020 1136  . magnesium sulfate bolus IVPB 4 g (05/07/20 0904)    PRN Medications: sodium chloride, acetaminophen, ondansetron (ZOFRAN) IV   Patient Profile   35 y/o male w/ chronic systolic HF s/p HM3 LVAD admitted w/ staph aureus drive-line infection   Assessment/Plan:    1. 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 -  Admitted for IV abx vancomycin/cefepime/Flagyl - Driveline culture with S aureus. Blood cx NGTD   - 3/27 abx narrowed to cefazolin 2gm IV Q 8hr => stopped 05/07/22 and got ortivanvin 4/1  - CT abd/pelvis- soft tissue infection. No deep infection  2.  Chronic Systolic HF due to NICM - Echo EF 10% with biventricular failure - HM-3 VAD implant 02/26/19 - Echo 7/21 EF 40-45% - No bb or ARB with previous dehydration/RV failure.  - VAD interrogated personally. Parameters stable. - MAPs improved with losartan - HR jumps to 150 with minimal exertion. Will give one more liter IVF. May need low dose b-blocker.   - INR 1.3 LDH 251. Continue  IV heparin + warfarin until INR >= 1.8. He is several days out from oral surgery. Can bump heparin to try ad get low therapeutic range - Discussed dosing with PharmD personally.   3. Anemia - Baseline hgb has been upper 9's, admission hgb 7.7 => 6.1.  However, hgb up to 10.5 after 2 units PRCs and stool was heme negative.  Does not appear to be actively GI bleeding, ?initial drop dilutional with IVF at admission.   - Hgb improved at 9.6  4. F/E/N - We continue to worry about his nutritional status after his mother died.  5. Broken Teeth - 2022-05-07 s/p extractions of teeth #5 and 15  - Appreciate dental surgery   6. Hypokalemia/ Hypomagnesemia  - K 3.3, Mg 1.6  Will supp  I reviewed the LVAD parameters from today, and compared the results to the patient's prior recorded data.  No programming changes were made.  The LVAD is functioning within specified parameters.  The patient performs LVAD self-test daily.  LVAD interrogation was negative for any significant power changes, alarms or PI events/speed drops.  LVAD equipment check completed and is in good working order.  Back-up equipment present.   LVAD education done on emergency procedures and precautions and reviewed exit site care.  Length of Stay: 9  Arvilla Meres, MD 05/07/2020, 9:19 AM  VAD Team --- VAD ISSUES ONLY--- Pager [phone] (7am - 7am)  Advanced Heart Failure Team  Pager [phone] (M-F; 7a  - 5p)  Please contact [facility] for night-coverage after hours (5p -7a ) and weekends on [email or web address]

## 2020-05-07 NOTE — Progress Notes (Signed)
ANTICOAGULATION CONSULT NOTE  Pharmacy Consult for warfarin + heparin Indication: LVAD  No Known Allergies  Patient Measurements: Height: 5\' 5"  (165.1 cm) Weight: 55.7 kg (122 lb 12.7 oz) IBW/kg (Calculated) : 61.5  Heparin dosing weight: 55.2 kg  Vital Signs: Temp: 98.3 F (36.8 C) (04/03 0800) Temp Source: Oral (04/03 0800) BP: 105/81 (04/03 0300) Pulse Rate: 85 (04/03 0800)  Labs: Recent Labs    05/05/20 0037 05/06/20 0051 05/07/20 0053  HGB 9.1* 9.0* 9.6*  HCT 29.2* 28.9* 30.6*  PLT 293 277 372  LABPROT 13.1 13.1 15.8*  INR 1.0 1.0 1.3*  HEPARINUNFRC <0.10* <0.10* <0.10*  CREATININE 0.68 0.79 0.88    Estimated Creatinine Clearance: 93.2 mL/min (by C-G formula based on SCr of 0.88 mg/dL).   Medical History: Past Medical History:  Diagnosis Date  . Dilated cardiomyopathy (HCC)   . Dilated cardiomyopathy (HCC) 02/2019  . Polysubstance abuse (HCC)     Assessment: 55 yom presenting with concern for driveline infection - hx HM3 implanted on 02/26/19 - on warfarin PTA. PTA regimen is 2.5 mg daily. He is now s/p dental extractions on 3/31.  INR remains subtherapeutic but up from 1.0 to 1.3 today. No overt bleeding noted. Eating well. Hgb has stabilized 9-10s, pltc wnl. LDH 251 remains elevated from baseline (~180s).  Heparin level remains undetectable on low-fixed-dose heparin at 500 units/hr. After discussion with Dr. 4/31, will start titrating heparin to goal ~0.3 until INR close to therapeutic.  Goal of Therapy:  Heparin level ~0.3 INR 2-2.5 Monitor platelets by anticoagulation protocol: Yes   Plan:  Increase IV heparin to 700 units/hr Check 6-hr HL Warfarin 5 mg po x 1 tonight. Daily heparin level, CBC, and INR.  Gala Romney, PharmD, BCPS PGY2 Cardiology Pharmacy Resident Phone: [phone] 05/07/2020  9:34 AM  Please check [email or web address] for unit-specific pharmacy phone numbers.

## 2020-05-07 NOTE — Plan of Care (Signed)
?  Problem: Education: ?Goal: Knowledge of General Education information will improve ?Description: Including pain rating scale, medication(s)/side effects and non-pharmacologic comfort measures ?Outcome: Progressing ?  ?Problem: Health Behavior/Discharge Planning: ?Goal: Ability to manage health-related needs will improve ?Outcome: Progressing ?  ?Problem: Clinical Measurements: ?Goal: Ability to maintain clinical measurements within normal limits will improve ?Outcome: Progressing ?Goal: Will remain free from infection ?Outcome: Progressing ?Goal: Diagnostic test results will improve ?Outcome: Progressing ?Goal: Respiratory complications will improve ?Outcome: Progressing ?Goal: Cardiovascular complication will be avoided ?Outcome: Progressing ?  ?Problem: Activity: ?Goal: Risk for activity intolerance will decrease ?Outcome: Progressing ?  ?Problem: Nutrition: ?Goal: Adequate nutrition will be maintained ?Outcome: Progressing ?  ?Problem: Coping: ?Goal: Level of anxiety will decrease ?Outcome: Progressing ?  ?Problem: Elimination: ?Goal: Will not experience complications related to bowel motility ?Outcome: Progressing ?Goal: Will not experience complications related to urinary retention ?Outcome: Progressing ?  ?Problem: Pain Managment: ?Goal: General experience of comfort will improve ?Outcome: Progressing ?  ?Problem: Safety: ?Goal: Ability to remain free from injury will improve ?Outcome: Progressing ?  ?Problem: Skin Integrity: ?Goal: Risk for impaired skin integrity will decrease ?Outcome: Progressing ?  ?Problem: Education: ?Goal: Patient will understand all VAD equipment and how it functions ?Outcome: Progressing ?Goal: Patient will be able to verbalize current INR target range and antiplatelet therapy for discharge home ?Outcome: Progressing ?  ?Problem: Cardiac: ?Goal: LVAD will function as expected and patient will experience no clinical alarms ?Outcome: Progressing ?  ?

## 2020-05-07 NOTE — Progress Notes (Signed)
ANTICOAGULATION CONSULT NOTE  Pharmacy Consult for warfarin + heparin Indication: LVAD  No Known Allergies  Patient Measurements: Height: 5\' 5"  (165.1 cm) Weight: 55.7 kg (122 lb 12.7 oz) IBW/kg (Calculated) : 61.5  Heparin dosing weight: 55.2 kg  Vital Signs: Temp: 98.2 F (36.8 C) (04/03 1600) Temp Source: Oral (04/03 1600) Pulse Rate: 93 (04/03 1600)  Labs: Recent Labs    05/05/20 0037 05/06/20 0051 05/07/20 0053 05/07/20 1655  HGB 9.1* 9.0* 9.6*  --   HCT 29.2* 28.9* 30.6*  --   PLT 293 277 372  --   LABPROT 13.1 13.1 15.8*  --   INR 1.0 1.0 1.3*  --   HEPARINUNFRC <0.10* <0.10* <0.10* 0.17*  CREATININE 0.68 0.79 0.88  --     Estimated Creatinine Clearance: 93.2 mL/min (by C-G formula based on SCr of 0.88 mg/dL).   Medical History: Past Medical History:  Diagnosis Date  . Dilated cardiomyopathy (HCC)   . Dilated cardiomyopathy (HCC) 02/2019  . 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 PTA regimen is 2.5 mg daily. He is now s/p dental extractions on 3/31.  INR remains subtherapeutic but up from 1.0 to 1.3 today. No overt bleeding noted. Eating well. Hgb has stabilized 9-10s, pltc wnl. LDH 251 remains elevated from baseline (~180s).  Heparin level trending up off low-fixed-dose heparin which was adjusted this morning to 700 units/hr. No bleeding or IV issues noted.   Goal of Therapy:  Heparin level ~0.3 INR 2-2.5 Monitor platelets by anticoagulation protocol: Yes   Plan:  Increase IV heparin to 800 units/hr Check heparin level with am labs Warfarin 5 mg po x 1 tonight. Daily heparin level, CBC, and INR.  4/31 PharmD., BCPS Clinical Pharmacist 05/07/2020 6:47 PM   Please check [email or web address] for unit-specific pharmacy phone numbers.

## 2020-05-08 DIAGNOSIS — D649 Anemia, unspecified: Secondary | ICD-10-CM | POA: Diagnosis not present

## 2020-05-08 DIAGNOSIS — I5022 Chronic systolic (congestive) heart failure: Secondary | ICD-10-CM | POA: Diagnosis not present

## 2020-05-08 DIAGNOSIS — T827XXA Infection and inflammatory reaction due to other cardiac and vascular devices, implants and grafts, initial encounter: Secondary | ICD-10-CM | POA: Diagnosis not present

## 2020-05-08 LAB — MAGNESIUM: Magnesium: 1.8 mg/dL (ref 1.7–2.4)

## 2020-05-08 LAB — CBC
HCT: 28.5 % — ABNORMAL LOW (ref 39.0–52.0)
Hemoglobin: 8.9 g/dL — ABNORMAL LOW (ref 13.0–17.0)
MCH: 30.7 pg (ref 26.0–34.0)
MCHC: 31.2 g/dL (ref 30.0–36.0)
MCV: 98.3 fL (ref 80.0–100.0)
Platelets: 367 10*3/uL (ref 150–400)
RBC: 2.9 MIL/uL — ABNORMAL LOW (ref 4.22–5.81)
RDW: 20.9 % — ABNORMAL HIGH (ref 11.5–15.5)
WBC: 7.9 10*3/uL (ref 4.0–10.5)
nRBC: 0 % (ref 0.0–0.2)

## 2020-05-08 LAB — BASIC METABOLIC PANEL
Anion gap: 9 (ref 5–15)
BUN: <5 mg/dL — ABNORMAL LOW (ref 6–20)
CO2: 24 mmol/L (ref 22–32)
Calcium: 9.3 mg/dL (ref 8.9–10.3)
Chloride: 104 mmol/L (ref 98–111)
Creatinine, Ser: 0.81 mg/dL (ref 0.61–1.24)
GFR, Estimated: >60 mL/min (ref >60)
Glucose, Bld: 125 mg/dL — ABNORMAL HIGH (ref 70–99)
Potassium: 4 mmol/L (ref 3.5–5.1)
Sodium: 137 mmol/L (ref 135–145)

## 2020-05-08 LAB — PROTIME-INR
INR: 1.1 (ref 0.8–1.2)
Prothrombin Time: 13.7 seconds (ref 11.4–15.2)

## 2020-05-08 LAB — LACTATE DEHYDROGENASE: LDH: 213 U/L — ABNORMAL HIGH (ref 98–192)

## 2020-05-08 LAB — HEPARIN LEVEL (UNFRACTIONATED)
Heparin Unfractionated: 0.17 IU/mL — ABNORMAL LOW (ref 0.30–0.70)
Heparin Unfractionated: 0.3 IU/mL (ref 0.30–0.70)

## 2020-05-08 MED ORDER — WARFARIN SODIUM 10 MG PO TABS
10.0000 mg | ORAL_TABLET | Freq: Once | ORAL | Status: AC
  Administered 2020-05-08: 10 mg via ORAL
  Filled 2020-05-08: qty 1

## 2020-05-08 MED ORDER — MAGNESIUM SULFATE 2 GM/50ML IV SOLN
2.0000 g | Freq: Once | INTRAVENOUS | Status: AC
  Administered 2020-05-08: 2 g via INTRAVENOUS
  Filled 2020-05-08: qty 50

## 2020-05-08 NOTE — Progress Notes (Addendum)
Patient ID: Jon Martin, male   DOB: 03-28-1985, 35 y.o.   MRN: [id number]   Advanced Heart Failure VAD Team Note  PCP-Cardiologist: No primary care provider on file.   Subjective:     HM3 LVAD admitted w/ Drive-line infection.  - Wound culture with S aureus. Blood cx NGTD - 3/26 abx narrowed to cefazolin 2gm IV Q 8hr. ID following  - 3/28 CT abd/pelvis soft tissue no deep infection. Oral Surgeon consulted.  - 3/31 s/p extractions of teeth #5 and 15  - 3/31 Low flow alarms last night, got 1 L IVF and low flows have stopped  Remains on heparin drip + coumadin. INR 1.1.   Frustrated. Wants to go home.   LVAD INTERROGATION:   HeartMate 3 LVAD:   Flow 3.4 liters/min, speed 5400, power 4.0  PI 6.6   VAD interrogated personally. Parameters stable.  Objective:    Vital Signs:   Temp:  [98.1 F (36.7 C)-98.6 F (37 C)] 98.4 F (36.9 C) (04/04 0300) Pulse Rate:  [79-93] 83 (04/04 0300) Resp:  [17-20] 18 (04/04 0300) BP: (102-108)/(73-86) 102/80 (04/04 0300) SpO2:  [97 %-98 %] 98 % (04/04 0300) Weight:  [55.9 kg] 55.9 kg (04/04 0300) Last BM Date: 05/03/20 Mean arterial Pressure 90s  Intake/Output:   Intake/Output Summary (Last 24 hours) at 05/08/2020 0718 Last data filed at 05/08/2020 0458 Gross per 24 hour  Intake 1915.09 ml  Output 1550 ml  Net 365.09 ml     Physical Exam   Physical Exam: GENERAL: No acute distress. HEENT: normal  NECK: Supple, JVP flat .  2+ bilaterally, no bruits.  No lymphadenopathy or thyromegaly appreciated.   CARDIAC:  Mechanical heart sounds with LVAD hum present.  LUNGS:  Clear to auscultation bilaterally.  ABDOMEN:  Soft, round, nontender, positive bowel sounds x4.     LVAD exit site: well-healed and incorporated.  Dressing dry and intact.  No erythema or drainage.  Stabilization device present and accurately applied.  Driveline dressing is being changed daily per sterile technique. EXTREMITIES:  Warm and dry, no cyanosis, clubbing, rash or  edema  NEUROLOGIC:  Alert and oriented x 3.    No aphasia.  No dysarthria.  Affect pleasant.      Telemetry   SR 80-90s   Labs   Basic Metabolic Panel: Recent Labs  Lab 05/03/20 0159 05/04/20 0047 05/05/20 0037 05/06/20 0051 05/07/20 0053 05/08/20 0351  NA 136  --  136 137 136 137  K 3.4*  --  3.6 3.7 3.3* 4.0  CL 105  --  104 105 104 104  CO2 24  --  26 26 25 24   GLUCOSE 111*  --  127* 98 116* 125*  BUN <5*  --  <5* 5* <5* <5*  CREATININE 0.70  --  0.68 0.79 0.88 0.81  CALCIUM 8.9  --  8.8* 9.0 9.0 9.3  MG 1.7 1.9 1.7 2.2 1.6* 1.8    Liver Function Tests: No results for input(s): AST, ALT, ALKPHOS, BILITOT, PROT, ALBUMIN in the last 168 hours. No results for input(s): LIPASE, AMYLASE in the last 168 hours. No results for input(s): AMMONIA in the last 168 hours.  CBC: Recent Labs  Lab 05/03/20 0159 05/05/20 0037 05/06/20 0051 05/07/20 0053 05/08/20 0351  WBC 8.3 6.4 5.2 6.9 7.9  HGB 9.4* 9.1* 9.0* 9.6* 8.9*  HCT 28.6* 29.2* 28.9* 30.6* 28.5*  MCV 96.9 98.3 98.0 99.0 98.3  PLT 221 293 277 372 367    INR: Recent  Labs  Lab 05-14-2020 0047 05/05/20 0037 05/06/20 0051 05/07/20 0053 05/08/20 0351  INR 1.0 1.0 1.0 1.3* 1.1    Other results:    Imaging   No results found.   Medications:     Scheduled Medications: . losartan  25 mg Oral Daily  . pantoprazole  40 mg Oral Daily  . sodium chloride flush  3 mL Intravenous Q12H  . Warfarin - Pharmacist Dosing Inpatient   Does not apply q1600    Infusions: . sodium chloride    . heparin 900 Units/hr (05/08/20 0458)  . lactated ringers 10 mL/hr at 2020/05/14 0921  . lactated ringers 10 mL/hr at 2020/05/14 1136    PRN Medications: sodium chloride, acetaminophen, ondansetron (ZOFRAN) IV   Patient Profile   35 y/o male w/ chronic systolic HF s/p HM3 LVAD admitted w/ staph aureus drive-line infection   Assessment/Plan:    1. 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 -  Admitted for IV abx vancomycin/cefepime/Flagyl - Driveline culture with S aureus. Blood cx NGTD  - 3/27 abx narrowed to cefazolin 2gm IV Q 8hr => stopped May 15, 2022 and got ortivanvin 4/1  - CT abd/pelvis- soft tissue infection. No deep infection  2.  Chronic Systolic HF due to NICM - Echo EF 10% with biventricular failure - HM-3 VAD implant 02/26/19 - Echo 7/21 EF 40-45% - No bb or ARB with previous dehydration/RV failure.  - VAD interrogated personally. Parameters stable. - MAPs 80s  - Continue losartan-  - INR 1.1 LDH 213. Continue  IV heparin + warfarin until INR >= 1.8. He is several days out from oral surgery.  Discussed with pharmacy.   3. Anemia - Baseline hgb has been upper 9's, admission hgb 7.7 => 6.1.  However, hgb up to 10.5 after 2 units PRCs and stool was heme negative.  Does not appear to be actively GI bleeding, ?initial drop dilutional with IVF at admission.   - Hgb 8.9 today. No obvious source.   4. F/E/N - We continue to worry about his nutritional status after his mother died.  5. Broken Teeth - 05/15/2022 s/p extractions of teeth #5 and 15  - Appreciate dental surgery   6. Hypokalemia/ Hypomagnesemia  - K 4  - Mag 1.8- Give 2 grams mag.   I reviewed the LVAD parameters from today, and compared the results to the patient's prior recorded data.  No programming changes were made.  The LVAD is functioning within specified parameters.  The patient performs LVAD self-test daily.  LVAD interrogation was negative for any significant power changes, alarms or PI events/speed drops.  LVAD equipment check completed and is in good working order.  Back-up equipment present.   LVAD education done on emergency procedures and precautions and reviewed exit site care.  Length of Stay: 10  [name], NP 05/08/2020, 7:18 AM  VAD Team --- VAD ISSUES ONLY--- Pager [phone] (7am - 7am)  Advanced Heart Failure Team  Pager [phone] (M-F; 7a - 5p)  Please contact [facility] for night-coverage after hours (5p -7a ) and weekends on [email or web address]  Patient seen and examined with the above-signed Advanced Practice Provider and/or Housestaff. I personally reviewed laboratory data, imaging studies and relevant notes. I independently examined the patient and formulated the important aspects of the plan. I have edited the note to reflect any of my changes or salient points. I have personally discussed the plan with the patient and/or family.  Given  some fluids yesterday for brisk tachycardia with minimal exertion. Feels good today. No mouth pain. On warfarin/heparin. INR 1.1  VAD interrogated personally. Parameters stable.  General:  NAD.  HEENT: normal  Neck: supple. JVP not elevated.  Carotids 2+ bilat; no bruits. No lymphadenopathy or thryomegaly appreciated. Cor: LVAD hum.  Lungs: Clear. Abdomen: soft, nontender, non-distended. No hepatosplenomegaly. No bruits or masses. Good bowel sounds. Driveline site clean. Anchor in place.  Extremities: no cyanosis, clubbing, rash. Warm no edema  Neuro: alert & oriented x 3. No focal deficits. Moves all 4 without problem   Remains on heparin/warfarin. No bleeding. INR 1.1. Would ambulate today and check HR when up. Continue to load warfarin. Home when INR >= 1.8. Discussed dosing with PharmD personally.  Arvilla Meres, MD  8:35 AM

## 2020-05-08 NOTE — Progress Notes (Signed)
ANTICOAGULATION CONSULT NOTE  Pharmacy Consult for warfarin + heparin Indication: LVAD  No Known Allergies  Patient Measurements: Height: 5\' 5"  (165.1 cm) Weight: 55.9 kg (123 lb 3.8 oz) IBW/kg (Calculated) : 61.5  Heparin dosing weight: 55.2 kg  Vital Signs: Temp: 98.4 F (36.9 C) (04/04 0300) Temp Source: Oral (04/04 0300) BP: 102/80 (04/04 0300) Pulse Rate: 83 (04/04 0300)  Labs: Recent Labs    05/06/20 0051 05/07/20 0053 05/07/20 1655 05/08/20 0351  HGB 9.0* 9.6*  --  8.9*  HCT 28.9* 30.6*  --  28.5*  PLT 277 372  --  367  LABPROT 13.1 15.8*  --   --   INR 1.0 1.3*  --   --   HEPARINUNFRC <0.10* <0.10* 0.17* 0.17*  CREATININE 0.79 0.88  --  0.81    Estimated Creatinine Clearance: 101.6 mL/min (by C-G formula based on SCr of 0.81 mg/dL).   Medical History: Past Medical History:  Diagnosis Date  . Dilated cardiomyopathy (HCC)   . Dilated cardiomyopathy (HCC) 02/2019  . Polysubstance abuse (HCC)     Assessment: 55 yom presenting with concern for driveline infection - hx HM3 implanted on 02/26/19 - on warfarin PTA. PTA regimen is 2.5 mg daily. He is now s/p dental extractions on 3/31.  Heparin level 0.17 units/ml.  No bleeding reported  Goal of Therapy:  Heparin level ~0.3 INR 2-2.5 Monitor platelets by anticoagulation protocol: Yes   Plan:  Increase IV heparin to 900 units/hr Check heparin level in 6-8 hours Daily heparin level, CBC, and INR.  4/31, PharmD Clinical Pharmacist 05/08/2020 4:38 AM   Please check [email or web address] for unit-specific pharmacy phone numbers.

## 2020-05-08 NOTE — Progress Notes (Signed)
ANTICOAGULATION CONSULT NOTE  Pharmacy Consult for warfarin + heparin Indication: LVAD  No Known Allergies  Patient Measurements: Height: 5\' 5"  (165.1 cm) Weight: 55.9 kg (123 lb 3.8 oz) IBW/kg (Calculated) : 61.5  Heparin dosing weight: 55.2 kg  Vital Signs: Temp: 98.1 F (36.7 C) (04/04 0747) Temp Source: Oral (04/04 0747) BP: 93/59 (04/04 1120) Pulse Rate: 95 (04/04 1120)  Labs: Recent Labs    05/06/20 0051 05/07/20 0053 05/07/20 1655 05/08/20 0351 05/08/20 1157  HGB 9.0* 9.6*  --  8.9*  --   HCT 28.9* 30.6*  --  28.5*  --   PLT 277 372  --  367  --   LABPROT 13.1 15.8*  --  13.7  --   INR 1.0 1.3*  --  1.1  --   HEPARINUNFRC <0.10* <0.10* 0.17* 0.17* 0.30  CREATININE 0.79 0.88  --  0.81  --     Estimated Creatinine Clearance: 101.6 mL/min (by C-G formula based on SCr of 0.81 mg/dL).   Medical History: Past Medical History:  Diagnosis Date  . Dilated cardiomyopathy (HCC)   . Dilated cardiomyopathy (HCC) 02/2019  . Polysubstance abuse (HCC)     Assessment: 19 yom presenting with concern for driveline infection - hx HM3 implanted on 02/26/19 - on warfarin PTA. PTA regimen is 2.5 mg daily. He is now s/p dental extractions on 3/31.  INR remains subtherapeutic at 1 today. No overt bleeding noted. Eating well. Hgb has stabilized 9-10s, pltc wnl. LDH 251 remains elevated from baseline (~180s).  Heparin level trending up 0.3 now at goal  off low-fixed-dose heparin which was adjusted to 900 units/hr. No bleeding or IV issues noted.   Goal of Therapy:  Heparin level ~0.3 INR 2-2.5 Monitor platelets by anticoagulation protocol: Yes   Plan:  Continue IV heparin 900 units/hr Check heparin level with am labs Warfarin 10 mg po x 1 tonight. Daily heparin level, CBC, and INR.   4/31 Pharm.D. CPP, BCPS Clinical Pharmacist [phone] 05/08/2020 1:12 PM     Please check [email or web address] for unit-specific pharmacy phone numbers.

## 2020-05-08 NOTE — Progress Notes (Signed)
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   HM III LVAD implanted on 02/26/19 by Dr. Maren Beach under Destination Therapy criteria.  Pt asleep in bed. Annoyed that I woke him up to check his driveline. Pt states he is ready to go home. Anchor off-replaced.   Pt was given fluid bolus over the weekend for low flows.  Vital signs: Temp: 98.1  HR: 91 Doppler Pressure: 82 Automatic BP: 113/76 (87) O2 Sat: 100% RA Wt: 122.8>123>124.1>120.8>121.4>121.6>121.9>123.2 lbs   LVAD interrogation reveals:  Speed: 5400 Flow: 3.8  Power: 3.9w PI: 5 Hct: 20 - do not change  Alarms: 1 asymptomatic LOW FLOW this morning Events: 15 PI events so far today   Fixed speed: 5400 Low speed limit: 5100  Drive Line: CDI. Pt's anchor is dangling of his side this morning. Drive line anchor replaced again today. Every other day dressing changes using silk tape per BS nurse. Next dressing change due 05/09/20.   Labs:  LDH trend: 181>171>185>196>193>295>233>213  INR trend: 2.3>1.7>1.2>1.1>1.1>1.0>1.0>1.1  Anticoagulation Plan: -INR Goal: 2.0 - 2.5 -ASA Dose: none  Blood Products:  - 04/29/20>>2 units PCs  Device: N/A  Infection:  - 04/28/20 BCs>> No growth x 3 days - 04/28/20 DL culture>>rare staph aureus; final  Drips:  Heparin @ 900 units/hr  Plan/Recommendations:  1. Call VAD Coordinator if any VAD equipment or drive line issues. 2. Every other day dressing changes per BS nurse.   Carlton Adam RN VAD Coordinator  Office: [phone]  24/7 Pager: [phone]

## 2020-05-09 DIAGNOSIS — D649 Anemia, unspecified: Secondary | ICD-10-CM | POA: Diagnosis not present

## 2020-05-09 DIAGNOSIS — I5022 Chronic systolic (congestive) heart failure: Secondary | ICD-10-CM | POA: Diagnosis not present

## 2020-05-09 DIAGNOSIS — T827XXA Infection and inflammatory reaction due to other cardiac and vascular devices, implants and grafts, initial encounter: Secondary | ICD-10-CM | POA: Diagnosis not present

## 2020-05-09 LAB — MAGNESIUM: Magnesium: 2 mg/dL (ref 1.7–2.4)

## 2020-05-09 LAB — CULTURE, BLOOD (ROUTINE X 2)
Culture: NO GROWTH
Culture: NO GROWTH
Special Requests: ADEQUATE

## 2020-05-09 LAB — BASIC METABOLIC PANEL
Anion gap: 7 (ref 5–15)
BUN: 5 mg/dL — ABNORMAL LOW (ref 6–20)
CO2: 27 mmol/L (ref 22–32)
Calcium: 9.4 mg/dL (ref 8.9–10.3)
Chloride: 101 mmol/L (ref 98–111)
Creatinine, Ser: 0.73 mg/dL (ref 0.61–1.24)
GFR, Estimated: >60 mL/min (ref >60)
Glucose, Bld: 101 mg/dL — ABNORMAL HIGH (ref 70–99)
Potassium: 3.9 mmol/L (ref 3.5–5.1)
Sodium: 135 mmol/L (ref 135–145)

## 2020-05-09 LAB — HEPARIN LEVEL (UNFRACTIONATED)
Heparin Unfractionated: 0.12 IU/mL — ABNORMAL LOW (ref 0.30–0.70)
Heparin Unfractionated: 0.34 IU/mL (ref 0.30–0.70)

## 2020-05-09 LAB — CBC
HCT: 29.5 % — ABNORMAL LOW (ref 39.0–52.0)
Hemoglobin: 9.3 g/dL — ABNORMAL LOW (ref 13.0–17.0)
MCH: 30.2 pg (ref 26.0–34.0)
MCHC: 31.5 g/dL (ref 30.0–36.0)
MCV: 95.8 fL (ref 80.0–100.0)
Platelets: 371 10*3/uL (ref 150–400)
RBC: 3.08 MIL/uL — ABNORMAL LOW (ref 4.22–5.81)
RDW: 21 % — ABNORMAL HIGH (ref 11.5–15.5)
WBC: 8.7 10*3/uL (ref 4.0–10.5)
nRBC: 0 % (ref 0.0–0.2)

## 2020-05-09 LAB — PROTIME-INR
INR: 1.1 (ref 0.8–1.2)
Prothrombin Time: 13.8 seconds (ref 11.4–15.2)

## 2020-05-09 LAB — LACTATE DEHYDROGENASE: LDH: 222 U/L — ABNORMAL HIGH (ref 98–192)

## 2020-05-09 MED ORDER — WARFARIN SODIUM 10 MG PO TABS
10.0000 mg | ORAL_TABLET | Freq: Once | ORAL | Status: AC
  Administered 2020-05-09: 10 mg via ORAL
  Filled 2020-05-09: qty 1

## 2020-05-09 MED ORDER — LIDOCAINE HCL (PF) 1 % IJ SOLN
5.0000 mL | Freq: Once | INTRAMUSCULAR | Status: AC
  Administered 2020-05-09: 5 mL via INTRADERMAL

## 2020-05-09 NOTE — Progress Notes (Signed)
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   HM III LVAD implanted on 02/26/19 by Dr. Maren Beach under Destination Therapy criteria.  Pt asleep in the bed. Woke pt up in order for me to perform his dressing change. Pt is asking to go home.  No alarms overnight.   Vital signs: Temp: 98.3  HR: 92 Doppler Pressure: 82 Automatic BP: 110/65 (76) O2 Sat: 97% RA Wt: 122.8>123>124.1>120.8>121.4>121.6>121.9>123.2>121.9 lbs   LVAD interrogation reveals:  Speed: 5400 Flow: 4.1  Power: 4.0w PI: 4 Hct: 20 - do not change  Alarms: none Events: 25 PI events so far today   Fixed speed: 5400 Low speed limit: 5100  Drive Line: Existing VAD dressing removed and site care performed using sterile technique. Drive line exit site cleaned with Chlora prep applicators x 2, RINSED WITH SALINE, allowed to dry, and Sorbaview dressing with bio patch re-applied. Exit site healed and incorporated, the velour is fully implanted at exit site. No redness, tenderness, drainage, foul odor or rash noted. Drive line anchor re-applied. May advance to weekly dressing changes. Next dressing change due 05/16/20.  Pt did have a superficial abscess approx 2" from the driveline and at  9 o'clock to the driveline site. The abscess measures to be approx 1 cm. I was able to express purulent drainage from this area. Dr. Gala Romney numbed this area with Lidocaine and made a very small incision to allow the area to drain. This site was cultured today. It does not appear to be communicating with the driveline. The area was covered with 2 x 2 gauze and silk tape. Nurse instructed to change daily or as needed if soiled.  Labs:  LDH trend: 181>171>185>196>193>295>233>213>222  INR trend: 2.3>1.7>1.2>1.1>1.1>1.0>1.0>1.1  Anticoagulation Plan: -INR Goal: 2.0 - 2.5 -ASA Dose: none  Blood Products:  - 04/29/20>>2 units PCs  Device: N/A  Infection:  - 04/28/20  BCs>> No growth x 3 days - 04/28/20 DL culture>>rare staph aureus; final - 05/09/20 Abdominal abscess culture>> pending  Drips:  Heparin @ 1000 units/hr  Plan/Recommendations:  1. Call VAD Coordinator if any VAD equipment or drive line issues. 2. Weekly dressing changes on driveline. Please change the abscess dressing every day or as needed if soiled.    Carlton Adam RN VAD Coordinator  Office: [phone]  24/7 Pager: [phone]

## 2020-05-09 NOTE — Progress Notes (Addendum)
ANTICOAGULATION CONSULT NOTE  Pharmacy Consult for warfarin + heparin Indication: LVAD  No Known Allergies  Patient Measurements: Height: 5\' 5"  (165.1 cm) Weight: 55.3 kg (121 lb 14.6 oz) IBW/kg (Calculated) : 61.5  Heparin dosing weight: 55.2 kg  Vital Signs: Temp: 98.2 F (36.8 C) (04/05 0752) Temp Source: Oral (04/05 0752) BP: 100/88 (04/05 0752) Pulse Rate: 98 (04/05 0752)  Labs: Recent Labs    05/07/20 0053 05/07/20 1655 05/08/20 0351 05/08/20 1157 05/09/20 0041 05/09/20 0754  HGB 9.6*  --  8.9*  --  9.3*  --   HCT 30.6*  --  28.5*  --  29.5*  --   PLT 372  --  367  --  371  --   LABPROT 15.8*  --  13.7  --  13.8  --   INR 1.3*  --  1.1  --  1.1  --   HEPARINUNFRC <0.10*   < > 0.17* 0.30 0.12* 0.34  CREATININE 0.88  --  0.81  --  0.73  --    < > = values in this interval not displayed.    Estimated Creatinine Clearance: 101.8 mL/min (by C-G formula based on SCr of 0.73 mg/dL).   Medical History: Past Medical History:  Diagnosis Date  . Dilated cardiomyopathy (HCC)   . Dilated cardiomyopathy (HCC) 02/2019  . Polysubstance abuse (HCC)     Assessment: 86 yom presenting with concern for driveline infection - hx HM3 implanted on 02/26/19 - on warfarin PTA. PTA regimen is 2.5 mg daily. He is now s/p dental extractions on 3/31.  INR remains subtherapeutic at 1 today despite much higher doses than PTA. No overt bleeding noted. Eating well. Hgb has stabilized 9-10s, pltc wnl. LDH 251 remains elevated from baseline (~180s).  Heparin level 0.3 this am after heaprin drip increased to 1000 uts/hr. No bleeding or IV issues noted.   Goal of Therapy:  Heparin level ~0.3 INR 2-2.5 Monitor platelets by anticoagulation protocol: Yes   Plan:  Continue IV heparin 1000 units/hr Warfarin 10 mg po x 1 tonight repeat Daily heparin level, CBC, and INR.   4/31 Pharm.D. CPP, BCPS Clinical Pharmacist [phone] 05/09/2020 9:18 AM     Please check [email or web address] for  unit-specific pharmacy phone numbers.

## 2020-05-09 NOTE — Progress Notes (Signed)
ANTICOAGULATION CONSULT NOTE - Follow Up Consult  Pharmacy Consult for heparin Indication: LVAD  Labs: Recent Labs    05/07/20 0053 05/07/20 1655 05/08/20 0351 05/08/20 1157 05/09/20 0041  HGB 9.6*  --  8.9*  --  9.3*  HCT 30.6*  --  28.5*  --  29.5*  PLT 372  --  367  --  371  LABPROT 15.8*  --  13.7  --  13.8  INR 1.3*  --  1.1  --  1.1  HEPARINUNFRC <0.10*   < > 0.17* 0.30 0.12*  CREATININE 0.88  --  0.81  --  0.73   < > = values in this interval not displayed.    Assessment: 34yo male subtherapeutic on heparin after one level at goal; no gtt issues or signs of bleeding per RN.  Goal of Therapy:  Heparin level ~0.3 units/ml   Plan:  Will increase heparin gtt slightly to 1000 units/hr and check level in 6 hours.    Vernard Gambles, PharmD, BCPS  05/09/2020,1:41 AM

## 2020-05-09 NOTE — Progress Notes (Signed)
Patient ID: Jon Martin, male   DOB: 03-09-1985, 35 y.o.   MRN: [id number]   Advanced Heart Failure VAD Team Note  PCP-Cardiologist: No primary care provider on file.   Subjective:     HM3 LVAD admitted w/ Drive-line infection.  - Wound culture with S aureus. Blood cx NGTD - 3/26 abx narrowed to cefazolin 2gm IV Q 8hr. ID following  - 3/28 CT abd/pelvis soft tissue no deep infection. Oral Surgeon consulted.  - 3/31 s/p extractions of teeth #5 and 15  - 3/31 Low flow alarms last night, got 1 L IVF and low flows have stopped  Remains on heparin drip + coumadin. Got 10mg  coumadin yesterday.  INR 1.1.   Denies SOB, orthopnea or PND. Has a superficial   LVAD INTERROGATION:   HeartMate 3 LVAD:   Flow 3.9 liters/min, speed 5400, power 4.0  PI 4.5   VAD interrogated personally. Parameters stable.  Objective:    Vital Signs:   Temp:  [97.8 F (36.6 C)-98.6 F (37 C)] 98.2 F (36.8 C) (04/05 0752) Pulse Rate:  [95-98] 98 (04/05 0752) Resp:  [14-19] 16 (04/05 0752) BP: (86-100)/(59-88) 100/88 (04/05 0752) Weight:  [55.3 kg] 55.3 kg (04/05 0416) Last BM Date: 05/03/20 Mean arterial Pressure 90s  Intake/Output:   Intake/Output Summary (Last 24 hours) at 05/09/2020 0956 Last data filed at 05/08/2020 1916 Gross per 24 hour  Intake 1990 ml  Output 1425 ml  Net 565 ml     Physical Exam   General:  NAD.  HEENT: normal  Neck: supple. JVP not elevated.  Carotids 2+ bilat; no bruits. No lymphadenopathy or thryomegaly appreciated. Cor: LVAD hum.  Lungs: Clear. Abdomen: soft, nontender, non-distended. No hepatosplenomegaly. No bruits or masses. Good bowel sounds. Driveline site clean. Anchor in place.  Small pustule on abdomen over driveline Extremities: no cyanosis, clubbing, rash. Warm no edema  Neuro: alert & oriented x 3. No focal deficits. Moves all 4 without problem   Telemetry   SR 80-90s Personally reviewed   Labs   Basic Metabolic Panel: Recent Labs  Lab  05/05/20 0037 05/06/20 0051 05/07/20 0053 05/08/20 0351 05/09/20 0041  NA 136 137 136 137 135  K 3.6 3.7 3.3* 4.0 3.9  CL 104 105 104 104 101  CO2 26 26 25 24 27   GLUCOSE 127* 98 116* 125* 101*  BUN <5* 5* <5* <5* 5*  CREATININE 0.68 0.79 0.88 0.81 0.73  CALCIUM 8.8* 9.0 9.0 9.3 9.4  MG 1.7 2.2 1.6* 1.8 2.0    Liver Function Tests: No results for input(s): AST, ALT, ALKPHOS, BILITOT, PROT, ALBUMIN in the last 168 hours. No results for input(s): LIPASE, AMYLASE in the last 168 hours. No results for input(s): AMMONIA in the last 168 hours.  CBC: Recent Labs  Lab 05/05/20 0037 05/06/20 0051 05/07/20 0053 05/08/20 0351 05/09/20 0041  WBC 6.4 5.2 6.9 7.9 8.7  HGB 9.1* 9.0* 9.6* 8.9* 9.3*  HCT 29.2* 28.9* 30.6* 28.5* 29.5*  MCV 98.3 98.0 99.0 98.3 95.8  PLT 293 277 372 367 371    INR: Recent Labs  Lab 05/05/20 0037 05/06/20 0051 05/07/20 0053 05/08/20 0351 05/09/20 0041  INR 1.0 1.0 1.3* 1.1 1.1    Other results:    Imaging   No results found.   Medications:     Scheduled Medications: . lidocaine (PF)  5 mL Intradermal Once  . losartan  25 mg Oral Daily  . pantoprazole  40 mg Oral Daily  . sodium chloride  flush  3 mL Intravenous Q12H  . warfarin  10 mg Oral ONCE-1600  . Warfarin - Pharmacist Dosing Inpatient   Does not apply q1600    Infusions: . sodium chloride    . heparin 1,000 Units/hr (05/09/20 0144)  . lactated ringers 10 mL/hr at May 14, 2020 0921  . lactated ringers 10 mL/hr at 05-14-2020 1136    PRN Medications: sodium chloride, acetaminophen, ondansetron (ZOFRAN) IV   Patient Profile   35 y/o male w/ chronic systolic HF s/p HM3 LVAD admitted w/ staph aureus drive-line infection   Assessment/Plan:    1. 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 -  Admitted for IV abx vancomycin/cefepime/Flagyl - Driveline culture with S aureus. Blood cx NGTD  - 3/27 abx narrowed to cefazolin 2gm IV Q 8hr  => stopped 2022/05/15 and got ortivanvin 4/1  - CT abd/pelvis- soft tissue infection. No deep infection - Now with small pustule on abdomen. We cultured the site. I also performed superficial I&D at bedside on 05/09/20. No communication with DL tunnel   2.  Chronic Systolic HF due to NICM - Echo EF 10% with biventricular failure - HM-3 VAD implant 02/26/19 - Echo 7/21 EF 40-45% - No bb or ARB with previous dehydration/RV failure.  - VAD interrogated personally. Parameters stable. - MAPs 80-90s - Continue losartan-  - INR 1.1 LDH 222 Continue  IV heparin + warfarin until INR >= 1.8. Heparin level 0.34 Discussed dosing with PharmD personally. -Needs to walk today an reassess HR response to exercise (had been overly brisk). Place CR consult    3. Anemia - Baseline hgb has been upper 9's, admission hgb 7.7 => 6.1.  However, hgb up to 10.5 after 2 units PRCs and stool was heme negative.  Does not appear to be actively GI bleeding, ?initial drop dilutional with IVF at admission.   - Hgb 9.3 today. No obvious source.   4. F/E/N - We continue to worry about his nutritional status after his mother died.  5. Broken Teeth - 2022-05-15 s/p extractions of teeth #5 and 15  - Appreciate dental surgery  - site looks good  6. Hypokalemia/ Hypomagnesemia  - K 3.9. Mg 2.0    I reviewed the LVAD parameters from today, and compared the results to the patient's prior recorded data.  No programming changes were made.  The LVAD is functioning within specified parameters.  The patient performs LVAD self-test daily.  LVAD interrogation was negative for any significant power changes, alarms or PI events/speed drops.  LVAD equipment check completed and is in good working order.  Back-up equipment present.   LVAD education done on emergency procedures and precautions and reviewed exit site care.  Length of Stay: 82  Arvilla Meres, MD 05/09/2020, 9:56 AM  VAD Team --- VAD ISSUES ONLY--- Pager [phone] (7am -  7am)  Advanced Heart Failure Team  Pager [phone] (M-F; 7a - 5p)  Please contact [facility] for night-coverage after hours (5p -7a ) and weekends on [email or web address]

## 2020-05-10 DIAGNOSIS — I5022 Chronic systolic (congestive) heart failure: Secondary | ICD-10-CM | POA: Diagnosis not present

## 2020-05-10 DIAGNOSIS — D649 Anemia, unspecified: Secondary | ICD-10-CM | POA: Diagnosis not present

## 2020-05-10 DIAGNOSIS — T827XXA Infection and inflammatory reaction due to other cardiac and vascular devices, implants and grafts, initial encounter: Secondary | ICD-10-CM | POA: Diagnosis not present

## 2020-05-10 LAB — CBC
HCT: 30.9 % — ABNORMAL LOW (ref 39.0–52.0)
Hemoglobin: 9.6 g/dL — ABNORMAL LOW (ref 13.0–17.0)
MCH: 30.3 pg (ref 26.0–34.0)
MCHC: 31.1 g/dL (ref 30.0–36.0)
MCV: 97.5 fL (ref 80.0–100.0)
Platelets: 377 10*3/uL (ref 150–400)
RBC: 3.17 MIL/uL — ABNORMAL LOW (ref 4.22–5.81)
RDW: 20.8 % — ABNORMAL HIGH (ref 11.5–15.5)
WBC: 7.8 10*3/uL (ref 4.0–10.5)
nRBC: 0 % (ref 0.0–0.2)

## 2020-05-10 LAB — BASIC METABOLIC PANEL
Anion gap: 9 (ref 5–15)
BUN: 7 mg/dL (ref 6–20)
CO2: 25 mmol/L (ref 22–32)
Calcium: 9.4 mg/dL (ref 8.9–10.3)
Chloride: 102 mmol/L (ref 98–111)
Creatinine, Ser: 0.84 mg/dL (ref 0.61–1.24)
GFR, Estimated: >60 mL/min (ref >60)
Glucose, Bld: 104 mg/dL — ABNORMAL HIGH (ref 70–99)
Potassium: 3.4 mmol/L — ABNORMAL LOW (ref 3.5–5.1)
Sodium: 136 mmol/L (ref 135–145)

## 2020-05-10 LAB — PROTIME-INR
INR: 1.3 — ABNORMAL HIGH (ref 0.8–1.2)
Prothrombin Time: 15.4 seconds — ABNORMAL HIGH (ref 11.4–15.2)

## 2020-05-10 LAB — HEPARIN LEVEL (UNFRACTIONATED): Heparin Unfractionated: 0.37 IU/mL (ref 0.30–0.70)

## 2020-05-10 LAB — MAGNESIUM: Magnesium: 1.8 mg/dL (ref 1.7–2.4)

## 2020-05-10 LAB — LACTATE DEHYDROGENASE: LDH: 202 U/L — ABNORMAL HIGH (ref 98–192)

## 2020-05-10 MED ORDER — WARFARIN SODIUM 10 MG PO TABS
10.0000 mg | ORAL_TABLET | Freq: Once | ORAL | Status: AC
  Administered 2020-05-10: 10 mg via ORAL
  Filled 2020-05-10: qty 1

## 2020-05-10 MED ORDER — MAGNESIUM SULFATE 2 GM/50ML IV SOLN
2.0000 g | Freq: Once | INTRAVENOUS | Status: AC
  Administered 2020-05-10: 2 g via INTRAVENOUS
  Filled 2020-05-10: qty 50

## 2020-05-10 MED ORDER — POTASSIUM CHLORIDE CRYS ER 20 MEQ PO TBCR
40.0000 meq | EXTENDED_RELEASE_TABLET | Freq: Once | ORAL | Status: AC
  Administered 2020-05-10: 40 meq via ORAL
  Filled 2020-05-10: qty 2

## 2020-05-10 NOTE — Progress Notes (Signed)
ANTICOAGULATION CONSULT NOTE  Pharmacy Consult for warfarin + heparin Indication: LVAD  No Known Allergies  Patient Measurements: Height: 5\' 5"  (165.1 cm) Weight: 54.8 kg (120 lb 13 oz) IBW/kg (Calculated) : 61.5  Heparin dosing weight: 55.2 kg  Vital Signs: Temp: 98 F (36.7 C) (04/06 0318) Temp Source: Oral (04/06 0318) BP: 101/66 (04/06 0318) Pulse Rate: 89 (04/06 0318)  Labs: Recent Labs    05/08/20 0351 05/08/20 1157 05/09/20 0041 05/09/20 0754 05/10/20 0045  HGB 8.9*  --  9.3*  --  9.6*  HCT 28.5*  --  29.5*  --  30.9*  PLT 367  --  371  --  377  LABPROT 13.7  --  13.8  --  15.4*  INR 1.1  --  1.1  --  1.3*  HEPARINUNFRC 0.17*   < > 0.12* 0.34 0.37  CREATININE 0.81  --  0.73  --   --    < > = values in this interval not displayed.    Estimated Creatinine Clearance: 100.8 mL/min (by C-G formula based on SCr of 0.73 mg/dL).   Medical History: Past Medical History:  Diagnosis Date  . Dilated cardiomyopathy (HCC)   . Dilated cardiomyopathy (HCC) 02/2019  . 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 PTA regimen is 2.5 mg daily. He is now s/p dental extractions on 3/31.  INR remains subtherapeutic at 1 today despite much higher doses than PTA. No overt bleeding noted. Eating well. Hgb has stabilized 9-10s, pltc wnl. LDH ~ 200, stable.  Heparin level 0.37 this am on heaprin drip 1000 uts/hr. No bleeding or IV issues noted.   Goal of Therapy:  Heparin level ~0.3 INR 2-2.5 Monitor platelets by anticoagulation protocol: Yes   Plan:  Continue IV heparin at 1000 units/hr Warfarin 10 mg po x 1 again tonight  Daily heparin level, CBC, and INR.  4/31, Reece Leader, BCCP Clinical Pharmacist  05/10/2020 8:15 AM   [facility] pharmacy phone numbers are listed on [email or web address]

## 2020-05-10 NOTE — Progress Notes (Addendum)
Patient ID: Jon Martin, male   DOB: 1985/04/14, 35 y.o.   MRN: [id number]   Advanced Heart Failure VAD Team Note  PCP-Cardiologist: No primary care provider on file.   Subjective:     HM3 LVAD admitted w/ Drive-line infection.  - Wound culture with S aureus. Blood cx NGTD - 3/26 abx narrowed to cefazolin 2gm IV Q 8hr. ID following  - 3/28 CT abd/pelvis soft tissue no deep infection. Oral Surgeon consulted.  - 3/31 s/p extractions of teeth #5 and 15  - 3/31 Low flow alarms last night, got 1 L IVF and low flows have stopped - 4/5 small pustule on abdomen noted>> performed superficial I&D at bedside, no communication with DL tunnel, cultures sent and pending    Remains on heparin drip + coumadin.  INR 1.1>>1.3 today.   Feels ok. No complaints.   LVAD INTERROGATION:   HeartMate 3 LVAD:   Flow 4.1liters/min, speed 5450, power 3.8  PI 4.3 7 PI events   VAD interrogated personally. Parameters stable.  Objective:    Vital Signs:   Temp:  [98 F (36.7 C)-98.3 F (36.8 C)] 98 F (36.7 C) (04/06 0318) Pulse Rate:  [79-98] 89 (04/06 0318) Resp:  [17-19] 17 (04/06 0318) BP: (88-110)/(65-89) 101/66 (04/06 0318) SpO2:  [97 %-99 %] 99 % (04/06 0318) Weight:  [54.8 kg] 54.8 kg (04/06 0630) Last BM Date: 05/03/20   Mean arterial Pressure 70s-80s  Intake/Output:   Intake/Output Summary (Last 24 hours) at 05/10/2020 0800 Last data filed at 05/10/2020 0630 Gross per 24 hour  Intake 929.44 ml  Output 675 ml  Net 254.44 ml     Physical Exam   General:  Thin but well appearing young AAM   HEENT: normal  Neck: supple. JVP not elevated.  Carotids 2+ bilat; no bruits. No lymphadenopathy or thryomegaly appreciated. Cor: LVAD hum.  Lungs: CTAB, no wheezing  Abdomen: soft, nontender, non-distended. No hepatosplenomegaly. No bruits or masses. Abdominal wound bandaged w/ mild serous drainage Good bowel sounds.  Driveline site clean. Anchor in place.   Extremities: no cyanosis, clubbing,  rash. Warm no edema  Neuro: alert & oriented x 3. No focal deficits. Moves all 4 without problem   Telemetry   SR 80-90s Personally reviewed   Labs   Basic Metabolic Panel: Recent Labs  Lab 05/05/20 0037 05/06/20 0051 05/07/20 0053 05/08/20 0351 05/09/20 0041 05/10/20 0045  NA 136 137 136 137 135  --   K 3.6 3.7 3.3* 4.0 3.9  --   CL 104 105 104 104 101  --   CO2 26 26 25 24 27   --   GLUCOSE 127* 98 116* 125* 101*  --   BUN <5* 5* <5* <5* 5*  --   CREATININE 0.68 0.79 0.88 0.81 0.73  --   CALCIUM 8.8* 9.0 9.0 9.3 9.4  --   MG 1.7 2.2 1.6* 1.8 2.0 1.8    Liver Function Tests: No results for input(s): AST, ALT, ALKPHOS, BILITOT, PROT, ALBUMIN in the last 168 hours. No results for input(s): LIPASE, AMYLASE in the last 168 hours. No results for input(s): AMMONIA in the last 168 hours.  CBC: Recent Labs  Lab 05/06/20 0051 05/07/20 0053 05/08/20 0351 05/09/20 0041 05/10/20 0045  WBC 5.2 6.9 7.9 8.7 7.8  HGB 9.0* 9.6* 8.9* 9.3* 9.6*  HCT 28.9* 30.6* 28.5* 29.5* 30.9*  MCV 98.0 99.0 98.3 95.8 97.5  PLT 277 372 367 371 377    INR: Recent Labs  Lab  05/06/20 0051 05/07/20 0053 05/08/20 0351 05/09/20 0041 05/10/20 0045  INR 1.0 1.3* 1.1 1.1 1.3*    Other results:    Imaging   No results found.   Medications:     Scheduled Medications: . losartan  25 mg Oral Daily  . pantoprazole  40 mg Oral Daily  . sodium chloride flush  3 mL Intravenous Q12H  . Warfarin - Pharmacist Dosing Inpatient   Does not apply q1600    Infusions: . sodium chloride    . heparin 1,000 Units/hr (05/10/20 0400)  . lactated ringers 10 mL/hr at 30-May-2020 0921  . lactated ringers 10 mL/hr at 05/30/20 1136    PRN Medications: sodium chloride, acetaminophen, ondansetron (ZOFRAN) IV   Patient Profile   35 y/o male w/ chronic systolic HF s/p HM3 LVAD admitted w/ staph aureus drive-line infection   Assessment/Plan:    1. 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 -  Admitted for IV abx vancomycin/cefepime/Flagyl - Driveline culture with S aureus. Blood cx NGTD  - 3/27 abx narrowed to cefazolin 2gm IV Q 8hr => stopped 05-31-22 and got ortivanvin 4/1  - CT abd/pelvis- soft tissue infection. No deep infection - 4/5 small pustule on abdomen noted >> performed superficial I&D at bedside, no communication with DL tunnel, cultures sent and pending. Mild serous drainage present today. Await culture data    2.  Chronic Systolic HF due to NICM - Echo EF 10% with biventricular failure - HM-3 VAD implant 02/26/19 - Echo 7/21 EF 40-45% - No bb or ARB with previous dehydration/RV failure.  - VAD interrogated personally. Parameters stable. - MAPs 70s-80s - Continue losartan-  - INR 1.3 LDH 202 Continue  IV heparin + warfarin until INR >= 1.8.  Discussed dosing with PharmD personally. -Needs to walk today an reassess HR response to exercise (had been overly brisk).  - Ambulate w/ CR  3. Anemia - Baseline hgb has been upper 9's, admission hgb 7.7 => 6.1.  However, hgb up to 10.5 after 2 units PRCs and stool was heme negative.  Does not appear to be actively GI bleeding, ?initial drop dilutional with IVF at admission.   - Hgb 9.6 today. No obvious source.   4. F/E/N - We continue to worry about his nutritional status after his mother died.  5. Broken Teeth - 2022/05/31 s/p extractions of teeth #5 and 15  - Appreciate dental surgery  - site looks good  6. Hypokalemia/ Hypomagnesemia  - Mg 1.8 will supp - BMP pending, supp K if needed    I reviewed the LVAD parameters from today, and compared the results to the patient's prior recorded data.  No programming changes were made.  The LVAD is functioning within specified parameters.  The patient performs LVAD self-test daily.  LVAD interrogation was negative for any significant power changes, alarms or PI events/speed drops.  LVAD equipment check completed and is in good  working order.  Back-up equipment present.   LVAD education done on emergency procedures and precautions and reviewed exit site care.  Length of Stay: [address] PA-C 05/10/2020, 8:00 AM  VAD Team --- VAD ISSUES ONLY--- Pager [phone] (7am - 7am)  Advanced Heart Failure Team  Pager [phone] (M-F; 7a - 5p)  Please contact [facility] for night-coverage after hours (5p -7a ) and weekends on [email or web address]   Patient seen and examined with the above-signed Advanced Practice Provider and/or Housestaff. I personally reviewed  laboratory data, imaging studies and relevant notes. I independently examined the patient and formulated the important aspects of the plan. I have edited the note to reflect any of my changes or salient points. I have personally discussed the plan with the patient and/or family.  Remains on heparin/warfarin. INR 1.1 -> 1.3. Feels ok. No CP or SOB. Had small pustule along driveline course lanced. Gram stain rare GPC. Remains AF. Received  ortivancin 4/1   General:  NAD.  HEENT: normal  Neck: supple. JVP not elevated.  Carotids 2+ bilat; no bruits. No lymphadenopathy or thryomegaly appreciated. Cor: LVAD hum.  Lungs: Clear. Abdomen: soft, nontender, non-distended. No hepatosplenomegaly. No bruits or masses. Good bowel sounds. Driveline site clean. Anchor in place.  Extremities: no cyanosis, clubbing, rash. Warm no edema  Neuro: alert & oriented x 3. No focal deficits. Moves all 4 without problem   Continue heparin/warfarin load. Watch culture data. Walk today with CR to re-assess HR response to activity. (has been getting tachycardic with minimal activity).   Arvilla Meres, MD  8:42 AM

## 2020-05-10 NOTE — Progress Notes (Signed)
Ambulated along the hallway about 350 feet , heart rate went up to 130's-140's  Denied any discomfort. Assisted back to bed with heart rate still  on 130's  for few min and slowly down to 120 's. Continue to monitor/.

## 2020-05-10 NOTE — Progress Notes (Signed)
Latest potassium level-3.4 PA made aware with order.

## 2020-05-10 NOTE — Plan of Care (Signed)
?  Problem: Education: ?Goal: Knowledge of General Education information will improve ?Description: Including pain rating scale, medication(s)/side effects and non-pharmacologic comfort measures ?Outcome: Progressing ?  ?Problem: Health Behavior/Discharge Planning: ?Goal: Ability to manage health-related needs will improve ?Outcome: Progressing ?  ?Problem: Clinical Measurements: ?Goal: Ability to maintain clinical measurements within normal limits will improve ?Outcome: Progressing ?Goal: Will remain free from infection ?Outcome: Progressing ?Goal: Diagnostic test results will improve ?Outcome: Progressing ?Goal: Respiratory complications will improve ?Outcome: Progressing ?Goal: Cardiovascular complication will be avoided ?Outcome: Progressing ?  ?Problem: Activity: ?Goal: Risk for activity intolerance will decrease ?Outcome: Progressing ?  ?Problem: Nutrition: ?Goal: Adequate nutrition will be maintained ?Outcome: Progressing ?  ?Problem: Coping: ?Goal: Level of anxiety will decrease ?Outcome: Progressing ?  ?Problem: Elimination: ?Goal: Will not experience complications related to bowel motility ?Outcome: Progressing ?Goal: Will not experience complications related to urinary retention ?Outcome: Progressing ?  ?Problem: Pain Managment: ?Goal: General experience of comfort will improve ?Outcome: Progressing ?  ?Problem: Safety: ?Goal: Ability to remain free from injury will improve ?Outcome: Progressing ?  ?Problem: Skin Integrity: ?Goal: Risk for impaired skin integrity will decrease ?Outcome: Progressing ?  ?Problem: Education: ?Goal: Patient will understand all VAD equipment and how it functions ?Outcome: Progressing ?Goal: Patient will be able to verbalize current INR target range and antiplatelet therapy for discharge home ?Outcome: Progressing ?  ?Problem: Cardiac: ?Goal: LVAD will function as expected and patient will experience no clinical alarms ?Outcome: Progressing ?  ?

## 2020-05-10 NOTE — Progress Notes (Signed)
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   HM III LVAD implanted on 02/26/19 by Dr. Maren Beach under Destination Therapy criteria.  Patient awake, alert, pleasant this am. He denies complaints, says he feels good today.   Vital signs: Temp: 98.6 HR: 89 Doppler Pressure: 78 Automatic BP:  Not documeted O2 Sat: 98% RA  Wt: 122.8>123>124.1>120.8>121.4>121.6>121.9>123.2>121.9>120.8 lbs   LVAD interrogation reveals:  Speed: 5400 Flow: 4.2  Power: 3.9w PI: 4.0 Hct: 20 - do not change  Alarms: none Events:   Fixed speed: 5400 Low speed limit: 5100  Drive Line: Dressing C/D/I with anchor intact and accurately applied. Weekly Weekly dressing changes per BS nurse. Next dressing change due 05/16/20.  Labs:  LDH trend: 181>171>185>196>193>295>233>213>222  INR trend: 2.3>1.7>1.2>1.1>1.1>1.0>1.0>1.1  Anticoagulation Plan: -INR Goal: 2.0 - 2.5 -ASA Dose: none  Blood Products:  - 04/29/20>>2 units PCs  Device: N/A  Infection:  - 04/28/20 BCs>> No growth x 3 days - 04/28/20 DL culture>>rare staph aureus; final  - 05/04/20 BC>> No growth, final - 05/09/20 Abdominal abscess culture>> rare gm positive cocci  Drips:  Heparin @ 1000 units/hr  Plan/Recommendations:  1. Call VAD Coordinator if any VAD equipment or drive line issues. 2. Weekly dressing changes on driveline. Please change the abscess dressing every day or as needed if soiled.    [name] RN VAD Coordinator  Office: [phone]  24/7 Pager: [phone]

## 2020-05-11 ENCOUNTER — Telehealth (HOSPITAL_COMMUNITY): Payer: Self-pay | Admitting: Licensed Clinical Social Worker

## 2020-05-11 DIAGNOSIS — T827XXA Infection and inflammatory reaction due to other cardiac and vascular devices, implants and grafts, initial encounter: Secondary | ICD-10-CM | POA: Diagnosis not present

## 2020-05-11 DIAGNOSIS — K08199 Complete loss of teeth due to other specified cause, unspecified class: Secondary | ICD-10-CM

## 2020-05-11 DIAGNOSIS — Z95811 Presence of heart assist device: Secondary | ICD-10-CM

## 2020-05-11 DIAGNOSIS — I5022 Chronic systolic (congestive) heart failure: Secondary | ICD-10-CM | POA: Diagnosis not present

## 2020-05-11 LAB — CBC
HCT: 29.7 % — ABNORMAL LOW (ref 39.0–52.0)
Hemoglobin: 9.3 g/dL — ABNORMAL LOW (ref 13.0–17.0)
MCH: 30.6 pg (ref 26.0–34.0)
MCHC: 31.3 g/dL (ref 30.0–36.0)
MCV: 97.7 fL (ref 80.0–100.0)
Platelets: 355 10*3/uL (ref 150–400)
RBC: 3.04 MIL/uL — ABNORMAL LOW (ref 4.22–5.81)
RDW: 20.9 % — ABNORMAL HIGH (ref 11.5–15.5)
WBC: 7.9 10*3/uL (ref 4.0–10.5)
nRBC: 0 % (ref 0.0–0.2)

## 2020-05-11 LAB — AEROBIC CULTURE W GRAM STAIN (SUPERFICIAL SPECIMEN)

## 2020-05-11 LAB — HEPARIN LEVEL (UNFRACTIONATED): Heparin Unfractionated: 0.41 IU/mL (ref 0.30–0.70)

## 2020-05-11 LAB — BASIC METABOLIC PANEL
Anion gap: 8 (ref 5–15)
BUN: 8 mg/dL (ref 6–20)
CO2: 24 mmol/L (ref 22–32)
Calcium: 9.6 mg/dL (ref 8.9–10.3)
Chloride: 104 mmol/L (ref 98–111)
Creatinine, Ser: 0.75 mg/dL (ref 0.61–1.24)
GFR, Estimated: >60 mL/min (ref >60)
Glucose, Bld: 112 mg/dL — ABNORMAL HIGH (ref 70–99)
Potassium: 3.6 mmol/L (ref 3.5–5.1)
Sodium: 136 mmol/L (ref 135–145)

## 2020-05-11 LAB — MAGNESIUM: Magnesium: 1.8 mg/dL (ref 1.7–2.4)

## 2020-05-11 LAB — PROTIME-INR
INR: 1.5 — ABNORMAL HIGH (ref 0.8–1.2)
Prothrombin Time: 17.7 seconds — ABNORMAL HIGH (ref 11.4–15.2)

## 2020-05-11 LAB — LACTATE DEHYDROGENASE: LDH: 197 U/L — ABNORMAL HIGH (ref 98–192)

## 2020-05-11 MED ORDER — POTASSIUM CHLORIDE CRYS ER 20 MEQ PO TBCR
40.0000 meq | EXTENDED_RELEASE_TABLET | Freq: Once | ORAL | Status: AC
  Administered 2020-05-11: 40 meq via ORAL
  Filled 2020-05-11: qty 2

## 2020-05-11 MED ORDER — CARVEDILOL 3.125 MG PO TABS
3.1250 mg | ORAL_TABLET | Freq: Two times a day (BID) | ORAL | Status: DC
  Administered 2020-05-11 – 2020-05-12 (×3): 3.125 mg via ORAL
  Filled 2020-05-11 (×3): qty 1

## 2020-05-11 MED ORDER — WARFARIN SODIUM 10 MG PO TABS
10.0000 mg | ORAL_TABLET | Freq: Once | ORAL | Status: AC
  Administered 2020-05-11: 10 mg via ORAL
  Filled 2020-05-11: qty 1

## 2020-05-11 MED ORDER — MAGNESIUM SULFATE 4 GM/100ML IV SOLN
4.0000 g | Freq: Once | INTRAVENOUS | Status: AC
  Administered 2020-05-11: 4 g via INTRAVENOUS
  Filled 2020-05-11: qty 100

## 2020-05-11 NOTE — Progress Notes (Signed)
Department of Dental Medicine     POSTOPERATIVE VISIT   Service Date:   05/11/2020  Patient Name:  Jon Martin Date of Birth:   05-20-1985 Medical Record Number: [id number]   PLAN & RECOMMENDATIONS   > The patient continues to heal well and consistent with dental procedures performed. >> Recommend the patient establish care at a dental office of their choice for routine dental care including replacement of missing teeth, cleanings/periodontal therapy and periodic exams. >>> Plan to follow-up as needed.  >>  Discussed in detail all treatment options with the patient and they are agreeable to the plan.   05/11/2020     POSTOPERATIVE NOTE   COVID 19 SCREENING: The patient denies symptoms concerning for COVID-19 infection including fever, chills, cough, or newly developed shortness of breath.   HISTORY OF PRESENT ILLNESS: > Jon Martin was seen today for an inpatient postoperative visit s/p extractions of retained root tips #5 and #15 in the operating room on 3/31.   >> Medical and dental history reviewed with the patient.  He reports that he is anticipating discharge to home today.   CHIEF COMPLAINT:  He has no complaints and says that he has been doing well since dental surgery.   Patient Active Problem List   Diagnosis Date Noted  . Retained dental root   . Chronic periodontitis   . Complication involving left ventricular assist device (LVAD) 04/28/2020  . Dental caries 04/28/2020  . 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/2020  . Left ventricular assist device (LVAD) complication 01/12/2020  . Dehydration, moderate 08/02/2019  . Chronic systolic heart failure (HCC) 08/02/2019  . AKI (acute kidney injury) (HCC) 08/02/2019  . Intractable vomiting with nausea 08/02/2019  . LVAD (left ventricular assist device) present (HCC) 08/02/2019  . Hypokalemia 08/02/2019  . Congestive heart failure (HCC)   . Cardiogenic  shock (HCC)   . Goals of care, counseling/discussion   . Preoperative evaluation to rule out surgical contraindication   . Protein-calorie malnutrition, severe 02/11/2019  . Dilated cardiomyopathy (HCC) 02/10/2019   Past Medical History:  Diagnosis Date  . Dilated cardiomyopathy (HCC)   . Dilated cardiomyopathy (HCC) 02/2019  . Polysubstance abuse [facility])    Past Surgical History:  Procedure Laterality Date  . INSERTION OF IMPLANTABLE LEFT VENTRICULAR ASSIST DEVICE N/A 02/26/2019   Procedure: INSERTION OF IMPLANTABLE LEFT VENTRICULAR ASSIST DEVICE;  Surgeon: Kerin Perna, MD;  Location: [facility] OR;  Service: Open Heart Surgery;  Laterality: N/A;  HM3  . IR THORACENTESIS ASP PLEURAL SPACE W/IMG GUIDE  03/10/2019  . MULTIPLE EXTRACTIONS WITH ALVEOLOPLASTY N/A 05/04/2020   Procedure: MULTIPLE EXTRACTION TEETH FIVE AND FIFTEEN WITH ALVEOLOPLASTY;  Surgeon: Sharman Cheek, DMD;  Location: [facility] OR;  Service: Dentistry;  Laterality: N/A;  . RIGHT HEART CATH N/A 02/25/2019   Procedure: RIGHT HEART CATH;  Surgeon: Dolores Patty, MD;  Location: [facility] INVASIVE CV LAB;  Service: Cardiovascular;  Laterality: N/A;  . RIGHT/LEFT HEART CATH AND CORONARY ANGIOGRAPHY N/A 02/11/2019   Procedure: RIGHT/LEFT HEART CATH AND CORONARY ANGIOGRAPHY;  Surgeon: Dolores Patty, MD;  Location: [facility] INVASIVE CV LAB;  Service: Cardiovascular;  Laterality: N/A;  . TEE WITHOUT CARDIOVERSION N/A 02/26/2019   Procedure: TRANSESOPHAGEAL ECHOCARDIOGRAM (TEE);  Surgeon: Donata Clay, Theron Arista, MD;  Location: [facility] OR;  Service: Open Heart Surgery;  Laterality: N/A;  . VIDEO BRONCHOSCOPY N/A 02/26/2019   Procedure: Video Bronchoscopy;  Surgeon: Kerin Perna, MD;  Location: [facility] OR;  Service: Open Heart Surgery;  Laterality: N/A;   Current Facility-Administered Medications  Medication Dose Route Frequency Provider Last Rate Last Admin  . 0.9 %  sodium chloride infusion  250 mL Intravenous PRN Clegg, Amy D, NP      . acetaminophen  (TYLENOL) tablet 650 mg  650 mg Oral Q4H PRN Clegg, Amy D, NP      . carvedilol (COREG) tablet 3.125 mg  3.125 mg Oral BID WC Bensimhon, Bevelyn Buckles, MD   3.125 mg at 05/11/20 0831  . heparin ADULT infusion 100 units/mL (25000 units/246mL)  900 Units/hr Intravenous Continuous Bensimhon, Bevelyn Buckles, MD 9 mL/hr at 05/11/20 1422 900 Units/hr at 05/11/20 1422  . lactated ringers infusion   Intravenous Continuous Kipp Brood, MD 10 mL/hr at 05/04/20 0921 New Bag at 05/04/20 1011  . lactated ringers infusion   Intravenous Continuous Zanyla Klebba B, DMD 10 mL/hr at 05/04/20 1136 New Bag at 05/04/20 1136  . losartan (COZAAR) tablet 25 mg  25 mg Oral Daily Laurey Morale, MD   25 mg at 05/11/20 0725  . ondansetron (ZOFRAN) injection 4 mg  4 mg Intravenous Q6H PRN Clegg, Amy D, NP      . pantoprazole (PROTONIX) EC tablet 40 mg  40 mg Oral Daily Clegg, Amy D, NP   40 mg at 05/11/20 0725  . sodium chloride flush (NS) 0.9 % injection 3 mL  3 mL Intravenous Q12H Clegg, Amy D, NP   3 mL at 05/11/20 0725  . warfarin (COUMADIN) tablet 10 mg  10 mg Oral ONCE-1600 Bensimhon, Bevelyn Buckles, MD      . Warfarin - Pharmacist Dosing Inpatient   Does not apply q1600 Earnie Larsson, [facility]   Given at 05/10/20 1601   No Known Allergies  LABS: Lab Results  Component Value Date   WBC 7.9 05/11/2020   HGB 9.3 (L) 05/11/2020   HCT 29.7 (L) 05/11/2020   MCV 97.7 05/11/2020   PLT 355 05/11/2020   BMET    Component Value Date/Time   NA 136 05/11/2020 0115   K 3.6 05/11/2020 0115   CL 104 05/11/2020 0115   CO2 24 05/11/2020 0115   GLUCOSE 112 (H) 05/11/2020 0115   BUN 8 05/11/2020 0115   CREATININE 0.75 05/11/2020 0115   CALCIUM 9.6 05/11/2020 0115   GFRNONAA >60 05/11/2020 0115   GFRAA >60 10/20/2019 1102    Lab Results  Component Value Date   INR 1.5 (H) 05/11/2020   INR 1.3 (H) 05/10/2020   INR 1.1 05/09/2020   No results found for: PTT   VITALS: BP (!) 83/70 (BP Location: Right Arm)   Pulse 99   Temp  98.1 F (36.7 C) (Oral)   Resp 17   Ht 5\' 5"  (1.651 m)   Wt 55.4 kg   SpO2 97%   BMI 20.32 kg/m    EXAM: >> Extraction sites appear to be healing WNL.  No signs of wound dehiscence or infection evident upon examination.   ASSESSMENT:  Postoperative course is consistent with dental procedures performed.  1. Loss of teeth due to Extraction   PLAN: > Plan to follow-up as needed.   >> Recommend that the patient find an outside dental provider of their choice for routine dental care including replacement of missing teeth, cleanings/periodontal therapy and exams.  Recommend that they discuss plans to return to the dentist for non-urgent treatment with their medical team to ensure they are medically optimized and there are no contraindications. >>>  Patient instructed to call should any questions or concerns arise.   <> The patient verbalized understanding of discussion and plan.  All questions and concerns were addressed.   <> The patient tolerated today's visit well and departed in stable condition.  Doctor Sheahan B. Chales Salmon, DMD

## 2020-05-11 NOTE — Plan of Care (Signed)
?  Problem: Education: ?Goal: Knowledge of General Education information will improve ?Description: Including pain rating scale, medication(s)/side effects and non-pharmacologic comfort measures ?Outcome: Progressing ?  ?Problem: Health Behavior/Discharge Planning: ?Goal: Ability to manage health-related needs will improve ?Outcome: Progressing ?  ?Problem: Clinical Measurements: ?Goal: Ability to maintain clinical measurements within normal limits will improve ?Outcome: Progressing ?Goal: Will remain free from infection ?Outcome: Progressing ?Goal: Diagnostic test results will improve ?Outcome: Progressing ?Goal: Respiratory complications will improve ?Outcome: Progressing ?Goal: Cardiovascular complication will be avoided ?Outcome: Progressing ?  ?Problem: Activity: ?Goal: Risk for activity intolerance will decrease ?Outcome: Progressing ?  ?Problem: Nutrition: ?Goal: Adequate nutrition will be maintained ?Outcome: Progressing ?  ?Problem: Coping: ?Goal: Level of anxiety will decrease ?Outcome: Progressing ?  ?Problem: Elimination: ?Goal: Will not experience complications related to bowel motility ?Outcome: Progressing ?Goal: Will not experience complications related to urinary retention ?Outcome: Progressing ?  ?Problem: Pain Managment: ?Goal: General experience of comfort will improve ?Outcome: Progressing ?  ?Problem: Safety: ?Goal: Ability to remain free from injury will improve ?Outcome: Progressing ?  ?Problem: Skin Integrity: ?Goal: Risk for impaired skin integrity will decrease ?Outcome: Progressing ?  ?Problem: Education: ?Goal: Patient will understand all VAD equipment and how it functions ?Outcome: Progressing ?Goal: Patient will be able to verbalize current INR target range and antiplatelet therapy for discharge home ?Outcome: Progressing ?  ?Problem: Cardiac: ?Goal: LVAD will function as expected and patient will experience no clinical alarms ?Outcome: Progressing ?  ?
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
## 2020-05-11 NOTE — Telephone Encounter (Signed)
CSW  Referred to assist patient with transportation needs for follow up appointment post hospitalization. Patient will need to return to the VAD clinic weekly for a few weeks and will need transport. CSW left message for medicaid caseworker Charyl Dancer at [phone] for return call. Lasandra Beech, LCSW, CCSW-MCS [phone]

## 2020-05-11 NOTE — Progress Notes (Addendum)
Patient ID: Jon Martin, male   DOB: July 16, 1985, 35 y.o.   MRN: [id number]   Advanced Heart Failure VAD Team Note  PCP-Cardiologist: No primary care provider on file.   Subjective:     HM3 LVAD admitted w/ Drive-line infection.  - Wound culture with S aureus. Blood cx NGTD - 3/26 abx narrowed to cefazolin 2gm IV Q 8hr. ID following  - 3/28 CT abd/pelvis soft tissue no deep infection. Oral Surgeon consulted.  - 3/31 s/p extractions of teeth #5 and 15  - 3/31 Low flow alarms last night, got 1 L IVF and low flows have stopped - 4/5 small pustule on abdomen noted>> performed superficial I&D at bedside, no communication with DL tunnel, cultures sent and pending    Remains on heparin drip + coumadin.  INR 1.1>>1.3 >>1.5 today.   Feels ok. Walked today.   LVAD INTERROGATION:   HeartMate 3 LVAD:   Flow 4.1liters/min, speed 5400, power 4  PI 3.9  PI events   VAD interrogated personally. Parameters stable.  Objective:    Vital Signs:   Temp:  [98.1 F (36.7 C)-98.6 F (37 C)] 98.1 F (36.7 C) (04/07 0717) Pulse Rate:  [82-100] 97 (04/07 0717) Resp:  [15-20] 19 (04/07 0717) BP: (80-105)/(58-94) 105/94 (04/07 0717) SpO2:  [97 %-100 %] 98 % (04/07 0337) Weight:  [55.4 kg] 55.4 kg (04/07 0439) Last BM Date: 05/03/20   Mean arterial Pressure 70s-80s  Intake/Output:   Intake/Output Summary (Last 24 hours) at 05/11/2020 0736 Last data filed at 05/11/2020 0337 Gross per 24 hour  Intake 1366.13 ml  Output 1650 ml  Net -283.87 ml     Physical Exam   Physical Exam: GENERAL: No acute distress. HEENT: normal  NECK: Supple, JVP flat .  2+ bilaterally, no bruits.  No lymphadenopathy or thyromegaly appreciated.   CARDIAC:  Mechanical heart sounds with LVAD hum present.  LUNGS:  Clear to auscultation bilaterally.  ABDOMEN:  Soft, round, nontender, positive bowel sounds x4.     LVAD exit site:  Dressing dry and intact.  No erythema or drainage.  Stabilization device present and  accurately applied.  Driveline dressing is being changed daily per sterile technique. EXTREMITIES:  Warm and dry, no cyanosis, clubbing, rash or edema  NEUROLOGIC:  Alert and oriented x 3.    No aphasia.  No dysarthria.  Affect pleasant.     Telemetry   SR-ST 80-100s with occasional PVCs.   Labs   Basic Metabolic Panel: Recent Labs  Lab 05/07/20 0053 05/08/20 0351 05/09/20 0041 05/10/20 0045 05/10/20 0831 05/11/20 0115  NA 136 137 135  --  136 136  K 3.3* 4.0 3.9  --  3.4* 3.6  CL 104 104 101  --  102 104  CO2 25 24 27   --  25 24  GLUCOSE 116* 125* 101*  --  104* 112*  BUN <5* <5* 5*  --  7 8  CREATININE 0.88 0.81 0.73  --  0.84 0.75  CALCIUM 9.0 9.3 9.4  --  9.4 9.6  MG 1.6* 1.8 2.0 1.8  --  1.8    Liver Function Tests: No results for input(s): AST, ALT, ALKPHOS, BILITOT, PROT, ALBUMIN in the last 168 hours. No results for input(s): LIPASE, AMYLASE in the last 168 hours. No results for input(s): AMMONIA in the last 168 hours.  CBC: Recent Labs  Lab 05/07/20 0053 05/08/20 0351 05/09/20 0041 05/10/20 0045 05/11/20 0115  WBC 6.9 7.9 8.7 7.8 7.9  HGB 9.6*  8.9* 9.3* 9.6* 9.3*  HCT 30.6* 28.5* 29.5* 30.9* 29.7*  MCV 99.0 98.3 95.8 97.5 97.7  PLT 372 367 371 377 355    INR: Recent Labs  Lab 05/07/20 0053 05/08/20 0351 05/09/20 0041 05/10/20 0045 05/11/20 0115  INR 1.3* 1.1 1.1 1.3* 1.5*    Other results:    Imaging   No results found.   Medications:     Scheduled Medications: . losartan  25 mg Oral Daily  . pantoprazole  40 mg Oral Daily  . sodium chloride flush  3 mL Intravenous Q12H  . Warfarin - Pharmacist Dosing Inpatient   Does not apply q1600    Infusions: . sodium chloride    . heparin 1,000 Units/hr (05/11/20 0337)  . lactated ringers 10 mL/hr at 05/04/20 0921  . lactated ringers 10 mL/hr at 05/04/20 1136    PRN Medications: sodium chloride, acetaminophen, ondansetron (ZOFRAN) IV   Patient Profile   35 y/o male w/ chronic  systolic HF s/p HM3 LVAD admitted w/ staph aureus drive-line infection   Assessment/Plan:    1. 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 -  Admitted for IV abx vancomycin/cefepime/Flagyl - Driveline culture with S aureus. Blood cx NGTD  - 3/27 abx narrowed to cefazolin 2gm IV Q 8hr => stopped 3/31 and got ortivancin 4/1  - CT abd/pelvis- soft tissue infection. No deep infection - 4/5 small pustule on abdomen noted >> performed superficial I&D at bedside, no communication with DL tunnel, cultures sent and pending. Culture results pending.  - Pharmacy discussed with ID. No new recommendations given area was opended and that ortivancin should cover.   2.  Chronic Systolic HF due to NICM - Echo EF 10% with biventricular failure - HM-3 VAD implant 02/26/19 - Echo 7/21 EF 40-45% - No bb or ARB with previous dehydration/RV failure.  - VAD interrogated personally. Parameters stable. - MAPs 80-100s - Add carvedilol 3.125 mg twice a day.  - Continue losartan - INR 1.5  LDH 197 Continue  IV heparin + warfarin until INR >= 1.8.  Discussed dosing with PharmD personally.  3. Anemia - Baseline hgb has been upper 9's, admission hgb 7.7 => 6.1.  However, hgb up to 10.5 after 2 units PRCs and stool was heme negative.  Does not appear to be actively GI bleeding, ?initial drop dilutional with IVF at admission.   - Hgb 9.3 today. No obvious source.   4. F/E/N - We continue to worry about his nutritional status after his mother died. - Appetite has been great in the hospital.  5. Broken Teeth - 3/31 s/p extractions of teeth #5 and 15  - Appreciate dental surgery  - site looks good  6. Hypokalemia/ Hypomagnesemia  - Mg 1.8 ---> Give 4 gram Mag ( he has received IV Mag )  - K 3.6 , supp K   I reviewed the LVAD parameters from today, and compared the results to the patient's prior recorded data.  No programming changes were made.  The LVAD is functioning  within specified parameters.  The patient performs LVAD self-test daily.  LVAD interrogation was negative for any significant power changes, alarms or PI events/speed drops.  LVAD equipment check completed and is in good working order.  Back-up equipment present.   LVAD education done on emergency procedures and precautions and reviewed exit site care.  Length of Stay: 13  Jon Becket, NP 05/11/2020, 7:36 AM  VAD Team --- VAD  ISSUES ONLY--- Pager [phone] (7am - 7am)  Advanced Heart Failure Team  Pager [phone] (M-F; 7a - 5p)  Please contact [facility] for night-coverage after hours (5p -7a ) and weekends on [email or web address]   Patient seen and examined with the above-signed Advanced Practice Provider and/or Housestaff. I personally reviewed laboratory data, imaging studies and relevant notes. I independently examined the patient and formulated the important aspects of the plan. I have edited the note to reflect any of my changes or salient points. I have personally discussed the plan with the patient and/or family.  Feeling ok. Walked halls yesterday and HR up to 130-140s with low level activity. Cultures for boil near driveline back with staph. Denies fevers or chills. Remains on heparin/warfarin. No bleeding INR 1.5  General:  NAD.  HEENT: normal  Neck: supple. JVP not elevated.  Carotids 2+ bilat; no bruits. No lymphadenopathy or thryomegaly appreciated. Cor: LVAD hum.  Lungs: Clear. Abdomen: soft, nontender, non-distended. No hepatosplenomegaly. No bruits or masses. Good bowel sounds. Driveline site clean. Anchor in place.  Extremities: no cyanosis, clubbing, rash. Warm no edema  Neuro: alert & oriented x 3. No focal deficits. Moves all 4 without problem   Spoke with ID. Has received ortivancin which soul cover all pathogens for him. Continue heparin/warfarin. Discussed dosing with PharmD personally. Add low-dose b-blocker. VAD interrogated personally. Parameters stable.  Jon Meres, MD  9:45 AM

## 2020-05-11 NOTE — Progress Notes (Signed)
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   HM III LVAD implanted on 02/26/19 by Dr. Maren Beach under Destination Therapy criteria.  Patient awake, alert, pleasant this am. He denies complaints, says he feels good today. He is asking to go home. I had a long discussion with the pt today about committing to seeing [place] weekly in clinic to manage his driveline for a while. Pt has severe skin excoriations that I think may be contributing to his driveline infections and may have been the cause for the abscess. Pt states that he will commit to seeing [place] weekly.  Vital signs: Temp: 98.1 HR: 97 Doppler Pressure: 104 Automatic BP:  105/94 (100) O2 Sat: 98% RA  Wt: 122.8>123>124.1>120.8>121.4>121.6>121.9>123.2>121.9>120.8>122 lbs   LVAD interrogation reveals:  Speed: 5400 Flow: 4.2  Power: 3.9w PI: 3.9 Hct: 20 - do not change  Alarms: none Events: 60+ today  Fixed speed: 5400 Low speed limit: 5100  Drive Line: Dressing C/D/I with anchor off. Anchor reapplied. Weekly dressing changes per BS nurse. Next dressing change due 05/16/20.  Abdominal Abcsess: 2 x 2 removed. Expressed moderate amount of bloody drainage. Cleaned with betadine and covered with 2, 2 x 2 dressings and silk tape.    Labs:  LDH trend: 181>171>185>196>193>295>233>213>222>197  INR trend: 2.3>1.7>1.2>1.1>1.1>1.0>1.0>1.1>1.5  Anticoagulation Plan: -INR Goal: 2.0 - 2.5 -ASA Dose: none  Blood Products:  - 04/29/20>>2 units PCs  Device: N/A  Infection:  - 04/28/20 BCs>> No growth final - 04/28/20 DL culture>>rare staph aureus; final  - 05/04/20 BC>> No growth, final - 05/09/20 Abdominal abscess culture>> rare Staph aureus  Drips:  Heparin @ 900 units/hr  Plan/Recommendations:  1. Call VAD Coordinator if any VAD equipment or drive line issues. 2. Weekly dressing changes on driveline. Please change the abscess dressing every day or as needed if  soiled.  3. pts hosp f/u scheduled for 4/14 @ 1100.   Carlton Adam RN VAD Coordinator  Office: [phone]  24/7 Pager: [phone]

## 2020-05-11 NOTE — Progress Notes (Signed)
Pharmacy Antibiotic Note  Jon Martin is a 35 y.o. male with Hx HF and LVAD HM3 placed 1/21 admitted on 04/28/2020 with  Drive line infection with staph aureus.  Per ID patient received a dose of oritavancin 4/2. 4/5 boil noted on patients abdomen and was lanced> grew staph aureus  Discussed with ID - with long half life of oritavancin no need for additional antibiotics to caver small abscess.  WBC wnl, afebrile, Cr 0.7   Plan: No additional ABX at this time  Continue wound care and follow    Height: 5\' 5"  (165.1 cm) Weight: 55.4 kg (122 lb 2.2 oz) IBW/kg (Calculated) : 61.5  Temp (24hrs), Avg:98.3 F (36.8 C), Min:98.1 F (36.7 C), Max:98.5 F (36.9 C)  Recent Labs  Lab 05/07/20 0053 05/08/20 0351 05/09/20 0041 05/10/20 0045 05/10/20 0831 05/11/20 0115  WBC 6.9 7.9 8.7 7.8  --  7.9  CREATININE 0.88 0.81 0.73  --  0.84 0.75    Estimated Creatinine Clearance: 102 mL/min (by C-G formula based on SCr of 0.75 mg/dL).    No Known Allergies  Antimicrobials this admission:   Dose adjustments this admission: Vanc 3/25 > 3/27 Cefepime 3/25 > 3/27 Flagyl 3/25>3/27 Ancef 3/27>4/1 Oritavancin 4/2 x1  Microbiology results:  4/5 abdominal abscess > MSSA 3/25 BCx neg 3/25 DL > MSSA (R: clinda, erythro)    4/25 Pharm.D. CPP, BCPS Clinical Pharmacist [phone] 05/11/2020 12:17 PM

## 2020-05-11 NOTE — Progress Notes (Signed)
ANTICOAGULATION CONSULT NOTE  Pharmacy Consult for warfarin + heparin Indication: LVAD  No Known Allergies  Patient Measurements: Height: 5\' 5"  (165.1 cm) Weight: 55.4 kg (122 lb 2.2 oz) IBW/kg (Calculated) : 61.5  Heparin dosing weight: 55.2 kg  Vital Signs: Temp: 98.1 F (36.7 C) (04/07 1100) Temp Source: Oral (04/07 1100) BP: 83/70 (04/07 1100) Pulse Rate: 99 (04/07 1100)  Labs: Recent Labs    05/09/20 0041 05/09/20 0754 05/10/20 0045 05/10/20 0831 05/11/20 0115  HGB 9.3*  --  9.6*  --  9.3*  HCT 29.5*  --  30.9*  --  29.7*  PLT 371  --  377  --  355  LABPROT 13.8  --  15.4*  --  17.7*  INR 1.1  --  1.3*  --  1.5*  HEPARINUNFRC 0.12* 0.34 0.37  --  0.41  CREATININE 0.73  --   --  0.84 0.75    Estimated Creatinine Clearance: 102 mL/min (by C-G formula based on SCr of 0.75 mg/dL).   Medical History: Past Medical History:  Diagnosis Date  . Dilated cardiomyopathy (HCC)   . Dilated cardiomyopathy (HCC) 02/2019  . Polysubstance abuse (HCC)     Assessment: 32 yom presenting with concern for driveline infection - hx HM3 implanted on 02/26/19 - on warfarin PTA. PTA regimen is 2.5 mg daily. He is now s/p dental extractions on 3/31.  INR remains subtherapeutic at 1.5 today starting to bump with much higher doses than PTA. No overt bleeding noted. Eating well. Hgb has stabilized 9-10s, pltc wnl. LDH ~ 200, stable.  Heparin level up to 0.41 this am on  heaprin drip 1000 uts/hr. No bleeding or IV issues noted.   Goal of Therapy:  Heparin level ~0.3 INR 2-2.5 Monitor platelets by anticoagulation protocol: Yes   Plan:  Decrease  IV heparin to 900 units/hr Warfarin 10 mg po x 1 again tonight  Then plan for DC is back to PTA regimen warfarin 2.5mg  daily - as patient has been compliant and  therapeutic on this dose PTA as he dose not eat well at home Daily heparin level, CBC, and INR.   4/31 Pharm.D. CPP, BCPS Clinical  Pharmacist [phone] 05/11/2020 12:20 PM      [facility] pharmacy phone numbers are listed on [email or web address]

## 2020-05-12 ENCOUNTER — Other Ambulatory Visit (HOSPITAL_COMMUNITY): Payer: Self-pay

## 2020-05-12 ENCOUNTER — Inpatient Hospital Stay (HOSPITAL_COMMUNITY): Payer: Medicaid - Out of State

## 2020-05-12 DIAGNOSIS — L02818 Cutaneous abscess of other sites: Secondary | ICD-10-CM

## 2020-05-12 DIAGNOSIS — I5022 Chronic systolic (congestive) heart failure: Secondary | ICD-10-CM | POA: Diagnosis not present

## 2020-05-12 DIAGNOSIS — T827XXA Infection and inflammatory reaction due to other cardiac and vascular devices, implants and grafts, initial encounter: Secondary | ICD-10-CM | POA: Diagnosis not present

## 2020-05-12 DIAGNOSIS — B9561 Methicillin susceptible Staphylococcus aureus infection as the cause of diseases classified elsewhere: Secondary | ICD-10-CM | POA: Diagnosis not present

## 2020-05-12 LAB — CBC
HCT: 28.5 % — ABNORMAL LOW (ref 39.0–52.0)
Hemoglobin: 8.9 g/dL — ABNORMAL LOW (ref 13.0–17.0)
MCH: 30 pg (ref 26.0–34.0)
MCHC: 31.2 g/dL (ref 30.0–36.0)
MCV: 96 fL (ref 80.0–100.0)
Platelets: 343 10*3/uL (ref 150–400)
RBC: 2.97 MIL/uL — ABNORMAL LOW (ref 4.22–5.81)
RDW: 20.8 % — ABNORMAL HIGH (ref 11.5–15.5)
WBC: 8.2 10*3/uL (ref 4.0–10.5)
nRBC: 0 % (ref 0.0–0.2)

## 2020-05-12 LAB — BASIC METABOLIC PANEL
Anion gap: 8 (ref 5–15)
BUN: 6 mg/dL (ref 6–20)
CO2: 25 mmol/L (ref 22–32)
Calcium: 9.3 mg/dL (ref 8.9–10.3)
Chloride: 104 mmol/L (ref 98–111)
Creatinine, Ser: 0.74 mg/dL (ref 0.61–1.24)
GFR, Estimated: >60 mL/min (ref >60)
Glucose, Bld: 99 mg/dL (ref 70–99)
Potassium: 4 mmol/L (ref 3.5–5.1)
Sodium: 137 mmol/L (ref 135–145)

## 2020-05-12 LAB — PROTIME-INR
INR: 1.7 — ABNORMAL HIGH (ref 0.8–1.2)
Prothrombin Time: 19.2 seconds — ABNORMAL HIGH (ref 11.4–15.2)

## 2020-05-12 LAB — LACTATE DEHYDROGENASE: LDH: 198 U/L — ABNORMAL HIGH (ref 98–192)

## 2020-05-12 LAB — HEPARIN LEVEL (UNFRACTIONATED): Heparin Unfractionated: 0.28 IU/mL — ABNORMAL LOW (ref 0.30–0.70)

## 2020-05-12 LAB — MAGNESIUM: Magnesium: 2 mg/dL (ref 1.7–2.4)

## 2020-05-12 MED ORDER — CARVEDILOL 6.25 MG PO TABS
6.2500 mg | ORAL_TABLET | Freq: Two times a day (BID) | ORAL | Status: DC
  Administered 2020-05-12 – 2020-05-18 (×12): 6.25 mg via ORAL
  Filled 2020-05-12 (×13): qty 1

## 2020-05-12 MED ORDER — WARFARIN SODIUM 5 MG PO TABS
5.0000 mg | ORAL_TABLET | Freq: Once | ORAL | Status: AC
  Administered 2020-05-12: 5 mg via ORAL
  Filled 2020-05-12: qty 1

## 2020-05-12 MED ORDER — HEPARIN (PORCINE) 25000 UT/250ML-% IV SOLN
900.0000 [IU]/h | INTRAVENOUS | Status: DC
  Administered 2020-05-12 – 2020-05-15 (×3): 900 [IU]/h via INTRAVENOUS
  Filled 2020-05-12 (×3): qty 250

## 2020-05-12 MED ORDER — CEFAZOLIN SODIUM-DEXTROSE 2-4 GM/100ML-% IV SOLN
2.0000 g | Freq: Three times a day (TID) | INTRAVENOUS | Status: DC
  Administered 2020-05-12 – 2020-05-25 (×41): 2 g via INTRAVENOUS
  Filled 2020-05-12 (×42): qty 100

## 2020-05-12 NOTE — Plan of Care (Signed)
  Problem: Education: Goal: Knowledge of General Education information will improve Description: Including pain rating scale, medication(s)/side effects and non-pharmacologic comfort measures Outcome: Progressing   Problem: Health Behavior/Discharge Planning: Goal: Ability to manage health-related needs will improve Outcome: Progressing   Problem: Clinical Measurements: Goal: Ability to maintain clinical measurements within normal limits will improve Outcome: Progressing   Problem: Clinical Measurements: Goal: Diagnostic test results will improve Outcome: Progressing   Problem: Clinical Measurements: Goal: Cardiovascular complication will be avoided Outcome: Progressing   Problem: Nutrition: Goal: Adequate nutrition will be maintained Outcome: Progressing   Problem: Pain Managment: Goal: General experience of comfort will improve Outcome: Progressing   Problem: Skin Integrity: Goal: Risk for impaired skin integrity will decrease Outcome: Progressing   Problem: Education: Goal: Patient will understand all VAD equipment and how it functions Outcome: Progressing   Problem: Education: Goal: Patient will be able to verbalize current INR target range and antiplatelet therapy for discharge home Outcome: Progressing   Problem: Cardiac: Goal: LVAD will function as expected and patient will experience no clinical alarms Outcome: Progressing

## 2020-05-12 NOTE — Progress Notes (Signed)
[facility]  Date of Admission:  04/28/2020     Total days of antibiotics 14         ASSESSMENT:  Jon Martin has worsening MSSA abscess on clinical exam and on CT at his LVAD drive line exit site and will likely need to have debridement with CVTS evaluation planned on Monday. Will start Cefazolin as he is nearing the tail end of the treatment range with previously administered oritivancin. Duration end date to be determined by surgical intervention and once source control has been achieved. Continue with wound are per LVAD team.   PLAN:  1. Start Cefazolin for MSSA infection 2. Await CVTS evaluation for surgical intervention 3. Continue wound care and LVAD management per primary team.  Principal Problem:   Infection associated with driveline of left ventricular assist device (LVAD) (HCC) Active Problems:   Chronic systolic heart failure (HCC)   Left ventricular assist device (LVAD) complication   Complication involving left ventricular assist device (LVAD)   Dental caries   Retained dental root   Chronic periodontitis   Loss of teeth due to extraction   . carvedilol  6.25 mg Oral BID WC  . losartan  25 mg Oral Daily  . pantoprazole  40 mg Oral Daily  . sodium chloride flush  3 mL Intravenous Q12H  . warfarin  5 mg Oral ONCE-1600  . Warfarin - Pharmacist Dosing Inpatient   Does not apply q1600    SUBJECTIVE:  Jon Martin was last seen by the ID team on 4/1 for MSSA infection completing Ancef with planned discharge and receiving a dose of oritivancin. In the interim he has had worsening abscess over the past couple of days with tunneling reported by LVAD coordinator. CT with focal gas and fluid collection approximately 2.0 x 2.7 x 1.1 cm consistent with cellulitis and subcutaneous abscess. ID has been asked for antibiotic recommendations. Has remained afebrile since admission.   No Known Allergies   Review of Systems: Review of Systems   Constitutional: Negative for chills, fever and weight loss.  Respiratory: Negative for cough, shortness of breath and wheezing.   Cardiovascular: Negative for chest pain and leg swelling.  Gastrointestinal: Negative for abdominal pain, constipation, diarrhea, nausea and vomiting.  Skin: Negative for rash.       Abscess and drainage around LVAD drive line exit site.      OBJECTIVE: Vitals:   05/11/20 2300 05/12/20 0300 05/12/20 0310 05/12/20 0800  BP: 94/66  92/73 (!) 110/93  Pulse:   72 92  Resp:  20 17 17   Temp:   98.4 F (36.9 C) 98.2 F (36.8 C)  TempSrc:   Oral Oral  SpO2:   99% 100%  Weight:   55.5 kg   Height:       Body mass index is 20.36 kg/m.  Physical Exam Constitutional:      General: He is not in acute distress.    Appearance: He is well-developed.  Cardiovascular:     Rate and Rhythm: Normal rate and regular rhythm.     Comments: LVAD hum Pulmonary:     Effort: Pulmonary effort is normal.     Breath sounds: Normal breath sounds.  Skin:    General: Skin is warm and dry.  Neurological:     Mental Status: He is alert.  Psychiatric:        Mood and Affect: Mood normal.     Lab Results Lab Results  Component Value Date  WBC 8.2 05/12/2020   HGB 8.9 (L) 05/12/2020   HCT 28.5 (L) 05/12/2020   MCV 96.0 05/12/2020   PLT 343 05/12/2020    Lab Results  Component Value Date   CREATININE 0.74 05/12/2020   BUN 6 05/12/2020   NA 137 05/12/2020   K 4.0 05/12/2020   CL 104 05/12/2020   CO2 25 05/12/2020    Lab Results  Component Value Date   ALT 32 03/29/2020   AST 107 (H) 03/29/2020   ALKPHOS 76 03/29/2020   BILITOT 0.5 03/29/2020     Microbiology: Recent Results (from the past 240 hour(s))  Culture, blood (Routine X 2) w Reflex to ID Panel     Status: None   Collection Time: 05/04/20 12:26 PM   Specimen: BLOOD  Result Value Ref Range Status   Specimen Description BLOOD BLOOD LEFT HAND  Final   Special Requests   Final    BOTTLES DRAWN  AEROBIC AND ANAEROBIC Blood Culture adequate volume   Culture   Final    NO GROWTH 5 DAYS Performed at [facility], [address], [place], [place] [address]    Report Status 05/09/2020 FINAL  Final  Culture, blood (Routine X 2) w Reflex to ID Panel     Status: None   Collection Time: 05/04/20 12:27 PM   Specimen: BLOOD  Result Value Ref Range Status   Specimen Description BLOOD BLOOD RIGHT HAND  Final   Special Requests   Final    BOTTLES DRAWN AEROBIC AND ANAEROBIC Blood Culture results may not be optimal due to an inadequate volume of blood received in culture bottles   Culture   Final    NO GROWTH 5 DAYS Performed at [facility], [address], [place], [place] [address]    Report Status 05/09/2020 FINAL  Final  Aerobic Culture w Gram Stain (superficial specimen)     Status: None   Collection Time: 05/09/20  9:47 AM   Specimen: Abscess; Wound  Result Value Ref Range Status   Specimen Description ABSCESS  Final   Special Requests NONE  Final   Gram Stain   Final    RARE WBC PRESENT,BOTH PMN AND MONONUCLEAR RARE GRAM POSITIVE COCCI IN PAIRS IN CHAINS Performed at [facility], [address] [place], [place] [address]    Culture RARE STAPHYLOCOCCUS AUREUS  Final   Report Status 05/11/2020 FINAL  Final   Organism ID, Bacteria STAPHYLOCOCCUS AUREUS  Final      Susceptibility   Staphylococcus aureus - MIC*    CIPROFLOXACIN 1 SENSITIVE Sensitive     ERYTHROMYCIN >=8 RESISTANT Resistant     GENTAMICIN <=0.5 SENSITIVE Sensitive     OXACILLIN <=0.25 SENSITIVE Sensitive     TETRACYCLINE <=1 SENSITIVE Sensitive     VANCOMYCIN <=0.5 SENSITIVE Sensitive     TRIMETH/SULFA <=10 SENSITIVE Sensitive     CLINDAMYCIN RESISTANT Resistant     RIFAMPIN <=0.5 SENSITIVE Sensitive     Inducible Clindamycin POSITIVE Resistant     * RARE STAPHYLOCOCCUS AUREUS     Marcos Eke, NP [facility]  05/12/2020  10:48  AM

## 2020-05-12 NOTE — Progress Notes (Addendum)
ANTICOAGULATION CONSULT NOTE  Pharmacy Consult for warfarin + heparin Indication: LVAD  No Known Allergies  Patient Measurements: Height: 5\' 5"  (165.1 cm) Weight: 55.5 kg (122 lb 5.7 oz) IBW/kg (Calculated) : 61.5  Heparin dosing weight: 55.2 kg  Vital Signs: Temp: 98.2 F (36.8 C) (04/08 0800) Temp Source: Oral (04/08 0800) BP: 110/93 (04/08 0800) Pulse Rate: 92 (04/08 0800)  Labs: Recent Labs    05/10/20 0045 05/10/20 0831 05/11/20 0115 05/12/20 0030  HGB 9.6*  --  9.3* 8.9*  HCT 30.9*  --  29.7* 28.5*  PLT 377  --  355 343  LABPROT 15.4*  --  17.7* 19.2*  INR 1.3*  --  1.5* 1.7*  HEPARINUNFRC 0.37  --  0.41 0.28*  CREATININE  --  0.84 0.75 0.74    Estimated Creatinine Clearance: 102.1 mL/min (by C-G formula based on SCr of 0.74 mg/dL).   Medical History: Past Medical History:  Diagnosis Date  . Dilated cardiomyopathy (HCC)   . Dilated cardiomyopathy (HCC) 02/2019  . Polysubstance abuse (HCC)     Assessment: 24 yom presenting with concern for driveline infection - hx HM3 implanted on 02/26/19 - on warfarin PTA. PTA regimen is 2.5 mg daily. He is now s/p dental extractions on 3/31.  INR up to 1.7 today starting to bump with much higher doses than PTA. No overt bleeding noted. Eating well. Hgb has stabilized 9-10s, pltc wnl. LDH ~ 200, stable. New abscess on abdomen concern for need of debridement  - will not push warfarin dose and keep INR < 2 over weekend Heparin level 0.3 on heparin drip 900uts/hr  No bleeding or IV issues noted.   Goal of Therapy:  Heparin level ~0.3 INR 2-2.5 Monitor platelets by anticoagulation protocol: Yes   Plan:  Continue Heparin drip 900 uts/hr  Warfarin 5mg  today    DC plan  is back to PTA regimen warfarin 2.5mg  daily - as patient has been compliant and  therapeutic on this dose PTA as he dose not eat well at home Daily heparin level, CBC, and INR.   4/31 Pharm.D. CPP, BCPS Clinical  Pharmacist [phone] 05/12/2020 8:37 AM      [facility] pharmacy phone numbers are listed on [email or web address]

## 2020-05-12 NOTE — Progress Notes (Addendum)
Patient ID: Jon Martin, male   DOB: 03/07/1985, 35 y.o.   MRN: [id number]   Advanced Heart Failure VAD Team Note  PCP-Cardiologist: No primary care provider on file.   Subjective:     HM3 LVAD admitted w/ Drive-line infection.  - Wound culture with S aureus. Blood cx NGTD - 3/26 abx narrowed to cefazolin 2gm IV Q 8hr. ID following  - 3/28 CT abd/pelvis soft tissue no deep infection. Oral Surgeon consulted.  - 3/31 s/p extractions of teeth #5 and 15  - 3/31 Low flow alarms last night, got 1 L IVF and low flows have stopped - 4/5 small pustule on abdomen noted>> performed superficial I&D at bedside, no communication with DL tunnel, cultures sent and pending   Remains on heparin drip + coumadin.  INR 1.1>>1.3 >>1.5 >>1.7 today.   Called by VAD coordinator for worsening abscess at driveline.   Complaining of abdominal pain at driveline. Denies SOB.   LVAD INTERROGATION:   HeartMate 3 LVAD:   Flow 3.7 iters/min, speed 5400, power 4  PI 4.7  PI events   VAD interrogated personally. Parameters stable.  Objective:    Vital Signs:   Temp:  [98.1 F (36.7 C)-98.5 F (36.9 C)] 98.2 F (36.8 C) (04/08 0800) Pulse Rate:  [72-99] 92 (04/08 0800) Resp:  [16-20] 17 (04/08 0800) BP: (76-110)/(62-93) 110/93 (04/08 0800) SpO2:  [97 %-100 %] 100 % (04/08 0800) Weight:  [55.5 kg] 55.5 kg (04/08 0310) Last BM Date: 05/03/20   Mean arterial Pressure 70-80s   Intake/Output:   Intake/Output Summary (Last 24 hours) at 05/12/2020 1016 Last data filed at 05/12/2020 0800 Gross per 24 hour  Intake 720.3 ml  Output 1025 ml  Net -304.7 ml     Physical Exam    Physical Exam: GENERAL: No acute distress. HEENT: normal  NECK: Supple, JVP flat .  2+ bilaterally, no bruits.  No lymphadenopathy or thyromegaly appreciated.   CARDIAC:  Mechanical heart sounds with LVAD hum present.  LUNGS:  Clear to auscultation bilaterally.  ABDOMEN:  Soft, round, nontender, positive bowel sounds x4.     LVAD  exit site: with rash around driveline. Indurated along drive with open area the medial aspect. No purulent exudate. Bloody drainage noted.   EXTREMITIES:  Warm and dry, no cyanosis, clubbing, rash or edema  NEUROLOGIC:  Alert and oriented x 3.    No aphasia.  No dysarthria.  Affect pleasant.      Telemetry   SR-ST 90-130s when anxious  Labs   Basic Metabolic Panel: Recent Labs  Lab 05/08/20 0351 05/09/20 0041 05/10/20 0045 05/10/20 0831 05/11/20 0115 05/12/20 0030  NA 137 135  --  136 136 137  K 4.0 3.9  --  3.4* 3.6 4.0  CL 104 101  --  102 104 104  CO2 24 27  --  25 24 25   GLUCOSE 125* 101*  --  104* 112* 99  BUN <5* 5*  --  7 8 6   CREATININE 0.81 0.73  --  0.84 0.75 0.74  CALCIUM 9.3 9.4  --  9.4 9.6 9.3  MG 1.8 2.0 1.8  --  1.8 2.0    Liver Function Tests: No results for input(s): AST, ALT, ALKPHOS, BILITOT, PROT, ALBUMIN in the last 168 hours. No results for input(s): LIPASE, AMYLASE in the last 168 hours. No results for input(s): AMMONIA in the last 168 hours.  CBC: Recent Labs  Lab 05/08/20 0351 05/09/20 0041 05/10/20 0045 05/11/20 0115 05/12/20  0030  WBC 7.9 8.7 7.8 7.9 8.2  HGB 8.9* 9.3* 9.6* 9.3* 8.9*  HCT 28.5* 29.5* 30.9* 29.7* 28.5*  MCV 98.3 95.8 97.5 97.7 96.0  PLT 367 371 377 355 343    INR: Recent Labs  Lab 05/08/20 0351 05/09/20 0041 05/10/20 0045 05/11/20 0115 05/12/20 0030  INR 1.1 1.1 1.3* 1.5* 1.7*    Other results:    Imaging   No results found.   Medications:     Scheduled Medications: . carvedilol  3.125 mg Oral BID WC  . losartan  25 mg Oral Daily  . pantoprazole  40 mg Oral Daily  . sodium chloride flush  3 mL Intravenous Q12H  . warfarin  5 mg Oral ONCE-1600  . Warfarin - Pharmacist Dosing Inpatient   Does not apply q1600    Infusions: . sodium chloride    .  ceFAZolin (ANCEF) IV    . heparin    . lactated ringers 10 mL/hr at 05/04/20 0921  . lactated ringers 10 mL/hr at 05/04/20 1136    PRN  Medications: sodium chloride, acetaminophen, ondansetron (ZOFRAN) IV   Patient Profile   35 y/o male w/ chronic systolic HF s/p HM3 LVAD admitted w/ staph aureus drive-line infection   Assessment/Plan:    1. 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 -  Admitted for IV abx vancomycin/cefepime/Flagyl - Driveline culture with S aureus. Blood cx NGTD  - 3/27 abx narrowed to cefazolin 2gm IV Q 8hr => stopped 3/31 and got ortivancin 4/1  - CT abd/pelvis- soft tissue infection. No deep infection - 4/5 small pustule on abdomen noted >> performed superficial I&D at bedside, no communication with DL tunnel, cultures sent and pending. Culture results pending.  -4/8 now open area at medial aspect communicates with driveline. Increased induration noted. This area was packed witth 1/2 inch packing strip.  - Discussed with ID . Starting ancef today.  - Sending for CT today.   2.  Chronic Systolic HF due to NICM - Echo EF 10% with biventricular failure - HM-3 VAD implant 02/26/19 - Echo 7/21 EF 40-45% - No bb or ARB with previous dehydration/RV failure.  - VAD interrogated personally. Parameters stable. - MAPs 80-100s - Increase carvedilol to 6.25 mg twice a day.   - Continue losartan - INR 1.7  LDH 198 Continue  IV heparin + warfarin until INR >= 1.7.  Discussed dosing with PharmD personally.  3. Anemia - Baseline hgb has been upper 9's, admission hgb 7.7 => 6.1.  However, hgb up to 10.5 after 2 units PRCs and stool was heme negative.  Does not appear to be actively GI bleeding, ?initial drop dilutional with IVF at admission.   - Hgb 8.9 today with some bleeding at driveline.    4. F/E/N - We continue to worry about his nutritional status after his mother died. - Appetite has been great in the hospital.  5. Broken Teeth - 3/31 s/p extractions of teeth #5 and 15  - Appreciate dental surgery  - site looks good  6. Hypokalemia/ Hypomagnesemia   - Mg 2. He has received IV Mag supplements. Mag )  - K 4  I reviewed the LVAD parameters from today, and compared the results to the patient's prior recorded data.  No programming changes were made.  The LVAD is functioning within specified parameters.  The patient performs LVAD self-test daily.  LVAD interrogation was negative for any significant power changes, alarms  or PI events/speed drops.  LVAD equipment check completed and is in good working order.  Back-up equipment present.   LVAD education done on emergency procedures and precautions and reviewed exit site care.  Length of Stay: 14  [name], NP 05/12/2020, 10:16 AM  VAD Team --- VAD ISSUES ONLY--- Pager [phone] (7am - 7am)  Advanced Heart Failure Team  Pager [phone] (M-F; 7a - 5p)  Please contact [facility] for night-coverage after hours (5p -7a ) and weekends on [email or web address]  Patient seen and examined with the above-signed Advanced Practice Provider and/or Housestaff. I personally reviewed laboratory data, imaging studies and relevant notes. I independently examined the patient and formulated the important aspects of the plan. I have edited the note to reflect any of my changes or salient points. I have personally discussed the plan with the patient and/or family.  Developed fluctuant area along driveline course at site of previous pustule. CT shows subcutaneous abscess. This area was opened up at bedside by tunneling a Q-tip through the site into the driveline. Tract. The tract was then packed.   Remains on heparin/warfarin.. No bleeding  General:  NAD.  HEENT: normal  Neck: supple. JVP not elevated.  Carotids 2+ bilat; no bruits. No lymphadenopathy or thryomegaly appreciated. Cor: LVAD hum.  Lungs: Clear. Abdomen: osoft, nontender, non-distended. No hepatosplenomegaly. No bruits or masses. Good bowel sounds. Driveline site with large superficial midline abscess. Anchor in place.  Extremities: no cyanosis, clubbing,  rash. Warm no edema  Neuro: alert & oriented x 3. No focal deficits. Moves all 4 without problem   He has developed a large abscess along DL course. Site opened up as above and packed. D/w ID and abx expanded. Will hold warfarin. Will review with Dr. Laneta Simmers on Monday to see if further debridement needed.  VAD interrogated personally. Parameters stable.  Arvilla Meres, MD  6:58 PM

## 2020-05-12 NOTE — Progress Notes (Signed)
Pharmacy Antibiotic Note  Jon Martin is a 35 y.o. male with Hx HF and LVAD HM3 placed 1/21 admitted on 04/28/2020 with  Drive line infection with staph aureus.  Per ID patient received a dose of oritavancin 4/2. 4/5 boil noted on patients abdomen and was lanced> grew staph aureus  Discussed with ID - with long half life of oritavancin no need for additional antibiotics to caver small abscess.  WBC wnl, afebrile, Cr 0.7   4/8 abdominal abscess larger and continues to drain  Discussed with ID today > will begin ancef for MSSA coverage Cr 0.7 wbc wnl  Plan: Ancef 2gm IV q8h  Continue wound care and follow    Height: 5\' 5"  (165.1 cm) Weight: 55.5 kg (122 lb 5.7 oz) IBW/kg (Calculated) : 61.5  Temp (24hrs), Avg:98.3 F (36.8 C), Min:98.1 F (36.7 C), Max:98.5 F (36.9 C)  Recent Labs  Lab 05/08/20 0351 05/09/20 0041 05/10/20 0045 05/10/20 0831 05/11/20 0115 05/12/20 0030  WBC 7.9 8.7 7.8  --  7.9 8.2  CREATININE 0.81 0.73  --  0.84 0.75 0.74    Estimated Creatinine Clearance: 102.1 mL/min (by C-G formula based on SCr of 0.74 mg/dL).    No Known Allergies  Antimicrobials this admission:   Dose adjustments this admission: Vanc 3/25 > 3/27 Cefepime 3/25 > 3/27 Flagyl 3/25>3/27 Ancef 3/27>4/1   Resume 4/8>  Oritavancin 4/2 x1  Microbiology results:  4/5 abdominal abscess > MSSA 3/25 BCx neg 3/25 DL > MSSA (R: clinda, erythro)    4/25 Pharm.D. CPP, BCPS Clinical Pharmacist [phone] 05/12/2020 9:37 AM

## 2020-05-12 NOTE — Progress Notes (Addendum)
LVAD Coordinator Rounding Note:  Admitted 04/28/20 due to Dr. Clayborne Dana service for VAD drive line infection.   HM III LVAD implanted on 02/26/19 by Dr. Darcey Nora under Destination Therapy criteria.  Patient awake, alert, pleasant this am. He denies complaints, says he feels good today.  I had a long discussion with the pt yesterday about committing to seeing [place] weekly in clinic to manage his driveline for a while. Pt has severe skin excoriations that I think may be contributing to his driveline infections and may have been the cause for the abscess. Pt states that he will commit to seeing [place] weekly.  On exam today the abscess near the pts driveline is enlarged-see wound notes below.   Discussed pt with ID team today who will manage IV antibiotics - they are recommending surgical intervention for this pt.   CT ABD/PELVIS done today: Skin thickening and subcutaneous infiltration in LEFT upper quadrant a adjacent to the inferior margin of the LVAD lead, with a focal gas and fluid collection approximately 2.0 x 2.7 x 1.1 cm most consistent with cellulitis and subcutaneous abscess.  Vital signs: Temp: 98.1 HR: 94 Doppler Pressure: 78 Automatic BP:  106/78 (87) O2 Sat: 99% RA  Wt: 122.8>123>124.1>120.8>121.4>121.6>121.9>123.2>121.9>120.8>122 lbs   LVAD interrogation reveals:  Speed: 5400 Flow: 3.7 Power: 4.0w PI: 5.1 Hct: 20 - do not change  Alarms: none Events: 10+ today; 90+ yesterday  Fixed speed: 5400 Low speed limit: 5100  Drive Line: Existing VAD dressing removed and site care performed using sterile technique. Drive line exit site cleaned with Chlora prep applicators x 2, RINSED WITH SALINE, allowed to dry, and gauze dressing with silver strip re-applied. Exit site tender and unincorporated, the velour is fully implanted at exit site. Site tunnels approx 5 cm and appears to be communicating with the abdominal abscess. Slight redness and tenderness, no foul odor. Pt has a rash  in the surrounding area of the driveline and the skin is excoriated. Drive line anchor re-applied. Pt is requiring daily dressing changes using the silver strip by bedside nurse.  Next dressing change due 05/13/20.       Abdominal Abcsess: 2 x 2 removed. Expressed moderate amount of bloody drainage that exits through the driveline site. Darrick Grinder, NP into the room to open the area. Site was numbed with lidocaine, opened slightly with tweezers and allowed to drain. Packed with quarter inch iodoform and covered with daily driveline dressing kit.     I notified CT surgery team of possible need for debridement.   Labs:  LDH trend: 181>171>185>196>193>295>233>213>222>197>198  INR trend: 2.3>1.7>1.2>1.1>1.1>1.0>1.0>1.1>1.5>1.7  Anticoagulation Plan: -INR Goal: 2.0 - 2.5 -ASA Dose: none  Blood Products:  - 04/29/20>>2 units PCs  Device: N/A  Infection:  - 04/28/20 BCs>> No growth final - 04/28/20 DL culture>>rare staph aureus; final  - 05/04/20 BC>> No growth, final - 05/09/20 Abdominal abscess culture>> rare Staph aureus  Drips:  Heparin @ 900 units/hr  Plan/Recommendations:  1. Call VAD Coordinator if any VAD equipment or drive line issues. 2. Daily dressing changes on driveline. Please pack abscess with iodoform.   Tanda Rockers RN [place] Coordinator  Office: [phone]  24/7 Pager: [phone]

## 2020-05-13 DIAGNOSIS — Z95811 Presence of heart assist device: Secondary | ICD-10-CM | POA: Diagnosis not present

## 2020-05-13 DIAGNOSIS — T827XXA Infection and inflammatory reaction due to other cardiac and vascular devices, implants and grafts, initial encounter: Secondary | ICD-10-CM | POA: Diagnosis not present

## 2020-05-13 LAB — BASIC METABOLIC PANEL
Anion gap: 8 (ref 5–15)
BUN: 6 mg/dL (ref 6–20)
CO2: 26 mmol/L (ref 22–32)
Calcium: 9.1 mg/dL (ref 8.9–10.3)
Chloride: 103 mmol/L (ref 98–111)
Creatinine, Ser: 0.78 mg/dL (ref 0.61–1.24)
GFR, Estimated: >60 mL/min (ref >60)
Glucose, Bld: 96 mg/dL (ref 70–99)
Potassium: 3.6 mmol/L (ref 3.5–5.1)
Sodium: 137 mmol/L (ref 135–145)

## 2020-05-13 LAB — CBC
HCT: 27.7 % — ABNORMAL LOW (ref 39.0–52.0)
Hemoglobin: 9.2 g/dL — ABNORMAL LOW (ref 13.0–17.0)
MCH: 32.2 pg (ref 26.0–34.0)
MCHC: 33.2 g/dL (ref 30.0–36.0)
MCV: 96.9 fL (ref 80.0–100.0)
Platelets: 336 10*3/uL (ref 150–400)
RBC: 2.86 MIL/uL — ABNORMAL LOW (ref 4.22–5.81)
RDW: 20.8 % — ABNORMAL HIGH (ref 11.5–15.5)
WBC: 8.3 10*3/uL (ref 4.0–10.5)
nRBC: 0 % (ref 0.0–0.2)

## 2020-05-13 LAB — PROTIME-INR
INR: 1.7 — ABNORMAL HIGH (ref 0.8–1.2)
Prothrombin Time: 19.6 seconds — ABNORMAL HIGH (ref 11.4–15.2)

## 2020-05-13 LAB — LACTATE DEHYDROGENASE: LDH: 206 U/L — ABNORMAL HIGH (ref 98–192)

## 2020-05-13 LAB — MAGNESIUM: Magnesium: 1.7 mg/dL (ref 1.7–2.4)

## 2020-05-13 LAB — HEPARIN LEVEL (UNFRACTIONATED): Heparin Unfractionated: 0.2 IU/mL — ABNORMAL LOW (ref 0.30–0.70)

## 2020-05-13 MED ORDER — "THROMBI-PAD 3""X3"" EX PADS"
1.0000 | MEDICATED_PAD | Freq: Once | CUTANEOUS | Status: DC
  Filled 2020-05-13: qty 1

## 2020-05-13 MED ORDER — MAGNESIUM SULFATE 2 GM/50ML IV SOLN
2.0000 g | Freq: Once | INTRAVENOUS | Status: AC
  Administered 2020-05-13: 2 g via INTRAVENOUS
  Filled 2020-05-13: qty 50

## 2020-05-13 NOTE — Progress Notes (Signed)
ANTICOAGULATION CONSULT NOTE  Pharmacy Consult for heparin Indication: LVAD  No Known Allergies  Patient Measurements: Height: 5\' 5"  (165.1 cm) Weight: 55.3 kg (121 lb 14.6 oz) IBW/kg (Calculated) : 61.5  Heparin dosing weight: 55.2 kg  Vital Signs: Temp: 98.1 F (36.7 C) (04/09 1155) Temp Source: Oral (04/09 1155) BP: 87/53 (04/09 1154) Pulse Rate: 91 (04/09 1154)  Labs: Recent Labs    05/11/20 0115 05/12/20 0030 05/13/20 0051  HGB 9.3* 8.9* 9.2*  HCT 29.7* 28.5* 27.7*  PLT 355 343 336  LABPROT 17.7* 19.2* 19.6*  INR 1.5* 1.7* 1.7*  HEPARINUNFRC 0.41 0.28* 0.20*  CREATININE 0.75 0.74 0.78    Estimated Creatinine Clearance: 101.8 mL/min (by C-G formula based on SCr of 0.78 mg/dL).   Medical History: Past Medical History:  Diagnosis Date  . Dilated cardiomyopathy (HCC)   . Dilated cardiomyopathy (HCC) 02/2019  . Polysubstance abuse (HCC)     Assessment: 43 yom presenting with concern for driveline infection - hx HM3 implanted on 02/26/19 - on warfarin PTA. PTA regimen is 2.5 mg daily. He is now s/p dental extractions on 3/31.  INR up to 1.7 today.  Bleeding from site where abscess was lanced yesterday.  Heparin has been on hold since ~ 1030 this AM.  CBC stable.  Spoke to RN ~ 1530 PM.  Bleeding has stopped, resuming heparin now.  Goal of Therapy:  Heparin level ~0.3 INR 2-2.5 Monitor platelets by anticoagulation protocol: Yes   Plan:  Resume IV heparin at 900 units/hr.  Heparin to be on Monday AM for OR. Watch bleeding site carefully. AM CBC and heparin level.  Saturday, Reece Leader, BCCP Clinical Pharmacist  05/13/2020 2:55 PM   [facility] pharmacy phone numbers are listed on [email or web address]

## 2020-05-13 NOTE — Progress Notes (Signed)
Drive-line dressing changed due to bleeding/shadowing from abscess site. Dr. Shirlee Latch made aware. Heparin paused as per verbal order from dr Shirlee Latch.

## 2020-05-13 NOTE — Progress Notes (Signed)
Patient ID: Jon Martin, male   DOB: 1985/06/12, 35 y.o.   MRN: [id number]   Advanced Heart Failure VAD Team Note  PCP-Cardiologist: No primary care provider on file.   Subjective:     HM3 LVAD admitted w/ Drive-line infection.  - Wound culture with S aureus. Blood cx NGTD - 3/26 abx narrowed to cefazolin 2gm IV Q 8hr. ID following  - 3/28 CT abd/pelvis soft tissue no deep infection. Oral Surgeon consulted.  - 3/31 s/p extractions of teeth #5 and 15  - 3/31 Low flow alarms last night, got 1 L IVF and low flows have stopped - 4/5 small pustule on abdomen noted>> performed superficial I&D at bedside, no communication with DL tunnel, cultures sent and pending   Remains on heparin drip.  INR 1.1>>1.3 >>1.5 >>1.7>>1.7 today.   Abscess at driveline site opened yesterday. CT abdomen done, abscess adjacent to driveline noted.   No dyspnea.   LVAD INTERROGATION:   HeartMate 3 LVAD:   Flow 4.2 liters/min, speed 5400, power 3.9  PI 3.2  Multiple PI events   VAD interrogated personally. Parameters stable.  Objective:    Vital Signs:   Temp:  [97.8 F (36.6 C)-98.2 F (36.8 C)] 98.1 F (36.7 C) (04/09 0300) Pulse Rate:  [90-95] 95 (04/09 0616) Resp:  [15-19] 15 (04/09 0300) BP: (91-111)/(63-83) 102/78 (04/09 0616) SpO2:  [96 %-100 %] 100 % (04/09 0300) Weight:  [55.3 kg] 55.3 kg (04/09 0300) Last BM Date: 05/03/20   Mean arterial Pressure 80s  Intake/Output:   Intake/Output Summary (Last 24 hours) at 05/13/2020 1004 Last data filed at 05/13/2020 0300 Gross per 24 hour  Intake 1293.17 ml  Output 2025 ml  Net -731.83 ml     Physical Exam   General: Well appearing this am. NAD.  HEENT: Normal. Neck: Supple, JVP 7-8 cm. Carotids OK.  Cardiac:  Mechanical heart sounds with LVAD hum present.  Lungs:  CTAB, normal effort.  Abdomen:  NT, ND, no HSM. No bruits or masses. +BS  LVAD exit site: Fresh dressing Extremities:  Warm and dry. No cyanosis, clubbing, rash, or edema.  Neuro:   Alert & oriented x 3. Cranial nerves grossly intact. Moves all 4 extremities w/o difficulty. Affect pleasant      Telemetry   ST around 110, personally reviewed   Labs   Basic Metabolic Panel: Recent Labs  Lab 05/09/20 0041 05/10/20 0045 05/10/20 0831 05/11/20 0115 05/12/20 0030 05/13/20 0051  NA 135  --  136 136 137 137  K 3.9  --  3.4* 3.6 4.0 3.6  CL 101  --  102 104 104 103  CO2 27  --  25 24 25 26   GLUCOSE 101*  --  104* 112* 99 96  BUN 5*  --  7 8 6 6   CREATININE 0.73  --  0.84 0.75 0.74 0.78  CALCIUM 9.4  --  9.4 9.6 9.3 9.1  MG 2.0 1.8  --  1.8 2.0 1.7    Liver Function Tests: No results for input(s): AST, ALT, ALKPHOS, BILITOT, PROT, ALBUMIN in the last 168 hours. No results for input(s): LIPASE, AMYLASE in the last 168 hours. No results for input(s): AMMONIA in the last 168 hours.  CBC: Recent Labs  Lab 05/09/20 0041 05/10/20 0045 05/11/20 0115 05/12/20 0030 05/13/20 0051  WBC 8.7 7.8 7.9 8.2 8.3  HGB 9.3* 9.6* 9.3* 8.9* 9.2*  HCT 29.5* 30.9* 29.7* 28.5* 27.7*  MCV 95.8 97.5 97.7 96.0 96.9  PLT 371  377 355 343 336    INR: Recent Labs  Lab 05/09/20 0041 05/10/20 0045 05/11/20 0115 05/12/20 0030 05/13/20 0051  INR 1.1 1.3* 1.5* 1.7* 1.7*    Other results:    Imaging   CT ABDOMEN PELVIS WO CONTRAST  Result Date: 05/12/2020 CLINICAL DATA:  LVAD driveline infection EXAM: CT ABDOMEN AND PELVIS WITHOUT CONTRAST TECHNIQUE: Multidetector CT imaging of the abdomen and pelvis was performed following the standard protocol without IV contrast. Sagittal and coronal MPR images reconstructed from axial data set. COMPARISON:  05/01/2020 FINDINGS: Lower chest: Lung bases clear.  LVAD at LV apex. Hepatobiliary: Liver and gallbladder normal appearance. Pancreas: Normal appearance Spleen: Normal appearance Adrenals/Urinary Tract: Adrenal glands, kidneys, ureters, and bladder normal appearance Stomach/Bowel: Normal appendix. Stomach and bowel loops normal  appearance Vascular/Lymphatic: Aorta normal caliber.  No adenopathy. Reproductive: Prostate gland and seminal vesicles normal appearance Other: No free air or free fluid. Tiny umbilical hernia containing fat. Skin thickening with subcutaneous infiltration identified in LEFT upper quadrant adjacent to the inferior margin of the LVAD lead, with a focal gas and fluid collection approximately 2.0 x 2.7 x 1.1 cm concerning for cellulitis and abscess. Musculoskeletal: Unremarkable IMPRESSION: Skin thickening and subcutaneous infiltration in LEFT upper quadrant a adjacent to the inferior margin of the LVAD lead, with a focal gas and fluid collection approximately 2.0 x 2.7 x 1.1 cm most consistent with cellulitis and subcutaneous abscess. Tiny umbilical hernia containing fat. No acute intra-abdominal or intrapelvic abnormalities. Electronically Signed   By: Ulyses Southward M.D.   On: 05/12/2020 10:43     Medications:     Scheduled Medications: . carvedilol  6.25 mg Oral BID WC  . losartan  25 mg Oral Daily  . pantoprazole  40 mg Oral Daily  . sodium chloride flush  3 mL Intravenous Q12H  . Warfarin - Pharmacist Dosing Inpatient   Does not apply q1600    Infusions: . sodium chloride    .  ceFAZolin (ANCEF) IV 2 g (05/13/20 0618)  . heparin 900 Units/hr (05/12/20 1925)  . lactated ringers 10 mL/hr at 05/04/20 0921  . lactated ringers 10 mL/hr at 05/04/20 1136    PRN Medications: sodium chloride, acetaminophen, ondansetron (ZOFRAN) IV   Patient Profile   35 y/o male w/ chronic systolic HF s/p HM3 LVAD admitted w/ staph aureus drive-line infection   Assessment/Plan:    1. 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 -  Admitted for IV abx vancomycin/cefepime/Flagyl - Driveline culture with S aureus. Blood cx NGTD  - 3/27 abx narrowed to cefazolin 2gm IV Q 8hr => stopped 3/31 and got ortivancin 4/1  - CT abd/pelvis- soft tissue infection. No deep  infection - 4/5 small pustule on abdomen noted >> performed superficial I&D at bedside, no communication with DL tunnel, cultures sent and pending. Culture results pending.  -4/8 now open area at medial aspect communicates with driveline. Increased induration noted. This area was packed with 1/2 inch packing strip.  - Cefazolin restarted.  - CT with abscess adjacent to driveline.  - Aim for surgical debridement in OR Monday.   2.  Chronic Systolic HF due to NICM - Echo EF 10% with biventricular failure - HM-3 VAD implant 02/26/19 - Echo 7/21 EF 40-45% - No bb or ARB with previous dehydration/RV failure.  - VAD interrogated personally. Parameters stable. - MAP 80s - Coreg 6.25 mg twice a day.   - Continue losartan - Hold warfarin  with plan for OR Monday, remains on heparin gtt.   3. Anemia - Baseline hgb has been upper 9's, admission hgb 7.7 => 6.1.  However, hgb up to 10.5 after 2 units PRCs and stool was heme negative.  Does not appear to be actively GI bleeding, ?initial drop dilutional with IVF at admission.   - Hgb 9/2 today with some bleeding at driveline.    4. F/E/N - We continue to worry about his nutritional status after his mother died. - Appetite has been great in the hospital.  5. Broken Teeth - 3/31 s/p extractions of teeth #5 and 15  - Appreciate dental surgery  - site looks good  6. Hypokalemia/ Hypomagnesemia  - Mg 1.7, will supplement.   I reviewed the LVAD parameters from today, and compared the results to the patient's prior recorded data.  No programming changes were made.  The LVAD is functioning within specified parameters.  The patient performs LVAD self-test daily.  LVAD interrogation was negative for any significant power changes, alarms or PI events/speed drops.  LVAD equipment check completed and is in good working order.  Back-up equipment present.   LVAD education done on emergency procedures and precautions and reviewed exit site care.  Length of  Stay: 73  Marca Ancona, MD 05/13/2020, 10:04 AM  VAD Team --- VAD ISSUES ONLY--- Pager [phone] (7am - 7am)  Advanced Heart Failure Team  Pager [phone] (M-F; 7a - 5p)  Please contact [facility] for night-coverage after hours (5p -7a ) and weekends on [email or web address]

## 2020-05-14 DIAGNOSIS — T827XXA Infection and inflammatory reaction due to other cardiac and vascular devices, implants and grafts, initial encounter: Secondary | ICD-10-CM | POA: Diagnosis not present

## 2020-05-14 DIAGNOSIS — I5022 Chronic systolic (congestive) heart failure: Secondary | ICD-10-CM | POA: Diagnosis not present

## 2020-05-14 LAB — COMPREHENSIVE METABOLIC PANEL
ALT: 39 U/L (ref 0–44)
AST: 54 U/L — ABNORMAL HIGH (ref 15–41)
Albumin: 3.4 g/dL — ABNORMAL LOW (ref 3.5–5.0)
Alkaline Phosphatase: 116 U/L (ref 38–126)
Anion gap: 8 (ref 5–15)
BUN: 6 mg/dL (ref 6–20)
CO2: 26 mmol/L (ref 22–32)
Calcium: 9.2 mg/dL (ref 8.9–10.3)
Chloride: 103 mmol/L (ref 98–111)
Creatinine, Ser: 0.76 mg/dL (ref 0.61–1.24)
GFR, Estimated: >60 mL/min (ref >60)
Glucose, Bld: 92 mg/dL (ref 70–99)
Potassium: 3.6 mmol/L (ref 3.5–5.1)
Sodium: 137 mmol/L (ref 135–145)
Total Bilirubin: 0.6 mg/dL (ref 0.3–1.2)
Total Protein: 6.9 g/dL (ref 6.5–8.1)

## 2020-05-14 LAB — HEPARIN LEVEL (UNFRACTIONATED): Heparin Unfractionated: 0.27 IU/mL — ABNORMAL LOW (ref 0.30–0.70)

## 2020-05-14 LAB — CBC
HCT: 28.2 % — ABNORMAL LOW (ref 39.0–52.0)
Hemoglobin: 8.9 g/dL — ABNORMAL LOW (ref 13.0–17.0)
MCH: 30.7 pg (ref 26.0–34.0)
MCHC: 31.6 g/dL (ref 30.0–36.0)
MCV: 97.2 fL (ref 80.0–100.0)
Platelets: 303 10*3/uL (ref 150–400)
RBC: 2.9 MIL/uL — ABNORMAL LOW (ref 4.22–5.81)
RDW: 20.6 % — ABNORMAL HIGH (ref 11.5–15.5)
WBC: 6.5 10*3/uL (ref 4.0–10.5)
nRBC: 0 % (ref 0.0–0.2)

## 2020-05-14 LAB — PREALBUMIN: Prealbumin: 19.4 mg/dL (ref 18–38)

## 2020-05-14 LAB — PHOSPHORUS: Phosphorus: 3.7 mg/dL (ref 2.5–4.6)

## 2020-05-14 LAB — LACTATE DEHYDROGENASE: LDH: 197 U/L — ABNORMAL HIGH (ref 98–192)

## 2020-05-14 LAB — PROTIME-INR
INR: 1.8 — ABNORMAL HIGH (ref 0.8–1.2)
Prothrombin Time: 20.2 seconds — ABNORMAL HIGH (ref 11.4–15.2)

## 2020-05-14 LAB — MAGNESIUM: Magnesium: 1.8 mg/dL (ref 1.7–2.4)

## 2020-05-14 MED ORDER — POTASSIUM CHLORIDE CRYS ER 20 MEQ PO TBCR
40.0000 meq | EXTENDED_RELEASE_TABLET | Freq: Once | ORAL | Status: AC
  Administered 2020-05-14: 40 meq via ORAL
  Filled 2020-05-14: qty 2

## 2020-05-14 MED ORDER — MAGNESIUM SULFATE 2 GM/50ML IV SOLN
2.0000 g | Freq: Once | INTRAVENOUS | Status: AC
  Administered 2020-05-14: 2 g via INTRAVENOUS
  Filled 2020-05-14: qty 50

## 2020-05-14 NOTE — Plan of Care (Signed)
?  Problem: Education: ?Goal: Knowledge of General Education information will improve ?Description: Including pain rating scale, medication(s)/side effects and non-pharmacologic comfort measures ?Outcome: Progressing ?  ?Problem: Health Behavior/Discharge Planning: ?Goal: Ability to manage health-related needs will improve ?Outcome: Progressing ?  ?Problem: Clinical Measurements: ?Goal: Ability to maintain clinical measurements within normal limits will improve ?Outcome: Progressing ?Goal: Will remain free from infection ?Outcome: Progressing ?Goal: Diagnostic test results will improve ?Outcome: Progressing ?Goal: Respiratory complications will improve ?Outcome: Progressing ?Goal: Cardiovascular complication will be avoided ?Outcome: Progressing ?  ?Problem: Activity: ?Goal: Risk for activity intolerance will decrease ?Outcome: Progressing ?  ?Problem: Nutrition: ?Goal: Adequate nutrition will be maintained ?Outcome: Progressing ?  ?Problem: Coping: ?Goal: Level of anxiety will decrease ?Outcome: Progressing ?  ?Problem: Elimination: ?Goal: Will not experience complications related to bowel motility ?Outcome: Progressing ?Goal: Will not experience complications related to urinary retention ?Outcome: Progressing ?  ?Problem: Pain Managment: ?Goal: General experience of comfort will improve ?Outcome: Progressing ?  ?Problem: Safety: ?Goal: Ability to remain free from injury will improve ?Outcome: Progressing ?  ?Problem: Skin Integrity: ?Goal: Risk for impaired skin integrity will decrease ?Outcome: Progressing ?  ?Problem: Education: ?Goal: Patient will understand all VAD equipment and how it functions ?Outcome: Progressing ?Goal: Patient will be able to verbalize current INR target range and antiplatelet therapy for discharge home ?Outcome: Progressing ?  ?Problem: Cardiac: ?Goal: LVAD will function as expected and patient will experience no clinical alarms ?Outcome: Progressing ?  ?

## 2020-05-14 NOTE — Progress Notes (Addendum)
Patient ID: Jon Martin, male   DOB: 11/21/1985, 35 y.o.   MRN: [id number]   Advanced Heart Failure VAD Team Note  PCP-Cardiologist: No primary care provider on file.   Subjective:     HM3 LVAD admitted w/ Drive-line infection.  - Wound culture with S aureus. Blood cx NGTD - 3/26 abx narrowed to cefazolin 2gm IV Q 8hr. ID following  - 3/28 CT abd/pelvis soft tissue no deep infection. Oral Surgeon consulted.  - 3/31 s/p extractions of teeth #5 and 15  - 3/31 Low flow alarms last night, got 1 L IVF and low flows have stopped - 4/5 small pustule on abdomen noted>> performed superficial I&D at bedside, no communication with DL tunnel, cultures sent and pending  - 4/8 Abscess at driveline site opened . CT abdomen done, abscess adjacent to driveline noted.  Remains on heparin drip.  INR 1.1>>1.3 >>1.5 >>1.7>>1.8 today (warfarin stopped again due to potential need for wound I&D)  Had bleeding at DL site yesterday. Seems to have slowed today  Remains on IV abx   No fevers or chills. Denies CP, sob, orthopnea or PND.   LVAD INTERROGATION:   HeartMate 3 LVAD:   Flow 3.7 liters/min, speed 5400, power 4.0  PI 4.7 + PI events   VAD interrogated personally. Parameters stable.   Objective:    Vital Signs:   Temp:  [97.9 F (36.6 C)-99 F (37.2 C)] 98.6 F (37 C) (04/10 1043) Pulse Rate:  [51-97] 96 (04/10 1043) Resp:  [14-20] 20 (04/10 1043) BP: (89-115)/(57-91) 94/64 (04/10 1043) SpO2:  [96 %-100 %] 99 % (04/10 0317) Weight:  [55.7 kg-56 kg] 55.7 kg (04/10 0500) Last BM Date: 05/03/20   Mean arterial Pressure 80-90s  Intake/Output:   Intake/Output Summary (Last 24 hours) at 05/14/2020 1208 Last data filed at 05/14/2020 0900 Gross per 24 hour  Intake 1231.64 ml  Output 3300 ml  Net -2068.36 ml     Physical Exam   General:  Sitting up in bed. NAD.  HEENT: normal  Neck: supple. JVP not elevated.  Carotids 2+ bilat; no bruits. No lymphadenopathy or thryomegaly  appreciated. Cor: LVAD hum.  Lungs: Clear. Abdomen: obese soft, non-distended. No hepatosplenomegaly. No bruits or masses. Good bowel sounds. Driveline site with dressing in place + indurated. Mildly tender. Anchor in place.  Extremities: no cyanosis, clubbing, rash. Warm no edema  Neuro: alert & oriented x 3. No focal deficits. Moves all 4 without problem    Telemetry   Sinus 90-110, Personally reviewed  Labs   Basic Metabolic Panel: Recent Labs  Lab 05/10/20 0045 05/10/20 0831 05/11/20 0115 05/12/20 0030 05/13/20 0051 05/14/20 0022  NA  --  136 136 137 137 137  K  --  3.4* 3.6 4.0 3.6 3.6  CL  --  102 104 104 103 103  CO2  --  25 24 25 26 26   GLUCOSE  --  104* 112* 99 96 92  BUN  --  7 8 6 6 6   CREATININE  --  0.84 0.75 0.74 0.78 0.76  CALCIUM  --  9.4 9.6 9.3 9.1 9.2  MG 1.8  --  1.8 2.0 1.7 1.8  PHOS  --   --   --   --   --  3.7    Liver Function Tests: Recent Labs  Lab 05/14/20 0022  AST 54*  ALT 39  ALKPHOS 116  BILITOT 0.6  PROT 6.9  ALBUMIN 3.4*   No results for input(s): LIPASE,  AMYLASE in the last 168 hours. No results for input(s): AMMONIA in the last 168 hours.  CBC: Recent Labs  Lab 05/10/20 0045 05/11/20 0115 05/12/20 0030 05/13/20 0051 05/14/20 0022  WBC 7.8 7.9 8.2 8.3 6.5  HGB 9.6* 9.3* 8.9* 9.2* 8.9*  HCT 30.9* 29.7* 28.5* 27.7* 28.2*  MCV 97.5 97.7 96.0 96.9 97.2  PLT 377 355 343 336 303    INR: Recent Labs  Lab 05/10/20 0045 05/11/20 0115 05/12/20 0030 05/13/20 0051 05/14/20 0022  INR 1.3* 1.5* 1.7* 1.7* 1.8*    Other results:    Imaging   No results found.   Medications:     Scheduled Medications: . carvedilol  6.25 mg Oral BID WC  . losartan  25 mg Oral Daily  . pantoprazole  40 mg Oral Daily  . sodium chloride flush  3 mL Intravenous Q12H  . Thrombi-Pad  1 each Topical Once    Infusions: . sodium chloride    .  ceFAZolin (ANCEF) IV 2 g (05/14/20 2229)  . heparin 900 Units/hr (05/14/20 0600)  .  lactated ringers 10 mL/hr at 05/04/20 0921  . lactated ringers 10 mL/hr at 05/04/20 1136    PRN Medications: sodium chloride, acetaminophen, ondansetron (ZOFRAN) IV   Patient Profile   35 y/o male w/ chronic systolic HF s/p HM3 LVAD admitted w/ staph aureus drive-line infection   Assessment/Plan:    1. 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 -  Admitted for IV abx vancomycin/cefepime/Flagyl - Driveline culture with S aureus. Blood cx NGTD  - 3/27 abx narrowed to cefazolin 2gm IV Q 8hr => stopped 3/31 and got ortivancin 4/1  - CT abd/pelvis- soft tissue infection. No deep infection - 4/5 small pustule on abdomen noted >> performed superficial I&D at bedside, no communication with DL tunnel, cultures sent and pending. Culture results pending.  -4 /8 now open area at medial aspect communicates with driveline. Increased induration noted. This area was packed with 1/2 inch packing strip.  - Cefazolin restarted.  - CT with abscess adjacent to driveline.  - had bleeding at DL site yesterday. Now seems to have slowed. Need to follow closely.  - Likely will need surgical debridement in OR. Dr. Laneta Simmers to see in am. Warfarin on hold.   2.  Chronic Systolic HF due to NICM/VAD management - Echo EF 10% with biventricular failure - HM-3 VAD implant 02/26/19 - Echo 7/21 EF 40-45% - VAD interrogated personally. Parameters stable. - MAP 80-90s - Coreg 6.25 mg twice a day.   - Continue losartan - LDH 197  3. Anemia - Baseline hgb has been upper 9's, admission hgb 7.7 => 6.1.  However, hgb up to 10.5 after 2 units PRCs and stool was heme negative.  Does not appear to be actively GI bleeding, ?initial drop dilutional with IVF at admission.   - Hgb 9.2 -> 8.9 today watch closely  4. F/E/N - We continue to worry about his nutritional status after his mother died. - Appetite has been great in the hospital.  5. Broken Teeth - 3/31 s/p extractions  of teeth #5 and 15  - Appreciate dental surgery  - site looks good  6. Hypokalemia/ Hypomagnesemia  - Mg 1.8, k3.6, will supplement.   I reviewed the LVAD parameters from today, and compared the results to the patient's prior recorded data.  No programming changes were made.  The LVAD is functioning within specified parameters.  The patient performs  LVAD self-test daily.  LVAD interrogation was negative for any significant power changes, alarms or PI events/speed drops.  LVAD equipment check completed and is in good working order.  Back-up equipment present.   LVAD education done on emergency procedures and precautions and reviewed exit site care.  Length of Stay: 43  Arvilla Meres, MD 05/14/2020, 12:08 PM  VAD Team --- VAD ISSUES ONLY--- Pager [phone] (7am - 7am)  Advanced Heart Failure Team  Pager [phone] (M-F; 7a - 5p)  Please contact [facility] for night-coverage after hours (5p -7a ) and weekends on [email or web address]

## 2020-05-14 NOTE — Progress Notes (Signed)
ANTICOAGULATION CONSULT NOTE  Pharmacy Consult for heparin Indication: LVAD  No Known Allergies  Patient Measurements: Height: 5\' 5"  (165.1 cm) Weight: 55.7 kg (122 lb 12.7 oz) IBW/kg (Calculated) : 61.5  Heparin dosing weight: 55.2 kg  Vital Signs: Temp: 98.6 F (37 C) (04/10 1043) Temp Source: Oral (04/10 1043) BP: 94/64 (04/10 1043) Pulse Rate: 96 (04/10 1043)  Labs: Recent Labs    05/12/20 0030 05/13/20 0051 05/14/20 0022  HGB 8.9* 9.2* 8.9*  HCT 28.5* 27.7* 28.2*  PLT 343 336 303  LABPROT 19.2* 19.6* 20.2*  INR 1.7* 1.7* 1.8*  HEPARINUNFRC 0.28* 0.20* 0.27*  CREATININE 0.74 0.78 0.76    Estimated Creatinine Clearance: 102.5 mL/min (by C-G formula based on SCr of 0.76 mg/dL).   Medical History: Past Medical History:  Diagnosis Date  . Dilated cardiomyopathy (HCC)   . Dilated cardiomyopathy (HCC) 02/2019  . Polysubstance abuse (HCC)     Assessment: 78 yom presenting with concern for driveline infection - hx HM3 implanted on 02/26/19 - on warfarin PTA. PTA regimen is 2.5 mg daily. He is now s/p dental extractions on 3/31.  INR up to 1.8 today despite holding Coumadin last night.  Bleeding from site where abscess was lanced, now improved.  CBC fairly stable.  Heparin level is 0.27.  Targeting level around 0.3.  Goal of Therapy:  Heparin level ~0.3 INR 2-2.5 Monitor platelets by anticoagulation protocol: Yes   Plan:  Continue IV heparin at 900 units/hr.  Heparin to be off Monday AM for OR. Watch bleeding site carefully. AM CBC and heparin level.  Saturday, Reece Leader, BCCP Clinical Pharmacist  05/14/2020 1:44 PM   [facility] pharmacy phone numbers are listed on [email or web address]

## 2020-05-15 DIAGNOSIS — I5022 Chronic systolic (congestive) heart failure: Secondary | ICD-10-CM

## 2020-05-15 DIAGNOSIS — T827XXA Infection and inflammatory reaction due to other cardiac and vascular devices, implants and grafts, initial encounter: Secondary | ICD-10-CM

## 2020-05-15 DIAGNOSIS — Z95811 Presence of heart assist device: Secondary | ICD-10-CM

## 2020-05-15 LAB — BASIC METABOLIC PANEL
Anion gap: 8 (ref 5–15)
BUN: 6 mg/dL (ref 6–20)
CO2: 25 mmol/L (ref 22–32)
Calcium: 9.5 mg/dL (ref 8.9–10.3)
Chloride: 104 mmol/L (ref 98–111)
Creatinine, Ser: 0.83 mg/dL (ref 0.61–1.24)
GFR, Estimated: >60 mL/min (ref >60)
Glucose, Bld: 104 mg/dL — ABNORMAL HIGH (ref 70–99)
Potassium: 3.7 mmol/L (ref 3.5–5.1)
Sodium: 137 mmol/L (ref 135–145)

## 2020-05-15 LAB — HEPARIN LEVEL (UNFRACTIONATED): Heparin Unfractionated: 0.22 IU/mL — ABNORMAL LOW (ref 0.30–0.70)

## 2020-05-15 LAB — MAGNESIUM: Magnesium: 1.7 mg/dL (ref 1.7–2.4)

## 2020-05-15 LAB — PROTIME-INR
INR: 1.4 — ABNORMAL HIGH (ref 0.8–1.2)
Prothrombin Time: 16.8 seconds — ABNORMAL HIGH (ref 11.4–15.2)

## 2020-05-15 LAB — LACTATE DEHYDROGENASE: LDH: 212 U/L — ABNORMAL HIGH (ref 98–192)

## 2020-05-15 MED ORDER — WARFARIN - PHARMACIST DOSING INPATIENT: Freq: Every day | Status: DC

## 2020-05-15 MED ORDER — WARFARIN SODIUM 5 MG PO TABS
5.0000 mg | ORAL_TABLET | Freq: Every day | ORAL | Status: DC
  Administered 2020-05-15: 5 mg via ORAL
  Filled 2020-05-15: qty 1

## 2020-05-15 MED ORDER — MAGNESIUM SULFATE 4 GM/100ML IV SOLN
4.0000 g | Freq: Once | INTRAVENOUS | Status: AC
  Administered 2020-05-15: 4 g via INTRAVENOUS
  Filled 2020-05-15: qty 100

## 2020-05-15 MED ORDER — POTASSIUM CHLORIDE CRYS ER 20 MEQ PO TBCR
40.0000 meq | EXTENDED_RELEASE_TABLET | Freq: Once | ORAL | Status: AC
  Administered 2020-05-15: 40 meq via ORAL
  Filled 2020-05-15: qty 2

## 2020-05-15 NOTE — Progress Notes (Signed)
ID PROGRESS NOTE  ID: 35yo M with MSSA LVAD drive line infection/adjacent abscess  - continue on cefazolin for the time being - I x D slated for tomorrow to help with source control  Jaimes Eckert B. Drue Second MD MPH [facility] [phone]

## 2020-05-15 NOTE — Progress Notes (Addendum)
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   HM III LVAD implanted on 02/26/19 by Dr. Maren Beach under Destination Therapy criteria.  Patient awake, alert, pleasant this am. He denies complaints, says he feels good today. He reports he is waiting for surgeon to come by and see him to assess whether surgical intervention is required for abscess/DL site.   Vital signs: Temp: 98.6 HR: 92 Doppler Pressure: 80/0  Automatic BP:  93/72 (79) O2 Sat: 98% RA  Wt: 122.8>123>124.1>120.8>121.4>121.6>121.9>123.2>121.9>120.8>122 lbs   LVAD interrogation reveals:  Speed: 5400 Flow: 3.8 Power: 3.9w PI: 4.4 Hct: 20 - do not change  Alarms: none Events: 60+ today  Fixed speed: 5400 Low speed limit: 5100  Drive Line: Existing VAD dressing removed and site care performed using sterile technique. Drive line exit site cleaned with Chlora prep applicators x 2, RINSED WITH SALINE, allowed to dry, and gauze dressing with silver strip re-applied. Exit site healed and incorporated, the velour is fully implanted at exit site. No redness, tenderness, drainage, foul odor or rash noted. Drive line anchor re-applied. Daily dressing dressing changes per BS nurse. Next dressing change due 05/16/20.  Abdominal Abcsess: Dressing removed; removed packing strip. Cleaned with CHG and re-packed with 23 cm of iodoform strip and covered with gauze dressing. Continue daily dressing changes of this site per [facility] nurse.    Labs:  LDH trend: 181>171>185>196>193>295>233>213>222>197>212  INR trend: 2.3>1.7>1.2>1.1>1.1>1.0>1.0>1.1>1.5>1.4  Anticoagulation Plan: -INR Goal: 2.0 - 2.5 -ASA Dose: none  Blood Products:  - 04/29/20>>2 units PCs  Device: N/A  Infection:  - 04/28/20 BCs>> No growth final - 04/28/20 DL culture>>rare staph aureus; final  - 05/04/20 BC>> No growth, final - 05/09/20 Abdominal abscess culture>> Staph aureus  Drips:  Heparin @ 900  units/hr  Plan/Recommendations:  1. Call VAD Coordinator if any VAD equipment or drive line issues. 2. Daily dressing changes on driveline.  3. Dr. Laneta Simmers to see patient and evaluate need for surgical debridement of abscess/drive line site. Pt remains NPO   [name] RN VAD Coordinator  Office: [phone]  24/7 Pager: [phone]

## 2020-05-15 NOTE — Plan of Care (Signed)
?  Problem: Education: ?Goal: Knowledge of General Education information will improve ?Description: Including pain rating scale, medication(s)/side effects and non-pharmacologic comfort measures ?Outcome: Progressing ?  ?Problem: Health Behavior/Discharge Planning: ?Goal: Ability to manage health-related needs will improve ?Outcome: Progressing ?  ?Problem: Clinical Measurements: ?Goal: Ability to maintain clinical measurements within normal limits will improve ?Outcome: Progressing ?Goal: Will remain free from infection ?Outcome: Progressing ?Goal: Diagnostic test results will improve ?Outcome: Progressing ?Goal: Respiratory complications will improve ?Outcome: Progressing ?Goal: Cardiovascular complication will be avoided ?Outcome: Progressing ?  ?Problem: Activity: ?Goal: Risk for activity intolerance will decrease ?Outcome: Progressing ?  ?Problem: Nutrition: ?Goal: Adequate nutrition will be maintained ?Outcome: Progressing ?  ?Problem: Coping: ?Goal: Level of anxiety will decrease ?Outcome: Progressing ?  ?Problem: Elimination: ?Goal: Will not experience complications related to bowel motility ?Outcome: Progressing ?Goal: Will not experience complications related to urinary retention ?Outcome: Progressing ?  ?Problem: Pain Managment: ?Goal: General experience of comfort will improve ?Outcome: Progressing ?  ?Problem: Safety: ?Goal: Ability to remain free from injury will improve ?Outcome: Progressing ?  ?Problem: Skin Integrity: ?Goal: Risk for impaired skin integrity will decrease ?Outcome: Progressing ?  ?Problem: Education: ?Goal: Patient will understand all VAD equipment and how it functions ?Outcome: Progressing ?Goal: Patient will be able to verbalize current INR target range and antiplatelet therapy for discharge home ?Outcome: Progressing ?  ?Problem: Cardiac: ?Goal: LVAD will function as expected and patient will experience no clinical alarms ?Outcome: Progressing ?  ?

## 2020-05-15 NOTE — Progress Notes (Signed)
Had irregular rhythm on the monitor. asymptomatic Print out showed to NP.  EKG done.NP aware. No orders. Continue to monitor.

## 2020-05-15 NOTE — Progress Notes (Addendum)
Patient ID: Jon Martin, male   DOB: Jan 21, 1986, 35 y.o.   MRN: [id number]   Advanced Heart Failure VAD Team Note  PCP-Cardiologist: No primary care provider on file.   Subjective:     HM3 LVAD admitted w/ Drive-line infection.  - Wound culture with S aureus. Blood cx NGTD - 3/26 abx narrowed to cefazolin 2gm IV Q 8hr. ID following  - 3/28 CT abd/pelvis soft tissue no deep infection. Oral Surgeon consulted.  - 3/31 s/p extractions of teeth #5 and 15  - 3/31 Low flow alarms last night, got 1 L IVF and low flows have stopped - 4/5 small pustule on abdomen noted>> performed superficial I&D at bedside, no communication with DL tunnel, cultures sent and pending  - 4/8 Abscess at driveline site opened . CT abdomen done, abscess adjacent to driveline noted.  Remains on heparin drip.  INR 1.1>>1.3 >>1.5 >>1.7>>1.8>>1.4 today (warfarin stopped again due to potential need for wound I&D). No further bleeding from DL site.   Remains on IV abx. Denies fever/chills. No abdominal pain. No dyspnea.   LVAD INTERROGATION:   HeartMate 3 LVAD:   Flow 3.6 liters/min, speed 5400, power 3.9  PI 5.1 Multiple events   VAD interrogated personally. Parameters stable.   Objective:    Vital Signs:   Temp:  [98.1 F (36.7 C)-98.9 F (37.2 C)] 98.9 F (37.2 C) (04/11 0300) Pulse Rate:  [91-96] 92 (04/11 0627) Resp:  [14-22] 22 (04/11 0300) BP: (88-111)/(55-90) 102/90 (04/11 0627) SpO2:  [98 %-99 %] 98 % (04/11 0300) Weight:  [56.5 kg] 56.5 kg (04/11 0300) Last BM Date: 05/14/20   Mean arterial Pressure 80-90s  Intake/Output:   Intake/Output Summary (Last 24 hours) at 05/15/2020 0708 Last data filed at 05/15/2020 0400 Gross per 24 hour  Intake 1577.15 ml  Output 1250 ml  Net 327.15 ml     Physical Exam   General:  Thin young AAM, Sitting up in bed. No distress HEENT: normal  Neck: supple. JVP not elevated.  Carotids 2+ bilat; no bruits. No lymphadenopathy or thryomegaly appreciated. Cor: LVAD  hum.  Lungs: CTAB. No wheezing  Abdomen: obese soft, non-distended. No hepatosplenomegaly. No bruits or masses. Good bowel sounds. Driveline site with dressing in place. No visible drainage. Anchor in place.  Extremities: no cyanosis, clubbing, rash. Warm with no edema  Neuro: alert & oriented x 3. No focal deficits. Moves all 4 without problem    Telemetry   Sinus 80s-90s, occasional PVCs Personally reviewed  Labs   Basic Metabolic Panel: Recent Labs  Lab 05/11/20 0115 05/12/20 0030 05/13/20 0051 05/14/20 0022 05/15/20 0045  NA 136 137 137 137 137  K 3.6 4.0 3.6 3.6 3.7  CL 104 104 103 103 104  CO2 24 25 26 26 25   GLUCOSE 112* 99 96 92 104*  BUN 8 6 6 6 6   CREATININE 0.75 0.74 0.78 0.76 0.83  CALCIUM 9.6 9.3 9.1 9.2 9.5  MG 1.8 2.0 1.7 1.8 1.7  PHOS  --   --   --  3.7  --     Liver Function Tests: Recent Labs  Lab 05/14/20 0022  AST 54*  ALT 39  ALKPHOS 116  BILITOT 0.6  PROT 6.9  ALBUMIN 3.4*   No results for input(s): LIPASE, AMYLASE in the last 168 hours. No results for input(s): AMMONIA in the last 168 hours.  CBC: Recent Labs  Lab 05/10/20 0045 05/11/20 0115 05/12/20 0030 05/13/20 0051 05/14/20 0022  WBC 7.8 7.9  8.2 8.3 6.5  HGB 9.6* 9.3* 8.9* 9.2* 8.9*  HCT 30.9* 29.7* 28.5* 27.7* 28.2*  MCV 97.5 97.7 96.0 96.9 97.2  PLT 377 355 343 336 303    INR: Recent Labs  Lab 05/11/20 0115 05/12/20 0030 05/13/20 0051 05/14/20 0022 05/15/20 0045  INR 1.5* 1.7* 1.7* 1.8* 1.4*    Other results:    Imaging   No results found.   Medications:     Scheduled Medications: . carvedilol  6.25 mg Oral BID WC  . losartan  25 mg Oral Daily  . pantoprazole  40 mg Oral Daily  . sodium chloride flush  3 mL Intravenous Q12H  . Thrombi-Pad  1 each Topical Once    Infusions: . sodium chloride    .  ceFAZolin (ANCEF) IV 2 g (05/15/20 0626)  . heparin 900 Units/hr (05/15/20 0400)  . lactated ringers 10 mL/hr at 05/04/20 0921  . lactated ringers  10 mL/hr at 05/04/20 1136    PRN Medications: sodium chloride, acetaminophen, ondansetron (ZOFRAN) IV   Patient Profile   35 y/o male w/ chronic systolic HF s/p HM3 LVAD admitted w/ staph aureus drive-line infection   Assessment/Plan:    1. 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 -  Admitted for IV abx vancomycin/cefepime/Flagyl - Driveline culture with S aureus. Blood cx NGTD  - 3/27 abx narrowed to cefazolin 2gm IV Q 8hr => stopped 3/31 and got ortivancin 4/1  - CT abd/pelvis- soft tissue infection. No deep infection - 4/5 small pustule on abdomen noted >> performed superficial I&D at bedside, no communication with DL tunnel, cultures growing GPC.  -4/8 now open area at medial aspect communicates with driveline. Increased induration noted. This area was packed with 1/2 inch packing strip.  - Cefazolin restarted.  - CT with abscess adjacent to driveline.  - had bleeding at DL over the weekend. Now seems to have slowed. Need to follow closely.  - Likely will need surgical debridement in OR. Dr. Laneta Simmers to see. Warfarin on hold.    2.  Chronic Systolic HF due to NICM/VAD management - Echo EF 10% with biventricular failure - HM-3 VAD implant 02/26/19 - Echo 7/21 EF 40-45% - VAD interrogated personally. Parameters stable. - MAP 70-90s - Coreg 6.25 mg twice a day.   - Continue losartan - LDH 212  3. Anemia - Baseline hgb has been upper 9's, admission hgb 7.7 => 6.1.  However, hgb up to 10.5 after 2 units PRCs and stool was heme negative.  Does not appear to be actively GI bleeding, ?initial drop dilutional with IVF at admission.   - Hgb 9.2 -> 8.9 watch closely  4. F/E/N - We continue to worry about his nutritional status after his mother died. - Appetite has been great in the hospital.  5. Broken Teeth - 3/31 s/p extractions of teeth #5 and 15  - Appreciate dental surgery  - site looks good  6. Hypokalemia/ Hypomagnesemia   - Mg 1.7, K 3.7, will supplement.   I reviewed the LVAD parameters from today, and compared the results to the patient's prior recorded data.  No programming changes were made.  The LVAD is functioning within specified parameters.  The patient performs LVAD self-test daily.  LVAD interrogation was negative for any significant power changes, alarms or PI events/speed drops.  LVAD equipment check completed and is in good working order.  Back-up equipment present.   LVAD education done on emergency procedures  and precautions and reviewed exit site care.  Length of Stay: [address] PA-C 05/15/2020, 7:08 AM  VAD Team --- VAD ISSUES ONLY--- Pager [phone] (7am - 7am)  Advanced Heart Failure Team  Pager [phone] (M-F; 7a - 5p)  Please contact [facility] for night-coverage after hours (5p -7a ) and weekends on [email or web address]  Patient seen and examined with the above-signed Advanced Practice Provider and/or Housestaff. I personally reviewed laboratory data, imaging studies and relevant notes. I independently examined the patient and formulated the important aspects of the plan. I have edited the note to reflect any of my changes or salient points. I have personally discussed the plan with the patient and/or family.   Remains on IV abx for Dl site infection. No further bleeding from site. Remains on heparin and warfarin. INR down to 1.4. No fevers or chills. Volume status ok. Mag low.   General:  NAD.  HEENT: normal  Neck: supple. JVP not elevated.  Carotids 2+ bilat; no bruits. No lymphadenopathy or thryomegaly appreciated. Cor: LVAD hum.  Lungs: Clear. Abdomen:soft, non-distended. No hepatosplenomegaly. No bruits or masses. Good bowel sounds. Driveline site dressing dry. Minimallly tender at site. Anchor in place.  Extremities: no cyanosis, clubbing, rash. Warm no edema  Neuro: alert & oriented x 3. No focal deficits. Moves all 4 without problem   No further bleeding at DL site.  Susepct he may need I&D at site. Await Dr. Sharee Pimple eval to see if he needs to go to OR. Continue heparin. Will give one dose warfarin today. Discussed dosing with PharmD personally. VAD interrogated personally. Parameters stable.  Arvilla Meres, MD  8:14 AM

## 2020-05-15 NOTE — Consult Note (Signed)
[address]       Jon Martin [id number]             [phone]      Cardiothoracic Surgery Consultation  Reason for Consult: HM lll LVAD drive line infection Referring Physician: Dr. Lilian Kapur Jon Martin is an 35 y.o. male.  HPI:   The patient is a 35 year old gentleman with a history of severe systolic heart failure due to nonischemic cardiomyopathy who underwent HeartMate 3 LVAD insertion by Dr. Maren Beach on 02/26/2019.  He underwent extractions of 2 retained roots by Dr. Chales Salmon on 05/04/2020.  He has recovered from this without complication.  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  He was noted to have malodorous drainage from the driveline exit site with no tunneling.  His blood culture on admission was negative.  Driveline culture grew rare staph aureus.  He was seen by infectious disease and his antibiotics were narrowed to cefazolin.  CT of the abdomen and pelvis on 05/01/2020 showed subcutaneous soft tissue edema surrounding the driveline along the left anterior abdomen with no associated fluid collection.  He subsequently developed further swelling and a small pustule in the abdomen that was I&D at the bedside on 05/09/2020.  This area opened up further and was communicating with the driveline.  The wound was packed with iodoform gauze starting on 05/12/2020.  A follow-up CT of the abdomen and pelvis on 05/12/2020 showed a focal gas and fluid collection adjacent to the driveline left upper abdominal wall consistent with cellulitis and subcutaneous abscess.  This did not appear to track medially.  Subsequent cultures have grown staph aureus sensitive to oxacillin.  He has remained afebrile with a normal white blood cell count  Past Medical History:  Diagnosis Date  . Dilated cardiomyopathy (HCC)   . Dilated cardiomyopathy (HCC) 02/2019  . Polysubstance abuse [facility])      Past Surgical History:  Procedure Laterality Date  . INSERTION OF IMPLANTABLE LEFT VENTRICULAR ASSIST DEVICE N/A 02/26/2019   Procedure: INSERTION OF IMPLANTABLE LEFT VENTRICULAR ASSIST DEVICE;  Surgeon: Kerin Perna, MD;  Location: [facility] OR;  Service: Open Heart Surgery;  Laterality: N/A;  HM3  . IR THORACENTESIS ASP PLEURAL SPACE W/IMG GUIDE  03/10/2019  . MULTIPLE EXTRACTIONS WITH ALVEOLOPLASTY N/A 05/04/2020   Procedure: MULTIPLE EXTRACTION TEETH FIVE AND FIFTEEN WITH ALVEOLOPLASTY;  Surgeon: Sharman Cheek, DMD;  Location: [facility] OR;  Service: Dentistry;  Laterality: N/A;  . RIGHT HEART CATH N/A 02/25/2019   Procedure: RIGHT HEART CATH;  Surgeon: Dolores Patty, MD;  Location: [facility] INVASIVE CV LAB;  Service: Cardiovascular;  Laterality: N/A;  . RIGHT/LEFT HEART CATH AND CORONARY ANGIOGRAPHY N/A 02/11/2019   Procedure: RIGHT/LEFT HEART CATH AND CORONARY ANGIOGRAPHY;  Surgeon: Dolores Patty, MD;  Location: [facility] INVASIVE CV LAB;  Service: Cardiovascular;  Laterality: N/A;  . TEE WITHOUT CARDIOVERSION N/A 02/26/2019   Procedure: TRANSESOPHAGEAL ECHOCARDIOGRAM (TEE);  Surgeon: Donata Clay, Theron Arista, MD;  Location: [facility] OR;  Service: Open Heart Surgery;  Laterality: N/A;  . VIDEO BRONCHOSCOPY N/A 02/26/2019   Procedure: Video Bronchoscopy;  Surgeon: Kerin Perna, MD;  Location: [facility] OR;  Service: Open Heart Surgery;  Laterality: N/A;    Family History  Problem Relation Age of Onset  . CAD Father     Social History:  reports that he has quit smoking. His smoking use included  cigarettes. He has never used smokeless tobacco. He reports current alcohol use. He reports previous drug use.  Allergies: No Known Allergies  Medications:  I have reviewed the patient's current medications. Prior to Admission:  Medications Prior to Admission  Medication Sig Dispense Refill Last Dose  . pantoprazole (PROTONIX) 40 MG tablet Take 1 tablet (40 mg total) by mouth daily. 30 tablet 11 04/28/2020 at Unknown  time  . potassium chloride SA (KLOR-CON M20) 20 MEQ tablet Take 2 tablets (40 mEq total) by mouth daily. 60 tablet 4 04/28/2020 at Unknown time  . warfarin (COUMADIN) 5 MG tablet Take warfarin 2.5 mg (1/2 tablet) every day or as directed by HF Clinic. Only use NDC [phone]. (Patient taking differently: Take 5 mg by mouth daily. Take warfarin 2.5 mg (1/2 tablet) every day or as directed by HF Clinic. Only use NDC [phone].) 30 tablet 11 04/27/2020 at 6 pm  . sildenafil (REVATIO) 20 MG tablet Take 1 tablet (20 mg total) by mouth 3 (three) times daily. (Patient not taking: Reported on 04/28/2020) 90 tablet 5 Not Taking at Unknown time   Scheduled: . carvedilol  6.25 mg Oral BID WC  . losartan  25 mg Oral Daily  . pantoprazole  40 mg Oral Daily  . sodium chloride flush  3 mL Intravenous Q12H  . Warfarin - Pharmacist Dosing Inpatient   Does not apply q1600   Continuous: . sodium chloride    .  ceFAZolin (ANCEF) IV 2 g (05/15/20 1325)  . heparin 900 Units/hr (05/15/20 1500)  . lactated ringers 10 mL/hr at 05/04/20 0921  . lactated ringers 10 mL/hr at 05/04/20 1136   ZSW:FUXNATPRN:sodium chloride, acetaminophen, ondansetron (ZOFRAN) IV Anti-infectives (From admission, onward)   Start     Dose/Rate Route Frequency Ordered Stop   05/12/20 1400  ceFAZolin (ANCEF) IVPB 2g/100 mL premix        2 g 200 mL/hr over 30 Minutes Intravenous Every 8 hours 05/12/20 0925     05/06/20 0800  Oritavancin Diphosphate (ORBACTIV) 1,200 mg in dextrose 5 % IVPB        1,200 mg 333.3 mL/hr over 180 Minutes Intravenous Once 05/05/20 0930 05/06/20 1411   04/30/20 1700  ceFAZolin (ANCEF) IVPB 2g/100 mL premix        2 g 200 mL/hr over 30 Minutes Intravenous Every 8 hours 04/30/20 1610 05/05/20 1653   04/29/20 0800  vancomycin (VANCOREADY) IVPB 750 mg/150 mL  Status:  Discontinued        750 mg 150 mL/hr over 60 Minutes Intravenous Every 12 hours 04/28/20 1714 04/30/20 1610   04/28/20 1900  vancomycin (VANCOREADY) IVPB  1000 mg/200 mL        1,000 mg 200 mL/hr over 60 Minutes Intravenous  Once 04/28/20 1714 04/28/20 2114   04/28/20 1900  metroNIDAZOLE (FLAGYL) tablet 500 mg  Status:  Discontinued        500 mg Oral Every 8 hours 04/28/20 1741 04/30/20 1610   04/28/20 1800  ceFEPIme (MAXIPIME) 2 g in sodium chloride 0.9 % 100 mL IVPB  Status:  Discontinued        2 g 200 mL/hr over 30 Minutes Intravenous Every 8 hours 04/28/20 1714 04/30/20 1610      Results for orders placed or performed during the hospital encounter of 04/28/20 (from the past 48 hour(s))  Lactate dehydrogenase     Status: Abnormal   Collection Time: 05/14/20 12:22 AM  Result Value Ref Range   LDH 197 (H) 98 -  192 U/L    Comment: Performed at [facility] Lab, [address], [place], [place] [address]  Protime-INR     Status: Abnormal   Collection Time: 05/14/20 12:22 AM  Result Value Ref Range   Prothrombin Time 20.2 (H) 11.4 - 15.2 seconds   INR 1.8 (H) 0.8 - 1.2    Comment: (NOTE) INR goal varies based on device and disease states. Performed at [facility], [address], [place], [place] [address]   Magnesium     Status: None   Collection Time: 05/14/20 12:22 AM  Result Value Ref Range   Magnesium 1.8 1.7 - 2.4 mg/dL    Comment: Performed at [facility], [address], [place], [place] [address]  CBC     Status: Abnormal   Collection Time: 05/14/20 12:22 AM  Result Value Ref Range   WBC 6.5 4.0 - 10.5 K/uL   RBC 2.90 (L) 4.22 - 5.81 MIL/uL   Hemoglobin 8.9 (L) 13.0 - 17.0 g/dL   HCT 07.6 (L) 22.6 - 33.3 %   MCV 97.2 80.0 - 100.0 fL   MCH 30.7 26.0 - 34.0 pg   MCHC 31.6 30.0 - 36.0 g/dL   RDW 54.5 (H) 62.5 - 63.8 %   Platelets 303 150 - 400 K/uL   nRBC 0.0 0.0 - 0.2 %    Comment: Performed at [facility], [address], [place], [place] [address]  Heparin level (unfractionated)     Status: Abnormal   Collection Time: 05/14/20 12:22 AM  Result Value Ref Range   Heparin Unfractionated 0.27  (L) 0.30 - 0.70 IU/mL    Comment: (NOTE) If heparin results are below expected values, and patient dosage has  been confirmed, suggest follow up testing of antithrombin III levels. Performed at [facility], [address] [place], [place] [address]   Phosphorus     Status: None   Collection Time: 05/14/20 12:22 AM  Result Value Ref Range   Phosphorus 3.7 2.5 - 4.6 mg/dL    Comment: Performed at [facility], [address] [place], [place] [address]  Prealbumin     Status: None   Collection Time: 05/14/20 12:22 AM  Result Value Ref Range   Prealbumin 19.4 18 - 38 mg/dL    Comment: Performed at [facility], [address] [place], [place] [address]  Comprehensive metabolic panel     Status: Abnormal   Collection Time: 05/14/20 12:22 AM  Result Value Ref Range   Sodium 137 135 - 145 mmol/L   Potassium 3.6 3.5 - 5.1 mmol/L   Chloride 103 98 - 111 mmol/L   CO2 26 22 - 32 mmol/L   Glucose, Bld 92 70 - 99 mg/dL    Comment: Glucose reference range applies only to samples taken after fasting for at least 8 hours.   BUN 6 6 - 20 mg/dL   Creatinine, Ser 5.97 0.61 - 1.24 mg/dL   Calcium 9.2 8.9 - 41.6 mg/dL   Total Protein 6.9 6.5 - 8.1 g/dL   Albumin 3.4 (L) 3.5 - 5.0 g/dL   AST 54 (H) 15 - 41 U/L   ALT 39 0 - 44 U/L   Alkaline Phosphatase 116 38 - 126 U/L   Total Bilirubin 0.6 0.3 - 1.2 mg/dL   GFR, Estimated >38 >45 mL/min    Comment: (NOTE) Calculated using the CKD-EPI Creatinine Equation (2021)    Anion gap 8  5 - 15    Comment: Performed at [facility], [address], [place], [place] [address]  Lactate dehydrogenase     Status: Abnormal   Collection Time: 05/15/20 12:45 AM  Result Value Ref Range   LDH 212 (H) 98 - 192 U/L    Comment: Performed at [facility], [address], [place], [place] [address]  Protime-INR     Status: Abnormal   Collection Time: 05/15/20 12:45 AM  Result Value Ref Range   Prothrombin Time 16.8 (H) 11.4 - 15.2  seconds   INR 1.4 (H) 0.8 - 1.2    Comment: (NOTE) INR goal varies based on device and disease states. Performed at [facility], [address] [place], [place] [address]   Magnesium     Status: None   Collection Time: 05/15/20 12:45 AM  Result Value Ref Range   Magnesium 1.7 1.7 - 2.4 mg/dL    Comment: Performed at [facility], [address] [place], [place] [address]  Basic metabolic panel     Status: Abnormal   Collection Time: 05/15/20 12:45 AM  Result Value Ref Range   Sodium 137 135 - 145 mmol/L   Potassium 3.7 3.5 - 5.1 mmol/L   Chloride 104 98 - 111 mmol/L   CO2 25 22 - 32 mmol/L   Glucose, Bld 104 (H) 70 - 99 mg/dL    Comment: Glucose reference range applies only to samples taken after fasting for at least 8 hours.   BUN 6 6 - 20 mg/dL   Creatinine, Ser 6.19 0.61 - 1.24 mg/dL   Calcium 9.5 8.9 - 50.9 mg/dL   GFR, Estimated >32 >67 mL/min    Comment: (NOTE) Calculated using the CKD-EPI Creatinine Equation (2021)    Anion gap 8 5 - 15    Comment: Performed at [facility], [address] [place], [place] [address]  Heparin level (unfractionated)     Status: Abnormal   Collection Time: 05/15/20 12:45 AM  Result Value Ref Range   Heparin Unfractionated 0.22 (L) 0.30 - 0.70 IU/mL    Comment: (NOTE) If heparin results are below expected values, and patient dosage has  been confirmed, suggest follow up testing of antithrombin III levels. Performed at [facility], [address] [place], [place] [address]     No results found.  Review of Systems  All other systems reviewed and are negative.  Blood pressure 95/82, pulse (!) 101, temperature 98.6 F (37 C), resp. rate 17, height 5\' 5"  (1.651 m), weight 56.5 kg, SpO2 98 %. Physical Exam Constitutional:      Appearance: Normal appearance. He is normal weight.  Cardiovascular:     Heart sounds: No murmur heard.     Comments: Distant heart sounds with LVAD hum present Pulmonary:      Effort: Pulmonary effort is normal.     Breath sounds: Normal breath sounds.  Abdominal:     General: Abdomen is flat.     Palpations: Abdomen is soft.     Comments: Abdominal wound dressing in place and was just changed.  I did not remove it.  Neurological:     Mental Status: He is alert.     Assessment/Plan:  He has an abscess over the driveline tunnel in the left abdominal subcutaneous tissue which has spontaneously drained to the surface.  I think the best course of action is to evaluate this under anesthesia to make sure  there is complete drainage of the abscess.  He will likely require further debridement and wound VAC therapy and prolonged antibiotics.  I discussed the operative procedure with the patient including alternatives, benefits, and risks including the possibility that he may require long-term antibiotics and changing the wound VAC every few days.  Unfortunately does not live locally.  He understands and is in agreement to proceed.  I will plan to do this tomorrow afternoon.  Alleen Borne 05/15/2020, 5:16 PM

## 2020-05-15 NOTE — Progress Notes (Signed)
ANTICOAGULATION CONSULT NOTE  Pharmacy Consult for heparin Indication: LVAD  No Known Allergies  Patient Measurements: Height: 5\' 5"  (165.1 cm) Weight: 56.5 kg (124 lb 9 oz) IBW/kg (Calculated) : 61.5  Heparin dosing weight: 55.2 kg  Vital Signs: Temp: 98.6 F (37 C) (04/11 0741) Temp Source: Oral (04/11 0300) BP: 93/72 (04/11 0741) Pulse Rate: 92 (04/11 0627)  Labs: Recent Labs    05/13/20 0051 05/14/20 0022 05/15/20 0045  HGB 9.2* 8.9*  --   HCT 27.7* 28.2*  --   PLT 336 303  --   LABPROT 19.6* 20.2* 16.8*  INR 1.7* 1.8* 1.4*  HEPARINUNFRC 0.20* 0.27* 0.22*  CREATININE 0.78 0.76 0.83    Estimated Creatinine Clearance: 100.2 mL/min (by C-G formula based on SCr of 0.83 mg/dL).   Medical History: Past Medical History:  Diagnosis Date  . Dilated cardiomyopathy (HCC)   . Dilated cardiomyopathy (HCC) 02/2019  . Polysubstance abuse (HCC)     Assessment: 24 yom presenting with concern for driveline infection - hx HM3 implanted on 02/26/19 - on warfarin PTA. PTA regimen is 2.5 mg daily. He is now s/p dental extractions on 3/31.  INR down to 1.4  today after holding Coumadin over the weekend He has required much higher doses inpatient than PTA given improved nutrition > will resume warfarin at 5mg  daily and keep INR < 2 for possible debridement of DLI Heparin drip 900 uts/hr HL 0.2 at goal   Bleeding from site where abscess was lanced, now improved.  CBC fairly stable.   Goal of Therapy:  Heparin level ~0.3 INR 2-2.5 Monitor platelets by anticoagulation protocol: Yes   Plan:  Continue IV heparin at 900 units/hr. . Warfarin 5mg  daily  AM CBC, protime and heparin level.   4/31 Pharm.D. CPP, BCPS Clinical Pharmacist [phone] 05/15/2020 1:33 PM      [facility] pharmacy phone numbers are listed on [email or web address]

## 2020-05-15 NOTE — H&P (View-Only) (Signed)
[address]       [name] [address]             [phone]      Cardiothoracic Surgery Consultation  Reason for Consult: HM lll LVAD drive line infection Referring Physician: Dr. Lilian Kapur Rog is an 35 y.o. male.  HPI:   The patient is a 35 year old gentleman with a history of severe systolic heart failure due to nonischemic cardiomyopathy who underwent HeartMate 3 LVAD insertion by Dr. Maren Beach on 02/26/2019.  He underwent extractions of 2 retained roots by Dr. Chales Salmon on 05/04/2020.  He has recovered from this without complication.  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  He was noted to have malodorous drainage from the driveline exit site with no tunneling.  His blood culture on admission was negative.  Driveline culture grew rare staph aureus.  He was seen by infectious disease and his antibiotics were narrowed to cefazolin.  CT of the abdomen and pelvis on 05/01/2020 showed subcutaneous soft tissue edema surrounding the driveline along the left anterior abdomen with no associated fluid collection.  He subsequently developed further swelling and a small pustule in the abdomen that was I&D at the bedside on 05/09/2020.  This area opened up further and was communicating with the driveline.  The wound was packed with iodoform gauze starting on 05/12/2020.  A follow-up CT of the abdomen and pelvis on 05/12/2020 showed a focal gas and fluid collection adjacent to the driveline left upper abdominal wall consistent with cellulitis and subcutaneous abscess.  This did not appear to track medially.  Subsequent cultures have grown staph aureus sensitive to oxacillin.  He has remained afebrile with a normal white blood cell count  Past Medical History:  Diagnosis Date  . Dilated cardiomyopathy (HCC)   . Dilated cardiomyopathy (HCC) 02/2019  . Polysubstance abuse [facility])      Past Surgical History:  Procedure Laterality Date  . INSERTION OF IMPLANTABLE LEFT VENTRICULAR ASSIST DEVICE N/A 02/26/2019   Procedure: INSERTION OF IMPLANTABLE LEFT VENTRICULAR ASSIST DEVICE;  Surgeon: Kerin Perna, MD;  Location: [facility] OR;  Service: Open Heart Surgery;  Laterality: N/A;  HM3  . IR THORACENTESIS ASP PLEURAL SPACE W/IMG GUIDE  03/10/2019  . MULTIPLE EXTRACTIONS WITH ALVEOLOPLASTY N/A 05/04/2020   Procedure: MULTIPLE EXTRACTION TEETH FIVE AND FIFTEEN WITH ALVEOLOPLASTY;  Surgeon: Sharman Cheek, DMD;  Location: [facility] OR;  Service: Dentistry;  Laterality: N/A;  . RIGHT HEART CATH N/A 02/25/2019   Procedure: RIGHT HEART CATH;  Surgeon: Dolores Patty, MD;  Location: [facility] INVASIVE CV LAB;  Service: Cardiovascular;  Laterality: N/A;  . RIGHT/LEFT HEART CATH AND CORONARY ANGIOGRAPHY N/A 02/11/2019   Procedure: RIGHT/LEFT HEART CATH AND CORONARY ANGIOGRAPHY;  Surgeon: Dolores Patty, MD;  Location: [facility] INVASIVE CV LAB;  Service: Cardiovascular;  Laterality: N/A;  . TEE WITHOUT CARDIOVERSION N/A 02/26/2019   Procedure: TRANSESOPHAGEAL ECHOCARDIOGRAM (TEE);  Surgeon: Donata Clay, Theron Arista, MD;  Location: [facility] OR;  Service: Open Heart Surgery;  Laterality: N/A;  . VIDEO BRONCHOSCOPY N/A 02/26/2019   Procedure: Video Bronchoscopy;  Surgeon: Kerin Perna, MD;  Location: [facility] OR;  Service: Open Heart Surgery;  Laterality: N/A;    Family History  Problem Relation Age of Onset  . CAD Father     Social History:  reports that he has quit smoking. His smoking use included  cigarettes. He has never used smokeless tobacco. He reports current alcohol use. He reports previous drug use.  Allergies: No Known Allergies  Medications:  I have reviewed the patient's current medications. Prior to Admission:  Medications Prior to Admission  Medication Sig Dispense Refill Last Dose  . pantoprazole (PROTONIX) 40 MG tablet Take 1 tablet (40 mg total) by mouth daily. 30 tablet 11 04/28/2020 at Unknown  time  . potassium chloride SA (KLOR-CON M20) 20 MEQ tablet Take 2 tablets (40 mEq total) by mouth daily. 60 tablet 4 04/28/2020 at Unknown time  . warfarin (COUMADIN) 5 MG tablet Take warfarin 2.5 mg (1/2 tablet) every day or as directed by HF Clinic. Only use NDC [address]-01. (Patient taking differently: Take 5 mg by mouth daily. Take warfarin 2.5 mg (1/2 tablet) every day or as directed by HF Clinic. Only use NDC [address]-01.) 30 tablet 11 04/27/2020 at 6 pm  . sildenafil (REVATIO) 20 MG tablet Take 1 tablet (20 mg total) by mouth 3 (three) times daily. (Patient not taking: Reported on 04/28/2020) 90 tablet 5 Not Taking at Unknown time   Scheduled: . carvedilol  6.25 mg Oral BID WC  . losartan  25 mg Oral Daily  . pantoprazole  40 mg Oral Daily  . sodium chloride flush  3 mL Intravenous Q12H  . Warfarin - Pharmacist Dosing Inpatient   Does not apply q1600   Continuous: . sodium chloride    .  ceFAZolin (ANCEF) IV 2 g (05/15/20 1325)  . heparin 900 Units/hr (05/15/20 1500)  . lactated ringers 10 mL/hr at 05/04/20 0921  . lactated ringers 10 mL/hr at 05/04/20 1136   PRN:sodium chloride, acetaminophen, ondansetron (ZOFRAN) IV Anti-infectives (From admission, onward)   Start     Dose/Rate Route Frequency Ordered Stop   05/12/20 1400  ceFAZolin (ANCEF) IVPB 2g/100 mL premix        2 g 200 mL/hr over 30 Minutes Intravenous Every 8 hours 05/12/20 0925     05/06/20 0800  Oritavancin Diphosphate (ORBACTIV) 1,200 mg in dextrose 5 % IVPB        1,200 mg 333.3 mL/hr over 180 Minutes Intravenous Once 05/05/20 0930 05/06/20 1411   04/30/20 1700  ceFAZolin (ANCEF) IVPB 2g/100 mL premix        2 g 200 mL/hr over 30 Minutes Intravenous Every 8 hours 04/30/20 1610 05/05/20 1653   04/29/20 0800  vancomycin (VANCOREADY) IVPB 750 mg/150 mL  Status:  Discontinued        750 mg 150 mL/hr over 60 Minutes Intravenous Every 12 hours 04/28/20 1714 04/30/20 1610   04/28/20 1900  vancomycin (VANCOREADY) IVPB  1000 mg/200 mL        1,000 mg 200 mL/hr over 60 Minutes Intravenous  Once 04/28/20 1714 04/28/20 2114   04/28/20 1900  metroNIDAZOLE (FLAGYL) tablet 500 mg  Status:  Discontinued        500 mg Oral Every 8 hours 04/28/20 1741 04/30/20 1610   04/28/20 1800  ceFEPIme (MAXIPIME) 2 g in sodium chloride 0.9 % 100 mL IVPB  Status:  Discontinued        2 g 200 mL/hr over 30 Minutes Intravenous Every 8 hours 04/28/20 1714 04/30/20 1610      Results for orders placed or performed during the hospital encounter of 04/28/20 (from the past 48 hour(s))  Lactate dehydrogenase     Status: Abnormal   Collection Time: 05/14/20 12:22 AM  Result Value Ref Range   LDH 197 (H) 98 -   192 U/L    Comment: Performed at [facility] Lab, [address], [place], [place] [address]  Protime-INR     Status: Abnormal   Collection Time: 05/14/20 12:22 AM  Result Value Ref Range   Prothrombin Time 20.2 (H) 11.4 - 15.2 seconds   INR 1.8 (H) 0.8 - 1.2    Comment: (NOTE) INR goal varies based on device and disease states. Performed at [facility], [address], [place], [place] [address]   Magnesium     Status: None   Collection Time: 05/14/20 12:22 AM  Result Value Ref Range   Magnesium 1.8 1.7 - 2.4 mg/dL    Comment: Performed at [facility], [address], [place], [place] [address]  CBC     Status: Abnormal   Collection Time: 05/14/20 12:22 AM  Result Value Ref Range   WBC 6.5 4.0 - 10.5 K/uL   RBC 2.90 (L) 4.22 - 5.81 MIL/uL   Hemoglobin 8.9 (L) 13.0 - 17.0 g/dL   HCT 07.6 (L) 22.6 - 33.3 %   MCV 97.2 80.0 - 100.0 fL   MCH 30.7 26.0 - 34.0 pg   MCHC 31.6 30.0 - 36.0 g/dL   RDW 54.5 (H) 62.5 - 63.8 %   Platelets 303 150 - 400 K/uL   nRBC 0.0 0.0 - 0.2 %    Comment: Performed at [facility], [address], [place], [place] [address]  Heparin level (unfractionated)     Status: Abnormal   Collection Time: 05/14/20 12:22 AM  Result Value Ref Range   Heparin Unfractionated 0.27  (L) 0.30 - 0.70 IU/mL    Comment: (NOTE) If heparin results are below expected values, and patient dosage has  been confirmed, suggest follow up testing of antithrombin III levels. Performed at [facility], [address] [place], [place] [address]   Phosphorus     Status: None   Collection Time: 05/14/20 12:22 AM  Result Value Ref Range   Phosphorus 3.7 2.5 - 4.6 mg/dL    Comment: Performed at [facility], [address] [place], [place] [address]  Prealbumin     Status: None   Collection Time: 05/14/20 12:22 AM  Result Value Ref Range   Prealbumin 19.4 18 - 38 mg/dL    Comment: Performed at [facility], [address] [place], [place] [address]  Comprehensive metabolic panel     Status: Abnormal   Collection Time: 05/14/20 12:22 AM  Result Value Ref Range   Sodium 137 135 - 145 mmol/L   Potassium 3.6 3.5 - 5.1 mmol/L   Chloride 103 98 - 111 mmol/L   CO2 26 22 - 32 mmol/L   Glucose, Bld 92 70 - 99 mg/dL    Comment: Glucose reference range applies only to samples taken after fasting for at least 8 hours.   BUN 6 6 - 20 mg/dL   Creatinine, Ser 5.97 0.61 - 1.24 mg/dL   Calcium 9.2 8.9 - 41.6 mg/dL   Total Protein 6.9 6.5 - 8.1 g/dL   Albumin 3.4 (L) 3.5 - 5.0 g/dL   AST 54 (H) 15 - 41 U/L   ALT 39 0 - 44 U/L   Alkaline Phosphatase 116 38 - 126 U/L   Total Bilirubin 0.6 0.3 - 1.2 mg/dL   GFR, Estimated >38 >45 mL/min    Comment: (NOTE) Calculated using the CKD-EPI Creatinine Equation (2021)    Anion gap 8  5 - 15    Comment: Performed at [facility], [address], [place], [place] [address]  Lactate dehydrogenase     Status: Abnormal   Collection Time: 05/15/20 12:45 AM  Result Value Ref Range   LDH 212 (H) 98 - 192 U/L    Comment: Performed at [facility], [address], [place], [place] [address]  Protime-INR     Status: Abnormal   Collection Time: 05/15/20 12:45 AM  Result Value Ref Range   Prothrombin Time 16.8 (H) 11.4 - 15.2  seconds   INR 1.4 (H) 0.8 - 1.2    Comment: (NOTE) INR goal varies based on device and disease states. Performed at [facility], [address] [place], [place] [address]   Magnesium     Status: None   Collection Time: 05/15/20 12:45 AM  Result Value Ref Range   Magnesium 1.7 1.7 - 2.4 mg/dL    Comment: Performed at [facility], [address] [place], [place] [address]  Basic metabolic panel     Status: Abnormal   Collection Time: 05/15/20 12:45 AM  Result Value Ref Range   Sodium 137 135 - 145 mmol/L   Potassium 3.7 3.5 - 5.1 mmol/L   Chloride 104 98 - 111 mmol/L   CO2 25 22 - 32 mmol/L   Glucose, Bld 104 (H) 70 - 99 mg/dL    Comment: Glucose reference range applies only to samples taken after fasting for at least 8 hours.   BUN 6 6 - 20 mg/dL   Creatinine, Ser 6.19 0.61 - 1.24 mg/dL   Calcium 9.5 8.9 - 50.9 mg/dL   GFR, Estimated >32 >67 mL/min    Comment: (NOTE) Calculated using the CKD-EPI Creatinine Equation (2021)    Anion gap 8 5 - 15    Comment: Performed at [facility], [address] [place], [place] [address]  Heparin level (unfractionated)     Status: Abnormal   Collection Time: 05/15/20 12:45 AM  Result Value Ref Range   Heparin Unfractionated 0.22 (L) 0.30 - 0.70 IU/mL    Comment: (NOTE) If heparin results are below expected values, and patient dosage has  been confirmed, suggest follow up testing of antithrombin III levels. Performed at [facility], [address] [place], [place] [address]     No results found.  Review of Systems  All other systems reviewed and are negative.  Blood pressure 95/82, pulse (!) 101, temperature 98.6 F (37 C), resp. rate 17, height 5\' 5"  (1.651 m), weight 56.5 kg, SpO2 98 %. Physical Exam Constitutional:      Appearance: Normal appearance. He is normal weight.  Cardiovascular:     Heart sounds: No murmur heard.     Comments: Distant heart sounds with LVAD hum present Pulmonary:      Effort: Pulmonary effort is normal.     Breath sounds: Normal breath sounds.  Abdominal:     General: Abdomen is flat.     Palpations: Abdomen is soft.     Comments: Abdominal wound dressing in place and was just changed.  I did not remove it.  Neurological:     Mental Status: He is alert.     Assessment/Plan:  He has an abscess over the driveline tunnel in the left abdominal subcutaneous tissue which has spontaneously drained to the surface.  I think the best course of action is to evaluate this under anesthesia to make sure  there is complete drainage of the abscess.  He will likely require further debridement and wound VAC therapy and prolonged antibiotics.  I discussed the operative procedure with the patient including alternatives, benefits, and risks including the possibility that he may require long-term antibiotics and changing the wound VAC every few days.  Unfortunately does not live locally.  He understands and is in agreement to proceed.  I will plan to do this tomorrow afternoon.  Alleen Borne 05/15/2020, 5:16 PM

## 2020-05-15 NOTE — Progress Notes (Signed)
Verified with pharmacist if we have to give coumadin 5 mg po tonight since pt is going for I and D tom. Claimed to give it.

## 2020-05-16 ENCOUNTER — Inpatient Hospital Stay (HOSPITAL_COMMUNITY): Payer: Medicaid - Out of State | Admitting: Anesthesiology

## 2020-05-16 ENCOUNTER — Encounter (HOSPITAL_COMMUNITY): Payer: Self-pay | Admitting: Internal Medicine

## 2020-05-16 ENCOUNTER — Encounter (HOSPITAL_COMMUNITY)
Admission: AD | Disposition: A | Discharge status: Skilled Nursing Facility | Payer: Self-pay | Source: Ambulatory Visit | Attending: Internal Medicine

## 2020-05-16 DIAGNOSIS — I5022 Chronic systolic (congestive) heart failure: Secondary | ICD-10-CM | POA: Diagnosis not present

## 2020-05-16 DIAGNOSIS — Z95811 Presence of heart assist device: Secondary | ICD-10-CM | POA: Diagnosis not present

## 2020-05-16 DIAGNOSIS — T827XXA Infection and inflammatory reaction due to other cardiac and vascular devices, implants and grafts, initial encounter: Secondary | ICD-10-CM | POA: Diagnosis not present

## 2020-05-16 HISTORY — PX: APPLICATION OF WOUND VAC: SHX5189

## 2020-05-16 HISTORY — PX: INCISION AND DRAINAGE OF WOUND: SHX1803

## 2020-05-16 LAB — CBC
HCT: 29.3 % — ABNORMAL LOW (ref 39.0–52.0)
Hemoglobin: 9.3 g/dL — ABNORMAL LOW (ref 13.0–17.0)
MCH: 30.4 pg (ref 26.0–34.0)
MCHC: 31.7 g/dL (ref 30.0–36.0)
MCV: 95.8 fL (ref 80.0–100.0)
Platelets: 265 10*3/uL (ref 150–400)
RBC: 3.06 MIL/uL — ABNORMAL LOW (ref 4.22–5.81)
RDW: 21 % — ABNORMAL HIGH (ref 11.5–15.5)
WBC: 8.9 10*3/uL (ref 4.0–10.5)
nRBC: 0 % (ref 0.0–0.2)

## 2020-05-16 LAB — PROTIME-INR
INR: 1.1 (ref 0.8–1.2)
Prothrombin Time: 14.2 seconds (ref 11.4–15.2)

## 2020-05-16 LAB — BASIC METABOLIC PANEL
Anion gap: 11 (ref 5–15)
BUN: 6 mg/dL (ref 6–20)
CO2: 23 mmol/L (ref 22–32)
Calcium: 9.4 mg/dL (ref 8.9–10.3)
Chloride: 102 mmol/L (ref 98–111)
Creatinine, Ser: 0.79 mg/dL (ref 0.61–1.24)
GFR, Estimated: >60 mL/min (ref >60)
Glucose, Bld: 107 mg/dL — ABNORMAL HIGH (ref 70–99)
Potassium: 4 mmol/L (ref 3.5–5.1)
Sodium: 136 mmol/L (ref 135–145)

## 2020-05-16 LAB — SURGICAL PCR SCREEN
MRSA, PCR: NEGATIVE
Staphylococcus aureus: NEGATIVE

## 2020-05-16 LAB — HEPARIN LEVEL (UNFRACTIONATED): Heparin Unfractionated: 0.23 IU/mL — ABNORMAL LOW (ref 0.30–0.70)

## 2020-05-16 LAB — LACTATE DEHYDROGENASE: LDH: 220 U/L — ABNORMAL HIGH (ref 98–192)

## 2020-05-16 LAB — MAGNESIUM: Magnesium: 1.9 mg/dL (ref 1.7–2.4)

## 2020-05-16 SURGERY — IRRIGATION AND DEBRIDEMENT WOUND
Anesthesia: General | Site: Abdomen | Laterality: Left

## 2020-05-16 MED ORDER — WARFARIN SODIUM 5 MG PO TABS
5.0000 mg | ORAL_TABLET | Freq: Every day | ORAL | Status: DC
  Administered 2020-05-16 – 2020-05-17 (×2): 5 mg via ORAL
  Filled 2020-05-16 (×2): qty 1

## 2020-05-16 MED ORDER — NOREPINEPHRINE 4 MG/250ML-% IV SOLN
0.0000 ug/min | INTRAVENOUS | Status: AC
  Administered 2020-05-16: 1 ug/min via INTRAVENOUS
  Filled 2020-05-16: qty 250

## 2020-05-16 MED ORDER — ETOMIDATE 2 MG/ML IV SOLN
INTRAVENOUS | Status: DC | PRN
  Administered 2020-05-16: 8 mg via INTRAVENOUS

## 2020-05-16 MED ORDER — LACTATED RINGERS IV SOLN: INTRAVENOUS | Status: DC

## 2020-05-16 MED ORDER — FENTANYL CITRATE (PF) 250 MCG/5ML IJ SOLN
INTRAMUSCULAR | Status: AC
  Filled 2020-05-16: qty 5

## 2020-05-16 MED ORDER — FENTANYL CITRATE (PF) 100 MCG/2ML IJ SOLN
INTRAMUSCULAR | Status: AC
  Filled 2020-05-16: qty 2

## 2020-05-16 MED ORDER — MIDAZOLAM HCL 2 MG/2ML IJ SOLN
INTRAMUSCULAR | Status: AC
  Filled 2020-05-16: qty 2

## 2020-05-16 MED ORDER — MIDAZOLAM HCL 2 MG/2ML IJ SOLN
INTRAMUSCULAR | Status: DC | PRN
  Administered 2020-05-16: 2 mg via INTRAVENOUS

## 2020-05-16 MED ORDER — ONDANSETRON HCL 4 MG/2ML IJ SOLN
INTRAMUSCULAR | Status: DC | PRN
  Administered 2020-05-16: 4 mg via INTRAVENOUS

## 2020-05-16 MED ORDER — CHLORHEXIDINE GLUCONATE 0.12 % MT SOLN
OROMUCOSAL | Status: AC
  Administered 2020-05-16: 15 mL via OROMUCOSAL
  Filled 2020-05-16: qty 15

## 2020-05-16 MED ORDER — HEPARIN (PORCINE) 25000 UT/250ML-% IV SOLN
900.0000 [IU]/h | INTRAVENOUS | Status: DC
  Administered 2020-05-16: 900 [IU]/h via INTRAVENOUS
  Administered 2020-05-18: 950 [IU]/h via INTRAVENOUS
  Administered 2020-05-19 – 2020-05-22 (×4): 1000 [IU]/h via INTRAVENOUS
  Filled 2020-05-16 (×9): qty 250

## 2020-05-16 MED ORDER — 0.9 % SODIUM CHLORIDE (POUR BTL) OPTIME
TOPICAL | Status: DC | PRN
  Administered 2020-05-16: 1000 mL

## 2020-05-16 MED ORDER — MEPERIDINE HCL 25 MG/ML IJ SOLN: 6.2500 mg | INTRAMUSCULAR | Status: DC | PRN

## 2020-05-16 MED ORDER — PHENYLEPHRINE 40 MCG/ML (10ML) SYRINGE FOR IV PUSH (FOR BLOOD PRESSURE SUPPORT)
PREFILLED_SYRINGE | INTRAVENOUS | Status: DC | PRN
  Administered 2020-05-16: 40 ug via INTRAVENOUS

## 2020-05-16 MED ORDER — CHLORHEXIDINE GLUCONATE 0.12 % MT SOLN: 15.0000 mL | Freq: Once | OROMUCOSAL | Status: AC

## 2020-05-16 MED ORDER — ROCURONIUM BROMIDE 10 MG/ML (PF) SYRINGE
PREFILLED_SYRINGE | INTRAVENOUS | Status: DC | PRN
  Administered 2020-05-16: 20 mg via INTRAVENOUS
  Administered 2020-05-16: 40 mg via INTRAVENOUS

## 2020-05-16 MED ORDER — ORAL CARE MOUTH RINSE: 15.0000 mL | Freq: Once | OROMUCOSAL | Status: AC

## 2020-05-16 MED ORDER — SUGAMMADEX SODIUM 200 MG/2ML IV SOLN
INTRAVENOUS | Status: DC | PRN
  Administered 2020-05-16: 200 mg via INTRAVENOUS

## 2020-05-16 MED ORDER — PROPOFOL 10 MG/ML IV BOLUS
INTRAVENOUS | Status: AC
  Filled 2020-05-16: qty 20

## 2020-05-16 MED ORDER — FENTANYL CITRATE (PF) 100 MCG/2ML IJ SOLN
25.0000 ug | INTRAMUSCULAR | Status: DC | PRN
  Administered 2020-05-16: 25 ug via INTRAVENOUS

## 2020-05-16 MED ORDER — FENTANYL CITRATE (PF) 250 MCG/5ML IJ SOLN
INTRAMUSCULAR | Status: DC | PRN
  Administered 2020-05-16 (×2): 50 ug via INTRAVENOUS

## 2020-05-16 MED ORDER — MAGNESIUM SULFATE 2 GM/50ML IV SOLN
2.0000 g | Freq: Once | INTRAVENOUS | Status: AC
  Administered 2020-05-16: 2 g via INTRAVENOUS
  Filled 2020-05-16: qty 50

## 2020-05-16 MED ORDER — ACETAMINOPHEN 325 MG PO TABS: 325.0000 mg | ORAL_TABLET | Freq: Once | ORAL | Status: DC | PRN

## 2020-05-16 MED ORDER — ACETAMINOPHEN 10 MG/ML IV SOLN: 1000.0000 mg | Freq: Once | INTRAVENOUS | Status: DC | PRN

## 2020-05-16 MED ORDER — LIDOCAINE 2% (20 MG/ML) 5 ML SYRINGE
INTRAMUSCULAR | Status: DC | PRN
  Administered 2020-05-16: 40 mg via INTRAVENOUS

## 2020-05-16 MED ORDER — ALBUMIN HUMAN 5 % IV SOLN: INTRAVENOUS | Status: DC | PRN

## 2020-05-16 MED ORDER — ACETAMINOPHEN 160 MG/5ML PO SOLN: 325.0000 mg | Freq: Once | ORAL | Status: DC | PRN

## 2020-05-16 SURGICAL SUPPLY — 32 items
BAG DECANTER FOR FLEXI CONT (MISCELLANEOUS) IMPLANT
BLADE CLIPPER SURG (BLADE) ×2 IMPLANT
CANISTER SUCT 3000ML PPV (MISCELLANEOUS) ×2 IMPLANT
CANISTER WOUND CARE 500ML ATS (WOUND CARE) ×1 IMPLANT
COVER SURGICAL LIGHT HANDLE (MISCELLANEOUS) ×2 IMPLANT
DRAPE LAPAROSCOPIC ABDOMINAL (DRAPES) ×2 IMPLANT
DRSG VAC ATS SM SENSATRAC (GAUZE/BANDAGES/DRESSINGS) ×1 IMPLANT
ELECT REM PT RETURN 9FT ADLT (ELECTROSURGICAL) ×2
ELECTRODE REM PT RTRN 9FT ADLT (ELECTROSURGICAL) ×1 IMPLANT
GAUZE SPONGE 4X4 12PLY STRL (GAUZE/BANDAGES/DRESSINGS) ×2 IMPLANT
GLOVE SURG MICRO LTX SZ7 (GLOVE) ×2 IMPLANT
GOWN STRL REUS W/ TWL LRG LVL3 (GOWN DISPOSABLE) ×2 IMPLANT
GOWN STRL REUS W/ TWL XL LVL3 (GOWN DISPOSABLE) ×1 IMPLANT
GOWN STRL REUS W/TWL LRG LVL3 (GOWN DISPOSABLE) ×2
GOWN STRL REUS W/TWL XL LVL3 (GOWN DISPOSABLE) ×1
KIT BASIN OR (CUSTOM PROCEDURE TRAY) ×2 IMPLANT
KIT TURNOVER KIT B (KITS) ×2 IMPLANT
NS IRRIG 1000ML POUR BTL (IV SOLUTION) ×2 IMPLANT
PACK GENERAL/GYN (CUSTOM PROCEDURE TRAY) ×2 IMPLANT
PAD ARMBOARD 7.5X6 YLW CONV (MISCELLANEOUS) ×4 IMPLANT
SPONGE LAP 18X18 RF (DISPOSABLE) IMPLANT
SUT SILK 1 TIES 10X30 (SUTURE) ×2 IMPLANT
SUT SILK 2 0 (SUTURE) ×1
SUT SILK 2-0 18XBRD TIE 12 (SUTURE) ×1 IMPLANT
SUT VIC AB 2-0 CT1 27 (SUTURE)
SUT VIC AB 2-0 CT1 TAPERPNT 27 (SUTURE) IMPLANT
SUT VIC AB 3-0 X1 27 (SUTURE) IMPLANT
SWAB COLLECTION DEVICE MRSA (MISCELLANEOUS) IMPLANT
SWAB CULTURE ESWAB REG 1ML (MISCELLANEOUS) IMPLANT
TOWEL GREEN STERILE (TOWEL DISPOSABLE) ×2 IMPLANT
TOWEL GREEN STERILE FF (TOWEL DISPOSABLE) ×2 IMPLANT
WATER STERILE IRR 1000ML POUR (IV SOLUTION) ×2 IMPLANT

## 2020-05-16 NOTE — Progress Notes (Signed)
Back  from PACU by bed awake and alert. 

## 2020-05-16 NOTE — Progress Notes (Signed)
ANTICOAGULATION CONSULT NOTE  Pharmacy Consult for heparin Indication: LVAD  No Known Allergies  Patient Measurements: Height: 5\' 5"  (165.1 cm) Weight: 56 kg (123 lb 7.3 oz) IBW/kg (Calculated) : 61.5  Heparin dosing weight: 55.2 kg  Vital Signs: Temp: 97.9 F (36.6 C) (04/12 1336) Temp Source: Oral (04/12 0759) BP: 97/83 (04/12 1336) Pulse Rate: 79 (04/12 1336)  Labs: Recent Labs    05/14/20 0022 05/15/20 0045 05/16/20 0112  HGB 8.9*  --  9.3*  HCT 28.2*  --  29.3*  PLT 303  --  265  LABPROT 20.2* 16.8* 14.2  INR 1.8* 1.4* 1.1  HEPARINUNFRC 0.27* 0.22* 0.23*  CREATININE 0.76 0.83 0.79    Estimated Creatinine Clearance: 103.1 mL/min (by C-G formula based on SCr of 0.79 mg/dL).   Medical History: Past Medical History:  Diagnosis Date  . Dilated cardiomyopathy (HCC)   . Dilated cardiomyopathy (HCC) 02/2019  . Polysubstance abuse (HCC)     Assessment: 52 yom presenting with concern for driveline infection - hx HM3 implanted on 02/26/19 - on warfarin PTA. PTA regimen is 2.5 mg daily. He is now s/p dental extractions on 3/31.  INR down to 1.1  today after holding Coumadin over the weekend He has required much higher doses inpatient than PTA given improved nutrition > will resume warfarin at 5mg  daily and keep INR < 2 for possible debridement of DLI Heparin drip 900 uts/hr HL 0.22 at goal   Bleeding from site where abscess was lanced, now improved.  CBC fairly stable. Heparin to be turned off for OR today will follow up after for anticoagulation plans  Goal of Therapy:  Heparin level ~0.3 INR 2-2.5 Monitor platelets by anticoagulation protocol: Yes   Plan:  Continue IV heparin at 900 units/hr - turn off on call to OR will follow up after Warfarin 5mg  daily  AM CBC, protime and heparin level.   4/31 Pharm.D. CPP, BCPS Clinical Pharmacist [phone] 05/16/2020 2:16 PM      [facility] pharmacy phone numbers are listed on  [email or web address]

## 2020-05-16 NOTE — Progress Notes (Signed)
Heparin gtt stopped  On call for surgery.

## 2020-05-16 NOTE — Progress Notes (Signed)
Transported to short stay by bed , stable, report given to short stay Rn, L VAD coordinator made aware.

## 2020-05-16 NOTE — Op Note (Addendum)
  CARDIOVASCULAR SURGERY OPERATIVE NOTE  05/16/2020  Surgeon:  Alleen Borne, MD  First Assistant: none   Preoperative Diagnosis:  HeartMate lll driveline infection   Postoperative Diagnosis:  Same   Procedure:  1. Incision and debridement of drive line infection. 2.  Application of Wound Vac (negative pressure wound therapy)  Anesthesia:  General Endotracheal   Clinical History/Surgical Indication:  The patient is a 35 year old gentleman with a history of severe systolic heart failure due to nonischemic cardiomyopathy who underwent HeartMate 3 LVAD insertion by Dr. Maren Beach on 02/26/2019. He has an abscess over the driveline tunnel in the left abdominal subcutaneous tissue which has spontaneously drained to the surface.  I think the best course of action is to evaluate this under anesthesia to make sure there is complete drainage of the abscess.  He will likely require further debridement and wound VAC therapy and prolonged antibiotics.  I discussed the operative procedure with the patient including alternatives, benefits, and risks including the possibility that he may require long-term antibiotics and changing the wound VAC every few days. He understands and is in agreement to proceed.   Preparation:  The patient was seen in the preoperative holding area and the correct patient, correct operation were confirmed with the patient after reviewing the medical record and catheterization. The consent was signed by me. Preoperative antibiotics were given. The patient was taken back to the operating room and positioned supine on the operating room table. After being placed under general endotracheal anesthesia by the anesthesia team a foley catheter was placed. The  Abdomen was prepped with chloroprep and draped in the usual sterile manner. A surgical time-out was taken and the correct patient and  operative procedure were confirmed with the nursing and anesthesia staff.   Incision and debridement:  The small open wound over the drive line tunnel from bedside I &D was probed and the drive line was lying at the depth of the wound. A four centimeter incision was made over the area and carried down to the drive line tunnel taking care not to injure the drive line. There was an abscess cavity around the drive line at this location but minimal residual purulence. Cultures were taken. Since the drive line exit site looked good I did not carry the incision distally to the exit site in order to preserve this. The last 1 cm of velour was visible in the abscess cavity. The remainder of the velour appeared well adherent to the surrounding tissue and there was no tracking along the drive line proximal to the abscess cavity. I used a curette to debride the abscess cavity to viable appearing tissue. Hemostasis was achieved. A small piece of black VAC sponge was placed into the cavity around the drive line. The plastic dressing was applied and it was connected to suction. There was no air leak. A new dressing was applied around the exit site. The sponge, needle and instrument counts were correct. The patient was awakened and transported to the PACU in stable condition with the VAD coordinator present.

## 2020-05-16 NOTE — Anesthesia Preprocedure Evaluation (Addendum)
Anesthesia Evaluation  Patient identified by MRN, date of birth, ID band Patient awake    Reviewed: Allergy & Precautions, NPO status , Patient's Chart, lab work & pertinent test results  Airway Mallampati: II  TM Distance: >3 FB Neck ROM: Full    Dental  (+) Dental Advisory Given, Poor Dentition, Missing, Chipped,    Pulmonary former smoker,    breath sounds clear to auscultation       Cardiovascular +CHF   Rhythm:Regular Rate:Normal     Neuro/Psych negative neurological ROS  negative psych ROS   GI/Hepatic negative GI ROS, Neg liver ROS,   Endo/Other  negative endocrine ROS  Renal/GU      Musculoskeletal negative musculoskeletal ROS (+)   Abdominal Normal abdominal exam  (+)   Peds  Hematology negative hematology ROS (+)   Anesthesia Other Findings   Reproductive/Obstetrics                           Anesthesia Physical Anesthesia Plan  ASA: IV  Anesthesia Plan: General   Post-op Pain Management:    Induction: Intravenous  PONV Risk Score and Plan: 3 and Ondansetron and Midazolam  Airway Management Planned: Oral ETT  Additional Equipment:   Intra-op Plan:   Post-operative Plan: Extubation in OR  Informed Consent: I have reviewed the patients History and Physical, chart, labs and discussed the procedure including the risks, benefits and alternatives for the proposed anesthesia with the patient or authorized representative who has indicated his/her understanding and acceptance.     Dental advisory given  Plan Discussed with: CRNA  Anesthesia Plan Comments:        Anesthesia Quick Evaluation

## 2020-05-16 NOTE — Progress Notes (Signed)
[facility]    Date of Admission:  04/28/2020   Total days of antibiotics            ID: Jon Martin is a 35 y.o. male with NICM with HM3 LVAD insertion Jan 2021 now developed abscess over the driveline tunnel in left subcutaneous tissue. Previously received IV abtx plus 1 dose of oritavancin but still had undrained abscess requiring I x D on 4/12 Principal Problem:   Infection associated with driveline of left ventricular assist device (LVAD) (HCC) Active Problems:   Chronic systolic heart failure (HCC)   Left ventricular assist device (LVAD) complication   Complication involving left ventricular assist device (LVAD)   Dental caries   Retained dental root   Chronic periodontitis   Loss of teeth due to extraction    Subjective: Underwent debridement -- and wound vac placement   3/25- 4/1: cefazolin 4/2: oritavancin 4/8- 4/12: cefazolin  Medications:  . carvedilol  6.25 mg Oral BID WC  . fentaNYL      . losartan  25 mg Oral Daily  . pantoprazole  40 mg Oral Daily  . sodium chloride flush  3 mL Intravenous Q12H  . warfarin  5 mg Oral q1600  . Warfarin - Pharmacist Dosing Inpatient   Does not apply q1600    Objective: Vital signs in last 24 hours: Temp:  [97.9 F (36.6 C)-98.8 F (37.1 C)] 97.9 F (36.6 C) (04/12 1336) Pulse Rate:  [78-101] 79 (04/12 1336) Resp:  [13-18] 13 (04/12 1336) BP: (89-104)/(52-86) 97/83 (04/12 1336) SpO2:  [96 %-100 %] 100 % (04/12 1336) Weight:  [56 kg] 56 kg (04/12 0300)  Physical Exam  Constitutional: He is oriented to person, place, and time. He appears well-developed and well-nourished. No distress.  HENT:  Mouth/Throat: Oropharynx is clear and moist. No oropharyngeal exudate.  Cardiovascular: generator hum Pulmonary/Chest: Effort normal and breath sounds normal. No respiratory distress. He has no wheezes.  Abdominal: Soft. Bowel sounds are normal. He exhibits no distension. There is no tenderness.  Wound vac in place Lymphadenopathy:  He has no cervical adenopathy.  Neurological: He is alert and oriented to person, place, and time.  Skin: Skin is warm and dry. No rash noted. No erythema.  Psychiatric: He has a normal mood and affect. His behavior is normal.    Lab Results Recent Labs    05/14/20 0022 05/15/20 0045 05/16/20 0112  WBC 6.5  --  8.9  HGB 8.9*  --  9.3*  HCT 28.2*  --  29.3*  NA 137 137 136  K 3.6 3.7 4.0  CL 103 104 102  CO2 26 25 23   BUN 6 6 6   CREATININE 0.76 0.83 0.79   Liver Panel Recent Labs    05/14/20 0022  PROT 6.9  ALBUMIN 3.4*  AST 54*  ALT 39  ALKPHOS 116  BILITOT 0.6    Microbiology: Staphylococcus aureus      MIC    CIPROFLOXACIN 1 SENSITIVE  Sensitive    CLINDAMYCIN RESISTANT  Resistant    ERYTHROMYCIN >=8 RESISTANT  Resistant    GENTAMICIN <=0.5 SENSI... Sensitive    Inducible Clindamycin POSITIVE  Resistant    OXACILLIN <=0.25 SENS... Sensitive    RIFAMPIN <=0.5 SENSI... Sensitive    TETRACYCLINE <=1 SENSITIVE  Sensitive    TRIMETH/SULFA <=10 SENSIT... Sensitive    VANCOMYCIN <=0.5 SENSI... Sensitive     Studies/Results: No results found.   Assessment/Plan:  MSSA LVAD driveline infection =  would like to treat for a minimum of 4 wk of IV using 4/12 as day 1 then will see in follow up to decide if need to extend further depending on how it is healing ,then convert to oral suppression with cephalexin.    [facility] Cell: [phone] Pager: [phone]  05/16/2020, 2:55 PM

## 2020-05-16 NOTE — Progress Notes (Signed)
Patient ID: Jon Martin, male   DOB: [birth year], 35 y.o.   MRN: [id number]   Advanced Heart Failure VAD Team Note  PCP-Cardiologist: No primary care provider on file.   Subjective:     HM3 LVAD admitted w/ Drive-line infection.  - Wound culture with S aureus. Blood cx NGTD - 3/26 abx narrowed to cefazolin 2gm IV Q 8hr. ID following  - 3/28 CT abd/pelvis soft tissue no deep infection. Oral Surgeon consulted.  - 3/31 s/p extractions of teeth #5 and 15  - 3/31 Low flow alarms last night, got 1 L IVF and low flows have stopped - 4/5 small pustule on abdomen noted>> performed superficial I&D at bedside, no communication with DL tunnel, cultures sent and pending  - 4/8 Abscess at driveline site opened . CT abdomen done, abscess adjacent to driveline noted.  Remains on heparin drip and IV abx. No further bleeding at DL site. No fevers or chills.   Denies SOB, orthopnea or PND. Wants to go home   LVAD INTERROGATION:   HeartMate 3 LVAD:   Flow 3.8 liters/min, speed 5400, power 4.0  PI 5.0 VAD interrogated personally. Parameters stable.    Objective:    Vital Signs:   Temp:  [98.5 F (36.9 C)-98.8 F (37.1 C)] 98.8 F (37.1 C) (04/12 0300) Pulse Rate:  [80-101] 80 (04/12 0300) Resp:  [15-17] 15 (04/12 0300) BP: (89-104)/(65-90) 104/75 (04/12 0300) SpO2:  [98 %-99 %] 99 % (04/12 0300) Weight:  [56 kg] 56 kg (04/12 0300) Last BM Date: 05/15/20   Mean arterial Pressure 70-80s  Intake/Output:   Intake/Output Summary (Last 24 hours) at 05/16/2020 0536 Last data filed at 05/16/2020 0400 Gross per 24 hour  Intake 898.05 ml  Output 2300 ml  Net -1401.95 ml     Physical Exam   General:  Thin young AAM, Sitting up in bed. No distress HEENT: normal  Neck: supple. JVP not elevated.  Carotids 2+ bilat; no bruits. No lymphadenopathy or thryomegaly appreciated. Cor: LVAD hum.  Lungs: Clear. Abdomen: obese soft, nontender, non-distended. No hepatosplenomegaly. No bruits or masses.  Good bowel sounds. Driveline site dressing C/D/I. Anchor in place.  Extremities: no cyanosis, clubbing, rash. Warm no edema  Neuro: alert & oriented x 3. No focal deficits. Moves all 4 without problem    Telemetry   Sinus 80s occasional PVCs.  Personally reviewed   Labs   Basic Metabolic Panel: Recent Labs  Lab 05/12/20 0030 05/13/20 0051 05/14/20 0022 05/15/20 0045 05/16/20 0112  NA 137 137 137 137 136  K 4.0 3.6 3.6 3.7 4.0  CL 104 103 103 104 102  CO2 25 26 26 25 23   GLUCOSE 99 96 92 104* 107*  BUN 6 6 6 6 6   CREATININE 0.74 0.78 0.76 0.83 0.79  CALCIUM 9.3 9.1 9.2 9.5 9.4  MG 2.0 1.7 1.8 1.7 1.9  PHOS  --   --  3.7  --   --     Liver Function Tests: Recent Labs  Lab 05/14/20 0022  AST 54*  ALT 39  ALKPHOS 116  BILITOT 0.6  PROT 6.9  ALBUMIN 3.4*   No results for input(s): LIPASE, AMYLASE in the last 168 hours. No results for input(s): AMMONIA in the last 168 hours.  CBC: Recent Labs  Lab 05/11/20 0115 05/12/20 0030 05/13/20 0051 05/14/20 0022 05/16/20 0112  WBC 7.9 8.2 8.3 6.5 8.9  HGB 9.3* 8.9* 9.2* 8.9* 9.3*  HCT 29.7* 28.5* 27.7* 28.2* 29.3*  MCV 97.7  96.0 96.9 97.2 95.8  PLT 355 343 336 303 265    INR: Recent Labs  Lab 05/12/20 0030 05/13/20 0051 05/14/20 0022 05/15/20 0045 05/16/20 0112  INR 1.7* 1.7* 1.8* 1.4* 1.1    Other results:    Imaging   No results found.   Medications:     Scheduled Medications: . carvedilol  6.25 mg Oral BID WC  . losartan  25 mg Oral Daily  . pantoprazole  40 mg Oral Daily  . sodium chloride flush  3 mL Intravenous Q12H  . Warfarin - Pharmacist Dosing Inpatient   Does not apply q1600    Infusions: . sodium chloride    .  ceFAZolin (ANCEF) IV Stopped (05/15/20 2225)  . heparin 900 Units/hr (05/16/20 0400)  . lactated ringers 10 mL/hr at 05/04/20 0921  . lactated ringers 10 mL/hr at 05/04/20 1136    PRN Medications: sodium chloride, acetaminophen, ondansetron (ZOFRAN)  IV   Patient Profile   36 y/o male w/ chronic systolic HF s/p HM3 LVAD admitted w/ staph aureus drive-line infection   Assessment/Plan:    1. 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 -  Admitted for IV abx vancomycin/cefepime/Flagyl - Driveline culture with S aureus. Blood cx NGTD  - 3/27 abx narrowed to cefazolin 2gm IV Q 8hr => stopped 3/31 and got ortivancin 4/1  - CT abd/pelvis- soft tissue infection. No deep infection - 4/5 small pustule on abdomen noted >> performed superficial I&D at bedside, no communication with DL tunnel, cultures growing GPC.  -4/8 now open area at medial aspect communicates with driveline. Increased induration noted. This area was packed with 1/2 inch packing strip.  - Cefazolin restarted.  - CT with abscess adjacent to driveline.  - had bleeding at DL over the weekend. Now has slowed - Surgical debridement in OR today with placcement of wound vac. D/w Dr. Laneta Simmers.    2.  Chronic Systolic HF due to NICM/VAD management - Echo EF 10% with biventricular failure - HM-3 VAD implant 02/26/19 - Echo 7/21 EF 40-45% - VAD interrogated personally. Parameters stable. - MAP 80s - Coreg 6.25 mg twice a day.   - Continue losartan - LDH 220 - INR 1.1 continue heparin (off for OR). Restart warfarin Discussed dosing with PharmD personally.  3. Anemia - Baseline hgb has been upper 9's, admission hgb 7.7 => 6.1.  However, hgb up to 10.5 after 2 units PRCs and stool was heme negative. - no further bleeding at DL site - Hgb 9.2 -> 8.9 -> 9.3  4. F/E/N - We continue to worry about his nutritional status after his mother died. - Appetite has been great in the hospital.  5. Broken Teeth - 3/31 s/p extractions of teeth #5 and 15  - Appreciate dental surgery  - site looks good  6. Hypokalemia/ Hypomagnesemia  - Mg 1.9, K 4.0 will supplement.   I reviewed the LVAD parameters from today, and compared the results to the  patient's prior recorded data.  No programming changes were made.  The LVAD is functioning within specified parameters.  The patient performs LVAD self-test daily.  LVAD interrogation was negative for any significant power changes, alarms or PI events/speed drops.  LVAD equipment check completed and is in good working order.  Back-up equipment present.   LVAD education done on emergency procedures and precautions and reviewed exit site care.  Length of Stay: 41  Arvilla Meres, MD 05/16/2020, 5:36 AM  VAD Team --- VAD ISSUES ONLY--- Pager [phone] (7am - 7am)  Advanced Heart Failure Team  Pager [phone] (M-F; 7a - 5p)  Please contact [facility] for night-coverage after hours (5p -7a ) and weekends on [email or web address]

## 2020-05-16 NOTE — Progress Notes (Addendum)
VD Coordinator Procedure Note:   VAD Coordinator met patient in OR. Pt undergoing drive line debridement per Dr. Cyndia Bent. Hemodynamics and VAD parameters monitored by myself and anesthesia throughout the procedure. Blood pressures were obtained with automatic cuff on right arm and correlated with Doppler. BP maintained with Levophed drip and Albumin x 2.     Time: Doppler Auto  BP Flow PI Power Speed  Pre-procedure:  1150  101/77 (85) 3.0 8.8 3.7 5400                    Sedation Induction: 1156  95/76 (83) 3.6 7.0 3.8 5400   1200  95/68 (77) 2.9 9.7 4.0 5400   1215  79/55 (65) 3.7 6.0 3.8 5400   1220  111/92 (101)       1230  95/75 (82) 3.8 4.3 3.8 5400   1245  89/63 (72) 3.8 4.8 3.8 5400   1250  97/83 (88) 3.4 6.3 4.0 5400   1300  126/82 (94) 3.3 6.6 3.9 5400   1310  80/62 (69) 3.7 5.5 3.8 5400  Recovery Area: 1325  91/52 (66) 3.8 4.9 3.9 5400   1330  97/83 (89) 3.7 4.6 3.8 5400                     Patient Disposition: Patient tolerated the procedure well. VAD Coordinator accompanied and remained with patient in recovery area. Received 25 mg IV Fentanyl in PACU. Transported patient to 2C04.  Plan for wound vac change on Friday at bedside per VAD coordinator / Dr Cyndia Bent.   Emerson Monte RN [place] Coordinator  Office: [phone]  24/7 Pager: [phone]

## 2020-05-16 NOTE — Transfer of Care (Signed)
Immediate Anesthesia Transfer of Care Note  Patient: Jon Martin  Procedure(s) Performed: IRRIGATION AND DEBRIDEMENT VAD DRIVELINE WOUND (Left Abdomen) APPLICATION OF WOUND VAC (Left Abdomen)  Patient Location: PACU  Anesthesia Type:General  Level of Consciousness: awake, alert  and oriented  Airway & Oxygen Therapy: Patient Spontanous Breathing  Post-op Assessment: Report given to RN, Post -op Vital signs reviewed and stable and Patient moving all extremities  Post vital signs: Reviewed and stable  Last Vitals:  Vitals Value Taken Time  BP 91/52   Temp    Pulse 82   Resp 14   SpO2 100     Last Pain:  Vitals:   05/16/20 0759  TempSrc: Oral  PainSc:          Complications: No complications documented.

## 2020-05-16 NOTE — Anesthesia Postprocedure Evaluation (Signed)
Anesthesia Post Note  Patient: Tandre Conly  Procedure(s) Performed: IRRIGATION AND DEBRIDEMENT VAD DRIVELINE WOUND (Left Abdomen) APPLICATION OF WOUND VAC (Left Abdomen)     Patient location during evaluation: PACU Anesthesia Type: General Level of consciousness: awake and alert Pain management: pain level controlled Vital Signs Assessment: post-procedure vital signs reviewed and stable Respiratory status: spontaneous breathing, nonlabored ventilation, respiratory function stable and patient connected to nasal cannula oxygen Cardiovascular status: blood pressure returned to baseline and stable Postop Assessment: no apparent nausea or vomiting Anesthetic complications: no   No complications documented.  Last Vitals:  Vitals:   05/16/20 0759 05/16/20 1321  BP:  (!) 91/52  Pulse: 78 86  Resp:  18  Temp: 36.8 C 36.6 C  SpO2: 96% 100%    Last Pain:  Vitals:   05/16/20 0759  TempSrc: Oral  PainSc:                  Shelton Silvas

## 2020-05-16 NOTE — Brief Op Note (Addendum)
05/16/2020  3:35 PM  PATIENT:  Jon Martin  35 y.o. male  PRE-OPERATIVE DIAGNOSIS:  LVAD DRIVELINE INFECTION  POST-OPERATIVE DIAGNOSIS:  LVAD DRIVELINE INFECTION  PROCEDURE:  Procedure(s): IRRIGATION AND DEBRIDEMENT VAD DRIVELINE WOUND (Left) APPLICATION OF WOUND VAC (Left)  SURGEON:  Surgeon(s) and Role:    * Dayden Viverette, Payton Doughty, MD - Primary  PHYSICIAN ASSISTANT: none  ASSISTANTS: none  ANESTHESIA:   general  EBL:  10 mL   BLOOD ADMINISTERED:none  DRAINS: none   LOCAL MEDICATIONS USED:  NONE  SPECIMEN:  Source of Specimen:  wound culture  DISPOSITION OF SPECIMEN:  micro  COUNTS:  YES  TOURNIQUET:  * No tourniquets in log *  DICTATION: .Note written in EPIC  PLAN OF CARE: Admit to inpatient   PATIENT DISPOSITION:  PACU - hemodynamically stable.   Delay start of Pharmacological VTE agent due to surgical blood loss or risk of bleeding: until 10 pm tonight.

## 2020-05-16 NOTE — Anesthesia Procedure Notes (Signed)
Procedure Name: Intubation Date/Time: 05/16/2020 11:59 AM Performed by: Audie Pinto, RN Pre-anesthesia Checklist: Patient identified, Emergency Drugs available, Suction available and Patient being monitored Patient Re-evaluated:Patient Re-evaluated prior to induction Oxygen Delivery Method: Circle System Utilized Preoxygenation: Pre-oxygenation with 100% oxygen Induction Type: IV induction Ventilation: Mask ventilation without difficulty Laryngoscope Size: Mac and 4 Grade View: Grade I Tube type: Oral Tube size: 7.5 mm Number of attempts: 1 Airway Equipment and Method: Stylet and Oral airway Placement Confirmation: ETT inserted through vocal cords under direct vision,  positive ETCO2 and breath sounds checked- equal and bilateral Secured at: 22 cm Tube secured with: Tape Dental Injury: Teeth and Oropharynx as per pre-operative assessment

## 2020-05-16 NOTE — Interval H&P Note (Signed)
History and Physical Interval Note:  05/16/2020 11:29 AM  Jon Martin  has presented today for surgery, with the diagnosis of LVAD DRIVELINE INFECTION.  The various methods of treatment have been discussed with the patient and family. After consideration of risks, benefits and other options for treatment, the patient has consented to  Procedure(s): IRRIGATION AND DEBRIDEMENT VAD DRIVELINE WOUND (N/A) APPLICATION OF WOUND VAC (N/A) as a surgical intervention.  The patient's history has been reviewed, patient examined, no change in status, stable for surgery.  I have reviewed the patient's chart and labs.  Questions were answered to the patient's satisfaction.     Alleen Borne

## 2020-05-16 NOTE — Progress Notes (Signed)
ANTICOAGULATION CONSULT NOTE  Pharmacy Consult for heparin Indication: LVAD  No Known Allergies  Patient Measurements: Height: 5\' 5"  (165.1 cm) Weight: 56 kg (123 lb 7.3 oz) IBW/kg (Calculated) : 61.5  Heparin dosing weight: 55.2 kg  Vital Signs: Temp: 98.6 F (37 C) (04/12 2020) Temp Source: Oral (04/12 2020) BP: 78/59 (04/12 2020) Pulse Rate: 92 (04/12 2020)  Labs: Recent Labs    05/14/20 0022 05/15/20 0045 05/16/20 0112  HGB 8.9*  --  9.3*  HCT 28.2*  --  29.3*  PLT 303  --  265  LABPROT 20.2* 16.8* 14.2  INR 1.8* 1.4* 1.1  HEPARINUNFRC 0.27* 0.22* 0.23*  CREATININE 0.76 0.83 0.79    Estimated Creatinine Clearance: 103.1 mL/min (by C-G formula based on SCr of 0.79 mg/dL).   Assessment: 78 yom presenting with concern for driveline infection - hx HM3 implanted on 02/26/19 - on warfarin PTA. PTA regimen is 2.5 mg daily. He is now s/p dental extractions on 3/31.  S/p OR for I&D of VAD driveline wound today 4/12.  Heparin to be resumed tonight - heparin was previously stable on 900 units/hr.  No bleeding per RN.  Goal of Therapy:  Heparin level ~0.3 units/mL INR 2-2.5 Monitor platelets by anticoagulation protocol: Yes   Plan:  At 2200, resume IV heparin at 900 units/hr  F/U AM labs  Shayne Diguglielmo D. 6/12, PharmD, BCPS, BCCCP 05/16/2020, 9:30 PM

## 2020-05-16 NOTE — Progress Notes (Signed)
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   HM III LVAD implanted on 02/26/19 by Dr. Maren Beach under Destination Therapy criteria.  Patient awake, alert, pleasant this am. He denies complaints, says he feels good today. He states he is "ready to get surgery over with and go home."   Vital signs: Temp: 98.2 HR: 78 Doppler Pressure: 80/0  Automatic BP:  96/62 (71) O2 Sat: 96% RA  Wt: 122.8>123>124.1>120.8>121.4>121.6>121.9>123.2>121.9>120.8>122>123.4 lbs   LVAD interrogation reveals:  Speed: 5400 Flow: 3.7 Power: 3.9w PI: 5.8 Hct: 20 - do not change  Alarms: none Events: 20 PI so far today  Fixed speed: 5400 Low speed limit: 5100  Drive Line: Existing VAD dressing clean, dry, intact. Dressing will be changed by Dr Laneta Simmers in OR today.   Abdominal Abcsess: Dressing intact. Dressing will be changed in OR today.   Labs:  LDH trend: 181>171>185>196>193>295>233>213>222>197>212>220  INR trend: 2.3>1.7>1.2>1.1>1.1>1.0>1.0>1.1>1.5>1.4>1.1  Anticoagulation Plan: -INR Goal: 2.0 - 2.5 -ASA Dose: none  Blood Products:  - 04/29/20>>2 units PCs  Device: N/A  Infection:  - 04/28/20 BCs>> No growth final - 04/28/20 DL culture>>rare staph aureus; final  - 05/04/20 BC>> No growth, final - 05/09/20 Abdominal abscess culture>> Staph aureus  Drips:  Heparin @ 900 units/hr  Plan/Recommendations:  1. Call VAD Coordinator if any VAD equipment or drive line issues. 2. Daily dressing changes on driveline.  3. VAD coordinator will accompany patient to OR for drive line debridement today.   Alyce Pagan RN VAD Coordinator  Office: [phone]  24/7 Pager: [phone]

## 2020-05-17 ENCOUNTER — Encounter (HOSPITAL_COMMUNITY): Payer: Self-pay | Admitting: Surgery

## 2020-05-17 DIAGNOSIS — Z95811 Presence of heart assist device: Secondary | ICD-10-CM | POA: Diagnosis not present

## 2020-05-17 DIAGNOSIS — T827XXA Infection and inflammatory reaction due to other cardiac and vascular devices, implants and grafts, initial encounter: Secondary | ICD-10-CM | POA: Diagnosis not present

## 2020-05-17 DIAGNOSIS — I5022 Chronic systolic (congestive) heart failure: Secondary | ICD-10-CM | POA: Diagnosis not present

## 2020-05-17 LAB — BASIC METABOLIC PANEL
Anion gap: 9 (ref 5–15)
BUN: 6 mg/dL (ref 6–20)
CO2: 24 mmol/L (ref 22–32)
Calcium: 9.3 mg/dL (ref 8.9–10.3)
Chloride: 102 mmol/L (ref 98–111)
Creatinine, Ser: 0.78 mg/dL (ref 0.61–1.24)
GFR, Estimated: >60 mL/min (ref >60)
Glucose, Bld: 125 mg/dL — ABNORMAL HIGH (ref 70–99)
Potassium: 3.7 mmol/L (ref 3.5–5.1)
Sodium: 135 mmol/L (ref 135–145)

## 2020-05-17 LAB — PROTIME-INR
INR: 1.2 (ref 0.8–1.2)
Prothrombin Time: 14.7 seconds (ref 11.4–15.2)

## 2020-05-17 LAB — CBC
HCT: 26.4 % — ABNORMAL LOW (ref 39.0–52.0)
Hemoglobin: 8.3 g/dL — ABNORMAL LOW (ref 13.0–17.0)
MCH: 30.6 pg (ref 26.0–34.0)
MCHC: 31.4 g/dL (ref 30.0–36.0)
MCV: 97.4 fL (ref 80.0–100.0)
Platelets: 217 10*3/uL (ref 150–400)
RBC: 2.71 MIL/uL — ABNORMAL LOW (ref 4.22–5.81)
RDW: 21 % — ABNORMAL HIGH (ref 11.5–15.5)
WBC: 7.7 10*3/uL (ref 4.0–10.5)
nRBC: 0 % (ref 0.0–0.2)

## 2020-05-17 LAB — HEPARIN LEVEL (UNFRACTIONATED)
Heparin Unfractionated: 0.11 IU/mL — ABNORMAL LOW (ref 0.30–0.70)
Heparin Unfractionated: 0.18 IU/mL — ABNORMAL LOW (ref 0.30–0.70)

## 2020-05-17 LAB — LACTATE DEHYDROGENASE: LDH: 198 U/L — ABNORMAL HIGH (ref 98–192)

## 2020-05-17 LAB — MAGNESIUM: Magnesium: 1.8 mg/dL (ref 1.7–2.4)

## 2020-05-17 MED ORDER — POTASSIUM CHLORIDE CRYS ER 20 MEQ PO TBCR
40.0000 meq | EXTENDED_RELEASE_TABLET | Freq: Once | ORAL | Status: AC
  Administered 2020-05-17: 40 meq via ORAL
  Filled 2020-05-17: qty 2

## 2020-05-17 MED ORDER — MAGNESIUM SULFATE 2 GM/50ML IV SOLN
2.0000 g | Freq: Once | INTRAVENOUS | Status: AC
  Administered 2020-05-17: 2 g via INTRAVENOUS
  Filled 2020-05-17: qty 50

## 2020-05-17 NOTE — TOC Progression Note (Addendum)
Transition of Care [facility]) - Progression Note    Patient Details  Name: Jon Martin MRN: [id number] Date of Birth: [birth year]  [facility]) CM/SW Contact  Leone Haven, RN Phone Number: 05/17/2020, 12:56 PM  Clinical Narrative:    Wound VAC form left on front of chart for MD to fill out. Per Amy , right now we are not sure if patient will go home with wound vac, took paper work off chart.        Expected Discharge Plan and Services                                                 Social Determinants of Health (SDOH) Interventions    Readmission Risk Interventions No flowsheet data found.

## 2020-05-17 NOTE — Progress Notes (Signed)
ANTICOAGULATION CONSULT NOTE  Pharmacy Consult for heparin/coumadin Indication: LVAD  No Known Allergies  Patient Measurements: Height: 5\' 5"  (165.1 cm) Weight: 56.6 kg (124 lb 12.5 oz) IBW/kg (Calculated) : 61.5  Heparin dosing weight: 55.2 kg  Vital Signs: Temp: 98.4 F (36.9 C) (04/13 0824) Temp Source: Oral (04/13 0824) BP: 103/84 (04/13 0824) Pulse Rate: 82 (04/13 0354)  Labs: Recent Labs    05/15/20 0045 05/16/20 0112 05/17/20 0224 05/17/20 1131  HGB  --  9.3* 8.3*  --   HCT  --  29.3* 26.4*  --   PLT  --  265 217  --   LABPROT 16.8* 14.2 14.7  --   INR 1.4* 1.1 1.2  --   HEPARINUNFRC 0.22* 0.23* 0.11* 0.18*  CREATININE 0.83 0.79 0.78  --     Estimated Creatinine Clearance: 104.2 mL/min (by C-G formula based on SCr of 0.78 mg/dL).   Medical History: Past Medical History:  Diagnosis Date  . Dilated cardiomyopathy (HCC)   . Dilated cardiomyopathy (HCC) 02/2019  . Polysubstance abuse (HCC)     Assessment: 59 yom presenting with concern for driveline infection - hx HM3 implanted on 02/26/19 - on warfarin PTA. PTA regimen is 2.5 mg daily. He is now s/p dental extractions on 3/31.  INR down to 1.1  today after holding Coumadin over the weekend He has required much higher doses inpatient than PTA given improved nutrition > will resume warfarin at 5mg  daily and keep INR < 2 for possible debridement of DLI Heparin drip 900 uts/hr HL 0.22 at goal   Bleeding from site where abscess was lanced, now improved.  CBC fairly stable. Heparin to be turned off for OR today will follow up after for anticoagulation plans  Goal of Therapy:  Heparin level ~0.3 INR 2-2.5 Monitor platelets by anticoagulation protocol: Yes   Plan:  Increase heparin slightly to 950 units/hr.  Recheck heparin level with AM labs. Continue warfarin 5mg  daily.  Aiming for INR no higher than 2 given wound vac change planned for Friday. AM CBC, protime and heparin level.  , , BCCP Clinical Pharmacist  05/17/2020 12:46 PM   [facility] pharmacy phone numbers are listed on [email or web address]

## 2020-05-17 NOTE — TOC Progression Note (Addendum)
Transition of Care [facility]) - Progression Note    Patient Details  Name: Jon Martin MRN: [id number] Date of Birth: 07/29/1985  Transition of Care [facility]) CM/SW Contact  Leone Haven, RN Phone Number: 05/17/2020, 4:22 PM  Clinical Narrative:    Patient states he lives in [place].  He will be on iv abx for 4 weeks, [facility] [address] [facility] is following for medication, and she will see if Anastasia Fiedler will be able to do the [facility] for iv abx.  NCM offered choice, he does not have a preference for an agency for [facility]. , he conts on Hep Drip INR is 1.2 . Not sure if patient will go home with a wound vac, so too early to order one.        Expected Discharge Plan and Services                                                 Social Determinants of Health (SDOH) Interventions    Readmission Risk Interventions No flowsheet data found.

## 2020-05-17 NOTE — Progress Notes (Signed)
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   HM III LVAD implanted on 02/26/19 by Dr. Maren Beach under Destination Therapy criteria.  Patient awake, alert, pleasant this am. Reports no pain at incision site from I&D of abscess site near VAD drive line. DL dressing dry and intact, small wound vac on above site with no issues.   Vital signs: Temp: 98.4 HR: 82 Doppler Pressure: 92 Automatic BP:  103/84 (91) O2 Sat: 99% RA  Wt: 122.8>123>124.1>120.8>121.4>121.6.......123.4>124.7 lbs   LVAD interrogation reveals:  Speed: 5400 Flow: 3.8 Power: 3.8w PI: 4.2 Hct: 20 - do not change  Alarms: none Events: 34 PI events yesterday Fixed speed: 5400 Low speed limit: 5100  Drive Line: Existing VAD dressing clean, dry, intact. Planned wound vac and DL dressing change Friday per VAD Coordinator.   Abdominal Abcsess: Wound vac change planned for Friday per VAD Coordinator.  Labs:  LDH trend: 181>171........212>220>198  INR trend: 2.3>1.7>1.2>1.1.......1.2  Anticoagulation Plan: -INR Goal: 2.0 - 2.5 -ASA Dose: none  Blood Products:  - 04/29/20>>2 units PCs  Device: N/A  Infection:  - 04/28/20 BCs>> No growth final - 04/28/20 DL culture>>rare staph aureus; final  - 05/04/20 BC>> No growth, final - 05/09/20 Abdominal abscess culture>> Staph aureus  Drips:  Heparin @ 900 units/hr  Plan/Recommendations:  1. Call VAD Coordinator if any VAD equipment or drive line issues. 2. Weekly VAD drive line dressing changes or as needed with wound VAC change.  3. Wound VAD change planned Friday,  05/19/20 per VAD Coordinator.  [name] RN VAD Coordinator  Office: [phone]  24/7 Pager: [phone]

## 2020-05-17 NOTE — Progress Notes (Signed)
Patient ID: Jon Martin, male   DOB: 22-Jan-1986, 35 y.o.   MRN: [id number]   Advanced Heart Failure VAD Team Note  PCP-Cardiologist: No primary care provider on file.   Subjective:     HM3 LVAD admitted w/ Drive-line infection.  - Wound culture with S aureus. Blood cx NGTD - 3/26 abx narrowed to cefazolin 2gm IV Q 8hr. ID following  - 3/28 CT abd/pelvis soft tissue no deep infection. Oral Surgeon consulted.  - 3/31 s/p extractions of teeth #5 and 15  - 3/31 Low flow alarms last night, got 1 L IVF and low flows have stopped - 4/5 small pustule on abdomen noted>> performed superficial I&D at bedside, no communication with DL tunnel, cultures sent and pending  - 4/8 Abscess at driveline site opened . CT abdomen done, abscess adjacent to driveline noted. - 4/13 I&D of driveline abscess  Feels ok. IVs bugging him. No pain at wound vac site. No fevers or chills. No orthopnea or PND. Appetite ok   LVAD INTERROGATION:   HeartMate 3 LVAD:   Flow 3.4 liters/min, speed 5400, power 4.0  PI 6.7 VAD interrogated personally. Parameters stable.  Objective:    Vital Signs:   Temp:  [97.9 F (36.6 C)-98.8 F (37.1 C)] 98.8 F (37.1 C) (04/13 0354) Pulse Rate:  [75-92] 82 (04/13 0354) Resp:  [13-19] 19 (04/13 0354) BP: (78-108)/(52-89) 108/89 (04/13 0354) SpO2:  [94 %-100 %] 99 % (04/13 0354) Weight:  [56.6 kg] 56.6 kg (04/13 0354) Last BM Date: 05/15/20   Mean arterial Pressure 80-90s  Intake/Output:   Intake/Output Summary (Last 24 hours) at 05/17/2020 0813 Last data filed at 05/17/2020 0400 Gross per 24 hour  Intake 2144.57 ml  Output 1765 ml  Net 379.57 ml     Physical Exam   General:  Thin young AAM, Sitting up in bed. No distress  HEENT: normal  Neck: supple. JVP not elevated.  Carotids 2+ bilat; no bruits. No lymphadenopathy or thryomegaly appreciated. Cor: LVAD hum.  Lungs: Clear. Abdomen: obese soft, nontender, non-distended. No hepatosplenomegaly. No bruits or masses.  Good bowel sounds. Driveline site with wound vac. No leakage . Anchor in place.  Extremities: no cyanosis, clubbing, rash. Warm no edema  Neuro: alert & oriented x 3. No focal deficits. Moves all 4 without problem   Telemetry   Sinus 80-90s occasional PVCs.  Personally reviewed   Labs   Basic Metabolic Panel: Recent Labs  Lab 05/13/20 0051 05/14/20 0022 05/15/20 0045 05/16/20 0112 05/17/20 0224  NA 137 137 137 136 135  K 3.6 3.6 3.7 4.0 3.7  CL 103 103 104 102 102  CO2 26 26 25 23 24   GLUCOSE 96 92 104* 107* 125*  BUN 6 6 6 6 6   CREATININE 0.78 0.76 0.83 0.79 0.78  CALCIUM 9.1 9.2 9.5 9.4 9.3  MG 1.7 1.8 1.7 1.9 1.8  PHOS  --  3.7  --   --   --     Liver Function Tests: Recent Labs  Lab 05/14/20 0022  AST 54*  ALT 39  ALKPHOS 116  BILITOT 0.6  PROT 6.9  ALBUMIN 3.4*   No results for input(s): LIPASE, AMYLASE in the last 168 hours. No results for input(s): AMMONIA in the last 168 hours.  CBC: Recent Labs  Lab 05/12/20 0030 05/13/20 0051 05/14/20 0022 05/16/20 0112 05/17/20 0224  WBC 8.2 8.3 6.5 8.9 7.7  HGB 8.9* 9.2* 8.9* 9.3* 8.3*  HCT 28.5* 27.7* 28.2* 29.3* 26.4*  MCV  96.0 96.9 97.2 95.8 97.4  PLT 343 336 303 265 217    INR: Recent Labs  Lab 05/13/20 0051 05/14/20 0022 05/15/20 0045 05/16/20 0112 05/17/20 0224  INR 1.7* 1.8* 1.4* 1.1 1.2    Other results:    Imaging   No results found.   Medications:     Scheduled Medications: . carvedilol  6.25 mg Oral BID WC  . losartan  25 mg Oral Daily  . pantoprazole  40 mg Oral Daily  . sodium chloride flush  3 mL Intravenous Q12H  . warfarin  5 mg Oral q1600  . Warfarin - Pharmacist Dosing Inpatient   Does not apply q1600    Infusions: . sodium chloride    .  ceFAZolin (ANCEF) IV 2 g (05/17/20 4098)  . heparin 900 Units/hr (05/17/20 0300)  . lactated ringers Stopped (05/16/20 1321)  . lactated ringers 10 mL/hr at 05/16/20 1136    PRN Medications: sodium chloride,  acetaminophen, ondansetron (ZOFRAN) IV   Patient Profile   35 y/o male w/ chronic systolic HF s/p HM3 LVAD admitted w/ staph aureus drive-line infection   Assessment/Plan:    1. 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 -  Admitted for IV abx vancomycin/cefepime/Flagyl - Driveline culture with S aureus. Blood cx NGTD  - 3/27 abx narrowed to cefazolin 2gm IV Q 8hr => stopped 3/31 and got ortivancin 4/1  - CT abd/pelvis- soft tissue infection. No deep infection - 4/5 small pustule on abdomen noted >> performed superficial I&D at bedside, no communication with DL tunnel, cultures growing GPC.  -4/8 now open area at medial aspect communicates with driveline. Increased induration noted. This area was packed with 1/2 inch packing strip.  - Cefazolin restarted.  - CT with abscess adjacent to driveline.  - S/p surgical debridement in OR 4/12 by Dr. Laneta Simmers. Wound vac in place. Scheduled for spnage change on Friday. Continue IV abx   2.  Chronic Systolic HF due to NICM/VAD management - Echo EF 10% with biventricular failure - HM-3 VAD implant 02/26/19 - Echo 7/21 EF 40-45% - VAD interrogated personally. Parameters stable. - MAP stable in 80s - Coreg 6.25 mg twice a day.   - Continue losartan - LDH 198 - INR 1.2 continue heparin. Back on warfarin Discussed dosing with PharmD personally.  3. Anemia - Baseline hgb has been upper 9's, admission hgb 7.7 => 6.1.  However, hgb up to 10.5 after 2 units PRCs and stool was heme negative. - no further bleeding at DL site - Hgb 9.2 -> 8.9 -> 9.3  4. F/E/N - We continue to worry about his nutritional status after his mother died. - Appetite has been great in the hospital.  5. Broken Teeth - 3/31 s/p extractions of teeth #5 and 15  - Appreciate dental surgery  - site looks good  6. Hypokalemia/ Hypomagnesemia  - Mg 1.8, K 3.7 will supplement.   I reviewed the LVAD parameters from today, and  compared the results to the patient's prior recorded data.  No programming changes were made.  The LVAD is functioning within specified parameters.  The patient performs LVAD self-test daily.  LVAD interrogation was negative for any significant power changes, alarms or PI events/speed drops.  LVAD equipment check completed and is in good working order.  Back-up equipment present.   LVAD education done on emergency procedures and precautions and reviewed exit site care.  Length of Stay: [address]  MD 05/17/2020, 8:13 AM  VAD Team --- VAD ISSUES ONLY--- Pager [phone] (7am - 7am)  Advanced Heart Failure Team  Pager [phone] (M-F; 7a - 5p)  Please contact [facility] for night-coverage after hours (5p -7a ) and weekends on [email or web address]

## 2020-05-17 NOTE — Plan of Care (Signed)
?  Problem: Education: ?Goal: Knowledge of General Education information will improve ?Description: Including pain rating scale, medication(s)/side effects and non-pharmacologic comfort measures ?Outcome: Progressing ?  ?Problem: Health Behavior/Discharge Planning: ?Goal: Ability to manage health-related needs will improve ?Outcome: Progressing ?  ?Problem: Clinical Measurements: ?Goal: Ability to maintain clinical measurements within normal limits will improve ?Outcome: Progressing ?Goal: Will remain free from infection ?Outcome: Progressing ?Goal: Diagnostic test results will improve ?Outcome: Progressing ?Goal: Respiratory complications will improve ?Outcome: Progressing ?Goal: Cardiovascular complication will be avoided ?Outcome: Progressing ?  ?Problem: Activity: ?Goal: Risk for activity intolerance will decrease ?Outcome: Progressing ?  ?Problem: Nutrition: ?Goal: Adequate nutrition will be maintained ?Outcome: Progressing ?  ?Problem: Coping: ?Goal: Level of anxiety will decrease ?Outcome: Progressing ?  ?Problem: Elimination: ?Goal: Will not experience complications related to bowel motility ?Outcome: Progressing ?Goal: Will not experience complications related to urinary retention ?Outcome: Progressing ?  ?Problem: Pain Managment: ?Goal: General experience of comfort will improve ?Outcome: Progressing ?  ?Problem: Safety: ?Goal: Ability to remain free from injury will improve ?Outcome: Progressing ?  ?Problem: Skin Integrity: ?Goal: Risk for impaired skin integrity will decrease ?Outcome: Progressing ?  ?Problem: Education: ?Goal: Patient will understand all VAD equipment and how it functions ?Outcome: Progressing ?Goal: Patient will be able to verbalize current INR target range and antiplatelet therapy for discharge home ?Outcome: Progressing ?  ?Problem: Cardiac: ?Goal: LVAD will function as expected and patient will experience no clinical alarms ?Outcome: Progressing ?  ?

## 2020-05-17 NOTE — Progress Notes (Signed)
1 Day Post-Op Procedure(s) (LRB): IRRIGATION AND DEBRIDEMENT VAD DRIVELINE WOUND (Left) APPLICATION OF WOUND VAC (Left) Subjective: No complaints.  Objective: Vital signs in last 24 hours: Temp:  [97.9 F (36.6 C)-98.8 F (37.1 C)] 98.8 F (37.1 C) (04/13 0354) Pulse Rate:  [75-92] 82 (04/13 0354) Cardiac Rhythm: Normal sinus rhythm (04/13 0700) Resp:  [13-19] 19 (04/13 0354) BP: (78-108)/(52-89) 108/89 (04/13 0354) SpO2:  [94 %-100 %] 99 % (04/13 0354) Weight:  [56.6 kg] 56.6 kg (04/13 0354)  Hemodynamic parameters for last 24 hours:    Intake/Output from previous day: 04/12 0701 - 04/13 0700 In: 2153.6 [P.O.:840; I.V.:463.6; IV Piggyback:850] Out: 2065 [Urine:2050; Drains:5; Blood:10] Intake/Output this shift: No intake/output data recorded.  Dressing dry Minimal output in VAC cannister. Maintaining good suction.  Lab Results: Recent Labs    05/16/20 0112 05/17/20 0224  WBC 8.9 7.7  HGB 9.3* 8.3*  HCT 29.3* 26.4*  PLT 265 217   BMET:  Recent Labs    05/16/20 0112 05/17/20 0224  NA 136 135  K 4.0 3.7  CL 102 102  CO2 23 24  GLUCOSE 107* 125*  BUN 6 6  CREATININE 0.79 0.78  CALCIUM 9.4 9.3    PT/INR:  Recent Labs    05/17/20 0224  LABPROT 14.7  INR 1.2   ABG    Component Value Date/Time   PHART 7.549 (H) 03/02/2019 0358   HCO3 26.5 03/02/2019 0358   TCO2 27 03/02/2019 0358   ACIDBASEDEF 0.5 02/28/2019 1740   O2SAT 62.2 03/15/2019 0335   CBG (last 3)  No results for input(s): GLUCAP in the last 72 hours.  Assessment/Plan: S/P Procedure(s) (LRB): IRRIGATION AND DEBRIDEMENT VAD DRIVELINE WOUND (Left) APPLICATION OF WOUND VAC (Left)  Continue NPWT. Plan to change VAC sponge with VAD coordinator on Friday. ID planning at least 4 weeks of IV antibiotics.   LOS: 19 days    Jon Martin 05/17/2020

## 2020-05-17 NOTE — Progress Notes (Signed)
    [facility]    Date of Admission:  04/28/2020   Total days of antibiotics 20           ID: Jon Martin is a 35 y.o. male with  MSSA drive line infection with subcutaneous abscess s/p I x D on 4/12 Principal Problem:   Infection associated with driveline of left ventricular assist device (LVAD) (HCC) Active Problems:   Chronic systolic heart failure (HCC)   Left ventricular assist device (LVAD) complication   Complication involving left ventricular assist device (LVAD)   Dental caries   Retained dental root   Chronic periodontitis   Loss of teeth due to extraction    Subjective: Afebrile, no abdominal discomfort. Talkative this morning. He reports that he has some family members that live with him. No diarrhea  ROS: 12 point ros is negative  Medications:  . carvedilol  6.25 mg Oral BID WC  . losartan  25 mg Oral Daily  . pantoprazole  40 mg Oral Daily  . sodium chloride flush  3 mL Intravenous Q12H  . warfarin  5 mg Oral q1600  . Warfarin - Pharmacist Dosing Inpatient   Does not apply q1600    Objective: Vital signs in last 24 hours: Temp:  [97.9 F (36.6 C)-98.8 F (37.1 C)] 98.4 F (36.9 C) (04/13 0824) Pulse Rate:  [75-92] 82 (04/13 0354) Resp:  [13-20] 20 (04/13 0824) BP: (78-108)/(52-89) 103/84 (04/13 0824) SpO2:  [94 %-100 %] 99 % (04/13 0354) Weight:  [56.6 kg] 56.6 kg (04/13 0354) Physical Exam  Constitutional: He is oriented to person, place, and time. He appears well-developed and well-nourished. No distress.  HENT:  Mouth/Throat: Oropharynx is clear and moist. No oropharyngeal exudate. Poor dentition Cardiovascular: generator hum heard throughout precordium Pulmonary/Chest: Effort normal and breath sounds normal. No respiratory distress. He has no wheezes.  Abdominal: Soft. Bowel sounds are normal. He exhibits no distension. There is no tenderness. driveline and wound vac in place Lymphadenopathy:  He has no cervical  adenopathy.  Neurological: He is alert and oriented to person, place, and time.  Skin: Skin is warm and dry. No rash noted. No erythema.  Psychiatric: He has a normal mood and affect. His behavior is normal.     Lab Results Recent Labs    05/16/20 0112 05/17/20 0224  WBC 8.9 7.7  HGB 9.3* 8.3*  HCT 29.3* 26.4*  NA 136 135  K 4.0 3.7  CL 102 102  CO2 23 24  BUN 6 6  CREATININE 0.79 0.78   No results found for: ESRSEDRATE, POCTSEDRATE  Microbiology: 4/12 gram stain no organism 4/5 cutaneous abscess MSSA Studies/Results: No results found.   Assessment/Plan: MSSA driveline infection with cutaneous abscess now surgical I x D = wound recommend to do IV abtx but there is questionable family support (his mother was his primary care taker who has recently passed away). We could plan to keep on cefazolin while hospitalized, then give a dose of oritavancin on day of discharge plus arrange for home health do give an additional dose   in 10 days followed by oral antibiotics. Versus going to a SNF for Iv abtx.  Will discuss plan with primary team and see how he will manage wound vac.  [facility] Cell: [phone] Pager: [phone]  05/17/2020, 10:54 AM

## 2020-05-18 ENCOUNTER — Encounter (HOSPITAL_COMMUNITY): Payer: 59

## 2020-05-18 DIAGNOSIS — A4901 Methicillin susceptible Staphylococcus aureus infection, unspecified site: Secondary | ICD-10-CM

## 2020-05-18 DIAGNOSIS — T829XXD Unspecified complication of cardiac and vascular prosthetic device, implant and graft, subsequent encounter: Secondary | ICD-10-CM | POA: Diagnosis not present

## 2020-05-18 DIAGNOSIS — K08199 Complete loss of teeth due to other specified cause, unspecified class: Secondary | ICD-10-CM | POA: Diagnosis not present

## 2020-05-18 DIAGNOSIS — T827XXA Infection and inflammatory reaction due to other cardiac and vascular devices, implants and grafts, initial encounter: Secondary | ICD-10-CM | POA: Diagnosis not present

## 2020-05-18 LAB — CBC
HCT: 27.8 % — ABNORMAL LOW (ref 39.0–52.0)
Hemoglobin: 8.7 g/dL — ABNORMAL LOW (ref 13.0–17.0)
MCH: 30.3 pg (ref 26.0–34.0)
MCHC: 31.3 g/dL (ref 30.0–36.0)
MCV: 96.9 fL (ref 80.0–100.0)
Platelets: 216 10*3/uL (ref 150–400)
RBC: 2.87 MIL/uL — ABNORMAL LOW (ref 4.22–5.81)
RDW: 21.2 % — ABNORMAL HIGH (ref 11.5–15.5)
WBC: 7.9 10*3/uL (ref 4.0–10.5)
nRBC: 0 % (ref 0.0–0.2)

## 2020-05-18 LAB — BASIC METABOLIC PANEL
Anion gap: 11 (ref 5–15)
BUN: 7 mg/dL (ref 6–20)
CO2: 22 mmol/L (ref 22–32)
Calcium: 9.6 mg/dL (ref 8.9–10.3)
Chloride: 103 mmol/L (ref 98–111)
Creatinine, Ser: 0.82 mg/dL (ref 0.61–1.24)
GFR, Estimated: >60 mL/min (ref >60)
Glucose, Bld: 99 mg/dL (ref 70–99)
Potassium: 4 mmol/L (ref 3.5–5.1)
Sodium: 136 mmol/L (ref 135–145)

## 2020-05-18 LAB — MAGNESIUM: Magnesium: 1.7 mg/dL (ref 1.7–2.4)

## 2020-05-18 LAB — LACTATE DEHYDROGENASE: LDH: 203 U/L — ABNORMAL HIGH (ref 98–192)

## 2020-05-18 LAB — HEPARIN LEVEL (UNFRACTIONATED): Heparin Unfractionated: 0.23 IU/mL — ABNORMAL LOW (ref 0.30–0.70)

## 2020-05-18 LAB — PROTIME-INR
INR: 1.1 (ref 0.8–1.2)
Prothrombin Time: 13.8 seconds (ref 11.4–15.2)

## 2020-05-18 MED ORDER — PROSOURCE PLUS PO LIQD
30.0000 mL | Freq: Two times a day (BID) | ORAL | Status: DC
  Administered 2020-05-18 – 2020-05-25 (×15): 30 mL via ORAL
  Filled 2020-05-18 (×15): qty 30

## 2020-05-18 MED ORDER — WARFARIN SODIUM 7.5 MG PO TABS
7.5000 mg | ORAL_TABLET | Freq: Once | ORAL | Status: AC
  Administered 2020-05-18: 7.5 mg via ORAL
  Filled 2020-05-18: qty 1

## 2020-05-18 MED ORDER — CARVEDILOL 6.25 MG PO TABS
9.3750 mg | ORAL_TABLET | Freq: Two times a day (BID) | ORAL | Status: DC
  Administered 2020-05-18 – 2020-05-25 (×14): 9.375 mg via ORAL
  Filled 2020-05-18 (×14): qty 1

## 2020-05-18 MED ORDER — MAGNESIUM SULFATE 4 GM/100ML IV SOLN
4.0000 g | Freq: Once | INTRAVENOUS | Status: AC
  Administered 2020-05-18: 4 g via INTRAVENOUS
  Filled 2020-05-18: qty 100

## 2020-05-18 MED ORDER — ADULT MULTIVITAMIN W/MINERALS CH
1.0000 | ORAL_TABLET | Freq: Every day | ORAL | Status: DC
  Administered 2020-05-18 – 2020-05-25 (×8): 1 via ORAL
  Filled 2020-05-18 (×8): qty 1

## 2020-05-18 NOTE — Plan of Care (Signed)
?  Problem: Education: ?Goal: Knowledge of General Education information will improve ?Description: Including pain rating scale, medication(s)/side effects and non-pharmacologic comfort measures ?Outcome: Progressing ?  ?Problem: Health Behavior/Discharge Planning: ?Goal: Ability to manage health-related needs will improve ?Outcome: Progressing ?  ?Problem: Clinical Measurements: ?Goal: Ability to maintain clinical measurements within normal limits will improve ?Outcome: Progressing ?Goal: Will remain free from infection ?Outcome: Progressing ?Goal: Diagnostic test results will improve ?Outcome: Progressing ?Goal: Respiratory complications will improve ?Outcome: Progressing ?Goal: Cardiovascular complication will be avoided ?Outcome: Progressing ?  ?Problem: Activity: ?Goal: Risk for activity intolerance will decrease ?Outcome: Progressing ?  ?Problem: Nutrition: ?Goal: Adequate nutrition will be maintained ?Outcome: Progressing ?  ?Problem: Coping: ?Goal: Level of anxiety will decrease ?Outcome: Progressing ?  ?Problem: Elimination: ?Goal: Will not experience complications related to bowel motility ?Outcome: Progressing ?Goal: Will not experience complications related to urinary retention ?Outcome: Progressing ?  ?Problem: Pain Managment: ?Goal: General experience of comfort will improve ?Outcome: Progressing ?  ?Problem: Safety: ?Goal: Ability to remain free from injury will improve ?Outcome: Progressing ?  ?Problem: Skin Integrity: ?Goal: Risk for impaired skin integrity will decrease ?Outcome: Progressing ?  ?Problem: Education: ?Goal: Patient will understand all VAD equipment and how it functions ?Outcome: Progressing ?Goal: Patient will be able to verbalize current INR target range and antiplatelet therapy for discharge home ?Outcome: Progressing ?  ?Problem: Cardiac: ?Goal: LVAD will function as expected and patient will experience no clinical alarms ?Outcome: Progressing ?  ?

## 2020-05-18 NOTE — Progress Notes (Signed)
Pharmacy Antibiotic Note  Jon Martin is a 34 y.o. male with Hx HF and LVAD HM3 placed 1/21 admitted on 04/28/2020 with  Drive line infection with staph aureus.  Per ID patient received a dose of oritavancin 4/2. 4/5 boil noted on patients abdomen and was lanced> grew staph aureus   Started on cefazolin while inpatient.  Per ID could consider IV cefazolin while inpatient, then oritavancin prior to discharge and repeat dose in 10 days (then oral antibiotics).  Plan: Continue Ancef 2gm IV q8h for now. Continue wound care and follow   Height: 5\' 5"  (165.1 cm) Weight: 56.4 kg (124 lb 5.4 oz) IBW/kg (Calculated) : 61.5  Temp (24hrs), Avg:98.6 F (37 C), Min:98.2 F (36.8 C), Max:99.2 F (37.3 C)  Recent Labs  Lab 05/13/20 0051 05/14/20 0022 05/15/20 0045 05/16/20 0112 05/17/20 0224 05/18/20 0033  WBC 8.3 6.5  --  8.9 7.7 7.9  CREATININE 0.78 0.76 0.83 0.79 0.78 0.82    Estimated Creatinine Clearance: 101.3 mL/min (by C-G formula based on SCr of 0.82 mg/dL).    No Known Allergies  Antimicrobials this admission: Vanc 3/25 > 3/27 Cefepime 3/25 > 3/27 Flagyl 3/25>3/27 Ancef 3/27>4/1   Resume 4/8>  Oritavancin 4/2 x1  Microbiology results: 4/12 Wound Cx >  4/5 abdominal abscess > MSSA 3/25 BCx neg 3/25 DL > MSSA (R: clinda, erythro)  4/25, Reece Leader, BCPS, BCCP Clinical Pharmacist  05/18/2020 11:24 AM   [facility] pharmacy phone numbers are listed on [email or web address]

## 2020-05-18 NOTE — Progress Notes (Signed)
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   HM III LVAD implanted on 02/26/19 by Dr. Maren Beach under Destination Therapy criteria.  Patient awake, alert, watching videos on phone. Denies complaints.    Vital signs: Temp: 98.6 HR: 86 Doppler Pressure: 90 Automatic BP:  97/78 (86) O2 Sat: 97% RA  Wt: 122.8>123>124.1>120.8>121.4>121.6.......123.4>124.7>124.3 lbs   LVAD interrogation reveals:  Speed: 5400 Flow: 3.7 Power: 3.9w PI: 4.8 Hct: 20 - do not change  Alarms: none Events: 78 PI events yesterday; 43 so far today Fixed speed: 5400 Low speed limit: 5100  Drive Line: Existing VAD dressing clean, dry, intact. Planned wound vac change Friday per Dr. Laneta Simmers and  AD Coordinator. Will change DL dressing at that time. Anchor not intact; replaced.   Abdominal Abcsess: Wound vac in place with small amount bloody drainage. Wound vac change planned for Friday per Dr. Laneta Simmers and VAD Coordinator.  Labs:  LDH trend: 181>171........212>220>198>203  INR trend: 2.3>1.7>1.2>1.1.......1.2>1.1  Anticoagulation Plan: -INR Goal: 2.0 - 2.5 -ASA Dose: none  Blood Products:  - 04/29/20>>2 units PCs  Device: N/A  Infection:  - 04/28/20 BCs>> No growth final - 04/28/20 DL culture>>rare staph aureus; final  - 05/04/20 BC>> No growth, final - 05/09/20 Abdominal abscess culture>> Staph aureus  Drips:  Heparin @ 1000 units/hr  Plan/Recommendations:  1. Call VAD Coordinator if any VAD equipment or drive line issues. 2. Weekly VAD drive line dressing changes or as needed with wound VAC change.  3. Wound VAD change planned Friday,  05/19/20 per Dr. Laneta Simmers and VAD Coordinator.  [name] RN  VAD Coordinator  Office: [phone]  24/7 Pager: [phone]

## 2020-05-18 NOTE — Progress Notes (Addendum)
Patient ID: Jon Martin, male   DOB: 1985-06-11, 35 y.o.   MRN: [id number]   Advanced Heart Failure VAD Team Note  PCP-Cardiologist: No primary care provider on file.   Subjective:     HM3 LVAD admitted w/ Drive-line infection.  - Wound culture with S aureus. Blood cx NGTD - 3/26 abx narrowed to cefazolin 2gm IV Q 8hr. ID following  - 3/28 CT abd/pelvis soft tissue no deep infection. Oral Surgeon consulted.  - 3/31 s/p extractions of teeth #5 and 15  - 3/31 Low flow alarms last night, got 1 L IVF and low flows have stopped - 4/5 small pustule on abdomen noted>> performed superficial I&D at bedside, no communication with DL tunnel, cultures sent and pending  - 4/8 Abscess at driveline site opened . CT abdomen done, abscess adjacent to driveline noted. - 4/13 I&D of driveline abscess  No complaints.   LVAD INTERROGATION:   HeartMate 3 LVAD:   Flow 3.7  liters/min, speed 5400, power 4.0  PI 4.7 VAD interrogated personally. Parameters stable.  Objective:    Vital Signs:   Temp:  [98.2 F (36.8 C)-99.2 F (37.3 C)] 98.6 F (37 C) (04/14 0724) Pulse Rate:  [73-93] 81 (04/14 0415) Resp:  [13-20] 19 (04/14 0745) BP: (85-123)/(61-82) 97/78 (04/14 0724) SpO2:  [95 %-98 %] 95 % (04/14 0415) Weight:  [56.4 kg] 56.4 kg (04/14 0415) Last BM Date: 05/17/20   Mean arterial Pressure 80-90s  Intake/Output:   Intake/Output Summary (Last 24 hours) at 05/18/2020 1208 Last data filed at 05/18/2020 1000 Gross per 24 hour  Intake 1404.21 ml  Output 2075 ml  Net -670.79 ml     Physical Exam   Physical Exam: GENERAL: No acute distress. HEENT: normal  NECK: Supple, JVP flat  .  2+ bilaterally, no bruits.  No lymphadenopathy or thyromegaly appreciated.   CARDIAC:  Mechanical heart sounds with LVAD hum present.  LUNGS:  Clear to auscultation bilaterally.  ABDOMEN:  Soft, round, nontender, positive bowel sounds x4.     LVAD exit site: VAC dressing in place around VAD.  EXTREMITIES:  Warm  and dry, no cyanosis, clubbing, rash or edema. RUE PICC  NEUROLOGIC:  Alert and oriented x 3.    No aphasia.  No dysarthria.  Affect pleasant.      Telemetry   SR 80-90s personally reviewed.    Labs   Basic Metabolic Panel: Recent Labs  Lab 05/14/20 0022 05/15/20 0045 05/16/20 0112 05/17/20 0224 05/18/20 0033  NA 137 137 136 135 136  K 3.6 3.7 4.0 3.7 4.0  CL 103 104 102 102 103  CO2 26 25 23 24 22   GLUCOSE 92 104* 107* 125* 99  BUN 6 6 6 6 7   CREATININE 0.76 0.83 0.79 0.78 0.82  CALCIUM 9.2 9.5 9.4 9.3 9.6  MG 1.8 1.7 1.9 1.8 1.7  PHOS 3.7  --   --   --   --     Liver Function Tests: Recent Labs  Lab 05/14/20 0022  AST 54*  ALT 39  ALKPHOS 116  BILITOT 0.6  PROT 6.9  ALBUMIN 3.4*   No results for input(s): LIPASE, AMYLASE in the last 168 hours. No results for input(s): AMMONIA in the last 168 hours.  CBC: Recent Labs  Lab 05/13/20 0051 05/14/20 0022 05/16/20 0112 05/17/20 0224 05/18/20 0033  WBC 8.3 6.5 8.9 7.7 7.9  HGB 9.2* 8.9* 9.3* 8.3* 8.7*  HCT 27.7* 28.2* 29.3* 26.4* 27.8*  MCV 96.9 97.2 95.8  97.4 96.9  PLT 336 303 265 217 216    INR: Recent Labs  Lab 05/14/20 0022 05/15/20 0045 05/16/20 0112 05/17/20 0224 05/18/20 0033  INR 1.8* 1.4* 1.1 1.2 1.1    Other results:    Imaging   No results found.   Medications:     Scheduled Medications: . carvedilol  6.25 mg Oral BID WC  . losartan  25 mg Oral Daily  . pantoprazole  40 mg Oral Daily  . sodium chloride flush  3 mL Intravenous Q12H  . warfarin  7.5 mg Oral ONCE-1600  . Warfarin - Pharmacist Dosing Inpatient   Does not apply q1600    Infusions: . sodium chloride    .  ceFAZolin (ANCEF) IV 2 g (05/18/20 3254)  . heparin 1,000 Units/hr (05/18/20 1146)  . lactated ringers Stopped (05/16/20 1321)  . lactated ringers 10 mL/hr at 05/16/20 1136  . magnesium sulfate bolus IVPB 4 g (05/18/20 1152)    PRN Medications: sodium chloride, acetaminophen, ondansetron (ZOFRAN)  IV   Patient Profile   35 y/o male w/ chronic systolic HF s/p HM3 LVAD admitted w/ staph aureus drive-line infection   Assessment/Plan:    1. 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 -  Admitted for IV abx vancomycin/cefepime/Flagyl - Driveline culture with S aureus. Blood cx NGTD  - 3/27 abx narrowed to cefazolin 2gm IV Q 8hr => stopped 3/31 and got ortivancin 4/1  - CT abd/pelvis- soft tissue infection. No deep infection - 4/5 small pustule on abdomen noted >> performed superficial I&D at bedside, no communication with DL tunnel, cultures growing GPC.  -4/8 had open area at medial aspect communicates with driveline. Increased induration noted. This area was packed with 1/2 inch packing strip.  - Cefazolin restarted for total 4 weeks. He cant go home with PICC for home antibiotics. - CT with abscess adjacent to driveline.  - S/p surgical debridement in OR 4/12 by Dr. Laneta Simmers. Wound vac in place. Scheduled for bedside dressing change 4/15.    2.  Chronic Systolic HF due to NICM/VAD management - Echo EF 10% with biventricular failure - HM-3 VAD implant 02/26/19 - Echo 7/21 EF 40-45% - VAD interrogated personally. Parameters stable. - MAP 90s  - Increase coreg 9.375 mg twice a day. .   - Continue losartan - LDH 203  - INR 1.1 continue heparin. Back on warfarin Discussed dosing with PharmD personally.  3. Anemia - Baseline hgb has been upper 9's, admission hgb 7.7 => 6.1.  However, hgb up to 10.5 after 2 units PRCs and stool was heme negative. - no further bleeding at DL site - Hgb 9.2 -> 8.9 -> 9.3->8.7   4. F/E/N - We continue to worry about his nutritional status after his mother died. - Appetite has been great in the hospital.  5. Broken Teeth - 3/31 s/p extractions of teeth #5 and 15  - Appreciate dental surgery  - site looks good  6. Hypokalemia/ Hypomagnesemia  - Mg 1.7, K 3.7  - Supp K and Mag.   INR remains sub  therapeutics. No plan for dishcarge until INR up and wound around driveline stable.  Length of Stay: 20  Jon Becket, NP 05/18/2020, 12:08 PM  VAD Team --- VAD ISSUES ONLY--- Pager [phone] (7am - 7am)  Advanced Heart Failure Team  Pager [phone] (M-F; 7a - 5p)  Please contact [facility] for night-coverage after hours (5p -7a ) and weekends on  [email or web address]  Patient seen and examined with the above-signed Advanced Practice Provider and/or Housestaff. I personally reviewed laboratory data, imaging studies and relevant notes. I independently examined the patient and formulated the important aspects of the plan. I have edited the note to reflect any of my changes or salient points. I have personally discussed the plan with the patient and/or family.  Denies fevers or chills. No bleeding from wound. Remains on IV abx. Volume status ok  On heparin/warfarin INR 1.1   General:  NAD.  HEENT: normal  Neck: supple. JVP not elevated.  Carotids 2+ bilat; no bruits. No lymphadenopathy or thryomegaly appreciated. Cor: LVAD hum.  Lungs: Clear. Abdomen: soft, nontender, non-distended. No hepatosplenomegaly. No bruits or masses. Good bowel sounds. Driveline site with wound vac in place Anchor in place.  Extremities: no cyanosis, clubbing, rash. Warm no edema  Neuro: alert & oriented x 3. No focal deficits. Moves all 4 without problem   Wound site stable. Plan for vac change tomorrow continue IV abx. Continue heparin. Load warfarin Discussed dosing with PharmD personally.  Arvilla Meres, MD  12:26 PM

## 2020-05-18 NOTE — Progress Notes (Signed)
ANTICOAGULATION CONSULT NOTE  Pharmacy Consult for heparin/coumadin Indication: LVAD  No Known Allergies  Patient Measurements: Height: 5\' 5"  (165.1 cm) Weight: 56.4 kg (124 lb 5.4 oz) IBW/kg (Calculated) : 61.5  Heparin dosing weight: 55.2 kg  Vital Signs: Temp: 98.6 F (37 C) (04/14 0724) Temp Source: Oral (04/14 0724) BP: 97/78 (04/14 0724) Pulse Rate: 81 (04/14 0415)  Labs: Recent Labs    05/16/20 0112 05/17/20 0224 05/17/20 1131 05/18/20 0033  HGB 9.3* 8.3*  --  8.7*  HCT 29.3* 26.4*  --  27.8*  PLT 265 217  --  216  LABPROT 14.2 14.7  --  13.8  INR 1.1 1.2  --  1.1  HEPARINUNFRC 0.23* 0.11* 0.18* 0.23*  CREATININE 0.79 0.78  --  0.82    Estimated Creatinine Clearance: 101.3 mL/min (by C-G formula based on SCr of 0.82 mg/dL).   Medical History: Past Medical History:  Diagnosis Date  . Dilated cardiomyopathy (HCC)   . Dilated cardiomyopathy (HCC) 02/2019  . Polysubstance abuse (HCC)     Assessment: 55 yom presenting with concern for driveline infection - hx HM3 implanted on 02/26/19 - on warfarin PTA. PTA regimen is 2.5 mg daily. He is now s/p dental extractions on 3/31.  S/p OR for wound + wound vac.  INR 1.1 today, heparin level slightly below goal at 0.23.  No overt bleeding or complications noted, Hgb 8.3 > 8.7, Pltc ok.  Goal of Therapy:  Heparin level ~0.3 INR 2-2.5 Monitor platelets by anticoagulation protocol: Yes   Plan:  Increase heparin slightly to 1000 units/hr.  Recheck heparin level with AM labs. Coumadin 7.5 mg x 1 tonight.  Aiming for INR no higher than 2 given wound vac change planned for Friday. AM CBC, protime and heparin level.  Monday, Reece Leader, [facility] Clinical Pharmacist  05/18/2020 11:27 AM   [facility] pharmacy phone numbers are listed on [email or web address]

## 2020-05-18 NOTE — Progress Notes (Signed)
[facility]  Date of Admission:  04/28/2020     Total days of antibiotics 6         ASSESSMENT:  Jon Martin is POD #2 from I&D of MSSA drive line exit site infection with subcutaneous abscess. Surgical specimen growing Staphyloccous aureus with sensitivities pending. Planned wound VAC change tomorrow at bedside. Will need 4 weeks of therapy with end date to be determined pending any additional need for I&D. Per Heart Failure team notes he can't go home with PICC line for home antibiotics. Can consider long acting oritivancin with another outpatient dose vs. going to SNF for treatment.   PLAN:  1. Continue current dose of cefazolin.  2. Wound VAC dressing change planned for tomorrow.  3. Final recommendations and plans pending disposition and any additional need for I&D.   Principal Problem:   Infection associated with driveline of left ventricular assist device (LVAD) (HCC) Active Problems:   Chronic systolic heart failure (HCC)   Left ventricular assist device (LVAD) complication   Complication involving left ventricular assist device (LVAD)   Dental caries   Retained dental root   Chronic periodontitis   Loss of teeth due to extraction   . carvedilol  9.375 mg Oral BID WC  . losartan  25 mg Oral Daily  . pantoprazole  40 mg Oral Daily  . sodium chloride flush  3 mL Intravenous Q12H  . warfarin  7.5 mg Oral ONCE-1600  . Warfarin - Pharmacist Dosing Inpatient   Does not apply q1600    SUBJECTIVE:  Afebrile overnight with no acute events. No new concerns/complaints.   No Known Allergies   Review of Systems: Review of Systems  Constitutional: Negative for chills, fever and weight loss.  Respiratory: Negative for cough, shortness of breath and wheezing.   Cardiovascular: Negative for chest pain and leg swelling.  Gastrointestinal: Negative for abdominal pain, constipation, diarrhea, nausea and vomiting.  Skin: Negative for rash.       OBJECTIVE: Vitals:   05/18/20 0415 05/18/20 0724 05/18/20 0745 05/18/20 1249  BP: 93/75 97/78    Pulse: 81     Resp: 19 17 19    Temp: 99.2 F (37.3 C) 98.6 F (37 C)  98.6 F (37 C)  TempSrc: Oral Oral  Oral  SpO2: 95%     Weight: 56.4 kg     Height:       Body mass index is 20.69 kg/m.  Physical Exam Constitutional:      General: He is not in acute distress.    Appearance: He is well-developed.     Comments: Seated on the bed; pleasant.   Cardiovascular:     Rate and Rhythm: Normal rate and regular rhythm.     Comments: LVAD hum Pulmonary:     Effort: Pulmonary effort is normal.     Breath sounds: Normal breath sounds.  Skin:    General: Skin is warm and dry.  Neurological:     Mental Status: He is alert and oriented to person, place, and time.  Psychiatric:        Mood and Affect: Mood normal.     Lab Results Lab Results  Component Value Date   WBC 7.9 05/18/2020   HGB 8.7 (L) 05/18/2020   HCT 27.8 (L) 05/18/2020   MCV 96.9 05/18/2020   PLT 216 05/18/2020    Lab Results  Component Value Date   CREATININE 0.82 05/18/2020   BUN 7 05/18/2020   NA  136 05/18/2020   K 4.0 05/18/2020   CL 103 05/18/2020   CO2 22 05/18/2020    Lab Results  Component Value Date   ALT 39 05/14/2020   AST 54 (H) 05/14/2020   ALKPHOS 116 05/14/2020   BILITOT 0.6 05/14/2020     Microbiology: Recent Results (from the past 240 hour(s))  Aerobic Culture w Gram Stain (superficial specimen)     Status: None   Collection Time: 05/09/20  9:47 AM   Specimen: Abscess; Wound  Result Value Ref Range Status   Specimen Description ABSCESS  Final   Special Requests NONE  Final   Gram Stain   Final    RARE WBC PRESENT,BOTH PMN AND MONONUCLEAR RARE GRAM POSITIVE COCCI IN PAIRS IN CHAINS Performed at [facility], [address], [place], [place] [address]    Culture RARE STAPHYLOCOCCUS AUREUS  Final   Report Status 05/11/2020 FINAL  Final   Organism ID, Bacteria  STAPHYLOCOCCUS AUREUS  Final      Susceptibility   Staphylococcus aureus - MIC*    CIPROFLOXACIN 1 SENSITIVE Sensitive     ERYTHROMYCIN >=8 RESISTANT Resistant     GENTAMICIN <=0.5 SENSITIVE Sensitive     OXACILLIN <=0.25 SENSITIVE Sensitive     TETRACYCLINE <=1 SENSITIVE Sensitive     VANCOMYCIN <=0.5 SENSITIVE Sensitive     TRIMETH/SULFA <=10 SENSITIVE Sensitive     CLINDAMYCIN RESISTANT Resistant     RIFAMPIN <=0.5 SENSITIVE Sensitive     Inducible Clindamycin POSITIVE Resistant     * RARE STAPHYLOCOCCUS AUREUS  Surgical pcr screen     Status: None   Collection Time: 05/15/20  5:36 PM   Specimen: Nasal Mucosa; Nasal Swab  Result Value Ref Range Status   MRSA, PCR NEGATIVE NEGATIVE Final   Staphylococcus aureus NEGATIVE NEGATIVE Final    Comment: (NOTE) The Xpert SA Assay (FDA approved for NASAL specimens in patients 7 years of age and older), is one component of a comprehensive surveillance program. It is not intended to diagnose infection nor to guide or monitor treatment. Performed at [facility], [address], [place], [place] [address]   Aerobic/Anaerobic Culture w Gram Stain (surgical/deep wound)     Status: None (Preliminary result)   Collection Time: 05/16/20 12:10 PM   Specimen: Abdomen; Wound  Result Value Ref Range Status   Specimen Description WOUND ABDOMEN  Final   Special Requests PT ON ANCEF  Final   Gram Stain   Final    MODERATE WBC PRESENT, PREDOMINANTLY PMN NO ORGANISMS SEEN    Culture   Final    RARE STAPHYLOCOCCUS AUREUS SUSCEPTIBILITIES TO FOLLOW Performed at [facility], [address], [place], [place] [address]    Report Status PENDING  Incomplete     Marcos Eke, NP [facility]  05/18/2020  12:54 PM

## 2020-05-18 NOTE — Progress Notes (Addendum)
Initial Nutrition Assessment  DOCUMENTATION CODES:   Non-severe (moderate) malnutrition in context of chronic illness  INTERVENTION:   Liberalize diet   Magic cup TID with meals, each supplement provides 290 kcal and 9 grams of protein  ProSource Plus 30 ml BID, each supplement provides 100 kcals and 15 grams protein.   MVI daily   NUTRITION DIAGNOSIS:   Moderate Malnutrition related to chronic illness (CHF s/p LVAD, depression) as evidenced by mild fat depletion,moderate muscle depletion.  GOAL:   Patient will meet greater than or equal to 90% of their needs  MONITOR:   PO intake,Supplement acceptance,Weight trends,Labs,I & O's  REASON FOR ASSESSMENT:   Other (Comment) (Driveline complications)    ASSESSMENT:   Patient with PMH significant for CHF s/p HMIII implant January 2021 and prior polysubstance use. Presents this admission with driveline infection due to trauma.   Patient endorses decreased intake after his mother's passing. States over the past few months this has improved. Sometimes he will have periods throughout the day when he thinks of his mother and will skip meals. He typically eats 2 or 3 meals daily that consist of B- bacon, pancakes, waffles L- roast beef microwave dinner or hot pockets D- baked pork chops or $1 pizza. Although malnutrition status has improved since LVAD implantation he continues to meet criteria for moderate malnutrition. Patient is consuming multiple high sodium options which he attributes to stress and less cook time. Would not want to place extreme restriction given history of poor intake but also would not want to contribute to worsening edema. Patient also enjoys Boost supplements but stopped drinking them when advised about Vitamin K content. Attempted to educate patient that Vitamin K intake needs to remain consist throughout days vs eliminating Vitamin K containing food items. Patient would benefit from further education with support  person once weight stabilizes and he is no longer malnourished.   Patient unsure of UBW. Reports he lost an unknown amount of weight around 6 months ago bu has been slowly gaining it back. Records reflect patient weighed 53.2 kg on 11/19/19 and 55.7 kg this admission.   UOP: 2250 ml x 24 hrs   Medications: reviewed  Labs: reviewed   NUTRITION - FOCUSED PHYSICAL EXAM:  Flowsheet Row Most Recent Value  Orbital Region Mild depletion  Upper Arm Region Mild depletion  Thoracic and Lumbar Region Unable to assess  Buccal Region No depletion  Temple Region No depletion  Clavicle Bone Region Mild depletion  Clavicle and Acromion Bone Region No depletion  Scapular Bone Region Unable to assess  Dorsal Hand No depletion  Patellar Region Moderate depletion  Anterior Thigh Region Moderate depletion  Posterior Calf Region Moderate depletion  Edema (RD Assessment) Mild  Hair Reviewed  Eyes Reviewed  Mouth Reviewed  Skin Reviewed  Nails Reviewed     Diet Order:   Diet Order            Diet heart healthy/carb modified Room service appropriate? Yes; Fluid consistency: Thin  Diet effective now                 EDUCATION NEEDS:   Education needs have been addressed  Skin:  Skin Assessment: Skin Integrity Issues: Skin Integrity Issues:: Incisions Incisions: lip, L abdomen  Last BM:  PTA  Height:   Ht Readings from Last 1 Encounters:  05/04/20 5\' 5"  (1.651 m)    Weight:   Wt Readings from Last 1 Encounters:  05/18/20 56.4 kg    BMI:  Body mass index is 20.69 kg/m.  Estimated Nutritional Needs:   Kcal:  2000-2200 kcal  Protein:  100-120 grams  Fluid:  >/= 2 L/day  Vanessa Kick RD, LDN Clinical Nutrition Pager listed in AMION

## 2020-05-19 DIAGNOSIS — T827XXA Infection and inflammatory reaction due to other cardiac and vascular devices, implants and grafts, initial encounter: Secondary | ICD-10-CM | POA: Diagnosis not present

## 2020-05-19 DIAGNOSIS — K0530 Chronic periodontitis, unspecified: Secondary | ICD-10-CM

## 2020-05-19 DIAGNOSIS — K08199 Complete loss of teeth due to other specified cause, unspecified class: Secondary | ICD-10-CM | POA: Diagnosis not present

## 2020-05-19 DIAGNOSIS — B9561 Methicillin susceptible Staphylococcus aureus infection as the cause of diseases classified elsewhere: Secondary | ICD-10-CM | POA: Diagnosis not present

## 2020-05-19 DIAGNOSIS — I5022 Chronic systolic (congestive) heart failure: Secondary | ICD-10-CM | POA: Diagnosis not present

## 2020-05-19 LAB — CBC
HCT: 28.9 % — ABNORMAL LOW (ref 39.0–52.0)
Hemoglobin: 9.1 g/dL — ABNORMAL LOW (ref 13.0–17.0)
MCH: 30.1 pg (ref 26.0–34.0)
MCHC: 31.5 g/dL (ref 30.0–36.0)
MCV: 95.7 fL (ref 80.0–100.0)
Platelets: 221 10*3/uL (ref 150–400)
RBC: 3.02 MIL/uL — ABNORMAL LOW (ref 4.22–5.81)
RDW: 20.8 % — ABNORMAL HIGH (ref 11.5–15.5)
WBC: 8.1 10*3/uL (ref 4.0–10.5)
nRBC: 0 % (ref 0.0–0.2)

## 2020-05-19 LAB — BASIC METABOLIC PANEL
Anion gap: 8 (ref 5–15)
BUN: 9 mg/dL (ref 6–20)
CO2: 28 mmol/L (ref 22–32)
Calcium: 9.9 mg/dL (ref 8.9–10.3)
Chloride: 100 mmol/L (ref 98–111)
Creatinine, Ser: 0.77 mg/dL (ref 0.61–1.24)
GFR, Estimated: >60 mL/min (ref >60)
Glucose, Bld: 126 mg/dL — ABNORMAL HIGH (ref 70–99)
Potassium: 3.7 mmol/L (ref 3.5–5.1)
Sodium: 136 mmol/L (ref 135–145)

## 2020-05-19 LAB — HEPARIN LEVEL (UNFRACTIONATED): Heparin Unfractionated: 0.3 IU/mL (ref 0.30–0.70)

## 2020-05-19 LAB — PROTIME-INR
INR: 1 (ref 0.8–1.2)
Prothrombin Time: 13.6 seconds (ref 11.4–15.2)

## 2020-05-19 LAB — MAGNESIUM: Magnesium: 1.9 mg/dL (ref 1.7–2.4)

## 2020-05-19 LAB — LACTATE DEHYDROGENASE: LDH: 201 U/L — ABNORMAL HIGH (ref 98–192)

## 2020-05-19 MED ORDER — POTASSIUM CHLORIDE CRYS ER 20 MEQ PO TBCR
40.0000 meq | EXTENDED_RELEASE_TABLET | Freq: Once | ORAL | Status: AC
  Administered 2020-05-19: 40 meq via ORAL
  Filled 2020-05-19: qty 2

## 2020-05-19 MED ORDER — WARFARIN SODIUM 10 MG PO TABS
10.0000 mg | ORAL_TABLET | Freq: Once | ORAL | Status: AC
  Administered 2020-05-19: 10 mg via ORAL
  Filled 2020-05-19: qty 1

## 2020-05-19 MED ORDER — MAGNESIUM SULFATE 2 GM/50ML IV SOLN
2.0000 g | Freq: Once | INTRAVENOUS | Status: AC
  Administered 2020-05-19: 2 g via INTRAVENOUS
  Filled 2020-05-19: qty 50

## 2020-05-19 NOTE — Progress Notes (Signed)
ANTICOAGULATION CONSULT NOTE  Pharmacy Consult for heparin/coumadin Indication: LVAD  No Known Allergies  Patient Measurements: Height: 5\' 5"  (165.1 cm) Weight: 56.4 kg (124 lb 5.4 oz) IBW/kg (Calculated) : 61.5  Heparin dosing weight: 55.2 kg  Vital Signs: Temp: 98.5 F (36.9 C) (04/15 0730) Temp Source: Oral (04/15 0730) BP: 95/79 (04/15 0730) Pulse Rate: 90 (04/15 0423)  Labs: Recent Labs    05/17/20 0224 05/17/20 1131 05/18/20 0033 05/19/20 0139 05/19/20 0145  HGB 8.3*  --  8.7* 9.1*  --   HCT 26.4*  --  27.8* 28.9*  --   PLT 217  --  216 221  --   LABPROT 14.7  --  13.8 13.6  --   INR 1.2  --  1.1 1.0  --   HEPARINUNFRC 0.11* 0.18* 0.23*  --  0.30  CREATININE 0.78  --  0.82 0.77  --     Estimated Creatinine Clearance: 103.8 mL/min (by C-G formula based on SCr of 0.77 mg/dL).   Medical History: Past Medical History:  Diagnosis Date  . Dilated cardiomyopathy (HCC)   . Dilated cardiomyopathy (HCC) 02/2019  . Polysubstance abuse (HCC)     Assessment: 36 yom presenting with concern for driveline infection - hx HM3 implanted on 02/26/19 - on warfarin PTA. PTA regimen is 2.5 mg daily. He is now s/p dental extractions on 3/31.  S/p OR for wound + wound vac.  INR 1 today, heparin level at goal 0.3.  No overt bleeding or complications noted, Hgb 8.3 > 9.1, Pltc ok.  Goal of Therapy:  Heparin level ~0.3 INR 2-2.5 Monitor platelets by anticoagulation protocol: Yes   Plan:  Increase heparin slightly to 1000 units/hr.  Recheck heparin level with AM labs. Coumadin 10 mg x 1 tonight.  Aiming for INR no higher than 2 for now until interventions on wound complete. AM CBC, protime and heparin level.  4/31, Reece Leader, BCCP Clinical Pharmacist  05/19/2020 8:48 AM   [facility] pharmacy phone numbers are listed on [email or web address]

## 2020-05-19 NOTE — Plan of Care (Signed)
?  Problem: Education: ?Goal: Knowledge of General Education information will improve ?Description: Including pain rating scale, medication(s)/side effects and non-pharmacologic comfort measures ?Outcome: Progressing ?  ?Problem: Health Behavior/Discharge Planning: ?Goal: Ability to manage health-related needs will improve ?Outcome: Progressing ?  ?Problem: Clinical Measurements: ?Goal: Ability to maintain clinical measurements within normal limits will improve ?Outcome: Progressing ?Goal: Will remain free from infection ?Outcome: Progressing ?Goal: Diagnostic test results will improve ?Outcome: Progressing ?Goal: Respiratory complications will improve ?Outcome: Progressing ?Goal: Cardiovascular complication will be avoided ?Outcome: Progressing ?  ?Problem: Activity: ?Goal: Risk for activity intolerance will decrease ?Outcome: Progressing ?  ?Problem: Nutrition: ?Goal: Adequate nutrition will be maintained ?Outcome: Progressing ?  ?Problem: Coping: ?Goal: Level of anxiety will decrease ?Outcome: Progressing ?  ?Problem: Elimination: ?Goal: Will not experience complications related to bowel motility ?Outcome: Progressing ?Goal: Will not experience complications related to urinary retention ?Outcome: Progressing ?  ?Problem: Pain Managment: ?Goal: General experience of comfort will improve ?Outcome: Progressing ?  ?Problem: Safety: ?Goal: Ability to remain free from injury will improve ?Outcome: Progressing ?  ?Problem: Skin Integrity: ?Goal: Risk for impaired skin integrity will decrease ?Outcome: Progressing ?  ?Problem: Education: ?Goal: Patient will understand all VAD equipment and how it functions ?Outcome: Progressing ?Goal: Patient will be able to verbalize current INR target range and antiplatelet therapy for discharge home ?Outcome: Progressing ?  ?Problem: Cardiac: ?Goal: LVAD will function as expected and patient will experience no clinical alarms ?Outcome: Progressing ?  ?

## 2020-05-19 NOTE — Progress Notes (Signed)
PHARMACY CONSULT NOTE FOR:  OUTPATIENT  PARENTERAL ANTIBIOTIC THERAPY (OPAT)  Indication: MSSA driveline infection Regimen: Cefazolin 2 gm IV Q 8 hours  End date: 06/13/20  Only if patient discharged to a skilled nursing facility. If patient to discharge home, please contact ID and we will plan on long-acting oritavancin instead.   IV antibiotic discharge orders are pended. To discharging provider:  please sign these orders via discharge navigator,  Select New Orders & click on the button choice - Manage This Unsigned Work.     Thank you for allowing pharmacy to be a part of this patient's care.  Sharin Mons, PharmD, BCPS, BCIDP Infectious Diseases Clinical Pharmacist Phone: [phone] 05/19/2020, 1:10 PM

## 2020-05-19 NOTE — Progress Notes (Signed)
[facility]  Date of Admission:  04/28/2020     Total days of antibiotics 7         ASSESSMENT:  [name] is POD #3 s/p I&D for MSSA LVAD drive line exit site infection with abscess. Wound VAC changed today. Surgical culture growing Staphyloccous aureus with sensitivity pending. Continue current dose of Cefazolin. Will need 4 weeks once source control has been achieved. Disposition to be determined and we can adjust antibiotic therapy pending disposition.   PLAN:  1. Continue Cefazolin. 2. Monitor cultures for organism sensitivities.  3. Wound care per CVTS.  4. Will need 4 weeks of therapy with final recommendations pending disposition and source control.   Dr. Elinor Parkinson is available over the weekend for ID concerns/questions.   Principal Problem:   Infection associated with driveline of left ventricular assist device (LVAD) (HCC) Active Problems:   Chronic systolic heart failure (HCC)   Left ventricular assist device (LVAD) complication   Complication involving left ventricular assist device (LVAD)   Dental caries   Retained dental root   Chronic periodontitis   Loss of teeth due to extraction   MSSA (methicillin susceptible Staphylococcus aureus) infection   . (feeding supplement) PROSource Plus  30 mL Oral BID BM  . carvedilol  9.375 mg Oral BID WC  . losartan  25 mg Oral Daily  . multivitamin with minerals  1 tablet Oral Daily  . pantoprazole  40 mg Oral Daily  . sodium chloride flush  3 mL Intravenous Q12H  . warfarin  10 mg Oral ONCE-1600  . Warfarin - Pharmacist Dosing Inpatient   Does not apply q1600    SUBJECTIVE:  Afebrile overnight with no acute events. Wound VAC dressing changed by LVAD team. No new concerns/complaints. Denies fevers/chills.   No Known Allergies   Review of Systems: Review of Systems  Constitutional: Negative for chills, fever and weight loss.  Respiratory: Negative for cough, shortness of breath and  wheezing.   Cardiovascular: Negative for chest pain and leg swelling.  Gastrointestinal: Negative for abdominal pain, constipation, diarrhea, nausea and vomiting.  Skin: Negative for rash.      OBJECTIVE: Vitals:   05/18/20 2329 05/19/20 0423 05/19/20 0519 05/19/20 0730  BP: (!) 86/71 92/71  95/79  Pulse: 79 90    Resp: 16 15  16   Temp: 98 F (36.7 C) 98.2 F (36.8 C)  98.5 F (36.9 C)  TempSrc: Oral Oral  Oral  SpO2: 97% 96%    Weight:   56.4 kg   Height:       Body mass index is 20.69 kg/m.  Physical Exam Constitutional:      General: He is not in acute distress.    Appearance: He is well-developed.     Comments: Lying in bed with head of bed elevated; pleasant.   Cardiovascular:     Rate and Rhythm: Normal rate and regular rhythm.     Comments: LVAD hum present.  Pulmonary:     Effort: Pulmonary effort is normal.     Breath sounds: Normal breath sounds.  Skin:    General: Skin is warm and dry.  Neurological:     Mental Status: He is alert and oriented to person, place, and time.  Psychiatric:        Behavior: Behavior normal.        Thought Content: Thought content normal.        Judgment: Judgment normal.     Lab  Results Lab Results  Component Value Date   WBC 8.1 05/19/2020   HGB 9.1 (L) 05/19/2020   HCT 28.9 (L) 05/19/2020   MCV 95.7 05/19/2020   PLT 221 05/19/2020    Lab Results  Component Value Date   CREATININE 0.77 05/19/2020   BUN 9 05/19/2020   NA 136 05/19/2020   K 3.7 05/19/2020   CL 100 05/19/2020   CO2 28 05/19/2020    Lab Results  Component Value Date   ALT 39 05/14/2020   AST 54 (H) 05/14/2020   ALKPHOS 116 05/14/2020   BILITOT 0.6 05/14/2020     Microbiology: Recent Results (from the past 240 hour(s))  Surgical pcr screen     Status: None   Collection Time: 05/15/20  5:36 PM   Specimen: Nasal Mucosa; Nasal Swab  Result Value Ref Range Status   MRSA, PCR NEGATIVE NEGATIVE Final   Staphylococcus aureus NEGATIVE NEGATIVE  Final    Comment: (NOTE) The Xpert SA Assay (FDA approved for NASAL specimens in patients 57 years of age and older), is one component of a comprehensive surveillance program. It is not intended to diagnose infection nor to guide or monitor treatment. Performed at [facility], [address], [place], [place] [address]   Aerobic/Anaerobic Culture w Gram Stain (surgical/deep wound)     Status: None (Preliminary result)   Collection Time: 05/16/20 12:10 PM   Specimen: Abdomen; Wound  Result Value Ref Range Status   Specimen Description WOUND ABDOMEN  Final   Special Requests PT ON ANCEF  Final   Gram Stain   Final    MODERATE WBC PRESENT, PREDOMINANTLY PMN NO ORGANISMS SEEN Performed at [facility], [address], [place], [place] [address]    Culture   Final    RARE STAPHYLOCOCCUS AUREUS SUSCEPTIBILITIES TO FOLLOW NO ANAEROBES ISOLATED; CULTURE IN PROGRESS FOR 5 DAYS    Report Status PENDING  Incomplete     Marcos Eke, NP [facility]  05/19/2020  11:49 AM

## 2020-05-19 NOTE — Progress Notes (Addendum)
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   HM III LVAD implanted on 02/26/19 by Dr. Maren Beach under Destination Therapy criteria.  Patient awake, alert, watching videos on phone. Denies complaints. Wants to go home. Made aware INR 1.0 today and needs close observation of drive line wound with wound vac changes. He verbalized understanding.     Wound vac and drive line dressing change as documented below.   Vital signs: Temp: 98.5 HR: 87 Doppler Pressure: 90 Automatic BP:  95/79 (86) O2 Sat: 97% RA  Wt: 122.8>123>124.1>120.8>121.4>121.6.......123.4>124.7>124.3 lbs   LVAD interrogation reveals:  Speed: 5400 Flow: 3.9 Power: 3.8w PI: 4.4 Hct: 20 - do not change  Alarms: none Events: 100+ PI events yesterday; 36 so far today Fixed speed: 5400 Low speed limit: 5100  Drive Line: Existing VAD dressing clean, dry, intact. Existing VAD dressing removed and site care performed using sterile technique. Drive line exit site cleaned with Chlora prep applicators x 2, RINSED WITH SALINE, allowed to dry, and gauze dressing with silver strip re-applied. Exit site unincorporated, the velour is fully implanted at exit site. No tenderness, redness, drainage, or odor from site. Pt has dark dried skin rash in the surrounding area of the anchor and the skin is excoriated. Drive line anchor re-applied.  Abdominal Abcsess: Wound vac in place with small amount bloody drainage. Wound vac changed using sterile technique by myself and Orson Eva RN. Existing vac dressing and black sponge removed. Drive line exit care as above. Cleansed around incision opening with CHG swab x 2, RINSED WITH SALINE, and allowed to dry. Small black sponge placed in wound bed and covered with clear vac dressing. Good seal achieved. Placed wound vac tubing in anchor for stability.      Labs:  LDH trend: 181>171........212>220>198>203>201  INR trend:  2.3>1.7>1.2>1.1.......1.2>1.1  Anticoagulation Plan: -INR Goal: 2.0 - 2.5 -ASA Dose: none  Blood Products:  - 04/29/20>>2 units PCs  Device: N/A  Infection:  - 04/28/20 BCs>> No growth final - 04/28/20 DL culture>>rare staph aureus; final  - 05/04/20 BC>> No growth, final - 05/09/20 Abdominal abscess culture>> Staph aureus  Drips:  Heparin @ 1000 units/hr  Plan/Recommendations:  1. Call VAD Coordinator if any VAD equipment or drive line issues. 2. Weekly VAD drive line dressing changes or as needed with wound VAC change.    Alyce Pagan RN VAD Coordinator  Office: [phone]  24/7 Pager: [phone]

## 2020-05-19 NOTE — Progress Notes (Signed)
CSW met with patient at bedside. Patient watching TV and states he would really like to go home but verbalizes that it is not an option at the moment. Patient reports he has communicated with his family and denies any concerns at this time. CSW will continue to follow to arrange transportation for return to clinic post discharge. [name], [place], [place]

## 2020-05-19 NOTE — Progress Notes (Addendum)
Patient ID: Jon Martin, male   DOB: Dec 31, 1985, 35 y.o.   MRN: [id number]   Advanced Heart Failure VAD Team Note  PCP-Cardiologist: No primary care provider on file.   Subjective:     HM3 LVAD admitted w/ Drive-line infection.  - Wound culture with S aureus. Blood cx NGTD - 3/26 abx narrowed to cefazolin 2gm IV Q 8hr. ID following  - 3/28 CT abd/pelvis soft tissue no deep infection. Oral Surgeon consulted.  - 3/31 s/p extractions of teeth #5 and 15  - 3/31 Low flow alarms last night, got 1 L IVF and low flows have stopped - 4/5 small pustule on abdomen noted>> performed superficial I&D at bedside, no communication with DL tunnel, cultures sent and pending  - 4/8 Abscess at driveline site opened . CT abdomen done, abscess adjacent to driveline noted. - 4/13 I&D of driveline abscess  Repeat cultures from 4/12 still growing staph aureus. 4 weeks of cefazolin post most recent surgery on 05/16/2020  Wound vac changed today.   He is AF. WBC nl.   INR 1.0 Hgb 9.1  LDH 201   Sleeping, resting comfortably.    LVAD INTERROGATION:   HeartMate 3 LVAD:   Flow 4.1 liters/min, speed 5400, power 3.7  PI 3.9 multiple PI events VAD interrogated personally. Parameters stable.  Objective:    Vital Signs:   Temp:  [97.7 F (36.5 C)-98.6 F (37 C)] 98.5 F (36.9 C) (04/15 0730) Pulse Rate:  [79-94] 90 (04/15 0423) Resp:  [15-20] 16 (04/15 0730) BP: (86-103)/(71-84) 95/79 (04/15 0730) SpO2:  [96 %-97 %] 96 % (04/15 0423) Weight:  [56.4 kg] 56.4 kg (04/15 0519) Last BM Date: 05/17/20   Mean arterial Pressure 80-90s  Intake/Output:   Intake/Output Summary (Last 24 hours) at 05/19/2020 1244 Last data filed at 05/19/2020 1016 Gross per 24 hour  Intake 547.5 ml  Output 1475 ml  Net -927.5 ml     Physical Exam   Physical Exam: GENERAL: sleeping, resting comfortably, No acute distress. Normal breathing  HEENT: normal  NECK: Supple, JVP flat  .  2+ bilaterally, no bruits.  No  lymphadenopathy or thyromegaly appreciated.   CARDIAC:  Mechanical heart sounds with LVAD hum present.  LUNGS:  Clear to auscultation bilaterally. No wheezing  ABDOMEN:  Soft, round, nontender, positive bowel sounds x4.     LVAD exit site: VAC dressing in place around VAD.  EXTREMITIES:  Warm and dry, no cyanosis, clubbing, rash or edema. + RUE PICC  NEUROLOGIC:  Alert and oriented x 3.  No aphasia.  No dysarthria.  Affect pleasant.      Telemetry   NSR 80s personally reviewed.    Labs   Basic Metabolic Panel: Recent Labs  Lab 05/14/20 0022 05/15/20 0045 05/16/20 0112 05/17/20 0224 05/18/20 0033 05/19/20 0139  NA 137 137 136 135 136 136  K 3.6 3.7 4.0 3.7 4.0 3.7  CL 103 104 102 102 103 100  CO2 26 25 23 24 22 28   GLUCOSE 92 104* 107* 125* 99 126*  BUN 6 6 6 6 7 9   CREATININE 0.76 0.83 0.79 0.78 0.82 0.77  CALCIUM 9.2 9.5 9.4 9.3 9.6 9.9  MG 1.8 1.7 1.9 1.8 1.7 1.9  PHOS 3.7  --   --   --   --   --     Liver Function Tests: Recent Labs  Lab 05/14/20 0022  AST 54*  ALT 39  ALKPHOS 116  BILITOT 0.6  PROT 6.9  ALBUMIN  3.4*   No results for input(s): LIPASE, AMYLASE in the last 168 hours. No results for input(s): AMMONIA in the last 168 hours.  CBC: Recent Labs  Lab 05/14/20 0022 05/16/20 0112 05/17/20 0224 05/18/20 0033 05/19/20 0139  WBC 6.5 8.9 7.7 7.9 8.1  HGB 8.9* 9.3* 8.3* 8.7* 9.1*  HCT 28.2* 29.3* 26.4* 27.8* 28.9*  MCV 97.2 95.8 97.4 96.9 95.7  PLT 303 265 217 216 221    INR: Recent Labs  Lab 05/15/20 0045 05/16/20 0112 05/17/20 0224 05/18/20 0033 05/19/20 0139  INR 1.4* 1.1 1.2 1.1 1.0    Other results:    Imaging   No results found.   Medications:     Scheduled Medications: . (feeding supplement) PROSource Plus  30 mL Oral BID BM  . carvedilol  9.375 mg Oral BID WC  . losartan  25 mg Oral Daily  . multivitamin with minerals  1 tablet Oral Daily  . pantoprazole  40 mg Oral Daily  . sodium chloride flush  3 mL  Intravenous Q12H  . warfarin  10 mg Oral ONCE-1600  . Warfarin - Pharmacist Dosing Inpatient   Does not apply q1600    Infusions: . sodium chloride    .  ceFAZolin (ANCEF) IV 2 g (05/19/20 0526)  . heparin 1,000 Units/hr (05/19/20 0730)  . lactated ringers Stopped (05/16/20 1321)  . lactated ringers 10 mL/hr at 05/16/20 1136    PRN Medications: sodium chloride, acetaminophen, ondansetron (ZOFRAN) IV   Patient Profile   35 y/o male w/ chronic systolic HF s/p HM3 LVAD admitted w/ staph aureus drive-line infection   Assessment/Plan:    1. 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 -  Admitted for IV abx vancomycin/cefepime/Flagyl - Driveline culture with S aureus. Blood cx NGTD  - 3/27 abx narrowed to cefazolin 2gm IV Q 8hr => stopped 3/31 and got ortivancin 4/1  - CT abd/pelvis- soft tissue infection. No deep infection - 4/5 small pustule on abdomen noted >> performed superficial I&D at bedside, no communication with DL tunnel, cultures growing GPC.  -4/8 had open area at medial aspect communicates with driveline. Increased induration noted. This area was packed with 1/2 inch packing strip.  - Cefazolin restarted for total 4 weeks. He cant go home with PICC for home antibiotics. - CT with abscess adjacent to driveline.  - S/p surgical debridement in OR 4/12 by Dr. Laneta Simmers. Wound vac in place.  - Cultures from 4/12 still growing staph aureus  - per ID. Plan for 4 weeks of cefazolin post most recent surgery on 05/16/2020  - IF he cannot/will not be able to go to SNF, ID will try to arrange less optimal therapy  - Wound vac changed today    2.  Chronic Systolic HF due to NICM/VAD management - Echo EF 10% with biventricular failure - HM-3 VAD implant 02/26/19 - Echo 7/21 EF 40-45% - VAD interrogated personally. Parameters stable. - MAP 80s  - Continue coreg 9.375 mg twice a day.  - Continue losartan - LDH 201  - INR 1.0 continue  heparin. Back on warfarin Discussed dosing with PharmD personally.  3. Anemia - Baseline hgb has been upper 9's, admission hgb 7.7 => 6.1.  However, hgb up to 10.5 after 2 units PRCs and stool was heme negative. - no further bleeding at DL site - Hgb 9.2 -> 8.9 -> 9.3->8.7->9.1   4. F/E/N - We continue to worry about his nutritional  status after his mother died. - Appetite has been great in the hospital.  5. Broken Teeth - 3/31 s/p extractions of teeth #5 and 15  - Appreciate dental surgery  - site looks good  6. Hypokalemia/ Hypomagnesemia  - Mg 1.9, K 3.7  - Supp K and Mag.   INR remains sub therapeutic. No plan for dishcarge until INR up and wound around driveline stable.  Length of Stay: [address], PA-C 05/19/2020, 12:44 PM  VAD Team --- VAD ISSUES ONLY--- Pager [phone] (7am - 7am)  Advanced Heart Failure Team  Pager [phone] (M-F; 7a - 5p)  Please contact [facility] for night-coverage after hours (5p -7a ) and weekends on [email or web address]   Patient seen and examined with the above-signed Advanced Practice Provider and/or Housestaff. I personally reviewed laboratory data, imaging studies and relevant notes. I independently examined the patient and formulated the important aspects of the plan. I have edited the note to reflect any of my changes or salient points. I have personally discussed the plan with the patient and/or family.  Feels ok. Remains on IV abx. No problems with wound vac. No f/c. Volume status ok.on heaprin/warfarin. No bleeding INR 1.0. wound culture growing MSSA  General:  NAD.  HEENT: normal  Neck: supple. JVP not elevated.  Carotids 2+ bilat; no bruits. No lymphadenopathy or thryomegaly appreciated. Cor: LVAD hum.  Lungs: Clear. Abdomen: soft, nontender, non-distended. No hepatosplenomegaly. No bruits or masses. Good bowel sounds. Driveline site with wound vac in place . Anchor in place.  Extremities: no cyanosis, clubbing, rash. Warm no  edema  Neuro: alert & oriented x 3. No focal deficits. Moves all 4 without problem   Wound cx growing MSSA again. ID recommending 4 weeks ancef. Continue wound vac. Continue heparin/warfarin. INR 1.0  Discussed dosing with PharmD personally. VAD interrogated personally. Parameters stable.  Arvilla Meres, MD  5:56 PM

## 2020-05-20 DIAGNOSIS — T827XXA Infection and inflammatory reaction due to other cardiac and vascular devices, implants and grafts, initial encounter: Secondary | ICD-10-CM | POA: Diagnosis not present

## 2020-05-20 DIAGNOSIS — I5022 Chronic systolic (congestive) heart failure: Secondary | ICD-10-CM | POA: Diagnosis not present

## 2020-05-20 LAB — CBC
HCT: 27.5 % — ABNORMAL LOW (ref 39.0–52.0)
Hemoglobin: 8.8 g/dL — ABNORMAL LOW (ref 13.0–17.0)
MCH: 31.2 pg (ref 26.0–34.0)
MCHC: 32 g/dL (ref 30.0–36.0)
MCV: 97.5 fL (ref 80.0–100.0)
Platelets: 232 10*3/uL (ref 150–400)
RBC: 2.82 MIL/uL — ABNORMAL LOW (ref 4.22–5.81)
RDW: 21 % — ABNORMAL HIGH (ref 11.5–15.5)
WBC: 9.3 10*3/uL (ref 4.0–10.5)
nRBC: 0.2 % (ref 0.0–0.2)

## 2020-05-20 LAB — LACTATE DEHYDROGENASE: LDH: 194 U/L — ABNORMAL HIGH (ref 98–192)

## 2020-05-20 LAB — BASIC METABOLIC PANEL
Anion gap: 10 (ref 5–15)
BUN: 14 mg/dL (ref 6–20)
CO2: 25 mmol/L (ref 22–32)
Calcium: 9.9 mg/dL (ref 8.9–10.3)
Chloride: 102 mmol/L (ref 98–111)
Creatinine, Ser: 0.68 mg/dL (ref 0.61–1.24)
GFR, Estimated: >60 mL/min (ref >60)
Glucose, Bld: 119 mg/dL — ABNORMAL HIGH (ref 70–99)
Potassium: 4 mmol/L (ref 3.5–5.1)
Sodium: 137 mmol/L (ref 135–145)

## 2020-05-20 LAB — PROTIME-INR
INR: 1 (ref 0.8–1.2)
Prothrombin Time: 13.6 seconds (ref 11.4–15.2)

## 2020-05-20 LAB — MAGNESIUM: Magnesium: 1.9 mg/dL (ref 1.7–2.4)

## 2020-05-20 LAB — HEPARIN LEVEL (UNFRACTIONATED): Heparin Unfractionated: 0.3 IU/mL (ref 0.30–0.70)

## 2020-05-20 MED ORDER — WARFARIN SODIUM 10 MG PO TABS
10.0000 mg | ORAL_TABLET | Freq: Once | ORAL | Status: AC
  Administered 2020-05-20: 10 mg via ORAL
  Filled 2020-05-20: qty 1

## 2020-05-20 MED ORDER — MAGNESIUM SULFATE 2 GM/50ML IV SOLN
2.0000 g | Freq: Once | INTRAVENOUS | Status: AC
  Administered 2020-05-20: 2 g via INTRAVENOUS
  Filled 2020-05-20: qty 50

## 2020-05-20 NOTE — TOC Progression Note (Addendum)
Transition of Care [facility]) - Progression Note    Patient Details  Name: Jon Martin MRN: [id number] Date of Birth: 18-Aug-1985  Transition [facility]) CM/SW Contact  Leone Haven, RN Phone Number: 05/20/2020, 9:02 AM  Clinical Narrative:    Per pharmacy note, if patient goes to SNF he will continue on iv abx therapy, but if goes home they will have ID to give the oritavancin  Instead.  Will not go home on long term iv abx.  NCM made CSW aware of patient needing SNF for long term IV ABX.        Expected Discharge Plan and Services                                                 Social Determinants of Health (SDOH) Interventions    Readmission Risk Interventions No flowsheet data found.

## 2020-05-20 NOTE — Progress Notes (Signed)
Patient ID: Jon Martin, male   DOB: 11-03-1985, 35 y.o.   MRN: [id number]   Advanced Heart Failure VAD Team Note  PCP-Cardiologist: No primary care provider on file.   Subjective:     HM3 LVAD admitted w/ Drive-line infection.  - Wound culture with S aureus. Blood cx NGTD - 3/26 abx narrowed to cefazolin 2gm IV Q 8hr. ID following  - 3/28 CT abd/pelvis soft tissue no deep infection. Oral Surgeon consulted.  - 3/31 s/p extractions of teeth #5 and 15  - 3/31 Low flow alarms last night, got 1 L IVF and low flows have stopped - 4/5 small pustule on abdomen noted>> performed superficial I&D at bedside, no communication with DL tunnel, cultures sent and pending  - 4/8 Abscess at driveline site opened . CT abdomen done, abscess adjacent to driveline noted. - 4/13 I&D of driveline abscess - 4/15 Wound vac changed  Repeat cultures from 4/12 still growing staph aureus. 4 weeks of cefazolin post most recent surgery on 05/16/2020  Feels ok. Denies fevers, chills, orthopnea or PND. No pain at wound vac site.    LVAD INTERROGATION:   HeartMate 3 LVAD:   Flow 4.2 liters/min, speed 5400, power 4.0  PI 3.1 multiple PI events VAD interrogated personally. Parameters stable.  Objective:    Vital Signs:   Temp:  [98.1 F (36.7 C)-98.7 F (37.1 C)] 98.3 F (36.8 C) (04/16 0730) Pulse Rate:  [82-93] 93 (04/16 0336) Resp:  [14-20] 15 (04/16 0730) BP: (86-104)/(64-87) 104/87 (04/16 0730) SpO2:  [92 %-99 %] 99 % (04/16 0730) Weight:  [56.4 kg] 56.4 kg (04/16 0400) Last BM Date: 05/20/20   Mean arterial Pressure 80-90s   Intake/Output:   Intake/Output Summary (Last 24 hours) at 05/20/2020 1110 Last data filed at 05/20/2020 0800 Gross per 24 hour  Intake 1201.27 ml  Output 2230 ml  Net -1028.73 ml     Physical Exam   General:  NAD.  HEENT: normal  Neck: supple. JVP not elevated.  Carotids 2+ bilat; no bruits. No lymphadenopathy or thryomegaly appreciated. Cor: LVAD hum.  Lungs:  Clear. Abdomen: obese soft, nontender, non-distended. No hepatosplenomegaly. No bruits or masses. Good bowel sounds. Driveline site clean. Anchor in place. Wound vac in place  Extremities: no cyanosis, clubbing, rash. Warm no edema  Neuro: alert & oriented x 3. No focal deficits. Moves all 4 without problem    Telemetry   NSR 80-90s personally reviewed.    Labs   Basic Metabolic Panel: Recent Labs  Lab 05/14/20 0022 05/15/20 0045 05/16/20 0112 05/17/20 1914 05/18/20 0033 05/19/20 0139 05/20/20 0026 05/20/20 0439  NA 137   < > 136 135 136 136  --  137  K 3.6   < > 4.0 3.7 4.0 3.7  --  4.0  CL 103   < > 102 102 103 100  --  102  CO2 26   < > 23 24 22 28   --  25  GLUCOSE 92   < > 107* 125* 99 126*  --  119*  BUN 6   < > 6 6 7 9   --  14  CREATININE 0.76   < > 0.79 0.78 0.82 0.77  --  0.68  CALCIUM 9.2   < > 9.4 9.3 9.6 9.9  --  9.9  MG 1.8   < > 1.9 1.8 1.7 1.9 1.9  --   PHOS 3.7  --   --   --   --   --   --   --    < > =  values in this interval not displayed.    Liver Function Tests: Recent Labs  Lab 05/14/20 0022  AST 54*  ALT 39  ALKPHOS 116  BILITOT 0.6  PROT 6.9  ALBUMIN 3.4*   No results for input(s): LIPASE, AMYLASE in the last 168 hours. No results for input(s): AMMONIA in the last 168 hours.  CBC: Recent Labs  Lab 05/16/20 0112 05/17/20 0224 05/18/20 0033 05/19/20 0139 05/20/20 0026  WBC 8.9 7.7 7.9 8.1 9.3  HGB 9.3* 8.3* 8.7* 9.1* 8.8*  HCT 29.3* 26.4* 27.8* 28.9* 27.5*  MCV 95.8 97.4 96.9 95.7 97.5  PLT 265 217 216 221 232    INR: Recent Labs  Lab 05/16/20 0112 05/17/20 0224 05/18/20 0033 05/19/20 0139 05/20/20 0026  INR 1.1 1.2 1.1 1.0 1.0    Other results:    Imaging   No results found.   Medications:     Scheduled Medications: . (feeding supplement) PROSource Plus  30 mL Oral BID BM  . carvedilol  9.375 mg Oral BID WC  . losartan  25 mg Oral Daily  . multivitamin with minerals  1 tablet Oral Daily  . pantoprazole   40 mg Oral Daily  . sodium chloride flush  3 mL Intravenous Q12H  . Warfarin - Pharmacist Dosing Inpatient   Does not apply q1600    Infusions: . sodium chloride    .  ceFAZolin (ANCEF) IV 2 g (05/20/20 0620)  . heparin 1,000 Units/hr (05/20/20 0800)  . lactated ringers Stopped (05/16/20 1321)  . lactated ringers 10 mL/hr at 05/16/20 1136    PRN Medications: sodium chloride, acetaminophen, ondansetron (ZOFRAN) IV   Patient Profile   35 y/o male w/ chronic systolic HF s/p HM3 LVAD admitted w/ staph aureus drive-line infection   Assessment/Plan:    1. 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 -  Admitted for IV abx vancomycin/cefepime/Flagyl - Driveline culture with S aureus. Blood cx NGTD  - 3/27 abx narrowed to cefazolin 2gm IV Q 8hr => stopped 3/31 and got ortivancin 4/1  - CT abd/pelvis- soft tissue infection. No deep infection - 4/5 small pustule on abdomen noted >> performed superficial I&D at bedside, no communication with DL tunnel, cultures growing GPC.  -4/8 had open area at medial aspect communicates with driveline. Increased induration noted. This area was packed with 1/2 inch packing strip.  - Cefazolin restarted for total 4 weeks. He cant go home with PICC for home antibiotics. - CT with abscess adjacent to driveline.  - S/p surgical debridement in OR 4/12 by Dr. Laneta Simmers. Wound vac in place.  - Cultures from 4/12 still growing staph aureus  - per ID. Plan for 4 weeks of cefazolin post most recent surgery on 05/16/2020  - If he cannot/will not be able to go to SNF, ID will try to arrange less optimal therapy  - Wound vac changed 4/15 - I discussed placement with him and he would be willing to consider [facility] as his sister works there and his mom used to work there before she died. I am not sure they can handle VADs but will d/w SW on Monday   2.  Chronic Systolic HF due to NICM/VAD management - Echo EF 10% with  biventricular failure - HM-3 VAD implant 02/26/19 - Echo 7/21 EF 40-45% - VAD interrogated personally. Parameters stable. Still with frequent PIs but no low flows - MAP 80-90s  - Continue coreg 9.375 mg twice a  day.  - Continue losartan - LDH 194 - INR 1.0 continue heparin. Back on warfarin Discussed dosing with PharmD personally.  3. Anemia - Baseline hgb has been upper 9's, admission hgb 7.7 => 6.1.  However, hgb up to 10.5 after 2 units PRCs and stool was heme negative. - no further bleeding at DL site - Hgb 9.2 -> 8.9 -> 9.3->8.7->9.1 -> 8.8  4. F/E/N - We continue to worry about his nutritional status after his mother died. - Appetite has been great in the hospital.  5. Broken Teeth - 3/31 s/p extractions of teeth #5 and 15  - Appreciate dental surgery  - site looks good  6. Hypokalemia/ Hypomagnesemia  - Mg 1.9, K 4.0 - Supp Mag.    Length of Stay: 22  Arvilla Meres, MD 05/20/2020, 11:10 AM  VAD Team --- VAD ISSUES ONLY--- Pager [phone] (7am - 7am)  Advanced Heart Failure Team  Pager [phone] (M-F; 7a - 5p)  Please contact [facility] for night-coverage after hours (5p -7a ) and weekends on [email or web address]

## 2020-05-20 NOTE — Plan of Care (Signed)
?  Problem: Education: ?Goal: Knowledge of General Education information will improve ?Description: Including pain rating scale, medication(s)/side effects and non-pharmacologic comfort measures ?Outcome: Progressing ?  ?Problem: Health Behavior/Discharge Planning: ?Goal: Ability to manage health-related needs will improve ?Outcome: Progressing ?  ?Problem: Clinical Measurements: ?Goal: Ability to maintain clinical measurements within normal limits will improve ?Outcome: Progressing ?Goal: Will remain free from infection ?Outcome: Progressing ?Goal: Diagnostic test results will improve ?Outcome: Progressing ?Goal: Respiratory complications will improve ?Outcome: Progressing ?Goal: Cardiovascular complication will be avoided ?Outcome: Progressing ?  ?Problem: Activity: ?Goal: Risk for activity intolerance will decrease ?Outcome: Progressing ?  ?Problem: Nutrition: ?Goal: Adequate nutrition will be maintained ?Outcome: Progressing ?  ?Problem: Coping: ?Goal: Level of anxiety will decrease ?Outcome: Progressing ?  ?Problem: Elimination: ?Goal: Will not experience complications related to bowel motility ?Outcome: Progressing ?Goal: Will not experience complications related to urinary retention ?Outcome: Progressing ?  ?Problem: Pain Managment: ?Goal: General experience of comfort will improve ?Outcome: Progressing ?  ?Problem: Safety: ?Goal: Ability to remain free from injury will improve ?Outcome: Progressing ?  ?Problem: Skin Integrity: ?Goal: Risk for impaired skin integrity will decrease ?Outcome: Progressing ?  ?Problem: Education: ?Goal: Patient will understand all VAD equipment and how it functions ?Outcome: Progressing ?Goal: Patient will be able to verbalize current INR target range and antiplatelet therapy for discharge home ?Outcome: Progressing ?  ?Problem: Cardiac: ?Goal: LVAD will function as expected and patient will experience no clinical alarms ?Outcome: Progressing ?  ?

## 2020-05-20 NOTE — Progress Notes (Signed)
ANTICOAGULATION CONSULT NOTE  Pharmacy Consult for heparin/coumadin Indication: LVAD  No Known Allergies  Patient Measurements: Height: 5\' 5"  (165.1 cm) Weight: 56.4 kg (124 lb 5.4 oz) IBW/kg (Calculated) : 61.5  Heparin dosing weight: 55.2 kg  Vital Signs: Temp: 98.3 F (36.8 C) (04/16 0730) Temp Source: Oral (04/16 0730) BP: 104/87 (04/16 0730) Pulse Rate: 93 (04/16 0336)  Labs: Recent Labs    05/18/20 0033 05/19/20 0139 05/19/20 0145 05/20/20 0026 05/20/20 0439  HGB 8.7* 9.1*  --  8.8*  --   HCT 27.8* 28.9*  --  27.5*  --   PLT 216 221  --  232  --   LABPROT 13.8 13.6  --  13.6  --   INR 1.1 1.0  --  1.0  --   HEPARINUNFRC 0.23*  --  0.30 0.30  --   CREATININE 0.82 0.77  --   --  0.68    Estimated Creatinine Clearance: 103.8 mL/min (by C-G formula based on SCr of 0.68 mg/dL).   Medical History: Past Medical History:  Diagnosis Date  . Dilated cardiomyopathy (HCC)   . Dilated cardiomyopathy (HCC) 02/2019  . Polysubstance abuse (HCC)     Assessment: 50 yom presenting with concern for driveline infection - hx HM3 implanted on 02/26/19 - on warfarin PTA. PTA regimen is 2.5 mg daily. He is now s/p dental extractions on 3/31.  S/p OR for wound + wound vac.  INR 1 today despite increased doses, heparin level at goal 0.3 on heparin drip 1000uts/hr.  No overt bleeding or complications noted, Hgb stable 8-9s, Pltc ok.  Goal of Therapy:  Heparin level ~0.3 INR 2-2.5 Monitor platelets by anticoagulation protocol: Yes   Plan:  Continue heparin drip 1000 units/hr Coumadin 10 mg x 1 tonight. Aiming for INR no higher than 2 for now until interventions on wound complete. AM CBC, protime and heparin level.    4/31 Pharm.D. CPP, BCPS Clinical Pharmacist [phone] 05/20/2020 11:25 AM    [facility] pharmacy phone numbers are listed on [email or web address]

## 2020-05-20 NOTE — TOC Initial Note (Signed)
Transition of Care [facility]) - Initial/Assessment Note    Patient Details  Name: Jon Martin MRN: [id number] Date of Birth: [birth year]  Transition [facility] Care [facility]) CM/SW Contact:    Patrice Paradise, LCSW Phone Number: [phone] 05/20/2020, 1:52 PM  Clinical Narrative:     CSW spoke with pt via phone in regards to possibly going to a SNF to continue to the IV antibiotics. Patient states that he is agreeable as long as it is [facility] in [place]. He explains that he would like to be near his home. Pt has a LVAD as well which may be a barrier for a SNF placement.  CSW attempted to call [facility] in [place] at [phone] however was unable to reach anyone.  TOC team will continue to assist with discharge planning needs.                Expected Discharge Plan: Skilled Nursing Facility Barriers to Discharge: Continued Medical Work up,SNF Pending bed offer,Insurance Authorization   Patient Goals and CMS Choice        Expected Discharge Plan and Services Expected Discharge Plan: Skilled Nursing Facility                                              Prior Living Arrangements/Services   Lives with:: Self,Siblings Patient language and need for interpreter reviewed:: Yes        Need for Family Participation in Patient Care: Yes (Comment)        Activities of Daily Living Home Assistive Devices/Equipment: None ADL Screening (condition at time of admission) Patient's cognitive ability adequate to safely complete daily activities?: Yes Is the patient deaf or have difficulty hearing?: No Does the patient have difficulty seeing, even when wearing glasses/contacts?: No Does the patient have difficulty concentrating, remembering, or making decisions?: No Patient able to express need for assistance with ADLs?: Yes Does the patient have difficulty dressing or bathing?: No Independently performs ADLs?: Yes (appropriate for developmental age) Does the patient  have difficulty walking or climbing stairs?: No Weakness of Legs: None Weakness of Arms/Hands: None  Permission Sought/Granted      Share Information with NAME: [name]  Permission granted to share info w AGENCY: [facility]  Permission granted to share info w Relationship: Brother  Permission granted to share info w Contact Information: [phone]  Emotional Assessment   Attitude/Demeanor/Rapport: Unable to Assess Affect (typically observed): Unable to Assess Orientation: : Oriented to Self,Oriented to Place,Oriented to  Time,Oriented to Situation Alcohol / Substance Use: Alcohol Use,Illicit Drugs    Admission diagnosis:  Complication involving left ventricular assist device (LVAD) [B15.9XXA] Patient Active Problem List   Diagnosis Date Noted  . MSSA (methicillin susceptible Staphylococcus aureus) infection   . Loss of teeth due to extraction   . Retained dental root   . Chronic periodontitis   . Complication involving left ventricular assist device (LVAD) 04/28/2020  . Dental caries 04/28/2020  . 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/2020  . Left ventricular assist device (LVAD) complication 01/12/2020  . Dehydration, moderate 08/02/2019  . Chronic systolic heart failure (HCC) 08/02/2019  . AKI (acute kidney injury) (HCC) 08/02/2019  . Intractable vomiting with nausea 08/02/2019  . LVAD (left ventricular assist device) present (HCC) 08/02/2019  . Hypokalemia 08/02/2019  . Congestive heart  failure (HCC)   . Cardiogenic shock (HCC)   . Goals of care, counseling/discussion   . Preoperative evaluation to rule out surgical contraindication   . Protein-calorie malnutrition, severe 02/11/2019  . Dilated cardiomyopathy (HCC) 02/10/2019   PCP:  Nonah Mattes, MD Pharmacy:   [facility] [address] [place] - [address] [place] [address] Phone: [phone] Fax: [phone]     Social  Determinants of Health (SDOH) Interventions    Readmission Risk Interventions No flowsheet data found.

## 2020-05-20 NOTE — Plan of Care (Signed)

## 2020-05-21 DIAGNOSIS — A4901 Methicillin susceptible Staphylococcus aureus infection, unspecified site: Secondary | ICD-10-CM

## 2020-05-21 DIAGNOSIS — T829XXD Unspecified complication of cardiac and vascular prosthetic device, implant and graft, subsequent encounter: Secondary | ICD-10-CM | POA: Diagnosis not present

## 2020-05-21 DIAGNOSIS — K08199 Complete loss of teeth due to other specified cause, unspecified class: Secondary | ICD-10-CM | POA: Diagnosis not present

## 2020-05-21 DIAGNOSIS — T827XXA Infection and inflammatory reaction due to other cardiac and vascular devices, implants and grafts, initial encounter: Secondary | ICD-10-CM | POA: Diagnosis not present

## 2020-05-21 LAB — MAGNESIUM: Magnesium: 1.8 mg/dL (ref 1.7–2.4)

## 2020-05-21 LAB — HEPARIN LEVEL (UNFRACTIONATED): Heparin Unfractionated: 0.41 IU/mL (ref 0.30–0.70)

## 2020-05-21 LAB — CBC
HCT: 28.6 % — ABNORMAL LOW (ref 39.0–52.0)
Hemoglobin: 9 g/dL — ABNORMAL LOW (ref 13.0–17.0)
MCH: 30.2 pg (ref 26.0–34.0)
MCHC: 31.5 g/dL (ref 30.0–36.0)
MCV: 96 fL (ref 80.0–100.0)
Platelets: 249 10*3/uL (ref 150–400)
RBC: 2.98 MIL/uL — ABNORMAL LOW (ref 4.22–5.81)
RDW: 20.6 % — ABNORMAL HIGH (ref 11.5–15.5)
WBC: 9.3 10*3/uL (ref 4.0–10.5)
nRBC: 0 % (ref 0.0–0.2)

## 2020-05-21 LAB — LACTATE DEHYDROGENASE: LDH: 192 U/L (ref 98–192)

## 2020-05-21 LAB — PROTIME-INR
INR: 1.2 (ref 0.8–1.2)
Prothrombin Time: 14.9 seconds (ref 11.4–15.2)

## 2020-05-21 MED ORDER — MAGNESIUM SULFATE 4 GM/100ML IV SOLN
4.0000 g | Freq: Once | INTRAVENOUS | Status: AC
  Administered 2020-05-21: 4 g via INTRAVENOUS
  Filled 2020-05-21: qty 100

## 2020-05-21 MED ORDER — WARFARIN SODIUM 10 MG PO TABS
10.0000 mg | ORAL_TABLET | Freq: Once | ORAL | Status: AC
  Administered 2020-05-21: 10 mg via ORAL
  Filled 2020-05-21: qty 1

## 2020-05-21 NOTE — Plan of Care (Signed)
?  Problem: Education: ?Goal: Knowledge of General Education information will improve ?Description: Including pain rating scale, medication(s)/side effects and non-pharmacologic comfort measures ?Outcome: Progressing ?  ?Problem: Health Behavior/Discharge Planning: ?Goal: Ability to manage health-related needs will improve ?Outcome: Progressing ?  ?Problem: Clinical Measurements: ?Goal: Ability to maintain clinical measurements within normal limits will improve ?Outcome: Progressing ?Goal: Will remain free from infection ?Outcome: Progressing ?Goal: Diagnostic test results will improve ?Outcome: Progressing ?Goal: Respiratory complications will improve ?Outcome: Progressing ?Goal: Cardiovascular complication will be avoided ?Outcome: Progressing ?  ?Problem: Activity: ?Goal: Risk for activity intolerance will decrease ?Outcome: Progressing ?  ?Problem: Nutrition: ?Goal: Adequate nutrition will be maintained ?Outcome: Progressing ?  ?Problem: Coping: ?Goal: Level of anxiety will decrease ?Outcome: Progressing ?  ?Problem: Elimination: ?Goal: Will not experience complications related to bowel motility ?Outcome: Progressing ?Goal: Will not experience complications related to urinary retention ?Outcome: Progressing ?  ?Problem: Pain Managment: ?Goal: General experience of comfort will improve ?Outcome: Progressing ?  ?Problem: Safety: ?Goal: Ability to remain free from injury will improve ?Outcome: Progressing ?  ?Problem: Skin Integrity: ?Goal: Risk for impaired skin integrity will decrease ?Outcome: Progressing ?  ?Problem: Education: ?Goal: Patient will understand all VAD equipment and how it functions ?Outcome: Progressing ?Goal: Patient will be able to verbalize current INR target range and antiplatelet therapy for discharge home ?Outcome: Progressing ?  ?Problem: Cardiac: ?Goal: LVAD will function as expected and patient will experience no clinical alarms ?Outcome: Progressing ?  ?

## 2020-05-21 NOTE — Progress Notes (Signed)
Patient ID: Jon Martin, male   DOB: 1985-12-01, 35 y.o.   MRN: [id number]   Advanced Heart Failure VAD Team Note  PCP-Cardiologist: No primary care provider on file.   Subjective:     HM3 LVAD admitted w/ Drive-line infection.  - Wound culture with S aureus. Blood cx NGTD - 3/26 abx narrowed to cefazolin 2gm IV Q 8hr. ID following  - 3/28 CT abd/pelvis soft tissue no deep infection. Oral Surgeon consulted.  - 3/31 s/p extractions of teeth #5 and 15  - 3/31 Low flow alarms last night, got 1 L IVF and low flows have stopped - 4/5 small pustule on abdomen noted>> performed superficial I&D at bedside, no communication with DL tunnel, cultures sent and pending  - 4/8 Abscess at driveline site opened . CT abdomen done, abscess adjacent to driveline noted. - 4/13 I&D of driveline abscess - 4/15 Wound vac changed  Repeat cultures from 4/12 still growing staph aureus. 4 weeks of cefazolin post most recent surgery on 05/16/2020  Feels ok. Denies fevers or chills. No pain at wound vac site.   LVAD INTERROGATION:   HeartMate 3 LVAD:   Flow 4.2 liters/min, speed 5400, power 4.0  PI 3.5 multiple PI events VAD interrogated personally. Parameters stable.  Objective:    Vital Signs:   Temp:  [98.4 F (36.9 C)-98.9 F (37.2 C)] 98.8 F (37.1 C) (04/17 0800) Pulse Rate:  [87-112] 87 (04/17 0800) Resp:  [14-20] 20 (04/17 0800) BP: (73-99)/(51-85) 99/84 (04/17 0800) SpO2:  [95 %-99 %] 98 % (04/17 0800) Weight:  [56.7 kg] 56.7 kg (04/17 0440) Last BM Date: 05/21/20   Mean arterial Pressure 80s   Intake/Output:   Intake/Output Summary (Last 24 hours) at 05/21/2020 0906 Last data filed at 05/21/2020 0800 Gross per 24 hour  Intake 1833.26 ml  Output 850 ml  Net 983.26 ml     Physical Exam   General:  NAD.  HEENT: normal  Neck: supple. JVP not elevated.  Carotids 2+ bilat; no bruits. No lymphadenopathy or thryomegaly appreciated. Cor: LVAD hum.  Lungs: Clear. Abdomen: obese soft,  nontender, non-distended. No hepatosplenomegaly. No bruits or masses. Good bowel sounds. Driveline site with wound vac in place. No leak  Anchor in place.  Extremities: no cyanosis, clubbing, rash. Warm no edema  Neuro: alert & oriented x 3. No focal deficits. Moves all 4 without problem   Telemetry   NSR 80-90s Personally reviewed  Labs   Basic Metabolic Panel: Recent Labs  Lab 05/16/20 0112 05/17/20 0224 05/18/20 0033 05/19/20 0139 05/20/20 0026 05/20/20 0439 05/21/20 0107  NA 136 135 136 136  --  137  --   K 4.0 3.7 4.0 3.7  --  4.0  --   CL 102 102 103 100  --  102  --   CO2 23 24 22 28   --  25  --   GLUCOSE 107* 125* 99 126*  --  119*  --   BUN 6 6 7 9   --  14  --   CREATININE 0.79 0.78 0.82 0.77  --  0.68  --   CALCIUM 9.4 9.3 9.6 9.9  --  9.9  --   MG 1.9 1.8 1.7 1.9 1.9  --  1.8    Liver Function Tests: No results for input(s): AST, ALT, ALKPHOS, BILITOT, PROT, ALBUMIN in the last 168 hours. No results for input(s): LIPASE, AMYLASE in the last 168 hours. No results for input(s): AMMONIA in the last 168 hours.  CBC: Recent Labs  Lab 05/17/20 0224 05/18/20 0033 05/19/20 0139 05/20/20 0026 05/21/20 0107  WBC 7.7 7.9 8.1 9.3 9.3  HGB 8.3* 8.7* 9.1* 8.8* 9.0*  HCT 26.4* 27.8* 28.9* 27.5* 28.6*  MCV 97.4 96.9 95.7 97.5 96.0  PLT 217 216 221 232 249    INR: Recent Labs  Lab 05/17/20 0224 05/18/20 0033 05/19/20 0139 05/20/20 0026 05/21/20 0107  INR 1.2 1.1 1.0 1.0 1.2    Other results:    Imaging   No results found.   Medications:     Scheduled Medications: . (feeding supplement) PROSource Plus  30 mL Oral BID BM  . carvedilol  9.375 mg Oral BID WC  . losartan  25 mg Oral Daily  . multivitamin with minerals  1 tablet Oral Daily  . pantoprazole  40 mg Oral Daily  . sodium chloride flush  3 mL Intravenous Q12H  . Warfarin - Pharmacist Dosing Inpatient   Does not apply q1600    Infusions: . sodium chloride    .  ceFAZolin (ANCEF) IV 2  g (05/21/20 0609)  . heparin 1,000 Units/hr (05/21/20 0800)  . lactated ringers Stopped (05/16/20 1321)  . lactated ringers 10 mL/hr at 05/16/20 1136    PRN Medications: sodium chloride, acetaminophen, ondansetron (ZOFRAN) IV   Patient Profile   35 y/o male w/ chronic systolic HF s/p HM3 LVAD admitted w/ staph aureus drive-line infection   Assessment/Plan:    1. 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 -  Admitted for IV abx vancomycin/cefepime/Flagyl - Driveline culture with S aureus. Blood cx NGTD  - 3/27 abx narrowed to cefazolin 2gm IV Q 8hr => stopped 3/31 and got ortivancin 4/1  - CT abd/pelvis- soft tissue infection. No deep infection - 4/5 small pustule on abdomen noted >> performed superficial I&D at bedside, no communication with DL tunnel, cultures growing GPC.  -4/8 had open area at medial aspect communicates with driveline. Increased induration noted. This area was packed with 1/2 inch packing strip.  - Cefazolin restarted for total 4 weeks. He cant go home with PICC for home antibiotics. - CT with abscess adjacent to driveline.  - S/p surgical debridement in OR 4/12 by Dr. Laneta Simmers. Wound vac in place.  - Cultures from 4/12 still growing staph aureus  - per ID. Plan for 4 weeks of cefazolin post most recent surgery on 05/16/2020  - Wound vac changed 4/15. Suspect will need repeat change tomorrow  - I discussed placement with him and he would be willing to consider [facility] as his sister works there and his mom used to work there before she died. I am not sure they can handle VADs but will d/w SW on Monday   2.  Chronic Systolic HF due to NICM/VAD management - Echo EF 10% with biventricular failure - HM-3 VAD implant 02/26/19 - Echo 7/21 EF 40-45% - VAD interrogated personally. Parameters stable.Still with frequent PIs but no low flows - MAP 80s  - Continue coreg 9.375 mg twice a day.  - Continue losartan - LDH 192 -  INR 1.0-> 1.2 continue heparin. Back on warfarin Discussed dosing with PharmD personally.  3. Anemia - Baseline hgb has been upper 9's, admission hgb 7.7 => 6.1.  However, hgb up to 10.5 after 2 units PRCs and stool was heme negative. - no further bleeding at DL site - Hgb stable at 9.0  4. F/E/N - We continue to worry about his  nutritional status after his mother died. - Appetite has been great in the hospital.  5. Broken Teeth - 3/31 s/p extractions of teeth #5 and 15  - Appreciate dental surgery  - site looks good  6. Hypokalemia/ Hypomagnesemia  - Mg 1.8, K 4.0 - Supp mag .    Length of Stay: 23  Arvilla Meres, MD 05/21/2020, 9:06 AM  VAD Team --- VAD ISSUES ONLY--- Pager [phone] (7am - 7am)  Advanced Heart Failure Team  Pager [phone] (M-F; 7a - 5p)  Please contact [facility] for night-coverage after hours (5p -7a ) and weekends on [email or web address]

## 2020-05-21 NOTE — Progress Notes (Signed)
ANTICOAGULATION CONSULT NOTE  Pharmacy Consult for heparin/coumadin Indication: LVAD  No Known Allergies  Patient Measurements: Height: 5\' 5"  (165.1 cm) Weight: 56.7 kg (125 lb) IBW/kg (Calculated) : 61.5  Heparin dosing weight: 55.2 kg  Vital Signs: Temp: 99 F (37.2 C) (04/17 1146) Temp Source: Oral (04/17 1146) BP: 92/68 (04/17 1146) Pulse Rate: 96 (04/17 1146)  Labs: Recent Labs    05/19/20 0139 05/19/20 0145 05/20/20 0026 05/20/20 0439 05/21/20 0107  HGB 9.1*  --  8.8*  --  9.0*  HCT 28.9*  --  27.5*  --  28.6*  PLT 221  --  232  --  249  LABPROT 13.6  --  13.6  --  14.9  INR 1.0  --  1.0  --  1.2  HEPARINUNFRC  --  0.30 0.30  --  0.41  CREATININE 0.77  --   --  0.68  --     Estimated Creatinine Clearance: 104.3 mL/min (by C-G formula based on SCr of 0.68 mg/dL).   Medical History: Past Medical History:  Diagnosis Date  . Dilated cardiomyopathy (HCC)   . Dilated cardiomyopathy (HCC) 02/2019  . Polysubstance abuse (HCC)     Assessment: 26 yom presenting with concern for driveline infection - hx HM3 implanted on 02/26/19 - on warfarin PTA. PTA regimen is 2.5 mg daily. He is now s/p dental extractions on 3/31.  S/p OR for wound + wound vac.  INR 1.2 slowly rising on increased doses, heparin level at upper end of goal 0.4 on heparin drip 1000uts/hr.  No overt bleeding or complications noted, Hgb stable 8-9s, Pltc ok.  Goal of Therapy:  Heparin level ~0.3 INR 2-2.5 Monitor platelets by anticoagulation protocol: Yes   Plan:  Continue heparin drip 1000 units/hr Coumadin 10 mg x 1 tonight. Aiming for INR no higher than 2 for now until interventions on wound complete. AM CBC, protime and heparin level.    4/31 Pharm.D. CPP, BCPS Clinical Pharmacist [phone] 05/21/2020 12:09 PM    [facility] pharmacy phone numbers are listed on  [email or web address]

## 2020-05-21 NOTE — Plan of Care (Signed)
  Problem: Education: Goal: Knowledge of General Education information will improve Description: Including pain rating scale, medication(s)/side effects and non-pharmacologic comfort measures Outcome: Progressing   Problem: Health Behavior/Discharge Planning: Goal: Ability to manage health-related needs will improve Outcome: Progressing   Problem: Clinical Measurements: Goal: Ability to maintain clinical measurements within normal limits will improve Outcome: Progressing Goal: Will remain free from infection Outcome: Progressing Goal: Respiratory complications will improve Outcome: Progressing Goal: Cardiovascular complication will be avoided Outcome: Progressing   Problem: Activity: Goal: Risk for activity intolerance will decrease Outcome: Progressing   Problem: Nutrition: Goal: Adequate nutrition will be maintained Outcome: Progressing   Problem: Coping: Goal: Level of anxiety will decrease Outcome: Progressing   Problem: Elimination: Goal: Will not experience complications related to bowel motility Outcome: Progressing Goal: Will not experience complications related to urinary retention Outcome: Progressing   Problem: Safety: Goal: Ability to remain free from injury will improve Outcome: Progressing   

## 2020-05-22 DIAGNOSIS — I5022 Chronic systolic (congestive) heart failure: Secondary | ICD-10-CM | POA: Diagnosis not present

## 2020-05-22 DIAGNOSIS — T827XXA Infection and inflammatory reaction due to other cardiac and vascular devices, implants and grafts, initial encounter: Secondary | ICD-10-CM | POA: Diagnosis not present

## 2020-05-22 LAB — LACTATE DEHYDROGENASE: LDH: 198 U/L — ABNORMAL HIGH (ref 98–192)

## 2020-05-22 LAB — CBC
HCT: 27 % — ABNORMAL LOW (ref 39.0–52.0)
Hemoglobin: 8.5 g/dL — ABNORMAL LOW (ref 13.0–17.0)
MCH: 30.1 pg (ref 26.0–34.0)
MCHC: 31.5 g/dL (ref 30.0–36.0)
MCV: 95.7 fL (ref 80.0–100.0)
Platelets: 200 10*3/uL (ref 150–400)
RBC: 2.82 MIL/uL — ABNORMAL LOW (ref 4.22–5.81)
RDW: 20.1 % — ABNORMAL HIGH (ref 11.5–15.5)
WBC: 9.9 10*3/uL (ref 4.0–10.5)
nRBC: 0 % (ref 0.0–0.2)

## 2020-05-22 LAB — BASIC METABOLIC PANEL
Anion gap: 11 (ref 5–15)
BUN: 16 mg/dL (ref 6–20)
CO2: 22 mmol/L (ref 22–32)
Calcium: 9.5 mg/dL (ref 8.9–10.3)
Chloride: 101 mmol/L (ref 98–111)
Creatinine, Ser: 0.85 mg/dL (ref 0.61–1.24)
GFR, Estimated: >60 mL/min (ref >60)
Glucose, Bld: 121 mg/dL — ABNORMAL HIGH (ref 70–99)
Potassium: 3.8 mmol/L (ref 3.5–5.1)
Sodium: 134 mmol/L — ABNORMAL LOW (ref 135–145)

## 2020-05-22 LAB — AEROBIC/ANAEROBIC CULTURE W GRAM STAIN (SURGICAL/DEEP WOUND)

## 2020-05-22 LAB — PROTIME-INR
INR: 1.4 — ABNORMAL HIGH (ref 0.8–1.2)
Prothrombin Time: 16.9 seconds — ABNORMAL HIGH (ref 11.4–15.2)

## 2020-05-22 LAB — MAGNESIUM: Magnesium: 1.8 mg/dL (ref 1.7–2.4)

## 2020-05-22 LAB — HEPARIN LEVEL (UNFRACTIONATED): Heparin Unfractionated: 0.33 IU/mL (ref 0.30–0.70)

## 2020-05-22 MED ORDER — WARFARIN SODIUM 10 MG PO TABS
10.0000 mg | ORAL_TABLET | Freq: Once | ORAL | Status: AC
  Administered 2020-05-22: 10 mg via ORAL
  Filled 2020-05-22: qty 1

## 2020-05-22 MED ORDER — WARFARIN SODIUM 2.5 MG PO TABS: 12.5000 mg | ORAL_TABLET | Freq: Once | ORAL | Status: DC

## 2020-05-22 NOTE — NC FL2 (Signed)
[place] MEDICAID FL2 LEVEL OF CARE SCREENING TOOL     IDENTIFICATION  Patient Name: Jon Martin Birthdate: Jan 05, 1986 Sex: male Admission Date (Current Location): 04/28/2020  [facility] and [place] Number:  Producer, television/film/video and Address:  [facility], [address] [place], [place] [address]      Provider Number: [id number]  Attending Physician Name and Address:  Dolores Patty, MD  Relative Name and Phone Number:  [place], [place] [facility])   [phone]    Current Level of Care: Hospital Recommended Level of Care: Skilled Nursing Facility Prior Approval Number:    Date Approved/Denied:   PASRR Number:    Discharge Plan: SNF    Current Diagnoses: Patient Active Problem List   Diagnosis Date Noted  . MSSA (methicillin susceptible Staphylococcus aureus) infection   . Loss of teeth due to extraction   . Retained dental root   . Chronic periodontitis   . Complication involving left ventricular assist device (LVAD) 04/28/2020  . Dental caries 04/28/2020  . 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/2020  . Left ventricular assist device (LVAD) complication 01/12/2020  . Dehydration, moderate 08/02/2019  . Chronic systolic heart failure (HCC) 08/02/2019  . AKI (acute kidney injury) (HCC) 08/02/2019  . Intractable vomiting with nausea 08/02/2019  . LVAD (left ventricular assist device) present (HCC) 08/02/2019  . Hypokalemia 08/02/2019  . Congestive heart failure (HCC)   . Cardiogenic shock (HCC)   . Goals of care, counseling/discussion   . Preoperative evaluation to rule out surgical contraindication   . Protein-calorie malnutrition, severe 02/11/2019  . Dilated cardiomyopathy (HCC) 02/10/2019    Orientation RESPIRATION BLADDER Height & Weight     Self,Time,Situation,Place  Normal Continent,Indwelling catheter (Incision L Abdomen) Weight: 125 lb 7.1 oz (56.9 kg) Height:  5\' 5"   (165.1 cm)  BEHAVIORAL SYMPTOMS/MOOD NEUROLOGICAL BOWEL NUTRITION STATUS      Continent Diet (See DC Summary)  AMBULATORY STATUS COMMUNICATION OF NEEDS Skin   Independent Verbally Surgical wounds,Normal (Incision L Abdomen)                       Personal Care Assistance Level of Assistance  Bathing,Feeding,Dressing Bathing Assistance: Independent Feeding assistance: Independent Dressing Assistance: Independent     Functional Limitations Info  Sight,Hearing,Speech Sight Info: Adequate Hearing Info: Adequate Speech Info: Adequate    SPECIAL CARE FACTORS FREQUENCY  PT (By licensed PT),OT (By licensed OT)     PT Frequency: 5x a week OT Frequency: 5x a week            Contractures Contractures Info: Not present    Additional Factors Info  Code Status,Allergies Code Status Info: Partial Allergies Info: No Known Allergies           Current Medications (05/22/2020):  This is the current hospital active medication list Current Facility-Administered Medications  Medication Dose Route Frequency Provider Last Rate Last Admin  . (feeding supplement) PROSource Plus liquid 30 mL  30 mL Oral BID BM Bensimhon, 05/24/2020, MD   30 mL at 05/22/20 1222  . 0.9 %  sodium chloride infusion  250 mL Intravenous PRN 05/24/20, MD      . acetaminophen (TYLENOL) tablet 650 mg  650 mg Oral Q4H PRN Alleen Borne, MD      . carvedilol (COREG) tablet 9.375 mg  9.375 mg Oral BID WC Clegg, Amy D, NP   9.375 mg at 05/22/20 0821  .  ceFAZolin (ANCEF) IVPB 2g/100 mL premix  2 g Intravenous Q8H Alleen Borne, MD 200 mL/hr at 05/22/20 1213 2 g at 05/22/20 1213  . heparin ADULT infusion 100 units/mL (25000 units/280mL)  1,000 Units/hr Intravenous Continuous Carney, Gwenlyn Found, RPH 10 mL/hr at 05/22/20 0630 1,000 Units/hr at 05/22/20 0630  . lactated ringers infusion   Intravenous Continuous Alleen Borne, MD   Stopped at 05/16/20 1321  . lactated ringers infusion   Intravenous Continuous  Alleen Borne, MD 10 mL/hr at 05/16/20 1136 Restarted at 05/16/20 1137  . losartan (COZAAR) tablet 25 mg  25 mg Oral Daily Alleen Borne, MD   25 mg at 05/22/20 8280  . multivitamin with minerals tablet 1 tablet  1 tablet Oral Daily Bensimhon, Bevelyn Buckles, MD   1 tablet at 05/22/20 [phone]  . ondansetron (ZOFRAN) injection 4 mg  4 mg Intravenous Q6H PRN Alleen Borne, MD      . pantoprazole (PROTONIX) EC tablet 40 mg  40 mg Oral Daily Alleen Borne, MD   40 mg at 05/22/20 0821  . sodium chloride flush (NS) 0.9 % injection 3 mL  3 mL Intravenous Q12H Alleen Borne, MD   3 mL at 05/22/20 1791  . Warfarin - Pharmacist Dosing Inpatient   Does not apply [id number] Alleen Borne, MD   Given at 05/19/20 1618     Discharge Medications: Please see discharge summary for a list of discharge medications.  Relevant Imaging Results:  Relevant Lab Results:   Additional Information SSN# [id number]  [name], [place]

## 2020-05-22 NOTE — Progress Notes (Addendum)
ANTICOAGULATION CONSULT NOTE  Pharmacy Consult for heparin/coumadin Indication: LVAD  No Known Allergies  Patient Measurements: Height: 5\' 5"  (165.1 cm) Weight: 56.9 kg (125 lb 7.1 oz) IBW/kg (Calculated) : 61.5  Heparin dosing weight: 55.2 kg  Vital Signs: Temp: 99.2 F (37.3 C) (04/18 0811) Temp Source: Oral (04/18 0811) BP: 98/65 (04/18 0811) Pulse Rate: 97 (04/18 0811)  Labs: Recent Labs    05/20/20 0026 05/20/20 0439 05/21/20 0107 05/22/20 0102  HGB 8.8*  --  9.0* 8.5*  HCT 27.5*  --  28.6* 27.0*  PLT 232  --  249 200  LABPROT 13.6  --  14.9 16.9*  INR 1.0  --  1.2 1.4*  HEPARINUNFRC 0.30  --  0.41 0.33  CREATININE  --  0.68  --  0.85    Estimated Creatinine Clearance: 98.6 mL/min (by C-G formula based on SCr of 0.85 mg/dL).   Medical History: Past Medical History:  Diagnosis Date  . Dilated cardiomyopathy (HCC)   . Dilated cardiomyopathy (HCC) 02/2019  . Polysubstance abuse (HCC)     Assessment: 73 yom presenting with concern for driveline infection - hx HM3 implanted on 02/26/19 - on warfarin PTA. PTA regimen is 2.5 mg daily. He is now s/p dental extractions on 3/31.  S/p OR for wound + wound vac.  INR trended up to 1.4 today, heparin level at goal 0.3.  No overt bleeding or complications noted, CBC stable overnight.   Awaiting INR to be greater than 1.8 to stop heparin and discharge but also will need placement for IV antibiotics.   Goal of Therapy:  Heparin level ~0.3 INR 2-2.5 Monitor platelets by anticoagulation protocol: Yes   Plan:  No changes to heparin. Coumadin 10 mg x 1 tonight AM CBC, protime and heparin level.  4/31 PharmD., BCPS Clinical Pharmacist 05/22/2020 10:15 AM

## 2020-05-22 NOTE — Progress Notes (Addendum)
Patient ID: Jon Martin, male   DOB: 10/13/1985, 35 y.o.   MRN: [id number]   Advanced Heart Failure VAD Team Note  PCP-Cardiologist: No primary care provider on file.   Subjective:     HM3 LVAD admitted w/ Drive-line infection.  - Wound culture with S aureus. Blood cx NGTD - 3/26 abx narrowed to cefazolin 2gm IV Q 8hr. ID following  - 3/28 CT abd/pelvis soft tissue no deep infection. Oral Surgeon consulted.  - 3/31 s/p extractions of teeth #5 and 15  - 3/31 Low flow alarms last night, got 1 L IVF and low flows have stopped - 4/5 small pustule on abdomen noted>> performed superficial I&D at bedside, no communication with DL tunnel, cultures sent and pending  - 4/8 Abscess at driveline site opened . CT abdomen done, abscess adjacent to driveline noted. - 4/13 I&D of driveline abscess - 4/15 Wound vac changed  Repeat cultures from 4/12 still growing staph aureus. ID recommending 4 weeks of cefazolin post most recent surgery on 05/16/2020  INR 1.4   Volume status stable. No complaints today.   LVAD INTERROGATION:   HeartMate 3 LVAD:   Flow 3.5 liters/min, speed 5450, power 3.8  PI 5.4 multiple PI events VAD interrogated personally. Parameters stable.  Objective:    Vital Signs:   Temp:  [97.8 F (36.6 C)-99.2 F (37.3 C)] 99.2 F (37.3 C) (04/18 0811) Pulse Rate:  [69-97] 97 (04/18 0811) Resp:  [16-20] 20 (04/18 0811) BP: (81-98)/(62-79) 98/65 (04/18 0811) SpO2:  [97 %-99 %] 99 % (04/18 0521) Weight:  [56.9 kg] 56.9 kg (04/18 0521) Last BM Date: 05/21/20   Mean arterial Pressure 70s   Intake/Output:   Intake/Output Summary (Last 24 hours) at 05/22/2020 0939 Last data filed at 05/22/2020 0811 Gross per 24 hour  Intake 1143.86 ml  Output 1225 ml  Net -81.14 ml     Physical Exam   General:  Well appearing, laying in bed. NAD HEENT: normal  Neck: supple. JVP not elevated.  Carotids 2+ bilat; no bruits. No lymphadenopathy or thryomegaly appreciated. Cor: LVAD hum.   Lungs: CTAB. No wheezing  Abdomen: obese soft, nontender, non-distended. No hepatosplenomegaly. No bruits or masses. Good bowel sounds. Driveline site with wound vac in place. No leak Anchor in place.  Extremities: no cyanosis, clubbing, rash. Warm and dry.  No edema  Neuro: alert & oriented x 3. No focal deficits. Moves all 4 without problem   Telemetry   NSR 80-90s Personally reviewed  Labs   Basic Metabolic Panel: Recent Labs  Lab 05/17/20 0224 05/18/20 0033 05/19/20 0139 05/20/20 0026 05/20/20 0439 05/21/20 0107 05/22/20 0102  NA 135 136 136  --  137  --  134*  K 3.7 4.0 3.7  --  4.0  --  3.8  CL 102 103 100  --  102  --  101  CO2 24 22 28   --  25  --  22  GLUCOSE 125* 99 126*  --  119*  --  121*  BUN 6 7 9   --  14  --  16  CREATININE 0.78 0.82 0.77  --  0.68  --  0.85  CALCIUM 9.3 9.6 9.9  --  9.9  --  9.5  MG 1.8 1.7 1.9 1.9  --  1.8 1.8    Liver Function Tests: No results for input(s): AST, ALT, ALKPHOS, BILITOT, PROT, ALBUMIN in the last 168 hours. No results for input(s): LIPASE, AMYLASE in the last 168 hours. No  results for input(s): AMMONIA in the last 168 hours.  CBC: Recent Labs  Lab 05/18/20 0033 05/19/20 0139 05/20/20 0026 05/21/20 0107 05/22/20 0102  WBC 7.9 8.1 9.3 9.3 9.9  HGB 8.7* 9.1* 8.8* 9.0* 8.5*  HCT 27.8* 28.9* 27.5* 28.6* 27.0*  MCV 96.9 95.7 97.5 96.0 95.7  PLT 216 221 232 249 200    INR: Recent Labs  Lab 05/18/20 0033 05/19/20 0139 05/20/20 0026 05/21/20 0107 05/22/20 0102  INR 1.1 1.0 1.0 1.2 1.4*    Other results:    Imaging   No results found.   Medications:     Scheduled Medications: . (feeding supplement) PROSource Plus  30 mL Oral BID BM  . carvedilol  9.375 mg Oral BID WC  . losartan  25 mg Oral Daily  . multivitamin with minerals  1 tablet Oral Daily  . pantoprazole  40 mg Oral Daily  . sodium chloride flush  3 mL Intravenous Q12H  . Warfarin - Pharmacist Dosing Inpatient   Does not apply q1600     Infusions: . sodium chloride    .  ceFAZolin (ANCEF) IV Stopped (05/22/20 0610)  . heparin 1,000 Units/hr (05/22/20 0630)  . lactated ringers Stopped (05/16/20 1321)  . lactated ringers 10 mL/hr at 05/16/20 1136    PRN Medications: sodium chloride, acetaminophen, ondansetron (ZOFRAN) IV   Patient Profile   35 y/o male w/ chronic systolic HF s/p HM3 LVAD admitted w/ staph aureus drive-line infection   Assessment/Plan:    1. 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 -  Admitted for IV abx vancomycin/cefepime/Flagyl - Driveline culture with S aureus. Blood cx NGTD  - 3/27 abx narrowed to cefazolin 2gm IV Q 8hr => stopped 3/31 and got ortivancin 4/1  - CT abd/pelvis- soft tissue infection. No deep infection - 4/5 small pustule on abdomen noted >> performed superficial I&D at bedside, no communication with DL tunnel, cultures growing GPC.  -4/8 had open area at medial aspect communicates with driveline. Increased induration noted. This area was packed with 1/2 inch packing strip.  - Cefazolin restarted for total 4 weeks. He cant go home with PICC for home antibiotics. - CT with abscess adjacent to driveline.  - S/p surgical debridement in OR 4/12 by Dr. Laneta Simmers. Wound vac in place.  - Cultures from 4/12 still growing staph aureus  - per ID. Plan for 4 weeks of cefazolin post most recent surgery on 05/16/2020  - Wound vac changed 4/15.  - I discussed placement with him and he would be willing to consider [facility] as his sister works there and his mom used to work there before she died. I am not sure they can handle VADs but will d/w SW. VAD coordinators to contact facility today to see if they could accommodate VAD pt.     2.  Chronic Systolic HF due to NICM/VAD management - Echo EF 10% with biventricular failure - HM-3 VAD implant 02/26/19 - Echo 7/21 EF 40-45% - VAD interrogated personally. Parameters stable.Still with frequent PIs  but no low flows - MAP 70s  - Continue coreg 9.375 mg twice a day.  - Continue losartan - LDH 198 - INR 1.0->1.2->1.4 continue heparin. Back on warfarin Discussed dosing with PharmD personally.  3. Anemia - Baseline hgb has been upper 9's, admission hgb 7.7 => 6.1.  However, hgb up to 10.5 after 2 units PRCs and stool was heme negative. - no further bleeding at DL  site - Hgb stable at 8.5  4. F/E/N - We continue to worry about his nutritional status after his mother died. - Appetite has been great in the hospital.  5. Broken Teeth - 3/31 s/p extractions of teeth #5 and 15  - Appreciate dental surgery  - site looks good  6. Hypokalemia/ Hypomagnesemia  - Mg 1.8, K 3.8 - Supp lytes     Length of Stay: [address], PA-C 05/22/2020, 9:39 AM  VAD Team --- VAD ISSUES ONLY--- Pager [phone] (7am - 7am)  Advanced Heart Failure Team  Pager [phone] (M-F; 7a - 5p)  Please contact [facility] for night-coverage after hours (5p -7a ) and weekends on [email or web address]  Patient seen and examined with the above-signed Advanced Practice Provider and/or Housestaff. I personally reviewed laboratory data, imaging studies and relevant notes. I independently examined the patient and formulated the important aspects of the plan. I have edited the note to reflect any of my changes or salient points. I have personally discussed the plan with the patient and/or family.  Remains on IV abx. No f/c. Wound vac site ok. No pain. Denies SOB. Anxious to go home  General:  NAD.  HEENT: normal  Neck: supple. JVP not elevated.  Carotids 2+ bilat; no bruits. No lymphadenopathy or thryomegaly appreciated. Cor: LVAD hum.  Lungs: Clear. Abdomen: soft, nontender, non-distended. No hepatosplenomegaly. No bruits or masses. Good bowel sounds. Driveline site with wound vac in place. Anchor in place.  Extremities: no cyanosis, clubbing, rash. Warm no edema  Neuro: alert & oriented x 3. No focal deficits.  Moves all 4 without problem   DL site looks good. Can probably switch to wet-to-dry dressings. INR remains 1.4. Continue heparin/warfarin until INR >= 1.8. Will need PICC for 4 weeks of cefazolin. Consult IR.   VAD interrogated personally. Parameters stable.  NH placement d/w VAD coordinator at bedside.   Arvilla Meres, MD  10:47 AM

## 2020-05-22 NOTE — Progress Notes (Addendum)
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   HM III LVAD implanted on 02/26/19 by Dr. Maren Beach under Destination Therapy criteria.  Patient awake, alert, watching TV. Denies complaints. Wants to go home. INR 1.4 today.   Discussed need for SNF placement for a few weeks for IV antibiotics. Pt requesting to be placed at [facility] in [place]. I left a voicemail for the admissions coordinator to discuss patient. [phone]. Pt will need PICC placed.   Wound vac and drive line dressing change as documented below.   Vital signs: Temp: 99.2 HR: 97 Doppler Pressure: 72 Automatic BP:  98/65 (75) O2 Sat: 99% RA  Wt: 122.8>123>124.1>120.8>121.4>121.6.......123.4>124.7>124.3>125.4 lbs   LVAD interrogation reveals:  Speed: 5400 Flow: 3.9 Power: 3.8w PI: 4.5 Hct: 20 - do not change  Alarms: none Events: 100+ PI events today Fixed speed: 5400 Low speed limit: 5100  Drive Line: Existing VAD dressing clean, dry, intact. Existing VAD dressing removed and site care performed using sterile technique. Drive line exit site cleaned with Chlora prep applicators x 2, RINSED WITH SALINE, allowed to dry, and gauze dressing with silver strip re-applied. Exit site partially incorporated, the velour is fully implanted at exit site. No tenderness, redness, drainage, or odor from site. Pt has dark dried skin  in the surrounding area of the anchor; rash improved from last week. Drive line anchor intact. Next dressing change with wound vac change on 05/25/20.   Abdominal Abcsess: Wound vac in place with small amount bloody drainage. Wound vac changed using sterile technique by [name] NP. Existing vac dressing and black sponge removed. Drive line exit care as above. Cleansed around incision opening with CHG swab x 2, RINSED WITH SALINE, and allowed to dry. Small amount of bloody drainage in wound bed. Small black sponge placed in  wound bed and covered with clear vac dressing. Good seal achieved. Next dressing change due 05/25/20.    Labs:  LDH trend: 181>171........212>220>198>203>201>198  INR trend: 2.3>1.7>1.2>1.1.......1.2>1.1>1.4  Anticoagulation Plan: -INR Goal: 2.0 - 2.5 -ASA Dose: none  Blood Products:  - 04/29/20>>2 units PCs  Device: N/A  Infection:  - 04/28/20 BCs>> No growth final - 04/28/20 DL culture>>rare staph aureus; final  - 05/04/20 BC>> No growth, final - 05/09/20 Abdominal abscess culture>> Staph aureus  Drips:  Heparin @ 1000 units/hr  Plan/Recommendations:  1. Call VAD Coordinator if any VAD equipment or drive line issues. 2. Weekly VAD drive line dressing changes or as needed with wound VAC change.  3. Wound vac dressing change today per VAD coordinators. Next vac change due 05/25/20.   Alyce Pagan RN VAD Coordinator  Office: [phone]  24/7 Pager: [phone]

## 2020-05-23 ENCOUNTER — Inpatient Hospital Stay: Payer: Self-pay

## 2020-05-23 DIAGNOSIS — T827XXA Infection and inflammatory reaction due to other cardiac and vascular devices, implants and grafts, initial encounter: Secondary | ICD-10-CM | POA: Diagnosis not present

## 2020-05-23 DIAGNOSIS — I5022 Chronic systolic (congestive) heart failure: Secondary | ICD-10-CM | POA: Diagnosis not present

## 2020-05-23 DIAGNOSIS — E440 Moderate protein-calorie malnutrition: Secondary | ICD-10-CM | POA: Insufficient documentation

## 2020-05-23 LAB — CBC
HCT: 28.2 % — ABNORMAL LOW (ref 39.0–52.0)
Hemoglobin: 8.7 g/dL — ABNORMAL LOW (ref 13.0–17.0)
MCH: 29.5 pg (ref 26.0–34.0)
MCHC: 30.9 g/dL (ref 30.0–36.0)
MCV: 95.6 fL (ref 80.0–100.0)
Platelets: 234 10*3/uL (ref 150–400)
RBC: 2.95 MIL/uL — ABNORMAL LOW (ref 4.22–5.81)
RDW: 19.8 % — ABNORMAL HIGH (ref 11.5–15.5)
WBC: 7.4 10*3/uL (ref 4.0–10.5)
nRBC: 0 % (ref 0.0–0.2)

## 2020-05-23 LAB — BASIC METABOLIC PANEL
Anion gap: 8 (ref 5–15)
BUN: 14 mg/dL (ref 6–20)
CO2: 25 mmol/L (ref 22–32)
Calcium: 9.6 mg/dL (ref 8.9–10.3)
Chloride: 103 mmol/L (ref 98–111)
Creatinine, Ser: 0.8 mg/dL (ref 0.61–1.24)
GFR, Estimated: >60 mL/min (ref >60)
Glucose, Bld: 107 mg/dL — ABNORMAL HIGH (ref 70–99)
Potassium: 3.9 mmol/L (ref 3.5–5.1)
Sodium: 136 mmol/L (ref 135–145)

## 2020-05-23 LAB — HEPARIN LEVEL (UNFRACTIONATED): Heparin Unfractionated: 0.37 IU/mL (ref 0.30–0.70)

## 2020-05-23 LAB — LACTATE DEHYDROGENASE: LDH: 206 U/L — ABNORMAL HIGH (ref 98–192)

## 2020-05-23 LAB — PROTIME-INR
INR: 1.6 — ABNORMAL HIGH (ref 0.8–1.2)
Prothrombin Time: 19.2 seconds — ABNORMAL HIGH (ref 11.4–15.2)

## 2020-05-23 LAB — MAGNESIUM: Magnesium: 1.6 mg/dL — ABNORMAL LOW (ref 1.7–2.4)

## 2020-05-23 MED ORDER — POTASSIUM CHLORIDE CRYS ER 20 MEQ PO TBCR
20.0000 meq | EXTENDED_RELEASE_TABLET | Freq: Once | ORAL | Status: AC
  Administered 2020-05-23: 20 meq via ORAL
  Filled 2020-05-23: qty 1

## 2020-05-23 MED ORDER — MAGNESIUM SULFATE 4 GM/100ML IV SOLN
4.0000 g | Freq: Once | INTRAVENOUS | Status: AC
  Administered 2020-05-23: 4 g via INTRAVENOUS
  Filled 2020-05-23: qty 100

## 2020-05-23 MED ORDER — WARFARIN SODIUM 3 MG PO TABS
6.0000 mg | ORAL_TABLET | Freq: Once | ORAL | Status: AC
  Administered 2020-05-23: 6 mg via ORAL
  Filled 2020-05-23: qty 2

## 2020-05-23 MED ORDER — FUROSEMIDE 10 MG/ML IJ SOLN
20.0000 mg | Freq: Once | INTRAMUSCULAR | Status: AC
  Administered 2020-05-23: 20 mg via INTRAVENOUS
  Filled 2020-05-23: qty 2

## 2020-05-23 MED ORDER — FUROSEMIDE 20 MG PO TABS: 20.0000 mg | ORAL_TABLET | Freq: Once | ORAL | Status: DC

## 2020-05-23 NOTE — TOC Progression Note (Addendum)
Transition of Care [facility]) - Progression Note    Patient Details  Name: Jon Martin MRN: [id number] Date of Birth: 06-12-1985  [facility] of [facility]) CM/SW Contact  [name], [place] Phone Number: 05/23/2020, 3:05 PM  Clinical Narrative:     2:40pm- CSW contacted [facility], they stated that they are not able to transport pt bc of LVAD. CSW will follow up for transportation.  3:00pm- CSW spoke with [facility] transportation [phone]), they are able to transport pt but the fee up front will be $1,263.00 or a promise to pay from the hospital.    Expected Discharge Plan: Skilled Nursing Facility Barriers to Discharge: Continued Medical Work up,SNF Pending bed offer,Insurance Authorization  Expected Discharge Plan and Services Expected Discharge Plan: Skilled Nursing Facility                                               Social Determinants of Health (SDOH) Interventions    Readmission Risk Interventions No flowsheet data found.

## 2020-05-23 NOTE — Progress Notes (Addendum)
Patient ID: Jon Martin, male   DOB: 08-21-1985, 35 y.o.   MRN: [id number]   Advanced Heart Failure VAD Team Note  PCP-Cardiologist: Dr. Gala Romney   Subjective:     HM3 LVAD admitted w/ Drive-line infection.  - Wound culture with S aureus. Blood cx NGTD - 3/26 abx narrowed to cefazolin 2gm IV Q 8hr. ID following  - 3/28 CT abd/pelvis soft tissue no deep infection. Oral Surgeon consulted.  - 3/31 s/p extractions of teeth #5 and 15  - 3/31 Low flow alarms last night, got 1 L IVF and low flows have stopped - 4/5 small pustule on abdomen noted>> performed superficial I&D at bedside, no communication with DL tunnel, cultures sent and pending  - 4/8 Abscess at driveline site opened . CT abdomen done, abscess adjacent to driveline noted. - 4/13 I&D of driveline abscess - 4/15 Wound vac changed  Repeat cultures from 4/12 still growing staph aureus. ID recommending 4 weeks of cefazolin post most recent surgery on 05/16/2020  INR trending up. 1.7 today. Hgb trending up 9.0 today   Feels well. No complaints.   LVAD INTERROGATION:   HeartMate 3 LVAD:   Flow 4.0 liters/min, speed 5450, power 3.6 PI 3.9 multiple PI events VAD interrogated personally. Parameters stable.  Objective:    Vital Signs:   Temp:  [98 F (36.7 C)-98.7 F (37.1 C)] 98.7 F (37.1 C) (04/19 0809) Pulse Rate:  [87-94] 94 (04/19 0703) Resp:  [13-20] 18 (04/19 0809) BP: (75-121)/(52-108) 121/108 (04/19 0809) SpO2:  [98 %-99 %] 98 % (04/19 0809) Weight:  [57.7 kg] 57.7 kg (04/19 0300) Last BM Date: 05/21/20   Mean arterial Pressure  70s   Intake/Output:   Intake/Output Summary (Last 24 hours) at 05/23/2020 0953 Last data filed at 05/23/2020 0703 Gross per 24 hour  Intake 440.21 ml  Output 2000 ml  Net -1559.79 ml     Physical Exam   General:  Well appearing. No distress HEENT: normal  Neck: supple. No JVD.  Carotids 2+ bilat; no bruits. No lymphadenopathy or thryomegaly appreciated. Cor: LVAD hum.   Lungs: clear bilaterally, no wheezing  Abdomen: obese soft, nontender, non-distended. No hepatosplenomegaly. No bruits or masses. Good bowel sounds. Driveline site with wound vac in place. No leak Anchor in place.  Extremities: no cyanosis, clubbing, rash. Warm and dry.  No edema  Neuro: alert & oriented x 3. No focal deficits. Moves all 4 without problem   Telemetry   NSR 90s Personally reviewed  Labs   Basic Metabolic Panel: Recent Labs  Lab 05/18/20 0033 05/19/20 0139 05/20/20 0026 05/20/20 0439 05/21/20 0107 05/22/20 0102 05/23/20 0326  NA 136 136  --  137  --  134* 136  K 4.0 3.7  --  4.0  --  3.8 3.9  CL 103 100  --  102  --  101 103  CO2 22 28  --  25  --  22 25  GLUCOSE 99 126*  --  119*  --  121* 107*  BUN 7 9  --  14  --  16 14  CREATININE 0.82 0.77  --  0.68  --  0.85 0.80  CALCIUM 9.6 9.9  --  9.9  --  9.5 9.6  MG 1.7 1.9 1.9  --  1.8 1.8 1.6*    Liver Function Tests: No results for input(s): AST, ALT, ALKPHOS, BILITOT, PROT, ALBUMIN in the last 168 hours. No results for input(s): LIPASE, AMYLASE in the last 168 hours. No  results for input(s): AMMONIA in the last 168 hours.  CBC: Recent Labs  Lab 05/19/20 0139 05/20/20 0026 05/21/20 0107 05/22/20 0102 05/23/20 0326  WBC 8.1 9.3 9.3 9.9 7.4  HGB 9.1* 8.8* 9.0* 8.5* 8.7*  HCT 28.9* 27.5* 28.6* 27.0* 28.2*  MCV 95.7 97.5 96.0 95.7 95.6  PLT 221 232 249 200 234    INR: Recent Labs  Lab 05/19/20 0139 05/20/20 0026 05/21/20 0107 05/22/20 0102 05/23/20 0326  INR 1.0 1.0 1.2 1.4* 1.6*    Other results:    Imaging   No results found.   Medications:     Scheduled Medications: . (feeding supplement) PROSource Plus  30 mL Oral BID BM  . carvedilol  9.375 mg Oral BID WC  . losartan  25 mg Oral Daily  . multivitamin with minerals  1 tablet Oral Daily  . pantoprazole  40 mg Oral Daily  . sodium chloride flush  3 mL Intravenous Q12H  . warfarin  6 mg Oral ONCE-1600  . Warfarin -  Pharmacist Dosing Inpatient   Does not apply q1600    Infusions: . sodium chloride    .  ceFAZolin (ANCEF) IV 2 g (05/23/20 1610)  . heparin 1,000 Units/hr (05/23/20 0330)  . lactated ringers Stopped (05/16/20 1321)  . lactated ringers 10 mL/hr at 05/16/20 1136    PRN Medications: sodium chloride, acetaminophen, ondansetron (ZOFRAN) IV   Patient Profile   35 y/o male w/ chronic systolic HF s/p HM3 LVAD admitted w/ staph aureus drive-line infection   Assessment/Plan:    1. 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 -  Admitted for IV abx vancomycin/cefepime/Flagyl - Driveline culture with S aureus. Blood cx NGTD  - 3/27 abx narrowed to cefazolin 2gm IV Q 8hr => stopped 3/31 and got ortivancin 4/1  - CT abd/pelvis- soft tissue infection. No deep infection - 4/5 small pustule on abdomen noted >> performed superficial I&D at bedside, no communication with DL tunnel, cultures growing GPC.  -4/8 had open area at medial aspect communicates with driveline. Increased induration noted. This area was packed with 1/2 inch packing strip.  - Cefazolin restarted for total 4 weeks. He cant go home with PICC for home antibiotics. - CT with abscess adjacent to driveline.  - S/p surgical debridement in OR 4/12 by Dr. Laneta Simmers. Wound vac in place.  - Cultures from 4/12 still growing staph aureus  - per ID. Plan for 4 weeks of cefazolin post most recent surgery on 05/16/2020  - Wound vac changed 4/15.  - Plan to d/c to [facility] tomorrow for continuation of IV abx and wound care management   2.  Chronic Systolic HF due to NICM/VAD management - Echo EF 10% with biventricular failure - HM-3 VAD implant 02/26/19 - Echo 7/21 EF 40-45% - VAD interrogated personally. Parameters stable.Still with frequent PIs but no low flows - MAP 70s - Volume status ok on exam  - Continue coreg 9.375 mg twice a day.  - Continue losartan - LDH 206 - INR  1.0->1.2->1.4->1.6->1.7 continue heparin. Back on warfarin Discussed dosing with PharmD personally.  3. Anemia - Baseline hgb has been upper 9's, admission hgb 7.7 => 6.1.  However, hgb up to 10.5 after 2 units PRCs and stool was heme negative. - no further bleeding at DL site - Hgb stable at 9.0  4. F/E/N - We continue to worry about his nutritional status after his mother died. - Appetite has been  great in the hospital.  5. Broken Teeth - 3/31 s/p extractions of teeth #5 and 15  - Appreciate dental surgery  - site looks good  6. Hypokalemia/ Hypomagnesemia  - improved  - Mg 2.0, K 3.9    Length of Stay: [address], PA-C 05/23/2020, 9:53 AM  VAD Team --- VAD ISSUES ONLY--- Pager [phone] (7am - 7am)  Advanced Heart Failure Team  Pager [phone] (M-F; 7a - 5p)  Please contact [facility] for night-coverage after hours (5p -7a ) and weekends on [email or web address]   Patient seen and examined with the above-signed Advanced Practice Provider and/or Housestaff. I personally reviewed laboratory data, imaging studies and relevant notes. I independently examined the patient and formulated the important aspects of the plan. I have edited the note to reflect any of my changes or salient points. I have personally discussed the plan with the patient and/or family.  Remains on IV abx. He is afebrile. Wound site looks good. Volume status looks better after dose of torsemide   General:  NAD.  HEENT: normal  Neck: supple. JVP not elevated.  Carotids 2+ bilat; no bruits. No lymphadenopathy or thryomegaly appreciated. Cor: LVAD hum.  Lungs: Clear. Abdomen: obese soft, nontender, non-distended. No hepatosplenomegaly. No bruits or masses. Good bowel sounds. Driveline site wound dressing ok. Anchor in place.  Extremities: no cyanosis, clubbing, rash. Warm no edema  Neuro: alert & oriented x 3. No focal deficits. Moves all 4 without problem   Wound is stable. Continue IV abx.  Discussed pan for d/c with SW team. Plan d/c to [facility] this [facility] after VAD training for site. VAD interrogated personally. Parameters stable.  Remains on heparin/warfarin without bleeding. Discussed warfarin dosing with PharmD personally.  Arvilla Meres, MD  9:50 PM  .

## 2020-05-23 NOTE — Progress Notes (Signed)
ANTICOAGULATION CONSULT NOTE  Pharmacy Consult for heparin/coumadin Indication: LVAD  No Known Allergies  Patient Measurements: Height: 5\' 5"  (165.1 cm) Weight: 57.7 kg (127 lb 3.3 oz) IBW/kg (Calculated) : 61.5  Heparin dosing weight: 55.2 kg  Vital Signs: Temp: 98.7 F (37.1 C) (04/19 0809) Temp Source: Oral (04/19 0809) BP: 121/108 (04/19 0809) Pulse Rate: 94 (04/19 0703)  Labs: Recent Labs    05/21/20 0107 05/22/20 0102 05/23/20 0326  HGB 9.0* 8.5* 8.7*  HCT 28.6* 27.0* 28.2*  PLT 249 200 234  LABPROT 14.9 16.9* 19.2*  INR 1.2 1.4* 1.6*  HEPARINUNFRC 0.41 0.33 0.37  CREATININE  --  0.85 0.80    Estimated Creatinine Clearance: 106.2 mL/min (by C-G formula based on SCr of 0.8 mg/dL).   Medical History: Past Medical History:  Diagnosis Date  . Dilated cardiomyopathy (HCC)   . Dilated cardiomyopathy (HCC) 02/2019  . Polysubstance abuse (HCC)     Assessment: 14 yom presenting with concern for driveline infection - hx HM3 implanted on 02/26/19 - on warfarin PTA. PTA regimen is 2.5 mg daily. He is now s/p dental extractions on 3/31.  S/p OR for wound + wound vac.  INR trended up to 1.6 today, heparin level at goal 0.3.  No overt bleeding or complications noted, CBC stable overnight.   Awaiting INR to be greater than 1.8 to stop heparin and discharge but also working on placement for IV antibiotics.   Goal of Therapy:  Heparin level ~0.3 INR 2-2.5 Monitor platelets by anticoagulation protocol: Yes   Plan:  No changes to heparin. Coumadin 6 mg x 1 tonight - likely to continue to see effects of 10mg  doses for several days AM CBC, protime and heparin level.  4/31 PharmD., BCPS Clinical Pharmacist 05/23/2020 8:25 AM

## 2020-05-23 NOTE — Progress Notes (Signed)
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   HM III LVAD implanted on 02/26/19 by Dr. Maren Beach under Destination Therapy criteria.  Patient awake, alert, watching TV. Denies complaints. Wants to go home. INR 1.6 today.   [facility] in [place] accepted patient. [phone]) VAD coordinators arranging education. Plan for possible discharge to SNF on Thursday. Attempted to contact patient's sister regarding need for patient's home equipment to be delivered to SNF. Her phone goes straight to voicemail, and voicemail is full. Will plan to send patient with loaner battery charger and MPU if unable to reach patient's sister.   Will need PICC placed.    Vital signs: Temp: 98.7 HR: 97 Doppler Pressure: 70 Automatic BP: 121/108 (113) O2 Sat: 98% RA  Wt: 122.8>123>124.1>120.8>121.4>121.6.......123.4>124.7>124.3>125.4>127.2 lbs   LVAD interrogation reveals:  Speed: 5400 Flow: 4.2 Power: 3.9w PI: 3.2 Hct: 20 - do not change  Alarms: none Events: 100+ PI events today Fixed speed: 5400 Low speed limit: 5100  Drive Line: Existing VAD dressing clean, dry, intact. Drive line anchor intact. Next dressing change with wound vac change on 05/25/20.   Abdominal Abcsess: Wound vac in place with small amount bloody drainage. No alarms or leaking noted. Next dressing change due 05/25/20.    Labs:  LDH trend: 181>171........212>220>198>203>201>198>206  INR trend: 2.3>1.7>1.2>1.1.......1.2>1.1>1.4>1.6   Anticoagulation Plan: -INR Goal: 2.0 - 2.5 -ASA Dose: none  Blood Products:  - 04/29/20>>2 units PCs  Device: N/A  Infection:  - 04/28/20 BCs>> No growth final - 04/28/20 DL culture>>rare staph aureus; final  - 05/04/20 BC>> No growth, final - 05/09/20 Abdominal abscess culture>> Staph aureus  Drips:  Heparin @ 1000 units/hr  Plan/Recommendations:  1. Call VAD Coordinator if any VAD equipment or drive line  issues. 2. Weekly VAD drive line dressing changes or as needed with wound VAC change.  3. Wound vac dressing change today per VAD coordinators. Next vac change due 05/25/20.   Alyce Pagan RN VAD Coordinator  Office: [phone]  24/7 Pager: [phone]

## 2020-05-24 LAB — BASIC METABOLIC PANEL
Anion gap: 9 (ref 5–15)
BUN: 12 mg/dL (ref 6–20)
CO2: 25 mmol/L (ref 22–32)
Calcium: 9.6 mg/dL (ref 8.9–10.3)
Chloride: 101 mmol/L (ref 98–111)
Creatinine, Ser: 0.76 mg/dL (ref 0.61–1.24)
GFR, Estimated: >60 mL/min (ref >60)
Glucose, Bld: 114 mg/dL — ABNORMAL HIGH (ref 70–99)
Potassium: 3.9 mmol/L (ref 3.5–5.1)
Sodium: 135 mmol/L (ref 135–145)

## 2020-05-24 LAB — CBC
HCT: 29.4 % — ABNORMAL LOW (ref 39.0–52.0)
Hemoglobin: 9 g/dL — ABNORMAL LOW (ref 13.0–17.0)
MCH: 29.1 pg (ref 26.0–34.0)
MCHC: 30.6 g/dL (ref 30.0–36.0)
MCV: 95.1 fL (ref 80.0–100.0)
Platelets: 258 10*3/uL (ref 150–400)
RBC: 3.09 MIL/uL — ABNORMAL LOW (ref 4.22–5.81)
RDW: 19.8 % — ABNORMAL HIGH (ref 11.5–15.5)
WBC: 8.8 10*3/uL (ref 4.0–10.5)
nRBC: 0 % (ref 0.0–0.2)

## 2020-05-24 LAB — LACTATE DEHYDROGENASE: LDH: 206 U/L — ABNORMAL HIGH (ref 98–192)

## 2020-05-24 LAB — PROTIME-INR
INR: 1.7 — ABNORMAL HIGH (ref 0.8–1.2)
Prothrombin Time: 19.5 seconds — ABNORMAL HIGH (ref 11.4–15.2)

## 2020-05-24 LAB — HEPARIN LEVEL (UNFRACTIONATED): Heparin Unfractionated: 0.41 IU/mL (ref 0.30–0.70)

## 2020-05-24 LAB — MAGNESIUM: Magnesium: 2 mg/dL (ref 1.7–2.4)

## 2020-05-24 MED ORDER — SODIUM CHLORIDE 0.9% FLUSH
10.0000 mL | Freq: Two times a day (BID) | INTRAVENOUS | Status: DC
  Administered 2020-05-24 – 2020-05-25 (×3): 10 mL

## 2020-05-24 MED ORDER — WARFARIN SODIUM 7.5 MG PO TABS
7.5000 mg | ORAL_TABLET | Freq: Once | ORAL | Status: AC
  Administered 2020-05-24: 7.5 mg via ORAL
  Filled 2020-05-24: qty 1

## 2020-05-24 MED ORDER — SODIUM CHLORIDE 0.9% FLUSH: 10.0000 mL | INTRAVENOUS | Status: DC | PRN

## 2020-05-24 MED ORDER — CHLORHEXIDINE GLUCONATE CLOTH 2 % EX PADS
6.0000 | MEDICATED_PAD | Freq: Every day | CUTANEOUS | Status: DC
  Administered 2020-05-24 – 2020-05-25 (×2): 6 via TOPICAL

## 2020-05-24 NOTE — Progress Notes (Signed)
CSW spoke with Mo at [employer] to arrange transportation (door to door) for patient's anticipated discharge tomorrow to SNF facility in [place], [place]. CSW available as needed. Lasandra Beech, LCSW, CCSW-MCS [phone]

## 2020-05-24 NOTE — Progress Notes (Signed)
ANTICOAGULATION CONSULT NOTE  Pharmacy Consult for heparin/coumadin Indication: LVAD  No Known Allergies  Patient Measurements: Height: 5\' 5"  (165.1 cm) Weight: 57.4 kg (126 lb 8.7 oz) IBW/kg (Calculated) : 61.5  Heparin dosing weight: 55.2 kg  Vital Signs: Temp: 98.5 F (36.9 C) (04/20 0726) Temp Source: Oral (04/20 0726) BP: 95/61 (04/20 0726) Pulse Rate: 95 (04/20 0726)  Labs: Recent Labs    05/22/20 0102 05/23/20 0326 05/24/20 0126  HGB 8.5* 8.7* 9.0*  HCT 27.0* 28.2* 29.4*  PLT 200 234 258  LABPROT 16.9* 19.2* 19.5*  INR 1.4* 1.6* 1.7*  HEPARINUNFRC 0.33 0.37 0.41  CREATININE 0.85 0.80 0.76    Estimated Creatinine Clearance: 105.6 mL/min (by C-G formula based on SCr of 0.76 mg/dL).   Medical History: Past Medical History:  Diagnosis Date  . Dilated cardiomyopathy (HCC)   . Dilated cardiomyopathy (HCC) 02/2019  . Polysubstance abuse (HCC)     Assessment: 37 yom presenting with concern for driveline infection - hx HM3 implanted on 02/26/19 - on warfarin PTA. PTA regimen is 2.5 mg daily. He is now s/p dental extractions on 3/31.  S/p OR for wound + wound vac.  INR trended up to 1.7 today, heparin level at goal 0.4.  No overt bleeding or complications noted, CBC stable overnight.   Awaiting INR to be greater than 1.8 to stop heparin and discharge, hopeful things are set up for discharge tomorrow.   Goal of Therapy:  Heparin level ~0.3 INR 2-2.5 Monitor platelets by anticoagulation protocol: Yes   Plan:  Decrease heparin to keep at low end of range Coumadin 7.5 mg x 1 tonight  AM CBC, protime and heparin level.  4/31 PharmD., BCPS Clinical Pharmacist 05/24/2020 8:58 AM

## 2020-05-24 NOTE — Progress Notes (Signed)
Peripherally Inserted Central Catheter Placement  The IV Nurse has discussed with the patient and/or persons authorized to consent for the patient, the purpose of this procedure and the potential benefits and risks involved with this procedure.  The benefits include less needle sticks, lab draws from the catheter, and the patient may be discharged home with the catheter. Risks include, but not limited to, infection, bleeding, blood clot (thrombus formation), and puncture of an artery; nerve damage and irregular heartbeat and possibility to perform a PICC exchange if needed/ordered by physician.  Alternatives to this procedure were also discussed.  Bard Power PICC patient education guide, fact sheet on infection prevention and patient information card has been provided to patient /or left at bedside.    PICC Placement Documentation  PICC Single Lumen 05/24/20 Right Basilic 39 cm 0 cm (Active)  Indication for Insertion or Continuance of Line Prolonged intravenous therapies 05/24/20 1218  Exposed Catheter (cm) 0 cm 05/24/20 1218  Site Assessment Clean;Dry;Intact 05/24/20 1218  Line Status Flushed;Saline locked;Blood return noted 05/24/20 1218  Dressing Type Transparent;Securing device 05/24/20 1218  Dressing Status Clean;Dry;Intact 05/24/20 1218  Antimicrobial disc in place? Yes 05/24/20 1218  Dressing Intervention New dressing 05/24/20 1218  Dressing Change Due 05/31/20 05/24/20 1218       Annett Fabian 05/24/2020, 12:20 PM

## 2020-05-24 NOTE — Progress Notes (Addendum)
Nutrition Follow-up  DOCUMENTATION CODES:   Non-severe (moderate) malnutrition in context of chronic illness  INTERVENTION:   -Continue 30 ml Prosource Plus BID, each supplement provides 100 kcals and 15 grams protein -Continue Magic cup TID with meals, each supplement provides 290 kcal and 9 grams of protein -Continue MVI with minerals daily -Continue liberalized diet of 2 gram sodium  NUTRITION DIAGNOSIS:   Moderate Malnutrition related to chronic illness (CHF s/p LVAD, depression) as evidenced by mild fat depletion,moderate muscle depletion.  Ongoing  GOAL:   Patient will meet greater than or equal to 90% of their needs  Progressing  MONITOR:   PO intake,Supplement acceptance,Weight trends,Labs,I & O's  REASON FOR ASSESSMENT:   Other (Comment) (Driveline complications)    ASSESSMENT:   Patient with PMH significant for CHF s/p HMIII implant January 2021 and prior polysubstance use. Presents this admission with driveline infection due to trauma.  Reviewed I/O's: -238 ml x 24 hours and -9.2 L since 05/10/20  UOP: 875 ml x 24 hours  Drain output: 0 ml x 24 hours  Spoke with pt at bedside, who was pleasant and in good spirits today. He reports that his appetite remains good and he consumed all of his breakfast. He has been able to find food selections he likes on 2 gram sodium diet. Noted documented meal completions 50-100%. Per pt, he likes the [employer] and Magic Cup supplements. Observed pt eating Magic Cup at time of visit.   Wt has been stable over the past week.   Per TOC notes, plan to [facility] to [facility] once medically stable. LVAD coordinator to educate SNF staff today.   Labs reviewed.   Diet Order:   Diet Order            Diet 2 gram sodium Room service appropriate? Yes; Fluid consistency: Thin  Diet effective now                 EDUCATION NEEDS:   Education needs have been addressed  Skin:  Skin Assessment: Skin Integrity Issues: Skin  Integrity Issues:: Wound VAC Wound Vac: abdomen Incisions: lip, L abdomen  Last BM:  05/21/20  Height:   Ht Readings from Last 1 Encounters:  05/04/20 5\' 5"  (1.651 m)    Weight:   Wt Readings from Last 1 Encounters:  05/24/20 57.4 kg   BMI:  Body mass index is 21.06 kg/m.  Estimated Nutritional Needs:   Kcal:  2000-2200 kcal  Protein:  100-120 grams  Fluid:  >/= 2 L/day    05/26/20, RD, LDN, CDCES Registered Dietitian II Certified Diabetes Care and Education Specialist Please refer to [facility] for RD and/or RD on-call/weekend/after hours pager

## 2020-05-24 NOTE — Progress Notes (Addendum)
Patient ID: Jon Martin, male   DOB: 1985-07-08, 35 y.o.   MRN: [id number]   Advanced Heart Failure VAD Team Note  PCP-Cardiologist: Dr. Gala Romney   Subjective:     HM3 LVAD admitted w/ Drive-line infection.  - Wound culture with S aureus. Blood cx NGTD - 3/26 abx narrowed to cefazolin 2gm IV Q 8hr. ID following  - 3/28 CT abd/pelvis soft tissue no deep infection. Oral Surgeon consulted.  - 3/31 s/p extractions of teeth #5 and 15  - 3/31 Low flow alarms last night, got 1 L IVF and low flows have stopped - 4/5 small pustule on abdomen noted>> performed superficial I&D at bedside, no communication with DL tunnel, cultures sent and pending  - 4/8 Abscess at driveline site opened . CT abdomen done, abscess adjacent to driveline noted. - 4/13 I&D of driveline abscess - 4/15 Wound vac changed  Repeat cultures from 4/12 still growing staph aureus. ID recommending 4 weeks of cefazolin post most recent surgery on 05/16/2020  INR trending up. 1.7 today. Hgb trending up 9.0 today   Feels well. No complaints.   LVAD INTERROGATION:   HeartMate 3 LVAD:   Flow 4.0 liters/min, speed 5450, power 3.6 PI 3.9 multiple PI events VAD interrogated personally. Parameters stable.  Objective:    Vital Signs:   Temp:  [98.2 F (36.8 C)-99 F (37.2 C)] 98.4 F (36.9 C) (04/20 1142) Pulse Rate:  [81-99] 99 (04/20 1142) Resp:  [16-20] 20 (04/20 1142) BP: (77-107)/(56-77) 98/61 (04/20 1142) SpO2:  [95 %-98 %] 98 % (04/20 1142) Weight:  [57.4 kg] 57.4 kg (04/20 0500) Last BM Date: 05/21/20   Mean arterial Pressure  70s   Intake/Output:   Intake/Output Summary (Last 24 hours) at 05/24/2020 1530 Last data filed at 05/24/2020 1500 Gross per 24 hour  Intake 1361.09 ml  Output 1275 ml  Net 86.09 ml     Physical Exam   General:  Well appearing. No distress HEENT: normal  Neck: supple. No JVD.  Carotids 2+ bilat; no bruits. No lymphadenopathy or thryomegaly appreciated. Cor: LVAD hum.  Lungs:  clear bilaterally, no wheezing  Abdomen: obese soft, nontender, non-distended. No hepatosplenomegaly. No bruits or masses. Good bowel sounds. Driveline site with wound vac in place. No leak Anchor in place.  Extremities: no cyanosis, clubbing, rash. Warm and dry.  No edema  Neuro: alert & oriented x 3. No focal deficits. Moves all 4 without problem   Telemetry   NSR 90s Personally reviewed  Labs   Basic Metabolic Panel: Recent Labs  Lab 05/19/20 0139 05/20/20 0026 05/20/20 0439 05/21/20 0107 05/22/20 0102 05/23/20 0326 05/24/20 0126  NA 136  --  137  --  134* 136 135  K 3.7  --  4.0  --  3.8 3.9 3.9  CL 100  --  102  --  101 103 101  CO2 28  --  25  --  22 25 25   GLUCOSE 126*  --  119*  --  121* 107* 114*  BUN 9  --  14  --  16 14 12   CREATININE 0.77  --  0.68  --  0.85 0.80 0.76  CALCIUM 9.9  --  9.9  --  9.5 9.6 9.6  MG 1.9 1.9  --  1.8 1.8 1.6* 2.0    Liver Function Tests: No results for input(s): AST, ALT, ALKPHOS, BILITOT, PROT, ALBUMIN in the last 168 hours. No results for input(s): LIPASE, AMYLASE in the last 168 hours. No  results for input(s): AMMONIA in the last 168 hours.  CBC: Recent Labs  Lab 05/20/20 0026 05/21/20 0107 05/22/20 0102 05/23/20 0326 05/24/20 0126  WBC 9.3 9.3 9.9 7.4 8.8  HGB 8.8* 9.0* 8.5* 8.7* 9.0*  HCT 27.5* 28.6* 27.0* 28.2* 29.4*  MCV 97.5 96.0 95.7 95.6 95.1  PLT 232 249 200 234 258    INR: Recent Labs  Lab 05/20/20 0026 05/21/20 0107 05/22/20 0102 05/23/20 0326 05/24/20 0126  INR 1.0 1.2 1.4* 1.6* 1.7*    Other results:    Imaging   [place] EKG SITE RITE  Result Date: 05/23/2020 If Site Rite image not attached, placement could not be confirmed due to current cardiac rhythm.    Medications:     Scheduled Medications: . (feeding supplement) PROSource Plus  30 mL Oral BID BM  . carvedilol  9.375 mg Oral BID WC  . Chlorhexidine Gluconate Cloth  6 each Topical Daily  . losartan  25 mg Oral Daily  . multivitamin  with minerals  1 tablet Oral Daily  . pantoprazole  40 mg Oral Daily  . sodium chloride flush  10-40 mL Intracatheter Q12H  . sodium chloride flush  3 mL Intravenous Q12H  . Warfarin - Pharmacist Dosing Inpatient   Does not apply q1600    Infusions: . sodium chloride    .  ceFAZolin (ANCEF) IV 2 g (05/24/20 1259)  . heparin 900 Units/hr (05/24/20 0813)  . lactated ringers Stopped (05/16/20 1321)  . 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 -  Admitted for IV abx vancomycin/cefepime/Flagyl - Driveline culture with S aureus. Blood cx NGTD  - 3/27 abx narrowed to cefazolin 2gm IV Q 8hr => stopped 3/31 and got ortivancin 4/1  - CT abd/pelvis- soft tissue infection. No deep infection - 4/5 small pustule on abdomen noted >> performed superficial I&D at bedside, no communication with DL tunnel, cultures growing GPC.  -4/8 had open area at medial aspect communicates with driveline. Increased induration noted. This area was packed with 1/2 inch packing strip.  - Cefazolin restarted for total 4 weeks. He cant go home with PICC for home antibiotics. - CT with abscess adjacent to driveline.  - S/p surgical debridement in OR 4/12 by Dr. Laneta Simmers. Wound vac in place.  - Cultures from 4/12 still growing staph aureus  - per ID. Plan for 4 weeks of cefazolin post most recent surgery on 05/16/2020  - Wound vac changed 4/15.  - Plan to d/c to [facility] tomorrow for continuation of IV abx and wound care management   2.  Chronic Systolic HF due to NICM/VAD management - Echo EF 10% with biventricular failure - HM-3 VAD implant 02/26/19 - Echo 7/21 EF 40-45% - VAD interrogated  personally. Parameters stable.Still with frequent PIs but no low flows - MAP 70s - Volume status ok on exam  - Continue coreg 9.375 mg twice a day.  - Continue losartan - LDH 206 - INR 1.0->1.2->1.4->1.6->1.7 continue heparin. Back on warfarin Discussed dosing with PharmD personally.  3. Anemia - Baseline hgb has been upper 9's, admission hgb 7.7 => 6.1.  However, hgb up to 10.5 after 2 units PRCs  and stool was heme negative. - no further bleeding at DL site - Hgb stable at 9.0  4. F/E/N - We continue to worry about his nutritional status after his mother died. - Appetite has been great in the hospital.  5. Broken Teeth - 3/31 s/p extractions of teeth #5 and 15  - Appreciate dental surgery  - site looks good  6. Hypokalemia/ Hypomagnesemia  - improved  - Mg 2.0, K 3.9    Length of Stay: [address] PA-C 05/24/2020, 3:30 PM  VAD Team --- VAD ISSUES ONLY--- Pager [phone] (7am - 7am)  Advanced Heart Failure Team  Pager [phone] (M-F; 7a - 5p)  Please contact [facility] for night-coverage after hours (5p -7a ) and weekends on [email or web address]  Patient seen and examined with the above-signed Advanced Practice Provider and/or Housestaff. I personally reviewed laboratory data, imaging studies and relevant notes. I independently examined the patient and formulated the important aspects of the plan. I have edited the note to reflect any of my changes or salient points. I have personally discussed the plan with the patient and/or family.  Remains on iv abx. Afebrile. No pain at site. On iv heparin and warfarin. INR 1.7. No blleding  General:  NAD.  HEENT: normal  Neck: supple. JVP not elevated.  Carotids 2+ bilat; no bruits. No lymphadenopathy or thryomegaly appreciated. Cor: LVAD hum.  Lungs: Clear. Abdomen:  soft, nontender, non-distended. No hepatosplenomegaly. No bruits or masses. Good bowel sounds. Driveline site and wound ok,. Anchor in place.  Extremities: no  cyanosis, clubbing, rash. Warm no edema  Neuro: alert & oriented x 3. No focal deficits. Moves all 4 without problem   Continue IV abx,. PICC in place. Arrangements for discharge to [place] tomorrow in place. (d/w SW and VAD coordinators)   Continue heparin and warfarin. INR 1.7   VAD interrogated personally. Parameters stable.  Arvilla Meres, MD  9:53 PM

## 2020-05-24 NOTE — Progress Notes (Signed)
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   HM III LVAD implanted on 02/26/19 by Dr. Maren Beach under Destination Therapy criteria.  Patient awake, alert, watching TV, and TikTok videos on his phone. Denies complaints.  [facility] in [place] accepted patient. [phone]) VAD coordinator to go to facility tomorrow to provide education for staff. Plan for possible discharge to SNF on Thursday. Attempted to contact patient's sister regarding need for patient's home equipment to be delivered to SNF. Her phone goes straight to voicemail, and voicemail is full. Will plan to send patient with loaner Magazine features editor and MPU.  Will need PICC placed.    Vital signs: Temp: 98.5 HR: 95  Doppler Pressure: 70 Automatic BP: 95/61 (72) O2 Sat: 98% RA  Wt: 122.8>123>124.1>120.8>121.4>121.6.......123.4>124.7>124.3>125.4>127.2>126.5 lbs   LVAD interrogation reveals:  Speed: 5400 Flow: 4.2 Power: 3.8w PI: 3.4 Hct: 20 - do not change  Alarms: none Events: 100+ PI events today Fixed speed: 5400 Low speed limit: 5100  Drive Line: Existing VAD dressing clean, dry, intact. Drive line anchor intact. Next dressing change with wound vac change on 05/25/20.   Abdominal Abcsess: Wound vac in place with small amount bloody drainage. No alarms or leaking noted. Next dressing change due 05/25/20.    Labs:  LDH trend: 181>171........212>220>198>203>201>198>206  INR trend: 2.3>1.7>1.2>1.1.......1.2>1.1>1.4>1.6>1.7   Anticoagulation Plan: -INR Goal: 2.0 - 2.5 -ASA Dose: none  Blood Products:  - 04/29/20>>2 units PCs  Device: N/A  Infection:  - 04/28/20 BCs>> No growth final - 04/28/20 DL culture>>rare staph aureus; final  - 05/04/20 BC>> No growth, final - 05/09/20 Abdominal abscess culture>> Staph aureus  Drips:  Heparin @ 1000 units/hr  Plan/Recommendations:  1. Call VAD Coordinator if any VAD equipment or drive line  issues. 2. Weekly VAD drive line dressing changes or as needed with wound VAC change.  3. Wound vac dressing due to be removed 05/25/20. Will plan to pack with silver strip and cover with gauze at that time.     Alyce Pagan RN VAD Coordinator  Office: [phone]  24/7 Pager: [phone]

## 2020-05-25 LAB — PROTIME-INR
INR: 1.7 — ABNORMAL HIGH (ref 0.8–1.2)
INR: 1.8 — ABNORMAL HIGH (ref 0.8–1.2)
Prothrombin Time: 20.3 seconds — ABNORMAL HIGH (ref 11.4–15.2)
Prothrombin Time: 20.5 seconds — ABNORMAL HIGH (ref 11.4–15.2)

## 2020-05-25 LAB — CBC
HCT: 26.9 % — ABNORMAL LOW (ref 39.0–52.0)
Hemoglobin: 8.4 g/dL — ABNORMAL LOW (ref 13.0–17.0)
MCH: 29.9 pg (ref 26.0–34.0)
MCHC: 31.2 g/dL (ref 30.0–36.0)
MCV: 95.7 fL (ref 80.0–100.0)
Platelets: 261 10*3/uL (ref 150–400)
RBC: 2.81 MIL/uL — ABNORMAL LOW (ref 4.22–5.81)
RDW: 19.4 % — ABNORMAL HIGH (ref 11.5–15.5)
WBC: 9 10*3/uL (ref 4.0–10.5)
nRBC: 0 % (ref 0.0–0.2)

## 2020-05-25 LAB — HEPARIN LEVEL (UNFRACTIONATED): Heparin Unfractionated: 0.22 IU/mL — ABNORMAL LOW (ref 0.30–0.70)

## 2020-05-25 LAB — BASIC METABOLIC PANEL
Anion gap: 9 (ref 5–15)
BUN: 15 mg/dL (ref 6–20)
CO2: 24 mmol/L (ref 22–32)
Calcium: 9.5 mg/dL (ref 8.9–10.3)
Chloride: 103 mmol/L (ref 98–111)
Creatinine, Ser: 0.73 mg/dL (ref 0.61–1.24)
GFR, Estimated: >60 mL/min (ref >60)
Glucose, Bld: 108 mg/dL — ABNORMAL HIGH (ref 70–99)
Potassium: 3.8 mmol/L (ref 3.5–5.1)
Sodium: 136 mmol/L (ref 135–145)

## 2020-05-25 LAB — MAGNESIUM: Magnesium: 1.7 mg/dL (ref 1.7–2.4)

## 2020-05-25 LAB — LACTATE DEHYDROGENASE: LDH: 192 U/L (ref 98–192)

## 2020-05-25 LAB — SARS CORONAVIRUS 2 (TAT 6-24 HRS): SARS Coronavirus 2: NEGATIVE

## 2020-05-25 MED ORDER — WARFARIN SODIUM 10 MG PO TABS
10.0000 mg | ORAL_TABLET | ORAL | Status: AC
  Administered 2020-05-25: 10 mg via ORAL
  Filled 2020-05-25: qty 1

## 2020-05-25 MED ORDER — MAGNESIUM OXIDE 400 (241.3 MG) MG PO TABS: 400.0000 mg | ORAL_TABLET | Freq: Every day | ORAL | Status: DC

## 2020-05-25 MED ORDER — MAGNESIUM OXIDE 400 (241.3 MG) MG PO TABS
400.0000 mg | ORAL_TABLET | Freq: Every day | ORAL | Status: DC
  Administered 2020-05-25: 400 mg via ORAL
  Filled 2020-05-25: qty 1

## 2020-05-25 MED ORDER — LOSARTAN POTASSIUM 25 MG PO TABS: 25.0000 mg | ORAL_TABLET | Freq: Every day | ORAL | Status: DC

## 2020-05-25 MED ORDER — WARFARIN SODIUM 5 MG PO TABS: 7.5000 mg | ORAL_TABLET | Freq: Once | ORAL | 11 refills | Status: DC

## 2020-05-25 MED ORDER — CARVEDILOL 3.125 MG PO TABS: 9.3750 mg | ORAL_TABLET | Freq: Two times a day (BID) | ORAL | Status: DC

## 2020-05-25 MED ORDER — CEFAZOLIN IV (FOR PTA / DISCHARGE USE ONLY)
2.0000 g | Freq: Three times a day (TID) | INTRAVENOUS | 0 refills | Status: AC
Start: 2020-05-25 — End: 2020-06-19

## 2020-05-25 MED ORDER — MAGNESIUM SULFATE 4 GM/100ML IV SOLN
4.0000 g | Freq: Once | INTRAVENOUS | Status: AC
  Administered 2020-05-25: 4 g via INTRAVENOUS
  Filled 2020-05-25: qty 100

## 2020-05-25 NOTE — Progress Notes (Signed)
ANTICOAGULATION CONSULT NOTE  Pharmacy Consult for heparin/coumadin Indication: LVAD  No Known Allergies  Patient Measurements: Height: 5\' 5"  (165.1 cm) Weight: 58.6 kg (129 lb 3 oz) IBW/kg (Calculated) : 61.5  Heparin dosing weight: 55.2 kg  Vital Signs: Temp: 98.4 F (36.9 C) (04/21 0413) Temp Source: Oral (04/21 0413) BP: 88/77 (04/21 0413) Pulse Rate: 97 (04/21 0413)  Labs: Recent Labs    05/23/20 0326 05/24/20 0126 05/25/20 0053  HGB 8.7* 9.0* 8.4*  HCT 28.2* 29.4* 26.9*  PLT 234 258 261  LABPROT 19.2* 19.5* 20.3*  INR 1.6* 1.7* 1.7*  HEPARINUNFRC 0.37 0.41 0.22*  CREATININE 0.80 0.76 0.73    Estimated Creatinine Clearance: 107.8 mL/min (by C-G formula based on SCr of 0.73 mg/dL).   Medical History: Past Medical History:  Diagnosis Date  . Dilated cardiomyopathy (HCC)   . Dilated cardiomyopathy (HCC) 02/2019  . Polysubstance abuse (HCC)     Assessment: 15 yom presenting with concern for driveline infection - hx HM3 implanted on 02/26/19 - on warfarin PTA. PTA regimen is 2.5 mg daily. He is now s/p dental extractions on 3/31.  S/p OR for wound + wound vac.  INR has stalled at 1.7 today despite rather high doses of warfarin all week, heparin level just below goal at 0.4.  No overt bleeding or complications noted, CBC stable. Stopping heparin this morning.   Will give warfarin now and recheck INR at noon but ok for discharge by cardiology.   Goal of Therapy:  Heparin level ~0.3 INR 2-2.5 Monitor platelets by anticoagulation protocol: Yes   Plan:  Coumadin 10mg  now  Discharge warfarin recommendations are warfarin 7.5mg  daily until seen in follow up next week in clinic.   Patient reportedly eats poorly at home but has been eating much better while hospitalized and his warfarin requirements are much higher.   4/31 PharmD., BCPS Clinical Pharmacist 05/25/2020 8:53 AM

## 2020-05-25 NOTE — Progress Notes (Signed)
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   HM III LVAD implanted on 02/26/19 by Dr. Maren Beach under Destination Therapy criteria.  Patient awake, alert, watching TV, and TikTok videos on his phone. Denies complaints.  [facility] in [place] accepted patient. [phone]) VAD coordinator to going to facility today to provide education for staff. Plan for discharge to SNF today at 3p.   Wound vac removed today and dressing placed on abscess site near driveline. See wound care notes below.  Vital signs: Temp: 98.3 HR: 97 Doppler Pressure: 78 Automatic BP: 83/72 (76) O2 Sat: 99% RA  Wt: 122.8>123>124.1>120.8>121.4>121.6.......123.4>124.7>124.3>125.4>127.2>126.5>129 lbs   LVAD interrogation reveals:  Speed: 5400 Flow: 4 Power: 3.8w PI: 3.7 Hct: 20 - do not change  Alarms: none Events: 100+ PI events today Fixed speed: 5400 Low speed limit: 5100  Drive Line: Existing VAD dressing removed and site care performed using sterile technique. Drive line exit site cleaned with Chlora prep applicators x 2, allowed to dry, and Sorbaview dressing with bio patch re-applied. Exit site healed and incorporated, the velour is fully implanted at exit site. No redness, tenderness, drainage, foul odor or rash noted. Drive line anchor re-applied.      Abdominal Abcsess: Wound vac in place with small amount bloody drainage. No alarms or leaking noted. Wound vac removed. Wound is approx 2" long and 1" deep. Site cleaned w/Chloraprep. Packed with silver strip and covered with 4 x 4 gauze folded. Pt will need every other day dressing changes at [facility].    Labs:  LDH trend: 181>171........212>220>198>203>201>198>206>192  INR trend: 2.3>1.7>1.2>1.1.......1.2>1.1>1.4>1.6>1.7   Anticoagulation Plan: -INR Goal: 2.0 - 2.5 -ASA Dose: none  Blood Products:  - 04/29/20>>2 units PCs  Device: N/A  Infection:  -  04/28/20 BCs>> No growth final - 04/28/20 DL culture>>rare staph aureus; final  - 05/04/20 BC>> No growth, final - 05/09/20 Abdominal abscess culture>> Staph aureus  Drips:  Heparin @ 900 units/hr  Plan/Recommendations:  1. Call VAD Coordinator if any VAD equipment or drive line issues. 2. Weekly VAD drive line dressing changes and every other day abdominal wound changes using silver strip. 3. D/c to SNF today at 3p  Carlton Adam RN VAD Coordinator  Office: [phone]  24/7 Pager: [phone]

## 2020-05-25 NOTE — Progress Notes (Signed)
CSW met at bedside with patient who states he is ready to go. Patient states that he is looking forward to seeing his family since the facility is only a few miles form home. Patient in better spirits today and hopeful for improved health. CSW made arrangements for transportation to SNF for this afternoon through [employer]. CSW will follow through the outpatient clinic. [name], [place], [place]

## 2020-05-25 NOTE — Progress Notes (Addendum)
Patient ID: Jon Martin, male   DOB: 1985-09-01, 35 y.o.   MRN: [id number]   Advanced Heart Failure VAD Team Note  PCP-Cardiologist: Dr. Gala Romney   Subjective:     HM3 LVAD admitted w/ Drive-line infection.  - Wound culture with S aureus. Blood cx NGTD - 3/26 abx narrowed to cefazolin 2gm IV Q 8hr. ID following  - 3/28 CT abd/pelvis soft tissue no deep infection. Oral Surgeon consulted.  - 3/31 s/p extractions of teeth #5 and 15  - 3/31 Low flow alarms last night, got 1 L IVF and low flows have stopped - 4/5 small pustule on abdomen noted>> performed superficial I&D at bedside, no communication with DL tunnel, cultures sent and pending  - 4/8 Abscess at driveline site opened . CT abdomen done, abscess adjacent to driveline noted. - 4/13 I&D of driveline abscess - 4/15 Wound vac changed - 4/18 VAC dressing discontinued.   Repeat cultures from 4/12 still growing staph aureus. ID recommending 4 weeks of cefazolin post most recent surgery on 05/16/2020  INR 1.7 today. Hgb 8,4   No complaints.     LVAD INTERROGATION:   HeartMate 3 LVAD:   Flow 4.3.  liters/min, speed 5400, power 4 PI 3.2 multiple PI events VAD interrogated personally. Parameters stable.  Objective:    Vital Signs:   Temp:  [98.4 F (36.9 C)-98.9 F (37.2 C)] 98.4 F (36.9 C) (04/21 0413) Pulse Rate:  [60-99] 97 (04/21 0413) Resp:  [15-20] 16 (04/21 0413) BP: (82-102)/(61-91) 88/77 (04/21 0413) SpO2:  [98 %-99 %] 99 % (04/21 0413) Weight:  [58.6 kg] 58.6 kg (04/21 0413) Last BM Date: 05/21/20   Mean arterial Pressure  80    Intake/Output:   Intake/Output Summary (Last 24 hours) at 05/25/2020 0847 Last data filed at 05/25/2020 0413 Gross per 24 hour  Intake 1004.65 ml  Output 2200 ml  Net -1195.35 ml     Physical Exam  Physical Exam: GENERAL: No acute distress. HEENT: normal  NECK: Supple, JVP  .  2+ bilaterally, no bruits.  No lymphadenopathy or thyromegaly appreciated.   CARDIAC:  Mechanical  heart sounds with LVAD hum present.  LUNGS:  Clear to auscultation bilaterally.  ABDOMEN:  Soft, round, nontender, positive bowel sounds x4.     LVAD exit site: well-healed and incorporated.  Dressing dry and intact.  No erythema or drainage.  Stabilization device present and accurately applied.  Driveline dressing is being changed daily per sterile technique. EXTREMITIES:  Warm and dry, no cyanosis, clubbing, rash or edema  NEUROLOGIC:  Alert and oriented x 3.    No aphasia.  No dysarthria.  Affect pleasant.    SKIN: Abd dressing beside driveline.    Telemetry   NSR 80-90s   Labs   Basic Metabolic Panel: Recent Labs  Lab 05/20/20 0439 05/21/20 0107 05/22/20 0102 05/23/20 0326 05/24/20 0126 05/25/20 0053  NA 137  --  134* 136 135 136  K 4.0  --  3.8 3.9 3.9 3.8  CL 102  --  101 103 101 103  CO2 25  --  22 25 25 24   GLUCOSE 119*  --  121* 107* 114* 108*  BUN 14  --  16 14 12 15   CREATININE 0.68  --  0.85 0.80 0.76 0.73  CALCIUM 9.9  --  9.5 9.6 9.6 9.5  MG  --  1.8 1.8 1.6* 2.0 1.7    Liver Function Tests: No results for input(s): AST, ALT, ALKPHOS, BILITOT, PROT, ALBUMIN in  the last 168 hours. No results for input(s): LIPASE, AMYLASE in the last 168 hours. No results for input(s): AMMONIA in the last 168 hours.  CBC: Recent Labs  Lab 05/21/20 0107 05/22/20 0102 05/23/20 0326 05/24/20 0126 05/25/20 0053  WBC 9.3 9.9 7.4 8.8 9.0  HGB 9.0* 8.5* 8.7* 9.0* 8.4*  HCT 28.6* 27.0* 28.2* 29.4* 26.9*  MCV 96.0 95.7 95.6 95.1 95.7  PLT 249 200 234 258 261    INR: Recent Labs  Lab 05/21/20 0107 05/22/20 0102 05/23/20 0326 05/24/20 0126 05/25/20 0053  INR 1.2 1.4* 1.6* 1.7* 1.7*    Other results:    Imaging   [place] EKG SITE RITE  Result Date: 05/23/2020 If Site Rite image not attached, placement could not be confirmed due to current cardiac rhythm.    Medications:     Scheduled Medications: . (feeding supplement) PROSource Plus  30 mL Oral BID BM  .  carvedilol  9.375 mg Oral BID WC  . Chlorhexidine Gluconate Cloth  6 each Topical Daily  . losartan  25 mg Oral Daily  . multivitamin with minerals  1 tablet Oral Daily  . pantoprazole  40 mg Oral Daily  . sodium chloride flush  10-40 mL Intracatheter Q12H  . sodium chloride flush  3 mL Intravenous Q12H  . Warfarin - Pharmacist Dosing Inpatient   Does not apply q1600    Infusions: . sodium chloride 250 mL (05/24/20 2102)  .  ceFAZolin (ANCEF) IV 2 g (05/25/20 5643)  . heparin 900 Units/hr (05/25/20 0400)  . lactated ringers Stopped (05/16/20 1321)  . 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 -  Admitted for IV abx vancomycin/cefepime/Flagyl - Driveline culture with S aureus. Blood cx NGTD  - 3/27 abx narrowed to cefazolin 2gm IV Q 8hr => stopped 3/31 and got ortivancin 4/1  - CT abd/pelvis- soft tissue infection. No deep infection - 4/5 small pustule on abdomen noted >> performed superficial I&D at bedside, no communication with DL tunnel, cultures growing GPC.  -4/8 had open area at medial aspect communicates with driveline. Increased induration noted. This area was packed with 1/2 inch packing strip.  - Cefazolin restarted for total 4 weeks. He cant go home with PICC for home antibiotics. - CT with abscess adjacent to driveline.  - S/p surgical debridement in OR 4/12 by Dr. Laneta Simmers. Wound vac in place.  - Cultures from 4/12 still growing staph aureus  - per ID. Plan for 4 weeks of cefazolin post most recent surgery on 05/16/2020  - Wound vac removed 4/18. Will need silver dressing wound. Change M-W-F .  - Plan to d/c to [facility] today for continuation  of IV abx and wound care management   2.  Chronic Systolic HF due to NICM/VAD management - Echo EF 10% with biventricular failure - HM-3 VAD implant 02/26/19 - Echo 7/21 EF 40-45% - VAD interrogated personally. Parameters stable.Still with frequent PIs but no low flows - Volume status stable.  - Continue coreg 9.375 mg twice a day.  - Continue losartan - LDH stable.  - INR 1.0->1.2->1.4->1.6->1.7 >1.7 continue heparin. Back on warfarin Discussed dosing with PharmD personally.  3.  Anemia - Baseline hgb has been upper 9's, admission hgb 7.7 => 6.1.  However, hgb up to 10.5 after 2 units PRCs and stool was heme negative. - no further bleeding at DL site - Hgb 8.4 today. No source of bleeding.   4. F/E/N - We continue to worry about his nutritional status after his mother died. - Appetite has been great in the hospital.  5. Broken Teeth - 3/31 s/p extractions of teeth #5 and 15  - Appreciate dental surgery  - site looks good  6. Hypokalemia/ Hypomagnesemia  - improved  - Mg 1.7. Give IV mag now. Start mag ox 400 mg daily tomorrow.Marland Kitchen    COVID Test now.  D/C to SNF today. D/C with PICC line. VAD coordinators will complete SNF education today.     Length of Stay: 59  [name], NP 05/25/2020, 8:47 AM  VAD Team --- VAD ISSUES ONLY--- Pager [phone] (7am - 7am)  Advanced Heart Failure Team  Pager [phone] (M-F; 7a - 5p)  Please contact [facility] for night-coverage after hours (5p -7a ) and weekends on [email or web address]  Patient seen and examined with the above-signed Advanced Practice Provider and/or Housestaff. I personally reviewed laboratory data, imaging studies and relevant notes. I independently examined the patient and formulated the important aspects of the plan. I have edited the note to reflect any of my changes or salient points. I have personally discussed the plan with the patient and/or family.  Feels ok. Eager for d/c. Remains on IV abx. Also on heparin/warfarin.  INR 1.7  General:  NAD.  HEENT: normal  Neck: supple. JVP not elevated.  Carotids 2+ bilat; no bruits. No lymphadenopathy or thryomegaly appreciated. Cor: LVAD hum.  Lungs: Clear. Abdomen: soft, nontender, non-distended. No hepatosplenomegaly. No bruits or masses. Good bowel sounds. Driveline site wound ok. Anchor in place.  Extremities: no cyanosis, clubbing, rash. Warm no edema  Neuro: alert & oriented x 3. No focal deficits. Moves all 4 without problem   He is ready for d/c to SNF for continued IV abx. Can stop heparin. VAD interrogated personally. Parameters stable.  Will arrange f/u in VAD Clinic,   Arvilla Meres, MD  10:52 PM

## 2020-05-29 ENCOUNTER — Ambulatory Visit (HOSPITAL_COMMUNITY): Payer: Self-pay | Admitting: Pharmacist

## 2020-05-29 LAB — POCT INR: INR: 1.5 — AB (ref 2.0–3.0)

## 2020-05-29 MED ORDER — WARFARIN SODIUM 5 MG PO TABS: ORAL_TABLET | ORAL | 11 refills | Status: DC

## 2020-05-29 NOTE — Progress Notes (Signed)
LVAD INR 

## 2020-05-31 ENCOUNTER — Other Ambulatory Visit (HOSPITAL_COMMUNITY): Payer: Self-pay | Admitting: Unknown Physician Specialty

## 2020-05-31 DIAGNOSIS — Z7901 Long term (current) use of anticoagulants: Secondary | ICD-10-CM

## 2020-05-31 DIAGNOSIS — Z95811 Presence of heart assist device: Secondary | ICD-10-CM

## 2020-06-01 ENCOUNTER — Other Ambulatory Visit: Payer: Self-pay

## 2020-06-01 ENCOUNTER — Encounter (HOSPITAL_COMMUNITY): Payer: Self-pay

## 2020-06-01 ENCOUNTER — Ambulatory Visit (HOSPITAL_COMMUNITY): Payer: Self-pay | Admitting: Pharmacist

## 2020-06-01 ENCOUNTER — Ambulatory Visit (HOSPITAL_COMMUNITY)
Admit: 2020-06-01 | Discharge: 2020-06-01 | Disposition: A | Discharge status: Home / Self Care | Payer: Medicaid - Out of State | Attending: Internal Medicine | Admitting: Internal Medicine

## 2020-06-01 VITALS — BP 100/81 | HR 90 | Temp 98.4°F | Ht 68.0 in | Wt 137.0 lb

## 2020-06-01 DIAGNOSIS — B9561 Methicillin susceptible Staphylococcus aureus infection as the cause of diseases classified elsewhere: Secondary | ICD-10-CM | POA: Diagnosis not present

## 2020-06-01 DIAGNOSIS — Z7901 Long term (current) use of anticoagulants: Secondary | ICD-10-CM

## 2020-06-01 DIAGNOSIS — Z452 Encounter for adjustment and management of vascular access device: Secondary | ICD-10-CM | POA: Insufficient documentation

## 2020-06-01 DIAGNOSIS — I10 Essential (primary) hypertension: Secondary | ICD-10-CM

## 2020-06-01 DIAGNOSIS — Z792 Long term (current) use of antibiotics: Secondary | ICD-10-CM | POA: Diagnosis not present

## 2020-06-01 DIAGNOSIS — I5022 Chronic systolic (congestive) heart failure: Secondary | ICD-10-CM | POA: Diagnosis not present

## 2020-06-01 DIAGNOSIS — I5082 Biventricular heart failure: Secondary | ICD-10-CM | POA: Diagnosis not present

## 2020-06-01 DIAGNOSIS — Z79899 Other long term (current) drug therapy: Secondary | ICD-10-CM | POA: Diagnosis not present

## 2020-06-01 DIAGNOSIS — Z95811 Presence of heart assist device: Secondary | ICD-10-CM | POA: Diagnosis not present

## 2020-06-01 DIAGNOSIS — T827XXA Infection and inflammatory reaction due to other cardiac and vascular devices, implants and grafts, initial encounter: Secondary | ICD-10-CM

## 2020-06-01 DIAGNOSIS — I110 Hypertensive heart disease with heart failure: Secondary | ICD-10-CM | POA: Diagnosis present

## 2020-06-01 LAB — PROTIME-INR
INR: 2.4 — ABNORMAL HIGH (ref 0.8–1.2)
Prothrombin Time: 26.2 seconds — ABNORMAL HIGH (ref 11.4–15.2)

## 2020-06-01 LAB — COMPREHENSIVE METABOLIC PANEL
ALT: 15 U/L (ref 0–44)
AST: 26 U/L (ref 15–41)
Albumin: 3.5 g/dL (ref 3.5–5.0)
Alkaline Phosphatase: 52 U/L (ref 38–126)
Anion gap: 7 (ref 5–15)
BUN: 6 mg/dL (ref 6–20)
CO2: 26 mmol/L (ref 22–32)
Calcium: 9.1 mg/dL (ref 8.9–10.3)
Chloride: 105 mmol/L (ref 98–111)
Creatinine, Ser: 0.93 mg/dL (ref 0.61–1.24)
GFR, Estimated: >60 mL/min (ref >60)
Glucose, Bld: 107 mg/dL — ABNORMAL HIGH (ref 70–99)
Potassium: 4.1 mmol/L (ref 3.5–5.1)
Sodium: 138 mmol/L (ref 135–145)
Total Bilirubin: 0.6 mg/dL (ref 0.3–1.2)
Total Protein: 6.7 g/dL (ref 6.5–8.1)

## 2020-06-01 LAB — CBC
HCT: 26.9 % — ABNORMAL LOW (ref 39.0–52.0)
Hemoglobin: 8.2 g/dL — ABNORMAL LOW (ref 13.0–17.0)
MCH: 29.5 pg (ref 26.0–34.0)
MCHC: 30.5 g/dL (ref 30.0–36.0)
MCV: 96.8 fL (ref 80.0–100.0)
Platelets: 244 10*3/uL (ref 150–400)
RBC: 2.78 MIL/uL — ABNORMAL LOW (ref 4.22–5.81)
RDW: 19.1 % — ABNORMAL HIGH (ref 11.5–15.5)
WBC: 6.8 10*3/uL (ref 4.0–10.5)
nRBC: 0 % (ref 0.0–0.2)

## 2020-06-01 LAB — LACTATE DEHYDROGENASE: LDH: 213 U/L — ABNORMAL HIGH (ref 98–192)

## 2020-06-01 NOTE — Progress Notes (Signed)
LVAD INR 

## 2020-06-01 NOTE — Progress Notes (Signed)
VAD CLINIC NOTE  HPI:  [name] is a 35 y/o male from [place]  w/ h/o severe systolic HF due to NICM EF 15% and prior polysubstance abuse (cocaine and ETOH)  Underwent HM-3 LVAD placement on 02/26/19 by Dr. Nils Pyle. Extubated 1/25. Required milrinone post op for RV support. Milrinone eventually weaned and Co-ox remained stable of inotropes at 61%. Later placed on sildenafil for continued RV support. Post op recovery complicated by hypoxic respiratory failure, moderate sized rt pneumothorax requiring CT placement.  Admitted 6/28-08/06/19 for AKI and hypotension in setting of volume depletion due to GI bug.Had multiple flow alarms. Echo EF 40-45%   Admitted 12/21 for anemia in setting of supratherapeutic INR (8.0) and bleeding from a sore on his lip. Transfused but did not bleed any further. Developed refractory low flow alarms on VAD with drop in platelets and rising LDH. RUQ U/S was normal (doubt splenic sequestration). Peripheral smear showed no evidence of hemolysis.Patient placed on his left side and low flow alarms resolved. It was suspected possible obstruction/clot of outflow graft causing Plts and Hgb to drop. CT 12/10 showed no evidence of outflow graft kink/clot.  LDH trended back down to his baseline, below 200s. Plts started to trend back up.  Admitted 3/25-4/21/22 for MSSA DL infection requiring surgical debridement. D/c'd to [facility] for IV abx.   Here for post-hospital f/u. Patient reports the nurse is changing dressing every other day and she told him "it is closing up". Feels good. No f/c. Denies orthopnea or PND. No bleeding, melena or neuro symptoms. No VAD alarms. Taking all meds as prescribed. Appetite ok       VAD Indication: Destination Therapy LVAD due to   LVAD assessment: HM III: Speed:  5400 rpms                                 Flow: 4.0                                    Power: 3.9w                                           PI: 3.5                                                     Alarms: one Low Flow  Events: > 80 PI events  Fixed speed: 5400 Low speed limit: 5100  Primary Controller: Replace back up battery in17 months Back up controller: Replace back up battery in 12 months    Past Medical History:  Diagnosis Date  . Dilated cardiomyopathy ([place])   . Dilated cardiomyopathy ([place]) 02/2019  . Polysubstance abuse ([place])     Current Outpatient Medications  Medication Sig Dispense Refill  . carvedilol (COREG) 3.125 MG tablet Take 3 tablets (9.375 mg total) by mouth 2 (two) times daily with a meal.    . ceFAZolin (ANCEF) IVPB Inject 2 g into the vein every 8 (eight) hours for 25 days. Indication: MSSA driveline infection  First Dose:  Yes Last Day of Therapy:  06/13/20 Labs - Once weekly:  CBC/D and BMP, Labs - Every other week:  ESR and CRP Method of administration: IV Push Method of administration may be changed at the discretion of home infusion pharmacist based upon assessment of the patient and/or caregiver's ability to self-administer the medication ordered. 25 Units 0  . losartan (COZAAR) 25 MG tablet Take 1 tablet (25 mg total) by mouth daily.    . magnesium oxide (MAG-OX) 400 (241.3 Mg) MG tablet Take 1 tablet (400 mg total) by mouth daily.    . pantoprazole (PROTONIX) 40 MG tablet Take 1 tablet (40 mg total) by mouth daily. 30 tablet 11  . potassium chloride SA (KLOR-CON M20) 20 MEQ tablet Take 2 tablets (40 mEq total) by mouth daily. 60 tablet 4  . sildenafil (REVATIO) 20 MG tablet Take 1 tablet (20 mg total) by mouth 3 (three) times daily. 90 tablet 5  . warfarin (COUMADIN) 5 MG tablet Take 10 mg Tues/Thurs and 7.5 mg all other days or as directed by HF Clinic 30 tablet 11   No current facility-administered medications for this encounter.    Patient has no known allergies.   Vitals:   06/01/20 1112 06/01/20 1113  BP: (!) 84/0 100/81  Pulse:  90  Temp:  98.4 F (36.9 C)  SpO2:  100%  Weight:  62.1  kg (137 lb)  Height:  5' 8" (1.727 m)     Vital Signs:  Temp: 98.4 Doppler Pressure:84 Automatc BP: 100/81 (89) HR: 90 SPO2: 100%  Weight: 137 lb w/o eqt Hospital d/c weight: 129 lb    Physical Exam: General:  NAD.  HEENT: normal  Neck: supple. JVP not elevated.  Carotids 2+ bilat; no bruits. No lymphadenopathy or thryomegaly appreciated. Cor: LVAD hum.  Lungs: Clear. Abdomen: soft, nontender, non-distended. No hepatosplenomegaly. No bruits or masses. Good bowel sounds. Wound site ok  Driveline site clean. Anchor in place.  Extremities: no cyanosis, clubbing, rash. Warm no edema  Neuro: alert & oriented x 3. No focal deficits. Moves all 4 without problem    ASSESSMENT AND PLAN:   1. MSSA DL infection - Remains on IV abx - site healing well - continue dressing cahnges  2. Chronic systolic HF due to severe NICM - Echo EF 10% with biventricular failure - HM-3 VAD implant 02/26/19 - Echo 7/21 EF 40-45% - Stable NYHA I. Volume status looks good.  - Continue VAD speed at 5400 - Continue sildenafil - MAPs ok. Continue current regimen   3. VAD - Admit 12/21 with multiple low flow alarms. Rising LDH and decreasing PLTs. Suspected outflow graft kink occlusion. Patient placed on his left side and resolved.  - VAD interrogated personally. Parameters stable. - DL site infection as above - INR 2.4 Discussed dosing with PharmD personally. - LDH 213  Continue ASA on top of warfarin for now as LDH recently up > 300 - Hgb 8.2  4. HTN - Blood pressure well controlled. Continue current regimen.  4. Thrombocytosis - resolved  Total time spent 35 minutes. Over half that time spent discussing above.    Glori Bickers, MD  3:45 PM

## 2020-06-01 NOTE — Patient Instructions (Signed)
Patient Instructions: 1. No medication changes today 2. Coumadin dosing per Leotis Shames PharmD 3. Return to clinic in one week for dressing change and wound check.

## 2020-06-01 NOTE — Progress Notes (Addendum)
Patient presents for hosp follow up  in VAD Clinic today alone. Reports no problems with VAD equipment or concerns with drive line.  Patient reports he has been feeling good, denies complaints. He is currently residing at [facility] until IV antibiotic therapy is complete for his abdominal abscess. He is confined to his room for 14 days as part of Covid precautions but says he has everything he needs.   Patient reports the nurse is changing abscess dressing every other day and she told him "it is closing up". See note below. Nursing home will not perform drive line dressing changes, patient has appointment next week for dressing change and wound check. Information sent with patient back to [facility].   Reviewed med sheet from [facility], patient is taking warfarin 7.5 mg daily; copy of sheet given to Karle Plumber, PharmD for INR results today with warfarin dosing.    Vital Signs:  Temp: 98.4 Doppler Pressure: 84 Automatc BP: 100/81 (89) HR: 90 SPO2: 100%  Weight: 137 lb w/o eqt Hospital d/c weight: 129 lb   VAD Indication: Destination Therapy LVAD due to   LVAD assessment: HM III: Speed:  5400 rpms                                 Flow: 4.0      Power: 3.9w                                              PI: 3.5                                                    Alarms: one Low Flow  Events: > 80 PI events  Fixed speed: 5400 Low speed limit: 5100  Primary Controller: Replace back up battery in 17 months Back up controller: Replace back up battery in 12 months  I reviewed the LVAD parameters from today and compared the results to the patient's prior recorded data. LVAD interrogation was NEGATIVE for significant power changes, NEGATIVE for clinical alarms and STABLE for PI events/speed drops. No programming changes were made and pump is functioning within specified parameters. Pt is performing daily controller and system monitor self tests along with completing weekly  and monthly maintenance for LVAD equipment.  LVAD equipment check completed and is in good working order. Back-up equipment present.   Annual Equipment Maintenance on UBC/PM was performed 2/22.  Exit Site Care: Existing VAD dressing removed and site care performed using sterile technique. Drive line exit site cleaned with Chlora prep applicators x 2, rinsed with sterile saline, allowed to dry, and Biopatch dressing with 2 x 2 gauze re-applied. Exit site well healed and incorporated.The velour is fully implanted at exit site. No drainage,  redness, tenderness, or foul odor noted. Drive line anchor re-applied.. Pt denies fever or chills. Continue weekly dressing changes. Provided patient with 3 weekly dressing kits and 5 extra anchors.   Abdominal Abcsess: Gauze dressing intact. Dressing removed, wound is approx 1" long and closing. Site cleaned with betadine swabs x 2. Site with moderate amount yellow, purulent drainage, does not track, no odor or tenderness noted.  Will re-culture next week per Dr. Gala Romney if drainage present. Packed with silver strip and covered with 4 x 4 gauze folded. Pt will continue every other day dressing changes at [facility].    Device: N/A  BP & Labs:  Doppler BP 84 - Doppler is reflecting MAP  Hgb 8.2 - No S/S of bleeding. Specifically denies melena/BRBPR or nosebleeds.  LDH 213 with established baseline of 250 - 430. Denies tea-colored urine. No power elevations noted on interrogation.     Patient Instructions: 1. No medication changes today 2. Coumadin dosing per Leotis Shames PharmD 3. Return to clinic in one week for dressing change and wound check.    [name] RN VAD Coordinator  Office: [phone]  24/7 Pager: [phone]

## 2020-06-06 ENCOUNTER — Other Ambulatory Visit (HOSPITAL_COMMUNITY): Payer: Self-pay | Admitting: *Deleted

## 2020-06-06 ENCOUNTER — Ambulatory Visit (HOSPITAL_COMMUNITY): Payer: Self-pay | Admitting: Pharmacist

## 2020-06-06 DIAGNOSIS — Z95811 Presence of heart assist device: Secondary | ICD-10-CM

## 2020-06-06 DIAGNOSIS — Z7901 Long term (current) use of anticoagulants: Secondary | ICD-10-CM

## 2020-06-06 LAB — POCT INR: INR: 3.5 — AB (ref 2.0–3.0)

## 2020-06-06 NOTE — Progress Notes (Signed)
LVAD INR 

## 2020-06-08 ENCOUNTER — Ambulatory Visit (HOSPITAL_COMMUNITY)
Admission: RE | Admit: 2020-06-08 | Discharge: 2020-06-08 | Disposition: A | Discharge status: Home / Self Care | Payer: Medicaid - Out of State | Source: Ambulatory Visit | Attending: Cardiology | Admitting: Cardiology

## 2020-06-08 ENCOUNTER — Other Ambulatory Visit: Payer: Self-pay

## 2020-06-08 ENCOUNTER — Ambulatory Visit (HOSPITAL_COMMUNITY): Payer: Self-pay | Admitting: Pharmacist

## 2020-06-08 DIAGNOSIS — Z95811 Presence of heart assist device: Secondary | ICD-10-CM

## 2020-06-08 DIAGNOSIS — Z7901 Long term (current) use of anticoagulants: Secondary | ICD-10-CM

## 2020-06-08 LAB — PROTIME-INR
INR: 3 — ABNORMAL HIGH (ref 0.8–1.2)
Prothrombin Time: 30.9 seconds — ABNORMAL HIGH (ref 11.4–15.2)

## 2020-06-08 NOTE — Progress Notes (Signed)
Patient presents for nurse visit for wound care in VAD Clinic today alone. Reports no problems with VAD equipment or concerns with drive line.  Patient reports he has been feeling good, denies complaints. He is currently residing at [facility] until IV antibiotic therapy is complete for his abdominal abscess.   Information sent with patient back to [facility].   INR today is 3.0 - Karle Plumber, PharmD called [facility] with Coumadin dose changes. We will see pt next Friday in clinic.    Exit Site Care: Existing VAD dressing removed and site care performed using sterile technique. Drive line exit site cleaned with Chlora prep applicators x 2, rinsed with sterile saline, allowed to dry, and Biopatch dressing with 2 x 2 gauze re-applied. Exit site well healed and incorporated.The velour is fully implanted at exit site. No drainage,  redness, tenderness, or foul odor noted. Drive line anchor re-applied.. Pt denies fever or chills. Continue weekly dressing changes. Provided patient with 7 extra anchors.     Abdominal Abcsess: Gauze dressing intact. Dressing removed, wound is approx 1" long and closing. Site cleaned with betadine swabs x 2. Site with small amount yellow/brown drainage, does not track, no odor or tenderness noted. Proud flesh cauterized with silver nitrate and covered with 2 x 2 gauze folded. Pt will continue every other day dressing changes at [facility].       Patient Instructions: 1. Return to VAD Clinic next Friday. 2. Patient has appointment with Infectious Disease next Friday as well - see after visit summary for details. 3. Continue wound dressing changes to every other day. 4. PharmD will contact you with INR results from today and warfarin dosing. 5. If any questions, please call VAD Clinic at [phone].  24/7 VAD Pager for any emergencies: [phone]  Carlton Adam RN VAD Coordinator  Office: [phone]  24/7 Pager: [phone]

## 2020-06-08 NOTE — Patient Instructions (Signed)
1. Return to VAD Clinic next Friday. 2. Patient has appointment with Infectious Disease next Friday as well - see after visit summary for details. 3. Advance wound dressing changes to every other day. 4. PharmD will contact you with INR results from today and warfarin dosing. 5. If any questions, please call VAD Clinic aty [phone].  24/7 VAD Pager for any emergencies: [phone]

## 2020-06-08 NOTE — Progress Notes (Signed)
LVAD INR 

## 2020-06-12 ENCOUNTER — Ambulatory Visit (HOSPITAL_COMMUNITY): Payer: Self-pay

## 2020-06-12 LAB — POCT INR: INR: 4.7 — AB (ref 2.0–3.0)

## 2020-06-12 NOTE — Progress Notes (Signed)
LVAD INR 

## 2020-06-13 ENCOUNTER — Inpatient Hospital Stay: Payer: 59 | Admitting: Infectious Diseases

## 2020-06-16 ENCOUNTER — Other Ambulatory Visit: Payer: Self-pay

## 2020-06-16 ENCOUNTER — Encounter: Payer: Self-pay | Admitting: Infectious Diseases

## 2020-06-16 ENCOUNTER — Ambulatory Visit (HOSPITAL_COMMUNITY): Payer: Self-pay

## 2020-06-16 ENCOUNTER — Ambulatory Visit (HOSPITAL_COMMUNITY)
Admit: 2020-06-16 | Discharge: 2020-06-16 | Disposition: A | Discharge status: Home / Self Care | Payer: Medicaid - Out of State | Source: Ambulatory Visit | Attending: Cardiology | Admitting: Cardiology

## 2020-06-16 ENCOUNTER — Ambulatory Visit (INDEPENDENT_AMBULATORY_CARE_PROVIDER_SITE_OTHER): Payer: 59 | Admitting: Infectious Diseases

## 2020-06-16 VITALS — Wt 140.0 lb

## 2020-06-16 DIAGNOSIS — Z4801 Encounter for change or removal of surgical wound dressing: Secondary | ICD-10-CM | POA: Insufficient documentation

## 2020-06-16 DIAGNOSIS — A4901 Methicillin susceptible Staphylococcus aureus infection, unspecified site: Secondary | ICD-10-CM | POA: Insufficient documentation

## 2020-06-16 DIAGNOSIS — T827XXA Infection and inflammatory reaction due to other cardiac and vascular devices, implants and grafts, initial encounter: Secondary | ICD-10-CM | POA: Insufficient documentation

## 2020-06-16 LAB — POCT INR: INR: 1.9 — AB (ref 2.0–3.0)

## 2020-06-16 MED ORDER — WARFARIN SODIUM 5 MG PO TABS: ORAL_TABLET | ORAL | 11 refills | Status: DC

## 2020-06-16 NOTE — Assessment & Plan Note (Addendum)
[name] seems to be doing well on IV cefazolin following surgery and healing nicely.  His primary exit site looks great with incorporated skin and no tunneling from what I can see in photos in the chart (this was only a superficial infection originally) and the medial abdominal abscess that was surgically drained has healed in. Still some hypergranulation tissue present in the wound bed with scant drainage He has an appointment with LVAD team today for dressing care / recommendations.  Will have PICC line removed and continue with some protracted oral therapy with twice daily doxycycline (simplicity of twice a day option preferred) for an additional 1 month until reassessment. He may benefit from suppression chronically with cephalexin. Will see if VAD team can check inflammatory markers and CBC.  Return in about 6 weeks (around 07/28/2020).

## 2020-06-16 NOTE — Progress Notes (Signed)
LVAD INR 

## 2020-06-16 NOTE — Progress Notes (Signed)
Patient: Jon Martin  DOB: 1985/05/06 MRN: [id number] PCP: Illene Silver, MD    Subjective:  Jon Martin is a 35 y.o. male with NICM s/p HM3 LVAD placement.  Developed what appeared to be a superficial driveline infection d/t MSSA.  He was dosed with oritavancin in the hospital and was planning a discharge to home when he had a secondary abscess pop up medial to the exit site along the tunneled power cord in the abdomen.  This did require surgical debridement on April 12 with Dr. Cyndia Bent and found to be communicating with an underlying segment of the power cord.  Intraoperative cultures grew out MSSA again with the same susceptibility pattern.  PICC line was placed and he was sent to a SNF for care and management of IV antibiotics and wound care.   [name] says he is feeling well and no complaints today. No abdominal pain or tenderness. He has been doing weekly dressing kits to the exit site and still has a aquacel strip packed to a shallow open area that has not fully healed yet where his previous surgery is. No fevers or chills. No problems with PICC line aside from it being annoying. Tolerating IV antibiotics well without any side effects. He is going to the LVAD clinic after this appointment for a wound check.   PICC line is without pain, drainage or erythema and is well maintained by [facility] Team. No swelling or altered sensation in affected distal extremity.    Review of Systems  Constitutional: Negative for chills, fever, malaise/fatigue and weight loss.  HENT: Negative for sore throat.   Respiratory: Negative for cough, sputum production and shortness of breath.   Cardiovascular: Negative.   Gastrointestinal: Negative for abdominal pain, diarrhea and vomiting.  Musculoskeletal: Negative for joint pain, myalgias and neck pain.  Skin: Negative for rash.  Neurological: Negative for headaches.  Psychiatric/Behavioral: Negative for depression and substance abuse. The  patient is not nervous/anxious.     Past Medical History:  Diagnosis Date  . Dilated cardiomyopathy ([place])   . Dilated cardiomyopathy ([place]) 02/2019  . Polysubstance abuse [facility])     Outpatient Medications Prior to Visit  Medication Sig Dispense Refill  . carvedilol (COREG) 3.125 MG tablet Take 3 tablets (9.375 mg total) by mouth 2 (two) times daily with a meal.    . ceFAZolin (ANCEF) IVPB Inject 2 g into the vein every 8 (eight) hours for 25 days. Indication: MSSA driveline infection  First Dose: Yes Last Day of Therapy:  06/13/20 Labs - Once weekly:  CBC/D and BMP, Labs - Every other week:  ESR and CRP Method of administration: IV Push Method of administration may be changed at the discretion of home infusion pharmacist based upon assessment of the patient and/or caregiver's ability to self-administer the medication ordered. 25 Units 0  . losartan (COZAAR) 25 MG tablet Take 1 tablet (25 mg total) by mouth daily.    . magnesium oxide (MAG-OX) 400 (241.3 Mg) MG tablet Take 1 tablet (400 mg total) by mouth daily.    . pantoprazole (PROTONIX) 40 MG tablet Take 1 tablet (40 mg total) by mouth daily. 30 tablet 11  . potassium chloride SA (KLOR-CON M20) 20 MEQ tablet Take 2 tablets (40 mEq total) by mouth daily. 60 tablet 4  . sildenafil (REVATIO) 20 MG tablet Take 1 tablet (20 mg total) by mouth 3 (three) times daily. 90 tablet 5  . warfarin (COUMADIN) 5 MG tablet Take 10 mg Tues/Thurs  and 7.5 mg all other days or as directed by HF Clinic 30 tablet 11   No facility-administered medications prior to visit.     No Known Allergies  Social History   Tobacco Use  . Smoking status: Former Smoker    Types: Cigarettes  . Smokeless tobacco: Never Used  Vaping Use  . Vaping Use: Some days  Substance Use Topics  . Alcohol use: Yes  . Drug use: Not Currently    Comment: Hx of cocaine use    Family History  Problem Relation Age of Onset  . CAD Father     Objective:   Vitals:    06/16/20 0913  Weight: 140 lb (63.5 kg)   Body mass index is 21.29 kg/m.  Physical Exam Vitals reviewed.  HENT:     Mouth/Throat:     Mouth: No oral lesions.     Dentition: Normal dentition. No dental caries.  Eyes:     General: No scleral icterus. Cardiovascular:     Rate and Rhythm: Normal rate.     Heart sounds: Normal heart sounds.     Comments: LVAD humm+ Pulmonary:     Effort: Pulmonary effort is normal.     Breath sounds: Normal breath sounds.  Abdominal:     General: There is no distension.     Palpations: Abdomen is soft.     Tenderness: There is no abdominal tenderness.     Comments: Left abdominal exit site dressed in sorbaview. No drainage, tenderness or erythema/induration.   Medial surgical incision is well healed with mild thin odor free drainage in the setting of hypergranulation tissue. No erythema, pain or induration.   Lymphadenopathy:     Cervical: No cervical adenopathy.  Skin:    General: Skin is warm and dry.     Findings: No rash.  Neurological:     Mental Status: He is alert and oriented to person, place, and time.     Lab Results: Lab Results  Component Value Date   WBC 6.8 06/01/2020   HGB 8.2 (L) 06/01/2020   HCT 26.9 (L) 06/01/2020   MCV 96.8 06/01/2020   PLT 244 06/01/2020    Lab Results  Component Value Date   CREATININE 0.93 06/01/2020   BUN 6 06/01/2020   NA 138 06/01/2020   K 4.1 06/01/2020   CL 105 06/01/2020   CO2 26 06/01/2020    Lab Results  Component Value Date   ALT 15 06/01/2020   AST 26 06/01/2020   ALKPHOS 52 06/01/2020   BILITOT 0.6 06/01/2020     Assessment & Plan:   Problem List Items Addressed This Visit      Unprioritized   MSSA (methicillin susceptible Staphylococcus aureus) infection - Primary   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  His primary exit site looks great  with incorporated skin and no tunneling from what I can see in photos in the chart (this was only a superficial infection originally) and the medial abdominal abscess that was surgically drained has healed in. Still some hypergranulation tissue present in the wound bed with scant drainage He has an appointment with LVAD team today for dressing care / recommendations.  Will have PICC line removed and continue with some protracted oral therapy with twice daily doxycycline (simplicity of twice a day option preferred) for an additional 1 month until reassessment. He may  benefit from suppression chronically with cephalexin. Will see if VAD team can check inflammatory markers and CBC.  Return in about 6 weeks (around 07/28/2020).           Janene Madeira, MSN, NP-C [facility] [place] Pager: [phone] Office: [phone]  06/16/20  6:49 PM

## 2020-06-16 NOTE — Patient Instructions (Signed)
1. Advance abdominal wound dressing changes to 3x week. Use Mepitel dressing with aquacell silver strip. 2. May be discharged home. 3. Close f/u in VAD clinic for wound care. Return next Thursday as scheduled.

## 2020-06-16 NOTE — Patient Instructions (Signed)
We will have the nursing home D/C the PICC line and stop the IV antibiotics.   I want you to continue on an antibiotic pill called Doxycycline twice a day with food and a full glass of water.  Will plan to have you continue the pills for 1 month and then return either here or with the LVAD team at the end of June to see how you are doing since stopping the antibiotics   Nice to see you are healing well.    If your VAD team is drawing blood work for you today please ask them to draw a CBC, CRP and ESR for me (just show them this paper they will know what to do)

## 2020-06-16 NOTE — Progress Notes (Addendum)
Patient presents for nurse visit for wound care in VAD Clinic today alone. Reports no problems with VAD equipment or concerns with drive line.  Patient reports he has been feeling good, denies complaints. He is currently residing at [facility] until IV antibiotic therapy is complete for his abdominal abscess.   Patient just completed visit with Rexene Alberts, NP, ID. Plan to stop IV antibiotic, pt will be on PO Doxy for one month with instructions to SNF to pull PICC line today. Patient is asking if he can leave SNF and go home. Dr. Gala Romney updated. I contacted Lemar Lofty. Patient has planned discharge scheduled next Wednesday. We will see patient here weekly for close f/u of wound; pt vebalized agreement to same.   INR today is 1.9 per [facility]; PharmD called [facility] with Coumadin dose changes.    Exit Site Care: Existing VAD dressing removed and site care performed using sterile technique. Drive line exit site cleaned with Chlora prep applicators x 2, rinsed with sterile saline, allowed to dry, and Sorbaview dressing with Biopatch re-applied (patient had changed his own dressing last night).  Exit site well healed and incorporated.The velour is fully implanted at exit site. No drainage,  redness, tenderness, or foul odor noted. Drive line anchor re-applied.. Pt denies fever or chills. Continue weekly dressing changes. Provided patient with 4 weekly dressing kits for home use.    Abdominal Abcsess: Gauze dressing intact. Dressing removed, wound is approx 1" long and closing. Site cleaned with betadine swabs x 2. Site with small amount tan drainage, does not track, no odor or tenderness noted. Site covered with aquacel silver strip and  Mepitel dressing. Box of Mepitel dressings and silver strips given to patient for home use. May advance to three times weekly dressing change. Written instructions included for [facility].     Patient Instructions: 1. Advance  abdominal wound dressing changes to 3x week. Use Mepitel dressing with aquacell silver strip. 2. May be discharged home. 3. Close f/u in VAD clinic for wound care. Return next Thursday as scheduled.   24/7 VAD Pager for any emergencies: [phone]  [name] RN VAD Coordinator  Office: [phone]  24/7 Pager: [phone]

## 2020-06-20 LAB — POCT INR: INR: 5.8 — AB (ref 2.0–3.0)

## 2020-06-21 ENCOUNTER — Telehealth (HOSPITAL_COMMUNITY): Payer: Self-pay | Admitting: *Deleted

## 2020-06-21 ENCOUNTER — Ambulatory Visit (HOSPITAL_COMMUNITY): Payer: Self-pay

## 2020-06-21 NOTE — Progress Notes (Signed)
LVAD INR 

## 2020-06-21 NOTE — Telephone Encounter (Signed)
Called patient to discuss follow up after his discharge from [facility] today.  Pt has wound care scheduled tomorrow here in VAD clinic. He re-scheduled to next Thursday based on transportation issues. He says nurse performed care this am and said wound looked "good". She demonstrated to him how to change and gave him supplies for home use. He is performing weekly drive line dressing changes on himself and says he has adequate supplies at home.  Sharen Hones, PharmD spoke with him on phone re: INR results from [facility] this am along with warfarin dosing instructions.   Asked patient to call VAD Clinic if any questions or issues arise before next week clinic appt. Pt verbalized agreement to same.   [name] RN, VAD Coordinator [phone]

## 2020-06-22 ENCOUNTER — Encounter (HOSPITAL_COMMUNITY): Payer: 59

## 2020-06-23 ENCOUNTER — Other Ambulatory Visit (HOSPITAL_COMMUNITY): Payer: Self-pay | Admitting: *Deleted

## 2020-06-23 DIAGNOSIS — Z7901 Long term (current) use of anticoagulants: Secondary | ICD-10-CM

## 2020-06-23 DIAGNOSIS — Z95811 Presence of heart assist device: Secondary | ICD-10-CM

## 2020-06-29 ENCOUNTER — Telehealth (HOSPITAL_COMMUNITY): Payer: Self-pay | Admitting: Unknown Physician Specialty

## 2020-06-29 ENCOUNTER — Other Ambulatory Visit (HOSPITAL_COMMUNITY): Payer: 59

## 2020-06-29 NOTE — Telephone Encounter (Signed)
Spoke with pt about missing his appt today. Pt states that he was ready but his ride never came. I reached out to the Medicaid transport at [phone]. The representative there informs me that they called the pt yesterday to let him know that they did not have a driver and could give him financial compensation for a ride. I went ahead and scheduled the pt a ride for 6/1 @ 1130 trip # [id number]. I also scheduled his 6/30 appt trip # [phone]. The representative informed me that we can call the day before the pt is scheduled to ensure that he will have transport. She states that if they are having problems getting a driver we need to tell them to escaluate his case and they can get him an uber.  I called the pt and informed him of his scheduled rides and also instructed him that he MUST go to [facility] for an INR tomorrow since his last INR was 5.8. pt states that he will go tomorrow.  Carlton Adam RN, BSN VAD Coordinator 24/7 Pager [phone]

## 2020-07-05 ENCOUNTER — Ambulatory Visit (HOSPITAL_COMMUNITY)
Admission: RE | Admit: 2020-07-05 | Discharge: 2020-07-05 | Disposition: A | Discharge status: Home / Self Care | Payer: Medicaid - Out of State | Source: Ambulatory Visit | Attending: Cardiology | Admitting: Cardiology

## 2020-07-05 ENCOUNTER — Ambulatory Visit (HOSPITAL_COMMUNITY): Payer: Self-pay

## 2020-07-05 ENCOUNTER — Other Ambulatory Visit: Payer: Self-pay

## 2020-07-05 DIAGNOSIS — Z95811 Presence of heart assist device: Secondary | ICD-10-CM | POA: Insufficient documentation

## 2020-07-05 DIAGNOSIS — Z7901 Long term (current) use of anticoagulants: Secondary | ICD-10-CM | POA: Insufficient documentation

## 2020-07-05 LAB — PROTIME-INR
INR: 1.9 — ABNORMAL HIGH (ref 0.8–1.2)
Prothrombin Time: 21.9 seconds — ABNORMAL HIGH (ref 11.4–15.2)

## 2020-07-05 NOTE — Progress Notes (Signed)
LVAD INR 

## 2020-07-05 NOTE — Patient Instructions (Signed)
1. No medication changes today 2. Coumadin dosing per Aundra Millet PharmD 3. Return to clinic in 1 week for INR & dressing change

## 2020-07-05 NOTE — Progress Notes (Signed)
Patient presents for nurse visit for wound care in VAD Clinic today alone. Reports no problems with VAD equipment or concerns with drive line.  Patient reports he has been feeling good, denies complaints. He discharged home from [facility] last Wednesday.   Per visit with Rexene Alberts, NP, ID pt will be on PO Doxy for one month with instructions to come here weekly for close f/u of wound.   Exit Site Care: Existing VAD dressing removed and site care performed using sterile technique. Drive line exit site cleaned with Chlora prep applicators x 2, rinsed with sterile saline, allowed to dry, and Sorbaview dressing with Biopatch re-applied. Exit site well healed and incorporated.The velour is fully implanted at exit site. No drainage,  redness, tenderness, or foul odor noted. Drive line anchor re-applied.. Pt denies fever or chills. Continue weekly dressing changes. Provided patient with 4 weekly dressing kits and 5 anchors for home use.       Abdominal Abcsess: Gauze dressing intact. Dressing removed, wound is approx 1" long and closed. Site cleaned with betadine swabs x 2. Site does not track, no drainage, odor or tenderness noted. Small amount of hypergranulation tissue noted; silver nitrate applied to area. Site covered with Mepitel dressing. Box of Mepitel dressings given to patient for home use. Instructed to continue three times weekly dressing changes.     Patient Instructions: 1. Continue abdominal wound dressing changes to 3x week. Use Mepitel dressing with aquacel silver strip. 2. Close f/u in VAD clinic for wound care. Return next Wednesday as scheduled.   Alyce Pagan RN VAD Coordinator  Office: [phone]  24/7 Pager: [phone]

## 2020-07-07 ENCOUNTER — Other Ambulatory Visit (HOSPITAL_COMMUNITY): Payer: Self-pay | Admitting: Unknown Physician Specialty

## 2020-07-07 DIAGNOSIS — Z95811 Presence of heart assist device: Secondary | ICD-10-CM

## 2020-07-07 DIAGNOSIS — Z7901 Long term (current) use of anticoagulants: Secondary | ICD-10-CM

## 2020-07-12 ENCOUNTER — Telehealth (HOSPITAL_COMMUNITY): Payer: Self-pay | Admitting: *Deleted

## 2020-07-12 ENCOUNTER — Other Ambulatory Visit (HOSPITAL_COMMUNITY): Payer: 59

## 2020-07-12 NOTE — Telephone Encounter (Signed)
Pt called and left message stating his ride never came to pick him up. I reached out to the Medicaid transport at [phone]. The representative there informs me that the transportation service was not aware that their driver had "no showed" and did not pick up patient. I re-scheduled pt a ride for 07/14/20 to be picked up at 0857 (trip # [phone]) for his 10:30 appt.   I called the pt and informed him of his new scheduled ride and appointment date/time. He verbalized understanding.  Alyce Pagan RN VAD Coordinator  Office: [phone]  24/7 Pager: [phone]

## 2020-07-14 ENCOUNTER — Ambulatory Visit (HOSPITAL_COMMUNITY)
Admission: RE | Admit: 2020-07-14 | Discharge: 2020-07-14 | Disposition: A | Discharge status: Home / Self Care | Payer: 59 | Source: Ambulatory Visit | Attending: Internal Medicine | Admitting: Internal Medicine

## 2020-07-14 ENCOUNTER — Other Ambulatory Visit: Payer: Self-pay

## 2020-07-14 ENCOUNTER — Ambulatory Visit (HOSPITAL_COMMUNITY): Payer: Self-pay

## 2020-07-14 DIAGNOSIS — Z95811 Presence of heart assist device: Secondary | ICD-10-CM | POA: Insufficient documentation

## 2020-07-14 DIAGNOSIS — Z7901 Long term (current) use of anticoagulants: Secondary | ICD-10-CM | POA: Insufficient documentation

## 2020-07-14 DIAGNOSIS — Z4801 Encounter for change or removal of surgical wound dressing: Secondary | ICD-10-CM | POA: Diagnosis present

## 2020-07-14 LAB — PROTIME-INR
INR: 1.4 — ABNORMAL HIGH (ref 0.8–1.2)
Prothrombin Time: 16.7 seconds — ABNORMAL HIGH (ref 11.4–15.2)

## 2020-07-14 MED ORDER — ENOXAPARIN SODIUM 40 MG/0.4ML IJ SOSY: 40.0000 mg | PREFILLED_SYRINGE | Freq: Two times a day (BID) | INTRAMUSCULAR | 0 refills | Status: DC

## 2020-07-14 NOTE — Progress Notes (Signed)
LVAD INR 

## 2020-07-14 NOTE — Patient Instructions (Signed)
No medication changes today Coumadin dosing per Aundra Millet PharmD Return to clinic in 1 week for INR & dressing change You may change your incision dressing every 2 days. Call the clinic if there is redness, tenderness, drainage, or foul odor. Your site looked good today. Keep up the great work!

## 2020-07-14 NOTE — Progress Notes (Signed)
CSW met with patient in the clinic. Patient reports he is doing well and feeling much improved. Patient shared that he will come to clinic weekly for close monitoring and denies any other concerns at this time. CSW continues to follow as needed. [name], [place], [place]

## 2020-07-14 NOTE — Progress Notes (Signed)
Patient presents for nurse visit for wound care in VAD Clinic today alone. Reports no problems with VAD equipment or concerns with drive line.  Patient reports he has been feeling good, denies complaints. Reports he changed drive line dressing once since his appointment last week because the dressing was falling off. Dressing today looks excellent.   Per visit with Rexene Alberts, NP, ID pt will be on PO Doxy for one month with instructions to come here weekly for close f/u of wound. Has 4 days remaining.   Exit Site Care: Existing VAD dressing removed and site care performed using sterile technique. Drive line exit site cleaned with Chlora prep applicators x 2, rinsed with sterile saline, allowed to dry, and Sorbaview dressing with Biopatch re-applied. Exit site well healed and incorporated.The velour is fully implanted at exit site. No drainage, redness, tenderness, or foul odor noted. Small rash under previous anchor site. Drive line anchor repositioned and re-applied.. Pt denies fever or chills. Continue weekly dressing changes in VAD clinic. Patient has adequate dressing supplies at home.     Abdominal Abcsess: Mepitel dressing intact. Dressing removed, wound is approx 1" long and closed. Site cleaned with CHG swabs x 2. Site does not track, no drainage, odor or tenderness noted. Small amount of hypergranulation tissue noted; silver nitrate applied to area. Site covered with Mepitel dressing. Pt provided with 2 extra Mepitel dressings today as he has half a box remaining at home. Instructed to advance dressing change to every 2 days.     Patient Instructions: 1. Continue abdominal wound dressing changes every 2 days using Mepitel dressing. 2. Close f/u in VAD clinic for wound care. Return next Friday for wound care.   Alyce Pagan RN VAD Coordinator  Office: [phone]  24/7 Pager: [phone]

## 2020-07-21 ENCOUNTER — Ambulatory Visit (HOSPITAL_COMMUNITY): Payer: Self-pay | Admitting: Pharmacist

## 2020-07-21 ENCOUNTER — Ambulatory Visit (HOSPITAL_COMMUNITY)
Admission: RE | Admit: 2020-07-21 | Discharge: 2020-07-21 | Disposition: A | Discharge status: Home / Self Care | Payer: Medicaid - Out of State | Source: Ambulatory Visit | Attending: Internal Medicine | Admitting: Internal Medicine

## 2020-07-21 ENCOUNTER — Other Ambulatory Visit: Payer: Self-pay

## 2020-07-21 ENCOUNTER — Other Ambulatory Visit (HOSPITAL_COMMUNITY): Payer: Self-pay | Admitting: *Deleted

## 2020-07-21 DIAGNOSIS — Z95811 Presence of heart assist device: Secondary | ICD-10-CM

## 2020-07-21 DIAGNOSIS — Z79811 Long term (current) use of aromatase inhibitors: Secondary | ICD-10-CM | POA: Diagnosis not present

## 2020-07-21 DIAGNOSIS — Z7901 Long term (current) use of anticoagulants: Secondary | ICD-10-CM | POA: Diagnosis not present

## 2020-07-21 DIAGNOSIS — Z4801 Encounter for change or removal of surgical wound dressing: Secondary | ICD-10-CM | POA: Diagnosis present

## 2020-07-21 LAB — PROTIME-INR
INR: 2.6 — ABNORMAL HIGH (ref 0.8–1.2)
Prothrombin Time: 27.6 seconds — ABNORMAL HIGH (ref 11.4–15.2)

## 2020-07-21 NOTE — Patient Instructions (Signed)
No medication changes today Coumadin dosing per [facility] PharmD Change incision dressing twice a week. Next dressing change due Monday Return to VAD clinic in 1 week for INR & dressing change

## 2020-07-21 NOTE — Progress Notes (Addendum)
Patient presents for nurse visit for wound care in VAD Clinic today alone. Reports no problems with VAD equipment or concerns with drive line.  Patient reports he has been feeling good, denies complaints. Reports he changed drive line dressing once since his appointment last week because the dressing was falling off. Dressing today looks excellent.   Pt reports he completed Doxycycline course.   Exit Site Care: Existing VAD dressing removed and site care performed using sterile technique. Drive line exit site cleaned with Chlora prep applicators x 2, rinsed with sterile saline, allowed to dry, and Sorbaview dressing with Biopatch re-applied. Exit site well healed and incorporated.The velour is fully implanted at exit site. No drainage, redness, tenderness, or foul odor noted. Drive line anchor repositioned and re-applied.. Pt denies fever or chills. Continue weekly dressing changes in VAD clinic. Patient has adequate dressing supplies at home.     Abdominal Abcsess: Mepitel dressing intact. Dressing removed, wound is approx 1/2" long and closed. Site cleaned with CHG swabs x 2. Site does not track, no drainage, odor or tenderness noted. Small amount of hypergranulation tissue noted; silver nitrate applied to area. Site covered with Mepitel dressing. Pt provided with a box of Mepitel dressings for home use. Instructed to advance dressing change to twice a week. Pt verbalized understanding, and will change again on Monday at home.     Patient Instructions: 1. Advance abdominal wound dressing changes to twice weekly using Mepitel dressing. 2. Close f/u in VAD clinic for wound care. Return next Friday for wound care.   Alyce Pagan RN VAD Coordinator  Office: [phone]  24/7 Pager: [phone]

## 2020-07-28 ENCOUNTER — Ambulatory Visit (HOSPITAL_COMMUNITY): Payer: Self-pay

## 2020-07-28 ENCOUNTER — Other Ambulatory Visit: Payer: Self-pay

## 2020-07-28 ENCOUNTER — Ambulatory Visit (HOSPITAL_COMMUNITY)
Admission: RE | Admit: 2020-07-28 | Discharge: 2020-07-28 | Disposition: A | Discharge status: Home / Self Care | Payer: Medicaid - Out of State | Source: Ambulatory Visit | Attending: Internal Medicine | Admitting: Internal Medicine

## 2020-07-28 DIAGNOSIS — Z7901 Long term (current) use of anticoagulants: Secondary | ICD-10-CM | POA: Diagnosis present

## 2020-07-28 DIAGNOSIS — Z95811 Presence of heart assist device: Secondary | ICD-10-CM | POA: Diagnosis present

## 2020-07-28 LAB — PROTIME-INR
INR: 1.7 — ABNORMAL HIGH (ref 0.8–1.2)
Prothrombin Time: 19.8 seconds — ABNORMAL HIGH (ref 11.4–15.2)

## 2020-07-28 NOTE — Progress Notes (Signed)
LVAD INR 

## 2020-07-28 NOTE — Progress Notes (Addendum)
Patient presents for nurse visit for wound care in [facility] today alone. Reports no problems with VAD equipment or concerns with drive line.  Patient reports he has been feeling good, denies complaints. Complaining of intermittent burning pain under anchor site. Rash noted. Offered to change dressing to daily kit kit using silk tape, but patient states that daily kits "just don't stay on me" and would prefer to continue using Sorbaview dressing. Will try alternative anchor today to see if this works for him.  Exit Site Care: Existing VAD dressing removed and site care performed using sterile technique. Drive line exit site cleaned with Chlora prep applicators x 2, rinsed with sterile saline, allowed to dry, and Sorbaview dressing with Biopatch re-applied. Exit site well healed and incorporated.The velour is fully implanted at exit site. No drainage, redness, tenderness, or foul odor noted. Rash (dried darkened skin) noted under previous site anchor site. Alternative drive line anchor repositioned and re-applied. Pt denies fever or chills. Continue weekly dressing changes in VAD clinic. Patient provided with 2 additional anchors and 1 dressing kit for home use.     Abdominal Abcsess: Mepitel dressing intact. Dressing removed, wound is approx 1/2" long and completely approximated closed. Site cleaned with CHG swabs x 2. Site does not track, no drainage, odor or tenderness noted. Small amount of hypergranulation tissue noted; silver nitrate applied to area. Site covered with Mepitel dressing. Pt has adequate dressing supplies for home use. Continue dressing change to twice a week for cleanliness. Pt verbalized understanding, and will change again on Monday at home.     Patient Instructions: 1. Continue abdominal wound dressing changes to twice weekly using Mepitel dressing. Will possibly be able to discontinue dressing at next visit.  2. Close f/u in VAD clinic for wound care. Return next Thursday for  full visit with Dr Haroldine Laws.   Emerson Monte RN [place] Coordinator  Office: [phone]  24/7 Pager: [phone]

## 2020-07-28 NOTE — Addendum Note (Signed)
Encounter addended by: Bernita Raisin, RN on: 07/28/2020 12:02 PM  Actions taken: Clinical Note Signed

## 2020-08-01 ENCOUNTER — Ambulatory Visit: Payer: 59 | Admitting: Infectious Diseases

## 2020-08-01 ENCOUNTER — Other Ambulatory Visit (HOSPITAL_COMMUNITY): Payer: Self-pay | Admitting: *Deleted

## 2020-08-01 DIAGNOSIS — Z7901 Long term (current) use of anticoagulants: Secondary | ICD-10-CM

## 2020-08-01 DIAGNOSIS — Z95811 Presence of heart assist device: Secondary | ICD-10-CM

## 2020-08-01 DIAGNOSIS — I5022 Chronic systolic (congestive) heart failure: Secondary | ICD-10-CM

## 2020-08-02 ENCOUNTER — Other Ambulatory Visit (HOSPITAL_COMMUNITY): Payer: Self-pay | Admitting: *Deleted

## 2020-08-02 DIAGNOSIS — T827XXA Infection and inflammatory reaction due to other cardiac and vascular devices, implants and grafts, initial encounter: Secondary | ICD-10-CM

## 2020-08-02 DIAGNOSIS — Z95811 Presence of heart assist device: Secondary | ICD-10-CM

## 2020-08-03 ENCOUNTER — Other Ambulatory Visit: Payer: Self-pay

## 2020-08-03 ENCOUNTER — Encounter (HOSPITAL_COMMUNITY): Payer: Self-pay

## 2020-08-03 ENCOUNTER — Other Ambulatory Visit (HOSPITAL_COMMUNITY): Payer: Self-pay | Admitting: *Deleted

## 2020-08-03 ENCOUNTER — Ambulatory Visit (HOSPITAL_COMMUNITY): Payer: Self-pay

## 2020-08-03 ENCOUNTER — Ambulatory Visit (HOSPITAL_COMMUNITY)
Admission: RE | Admit: 2020-08-03 | Discharge: 2020-08-03 | Disposition: A | Discharge status: Home / Self Care | Payer: Medicaid - Out of State | Source: Ambulatory Visit | Attending: Internal Medicine | Admitting: Internal Medicine

## 2020-08-03 VITALS — BP 95/77 | HR 82 | Temp 97.8°F | Wt 133.8 lb

## 2020-08-03 DIAGNOSIS — Z95811 Presence of heart assist device: Secondary | ICD-10-CM | POA: Insufficient documentation

## 2020-08-03 DIAGNOSIS — D691 Qualitative platelet defects: Secondary | ICD-10-CM

## 2020-08-03 DIAGNOSIS — Z7901 Long term (current) use of anticoagulants: Secondary | ICD-10-CM | POA: Insufficient documentation

## 2020-08-03 DIAGNOSIS — T829XXD Unspecified complication of cardiac and vascular prosthetic device, implant and graft, subsequent encounter: Secondary | ICD-10-CM

## 2020-08-03 DIAGNOSIS — T827XXA Infection and inflammatory reaction due to other cardiac and vascular devices, implants and grafts, initial encounter: Secondary | ICD-10-CM | POA: Diagnosis not present

## 2020-08-03 DIAGNOSIS — I10 Essential (primary) hypertension: Secondary | ICD-10-CM

## 2020-08-03 DIAGNOSIS — X58XXXA Exposure to other specified factors, initial encounter: Secondary | ICD-10-CM | POA: Insufficient documentation

## 2020-08-03 DIAGNOSIS — I5022 Chronic systolic (congestive) heart failure: Secondary | ICD-10-CM | POA: Insufficient documentation

## 2020-08-03 LAB — BASIC METABOLIC PANEL
Anion gap: 9 (ref 5–15)
BUN: <5 mg/dL — ABNORMAL LOW (ref 6–20)
CO2: 24 mmol/L (ref 22–32)
Calcium: 8.6 mg/dL — ABNORMAL LOW (ref 8.9–10.3)
Chloride: 105 mmol/L (ref 98–111)
Creatinine, Ser: 0.84 mg/dL (ref 0.61–1.24)
GFR, Estimated: >60 mL/min (ref >60)
Glucose, Bld: 102 mg/dL — ABNORMAL HIGH (ref 70–99)
Potassium: 3.5 mmol/L (ref 3.5–5.1)
Sodium: 138 mmol/L (ref 135–145)

## 2020-08-03 LAB — SEDIMENTATION RATE: Sed Rate: 7 mm/hr (ref 0–16)

## 2020-08-03 LAB — CBC
HCT: 30.7 % — ABNORMAL LOW (ref 39.0–52.0)
Hemoglobin: 9.4 g/dL — ABNORMAL LOW (ref 13.0–17.0)
MCH: 26.8 pg (ref 26.0–34.0)
MCHC: 30.6 g/dL (ref 30.0–36.0)
MCV: 87.5 fL (ref 80.0–100.0)
Platelets: 132 10*3/uL — ABNORMAL LOW (ref 150–400)
RBC: 3.51 MIL/uL — ABNORMAL LOW (ref 4.22–5.81)
RDW: 18.3 % — ABNORMAL HIGH (ref 11.5–15.5)
WBC: 4.6 10*3/uL (ref 4.0–10.5)
nRBC: 0 % (ref 0.0–0.2)

## 2020-08-03 LAB — C-REACTIVE PROTEIN: CRP: 0.6 mg/dL (ref <1.0)

## 2020-08-03 LAB — PROTIME-INR
INR: 1.9 — ABNORMAL HIGH (ref 0.8–1.2)
Prothrombin Time: 21.7 seconds — ABNORMAL HIGH (ref 11.4–15.2)

## 2020-08-03 LAB — LACTATE DEHYDROGENASE: LDH: 161 U/L (ref 98–192)

## 2020-08-03 MED ORDER — WARFARIN SODIUM 5 MG PO TABS: ORAL_TABLET | ORAL | 11 refills | Status: DC

## 2020-08-03 MED ORDER — POTASSIUM CHLORIDE CRYS ER 20 MEQ PO TBCR: 40.0000 meq | EXTENDED_RELEASE_TABLET | Freq: Every day | ORAL | 5 refills | Status: DC

## 2020-08-03 MED ORDER — DOXYCYCLINE HYCLATE 100 MG PO CAPS
100.0000 mg | ORAL_CAPSULE | Freq: Two times a day (BID) | ORAL | 0 refills | Status: AC
Start: 2020-08-03 — End: 2020-08-17

## 2020-08-03 NOTE — Patient Instructions (Signed)
Start Doxycycline (antibiotic) 100 mg twice a day for the next 14 days per Rexene Alberts NP Coumadin dosing per Aundra Millet PharmD Change abdominal incision dressing daily- cleanse with betadine, and cover with dry gauze and small piece of tape.  Return to VAD clinic in 1 week for drive line dressing change. Please bring your medications to this visit for VAD coordinator to review Return to VAD clinic in 2 months for follow up appointment with Dr Gala Romney

## 2020-08-03 NOTE — Progress Notes (Signed)
LVAD INR 

## 2020-08-03 NOTE — Progress Notes (Addendum)
Patient presents for 2 month f/u  in [facility] today alone. Reports no problems with VAD equipment or concerns with drive line.  Patient reports he has been feeling good. Denies lightheadedness, dizziness, falls, and signs of bleeding. Reports excellent PO intake.   Reports he has not taken his medication yet this morning. He forgot to bring his medication to his appt today for VAD coordinator to review, and he is unsure of what medication he takes at what time. Instructed to bring all his medications to his dressing change appt next week. He verbalized understanding.   Drive line and abdominal incision dressing changes completed today. See documentation below. Will continue weekly drive line dressing changes. Small area of hypergranulation tissue treated with silver nitrate. Spoke with Janene Madeira NP with ID re: scant sticky drainage from well approximated abdominal incision- will start Doxycycline 100 mg BID x 14 days. Prescription sent in to patient's pharmacy. Instructed patient to pick up medication this afternoon and begin taking twice daily until the bottle it empty. He verbalized understanding. Will have patient perform daily dressing change of abdominal incision, cleansing with betadine and covering with dry 2 x 2 secured with tape. Demonstrated and verbalized dressing change. Pt verbalized understanding. Dr Haroldine Laws aware and in agreement with the above.   Sed rate and CRP obtained today per ID request. Labs pending.   Vital Signs:  Temp: 97.8 Doppler Pressure: 96 Automatc BP: 95/77 (84) HR: 82 SR SPO2: UTO %   Weight: 133.8 lb w/o eqt Last clinic weight: 137 lb w/eqt   VAD Indication: Destination Therapy LVAD due to    LVAD assessment: HM III: Speed:  5400 rpms                                 Flow: 4.3      Power: 3.9w                                              PI: 3.6                                                    Alarms: none Events: > 100 PI events  Fixed  speed: 5400 Low speed limit: 5100  Primary Controller: Replace back up battery in 15 months Back up controller:   Replace back up battery in 12 months   I reviewed the LVAD parameters from today and compared the results to the patient's prior recorded data. LVAD interrogation was NEGATIVE for significant power changes, NEGATIVE for clinical alarms and POSITIVE for PI events/speed drops. No programming changes were made and pump is functioning within specified parameters. Pt is performing daily controller and system monitor self tests along with completing weekly and monthly maintenance for LVAD equipment.   LVAD equipment check completed and is in good working order. Back-up equipment present.   Annual Equipment Maintenance on UBC/PM was performed 2/22.  Exit Site Care: Dry skin rash noted under/around drive line dressing. Offered to change dressing to daily kit kit using silk tape, but patient states that daily kits "just don't stay on me" and would prefer to continue using Sorbaview dressing. Existing  VAD dressing removed and site care performed using sterile technique. Drive line exit site cleaned with Chlora prep applicators x 2, rinsed with sterile saline, allowed to dry, and Biopatch dressing with 2 x 2 gauze re-applied. Exit site well healed and incorporated.The velour is fully implanted at exit site. No drainage, redness, tenderness, or foul odor noted. Trialed alternative drive line anchor device last week- pt reports "it did not stay on for more than a few hours." Requesting "regular" anchor today. Drive line anchor re-applied.. Pt denies fever or chills. Continue weekly dressing changes. Provided patient with 4 weekly dressing kits for home use if needed.    Abdominal Abcsess: Mepitel dressing intact. Dressing removed, wound is approx 1" long, well healed and approximated. Small area of hypergranulation tissue noted in healed wound bed. Unable to express any drainage from hypergranulation  tissue. No tunneling noted.  Site cleansed with betadine swabs x 2. Site with scant sticky drainage noted. No odor or tenderness noted. Discussed with Janene Madeira NP with ID. Will restart Doxycycline as documented above. Instructed patient to change dressing daily- cleanse with betadine swab, and cover with 2 x 2 and tape. He verbalized understanding. Provided pt with tape, 2 x 2s and betadine swabs.    Device: N/A  BP & Labs:  Doppler BP 96  - Doppler is reflecting modified systolic   Hgb 9.4- No S/S of bleeding. Specifically denies melena/BRBPR or nosebleeds.   LDH 161 with established baseline of 250 - 430. Denies tea-colored urine. No power elevations noted on interrogation.     Patient Instructions: Start Doxycycline (antibiotic) 100 mg twice a day for the next 14 days per Janene Madeira NP Coumadin dosing per [place] abdominal incision dressing daily- cleanse with betadine, and cover with dry gauze and small piece of tape.  Return to [facility] in 1 week for drive line dressing change. Please bring your medications to this visit for VAD coordinator to review Return to [facility] in 2 months for follow up appointment with Dr Haroldine Laws  Emerson Monte RN [place] Coordinator  Office: [phone]  24/7 Pager: [phone]

## 2020-08-09 NOTE — Progress Notes (Signed)
VAD CLINIC NOTE  HPI:  [name] is a 35 y/o male from [place]  w/ h/o severe systolic HF due to NICM EF 15% and prior polysubstance abuse (cocaine and ETOH)   Underwent HM-3 LVAD placement on 02/26/19 by Dr. Nils Pyle. Extubated 1/25. Required milrinone post op for RV support. Milrinone eventually weaned and Co-ox remained stable of inotropes at 61%. Later placed on sildenafil for continued RV support. Post op recovery complicated by hypoxic respiratory failure, moderate sized rt pneumothorax requiring CT placement.  Admitted 6/28-08/06/19 for AKI and hypotension in setting of volume depletion due to GI bug.Had multiple flow alarms. Echo EF 40-45%   Admitted 12/21 for anemia in setting of supratherapeutic INR (8.0) and bleeding from a sore on his lip. Transfused but did not bleed any further. Developed refractory low flow alarms on VAD with drop in platelets and rising LDH. RUQ U/S was normal (doubt splenic sequestration). Peripheral smear showed no evidence of hemolysis.Patient placed on his left side and low flow alarms resolved. It was suspected possible obstruction/clot of outflow graft causing Plts and Hgb to drop. CT 12/10 showed no evidence of outflow graft kink/clot.  LDH trended back down to his baseline, below 200s. Plts started to trend back up.  Admitted 3/25-4/21/22 for MSSA DL infection requiring surgical debridement. D/c'd to [facility] for IV abx.    Here for routine f/u. Now home from SNF and says he is doing really well. Eating more. Does all activities without any problem. Denies orthopnea or PND. No fevers, chills or problems with driveline. No bleeding, melena or neuro symptoms. No VAD alarms. Taking all meds as prescribed.     VAD Indication: Destination Therapy LVAD due to   LVAD assessment: HM III: Speed:  5400 rpms                                 Flow: 4.3                                    Power: 3.9w                                              PI: 3.6                                                     Alarms: none Events: > 100 PI events  Fixed speed: 5400 Low speed limit: 5100   Primary Controller: Replace back up battery in 15 months Back up controller:   Replace back up battery in 12 months   I reviewed the LVAD parameters from today and compared the results to the patient's prior recorded data. LVAD interrogation was NEGATIVE for significant power changes, NEGATIVE for clinical alarms and POSITIVE for PI events/speed drops. No programming changes were made and pump is functioning within specified parameters. Pt is performing daily controller and system monitor self tests along with completing weekly and monthly maintenance for LVAD equipment.   LVAD equipment check completed and is in good working order.  Back-up equipment present.   Annual Equipment Maintenance on UBC/PM was performed 2/22.        Past Medical History:  Diagnosis Date   Dilated cardiomyopathy ([place])    Dilated cardiomyopathy ([place]) 02/2019   Polysubstance abuse ([facility])     Current Outpatient Medications  Medication Sig Dispense Refill   doxycycline (VIBRAMYCIN) 100 MG capsule Take 1 capsule (100 mg total) by mouth 2 (two) times daily for 14 days. 28 capsule 0   losartan (COZAAR) 25 MG tablet Take 1 tablet (25 mg total) by mouth daily.     magnesium oxide (MAG-OX) 400 (241.3 Mg) MG tablet Take 1 tablet (400 mg total) by mouth daily.     pantoprazole (PROTONIX) 40 MG tablet Take 1 tablet (40 mg total) by mouth daily. 30 tablet 11   sildenafil (REVATIO) 20 MG tablet Take 1 tablet (20 mg total) by mouth 3 (three) times daily. 90 tablet 5   carvedilol (COREG) 3.125 MG tablet Take 3 tablets (9.375 mg total) by mouth 2 (two) times daily with a meal. (Patient taking differently: Take 9.375 mg by mouth 2 (two) times daily with a meal.)     potassium chloride SA (KLOR-CON M20) 20 MEQ tablet Take 2 tablets (40 mEq total) by mouth daily. Take 2 tablets (40 meq) by mouth daily,  or as directed by heart failure clinic 70 tablet 5   warfarin (COUMADIN) 5 MG tablet Take 1 tablet (5 mg) daily or as directed by HF Clinic 30 tablet 11   No current facility-administered medications for this encounter.    Patient has no known allergies.   Vitals:   08/03/20 1112 08/03/20 1113  BP: (!) 96/0 95/77  Pulse: 82   Temp: 97.8 F (36.6 C)   TempSrc: Oral   Weight: 60.7 kg (133 lb 12.8 oz)       Vital Signs: Temp: 97.8 Doppler Pressure: 96 Automatc BP: 95/77 (84) HR: 82 SR SPO2: UTO %   Weight: 133.8 lb w/o eqt Last clinic weight: 137 lb w/eqt    Physical Exam: General:  NAD.  HEENT: normal  Neck: supple. JVP not elevated.  Carotids 2+ bilat; no bruits. No lymphadenopathy or thryomegaly appreciated. Cor: LVAD hum.  Lungs: Clear. Abdomen: obese soft, nontender, non-distended. No hepatosplenomegaly. No bruits or masses. Good bowel sounds. Driveline site clean. Anchor in place. Small area of hypergranulation tissue with scant sticky drainage at ab wound?I&D site Extremities: no cyanosis, clubbing, rash. Warm no edema  Neuro: alert & oriented x 3. No focal deficits. Moves all 4 without problem   ASSESSMENT AND PLAN:   1. MSSA DL infection - Has finished IV abx. Overall doing well. No infectious symptoms,.  - Small area of hypergranulation tissue treated at ab wound site.Treated with silver nitrate. VAD coordinator spoke with Janene Madeira NP - will start Doxycycline 100 mg BID x 14 days. Check ESR and CRP. Call if getting worse.  2. Chronic systolic HF due to severe NICM - Echo EF 10% with biventricular failure - HM-3 VAD implant 02/26/19 - Echo 7/21 EF 40-45% - Doing well NYHA I. Volume status looks good .  - Continue VAD speed at 5400 - Continue sildenafil - MAPS ok   3. VAD - Admit 12/21 with multiple low flow alarms. Rising LDH and decreasing PLTs. Suspected outflow graft kink occlusion. Patient placed on his left side and resolved.  - VAD  interrogated personally. Parameters stable. - DL site issues as above. Start doxy  - INR 1.9  Discussed dosing with PharmD personally. - LDH 161 Continue ASA on top of warfarin for now as LDH recently up > 300 - Hgb 8.2 -> 9.4  4. HTN - Blood pressure well controlled. Continue current regimen.  4. Thrombocytosis - mild. PLTs 132k  - no bleeding  Total time spent 35 minutes. Over half that time spent discussing above.    Glori Bickers, MD  10:59 PM

## 2020-08-10 ENCOUNTER — Ambulatory Visit (HOSPITAL_COMMUNITY): Payer: Self-pay

## 2020-08-10 ENCOUNTER — Ambulatory Visit (HOSPITAL_COMMUNITY)
Admission: RE | Admit: 2020-08-10 | Discharge: 2020-08-10 | Disposition: A | Discharge status: Home / Self Care | Payer: Medicaid - Out of State | Source: Ambulatory Visit | Attending: Cardiology | Admitting: Cardiology

## 2020-08-10 ENCOUNTER — Other Ambulatory Visit: Payer: Self-pay

## 2020-08-10 DIAGNOSIS — Z95811 Presence of heart assist device: Secondary | ICD-10-CM

## 2020-08-10 DIAGNOSIS — Z4801 Encounter for change or removal of surgical wound dressing: Secondary | ICD-10-CM | POA: Insufficient documentation

## 2020-08-10 DIAGNOSIS — Z7901 Long term (current) use of anticoagulants: Secondary | ICD-10-CM

## 2020-08-10 LAB — PROTIME-INR
INR: 3.5 — ABNORMAL HIGH (ref 0.8–1.2)
Prothrombin Time: 34.7 seconds — ABNORMAL HIGH (ref 11.4–15.2)

## 2020-08-10 LAB — CBC
HCT: 30.9 % — ABNORMAL LOW (ref 39.0–52.0)
Hemoglobin: 9.4 g/dL — ABNORMAL LOW (ref 13.0–17.0)
MCH: 26.4 pg (ref 26.0–34.0)
MCHC: 30.4 g/dL (ref 30.0–36.0)
MCV: 86.8 fL (ref 80.0–100.0)
Platelets: 136 10*3/uL — ABNORMAL LOW (ref 150–400)
RBC: 3.56 MIL/uL — ABNORMAL LOW (ref 4.22–5.81)
RDW: 18.7 % — ABNORMAL HIGH (ref 11.5–15.5)
WBC: 4.1 10*3/uL (ref 4.0–10.5)
nRBC: 0 % (ref 0.0–0.2)

## 2020-08-10 NOTE — Progress Notes (Signed)
LVAD INR 

## 2020-08-10 NOTE — Progress Notes (Signed)
Exit Site Care: Dry skin rash noted under/around drive line dressing. No anchor today. Existing VAD dressing removed and site care performed using sterile technique. Drive line exit site cleaned with Chlora prep applicators x 2, rinsed with sterile saline, allowed to dry, and Biopatch dressing with 2 x 2 gauze re-applied. Exit site well healed and incorporated.The velour is fully implanted at exit site. No drainage, redness, tenderness, or foul odor noted. T Drive line anchor re-applied. Pt denies fever or chills. Continue weekly dressing changes. Provided patient with 4 weekly dressing kits for home use if needed.    Abdominal Abcsess: Gauze dressing intact. Dressing removed, wound is approx 1" long, well healed and approximated. Tiny area of hypergranulation tissue noted in healed wound bed. Unable to express any drainage from hypergranulation tissue. No tunneling noted.  Site cleansed with CHG x 2. No odor or tenderness noted. No drainage noted. Pt was supposed to start Doxycycline last week but pt never picked this up from the pharmacy. Pt tells me that he will pick up his antibiotic tonight. Instructed patient to change dressing daily- cleanse with betadine swab, and cover with 2 x 2 and tape. He verbalized understanding.   Return to clinic next week for dressing change and INR.   Carlton Adam RN, BSN VAD Coordinator 24/7 Pager [phone]

## 2020-08-11 ENCOUNTER — Other Ambulatory Visit (HOSPITAL_COMMUNITY): Payer: Self-pay | Admitting: Unknown Physician Specialty

## 2020-08-11 DIAGNOSIS — Z7901 Long term (current) use of anticoagulants: Secondary | ICD-10-CM

## 2020-08-11 DIAGNOSIS — Z95811 Presence of heart assist device: Secondary | ICD-10-CM

## 2020-08-17 ENCOUNTER — Other Ambulatory Visit (HOSPITAL_COMMUNITY): Payer: 59

## 2020-08-17 ENCOUNTER — Telehealth (HOSPITAL_COMMUNITY): Payer: Self-pay | Admitting: Unknown Physician Specialty

## 2020-08-17 NOTE — Telephone Encounter (Signed)
Pt was a no show for his appt today. Called pt at home, pt tells me that he called the medicaid transport on Monday but she said he needed to give them a 3 day notice. I have rescheduled the pt for July 20th at 1030. I have instructed the pt to call medicaid transport as soon as he gets off the phone with me to ensure he gets a spot for his appt next week. He assures me that he will.  Carlton Adam RN, BSN VAD Coordinator 24/7 Pager [phone]

## 2020-08-22 ENCOUNTER — Other Ambulatory Visit (HOSPITAL_COMMUNITY): Payer: Self-pay | Admitting: Internal Medicine

## 2020-08-22 DIAGNOSIS — Z95811 Presence of heart assist device: Secondary | ICD-10-CM

## 2020-08-22 DIAGNOSIS — I5022 Chronic systolic (congestive) heart failure: Secondary | ICD-10-CM

## 2020-08-23 ENCOUNTER — Other Ambulatory Visit: Payer: Self-pay

## 2020-08-23 ENCOUNTER — Ambulatory Visit (HOSPITAL_COMMUNITY): Payer: Self-pay

## 2020-08-23 ENCOUNTER — Ambulatory Visit (HOSPITAL_COMMUNITY)
Admission: RE | Admit: 2020-08-23 | Discharge: 2020-08-23 | Disposition: A | Discharge status: Home / Self Care | Payer: 59 | Source: Ambulatory Visit | Attending: Cardiology | Admitting: Cardiology

## 2020-08-23 DIAGNOSIS — Z5181 Encounter for therapeutic drug level monitoring: Secondary | ICD-10-CM | POA: Insufficient documentation

## 2020-08-23 DIAGNOSIS — Z95811 Presence of heart assist device: Secondary | ICD-10-CM

## 2020-08-23 DIAGNOSIS — Z7901 Long term (current) use of anticoagulants: Secondary | ICD-10-CM | POA: Diagnosis not present

## 2020-08-23 LAB — PROTIME-INR
INR: 2.9 — ABNORMAL HIGH (ref 0.8–1.2)
Prothrombin Time: 30.4 seconds — ABNORMAL HIGH (ref 11.4–15.2)

## 2020-08-23 NOTE — Progress Notes (Signed)
LVAD INR 

## 2020-08-23 NOTE — Progress Notes (Addendum)
Pt presents to clinic today for INR and exit site care. Reports he picked up Doxycycline as instructed, but has been taking it once daily. Instructed to continue taking Doxy until the bottle is empty. He verbalized understanding.   Exit Site Care: Pt performed own dressing change last week. Dressing is pristine today. Dry skin rash noted under/around drive line dressing. No anchor today. Existing VAD dressing removed and site care performed using sterile technique. Drive line exit site cleaned with Chlora prep applicators x 2, rinsed with sterile saline, allowed to dry, and Biopatch dressing with gauze re-applied. Exit site well healed and incorporated.The velour is fully implanted at exit site. No drainage, redness, tenderness, or foul odor noted. Pt has adequate dressing supplies at home.   Abdominal Abcsess: Mepitel dressing intact. Dressing removed, wound is approx 1" long, well healed and approximated. No hypergranulation tissue noted. No tunneling noted.  Site cleansed with CHG x 2. No odor or tenderness noted. No drainage noted. Instructed patient to cleanse site with betadine daily, but may leave open to air. He verbalized understanding.    Plan:  Return to clinic next week for dressing change and INR.  Alyce Pagan RN VAD Coordinator  Office: [phone]  24/7 Pager: [phone]

## 2020-08-25 ENCOUNTER — Other Ambulatory Visit (HOSPITAL_COMMUNITY): Payer: Self-pay | Admitting: Unknown Physician Specialty

## 2020-08-25 DIAGNOSIS — Z7901 Long term (current) use of anticoagulants: Secondary | ICD-10-CM

## 2020-08-25 DIAGNOSIS — Z95811 Presence of heart assist device: Secondary | ICD-10-CM

## 2020-08-30 ENCOUNTER — Ambulatory Visit (HOSPITAL_COMMUNITY)
Admission: RE | Admit: 2020-08-30 | Discharge: 2020-08-30 | Disposition: A | Discharge status: Home / Self Care | Payer: Medicaid - Out of State | Source: Ambulatory Visit | Attending: Cardiology | Admitting: Cardiology

## 2020-08-30 ENCOUNTER — Other Ambulatory Visit: Payer: Self-pay

## 2020-08-30 ENCOUNTER — Ambulatory Visit (HOSPITAL_COMMUNITY): Payer: Self-pay

## 2020-08-30 DIAGNOSIS — Z95811 Presence of heart assist device: Secondary | ICD-10-CM | POA: Diagnosis not present

## 2020-08-30 DIAGNOSIS — Z7901 Long term (current) use of anticoagulants: Secondary | ICD-10-CM | POA: Diagnosis present

## 2020-08-30 LAB — PROTIME-INR
INR: 2.1 — ABNORMAL HIGH (ref 0.8–1.2)
Prothrombin Time: 23.5 seconds — ABNORMAL HIGH (ref 11.4–15.2)

## 2020-08-30 NOTE — Progress Notes (Signed)
Pt presents to clinic today for INR and exit site care. Instructed to continue taking Doxy until the bottle is empty. He verbalized understanding.   Exit Site Care: Dry skin rash noted under/around drive line dressing. Anchor completely pulled away from skin. Existing VAD dressing removed and site care performed using sterile technique. Drive line exit site cleaned with Chlora prep applicators x 2, rinsed with sterile saline, allowed to dry, and Biopatch dressing with gauze re-applied. Exit site well healed and incorporated.The velour is fully implanted at exit site. No drainage, redness, tenderness, or foul odor noted. Pt provided with 4 weekly kits in case dressing falls off at home.    Wound is approx 1" long, well healed and approximated. No hypergranulation tissue noted. No tunneling noted. Site cleansed with CHG x 2. No odor or tenderness noted. No drainage noted. Instructed patient to cleanse site with betadine daily, but may leave open to air. He verbalized understanding.    Plan:  Return to clinic next week for dressing change and INR.  Alyce Pagan RN VAD Coordinator  Office: [phone]  24/7 Pager: [phone]

## 2020-08-30 NOTE — Progress Notes (Signed)
LVAD INR 

## 2020-09-01 ENCOUNTER — Other Ambulatory Visit (HOSPITAL_COMMUNITY): Payer: Self-pay | Admitting: Unknown Physician Specialty

## 2020-09-01 DIAGNOSIS — Z95811 Presence of heart assist device: Secondary | ICD-10-CM

## 2020-09-01 DIAGNOSIS — Z7901 Long term (current) use of anticoagulants: Secondary | ICD-10-CM

## 2020-09-06 ENCOUNTER — Other Ambulatory Visit (HOSPITAL_COMMUNITY): Payer: Self-pay | Admitting: Internal Medicine

## 2020-09-07 ENCOUNTER — Telehealth (HOSPITAL_COMMUNITY): Payer: Self-pay | Admitting: Unknown Physician Specialty

## 2020-09-07 ENCOUNTER — Other Ambulatory Visit (HOSPITAL_COMMUNITY): Payer: 59

## 2020-09-07 NOTE — Telephone Encounter (Signed)
Unable to reach pt about missed appt today. Both numbers are not working, 1 is disconnected and the other has a VM box that is full. I have mailed the pt a no show asking him to reschedule.   Carlton Adam RN, BSN VAD Coordinator 24/7 Pager [phone]

## 2020-09-22 ENCOUNTER — Ambulatory Visit (HOSPITAL_COMMUNITY): Payer: Self-pay | Admitting: Pharmacist

## 2020-09-22 ENCOUNTER — Other Ambulatory Visit: Payer: Self-pay

## 2020-09-22 ENCOUNTER — Ambulatory Visit (HOSPITAL_COMMUNITY)
Admission: RE | Admit: 2020-09-22 | Discharge: 2020-09-22 | Disposition: A | Discharge status: Home / Self Care | Payer: Medicaid - Out of State | Source: Ambulatory Visit | Attending: Internal Medicine | Admitting: Internal Medicine

## 2020-09-22 DIAGNOSIS — Z7901 Long term (current) use of anticoagulants: Secondary | ICD-10-CM | POA: Insufficient documentation

## 2020-09-22 DIAGNOSIS — Z95811 Presence of heart assist device: Secondary | ICD-10-CM | POA: Diagnosis present

## 2020-09-22 LAB — PROTIME-INR
INR: 1.7 — ABNORMAL HIGH (ref 0.8–1.2)
Prothrombin Time: 19.6 seconds — ABNORMAL HIGH (ref 11.4–15.2)

## 2020-09-22 MED ORDER — WARFARIN SODIUM 5 MG PO TABS: ORAL_TABLET | ORAL | 11 refills | Status: DC

## 2020-09-22 NOTE — Progress Notes (Signed)
LVAD INR 

## 2020-09-28 ENCOUNTER — Other Ambulatory Visit (HOSPITAL_COMMUNITY): Payer: Self-pay | Admitting: Unknown Physician Specialty

## 2020-09-28 DIAGNOSIS — Z7901 Long term (current) use of anticoagulants: Secondary | ICD-10-CM

## 2020-09-28 DIAGNOSIS — Z95811 Presence of heart assist device: Secondary | ICD-10-CM

## 2020-09-29 ENCOUNTER — Ambulatory Visit (HOSPITAL_COMMUNITY)
Admission: RE | Admit: 2020-09-29 | Discharge: 2020-09-29 | Disposition: A | Discharge status: Home / Self Care | Payer: Medicaid Other | Source: Ambulatory Visit | Attending: Cardiology | Admitting: Cardiology

## 2020-09-29 ENCOUNTER — Other Ambulatory Visit: Payer: Self-pay

## 2020-09-29 ENCOUNTER — Ambulatory Visit (HOSPITAL_COMMUNITY): Payer: Self-pay | Admitting: Pharmacist

## 2020-09-29 DIAGNOSIS — Z7901 Long term (current) use of anticoagulants: Secondary | ICD-10-CM | POA: Insufficient documentation

## 2020-09-29 DIAGNOSIS — Z95811 Presence of heart assist device: Secondary | ICD-10-CM | POA: Insufficient documentation

## 2020-09-29 LAB — PROTIME-INR
INR: 1.5 — ABNORMAL HIGH (ref 0.8–1.2)
Prothrombin Time: 18.2 seconds — ABNORMAL HIGH (ref 11.4–15.2)

## 2020-09-29 MED ORDER — WARFARIN SODIUM 5 MG PO TABS: ORAL_TABLET | ORAL | 11 refills | Status: DC

## 2020-09-29 NOTE — Addendum Note (Signed)
Addended by: Evon Slack on: 09/29/2020 11:49 AM   Modules accepted: Orders

## 2020-09-29 NOTE — Progress Notes (Signed)
Pt presents to clinic today for INR and exit site care.   Exit Site Care: Dry skin rash noted under/around drive line dressing. Existing VAD dressing removed and site care performed using sterile technique. Drive line exit site cleaned with Chlora prep applicators x 2, rinsed with sterile saline, allowed to dry, and Silverlon dressing with Sorbaview re-applied. Exit site well healed and incorporated.The velour is fully implanted at exit site. No drainage, redness, tenderness, or foul odor noted. Pt provided with 4 weekly kits in case dressing falls off at home.    Plan:  Return to clinic next week for full visit with Dr. Gala Romney. Pt is asking if can come every 2 months again and do his own dressing. Pts dressing was done by himself this past week and dressing was intact and done correctly. We can discussed this with Dr. Gala Romney next week at his visit.   Carlton Adam RN VAD Coordinator  Office: [phone]  24/7 Pager: [phone]

## 2020-09-29 NOTE — Progress Notes (Signed)
CSW spoke with patient in the clinic. Patient shared that he is doing well at home and has a lot of contact with his niece and nephew which he enjoys. He stated that he has been slowly working on getting his mother's clothes out of the house although his siblings slow the process as they all want to go through the clothing before he takes them away. Patient appears to be adjusting to the loss of his mother who passed away 10 months ago. CSW provided supportive intervention and Patient denies any other concerns at this time. Lasandra Beech, LCSW, CCSW-MCS [phone]

## 2020-09-29 NOTE — Addendum Note (Signed)
Encounter addended by: Marcy Siren, LCSW on: 09/29/2020 11:43 AM  Actions taken: Clinical Note Signed

## 2020-09-29 NOTE — Progress Notes (Signed)
LVAD INR 

## 2020-10-04 ENCOUNTER — Other Ambulatory Visit (HOSPITAL_COMMUNITY): Payer: Self-pay | Admitting: *Deleted

## 2020-10-04 DIAGNOSIS — Z95811 Presence of heart assist device: Secondary | ICD-10-CM

## 2020-10-04 DIAGNOSIS — Z7901 Long term (current) use of anticoagulants: Secondary | ICD-10-CM

## 2020-10-05 ENCOUNTER — Encounter (HOSPITAL_COMMUNITY): Payer: 59

## 2020-10-05 ENCOUNTER — Encounter (HOSPITAL_COMMUNITY): Payer: Self-pay | Admitting: *Deleted

## 2020-10-23 ENCOUNTER — Other Ambulatory Visit: Payer: Self-pay

## 2020-10-23 ENCOUNTER — Ambulatory Visit (HOSPITAL_COMMUNITY): Payer: Self-pay | Admitting: Pharmacist

## 2020-10-23 ENCOUNTER — Ambulatory Visit (HOSPITAL_COMMUNITY)
Admission: RE | Admit: 2020-10-23 | Discharge: 2020-10-23 | Disposition: A | Discharge status: Home / Self Care | Payer: Medicaid - Out of State | Source: Ambulatory Visit | Attending: Internal Medicine | Admitting: Internal Medicine

## 2020-10-23 VITALS — BP 101/0 | HR 93 | Ht 68.0 in | Wt 120.4 lb

## 2020-10-23 DIAGNOSIS — D691 Qualitative platelet defects: Secondary | ICD-10-CM

## 2020-10-23 DIAGNOSIS — D75839 Thrombocytosis, unspecified: Secondary | ICD-10-CM | POA: Insufficient documentation

## 2020-10-23 DIAGNOSIS — B9561 Methicillin susceptible Staphylococcus aureus infection as the cause of diseases classified elsewhere: Secondary | ICD-10-CM | POA: Insufficient documentation

## 2020-10-23 DIAGNOSIS — I110 Hypertensive heart disease with heart failure: Secondary | ICD-10-CM | POA: Diagnosis present

## 2020-10-23 DIAGNOSIS — T827XXA Infection and inflammatory reaction due to other cardiac and vascular devices, implants and grafts, initial encounter: Secondary | ICD-10-CM

## 2020-10-23 DIAGNOSIS — Z95811 Presence of heart assist device: Secondary | ICD-10-CM

## 2020-10-23 DIAGNOSIS — Z7901 Long term (current) use of anticoagulants: Secondary | ICD-10-CM | POA: Insufficient documentation

## 2020-10-23 DIAGNOSIS — I10 Essential (primary) hypertension: Secondary | ICD-10-CM

## 2020-10-23 DIAGNOSIS — I5022 Chronic systolic (congestive) heart failure: Secondary | ICD-10-CM | POA: Diagnosis not present

## 2020-10-23 DIAGNOSIS — F1410 Cocaine abuse, uncomplicated: Secondary | ICD-10-CM | POA: Insufficient documentation

## 2020-10-23 DIAGNOSIS — Z79899 Other long term (current) drug therapy: Secondary | ICD-10-CM | POA: Diagnosis not present

## 2020-10-23 DIAGNOSIS — I5082 Biventricular heart failure: Secondary | ICD-10-CM | POA: Insufficient documentation

## 2020-10-23 DIAGNOSIS — I420 Dilated cardiomyopathy: Secondary | ICD-10-CM | POA: Diagnosis not present

## 2020-10-23 LAB — COMPREHENSIVE METABOLIC PANEL
ALT: 25 U/L (ref 0–44)
AST: 43 U/L — ABNORMAL HIGH (ref 15–41)
Albumin: 4.1 g/dL (ref 3.5–5.0)
Alkaline Phosphatase: 54 U/L (ref 38–126)
Anion gap: 14 (ref 5–15)
BUN: 9 mg/dL (ref 6–20)
CO2: 22 mmol/L (ref 22–32)
Calcium: 9.4 mg/dL (ref 8.9–10.3)
Chloride: 102 mmol/L (ref 98–111)
Creatinine, Ser: 0.92 mg/dL (ref 0.61–1.24)
GFR, Estimated: >60 mL/min (ref >60)
Glucose, Bld: 125 mg/dL — ABNORMAL HIGH (ref 70–99)
Potassium: 3.4 mmol/L — ABNORMAL LOW (ref 3.5–5.1)
Sodium: 138 mmol/L (ref 135–145)
Total Bilirubin: 1.7 mg/dL — ABNORMAL HIGH (ref 0.3–1.2)
Total Protein: 7.5 g/dL (ref 6.5–8.1)

## 2020-10-23 LAB — CBC
HCT: 34.7 % — ABNORMAL LOW (ref 39.0–52.0)
Hemoglobin: 10.9 g/dL — ABNORMAL LOW (ref 13.0–17.0)
MCH: 29.2 pg (ref 26.0–34.0)
MCHC: 31.4 g/dL (ref 30.0–36.0)
MCV: 93 fL (ref 80.0–100.0)
Platelets: 233 10*3/uL (ref 150–400)
RBC: 3.73 MIL/uL — ABNORMAL LOW (ref 4.22–5.81)
RDW: 18.8 % — ABNORMAL HIGH (ref 11.5–15.5)
WBC: 5.6 10*3/uL (ref 4.0–10.5)
nRBC: 0 % (ref 0.0–0.2)

## 2020-10-23 LAB — PROTIME-INR
INR: 1.6 — ABNORMAL HIGH (ref 0.8–1.2)
Prothrombin Time: 19.1 seconds — ABNORMAL HIGH (ref 11.4–15.2)

## 2020-10-23 LAB — LACTATE DEHYDROGENASE: LDH: 164 U/L (ref 98–192)

## 2020-10-23 LAB — MAGNESIUM: Magnesium: 1.6 mg/dL — ABNORMAL LOW (ref 1.7–2.4)

## 2020-10-23 LAB — PREALBUMIN: Prealbumin: 25 mg/dL (ref 18–38)

## 2020-10-23 NOTE — Progress Notes (Signed)
VAD CLINIC NOTE  HPI:  [name] is a 35 y/o male from [place]  w/ h/o severe systolic HF due to NICM EF 15% and prior polysubstance abuse (cocaine and ETOH)   Underwent HM-3 LVAD placement on 02/26/19 by Dr. Morton Peters. Extubated 1/25. Required milrinone post op for RV support. Milrinone eventually weaned and Co-ox remained stable of inotropes at 61%. Later placed on sildenafil for continued RV support. Post op recovery complicated by hypoxic respiratory failure, moderate sized rt pneumothorax requiring CT placement.  Admitted 6/28-08/06/19 for AKI and hypotension in setting of volume depletion due to GI bug.Had multiple flow alarms. Echo EF 40-45%   Admitted 12/21 for anemia in setting of supratherapeutic INR (8.0) and bleeding from a sore on his lip. Transfused but did not bleed any further. Developed refractory low flow alarms on VAD with drop in platelets and rising LDH. RUQ U/S was normal (doubt splenic sequestration). Peripheral smear showed no evidence of hemolysis.Patient placed on his left side and low flow alarms resolved. It was suspected possible obstruction/clot of outflow graft causing Plts and Hgb to drop. CT 12/10 showed no evidence of outflow graft kink/clot.  LDH trended back down to his baseline, below 200s. Plts started to trend back up.  Admitted 3/25-4/21/22 for MSSA DL infection requiring surgical debridement. D/c'd to [facility] for IV abx.    Here for routine f/u. Says he is feeling good. Doing all activities without problem. Reports eating well but weight down 13 pounds. Denies orthopnea or PND. No fevers, chills or problems with driveline. No bleeding, melena or neuro symptoms. No VAD alarms. Taking all meds as prescribed.    VAD Indication: Destination Therapy LVAD due to    LVAD assessment: HM III: Speed:  5400 rpms                                 Flow: 3.2                                    Power: 3.9w                                              PI: 9.1                                                    Alarms: none Events: 10 today and 80+ PI events yesterday  Fixed speed: 5400 Low speed limit: 5100   Primary Controller: Replace back up battery in 14 months Back up controller:   Replace back up battery in 11 months   I reviewed the LVAD parameters from today and compared the results to the patient's prior recorded data. LVAD interrogation was NEGATIVE for significant power changes, NEGATIVE for clinical alarms and POSITIVE for PI events/speed drops. No programming changes were made and pump is functioning within specified parameters. Pt is performing daily controller and system monitor self tests along with completing weekly and monthly maintenance for LVAD equipment.   LVAD equipment check completed and is in good working  order. Back-up equipment present.    Annual Equipment Maintenance on UBC/PM was performed 2/22.  Past Medical History:  Diagnosis Date   Dilated cardiomyopathy (HCC)    Dilated cardiomyopathy (HCC) 02/2019   Polysubstance abuse (HCC)     Current Outpatient Medications  Medication Sig Dispense Refill   potassium chloride SA (KLOR-CON M20) 20 MEQ tablet Take 2 tablets (40 mEq total) by mouth daily. Take 2 tablets (40 meq) by mouth daily, or as directed by heart failure clinic 70 tablet 5   sildenafil (REVATIO) 20 MG tablet TAKE 1 TABLET BY MOUTH THREE TIMES DAILY 90 tablet 11   warfarin (COUMADIN) 5 MG tablet Take 2.5 mg (1/2 tab) every Monday and 1 tablet (5 mg) all other days or as directed by HF Clinic 30 tablet 11   carvedilol (COREG) 3.125 MG tablet Take 3 tablets (9.375 mg total) by mouth 2 (two) times daily with a meal. (Patient not taking: Reported on 10/23/2020)     losartan (COZAAR) 25 MG tablet Take 1 tablet (25 mg total) by mouth daily. (Patient not taking: Reported on 10/23/2020)     magnesium oxide (MAG-OX) 400 (241.3 Mg) MG tablet Take 1 tablet (400 mg total) by mouth daily. (Patient not taking: Reported  on 10/23/2020)     pantoprazole (PROTONIX) 40 MG tablet Take 1 tablet (40 mg total) by mouth daily. (Patient not taking: Reported on 10/23/2020) 30 tablet 11   No current facility-administered medications for this encounter.    Patient has no known allergies.   Vitals:   10/23/20 1052 10/23/20 1054  BP: 112/84 (!) 101/0  Pulse: 93   SpO2: 100%   Weight: 54.6 kg (120 lb 6.4 oz)   Height: 5\' 8"  (1.727 m)     Vital Signs:  Temp: 97.8 Doppler Pressure: 98 Automatc BP: 112/84 (95) HR: 93 SR SPO2: 100 %   Weight: 120.4 lb w/o eqt Last clinic weight: 133.8 lb w/o eqt    Physical Exam: General:  NAD.  HEENT: normal  Neck: supple. JVP not elevated.  Carotids 2+ bilat; no bruits. No lymphadenopathy or thryomegaly appreciated. Cor: LVAD hum.  Lungs: Clear. Abdomen: soft, nontender, non-distended. No hepatosplenomegaly. No bruits or masses. Good bowel sounds. Driveline site clean. Anchor in place.  Extremities: no cyanosis, clubbing, rash. Warm no edema  Neuro: alert & oriented x 3. No focal deficits. Moves all 4 without problem   ASSESSMENT AND PLAN:   1. Chronic systolic HF due to severe NICM - Echo EF 10% with biventricular failure - HM-3 VAD implant 02/26/19 - Echo 7/21 EF 40-45% - Stable NYHA I. Volume status looks good.  - Continue VAD speed at 5400 - Continue sildenafil - MAPs ok  3. VAD - Admit 12/21 with multiple low flow alarms. Rising LDH and decreasing PLTs. Suspected outflow graft kink occlusion. Patient placed on his left side and resolved.  - VAD interrogated personally. Parameters stable. - DL site much improved. Reinforced need to avoid DL trauma. - INR 1.6 Discussed dosing with PharmD personally. - LDH 164 Continue ASA on top of warfarin for now as LDH recently up > 300 - Hgb 8.2 -> 9.4 -> 10.9  3. MSSA DL infection - Has finished IV abx. Overall doing well. No infectious symptoms,.  - DL site looks good. Avoid DL trauma  4. Weight loss - I continue to  worry about his food intake since his mother died - Encouraged him to eat more  - Prealbumin reassuring at 25.0  5.  HTN - Blood pressure well controlled. Continue current regimen.  6. Thrombocytosis - resolved PLTs 233k today  Total time spent 35 minutes. Over half that time spent discussing above.    Arvilla Meres, MD  11:09 AM

## 2020-10-23 NOTE — Progress Notes (Signed)
LVAD INR 

## 2020-10-23 NOTE — Progress Notes (Addendum)
Patient presents for 2 month f/u  in [facility] today alone. Reports no problems with VAD equipment or concerns with drive line.  Patient reports he has been feeling good. Denies lightheadedness, dizziness, falls, and signs of bleeding. Reports excellent PO intake although his weight is down 13 lbs today.   Reports he has not taken his medication yet this morning. He forgot to bring his medication to his appt today for VAD coordinator to review, and he is unsure of what medication he takes at what time. Instructed to bring all his medications at his next visit. He verbalized understanding. According to what he told me he is only taking sildenafil, potassium and warfarin.  Drive line change completed today. See documentation below. Will continue weekly drive line dressing changes. Discussed skin excoriation with Darrick Grinder, NP.   Vital Signs:  Temp: 97.8 Doppler Pressure: 98 Automatc BP: 112/84 (95) HR: 93 SR SPO2: 100 %   Weight: 120.4 lb w/o eqt Last clinic weight: 133.8 lb w/o eqt   VAD Indication: Destination Therapy LVAD due to    LVAD assessment: HM III: Speed:  5400 rpms                                 Flow: 3.2      Power: 3.9w                                              PI: 9.1                                                   Alarms: none Events: 10 today and 80+ PI events yesterday  Fixed speed: 5400 Low speed limit: 5100  Primary Controller: Replace back up battery in 14 months Back up controller:   Replace back up battery in 11 months   I reviewed the LVAD parameters from today and compared the results to the patient's prior recorded data. LVAD interrogation was NEGATIVE for significant power changes, NEGATIVE for clinical alarms and POSITIVE for PI events/speed drops. No programming changes were made and pump is functioning within specified parameters. Pt is performing daily controller and system monitor self tests along with completing weekly and monthly maintenance  for LVAD equipment.   LVAD equipment check completed and is in good working order. Back-up equipment present.   Annual Equipment Maintenance on UBC/PM was performed 2/22.  Exit Site Care: Dry skin rash noted under/around drive line dressing. Offered to change dressing to daily kit kit using silk tape, but patient states that daily kits "just don't stay on me" and would prefer to continue using Sorbaview dressing. Existing VAD dressing removed and site care performed using sterile technique. Drive line exit site cleaned with Chlora prep applicators x 1, rinsed with sterile saline, allowed to dry, and Silverlon dressing re-applied. Exit site well healed and incorporated.The velour is fully implanted. Pt denies fever or chills. Continue weekly dressing changes. Provided patient with 8 weekly dressing kits for home use if needed. Darrick Grinder, NP into assess the wound. Pt was instructed to clean the driveline with saline only and to stop using the CHG swabs due to extreme  skin excoriation. Pt also instructed that if he has any drainage at all that he needs to call the VAD clinic immediately. Pt verbalized understanding.  Device: N/A  BP & Labs:  Doppler BP 98  - Doppler is reflecting modified systolic   Hgb 9.4- No S/S of bleeding. Specifically denies melena/BRBPR or nosebleeds.   LDH 161 with established baseline of 250 - 430. Denies tea-colored urine. No power elevations noted on interrogation.    1.5 year Intermacs follow up completed including:  Quality of Life, KCCQ-12, and Neurocognitive trail making.   Pt completed 1540 feet during 6 minute walk.  Back up controller:  11V backup battery charged during this visit.  [place] Cardiomyopathy Questionnaire  KCCQ-12 10/23/2020 03/29/2020 08/26/2019  1 a. Ability to shower/bathe Not at all limited Other, Did not do Not at all limited  1 b. Ability to walk 1 block Not at all limited Extremely limited Not at all limited  1 c. Ability to  hurry/jog Not at all limited Extremely limited Not at all limited  2. Edema feet/ankles/legs Never over the past 2 weeks Never over the past 2 weeks -  3. Limited by fatigue Never over the past 2 weeks Never over the past 2 weeks Never over the past 2 weeks  4. Limited by dyspnea Never over the past 2 weeks Never over the past 2 weeks Never over the past 2 weeks  5. Sitting up / on 3+ pillows Never over the past 2 weeks Never over the past 2 weeks Never over the past 2 weeks  6. Limited enjoyment of life Limited quite a bit Not limited at all Not limited at all  7. Rest of life w/ symptoms Not at all satisfied Not at all satisfied Not at all satisfied  8 a. Participation in hobbies Did not limit at all N/A, did not do for other reasons Did not limit at all  8 b. Participation in chores Did not limit at all N/A, did not do for other reasons Did not limit at all  8 c. Visiting family/friends Did not limit at all N/A, did not do for other reasons Did not limit at all      Patient Instructions: No change in medications Only use saline to clean driveline. Do NOT use CHG swabs. Call [place] [facility] immediately if you develop any drainage from driveline. Coumadin instructions per Lauren. You will need to have your INR checked at [facility] on 11/06/20 - I have faxed new orders to [facility] today. Return to clinic in 2 months. Please bring all your medications to clinic.    Tanda Rockers RN [place] Coordinator  Office: [phone]  24/7 Pager: [phone]

## 2020-10-23 NOTE — Patient Instructions (Signed)
No change in medications Only use saline to clean driveline. Do NOT use CHG swabs. Call VAD clinic immediately if you develop any drainage from driveline. Return to clinic in 2 months. Please bring all your medications to clinic.

## 2020-11-06 ENCOUNTER — Other Ambulatory Visit: Payer: Self-pay | Admitting: Internal Medicine

## 2020-11-07 ENCOUNTER — Ambulatory Visit (HOSPITAL_COMMUNITY): Payer: Self-pay | Admitting: Pharmacist

## 2020-11-07 LAB — PROTIME-INR
INR: 1.1 (ref 0.9–1.2)
Prothrombin Time: 11.4 s (ref 9.1–12.0)

## 2020-11-07 NOTE — Progress Notes (Signed)
LVAD INR 

## 2020-11-17 ENCOUNTER — Telehealth (HOSPITAL_COMMUNITY): Payer: Self-pay | Admitting: Pharmacist

## 2020-11-17 NOTE — Telephone Encounter (Signed)
Patient was due for INR recheck Monday of this week. Have been unable to reach patient after calling multiple times. LVM asking patient to get INR checked locally today or Monday of next week.   Karle Plumber, PharmD, BCPS, BCCP, CPP Heart Failure Clinic Pharmacist [phone]

## 2020-11-20 ENCOUNTER — Other Ambulatory Visit (HOSPITAL_COMMUNITY): Payer: Self-pay | Admitting: Internal Medicine

## 2020-11-21 ENCOUNTER — Other Ambulatory Visit (HOSPITAL_COMMUNITY): Payer: Self-pay | Admitting: *Deleted

## 2020-11-21 MED ORDER — WARFARIN SODIUM 5 MG PO TABS: ORAL_TABLET | ORAL | 11 refills | Status: DC

## 2020-11-27 ENCOUNTER — Other Ambulatory Visit (HOSPITAL_COMMUNITY): Payer: Self-pay | Admitting: Internal Medicine

## 2020-11-28 ENCOUNTER — Telehealth (HOSPITAL_COMMUNITY): Payer: Self-pay | Admitting: Pharmacist

## 2020-11-28 NOTE — Telephone Encounter (Signed)
Have been unable to reach patient to have him get his INR rechecked. Reached out again today and patient stated he had not gone because he was out of his warfarin (out x1 week) and the pharmacy would not refill it. Called his pharmacy and the warfarin prescription has been ready for him in the waiting bin for a week. Called patient back but went to VM. Left VM asking patient to pick up his warfarin and restart therapy.   Karle Plumber, PharmD, BCPS, BCCP, CPP Heart Failure Clinic Pharmacist [phone]

## 2020-12-22 ENCOUNTER — Other Ambulatory Visit (HOSPITAL_COMMUNITY): Payer: Self-pay | Admitting: Unknown Physician Specialty

## 2020-12-22 DIAGNOSIS — Z7901 Long term (current) use of anticoagulants: Secondary | ICD-10-CM

## 2020-12-22 DIAGNOSIS — Z95811 Presence of heart assist device: Secondary | ICD-10-CM

## 2020-12-25 ENCOUNTER — Encounter (HOSPITAL_COMMUNITY): Payer: Self-pay

## 2020-12-25 ENCOUNTER — Ambulatory Visit (HOSPITAL_COMMUNITY)
Admission: RE | Admit: 2020-12-25 | Discharge: 2020-12-25 | Disposition: A | Discharge status: Home / Self Care | Payer: Medicaid - Out of State | Source: Ambulatory Visit | Attending: Cardiology | Admitting: Cardiology

## 2020-12-25 ENCOUNTER — Ambulatory Visit (HOSPITAL_COMMUNITY): Payer: Self-pay | Admitting: Pharmacist

## 2020-12-25 VITALS — BP 93/69 | HR 82 | Temp 98.4°F | Ht 68.0 in | Wt 125.0 lb

## 2020-12-25 DIAGNOSIS — Z95811 Presence of heart assist device: Secondary | ICD-10-CM | POA: Diagnosis not present

## 2020-12-25 DIAGNOSIS — I5082 Biventricular heart failure: Secondary | ICD-10-CM | POA: Diagnosis not present

## 2020-12-25 DIAGNOSIS — Z7901 Long term (current) use of anticoagulants: Secondary | ICD-10-CM | POA: Diagnosis not present

## 2020-12-25 DIAGNOSIS — I5022 Chronic systolic (congestive) heart failure: Secondary | ICD-10-CM | POA: Insufficient documentation

## 2020-12-25 DIAGNOSIS — I110 Hypertensive heart disease with heart failure: Secondary | ICD-10-CM | POA: Insufficient documentation

## 2020-12-25 DIAGNOSIS — I10 Essential (primary) hypertension: Secondary | ICD-10-CM

## 2020-12-25 DIAGNOSIS — T829XXA Unspecified complication of cardiac and vascular prosthetic device, implant and graft, initial encounter: Secondary | ICD-10-CM

## 2020-12-25 DIAGNOSIS — I420 Dilated cardiomyopathy: Secondary | ICD-10-CM | POA: Insufficient documentation

## 2020-12-25 DIAGNOSIS — B9561 Methicillin susceptible Staphylococcus aureus infection as the cause of diseases classified elsewhere: Secondary | ICD-10-CM | POA: Insufficient documentation

## 2020-12-25 LAB — BASIC METABOLIC PANEL
Anion gap: 8 (ref 5–15)
BUN: <5 mg/dL — ABNORMAL LOW (ref 6–20)
CO2: 24 mmol/L (ref 22–32)
Calcium: 8.7 mg/dL — ABNORMAL LOW (ref 8.9–10.3)
Chloride: 103 mmol/L (ref 98–111)
Creatinine, Ser: 0.94 mg/dL (ref 0.61–1.24)
GFR, Estimated: >60 mL/min (ref >60)
Glucose, Bld: 103 mg/dL — ABNORMAL HIGH (ref 70–99)
Potassium: 3.7 mmol/L (ref 3.5–5.1)
Sodium: 135 mmol/L (ref 135–145)

## 2020-12-25 LAB — CBC
HCT: 32 % — ABNORMAL LOW (ref 39.0–52.0)
Hemoglobin: 10 g/dL — ABNORMAL LOW (ref 13.0–17.0)
MCH: 29.6 pg (ref 26.0–34.0)
MCHC: 31.3 g/dL (ref 30.0–36.0)
MCV: 94.7 fL (ref 80.0–100.0)
Platelets: 203 10*3/uL (ref 150–400)
RBC: 3.38 MIL/uL — ABNORMAL LOW (ref 4.22–5.81)
RDW: 20.7 % — ABNORMAL HIGH (ref 11.5–15.5)
WBC: 6.5 10*3/uL (ref 4.0–10.5)
nRBC: 0 % (ref 0.0–0.2)

## 2020-12-25 LAB — PROTIME-INR
INR: 1.4 — ABNORMAL HIGH (ref 0.8–1.2)
Prothrombin Time: 17 seconds — ABNORMAL HIGH (ref 11.4–15.2)

## 2020-12-25 LAB — LACTATE DEHYDROGENASE: LDH: 146 U/L (ref 98–192)

## 2020-12-25 MED ORDER — PANTOPRAZOLE SODIUM 40 MG PO TBEC: 40.0000 mg | DELAYED_RELEASE_TABLET | Freq: Every day | ORAL | 3 refills | Status: DC

## 2020-12-25 NOTE — Progress Notes (Signed)
VAD CLINIC NOTE  HPI:  [name] is a 35 y/o male from [place]  w/ h/o severe systolic HF due to NICM EF 15% and prior polysubstance abuse (cocaine and ETOH)   Underwent HM-3 LVAD placement on 02/26/19 by Dr. Morton Peters. Extubated 1/25. Required milrinone post op for RV support. Milrinone eventually weaned and Co-ox remained stable of inotropes at 61%. Later placed on sildenafil for continued RV support. Post op recovery complicated by hypoxic respiratory failure, moderate sized rt pneumothorax requiring CT placement.  Admitted 6/28-08/06/19 for AKI and hypotension in setting of volume depletion due to GI bug.Had multiple flow alarms. Echo EF 40-45%   Admitted 12/21 for anemia in setting of supratherapeutic INR (8.0) and bleeding from a sore on his lip. Transfused but did not bleed any further. Developed refractory low flow alarms on VAD with drop in platelets and rising LDH. RUQ U/S was normal (doubt splenic sequestration). Peripheral smear showed no evidence of hemolysis.Patient placed on his left side and low flow alarms resolved. It was suspected possible obstruction/clot of outflow graft causing Plts and Hgb to drop. CT 12/10 showed no evidence of outflow graft kink/clot.  LDH trended back down to his baseline, below 200s. Plts started to trend back up.  Admitted 3/25-4/21/22 for MSSA DL infection requiring surgical debridement. D/c'd to [facility] for IV abx.    Here for routine f/u.  Says he feels good. Doing all activities without problem. Recently had baby girl. Now 3 months old. Denies orthopnea or PND. No fevers, chills or problems with driveline. No bleeding, melena or neuro symptoms. No VAD alarms. Taking all meds as prescribed.    VAD Indication: Destination Therapy LVAD due to narcotic use   LVAD assessment: HM III: Speed:  5400 rpms                                 Flow: 3.8                                    Power: 3.9w                                              PI:  5.3                                                   Alarms: none Events: 100+ PI events yesterday  Fixed speed: 5400 Low speed limit: 5100   Primary Controller: Replace back up battery in 10 months Back up controller:   Replace back up battery in 5 months - replaced back up battery with [id number]; exp 02/26/22   I reviewed the LVAD parameters from today and compared the results to the patient's prior recorded data. LVAD interrogation was NEGATIVE for significant power changes, NEGATIVE for clinical alarms and POSITIVE for PI events/speed drops. No programming changes were made and pump is functioning within specified parameters. Pt is performing daily controller and system monitor self tests along with completing weekly and monthly maintenance for LVAD equipment.   LVAD equipment  check completed and is in good working order. Back-up equipment present.    Annual Equipment Maintenance on UBC/PM was performed 2/22.   I reviewed the LVAD parameters from today and compared the results to the patient's prior recorded data. LVAD interrogation was NEGATIVE for significant power changes, NEGATIVE for clinical alarms and POSITIVE for PI events/speed drops. No programming changes were made and pump is functioning within specified parameters. Pt is performing daily controller and system monitor self tests along with completing weekly and monthly maintenance for LVAD equipment.   LVAD equipment check completed and is in good working order. Back-up equipment present.    Annual Equipment Maintenance on UBC/PM was performed 2/22.  Past Medical History:  Diagnosis Date   Dilated cardiomyopathy (HCC)    Dilated cardiomyopathy (HCC) 02/2019   Polysubstance abuse (HCC)     Current Outpatient Medications  Medication Sig Dispense Refill   potassium chloride SA (KLOR-CON) 20 MEQ tablet Take 40 mEq by mouth daily.     sildenafil (REVATIO) 20 MG tablet TAKE 1 TABLET BY MOUTH THREE TIMES DAILY 90 tablet 11    warfarin (COUMADIN) 5 MG tablet TAKE 1/2 tab on Sun, Tues, Thurs.All other days take 1 tablet, or as directed by heart failure clinic 60 tablet 11   carvedilol (COREG) 3.125 MG tablet Take 3 tablets (9.375 mg total) by mouth 2 (two) times daily with a meal. (Patient not taking: Reported on 10/23/2020)     losartan (COZAAR) 25 MG tablet Take 1 tablet (25 mg total) by mouth daily. (Patient not taking: Reported on 12/25/2020)     magnesium oxide (MAG-OX) 400 (241.3 Mg) MG tablet Take 1 tablet (400 mg total) by mouth daily. (Patient not taking: Reported on 10/23/2020)     pantoprazole (PROTONIX) 40 MG tablet Take 1 tablet (40 mg total) by mouth daily. 90 tablet 3   potassium chloride SA (KLOR-CON M20) 20 MEQ tablet Take 2 tablets (40 mEq total) by mouth daily. Take 2 tablets (40 meq) by mouth daily, or as directed by heart failure clinic 70 tablet 5   warfarin (COUMADIN) 5 MG tablet Take 2.5 mg (1/2 tab) every Monday and 1 tablet (5 mg) all other days or as directed by HF Clinic 30 tablet 11   No current facility-administered medications for this encounter.    Patient has no known allergies.  Vital Signs:  Temp: 98.4 Doppler Pressure: 76 Automatc BP: 93/69 (77) HR: 82 SPO2: 100 %   Weight: 125 lb w/o eqt Last clinic weight: 120.4 lb w/o eqt  Physical Exam: General:  NAD.  HEENT: normal  Neck: supple. JVP not elevated.  Carotids 2+ bilat; no bruits. No lymphadenopathy or thryomegaly appreciated. Cor: LVAD hum.  Lungs: Clear. Abdomen: Soft, nontender, non-distended. No hepatosplenomegaly. No bruits or masses. Good bowel sounds. Driveline site with mild superficial drainage Anchor in place.  Extremities: no cyanosis, clubbing, rash. Warm no edema  Neuro: alert & oriented x 3. No focal deficits. Moves all 4 without problem   ASSESSMENT AND PLAN:   1. Chronic systolic HF due to severe NICM - Echo EF 10% with biventricular failure - HM-3 VAD implant 02/26/19 - Echo 7/21 EF 40-45% - Stable  NYHA I. Volume status looks good.  - Continue VAD speed at 5400 - Continue sildenafil - Has been off carvedilol  - MAPs stable  3. VAD - Admit 12/21 with multiple low flow alarms. Rising LDH and decreasing PLTs. Suspected outflow graft kink occlusion. Patient placed on his left side  and resolved.  - VAD interrogated personally. Parameters stable. - DL site with mild drainage. Counseled on dressing changes and close f/u. No need for abx at this point. Will follow - INR 1.4 Discussed dosing with PharmD personally. - LDH 146 Continue ASA on top of warfarin for now as LDH up previously > 300 - Hgb 8.2 -> 9.4 -> 10.9 -> 10. MCV ok  3. MSSA DL infection - Has finished IV abx. Overall doing well. No infectious symptoms,.  - DL site care as above.   4. HTN - Blood pressure well controlled. Continue current regimen.   Total time spent 35 minutes. Over half that time spent discussing above.    Arvilla Meres, MD  11:04 AM

## 2020-12-25 NOTE — Progress Notes (Signed)
Jon Martin presents for 2 month f/u  in [facility] Clinic today alone. Reports no problems with VAD equipment or concerns with drive line.  Jon Martin reports he has been feeling good. Denies lightheadedness, dizziness, falls, or signs of bleeding.   Jon Martin brought home meds for review. He is taking taking sildenafil, potassium and warfarin. He reports he was told "to stop the other ones". He tried to pick up Protonix, but pharmacy "did not have". Electronic refill sent to local pharmacy. Dr. Gala Romney updated, no other changes at this time.   Pt reports he is taking warfarin 2.5 mg daily except 5 mg on Tues/Thurs/Sun (which is the opposite of last instructions per Leotis Shames, PharmD).  Lauren in room and spoke with Jon Martin. Pt waited until INR results back and written as well as oral instructions given re: warfarin dosing and next INR. Pt verbalized understanding of same.   Pt reports his abdominal wound started draining a few days ago; says it has not drained with daily cleaning with betadine, but he couldn't find anything to clean it with. See note below. Provided with 12 weekly dressing kits, 6 extra anchors, box of sterile 2 x 2's, betadine swabs, and tape.   Vital Signs:  Temp: 98.4 Doppler Pressure: 76 Automatc BP: 93/69 (77) HR: 82 SPO2: 100 %   Weight: 125 lb w/o eqt Last clinic weight: 120.4 lb w/o eqt   VAD Indication: Destination Therapy LVAD due to narcotic use   LVAD assessment: HM III: Speed:  5400 rpms                                 Flow: 3.8      Power: 3.9w                                              PI: 5.3                                                   Alarms: none Events: 100+ PI events yesterday  Fixed speed: 5400 Low speed limit: 5100  Primary Controller: Replace back up battery in 10 months Back up controller:   Replace back up battery in 5 months - replaced back up battery with [id number]; exp 02/26/22   I reviewed the LVAD parameters from today and compared the  results to the Jon Martin's prior recorded data. LVAD interrogation was NEGATIVE for significant power changes, NEGATIVE for clinical alarms and POSITIVE for PI events/speed drops. No programming changes were made and pump is functioning within specified parameters. Pt is performing daily controller and system monitor self tests along with completing weekly and monthly maintenance for LVAD equipment.   LVAD equipment check completed and is in good working order. Back-up equipment present.   Annual Equipment Maintenance on UBC/PM was performed 2/22.  Exit Site Care: Existing VAD dressing removed and site care performed using sterile technique. Drive line exit site cleaned with Chlora prep applicator x 1 with immediate burning reported by Jon Martin. Rinsed with sterile saline and then cleaned with betadine swabs x 2, rinsed with sterile saline, allowed to dry, and Silverlon dressing re-applied. Exit site well healed and incorporated.The velour is  fully implanted. Pt denies fever or chills. Continue weekly dressing changes. Provided Jon Martin with home dressing supplies.   Small abdominal wound with yellow drainage, cleaned with betadine and covered with dry dressing. Pt will clean and change dressing daily. If drainage does not clear, pt agrees to call VAD clinic and report continued drainage.   Device: N/A  BP & Labs:  Doppler BP 76 - Doppler is reflecting MAP   Hgb 10.0 - No S/S of bleeding. Specifically denies melena/BRBPR or nosebleeds.   LDH 146 with established baseline of 250 - 430. Denies tea-colored urine. No power elevations noted on interrogation.     Jon Martin Instructions: Do daily dressing changes on abdominal opening. Clean with betadine and keep covered with dry clean dressing. Continue weekly dressing changes on drive line. Be careful to keep drive line untangled. Replace anchor if it comes off. Return to VAD Clinic in 2 months. Bring your home equipment for annual maintenance and be  prepared to perform 6 minute walk.  Re-start Protonix 40 mg daily. Rx sent to your local pharmacy. 5.   Take warfarin 5 mg (whole tablet) daily except take 2.5 (1/2 tab) on Sun/Tues/Thurs. Re-check your INR Monday 01/08/21.    [name] RN VAD Coordinator  Office: [phone]  24/7 Pager: [phone]

## 2020-12-25 NOTE — Progress Notes (Signed)
LVAD INR 

## 2020-12-25 NOTE — Patient Instructions (Addendum)
Do daily dressing changes on abdominal opening. Clean with betadine and keep covered with dry clean dressing. Continue weekly dressing changes on drive line. Be careful to keep drive line untangled. Replace anchor if it comes off. Return to VAD Clinic in 2 months. Bring your home equipment for annual maintenance and be prepared to perform 6 minute walk.  Re-start Protonix 40 mg daily. Rx sent to your local pharmacy. 5.   Take warfarin 5 mg (whole tablet) daily except take 2.5 (1/2 tab) on Sun/Tues/Thurs. Re-check your INR Monday 01/08/21.

## 2021-01-02 ENCOUNTER — Telehealth (HOSPITAL_COMMUNITY): Payer: Self-pay | Admitting: Pharmacist

## 2021-01-02 NOTE — Telephone Encounter (Signed)
Patient was due for INR recheck yesterday. Planned to check locally. No results have been received. Called patient and left reminder VM to get his INR checked today.   Karle Plumber, PharmD, BCPS, BCCP, CPP Heart Failure Clinic Pharmacist [phone]

## 2021-01-08 ENCOUNTER — Telehealth (HOSPITAL_COMMUNITY): Payer: Self-pay | Admitting: Pharmacist

## 2021-01-08 ENCOUNTER — Other Ambulatory Visit: Payer: Self-pay | Admitting: Internal Medicine

## 2021-01-08 NOTE — Telephone Encounter (Signed)
Patient is due for INR recheck. Planned to check locally. No results have been received. Have not been able to get in touch with patient. Have left multiple reminder VM to get his INR checked.  Karle Plumber, PharmD, BCPS, BCCP, CPP Heart Failure Clinic Pharmacist [phone]

## 2021-01-10 ENCOUNTER — Ambulatory Visit (HOSPITAL_COMMUNITY): Payer: Self-pay | Admitting: Pharmacist

## 2021-01-10 LAB — SPECIMEN STATUS REPORT

## 2021-01-10 LAB — PROTIME-INR
INR: 1.7 — ABNORMAL HIGH (ref 0.9–1.2)
Prothrombin Time: 17.2 s — ABNORMAL HIGH (ref 9.1–12.0)

## 2021-01-10 LAB — POCT INR: INR: 1.7 — AB (ref 2.0–3.0)

## 2021-01-10 NOTE — Progress Notes (Signed)
LVAD INR 

## 2021-01-30 ENCOUNTER — Telehealth (HOSPITAL_COMMUNITY): Payer: Self-pay | Admitting: Pharmacist

## 2021-01-30 NOTE — Telephone Encounter (Signed)
Patient is past due for INR Check. LVM and asked him to check this week.   Karle Plumber, PharmD, BCPS, BCCP, CPP Heart Failure Clinic Pharmacist [phone]

## 2021-02-06 ENCOUNTER — Telehealth (HOSPITAL_COMMUNITY): Payer: Self-pay | Admitting: Pharmacist

## 2021-02-06 NOTE — Telephone Encounter (Signed)
Patient is past due for INR Check. Have called >3 times to remind him to get his INR checked. Unable to leave VM as VM box is full.   Karle Plumber, PharmD, BCPS, BCCP, CPP Heart Failure Clinic Pharmacist [phone]

## 2021-02-22 ENCOUNTER — Other Ambulatory Visit (HOSPITAL_COMMUNITY): Payer: Self-pay | Admitting: Unknown Physician Specialty

## 2021-02-22 DIAGNOSIS — Z95811 Presence of heart assist device: Secondary | ICD-10-CM

## 2021-02-22 DIAGNOSIS — Z7901 Long term (current) use of anticoagulants: Secondary | ICD-10-CM

## 2021-02-26 ENCOUNTER — Ambulatory Visit (HOSPITAL_COMMUNITY)
Admission: RE | Admit: 2021-02-26 | Discharge: 2021-02-26 | Disposition: A | Discharge status: Home / Self Care | Payer: Medicaid Other | Source: Ambulatory Visit | Attending: Cardiology | Admitting: Cardiology

## 2021-02-26 ENCOUNTER — Telehealth (HOSPITAL_COMMUNITY): Payer: Self-pay | Admitting: Cardiology

## 2021-02-26 ENCOUNTER — Inpatient Hospital Stay (HOSPITAL_COMMUNITY): Payer: Medicaid Other

## 2021-02-26 ENCOUNTER — Other Ambulatory Visit: Payer: Self-pay

## 2021-02-26 ENCOUNTER — Encounter (HOSPITAL_COMMUNITY): Payer: Self-pay

## 2021-02-26 ENCOUNTER — Encounter (HOSPITAL_COMMUNITY): Payer: Self-pay | Admitting: Internal Medicine

## 2021-02-26 ENCOUNTER — Inpatient Hospital Stay (HOSPITAL_COMMUNITY)
Admission: AD | Admit: 2021-02-26 | Discharge: 2021-03-16 | DRG: 264 | Disposition: A | Discharge status: Home Health | Payer: Medicaid Other | Attending: Internal Medicine | Admitting: Internal Medicine

## 2021-02-26 VITALS — BP 109/82 | HR 87 | Wt 121.2 lb

## 2021-02-26 DIAGNOSIS — Z7901 Long term (current) use of anticoagulants: Secondary | ICD-10-CM | POA: Diagnosis not present

## 2021-02-26 DIAGNOSIS — I10 Essential (primary) hypertension: Secondary | ICD-10-CM | POA: Diagnosis not present

## 2021-02-26 DIAGNOSIS — Y831 Surgical operation with implant of artificial internal device as the cause of abnormal reaction of the patient, or of later complication, without mention of misadventure at the time of the procedure: Secondary | ICD-10-CM | POA: Diagnosis present

## 2021-02-26 DIAGNOSIS — F1410 Cocaine abuse, uncomplicated: Secondary | ICD-10-CM | POA: Diagnosis present

## 2021-02-26 DIAGNOSIS — D509 Iron deficiency anemia, unspecified: Secondary | ICD-10-CM | POA: Diagnosis present

## 2021-02-26 DIAGNOSIS — Z8249 Family history of ischemic heart disease and other diseases of the circulatory system: Secondary | ICD-10-CM

## 2021-02-26 DIAGNOSIS — I5082 Biventricular heart failure: Secondary | ICD-10-CM | POA: Diagnosis present

## 2021-02-26 DIAGNOSIS — L02818 Cutaneous abscess of other sites: Secondary | ICD-10-CM | POA: Diagnosis present

## 2021-02-26 DIAGNOSIS — I420 Dilated cardiomyopathy: Secondary | ICD-10-CM | POA: Diagnosis present

## 2021-02-26 DIAGNOSIS — F1010 Alcohol abuse, uncomplicated: Secondary | ICD-10-CM | POA: Diagnosis present

## 2021-02-26 DIAGNOSIS — I110 Hypertensive heart disease with heart failure: Secondary | ICD-10-CM | POA: Diagnosis present

## 2021-02-26 DIAGNOSIS — Z20822 Contact with and (suspected) exposure to covid-19: Secondary | ICD-10-CM | POA: Diagnosis present

## 2021-02-26 DIAGNOSIS — I959 Hypotension, unspecified: Secondary | ICD-10-CM | POA: Diagnosis present

## 2021-02-26 DIAGNOSIS — E876 Hypokalemia: Secondary | ICD-10-CM | POA: Diagnosis present

## 2021-02-26 DIAGNOSIS — R1110 Vomiting, unspecified: Secondary | ICD-10-CM | POA: Diagnosis not present

## 2021-02-26 DIAGNOSIS — B9561 Methicillin susceptible Staphylococcus aureus infection as the cause of diseases classified elsewhere: Secondary | ICD-10-CM | POA: Diagnosis present

## 2021-02-26 DIAGNOSIS — T827XXA Infection and inflammatory reaction due to other cardiac and vascular devices, implants and grafts, initial encounter: Secondary | ICD-10-CM

## 2021-02-26 DIAGNOSIS — T827XXD Infection and inflammatory reaction due to other cardiac and vascular devices, implants and grafts, subsequent encounter: Secondary | ICD-10-CM

## 2021-02-26 DIAGNOSIS — Z87891 Personal history of nicotine dependence: Secondary | ICD-10-CM | POA: Diagnosis not present

## 2021-02-26 DIAGNOSIS — I471 Supraventricular tachycardia: Secondary | ICD-10-CM | POA: Diagnosis present

## 2021-02-26 DIAGNOSIS — T829XXD Unspecified complication of cardiac and vascular prosthetic device, implant and graft, subsequent encounter: Secondary | ICD-10-CM

## 2021-02-26 DIAGNOSIS — N179 Acute kidney failure, unspecified: Secondary | ICD-10-CM | POA: Diagnosis present

## 2021-02-26 DIAGNOSIS — Z79899 Other long term (current) drug therapy: Secondary | ICD-10-CM

## 2021-02-26 DIAGNOSIS — J95821 Acute postprocedural respiratory failure: Secondary | ICD-10-CM | POA: Diagnosis not present

## 2021-02-26 DIAGNOSIS — I5022 Chronic systolic (congestive) heart failure: Secondary | ICD-10-CM | POA: Diagnosis present

## 2021-02-26 DIAGNOSIS — I428 Other cardiomyopathies: Secondary | ICD-10-CM

## 2021-02-26 DIAGNOSIS — Z95811 Presence of heart assist device: Secondary | ICD-10-CM

## 2021-02-26 DIAGNOSIS — J939 Pneumothorax, unspecified: Secondary | ICD-10-CM | POA: Diagnosis not present

## 2021-02-26 DIAGNOSIS — I509 Heart failure, unspecified: Secondary | ICD-10-CM

## 2021-02-26 LAB — COMPREHENSIVE METABOLIC PANEL
ALT: 25 U/L (ref 0–44)
AST: 45 U/L — ABNORMAL HIGH (ref 15–41)
Albumin: 3.8 g/dL (ref 3.5–5.0)
Alkaline Phosphatase: 59 U/L (ref 38–126)
Anion gap: 12 (ref 5–15)
BUN: 6 mg/dL (ref 6–20)
CO2: 21 mmol/L — ABNORMAL LOW (ref 22–32)
Calcium: 8.6 mg/dL — ABNORMAL LOW (ref 8.9–10.3)
Chloride: 105 mmol/L (ref 98–111)
Creatinine, Ser: 0.76 mg/dL (ref 0.61–1.24)
GFR, Estimated: >60 mL/min (ref >60)
Glucose, Bld: 86 mg/dL (ref 70–99)
Potassium: 3.4 mmol/L — ABNORMAL LOW (ref 3.5–5.1)
Sodium: 138 mmol/L (ref 135–145)
Total Bilirubin: 0.4 mg/dL (ref 0.3–1.2)
Total Protein: 7.4 g/dL (ref 6.5–8.1)

## 2021-02-26 LAB — CBC
HCT: 21.8 % — ABNORMAL LOW (ref 39.0–52.0)
Hemoglobin: 7 g/dL — ABNORMAL LOW (ref 13.0–17.0)
MCH: 30.3 pg (ref 26.0–34.0)
MCHC: 32.1 g/dL (ref 30.0–36.0)
MCV: 94.4 fL (ref 80.0–100.0)
Platelets: 173 10*3/uL (ref 150–400)
RBC: 2.31 MIL/uL — ABNORMAL LOW (ref 4.22–5.81)
RDW: 20.3 % — ABNORMAL HIGH (ref 11.5–15.5)
WBC: 5.4 10*3/uL (ref 4.0–10.5)
nRBC: 0 % (ref 0.0–0.2)

## 2021-02-26 LAB — PROTIME-INR
INR: 3 — ABNORMAL HIGH (ref 0.8–1.2)
Prothrombin Time: 30.9 seconds — ABNORMAL HIGH (ref 11.4–15.2)

## 2021-02-26 LAB — PREALBUMIN: Prealbumin: 18.6 mg/dL (ref 18–38)

## 2021-02-26 LAB — PREPARE RBC (CROSSMATCH)

## 2021-02-26 LAB — RESP PANEL BY RT-PCR (FLU A&B, COVID) ARPGX2
Influenza A by PCR: NEGATIVE
Influenza B by PCR: NEGATIVE
SARS Coronavirus 2 by RT PCR: NEGATIVE

## 2021-02-26 LAB — MRSA NEXT GEN BY PCR, NASAL: MRSA by PCR Next Gen: NOT DETECTED

## 2021-02-26 LAB — SEDIMENTATION RATE: Sed Rate: 40 mm/hr — ABNORMAL HIGH (ref 0–16)

## 2021-02-26 LAB — MAGNESIUM: Magnesium: 1.9 mg/dL (ref 1.7–2.4)

## 2021-02-26 LAB — LACTATE DEHYDROGENASE: LDH: 147 U/L (ref 98–192)

## 2021-02-26 LAB — C-REACTIVE PROTEIN: CRP: <0.5 mg/dL (ref <1.0)

## 2021-02-26 MED ORDER — ACETAMINOPHEN 325 MG PO TABS: 650.0000 mg | ORAL_TABLET | ORAL | Status: DC | PRN

## 2021-02-26 MED ORDER — POTASSIUM CHLORIDE CRYS ER 20 MEQ PO TBCR
40.0000 meq | EXTENDED_RELEASE_TABLET | Freq: Once | ORAL | Status: AC
  Administered 2021-02-26: 40 meq via ORAL
  Filled 2021-02-26: qty 2

## 2021-02-26 MED ORDER — SODIUM CHLORIDE 0.9% IV SOLUTION: Freq: Once | INTRAVENOUS | Status: AC

## 2021-02-26 MED ORDER — SODIUM CHLORIDE 0.9 % IV SOLN
2.0000 g | Freq: Three times a day (TID) | INTRAVENOUS | Status: DC
  Administered 2021-02-26 – 2021-02-27 (×4): 2 g via INTRAVENOUS
  Filled 2021-02-26 (×4): qty 2

## 2021-02-26 MED ORDER — VANCOMYCIN HCL IN DEXTROSE 1-5 GM/200ML-% IV SOLN
1000.0000 mg | Freq: Two times a day (BID) | INTRAVENOUS | Status: DC
  Administered 2021-02-26 – 2021-02-28 (×4): 1000 mg via INTRAVENOUS
  Filled 2021-02-26 (×5): qty 200

## 2021-02-26 MED ORDER — SILDENAFIL CITRATE 20 MG PO TABS
20.0000 mg | ORAL_TABLET | Freq: Three times a day (TID) | ORAL | Status: DC
  Administered 2021-02-26 – 2021-03-16 (×53): 20 mg via ORAL
  Filled 2021-02-26 (×55): qty 1

## 2021-02-26 NOTE — H&P (Addendum)
Advanced Heart Failure VAD History and Physical Note   PCP-Cardiologist: Dr. Gala Romney   Reason for Admission: Driveline Infection   HPI:    [name] is a 36 y/o male from [place]  w/ h/o severe systolic HF due to NICM EF 15% and prior polysubstance abuse (cocaine and ETOH)   Underwent HM-3 LVAD placement on 02/26/19 by Dr. Morton Peters. Extubated 1/25. Required milrinone post op for RV support. Milrinone eventually weaned and Co-ox remained stable of inotropes at 61%. Later placed on sildenafil for continued RV support. Post op recovery complicated by hypoxic respiratory failure, moderate sized rt pneumothorax requiring CT placement.   Admitted 6/28-08/06/19 for AKI and hypotension in setting of volume depletion due to GI bug.Had multiple flow alarms. Echo EF 40-45%    Admitted 12/21 for anemia in setting of supratherapeutic INR (8.0) and bleeding from a sore on his lip. Transfused but did not bleed any further. Developed refractory low flow alarms on VAD with drop in platelets and rising LDH. RUQ U/S was normal (doubt splenic sequestration). Peripheral smear showed no evidence of hemolysis.Patient placed on his left side and low flow alarms resolved. It was suspected possible obstruction/clot of outflow graft causing Plts and Hgb to drop. CT 12/10 showed no evidence of outflow graft kink/clot.  LDH trended back down to his baseline, below 200s. Plts started to trend back up.   Admitted 3/25-4/21/22 for MSSA DL infection requiring surgical debridement. D/c'd to [facility] for IV abx.     Here for routine f/u. Now w/ recurrent DL infection. Purulent drainage from exit site + abscess. Denies subjective fever/ chills. VAD parameters stable. Volume stable. No dyspnea.    LVAD INTERROGATION:  HeartMate II LVAD:  Flow 3.5 liters/min, speed 5450, power 3.8, PI 5.1.    Fixed speed: 5400 Low speed limit: 5100  Review of Systems: [y] = yes, [ ]  = no   General: Weight gain [ ] ; Weight loss [ ] ;  Anorexia [ ] ; Fatigue [ ] ; Fever [ ] ; Chills [ ] ; Weakness [ ]   Cardiac: Chest pain/pressure [ ] ; Resting SOB [ ] ; Exertional SOB [ ] ; Orthopnea [ ] ; Pedal Edema [ ] ; Palpitations [ ] ; Syncope [ ] ; Presyncope [ ] ; Paroxysmal nocturnal dyspnea[ ]   Pulmonary: Cough [ ] ; Wheezing[ ] ; Hemoptysis[ ] ; Sputum [ ] ; Snoring [ ]   GI: Vomiting[ ] ; Dysphagia[ ] ; Melena[ ] ; Hematochezia [ ] ; Heartburn[ ] ; Abdominal pain [ ] ; Constipation [ ] ; Diarrhea [ ] ; BRBPR [ ]   GU: Hematuria[ ] ; Dysuria [ ] ; Nocturia[ ]   Vascular: Pain in legs with walking [ ] ; Pain in feet with lying flat [ ] ; Non-healing sores [ Y]; Stroke [ ] ; TIA [ ] ; Slurred speech [ ] ;  Neuro: Headaches[ ] ; Vertigo[ ] ; Seizures[ ] ; Paresthesias[ ] ;Blurred vision [ ] ; Diplopia [ ] ; Vision changes [ ]   Ortho/Skin: Arthritis [ ] ; Joint pain [ ] ; Muscle pain [ ] ; Joint swelling [ ] ; Back Pain [ ] ; Rash [ ]   Psych: Depression[ ] ; Anxiety[ ]   Heme: Bleeding problems [ ] ; Clotting disorders [ ] ; Anemia [ ]   Endocrine: Diabetes [ ] ; Thyroid dysfunction[ ]     Home Medications Prior to Admission medications   Medication Sig Start Date End Date Taking? Authorizing Provider  carvedilol (COREG) 3.125 MG tablet Take 3 tablets (9.375 mg total) by mouth 2 (two) times daily with a meal. Patient not taking: Reported on 10/23/2020 05/25/20   [name] D, NP  losartan (COZAAR) 25 MG tablet Take  1 tablet (25 mg total) by mouth daily. Patient not taking: Reported on 12/25/2020 05/25/20   Darrick Grinder D, NP  magnesium oxide (MAG-OX) 400 (241.3 Mg) MG tablet Take 1 tablet (400 mg total) by mouth daily. Patient not taking: Reported on 10/23/2020 05/25/20   Darrick Grinder D, NP  pantoprazole (PROTONIX) 40 MG tablet Take 1 tablet (40 mg total) by mouth daily. Patient not taking: Reported on 02/26/2021 12/25/20   Daira Hine, Shaune Pascal, MD  potassium chloride SA (KLOR-CON M20) 20 MEQ tablet Take 2 tablets (40 mEq total) by mouth daily. Take 2 tablets (40 meq) by mouth daily, or as  directed by heart failure clinic 08/03/20 02/26/21  Bensen Chadderdon, Shaune Pascal, MD  sildenafil (REVATIO) 20 MG tablet TAKE 1 TABLET BY MOUTH THREE TIMES DAILY 08/23/20   Kittie Krizan, Shaune Pascal, MD  warfarin (COUMADIN) 5 MG tablet TAKE 1/2 tab on Sun, Tues, Thurs.All other days take 1 tablet, or as directed by heart failure clinic 11/23/20   Denaya Horn, Shaune Pascal, MD  warfarin (COUMADIN) 5 MG tablet Take 2.5 mg (1/2 tab) every Monday and 1 tablet (5 mg) all other days or as directed by HF Clinic 11/21/20   Shemekia Patane, Shaune Pascal, MD    Past Medical History: Past Medical History:  Diagnosis Date   Dilated cardiomyopathy ([place])    Dilated cardiomyopathy ([place]) 02/2019   Polysubstance abuse ([place])     Past Surgical History: Past Surgical History:  Procedure Laterality Date   APPLICATION OF WOUND VAC Left 05/16/2020   Procedure: APPLICATION OF WOUND VAC;  Surgeon: Gaye Pollack, MD;  Location: [place];  Service: Vascular;  Laterality: Left;   INCISION AND DRAINAGE OF WOUND Left 05/16/2020   Procedure: IRRIGATION AND DEBRIDEMENT VAD DRIVELINE WOUND;  Surgeon: Gaye Pollack, MD;  Location: [facility];  Service: Vascular;  Laterality: Left;   INSERTION OF IMPLANTABLE LEFT VENTRICULAR ASSIST DEVICE N/A 02/26/2019   Procedure: INSERTION OF IMPLANTABLE LEFT VENTRICULAR ASSIST DEVICE;  Surgeon: Ivin Poot, MD;  Location: [place];  Service: Open Heart Surgery;  Laterality: N/A;  HM3   IR THORACENTESIS ASP PLEURAL SPACE W/IMG GUIDE  03/10/2019   MULTIPLE EXTRACTIONS WITH ALVEOLOPLASTY N/A 05/04/2020   Procedure: MULTIPLE EXTRACTION TEETH FIVE AND FIFTEEN WITH ALVEOLOPLASTY;  Surgeon: Charlaine Dalton, DMD;  Location: [place];  Service: Dentistry;  Laterality: N/A;   RIGHT HEART CATH N/A 02/25/2019   Procedure: RIGHT HEART CATH;  Surgeon: Jolaine Artist, MD;  Location: [place] [facility];  Service: Cardiovascular;  Laterality: N/A;   RIGHT/LEFT HEART CATH AND CORONARY ANGIOGRAPHY N/A 02/11/2019   Procedure: RIGHT/LEFT HEART  CATH AND CORONARY ANGIOGRAPHY;  Surgeon: Jolaine Artist, MD;  Location: [place] [facility];  Service: Cardiovascular;  Laterality: N/A;   TEE WITHOUT CARDIOVERSION N/A 02/26/2019   Procedure: TRANSESOPHAGEAL ECHOCARDIOGRAM (TEE);  Surgeon: Prescott Gum, Collier Salina, MD;  Location: [place];  Service: Open Heart Surgery;  Laterality: N/A;   VIDEO BRONCHOSCOPY N/A 02/26/2019   Procedure: Video Bronchoscopy;  Surgeon: Ivin Poot, MD;  Location: [place];  Service: Open Heart Surgery;  Laterality: N/A;    Family History: Family History  Problem Relation Age of Onset   CAD Father     Social History: Social History   Socioeconomic History   Marital status: Single    Spouse name: Not on file   Number of children: Not on file   Years of education: Not on file   Highest education level: Not on file  Occupational  History   Not on file  Tobacco Use   Smoking status: Former    Types: Cigarettes   Smokeless tobacco: Never  Vaping Use   Vaping Use: Some days  Substance and Sexual Activity   Alcohol use: Yes   Drug use: Not Currently    Comment: Hx of cocaine use   Sexual activity: Not on file  Other Topics Concern   Not on file  Social History Narrative   Not on file   Social Determinants of Health   Financial Resource Strain: Not on file  Food Insecurity: Not on file  Transportation Needs: Not on file  Physical Activity: Not on file  Stress: Not on file  Social Connections: Not on file    Allergies:  No Known Allergies  Objective:    Vital Signs  Mean arterial Pressure 92   Physical Exam    General:  Well appearing young male. No resp difficulty HEENT: Normal Neck: supple. JVP not elevated. Carotids 2+ bilat; no bruits. No lymphadenopathy or thyromegaly appreciated. Cor: Mechanical heart sounds with LVAD hum present. Lungs: Clear Abdomen: soft, nontender, nondistended. No hepatosplenomegaly. No bruits or masses. Good bowel sounds. Driveline: C/D/I; securement device  intact and driveline incorporated + purulent drainage from exit site + abscess  Extremities: no cyanosis, clubbing, rash, edema Neuro: alert & orientedx3, cranial nerves grossly intact. moves all 4 extremities w/o difficulty. Affect pleasant   Telemetry   N/A   EKG   12 lead pending   Labs    Basic Metabolic Panel: No results for input(s): NA, K, CL, CO2, GLUCOSE, BUN, CREATININE, CALCIUM, MG, PHOS in the last 168 hours.  Liver Function Tests: No results for input(s): AST, ALT, ALKPHOS, BILITOT, PROT, ALBUMIN in the last 168 hours. No results for input(s): LIPASE, AMYLASE in the last 168 hours. No results for input(s): AMMONIA in the last 168 hours.  CBC: No results for input(s): WBC, NEUTROABS, HGB, HCT, MCV, PLT in the last 168 hours.  Cardiac Enzymes: No results for input(s): CKTOTAL, CKMB, CKMBINDEX, TROPONINI in the last 168 hours.  BNP: BNP (last 3 results) No results for input(s): BNP in the last 8760 hours.  ProBNP (last 3 results) No results for input(s): PROBNP in the last 8760 hours.   CBG: No results for input(s): GLUCAP in the last 168 hours.  Coagulation Studies: No results for input(s): LABPROT, INR in the last 72 hours.  Other results: EKG: 12 lead pending  Imaging    No results found.    Patient Profile:   36 y/o w/ chronic systolic heart failure, NICM, s/p MH3 LVAD w/ prior history of MSSA DL infection, being admitted for recurrent DL infection.   Assessment/Plan:     1. Recurrent DL Infection  - previous MSSA DL infection 3/22, treated w/ surgical debridement and IV abx - Now w/ recurrent infection, purulent drainage from exit site + abscess - Plan direct admit - Send wound and Blood Cx - Start IV Abx, Vanc + cefepime. Narrow based on culture data    - CT of Chest/Abdomen/Pelvis w/ contrast - Dr. Prescott Gum notified and will see today, suspect he will need wound debridement  - Hold Coumadin and start heparin gtt once INR < 2.0.    2. Chronic systolic HF due to severe NICM - Echo EF 10% with biventricular failure - HM-3 VAD implant 02/26/19 - Echo 7/21 EF 40-45% - Stable NYHA I. Volume status looks good.  - Continue VAD speed at 5400 - Continue  sildenafil - Has been off carvedilol  - MAPs mildly elevated (92) hasn't taken AM meds yet    3. VAD - Admit 12/21 with multiple low flow alarms. Rising LDH and decreasing PLTs. Suspected outflow graft kink occlusion. Patient placed on his left side and resolved.  - VAD interrogated personally. Parameters stable. - Admit per above for DL infection - Daily dressing changes  - Check and follow INR and LDH     4. HTN - MAPs mildly elevated (92) hasn't taken AM meds yet  - Continue home regimen      I reviewed the LVAD parameters from today, and compared the results to the patient's prior recorded data.  No programming changes were made.  The LVAD is functioning within specified parameters.  The patient performs LVAD self-test daily.  LVAD interrogation was negative for any significant power changes, alarms or PI events/speed drops.  LVAD equipment check completed and is in good working order.  Back-up equipment present.   LVAD education done on emergency procedures and precautions and reviewed exit site care.  Length of Stay: 0  Lyda Jester, PA-C 02/26/2021, 12:00 PM  VAD Team Pager [phone] (7am - 7am) +++VAD ISSUES ONLY+++   Advanced Heart Failure Team Pager [phone] (M-F; [place])  Please contact [facility] for night-coverage after hours (5p -7a ) and weekends on [email or web address] for all non- LVAD Issues  Patient seen and examined with the above-signed Advanced Practice Provider and/or Housestaff. I personally reviewed laboratory data, imaging studies and relevant notes. I independently examined the patient and formulated the important aspects of the plan. I have edited the note to reflect any of my changes or salient points. I have personally discussed the  plan with the patient and/or family.  36 y/o male with severe systolic HF s/p VAD with h/o driveline infection. Presents to clinic today with recurrent DL infection and abdominal abscess.   General:  Ill appearing NAD.  HEENT: normal  Neck: supple. JVP not elevated.  Carotids 2+ bilat; no bruits. No lymphadenopathy or thryomegaly appreciated. Cor: LVAD hum.  Lungs: Clear. Abdomen: obese soft, nontender, non-distended. No hepatosplenomegaly. No bruits or masses. Good bowel sounds. Driveline site draining pus. Draining abscess at site of previous DL debridement Extremities: no cyanosis, clubbing, rash. Warm no edema  Neuro: alert & oriented x 3. No focal deficits. Moves all 4 without problem   Admit for IV abx. Check CT and cultures. Dr. Prescott Gum aware. To OR for I&D if INR suitable.   Glori Bickers, MD  3:13 PM

## 2021-02-26 NOTE — Progress Notes (Signed)
K 3.4. Supp w/ KCl.   Hgb 7.0. Transfuse 1 u RBCs.   Robbie Lis, PA-C

## 2021-02-26 NOTE — Progress Notes (Signed)
Pharmacy Antibiotic Note  Jon Martin is a 36 y.o. male admitted on 02/26/2021 with LVAD driveline infection. Pharmacy has been consulted for vancomycin + cefepime dosing. Pt has hx MSSA driveline infection 04/2020 s/p IV ABX.   Plan: Vancomycin 1000mg  IV q12h - est AUC 468 Cefepime 2g IV q8h Watch Cr, cultures Vancomycin levels at Css     No data recorded.  No results for input(s): WBC, CREATININE, LATICACIDVEN, VANCOTROUGH, VANCOPEAK, VANCORANDOM, GENTTROUGH, GENTPEAK, GENTRANDOM, TOBRATROUGH, TOBRAPEAK, TOBRARND, AMIKACINPEAK, AMIKACINTROU, AMIKACIN in the last 168 hours.  CrCl cannot be calculated (Patient's most recent lab result is older than the maximum 21 days allowed.).    No Known Allergies  Antimicrobials this admission: Vancomycin 1/23 >>  Cefepime 1/23 >>   Dose adjustments this admission: none  Microbiology results: pending  Thank you for allowing pharmacy to be a part of this patients care.   2/23, PharmD, BCPS, [facility] Clinical Pharmacist [phone] Please check AMION for all [facility] numbers 02/26/2021

## 2021-02-26 NOTE — Progress Notes (Signed)
Patient presents for 2 month f/u with 2 year Intermacs in VAD Clinic today alone. Reports no problems with VAD equipment or concerns with drive line.  Patient reports he has been feeling good. Denies lightheadedness, dizziness, falls, or signs of bleeding.   Patient brought home meds for review. He is taking taking sildenafil, potassium and warfarin. He reports he didn't know he was suppose to be taking Carvedilol, Losartan, Magnesium, and Protonix.   Pt wearing an abdominal binder today stating he ran out of drive line dressing kits. Exit site is covered by a piece of gauze and a lot of tape. Drainage is seeping out the bottom gauze bandage. No anchor in place. Foul odor noted when patient opened abdominal binder. Reports he sustained trauma to site last night when he dropped his equipment. Pt reports he has been cleaning site "as you guys told me to" and does not understand why his site is infected. Dressing change as documented below. Wound culture or exit site and previous abdominal incision site obtained. Noted to have large amount of thick yellow/green slimy drainage from both areas. Will plan to admit today for IV antibiotics and debridement. Dr Donata Clay aware of pending admission.  Pt's modular cable is tangled and bent at an extreme angle. He has been educated multiple times about how to properly carry equipment. Modular cable replaced today with  Lot #: [id number] Manufacture date: 10/06/20 Expiration date: 10/07/23.   Multiple "controller clock not set" noted on VAD interrogation. Pt reports he changed his controller out after his last visit, as he thought that he was suppose to after back up controller back up battery was done charging on batteries when he left clinic. He was not instructed to do this by VAD coordinator. Controller in back up bag used to change modular cable today. Therefore original primary controller is now back on patient.   Labs, blood cultures x 2, and wound cultures x 2  obtained today.   Pt did not bring equipment for annual maintenance today.   Vital Signs:  Temp:  Doppler Pressure: 108 Automatc BP: 109/82 (92) HR: 87 SPO2: 100 %   Weight: 121.2 lb w/o eqt Last clinic weight: 125 lb w/o eqt   VAD Indication: Destination Therapy LVAD due to narcotic use   LVAD assessment: HM III: Speed:  5400 rpms                                 Flow: 3.9      Power: 3.8w                                              PI: 6.5                                                   Alarms: none Events: multiple controller clock not set as documented above  Fixed speed: 5400 Low speed limit: 5100  Primary Controller: Replace back up battery in 8 months Back up controller:   Replace back up battery in 25 months    I reviewed the LVAD parameters from today and compared the results to the patient's prior recorded data.  LVAD interrogation was NEGATIVE for significant power changes, NEGATIVE for clinical alarms and STABLE for PI events/speed drops. No programming changes were made and pump is functioning within specified parameters. Pt is performing daily controller and system monitor self tests along with completing weekly and monthly maintenance for LVAD equipment.   LVAD equipment check completed and is in good working order. Back-up equipment present.   Annual Equipment Maintenance on UBC/PM was performed 2/22.  Exit Site Care: Existing VAD dressing removed and site care performed using sterile technique. Drive line exit site cleaned with betadine swab x 2, and rinsed with sterile saline, and allowed to dry, and abdominal pad/gauze dressing with silver strip re-applied. Exit site partially incorporated with velour fully implanted. Large amount of yellow/green thick/slimy drainage with dried blood noted at site, with foul odor, and rash noted all around site. Site tunnels 4.5 cm. Pt denies fever or chills. Anchor reapplied.   Abdominal wound/abscess x 2 with thick  yellow/bloody drainage. Tunneling noted 4 cm from one abscessed area to the other above the umbilicus. Site cleansed with betadine x 2, allowed to dry, and rinsed with sterile saline. Covered with ABD pad.    Plan for IV antibiotics and drive line debridement.   Device: N/A  BP & Labs:  Doppler BP 108 - Doppler is reflecting modified systolic   Hgb pending - No S/S of bleeding. Specifically denies melena/BRBPR or nosebleeds.   LDH pending with established baseline of 250 - 430. Denies tea-colored urine. No power elevations noted on interrogation.    2 year Intermacs follow up completed including:  Quality of Life, KCCQ-12, and Neurocognitive trail making.   6 minute walk not completed today due to drive line infection.  Patient Goals: To be free from drive line infection.   [facility] Cardiomyopathy Questionnaire  KCCQ-12 02/26/2021 10/23/2020 03/29/2020  1 a. Ability to shower/bathe Not at all limited Not at all limited Other, Did not do  1 b. Ability to walk 1 block Not at all limited Not at all limited Extremely limited  1 c. Ability to hurry/jog Not at all limited Not at all limited Extremely limited  2. Edema feet/ankles/legs Never over the past 2 weeks Never over the past 2 weeks Never over the past 2 weeks  3. Limited by fatigue Less than once a week Never over the past 2 weeks Never over the past 2 weeks  4. Limited by dyspnea Never over the past 2 weeks Never over the past 2 weeks Never over the past 2 weeks  5. Sitting up / on 3+ pillows Never over the past 2 weeks Never over the past 2 weeks Never over the past 2 weeks  6. Limited enjoyment of life Moderately limited Limited quite a bit Not limited at all  7. Rest of life w/ symptoms Not at all satisfied Not at all satisfied Not at all satisfied  8 a. Participation in hobbies N/A, did not do for other reasons Did not limit at all N/A, did not do for other reasons  8 b. Participation in chores N/A, did not do for other  reasons Did not limit at all N/A, did not do for other reasons  8 c. Visiting family/friends N/A, did not do for other reasons Did not limit at all N/A, did not do for other reasons     Patient Instructions: Admit to 2C for IV antibiotics and drive line debridement  Alyce Pagan RN VAD Coordinator  Office: [phone]  24/7 Pager: [phone]

## 2021-02-27 ENCOUNTER — Inpatient Hospital Stay (HOSPITAL_COMMUNITY): Payer: Medicaid Other

## 2021-02-27 DIAGNOSIS — T827XXA Infection and inflammatory reaction due to other cardiac and vascular devices, implants and grafts, initial encounter: Principal | ICD-10-CM

## 2021-02-27 DIAGNOSIS — I428 Other cardiomyopathies: Secondary | ICD-10-CM | POA: Diagnosis not present

## 2021-02-27 DIAGNOSIS — I10 Essential (primary) hypertension: Secondary | ICD-10-CM | POA: Diagnosis not present

## 2021-02-27 DIAGNOSIS — I5022 Chronic systolic (congestive) heart failure: Secondary | ICD-10-CM | POA: Diagnosis not present

## 2021-02-27 DIAGNOSIS — Z95811 Presence of heart assist device: Secondary | ICD-10-CM

## 2021-02-27 DIAGNOSIS — I5082 Biventricular heart failure: Secondary | ICD-10-CM

## 2021-02-27 LAB — TYPE AND SCREEN
ABO/RH(D): O POS
Antibody Screen: NEGATIVE
Unit division: 0

## 2021-02-27 LAB — BASIC METABOLIC PANEL
Anion gap: 8 (ref 5–15)
BUN: 7 mg/dL (ref 6–20)
CO2: 23 mmol/L (ref 22–32)
Calcium: 8.6 mg/dL — ABNORMAL LOW (ref 8.9–10.3)
Chloride: 103 mmol/L (ref 98–111)
Creatinine, Ser: 0.78 mg/dL (ref 0.61–1.24)
GFR, Estimated: >60 mL/min (ref >60)
Glucose, Bld: 89 mg/dL (ref 70–99)
Potassium: 3.5 mmol/L (ref 3.5–5.1)
Sodium: 134 mmol/L — ABNORMAL LOW (ref 135–145)

## 2021-02-27 LAB — BPAM RBC
Blood Product Expiration Date: 202302162359
ISSUE DATE / TIME: 202301232201
Unit Type and Rh: 5100

## 2021-02-27 LAB — CBC
HCT: 25.4 % — ABNORMAL LOW (ref 39.0–52.0)
Hemoglobin: 8.4 g/dL — ABNORMAL LOW (ref 13.0–17.0)
MCH: 30.4 pg (ref 26.0–34.0)
MCHC: 33.1 g/dL (ref 30.0–36.0)
MCV: 92 fL (ref 80.0–100.0)
Platelets: 146 10*3/uL — ABNORMAL LOW (ref 150–400)
RBC: 2.76 MIL/uL — ABNORMAL LOW (ref 4.22–5.81)
RDW: 18.2 % — ABNORMAL HIGH (ref 11.5–15.5)
WBC: 4.8 10*3/uL (ref 4.0–10.5)
nRBC: 0 % (ref 0.0–0.2)

## 2021-02-27 LAB — ECHOCARDIOGRAM LIMITED
Height: 66 in
Weight: 1791.9 oz

## 2021-02-27 LAB — LACTATE DEHYDROGENASE: LDH: 154 U/L (ref 98–192)

## 2021-02-27 LAB — PROTIME-INR
INR: 2.3 — ABNORMAL HIGH (ref 0.8–1.2)
Prothrombin Time: 25.1 seconds — ABNORMAL HIGH (ref 11.4–15.2)

## 2021-02-27 MED ORDER — POTASSIUM CHLORIDE CRYS ER 20 MEQ PO TBCR
40.0000 meq | EXTENDED_RELEASE_TABLET | Freq: Once | ORAL | Status: AC
  Administered 2021-02-27: 14:00:00 40 meq via ORAL
  Filled 2021-02-27: qty 2

## 2021-02-27 MED ORDER — IOHEXOL 350 MG/ML SOLN
100.0000 mL | Freq: Once | INTRAVENOUS | Status: AC | PRN
  Administered 2021-02-27: 14:00:00 100 mL via INTRAVENOUS

## 2021-02-27 MED ORDER — IOHEXOL 9 MG/ML PO SOLN
ORAL | Status: AC
  Administered 2021-02-27: 08:00:00 500 mL
  Filled 2021-02-27: qty 1000

## 2021-02-27 NOTE — Progress Notes (Signed)
LVAD Coordinator Rounding Note:  Admitted 02/26/21 due to Dr. Prescott Gum service for VAD drive line infection.   HM III LVAD implanted on 02/26/19 by Dr. Maren Beach under Destination Therapy criteria.  Patient lying asleep in bed upon my arrival. Awakens easily. Denies complaints. CT abd/pelvis with contrast pending. Per Dr Donata Clay will plan for drive line debridement on Friday in the OR.   Wound cultures with preliminary growth of staph aureus. Currently on Cefepime 2 g IV every 8 hours and Vancomycin 1000 mg IV every 12 hours.   Vital signs: Temp: 98.7 HR: 80 Doppler Pressure: 88 Automatic BP: 88/68 (76) O2 Sat: 98% RA  Wt: 111.9>  lbs  LVAD interrogation reveals:  Speed: 5400 Flow: 3.6 Power: 3.9 w PI: 4.8 Hct: 20 - do not change  Alarms: none Events: 10 PI events so far today Fixed speed: 5400 Low speed limit: 5100  Drive Line:  Existing VAD dressing removed and site care performed using sterile technique. Drive line exit site cleaned with CHG x 2, and rinsed with sterile saline, and allowed to dry, and abdominal pad/gauze dressing with silver strip re-applied. Exit site partially incorporated with velour fully implanted. Moderate amount of yellow/green thick/slimy/bloody drainage, with foul odor, and rash noted all around site. Site tunnels 4.5 cm. Pt denies fever or chills. Anchor reapplied. Daily dressing changes per bedside RN or VAD coordinator. Next dressing change due 02/28/21.   Abdominal Abcsess: Abdominal wound/abscess x 2 with thick yellow/bloody drainage. Tunneling noted 4 cm from one abscessed area to the other above the umbilicus. Site cleansed with CHG swab x 2, allowed to dry, and rinsed with sterile saline. Covered with ABD pad.     Labs:  LDH trend: 154  INR trend: 2.3  Hgb trend: 7.0>8.4  Anticoagulation Plan: -INR Goal: 2.0 - 2.5 -ASA Dose: none  Blood Products:  - 02/26/21>>1 units PCs  Device: N/A  Infection:  02/26/21 Drive line wound  culture>> few staph aureus, final pending 02/26/21 Abdominal abscess culture>> few staph aureus, final pending 02/26/21 Blood cultures >> no growth < 24 hours   Drips:    Plan/Recommendations:  1. Call VAD Coordinator if any VAD equipment or drive line issues. 2. Daily drive line dressing changes per bedside RN or VAD coordinator  Alyce Pagan RN VAD Coordinator  Office: [phone]  24/7 Pager: [phone]

## 2021-02-27 NOTE — TOC Initial Note (Signed)
Transition of Care [facility]) - Initial/Assessment Note    Patient Details  Name: Jon Martin MRN: [id number] Date of Birth: 01/17/1986  Transition of Care [facility]) CM/SW Contact:    Elliot Cousin, RN Phone Number: [phone] 02/27/2021, 5:01 PM  Clinical Narrative:                 HF TOC CM spoke to pt at bedside. States he did go to SNF in the past to receive IV abx but prefers to go home. States lives at home with brother, Jon Martin. Will need to arrange [facility] RN for IV abx at home and wound vac. TOC CM contacted [facility] Coordinator, Pam with referral with possible dc home needing IV abx.   Expected Discharge Plan: Home w Home Health Services Barriers to Discharge: Continued Medical Work up   Patient Goals and CMS Choice Patient states their goals for this hospitalization and ongoing recovery are:: wants to return home CMS Medicare.gov Compare Post Acute Care list provided to:: Patient Choice offered to / list presented to : Patient  Expected Discharge Plan and Services Expected Discharge Plan: Home w Home Health Services   Discharge Planning Services: CM Consult Post Acute Care Choice: Home Health Living arrangements for the past 2 months: Single Family Home   Prior Living Arrangements/Services Living arrangements for the past 2 months: Single Family Home Lives with:: Siblings, Self Patient language and need for interpreter reviewed:: Yes Do you feel safe going back to the place where you live?: Yes      Need for Family Participation in Patient Care: Yes (Comment) Care giver support system in place?: Yes (comment) Current home services: DME (LVAD) Criminal Activity/Legal Involvement Pertinent to Current Situation/Hospitalization: No - Comment as needed  Activities of Daily Living Home Assistive Devices/Equipment: None ADL Screening (condition at time of admission) Patient's cognitive ability adequate to safely complete daily activities?: Yes Is the patient  deaf or have difficulty hearing?: No Does the patient have difficulty seeing, even when wearing glasses/contacts?: No Does the patient have difficulty concentrating, remembering, or making decisions?: Yes Patient able to express need for assistance with ADLs?: Yes Does the patient have difficulty dressing or bathing?: No Independently performs ADLs?: Yes (appropriate for developmental age) Does the patient have difficulty walking or climbing stairs?: Yes Weakness of Legs: None Weakness of Arms/Hands: None  Permission Sought/Granted Permission sought to share information with : Case Manager, Family Supports, PCP Permission granted to share information with : Yes, Verbal Permission Granted  Share Information with NAME: Caidyn Henricksen  Permission granted to share info w AGENCY: Home Health  Permission granted to share info w Relationship: brother  Permission granted to share info w Contact Information: [phone]  Emotional Assessment Appearance:: Appears stated age Attitude/Demeanor/Rapport: Engaged Affect (typically observed): Accepting Orientation: : Oriented to Self, Oriented to Place, Oriented to Situation, Oriented to  Time   Psych Involvement: No (comment)  Admission diagnosis:  Infection associated with driveline of left ventricular assist device (LVAD) (HCC) [Z02.7XXA] Patient Active Problem List   Diagnosis Date Noted   Malnutrition of moderate degree 05/23/2020   MSSA (methicillin susceptible Staphylococcus aureus) infection    Loss of teeth due to extraction    Retained dental root    Chronic periodontitis    Complication involving left ventricular assist device (LVAD) 04/28/2020   Dental caries 04/28/2020   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/2020   Left ventricular assist device (LVAD) complication 01/12/2020  Dehydration, moderate 08/02/2019   Chronic systolic heart failure (HCC) 08/02/2019   AKI (acute kidney  injury) (HCC) 08/02/2019   Intractable vomiting with nausea 08/02/2019   LVAD (left ventricular assist device) present (HCC) 08/02/2019   Hypokalemia 08/02/2019   Congestive heart failure (HCC)    Cardiogenic shock (HCC)    Goals of care, counseling/discussion    Protein-calorie malnutrition, severe 02/11/2019   Dilated cardiomyopathy (HCC) 02/10/2019   PCP:  Nonah Mattes, MD Pharmacy:   [facility] [address], [place] - [address] [place] [address] Phone: [phone] Fax: [phone]  [facility] [address] [place], [place] - [address] [place] [address] Phone: [phone] Fax: [phone]     Social Determinants of Health (SDOH) Interventions    Readmission Risk Interventions No flowsheet data found.

## 2021-02-27 NOTE — Progress Notes (Signed)
°  Echocardiogram 2D Echocardiogram has been performed.  Delcie Roch 02/27/2021, 3:48 PM

## 2021-02-27 NOTE — Progress Notes (Signed)
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 after repositioning of DL April 2022 for staph infection at original exit site CT abd pending INR 3.->>2.2 Will plan surgical debridement Friday  in OR with VAC   Objective: Vital signs in last 24 hours: Temp:  [98 F (36.7 C)-98.9 F (37.2 C)] 98.7 F (37.1 C) (01/24 0753) Pulse Rate:  [72-105] 90 (01/24 0808) Cardiac Rhythm: Normal sinus rhythm (01/24 0808) Resp:  [12-27] 16 (01/24 0808) BP: (80-109)/(0-87) 80/69 (01/24 0808) SpO2:  [95 %-100 %] 95 % (01/24 0808) Weight:  [50.8 kg-55 kg] 50.8 kg (01/24 0500)  Hemodynamic parameters for last 24 hours:    Intake/Output from previous day: 01/23 0701 - 01/24 0700 In: 1393.3 [P.O.:240; Blood:353.3; IV Piggyback:799.9] Out: 300 [Urine:300] Intake/Output this shift: No intake/output data recorded.  Drainage on DL dressing  Lab Results: Recent Labs    02/26/21 1414 02/27/21 0552  WBC 5.4 4.8  HGB 7.0* 8.4*  HCT 21.8* 25.4*  PLT 173 146*   BMET:  Recent Labs    02/26/21 1414 02/27/21 0552  NA 138 134*  K 3.4* 3.5  CL 105 103  CO2 21* 23  GLUCOSE 86 89  BUN 6 7  CREATININE 0.76 0.78  CALCIUM 8.6* 8.6*    PT/INR:  Recent Labs    02/27/21 0552  LABPROT 25.1*  INR 2.3*   ABG    Component Value Date/Time   PHART 7.549 (H) 03/02/2019 0358   HCO3 26.5 03/02/2019 0358   TCO2 27 03/02/2019 0358   ACIDBASEDEF 0.5 02/28/2019 1740   O2SAT 62.2 03/15/2019 0335   CBG (last 3)  No results for input(s): GLUCAP in the last 72 hours.  Assessment/Plan: S/P  Transfuse HB > 9 Repeat echocardiogram preop   LOS: 1 day    Lovett Sox 02/27/2021

## 2021-02-27 NOTE — Progress Notes (Addendum)
Advanced Heart Failure VAD Team Note  PCP-Cardiologist: Dr. Haroldine Laws   Subjective:    1/23: Admitted for recurrent DL infection - WCx + staph aureus. BCx NGTD. Abd CT pending  1/23: Transfused 1uRBC Hgb 7.0>>8.4  Afebrile. WBC 4.8. On Vanc and cefepime.   INR 3.0>>2.3   Denies melena/ hematochezia. No abdominal pain, subjective fever/chills. No dyspnea   LVAD INTERROGATION:  HeartMate III LVAD:   Flow 3.5 liters/min, speed 5400, power 3.8, PI 4.6 10 PI events, personally reviewed.    Objective:    Vital Signs:   Temp:  [98 F (36.7 C)-98.9 F (37.2 C)] 98.7 F (37.1 C) (01/24 0753) Pulse Rate:  [72-105] 90 (01/24 0808) Resp:  [12-27] 16 (01/24 0808) BP: (80-109)/(64-87) 80/69 (01/24 0808) SpO2:  [95 %-100 %] 95 % (01/24 0808) Weight:  [50.8 kg-55 kg] 50.8 kg (01/24 0500) Last BM Date: 02/27/21 Mean arterial Pressure 70s-80s   Intake/Output:   Intake/Output Summary (Last 24 hours) at 02/27/2021 1054 Last data filed at 02/27/2021 0600 Gross per 24 hour  Intake 1393.27 ml  Output 300 ml  Net 1093.27 ml     Physical Exam    General:  Well appearing. No resp difficulty HEENT: normal Neck: supple. JVP 6 cm . Carotids 2+ bilat; no bruits. No lymphadenopathy or thyromegaly appreciated. Cor: Mechanical heart sounds with LVAD hum present. Lungs: clear Abdomen: soft, nontender, nondistended. No hepatosplenomegaly. No bruits or masses. Good bowel sounds. Driveline: C/D/I; securement device intact and driveline incorporated, bandaged  Extremities: no cyanosis, clubbing, rash, edema Neuro: alert & orientedx3, cranial nerves grossly intact. moves all 4 extremities w/o difficulty. Affect pleasant   Telemetry   NSR 80s   EKG   N/A   Labs   Basic Metabolic Panel: Recent Labs  Lab 02/26/21 1414 02/27/21 0552  NA 138 134*  K 3.4* 3.5  CL 105 103  CO2 21* 23  GLUCOSE 86 89  BUN 6 7  CREATININE 0.76 0.78  CALCIUM 8.6* 8.6*  MG 1.9  --     Liver Function  Tests: Recent Labs  Lab 02/26/21 1414  AST 45*  ALT 25  ALKPHOS 59  BILITOT 0.4  PROT 7.4  ALBUMIN 3.8   No results for input(s): LIPASE, AMYLASE in the last 168 hours. No results for input(s): AMMONIA in the last 168 hours.  CBC: Recent Labs  Lab 02/26/21 1414 02/27/21 0552  WBC 5.4 4.8  HGB 7.0* 8.4*  HCT 21.8* 25.4*  MCV 94.4 92.0  PLT 173 146*    INR: Recent Labs  Lab 02/26/21 1414 02/27/21 0552  INR 3.0* 2.3*    Other results: EKG:    Imaging   DG Chest 2 View  Result Date: 02/26/2021 CLINICAL DATA:  CHF EXAM: CHEST - 2 VIEW COMPARISON:  03/15/2019 FINDINGS: Post sternotomy changes. LVAD grossly similar in position. No acute airspace disease, pleural effusion, or pneumothorax. Normal cardiomediastinal silhouette. IMPRESSION: No active cardiopulmonary disease. Electronically Signed   By: Donavan Foil M.D.   On: 02/26/2021 21:39     Medications:     Scheduled Medications:  sildenafil  20 mg Oral TID    Infusions:  ceFEPime (MAXIPIME) IV Stopped (02/27/21 0548)   vancomycin Stopped (02/27/21 0438)    PRN Medications: acetaminophen   Patient Profile   36 y/o w/ chronic systolic heart failure, NICM, s/p MH3 LVAD w/ prior history of MSSA DL infection, being admitted for recurrent DL infection.   Assessment/Plan:    1. Recurrent DL Infection  -  previous MSSA DL infection 3/22, treated w/ surgical debridement and IV abx - Now w/ recurrent infection. Wound Cx  + staph aureus. BCx NGTD  - CT of C/A/P pending  - Continue Vanc + cefepime - Plan wound debridement + wound vac on 1/27 per Dr. Prescott Gum - Hold Coumadin, INR 2.3 today. - Consult ID    2. Chronic systolic HF due to severe NICM - Echo EF 10% with biventricular failure - HM-3 VAD implant 02/26/19 - Echo 7/21 EF 40-45% - Stable NYHA I. Volume status looks good.  - Continue VAD speed at 5400 - Continue sildenafil - Has been off carvedilol  - MAPs ok    3. VAD - Admit 12/21 with  multiple low flow alarms. Rising LDH and decreasing PLTs. Suspected outflow graft kink occlusion. Patient placed on his left side and resolved.  - VAD interrogated personally. Parameters stable. - Treating for DL infection per above  - Daily dressing changes  - Holding Coumadin for wound debridement, INR 2.3. No heparin yet.  -  LDH 154    4. HTN - MAPs 70s-90s  - Continue home regimen   5. Anemia - Admit Hgb 7.0 - Transfused 1 u RBC 1/23 - Hgb 8.2 today  - Denies abnormal bleeding - Follow H/H   6. Hypokalemia - 3.4>>3.5 - Supp w/ KCl 40 mEq x 1  I reviewed the LVAD parameters from today, and compared the results to the patient's prior recorded data.  No programming changes were made.  The LVAD is functioning within specified parameters.  The patient performs LVAD self-test daily.  LVAD interrogation was negative for any significant power changes, alarms or PI events/speed drops.  LVAD equipment check completed and is in good working order.  Back-up equipment present.   LVAD education done on emergency procedures and precautions and reviewed exit site care.  Length of Stay: 1  Nelida Gores 02/27/2021, 10:54 AM  VAD Team --- VAD ISSUES ONLY--- Pager [phone] (7am - 7am)  Advanced Heart Failure Team  Pager [phone] (M-F; 7a - 5p)  Please contact [facility] for night-coverage after hours (5p -7a ) and weekends on [email or web address]   Patient seen and examined with the above-signed Advanced Practice Provider and/or Housestaff. I personally reviewed laboratory data, imaging studies and relevant notes. I independently examined the patient and formulated the important aspects of the plan. I have edited the note to reflect any of my changes or salient points. I have personally discussed the plan with the patient and/or family.  Remains afebrile on broad spectrum IV abx. CT pending. MAPs stable. Warfarin being held. INR 2.3.   General:  NAD. Weak appearing HEENT: normal  Neck:  supple. JVP not elevated.  Carotids 2+ bilat; no bruits. No lymphadenopathy or thryomegaly appreciated. Cor: LVAD hum.  Lungs: Clear. Abdomen: soft, nontender, non-distended. No hepatosplenomegaly. No bruits or masses. Good bowel sounds. Driveline site covered with dressing.  Anchor in place.  Extremities: no cyanosis, clubbing, rash. Warm no edema  Neuro: alert & oriented x 3. No focal deficits. Moves all 4 without problem   Continue broad spectrum IV abx. CT today. D/w Dr. PVT to OR 1/27 for debridement and wound vac. Follow cultures. VAD interrogated personally. Parameters stable. Hold warfarin. Heparin when INR < 2.0   Glori Bickers, MD  11:45 AM

## 2021-02-27 NOTE — Consult Note (Signed)
[facility]    Date of Admission:  02/26/2021     Total days of antibiotics 1   Vancomycin 1/23 >>   Cefepime 1/23 >> 1/24          Reason for Consult: Destination LVAD Drive line infection     Referring Provider: Bensimhon  Primary Care Provider: Nonah Mattes, MD   Assessment: Jon Martin is a 36 y.o. male with destination HM3 LVAD admitted for drive line infection for IV antibiotics and consideration of surgical management. On exam he has 3 areas that are draining purulent material. He does not have any significant tenderness with palpation over the dressing along his tunneled line. No systemic signs of illness. Will follow blood cultures. With staph aureus growing will narrow to vancomycin. Likely same MSSA as before (he is not colonized with MRSA from PCR) but await susceptibility to narrow further.   Drive line infection may be too chronic to consider antibiotic beads in hopes of a cure of this infection. Likely has tunneling between multiple sites. Hopeful for surgical control to where we can get him to a point where we can chronically suppress the infection long-term given second episode.    Plan: Continue vancomycin  Stop cefepime  Follow for pending micro (blood and wound)  Follow for surgery findings (OR planned 1/27).      Principal Problem:   Infection associated with driveline of left ventricular assist device (LVAD) (HCC)    sildenafil  20 mg Oral TID    HPI: Jon Martin is a 36 y.o. male admitted from VAD clinic for drive line infection.   [name] has a history of NICM with HM3 destination LVAD placed 02/26/2019.  History of MSSA driveline infection s/p I&D in March 2022. Treated with IV cefazolin until follow up in May when I continued him on doxycycline until another follow up, however he did not come back to clinic due to transportation challenges (lives in [place]). Seen in LVAD clinic in November 2022 - had had a  new baby girl at that time and was doing well though described mild superficial drainage at that time but no antibiotics needed and instructed for dressing care and maintenance.   Seen yesterday 1/23 in VAD clinic for 42m follow up - he was feeling well and had no systemic symptoms of illness. No pain overlying his drive line but had more drainage noted at the site. [name] states he is not sure when this drainage started. He did drop his equipment with drive line trauma the day prior to. He had an abdominal binder on covering some gauze and tape as he ran out of drive line dressing kits. Drainage was described to be "leaking through the bottom of the gauze bandage." He states he cleans his site as instructed but he tells [place] today he "does not really look at it when he does his dressing change." Cannot quite state when the drainage started this time.   Cultures of the drive line were taken at both the exit site and the previous incision overlying tunneled line and was admitted for IV antibiotics.  Dr. Donata Clay has seen and planning on debridement when INR drops (Friday of this week?). He continues to have no pain overlying any portion of the tunneled line. One gram stain with GPR and GPCs. Cultures from both sites growing staph aureus.    Review of Systems: Review of Systems  Constitutional:  Negative for  chills, fever, malaise/fatigue and weight loss.  HENT:  Negative for sore throat.   Respiratory:  Negative for cough, sputum production and shortness of breath.   Gastrointestinal:  Negative for abdominal pain, diarrhea and vomiting.       Draining wound to abdomen   Musculoskeletal:  Negative for joint pain, myalgias and neck pain.  Skin:  Negative for rash.  Neurological:  Negative for headaches.  Psychiatric/Behavioral:  Negative for depression and substance abuse. The patient is not nervous/anxious.     Past Medical History:  Diagnosis Date   Dilated cardiomyopathy (HCC)    Dilated  cardiomyopathy (HCC) 02/2019   Polysubstance abuse (HCC)     Social History   Tobacco Use   Smoking status: Former    Types: Cigarettes   Smokeless tobacco: Never  Vaping Use   Vaping Use: Some days  Substance Use Topics   Alcohol use: Yes   Drug use: Not Currently    Comment: Hx of cocaine use    Family History  Problem Relation Age of Onset   CAD Father    No Known Allergies  OBJECTIVE: Blood pressure 97/83, pulse 88, temperature 98.7 F (37.1 C), temperature source Oral, resp. rate 19, height 5\' 6"  (1.676 m), weight 50.8 kg, SpO2 99 %.  Physical Exam Vitals reviewed.  Constitutional:      Appearance: He is well-developed.     Comments: Resting comfortably in bed. No distress.   HENT:     Mouth/Throat:     Dentition: Normal dentition. No dental abscesses.  Cardiovascular:     Rate and Rhythm: Normal rate and regular rhythm.     Heart sounds: Normal heart sounds.  Pulmonary:     Effort: Pulmonary effort is normal.     Breath sounds: Normal breath sounds.  Abdominal:     General: There is no distension.     Palpations: Abdomen is soft.     Tenderness: There is no abdominal tenderness.     Comments: Left lower quadrant wound with purulent material draining from exit site. Skin is not incorporated around drive line. 2 small areas that are open and draining along tunneled lead. No induration, erythema or significant swelling.   Lymphadenopathy:     Cervical: No cervical adenopathy.  Skin:    General: Skin is warm and dry.     Findings: No rash.  Neurological:     Mental Status: He is alert and oriented to person, place, and time.  Psychiatric:        Judgment: Judgment normal.    Lab Results Lab Results  Component Value Date   WBC 4.8 02/27/2021   HGB 8.4 (L) 02/27/2021   HCT 25.4 (L) 02/27/2021   MCV 92.0 02/27/2021   PLT 146 (L) 02/27/2021    Lab Results  Component Value Date   CREATININE 0.78 02/27/2021   BUN 7 02/27/2021   NA 134 (L) 02/27/2021    K 3.5 02/27/2021   CL 103 02/27/2021   CO2 23 02/27/2021    Lab Results  Component Value Date   ALT 25 02/26/2021   AST 45 (H) 02/26/2021   ALKPHOS 59 02/26/2021   BILITOT 0.4 02/26/2021     Microbiology: Recent Results (from the past 240 hour(s))  Aerobic Culture w Gram Stain (superficial specimen)     Status: None (Preliminary result)   Collection Time: 02/26/21 11:35 AM   Specimen: Abdomen; Wound  Result Value Ref Range Status   Specimen Description ABDOMEN  Final  Special Requests LVAD DRIVE LINE  Final   Gram Stain   Final    FEW WBC PRESENT, PREDOMINANTLY MONONUCLEAR FEW GRAM POSITIVE COCCI RARE GRAM POSITIVE RODS    Culture   Final    FEW STAPHYLOCOCCUS AUREUS CULTURE REINCUBATED FOR BETTER GROWTH Performed at [facility], [address] [place], [place] [address]    Report Status PENDING  Incomplete  Aerobic Culture w Gram Stain (superficial specimen)     Status: None (Preliminary result)   Collection Time: 02/26/21 11:35 AM   Specimen: Abscess; Wound  Result Value Ref Range Status   Specimen Description ABSCESS  Final   Special Requests ABDOMEN  Final   Gram Stain NO WBC SEEN NO ORGANISMS SEEN   Final   Culture   Final    FEW STAPHYLOCOCCUS AUREUS SUSCEPTIBILITIES TO FOLLOW Performed at [facility], [address] [place], [place] [address]    Report Status PENDING  Incomplete  Culture, blood (single)     Status: None (Preliminary result)   Collection Time: 02/26/21 12:00 PM   Specimen: BLOOD RIGHT HAND  Result Value Ref Range Status   Specimen Description BLOOD RIGHT HAND  Final   Special Requests   Final    BOTTLES DRAWN AEROBIC AND ANAEROBIC Blood Culture results may not be optimal due to an inadequate volume of blood received in culture bottles   Culture   Final    NO GROWTH < 24 HOURS Performed at [facility], [address], [place], [place] [address]    Report Status PENDING  Incomplete  Culture, blood (single)      Status: None (Preliminary result)   Collection Time: 02/26/21 12:20 PM   Specimen: BLOOD  Result Value Ref Range Status   Specimen Description BLOOD LEFT ANTECUBITAL  Final   Special Requests   Final    BOTTLES DRAWN AEROBIC AND ANAEROBIC Blood Culture results may not be optimal due to an inadequate volume of blood received in culture bottles   Culture   Final    NO GROWTH < 24 HOURS Performed at [facility], [address] [place], [place] [address]    Report Status PENDING  Incomplete  MRSA Next Gen by PCR, Nasal     Status: None   Collection Time: 02/26/21  2:52 PM   Specimen: Nasal Mucosa; Nasal Swab  Result Value Ref Range Status   MRSA by PCR Next Gen NOT DETECTED NOT DETECTED Final    Comment: (NOTE) The GeneXpert MRSA Assay (FDA approved for NASAL specimens only), is one component of a comprehensive MRSA colonization surveillance program. It is not intended to diagnose MRSA infection nor to guide or monitor treatment for MRSA infections. Test performance is not FDA approved in patients less than 1 years old. Performed at [facility], [address], [place], [place] [address]   Resp Panel by RT-PCR (Flu A&B, Covid) Nasal Mucosa     Status: None   Collection Time: 02/26/21  3:01 PM   Specimen: Nasal Mucosa; Nasopharyngeal(NP) swabs in vial transport medium  Result Value Ref Range Status   SARS Coronavirus 2 by RT PCR NEGATIVE NEGATIVE Final    Comment: (NOTE) SARS-CoV-2 target nucleic acids are NOT DETECTED.  The SARS-CoV-2 RNA is generally detectable in upper respiratory specimens during the acute phase of infection. The lowest concentration of SARS-CoV-2 viral copies this assay can detect is 138 copies/mL. A negative result does not preclude SARS-Cov-2 infection and  should not be used as the sole basis for treatment or other patient management decisions. A negative result may occur with  improper specimen collection/handling, submission of specimen  other than nasopharyngeal swab, presence of viral mutation(s) within the areas targeted by this assay, and inadequate number of viral copies(<138 copies/mL). A negative result must be combined with clinical observations, patient history, and epidemiological information. The expected result is Negative.  Fact Sheet for Patients:  BloggerCourse.com  Fact Sheet for Healthcare Providers:  SeriousBroker.it  This test is no t yet approved or cleared by the [place] FDA and  has been authorized for detection and/or diagnosis of SARS-CoV-2 by FDA under an Emergency Use Authorization (EUA). This EUA will remain  in effect (meaning this test can be used) for the duration of the COVID-19 declaration under Section 564(b)(1) of the Act, 21 U.S.C.section 360bbb-3(b)(1), unless the authorization is terminated  or revoked sooner.       Influenza A by PCR NEGATIVE NEGATIVE Final   Influenza B by PCR NEGATIVE NEGATIVE Final    Comment: (NOTE) The Xpert Xpress SARS-CoV-2/FLU/RSV plus assay is intended as an aid in the diagnosis of influenza from Nasopharyngeal swab specimens and should not be used as a sole basis for treatment. Nasal washings and aspirates are unacceptable for Xpert Xpress SARS-CoV-2/FLU/RSV testing.  Fact Sheet for Patients: BloggerCourse.com  Fact Sheet for Healthcare Providers: SeriousBroker.it  This test is not yet approved or cleared by the [place] FDA and has been authorized for detection and/or diagnosis of SARS-CoV-2 by FDA under an Emergency Use Authorization (EUA). This EUA will remain in effect (meaning this test can be used) for the duration of the COVID-19 declaration under Section 564(b)(1) of the Act, 21 U.S.C. section 360bbb-3(b)(1), unless the authorization is terminated or revoked.  Performed at [facility], [address] [place],  [place] [address]     Rexene Alberts, MSN, NP-C [facility] Pager: [phone]  02/27/2021 4:07 PM

## 2021-02-27 NOTE — Plan of Care (Signed)
°  Problem: Education: °Goal: Knowledge of General Education information will improve °Description: Including pain rating scale, medication(s)/side effects and non-pharmacologic comfort measures °Outcome: Progressing °  °Problem: Health Behavior/Discharge Planning: °Goal: Ability to manage health-related needs will improve °Outcome: Progressing °  °Problem: Clinical Measurements: °Goal: Ability to maintain clinical measurements within normal limits will improve °Outcome: Progressing °Goal: Respiratory complications will improve °Outcome: Progressing °Goal: Cardiovascular complication will be avoided °Outcome: Progressing °  °Problem: Activity: °Goal: Risk for activity intolerance will decrease °Outcome: Progressing °  °Problem: Nutrition: °Goal: Adequate nutrition will be maintained °Outcome: Progressing °  °

## 2021-02-28 DIAGNOSIS — T827XXA Infection and inflammatory reaction due to other cardiac and vascular devices, implants and grafts, initial encounter: Secondary | ICD-10-CM

## 2021-02-28 DIAGNOSIS — I5022 Chronic systolic (congestive) heart failure: Secondary | ICD-10-CM | POA: Diagnosis not present

## 2021-02-28 DIAGNOSIS — I428 Other cardiomyopathies: Secondary | ICD-10-CM | POA: Diagnosis not present

## 2021-02-28 DIAGNOSIS — I10 Essential (primary) hypertension: Secondary | ICD-10-CM | POA: Diagnosis not present

## 2021-02-28 LAB — BASIC METABOLIC PANEL
Anion gap: 9 (ref 5–15)
BUN: <5 mg/dL — ABNORMAL LOW (ref 6–20)
CO2: 21 mmol/L — ABNORMAL LOW (ref 22–32)
Calcium: 9.1 mg/dL (ref 8.9–10.3)
Chloride: 101 mmol/L (ref 98–111)
Creatinine, Ser: 0.76 mg/dL (ref 0.61–1.24)
GFR, Estimated: >60 mL/min (ref >60)
Glucose, Bld: 101 mg/dL — ABNORMAL HIGH (ref 70–99)
Potassium: 3.6 mmol/L (ref 3.5–5.1)
Sodium: 131 mmol/L — ABNORMAL LOW (ref 135–145)

## 2021-02-28 LAB — CBC
HCT: 28.4 % — ABNORMAL LOW (ref 39.0–52.0)
Hemoglobin: 9.3 g/dL — ABNORMAL LOW (ref 13.0–17.0)
MCH: 30 pg (ref 26.0–34.0)
MCHC: 32.7 g/dL (ref 30.0–36.0)
MCV: 91.6 fL (ref 80.0–100.0)
Platelets: 152 10*3/uL (ref 150–400)
RBC: 3.1 MIL/uL — ABNORMAL LOW (ref 4.22–5.81)
RDW: 17.8 % — ABNORMAL HIGH (ref 11.5–15.5)
WBC: 4.6 10*3/uL (ref 4.0–10.5)
nRBC: 0 % (ref 0.0–0.2)

## 2021-02-28 LAB — AEROBIC CULTURE W GRAM STAIN (SUPERFICIAL SPECIMEN): Gram Stain: NONE SEEN

## 2021-02-28 LAB — PROTIME-INR
INR: 1.4 — ABNORMAL HIGH (ref 0.8–1.2)
Prothrombin Time: 17 seconds — ABNORMAL HIGH (ref 11.4–15.2)

## 2021-02-28 LAB — HEPARIN LEVEL (UNFRACTIONATED): Heparin Unfractionated: <0.1 IU/mL — ABNORMAL LOW (ref 0.30–0.70)

## 2021-02-28 LAB — LACTATE DEHYDROGENASE: LDH: 145 U/L (ref 98–192)

## 2021-02-28 MED ORDER — ONDANSETRON HCL 4 MG/2ML IJ SOLN: 4.0000 mg | Freq: Three times a day (TID) | INTRAMUSCULAR | Status: DC | PRN

## 2021-02-28 MED ORDER — POTASSIUM CHLORIDE CRYS ER 20 MEQ PO TBCR
40.0000 meq | EXTENDED_RELEASE_TABLET | Freq: Once | ORAL | Status: AC
  Administered 2021-02-28: 10:00:00 40 meq via ORAL
  Filled 2021-02-28: qty 2

## 2021-02-28 MED ORDER — CEFAZOLIN SODIUM-DEXTROSE 2-4 GM/100ML-% IV SOLN
2.0000 g | Freq: Three times a day (TID) | INTRAVENOUS | Status: DC
  Administered 2021-02-28 – 2021-03-16 (×47): 2 g via INTRAVENOUS
  Filled 2021-02-28 (×53): qty 100

## 2021-02-28 MED ORDER — HEPARIN (PORCINE) 25000 UT/250ML-% IV SOLN
400.0000 [IU]/h | INTRAVENOUS | Status: DC
  Administered 2021-02-28 – 2021-03-05 (×2): 400 [IU]/h via INTRAVENOUS
  Filled 2021-02-28 (×3): qty 250

## 2021-02-28 NOTE — Progress Notes (Addendum)
Advanced Heart Failure VAD Team Note  PCP-Cardiologist: Dr. Haroldine Laws   Subjective:    1/23: Admitted for recurrent DL infection - WCx + staph aureus. BCx NGTD. Abd CT pending  1/23: Transfused 1uRBC Hgb 7.0>>8.4  CT of C/A/P w/ stranding in the anterior abdominal wall slightly above the umbilicus adjacent to lead extending to the left ventricular assist device, suggestive of possible cellulitis. No loculated fluid collection.   ID following, abx narrowed to just vanc. AF. WBC 46. Blood cx NGTD.   Hgb trending up, 7.0>>8.4>>9.3  INR 1.4. Heparin ordered   Feels well today. No complaints. Denies abdominal pain. No dyspnea or chest pain.   LVAD INTERROGATION:  HeartMate III LVAD:   Flow 3.4 liters/min, speed 5450, power 3.8, PI 6.5 10 PI events, personally reviewed.    Objective:    Vital Signs:   Temp:  [98.2 F (36.8 C)-99.2 F (37.3 C)] 98.5 F (36.9 C) (01/25 0728) Pulse Rate:  [82-99] 87 (01/25 0728) Resp:  [12-19] 19 (01/25 0728) BP: (87-104)/(70-83) 104/73 (01/25 0728) SpO2:  [94 %-100 %] 100 % (01/25 0728) Weight:  [52.3 kg] 52.3 kg (01/25 0513) Last BM Date: 02/27/21 Mean arterial Pressure 80s  Intake/Output:   Intake/Output Summary (Last 24 hours) at 02/28/2021 0831 Last data filed at 02/28/2021 0500 Gross per 24 hour  Intake 860 ml  Output 800 ml  Net 60 ml     Physical Exam    General:  Well appearing young male, laying in bed. No resp difficulty HEENT: normal Neck: supple. JVD not elevated . Carotids 2+ bilat; no bruits. No lymphadenopathy or thyromegaly appreciated. Cor: Mechanical heart sounds with LVAD hum present. Lungs: CTAB  Abdomen: soft, nontender, nondistended. No hepatosplenomegaly. No bruits or masses. Good bowel sounds. Driveline: C/D/I; securement device intact and driveline incorporated, bandaged  Extremities: no cyanosis, clubbing, rash, edema Neuro: alert & orientedx3, cranial nerves grossly intact. moves all 4 extremities w/o  difficulty. Affect pleasant   Telemetry   NSR 80s-90s   EKG   N/A   Labs   Basic Metabolic Panel: Recent Labs  Lab 02/26/21 1414 02/27/21 0552 02/28/21 0124  NA 138 134* 131*  K 3.4* 3.5 3.6  CL 105 103 101  CO2 21* 23 21*  GLUCOSE 86 89 101*  BUN 6 7 <5*  CREATININE 0.76 0.78 0.76  CALCIUM 8.6* 8.6* 9.1  MG 1.9  --   --     Liver Function Tests: Recent Labs  Lab 02/26/21 1414  AST 45*  ALT 25  ALKPHOS 59  BILITOT 0.4  PROT 7.4  ALBUMIN 3.8   No results for input(s): LIPASE, AMYLASE in the last 168 hours. No results for input(s): AMMONIA in the last 168 hours.  CBC: Recent Labs  Lab 02/26/21 1414 02/27/21 0552 02/28/21 0124  WBC 5.4 4.8 4.6  HGB 7.0* 8.4* 9.3*  HCT 21.8* 25.4* 28.4*  MCV 94.4 92.0 91.6  PLT 173 146* 152    INR: Recent Labs  Lab 02/26/21 1414 02/27/21 0552 02/28/21 0124  INR 3.0* 2.3* 1.4*    Other results: EKG:    Imaging   DG Chest 2 View  Result Date: 02/26/2021 CLINICAL DATA:  CHF EXAM: CHEST - 2 VIEW COMPARISON:  03/15/2019 FINDINGS: Post sternotomy changes. LVAD grossly similar in position. No acute airspace disease, pleural effusion, or pneumothorax. Normal cardiomediastinal silhouette. IMPRESSION: No active cardiopulmonary disease. Electronically Signed   By: Donavan Foil M.D.   On: 02/26/2021 21:39   CT CHEST ABDOMEN  PELVIS W CONTRAST  Result Date: 02/27/2021 CLINICAL DATA:  Sepsis EXAM: CT CHEST, ABDOMEN, AND PELVIS WITH CONTRAST TECHNIQUE: Multidetector CT imaging of the chest, abdomen and pelvis was performed following the standard protocol during bolus administration of intravenous contrast. RADIATION DOSE REDUCTION: This exam was performed according to the departmental dose-optimization program which includes automated exposure control, adjustment of the mA and/or kV according to patient size and/or use of iterative reconstruction technique. CONTRAST:  18mL OMNIPAQUE IOHEXOL 350 MG/ML SOLN COMPARISON:   Previous studies including the examination of 05/12/2020 FINDINGS: CT CHEST FINDINGS Cardiovascular: There is left ventricular assist pump adjacent to the apex of the left ventricle. Mediastinum/Nodes: No significant lymphadenopathy seen in the mediastinum. There are few slightly enlarged lymph nodes in both axillary regions largest measuring 12 mm in short axis. Lungs/Pleura: There is no focal pulmonary consolidation. There are no discrete noncalcified lung nodules. Beam hardening artifacts caused by left ventricular assist device limit evaluation of adjacent lung fields. There is no pleural effusion or pneumothorax. CT ABDOMEN PELVIS FINDINGS Hepatobiliary: Liver measures 15.2 cm in length. No focal abnormality is seen. Gallbladder is unremarkable. Pancreas: No focal abnormality is seen. Spleen: Unremarkable. Adrenals/Urinary Tract: Adrenals are not enlarged. There is no hydronephrosis. There are no renal or ureteral stones. Urinary bladder is not distended. Stomach/Bowel: There is mild diffuse wall thickening in the fundus of the stomach. Small bowel loops are not dilated. Appendix is not dilated. There is no significant wall thickening in colon. There is no pericolic stranding. Vascular/Lymphatic: Vascular structures are unremarkable. No new significant lymphadenopathy seen. Reproductive: Unremarkable. Other: There is no ascites or pneumoperitoneum. There is stranding in the fat planes adjacent to a lead attached to the left ventricular assist device slightly superior to the umbilicus. There is skin thickening. There is tiny pocket of air in the skin. There is no demonstrable thick-walled loculated fluid collection. Musculoskeletal: Metallic sutures are seen in the sternum. Bony structures are otherwise unremarkable. IMPRESSION: There is skin thickening and subcutaneous stranding in the anterior abdominal wall slightly above the umbilicus adjacent to lead extending to the left ventricular assist device. Findings  suggest possible cellulitis. There is no demonstrable loculated fluid collection in this region. There is no focal pulmonary consolidation. There is no pleural effusion. No significant lymphadenopathy seen in mediastinum. There is no evidence of intestinal obstruction or pneumoperitoneum. Appendix is not dilated. There is no hydronephrosis. There are no abnormal loculated fluid collections in the abdomen and pelvis. There is diffuse wall thickening in the fundus of the stomach which may be due to incomplete distention or gastritis or neoplastic process. Electronically Signed   By: Elmer Picker M.D.   On: 02/27/2021 15:50   ECHOCARDIOGRAM LIMITED  Result Date: 02/27/2021    ECHOCARDIOGRAM LIMITED REPORT   Patient Name:   Jon Martin Date of Exam: 02/27/2021 Medical Rec #:  [id number]       Height:       66.0 in Accession #:    [id number]      Weight:       112.0 lb Date of Birth:  1985/02/26        BSA:          1.563 m Patient Age:    35 years        BP:           97/83 mmHg Patient Gender: M               HR:  99 bpm. Exam Location:  Inpatient Procedure: Limited Color Doppler, Cardiac Doppler and Limited Echo Indications:    infection. LVAD.  History:        Patient has prior history of Echocardiogram examinations, most                 recent 01/14/2020. Cardiomyopathy.  Sonographer:    Johny Chess RDCS Referring Phys: [phone] PETER VANTRIGT  Sonographer Comments: Technically difficult study due to poor echo windows. IMPRESSIONS  1. Limited study for LVAD Assessment.     Parameters not entered, study assumes most recent documented LVAD parameters:     Flow 3.5 liters/min, speed 5400, power 3.8, PI 4.6     Inflow cannula visualized without Doppler interrogation.     Outflow cannular visualized without Doppler interrogation. Left ventricular ejection fraction, by estimation, is 40 to 45%. The left ventricle has mildly decreased function. The left ventricle demonstrates global hypokinesis.  Neutral septum.  2. Right ventricular systolic function is mildly reduced with basal function largely preserved. The right ventricular size is severely enlarged.  3. The mitral valve is normal in structure. Mild to moderate mitral valve regurgitation. No evidence of mitral stenosis.  4. The aortic valve is tricuspid. Aortic valve regurgitation is not visualized. Aortic valve opens consistently. FINDINGS  Left Ventricle: Limited study for LVAD Assessment. Parameters not entered, study assumes most recent documented LVAD parameters: Flow 3.5 liters/min, speed 5400, power 3.8, PI 4.6 Inflow cannula visualized without Doppler interrogation. Outflow cannular visualized without Doppler interrogation. Left ventricular ejection fraction, by estimation, is 40 to 45%. The left ventricle has mildly decreased function. The left ventricle demonstrates global hypokinesis. Right Ventricle: The right ventricular size is severely enlarged. No increase in right ventricular wall thickness. Right ventricular systolic function is mildly reduced. Mitral Valve: The mitral valve is normal in structure. Mild to moderate mitral valve regurgitation. No evidence of mitral valve stenosis. Tricuspid Valve: The tricuspid valve is normal in structure. Tricuspid valve regurgitation is mild . No evidence of tricuspid stenosis. Aortic Valve: The aortic valve is tricuspid. Aortic valve regurgitation is not visualized. Pulmonic Valve: The pulmonic valve was not well visualized. Pulmonic valve regurgitation is not visualized. No evidence of pulmonic stenosis. IVC IVC diam: 1.10 cm TRICUSPID VALVE TR Peak grad:   17.5 mmHg TR Vmax:        209.00 cm/s Rudean Haskell MD Electronically signed by Rudean Haskell MD Signature Date/Time: 02/27/2021/4:32:35 PM    Final      Medications:     Scheduled Medications:  sildenafil  20 mg Oral TID    Infusions:  heparin     vancomycin Stopped (02/28/21 0447)    PRN  Medications: acetaminophen   Patient Profile   36 y/o w/ chronic systolic heart failure, NICM, s/p MH3 LVAD w/ prior history of MSSA DL infection, being admitted for recurrent DL infection.   Assessment/Plan:    1. Recurrent DL Infection  - previous MSSA DL infection 3/22, treated w/ surgical debridement and IV abx - Now w/ recurrent infection. Wound Cx  + staph aureus. BCx NGTD  - CT of C/A/P w/ stranding in the anterior abdominal wall slightly above the umbilicus adjacent to lead extending to the left ventricular assist device, suggestive of possible cellulitis. No loculated fluid collection.  - Abx narrowed to Vanc - Plan wound debridement + wound vac on 1/27 per Dr. Prescott Gum - Hold Coumadin, INR 1.4 today. Start heparin gtt  - ID following, appreciate their assistance  2. Chronic systolic HF due to severe NICM - Echo EF 10% with biventricular failure - HM-3 VAD implant 02/26/19 - Echo 7/21 EF 40-45% - Stable NYHA I. Volume status looks good.  - Continue VAD speed at 5400 - Continue sildenafil - Has been off carvedilol  - MAPs ok    3. VAD - Admit 12/21 with multiple low flow alarms. Rising LDH and decreasing PLTs. Suspected outflow graft kink occlusion. Patient placed on his left side and resolved.  - VAD interrogated personally. Parameters stable. - Treating for DL infection per above  - Daily dressing changes  - Holding Coumadin for wound debridement, INR 1.4. Start heparin gtt  -  LDH 145   4. HTN - MAPs 80s - Continue home regimen   5. Anemia - Admit Hgb 7.0 - Transfused 1 u RBC 1/23 - Hgb 9.3 today  - Denies abnormal bleeding - Follow H/H   6. Hypokalemia - 3.4>>3.5>3.6 today   I reviewed the LVAD parameters from today, and compared the results to the patient's prior recorded data.  No programming changes were made.  The LVAD is functioning within specified parameters.  The patient performs LVAD self-test daily.  LVAD interrogation was negative for any  significant power changes, alarms or PI events/speed drops.  LVAD equipment check completed and is in good working order.  Back-up equipment present.   LVAD education done on emergency procedures and precautions and reviewed exit site care.  Length of Stay: 2  Lyda Jester, PA-C 02/28/2021, 8:31 AM  VAD Team --- VAD ISSUES ONLY--- Pager [phone] (7am - 7am)  Advanced Heart Failure Team  Pager [phone] (M-F; 7a - 5p)  Please contact [facility] for night-coverage after hours (5p -7a ) and weekends on [email or web address]   Patient seen and examined with the above-signed Advanced Practice Provider and/or Housestaff. I personally reviewed laboratory data, imaging studies and relevant notes. I independently examined the patient and formulated the important aspects of the plan. I have edited the note to reflect any of my changes or salient points. I have personally discussed the plan with the patient and/or family.  Feeling better today. Afebrile on broad spectrum abx. Hgb stable. INR 1.4   VAD interrogated personally. Parameters stable.  General:  NAD.  HEENT: normal  Neck: supple. JVP not elevated.  Carotids 2+ bilat; no bruits. No lymphadenopathy or thryomegaly appreciated. Cor: LVAD hum.  Lungs: Clear. Abdomen:  soft, nontender, non-distended. No hepatosplenomegaly. No bruits or masses. Good bowel sounds. Driveline site dressed Anchor in place.  Extremities: no cyanosis, clubbing, rash. Warm no edema  Neuro: alert & oriented x 3. No focal deficits. Moves all 4 without problem  DL infection stable on broad spectrum abx. No deep abscess on CT. For I&&D and wound vac later this week. Start heparin (low dose). VAD interrogated personally. Parameters stable.   Glori Bickers, MD  11:41 AM

## 2021-02-28 NOTE — TOC Progression Note (Addendum)
Transition of Care [facility]) - Progression Note    Patient Details  Name: Rithvik Walworth MRN: [id number] Date of Birth: [birth year]  Transition of Care [facility]) CM/SW Contact  Fred Hammes, LCSW Phone Number: 02/28/2021, 11:42 AM  Clinical Narrative:    HF CSW received a message from the VAD Coordinator about Mr. Mcalpine needing SNF placement at time of discharge for help with IV Abx and wound vac and provided the contact information for [facility] SNF in [place] where Mr. Mullarkey has previously been before for rehab. CSW to reach out to [facility] and see about bed availability and if they can accommodate Mr. Schlie. CSW will need PT/OT consult to initiate SNF workup.   11:49am - HF CSW spoke with a Otila Kluver the admission's coordinator at [facility] who reported that she does have beds available and to fax over clinicals to [phone]. CSW will be in touch and fax clinicals over.  12:15pm - HF CSW attempted to visit Mr. Fullbright at bedside for introductions but he was asleep and unable to wake up and CSW will be back later and try another time.  CSW will continue to follow throughout discharge.   Expected Discharge Plan: [place] Barriers to Discharge: Continued Medical Work up  Expected Discharge Plan and Services Expected Discharge Plan: [place]   Discharge Planning Services: CM Consult Post Acute Care Choice: [place] arrangements for the past 2 months: Single Family Home                                       Social Determinants of Health (SDOH) Interventions    Readmission Risk Interventions No flowsheet data found.  Jabir Dahlem, MSW, [place] Heart Failure Social Worker

## 2021-02-28 NOTE — Progress Notes (Signed)
LVAD Coordinator Rounding Note:  Admitted 02/26/21 due to Dr. Clayborne Dana service for VAD drive line infection.   HM III LVAD implanted on 02/26/19 by Dr. Darcey Nora under Destination Therapy criteria.  Patient laying in bed watching TV. Denies complaints. Per Dr Prescott Gum will plan for drive line debridement on Friday in the OR.   Wound cultures with growth of staph aureus. Will obtain cultures on OR on Friday. Antibiotic management per ID team: Vancomycin 1000 mg IV every 12 hours.   Pt may need placement at SNF in [place], [place] at discharge if discharged with wound vac and/or IV antibiotics. Previously he has been placed at [facility]. [facility] main # [phone]. Admission coordinator # [phone]. Fax # [phone]. Numbers provided to inpatient case manager and CSW.   Vital signs: Temp: 98.5 HR: 80 Doppler Pressure: 80 Automatic BP: 99/78 (83) O2 Sat: 98% RA  Wt: 111.9>115.3  lbs  LVAD interrogation reveals:  Speed: 5400 Flow: 3.6 Power: 3.8 w PI: 5.1 Hct: 20 - DO NOT CHANGE (reminder placed on VAD cart)  Alarms: none Events: 11 PI events so far today  Fixed speed: 5400 Low speed limit: 5100  Drive Line:  Existing VAD dressing removed and site care performed using sterile technique. Drive line exit site cleaned with CHG x 2, and rinsed with sterile saline, and allowed to dry, and abdominal pad/gauze dressing with silver strip re-applied. Exit site partially incorporated with velour fully implanted. Moderate amount of yellow/green thick/slimy/bloody drainage, with foul odor, and rash noted all around site. Site tunnels 4.5 cm. Pt denies fever or chills. Anchor reapplied. Daily dressing changes per bedside RN or VAD coordinator. Next dressing change due 03/01/21.   Abdominal Abcsess: Abdominal wound/abscess x 2 with thick yellow/bloody drainage. Tunneling noted 4 cm from one abscessed area to the other above the umbilicus. Site cleansed with CHG swab x 2, allowed to dry,  and rinsed with sterile saline. Covered with ABD pad. Daily dressing changes per bedside RN or VAD coordinator. Next dressing changer due: 03/01/21.    Labs:  LDH trend: 154>145  INR trend: 2.3>1.4  Hgb trend: 7.0>8.4>9.3  Anticoagulation Plan: -INR Goal: 2.0 - 2.5 -ASA Dose: none  Blood Products:  - 02/26/21>>1 units PCs  Device: N/A  Infection:  02/26/21 Drive line wound culture>> few staph aureus; final  02/26/21 Abdominal abscess culture>> few staph aureus; final  02/26/21 Blood cultures >> no growth 2 days; final pending   Drips:  Heparin 400 units/hr  Plan/Recommendations:  1. Call VAD Coordinator if any VAD equipment or drive line issues. 2. Daily drive line dressing changes per bedside RN or VAD coordinator 3. VAD coordinator will accompany pt to OR on Friday for drive line debridement with Dr Prescott Gum.   Emerson Monte RN [place] Coordinator  Office: [phone]  24/7 Pager: [phone]

## 2021-02-28 NOTE — Progress Notes (Signed)
RN went to room to check on patient. Pt sleeping in bed. Pt immediately sat up in bed and ask for trash can. Pt vomited into trash can and stated he was okay. HR in 130'-160's.  Pt laid back down in bed and proceeded to go back to sleep.    RN check on pt again, resting in bed HR in the 90's - 100's.  LVAD coordinator, Ebony Hail alerted RN that pt had vomited once again.   RN notified Brittainy, PA of patient's status. PA came to bedside and spoke with patient.  No new orders placed.

## 2021-02-28 NOTE — Progress Notes (Signed)
Procedure(s) (LRB): VAD DRIVELINE WOUND DEBRIDEMENT (N/A) APPLICATION OF WOUND VAC (N/A) Subjective: Patient currently resting but had an episode of emesis earlier today CT scan images reviewed showing no discrete abscess but indurated abdominal wall tissue.  With a superficial open wound that is draining patient still needs irrigation debridement and wound VAC.  Fortunately no evidence of proximal extension to the pump.  Echocardiogram performed today reviewed showing no evidence of endocarditis and pulm inflow cannula in good position with moderate RV dysfunction but improved LV dysfunction from the time of implant.  INR is now subtherapeutic and the patient is heparin until the procedure Wound culture grows out MSSA and patient is on IV vancomycin Objective: Vital signs in last 24 hours: Temp:  [98.2 F (36.8 C)-99.2 F (37.3 C)] 98.8 F (37.1 C) (01/25 1703) Pulse Rate:  [82-100] 84 (01/25 1703) Cardiac Rhythm: Normal sinus rhythm (01/25 1656) Resp:  [14-19] 16 (01/25 1703) BP: (84-133)/(62-84) 84/71 (01/25 1703) SpO2:  [94 %-100 %] 98 % (01/25 1703) Weight:  [52.3 kg] 52.3 kg (01/25 0513)  Hemodynamic parameters for last 24 hours: Stable Intake/Output from previous day: 01/24 0701 - 01/25 0700 In: 860 [P.O.:360; IV Piggyback:500] Out: 800 [Urine:800] Intake/Output this shift: Total I/O In: 840 [P.O.:840] Out: 400 [Urine:400]  Superficial open abdominal wound at the driveline tunnel  Lab Results: Recent Labs    02/27/21 0552 02/28/21 0124  WBC 4.8 4.6  HGB 8.4* 9.3*  HCT 25.4* 28.4*  PLT 146* 152   BMET:  Recent Labs    02/27/21 0552 02/28/21 0124  NA 134* 131*  K 3.5 3.6  CL 103 101  CO2 23 21*  GLUCOSE 89 101*  BUN 7 <5*  CREATININE 0.78 0.76  CALCIUM 8.6* 9.1    PT/INR:  Recent Labs    02/28/21 0124  LABPROT 17.0*  INR 1.4*   ABG    Component Value Date/Time   PHART 7.549 (H) 03/02/2019 0358   HCO3 26.5 03/02/2019 0358   TCO2 27  03/02/2019 0358   ACIDBASEDEF 0.5 02/28/2019 1740   O2SAT 62.2 03/15/2019 0335   CBG (last 3)  No results for input(s): GLUCAP in the last 72 hours.  Assessment/Plan: S/P Procedure(s) (LRB): VAD DRIVELINE WOUND DEBRIDEMENT (N/A) APPLICATION OF WOUND VAC (N/A) Plan open debridement irrigation and wound VAC placement in the OR of the abdominal wound.   LOS: 2 days    Lovett Sox 02/28/2021

## 2021-02-28 NOTE — Progress Notes (Addendum)
ANTICOAGULATION CONSULT NOTE  Pharmacy Consult for heparin Indication:  LVAD  No Known Allergies  Patient Measurements: Height: 5\' 6"  (167.6 cm) Weight: 52.3 kg (115 lb 4.8 oz) IBW/kg (Calculated) : 63.8 Heparin Dosing Weight: 52kg  Vital Signs: Temp: 98.5 F (36.9 C) (01/25 0728) Temp Source: Oral (01/25 0728) BP: 104/73 (01/25 0728) Pulse Rate: 87 (01/25 0728)  Labs: Recent Labs    02/26/21 1414 02/27/21 0552 02/28/21 0124  HGB 7.0* 8.4* 9.3*  HCT 21.8* 25.4* 28.4*  PLT 173 146* 152  LABPROT 30.9* 25.1* 17.0*  INR 3.0* 2.3* 1.4*  CREATININE 0.76 0.78 0.76    Estimated Creatinine Clearance: 95.3 mL/min (by C-G formula based on SCr of 0.76 mg/dL).   Medical History: Past Medical History:  Diagnosis Date   Dilated cardiomyopathy (HCC)    Dilated cardiomyopathy (HCC) 02/2019   Polysubstance abuse (HCC)     Assessment: 79 yoM with HM3 LVAD on chronic warfarin admitted with DLI. Warfarin held with need for I&D in OR scheduled for 1/27. INR today down to 1.4 - will begin low-dose IV heparin with no titrations. Hgb improved from admit, pltc and LDH stable.  Goal of Therapy:  Heparin level <0.3 units/ml Monitor platelets by anticoagulation protocol: Yes   Plan:  Continue to hold warfarin Heparin 400 units/h - no bolus Check heparin level in 6h   ADDENDUM Heparin level undetectable, no changes to plan above  2/27, PharmD, BCPS, [facility] Clinical Pharmacist [phone] Please check AMION for all [facility] Pharmacy numbers 02/28/2021

## 2021-03-01 ENCOUNTER — Encounter (HOSPITAL_COMMUNITY): Payer: Self-pay | Admitting: Internal Medicine

## 2021-03-01 DIAGNOSIS — T827XXA Infection and inflammatory reaction due to other cardiac and vascular devices, implants and grafts, initial encounter: Secondary | ICD-10-CM | POA: Diagnosis not present

## 2021-03-01 LAB — CBC
HCT: 26.6 % — ABNORMAL LOW (ref 39.0–52.0)
Hemoglobin: 8.9 g/dL — ABNORMAL LOW (ref 13.0–17.0)
MCH: 30.6 pg (ref 26.0–34.0)
MCHC: 33.5 g/dL (ref 30.0–36.0)
MCV: 91.4 fL (ref 80.0–100.0)
Platelets: 175 10*3/uL (ref 150–400)
RBC: 2.91 MIL/uL — ABNORMAL LOW (ref 4.22–5.81)
RDW: 17.4 % — ABNORMAL HIGH (ref 11.5–15.5)
WBC: 5.6 10*3/uL (ref 4.0–10.5)
nRBC: 0 % (ref 0.0–0.2)

## 2021-03-01 LAB — BASIC METABOLIC PANEL
Anion gap: 9 (ref 5–15)
BUN: <5 mg/dL — ABNORMAL LOW (ref 6–20)
CO2: 24 mmol/L (ref 22–32)
Calcium: 8.9 mg/dL (ref 8.9–10.3)
Chloride: 101 mmol/L (ref 98–111)
Creatinine, Ser: 0.78 mg/dL (ref 0.61–1.24)
GFR, Estimated: >60 mL/min (ref >60)
Glucose, Bld: 104 mg/dL — ABNORMAL HIGH (ref 70–99)
Potassium: 3.5 mmol/L (ref 3.5–5.1)
Sodium: 134 mmol/L — ABNORMAL LOW (ref 135–145)

## 2021-03-01 LAB — TYPE AND SCREEN
ABO/RH(D): O POS
Antibody Screen: NEGATIVE

## 2021-03-01 LAB — BLOOD GAS, ARTERIAL
Acid-base deficit: 1 mmol/L (ref 0.0–2.0)
Bicarbonate: 22.9 mmol/L (ref 20.0–28.0)
Drawn by: 245601
FIO2: 21
O2 Saturation: 98 %
Patient temperature: 37
pCO2 arterial: 35.6 mmHg (ref 32.0–48.0)
pH, Arterial: 7.424 (ref 7.350–7.450)
pO2, Arterial: 99.8 mmHg (ref 83.0–108.0)

## 2021-03-01 LAB — PROTIME-INR
INR: 1.1 (ref 0.8–1.2)
Prothrombin Time: 13.8 seconds (ref 11.4–15.2)

## 2021-03-01 LAB — HEPARIN LEVEL (UNFRACTIONATED): Heparin Unfractionated: <0.1 IU/mL — ABNORMAL LOW (ref 0.30–0.70)

## 2021-03-01 LAB — MAGNESIUM: Magnesium: 1.6 mg/dL — ABNORMAL LOW (ref 1.7–2.4)

## 2021-03-01 LAB — LACTATE DEHYDROGENASE: LDH: 172 U/L (ref 98–192)

## 2021-03-01 MED ORDER — MAGNESIUM SULFATE 4 GM/100ML IV SOLN
4.0000 g | Freq: Once | INTRAVENOUS | Status: AC
  Administered 2021-03-01: 4 g via INTRAVENOUS
  Filled 2021-03-01: qty 100

## 2021-03-01 MED ORDER — POTASSIUM CHLORIDE CRYS ER 20 MEQ PO TBCR
40.0000 meq | EXTENDED_RELEASE_TABLET | Freq: Once | ORAL | Status: AC
  Administered 2021-03-01: 40 meq via ORAL
  Filled 2021-03-01: qty 2

## 2021-03-01 MED ORDER — CEFAZOLIN SODIUM-DEXTROSE 2-4 GM/100ML-% IV SOLN
2.0000 g | INTRAVENOUS | Status: AC
  Administered 2021-03-02: 2 g via INTRAVENOUS
  Filled 2021-03-01: qty 100

## 2021-03-01 NOTE — [place] FL2 (Signed)
[place] MEDICAID FL2 LEVEL OF CARE SCREENING TOOL     IDENTIFICATION  Patient Name: Jon Martin Birthdate: 08-16-1985 Sex: male Admission Date (Current Location): 02/26/2021  [place] and [place] Number:   ([address] [place]) [phone] Facility and Address:  [facility], [address], [place], [place] [address]      Provider Number: [id number]  Attending Physician Name and Address:  Dolores Patty, MD  Relative Name and Phone Number:  Parley Pidcock, Brother [phone]    Cleophus Molt, Sister   [phone]    Current Level of Care: Hospital Recommended Level of Care: Skilled Nursing Facility Prior Approval Number:    Date Approved/Denied:   PASRR Number:    Discharge Plan: SNF    Current Diagnoses: Patient Active Problem List   Diagnosis Date Noted   Malnutrition of moderate degree 05/23/2020   MSSA (methicillin susceptible Staphylococcus aureus) infection    Loss of teeth due to extraction    Retained dental root    Chronic periodontitis    Complication involving left ventricular assist device (LVAD) 04/28/2020   Dental caries 04/28/2020   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/2020   Left ventricular assist device (LVAD) complication 01/12/2020   Dehydration, moderate 08/02/2019   Chronic systolic heart failure (HCC) 08/02/2019   AKI (acute kidney injury) (HCC) 08/02/2019   Intractable vomiting with nausea 08/02/2019   LVAD (left ventricular assist device) present (HCC) 08/02/2019   Hypokalemia 08/02/2019   Congestive heart failure (HCC)    Cardiogenic shock (HCC)    Goals of care, counseling/discussion    Protein-calorie malnutrition, severe 02/11/2019   Dilated cardiomyopathy (HCC) 02/10/2019    Orientation RESPIRATION BLADDER Height & Weight     Self, Time, Situation, Place  Normal Continent Weight: 115 lb 8.3 oz (52.4 kg) Height:  5\' 6"  (167.6 cm)   BEHAVIORAL SYMPTOMS/MOOD NEUROLOGICAL BOWEL NUTRITION STATUS      Continent Diet (Please see DC Summary)  AMBULATORY STATUS COMMUNICATION OF NEEDS Skin   Limited Assist Verbally Wound Vac                       Personal Care Assistance Level of Assistance  Bathing, Feeding, Dressing Bathing Assistance: Limited assistance Feeding assistance: Independent Dressing Assistance: Limited assistance     Functional Limitations Info  Sight, Hearing, Speech Sight Info: Adequate Hearing Info: Adequate Speech Info: Adequate    SPECIAL CARE FACTORS FREQUENCY  PT (By licensed PT), OT (By licensed OT)     PT Frequency: 5x/week OT Frequency: 5x/week            Contractures Contractures Info: Not present    Additional Factors Info  Code Status, Allergies Code Status Info: Partial Allergies Info: No Known Allergies           Current Medications (03/01/2021):  This is the current hospital active medication list Current Facility-Administered Medications  Medication Dose Route Frequency Provider Last Rate Last Admin   acetaminophen (TYLENOL) tablet 650 mg  650 mg Oral Q4H PRN 03/03/2021, Brittainy M, PA-C       ceFAZolin (ANCEF) IVPB 2g/100 mL premix  2 g Intravenous Q8H Vu, Trung T, MD 200 mL/hr at 03/01/21 0501 2 g at 03/01/21 0501   heparin ADULT infusion 100 units/mL (25000 units/265mL)  400 Units/hr Intravenous Continuous 45m, RPH 4 mL/hr at 03/01/21 0300 400 Units/hr at 03/01/21 0300   magnesium sulfate IVPB 4 g 100  mL  4 g Intravenous Once Andrey Farmer, PA-C       ondansetron [facility]) injection 4 mg  4 mg Intravenous Q8H PRN Robbie Lis M, PA-C       sildenafil (REVATIO) tablet 20 mg  20 mg Oral TID Robbie Lis M, PA-C   20 mg at 03/01/21 8546     Discharge Medications: Please see discharge summary for a list of discharge medications.  Relevant Imaging Results:  Relevant Lab Results:   Additional Information SSN#: [id number] LVAD  Pt. IV antibiotics and wound vac management  Deshon Hsiao, LCSW

## 2021-03-01 NOTE — Evaluation (Signed)
Physical Therapy Evaluation & Discharge Patient Details Name: Jon Martin MRN: [id number] DOB: 09-26-1985 Today's Date: 03/01/2021  History of Present Illness  Pt is a 36 y.o. male admitted 02/26/21 with recurrent LVAD driveline infection. Echo without evidence of endocarditis. Plan for abdominal wound I&D and wound vac placement in OR. PMH includes severe systolic HF NICM, HM-3 LVAD placement (02/2019), prior polysubstance abuse.   Clinical Impression  Patient evaluated by Physical Therapy with no further acute PT needs identified. PTA, pt indep without DME, lives at home with family. Today, pt indep with mobility and LVAD management. All education has been completed and the patient has no further questions. Acute PT is signing off. Thank you for this referral.     Recommendations for follow up therapy are one component of a multi-disciplinary discharge planning process, led by the attending physician.  Recommendations may be updated based on patient status, additional functional criteria and insurance authorization.  Follow Up Recommendations No PT follow up    Assistance Recommended at Discharge Intermittent Supervision/Assistance  Patient can return home with the following  Other (comment) (assist with IV antibiotics)    Equipment Recommendations None recommended by PT  Recommendations for Other Services       Functional Status Assessment       Precautions / Restrictions Precautions Precautions: Other (comment) Precaution Comments: LVAD Restrictions Weight Bearing Restrictions: No      Mobility  Bed Mobility Overal bed mobility: Independent                  Transfers Overall transfer level: Independent   Transfers: Sit to/from Stand                  Ambulation/Gait Ambulation/Gait assistance: Independent Gait Distance (Feet):  (1000+) Assistive device: None, IV Pole Gait Pattern/deviations: WFL(Within Functional Limits)   Gait velocity  interpretation: >4.37 ft/sec, indicative of normal walking speed   General Gait Details: Indep with and without pushing IV pole  Stairs            Wheelchair Mobility    Modified Rankin (Stroke Patients Only)       Balance Overall balance assessment: Independent                                           Pertinent Vitals/Pain      Home Living Family/patient expects to be discharged to:: Private residence Living Arrangements: Other relatives (Niece) Available Help at Discharge: Family;Available PRN/intermittently Type of Home: House Home Access: Stairs to enter   Entrance Stairs-Number of Steps: 3   Home Layout: One level;Laundry or work area in [employer]: None      Prior Function Prior Level of Function : Independent/Modified Independent               ADLs Comments: Has a Therapist, art who comes "in the morning"     Hand Dominance   Dominant Hand: Right    Extremity/Trunk Assessment   Upper Extremity Assessment Upper Extremity Assessment: Overall WFL for tasks assessed    Lower Extremity Assessment Lower Extremity Assessment: Overall WFL for tasks assessed    Cervical / Trunk Assessment Cervical / Trunk Assessment: Other exceptions (LVAD)  Communication   Communication: No difficulties  Cognition  General Comments General comments (skin integrity, edema, etc.): indep with LVAD management    Exercises     Assessment/Plan    PT Assessment Patient does not need any further PT services  PT Problem List         PT Treatment Interventions      PT Goals (Current goals can be found in the Care Plan section)  Acute Rehab PT Goals PT Goal Formulation: All assessment and education complete, DC therapy    Frequency       Co-evaluation               AM-PAC PT "6 Clicks" Mobility  Outcome Measure Help needed turning from your back to your  side while in a flat bed without using bedrails?: None Help needed moving from lying on your back to sitting on the side of a flat bed without using bedrails?: None Help needed moving to and from a bed to a chair (including a wheelchair)?: None Help needed standing up from a chair using your arms (e.g., wheelchair or bedside chair)?: None Help needed to walk in hospital room?: None Help needed climbing 3-5 steps with a railing? : None 6 Click Score: 24    End of Session   Activity Tolerance: Patient tolerated treatment well Patient left: in bed;with call bell/phone within reach Nurse Communication: Mobility status PT Visit Diagnosis: Other abnormalities of gait and mobility (R26.89)    Time: [id number] PT Time Calculation (min) (ACUTE ONLY): 20 min   Charges:   PT Evaluation $PT Eval Low Complexity: 1 Low        Mabeline Caras, PT, DPT Acute Rehabilitation Services  Pager [phone] Office [phone]  Derry Lory 03/01/2021, 9:19 AM

## 2021-03-01 NOTE — Progress Notes (Signed)
LVAD Coordinator Rounding Note:  Admitted 02/26/21 due to Dr. Prescott Gum service for VAD drive line infection.   HM III LVAD implanted on 02/26/19 by Dr. Maren Beach under Destination Therapy criteria.  Patient laying in bed watching TV. Denies complaints. Plan for OR tomorrow afternoon with Dr Donata Clay for drive line debridement.   Had an episode of nausea and vomiting yesterday afternoon. Went into wide complex tachycardia rate 160s during vomiting episode, but spontaneously converted out on his own. He is in SR this morning.   Wound cultures with growth of staph aureus. Will obtain cultures on OR on Friday. Antibiotic management per ID team: Vancomycin 1000 mg IV every 12 hours.   Pt may need placement at SNF in [place], [place] at discharge if discharged with wound vac and/or IV antibiotics. [facility] Fuse contact information provided to inpatient CSW and case manager yesterday. (See note from 02/28/21 for contact info)  Vital signs: Temp: 98.5 HR: 83 Doppler Pressure: 82 Automatic BP: not documented O2 Sat: 99% RA  Wt: 111.9>115.3>115.5  lbs  LVAD interrogation reveals:  Speed: 5400 Flow: 3.6 Power: 3.8 w PI: 4.5 Hct: 20 - DO NOT CHANGE (reminder placed on VAD cart)  Alarms: none Events: 48 PI events since yesterday afternoon  Fixed speed: 5400 Low speed limit: 5100  Drive Line:  Existing VAD dressing removed and site care performed using sterile technique. Drive line exit site cleaned with CHG x 2, and rinsed with sterile saline, and allowed to dry, and abdominal pad/gauze dressing with silver strip re-applied. Exit site partially incorporated with velour fully implanted. Moderate amount of yellow/green thick/slimy/bloody drainage, with foul odor, and rash noted all around site. Site tunnels 4.5 cm. Pt denies fever or chills. Anchor reapplied. Daily dressing changes per bedside RN or VAD coordinator. Next dressing change will be in the OR tomorrow.   Abdominal Abcsess: Abdominal  wound/abscess x 2 with thick yellow/bloody drainage. Tunneling noted 4 cm from one abscessed area to the other above the umbilicus. Site cleansed with CHG swab x 2, allowed to dry, and rinsed with sterile saline. Covered with ABD pad. Daily dressing changes per bedside RN or VAD coordinator. Next dressing will be in the OR tomorrow.    Labs:  LDH trend: 154>145>172  INR trend: 2.3>1.4>1.1  Hgb trend: 7.0>8.4>9.3>8.9  Anticoagulation Plan: -INR Goal: 2.0 - 2.5 -ASA Dose: none  Blood Products:  - 02/26/21>>1 units PCs  Device: N/A  Infection:  02/26/21 Drive line wound culture>> few staph aureus; final  02/26/21 Abdominal abscess culture>> few staph aureus; final  02/26/21 Blood cultures >> no growth 3 days; final pending   Drips:  Heparin 400 units/hr  Plan/Recommendations:  1. Call VAD Coordinator if any VAD equipment or drive line issues. 2. Daily drive line dressing changes per bedside RN or VAD coordinator 3. VAD coordinator will accompany pt to OR tomorrow for drive line debridement with Dr Donata Clay.   Alyce Pagan RN VAD Coordinator  Office: [phone]  24/7 Pager: [phone]

## 2021-03-01 NOTE — Progress Notes (Signed)
ANTICOAGULATION CONSULT NOTE  Pharmacy Consult for heparin Indication:  LVAD  No Known Allergies  Patient Measurements: Height: 5\' 6"  (167.6 cm) Weight: 52.4 kg (115 lb 8.3 oz) IBW/kg (Calculated) : 63.8 Heparin Dosing Weight: 52kg  Vital Signs: Temp: 98.9 F (37.2 C) (01/26 0745) Temp Source: Oral (01/26 0745) BP: 94/83 (01/26 0745) Pulse Rate: 83 (01/26 0745)  Labs: Recent Labs    02/27/21 0552 02/28/21 0124 02/28/21 1411 03/01/21 0113  HGB 8.4* 9.3*  --  8.9*  HCT 25.4* 28.4*  --  26.6*  PLT 146* 152  --  175  LABPROT 25.1* 17.0*  --  13.8  INR 2.3* 1.4*  --  1.1  HEPARINUNFRC  --   --  <0.10* <0.10*  CREATININE 0.78 0.76  --  0.78     Estimated Creatinine Clearance: 95.5 mL/min (by C-G formula based on SCr of 0.78 mg/dL).   Medical History: Past Medical History:  Diagnosis Date   Dilated cardiomyopathy ([place])    Dilated cardiomyopathy ([place]) 02/2019   Polysubstance abuse ([place])     Assessment: 58 yoM with HM3 LVAD on chronic warfarin admitted with DLI. Warfarin held with need for I&D in OR scheduled for 1/27.   INR is down to 1.1 - continues on low-dose IV heparin with no titrations. Hgb 8.9, plt stable at 175, LDH stable at 172. No s/sx of bleeding.   Goal of Therapy:  Heparin level <0.3 units/ml Monitor platelets by anticoagulation protocol: Yes   Plan:  Continue to hold warfarin Heparin 400 units/h - no bolus Will check daily HL, CBC, and for s/sx of bleeding   Antonietta Jewel, PharmD, BCCCP Clinical Pharmacist  Phone: [phone] 03/01/2021 8:00 AM  Please check AMION for all [place] phone numbers After 10:00 PM, call [place] [phone]

## 2021-03-01 NOTE — Progress Notes (Signed)
Mobility Specialist: Progress Note   03/01/21 1652  Mobility  Activity Ambulated independently in hallway  Level of Assistance Independent  Assistive Device  (IV Pole)  Distance Ambulated (ft) 800 ft  Activity Response Tolerated well  $Mobility charge 1 Mobility   Post-Mobility: 92 HR  Received pt in bed having no complaints and agreeable to mobility. Asymptomatic throughout ambulation, returned back to bed w/ call bell in reach and all needs met.  [facility] Rasmus Preusser Mobility Specialist Mobility Specialist 4 [place]: [phone] Mobility Specialist 2 [place] and [place]: [phone]

## 2021-03-01 NOTE — TOC Progression Note (Signed)
Transition of Care [facility]) - Progression Note    Patient Details  Name: Jon Martin MRN: [id number] Date of Birth: 01-Jul-1985  [facility] of [facility]) CM/SW Contact  Aleksa Collinsworth, LCSW Phone Number: 03/01/2021, 11:13 AM  Clinical Narrative:    HF CSW faxed clinicals to Otila Kluver at [facility] [phone] confirmation faxed received and noted on referral that [name] will need assistance with IV Abx and wound vac management. CSW called Otila Kluver at [facility] [phone] and she reported she just received the fax and CSW informed Otila Kluver about the IV Abx and wound vac management needed and Otila Kluver reported that the DON will need to take a look at the clinicals before they are able to extend a bed offer and she is in meetings and will be a bit before they can get back to the CSW. Otila Kluver reported as soon as the DON can review she will let the CSW know. HF CSW also informed Otila Kluver that [name] has been at their facility in the past as well.  CSW will continue to follow throughout discharge.   Expected Discharge Plan: [place] Barriers to Discharge: Continued Medical Work up  Expected Discharge Plan and Services Expected Discharge Plan: [place]   Discharge Planning Services: CM Consult Post Acute Care Choice: [place] arrangements for the past 2 months: Single Family Home                                       Social Determinants of Health (SDOH) Interventions    Readmission Risk Interventions No flowsheet data found.  Kennie Karapetian, MSW, LCSW [phone] Heart Failure Social Worker

## 2021-03-01 NOTE — Progress Notes (Addendum)
Advanced Heart Failure VAD Team Note  PCP-Cardiologist: Dr. Gala Romney   Subjective:    1/23: Admitted for recurrent DL infection - WCx + staph aureus. BCx NGTD. Abd CT pending  1/23: Transfused 1uRBC Hgb 7.0>>8.4  CT of C/A/P w/ stranding in the anterior abdominal wall slightly above the umbilicus adjacent to lead extending to the left ventricular assist device, suggestive of possible cellulitis. No loculated fluid collection.   ID following, abx narrowed to just vanc. AF. WBC 5.6. Blood cx NGTD.   Hgb trending up, 7.0>>8.4>>9.3>>8.9 On heparin gtt.   MAPs 70s-80s  Had episodes of emesis yesterday afternoon. Feeling better today. Ate breakfast. Getting ready to go for a walk with PT. No CP or dyspnea.    LVAD INTERROGATION:  HeartMate III LVAD:   Flow 3.8 liters/min, speed 5400 power 3.9, PI 4.6,  22 PI events, personally reviewed.    Objective:    Vital Signs:   Temp:  [98.2 F (36.8 C)-99.2 F (37.3 C)] 98.9 F (37.2 C) (01/26 0745) Pulse Rate:  [81-100] 83 (01/26 0745) Resp:  [14-18] 17 (01/26 0745) BP: (80-133)/(50-84) 94/83 (01/26 0745) SpO2:  [95 %-100 %] 100 % (01/26 0745) Weight:  [52.4 kg] 52.4 kg (01/26 0515) Last BM Date: 02/28/21 Mean arterial Pressure 70s-80s  Intake/Output:   Intake/Output Summary (Last 24 hours) at 03/01/2021 0807 Last data filed at 03/01/2021 0515 Gross per 24 hour  Intake 1952.48 ml  Output 1350 ml  Net 602.48 ml     Physical Exam    Physical Exam: GENERAL: Young AAM sitting on side of bed, no distress HEENT: normal  NECK: Supple, No JVD.  2+ bilaterally, no bruits.  No lymphadenopathy or thyromegaly appreciated.   CARDIAC:  Mechanical heart sounds with LVAD hum present.  LUNGS:  Clear to auscultation bilaterally.  ABDOMEN:  Soft, round, nontender, positive bowel sounds x4.     LVAD exit site: Dressing dry and intact.   Stabilization device present and accurately applied.   EXTREMITIES:  Warm and dry, no cyanosis,  clubbing, rash or edema  NEUROLOGIC:  Alert and oriented x 4.  Gait steady.  No aphasia.  No dysarthria.  Affect pleasant.      Telemetry   NSR 80s, 1 run WCT about 2/min in duration around 4 pm on 01/25 (? SVT)  EKG   N/A   Labs   Basic Metabolic Panel: Recent Labs  Lab 02/26/21 1414 02/27/21 0552 02/28/21 0124 03/01/21 0113  NA 138 134* 131* 134*  K 3.4* 3.5 3.6 3.5  CL 105 103 101 101  CO2 21* 23 21* 24  GLUCOSE 86 89 101* 104*  BUN 6 7 <5* <5*  CREATININE 0.76 0.78 0.76 0.78  CALCIUM 8.6* 8.6* 9.1 8.9  MG 1.9  --   --   --     Liver Function Tests: Recent Labs  Lab 02/26/21 1414  AST 45*  ALT 25  ALKPHOS 59  BILITOT 0.4  PROT 7.4  ALBUMIN 3.8   No results for input(s): LIPASE, AMYLASE in the last 168 hours. No results for input(s): AMMONIA in the last 168 hours.  CBC: Recent Labs  Lab 02/26/21 1414 02/27/21 0552 02/28/21 0124 03/01/21 0113  WBC 5.4 4.8 4.6 5.6  HGB 7.0* 8.4* 9.3* 8.9*  HCT 21.8* 25.4* 28.4* 26.6*  MCV 94.4 92.0 91.6 91.4  PLT 173 146* 152 175    INR: Recent Labs  Lab 02/26/21 1414 02/27/21 0552 02/28/21 0124 03/01/21 0113  INR 3.0* 2.3* 1.4*  1.1    Other results: EKG:    Imaging   CT CHEST ABDOMEN PELVIS W CONTRAST  Result Date: 02/27/2021 CLINICAL DATA:  Sepsis EXAM: CT CHEST, ABDOMEN, AND PELVIS WITH CONTRAST TECHNIQUE: Multidetector CT imaging of the chest, abdomen and pelvis was performed following the standard protocol during bolus administration of intravenous contrast. RADIATION DOSE REDUCTION: This exam was performed according to the departmental dose-optimization program which includes automated exposure control, adjustment of the mA and/or kV according to patient size and/or use of iterative reconstruction technique. CONTRAST:  OMNIPAQUE IOHEXOL 350 MG/ML SOLN COMPARISON:  Previous studies including the examination of 05/12/2020 FINDINGS: CT CHEST FINDINGS Cardiovascular: There is left ventricular  assist pump adjacent to the apex of the left ventricle. Mediastinum/Nodes: No significant lymphadenopathy seen in the mediastinum. There are few slightly enlarged lymph nodes in both axillary regions largest measuring 12 mm in short axis. Lungs/Pleura: There is no focal pulmonary consolidation. There are no discrete noncalcified lung nodules. Beam hardening artifacts caused by left ventricular assist device limit evaluation of adjacent lung fields. There is no pleural effusion or pneumothorax. CT ABDOMEN PELVIS FINDINGS Hepatobiliary: Liver measures 15.2 cm in length. No focal abnormality is seen. Gallbladder is unremarkable. Pancreas: No focal abnormality is seen. Spleen: Unremarkable. Adrenals/Urinary Tract: Adrenals are not enlarged. There is no hydronephrosis. There are no renal or ureteral stones. Urinary bladder is not distended. Stomach/Bowel: There is mild diffuse wall thickening in the fundus of the stomach. Small bowel loops are not dilated. Appendix is not dilated. There is no significant wall thickening in colon. There is no pericolic stranding. Vascular/Lymphatic: Vascular structures are unremarkable. No new significant lymphadenopathy seen. Reproductive: Unremarkable. Other: There is no ascites or pneumoperitoneum. There is stranding in the fat planes adjacent to a lead attached to the left ventricular assist device slightly superior to the umbilicus. There is skin thickening. There is tiny pocket of air in the skin. There is no demonstrable thick-walled loculated fluid collection. Musculoskeletal: Metallic sutures are seen in the sternum. Bony structures are otherwise unremarkable. IMPRESSION: There is skin thickening and subcutaneous stranding in the anterior abdominal wall slightly above the umbilicus adjacent to lead extending to the left ventricular assist device. Findings suggest possible cellulitis. There is no demonstrable loculated fluid collection in this region. There is no focal pulmonary  consolidation. There is no pleural effusion. No significant lymphadenopathy seen in mediastinum. There is no evidence of intestinal obstruction or pneumoperitoneum. Appendix is not dilated. There is no hydronephrosis. There are no abnormal loculated fluid collections in the abdomen and pelvis. There is diffuse wall thickening in the fundus of the stomach which may be due to incomplete distention or gastritis or neoplastic process. Electronically Signed   By: Ernie Avena M.D.   On: 02/27/2021 15:50   ECHOCARDIOGRAM LIMITED  Result Date: 02/27/2021    ECHOCARDIOGRAM LIMITED REPORT   Patient Name:   Jon Martin Date of Exam: 02/27/2021 Medical Rec #:  [id number]       Height:       66.0 in Accession #:    [id number]      Weight:       112.0 lb Date of Birth:  [birth year]        BSA:          1.563 m Patient Age:    35 years        BP:           97/83 mmHg Patient Gender: M  HR:           99 bpm. Exam Location:  Inpatient Procedure: Limited Color Doppler, Cardiac Doppler and Limited Echo Indications:    infection. LVAD.  History:        Patient has prior history of Echocardiogram examinations, most                 recent 01/14/2020. Cardiomyopathy.  Sonographer:    Delcie Roch RDCS Referring Phys: [id number] PETER VANTRIGT  Sonographer Comments: Technically difficult study due to poor echo windows. IMPRESSIONS  1. Limited study for LVAD Assessment.     Parameters not entered, study assumes most recent documented LVAD parameters:     Flow 3.5 liters/min, speed 5400, power 3.8, PI 4.6     Inflow cannula visualized without Doppler interrogation.     Outflow cannular visualized without Doppler interrogation. Left ventricular ejection fraction, by estimation, is 40 to 45%. The left ventricle has mildly decreased function. The left ventricle demonstrates global hypokinesis. Neutral septum.  2. Right ventricular systolic function is mildly reduced with basal function largely preserved. The right  ventricular size is severely enlarged.  3. The mitral valve is normal in structure. Mild to moderate mitral valve regurgitation. No evidence of mitral stenosis.  4. The aortic valve is tricuspid. Aortic valve regurgitation is not visualized. Aortic valve opens consistently. FINDINGS  Left Ventricle: Limited study for LVAD Assessment. Parameters not entered, study assumes most recent documented LVAD parameters: Flow 3.5 liters/min, speed 5400, power 3.8, PI 4.6 Inflow cannula visualized without Doppler interrogation. Outflow cannular visualized without Doppler interrogation. Left ventricular ejection fraction, by estimation, is 40 to 45%. The left ventricle has mildly decreased function. The left ventricle demonstrates global hypokinesis. Right Ventricle: The right ventricular size is severely enlarged. No increase in right ventricular wall thickness. Right ventricular systolic function is mildly reduced. Mitral Valve: The mitral valve is normal in structure. Mild to moderate mitral valve regurgitation. No evidence of mitral valve stenosis. Tricuspid Valve: The tricuspid valve is normal in structure. Tricuspid valve regurgitation is mild . No evidence of tricuspid stenosis. Aortic Valve: The aortic valve is tricuspid. Aortic valve regurgitation is not visualized. Pulmonic Valve: The pulmonic valve was not well visualized. Pulmonic valve regurgitation is not visualized. No evidence of pulmonic stenosis. IVC IVC diam: 1.10 cm TRICUSPID VALVE TR Peak grad:   17.5 mmHg TR Vmax:        209.00 cm/s Riley Lam MD Electronically signed by Riley Lam MD Signature Date/Time: 02/27/2021/4:32:35 PM    Final      Medications:     Scheduled Medications:  sildenafil  20 mg Oral TID    Infusions:   ceFAZolin (ANCEF) IV 2 g (03/01/21 0501)   heparin 400 Units/hr (03/01/21 0300)    PRN Medications: acetaminophen, ondansetron (ZOFRAN) IV   Patient Profile   36 y/o w/ chronic systolic heart  failure, NICM, s/p MH3 LVAD w/ prior history of MSSA DL infection, being admitted for recurrent DL infection.   Assessment/Plan:    1. Recurrent DL Infection  - previous MSSA DL infection 3/00, treated w/ surgical debridement and IV abx - Now w/ recurrent infection. Wound Cx  + staph aureus. BCx NGTD  - CT of C/A/P w/ stranding in the anterior abdominal wall slightly above the umbilicus adjacent to lead extending to the left ventricular assist device, suggestive of possible cellulitis. No loculated fluid collection.  - Abx narrowed to Vanc - Plan wound debridement + wound vac on 1/27 per Dr.  Zenaida Niece Trigt - Holding Coumadin, INR 1.1 today. On heparin gtt per Pharmacy - ID following, appreciate their assistance    2. Chronic systolic HF due to severe NICM - Echo EF 10% with biventricular failure - HM-3 VAD implant 02/26/19 - Echo 7/21 EF 40-45% - Stable NYHA I. Volume status looks good.  - Continue VAD speed at 5400 - Continue sildenafil - Has been off carvedilol  - MAPs 70s-80s. Monitor. - Several minutes WCT with rate up to 160s during episode emesis yesterday afternoon. ? SVT. Converted spontaneously. No recurrence.   3. VAD - Admit 12/21 with multiple low flow alarms. Rising LDH and decreasing PLTs. Suspected outflow graft kink occlusion. Patient placed on his left side and resolved.  - VAD interrogated personally. Parameters stable. - Treating for DL infection per above  - Daily dressing changes  - Holding Coumadin for wound debridement, INR 1.1. On heparin gtt -  LDH stable at 172  4. HTN - MAPs 70s-80s - Off home Coreg and Losartan  5. Anemia - Admit Hgb 7.0 - Transfused 1 u RBC 1/23 - Hgb 8.9 today  - Denies abnormal bleeding - Follow H/H   6. Hypokalemia - 3.4>>3.5>3.6> 3.5 - Supp K - Check magnesium level  I reviewed the LVAD parameters from today, and compared the results to the patient's prior recorded data.  No programming changes were made.  The LVAD is  functioning within specified parameters.  The patient performs LVAD self-test daily.  LVAD interrogation was negative for any significant power changes, alarms or PI events/speed drops.  LVAD equipment check completed and is in good working order.  Back-up equipment present.   LVAD education done on emergency procedures and precautions and reviewed exit site care.  Length of Stay: 3  FINCH, LINDSAY N, PA-C 03/01/2021, 8:07 AM  VAD Team --- VAD ISSUES ONLY--- Pager [phone] (7am - 7am)  Advanced Heart Failure Team  Pager [phone] (M-F; 7a - 5p)  Please contact [facility] for night-coverage after hours (5p -7a ) and weekends on [email or web address]  Patient seen and examined with the above-signed Advanced Practice Provider and/or Housestaff. I personally reviewed laboratory data, imaging studies and relevant notes. I independently examined the patient and formulated the important aspects of the plan. I have edited the note to reflect any of my changes or salient points. I have personally discussed the plan with the patient and/or family.  Had emesis yesterday but now feeling better. Remains on IV abx. No fevers or chills.Remains on Iv heparin. No bleeding  General:  NAD.  HEENT: normal  Neck: supple. JVP not elevated.  Carotids 2+ bilat; no bruits. No lymphadenopathy or thryomegaly appreciated. Cor: LVAD hum.  Lungs: Clear. Abdomen: soft, nontender, non-distended. No hepatosplenomegaly. No bruits or masses. Good bowel sounds. Driveline site dressed. Anchor in place.  Extremities: no cyanosis, clubbing, rash. Warm no edema  Neuro: alert & oriented x 3. No focal deficits. Moves all 4 without problem   Continues on IV abx for DL site infection. No deep abscess on CT. For I&D with wound vac tomorrow. Continue heparin.  Will need SNF at d/c.  VAD interrogated personally. Parameters stable.  Arvilla Meres, MD  1:26 PM

## 2021-03-01 NOTE — Evaluation (Signed)
Occupational Therapy Evaluation Patient Details Name: Jon Martin MRN: [id number] DOB: Jun 15, 1985 Today's Date: 03/01/2021   History of Present Illness Pt is a 36 y.o. male admitted 02/26/21 with recurrent LVAD driveline infection. Echo without evidence of endocarditis. Plan for abdominal wound I&D and wound vac placement in OR. PMH includes severe systolic HF NICM, HM-3 LVAD placement (02/2019), prior polysubstance abuse.   Clinical Impression   PTA, pt was living with his niece and was independent. Currently, pt performing for ADLs and functional mobility at independent level near baseline. Pt demonstrating good balance, endurance, ROM, and management of LVAD lines. Answered all pt questions. Recommend dc home once medically stable per physician. All acute OT needs met and will sign off. Thank you.      Recommendations for follow up therapy are one component of a multi-disciplinary discharge planning process, led by the attending physician.  Recommendations may be updated based on patient status, additional functional criteria and insurance authorization.   Follow Up Recommendations  No OT follow up    Assistance Recommended at Discharge Intermittent Supervision/Assistance  Patient can return home with the following      Functional Status Assessment  Patient has had a recent decline in their functional status and demonstrates the ability to make significant improvements in function in a reasonable and predictable amount of time.  Equipment Recommendations  None recommended by OT    Recommendations for Other Services PT consult     Precautions / Restrictions Precautions Precautions: Other (comment) Precaution Comments: LVAD Restrictions Weight Bearing Restrictions: No      Mobility Bed Mobility Overal bed mobility: Independent                  Transfers Overall transfer level: Independent                        Balance Overall balance assessment:  Independent                                         ADL either performed or assessed with clinical judgement   ADL Overall ADL's : Independent                                       General ADL Comments: Presenting near baseline level. easily managing LVAD     Vision         Perception     Praxis      Pertinent Vitals/Pain Pain Assessment Pain Assessment: Faces Faces Pain Scale: No hurt Pain Intervention(s): Monitored during session     Hand Dominance Right   Extremity/Trunk Assessment Upper Extremity Assessment Upper Extremity Assessment: Overall WFL for tasks assessed   Lower Extremity Assessment Lower Extremity Assessment: Overall WFL for tasks assessed   Cervical / Trunk Assessment Cervical / Trunk Assessment: Other exceptions (LVAD)   Communication Communication Communication: No difficulties   Cognition Arousal/Alertness: Awake/alert Behavior During Therapy: WFL for tasks assessed/performed, Impulsive Overall Cognitive Status: No family/caregiver present to determine baseline cognitive functioning Area of Impairment: Attention                   Current Attention Level: Selective, Alternating                 General Comments  VSS on RA  Exercises     Shoulder Instructions      Home Living Family/patient expects to be discharged to:: Private residence Living Arrangements: Other relatives (Niece) Available Help at Discharge: Family;Available PRN/intermittently Type of Home: House Home Access: Stairs to enter Entrance Stairs-Number of Steps: 3   Home Layout: One level;Laundry or work area in basement     [employer] Shower/Tub: Occupational psychologist: None          Prior Functioning/Environment Prior Level of Function : Independent/Modified Independent               ADLs Comments: Has a Therapist, art who comes "in the morning"        OT Problem  List: Decreased activity tolerance;Decreased knowledge of precautions      OT Treatment/Interventions:      OT Goals(Current goals can be found in the care plan section) Acute Rehab OT Goals Patient Stated Goal: Go home OT Goal Formulation: All assessment and education complete, DC therapy  OT Frequency:      Co-evaluation              AM-PAC OT "6 Clicks" Daily Activity     Outcome Measure Help from another person eating meals?: None Help from another person taking care of personal grooming?: None Help from another person toileting, which includes using toliet, bedpan, or urinal?: None Help from another person bathing (including washing, rinsing, drying)?: None Help from another person to put on and taking off regular upper body clothing?: None Help from another person to put on and taking off regular lower body clothing?: None 6 Click Score: 24   End of Session Nurse Communication: Mobility status  Activity Tolerance: Patient tolerated treatment well Patient left: in chair;with call bell/phone within reach  OT Visit Diagnosis: Unsteadiness on feet (R26.81);Other abnormalities of gait and mobility (R26.89);Muscle weakness (generalized) (M62.81)                Time: 4436-0165 OT Time Calculation (min): 20 min Charges:  OT General Charges $OT Visit: 1 Visit OT Evaluation $OT Eval Moderate Complexity: [place], OTR/L Acute Rehab Pager: [phone] Office: [place] 03/01/2021, 8:44 AM

## 2021-03-02 ENCOUNTER — Encounter (HOSPITAL_COMMUNITY): Payer: Self-pay | Admitting: Internal Medicine

## 2021-03-02 ENCOUNTER — Inpatient Hospital Stay (HOSPITAL_COMMUNITY): Payer: Medicaid Other | Admitting: Certified Registered Nurse Anesthetist

## 2021-03-02 ENCOUNTER — Encounter (HOSPITAL_COMMUNITY)
Admission: AD | Disposition: A | Discharge status: Home / Self Care | Payer: Self-pay | Source: Ambulatory Visit | Attending: Internal Medicine

## 2021-03-02 ENCOUNTER — Other Ambulatory Visit (HOSPITAL_COMMUNITY): Payer: Self-pay | Admitting: *Deleted

## 2021-03-02 DIAGNOSIS — T827XXA Infection and inflammatory reaction due to other cardiac and vascular devices, implants and grafts, initial encounter: Secondary | ICD-10-CM

## 2021-03-02 HISTORY — PX: APPLICATION OF WOUND VAC: SHX5189

## 2021-03-02 HISTORY — PX: STERNAL WOUND DEBRIDEMENT: SHX1058

## 2021-03-02 LAB — HEPARIN LEVEL (UNFRACTIONATED): Heparin Unfractionated: <0.1 IU/mL — ABNORMAL LOW (ref 0.30–0.70)

## 2021-03-02 LAB — BASIC METABOLIC PANEL
Anion gap: 8 (ref 5–15)
BUN: <5 mg/dL — ABNORMAL LOW (ref 6–20)
CO2: 24 mmol/L (ref 22–32)
Calcium: 8.6 mg/dL — ABNORMAL LOW (ref 8.9–10.3)
Chloride: 103 mmol/L (ref 98–111)
Creatinine, Ser: 0.66 mg/dL (ref 0.61–1.24)
GFR, Estimated: >60 mL/min (ref >60)
Glucose, Bld: 98 mg/dL (ref 70–99)
Potassium: 3.7 mmol/L (ref 3.5–5.1)
Sodium: 135 mmol/L (ref 135–145)

## 2021-03-02 LAB — SURGICAL PCR SCREEN
MRSA, PCR: NEGATIVE
Staphylococcus aureus: NEGATIVE

## 2021-03-02 LAB — CBC
HCT: 25.4 % — ABNORMAL LOW (ref 39.0–52.0)
Hemoglobin: 8.3 g/dL — ABNORMAL LOW (ref 13.0–17.0)
MCH: 30.2 pg (ref 26.0–34.0)
MCHC: 32.7 g/dL (ref 30.0–36.0)
MCV: 92.4 fL (ref 80.0–100.0)
Platelets: 178 10*3/uL (ref 150–400)
RBC: 2.75 MIL/uL — ABNORMAL LOW (ref 4.22–5.81)
RDW: 17.6 % — ABNORMAL HIGH (ref 11.5–15.5)
WBC: 4.8 10*3/uL (ref 4.0–10.5)
nRBC: 0 % (ref 0.0–0.2)

## 2021-03-02 LAB — LACTATE DEHYDROGENASE: LDH: 163 U/L (ref 98–192)

## 2021-03-02 LAB — PROTIME-INR
INR: 1 (ref 0.8–1.2)
Prothrombin Time: 13.5 seconds (ref 11.4–15.2)

## 2021-03-02 LAB — MAGNESIUM: Magnesium: 2 mg/dL (ref 1.7–2.4)

## 2021-03-02 SURGERY — DEBRIDEMENT, WOUND, STERNUM
Anesthesia: General

## 2021-03-02 MED ORDER — SODIUM CHLORIDE 0.9 % IR SOLN
Status: DC | PRN
  Administered 2021-03-02: 1000 mL

## 2021-03-02 MED ORDER — CHLORHEXIDINE GLUCONATE 0.12 % MT SOLN
15.0000 mL | Freq: Once | OROMUCOSAL | Status: AC
  Administered 2021-03-02: 15 mL via OROMUCOSAL
  Filled 2021-03-02: qty 15

## 2021-03-02 MED ORDER — PROMETHAZINE HCL 25 MG/ML IJ SOLN: 6.2500 mg | INTRAMUSCULAR | Status: DC | PRN

## 2021-03-02 MED ORDER — FENTANYL CITRATE (PF) 250 MCG/5ML IJ SOLN
INTRAMUSCULAR | Status: DC | PRN
  Administered 2021-03-02 (×4): 50 ug via INTRAVENOUS

## 2021-03-02 MED ORDER — VANCOMYCIN HCL 1000 MG IV SOLR
INTRAVENOUS | Status: AC
  Filled 2021-03-02: qty 20

## 2021-03-02 MED ORDER — FENTANYL CITRATE (PF) 250 MCG/5ML IJ SOLN
INTRAMUSCULAR | Status: AC
  Filled 2021-03-02: qty 5

## 2021-03-02 MED ORDER — CEFAZOLIN SODIUM-DEXTROSE 2-4 GM/100ML-% IV SOLN: 2.0000 g | INTRAVENOUS | Status: DC

## 2021-03-02 MED ORDER — POTASSIUM CHLORIDE CRYS ER 20 MEQ PO TBCR
40.0000 meq | EXTENDED_RELEASE_TABLET | Freq: Once | ORAL | Status: AC
  Administered 2021-03-02: 40 meq via ORAL
  Filled 2021-03-02: qty 2

## 2021-03-02 MED ORDER — AMISULPRIDE (ANTIEMETIC) 5 MG/2ML IV SOLN: 10.0000 mg | Freq: Once | INTRAVENOUS | Status: DC | PRN

## 2021-03-02 MED ORDER — ONDANSETRON HCL 4 MG/2ML IJ SOLN
INTRAMUSCULAR | Status: DC | PRN
  Administered 2021-03-02: 4 mg via INTRAVENOUS

## 2021-03-02 MED ORDER — FENTANYL CITRATE (PF) 100 MCG/2ML IJ SOLN: 25.0000 ug | INTRAMUSCULAR | Status: DC | PRN

## 2021-03-02 MED ORDER — ETOMIDATE 2 MG/ML IV SOLN
INTRAVENOUS | Status: DC | PRN
  Administered 2021-03-02: 16 mg via INTRAVENOUS

## 2021-03-02 MED ORDER — LIDOCAINE 2% (20 MG/ML) 5 ML SYRINGE
INTRAMUSCULAR | Status: DC | PRN
Start: 2021-03-02 — End: 2021-03-02
  Administered 2021-03-02: 60 mg via INTRAVENOUS

## 2021-03-02 MED ORDER — PROPOFOL 10 MG/ML IV BOLUS
INTRAVENOUS | Status: AC
  Filled 2021-03-02: qty 20

## 2021-03-02 MED ORDER — VANCOMYCIN HCL 1000 MG IV SOLR
INTRAVENOUS | Status: DC | PRN
  Administered 2021-03-02: 1000 mg

## 2021-03-02 MED ORDER — MORPHINE SULFATE (PF) 2 MG/ML IV SOLN: 2.0000 mg | INTRAVENOUS | Status: DC | PRN

## 2021-03-02 MED ORDER — DEXAMETHASONE SODIUM PHOSPHATE 10 MG/ML IJ SOLN
INTRAMUSCULAR | Status: DC | PRN
Start: 2021-03-02 — End: 2021-03-02
  Administered 2021-03-02: 10 mg via INTRAVENOUS

## 2021-03-02 MED ORDER — ORAL CARE MOUTH RINSE: 15.0000 mL | Freq: Once | OROMUCOSAL | Status: AC

## 2021-03-02 MED ORDER — ALBUMIN HUMAN 5 % IV SOLN
INTRAVENOUS | Status: DC | PRN
Start: 2021-03-02 — End: 2021-03-02

## 2021-03-02 MED ORDER — MIDAZOLAM HCL 2 MG/2ML IJ SOLN
INTRAMUSCULAR | Status: AC
  Filled 2021-03-02: qty 2

## 2021-03-02 MED ORDER — MIDAZOLAM HCL 2 MG/2ML IJ SOLN
INTRAMUSCULAR | Status: DC | PRN
  Administered 2021-03-02: 2 mg via INTRAVENOUS

## 2021-03-02 MED ORDER — LACTATED RINGERS IV SOLN: INTRAVENOUS | Status: DC

## 2021-03-02 MED ORDER — PROPOFOL 10 MG/ML IV BOLUS
INTRAVENOUS | Status: DC | PRN
  Administered 2021-03-02: 50 mg via INTRAVENOUS

## 2021-03-02 SURGICAL SUPPLY — 71 items
ATTRACTOMAT 16X20 MAGNETIC DRP (DRAPES) ×1 IMPLANT
BAG DECANTER FOR FLEXI CONT (MISCELLANEOUS) ×1 IMPLANT
BENZOIN TINCTURE PRP APPL 2/3 (GAUZE/BANDAGES/DRESSINGS) IMPLANT
BLADE CLIPPER SURG (BLADE) ×2 IMPLANT
BLADE SURG 10 STRL SS (BLADE) ×2 IMPLANT
BLADE SURG 15 STRL LF DISP TIS (BLADE) IMPLANT
BLADE SURG 15 STRL SS (BLADE)
BNDG GAUZE ELAST 4 BULKY (GAUZE/BANDAGES/DRESSINGS) IMPLANT
CANISTER SUCT 3000ML PPV (MISCELLANEOUS) ×2 IMPLANT
CANISTER WOUND CARE 500ML ATS (WOUND CARE) ×2 IMPLANT
CATH FOLEY 2WAY SLVR  5CC 16FR (CATHETERS)
CATH FOLEY 2WAY SLVR 5CC 16FR (CATHETERS) IMPLANT
CATH THORACIC 28FR RT ANG (CATHETERS) IMPLANT
CATH THORACIC 36FR (CATHETERS) IMPLANT
CLIP VESOCCLUDE SM WIDE 24/CT (CLIP) IMPLANT
CNTNR URN SCR LID CUP LEK RST (MISCELLANEOUS) IMPLANT
CONN Y 3/8X3/8X3/8  BEN (MISCELLANEOUS)
CONN Y 3/8X3/8X3/8 BEN (MISCELLANEOUS) IMPLANT
CONT SPEC 4OZ STRL OR WHT (MISCELLANEOUS)
COVER SURGICAL LIGHT HANDLE (MISCELLANEOUS) ×4 IMPLANT
DRAPE CHEST BREAST 15X10 FENES (DRAPES) ×1 IMPLANT
DRAPE LAPAROSCOPIC ABDOMINAL (DRAPES) ×1 IMPLANT
DRAPE SLUSH/WARMER DISC (DRAPES) IMPLANT
DRAPE WARM FLUID 44X44 (DRAPES) ×1 IMPLANT
DRSG AQUACEL AG ADV 3.5X14 (GAUZE/BANDAGES/DRESSINGS) ×1 IMPLANT
DRSG PAD ABDOMINAL 8X10 ST (GAUZE/BANDAGES/DRESSINGS) IMPLANT
DRSG VAC ATS LRG SENSATRAC (GAUZE/BANDAGES/DRESSINGS) ×1 IMPLANT
DRSG VAC ATS MED SENSATRAC (GAUZE/BANDAGES/DRESSINGS) ×1 IMPLANT
DRSG VAC ATS SM SENSATRAC (GAUZE/BANDAGES/DRESSINGS) ×2 IMPLANT
ELECT REM PT RETURN 9FT ADLT (ELECTROSURGICAL) ×2
ELECT SOLID GEL RDN PRO-PADZ (MISCELLANEOUS) ×2
ELECTRODE REM PT RTRN 9FT ADLT (ELECTROSURGICAL) ×1 IMPLANT
ELECTRODE SOLI GEL RDN PROPADZ (MISCELLANEOUS) IMPLANT
GAUZE 4X4 16PLY ~~LOC~~+RFID DBL (SPONGE) ×2 IMPLANT
GAUZE SPONGE 4X4 12PLY STRL (GAUZE/BANDAGES/DRESSINGS) ×2 IMPLANT
GAUZE XEROFORM 5X9 LF (GAUZE/BANDAGES/DRESSINGS) IMPLANT
GLOVE SURG ENC MOIS LTX SZ7.5 (GLOVE) ×4 IMPLANT
GLOVE SURG GAMMEX LF SZ7 (GLOVE) ×1 IMPLANT
GLOVE SURG UNDER POLY LF SZ6.5 (GLOVE) ×1 IMPLANT
GOWN STRL REUS W/ TWL LRG LVL3 (GOWN DISPOSABLE) ×4 IMPLANT
GOWN STRL REUS W/TWL LRG LVL3 (GOWN DISPOSABLE) ×4
HANDPIECE INTERPULSE COAX TIP (DISPOSABLE) ×1
HEMOSTAT POWDER SURGIFOAM 1G (HEMOSTASIS) IMPLANT
HEMOSTAT SURGICEL 2X14 (HEMOSTASIS) IMPLANT
KIT BASIN OR (CUSTOM PROCEDURE TRAY) ×2 IMPLANT
KIT SUCTION CATH 14FR (SUCTIONS) IMPLANT
KIT TURNOVER KIT B (KITS) ×2 IMPLANT
NS IRRIG 1000ML POUR BTL (IV SOLUTION) ×2 IMPLANT
PACK CHEST (CUSTOM PROCEDURE TRAY) ×1 IMPLANT
PACK GENERAL/GYN (CUSTOM PROCEDURE TRAY) ×3 IMPLANT
PAD ARMBOARD 7.5X6 YLW CONV (MISCELLANEOUS) ×4 IMPLANT
SET HNDPC FAN SPRY TIP SCT (DISPOSABLE) ×1 IMPLANT
SOL PREP POV-IOD 4OZ 10% (MISCELLANEOUS) ×1 IMPLANT
SPONGE T-LAP 18X18 ~~LOC~~+RFID (SPONGE) ×7 IMPLANT
SPONGE T-LAP 4X18 ~~LOC~~+RFID (SPONGE) ×1 IMPLANT
STAPLER VISISTAT 35W (STAPLE) IMPLANT
SUT ETHILON 3 0 FSL (SUTURE) IMPLANT
SUT STEEL 6MS V (SUTURE) IMPLANT
SUT STEEL STERNAL CCS#1 18IN (SUTURE) IMPLANT
SUT STEEL SZ 6 DBL 3X14 BALL (SUTURE) IMPLANT
SUT VIC AB 1 CTX 36 (SUTURE) ×2
SUT VIC AB 1 CTX36XBRD ANBCTR (SUTURE) ×2 IMPLANT
SUT VIC AB 2-0 CTX 27 (SUTURE) ×4 IMPLANT
SUT VIC AB 3-0 X1 27 (SUTURE) ×4 IMPLANT
SWAB COLLECTION DEVICE MRSA (MISCELLANEOUS) ×1 IMPLANT
SWAB CULTURE ESWAB REG 1ML (MISCELLANEOUS) ×1 IMPLANT
SYR 5ML LL (SYRINGE) IMPLANT
TOWEL GREEN STERILE (TOWEL DISPOSABLE) ×2 IMPLANT
TOWEL GREEN STERILE FF (TOWEL DISPOSABLE) ×2 IMPLANT
TRAY FOLEY MTR SLVR 16FR STAT (SET/KITS/TRAYS/PACK) IMPLANT
WATER STERILE IRR 1000ML POUR (IV SOLUTION) ×2 IMPLANT

## 2021-03-02 NOTE — Progress Notes (Signed)
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  1/23 Bcx negative 1/23 Wound swab cx with mssa  1/24 tte no evidence valve vegetation 1/24 ct c/a/p stranding around anterior abdomen where exam showed sinus tract  Patient is getting debridement today 1/27  PLAN: Continue cefazolin F/u operative finding Anticipate 2-4 weeks iv abx then chronic suppression oral abx Discussed with primary team  I spent more than 35 minute reviewing data/chart, and coordinating care and >50% direct face to face time providing counseling/discussing diagnostics/treatment plan with patient   Principal Problem:   Infection associated with driveline of left ventricular assist device (LVAD) (HCC)   No Known Allergies  Scheduled Meds:  [MAR Hold] sildenafil  20 mg Oral TID   Continuous Infusions:  [MAR Hold]  ceFAZolin (ANCEF) IV 2 g (03/02/21 0618)    ceFAZolin (ANCEF) IV     heparin Stopped (03/02/21 1457)   lactated ringers     PRN Meds:.[MAR Hold] acetaminophen, [MAR Hold] ondansetron (ZOFRAN) IV   SUBJECTIVE: Doing well No n/v/diarrhea No f/c Surgery this afternoon Tolerating cefazolin  Review of Systems: ROS All other ROS was negative, except mentioned above     OBJECTIVE: Vitals:   03/02/21 0715 03/02/21 1100 03/02/21 1200 03/02/21 1515  BP:  90/60  120/74  Pulse:    73  Resp:  18  18  Temp:  98.3 F (36.8 C)    TempSrc:  Oral  Oral  SpO2: 96%  94% 100%  Weight:      Height:       Body mass index is 18.65 kg/m.  Physical Exam General/constitutional: no distress, pleasant HEENT: Normocephalic, PER, Conj Clear, EOMI, Oropharynx clear Neck supple CV: rrr no mrg Lungs: clear to auscultation, normal respiratory  effort Abd: Soft, Nontender Ext: no edema Skin: sinus tract with purulence around exit site drive wire. No tenderness/fluctuance/cellulitic change Neuro: nonfocal MSK: no peripheral joint swelling/tenderness/warmth; back spines nontender    Lab Results Lab Results  Component Value Date   WBC 4.8 03/02/2021   HGB 8.3 (L) 03/02/2021   HCT 25.4 (L) 03/02/2021   MCV 92.4 03/02/2021   PLT 178 03/02/2021    Lab Results  Component Value Date   CREATININE 0.66 03/02/2021   BUN <5 (L) 03/02/2021   NA 135 03/02/2021   K 3.7 03/02/2021   CL 103 03/02/2021   CO2 24 03/02/2021    Lab Results  Component Value Date   ALT 25 02/26/2021   AST 45 (H) 02/26/2021   ALKPHOS 59 02/26/2021   BILITOT 0.4 02/26/2021      Microbiology: Recent Results (from the past 240 hour(s))  Aerobic Culture w Gram Stain (superficial specimen)     Status: None   Collection Time: 02/26/21 11:35 AM   Specimen: Abdomen; Wound  Result Value Ref Range Status   Specimen Description ABDOMEN  Final   Special Requests LVAD DRIVE LINE  Final   Gram Stain   Final    FEW WBC PRESENT, PREDOMINANTLY MONONUCLEAR FEW GRAM POSITIVE COCCI RARE GRAM POSITIVE RODS    Culture   Final    FEW STAPHYLOCOCCUS AUREUS SUSCEPTIBILITIES PERFORMED ON PREVIOUS CULTURE WITHIN THE LAST 5 DAYS. Performed at [facility] Lab, [address] [place], [place] [address]    Report Status 02/28/2021 FINAL  Final  Aerobic Culture w Gram Stain (superficial specimen)     Status: None   Collection Time: 02/26/21 11:35 AM   Specimen: Abscess; Wound  Result Value Ref Range Status   Specimen Description ABSCESS  Final   Special Requests ABDOMEN  Final   Gram Stain   Final    NO WBC SEEN NO ORGANISMS SEEN Performed at [facility], [address], [place], [place] [address]    Culture FEW STAPHYLOCOCCUS AUREUS  Final   Report Status 02/28/2021 FINAL  Final   Organism ID, Bacteria STAPHYLOCOCCUS AUREUS  Final       Susceptibility   Staphylococcus aureus - MIC*    CIPROFLOXACIN 4 RESISTANT Resistant     ERYTHROMYCIN >=8 RESISTANT Resistant     GENTAMICIN <=0.5 SENSITIVE Sensitive     OXACILLIN 0.5 SENSITIVE Sensitive     TETRACYCLINE <=1 SENSITIVE Sensitive     VANCOMYCIN <=0.5 SENSITIVE Sensitive     TRIMETH/SULFA <=10 SENSITIVE Sensitive     CLINDAMYCIN RESISTANT Resistant     RIFAMPIN <=0.5 SENSITIVE Sensitive     Inducible Clindamycin POSITIVE Resistant     * FEW STAPHYLOCOCCUS AUREUS  Culture, blood (single)     Status: None (Preliminary result)   Collection Time: 02/26/21 12:00 PM   Specimen: BLOOD RIGHT HAND  Result Value Ref Range Status   Specimen Description BLOOD RIGHT HAND  Final   Special Requests   Final    BOTTLES DRAWN AEROBIC AND ANAEROBIC Blood Culture results may not be optimal due to an inadequate volume of blood received in culture bottles   Culture   Final    NO GROWTH 4 DAYS Performed at [facility], [address] [place], [place] [address]    Report Status PENDING  Incomplete  Culture, blood (single)     Status: None (Preliminary result)   Collection Time: 02/26/21 12:20 PM   Specimen: BLOOD  Result Value Ref Range Status   Specimen Description BLOOD LEFT ANTECUBITAL  Final   Special Requests   Final    BOTTLES DRAWN AEROBIC AND ANAEROBIC Blood Culture results may not be optimal due to an inadequate volume of blood received in culture bottles   Culture   Final    NO GROWTH 4 DAYS Performed at [facility], [address] [place], [place] [address]    Report Status PENDING  Incomplete  MRSA Next Gen by PCR, Nasal     Status: None   Collection Time: 02/26/21  2:52 PM   Specimen: Nasal Mucosa; Nasal Swab  Result Value Ref Range Status   MRSA by PCR Next Gen NOT DETECTED NOT DETECTED Final    Comment: (NOTE) The GeneXpert MRSA Assay (FDA approved for NASAL specimens only), is one component of a comprehensive MRSA colonization surveillance program.  It is not intended to diagnose MRSA infection nor to guide or monitor treatment for MRSA infections. Test performance is not FDA approved in patients less than 61 years old. Performed at [facility] Lab, [address] [place], [place] [address]   Resp Panel by RT-PCR (Flu A&B, Covid) Nasal Mucosa     Status: None   Collection Time: 02/26/21  3:01 PM   Specimen: Nasal Mucosa; Nasopharyngeal(NP) swabs in vial transport medium  Result Value Ref Range Status   SARS Coronavirus 2 by RT PCR NEGATIVE NEGATIVE Final  Comment: (NOTE) SARS-CoV-2 target nucleic acids are NOT DETECTED.  The SARS-CoV-2 RNA is generally detectable in upper respiratory specimens during the acute phase of infection. The lowest concentration of SARS-CoV-2 viral copies this assay can detect is 138 copies/mL. A negative result does not preclude SARS-Cov-2 infection and should not be used as the sole basis for treatment or other patient management decisions. A negative result may occur with  improper specimen collection/handling, submission of specimen other than nasopharyngeal swab, presence of viral mutation(s) within the areas targeted by this assay, and inadequate number of viral copies(<138 copies/mL). A negative result must be combined with clinical observations, patient history, and epidemiological information. The expected result is Negative.  Fact Sheet for Patients:  BloggerCourse.com  Fact Sheet for Healthcare Providers:  SeriousBroker.it  This test is no t yet approved or cleared by the [place] FDA and  has been authorized for detection and/or diagnosis of SARS-CoV-2 by FDA under an Emergency Use Authorization (EUA). This EUA will remain  in effect (meaning this test can be used) for the duration of the COVID-19 declaration under Section 564(b)(1) of the Act, 21 U.S.C.section 360bbb-3(b)(1), unless the authorization is terminated  or revoked  sooner.       Influenza A by PCR NEGATIVE NEGATIVE Final   Influenza B by PCR NEGATIVE NEGATIVE Final    Comment: (NOTE) The Xpert Xpress SARS-CoV-2/FLU/RSV plus assay is intended as an aid in the diagnosis of influenza from Nasopharyngeal swab specimens and should not be used as a sole basis for treatment. Nasal washings and aspirates are unacceptable for Xpert Xpress SARS-CoV-2/FLU/RSV testing.  Fact Sheet for Patients: BloggerCourse.com  Fact Sheet for Healthcare Providers: SeriousBroker.it  This test is not yet approved or cleared by the [place] FDA and has been authorized for detection and/or diagnosis of SARS-CoV-2 by FDA under an Emergency Use Authorization (EUA). This EUA will remain in effect (meaning this test can be used) for the duration of the COVID-19 declaration under Section 564(b)(1) of the Act, 21 U.S.C. section 360bbb-3(b)(1), unless the authorization is terminated or revoked.  Performed at [facility] Lab, [address], [place], [place] [address]   Surgical pcr screen     Status: None   Collection Time: 03/02/21  6:10 AM   Specimen: Nasal Mucosa; Nasal Swab  Result Value Ref Range Status   MRSA, PCR NEGATIVE NEGATIVE Final   Staphylococcus aureus NEGATIVE NEGATIVE Final    Comment: (NOTE) The Xpert SA Assay (FDA approved for NASAL specimens in patients 48 years of age and older), is one component of a comprehensive surveillance program. It is not intended to diagnose infection nor to guide or monitor treatment. Performed at [facility], [address] [place], [place] [address]      Serology:   Imaging: If present, new imagings (plain films, ct scans, and mri) have been personally visualized and interpreted; radiology reports have been reviewed. Decision making incorporated into the Impression / Recommendations.  1/24 tte  1. Limited study for LVAD Assessment.      Parameters  not entered, study assumes most recent documented LVAD  parameters:      Flow 3.5 liters/min, speed 5400, power 3.8, PI 4.6      Inflow cannula visualized without Doppler interrogation.      Outflow cannular visualized without Doppler interrogation. Left  ventricular ejection fraction, by estimation, is 40 to 45%. The left  ventricle has mildly decreased function. The left ventricle demonstrates  global hypokinesis. Neutral  septum.   2. Right ventricular systolic function is mildly reduced with basal  function largely preserved. The right ventricular size is severely  enlarged.   3. The mitral valve is normal in structure. Mild to moderate mitral valve  regurgitation. No evidence of mitral stenosis.   4. The aortic valve is tricuspid. Aortic valve regurgitation is not  visualized. Aortic valve opens consistently.   1/24 ct abd pelv chest There is skin thickening and subcutaneous stranding in the anterior abdominal wall slightly above the umbilicus adjacent to lead extending to the left ventricular assist device. Findings suggest possible cellulitis. There is no demonstrable loculated fluid collection in this region.   There is no focal pulmonary consolidation. There is no pleural effusion. No significant lymphadenopathy seen in mediastinum.   There is no evidence of intestinal obstruction or pneumoperitoneum. Appendix is not dilated. There is no hydronephrosis. There are no abnormal loculated fluid collections in the abdomen and pelvis.   There is diffuse wall thickening in the fundus of the stomach which may be due to incomplete distention or gastritis or neoplastic process.   Raymondo Band, MD [facility] [phone] pager    03/02/2021, 4:16 PM

## 2021-03-02 NOTE — Progress Notes (Signed)
ANTICOAGULATION CONSULT NOTE  Pharmacy Consult for heparin Indication:  LVAD  No Known Allergies  Patient Measurements: Height: 5\' 6"  (167.6 cm) Weight: 52.4 kg (115 lb 8.3 oz) IBW/kg (Calculated) : 63.8 Heparin Dosing Weight: 52kg  Vital Signs: Temp: 98.7 F (37.1 C) (01/27 0702) Temp Source: Oral (01/27 0702) BP: 100/74 (01/27 0702) Pulse Rate: 68 (01/27 0702)  Labs: Recent Labs    02/28/21 0124 02/28/21 1411 03/01/21 0113 03/02/21 0059  HGB 9.3*  --  8.9* 8.3*  HCT 28.4*  --  26.6* 25.4*  PLT 152  --  175 178  LABPROT 17.0*  --  13.8 13.5  INR 1.4*  --  1.1 1.0  HEPARINUNFRC  --  <0.10* <0.10* <0.10*  CREATININE 0.76  --  0.78 0.66     Estimated Creatinine Clearance: 95.5 mL/min (by C-G formula based on SCr of 0.66 mg/dL).   Medical History: Past Medical History:  Diagnosis Date   Dilated cardiomyopathy (HCC)    Dilated cardiomyopathy (HCC) 02/2019   Polysubstance abuse (HCC)     Assessment: 61 yoM with HM3 LVAD on chronic warfarin admitted with DLI. Warfarin held with need for I&D in OR scheduled for 1/27.   INR subtherapeutic, heparin level undetectable as expected, CBC and LDH stable. Planning I&D in OR today - will follow-up anticoagulaiton plan postop.  Goal of Therapy:  Heparin level <0.3 units/ml Monitor platelets by anticoagulation protocol: Yes   Plan:  Continue to hold warfarin Heparin 400 units/h - no bolus Daily heparin level and CBC   2/27, PharmD, [place], [facility] Clinical Pharmacist [phone] Please check AMION for all [facility] Pharmacy numbers 03/02/2021

## 2021-03-02 NOTE — Progress Notes (Signed)
Transported to short stay by bed awake and alert. 

## 2021-03-02 NOTE — Anesthesia Preprocedure Evaluation (Addendum)
Anesthesia Evaluation  Patient identified by MRN, date of birth, ID band Patient awake    Reviewed: Allergy & Precautions, NPO status , Patient's Chart, lab work & pertinent test results  Airway Mallampati: I  TM Distance: >3 FB Neck ROM: Full    Dental  (+) Poor Dentition, Chipped, Teeth Intact, Dental Advisory Given,    Pulmonary former smoker,    Pulmonary exam normal breath sounds clear to auscultation       Cardiovascular +CHF  Normal cardiovascular exam+ Valvular Problems/Murmurs MR  Rhythm:Regular Rate:Normal  LVAD in place  Echo 02/27/2021 1. Limited study for LVAD Assessment.   Parameters not entered, study assumes most recent documented LVAD parameters:   Flow 3.5 liters/min, speed 5400, power 3.8, PI 4.6   Inflow cannula visualized without Doppler interrogation.   Outflow cannular visualized without Doppler interrogation. Left ventricular ejection fraction, by estimation, is 40 to 45%. The left ventricle has mildly decreased function. The left ventricle demonstrates  global hypokinesis. Neutral septum.  2. Right ventricular systolic function is mildly reduced with basal function largely preserved. The right ventricular size is severely enlarged.  3. The mitral valve is normal in structure. Mild to moderate mitral valve regurgitation. No evidence of mitral stenosis.  4. The aortic valve is tricuspid. Aortic valve regurgitation is not visualized. Aortic valve opens consistently.    Neuro/Psych negative neurological ROS  negative psych ROS   GI/Hepatic negative GI ROS, Neg liver ROS,   Endo/Other  negative endocrine ROS  Renal/GU Renal disease     Musculoskeletal negative musculoskeletal ROS (+)   Abdominal Normal abdominal exam  (+)   Peds  Hematology negative hematology ROS (+) anemia ,   Anesthesia Other Findings   Reproductive/Obstetrics                            Anesthesia Physical  Anesthesia Plan  ASA: 4  Anesthesia Plan: General   Post-op Pain Management: Minimal or no pain anticipated   Induction: Intravenous  PONV Risk Score and Plan: 3 and Ondansetron, Midazolam, Treatment may vary due to age or medical condition and Dexamethasone  Airway Management Planned: Oral ETT  Additional Equipment:   Intra-op Plan:   Post-operative Plan: Extubation in OR  Informed Consent: I have reviewed the patients History and Physical, chart, labs and discussed the procedure including the risks, benefits and alternatives for the proposed anesthesia with the patient or authorized representative who has indicated his/her understanding and acceptance.     Dental advisory given  Plan Discussed with: CRNA  Anesthesia Plan Comments:       Anesthesia Quick Evaluation

## 2021-03-02 NOTE — Progress Notes (Signed)
Pre Procedure note for inpatients:   Jon Martin has been scheduled for Procedure(s): VAD DRIVELINE WOUND DEBRIDEMENT (N/A) APPLICATION OF WOUND VAC (N/A) today. The various methods of treatment have been discussed with the patient. After consideration of the risks, benefits and treatment options the patient has consented to the planned procedure.   The patient has been seen and labs reviewed. There are no changes in the patients condition to prevent proceeding with the planned procedure today.  Recent labs:  Lab Results  Component Value Date   WBC 4.8 03/02/2021   HGB 8.3 (L) 03/02/2021   HCT 25.4 (L) 03/02/2021   PLT 178 03/02/2021   GLUCOSE 98 03/02/2021   CHOL 89 02/11/2019   TRIG 58 02/11/2019   HDL 25 (L) 02/11/2019   LDLCALC 52 02/11/2019   ALT 25 02/26/2021   AST 45 (H) 02/26/2021   NA 135 03/02/2021   K 3.7 03/02/2021   CL 103 03/02/2021   CREATININE 0.66 03/02/2021   BUN <5 (L) 03/02/2021   CO2 24 03/02/2021   TSH 1.862 01/18/2020   INR 1.0 03/02/2021   HGBA1C 5.2 02/11/2019    [name], MD 03/02/2021 5:12 PM

## 2021-03-02 NOTE — Progress Notes (Signed)
LVAD Coordinator Rounding Note:  Admitted 02/26/21 due to Dr. Clayborne Dana service for VAD drive line infection.   HM III LVAD implanted on 02/26/19 by Dr. Darcey Nora under Destination Therapy criteria.  Patient laying in bed watching TV. Denies complaints. Plan for OR this afternoon with Dr Prescott Gum for drive line debridement. Pt is NPO for procedure.   Patient understands plan for debridement, no questions at this time.   Wound cultures with growth of staph aureus. Will obtain cultures in OR on today. Antibiotic management per ID team: Vancomycin 1000 mg IV every 12 hours.   Pt may need placement at SNF in [place], [place] at discharge if discharged with wound vac and/or IV antibiotics. [facility] contact information provided to inpatient CSW and case manager yesterday. (See note from 02/28/21 for contact info)  Vital signs: Temp: 98.3 HR: 89 Doppler Pressure: 80 Automatic BP: 100/74 (81) O2 Sat: 96% RA  Wt: 111.9>115.3>115.5>115  lbs  LVAD interrogation reveals:  Speed: 5400 Flow: 3.8 Power: 3.8w PI: 4.6 Hct: 20 - DO NOT CHANGE   Alarms: none Events: 40 PI events 03/01/21  Fixed speed: 5400 Low speed limit: 5100  Drive Line:  Existing VAD C/D/I with anchor intact and accurately applied. Daily dressing changes per bedside RN or VAD coordinator. Next dressing change will be in the OR later today.    Abdominal Abcsess: Dressing C/D/I. Daily dressing changes per bedside RN or VAD coordinator. Next dressing will be in the OR today.    Labs:  LDH trend: 154>145>172>163  INR trend: 2.3>1.4>1.1>1.0  Hgb trend: 7.0>8.4>9.3>8.9>8.3  Anticoagulation Plan: -INR Goal: 2.0 - 2.5 -ASA Dose: none  Blood Products:  - 02/26/21>>1 units PCs  Device: N/A  Infection:  02/26/21 Drive line wound culture>> few staph aureus; final  02/26/21 Abdominal abscess culture>> few staph aureus; final  02/26/21 Blood cultures >> no growth 3 days; final pending   Drips:  Heparin 400  units/hr  Plan/Recommendations:  1. Call VAD Coordinator if any VAD equipment or drive line issues. 2. Daily drive line dressing changes per bedside RN or VAD coordinator 3. VAD coordinator will accompany pt to OR today for drive line debridement with Dr Prescott Gum.   Zada Girt RN [place] Coordinator  Office: [phone]  24/7 Pager: [phone]

## 2021-03-02 NOTE — Progress Notes (Signed)
VAD Coordinator Procedure Note:   VAD Coordinator met patient in holding room. Pt undergoing VAD drive line debridement per Dr. Darcey Nora. Hemodynamics and VAD parameters monitored by myself and anesthesia throughout the procedure. Blood pressures were obtained with automatic cuff on right arm.    Time: Doppler Auto  BP Flow PI Power Speed  Pre-procedure:           15:15  120/74 (91) 4.0 4.1 3.9 5400           Secation Induction:          18:00  95/73 (82) 3.8 3.4 3.7    18:15  100/78 (85) 3.8 3.2 3.7    18:30  90/79 (80) 4.4 3.9 3.9    18:45  100/87 (94) 4.2 4.0 3.9    19:00  105/87 (94) 4.0 4.0 3.9   Recovery Area:          19:15  96/61 (72) 4.0 4.0 3.9    19:30  109/81 (85) 3.9 4.0 3.9     Patient tolerated the procedure well. VAD Coordinator accompanied and remained with patient in recovery area.   Will hold heparin gtt tonight per Dr. Darcey Nora.   Patient Disposition: [place] RN, [place] Coordinator [phone]

## 2021-03-02 NOTE — Anesthesia Procedure Notes (Signed)
Procedure Name: LMA Insertion Date/Time: 03/02/2021 6:11 PM Performed by: Zollie Beckers, CRNA Pre-anesthesia Checklist: Patient identified, Emergency Drugs available, Suction available and Patient being monitored Patient Re-evaluated:Patient Re-evaluated prior to induction Oxygen Delivery Method: Circle System Utilized Preoxygenation: Pre-oxygenation with 100% oxygen Induction Type: IV induction Ventilation: Mask ventilation without difficulty LMA: LMA inserted LMA Size: 4.0 Number of attempts: 1 Placement Confirmation: positive ETCO2 Tube secured with: Tape Dental Injury: Teeth and Oropharynx as per pre-operative assessment

## 2021-03-02 NOTE — Brief Op Note (Signed)
03/02/2021  7:04 PM  PATIENT:  Jon Martin  36 y.o. male  PRE-OPERATIVE DIAGNOSIS:  VAD DRIVELINE INFECTION  POST-OPERATIVE DIAGNOSIS:  VAD DRIVELINE INFECTION  PROCEDURE:  Procedure(s): VAD DRIVELINE WOUND DEBRIDEMENT (N/A) APPLICATION OF WOUND VAC (N/A)  SURGEON:  Surgeon(s) and Role:    [name], MD - Primary  PHYSICIAN ASSISTANT: RNFA  ASSISTANTS:    ANESTHESIA:   general  EBL:  5 ml  BLOOD ADMINISTERED:none  DRAINS: none   Small wound VAC sponge  LOCAL MEDICATIONS USED:  NONE  SPECIMEN:  Source of Specimen:  excisional deep tissue  DISPOSITION OF SPECIMEN:   microbiology  COUNTS:  YES  TOURNIQUET:  * No tourniquets in log *  DICTATION: .Dragon Dictation  PLAN OF CARE:  return to 2 C  PATIENT DISPOSITION:  PACU - hemodynamically stable.   Delay start of Pharmacological VTE agent (>24hrs) due to surgical blood loss or risk of bleeding: yes - resume heparin per pharmacy in am

## 2021-03-02 NOTE — Progress Notes (Addendum)
Advanced Heart Failure VAD Team Note  PCP-Cardiologist: Dr. Haroldine Laws   Subjective:    1/23: Admitted for recurrent DL infection - WCx + staph aureus. BCx NGTD. Abd CT tranding in the anterior abdominal wall slightly above the umbilicus adjacent to lead extending to the left ventricular assist device, suggestive of possible cellulitis. No loculated fluid collection.  1/23: Transfused 1uRBC Hgb 7.0  ID following, abx narrowed to just vanc.  Blood cx NGTD.   Denies SOB. Denies pain.     LVAD INTERROGATION:  HeartMate III LVAD:   Flow 4 liters/min, speed 5400 power 4, PI 3.5 ,  < 10 PI events.  Objective:    Vital Signs:   Temp:  [98.3 F (36.8 C)-98.9 F (37.2 C)] 98.7 F (37.1 C) (01/27 0702) Pulse Rate:  [68-86] 68 (01/27 0702) Resp:  [11-19] 14 (01/27 0702) BP: (77-101)/(58-83) 100/74 (01/27 0702) SpO2:  [98 %-100 %] 100 % (01/27 0439) Last BM Date: 03/01/21 Mean arterial Pressure 70s-80s  Intake/Output:   Intake/Output Summary (Last 24 hours) at 03/02/2021 0713 Last data filed at 03/02/2021 0615 Gross per 24 hour  Intake 760.6 ml  Output 1500 ml  Net -739.4 ml     Physical Exam     Physical Exam: GENERAL: No acute distress. HEENT: normal  NECK: Supple, JVP flat  .  2+ bilaterally, no bruits.  No lymphadenopathy or thyromegaly appreciated.   CARDIAC:  Mechanical heart sounds with LVAD hum present.  LUNGS:  Clear to auscultation bilaterally.  ABDOMEN:  Soft, round, nontender, positive bowel sounds x4.     LVAD exit site:Dressing dry and intact.  No erythema or drainage.  Stabilization device present and accurately applied.  Driveline dressing is being changed daily per sterile technique. EXTREMITIES:  Warm and dry, no cyanosis, clubbing, rash or edema  NEUROLOGIC:  Alert and oriented x 3.    No aphasia.  No dysarthria.  Affect pleasant.     Telemetry   SR 70-80s personally reviewed  EKG   N/A   Labs   Basic Metabolic Panel: Recent Labs  Lab  02/26/21 1414 02/27/21 0552 02/28/21 0124 03/01/21 0113 03/02/21 0059  NA 138 134* 131* 134* 135  K 3.4* 3.5 3.6 3.5 3.7  CL 105 103 101 101 103  CO2 21* 23 21* 24 24  GLUCOSE 86 89 101* 104* 98  BUN 6 7 <5* <5* <5*  CREATININE 0.76 0.78 0.76 0.78 0.66  CALCIUM 8.6* 8.6* 9.1 8.9 8.6*  MG 1.9  --   --  1.6* 2.0    Liver Function Tests: Recent Labs  Lab 02/26/21 1414  AST 45*  ALT 25  ALKPHOS 59  BILITOT 0.4  PROT 7.4  ALBUMIN 3.8   No results for input(s): LIPASE, AMYLASE in the last 168 hours. No results for input(s): AMMONIA in the last 168 hours.  CBC: Recent Labs  Lab 02/26/21 1414 02/27/21 0552 02/28/21 0124 03/01/21 0113 03/02/21 0059  WBC 5.4 4.8 4.6 5.6 4.8  HGB 7.0* 8.4* 9.3* 8.9* 8.3*  HCT 21.8* 25.4* 28.4* 26.6* 25.4*  MCV 94.4 92.0 91.6 91.4 92.4  PLT 173 146* 152 175 178    INR: Recent Labs  Lab 02/26/21 1414 02/27/21 0552 02/28/21 0124 03/01/21 0113 03/02/21 0059  INR 3.0* 2.3* 1.4* 1.1 1.0    Other results: EKG:    Imaging   No results found.   Medications:     Scheduled Medications:  sildenafil  20 mg Oral TID    Infusions:  ceFAZolin (ANCEF) IV 2 g (03/02/21 0618)    ceFAZolin (ANCEF) IV     heparin 400 Units/hr (03/02/21 0439)    PRN Medications: acetaminophen, ondansetron (ZOFRAN) IV   Patient Profile   36 y/o w/ chronic systolic heart failure, NICM, s/p MH3 LVAD w/ prior history of MSSA DL infection, being admitted for recurrent DL infection.   Assessment/Plan:    1. Recurrent DL Infection  - previous MSSA DL infection 3/22, treated w/ surgical debridement and IV abx - Now w/ recurrent infection. Wound Cx  + staph aureus. BCx NGTD  - CT of C/A/P w/ stranding in the anterior abdominal wall slightly above the umbilicus adjacent to lead extending to the left ventricular assist device, suggestive of possible cellulitis. No loculated fluid collection.  - Abx narrowed cefepime - Plan wound debridement  today  per Dr. Nils Pyle with deep cultures.  - Holding Coumadin, INR 1.0 today. On heparin gtt per Pharmacy - ID following, appreciate their assistance    2. Chronic systolic HF due to severe NICM - Echo EF 10% with biventricular failure - HM-3 VAD implant 02/26/19 - Echo 7/21 EF 40-45% - Volume status stable.  - Maps stable.  - Continue sildenafil - Off coreg and losartan.    3. VAD - Admit 12/21 with multiple low flow alarms. Rising LDH and decreasing PLTs. Suspected outflow graft kink occlusion. Patient placed on his left side and resolved.  - VAD interrogated personally. Parameters stable. - Treating for DL infection per above  - Daily dressing changes  - Holding Coumadin for wound debridement, INR 1.0 On heparin gtt LDH stable.   4. HTN - Stable.  - Off home Coreg and Losartan  5. Anemia - Admit Hgb 7.0 - Transfused 1 u RBC 1/23 - Hgb 8.3today  - Follow H/H   6. Hypokalemia - 3.7 today after K supp.  To OR today.   I reviewed the LVAD parameters from today, and compared the results to the patient's prior recorded data.  No programming changes were made.  The LVAD is functioning within specified parameters.  The patient performs LVAD self-test daily.  LVAD interrogation was negative for any significant power changes, alarms or PI events/speed drops.  LVAD equipment check completed and is in good working order.  Back-up equipment present.   LVAD education done on emergency procedures and precautions and reviewed exit site care.  Length of Stay: 4  Darrick Grinder, NP 03/02/2021, 7:13 AM  VAD Team --- VAD ISSUES ONLY--- Pager [phone] (7am - 7am)  Advanced Heart Failure Team  Pager [phone] (M-F; 7a - 5p)  Please contact [facility] for night-coverage after hours (5p -7a ) and weekends on [email or web address]  Patient seen and examined with the above-signed Advanced Practice Provider and/or Housestaff. I personally reviewed laboratory data, imaging studies and relevant notes. I  independently examined the patient and formulated the important aspects of the plan. I have edited the note to reflect any of my changes or salient points. I have personally discussed the plan with the patient and/or family.  Feels ok. ID has narrowed abx to Vanc only. No fevers or chills. On heparin. No bleeding   General:  NAD.  HEENT: normal  Neck: supple. JVP not elevated.  Carotids 2+ bilat; no bruits. No lymphadenopathy or thryomegaly appreciated. Cor: LVAD hum.  Lungs: Clear. Abdomen: soft, nontender, non-distended. No hepatosplenomegaly. No bruits or masses. Good bowel sounds. Driveline site with large dressing . Anchor in place.  Extremities: no cyanosis, clubbing,  rash. Warm no edema  Neuro: alert & oriented x 3. No focal deficits. Moves all 4 without problem   Plan OR today for I&D and vac placement. Continue IV abx. Continue heparin. Will await results of OR to see if we can resume warfarin or wait due to need to go back to OR. Discussed dosing with PharmD personally. HF stable.   VAD interrogated personally. Parameters stable.  Glori Bickers, MD  8:28 AM

## 2021-03-02 NOTE — Transfer of Care (Signed)
Immediate Anesthesia Transfer of Care Note  Patient: Jon Martin  Procedure(s) Performed: VAD DRIVELINE WOUND DEBRIDEMENT APPLICATION OF WOUND VAC  Patient Location: PACU  Anesthesia Type:General  Level of Consciousness: awake, alert  and oriented  Airway & Oxygen Therapy: Patient Spontanous Breathing  Post-op Assessment: Report given to RN and Post -op Vital signs reviewed and stable  Post vital signs: Reviewed and stable  Last Vitals:  Vitals Value Taken Time  BP    Temp    Pulse    Resp    SpO2      Last Pain:  Vitals:   03/02/21 1515  TempSrc: Oral  PainSc:          Complications: No notable events documented.

## 2021-03-03 DIAGNOSIS — T827XXA Infection and inflammatory reaction due to other cardiac and vascular devices, implants and grafts, initial encounter: Secondary | ICD-10-CM | POA: Diagnosis not present

## 2021-03-03 LAB — CULTURE, BLOOD (SINGLE)
Culture: NO GROWTH
Culture: NO GROWTH

## 2021-03-03 LAB — BASIC METABOLIC PANEL
Anion gap: 9 (ref 5–15)
BUN: <5 mg/dL — ABNORMAL LOW (ref 6–20)
CO2: 21 mmol/L — ABNORMAL LOW (ref 22–32)
Calcium: 8.8 mg/dL — ABNORMAL LOW (ref 8.9–10.3)
Chloride: 104 mmol/L (ref 98–111)
Creatinine, Ser: 0.82 mg/dL (ref 0.61–1.24)
GFR, Estimated: >60 mL/min (ref >60)
Glucose, Bld: 187 mg/dL — ABNORMAL HIGH (ref 70–99)
Potassium: 4 mmol/L (ref 3.5–5.1)
Sodium: 134 mmol/L — ABNORMAL LOW (ref 135–145)

## 2021-03-03 LAB — CBC
HCT: 25.6 % — ABNORMAL LOW (ref 39.0–52.0)
Hemoglobin: 8.4 g/dL — ABNORMAL LOW (ref 13.0–17.0)
MCH: 31 pg (ref 26.0–34.0)
MCHC: 32.8 g/dL (ref 30.0–36.0)
MCV: 94.5 fL (ref 80.0–100.0)
Platelets: 184 10*3/uL (ref 150–400)
RBC: 2.71 MIL/uL — ABNORMAL LOW (ref 4.22–5.81)
RDW: 17.8 % — ABNORMAL HIGH (ref 11.5–15.5)
WBC: 4.2 10*3/uL (ref 4.0–10.5)
nRBC: 0 % (ref 0.0–0.2)

## 2021-03-03 LAB — HEPARIN LEVEL (UNFRACTIONATED): Heparin Unfractionated: <0.1 IU/mL — ABNORMAL LOW (ref 0.30–0.70)

## 2021-03-03 LAB — PROTIME-INR
INR: 1 (ref 0.8–1.2)
Prothrombin Time: 12.9 seconds (ref 11.4–15.2)

## 2021-03-03 LAB — LACTATE DEHYDROGENASE: LDH: 140 U/L (ref 98–192)

## 2021-03-03 NOTE — Progress Notes (Signed)
Patient ID: Jon Martin, male   DOB: April 16, 1985, 36 y.o.   MRN: [id number]   Advanced Heart Failure VAD Team Note  PCP-Cardiologist: Dr. Gala Romney   Subjective:    1/23: Admitted for recurrent DL infection - WCx + staph aureus. BCx NGTD. Abd CT tranding in the anterior abdominal wall slightly above the umbilicus adjacent to lead extending to the left ventricular assist device, suggestive of possible cellulitis. No loculated fluid collection.  1/23: Transfused 1uRBC Hgb 7.0 1/27: Driveline site debridement, wound vac placed.   Patient remains on cefazolin for MSSA.  Afebrile, no complaints.   LVAD INTERROGATION:  HeartMate III LVAD:   Flow 3.8 liters/min, speed 5400 power 3.8, PI 3.5 ,  20 PI events.  Objective:    Vital Signs:   Temp:  [97.9 F (36.6 C)-98.8 F (37.1 C)] 98.8 F (37.1 C) (01/28 0740) Pulse Rate:  [73-99] 76 (01/28 1000) Resp:  [10-19] 19 (01/28 1000) BP: (75-120)/(43-84) 79/65 (01/28 1000) SpO2:  [90 %-100 %] 90 % (01/28 1000) Weight:  [53.8 kg] 53.8 kg (01/28 0353) Last BM Date: 03/01/21 Mean arterial Pressure 70s-80s  Intake/Output:   Intake/Output Summary (Last 24 hours) at 03/03/2021 1015 Last data filed at 03/03/2021 0858 Gross per 24 hour  Intake 2201.4 ml  Output 885 ml  Net 1316.4 ml     Physical Exam    General: Well appearing this am. NAD.  HEENT: Normal. Neck: Supple, JVP 7-8 cm. Carotids OK.  Cardiac:  Mechanical heart sounds with LVAD hum present.  Lungs:  CTAB, normal effort.  Abdomen:  NT, ND, no HSM. No bruits or masses. +BS  LVAD exit site: Wound vac at site.  Extremities:  Warm and dry. No cyanosis, clubbing, rash, or edema.  Neuro:  Alert & oriented x 3. Cranial nerves grossly intact. Moves all 4 extremities w/o difficulty. Affect pleasant     Telemetry   SR 70-80s personally reviewed  EKG   N/A   Labs   Basic Metabolic Panel: Recent Labs  Lab 02/26/21 1414 02/27/21 0552 02/28/21 0124 03/01/21 0113  03/02/21 0059 03/03/21 0101  NA 138 134* 131* 134* 135 134*  K 3.4* 3.5 3.6 3.5 3.7 4.0  CL 105 103 101 101 103 104  CO2 21* 23 21* 24 24 21*  GLUCOSE 86 89 101* 104* 98 187*  BUN 6 7 <5* <5* <5* <5*  CREATININE 0.76 0.78 0.76 0.78 0.66 0.82  CALCIUM 8.6* 8.6* 9.1 8.9 8.6* 8.8*  MG 1.9  --   --  1.6* 2.0  --     Liver Function Tests: Recent Labs  Lab 02/26/21 1414  AST 45*  ALT 25  ALKPHOS 59  BILITOT 0.4  PROT 7.4  ALBUMIN 3.8   No results for input(s): LIPASE, AMYLASE in the last 168 hours. No results for input(s): AMMONIA in the last 168 hours.  CBC: Recent Labs  Lab 02/27/21 0552 02/28/21 0124 03/01/21 0113 03/02/21 0059 03/03/21 0101  WBC 4.8 4.6 5.6 4.8 4.2  HGB 8.4* 9.3* 8.9* 8.3* 8.4*  HCT 25.4* 28.4* 26.6* 25.4* 25.6*  MCV 92.0 91.6 91.4 92.4 94.5  PLT 146* 152 175 178 184    INR: Recent Labs  Lab 02/27/21 0552 02/28/21 0124 03/01/21 0113 03/02/21 0059 03/03/21 0101  INR 2.3* 1.4* 1.1 1.0 1.0    Other results: EKG:    Imaging   No results found.   Medications:     Scheduled Medications:  sildenafil  20 mg Oral TID  Infusions:   ceFAZolin (ANCEF) IV 2 g (03/03/21 0622)   heparin Stopped (03/02/21 1451)    PRN Medications: acetaminophen, morphine injection, ondansetron (ZOFRAN) IV   Patient Profile   36 y/o w/ chronic systolic heart failure, NICM, s/p MH3 LVAD w/ prior history of MSSA DL infection, being admitted for recurrent DL infection.   Assessment/Plan:    1. Recurrent DL Infection  - previous MSSA DL infection 2/22, treated w/ surgical debridement and IV abx - Now w/ recurrent infection. Wound Cx  + MSSA. BCx NGTD  - CT of C/A/P w/ stranding in the anterior abdominal wall slightly above the umbilicus adjacent to lead extending to the left ventricular assist device, suggestive of possible cellulitis. No loculated fluid collection.  - Abx narrowed cefazolin, will need 2-4 wks then chronic suppression.  - Had wound  debridement 1/27 with wound vac placement, plan back to OR for vac change on Tuesday.  - Holding Coumadin, restart heparin gtt today.  - ID following, appreciate their assistance    2. Chronic systolic HF due to severe NICM - Echo EF 10% with biventricular failure - HM-3 VAD implant 02/26/19 - Echo 7/21 EF 40-45% - Volume status stable to low, multiple PI events overnight => encouraged po hydration.   - MAP stable.  - Continue sildenafil - Off coreg and losartan.    3. VAD - Admit 12/21 with multiple low flow alarms. Rising LDH and decreasing PLTs. Suspected outflow graft kink occlusion. Patient placed on his left side and resolved.  - VAD interrogated personally. Parameters stable. - Treating for DL infection per above  - Daily dressing changes  - Holding Coumadin for wound debridement, restart heparin gtt this morning (will need to go back to OR Tuesday).  - LDH stable.   4. HTN - Stable.  - Off home Coreg and Losartan  5. Anemia - Admit Hgb 7.0 - Transfused 1 u RBC 1/23 - Hgb 8.4 today  - Follow H/H   6. Hypokalemia - 4 today   I reviewed the LVAD parameters from today, and compared the results to the patient's prior recorded data.  No programming changes were made.  The LVAD is functioning within specified parameters.  The patient performs LVAD self-test daily.  LVAD interrogation was negative for any significant power changes, alarms or PI events/speed drops.  LVAD equipment check completed and is in good working order.  Back-up equipment present.   LVAD education done on emergency procedures and precautions and reviewed exit site care.  Length of Stay: 5  Marca Ancona, MD 03/03/2021, 10:15 AM  VAD Team --- VAD ISSUES ONLY--- Pager [phone] (7am - 7am)  Advanced Heart Failure Team  Pager [phone] (M-F; 7a - 5p)  Please contact [facility] for night-coverage after hours (5p -7a ) and weekends on [email or web address]  Patient seen and examined with the above-signed Advanced  Practice Provider and/or Housestaff. I personally reviewed laboratory data, imaging studies and relevant notes. I independently examined the patient and formulated the important aspects of the plan. I have edited the note to reflect any of my changes or salient points. I have personally discussed the plan with the patient and/or family.

## 2021-03-03 NOTE — Progress Notes (Addendum)
1 Day Post-Op Procedure(s) (LRB): VAD DRIVELINE WOUND DEBRIDEMENT (N/A) APPLICATION OF WOUND VAC (N/A) Subjective: Minimal pain at surgical site this a.m. Wound VAC compressed with minimal drainage Gram stain from operative debridement no organisms with few white cells Continue IV heparin and IV antibiotics. Wound VAC change with application of ACell in OR midweek  Objective: Vital signs in last 24 hours: Temp:  [97.9 F (36.6 C)-98.8 F (37.1 C)] 98.8 F (37.1 C) (01/28 0740) Pulse Rate:  [73-99] 75 (01/28 0800) Cardiac Rhythm: Normal sinus rhythm (01/28 0734) Resp:  [10-19] 17 (01/28 0800) BP: (75-120)/(43-84) 95/74 (01/28 0800) SpO2:  [94 %-100 %] 99 % (01/28 0800) Weight:  [53.8 kg] 53.8 kg (01/28 0353)  Hemodynamic parameters for last 24 hours:    Intake/Output from previous day: 01/27 0701 - 01/28 0700 In: 2093.4 [P.O.:716; I.V.:927.4; IV Piggyback:450] Out: 460 [Urine:450; Blood:10] Intake/Output this shift: Total I/O In: -  Out: 425 [Urine:400; Drains:25]  Normal VAD hum Good suction on wound VAC system  Lab Results: Recent Labs    03/02/21 0059 03/03/21 0101  WBC 4.8 4.2  HGB 8.3* 8.4*  HCT 25.4* 25.6*  PLT 178 184   BMET:  Recent Labs    03/02/21 0059 03/03/21 0101  NA 135 134*  K 3.7 4.0  CL 103 104  CO2 24 21*  GLUCOSE 98 187*  BUN <5* <5*  CREATININE 0.66 0.82  CALCIUM 8.6* 8.8*    PT/INR:  Recent Labs    03/03/21 0101  LABPROT 12.9  INR 1.0   ABG    Component Value Date/Time   PHART 7.424 03/01/2021 2302   HCO3 22.9 03/01/2021 2302   TCO2 27 03/02/2019 0358   ACIDBASEDEF 1.0 03/01/2021 2302   O2SAT 98.0 03/01/2021 2302   CBG (last 3)  No results for input(s): GLUCAP in the last 72 hours.  Assessment/Plan: S/P Procedure(s) (LRB): VAD DRIVELINE WOUND DEBRIDEMENT (N/A) APPLICATION OF WOUND VAC (N/A) Continue wound VAC care and plan to return to the OR for wound VAC change and application of ACell product Tuesday pm  Plan  starting oral Coumadin after that procedure.   LOS: 5 days    Jon Martin 03/03/2021

## 2021-03-03 NOTE — Op Note (Signed)
NAMEJAMAREE, Jon Martin MEDICAL RECORD NO: [id number] ACCOUNT NO: [id number] DATE OF BIRTH: [birth year] FACILITY: [facility] LOCATION: [facility] PHYSICIAN: Kerin Perna III, MD  Operative Report   DATE OF PROCEDURE: 03/02/2021  OPERATION: Excisional Debridement and pulse lavage irrigation of HeartMate 3 driveline tunnel infection with placement of wound VAC.  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  ANESTHESIA:  General.  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  He was admitted and placed on IV antibiotics and a CT scan of the abdomen showed fluid  collection in the tunnel up to the level of the umbilicus.  There was no evidence of involvement of the proximal power cord tunnel or the pump pocket.  I discussed the procedure of debridement of the area that was infected, excisional debridement and I  discussed the use of pulse lavage irrigation with antibiotics for the MSSA positive cultures.  I also discussed with him placement of wound VAC as wound management strategy.  He agreed to proceed with surgery.  DESCRIPTION OF PROCEDURE:  The patient was brought to the operating room and placed supine on the operating table.  Informed consent had been previously documented in the preoperative holding and final issues addressed with the patient in a face-to-face  meeting.  The patient was prepped and draped as a sterile field.  A proper time-out was performed.  The skin over the abscessed area was opened  down to the power cord extending from the umbilicus laterally to the exit site laterally.  Indurated  chronically infected tissue was excised sharply using electrocautery.  We cleaned out the entire region of the abnormal soft tissue.  The distal portion of the cord tunnel  was exposed with a skin incision.  More proximal to the pump, the driveline was incorporated well into the  tunnel without space or evidence of infection.  We then irrigated the open area with vancomycin pulse lavage irrigation.  We then placed a small wound VAC sponge in the cavity and covered it with the sterile sheets and connected to the suction pump.  The  patient was reversed from anesthesia and he remained stable and returned to the recovery room in good condition.     PAA D: 03/03/2021 5:33:28 pm T: 03/03/2021 10:37:00 pm  JOB: [id number]/ [id number]

## 2021-03-03 NOTE — Progress Notes (Signed)
ANTICOAGULATION CONSULT NOTE  Pharmacy Consult for heparin Indication:  LVAD  No Known Allergies  Patient Measurements: Height: 5\' 6"  (167.6 cm) Weight: 53.8 kg (118 lb 9.7 oz) IBW/kg (Calculated) : 63.8 Heparin Dosing Weight: 52kg  Vital Signs: Temp: 98.8 F (37.1 C) (01/28 0740) Temp Source: Oral (01/28 0300) BP: 79/65 (01/28 1000) Pulse Rate: 76 (01/28 1000)  Labs: Recent Labs    03/01/21 0113 03/02/21 0059 03/03/21 0101  HGB 8.9* 8.3* 8.4*  HCT 26.6* 25.4* 25.6*  PLT 175 178 184  LABPROT 13.8 13.5 12.9  INR 1.1 1.0 1.0  HEPARINUNFRC <0.10* <0.10* <0.10*  CREATININE 0.78 0.66 0.82     Estimated Creatinine Clearance: 95.7 mL/min (by C-G formula based on SCr of 0.82 mg/dL).   Medical History: Past Medical History:  Diagnosis Date   Dilated cardiomyopathy ([place])    Dilated cardiomyopathy ([place]) 02/2019   Polysubstance abuse ([place])     Assessment: 84 yoM with HM3 LVAD on chronic warfarin admitted with DLI.  S/p I&D 1/27, heparin held overnight.   INR normal at 1.0 with warfarin being held until after he goes back to OR next week. CBC and LDH stable. Orders to restart low dose heparin this morning. Expect levels to be undetectable, will recheck level in am.   Goal of Therapy:  Heparin level <0.3 units/ml Monitor platelets by anticoagulation protocol: Yes   Plan:  Heparin 400 units/h -set rate No warfarin for now Daily heparin level and CBC  Erin Hearing PharmD., BCPS Clinical Pharmacist 03/03/2021 10:33 AM

## 2021-03-04 ENCOUNTER — Other Ambulatory Visit: Payer: Self-pay

## 2021-03-04 DIAGNOSIS — T827XXA Infection and inflammatory reaction due to other cardiac and vascular devices, implants and grafts, initial encounter: Secondary | ICD-10-CM | POA: Diagnosis not present

## 2021-03-04 LAB — BASIC METABOLIC PANEL
Anion gap: 8 (ref 5–15)
BUN: 5 mg/dL — ABNORMAL LOW (ref 6–20)
CO2: 28 mmol/L (ref 22–32)
Calcium: 8.9 mg/dL (ref 8.9–10.3)
Chloride: 103 mmol/L (ref 98–111)
Creatinine, Ser: 0.8 mg/dL (ref 0.61–1.24)
GFR, Estimated: >60 mL/min (ref >60)
Glucose, Bld: 99 mg/dL (ref 70–99)
Potassium: 3.4 mmol/L — ABNORMAL LOW (ref 3.5–5.1)
Sodium: 139 mmol/L (ref 135–145)

## 2021-03-04 LAB — CBC
HCT: 25.6 % — ABNORMAL LOW (ref 39.0–52.0)
Hemoglobin: 8.4 g/dL — ABNORMAL LOW (ref 13.0–17.0)
MCH: 30.9 pg (ref 26.0–34.0)
MCHC: 32.8 g/dL (ref 30.0–36.0)
MCV: 94.1 fL (ref 80.0–100.0)
Platelets: 222 10*3/uL (ref 150–400)
RBC: 2.72 MIL/uL — ABNORMAL LOW (ref 4.22–5.81)
RDW: 17.7 % — ABNORMAL HIGH (ref 11.5–15.5)
WBC: 8.3 10*3/uL (ref 4.0–10.5)
nRBC: 0 % (ref 0.0–0.2)

## 2021-03-04 LAB — LACTATE DEHYDROGENASE: LDH: 129 U/L (ref 98–192)

## 2021-03-04 LAB — PROTIME-INR
INR: 0.9 (ref 0.8–1.2)
Prothrombin Time: 12.2 seconds (ref 11.4–15.2)

## 2021-03-04 LAB — HEPARIN LEVEL (UNFRACTIONATED): Heparin Unfractionated: <0.1 IU/mL — ABNORMAL LOW (ref 0.30–0.70)

## 2021-03-04 MED ORDER — POTASSIUM CHLORIDE CRYS ER 20 MEQ PO TBCR
40.0000 meq | EXTENDED_RELEASE_TABLET | Freq: Once | ORAL | Status: AC
  Administered 2021-03-04: 40 meq via ORAL
  Filled 2021-03-04: qty 2

## 2021-03-04 NOTE — Progress Notes (Signed)
Patient ID: Jon Martin, male   DOB: 1985-08-16, 36 y.o.   MRN: [id number] P  Advanced Heart Failure VAD Team Note  PCP-Cardiologist: Dr. Gala Romney   Subjective:    1/23: Admitted for recurrent DL infection - WCx + staph aureus. BCx NGTD. Abd CT tranding in the anterior abdominal wall slightly above the umbilicus adjacent to lead extending to the left ventricular assist device, suggestive of possible cellulitis. No loculated fluid collection.  1/23: Transfused 1uRBC Hgb 7.0 1/27: Driveline site debridement, wound vac placed.   Patient remains on cefazolin for MSSA.  Afebrile, no complaints.  Has been walking in halls.   LVAD INTERROGATION:  HeartMate III LVAD:   Flow 3.6 liters/min, speed 5400 power 3.8, PI 4.3,  about 20 PI events.  Objective:    Vital Signs:   Temp:  [98.2 F (36.8 C)-98.6 F (37 C)] 98.6 F (37 C) (01/29 0733) Pulse Rate:  [70-88] 88 (01/29 0733) Resp:  [14-22] 15 (01/29 0733) BP: (79-106)/(61-84) 104/74 (01/29 0733) SpO2:  [90 %-100 %] 100 % (01/29 0733) Weight:  [53.4 kg] 53.4 kg (01/29 0500) Last BM Date: 03/01/21 Mean arterial Pressure 70s-80s  Intake/Output:   Intake/Output Summary (Last 24 hours) at 03/04/2021 0932 Last data filed at 03/04/2021 0800 Gross per 24 hour  Intake 1683.79 ml  Output 700 ml  Net 983.79 ml     Physical Exam    General: Well appearing this am. NAD.  HEENT: Normal. Neck: Supple, JVP 7-8 cm. Carotids OK.  Cardiac:  Mechanical heart sounds with LVAD hum present.  Lungs:  CTAB, normal effort.  Abdomen:  NT, ND, no HSM. No bruits or masses. +BS  LVAD exit site: Wound vac at site.  Extremities:  Warm and dry. No cyanosis, clubbing, rash, or edema.  Neuro:  Alert & oriented x 3. Cranial nerves grossly intact. Moves all 4 extremities w/o difficulty. Affect pleasant    Telemetry   SR 70-80s personally reviewed  EKG   N/A   Labs   Basic Metabolic Panel: Recent Labs  Lab 02/26/21 1414 02/27/21 0552  02/28/21 0124 03/01/21 0113 03/02/21 0059 03/03/21 0101 03/04/21 0048  NA 138   < > 131* 134* 135 134* 139  K 3.4*   < > 3.6 3.5 3.7 4.0 3.4*  CL 105   < > 101 101 103 104 103  CO2 21*   < > 21* 24 24 21* 28  GLUCOSE 86   < > 101* 104* 98 187* 99  BUN 6   < > <5* <5* <5* <5* 5*  CREATININE 0.76   < > 0.76 0.78 0.66 0.82 0.80  CALCIUM 8.6*   < > 9.1 8.9 8.6* 8.8* 8.9  MG 1.9  --   --  1.6* 2.0  --   --    < > = values in this interval not displayed.    Liver Function Tests: Recent Labs  Lab 02/26/21 1414  AST 45*  ALT 25  ALKPHOS 59  BILITOT 0.4  PROT 7.4  ALBUMIN 3.8   No results for input(s): LIPASE, AMYLASE in the last 168 hours. No results for input(s): AMMONIA in the last 168 hours.  CBC: Recent Labs  Lab 02/28/21 0124 03/01/21 0113 03/02/21 0059 03/03/21 0101 03/04/21 0048  WBC 4.6 5.6 4.8 4.2 8.3  HGB 9.3* 8.9* 8.3* 8.4* 8.4*  HCT 28.4* 26.6* 25.4* 25.6* 25.6*  MCV 91.6 91.4 92.4 94.5 94.1  PLT 152 175 178 184 222    INR: Recent  Labs  Lab 02/28/21 0124 03/01/21 0113 03/02/21 0059 03/03/21 0101 03/04/21 0048  INR 1.4* 1.1 1.0 1.0 0.9    Other results: EKG:    Imaging   No results found.   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   Assessment/Plan:    1. Recurrent DL Infection  - previous MSSA DL infection 3/33, treated w/ surgical debridement and IV abx - Now w/ recurrent infection. Wound Cx  + MSSA. BCx NGTD  - CT of C/A/P w/ stranding in the anterior abdominal wall slightly above the umbilicus adjacent to lead extending to the left ventricular assist device,  suggestive of possible cellulitis. No loculated fluid collection.  - Abx narrowed cefazolin, will need 2-4 wks then chronic suppression.  - Had wound debridement 1/27 with wound vac placement, plan back to OR for vac change on Tuesday.  - Holding Coumadin, continue heparin gtt.  - ID following, appreciate their assistance    2. Chronic systolic HF due to severe NICM - Echo EF 10% with biventricular failure - HM-3 VAD implant 02/26/19 - Echo 7/21 EF 40-45% - Volume status stable to low, multiple PI events over the last day => encouraged po hydration.   - MAP stable.  - Continue sildenafil - Off coreg and losartan.    3. VAD - Admit 12/21 with multiple low flow alarms. Rising LDH and decreasing PLTs. Suspected outflow graft kink occlusion. Patient placed on his left side and resolved.  - VAD interrogated personally. Parameters stable. - Treating for DL infection per above  - Daily dressing changes  - Holding Coumadin for wound debridement, restart heparin gtt this morning (will need to go back to OR Tuesday).  - LDH stable, 129.   4. HTN - Stable.  - Off home Coreg and Losartan  5. Anemia - Admit Hgb 7.0 - Transfused 1 u RBC 1/23 - Hgb 8.4 today, stable.  - Follow H/H   6. Hypokalemia - Replace K today.   I reviewed the LVAD parameters from today, and compared the results to the patient's prior recorded data.  No programming changes were made.  The LVAD is functioning within specified parameters.  The patient performs LVAD self-test daily.  LVAD interrogation was negative for any significant power changes, alarms or PI events/speed drops.  LVAD equipment check completed and is in good working order.  Back-up equipment present.   LVAD education done on emergency procedures and precautions and reviewed exit site care.  Length of Stay: 6  Marca Ancona, MD 03/04/2021, 9:32 AM  VAD Team --- VAD ISSUES ONLY--- Pager [phone] (7am - 7am)  Advanced Heart Failure Team  Pager  [phone] (M-F; 7a - 5p)  Please contact [facility] for night-coverage after hours (5p -7a ) and weekends on [email or web address]  Patient seen and examined with the above-signed Advanced Practice Provider and/or Housestaff. I personally reviewed laboratory data, imaging studies and relevant notes. I independently examined the patient and formulated the important aspects of the plan. I have edited the note to reflect any of my changes or salient points. I have personally discussed the plan with  the patient and/or family.

## 2021-03-04 NOTE — Progress Notes (Signed)
ANTICOAGULATION CONSULT NOTE  Pharmacy Consult for heparin Indication:  LVAD  No Known Allergies  Patient Measurements: Height: 5\' 6"  (167.6 cm) Weight: 53.4 kg (117 lb 11.6 oz) (wearing LVAD controller) IBW/kg (Calculated) : 63.8 Heparin Dosing Weight: 52kg  Vital Signs: Temp: 98.6 F (37 C) (01/29 0733) Temp Source: Oral (01/29 0733) BP: 104/74 (01/29 0733) Pulse Rate: 88 (01/29 0733)  Labs: Recent Labs    03/02/21 0059 03/03/21 0101 03/04/21 0048  HGB 8.3* 8.4* 8.4*  HCT 25.4* 25.6* 25.6*  PLT 178 184 222  LABPROT 13.5 12.9 12.2  INR 1.0 1.0 0.9  HEPARINUNFRC <0.10* <0.10* <0.10*  CREATININE 0.66 0.82 0.80     Estimated Creatinine Clearance: 97.3 mL/min (by C-G formula based on SCr of 0.8 mg/dL).   Medical History: Past Medical History:  Diagnosis Date   Dilated cardiomyopathy (HCC)    Dilated cardiomyopathy (HCC) 02/2019   Polysubstance abuse (HCC)     Assessment: 27 yoM with HM3 LVAD on chronic warfarin admitted with DLI.  S/p I&D 1/27, heparin held overnight. Resumed 1/28 am.   Heparin level undetectable as expected on low fixed dose heparin at 400 units/hr. CBC and LDH stable.   Goal of Therapy:  Heparin level <0.3 units/ml Monitor platelets by anticoagulation protocol: Yes   Plan:  Heparin 400 units/h -fixed rate No warfarin for now until after repeat I&D Daily heparin level and CBC  2/28 PharmD., BCPS Clinical Pharmacist 03/04/2021 7:43 AM

## 2021-03-04 NOTE — Progress Notes (Signed)
°  CT surgery  Minimal drainage from the wound VAC.  Patient remains comfortable. Plan return to the OR for placement of ACell powder to enhance healing and changing the wound VAC sponge.  At that time he should be able to go home with a home wound VAC and weekly sponge changes in the VAD clinic.  Okay to start Coumadin on Monday, 1 day before surgery.  [name] MD

## 2021-03-04 NOTE — Anesthesia Postprocedure Evaluation (Signed)
Anesthesia Post Note  Patient: Jon Martin  Procedure(s) Performed: VAD DRIVELINE WOUND DEBRIDEMENT APPLICATION OF WOUND VAC     Patient location during evaluation: PACU Anesthesia Type: General Level of consciousness: awake and alert Pain management: pain level controlled Vital Signs Assessment: post-procedure vital signs reviewed and stable Respiratory status: spontaneous breathing, nonlabored ventilation, respiratory function stable and patient connected to nasal cannula oxygen Cardiovascular status: blood pressure returned to baseline and stable Postop Assessment: no apparent nausea or vomiting Anesthetic complications: no   No notable events documented.  Last Vitals:  Vitals:   03/04/21 0310 03/04/21 0733  BP: (!) 79/61 104/74  Pulse: 70 88  Resp: 17 15  Temp: 36.9 C 37 C  SpO2: 96% 100%    Last Pain:  Vitals:   03/04/21 0733  TempSrc: Oral  PainSc: 0-No pain                 Michaell Grider S

## 2021-03-05 ENCOUNTER — Encounter (HOSPITAL_COMMUNITY): Payer: Self-pay | Admitting: Cardiothoracic Surgery

## 2021-03-05 DIAGNOSIS — T827XXA Infection and inflammatory reaction due to other cardiac and vascular devices, implants and grafts, initial encounter: Secondary | ICD-10-CM | POA: Diagnosis not present

## 2021-03-05 LAB — COMPREHENSIVE METABOLIC PANEL
ALT: 21 U/L (ref 0–44)
AST: 43 U/L — ABNORMAL HIGH (ref 15–41)
Albumin: 2.9 g/dL — ABNORMAL LOW (ref 3.5–5.0)
Alkaline Phosphatase: 43 U/L (ref 38–126)
Anion gap: 6 (ref 5–15)
BUN: <5 mg/dL — ABNORMAL LOW (ref 6–20)
CO2: 23 mmol/L (ref 22–32)
Calcium: 8.5 mg/dL — ABNORMAL LOW (ref 8.9–10.3)
Chloride: 104 mmol/L (ref 98–111)
Creatinine, Ser: 0.69 mg/dL (ref 0.61–1.24)
GFR, Estimated: >60 mL/min (ref >60)
Glucose, Bld: 106 mg/dL — ABNORMAL HIGH (ref 70–99)
Potassium: 3.6 mmol/L (ref 3.5–5.1)
Sodium: 133 mmol/L — ABNORMAL LOW (ref 135–145)
Total Bilirubin: 0.4 mg/dL (ref 0.3–1.2)
Total Protein: 5.7 g/dL — ABNORMAL LOW (ref 6.5–8.1)

## 2021-03-05 LAB — PROTIME-INR
INR: 1 (ref 0.8–1.2)
INR: 1 (ref 0.8–1.2)
Prothrombin Time: 12.9 seconds (ref 11.4–15.2)
Prothrombin Time: 12.9 seconds (ref 11.4–15.2)

## 2021-03-05 LAB — URINALYSIS, ROUTINE W REFLEX MICROSCOPIC
Bilirubin Urine: NEGATIVE
Glucose, UA: NEGATIVE mg/dL
Hgb urine dipstick: NEGATIVE
Ketones, ur: NEGATIVE mg/dL
Leukocytes,Ua: NEGATIVE
Nitrite: NEGATIVE
Protein, ur: NEGATIVE mg/dL
Specific Gravity, Urine: <1.005 — ABNORMAL LOW (ref 1.005–1.030)
pH: 7 (ref 5.0–8.0)

## 2021-03-05 LAB — BASIC METABOLIC PANEL
Anion gap: 6 (ref 5–15)
BUN: <5 mg/dL — ABNORMAL LOW (ref 6–20)
CO2: 25 mmol/L (ref 22–32)
Calcium: 8.8 mg/dL — ABNORMAL LOW (ref 8.9–10.3)
Chloride: 107 mmol/L (ref 98–111)
Creatinine, Ser: 0.68 mg/dL (ref 0.61–1.24)
GFR, Estimated: >60 mL/min (ref >60)
Glucose, Bld: 98 mg/dL (ref 70–99)
Potassium: 3.6 mmol/L (ref 3.5–5.1)
Sodium: 138 mmol/L (ref 135–145)

## 2021-03-05 LAB — CBC
HCT: 25.7 % — ABNORMAL LOW (ref 39.0–52.0)
HCT: 24.1 % — ABNORMAL LOW (ref 39.0–52.0)
Hemoglobin: 8.2 g/dL — ABNORMAL LOW (ref 13.0–17.0)
Hemoglobin: 7.8 g/dL — ABNORMAL LOW (ref 13.0–17.0)
MCH: 30 pg (ref 26.0–34.0)
MCH: 30.5 pg (ref 26.0–34.0)
MCHC: 31.9 g/dL (ref 30.0–36.0)
MCHC: 32.4 g/dL (ref 30.0–36.0)
MCV: 94.1 fL (ref 80.0–100.0)
MCV: 94.1 fL (ref 80.0–100.0)
Platelets: 219 10*3/uL (ref 150–400)
Platelets: 215 10*3/uL (ref 150–400)
RBC: 2.73 MIL/uL — ABNORMAL LOW (ref 4.22–5.81)
RBC: 2.56 MIL/uL — ABNORMAL LOW (ref 4.22–5.81)
RDW: 17.4 % — ABNORMAL HIGH (ref 11.5–15.5)
RDW: 16.9 % — ABNORMAL HIGH (ref 11.5–15.5)
WBC: 6.5 10*3/uL (ref 4.0–10.5)
WBC: 6.6 10*3/uL (ref 4.0–10.5)
nRBC: 0 % (ref 0.0–0.2)
nRBC: 0 % (ref 0.0–0.2)

## 2021-03-05 LAB — LACTATE DEHYDROGENASE: LDH: 155 U/L (ref 98–192)

## 2021-03-05 LAB — HEPARIN LEVEL (UNFRACTIONATED): Heparin Unfractionated: <0.1 IU/mL — ABNORMAL LOW (ref 0.30–0.70)

## 2021-03-05 LAB — APTT: aPTT: 30 seconds (ref 24–36)

## 2021-03-05 MED ORDER — WARFARIN - PHARMACIST DOSING INPATIENT: Freq: Every day | Status: DC

## 2021-03-05 MED ORDER — CEFAZOLIN SODIUM-DEXTROSE 2-4 GM/100ML-% IV SOLN
2.0000 g | INTRAVENOUS | Status: DC
  Filled 2021-03-05: qty 100

## 2021-03-05 MED ORDER — POTASSIUM CHLORIDE CRYS ER 20 MEQ PO TBCR
40.0000 meq | EXTENDED_RELEASE_TABLET | Freq: Once | ORAL | Status: AC
  Administered 2021-03-05: 40 meq via ORAL
  Filled 2021-03-05: qty 2

## 2021-03-05 MED ORDER — WARFARIN SODIUM 5 MG PO TABS
5.0000 mg | ORAL_TABLET | Freq: Once | ORAL | Status: AC
  Administered 2021-03-05: 5 mg via ORAL
  Filled 2021-03-05: qty 1

## 2021-03-05 MED ORDER — ZINC SULFATE 220 (50 ZN) MG PO CAPS
220.0000 mg | ORAL_CAPSULE | Freq: Every day | ORAL | Status: DC
  Administered 2021-03-05 – 2021-03-16 (×12): 220 mg via ORAL
  Filled 2021-03-05 (×12): qty 1

## 2021-03-05 NOTE — TOC Progression Note (Addendum)
Transition of Care [facility]) - Progression Note    Patient Details  Name: Jon Martin MRN: [id number] Date of Birth: 1986-02-03  Transition of Care [facility]) CM/SW Contact  Reynold Mantell, LCSW Phone Number: 03/05/2021, 11:42 AM  Clinical Narrative:    HF CSW reached out to [facility], [phone] to follow up on the referral that was sent over last week, Friday. However, the CSW had to leave a voicemail for them to return the call. 4:01pm and 4:48pm - HF CSW [facility] again for follow up but no answer. CSW will continue to outreach [facility]. 4:52pm- HF CSW left a voicemail for Gaylord Shih work cell [phone] for her to return the call and follow up on the referral sent last week.  CSW will continue to follow throughout discharge.  Expected Discharge Plan: Home w Home Health Services Barriers to Discharge: Continued Medical Work up  Expected Discharge Plan and Services Expected Discharge Plan: Home w Home Health Services   Discharge Planning Services: CM Consult Post Acute Care Choice: Home Health Living arrangements for the past 2 months: Single Family Home                                       Social Determinants of Health (SDOH) Interventions    Readmission Risk Interventions No flowsheet data found.  Chizuko Trine, MSW, LCSW [phone] Heart Failure Social Worker

## 2021-03-05 NOTE — Progress Notes (Signed)
Mobility Specialist: Progress Note   03/05/21 1431  Mobility  Activity Ambulated independently in hallway  Level of Assistance Standby assist, set-up cues, supervision of patient - no hands on  Assistive Device  (IV Pole)  Distance Ambulated (ft) 800 ft  Activity Response Tolerated well  $Mobility charge 1 Mobility   Pre-Mobility: 90 HR Post-Mobility: 75 HR  Received pt in bed having no complaints and agreeable to mobility. Asymptomatic throughout ambulation, returned back to bed w/ call bell in reach and all needs met.  [facility] Shaletha Humble Mobility Specialist Mobility Specialist 4 [place]: [phone] Mobility Specialist 2 [place] and [place]: [phone]

## 2021-03-05 NOTE — Progress Notes (Signed)
LVAD Coordinator Rounding Note:  Admitted 02/26/21 due to Dr. Prescott Gum service for VAD drive line infection.   HM III LVAD implanted on 02/26/19 by Dr. Maren Beach under Destination Therapy criteria.  Patient laying in bed watching TV. Denies complaints. Plan for OR this tomorrow with Dr Donata Clay for wound vac change.   Wound cultures with growth of staph aureus. Will obtain cultures in OR on today. Antibiotic management per ID team: Cefazolin 2 g every 8 hours. Per ID team pt will need IV antibiotics for at least 4 weeks.   Pt may need placement at SNF in [place], [place] at discharge if discharged with wound vac and/or IV antibiotics. Luna Fuse contact information provided to inpatient CSW and case Production designer, theatre/television/film. (See note from 02/28/21 for contact info).   Discussed with patient need for 4 weeks of IV antibiotics and weekly wound vac changes. Asked pt if someone in his family would be willing to come to learn how to administer IV antibiotics 3 times per day, as patient is not thrilled about having to go to [facility] He reports no one in his family will be able to come learn IV antibiotic administration.   Vital signs: Temp: 98.6 HR: 78 Doppler Pressure: 78 Automatic BP: 92/71 (78) O2 Sat: 96% RA  Wt: 111.9>115.3>115.5>115>113.5  lbs  LVAD interrogation reveals:  Speed: 5400 Flow: 3.7 Power: 3.7w PI: 4.2 Hct: 20 - DO NOT CHANGE   Alarms: none Events: 24 PI events  Fixed speed: 5400 Low speed limit: 5100  Drive Line:  Wound vac dressing clean, dry, and intact. Minimal serosanguinous drainage in wound vac canister. No leak alarms noted. Plan for wound vac change in OR tomorrow with Dr Donata Clay.   Abdominal Abcsess: Wound vac dressing clean, dry, and intact. Minimal serosanguinous drainage in wound vac canister. No leak alarms noted. Plan for wound vac change in OR tomorrow with Dr Donata Clay.   Labs:  LDH trend: 154>145>172>163>155  INR trend: 2.3>1.4>1.1>1.0>1.0  Hgb trend:  7.0>8.4>9.3>8.9>8.3>8.2  Anticoagulation Plan: -INR Goal: 2.0 - 2.5 -ASA Dose: none  Blood Products:  - 02/26/21>>1 units PCs  Device: N/A  Infection:  02/26/21 Drive line wound culture>> few staph aureus; final  02/26/21 Abdominal abscess culture>> few staph aureus; final  02/26/21 Blood cultures >> no growth 5 days; final   Drips:  Heparin 400 units/hr  Plan/Recommendations:  1. Call VAD Coordinator if any VAD equipment or drive line issues. 2. Daily drive line dressing changes per bedside RN or VAD coordinator 3. VAD coordinator will accompany pt to OR tomorrow for drive line debridement with Dr Donata Clay.   Alyce Pagan RN VAD Coordinator  Office: [phone]  24/7 Pager: [phone]

## 2021-03-05 NOTE — Progress Notes (Signed)
[facility]  Date of Admission:  02/26/2021      Total days of antibiotics 7   Cefazolin 1/23 >> current           ASSESSMENT: Jon Martin is a 36 y.o. 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 Blood cultures are negative. Intraoperative cultures are growing something but too young to ID for now.  Went for debridement and planning on back to OR for a second look and acell application for wound treatment. Would prefer to keep him on IV antibiotics at SNF for 3-4 weeks depending on wound progress; I think the frequent wound care alone is a reason he needs the close proximity to VAD clinic.  If needed we could probably consider oral option with linezolid for a few weeks then transition to chronic cefadroxil. Prefer SNF with IV antibiotics - we discussed this with [name] in the room while [name] was there with VAD team.    PLAN: Continue cefazolin  Would plan for 4 weeks from original debridement through 2/24 Follow micro data pending from operation  Follow operative findings at upcoming procedure Can plan PICC line after any time   Principal Problem:   Infection associated with driveline of left ventricular assist device (LVAD) (HCC)    sildenafil  20 mg Oral TID    SUBJECTIVE: Upset about having to go to SNF for wound care and IV antibiotics. Thought he was going back to residence.  No complaints or concerns otherwise. Tolerating IV antibiotics well. No abdominal pain.    Review of Systems: Review of Systems  Constitutional:  Negative for chills and fever.  HENT:  Negative for tinnitus.   Eyes:  Negative for blurred vision and photophobia.  Respiratory:  Negative for cough and sputum production.   Cardiovascular:  Negative for chest pain.  Gastrointestinal:  Negative for abdominal pain, diarrhea, nausea and vomiting.  Genitourinary:  Negative for  dysuria.  Skin:  Negative for rash.  Neurological:  Negative for headaches.    No Known Allergies  OBJECTIVE: Vitals:   03/05/21 0313 03/05/21 0628 03/05/21 0717 03/05/21 0909  BP: (!) 95/47  92/71 91/70  Pulse: 85  69   Resp: 18  16   Temp: 98.5 F (36.9 C)  98.6 F (37 C)   TempSrc: Oral  Oral   SpO2: 99%  93%   Weight:  51.5 kg    Height:       Body mass index is 18.33 kg/m.  Physical Exam Vitals reviewed.  Cardiovascular:     Rate and Rhythm: Normal rate.  Pulmonary:     Effort: Pulmonary effort is normal.  Abdominal:     Comments: Wound vac in place, intact.   Skin:    General: Skin is warm and dry.     Findings: No rash.  Neurological:     Mental Status: He is alert and oriented to person, place, and time.    Lab Results Lab Results  Component Value Date   WBC 6.5 03/05/2021   HGB 8.2 (L) 03/05/2021   HCT 25.7 (L) 03/05/2021   MCV 94.1 03/05/2021   PLT 219 03/05/2021    Lab Results  Component Value Date   CREATININE 0.68 03/05/2021   BUN <5 (L) 03/05/2021   NA 138 03/05/2021   K 3.6 03/05/2021   CL 107 03/05/2021   CO2 25 03/05/2021  Lab Results  Component Value Date   ALT 25 02/26/2021   AST 45 (H) 02/26/2021   ALKPHOS 59 02/26/2021   BILITOT 0.4 02/26/2021     Microbiology: Recent Results (from the past 240 hour(s))  Aerobic Culture w Gram Stain (superficial specimen)     Status: None   Collection Time: 02/26/21 11:35 AM   Specimen: Abdomen; Wound  Result Value Ref Range Status   Specimen Description ABDOMEN  Final   Special Requests LVAD DRIVE LINE  Final   Gram Stain   Final    FEW WBC PRESENT, PREDOMINANTLY MONONUCLEAR FEW GRAM POSITIVE COCCI RARE GRAM POSITIVE RODS    Culture   Final    FEW STAPHYLOCOCCUS AUREUS SUSCEPTIBILITIES PERFORMED ON PREVIOUS CULTURE WITHIN THE LAST 5 DAYS. Performed at [facility], [address], [place], [place] [address]    Report Status 02/28/2021 FINAL  Final  Aerobic Culture w Gram  Stain (superficial specimen)     Status: None   Collection Time: 02/26/21 11:35 AM   Specimen: Abscess; Wound  Result Value Ref Range Status   Specimen Description ABSCESS  Final   Special Requests ABDOMEN  Final   Gram Stain   Final    NO WBC SEEN NO ORGANISMS SEEN Performed at [facility], [address] [place], [place] [address]    Culture FEW STAPHYLOCOCCUS AUREUS  Final   Report Status 02/28/2021 FINAL  Final   Organism ID, Bacteria STAPHYLOCOCCUS AUREUS  Final      Susceptibility   Staphylococcus aureus - MIC*    CIPROFLOXACIN 4 RESISTANT Resistant     ERYTHROMYCIN >=8 RESISTANT Resistant     GENTAMICIN <=0.5 SENSITIVE Sensitive     OXACILLIN 0.5 SENSITIVE Sensitive     TETRACYCLINE <=1 SENSITIVE Sensitive     VANCOMYCIN <=0.5 SENSITIVE Sensitive     TRIMETH/SULFA <=10 SENSITIVE Sensitive     CLINDAMYCIN RESISTANT Resistant     RIFAMPIN <=0.5 SENSITIVE Sensitive     Inducible Clindamycin POSITIVE Resistant     * FEW STAPHYLOCOCCUS AUREUS  Culture, blood (single)     Status: None   Collection Time: 02/26/21 12:00 PM   Specimen: BLOOD RIGHT HAND  Result Value Ref Range Status   Specimen Description BLOOD RIGHT HAND  Final   Special Requests   Final    BOTTLES DRAWN AEROBIC AND ANAEROBIC Blood Culture results may not be optimal due to an inadequate volume of blood received in culture bottles   Culture   Final    NO GROWTH 5 DAYS Performed at [facility], [address], [place], [place] [address]    Report Status 03/03/2021 FINAL  Final  Culture, blood (single)     Status: None   Collection Time: 02/26/21 12:20 PM   Specimen: BLOOD  Result Value Ref Range Status   Specimen Description BLOOD LEFT ANTECUBITAL  Final   Special Requests   Final    BOTTLES DRAWN AEROBIC AND ANAEROBIC Blood Culture results may not be optimal due to an inadequate volume of blood received in culture bottles   Culture   Final    NO GROWTH 5 DAYS Performed at [facility], [address], [place], [place] [address]    Report Status 03/03/2021 FINAL  Final  MRSA Next Gen by PCR, Nasal     Status: None   Collection Time: 02/26/21  2:52 PM   Specimen: Nasal Mucosa; Nasal Swab  Result Value Ref Range Status  MRSA by PCR Next Gen NOT DETECTED NOT DETECTED Final    Comment: (NOTE) The GeneXpert MRSA Assay (FDA approved for NASAL specimens only), is one component of a comprehensive MRSA colonization surveillance program. It is not intended to diagnose MRSA infection nor to guide or monitor treatment for MRSA infections. Test performance is not FDA approved in patients less than 41 years old. Performed at [facility], [address] [place], [place] [address]   Resp Panel by RT-PCR (Flu A&B, Covid) Nasal Mucosa     Status: None   Collection Time: 02/26/21  3:01 PM   Specimen: Nasal Mucosa; Nasopharyngeal(NP) swabs in vial transport medium  Result Value Ref Range Status   SARS Coronavirus 2 by RT PCR NEGATIVE NEGATIVE Final    Comment: (NOTE) SARS-CoV-2 target nucleic acids are NOT DETECTED.  The SARS-CoV-2 RNA is generally detectable in upper respiratory specimens during the acute phase of infection. The lowest concentration of SARS-CoV-2 viral copies this assay can detect is 138 copies/mL. A negative result does not preclude SARS-Cov-2 infection and should not be used as the sole basis for treatment or other patient management decisions. A negative result may occur with  improper specimen collection/handling, submission of specimen other than nasopharyngeal swab, presence of viral mutation(s) within the areas targeted by this assay, and inadequate number of viral copies(<138 copies/mL). A negative result must be combined with clinical observations, patient history, and epidemiological information. The expected result is Negative.  Fact Sheet for Patients:  BloggerCourse.com  Fact Sheet for Healthcare  Providers:  SeriousBroker.it  This test is no t yet approved or cleared by the [place] FDA and  has been authorized for detection and/or diagnosis of SARS-CoV-2 by FDA under an Emergency Use Authorization (EUA). This EUA will remain  in effect (meaning this test can be used) for the duration of the COVID-19 declaration under Section 564(b)(1) of the Act, 21 U.S.C.section 360bbb-3(b)(1), unless the authorization is terminated  or revoked sooner.       Influenza A by PCR NEGATIVE NEGATIVE Final   Influenza B by PCR NEGATIVE NEGATIVE Final    Comment: (NOTE) The Xpert Xpress SARS-CoV-2/FLU/RSV plus assay is intended as an aid in the diagnosis of influenza from Nasopharyngeal swab specimens and should not be used as a sole basis for treatment. Nasal washings and aspirates are unacceptable for Xpert Xpress SARS-CoV-2/FLU/RSV testing.  Fact Sheet for Patients: BloggerCourse.com  Fact Sheet for Healthcare Providers: SeriousBroker.it  This test is not yet approved or cleared by the [place] FDA and has been authorized for detection and/or diagnosis of SARS-CoV-2 by FDA under an Emergency Use Authorization (EUA). This EUA will remain in effect (meaning this test can be used) for the duration of the COVID-19 declaration under Section 564(b)(1) of the Act, 21 U.S.C. section 360bbb-3(b)(1), unless the authorization is terminated or revoked.  Performed at [facility], [address], [place], [place] [address]   Surgical pcr screen     Status: None   Collection Time: 03/02/21  6:10 AM   Specimen: Nasal Mucosa; Nasal Swab  Result Value Ref Range Status   MRSA, PCR NEGATIVE NEGATIVE Final   Staphylococcus aureus NEGATIVE NEGATIVE Final    Comment: (NOTE) The Xpert SA Assay (FDA approved for NASAL specimens in patients 61 years of age and older), is one component of a comprehensive surveillance  program. It is not intended to diagnose infection nor to guide or monitor treatment. Performed at [facility],  [address], [place], [place] [address]   Aerobic/Anaerobic Culture w Gram Stain (surgical/deep wound)     Status: None (Preliminary result)   Collection Time: 03/02/21  6:29 PM   Specimen: Wound  Result Value Ref Range Status   Specimen Description WOUND  Final   Special Requests LVAD DRIVELINE WOUND  Final   Gram Stain   Final    FEW WBC PRESENT, PREDOMINANTLY MONONUCLEAR NO ORGANISMS SEEN Performed at [facility], [address], [place], [place] [address]    Culture   Final    CULTURE REINCUBATED FOR BETTER GROWTH NO ANAEROBES ISOLATED; CULTURE IN PROGRESS FOR 5 DAYS    Report Status PENDING  Incomplete    Rexene Alberts, MSN, NP-C [facility] Pager: [phone]  03/05/2021  11:08 AM

## 2021-03-05 NOTE — Progress Notes (Addendum)
ANTICOAGULATION CONSULT NOTE  Pharmacy Consult for heparin Indication:  LVAD  No Known Allergies  Patient Measurements: Height: 5\' 6"  (167.6 cm) Weight: 51.5 kg (113 lb 8.6 oz) (scale B) IBW/kg (Calculated) : 63.8 Heparin Dosing Weight: 52kg  Vital Signs: Temp: 98.6 F (37 C) (01/30 0717) Temp Source: Oral (01/30 0717) BP: 92/71 (01/30 0717) Pulse Rate: 69 (01/30 0717)  Labs: Recent Labs    03/03/21 0101 03/04/21 0048 03/05/21 0039  HGB 8.4* 8.4* 8.2*  HCT 25.6* 25.6* 25.7*  PLT 184 222 219  LABPROT 12.9 12.2 12.9  INR 1.0 0.9 1.0  HEPARINUNFRC <0.10* <0.10* <0.10*  CREATININE 0.82 0.80 0.68     Estimated Creatinine Clearance: 93.9 mL/min (by C-G formula based on SCr of 0.68 mg/dL).   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  S/p I&D 1/27, heparin held overnight. Resumed 1/28 am.   Heparin level undetectable as expected on low fixed dose heparin at 400 units/hr. CBC and LDH stable. To OR tomorrow.   Addendum: surgery contacted HF team, relaying that they were ok with starting warfarin tonight.   INR is 1.0, plan for vac change tomorrow. Prior to admit dose was 2.5 mg (5 mg x 0.5) every Sun, Tue, Thu; 5 mg (5 mg x 1) all other days  Goal of Therapy:  Heparin level <0.3 units/ml Monitor platelets by anticoagulation protocol: Yes   Plan:  Will give warfarin 5mg  tonight Continue daily INR checks Heparin 400 units/h -fixed rate No warfarin for now until after repeat I&D 1/31 Daily heparin level and CBC  PharmD., BCPS Clinical Pharmacist 03/05/2021 8:04 AM

## 2021-03-05 NOTE — Progress Notes (Addendum)
Patient ID: Jon Martin, male   DOB: [birth year], 36 y.o.   MRN: [id number]  Advanced Heart Failure VAD Team Note  PCP-Cardiologist: Dr. Haroldine Laws   Subjective:    1/23: Admitted for recurrent DL infection - WCx + staph aureus. BCx NGTD. Abd CT tranding in the anterior abdominal wall slightly above the umbilicus adjacent to lead extending to the left ventricular assist device, suggestive of possible cellulitis. No loculated fluid collection.  1/23: Transfused 1uRBC Hgb 7.0 1/27: Driveline site debridement, wound vac placed.   Patient remains on cefazolin for MSSA.    Denies SOB. Denies pain.   LVAD INTERROGATION:  HeartMate III LVAD:   Flow 3.8 liters/min, speed 5400 power 4, PI 3.4 . PI events 33  Objective:    Vital Signs:   Temp:  [98.5 F (36.9 C)-98.6 F (37 C)] 98.6 F (37 C) (01/30 0717) Pulse Rate:  [69-88] 69 (01/30 0717) Resp:  [13-20] 16 (01/30 0717) BP: (78-95)/(47-71) 92/71 (01/30 0717) SpO2:  [93 %-100 %] 93 % (01/30 0717) Weight:  [51.5 kg] 51.5 kg (01/30 0628) Last BM Date: 03/01/21 Mean arterial Pressure 70s  Intake/Output:   Intake/Output Summary (Last 24 hours) at 03/05/2021 0749 Last data filed at 03/05/2021 0538 Gross per 24 hour  Intake 1835.84 ml  Output 2225 ml  Net -389.16 ml     Physical Exam    Physical Exam: GENERAL: No acute distress. HEENT: normal  NECK: Supple, JVP flat .  2+ bilaterally, no bruits.  No lymphadenopathy or thyromegaly appreciated.   CARDIAC:  Mechanical heart sounds with LVAD hum present.  LUNGS:  Clear to auscultation bilaterally.  ABDOMEN:  Soft, round, nontender, positive bowel sounds x4.     LVAD exit site:  VAC dressing in place. No erythema or drainage.  Stabilization device present and accurately applied.   EXTREMITIES:  Warm and dry, no cyanosis, clubbing, rash or edema  NEUROLOGIC:  Alert and oriented x 3.    No aphasia.  No dysarthria.  Affect pleasant.     Telemetry   SR 80s personally checked.   EKG    N/A   Labs   Basic Metabolic Panel: Recent Labs  Lab 02/26/21 1414 02/27/21 0552 03/01/21 0113 03/02/21 0059 03/03/21 0101 03/04/21 0048 03/05/21 0039  NA 138   < > 134* 135 134* 139 138  K 3.4*   < > 3.5 3.7 4.0 3.4* 3.6  CL 105   < > 101 103 104 103 107  CO2 21*   < > 24 24 21* 28 25  GLUCOSE 86   < > 104* 98 187* 99 98  BUN 6   < > <5* <5* <5* 5* <5*  CREATININE 0.76   < > 0.78 0.66 0.82 0.80 0.68  CALCIUM 8.6*   < > 8.9 8.6* 8.8* 8.9 8.8*  MG 1.9  --  1.6* 2.0  --   --   --    < > = values in this interval not displayed.    Liver Function Tests: Recent Labs  Lab 02/26/21 1414  AST 45*  ALT 25  ALKPHOS 59  BILITOT 0.4  PROT 7.4  ALBUMIN 3.8   No results for input(s): LIPASE, AMYLASE in the last 168 hours. No results for input(s): AMMONIA in the last 168 hours.  CBC: Recent Labs  Lab 03/01/21 0113 03/02/21 0059 03/03/21 0101 03/04/21 0048 03/05/21 0039  WBC 5.6 4.8 4.2 8.3 6.5  HGB 8.9* 8.3* 8.4* 8.4* 8.2*  HCT 26.6* 25.4*  25.6* 25.6* 25.7*  MCV 91.4 92.4 94.5 94.1 94.1  PLT 175 178 184 222 219    INR: Recent Labs  Lab 03/01/21 0113 03/02/21 0059 03/03/21 0101 03/04/21 0048 03/05/21 0039  INR 1.1 1.0 1.0 0.9 1.0    Other results: EKG:    Imaging   No results found.   Medications:     Scheduled Medications:  sildenafil  20 mg Oral TID    Infusions:   ceFAZolin (ANCEF) IV 2 g (03/05/21 0538)   heparin 400 Units/hr (03/05/21 0400)    PRN Medications: acetaminophen, morphine injection, ondansetron (ZOFRAN) IV   Patient Profile   36 y/o w/ chronic systolic heart failure, NICM, s/p MH3 LVAD w/ prior history of MSSA DL infection, being admitted for recurrent DL infection.   Assessment/Plan:    1. Recurrent DL Infection  - previous MSSA DL infection 3/22, treated w/ surgical debridement and IV abx - Now w/ recurrent infection. Wound Cx  + MSSA. BCx NGTD  - CT of C/A/P w/ stranding in the anterior abdominal wall slightly  above the umbilicus adjacent to lead extending to the left ventricular assist device, suggestive of possible cellulitis. No loculated fluid collection.  - Abx narrowed cefazolin, will need 2-4 wks then chronic suppression.  - Timing of PICC line.  - Had wound debridement 1/27 with wound vac placement, plan back to OR for vac change on Tuesday.  - Holding Coumadin, continue heparin gtt.  - ID following, appreciate their assistance    2. Chronic systolic HF due to severe NICM - Echo EF 10% with biventricular failure - HM-3 VAD implant 02/26/19 - Echo 7/21 EF 40-45% -Volume status stable. Map stable. - Continue sildenafil - Off coreg and losartan.    3. VAD - Admit 12/21 with multiple low flow alarms. Rising LDH and decreasing PLTs. Suspected outflow graft kink occlusion. Patient placed on his left side and resolved.  - VAD interrogated personally. Parameters stable. - Treating for DL infection per above  - Daily dressing changes  - Holding Coumadin for wound debridement, continue heparin gtt until he completes operative debridements. Back to OR Tuesday).  - LDH stable, 155  4. HTN - Stable.  - Off home Coreg and Losartan  5. Anemia - Admit Hgb 7.0 - Transfused 1 u RBC 1/23 - Hgb 8.2 today, stable.  - Follow H/H   6. Hypokalemia - K 3.6.  - Replace K  I reviewed the LVAD parameters from today, and compared the results to the patient's prior recorded data.  No programming changes were made.  The LVAD is functioning within specified parameters.  The patient performs LVAD self-test daily.  LVAD interrogation was negative for any significant power changes, alarms or PI events/speed drops.  LVAD equipment check completed and is in good working order.  Back-up equipment present.   LVAD education done on emergency procedures and precautions and reviewed exit site care.  Length of Stay: [place], NP 03/05/2021, 7:49 AM  VAD Team --- VAD ISSUES ONLY--- Pager [phone] (7am -  7am)  Advanced Heart Failure Team  Pager [phone] (M-F; 7a - 5p)  Please contact [facility] for night-coverage after hours (5p -7a ) and weekends on [email or web address]  Patient seen and examined with the above-signed Advanced Practice Provider and/or Housestaff. I personally reviewed laboratory data, imaging studies and relevant notes. I independently examined the patient and formulated the important aspects of the plan. I have edited the note to reflect any of my changes  or salient points. I have personally discussed the plan with the patient and/or family.  Remains on cefazolin for DL infection. Afebrile. Now s/p I&D with vac placement. Plan to return to OR tomorrow to repeat. Remains on heparin. No bleeding. Hgb and volume status stable.   General:  NAD.  HEENT: normal  Neck: supple. JVP not elevated.  Carotids 2+ bilat; no bruits. No lymphadenopathy or thryomegaly appreciated. Cor: LVAD hum.  Lungs: Clear. Abdomen: obese soft, nontender, non-distended. + wound vac. Anchor in place.  Extremities: no cyanosis, clubbing, rash. Warm no edema  Neuro: alert & oriented x 3. No focal deficits. Moves all 4 without problem   Continue cefazolin. For repeat debridement and wound vac change tomorrow. Continue heparin. (D/w PharmD). VAD interrogated personally. Parameters stable.   Glori Bickers, MD  9:18 AM

## 2021-03-05 NOTE — Progress Notes (Signed)
3 Days Post-Op Procedure(s) (LRB): VAD DRIVELINE WOUND DEBRIDEMENT (N/A) APPLICATION OF WOUND VAC (N/A) Subjective: Doing well with wound VAC Objective: Vital signs in last 24 hours: Temp:  [98.5 F (36.9 C)-98.6 F (37 C)] 98.6 F (37 C) (01/30 1119) Pulse Rate:  [60-88] 60 (01/30 1119) Cardiac Rhythm: Sinus tachycardia (01/30 0700) Resp:  [13-18] 15 (01/30 1119) BP: (78-99)/(47-80) 99/80 (01/30 1119) SpO2:  [93 %-100 %] 100 % (01/30 1119) Weight:  [51.5 kg] 51.5 kg (01/30 0628)  Hemodynamic parameters for last 24 hours:   Stable  Intake/Output from previous day: 01/29 0701 - 01/30 0700 In: 1835.8 [P.O.:1440; I.V.:95.8; IV Piggyback:300] Out: 2675 [Urine:2650; Drains:25] Intake/Output this shift: Total I/O In: -  Out: 575 [Urine:575]  Minimal VAC drainage, sponge compressed Normal VAD hum  Lab Results: Recent Labs    03/04/21 0048 03/05/21 0039  WBC 8.3 6.5  HGB 8.4* 8.2*  HCT 25.6* 25.7*  PLT 222 219   BMET:  Recent Labs    03/04/21 0048 03/05/21 0039  NA 139 138  K 3.4* 3.6  CL 103 107  CO2 28 25  GLUCOSE 99 98  BUN 5* <5*  CREATININE 0.80 0.68  CALCIUM 8.9 8.8*    PT/INR:  Recent Labs    03/05/21 0039  LABPROT 12.9  INR 1.0   ABG    Component Value Date/Time   PHART 7.424 03/01/2021 2302   HCO3 22.9 03/01/2021 2302   TCO2 27 03/02/2019 0358   ACIDBASEDEF 1.0 03/01/2021 2302   O2SAT 98.0 03/01/2021 2302   CBG (last 3)  No results for input(s): GLUCAP in the last 72 hours.  Assessment/Plan: S/P Procedure(s) (LRB): VAD DRIVELINE WOUND DEBRIDEMENT (N/A) APPLICATION OF WOUND VAC (N/A) VAC change in OR tomorrow - if clean will add acell product   LOS: 7 days    Lovett Sox 03/05/2021

## 2021-03-06 ENCOUNTER — Other Ambulatory Visit: Payer: Self-pay

## 2021-03-06 ENCOUNTER — Inpatient Hospital Stay (HOSPITAL_COMMUNITY): Payer: Medicaid Other | Admitting: Anesthesiology

## 2021-03-06 ENCOUNTER — Encounter (HOSPITAL_COMMUNITY)
Admission: AD | Disposition: A | Discharge status: Home / Self Care | Payer: Self-pay | Source: Ambulatory Visit | Attending: Internal Medicine

## 2021-03-06 ENCOUNTER — Encounter (HOSPITAL_COMMUNITY): Payer: Self-pay | Admitting: Internal Medicine

## 2021-03-06 DIAGNOSIS — Z0000 Encounter for general adult medical examination without abnormal findings: Secondary | ICD-10-CM

## 2021-03-06 DIAGNOSIS — T827XXA Infection and inflammatory reaction due to other cardiac and vascular devices, implants and grafts, initial encounter: Secondary | ICD-10-CM

## 2021-03-06 HISTORY — PX: STERNAL WOUND DEBRIDEMENT: SHX1058

## 2021-03-06 HISTORY — PX: APPLICATION OF WOUND VAC: SHX5189

## 2021-03-06 HISTORY — PX: APPLICATION OF A-CELL OF CHEST/ABDOMEN: SHX6302

## 2021-03-06 LAB — LACTATE DEHYDROGENASE: LDH: 129 U/L (ref 98–192)

## 2021-03-06 LAB — BASIC METABOLIC PANEL
Anion gap: 7 (ref 5–15)
BUN: <5 mg/dL — ABNORMAL LOW (ref 6–20)
CO2: 23 mmol/L (ref 22–32)
Calcium: 8.9 mg/dL (ref 8.9–10.3)
Chloride: 106 mmol/L (ref 98–111)
Creatinine, Ser: 0.77 mg/dL (ref 0.61–1.24)
GFR, Estimated: >60 mL/min (ref >60)
Glucose, Bld: 105 mg/dL — ABNORMAL HIGH (ref 70–99)
Potassium: 3.9 mmol/L (ref 3.5–5.1)
Sodium: 136 mmol/L (ref 135–145)

## 2021-03-06 LAB — CBC
HCT: 24.5 % — ABNORMAL LOW (ref 39.0–52.0)
Hemoglobin: 7.9 g/dL — ABNORMAL LOW (ref 13.0–17.0)
MCH: 30.3 pg (ref 26.0–34.0)
MCHC: 32.2 g/dL (ref 30.0–36.0)
MCV: 93.9 fL (ref 80.0–100.0)
Platelets: 208 10*3/uL (ref 150–400)
RBC: 2.61 MIL/uL — ABNORMAL LOW (ref 4.22–5.81)
RDW: 17.1 % — ABNORMAL HIGH (ref 11.5–15.5)
WBC: 6.2 10*3/uL (ref 4.0–10.5)
nRBC: 0 % (ref 0.0–0.2)

## 2021-03-06 LAB — HEPARIN LEVEL (UNFRACTIONATED): Heparin Unfractionated: <0.1 IU/mL — ABNORMAL LOW (ref 0.30–0.70)

## 2021-03-06 LAB — PROTIME-INR
INR: 1 (ref 0.8–1.2)
Prothrombin Time: 13.4 seconds (ref 11.4–15.2)

## 2021-03-06 SURGERY — DEBRIDEMENT, WOUND, STERNUM
Anesthesia: General

## 2021-03-06 MED ORDER — ONDANSETRON HCL 4 MG/2ML IJ SOLN
INTRAMUSCULAR | Status: DC | PRN
  Administered 2021-03-06: 4 mg via INTRAVENOUS

## 2021-03-06 MED ORDER — LACTATED RINGERS IV SOLN: INTRAVENOUS | Status: DC

## 2021-03-06 MED ORDER — DEXAMETHASONE SODIUM PHOSPHATE 10 MG/ML IJ SOLN
INTRAMUSCULAR | Status: DC | PRN
Start: 2021-03-06 — End: 2021-03-06
  Administered 2021-03-06: 5 mg via INTRAVENOUS

## 2021-03-06 MED ORDER — CHLORHEXIDINE GLUCONATE 0.12 % MT SOLN: 15.0000 mL | Freq: Once | OROMUCOSAL | Status: AC

## 2021-03-06 MED ORDER — CEFAZOLIN SODIUM-DEXTROSE 2-4 GM/100ML-% IV SOLN
2.0000 g | INTRAVENOUS | Status: AC
  Administered 2021-03-06: 2 g via INTRAVENOUS
  Filled 2021-03-06: qty 100

## 2021-03-06 MED ORDER — CHLORHEXIDINE GLUCONATE 0.12 % MT SOLN
OROMUCOSAL | Status: AC
  Administered 2021-03-06: 15 mL via OROMUCOSAL
  Filled 2021-03-06: qty 15

## 2021-03-06 MED ORDER — PHENYLEPHRINE HCL-NACL 20-0.9 MG/250ML-% IV SOLN
INTRAVENOUS | Status: DC | PRN
  Administered 2021-03-06: 25 ug/min via INTRAVENOUS

## 2021-03-06 MED ORDER — 0.9 % SODIUM CHLORIDE (POUR BTL) OPTIME
TOPICAL | Status: DC | PRN
Start: 2021-03-06 — End: 2021-03-06
  Administered 2021-03-06: 1000 mL

## 2021-03-06 MED ORDER — MIDAZOLAM HCL 2 MG/2ML IJ SOLN
INTRAMUSCULAR | Status: DC | PRN
  Administered 2021-03-06 (×2): 1 mg via INTRAVENOUS

## 2021-03-06 MED ORDER — FENTANYL CITRATE (PF) 250 MCG/5ML IJ SOLN
INTRAMUSCULAR | Status: DC | PRN
  Administered 2021-03-06 (×3): 50 ug via INTRAVENOUS

## 2021-03-06 MED ORDER — LIDOCAINE 2% (20 MG/ML) 5 ML SYRINGE
INTRAMUSCULAR | Status: DC | PRN
  Administered 2021-03-06: 60 mg via INTRAVENOUS

## 2021-03-06 MED ORDER — PROPOFOL 10 MG/ML IV BOLUS
INTRAVENOUS | Status: DC | PRN
  Administered 2021-03-06: 50 mg via INTRAVENOUS
  Administered 2021-03-06: 100 mg via INTRAVENOUS

## 2021-03-06 MED ORDER — MIDAZOLAM HCL 2 MG/2ML IJ SOLN
INTRAMUSCULAR | Status: AC
  Filled 2021-03-06: qty 2

## 2021-03-06 MED ORDER — FENTANYL CITRATE (PF) 250 MCG/5ML IJ SOLN
INTRAMUSCULAR | Status: AC
  Filled 2021-03-06: qty 5

## 2021-03-06 MED ORDER — ORAL CARE MOUTH RINSE: 15.0000 mL | Freq: Once | OROMUCOSAL | Status: AC

## 2021-03-06 MED ORDER — FENTANYL CITRATE (PF) 100 MCG/2ML IJ SOLN: 25.0000 ug | INTRAMUSCULAR | Status: DC | PRN

## 2021-03-06 MED ORDER — PROPOFOL 10 MG/ML IV BOLUS
INTRAVENOUS | Status: AC
  Filled 2021-03-06: qty 20

## 2021-03-06 MED ORDER — HEPARIN (PORCINE) 25000 UT/250ML-% IV SOLN
400.0000 [IU]/h | INTRAVENOUS | Status: DC
  Administered 2021-03-07: 400 [IU]/h via INTRAVENOUS
  Filled 2021-03-06 (×2): qty 250

## 2021-03-06 MED ORDER — ETOMIDATE 2 MG/ML IV SOLN
INTRAVENOUS | Status: AC
  Filled 2021-03-06: qty 10

## 2021-03-06 MED ORDER — WARFARIN SODIUM 5 MG PO TABS
5.0000 mg | ORAL_TABLET | Freq: Once | ORAL | Status: AC
  Administered 2021-03-06: 5 mg via ORAL
  Filled 2021-03-06: qty 1

## 2021-03-06 MED ORDER — AMISULPRIDE (ANTIEMETIC) 5 MG/2ML IV SOLN: 10.0000 mg | Freq: Once | INTRAVENOUS | Status: DC | PRN

## 2021-03-06 MED ORDER — PROMETHAZINE HCL 25 MG/ML IJ SOLN: 6.2500 mg | INTRAMUSCULAR | Status: DC | PRN

## 2021-03-06 MED ORDER — PHENYLEPHRINE 40 MCG/ML (10ML) SYRINGE FOR IV PUSH (FOR BLOOD PRESSURE SUPPORT)
PREFILLED_SYRINGE | INTRAVENOUS | Status: DC | PRN
  Administered 2021-03-06 (×2): 80 ug via INTRAVENOUS
  Administered 2021-03-06: 120 ug via INTRAVENOUS
  Administered 2021-03-06 (×3): 80 ug via INTRAVENOUS

## 2021-03-06 MED ORDER — VANCOMYCIN HCL 1000 MG IV SOLR
INTRAVENOUS | Status: AC
  Filled 2021-03-06: qty 20

## 2021-03-06 MED ORDER — VANCOMYCIN HCL 1000 MG IV SOLR
INTRAVENOUS | Status: DC | PRN
  Administered 2021-03-06: 1000 mL

## 2021-03-06 SURGICAL SUPPLY — 80 items
ANCHOR CATH FOLEY SECURE (MISCELLANEOUS) ×1 IMPLANT
ATTRACTOMAT 16X20 MAGNETIC DRP (DRAPES) ×2 IMPLANT
BAG DECANTER FOR FLEXI CONT (MISCELLANEOUS) ×2 IMPLANT
BENZOIN TINCTURE PRP APPL 2/3 (GAUZE/BANDAGES/DRESSINGS) ×1 IMPLANT
BLADE CLIPPER SURG (BLADE) ×3 IMPLANT
BLADE SURG 10 STRL SS (BLADE) ×3 IMPLANT
BLADE SURG 15 STRL LF DISP TIS (BLADE) IMPLANT
BLADE SURG 15 STRL SS (BLADE)
BNDG GAUZE ELAST 4 BULKY (GAUZE/BANDAGES/DRESSINGS) IMPLANT
CANISTER SUCT 3000ML PPV (MISCELLANEOUS) ×3 IMPLANT
CANISTER WOUND CARE 500ML ATS (WOUND CARE) ×3 IMPLANT
CATH FOLEY 2WAY SLVR  5CC 16FR (CATHETERS)
CATH FOLEY 2WAY SLVR 5CC 16FR (CATHETERS) IMPLANT
CATH THORACIC 28FR RT ANG (CATHETERS) IMPLANT
CATH THORACIC 36FR (CATHETERS) IMPLANT
CLIP TI MEDIUM 6 (CLIP) ×1 IMPLANT
CLIP TI WIDE RED SMALL 6 (CLIP) ×1 IMPLANT
CLIP VESOCCLUDE SM WIDE 24/CT (CLIP) IMPLANT
CNTNR URN SCR LID CUP LEK RST (MISCELLANEOUS) IMPLANT
CONN Y 3/8X3/8X3/8  BEN (MISCELLANEOUS)
CONN Y 3/8X3/8X3/8 BEN (MISCELLANEOUS) IMPLANT
CONT SPEC 4OZ STRL OR WHT (MISCELLANEOUS)
CONTAINER PROTECT SURGISLUSH (MISCELLANEOUS) ×4 IMPLANT
COVER SURGICAL LIGHT HANDLE (MISCELLANEOUS) ×6 IMPLANT
DRAPE CV SPLIT W-CLR ANES SCRN (DRAPES) ×1 IMPLANT
DRAPE LAPAROSCOPIC ABDOMINAL (DRAPES) ×2 IMPLANT
DRAPE PERI GROIN 82X75IN TIB (DRAPES) ×1 IMPLANT
DRAPE SLUSH/WARMER DISC (DRAPES) IMPLANT
DRAPE WARM FLUID 44X44 (DRAPES) IMPLANT
DRSG AQUACEL AG ADV 3.5X14 (GAUZE/BANDAGES/DRESSINGS) ×3 IMPLANT
DRSG CUTIMED SORBACT 7X9 (GAUZE/BANDAGES/DRESSINGS) ×1 IMPLANT
DRSG PAD ABDOMINAL 8X10 ST (GAUZE/BANDAGES/DRESSINGS) IMPLANT
DRSG VAC ATS LRG SENSATRAC (GAUZE/BANDAGES/DRESSINGS) ×3 IMPLANT
DRSG VAC ATS MED SENSATRAC (GAUZE/BANDAGES/DRESSINGS) ×3 IMPLANT
DRSG VAC ATS SM SENSATRAC (GAUZE/BANDAGES/DRESSINGS) ×3 IMPLANT
ELECT BLADE 4.0 EZ CLEAN MEGAD (MISCELLANEOUS) ×3
ELECT REM PT RETURN 9FT ADLT (ELECTROSURGICAL) ×3
ELECTRODE BLDE 4.0 EZ CLN MEGD (MISCELLANEOUS) IMPLANT
ELECTRODE REM PT RTRN 9FT ADLT (ELECTROSURGICAL) ×2 IMPLANT
GAUZE 4X4 16PLY ~~LOC~~+RFID DBL (SPONGE) ×3 IMPLANT
GAUZE SPONGE 4X4 12PLY STRL (GAUZE/BANDAGES/DRESSINGS) ×3 IMPLANT
GAUZE XEROFORM 5X9 LF (GAUZE/BANDAGES/DRESSINGS) IMPLANT
GLOVE SURG ENC MOIS LTX SZ7.5 (GLOVE) ×6 IMPLANT
GLOVE SURG UNDER POLY LF SZ6.5 (GLOVE) ×3 IMPLANT
GOWN STRL REUS W/ TWL LRG LVL3 (GOWN DISPOSABLE) ×8 IMPLANT
GOWN STRL REUS W/TWL LRG LVL3 (GOWN DISPOSABLE) ×4
HANDPIECE INTERPULSE COAX TIP (DISPOSABLE) ×1
HEMOSTAT POWDER SURGIFOAM 1G (HEMOSTASIS) IMPLANT
HEMOSTAT SURGICEL 2X14 (HEMOSTASIS) IMPLANT
KIT BASIN OR (CUSTOM PROCEDURE TRAY) ×3 IMPLANT
KIT SUCTION CATH 14FR (SUCTIONS) IMPLANT
KIT TURNOVER KIT B (KITS) ×3 IMPLANT
MICROMATRIX 500MG (Tissue) ×3 IMPLANT
NS IRRIG 1000ML POUR BTL (IV SOLUTION) ×3 IMPLANT
PACK CHEST (CUSTOM PROCEDURE TRAY) ×3 IMPLANT
PACK GENERAL/GYN (CUSTOM PROCEDURE TRAY) ×3 IMPLANT
PAD ARMBOARD 7.5X6 YLW CONV (MISCELLANEOUS) ×6 IMPLANT
PENCIL BUTTON HOLSTER BLD 10FT (ELECTRODE) ×1 IMPLANT
SET HNDPC FAN SPRY TIP SCT (DISPOSABLE) ×2 IMPLANT
SOL PREP POV-IOD 4OZ 10% (MISCELLANEOUS) IMPLANT
SOLUTION PARTIC MCRMTRX 500MG (Tissue) IMPLANT
SPONGE T-LAP 18X18 ~~LOC~~+RFID (SPONGE) ×12 IMPLANT
SPONGE T-LAP 4X18 ~~LOC~~+RFID (SPONGE) ×2 IMPLANT
STAPLER VISISTAT 35W (STAPLE) IMPLANT
SUT ETHILON 3 0 FSL (SUTURE) IMPLANT
SUT STEEL 6MS V (SUTURE) IMPLANT
SUT STEEL STERNAL CCS#1 18IN (SUTURE) IMPLANT
SUT STEEL SZ 6 DBL 3X14 BALL (SUTURE) IMPLANT
SUT VIC AB 1 CTX 36 (SUTURE)
SUT VIC AB 1 CTX36XBRD ANBCTR (SUTURE) ×4 IMPLANT
SUT VIC AB 2-0 CTX 27 (SUTURE) ×4 IMPLANT
SUT VIC AB 3-0 SH 8-18 (SUTURE) ×1 IMPLANT
SUT VIC AB 3-0 X1 27 (SUTURE) ×4 IMPLANT
SWAB COLLECTION DEVICE MRSA (MISCELLANEOUS) IMPLANT
SWAB CULTURE ESWAB REG 1ML (MISCELLANEOUS) IMPLANT
SYR 5ML LL (SYRINGE) IMPLANT
TOWEL GREEN STERILE (TOWEL DISPOSABLE) ×3 IMPLANT
TOWEL GREEN STERILE FF (TOWEL DISPOSABLE) ×3 IMPLANT
TRAY FOLEY MTR SLVR 16FR STAT (SET/KITS/TRAYS/PACK) IMPLANT
WATER STERILE IRR 1000ML POUR (IV SOLUTION) ×3 IMPLANT

## 2021-03-06 NOTE — Progress Notes (Signed)
LVAD Coordinator Rounding Note:  Admitted 02/26/21 due to Dr. Clayborne Dana service for VAD drive line infection.   HM III LVAD implanted on 02/26/19 by Dr. Darcey Nora under Destination Therapy criteria.  Patient laying in bed watching TV. Denies complaints. Plan for OR this this afternoon with Dr Prescott Gum for wound vac change.   Wound cultures with growth of staph aureus. Antibiotic management per ID team: Cefazolin 2 g every 8 hours. Per ID team pt will need IV antibiotics for at least 4 weeks.   Pt may need placement at SNF in [place], [place] at discharge if discharged with wound vac and/or IV antibiotics. [facility] contact information provided to inpatient CSW and case Freight forwarder. (See note from 02/28/21 for contact info).   Discussed with patient need for 4 weeks of IV antibiotics and weekly wound vac changes. Asked pt if someone in his family would be willing to come to learn how to administer IV antibiotics 3 times per day, as patient is not thrilled about possibly having to go to SNF. He reports no one in his family will be able to come learn IV antibiotic administration.   Vital signs: Temp: 98.4 HR: 77 Doppler Pressure: 72 Automatic BP: 84/69 (76) O2 Sat: 96% RA  Wt: 111.9>115.3>115.5>115>113.5>110.2  lbs  LVAD interrogation reveals:  Speed: 5400 Flow: 3.5 Power: 3.8w PI: 5.8 Hct: 20 - DO NOT CHANGE   Alarms: none Events: 20 PI events  Fixed speed: 5400 Low speed limit: 5100  Drive Line:  Wound vac dressing clean, dry, and intact. Minimal serosanguinous drainage in wound vac canister. No leak alarms noted. Plan for wound vac change in OR today with Dr Prescott Gum.   Abdominal Abcsess: Wound vac dressing clean, dry, and intact. Minimal serosanguinous drainage in wound vac canister. No leak alarms noted. Plan for wound vac change in OR today with Dr Prescott Gum.   Labs:  LDH trend: 154>145>172>163>155>132  INR trend: 2.3>1.4>1.1>1.0>1.0>1.0  Hgb trend:  7.0>8.4>9.3>8.9>8.3>8.2>7.9  Anticoagulation Plan: -INR Goal: 2.0 - 2.5 -ASA Dose: none  Blood Products:  - 02/26/21>>1 units PCs  Device: N/A  Infection:  02/26/21 Drive line wound culture>> few staph aureus; final  02/26/21 Abdominal abscess culture>> few staph aureus; final  02/26/21 Blood cultures >> no growth 5 days; final   Drips:  Heparin 400 units/hr  Plan/Recommendations:  1. Call VAD Coordinator if any VAD equipment or drive line issues. 2. Daily drive line dressing changes per bedside RN or VAD coordinator 3. VAD coordinator will accompany pt to OR today for drive line debridement with Dr Prescott Gum.   Emerson Monte RN [place] Coordinator  Office: [phone]  24/7 Pager: [phone]

## 2021-03-06 NOTE — Discharge Summary (Addendum)
Advanced Heart Failure Team  Discharge Summary   Patient ID: Jon Martin MRN: [id number], DOB/AGE: [id number] y.o. Admit date: 02/26/2021 D/C date:     03/16/2021   Primary Discharge Diagnoses:  Driveline Infection  Chronic Systolic Heart Failure, S/p HM3 LVAD Nonischemic Cardiomyopathy  Hypertension  Anemia s/p transfusion    Hospital Course:   [name] is a 36 y/o male from 46. [place]  w/ h/o severe systolic HF due to NICM EF 15% and prior polysubstance abuse (cocaine and ETOH)   Underwent HM-3 LVAD placement on 02/26/19 by Dr. Nils Martin. Extubated 1/25. Required milrinone post op for RV support. Milrinone eventually weaned and Co-ox remained stable of inotropes at 61%. Later placed on sildenafil for continued RV support. Post op recovery complicated by hypoxic respiratory failure, moderate sized rt pneumothorax requiring CT placement.   Admitted 6/28-08/06/19 for AKI and hypotension in setting of volume depletion due to GI bug.Had multiple flow alarms. Echo EF 40-45%    Admitted 12/21 for anemia in setting of supratherapeutic INR (8.0) and bleeding from a sore on his lip. Transfused but did not bleed any further. Developed refractory low flow alarms on VAD with drop in platelets and rising LDH. RUQ U/S was normal (doubt splenic sequestration). Peripheral smear showed no evidence of hemolysis.Patient placed on his left side and low flow alarms resolved. It was suspected possible obstruction/clot of outflow graft causing Plts and Hgb to drop. CT 12/10 showed no evidence of outflow graft kink/clot.  LDH trended back down to his baseline, below 200s. Plts started to trend back up.   Admitted 3/25-4/21/22 for MSSA DL infection requiring surgical debridement. D/c'd to [facility] for IV abx.     Presented to LVAD clinic on 02/26/21 for routine f/u w/ recurrent DL infection. Purulent drainage from exit site + abscess. Denied subjective fever/ chills. VAD parameters stable. Volume stable. No  dyspnea. Hgb was low at 7.0. Direct admitted and transfused x 1u RBC w/ appropriate rise in hgb. Blood cultures and wound cultures collected and placed on broad spectrum abx.  CT of C/A/P w/ stranding in the anterior abdominal wall slightly above the umbilicus adjacent to lead extending to the left ventricular assist device, suggestive of possible cellulitis. No loculated fluid collection. Wound Cx  + MSSA. BCx NGTD. ID consulted and narrowed abx to cefazolin. Taken to OR 1/27 for wound debridement and wound vac placement and again for 1/31 for washout with ACell applications. Back to OR on 2/6 for wound vac change. After procedures, coumadin was restarted w/ heparin gtt for bridge. VAD parameters remained stable. ID recommended oral linezolid BID for 3 weeks at discharge then switch to chronic cefadroxil afterwards.  On 2/10, he was seen an examined by Dr. Haroldine Martin and pt felt stable for discharge home.   VAD coordinator filled pillbox for pt prior to discharge. He will return back to VAD clinic on 2/13 for follow-up and INR check.    LVAD INTERROGATION:  HeartMate III LVAD:   Flow 4.5 liters/min, speed 5400 power 3.9, PI 3.3  6 PI events.   Discharge Weight Range: 118 lb  Discharge Vitals: Blood pressure 110/76, pulse 95, temperature 97.9 F (36.6 C), temperature source Oral, resp. rate 18, height 5\' 6"  (1.676 m), weight 53.8 kg, SpO2 97 %.  Labs: Lab Results  Component Value Date   WBC 8.8 03/16/2021   HGB 9.4 (L) 03/16/2021   HCT 29.8 (L) 03/16/2021   MCV 94.0 03/16/2021   PLT 251 03/16/2021  Recent Labs  Lab 03/16/21 0050  NA 136  K 3.7  CL 105  CO2 21*  BUN 8  CREATININE 0.76  CALCIUM 9.1  GLUCOSE 130*   Lab Results  Component Value Date   CHOL 89 02/11/2019   HDL 25 (L) 02/11/2019   LDLCALC 52 02/11/2019   TRIG 58 02/11/2019   BNP (last 3 results) No results for input(s): BNP in the last 8760 hours.  ProBNP (last 3 results) No results for input(s): PROBNP in the  last 8760 hours.   Diagnostic Studies/Procedures   No results found.  Discharge Medications   Allergies as of 03/16/2021   No Known Allergies      Medication List     STOP taking these medications    carvedilol 3.125 MG tablet Commonly known as: COREG   losartan 25 MG tablet Commonly known as: COZAAR   magnesium oxide 400 (241.3 Mg) MG tablet Commonly known as: MAG-OX   pantoprazole 40 MG tablet Commonly known as: PROTONIX       TAKE these medications    linezolid 600 MG tablet Commonly known as: Zyvox Take 1 tablet (600 mg total) by mouth 2 (two) times daily.   potassium chloride SA 20 MEQ tablet Commonly known as: Klor-Con M20 Take 2 tablets (40 meq) by mouth daily, or as directed by heart failure clinic What changed: additional instructions   sildenafil 20 MG tablet Commonly known as: REVATIO Take 1 tablet (20 mg total) by mouth 3 (three) times daily.   warfarin 5 MG tablet Commonly known as: Coumadin Take as directed. If you are unsure how to take this medication, talk to your nurse or doctor. Original instructions: Take 1 tablet (5 mg total) by mouth daily. What changed:  how much to take how to take this when to take this additional instructions   Zinc Sulfate 220 (50 Zn) MG Tabs Take 1 tablet (220 mg total) by mouth daily. Start taking on: March 17, 2021        Disposition   The patient will be discharged in stable condition to home.   Follow-up Information     [facility] Follow up.   Specialty: Cardiology Why: 03/19/21 at 11:00 AM  VAD Clinic Contact information: [id number] [place] [phone]                  Duration of Discharge Encounter: Greater than 35 minutes   Signed, Nelida Gores  03/16/2021, 2:05 PM  Patient seen and examined with the above-signed Advanced Practice Provider and/or Housestaff. I  personally reviewed laboratory data, imaging studies and relevant notes. I independently examined the patient and formulated the important aspects of the plan. I have edited the note to reflect any of my changes or salient points. I have personally discussed the plan with the patient and/or family.  Feels good. Eager to go home. Remains afebrile on IV abx. Wound vac working well. No SOB, orthopnea or PND. INR 1.7 on heparin/warfarin  General:  NAD.  HEENT: normal  Neck: supple. JVP not elevated.  Carotids 2+ bilat; no bruits. No lymphadenopathy or thryomegaly appreciated. Cor: LVAD hum.  Lungs: Clear. Abdomen: soft, nontender, non-distended. No hepatosplenomegaly. No bruits or masses. Good bowel sounds. Driveline site with wound vac Anchor in place.  Extremities: no cyanosis, clubbing, rash. Warm no edema  Neuro: alert & oriented x 3. No focal deficits. Moves all 4 without problem  I think he is stable for d/c today. Switch IV ancef to Linezolid. Will return on Monday for wound vac change in clinic. Discussed warfarin dosing with PharmD personally.  VAD interrogated personally. Parameters stable.  Total time for d/c > 35 mins  Glori Bickers, MD  8:49 PM

## 2021-03-06 NOTE — Anesthesia Postprocedure Evaluation (Signed)
Anesthesia Post Note  Patient: Jon Martin  Procedure(s) Performed: VAD DRIVELINE WOUND DEBRIDEMENT APPLICATION OF WOUND VAC APPLICATION OF A-CELL OF CHEST/ABDOMEN     Patient location during evaluation: PACU Anesthesia Type: General Level of consciousness: sedated Pain management: pain level controlled Vital Signs Assessment: post-procedure vital signs reviewed and stable Respiratory status: spontaneous breathing and respiratory function stable Cardiovascular status: stable Postop Assessment: no apparent nausea or vomiting Anesthetic complications: no   No notable events documented.  Last Vitals:  Vitals:   03/06/21 1605 03/06/21 1620  BP: 105/89 (!) 87/68  Pulse: 78   Resp: 13 15  Temp: 36.4 C   SpO2: 100% 100%    Last Pain:  Vitals:   03/06/21 1605  TempSrc:   PainSc: 0-No pain                 Jon Martin DANIEL

## 2021-03-06 NOTE — Progress Notes (Addendum)
Patient ID: Jon Martin, male   DOB: November 06, 1985, 36 y.o.   MRN: [id number] P  Advanced Heart Failure VAD Team Note  PCP-Cardiologist: Dr. Gala Romney   Subjective:    1/23: Admitted for recurrent DL infection - WCx + staph aureus. BCx NGTD. Abd CT tranding in the anterior abdominal wall slightly above the umbilicus adjacent to lead extending to the left ventricular assist device, suggestive of possible cellulitis. No loculated fluid collection.  1/23: Transfused 1uRBC Hgb 7.0 1/27: Driveline site debridement, wound vac placed. Cultures 1/27 +rare staph aureus   Patient remains on cefazolin for MSSA.  AF. WBC 6.2. Plan for return to OR today for repeat I&D.  Feels well today. No complaints. Denies abdominal pain. No fever/chills. No dyspnea or CP.      LVAD INTERROGATION:  HeartMate III LVAD:   Flow 3.3 liters/min, speed 5400 power 3.8, PI 6.2 .  5 PI events  Objective:    Vital Signs:   Temp:  [98.3 F (36.8 C)-98.6 F (37 C)] 98.4 F (36.9 C) (01/31 0310) Pulse Rate:  [60-97] 95 (01/31 0310) Resp:  [14-19] 19 (01/31 0310) BP: (81-99)/(51-80) 90/69 (01/31 0310) SpO2:  [93 %-100 %] 98 % (01/31 0310) Weight:  [50 kg] 50 kg (01/31 0500) Last BM Date: 03/01/21 Mean arterial Pressure 70s  Intake/Output:   Intake/Output Summary (Last 24 hours) at 03/06/2021 0710 Last data filed at 03/06/2021 6606 Gross per 24 hour  Intake 632.01 ml  Output 2275 ml  Net -1642.99 ml     Physical Exam    Physical Exam: GENERAL: Well appearing young male. NAD HEENT: normal  NECK: Supple, JVD not elevated  2+ bilaterally, no bruits.  No lymphadenopathy or thyromegaly appreciated.   CARDIAC:  Mechanical heart sounds with LVAD hum present.  LUNGS:  Clear to auscultation bilaterally. No wheezing  ABDOMEN:  Soft, round, nontender, positive bowel sounds x4.     LVAD exit site:  VAC dressing in place. Tight seal. No erythema or drainage.  Stabilization device present and accurately applied.    EXTREMITIES:  Warm and dry, no cyanosis, clubbing, rash or edema  NEUROLOGIC:  Alert and oriented x 3.  No aphasia.  No dysarthria.  Affect pleasant.     Telemetry   NSR 60s personally checked.   EKG   N/A   Labs   Basic Metabolic Panel: Recent Labs  Lab 03/01/21 0113 03/02/21 0059 03/03/21 0101 03/04/21 0048 03/05/21 0039 03/05/21 2133 03/06/21 0051  NA 134* 135 134* 139 138 133* 136  K 3.5 3.7 4.0 3.4* 3.6 3.6 3.9  CL 101 103 104 103 107 104 106  CO2 24 24 21* 28 25 23 23   GLUCOSE 104* 98 187* 99 98 106* 105*  BUN <5* <5* <5* 5* <5* <5* <5*  CREATININE 0.78 0.66 0.82 0.80 0.68 0.69 0.77  CALCIUM 8.9 8.6* 8.8* 8.9 8.8* 8.5* 8.9  MG 1.6* 2.0  --   --   --   --   --     Liver Function Tests: Recent Labs  Lab 03/05/21 2133  AST 43*  ALT 21  ALKPHOS 43  BILITOT 0.4  PROT 5.7*  ALBUMIN 2.9*   No results for input(s): LIPASE, AMYLASE in the last 168 hours. No results for input(s): AMMONIA in the last 168 hours.  CBC: Recent Labs  Lab 03/03/21 0101 03/04/21 0048 03/05/21 0039 03/05/21 2133 03/06/21 0051  WBC 4.2 8.3 6.5 6.6 6.2  HGB 8.4* 8.4* 8.2* 7.8* 7.9*  HCT 25.6*  25.6* 25.7* 24.1* 24.5*  MCV 94.5 94.1 94.1 94.1 93.9  PLT 184 222 219 215 208    INR: Recent Labs  Lab 03/03/21 0101 03/04/21 0048 03/05/21 0039 03/05/21 2133 03/06/21 0051  INR 1.0 0.9 1.0 1.0 1.0    Other results:    Imaging   No results found.   Medications:     Scheduled Medications:  sildenafil  20 mg Oral TID   Warfarin - Pharmacist Dosing Inpatient   Does not apply q1600   zinc sulfate  220 mg Oral Daily    Infusions:   ceFAZolin (ANCEF) IV 2 g (03/06/21 0507)    ceFAZolin (ANCEF) IV     heparin 400 Units/hr (03/06/21 0300)    PRN Medications: acetaminophen, morphine injection, ondansetron (ZOFRAN) IV   Patient Profile   36 y/o w/ chronic systolic heart failure, NICM, s/p MH3 LVAD w/ prior history of MSSA DL infection, being admitted for  recurrent DL infection.   Assessment/Plan:    1. Recurrent DL Infection  - previous MSSA DL infection 5/85, treated w/ surgical debridement and IV abx - Now w/ recurrent infection. Wound Cx  + MSSA. BCx NGTD  - CT of C/A/P w/ stranding in the anterior abdominal wall slightly above the umbilicus adjacent to lead extending to the left ventricular assist device, suggestive of possible cellulitis. No loculated fluid collection.  - Abx narrowed cefazolin, will need 4 wks then chronic suppression.  - Timing of PICC line per ID  - Had wound debridement 1/27 with wound vac placement, plan back to OR for vac change today.  - Holding Coumadin, continue heparin gtt.  - ID following, appreciate their assistance    2. Chronic systolic HF due to severe NICM - Echo EF 10% with biventricular failure - HM-3 VAD implant 02/26/19 - Echo 7/21 EF 40-45% - Volume status stable. Map stable. - Continue sildenafil - Off coreg and losartan.    3. VAD - Admit 12/21 with multiple low flow alarms. Rising LDH and decreasing PLTs. Suspected outflow graft kink occlusion. Patient placed on his left side and resolved.  - VAD interrogated personally. Parameters stable. - Treating for DL infection per above  - Daily dressing changes  - Holding Coumadin for wound debridement, continue heparin gtt until he completes operative debridements. Back to OR today).  - LDH stable, 129  4. HTN - Stable.  - Off home Coreg and Losartan  5. Anemia - Admit Hgb 7.0 - Transfused 1 u RBC 1/23 - Hgb 7.9 today, stable.  - Follow H/H   6. Hypokalemia - K 3.9.  - Replace K  I reviewed the LVAD parameters from today, and compared the results to the patient's prior recorded data.  No programming changes were made.  The LVAD is functioning within specified parameters.  The patient performs LVAD self-test daily.  LVAD interrogation was negative for any significant power changes, alarms or PI events/speed drops.  LVAD equipment check  completed and is in good working order.  Back-up equipment present.   LVAD education done on emergency procedures and precautions and reviewed exit site care.  Length of Stay: [address] [place] 03/06/2021, 7:10 AM  VAD Team --- VAD ISSUES ONLY--- Pager [phone] (7am - 7am)  Advanced Heart Failure Team  Pager [phone] (M-F; 7a - 5p)  Please contact [facility] for night-coverage after hours (5p -7a ) and weekends on [email or web address]   Patient seen and examined with the above-signed Advanced Practice Provider and/or Housestaff. I  personally reviewed laboratory data, imaging studies and relevant notes. I independently examined the patient and formulated the important aspects of the plan. I have edited the note to reflect any of my changes or salient points. I have personally discussed the plan with the patient and/or family.  Feels ok. Denies fevers or chills. Remains on IV abx. Wound vac functioning well. Remains on heparin. Warfarin started back last night Hgb stable at 7.9. Back to OR today for further debridement and wound vac change on ab wound.   General:  NAD.  HEENT: normal  Neck: supple. JVP not elevated.  Carotids 2+ bilat; no bruits. No lymphadenopathy or thryomegaly appreciated. Cor: LVAD hum.  Lungs: Clear. Abdomen: soft, nontender, non-distended. No hepatosplenomegaly. No bruits or masses. Good bowel sounds. Wound vac site ok . Anchor in place.  Extremities: no cyanosis, clubbing, rash. Warm no edema  Neuro: alert & oriented x 3. No focal deficits. Moves all 4 without problem   Back to OR today for repeat debridement and wound vac change. Continue IV abx. Continue heparin and warfarin until INR 1.8. Watch hgb. Volume status ok.  VAD interrogated personally. Parameters stable.  Arvilla Meres, MD  1:51 PM

## 2021-03-06 NOTE — TOC Progression Note (Addendum)
Transition of Care [facility]) - Progression Note    Patient Details  Name: Trayson Stitely MRN: [id number] Date of Birth: [birth year]  Transition of Care [facility]) CM/SW Contact  Farida Mcreynolds, LCSW Phone Number: 03/06/2021, 9:25 AM  Clinical Narrative:    9:24am - HF CSW reached out to Gaylord Shih [phone] the admission's coordinator at [facility] however she didn't answer the phone and CSW had to leave a voicemail for her to return the call. HF CSW faxed out clinical[facility] in [facility] for backup to [place]. 11:45am - HF CSW again [facility] both the admissions office [phone] and cell [phone] and the main office [phone] and was told that the admission's coordinator is out and they took a message to call the CSW back.  CSW will continue to follow throughout discharge.   Expected Discharge Plan: Home w Home Health Services Barriers to Discharge: Continued Medical Work up  Expected Discharge Plan and Services Expected Discharge Plan: Home w Home Health Services   Discharge Planning Services: CM Consult Post Acute Care Choice: Home Health Living arrangements for the past 2 months: Single Family Home                                       Social Determinants of Health (SDOH) Interventions    Readmission Risk Interventions No flowsheet data found.  Ginamarie Banfield, MSW, LCSW [phone] Heart Failure Social Worker

## 2021-03-06 NOTE — Anesthesia Preprocedure Evaluation (Addendum)
Anesthesia Evaluation  Patient identified by MRN, date of birth, ID band Patient awake    Reviewed: Allergy & Precautions, NPO status , Patient's Chart, lab work & pertinent test results  Airway Mallampati: II  TM Distance: >3 FB Neck ROM: Full    Dental  (+) Dental Advisory Given, Poor Dentition,    Pulmonary former smoker,    breath sounds clear to auscultation       Cardiovascular +CHF  Normal cardiovascular exam+ Valvular Problems/Murmurs MR   LVAD in place  Echo 02/27/2021 1. Limited study for LVAD Assessment.   Parameters not entered, study assumes most recent documented LVAD parameters:   Flow 3.5 liters/min, speed 5400, power 3.8, PI 4.6   Inflow cannula visualized without Doppler interrogation.   Outflow cannular visualized without Doppler interrogation. Left ventricular ejection fraction, by estimation, is 40 to 45%. The left ventricle has mildly decreased function. The left ventricle demonstrates  global hypokinesis. Neutral septum.  2. Right ventricular systolic function is mildly reduced with basal function largely preserved. The right ventricular size is severely enlarged.  3. The mitral valve is normal in structure. Mild to moderate mitral valve regurgitation. No evidence of mitral stenosis.  4. The aortic valve is tricuspid. Aortic valve regurgitation is not visualized. Aortic valve opens consistently.    Neuro/Psych negative neurological ROS  negative psych ROS   GI/Hepatic negative GI ROS, Neg liver ROS,   Endo/Other  negative endocrine ROS  Renal/GU Renal disease     Musculoskeletal negative musculoskeletal ROS (+)   Abdominal   Peds  Hematology negative hematology ROS (+) anemia ,   Anesthesia Other Findings   Reproductive/Obstetrics                            Anesthesia Physical  Anesthesia Plan  ASA: 4  Anesthesia Plan: General   Post-op Pain  Management: Minimal or no pain anticipated   Induction: Intravenous  PONV Risk Score and Plan: 3 and Ondansetron, Midazolam, Treatment may vary due to age or medical condition and Dexamethasone  Airway Management Planned: LMA  Additional Equipment:   Intra-op Plan:   Post-operative Plan: Extubation in OR  Informed Consent: I have reviewed the patients History and Physical, chart, labs and discussed the procedure including the risks, benefits and alternatives for the proposed anesthesia with the patient or authorized representative who has indicated his/her understanding and acceptance.     Dental advisory given  Plan Discussed with: Anesthesiologist, CRNA and Surgeon  Anesthesia Plan Comments:        Anesthesia Quick Evaluation

## 2021-03-06 NOTE — Progress Notes (Signed)
VAD Coordinator Procedure Note:   VAD Coordinator met patient in OR. Pt undergoing wound vac change per Dr. Prescott Gum. Hemodynamics and VAD parameters monitored by myself and anesthesia throughout the procedure. Blood pressures were obtained with automatic cuff on right arm.    Time: Doppler Auto  BP Flow PI Power Speed  Pre-procedure:  1410  95/72 (79)       1500  107/82 (91) 4.2 3.1 3.8 5400           Sedation Induction: 1505  90/79 (85) 4.2 3.2 3.7 5400   1515  79/69 (74) 4.2 3.0 3.8 5400   1530  81/61 (69) 4.2 2.9 3.9 5400   1545  77/68 (72) 4.1 3.1 3.8 5400                    Recovery Area: 1610  105/89 (96) 3.5 5.9 3.8 5400   1620  87/68 (76) 3.4 6.1 3.9 5400             Patient Disposition: 500 mg of Acell placed in wound bed. Sorbac sutured in wound bed. Black sponge on top. Good seal achieved. Will plan to change wound vac next Monday either in hospital or VAD clinic. Will continue Zinc at hospital discharge. Patient tolerated the procedure well. VAD Coordinator accompanied and remained with patient in recovery area.   Emerson Monte RN [place] Coordinator  Office: [phone]  24/7 Pager: [phone]

## 2021-03-06 NOTE — Progress Notes (Signed)
Pre Procedure note for inpatients:   Jon Martin has been scheduled for Procedure(s): VAD DRIVELINE WOUND DEBRIDEMENT (N/A) APPLICATION OF WOUND VAC (N/A) APPLICATION OF A-CELL OF CHEST/ABDOMEN today. The various methods of treatment have been discussed with the patient. After consideration of the risks, benefits and treatment options the patient has consented to the planned procedure.   The patient has been seen and labs reviewed. There are no changes in the patients condition to prevent proceeding with the planned procedure today.  Recent labs:  Lab Results  Component Value Date   WBC 6.2 03/06/2021   HGB 7.9 (L) 03/06/2021   HCT 24.5 (L) 03/06/2021   PLT 208 03/06/2021   GLUCOSE 105 (H) 03/06/2021   CHOL 89 02/11/2019   TRIG 58 02/11/2019   HDL 25 (L) 02/11/2019   LDLCALC 52 02/11/2019   ALT 21 03/05/2021   AST 43 (H) 03/05/2021   NA 136 03/06/2021   K 3.9 03/06/2021   CL 106 03/06/2021   CREATININE 0.77 03/06/2021   BUN <5 (L) 03/06/2021   CO2 23 03/06/2021   TSH 1.862 01/18/2020   INR 1.0 03/06/2021   HGBA1C 5.2 02/11/2019    Lovett Sox, MD 03/06/2021 2:24 PM

## 2021-03-06 NOTE — Progress Notes (Signed)
ANTICOAGULATION CONSULT NOTE  Pharmacy Consult for heparin>warfarin Indication:  LVAD  No Known Allergies  Patient Measurements: Height: 5\' 6"  (167.6 cm) Weight: 50 kg (110 lb 3.7 oz) IBW/kg (Calculated) : 63.8 Heparin Dosing Weight: 52kg  Vital Signs: Temp: 98.4 F (36.9 C) (01/31 0310) Temp Source: Oral (01/31 0310) BP: 90/69 (01/31 0310) Pulse Rate: 95 (01/31 0310)  Labs: Recent Labs    03/04/21 0048 03/05/21 0039 03/05/21 2133 03/06/21 0051  HGB 8.4* 8.2* 7.8* 7.9*  HCT 25.6* 25.7* 24.1* 24.5*  PLT 222 219 215 208  APTT  --   --  30  --   LABPROT 12.2 12.9 12.9 13.4  INR 0.9 1.0 1.0 1.0  HEPARINUNFRC <0.10* <0.10*  --  <0.10*  CREATININE 0.80 0.68 0.69 0.77     Estimated Creatinine Clearance: 91.1 mL/min (by C-G formula based on SCr of 0.77 mg/dL).   Medical History: Past Medical History:  Diagnosis Date   Dilated cardiomyopathy (HCC)    Dilated cardiomyopathy (HCC) 02/2019   Polysubstance abuse (HCC)     Assessment: 62 yoM with HM3 LVAD on chronic warfarin admitted with DLI.  S/p I&D 1/27, heparin held overnight. Resumed 1/28 am.   Heparin level undetectable as expected on low fixed dose heparin at 400 units/hr. CBC and LDH stable. Wound vac change today. LVAD coordinator indicated surgery ok with restarting heparin later tonight (8p) and ok with warfarin tonight.    INR is 1.0.  Prior to admit dose was 2.5 mg (5 mg x 0.5) every Sun, Tue, Thu; 5 mg (5 mg x 1) all other days  Goal of Therapy:  Heparin level <0.3 units/ml Monitor platelets by anticoagulation protocol: Yes   Plan:  Will give warfarin 5mg  tonight Continue daily INR checks Heparin 400 units/h -fixed rate - restart at 8p  04-30-1990 PharmD., BCPS Clinical Pharmacist 03/06/2021 7:23 AM

## 2021-03-06 NOTE — Plan of Care (Signed)
°  Problem: Education: Goal: Patient will understand all VAD equipment and how it functions Outcome: Progressing   Problem: Education: Goal: Knowledge of General Education information will improve Description: Including pain rating scale, medication(s)/side effects and non-pharmacologic comfort measures Outcome: Progressing   Problem: Clinical Measurements: Goal: Ability to maintain clinical measurements within normal limits will improve Outcome: Progressing   Problem: Activity: Goal: Risk for activity intolerance will decrease Outcome: Progressing   Problem: Nutrition: Goal: Adequate nutrition will be maintained Outcome: Progressing   Problem: Coping: Goal: Level of anxiety will decrease Outcome: Progressing   Problem: Safety: Goal: Ability to remain free from injury will improve Outcome: Progressing

## 2021-03-06 NOTE — Progress Notes (Signed)
Mobility Specialist: Progress Note   03/06/21 1157  Mobility  Activity Ambulated independently in hallway  Level of Assistance Independent  Assistive Device  (IV Pole)  Distance Ambulated (ft) 800 ft  Activity Response Tolerated well  $Mobility charge 1 Mobility   Pre-Mobility: 73 HR Post-Mobility: 68 HR  Received pt in bed having no complaints and agreeable to mobility. Asymptomatic throughout ambulation, returned back to bed w/ call bell in reach and all needs met.  [facility] Nicolaos Mitrano Mobility Specialist Mobility Specialist 4 [place]: [phone] Mobility Specialist 2 [place]: [phone]

## 2021-03-06 NOTE — Transfer of Care (Signed)
Immediate Anesthesia Transfer of Care Note  Patient: Jon Martin  Procedure(s) Performed: VAD DRIVELINE WOUND DEBRIDEMENT APPLICATION OF WOUND VAC APPLICATION OF A-CELL OF CHEST/ABDOMEN  Patient Location: PACU  Anesthesia Type:General  Level of Consciousness: awake, alert  and oriented  Airway & Oxygen Therapy: Patient Spontanous Breathing and Patient connected to face mask oxygen  Post-op Assessment: Report given to RN and Post -op Vital signs reviewed and stable  Post vital signs: Reviewed and stable  Last Vitals:  Vitals Value Taken Time  BP    Temp    Pulse    Resp    SpO2      Last Pain:  Vitals:   03/06/21 1341  TempSrc:   PainSc: 0-No pain      Patients Stated Pain Goal: 0 (03/06/21 1341)  Complications: No notable events documented.

## 2021-03-06 NOTE — Anesthesia Procedure Notes (Signed)
Procedure Name: LMA Insertion Date/Time: 03/06/2021 3:03 PM Performed by: Mayer Camel, CRNA Pre-anesthesia Checklist: Patient identified, Emergency Drugs available, Suction available and Patient being monitored Patient Re-evaluated:Patient Re-evaluated prior to induction Oxygen Delivery Method: Circle System Utilized Preoxygenation: Pre-oxygenation with 100% oxygen Induction Type: IV induction Ventilation: Mask ventilation without difficulty LMA: LMA inserted LMA Size: 4.0 Number of attempts: 1 Airway Equipment and Method: Bite block Placement Confirmation: positive ETCO2 Tube secured with: Tape Dental Injury: Teeth and Oropharynx as per pre-operative assessment

## 2021-03-06 NOTE — Brief Op Note (Addendum)
03/06/2021  4:08 PM  PATIENT:  Jon Martin  36 y.o. male  PRE-OPERATIVE DIAGNOSIS:  VAD DRIVELINE INFECTION  POST-OPERATIVE DIAGNOSIS:  VAD DRIVELINE INFECTION  PROCEDURE:  Procedure(s): VAD DRIVELINE WOUND DEBRIDEMENT (N/A) APPLICATION OF WOUND VAC (N/A) APPLICATION OF A-CELL OF CHEST/ABDOMEN  Findings: clean wound bed 8cmx4 cm x 2cm depth.  Some granulation starting, 500 mg ACell powder applied  SURGEON:  Surgeon(s) and Role:    [name], MD - Primary  PHYSICIAN ASSISTANT:   ASSISTANTS: na   ANESTHESIA:   general  EBL:  5 mL   BLOOD ADMINISTERED:none  DRAINS: none   LOCAL MEDICATIONS USED:  none  SPECIMEN:  none  DISPOSITION OF SPECIMEN:  N/A  COUNTS:  YES  TOURNIQUET:  * No tourniquets in log *  DICTATION: .Dragon Dictation  PLAN OF CARE:  return to 2 C  PATIENT DISPOSITION:  PACU - hemodynamically stable.   Delay start of Pharmacological VTE agent (>24hrs) due to surgical blood loss or risk of bleeding: start iv heparin 400 u/hr at 2000

## 2021-03-07 ENCOUNTER — Other Ambulatory Visit (HOSPITAL_COMMUNITY): Payer: Self-pay

## 2021-03-07 ENCOUNTER — Encounter (HOSPITAL_COMMUNITY): Payer: Self-pay | Admitting: Cardiothoracic Surgery

## 2021-03-07 DIAGNOSIS — T827XXA Infection and inflammatory reaction due to other cardiac and vascular devices, implants and grafts, initial encounter: Secondary | ICD-10-CM | POA: Diagnosis not present

## 2021-03-07 LAB — AEROBIC/ANAEROBIC CULTURE W GRAM STAIN (SURGICAL/DEEP WOUND)

## 2021-03-07 LAB — CBC
HCT: 25.1 % — ABNORMAL LOW (ref 39.0–52.0)
Hemoglobin: 8.1 g/dL — ABNORMAL LOW (ref 13.0–17.0)
MCH: 30.2 pg (ref 26.0–34.0)
MCHC: 32.3 g/dL (ref 30.0–36.0)
MCV: 93.7 fL (ref 80.0–100.0)
Platelets: 225 K/uL (ref 150–400)
RBC: 2.68 MIL/uL — ABNORMAL LOW (ref 4.22–5.81)
RDW: 16.9 % — ABNORMAL HIGH (ref 11.5–15.5)
WBC: 5.3 K/uL (ref 4.0–10.5)
nRBC: 0 % (ref 0.0–0.2)

## 2021-03-07 LAB — HEPARIN LEVEL (UNFRACTIONATED): Heparin Unfractionated: <0.1 IU/mL — ABNORMAL LOW (ref 0.30–0.70)

## 2021-03-07 LAB — PROTIME-INR
INR: 1 (ref 0.8–1.2)
Prothrombin Time: 13.5 s (ref 11.4–15.2)

## 2021-03-07 LAB — LACTATE DEHYDROGENASE: LDH: 209 U/L — ABNORMAL HIGH (ref 98–192)

## 2021-03-07 MED ORDER — WARFARIN SODIUM 5 MG PO TABS
5.0000 mg | ORAL_TABLET | Freq: Once | ORAL | Status: AC
  Administered 2021-03-07: 5 mg via ORAL
  Filled 2021-03-07: qty 1

## 2021-03-07 NOTE — Progress Notes (Signed)
Brief ID Note:   Jon Martin is a 36 y.o. male with MSSA infection involving destination HM3 LVAD.   Op note reviewed from yesterday's procedure - overall clean appearing after original debridement 1/27. Some non-viable tissue removed. Acell application.   Culture data with MSSA matching superficial culture of drainage. Sensitive to tetracycline and bactrim also.   Have been discussing disposition with primary team - he would like to go home and may be refusing SNF placement; he is not a good candidate for home PICC line and worry it would pose as more of a risk for him.   He has been getting continual IV therapy while here inpatient and will continue to do so while he awaits INRs to increase back to safe discharge levels. We will look into PO linezolid 600 mg BID for 3 week course to complete treatment then plan for long term suppression with cefadroxil thereafter. No drug interactions noted. Platelets are normal range currently and baseline. Linezolid is nearly fully absorbed and a good bioavailable option for him, especially with excellent surgical control.  Will look into cost analysis as well.    Janene Madeira, MSN, NP-C [facility] for Infectious Disease [email or web address] Pager: [phone] Office: [phone] [place]: [phone]

## 2021-03-07 NOTE — Op Note (Signed)
NAMEDEANTRE, Martin MEDICAL RECORD NO: [id number] ACCOUNT NO: [id number] DATE OF BIRTH: 01-04-1986 FACILITY: [facility] LOCATION: [facility] PHYSICIAN: Kerin Perna III, MD  Operative Report   DATE OF PROCEDURE: 03/06/2021  OPERATION:   1.  Excisional debridement of HeartMate 3 power cord exit site wound. 2.  Irrigation of HeartMate 3 power cord exit site wound with vancomycin pulse lavage irrigation. 3.  Application of ACell powder to the wound. 4.  Wound VAC change of the wound.  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  SURGEON:  Kerin Perna III, MD  ANESTHESIA:  General.  DESCRIPTION OF PROCEDURE:  The patient was examined in preoperative holding and informed consent was documented.  Final issues were discussed with the patient in a face-to-face visit.  The patient was then brought to the operating room, placed supine on  the operating table and general anesthesia was induced.  The VAD coordinator  was present with the monitoring equipment and power supply the entire operation.  The previously placed wound VAC was removed and the sponge was removed.  The wound in abdomen and chest  were prepped and draped as a sterile field.  A proper time-out was performed.  The wound was examined.  It was very clean since the original debridement.  There was some eschar and poor quality tissue, which we sharply excised from the borders of the wound.  The wound measured 8 cm long x 4 cm wide x 2 cm deep.  The wound was then  irrigated with half liter of vancomycin irrigation.  I then placed 500 mg of ACell powder in the wound covered with a Sorbact mesh. On top of the mesh, I placed a new  small wound VAC sponge, covered it with a sterile sheets, connected to the vacuum  suction device and there was good compression of the sponge.  The patient was then reversed from anesthesia and returned to the recovery room in stable condition.   VAI D: 03/06/2021 7:04:50 pm T: 03/07/2021 12:24:00 am  JOB: [id number]/ [id number]

## 2021-03-07 NOTE — Plan of Care (Signed)
  Problem: Education: Goal: Patient will understand all VAD equipment and how it functions Outcome: Progressing Goal: Patient will be able to verbalize current INR target range and antiplatelet therapy for discharge home Outcome: Progressing   Problem: Cardiac: Goal: LVAD will function as expected and patient will experience no clinical alarms Outcome: Progressing   Problem: Education: Goal: Knowledge of General Education information will improve Description: Including pain rating scale, medication(s)/side effects and non-pharmacologic comfort measures Outcome: Progressing   Problem: Health Behavior/Discharge Planning: Goal: Ability to manage health-related needs will improve Outcome: Progressing   Problem: Clinical Measurements: Goal: Ability to maintain clinical measurements within normal limits will improve Outcome: Progressing Goal: Will remain free from infection Outcome: Progressing Goal: Diagnostic test results will improve Outcome: Progressing Goal: Respiratory complications will improve Outcome: Progressing Goal: Cardiovascular complication will be avoided Outcome: Progressing   Problem: Activity: Goal: Risk for activity intolerance will decrease Outcome: Progressing   Problem: Nutrition: Goal: Adequate nutrition will be maintained Outcome: Progressing   Problem: Coping: Goal: Level of anxiety will decrease Outcome: Progressing   Problem: Elimination: Goal: Will not experience complications related to bowel motility Outcome: Progressing Goal: Will not experience complications related to urinary retention Outcome: Progressing   Problem: Pain Managment: Goal: General experience of comfort will improve Outcome: Progressing   Problem: Safety: Goal: Ability to remain free from injury will improve Outcome: Progressing   Problem: Skin Integrity: Goal: Risk for impaired skin integrity will decrease Outcome: Progressing   

## 2021-03-07 NOTE — TOC Benefit Eligibility Note (Signed)
Patient Product/process development scientist completed.    The patient is currently admitted and upon discharge could be taking linezolid (Zyvox) 600 mg tablets.  Requires Prior Authorization  The patient is insured through [employer] Medicare Part D     Roland Earl, CPhT Pharmacy Patient Advocate Specialist [facility] Patient Advocate Team Direct Number: [phone]  Fax: [phone]

## 2021-03-07 NOTE — Progress Notes (Signed)
CT surgery  Doing well after wound VAC change debridement and application of ACell powder to HeartMate 3 power cord tunnel wound.  Minimal bleeding associated with Coumadin reloading and IV heparin bridge at low-dose  Continue IV antibiotics, wound VAC therapy and good nutrition. [name] MD

## 2021-03-07 NOTE — Plan of Care (Signed)
°  Problem: Education: °Goal: Patient will understand all VAD equipment and how it functions °Outcome: Progressing °Goal: Patient will be able to verbalize current INR target range and antiplatelet therapy for discharge home °Outcome: Progressing °  °Problem: Cardiac: °Goal: LVAD will function as expected and patient will experience no clinical alarms °Outcome: Progressing °  °Problem: Education: °Goal: Knowledge of General Education information will improve °Description: Including pain rating scale, medication(s)/side effects and non-pharmacologic comfort measures °Outcome: Progressing °  °Problem: Health Behavior/Discharge Planning: °Goal: Ability to manage health-related needs will improve °Outcome: Progressing °  °Problem: Activity: °Goal: Risk for activity intolerance will decrease °Outcome: Progressing °  °Problem: Nutrition: °Goal: Adequate nutrition will be maintained °Outcome: Progressing °  °Problem: Elimination: °Goal: Will not experience complications related to bowel motility °Outcome: Progressing °  °

## 2021-03-07 NOTE — Progress Notes (Addendum)
Patient ID: Jon Martin, male   DOB: [birth year], 36 y.o.   MRN: [id number] P  Advanced Heart Failure VAD Team Note  PCP-Cardiologist: Dr. Gala Romney   Subjective:    1/23: Admitted for recurrent DL infection - WCx + staph aureus. BCx NGTD. Abd CT tranding in the anterior abdominal wall slightly above the umbilicus adjacent to lead extending to the left ventricular assist device, suggestive of possible cellulitis. No loculated fluid collection.  1/23: Transfused 1uRBC Hgb 7.0 1/24 Echo EF 40-45%  1/27: Driveline site debridement, wound vac placed. Cultures 1/27 +rare staph aureus  03/06/21: S/P VAD dressing change around driveline with ACell applications. Coumadin was restarted.   INR 1 LDH 129>209   Patient remains on cefazolin for MSSA.    No complaints. He tells me his niece would be able to give him antibiotics.    LVAD INTERROGATION:  HeartMate III LVAD:   Flow 3.8 liters/min, speed 5400 power 4, PI 4.4  Had few PI events.   Objective:    Vital Signs:   Temp:  [97.6 F (36.4 C)-98.7 F (37.1 C)] 98.5 F (36.9 C) (02/01 0413) Pulse Rate:  [67-103] 97 (02/01 0413) Resp:  [13-19] 19 (02/01 0413) BP: (84-105)/(63-89) 92/63 (02/01 0413) SpO2:  [96 %-100 %] 98 % (02/01 0413) Weight:  [53.8 kg] 53.8 kg (02/01 0526) Last BM Date: 03/01/21 Mean arterial Pressure 70s  Intake/Output:   Intake/Output Summary (Last 24 hours) at 03/07/2021 0832 Last data filed at 03/07/2021 6440 Gross per 24 hour  Intake 1484 ml  Output 1880 ml  Net -396 ml     Physical Exam     Physical Exam: GENERAL: No acute distress. HEENT: normal  NECK: Supple, JVP flat  .  2+ bilaterally, no bruits.  No lymphadenopathy or thyromegaly appreciated.   CARDIAC:  Mechanical heart sounds with LVAD hum present.  LUNGS:  Clear to auscultation bilaterally.  ABDOMEN:  Soft, round, nontender, positive bowel sounds x4.     LVAD exit site: VAC dressing in place around driveline.  No erythema or drainage.   Stabilization device present and accurately applied.  Driveline dressing is being changed daily per sterile technique. EXTREMITIES:  Warm and dry, no cyanosis, clubbing, rash or edema  NEUROLOGIC:  Alert and oriented x 3.    No aphasia.  No dysarthria.  Affect pleasant.     Telemetry   NSR 60s personally checked.   EKG   N/A   Labs   Basic Metabolic Panel: Recent Labs  Lab 03/01/21 0113 03/02/21 0059 03/03/21 0101 03/04/21 0048 03/05/21 0039 03/05/21 2133 03/06/21 0051  NA 134* 135 134* 139 138 133* 136  K 3.5 3.7 4.0 3.4* 3.6 3.6 3.9  CL 101 103 104 103 107 104 106  CO2 24 24 21* 28 25 23 23   GLUCOSE 104* 98 187* 99 98 106* 105*  BUN <5* <5* <5* 5* <5* <5* <5*  CREATININE 0.78 0.66 0.82 0.80 0.68 0.69 0.77  CALCIUM 8.9 8.6* 8.8* 8.9 8.8* 8.5* 8.9  MG 1.6* 2.0  --   --   --   --   --     Liver Function Tests: Recent Labs  Lab 03/05/21 2133  AST 43*  ALT 21  ALKPHOS 43  BILITOT 0.4  PROT 5.7*  ALBUMIN 2.9*   No results for input(s): LIPASE, AMYLASE in the last 168 hours. No results for input(s): AMMONIA in the last 168 hours.  CBC: Recent Labs  Lab 03/04/21 0048 03/05/21 0039 03/05/21 2133  03/06/21 0051 03/07/21 0535  WBC 8.3 6.5 6.6 6.2 5.3  HGB 8.4* 8.2* 7.8* 7.9* 8.1*  HCT 25.6* 25.7* 24.1* 24.5* 25.1*  MCV 94.1 94.1 94.1 93.9 93.7  PLT 222 219 215 208 225    INR: Recent Labs  Lab 03/04/21 0048 03/05/21 0039 03/05/21 2133 03/06/21 0051 03/07/21 0535  INR 0.9 1.0 1.0 1.0 1.0    Other results:    Imaging   No results found.   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   Assessment/Plan:    1. Recurrent DL Infection  - previous MSSA DL infection 1/56, treated w/ surgical debridement and IV abx - Now w/ recurrent infection. Wound Cx  + MSSA. BCx NGTD  - CT of C/A/P w/ stranding in the anterior abdominal wall slightly above the umbilicus adjacent to lead extending to the left ventricular assist device, suggestive of possible cellulitis. No loculated fluid collection.  - Abx narrowed cefazolin, will need 4 wks then chronic suppression.  -- Had wound debridement 1/27 with wound vac placement/ 1/31 VAC dressing change with A Cell application.   - ID following, appreciate their assistance  - Discussed with ID - oral linezolid BID for 3 weeks at discharge then switch to chronic cefadroxil afterwards    2. Chronic systolic HF due to severe NICM - Echo EF 10% with biventricular failure - HM-3 VAD implant 02/26/19 - Echo 7/21 EF 40-45%--> repeat this admit 40-45%.  - Volume status stable.  - Continue sildenafil - Off coreg and losartan.    3. VAD - Admit 12/21 with multiple low flow alarms. Rising LDH and decreasing PLTs. Suspected outflow graft kink occlusion. Patient placed on his left side and resolved.  - VAD interrogated personally. Parameters stable. - 1/31 Restarted coumadin . INR 1. Continue heparin drip.  -- LDH 129> 209   4. HTN - Stable.  - Off home Coreg and Losartan  5. Anemia - Admit Hgb 7.0 - Transfused 1 u RBC 1/23 - Hgb 8.1 oday, stable.  - Follow H/H   6. Hypokalemia -Check BMET in am. .   No PICC Discussed with ID- Oral linezolid BID for 3 weeks at discharge then switch to chronic cefadroxil afterwards   I reviewed the LVAD parameters from today, and compared the results to the patient's prior recorded data.  No programming changes were made.  The LVAD is functioning within specified parameters.  The patient performs LVAD  self-test daily.  LVAD interrogation was negative for any significant power changes, alarms or PI events/speed drops.  LVAD equipment check completed and is in good working order.  Back-up equipment present.   LVAD education done on emergency procedures and precautions and reviewed exit site care.  Length of Stay: 9  [name], NP 03/07/2021, 8:32 AM  VAD Team --- VAD ISSUES ONLY--- Pager [phone] (7am - 7am)  Advanced Heart Failure Team  Pager [phone] (M-F; 7a - 5p)  Please contact [facility] for night-coverage after  hours (5p -7a ) and weekends on [email or web address]   Patient seen and examined with the above-signed Advanced Practice Provider and/or Housestaff. I personally reviewed laboratory data, imaging studies and relevant notes. I independently examined the patient and formulated the important aspects of the plan. I have edited the note to reflect any of my changes or salient points. I have personally discussed the plan with the patient and/or family.  Underwent repeat debridement and wound vac change yesterday. Tolerated well. Remains on IV abx. Afebrile. On heparin/warfarin. INR 1.0. Discussed dosing with PharmD personally.   General:  NAD.  HEENT: normal  Neck: supple. JVP not elevated.  Carotids 2+ bilat; no bruits. No lymphadenopathy or thryomegaly appreciated. Cor: LVAD hum.  Lungs: Clear. Abdomen:  soft, nontender, non-distended. No hepatosplenomegaly. No bruits or masses. Good bowel sounds. Driveline site with wound vac in place Anchor in place.  Extremities: no cyanosis, clubbing, rash. Warm no edema  Neuro: alert & oriented x 3. No focal deficits. Moves all 4 without problem  \  Wound vac site looks good. Continue Ancef. Continue heparin/warfarin.   VAD interrogated personally. Parameters stable.  Volume status ok.   When INR >= 1.8 can stop heparin.   Arvilla Meres, MD  11:47 AM

## 2021-03-07 NOTE — Plan of Care (Signed)
°  Problem: Education: Goal: Patient will understand all VAD equipment and how it functions Outcome: Progressing Goal: Patient will be able to verbalize current INR target range and antiplatelet therapy for discharge home Outcome: Progressing   Problem: Cardiac: Goal: LVAD will function as expected and patient will experience no clinical alarms Outcome: Progressing   Problem: Education: Goal: Knowledge of General Education information will improve Description: Including pain rating scale, medication(s)/side effects and non-pharmacologic comfort measures Outcome: Progressing   Problem: Health Behavior/Discharge Planning: Goal: Ability to manage health-related needs will improve Outcome: Progressing   Problem: Activity: Goal: Risk for activity intolerance will decrease Outcome: Progressing   Problem: Nutrition: Goal: Adequate nutrition will be maintained Outcome: Progressing   Problem: Elimination: Goal: Will not experience complications related to bowel motility Outcome: Progressing

## 2021-03-07 NOTE — TOC Progression Note (Signed)
Transition of Care [facility]) - Progression Note    Patient Details  Name: Jon Martin MRN: [id number] Date of Birth: 1985-11-27  Transition of [facility]) CM/SW Contact  Almeda Ezra, LCSW Phone Number: 03/07/2021, 12:28 PM  Clinical Narrative:    HF CSW again [facility] and has yet to have any luck in getting in touch with anyone and the CSW has left several voicemail's. HF CSW called the main number [phone] and asked to speak with the admission's coordinator and was transferred to a voicemail and same with the office phone at [phone] and the cell phone [phone].  CSW will continue to follow throughout discharge.    Expected Discharge Plan: Home w Home Health Services Barriers to Discharge: Continued Medical Work up  Expected Discharge Plan and Services Expected Discharge Plan: Home w Home Health Services   Discharge Planning Services: CM Consult Post Acute Care Choice: Home Health Living arrangements for the past 2 months: Single Family Home                                       Social Determinants of Health (SDOH) Interventions    Readmission Risk Interventions No flowsheet data found.  Jon Martin, MSW, LCSWA [phone] Heart Failure Social Worker

## 2021-03-07 NOTE — Progress Notes (Signed)
LVAD Coordinator Rounding Note:  Admitted 02/26/21 due to Dr. Clayborne Dana service for VAD drive line infection.   HM III LVAD implanted on 02/26/19 by Dr. Darcey Nora under Destination Therapy criteria.  Patient laying in bed watching TV. Denies complaints; says he is planning on taking nap this morning.    Pt may need placement at SNF in [place], [place] at discharge if discharged with wound vac and/or IV antibiotics. IP CSW and case manager are trying to contact [facility].  Patient says his niece has agreed to come in for training per Carolynn Sayers for home IV antibiotics. He says she works 5a - 11a daily, but has recently moved back in with patient. Contact info is:  Diona Fanti (niece)  [phone] Updated Dr. Haroldine Laws about potential caregiver for home antibiotics and he agrees with plan. Discharge home when INR therapeutic.   Patient will need to come to VAD Clinic weekly for wound vac dressing changes.   Vital signs: Temp: 98.7 HR: 76 Doppler Pressure: 78 Automatic BP: 98/72 (77) O2 Sat: 96% RA  Wt: 111.9>115.3>115.5>115>113.5>110.2>118 lbs  LVAD interrogation reveals:  Speed: 5400 Flow: 3.7 Power: 3.8w PI: 4.2 Hct: 20 - DO NOT CHANGE   Alarms: none Events: 45 PI events yesterday  Fixed speed: 5400 Low speed limit: 5100  Drive Line/Abdominal Abcsess:  Wound vac dressing clean, dry, and intact. Minimal serosanguinous drainage in wound vac canister. No leak alarms noted.    Labs:  LDH trend: 154>145>172>163>155>132>209  INR trend: 2.3>1.4>1.1>1.0>1.0>1.0>1.0  Hgb trend: 7.0>8.4>9.3>8.9>8.3>8.2>7.9>8.1  Anticoagulation Plan: -INR Goal: 2.0 - 2.5 -ASA Dose: none  Blood Products:  - 02/26/21>>1 units PCs  Device: N/A  Infection:  02/26/21 Drive line wound culture>> few staph aureus; final  02/26/21 Abdominal abscess culture>> few staph aureus; final  02/26/21 Blood cultures >> no growth 5 days; final   Drips:  Heparin 400  units/hr  Plan/Recommendations:  1. Call VAD Coordinator if any VAD equipment or drive line issues. 3. Wound VAC changes per Dr. Thayer Ohm instructions.  Zada Girt RN [place] Coordinator  Office: [phone]  24/7 Pager: [phone]

## 2021-03-07 NOTE — Progress Notes (Signed)
ANTICOAGULATION CONSULT NOTE  Pharmacy Consult for heparin>warfarin Indication:  LVAD  No Known Allergies  Patient Measurements: Height: 5\' 6"  (167.6 cm) Weight: 53.8 kg (118 lb 9.7 oz) IBW/kg (Calculated) : 63.8 Heparin Dosing Weight: 52kg  Vital Signs: Temp: 98.7 F (37.1 C) (02/01 1100) Temp Source: Oral (02/01 1100) BP: 129/76 (02/01 1100) Pulse Rate: 76 (02/01 1100)  Labs: Recent Labs    03/05/21 0039 03/05/21 2133 03/06/21 0051 03/07/21 0535  HGB 8.2* 7.8* 7.9* 8.1*  HCT 25.7* 24.1* 24.5* 25.1*  PLT 219 215 208 225  APTT  --  30  --   --   LABPROT 12.9 12.9 13.4 13.5  INR 1.0 1.0 1.0 1.0  HEPARINUNFRC <0.10*  --  <0.10* <0.10*  CREATININE 0.68 0.69 0.77  --      Estimated Creatinine Clearance: 98.1 mL/min (by C-G formula based on SCr of 0.77 mg/dL).   Medical History: Past Medical History:  Diagnosis Date   Dilated cardiomyopathy (HCC)    Dilated cardiomyopathy (HCC) 02/2019   Polysubstance abuse (HCC)     Assessment: 6 yoM with HM3 LVAD on chronic warfarin admitted with DLI.  S/p I&D 1/27, heparin held overnight. Resumed 1/28 am.   Heparin level undetectable as expected on low fixed dose heparin at 400 units/hr. CBC is low but stable, LDH stable. Wound vac change 1/31. Anticoagulation resumed post op. INR is 1.0.  Prior to admit dose was 2.5 mg (5 mg x 0.5) every Sun, Tue, Thu; 5 mg (5 mg x 1) all other days  Goal of Therapy:  Heparin level <0.3 units/ml Monitor platelets by anticoagulation protocol: Yes   Plan:  Will give warfarin 5mg  again tonight Continue daily INR checks Heparin 400 units/h -fixed rate - restart at 8p  04-30-1990 PharmD., BCPS Clinical Pharmacist 03/07/2021 1:39 PM

## 2021-03-07 NOTE — TOC Benefit Eligibility Note (Addendum)
Patient Advocate Encounter   Received notification that prior authorization for linezolid (Zyvox) 600 mg tablets is required.   PA submitted on 03/07/2021 Key [id number] Status is pending       Roland Earl, CPhT Pharmacy Patient Advocate Specialist [facility] Patient Advocate Team Direct Number: [phone]  Fax: [phone]

## 2021-03-08 ENCOUNTER — Other Ambulatory Visit (HOSPITAL_COMMUNITY): Payer: Self-pay

## 2021-03-08 DIAGNOSIS — T827XXA Infection and inflammatory reaction due to other cardiac and vascular devices, implants and grafts, initial encounter: Secondary | ICD-10-CM | POA: Diagnosis not present

## 2021-03-08 LAB — BASIC METABOLIC PANEL
Anion gap: 7 (ref 5–15)
BUN: 6 mg/dL (ref 6–20)
CO2: 25 mmol/L (ref 22–32)
Calcium: 8.6 mg/dL — ABNORMAL LOW (ref 8.9–10.3)
Chloride: 105 mmol/L (ref 98–111)
Creatinine, Ser: 0.94 mg/dL (ref 0.61–1.24)
GFR, Estimated: >60 mL/min (ref >60)
Glucose, Bld: 100 mg/dL — ABNORMAL HIGH (ref 70–99)
Potassium: 3.3 mmol/L — ABNORMAL LOW (ref 3.5–5.1)
Sodium: 137 mmol/L (ref 135–145)

## 2021-03-08 LAB — ACID FAST SMEAR (AFB, MYCOBACTERIA): Acid Fast Smear: NEGATIVE

## 2021-03-08 LAB — PROTIME-INR
INR: 1.1 (ref 0.8–1.2)
Prothrombin Time: 14 seconds (ref 11.4–15.2)

## 2021-03-08 LAB — CBC
HCT: 23.9 % — ABNORMAL LOW (ref 39.0–52.0)
Hemoglobin: 7.8 g/dL — ABNORMAL LOW (ref 13.0–17.0)
MCH: 30.4 pg (ref 26.0–34.0)
MCHC: 32.6 g/dL (ref 30.0–36.0)
MCV: 93 fL (ref 80.0–100.0)
Platelets: 234 10*3/uL (ref 150–400)
RBC: 2.57 MIL/uL — ABNORMAL LOW (ref 4.22–5.81)
RDW: 16.7 % — ABNORMAL HIGH (ref 11.5–15.5)
WBC: 6.8 10*3/uL (ref 4.0–10.5)
nRBC: 0 % (ref 0.0–0.2)

## 2021-03-08 LAB — HEPARIN LEVEL (UNFRACTIONATED): Heparin Unfractionated: <0.1 IU/mL — ABNORMAL LOW (ref 0.30–0.70)

## 2021-03-08 LAB — LACTATE DEHYDROGENASE: LDH: 156 U/L (ref 98–192)

## 2021-03-08 MED ORDER — WARFARIN SODIUM 7.5 MG PO TABS
7.5000 mg | ORAL_TABLET | Freq: Once | ORAL | Status: AC
  Administered 2021-03-08: 7.5 mg via ORAL
  Filled 2021-03-08: qty 1

## 2021-03-08 MED ORDER — POTASSIUM CHLORIDE CRYS ER 20 MEQ PO TBCR
40.0000 meq | EXTENDED_RELEASE_TABLET | Freq: Once | ORAL | Status: AC
  Administered 2021-03-08: 40 meq via ORAL
  Filled 2021-03-08: qty 2

## 2021-03-08 NOTE — Plan of Care (Signed)
°  Problem: Clinical Measurements: °Goal: Ability to maintain clinical measurements within normal limits will improve °Outcome: Progressing °  °Problem: Activity: °Goal: Risk for activity intolerance will decrease °Outcome: Progressing °  °Problem: Nutrition: °Goal: Adequate nutrition will be maintained °Outcome: Progressing °  °

## 2021-03-08 NOTE — Progress Notes (Signed)
[place] for heparin>warfarin Indication:  LVAD  No Known Allergies  Patient Measurements: Height: 5\' 6"  (167.6 cm) Weight: 53.5 kg (117 lb 15.1 oz) IBW/kg (Calculated) : 63.8 Heparin Dosing Weight: 52kg  Vital Signs: Temp: 98.1 F (36.7 C) (02/02 1055) Temp Source: Oral (02/02 1055) BP: 92/71 (02/02 0756) Pulse Rate: 94 (02/02 0352)  Labs: Recent Labs    03/05/21 2133 03/06/21 0051 03/07/21 0535 03/08/21 0041  HGB 7.8* 7.9* 8.1* 7.8*  HCT 24.1* 24.5* 25.1* 23.9*  PLT 215 208 225 234  APTT 30  --   --   --   LABPROT 12.9 13.4 13.5 14.0  INR 1.0 1.0 1.0 1.1  HEPARINUNFRC  --  <0.10* <0.10* <0.10*  CREATININE 0.69 0.77  --  0.94     Estimated Creatinine Clearance: 83 mL/min (by C-G formula based on SCr of 0.94 mg/dL).   Medical History: Past Medical History:  Diagnosis Date   Dilated cardiomyopathy ([place])    Dilated cardiomyopathy ([place]) 02/2019   Polysubstance abuse ([place])     Assessment: 89 yoM with HM3 LVAD on chronic warfarin admitted with DLI.  S/p I&D 1/27, heparin held overnight. Resumed 1/28 am.   Heparin level undetectable as expected on low fixed dose heparin at 400 units/hr. CBC is low but stable, LDH stable. Wound vac change 1/31. Anticoagulation resumed post op. INR is 1.1 - diet improved in hospital  - increased warfarin requirements.  Prior to admit dose was 2.5 mg (5 mg x 0.5) every Sun, Tue, Thu; 5 mg (5 mg x 1) all other days  Goal of Therapy:  Heparin level <0.3 units/ml Monitor platelets by anticoagulation protocol: Yes   Plan:  Will give warfarin 7.5mg  x1  Continue daily INR checks Heparin 400 units/h -fixed rate - restart at [place].D. CPP, BCPS Clinical Pharmacist [phone] 03/08/2021 2:29 PM

## 2021-03-08 NOTE — Progress Notes (Addendum)
Mobility Specialist: Progress Note   03/08/21 1551  Mobility  Activity Ambulated independently in hallway  Level of Assistance Independent  Assistive Device  (IV Pole)  Distance Ambulated (ft) 800 ft  Activity Response Tolerated well  $Mobility charge 1 Mobility   Pre-Mobility: 91 HR  Received pt in bed having no complaints and agreeable to mobility. Asymptomatic throughout ambulation, returned back to bed w/ call bell in reach and all needs met.  [facility] Jon Martin Mobility Specialist Mobility Specialist 4 [place]: [phone] Mobility Specialist 2 [place] and [place]: [phone]

## 2021-03-08 NOTE — Progress Notes (Signed)
Brief ID Note:   Linezolid prior authorization initially rejected for Mr. Karner.  Will re-submit and provide update when we have one.   INR 1.1 and continues on IV cefazolin and heparin bridge.    Rexene Alberts, MSN, NP-C [facility]  [email or web address] Pager: [phone] Office: [phone] [facility] Main Line: [phone]

## 2021-03-08 NOTE — Progress Notes (Addendum)
Patient ID: Jarmall Santmyer, male   DOB: 1985/04/13, 36 y.o.   MRN: [id number]  Advanced Heart Failure VAD Team Note  PCP-Cardiologist: Dr. Haroldine Laws   Subjective:    1/23: Admitted for recurrent DL infection - WCx + staph aureus. BCx NGTD. Abd CT tranding in the anterior abdominal wall slightly above the umbilicus adjacent to lead extending to the left ventricular assist device, suggestive of possible cellulitis. No loculated fluid collection.  1/23: Transfused 1uRBC Hgb 7.0 1/24 Echo EF 40-45%  1/27: Driveline site debridement, wound vac placed. Cultures 1/27 +rare staph aureus  03/06/21: S/P VAD dressing change around driveline with ACell applications. Coumadin was restarted.   Remains cefazolin. ID recommending. Oral linezolid BID for 3 weeks at discharge then switch to chronic cefadroxil afterwards   INR 1.1 LDH 129>209 >1.56  Denies SOB.    LVAD INTERROGATION:  HeartMate III LVAD:   Flow 3.7liters/min, speed 5400 power 4, PI 4.6 Had few PI events.   Objective:    Vital Signs:   Temp:  [98.1 F (36.7 C)-98.8 F (37.1 C)] 98.1 F (36.7 C) (02/02 0352) Pulse Rate:  [76-97] 94 (02/02 0352) Resp:  [16-20] 16 (02/02 0352) BP: (72-129)/(52-76) 83/73 (02/02 0404) SpO2:  [96 %-100 %] 97 % (02/02 0352) Weight:  [53.5 kg] 53.5 kg (02/02 0549) Last BM Date: 03/01/21 Mean arterial Pressure 70s  Intake/Output:   Intake/Output Summary (Last 24 hours) at 03/08/2021 0753 Last data filed at 03/08/2021 0557 Gross per 24 hour  Intake 1054.26 ml  Output 1525 ml  Net -470.74 ml     Physical Exam    Physical Exam: GENERAL: No acute distress. HEENT: normal  NECK: Supple, JVP flat .  2+ bilaterally, no bruits.  No lymphadenopathy or thyromegaly appreciated.   CARDIAC:  Mechanical heart sounds with LVAD hum present.  LUNGS:  Clear to auscultation bilaterally.  ABDOMEN:  Soft, round, nontender, positive bowel sounds x4.     LVAD exit site:VAD dressing in place around PICC.   No  erythema or drainage.  Stabilization device present and accurately applied.  Driveline dressing is being changed daily per sterile technique. EXTREMITIES:  Warm and dry, no cyanosis, clubbing, rash or edema  NEUROLOGIC:  Alert and oriented x 3.    No aphasia.  No dysarthria.  Affect pleasant.      Telemetry   NSR 60s   EKG   N/A   Labs   Basic Metabolic Panel: Recent Labs  Lab 03/02/21 0059 03/03/21 0101 03/04/21 0048 03/05/21 0039 03/05/21 2133 03/06/21 0051 03/08/21 0041  NA 135   < > 139 138 133* 136 137  K 3.7   < > 3.4* 3.6 3.6 3.9 3.3*  CL 103   < > 103 107 104 106 105  CO2 24   < > 28 25 23 23 25   GLUCOSE 98   < > 99 98 106* 105* 100*  BUN <5*   < > 5* <5* <5* <5* 6  CREATININE 0.66   < > 0.80 0.68 0.69 0.77 0.94  CALCIUM 8.6*   < > 8.9 8.8* 8.5* 8.9 8.6*  MG 2.0  --   --   --   --   --   --    < > = values in this interval not displayed.    Liver Function Tests: Recent Labs  Lab 03/05/21 2133  AST 43*  ALT 21  ALKPHOS 43  BILITOT 0.4  PROT 5.7*  ALBUMIN 2.9*   No results  for input(s): LIPASE, AMYLASE in the last 168 hours. No results for input(s): AMMONIA in the last 168 hours.  CBC: Recent Labs  Lab 03/05/21 0039 03/05/21 2133 03/06/21 0051 03/07/21 0535 03/08/21 0041  WBC 6.5 6.6 6.2 5.3 6.8  HGB 8.2* 7.8* 7.9* 8.1* 7.8*  HCT 25.7* 24.1* 24.5* 25.1* 23.9*  MCV 94.1 94.1 93.9 93.7 93.0  PLT 219 215 208 225 234    INR: Recent Labs  Lab 03/05/21 0039 03/05/21 2133 03/06/21 0051 03/07/21 0535 03/08/21 0041  INR 1.0 1.0 1.0 1.0 1.1    Other results:    Imaging   No results found.   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   Assessment/Plan:    1. Recurrent DL Infection  - previous MSSA DL infection 3/22, treated w/ surgical debridement and IV abx - Now w/ recurrent infection. Wound Cx  + MSSA. BCx NGTD  - CT of C/A/P w/ stranding in the anterior abdominal wall slightly above the umbilicus adjacent to lead extending to the left ventricular assist device, suggestive of possible cellulitis. No loculated fluid collection.  - Abx narrowed cefazolin -- Had wound debridement 1/27 with wound vac placement/ 1/31 VAC dressing change with A Cell application.   - ID following, appreciate their assistance  - Discussed with ID - oral linezolid BID for 3 weeks at discharge then switch to chronic cefadroxil afterwards    2. Chronic systolic HF due to severe NICM - Echo EF 10% with biventricular failure - HM-3 VAD implant 02/26/19 - Echo 7/21 EF 40-45%--> repeat this admit 40-45%.  - Volume status stable.  - Continue sildenafil - Off coreg and losartan.    3. VAD - Admit 12/21 with multiple low flow alarms. Rising LDH and decreasing PLTs. Suspected outflow graft kink occlusion. Patient placed on his left side and resolved.  - VAD interrogated personally. Parameters stable. - 1/31 Restarted coumadin . INR 1.1 Continue heparin drip until INR 1.8   -- LDH 129> 209>156   4. HTN - Stable.  - Off home Coreg and Losartan  5. Anemia - Admit Hgb 7.0 - Transfused 1 u RBC 1/23 - Hgb 8.1>7.8.  - CBC in am   6. Hypokalemia -Supp K .   I reviewed the LVAD parameters from today, and compared the results to the patient's prior recorded data.  No programming changes were made.  The LVAD is functioning within specified parameters.  The patient performs LVAD self-test daily.  LVAD interrogation was negative for any significant power changes, alarms or PI  events/speed drops.  LVAD equipment check completed and is in good working order.  Back-up equipment present.   LVAD education done on emergency procedures and precautions and reviewed exit site care.  Length of Stay: [place], NP 03/08/2021, 7:53 AM  VAD Team --- VAD ISSUES ONLY--- Pager [phone] (7am - 7am)  Advanced Heart Failure Team  Pager [phone] (M-F; 7a - 5p)  Please contact [facility] Cardiology  for night-coverage after hours (5p -7a ) and weekends on [email or web address]   Patient seen and examined with the above-signed Advanced Practice Provider and/or Housestaff. I personally reviewed laboratory data, imaging studies and relevant notes. I independently examined the patient and formulated the important aspects of the plan. I have edited the note to reflect any of my changes or salient points. I have personally discussed the plan with the patient and/or family.  Remains on IV abx. Wound vac in place. Working well. On heparin/warfarin. INR 1.1. No bleeding  General:  NAD.  HEENT: normal  Neck: supple. JVP not elevated.  Carotids 2+ bilat; no bruits. No lymphadenopathy or thryomegaly appreciated. Cor: LVAD hum.  Lungs: Clear. Abdomen: obese soft, nontender, non-distended. No hepatosplenomegaly. No bruits or masses. Good bowel sounds. Vac site ok Anchor in place.  Extremities: no cyanosis, clubbing, rash. Warm no edema  Neuro: alert & oriented x 3. No focal deficits. Moves all 4 without problem   Wound site ok. Continue IV abx and vac. Continue heparin/warfarin. Discussed dosing with PharmD personally. Volume status ok. VAD interrogated personally. Parameters stable.  Glori Bickers, MD  9:21 AM

## 2021-03-08 NOTE — TOC Progression Note (Signed)
Transition of Care [facility]) - Progression Note    Patient Details  Name: Jon Martin MRN: [id number] Date of Birth: 04/30/1985  [facility]) CM/SW Contact  Cristle Jared, LCSW Phone Number: 03/08/2021, 2:25 PM  Clinical Narrative:    HF RNCM Cathlean Cower was able to get in touch with Inetta Fermo at [facility] and she reported that she hasn't received the referral that was sent over last week and to fax it to them again to her directly at [phone]. [facility] again faxed over clinicals for their DON to take a look.  CSW will continue to follow throughout discharge.    Expected Discharge Plan: Home w Home Health Services Barriers to Discharge: Continued Medical Work up  Expected Discharge Plan and Services Expected Discharge Plan: Home w Home Health Services   Discharge Planning Services: CM Consult Post Acute Care Choice: Home Health Living arrangements for the past 2 months: Single Family Home                                       Social Determinants of Health (SDOH) Interventions    Readmission Risk Interventions No flowsheet data found.

## 2021-03-08 NOTE — Progress Notes (Signed)
LVAD Coordinator Rounding Note:  Admitted 02/26/21 due to Dr. Prescott Gum service for VAD drive line infection.   HM III LVAD implanted on 02/26/19 by Dr. Maren Beach under Destination Therapy criteria.  Patient laying in bed watching TV. Denies complaints. His INR remains subtherapeutic with Heparin gtt.  Patient may be going home on PO antibiotics if authorization can be obtained from insurance company. ID is currently working on that plan.     Patient confirms his niece is still available for education if home IV therapy is required.   Confirmed with patient he will need to come to VAD Clinic weekly for wound vac dressing changes. These will be scheduled every Monday at 11:00 when Dr. Alla German will be available. This also allows patient 3 days prior notification time for him to arrange transportation. Pt verbalized understanding and agreement to plan.   Vital signs: Temp: 98.1 HR: 94 Doppler Pressure: 80 Automatic BP: 92/71 (79) O2 Sat: 97% RA  Wt: 111.9>115.3>115.5>115>113.5>110.2>118>117.9 lbs  LVAD interrogation reveals:  Speed: 5400 Flow: 3.8 Power:  3.7w PI: 4.0 Hct: 20 - DO NOT CHANGE   Alarms: none Events: 30 PI events yesterday  Fixed speed: 5400 Low speed limit: 5100  Drive Line/Abdominal Abcsess:  Wound vac dressing clean, dry, and intact. Minimal serosanguinous drainage in wound vac canister. No leak alarms noted.    Labs:  LDH trend: 154>145>172>163>155>132>209>156  INR trend: 2.3>1.4>1.1>1.0>1.0>1.0>1.0>1.1  Hgb trend: 7.0>8.4>9.3>8.9>8.3>8.2>7.9>8.1>7.8  Anticoagulation Plan: -INR Goal: 2.0 - 2.5 -ASA Dose: none  Blood Products:  - 02/26/21>>1 units PCs  Device: N/A  Infection:  02/26/21 Drive line wound culture>> few staph aureus; final  02/26/21 Abdominal abscess culture>> few staph aureus; final  02/26/21 Blood cultures >> no growth 5 days; final   Drips:  Heparin 400 units/hr  Plan/Recommendations:  1. Call VAD Coordinator if any VAD  equipment or drive line issues. 3. Wound VAC changes per Dr. Lorrin Mais instructions.  [name] RN VAD Coordinator  Office: [phone]  24/7 Pager: [phone]

## 2021-03-09 ENCOUNTER — Other Ambulatory Visit (HOSPITAL_COMMUNITY): Payer: Self-pay

## 2021-03-09 DIAGNOSIS — I5022 Chronic systolic (congestive) heart failure: Secondary | ICD-10-CM | POA: Diagnosis not present

## 2021-03-09 DIAGNOSIS — T827XXA Infection and inflammatory reaction due to other cardiac and vascular devices, implants and grafts, initial encounter: Secondary | ICD-10-CM | POA: Diagnosis not present

## 2021-03-09 LAB — BASIC METABOLIC PANEL
Anion gap: 7 (ref 5–15)
BUN: <5 mg/dL — ABNORMAL LOW (ref 6–20)
CO2: 24 mmol/L (ref 22–32)
Calcium: 8.8 mg/dL — ABNORMAL LOW (ref 8.9–10.3)
Chloride: 107 mmol/L (ref 98–111)
Creatinine, Ser: 0.64 mg/dL (ref 0.61–1.24)
GFR, Estimated: >60 mL/min (ref >60)
Glucose, Bld: 96 mg/dL (ref 70–99)
Potassium: 3.3 mmol/L — ABNORMAL LOW (ref 3.5–5.1)
Sodium: 138 mmol/L (ref 135–145)

## 2021-03-09 LAB — CBC
HCT: 25.9 % — ABNORMAL LOW (ref 39.0–52.0)
Hemoglobin: 8 g/dL — ABNORMAL LOW (ref 13.0–17.0)
MCH: 29.1 pg (ref 26.0–34.0)
MCHC: 30.9 g/dL (ref 30.0–36.0)
MCV: 94.2 fL (ref 80.0–100.0)
Platelets: 222 10*3/uL (ref 150–400)
RBC: 2.75 MIL/uL — ABNORMAL LOW (ref 4.22–5.81)
RDW: 16.9 % — ABNORMAL HIGH (ref 11.5–15.5)
WBC: 5.5 10*3/uL (ref 4.0–10.5)
nRBC: 0 % (ref 0.0–0.2)

## 2021-03-09 LAB — HEPARIN LEVEL (UNFRACTIONATED): Heparin Unfractionated: <0.1 IU/mL — ABNORMAL LOW (ref 0.30–0.70)

## 2021-03-09 LAB — LACTATE DEHYDROGENASE: LDH: 226 U/L — ABNORMAL HIGH (ref 98–192)

## 2021-03-09 LAB — PROTIME-INR
INR: 1.1 (ref 0.8–1.2)
Prothrombin Time: 14.1 seconds (ref 11.4–15.2)

## 2021-03-09 MED ORDER — BOOST PO LIQD
237.0000 mL | Freq: Three times a day (TID) | ORAL | Status: DC
  Administered 2021-03-09 – 2021-03-16 (×19): 237 mL via ORAL
  Filled 2021-03-09 (×23): qty 237

## 2021-03-09 MED ORDER — POTASSIUM CHLORIDE CRYS ER 20 MEQ PO TBCR
40.0000 meq | EXTENDED_RELEASE_TABLET | Freq: Four times a day (QID) | ORAL | Status: DC
  Administered 2021-03-09 – 2021-03-13 (×15): 40 meq via ORAL
  Filled 2021-03-09 (×15): qty 2

## 2021-03-09 MED ORDER — WARFARIN SODIUM 7.5 MG PO TABS
7.5000 mg | ORAL_TABLET | Freq: Once | ORAL | Status: AC
  Administered 2021-03-09: 7.5 mg via ORAL
  Filled 2021-03-09: qty 1

## 2021-03-09 NOTE — Progress Notes (Addendum)
LVAD Coordinator Rounding Note:  Admitted 02/26/21 due to Dr. Prescott Gum service for VAD drive line infection.   HM III LVAD implanted on 02/26/19 by Dr. Maren Beach under Destination Therapy criteria.  Patient laying in bed watching TV. Denies complaints. His INR remains subtherapeutic with Heparin gtt.  Explained patient is scheduled at 9:30 am Monday, 03/12/21 with Dr. Maren Beach for wound debridement in OR. Patient verbalizes understanding of same.  Discussed how patient will get home when discharged, he has required Benedetto Goad in past. He says his niece may be able to pick him up; potential discharge next week when INR therapeutic.   Vital signs: Temp: 98.4 HR: 78 Doppler Pressure: 78 Automatic BP:  92/75 (79) O2 Sat: 100% RA Wt: 111.9>115.3>115.5>115>113.5>110.2>118>117.9>120.3 lbs  LVAD interrogation reveals:  Speed: 5400 Flow: 3.8 Power:  3.8w PI: 3.7 Hct: 20 - DO NOT CHANGE   Alarms: none Events: 70 PI events yesterday  Fixed speed: 5400 Low speed limit: 5100  Drive Line/Abdominal Abcsess:  Wound vac dressing clean, dry, and intact. Minimal serosanguinous drainage in wound vac canister. No leak alarms noted.    Labs:  LDH trend: 154>145>172>163>155>132>209>156>226  INR trend: 2.3>1.4>1.1>1.0>1.0>1.0>1.0>1.1>1.1  Hgb trend: 7.0>8.4>9.3>8.9>8.3>8.2>7.9>8.1>7.8>8.0  Anticoagulation Plan: -INR Goal: 2.0 - 2.5 -ASA Dose: none  Blood Products:  - 02/26/21>>1 units PCs  Device: N/A  Infection:  02/26/21 Drive line wound culture>> few staph aureus; final  02/26/21 Abdominal abscess culture>> few staph aureus; final  02/26/21 Blood cultures >> no growth 5 days; final   Drips:  Heparin 400 units/hr  Plan/Recommendations:  1. Call VAD Coordinator if any VAD equipment or drive line issues. 2. Wound VAC changes per Dr. Lorrin Mais instructions. 3. Patient scheduled for wound vac change in OR per Dr. Maren Beach, Monday, 03/12/21 at 9:30 am. VAD Coordinator will accompany and  remain with patient during procedure.    [name] RN VAD Coordinator  Office: [phone]  24/7 Pager: [phone]

## 2021-03-09 NOTE — Plan of Care (Signed)
  Problem: Education: Goal: Patient will understand all VAD equipment and how it functions Outcome: Progressing Goal: Patient will be able to verbalize current INR target range and antiplatelet therapy for discharge home Outcome: Progressing   Problem: Cardiac: Goal: LVAD will function as expected and patient will experience no clinical alarms Outcome: Progressing   Problem: Education: Goal: Knowledge of General Education information will improve Description: Including pain rating scale, medication(s)/side effects and non-pharmacologic comfort measures Outcome: Progressing   Problem: Health Behavior/Discharge Planning: Goal: Ability to manage health-related needs will improve Outcome: Progressing   Problem: Clinical Measurements: Goal: Ability to maintain clinical measurements within normal limits will improve Outcome: Progressing Goal: Will remain free from infection Outcome: Progressing Goal: Diagnostic test results will improve Outcome: Progressing Goal: Respiratory complications will improve Outcome: Progressing Goal: Cardiovascular complication will be avoided Outcome: Progressing   Problem: Activity: Goal: Risk for activity intolerance will decrease Outcome: Progressing   Problem: Nutrition: Goal: Adequate nutrition will be maintained Outcome: Progressing   Problem: Coping: Goal: Level of anxiety will decrease Outcome: Progressing   Problem: Elimination: Goal: Will not experience complications related to bowel motility Outcome: Progressing Goal: Will not experience complications related to urinary retention Outcome: Progressing   Problem: Pain Managment: Goal: General experience of comfort will improve Outcome: Progressing   Problem: Safety: Goal: Ability to remain free from injury will improve Outcome: Progressing   Problem: Skin Integrity: Goal: Risk for impaired skin integrity will decrease Outcome: Progressing   

## 2021-03-09 NOTE — Progress Notes (Signed)
Mobility Specialist: Progress Note   03/09/21 1348  Mobility  Activity Ambulated independently in hallway  Level of Assistance Independent  Assistive Device  (IV Pole)  Distance Ambulated (ft) 800 ft  Activity Response Tolerated well  $Mobility charge 1 Mobility   Post-Mobility: 89 HR  Received pt in bed having no complaints and agreeable to mobility. Asymptomatic throughout ambulation, returned back to bed w/ call bell in reach and all needs met.  [facility] Tiare Rohlman Mobility Specialist Mobility Specialist 4 [place]: [phone] Mobility Specialist 2 [place] and [place]: [phone]

## 2021-03-09 NOTE — TOC CM/SW Note (Addendum)
03/09/2021 1010 am. TOC CM spoke to niece and states she lives in home and willing to administer IV abx. [employer] will complete training with niece for home IV abx. Jonnie Finner RN3 CCM, Heart Failure TOC CM [phone]   HF TOC CM spoke to [employer] IV infusion coordinator, Pam RN to update the plan is home with IV abx. Pt's niece is going to assist. Waiting to see if pt is approved for po abx. [place], Heart Failure TOC CM [phone]

## 2021-03-09 NOTE — Progress Notes (Signed)
ANTICOAGULATION CONSULT NOTE  Pharmacy Consult for heparin>warfarin Indication:  LVAD  No Known Allergies  Patient Measurements: Height: 5\' 6"  (167.6 cm) Weight: 54.6 kg (120 lb 5.9 oz) IBW/kg (Calculated) : 63.8 Heparin Dosing Weight: 52kg  Vital Signs: Temp: 98.4 F (36.9 C) (02/03 0758) Temp Source: Oral (02/03 0758) BP: 91/73 (02/03 0758) Pulse Rate: 78 (02/03 0758)  Labs: Recent Labs    03/07/21 0535 03/08/21 0041 03/09/21 0057  HGB 8.1* 7.8* 8.0*  HCT 25.1* 23.9* 25.9*  PLT 225 234 222  LABPROT 13.5 14.0 14.1  INR 1.0 1.1 1.1  HEPARINUNFRC <0.10* <0.10* <0.10*  CREATININE  --  0.94 0.64     Estimated Creatinine Clearance: 99.5 mL/min (by C-G formula based on SCr of 0.64 mg/dL).   Medical History: Past Medical History:  Diagnosis Date   Dilated cardiomyopathy (HCC)    Dilated cardiomyopathy (HCC) 02/2019   Polysubstance abuse (HCC)     Assessment: 33 yoM with HM3 LVAD on chronic warfarin admitted with DLI.  S/p I&D 1/27, heparin held overnight. Resumed 1/28 am.   Heparin level undetectable as expected on low fixed dose heparin at 400 units/hr. CBC is low but stable, LDH up to 226 today. Wound vac change 1/31, planning back to OR 2/6. INR low at 1.1  Prior to admit dose was 2.5 mg (5 mg x 0.5) every Sun, Tue, Thu; 5 mg (5 mg x 1) all other days  Goal of Therapy:  Heparin level <0.3 units/ml Monitor platelets by anticoagulation protocol: Yes   Plan:  Will give warfarin 7.5mg  again today Continue daily INR checks Heparin 400 units/h -fixed rate - restart at 8p  04-30-1990 PharmD., BCPS Clinical Pharmacist 03/09/2021 10:56 AM

## 2021-03-09 NOTE — Progress Notes (Addendum)
Patient ID: Jyheim Arntzen, male   DOB: 03/20/1985, 36 y.o.   MRN: [id number]  Advanced Heart Failure VAD Team Note  PCP-Cardiologist: Dr. Haroldine Laws   Subjective:    1/23: Admitted for recurrent DL infection - WCx + staph aureus. BCx NGTD. Abd CT tranding in the anterior abdominal wall slightly above the umbilicus adjacent to lead extending to the left ventricular assist device, suggestive of possible cellulitis. No loculated fluid collection.  1/23: Transfused 1uRBC Hgb 7.0 1/24 Echo EF 40-45%  1/27: Driveline site debridement, wound vac placed. Cultures 1/27 +rare staph aureus  03/06/21: S/P VAD dressing change around driveline with ACell applications. Coumadin was restarted.   Remains cefazolin. ID recommending--> Oral linezolid BID for 3 weeks at discharge then switch to chronic cefadroxil afterwards . Checking PA>   INR 1.1 LDH 129>209 >156>226  Denies SOB.    LVAD INTERROGATION:  HeartMate III LVAD:   Flow 3.6 liters/min, speed 5400 power 4, PI 4 Had few PI events.   Objective:    Vital Signs:   Temp:  [98 F (36.7 C)-98.8 F (37.1 C)] 98.4 F (36.9 C) (02/03 0758) Pulse Rate:  [78-89] 78 (02/03 0758) Resp:  [12-20] 17 (02/03 0758) BP: (85-101)/(51-76) 91/73 (02/03 0758) SpO2:  [96 %-100 %] 100 % (02/03 0758) Weight:  [54.6 kg] 54.6 kg (02/03 0524) Last BM Date: 03/01/21 Mean arterial Pressure 70-80 Intake/Output:   Intake/Output Summary (Last 24 hours) at 03/09/2021 1050 Last data filed at 03/09/2021 0347 Gross per 24 hour  Intake 1243.08 ml  Output 600 ml  Net 643.08 ml     Physical Exam     Physical Exam: GENERAL: No acute distress. HEENT: normal  NECK: Supple, JVP  flat .  2+ bilaterally, no bruits.  No lymphadenopathy or thyromegaly appreciated.   CARDIAC:  Mechanical heart sounds with LVAD hum present.  LUNGS:  Clear to auscultation bilaterally.  ABDOMEN:  Soft, round, nontender, positive bowel sounds x4.     LVAD exit site: VAC dressing. Dressing dry  and intact.  No erythema or drainage.  Stabilization device present and accurately applied.  Driveline dressing is being changed daily per sterile technique. EXTREMITIES:  Warm and dry, no cyanosis, clubbing, rash or edema  NEUROLOGIC:  Alert and oriented x 3.    No aphasia.  No dysarthria.  Affect pleasant.    .      Telemetry   SR 60s   EKG   N/A   Labs   Basic Metabolic Panel: Recent Labs  Lab 03/05/21 0039 03/05/21 2133 03/06/21 0051 03/08/21 0041 03/09/21 0057  NA 138 133* 136 137 138  K 3.6 3.6 3.9 3.3* 3.3*  CL 107 104 106 105 107  CO2 25 23 23 25 24   GLUCOSE 98 106* 105* 100* 96  BUN <5* <5* <5* 6 <5*  CREATININE 0.68 0.69 0.77 0.94 0.64  CALCIUM 8.8* 8.5* 8.9 8.6* 8.8*    Liver Function Tests: Recent Labs  Lab 03/05/21 2133  AST 43*  ALT 21  ALKPHOS 43  BILITOT 0.4  PROT 5.7*  ALBUMIN 2.9*   No results for input(s): LIPASE, AMYLASE in the last 168 hours. No results for input(s): AMMONIA in the last 168 hours.  CBC: Recent Labs  Lab 03/05/21 2133 03/06/21 0051 03/07/21 0535 03/08/21 0041 03/09/21 0057  WBC 6.6 6.2 5.3 6.8 5.5  HGB 7.8* 7.9* 8.1* 7.8* 8.0*  HCT 24.1* 24.5* 25.1* 23.9* 25.9*  MCV 94.1 93.9 93.7 93.0 94.2  PLT 215 208  225 234 222    INR: Recent Labs  Lab 03/05/21 2133 03/06/21 0051 03/07/21 0535 03/08/21 0041 03/09/21 0057  INR 1.0 1.0 1.0 1.1 1.1    Other results:    Imaging   No results found.   Medications:     Scheduled Medications:  lactose free nutrition  237 mL Oral TID WC   sildenafil  20 mg Oral TID   Warfarin - Pharmacist Dosing Inpatient   Does not apply q1600   zinc sulfate  220 mg Oral Daily    Infusions:   ceFAZolin (ANCEF) IV 2 g (03/09/21 0624)   heparin 400 Units/hr (03/09/21 0347)    PRN Medications: acetaminophen, morphine injection, ondansetron (ZOFRAN) IV   Patient Profile   36 y/o w/ chronic systolic heart failure, NICM, s/p MH3 LVAD w/ prior history of MSSA DL  infection, being admitted for recurrent DL infection.   Assessment/Plan:    1. Recurrent DL Infection  - previous MSSA DL infection 3/22, treated w/ surgical debridement and IV abx - Now w/ recurrent infection. Wound Cx  + MSSA. BCx NGTD  - CT of C/A/P w/ stranding in the anterior abdominal wall slightly above the umbilicus adjacent to lead extending to the left ventricular assist device, suggestive of possible cellulitis. No loculated fluid collection.  - Abx narrowed cefazolin -- Had wound debridement 1/27 with wound vac placement/ 1/31 VAC dressing change with A Cell application.   Plan for dressing change on Monday.  - ID following, appreciate their assistance  - Discussed with ID - oral linezolid BID for 3 weeks at discharge then switch to chronic cefadroxil afterwards  -ID Team appreciated.    2. Chronic systolic HF due to severe NICM - Echo EF 10% with biventricular failure - HM-3 VAD implant 02/26/19 - Echo 7/21 EF 40-45%--> repeat this admit 40-45%.  - Volume status stable.  - Continue sildenafil - Off coreg and losartan.    3. VAD - Admit 12/21 with multiple low flow alarms. Rising LDH and decreasing PLTs. Suspected outflow graft kink occlusion. Patient placed on his left side and resolved.  - VAD interrogated personally. Parameters stable. - 1/31 Restarted coumadin . INR 1.1 Continue heparin drip until INR 1.8   -- LDH 129> 209>156 >226  4. HTN - Stable.  - Off home Coreg and Losartan  5. Anemia - Admit Hgb 7.0 - Transfused 1 u RBC 1/23 - Hgb 8 today .  - CBC in am   6. Hypokalemia -Supp K  - BMET in am  Planning for dressing change on Monday   I reviewed the LVAD parameters from today, and compared the results to the patient's prior recorded data.  No programming changes were made.  The LVAD is functioning within specified parameters.  The patient performs LVAD self-test daily.  LVAD interrogation was negative for any significant power changes, alarms or PI  events/speed drops.  LVAD equipment check completed and is in good working order.  Back-up equipment present.   LVAD education done on emergency procedures and precautions and reviewed exit site care.  Length of Stay: Marianne, NP 03/09/2021, 10:50 AM  VAD Team --- VAD ISSUES ONLY--- Pager [phone] (7am - 7am)  Advanced Heart Failure Team  Pager [phone] (M-F; 7a - 5p)  Please contact [facility] for night-coverage after hours (5p -7a ) and weekends on [email or web address]   Patient seen and examined with the above-signed Advanced Practice Provider and/or Housestaff. I personally reviewed laboratory data, imaging studies  and relevant notes. I independently examined the patient and formulated the important aspects of the plan. I have edited the note to reflect any of my changes or salient points. I have personally discussed the plan with the patient and/or family.  Remains on IV abx. Afebrile. Wound vac working well. On heparin/warfarin. INR stuck at 1.1. No bleeding. Denies CP or SOB.   General:  NAD.  HEENT: normal  Neck: supple. JVP not elevated.  Carotids 2+ bilat; no bruits. No lymphadenopathy or thryomegaly appreciated. Cor: LVAD hum.  Lungs: Clear. Abdomen: obese soft, nontender, non-distended. No hepatosplenomegaly. No bruits or masses. Good bowel sounds. Wound vac on DL site. Looks good  Anchor in place.  Extremities: no cyanosis, clubbing, rash. Warm no edema  Neuro: alert & oriented x 3. No focal deficits. Moves all 4 without problem   Stable from HF perspective. Continue IV abx and wound vac for DL infection. D/w TCTS plan for wound vac change on Monday. Continue IV abx. D/w ID. Will plan outpatient Zyvox - working with SW to get it paid for.   Continue heparin/warfarin. INR remains low. Discussed dosing with PharmD personally.  VAD interrogated personally. Parameters stable.  Glori Bickers, MD  11:00 AM

## 2021-03-09 NOTE — Progress Notes (Signed)
3 Days Post-Op Procedure(s) (LRB): VAD DRIVELINE WOUND DEBRIDEMENT (N/A) APPLICATION OF WOUND VAC (N/A) APPLICATION OF A-CELL OF CHEST/ABDOMEN Subjective: No complaint Wound vac compressed with min serosanguinous output On iv heparin 400 u and coumadin resumed Objective: Vital signs in last 24 hours: Temp:  [98 F (36.7 C)-98.8 F (37.1 C)] 98.4 F (36.9 C) (02/03 0758) Pulse Rate:  [78-89] 78 (02/03 0758) Cardiac Rhythm: Normal sinus rhythm (02/03 0730) Resp:  [12-20] 17 (02/03 0758) BP: (85-101)/(51-76) 91/73 (02/03 0758) SpO2:  [96 %-100 %] 100 % (02/03 0758) Weight:  [54.6 kg] 54.6 kg (02/03 0524)  Hemodynamic parameters for last 24 hours:    Intake/Output from previous day: 02/02 0701 - 02/03 0700 In: 1354.9 [P.O.:960; I.V.:94.9; IV Piggyback:300] Out: 600 [Urine:600] Intake/Output this shift: No intake/output data recorded.  Lungs clear Normal VAD hum  Lab Results: Recent Labs    03/08/21 0041 03/09/21 0057  WBC 6.8 5.5  HGB 7.8* 8.0*  HCT 23.9* 25.9*  PLT 234 222   BMET:  Recent Labs    03/08/21 0041 03/09/21 0057  NA 137 138  K 3.3* 3.3*  CL 105 107  CO2 25 24  GLUCOSE 100* 96  BUN 6 <5*  CREATININE 0.94 0.64  CALCIUM 8.6* 8.8*    PT/INR:  Recent Labs    03/09/21 0057  LABPROT 14.1  INR 1.1   ABG    Component Value Date/Time   PHART 7.424 03/01/2021 2302   HCO3 22.9 03/01/2021 2302   TCO2 27 03/02/2019 0358   ACIDBASEDEF 1.0 03/01/2021 2302   O2SAT 98.0 03/01/2021 2302   CBG (last 3)  No results for input(s): GLUCAP in the last 72 hours.  Assessment/Plan: S/P Procedure(s) (LRB): VAD DRIVELINE WOUND DEBRIDEMENT (N/A) APPLICATION OF WOUND VAC (N/A) APPLICATION OF A-CELL OF CHEST/ABDOMEN Mobilize Plan return to OR Mon for VAC change with gen anesthesia Procedure d/w patient   LOS: 11 days    Lovett Sox 03/09/2021

## 2021-03-10 DIAGNOSIS — I5022 Chronic systolic (congestive) heart failure: Secondary | ICD-10-CM | POA: Diagnosis not present

## 2021-03-10 DIAGNOSIS — T827XXA Infection and inflammatory reaction due to other cardiac and vascular devices, implants and grafts, initial encounter: Secondary | ICD-10-CM | POA: Diagnosis not present

## 2021-03-10 LAB — BASIC METABOLIC PANEL
Anion gap: 7 (ref 5–15)
BUN: 6 mg/dL (ref 6–20)
CO2: 25 mmol/L (ref 22–32)
Calcium: 9.2 mg/dL (ref 8.9–10.3)
Chloride: 106 mmol/L (ref 98–111)
Creatinine, Ser: 0.75 mg/dL (ref 0.61–1.24)
GFR, Estimated: >60 mL/min (ref >60)
Glucose, Bld: 97 mg/dL (ref 70–99)
Potassium: 4.4 mmol/L (ref 3.5–5.1)
Sodium: 138 mmol/L (ref 135–145)

## 2021-03-10 LAB — MAGNESIUM: Magnesium: 1.8 mg/dL (ref 1.7–2.4)

## 2021-03-10 LAB — CBC
HCT: 23.6 % — ABNORMAL LOW (ref 39.0–52.0)
Hemoglobin: 7.3 g/dL — ABNORMAL LOW (ref 13.0–17.0)
MCH: 29.3 pg (ref 26.0–34.0)
MCHC: 30.9 g/dL (ref 30.0–36.0)
MCV: 94.8 fL (ref 80.0–100.0)
Platelets: 212 10*3/uL (ref 150–400)
RBC: 2.49 MIL/uL — ABNORMAL LOW (ref 4.22–5.81)
RDW: 16.9 % — ABNORMAL HIGH (ref 11.5–15.5)
WBC: 5.5 10*3/uL (ref 4.0–10.5)
nRBC: 0 % (ref 0.0–0.2)

## 2021-03-10 LAB — HEMOGLOBIN AND HEMATOCRIT, BLOOD
HCT: 28.5 % — ABNORMAL LOW (ref 39.0–52.0)
Hemoglobin: 9.2 g/dL — ABNORMAL LOW (ref 13.0–17.0)

## 2021-03-10 LAB — LACTATE DEHYDROGENASE: LDH: 191 U/L (ref 98–192)

## 2021-03-10 LAB — PROTIME-INR
INR: 1.1 (ref 0.8–1.2)
Prothrombin Time: 13.8 seconds (ref 11.4–15.2)

## 2021-03-10 LAB — HEPARIN LEVEL (UNFRACTIONATED): Heparin Unfractionated: <0.1 IU/mL — ABNORMAL LOW (ref 0.30–0.70)

## 2021-03-10 LAB — PREPARE RBC (CROSSMATCH)

## 2021-03-10 MED ORDER — WARFARIN SODIUM 7.5 MG PO TABS
7.5000 mg | ORAL_TABLET | Freq: Once | ORAL | Status: AC
  Administered 2021-03-10: 7.5 mg via ORAL
  Filled 2021-03-10: qty 1

## 2021-03-10 NOTE — Progress Notes (Signed)
Patient ID: Jon Martin, male   DOB: 1985/06/04, 36 y.o.   MRN: [id number]  Advanced Heart Failure VAD Team Note  PCP-Cardiologist: Dr. Haroldine Laws   Subjective:    1/23: Admitted for recurrent DL infection - WCx + staph aureus. BCx NGTD. Abd CT tranding in the anterior abdominal wall slightly above the umbilicus adjacent to lead extending to the left ventricular assist device, suggestive of possible cellulitis. No loculated fluid collection.  1/23: Transfused 1uRBC Hgb 7.0 1/24 Echo EF 40-45%  1/27: Driveline site debridement, wound vac placed. Cultures 1/27 +rare staph aureus  03/06/21: S/P VAD dressing change around driveline with ACell applications. Coumadin was restarted.   Remains cefazolin. ID recommending--> Oral linezolid BID for 3 weeks at discharge then switch to chronic cefadroxil afterwards . SW working on obtaining coverage  INR remains at 1.1. Hgb down 8.0 -> 7.3. no obvious bleeding LDH 129>209 >156>226> 191  Afebrile. Denies SOB, orthopnea or PND. Appetite good.    LVAD INTERROGATION:  HeartMate III LVAD:   Flow 3.9 liters/min, speed 5400 power 4, PI 3.4  VAD interrogated personally. Parameters stable.   Objective:    Vital Signs:   Temp:  [98.1 F (36.7 C)-98.8 F (37.1 C)] 98.2 F (36.8 C) (02/04 0707) Pulse Rate:  [82-100] 94 (02/04 0707) Resp:  [14-20] 18 (02/04 0707) BP: (82-92)/(63-75) 87/64 (02/04 0707) SpO2:  [96 %-100 %] 97 % (02/04 0351) Weight:  [53.8 kg] 53.8 kg (02/04 0351) Last BM Date: 03/09/21 Mean arterial Pressure 70-80s Intake/Output:   Intake/Output Summary (Last 24 hours) at 03/10/2021 0954 Last data filed at 03/10/2021 0733 Gross per 24 hour  Intake 647.8 ml  Output 3025 ml  Net -2377.2 ml      Physical Exam   General:  NAD.  HEENT: normal  Neck: supple. JVP not elevated.  Carotids 2+ bilat; no bruits. No lymphadenopathy or thryomegaly appreciated. Cor: LVAD hum.  Lungs: Clear. Abdomen: obese soft, nontender, non-distended.  No hepatosplenomegaly. No bruits or masses. Good bowel sounds. Driveline site with wound vac in place . Anchor in place.  Extremities: no cyanosis, clubbing, rash. Warm no edema  Neuro: alert & oriented x 3. No focal deficits. Moves all 4 without problem   Telemetry   SR 80-90s Personally reviewed  Labs   Basic Metabolic Panel: Recent Labs  Lab 03/05/21 2133 03/06/21 0051 03/08/21 0041 03/09/21 0057 03/10/21 0058  NA 133* 136 137 138 138  K 3.6 3.9 3.3* 3.3* 4.4  CL 104 106 105 107 106  CO2 23 23 25 24 25   GLUCOSE 106* 105* 100* 96 97  BUN <5* <5* 6 <5* 6  CREATININE 0.69 0.77 0.94 0.64 0.75  CALCIUM 8.5* 8.9 8.6* 8.8* 9.2  MG  --   --   --   --  1.8     Liver Function Tests: Recent Labs  Lab 03/05/21 2133  AST 43*  ALT 21  ALKPHOS 43  BILITOT 0.4  PROT 5.7*  ALBUMIN 2.9*    No results for input(s): LIPASE, AMYLASE in the last 168 hours. No results for input(s): AMMONIA in the last 168 hours.  CBC: Recent Labs  Lab 03/06/21 0051 03/07/21 0535 03/08/21 0041 03/09/21 0057 03/10/21 0058  WBC 6.2 5.3 6.8 5.5 5.5  HGB 7.9* 8.1* 7.8* 8.0* 7.3*  HCT 24.5* 25.1* 23.9* 25.9* 23.6*  MCV 93.9 93.7 93.0 94.2 94.8  PLT 208 225 234 222 212     INR: Recent Labs  Lab 03/06/21 0051 03/07/21 0535  03/08/21 0041 03/09/21 0057 03/10/21 0058  INR 1.0 1.0 1.1 1.1 1.1     Other results:    Imaging   No results found.   Medications:     Scheduled Medications:  lactose free nutrition  237 mL Oral TID WC   potassium chloride  40 mEq Oral Q6H   sildenafil  20 mg Oral TID   warfarin  7.5 mg Oral ONCE-1600   Warfarin - Pharmacist Dosing Inpatient   Does not apply q1600   zinc sulfate  220 mg Oral Daily    Infusions:   ceFAZolin (ANCEF) IV 2 g (03/10/21 0556)   heparin 400 Units/hr (03/09/21 1901)    PRN Medications: acetaminophen, morphine injection, ondansetron (ZOFRAN) IV   Patient Profile   36 y/o w/ chronic systolic heart failure, NICM,  s/p MH3 LVAD w/ prior history of MSSA DL infection, being admitted for recurrent DL infection.   Assessment/Plan:    1. Recurrent DL Infection  - previous MSSA DL infection 3/22, treated w/ surgical debridement and IV abx - Now w/ recurrent infection. Wound Cx  + MSSA. BCx NGTD  - CT of C/A/P w/ stranding in the anterior abdominal wall slightly above the umbilicus adjacent to lead extending to the left ventricular assist device, suggestive of possible cellulitis. No loculated fluid collection.  - Abx narrowed cefazolin -- Had wound debridement 1/27 with wound vac placement/ 1/31 VAC dressing change with A Cell application.   - remains AF. WBC down.  - plan for wound vac change on Monday in OR - ID following, appreciate their assistance  - Discussed with ID - oral linezolid BID for 3 weeks at discharge then switch to chronic cefadroxil afterwards    2. Chronic systolic HF due to severe NICM - Echo EF 10% with biventricular failure - HM-3 VAD implant 02/26/19 - Echo 7/21 EF 40-45%--> repeat this admit 40-45%.  - Volume status stable  - Continue sildenafil - Off coreg and losartan.  MAPs stable 70-80s  3. VAD - Admit 12/21 with multiple low flow alarms. Rising LDH and decreasing PLTs. Suspected outflow graft kink occlusion. Patient placed on his left side and resolved.  - VAD interrogated personally. Parameters stable. - 1/31 Restarted coumadin . I - INR remains at 1.1 Continue heparin drip until INR 1.8  Discussed dosing with PharmD personally. Will give 7.5mg  today -- LDH 129> 209>156 >226 > 191  4. HTN - MAPS table   - Off home Coreg and Losartan  5. Anemia, iron deficient  - Admit Hgb 7.0 - Transfused 1 u RBC 1/23 - Hgb 7.3  today . No obvious bleeding. Will give 1u RBCs   6. Hypokalemia - K 4.4 today  I reviewed the LVAD parameters from today, and compared the results to the patient's prior recorded data.  No programming changes were made.  The LVAD is functioning within  specified parameters.  The patient performs LVAD self-test daily.  LVAD interrogation was negative for any significant power changes, alarms or PI events/speed drops.  LVAD equipment check completed and is in good working order.  Back-up equipment present.   LVAD education done on emergency procedures and precautions and reviewed exit site care.  Length of Stay: 12  Glori Bickers, MD 03/10/2021, 9:54 AM  VAD Team --- VAD ISSUES ONLY--- Pager [phone] (7am - 7am)  Advanced Heart Failure Team  Pager [phone] (M-F; 7a - 5p)  Please contact [facility] for night-coverage after hours (5p -7a ) and weekends on [email or web address]

## 2021-03-10 NOTE — Progress Notes (Addendum)
[place] for heparin>warfarin Indication:  LVAD  No Known Allergies  Patient Measurements: Height: 5\' 6"  (167.6 cm) Weight: 53.8 kg (118 lb 9.7 oz) IBW/kg (Calculated) : 63.8 Heparin Dosing Weight: 52kg  Vital Signs: Temp: 98.2 F (36.8 C) (02/04 0707) Temp Source: Oral (02/04 0707) BP: 87/64 (02/04 0707) Pulse Rate: 94 (02/04 0707)  Labs: Recent Labs    03/08/21 0041 03/09/21 0057 03/10/21 0058  HGB 7.8* 8.0* 7.3*  HCT 23.9* 25.9* 23.6*  PLT 234 222 212  LABPROT 14.0 14.1 13.8  INR 1.1 1.1 1.1  HEPARINUNFRC <0.10* <0.10* <0.10*  CREATININE 0.94 0.64 0.75     Estimated Creatinine Clearance: 98.1 mL/min (by C-G formula based on SCr of 0.75 mg/dL).   Medical History: Past Medical History:  Diagnosis Date   Dilated cardiomyopathy ([place])    Dilated cardiomyopathy ([place]) 02/2019   Polysubstance abuse ([place])     Assessment: 8 yoM with HM3 LVAD on chronic warfarin admitted with DLI.  S/p I&D 1/27, heparin held overnight. Resumed 1/28 am.   Heparin level continuous to be undetectable as expected on low fixed dose heparin at 400 units/hr. CBC is low hemoglobin dropped 8>7.3, but no issues with bleeding per RN. Platelets stable at 212. LDH down to 191 today. Wound vac change 1/31, planning back to OR 2/6.   INR remains low at 1.1.   Prior to admit dose was 2.5 mg (5 mg x 0.5) every Sun, Tue, Thu; 5 mg (5 mg x 1) all other days  Goal of Therapy:  Heparin level <0.3 units/ml Monitor platelets by anticoagulation protocol: Yes   Plan:  Will give warfarin 7.5mg  again today Continue daily INR checks Heparin 400 units/h -fixed rate  Cathrine Muster, PharmD PGY2 Cardiology Pharmacy Resident Phone: [phone] 03/10/2021  7:54 AM  Please check [email or web address] for unit-specific pharmacy phone numbers.

## 2021-03-10 NOTE — Plan of Care (Signed)
  Problem: Education: Goal: Patient will understand all VAD equipment and how it functions Outcome: Progressing Goal: Patient will be able to verbalize current INR target range and antiplatelet therapy for discharge home Outcome: Progressing   Problem: Cardiac: Goal: LVAD will function as expected and patient will experience no clinical alarms Outcome: Progressing   Problem: Education: Goal: Knowledge of General Education information will improve Description: Including pain rating scale, medication(s)/side effects and non-pharmacologic comfort measures Outcome: Progressing   Problem: Health Behavior/Discharge Planning: Goal: Ability to manage health-related needs will improve Outcome: Progressing   Problem: Clinical Measurements: Goal: Ability to maintain clinical measurements within normal limits will improve Outcome: Progressing Goal: Will remain free from infection Outcome: Progressing Goal: Diagnostic test results will improve Outcome: Progressing Goal: Respiratory complications will improve Outcome: Progressing Goal: Cardiovascular complication will be avoided Outcome: Progressing   Problem: Activity: Goal: Risk for activity intolerance will decrease Outcome: Progressing   Problem: Nutrition: Goal: Adequate nutrition will be maintained Outcome: Progressing   Problem: Coping: Goal: Level of anxiety will decrease Outcome: Progressing   Problem: Elimination: Goal: Will not experience complications related to bowel motility Outcome: Progressing Goal: Will not experience complications related to urinary retention Outcome: Progressing   Problem: Pain Managment: Goal: General experience of comfort will improve Outcome: Progressing   Problem: Safety: Goal: Ability to remain free from injury will improve Outcome: Progressing   Problem: Skin Integrity: Goal: Risk for impaired skin integrity will decrease Outcome: Progressing   

## 2021-03-11 DIAGNOSIS — T827XXA Infection and inflammatory reaction due to other cardiac and vascular devices, implants and grafts, initial encounter: Secondary | ICD-10-CM | POA: Diagnosis not present

## 2021-03-11 LAB — BASIC METABOLIC PANEL
Anion gap: 11 (ref 5–15)
BUN: 6 mg/dL (ref 6–20)
CO2: 23 mmol/L (ref 22–32)
Calcium: 9.7 mg/dL (ref 8.9–10.3)
Chloride: 103 mmol/L (ref 98–111)
Creatinine, Ser: 0.67 mg/dL (ref 0.61–1.24)
GFR, Estimated: >60 mL/min (ref >60)
Glucose, Bld: 88 mg/dL (ref 70–99)
Potassium: 4.3 mmol/L (ref 3.5–5.1)
Sodium: 137 mmol/L (ref 135–145)

## 2021-03-11 LAB — PROTIME-INR
INR: 1.1 (ref 0.8–1.2)
Prothrombin Time: 14.1 seconds (ref 11.4–15.2)

## 2021-03-11 LAB — CBC
HCT: 30.6 % — ABNORMAL LOW (ref 39.0–52.0)
Hemoglobin: 9.6 g/dL — ABNORMAL LOW (ref 13.0–17.0)
MCH: 29 pg (ref 26.0–34.0)
MCHC: 31.4 g/dL (ref 30.0–36.0)
MCV: 92.4 fL (ref 80.0–100.0)
Platelets: 245 10*3/uL (ref 150–400)
RBC: 3.31 MIL/uL — ABNORMAL LOW (ref 4.22–5.81)
RDW: 17.7 % — ABNORMAL HIGH (ref 11.5–15.5)
WBC: 6 10*3/uL (ref 4.0–10.5)
nRBC: 0 % (ref 0.0–0.2)

## 2021-03-11 LAB — LACTATE DEHYDROGENASE: LDH: 185 U/L (ref 98–192)

## 2021-03-11 LAB — HEPARIN LEVEL (UNFRACTIONATED): Heparin Unfractionated: <0.1 IU/mL — ABNORMAL LOW (ref 0.30–0.70)

## 2021-03-11 MED ORDER — WARFARIN SODIUM 7.5 MG PO TABS
7.5000 mg | ORAL_TABLET | Freq: Once | ORAL | Status: AC
  Administered 2021-03-11: 7.5 mg via ORAL
  Filled 2021-03-11: qty 1

## 2021-03-11 MED ORDER — CEFAZOLIN SODIUM-DEXTROSE 2-4 GM/100ML-% IV SOLN: 2.0000 g | INTRAVENOUS | Status: DC

## 2021-03-11 NOTE — Progress Notes (Signed)
Patient ID: Jon Martin, male   DOB: [birth year], 36 y.o.   MRN: [id number]   Advanced Heart Failure VAD Team Note  PCP-Cardiologist: Dr. Gala Romney   Subjective:    1/23: Admitted for recurrent DL infection - WCx + staph aureus. BCx NGTD. Abd CT tranding in the anterior abdominal wall slightly above the umbilicus adjacent to lead extending to the left ventricular assist device, suggestive of possible cellulitis. No loculated fluid collection.  1/23: Transfused 1uRBC Hgb 7.0 1/24 Echo EF 40-45%  1/27: Driveline site debridement, wound vac placed. Cultures 1/27 +rare staph aureus  1/31: S/P VAD dressing change around driveline with ACell applications. Coumadin was restarted.   Remains cefazolin. ID recommending--> Oral linezolid BID for 3 weeks at discharge then switch to chronic cefadroxil afterwards . SW working on obtaining coverage  INR remains at 1.1. Hgb up to 9.6 after 1 unit PRBCs on 2/4.  LDH 129>209 >156>226> 191>185  Afebrile. Denies SOB, orthopnea or PND. Appetite good.    LVAD INTERROGATION:  HeartMate III LVAD:   Flow 4.1 liters/min, speed 5400 power 3.9, PI 3.6  VAD interrogated personally. Parameters stable.   Objective:    Vital Signs:   Temp:  [97.9 F (36.6 C)-98.6 F (37 C)] 98.6 F (37 C) (02/05 0727) Pulse Rate:  [73-96] 84 (02/05 0727) Resp:  [12-19] 15 (02/05 0727) BP: (91-99)/(67-83) 92/82 (02/05 0500) SpO2:  [97 %-100 %] 98 % (02/05 0727) Weight:  [53 kg] 53 kg (02/05 0500) Last BM Date: 03/11/21 Mean arterial Pressure 70s-80s Intake/Output:   Intake/Output Summary (Last 24 hours) at 03/11/2021 0915 Last data filed at 03/11/2021 0555 Gross per 24 hour  Intake 1462.89 ml  Output 3150 ml  Net -1687.11 ml     Physical Exam   General: Well appearing this am. NAD.  HEENT: Normal. Neck: Supple, JVP 7-8 cm. Carotids OK.  Cardiac:  Mechanical heart sounds with LVAD hum present.  Lungs:  CTAB, normal effort.  Abdomen:  NT, ND, no HSM. No bruits or  masses. +BS  LVAD exit site: Well-healed and incorporated. Dressing dry and intact. No erythema or drainage. Stabilization device present and accurately applied. Driveline dressing changed daily per sterile technique. Extremities:  Warm and dry. No cyanosis, clubbing, rash, or edema.  Neuro:  Alert & oriented x 3. Cranial nerves grossly intact. Moves all 4 extremities w/o difficulty. Affect pleasant     Telemetry   SR 80-90s Personally reviewed  Labs   Basic Metabolic Panel: Recent Labs  Lab 03/06/21 0051 03/08/21 0041 03/09/21 0057 03/10/21 0058 03/11/21 0140  NA 136 137 138 138 137  K 3.9 3.3* 3.3* 4.4 4.3  CL 106 105 107 106 103  CO2 23 25 24 25 23   GLUCOSE 105* 100* 96 97 88  BUN <5* 6 <5* 6 6  CREATININE 0.77 0.94 0.64 0.75 0.67  CALCIUM 8.9 8.6* 8.8* 9.2 9.7  MG  --   --   --  1.8  --     Liver Function Tests: Recent Labs  Lab 03/05/21 2133  AST 43*  ALT 21  ALKPHOS 43  BILITOT 0.4  PROT 5.7*  ALBUMIN 2.9*   No results for input(s): LIPASE, AMYLASE in the last 168 hours. No results for input(s): AMMONIA in the last 168 hours.  CBC: Recent Labs  Lab 03/07/21 0535 03/08/21 0041 03/09/21 0057 03/10/21 0058 03/10/21 2108 03/11/21 0140  WBC 5.3 6.8 5.5 5.5  --  6.0  HGB 8.1* 7.8* 8.0* 7.3* 9.2* 9.6*  HCT 25.1* 23.9* 25.9* 23.6* 28.5* 30.6*  MCV 93.7 93.0 94.2 94.8  --  92.4  PLT 225 234 222 212  --  245    INR: Recent Labs  Lab 03/07/21 0535 03/08/21 0041 03/09/21 0057 03/10/21 0058 03/11/21 0140  INR 1.0 1.1 1.1 1.1 1.1    Other results:    Imaging   No results found.   Medications:     Scheduled Medications:  lactose free nutrition  237 mL Oral TID WC   potassium chloride  40 mEq Oral Q6H   sildenafil  20 mg Oral TID   warfarin  7.5 mg Oral ONCE-1600   Warfarin - Pharmacist Dosing Inpatient   Does not apply q1600   zinc sulfate  220 mg Oral Daily    Infusions:   ceFAZolin (ANCEF) IV 2 g (03/11/21 0548)   heparin 400  Units/hr (03/10/21 1712)    PRN Medications: acetaminophen, morphine injection, ondansetron (ZOFRAN) IV   Patient Profile   36 y/o w/ chronic systolic heart failure, NICM, s/p MH3 LVAD w/ prior history of MSSA DL infection, being admitted for recurrent DL infection.   Assessment/Plan:    1. Recurrent DL Infection  - previous MSSA DL infection 8/83, treated w/ surgical debridement and IV abx - Now w/ recurrent infection. Wound Cx  + MSSA. BCx NGTD  - CT of C/A/P w/ stranding in the anterior abdominal wall slightly above the umbilicus adjacent to lead extending to the left ventricular assist device, suggestive of possible cellulitis. No loculated fluid collection.  - Abx narrowed cefazolin - Had wound debridement 1/27 with wound vac placement/ 1/31 VAC dressing change with A Cell application.   - remains AF. WBC normal.  - plan for wound vac change on Monday in OR - ID following, appreciate their assistance  - oral linezolid BID for 3 weeks at discharge then switch to chronic cefadroxil afterwards    2. Chronic systolic HF due to severe NICM - Echo EF 10% with biventricular failure - HM-3 VAD implant 02/26/19 - Echo 7/21 EF 40-45%--> repeat this admit 40-45%.  - Volume status stable  - Continue sildenafil - Off coreg and losartan.  MAPs stable 70s-80s  3. VAD - Admit 12/21 with multiple low flow alarms. Rising LDH and decreasing PLTs. Suspected outflow graft kink occlusion. Patient placed on his left side and resolved.  - VAD interrogated personally. Parameters stable. - 1/31 Restarted coumadin . I - INR remains at 1.1 Continue heparin drip until INR 1.8  Discussed dosing with PharmD personally. Will give 7.5mg  today -- LDH 129> 209>156 >226 > 191 > 185  4. HTN - MAPS table   - Off home Coreg and Losartan  5. Anemia, iron deficient  - Admit Hgb 7.0 - Transfused 1 u RBC 1/23 and 1 u 2/4.  - Hgb 9.6 today with no overt bleeding.    6. Hypokalemia - K 4.3 today  I  reviewed the LVAD parameters from today, and compared the results to the patient's prior recorded data.  No programming changes were made.  The LVAD is functioning within specified parameters.  The patient performs LVAD self-test daily.  LVAD interrogation was negative for any significant power changes, alarms or PI events/speed drops.  LVAD equipment check completed and is in good working order.  Back-up equipment present.   LVAD education done on emergency procedures and precautions and reviewed exit site care.  Length of Stay: 37  Marca Ancona, MD 03/11/2021, 9:15 AM  VAD Team ---  VAD ISSUES ONLY--- Pager [phone] (7am - 7am)  Advanced Heart Failure Team  Pager [phone] (M-F; 7a - 5p)  Please contact [facility] for night-coverage after hours (5p -7a ) and weekends on [email or web address]

## 2021-03-11 NOTE — Progress Notes (Addendum)
[place] for heparin>warfarin Indication:  LVAD  No Known Allergies  Patient Measurements: Height: 5\' 6"  (167.6 cm) Weight: 53 kg (116 lb 13.5 oz) IBW/kg (Calculated) : 63.8 Heparin Dosing Weight: 52kg  Vital Signs: Temp: 98.6 F (37 C) (02/05 0727) Temp Source: Oral (02/05 0727) BP: 92/82 (02/05 0500) Pulse Rate: 84 (02/05 0727)  Labs: Recent Labs    03/09/21 0057 03/10/21 0058 03/10/21 2108 03/11/21 0140  HGB 8.0* 7.3* 9.2* 9.6*  HCT 25.9* 23.6* 28.5* 30.6*  PLT 222 212  --  245  LABPROT 14.1 13.8  --  14.1  INR 1.1 1.1  --  1.1  HEPARINUNFRC <0.10* <0.10*  --  <0.10*  CREATININE 0.64 0.75  --  0.67     Estimated Creatinine Clearance: 96.6 mL/min (by C-G formula based on SCr of 0.67 mg/dL).   Medical History: Past Medical History:  Diagnosis Date   Dilated cardiomyopathy ([place])    Dilated cardiomyopathy ([place]) 02/2019   Polysubstance abuse ([place])     Assessment: 70 yoM with HM3 LVAD on chronic warfarin admitted with DLI.  S/p I&D 1/27, heparin held overnight. Resumed 1/28 am.   Heparin level continues to be undetectable as expected on low fixed dose heparin at 400 units/hr. Hgb improved from 7.3 to 9.6 no issues with bleeding per RN. Platelets stable at 185. LDH stable at 185. Wound vac change 1/31, planning to go back to OR 2/6.   INR remains low today at 1.1 after 3 days of 7.5 mg. Will give 7.5 mg warfarin tonight given plan for OR tomorrow. D/w DR. McLean.  Prior to admit dose was 2.5 mg (5 mg x 0.5) every Sun, Tue, Thu; 5 mg (5 mg x 1) all other days  Goal of Therapy:  Heparin level <0.3 units/ml Monitor platelets by anticoagulation protocol: Yes   Plan:  Will give warfarin 7.5 mg today Continue daily INR checks Heparin 400 units/h -fixed rate  Cathrine Muster, PharmD PGY2 Cardiology Pharmacy Resident Phone: [phone] 03/11/2021  8:27 AM  Please check [email or web address] for unit-specific pharmacy phone  numbers.

## 2021-03-12 ENCOUNTER — Other Ambulatory Visit (HOSPITAL_COMMUNITY): Payer: Self-pay

## 2021-03-12 ENCOUNTER — Encounter (HOSPITAL_COMMUNITY)
Admission: AD | Disposition: A | Discharge status: Home / Self Care | Payer: Self-pay | Source: Ambulatory Visit | Attending: Internal Medicine

## 2021-03-12 ENCOUNTER — Encounter (HOSPITAL_COMMUNITY): Payer: Self-pay | Admitting: Internal Medicine

## 2021-03-12 ENCOUNTER — Inpatient Hospital Stay (HOSPITAL_COMMUNITY): Payer: Medicaid Other | Admitting: Certified Registered Nurse Anesthetist

## 2021-03-12 ENCOUNTER — Other Ambulatory Visit: Payer: Self-pay

## 2021-03-12 DIAGNOSIS — I5022 Chronic systolic (congestive) heart failure: Secondary | ICD-10-CM | POA: Diagnosis not present

## 2021-03-12 DIAGNOSIS — T827XXA Infection and inflammatory reaction due to other cardiac and vascular devices, implants and grafts, initial encounter: Secondary | ICD-10-CM

## 2021-03-12 HISTORY — PX: APPLICATION OF WOUND VAC: SHX5189

## 2021-03-12 HISTORY — PX: STERNAL WOUND DEBRIDEMENT: SHX1058

## 2021-03-12 LAB — BASIC METABOLIC PANEL
Anion gap: 10 (ref 5–15)
BUN: 7 mg/dL (ref 6–20)
CO2: 24 mmol/L (ref 22–32)
Calcium: 9.8 mg/dL (ref 8.9–10.3)
Chloride: 103 mmol/L (ref 98–111)
Creatinine, Ser: 0.81 mg/dL (ref 0.61–1.24)
GFR, Estimated: >60 mL/min (ref >60)
Glucose, Bld: 102 mg/dL — ABNORMAL HIGH (ref 70–99)
Potassium: 4.9 mmol/L (ref 3.5–5.1)
Sodium: 137 mmol/L (ref 135–145)

## 2021-03-12 LAB — CBC
HCT: 30.8 % — ABNORMAL LOW (ref 39.0–52.0)
Hemoglobin: 9.8 g/dL — ABNORMAL LOW (ref 13.0–17.0)
MCH: 29.2 pg (ref 26.0–34.0)
MCHC: 31.8 g/dL (ref 30.0–36.0)
MCV: 91.7 fL (ref 80.0–100.0)
Platelets: 243 10*3/uL (ref 150–400)
RBC: 3.36 MIL/uL — ABNORMAL LOW (ref 4.22–5.81)
RDW: 17.6 % — ABNORMAL HIGH (ref 11.5–15.5)
WBC: 6.8 10*3/uL (ref 4.0–10.5)
nRBC: 0 % (ref 0.0–0.2)

## 2021-03-12 LAB — PROTIME-INR
INR: 1.1 (ref 0.8–1.2)
Prothrombin Time: 14.5 seconds (ref 11.4–15.2)

## 2021-03-12 LAB — APTT: aPTT: 32 seconds (ref 24–36)

## 2021-03-12 LAB — HEPARIN LEVEL (UNFRACTIONATED): Heparin Unfractionated: <0.1 IU/mL — ABNORMAL LOW (ref 0.30–0.70)

## 2021-03-12 LAB — LACTATE DEHYDROGENASE: LDH: 163 U/L (ref 98–192)

## 2021-03-12 SURGERY — DEBRIDEMENT, WOUND, STERNUM
Anesthesia: General

## 2021-03-12 MED ORDER — ORAL CARE MOUTH RINSE: 15.0000 mL | Freq: Once | OROMUCOSAL | Status: AC

## 2021-03-12 MED ORDER — ONDANSETRON HCL 4 MG/2ML IJ SOLN
INTRAMUSCULAR | Status: AC
  Filled 2021-03-12: qty 2

## 2021-03-12 MED ORDER — MIDAZOLAM HCL 2 MG/2ML IJ SOLN
INTRAMUSCULAR | Status: DC | PRN
  Administered 2021-03-12 (×2): 1 mg via INTRAVENOUS

## 2021-03-12 MED ORDER — CHLORHEXIDINE GLUCONATE 0.12 % MT SOLN: 15.0000 mL | Freq: Once | OROMUCOSAL | Status: AC

## 2021-03-12 MED ORDER — VANCOMYCIN HCL 1000 MG IV SOLR
INTRAVENOUS | Status: AC
  Filled 2021-03-12: qty 20

## 2021-03-12 MED ORDER — PHENYLEPHRINE 40 MCG/ML (10ML) SYRINGE FOR IV PUSH (FOR BLOOD PRESSURE SUPPORT)
PREFILLED_SYRINGE | INTRAVENOUS | Status: AC
  Filled 2021-03-12: qty 10

## 2021-03-12 MED ORDER — CHLORHEXIDINE GLUCONATE 0.12 % MT SOLN
OROMUCOSAL | Status: AC
  Administered 2021-03-12: 15 mL via OROMUCOSAL
  Filled 2021-03-12: qty 15

## 2021-03-12 MED ORDER — DEXAMETHASONE SODIUM PHOSPHATE 10 MG/ML IJ SOLN
INTRAMUSCULAR | Status: AC
  Filled 2021-03-12: qty 1

## 2021-03-12 MED ORDER — MIDAZOLAM HCL 2 MG/2ML IJ SOLN
INTRAMUSCULAR | Status: AC
  Filled 2021-03-12: qty 2

## 2021-03-12 MED ORDER — PHENYLEPHRINE 40 MCG/ML (10ML) SYRINGE FOR IV PUSH (FOR BLOOD PRESSURE SUPPORT)
PREFILLED_SYRINGE | INTRAVENOUS | Status: DC | PRN
Start: 2021-03-12 — End: 2021-03-12
  Administered 2021-03-12: 80 ug via INTRAVENOUS

## 2021-03-12 MED ORDER — ONDANSETRON HCL 4 MG/2ML IJ SOLN
INTRAMUSCULAR | Status: DC | PRN
  Administered 2021-03-12: 4 mg via INTRAVENOUS

## 2021-03-12 MED ORDER — ALBUMIN HUMAN 5 % IV SOLN
INTRAVENOUS | Status: DC | PRN
Start: 2021-03-12 — End: 2021-03-12

## 2021-03-12 MED ORDER — PHENYLEPHRINE HCL-NACL 20-0.9 MG/250ML-% IV SOLN
INTRAVENOUS | Status: DC | PRN
  Administered 2021-03-12: 25 ug/min via INTRAVENOUS

## 2021-03-12 MED ORDER — VANCOMYCIN HCL 1000 MG IV SOLR
INTRAVENOUS | Status: DC | PRN
  Administered 2021-03-12: 1000 mg

## 2021-03-12 MED ORDER — WARFARIN SODIUM 10 MG PO TABS
10.0000 mg | ORAL_TABLET | Freq: Once | ORAL | Status: AC
  Administered 2021-03-12: 10 mg via ORAL
  Filled 2021-03-12: qty 1

## 2021-03-12 MED ORDER — HEPARIN (PORCINE) 25000 UT/250ML-% IV SOLN
400.0000 [IU]/h | INTRAVENOUS | Status: DC
  Administered 2021-03-12: 400 [IU]/h via INTRAVENOUS
  Filled 2021-03-12 (×2): qty 250

## 2021-03-12 MED ORDER — LIDOCAINE 2% (20 MG/ML) 5 ML SYRINGE
INTRAMUSCULAR | Status: DC | PRN
  Administered 2021-03-12: 60 mg via INTRAVENOUS

## 2021-03-12 MED ORDER — LACTATED RINGERS IV SOLN: INTRAVENOUS | Status: DC

## 2021-03-12 MED ORDER — ORAL CARE MOUTH RINSE
15.0000 mL | Freq: Once | OROMUCOSAL | Status: AC
  Administered 2021-03-12: 15 mL via OROMUCOSAL

## 2021-03-12 MED ORDER — SODIUM CHLORIDE 0.9 % IR SOLN
Status: DC | PRN
  Administered 2021-03-12: 1000 mL

## 2021-03-12 MED ORDER — FENTANYL CITRATE (PF) 100 MCG/2ML IJ SOLN
INTRAMUSCULAR | Status: DC | PRN
  Administered 2021-03-12 (×2): 50 ug via INTRAVENOUS

## 2021-03-12 MED ORDER — PROPOFOL 10 MG/ML IV BOLUS
INTRAVENOUS | Status: AC
  Filled 2021-03-12: qty 20

## 2021-03-12 MED ORDER — PROPOFOL 10 MG/ML IV BOLUS
INTRAVENOUS | Status: DC | PRN
  Administered 2021-03-12 (×2): 50 mg via INTRAVENOUS

## 2021-03-12 MED ORDER — DEXAMETHASONE SODIUM PHOSPHATE 10 MG/ML IJ SOLN
INTRAMUSCULAR | Status: DC | PRN
  Administered 2021-03-12: 4 mg via INTRAVENOUS

## 2021-03-12 MED ORDER — FENTANYL CITRATE (PF) 250 MCG/5ML IJ SOLN
INTRAMUSCULAR | Status: AC
  Filled 2021-03-12: qty 5

## 2021-03-12 MED ORDER — GLYCOPYRROLATE 0.2 MG/ML IJ SOLN
INTRAMUSCULAR | Status: DC | PRN
  Administered 2021-03-12: .2 mg via INTRAVENOUS

## 2021-03-12 SURGICAL SUPPLY — 71 items
ATTRACTOMAT 16X20 MAGNETIC DRP (DRAPES) ×1 IMPLANT
BAG DECANTER FOR FLEXI CONT (MISCELLANEOUS) ×2 IMPLANT
BENZOIN TINCTURE PRP APPL 2/3 (GAUZE/BANDAGES/DRESSINGS) ×1 IMPLANT
BLADE CLIPPER SURG (BLADE) ×1 IMPLANT
BLADE SURG 10 STRL SS (BLADE) ×2 IMPLANT
BLADE SURG 15 STRL LF DISP TIS (BLADE) IMPLANT
BLADE SURG 15 STRL SS (BLADE)
BNDG GAUZE ELAST 4 BULKY (GAUZE/BANDAGES/DRESSINGS) IMPLANT
CANISTER SUCT 3000ML PPV (MISCELLANEOUS) ×2 IMPLANT
CANISTER WOUND CARE 500ML ATS (WOUND CARE) ×2 IMPLANT
CATH FOLEY 2WAY SLVR  5CC 16FR (CATHETERS)
CATH FOLEY 2WAY SLVR 5CC 16FR (CATHETERS) IMPLANT
CATH THORACIC 28FR RT ANG (CATHETERS) IMPLANT
CATH THORACIC 36FR (CATHETERS) IMPLANT
CLIP VESOCCLUDE SM WIDE 24/CT (CLIP) IMPLANT
CNTNR URN SCR LID CUP LEK RST (MISCELLANEOUS) IMPLANT
CONN Y 3/8X3/8X3/8  BEN (MISCELLANEOUS)
CONN Y 3/8X3/8X3/8 BEN (MISCELLANEOUS) IMPLANT
CONT SPEC 4OZ STRL OR WHT (MISCELLANEOUS)
CONTAINER PROTECT SURGISLUSH (MISCELLANEOUS) ×2 IMPLANT
COVER SURGICAL LIGHT HANDLE (MISCELLANEOUS) ×3 IMPLANT
DRAPE LAPAROSCOPIC ABDOMINAL (DRAPES) ×2 IMPLANT
DRAPE SLUSH/WARMER DISC (DRAPES) IMPLANT
DRAPE WARM FLUID 44X44 (DRAPES) ×1 IMPLANT
DRSG AQUACEL AG ADV 3.5X14 (GAUZE/BANDAGES/DRESSINGS) ×1 IMPLANT
DRSG CUTIMED SORBACT 7X9 (GAUZE/BANDAGES/DRESSINGS) ×1 IMPLANT
DRSG PAD ABDOMINAL 8X10 ST (GAUZE/BANDAGES/DRESSINGS) IMPLANT
DRSG VAC ATS LRG SENSATRAC (GAUZE/BANDAGES/DRESSINGS) ×2 IMPLANT
DRSG VAC ATS MED SENSATRAC (GAUZE/BANDAGES/DRESSINGS) ×2 IMPLANT
DRSG VAC ATS SM SENSATRAC (GAUZE/BANDAGES/DRESSINGS) ×3 IMPLANT
ELECT REM PT RETURN 9FT ADLT (ELECTROSURGICAL) ×2
ELECTRODE REM PT RTRN 9FT ADLT (ELECTROSURGICAL) ×1 IMPLANT
GAUZE 4X4 16PLY ~~LOC~~+RFID DBL (SPONGE) ×1 IMPLANT
GAUZE SPONGE 4X4 12PLY STRL (GAUZE/BANDAGES/DRESSINGS) ×1 IMPLANT
GAUZE XEROFORM 5X9 LF (GAUZE/BANDAGES/DRESSINGS) IMPLANT
GLOVE SURG ENC MOIS LTX SZ7.5 (GLOVE) ×3 IMPLANT
GOWN STRL REUS W/ TWL LRG LVL3 (GOWN DISPOSABLE) ×4 IMPLANT
GOWN STRL REUS W/TWL LRG LVL3 (GOWN DISPOSABLE) ×2
HANDPIECE INTERPULSE COAX TIP (DISPOSABLE)
HEMOSTAT POWDER SURGIFOAM 1G (HEMOSTASIS) IMPLANT
HEMOSTAT SURGICEL 2X14 (HEMOSTASIS) IMPLANT
KIT BASIN OR (CUSTOM PROCEDURE TRAY) ×2 IMPLANT
KIT SUCTION CATH 14FR (SUCTIONS) IMPLANT
KIT TURNOVER KIT B (KITS) ×2 IMPLANT
MICROMATRIX 500MG (Tissue) ×2 IMPLANT
NS IRRIG 1000ML POUR BTL (IV SOLUTION) ×2 IMPLANT
PACK CHEST (CUSTOM PROCEDURE TRAY) ×1 IMPLANT
PACK GENERAL/GYN (CUSTOM PROCEDURE TRAY) ×2 IMPLANT
PAD ARMBOARD 7.5X6 YLW CONV (MISCELLANEOUS) ×4 IMPLANT
SET HNDPC FAN SPRY TIP SCT (DISPOSABLE) ×1 IMPLANT
SOL PREP POV-IOD 4OZ 10% (MISCELLANEOUS) ×1 IMPLANT
SOLUTION PARTIC MCRMTRX 500MG (Tissue) IMPLANT
SPONGE T-LAP 18X18 ~~LOC~~+RFID (SPONGE) ×7 IMPLANT
SPONGE T-LAP 4X18 ~~LOC~~+RFID (SPONGE) ×2 IMPLANT
STAPLER VISISTAT 35W (STAPLE) IMPLANT
SUT ETHILON 3 0 FSL (SUTURE) IMPLANT
SUT STEEL 6MS V (SUTURE) IMPLANT
SUT STEEL STERNAL CCS#1 18IN (SUTURE) IMPLANT
SUT STEEL SZ 6 DBL 3X14 BALL (SUTURE) IMPLANT
SUT VIC AB 1 CTX 36 (SUTURE)
SUT VIC AB 1 CTX36XBRD ANBCTR (SUTURE) ×2 IMPLANT
SUT VIC AB 2-0 CTX 27 (SUTURE) ×2 IMPLANT
SUT VIC AB 3-0 SH 18 (SUTURE) ×1 IMPLANT
SUT VIC AB 3-0 X1 27 (SUTURE) ×2 IMPLANT
SWAB COLLECTION DEVICE MRSA (MISCELLANEOUS) IMPLANT
SWAB CULTURE ESWAB REG 1ML (MISCELLANEOUS) IMPLANT
SYR 5ML LL (SYRINGE) IMPLANT
TOWEL GREEN STERILE (TOWEL DISPOSABLE) ×2 IMPLANT
TOWEL GREEN STERILE FF (TOWEL DISPOSABLE) ×2 IMPLANT
TRAY FOLEY MTR SLVR 16FR STAT (SET/KITS/TRAYS/PACK) IMPLANT
WATER STERILE IRR 1000ML POUR (IV SOLUTION) ×2 IMPLANT

## 2021-03-12 NOTE — Anesthesia Postprocedure Evaluation (Signed)
Anesthesia Post Note  Patient: Jon Martin  Procedure(s) Performed: VAD DRIVELINE WOUND DEBRIDEMENT WOUND VAC CHANGE     Patient location during evaluation: PACU Anesthesia Type: General Level of consciousness: awake and alert Pain management: pain level controlled Vital Signs Assessment: post-procedure vital signs reviewed and stable Respiratory status: spontaneous breathing, nonlabored ventilation, respiratory function stable and patient connected to nasal cannula oxygen Cardiovascular status: blood pressure returned to baseline and stable Postop Assessment: no apparent nausea or vomiting Anesthetic complications: no   No notable events documented.  Last Vitals:  Vitals:   03/12/21 1150 03/12/21 1205  BP: (!) 112/94 105/84  Pulse: 100 93  Resp: 13 14  Temp:  37 C  SpO2: 100% 98%    Last Pain:  Vitals:   03/12/21 1205  TempSrc:   PainSc: 0-No pain                 Earl Lites P [place]

## 2021-03-12 NOTE — Anesthesia Procedure Notes (Signed)
Procedure Name: LMA Insertion Date/Time: 03/12/2021 10:24 AM Performed by: Epifanio Lesches, CRNA Pre-anesthesia Checklist: Patient identified, Emergency Drugs available, Suction available and Patient being monitored Patient Re-evaluated:Patient Re-evaluated prior to induction Oxygen Delivery Method: Circle System Utilized Preoxygenation: Pre-oxygenation with 100% oxygen Induction Type: IV induction Ventilation: Mask ventilation without difficulty LMA: LMA inserted LMA Size: 4.0 Number of attempts: 1 Placement Confirmation: positive ETCO2 Tube secured with: Tape Dental Injury: Teeth and Oropharynx as per pre-operative assessment

## 2021-03-12 NOTE — Progress Notes (Signed)
Transported to short stay by bed awake and alert. 

## 2021-03-12 NOTE — Progress Notes (Signed)
VAD Coordinator Procedure Note:   VAD Coordinator met patient in OR. Pt undergoing wound vac change per Dr. Prescott Gum. Hemodynamics and VAD parameters monitored by myself and anesthesia throughout the procedure. Blood pressures were obtained with automatic cuff on right arm. BP supported with Neo drip and Albumin.     Time: Doppler Auto  BP Flow PI Power Speed  Pre-procedure:  0915  87/69 (74) 4.0 4.2 4.0 5400   1010  93/82 (87) 4.1 3.9 3.8 5400           Sedation Induction: 1024  77/60 (67) 4.4 2.3 3.9 5400   1030  110/95 (102) 4.3 1.9 3.8 5400   1045  97/88 (94) 3.7 2.6 3.7 5400   1100  98/86 (92) 3.2 8.5 3.8 5400   1115  109/83 (94) 3.4 9.0 3.9 5400   1130  109/79 (90) 3.5 7.9 4.0 5400           Recovery Area: 1135  115/98 (104) 3.7 6.8 4.1 5400   1145  112/94 (101) 3.5 7.6 4.2 5400   1200  105/84 (92) 3.7 6.1 4.0 5400             Patient Disposition: Patient tolerated the procedure well. VAD Coordinator accompanied and remained with patient in recovery area. Plan to restart at 4 pm at 400 units/hr per Dr Prescott Gum.   Emerson Monte RN [place] Coordinator  Office: [phone]  24/7 Pager: [phone]

## 2021-03-12 NOTE — Progress Notes (Addendum)
Patient ID: Jon Martin, male   DOB: 07-25-1985, 36 y.o.   MRN: [id number]   Advanced Heart Failure VAD Team Note  PCP-Cardiologist: Dr. Haroldine Laws   Subjective:    1/23: Admitted for recurrent DL infection - WCx + staph aureus. BCx NGTD. Abd CT tranding in the anterior abdominal wall slightly above the umbilicus adjacent to lead extending to the left ventricular assist device, suggestive of possible cellulitis. No loculated fluid collection.  1/23: Transfused 1uRBC Hgb 7.0 1/24 Echo EF 40-45%  1/27: Driveline site debridement, wound vac placed. Cultures 1/27 +rare staph aureus  1/31: S/P VAD dressing change around driveline with ACell applications. Coumadin was restarted.   Remains cefazolin. ID recommending--> Oral linezolid BID for 3 weeks at discharge then switch to chronic cefadroxil afterwards . SW working on obtaining coverage  INR remains low at 1.1.   LDH stable.   Hgb stable at 9.8.  No complaints this am. Has been eating okay. Denies dyspnea or CP.   AF.    LVAD INTERROGATION:  HeartMate III LVAD:   Flow 3.9 liters/min, speed 5400 power 4.0, PI 3.8  16 PI events. VAD interrogated personally.    Objective:    Vital Signs:   Temp:  [98.2 F (36.8 C)-99.2 F (37.3 C)] 98.4 F (36.9 C) (02/06 0729) Pulse Rate:  [79-92] 85 (02/06 0729) Resp:  [13-20] 14 (02/06 0729) BP: (81-96)/(51-83) 90/61 (02/06 0729) SpO2:  [97 %-98 %] 97 % (02/06 0729) Weight:  [52.8 kg] 52.8 kg (02/06 0637) Last BM Date: 03/11/21 Mean arterial Pressure 70s-80s Intake/Output:   Intake/Output Summary (Last 24 hours) at 03/12/2021 0829 Last data filed at 03/12/2021 0800 Gross per 24 hour  Intake 1109.88 ml  Output 1300 ml  Net -190.12 ml     Physical Exam   Physical Exam: GENERAL: Well appearing, male who presents to clinic today in no acute distress. HEENT: normal  NECK: Supple, JVP  .  2+ bilaterally, no bruits.  No lymphadenopathy or thyromegaly appreciated.   CARDIAC:  Mechanical  heart sounds with LVAD hum present.  LUNGS:  Clear to auscultation bilaterally.  ABDOMEN:  Soft, round, nontender, positive bowel sounds x4.     LVAD exit site: well-healed and incorporated.  Dressing dry and intact.  No erythema or drainage.  Stabilization device present and accurately applied.  Driveline dressing is being changed daily per sterile technique. EXTREMITIES:  Warm and dry, no cyanosis, clubbing, rash or edema  NEUROLOGIC:  Alert and oriented x 4.  Gait steady.  No aphasia.  No dysarthria.  Affect pleasant.       Telemetry   SR 90s, ~ 5 PVCs/min, 5 beat run NSVT  Labs   Basic Metabolic Panel: Recent Labs  Lab 03/08/21 0041 03/09/21 0057 03/10/21 0058 03/11/21 0140 03/12/21 0027  NA 137 138 138 137 137  K 3.3* 3.3* 4.4 4.3 4.9  CL 105 107 106 103 103  CO2 25 24 25 23 24   GLUCOSE 100* 96 97 88 102*  BUN 6 <5* 6 6 7   CREATININE 0.94 0.64 0.75 0.67 0.81  CALCIUM 8.6* 8.8* 9.2 9.7 9.8  MG  --   --  1.8  --   --     Liver Function Tests: Recent Labs  Lab 03/05/21 2133  AST 43*  ALT 21  ALKPHOS 43  BILITOT 0.4  PROT 5.7*  ALBUMIN 2.9*   No results for input(s): LIPASE, AMYLASE in the last 168 hours. No results for input(s): AMMONIA in the last  168 hours.  CBC: Recent Labs  Lab 03/08/21 0041 03/09/21 0057 03/10/21 0058 03/10/21 2108 03/11/21 0140 03/12/21 0027  WBC 6.8 5.5 5.5  --  6.0 6.8  HGB 7.8* 8.0* 7.3* 9.2* 9.6* 9.8*  HCT 23.9* 25.9* 23.6* 28.5* 30.6* 30.8*  MCV 93.0 94.2 94.8  --  92.4 91.7  PLT 234 222 212  --  245 243    INR: Recent Labs  Lab 03/08/21 0041 03/09/21 0057 03/10/21 0058 03/11/21 0140 03/12/21 0027  INR 1.1 1.1 1.1 1.1 1.1    Other results:    Imaging   No results found.   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   Assessment/Plan:    1. Recurrent DL Infection  - previous MSSA DL infection 3/22, treated w/ surgical debridement and IV abx - Now w/ recurrent infection. Wound Cx  + MSSA. BCx NGTD  - CT of C/A/P w/ stranding in the anterior abdominal wall slightly above the umbilicus adjacent to lead extending to the left ventricular assist device, suggestive of possible cellulitis. No loculated fluid collection.  - Abx narrowed cefazolin - Had wound debridement 1/27 with wound vac placement/ 1/31 VAC dressing change with A Cell application.   - remains AF. WBC normal.  - plan for wound vac change on Monday in OR - ID following, appreciate their assistance  - oral linezolid BID for 3 weeks at discharge then switch to chronic cefadroxil afterwards    2. Chronic systolic HF due to severe NICM - Echo EF 10% with biventricular failure - HM-3 VAD implant 02/26/19 - Echo 7/21 EF 40-45%--> repeat this admit 40-45%.  - Volume status stable  - Continue sildenafil - Off coreg and losartan.  MAPs stable 70s-80s  3. VAD - Admit 12/21 with multiple low flow alarms. Rising LDH and decreasing PLTs. Suspected outflow graft kink occlusion. Patient placed on his left side and resolved.  - VAD interrogated personally. Parameters stable. - 1/31 Restarted coumadin . I - INR remains at 1.1 Continue heparin drip until INR 1.8  Discussed dosing with PharmD personally. Will give 7.5mg  today -- LDH 129> 209>156 >226 > 191 > 185  4. HTN - MAPS table   - Off home Coreg and Losartan  5. Anemia, iron deficient  - Admit Hgb 7.0 - Transfused 1 u RBC 1/23 and 1 u 2/4.   - Hgb 9.6 today with no overt bleeding.    6. Hypokalemia - K 4.3 today  I reviewed the LVAD parameters from today, and compared the results to the patient's prior recorded data.  No programming changes were made.  The LVAD is functioning within specified parameters.  The patient performs LVAD self-test daily.  LVAD interrogation was negative for any significant power changes, alarms or PI events/speed drops.  LVAD equipment check completed and is in good working order.  Back-up equipment present.  LVAD education done on emergency procedures and precautions and reviewed exit site care.  Length of Stay: [address], LINDSAY N, PA-C 03/12/2021, 8:29 AM  VAD Team --- VAD ISSUES ONLY--- Pager [phone] (7am - 7am)  Advanced Heart Failure Team  Pager [phone] (M-F; 7a - 5p)  Please contact [facility] for night-coverage after hours (5p -7a ) and weekends on [email or web address]   Patient seen and examined with the above-signed Advanced Practice Provider and/or Housestaff. I personally reviewed laboratory data, imaging studies and relevant notes. I independently examined the patient and formulated the important aspects of the plan. I have edited the note to reflect any of my changes or salient points. I have personally discussed the plan with the patient and/or family.  Feels ok. Remains on IV abx. Afebrile. On IV heparin/warfarin. INR still 1.1. No bleeding. Wound vac working well.   General:  NAD.  HEENT: normal  Neck: supple. JVP not elevated.  Carotids 2+ bilat; no bruits. No lymphadenopathy or thryomegaly appreciated. Cor: LVAD hum.  Lungs: Clear. Abdomen: soft, nontender, non-distended. No hepatosplenomegaly. No bruits or masses. Good bowel sounds. Driveline site with wound vac  Anchor in place.  Extremities: no cyanosis, clubbing, rash. Warm no edema  Neuro: alert & oriented x 3. No focal deficits. Moves all 4 without problem    BAck to OR today for repeat I&D and wound vac change. Continue  IV abx. Continue heparin. Warfarin dosing discussed with PharmD personally. VAD interrogated personally. Parameters stable.   Glori Bickers, MD  9:01 AM

## 2021-03-12 NOTE — Progress Notes (Signed)
Mobility Specialist: Progress Note   03/12/21 1451  Mobility  Activity Ambulated independently in hallway  Level of Assistance Independent  Assistive Device  (IV Pole)  Distance Ambulated (ft) 800 ft  Activity Response Tolerated well  $Mobility charge 1 Mobility   Received pt in bed having no complaints and agreeable to mobility. Asymptomatic throughout ambulation, returned back to bed w/ call bell in reach and all needs met.  [facility] Kyndal Heringer Mobility Specialist Mobility Specialist 5 North: [phone] Mobility Specialist 6 North: [phone]

## 2021-03-12 NOTE — Transfer of Care (Signed)
Immediate Anesthesia Transfer of Care Note  Patient: Jon Martin  Procedure(s) Performed: VAD DRIVELINE WOUND DEBRIDEMENT WOUND VAC CHANGE  Patient Location: PACU  Anesthesia Type:General  Level of Consciousness: awake, alert  and oriented  Airway & Oxygen Therapy: Patient Spontanous Breathing  Post-op Assessment: Report given to RN and Post -op Vital signs reviewed and stable  Post vital signs: Reviewed and stable  Last Vitals:  Vitals Value Taken Time  BP 115/98 03/12/21 1135  Temp    Pulse 109 03/12/21 1142  Resp 19 03/12/21 1142  SpO2 100 % 03/12/21 1142  Vitals shown include unvalidated device data.  Last Pain:  Vitals:   03/12/21 0903  TempSrc:   PainSc: 0-No pain      Patients Stated Pain Goal: 0 (03/06/21 1341)  Complications: No notable events documented.

## 2021-03-12 NOTE — Progress Notes (Signed)
LVAD Coordinator Rounding Note:  Admitted 02/26/21 due to Dr. Clayborne Dana service for VAD drive line infection.   HM III LVAD implanted on 02/26/19 by Dr. Darcey Nora under Destination Therapy criteria.  Met pt in OR holding area. Laying in bed watching TV. Denies complaints. His INR remains subtherapeutic with Heparin gtt. Heparin has been on hold since 0830 this morning for wound vac change.   Discussed how patient will get home when discharged, he has required [facility] Popper in past. He says his niece may be able to pick him up; potential discharge next week when INR therapeutic.   Plan for PO Linezolid (Zyvox) 600 mg BID x 3 weeks at discharge per ID team. Per note in chart from this morning prior authorization has been approved with a $0 co-pay.   Vital signs: Temp: 98.1 HR: 81 Doppler Pressure: 78 Automatic BP:  87/69 (74) O2 Sat: 100% RA Wt: 111.9>115.3>115.5>115>113.5>110.2>118>117.9>120.3>116.4 lbs  LVAD interrogation reveals:  Speed: 5400 Flow: 4.0 Power:  4.0 w PI: 4.2 Hct: 20 - DO NOT CHANGE   Alarms: none Events: 24 PI events so far today  Fixed speed: 5400 Low speed limit: 5100  Drive Line/Abdominal Abcsess:  Wound vac dressing clean, dry, and intact. Minimal serosanguinous drainage in wound vac canister. No leak alarms noted. For VAC change in OR today by Dr Prescott Gum.   Labs:  LDH trend: 154>145>172>163>155>132>209>156>226>163  INR trend: 2.3>1.4>1.1>1.0>1.0>1.0>1.0>1.1>1.1>1.1  Hgb trend: 7.0>8.4>9.3>8.9>8.3>8.2>7.9>8.1>7.8>8.0>9.8  Anticoagulation Plan: -INR Goal: 2.0 - 2.5 -ASA Dose: none  Blood Products:  - 02/26/21>>1 units PCs  Device: N/A  Infection:  02/26/21 Drive line wound culture>> few staph aureus; final  02/26/21 Abdominal abscess culture>> few staph aureus; final  02/26/21 Blood cultures >> no growth 5 days; final   Drips:  Heparin 400 units/hr-- stopped for procedure. Will restart this afternoon at 4 pm per Dr Prescott Gum.   Plan/Recommendations:   1. Call VAD Coordinator if any VAD equipment or drive line issues. 2. Wound VAC changes per Dr. Thayer Ohm instructions. 3. Patient scheduled for wound vac change in OR per Dr. Prescott Gum today. VAD Coordinator will accompany and remain with patient during procedure.   Emerson Monte RN [place] Coordinator  Office: [phone]  24/7 Pager: [phone]

## 2021-03-12 NOTE — Progress Notes (Signed)
Back  from PACU by bed awake and alert. 

## 2021-03-12 NOTE — Anesthesia Preprocedure Evaluation (Signed)
Anesthesia Evaluation  Patient identified by MRN, date of birth, ID band Patient awake    Reviewed: Allergy & Precautions, NPO status , Patient's Chart, lab work & pertinent test results  Airway Mallampati: II  TM Distance: >3 FB Neck ROM: Full    Dental  (+) Dental Advisory Given, Poor Dentition,    Pulmonary neg pulmonary ROS, former smoker,    breath sounds clear to auscultation       Cardiovascular +CHF  Normal cardiovascular exam+ Valvular Problems/Murmurs MR   LVAD in place  Echo 02/27/2021 1. Limited study for LVAD Assessment.   Parameters not entered, study assumes most recent documented LVAD parameters:   Flow 3.5 liters/min, speed 5400, power 3.8, PI 4.6   Inflow cannula visualized without Doppler interrogation.   Outflow cannular visualized without Doppler interrogation. Left ventricular ejection fraction, by estimation, is 40 to 45%. The left ventricle has mildly decreased function. The left ventricle demonstrates  global hypokinesis. Neutral septum.  2. Right ventricular systolic function is mildly reduced with basal function largely preserved. The right ventricular size is severely enlarged.  3. The mitral valve is normal in structure. Mild to moderate mitral valve regurgitation. No evidence of mitral stenosis.  4. The aortic valve is tricuspid. Aortic valve regurgitation is not visualized. Aortic valve opens consistently.    Neuro/Psych negative neurological ROS  negative psych ROS   GI/Hepatic negative GI ROS, Neg liver ROS,   Endo/Other  negative endocrine ROS  Renal/GU Renal disease     Musculoskeletal negative musculoskeletal ROS (+)   Abdominal   Peds  Hematology negative hematology ROS (+) Blood dyscrasia, anemia ,   Anesthesia Other Findings   Reproductive/Obstetrics                             Anesthesia Physical  Anesthesia Plan  ASA:  4  Anesthesia Plan: General   Post-op Pain Management: Minimal or no pain anticipated   Induction: Intravenous  PONV Risk Score and Plan: 3 and Ondansetron, Midazolam, Treatment may vary due to age or medical condition and Dexamethasone  Airway Management Planned: LMA and Mask  Additional Equipment:   Intra-op Plan:   Post-operative Plan: Extubation in OR  Informed Consent: I have reviewed the patients History and Physical, chart, labs and discussed the procedure including the risks, benefits and alternatives for the proposed anesthesia with the patient or authorized representative who has indicated his/her understanding and acceptance.     Dental advisory given  Plan Discussed with: Anesthesiologist, CRNA and Surgeon  Anesthesia Plan Comments: (Lab Results      Component                Value               Date                      WBC                      6.8                 03/12/2021                HGB                      9.8 (L)             03/12/2021  HCT                      30.8 (L)            03/12/2021                MCV                      91.7                03/12/2021                PLT                      243                 03/12/2021           Lab Results      Component                Value               Date                      NA                       137                 03/12/2021                K                        4.9                 03/12/2021                CO2                      24                  03/12/2021                GLUCOSE                  102 (H)             03/12/2021                BUN                      7                   03/12/2021                CREATININE               0.81                03/12/2021                CALCIUM                  9.8                 03/12/2021                GFRNONAA                 >  60                 03/12/2021          )        Anesthesia Quick Evaluation

## 2021-03-12 NOTE — TOC Benefit Eligibility Note (Signed)
Patient Advocate Encounter  Prior Authorization for linezolid (Zyvox) 600 mg tablets has been approved.    PA# [id number] Effective dates: 03/09/2021 through 04/06/2021  Patients co-pay is $0.00.     Roland Earl, CPhT Pharmacy Patient Advocate Specialist [facility] Patient Advocate Team Direct Number: [phone]  Fax: [phone]

## 2021-03-12 NOTE — Brief Op Note (Signed)
03/12/2021  11:34 AM  PATIENT:  Jon Martin  36 y.o. male  PRE-OPERATIVE DIAGNOSIS:  VAD DRIVELINE INFECTION  POST-OPERATIVE DIAGNOSIS:  VAD DRIVELINE INFECTION  PROCEDURE:  Procedure(s): VAD DRIVELINE WOUND DEBRIDEMENT (N/A) WOUND VAC CHANGE (N/A) Wound Irrigation  Findings- excellent bed of granulation tissue, healing nicely  SURGEON:  Surgeon(s) and Role:    [name], MD - Primary  PHYSICIAN ASSISTANT: none  ASSISTANTS: Jamie RN   ANESTHESIA:   general  EBL:  1 mL   BLOOD ADMINISTERED:none  DRAINS: none   LOCAL MEDICATIONS USED:  NONE  SPECIMEN:  No Specimen  DISPOSITION OF SPECIMEN:  N/A  COUNTS:  YES  TOURNIQUET:  * No tourniquets in log *  DICTATION: .Dragon Dictation  PLAN OF CARE:  return to 2C  PATIENT DISPOSITION:  ICU - extubated and stable.   Delay start of Pharmacological VTE agent (>24hrs) due to surgical blood loss or risk of bleeding: yes- resume preop heparin at 4 pm Feb 6

## 2021-03-12 NOTE — Progress Notes (Signed)
Pre Procedure note for inpatients:   Jon Martin has been scheduled for Procedure(s): VAD DRIVELINE WOUND DEBRIDEMENT (N/A) APPLICATION OF WOUND VAC (N/A) APPLICATION OF A-CELL OF CHEST/ABDOMEN today. The various methods of treatment have been discussed with the patient. After consideration of the risks, benefits and treatment options the patient has consented to the planned procedure.   The patient has been seen and labs reviewed. There are no changes in the patients condition to prevent proceeding with the planned procedure today.  Recent labs:  Lab Results  Component Value Date   WBC 6.8 03/12/2021   HGB 9.8 (L) 03/12/2021   HCT 30.8 (L) 03/12/2021   PLT 243 03/12/2021   GLUCOSE 102 (H) 03/12/2021   CHOL 89 02/11/2019   TRIG 58 02/11/2019   HDL 25 (L) 02/11/2019   LDLCALC 52 02/11/2019   ALT 21 03/05/2021   AST 43 (H) 03/05/2021   NA 137 03/12/2021   K 4.9 03/12/2021   CL 103 03/12/2021   CREATININE 0.81 03/12/2021   BUN 7 03/12/2021   CO2 24 03/12/2021   TSH 1.862 01/18/2020   INR 1.1 03/12/2021   HGBA1C 5.2 02/11/2019    Lovett Sox, MD 03/12/2021 7:41 AM

## 2021-03-12 NOTE — Progress Notes (Signed)
ANTICOAGULATION CONSULT NOTE  Pharmacy Consult for heparin>warfarin Indication:  LVAD  No Known Allergies  Patient Measurements: Height: 5\' 6"  (167.6 cm) Weight: 52.8 kg (116 lb 6.5 oz) IBW/kg (Calculated) : 63.8 Heparin Dosing Weight: 52kg  Vital Signs: Temp: 98.6 F (37 C) (02/06 1205) Temp Source: Oral (02/06 0851) BP: 105/84 (02/06 1205) Pulse Rate: 93 (02/06 1205)  Labs: Recent Labs    03/10/21 0058 03/10/21 2108 03/11/21 0140 03/12/21 0027  HGB 7.3* 9.2* 9.6* 9.8*  HCT 23.6* 28.5* 30.6* 30.8*  PLT 212  --  245 243  APTT  --   --   --  32  LABPROT 13.8  --  14.1 14.5  INR 1.1  --  1.1 1.1  HEPARINUNFRC <0.10*  --  <0.10* <0.10*  CREATININE 0.75  --  0.67 0.81     Estimated Creatinine Clearance: 95.1 mL/min (by C-G formula based on SCr of 0.81 mg/dL).   Medical History: Past Medical History:  Diagnosis Date   Dilated cardiomyopathy (HCC)    Dilated cardiomyopathy (HCC) 02/2019   Polysubstance abuse (HCC)     Assessment: 54 yoM with HM3 LVAD on chronic warfarin admitted with DLI. S/p I&D 1/27, heparin held overnight. Resumed 1/28 am.   Heparin level remains undetectable as expected on low fixed dose heparin at 400 units/hr. Hgb stable at 9.8, plt 243. LDH 163. S/p wound VAC change on 2/6 - okay to resume heparin at 1600 tonight.  INR remains low at 1.1 after 4 days of 7.5 mg - should be seeing effect. Oral intake documented at 100% - Boost supplements ordered but minimal impact on INR.   Prior to admit dose was 2.5 mg (5 mg x 0.5) every Sun, Tue, Thu; 5 mg (5 mg x 1) all other days.  Goal of Therapy:  Heparin level <0.3 units/ml Monitor platelets by anticoagulation protocol: Yes   Plan:  Will give warfarin 10 mg today Continue daily INR checks Resume heparin infusion at 400 units/hr on 2/6@1600  (fixed rate, no titrations unless per MD)  04-30-1990, PharmD, BCCCP Clinical Pharmacist  Phone: [phone] 03/12/2021 12:32 PM  Please check AMION  for all [facility] Pharmacy phone numbers After 10:00 PM, call Main Pharmacy [phone]

## 2021-03-12 NOTE — Op Note (Signed)
NAMEDON, TIU MEDICAL RECORD NO: [id number] ACCOUNT NO: [id number] DATE OF BIRTH: 1985/11/22 FACILITY: [facility] LOCATION: [facility] PHYSICIAN: Kerin Perna III, MD  Operative Report   DATE OF PROCEDURE: 03/12/2021  OPERATION: 1.  Wound VAC change and irrigation of abdominal wound/HeartMate 3 power cord tunnel. 2.  Application of ACell powder.  SURGEON:  Kathlee Nations Trigt III, MD.  ANESTHESIA:  General.    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  PROCEDURE:  The patient was brought from preoperative holding where informed consent was document and final issues were addressed with the patient in a face-to-face meeting.  The patient was brought to the operating room and placed supine on the  operating table.  During the entire procedure, the VAD nurse coordinator was present to help monitor the function of the VAD and to assist with monitoring.  The patient was induced for general anesthesia and an LMA airway was used.  The previous wound VAC system was removed.  The wound was inspected.  It was starting to contract.  The Sorbact coating was partially disrupted.  The wound was then prepped and draped as a sterile field.  A proper timeout was performed.  The rest of the sutures were removed to take off the entire Sorbact sheet.  The wound was inspected.  It had excellent granulation tissue.  The previously applied  ACell was being incorporated nicely.  There was good wound healing.  It was starting to contract.  The wound was then gently irrigated with vancomycin irrigation.  Another 400 mg of ACell powder was then placed into the wound and around the power cord, which was intact and had no biofilm.  Next, a ____ Sorbact was cut to the appropriate size and   configuration and tacked to the edges of the wound with 3-0 Vicryl sutures.  Next, over the Sorbact sterile surgical gel was applied to keep the wound moist.  Over this, a small sponge was cut to the appropriate configuration and size, over this the  wound VAC sheaths were placed.  An opening was made in the sheaths for the suction tubing and the system was initiated and it had good compression of the sponge and no air leak.  The patient was then reversed from anesthesia and returned to the recovery  room in stable condition.   CHR D: 03/12/2021 11:55:58 am T: 03/12/2021 8:58:00 pm  JOB: [id number]/ [id number]

## 2021-03-13 ENCOUNTER — Encounter (HOSPITAL_COMMUNITY): Payer: Self-pay | Admitting: Cardiothoracic Surgery

## 2021-03-13 DIAGNOSIS — I5022 Chronic systolic (congestive) heart failure: Secondary | ICD-10-CM | POA: Diagnosis not present

## 2021-03-13 DIAGNOSIS — T827XXA Infection and inflammatory reaction due to other cardiac and vascular devices, implants and grafts, initial encounter: Secondary | ICD-10-CM | POA: Diagnosis not present

## 2021-03-13 LAB — CBC
HCT: 29.2 % — ABNORMAL LOW (ref 39.0–52.0)
Hemoglobin: 9.5 g/dL — ABNORMAL LOW (ref 13.0–17.0)
MCH: 30.1 pg (ref 26.0–34.0)
MCHC: 32.5 g/dL (ref 30.0–36.0)
MCV: 92.4 fL (ref 80.0–100.0)
Platelets: 271 10*3/uL (ref 150–400)
RBC: 3.16 MIL/uL — ABNORMAL LOW (ref 4.22–5.81)
RDW: 17.3 % — ABNORMAL HIGH (ref 11.5–15.5)
WBC: 9.5 10*3/uL (ref 4.0–10.5)
nRBC: 0 % (ref 0.0–0.2)

## 2021-03-13 LAB — BASIC METABOLIC PANEL
Anion gap: 9 (ref 5–15)
BUN: 9 mg/dL (ref 6–20)
CO2: 23 mmol/L (ref 22–32)
Calcium: 9.2 mg/dL (ref 8.9–10.3)
Chloride: 104 mmol/L (ref 98–111)
Creatinine, Ser: 0.74 mg/dL (ref 0.61–1.24)
GFR, Estimated: >60 mL/min (ref >60)
Glucose, Bld: 129 mg/dL — ABNORMAL HIGH (ref 70–99)
Potassium: 4.1 mmol/L (ref 3.5–5.1)
Sodium: 136 mmol/L (ref 135–145)

## 2021-03-13 LAB — PROTIME-INR
INR: 1.3 — ABNORMAL HIGH (ref 0.8–1.2)
Prothrombin Time: 15.7 seconds — ABNORMAL HIGH (ref 11.4–15.2)

## 2021-03-13 LAB — HEPARIN LEVEL (UNFRACTIONATED): Heparin Unfractionated: <0.1 IU/mL — ABNORMAL LOW (ref 0.30–0.70)

## 2021-03-13 LAB — LACTATE DEHYDROGENASE: LDH: 139 U/L (ref 98–192)

## 2021-03-13 MED ORDER — POTASSIUM CHLORIDE CRYS ER 20 MEQ PO TBCR
40.0000 meq | EXTENDED_RELEASE_TABLET | Freq: Two times a day (BID) | ORAL | Status: DC
  Administered 2021-03-14 – 2021-03-16 (×5): 40 meq via ORAL
  Filled 2021-03-13 (×5): qty 2

## 2021-03-13 MED ORDER — WARFARIN SODIUM 10 MG PO TABS
10.0000 mg | ORAL_TABLET | Freq: Once | ORAL | Status: AC
  Administered 2021-03-13: 10 mg via ORAL
  Filled 2021-03-13: qty 1

## 2021-03-13 NOTE — Plan of Care (Signed)
°  Problem: Cardiac: Goal: LVAD will function as expected and patient will experience no clinical alarms Outcome: Progressing   Problem: Education: Goal: Knowledge of General Education information will improve Description: Including pain rating scale, medication(s)/side effects and non-pharmacologic comfort measures Outcome: Progressing   Problem: Nutrition: Goal: Adequate nutrition will be maintained Outcome: Progressing   Problem: Coping: Goal: Level of anxiety will decrease Outcome: Progressing   Problem: Safety: Goal: Ability to remain free from injury will improve Outcome: Progressing

## 2021-03-13 NOTE — Progress Notes (Signed)
ANTICOAGULATION CONSULT NOTE  Pharmacy Consult for heparin>warfarin Indication:  LVAD  No Known Allergies  Patient Measurements: Height: 5\' 6"  (167.6 cm) Weight: 54.1 kg (119 lb 4.3 oz) IBW/kg (Calculated) : 63.8 Heparin Dosing Weight: 52kg  Vital Signs: Temp: 98.6 F (37 C) (02/07 1058) Temp Source: Oral (02/07 1058) BP: 104/60 (02/07 1058) Pulse Rate: 71 (02/07 1058)  Labs: Recent Labs    03/11/21 0140 03/12/21 0027 03/13/21 0557  HGB 9.6* 9.8* 9.5*  HCT 30.6* 30.8* 29.2*  PLT 245 243 271  APTT  --  32  --   LABPROT 14.1 14.5 15.7*  INR 1.1 1.1 1.3*  HEPARINUNFRC <0.10* <0.10* <0.10*  CREATININE 0.67 0.81 0.74     Estimated Creatinine Clearance: 98.6 mL/min (by C-G formula based on SCr of 0.74 mg/dL).   Medical History: Past Medical History:  Diagnosis Date   Dilated cardiomyopathy (HCC)    Dilated cardiomyopathy (HCC) 02/2019   Polysubstance abuse (HCC)     Assessment: 69 yoM with HM3 LVAD on chronic warfarin admitted with DLI. S/p I&D 1/27, heparin held overnight. Resumed 1/28 am.   Heparin level remains undetectable as expected on low fixed dose heparin at 400 units/hr. Hgb stable at 9.5, plt 271. LDH 139. S/p wound VAC change on 2/6 - back on fixed-dose heparin at 400 units/hr.  INR finally starting to increase, up to 1.3 today.  Prior to admit dose was 2.5 mg (5 mg x 0.5) every Sun, Tue, Thu; 5 mg (5 mg x 1) all other days.  Has been requiring doses much higher than home doses while in hospital.  ?poor diet at home  Goal of Therapy:  Heparin level <0.3 units/ml Monitor platelets by anticoagulation protocol: Yes   Plan:  Will give warfarin 10 mg today Continue daily INR checks Continue heparin infusion at 400 units/hr.  04-30-1990, Reece Leader, BCCP Clinical Pharmacist  03/13/2021 1:54 PM   [facility] pharmacy phone numbers are listed on [email or web address]

## 2021-03-13 NOTE — Progress Notes (Addendum)
Patient ID: Jon Martin, male   DOB: 1985/05/15, 36 y.o.   MRN: [id number]   Advanced Heart Failure VAD Team Note  PCP-Cardiologist: Dr. Haroldine Laws   Subjective:    1/23: Admitted for recurrent DL infection - WCx + staph aureus. BCx NGTD. Abd CT tranding in the anterior abdominal wall slightly above the umbilicus adjacent to lead extending to the left ventricular assist device, suggestive of possible cellulitis. No loculated fluid collection.  1/23: Transfused 1uRBC Hgb 7.0 1/24 Echo EF 40-45%  1/27: Driveline site debridement, wound vac placed. Cultures 1/27 +rare staph aureus  1/31: S/P VAD dressing change around driveline with ACell applications. Coumadin was restarted.   S/p wound va change in OR yesterday.    Doing well. Remains on IV abx. No fevers. Wound vac working well. On heparin/warfarin. No bleeding  INR pending  LVAD INTERROGATION:  HeartMate III LVAD:   Flow 4.3 liters/min, speed 5400 power 4.0, PI 3.4  No PI events.    Objective:    Vital Signs:   Temp:  [98.1 F (36.7 C)-98.7 F (37.1 C)] 98.7 F (37.1 C) (02/07 0428) Pulse Rate:  [62-100] 80 (02/07 0428) Resp:  [13-23] 15 (02/07 0428) BP: (78-115)/(49-98) 94/76 (02/07 0432) SpO2:  [97 %-100 %] 98 % (02/07 0428) Weight:  [52.8 kg-54.1 kg] 54.1 kg (02/07 0428) Last BM Date: 03/11/21 Mean arterial Pressure 70-80s Intake/Output:   Intake/Output Summary (Last 24 hours) at 03/13/2021 0631 Last data filed at 03/12/2021 2013 Gross per 24 hour  Intake 966.07 ml  Output 801 ml  Net 165.07 ml      Physical Exam   Physical Exam: General:  NAD.  HEENT: normal  Neck: supple. JVP not elevated.  Carotids 2+ bilat; no bruits. No lymphadenopathy or thryomegaly appreciated. Cor: LVAD hum.  Lungs: Clear. Abdomen: soft, nontender, non-distended. No hepatosplenomegaly. No bruits or masses. Good bowel sounds. Driveline site with wound vac Anchor in place.  Extremities: no cyanosis, clubbing, rash. Warm no edema   Neuro: alert & oriented x 3. No focal deficits. Moves all 4 without problem    Telemetry   SR 80-90s Personally reviewed   Labs   Basic Metabolic Panel: Recent Labs  Lab 03/08/21 0041 03/09/21 0057 03/10/21 0058 03/11/21 0140 03/12/21 0027  NA 137 138 138 137 137  K 3.3* 3.3* 4.4 4.3 4.9  CL 105 107 106 103 103  CO2 25 24 25 23 24   GLUCOSE 100* 96 97 88 102*  BUN 6 <5* 6 6 7   CREATININE 0.94 0.64 0.75 0.67 0.81  CALCIUM 8.6* 8.8* 9.2 9.7 9.8  MG  --   --  1.8  --   --      Liver Function Tests: No results for input(s): AST, ALT, ALKPHOS, BILITOT, PROT, ALBUMIN in the last 168 hours.  No results for input(s): LIPASE, AMYLASE in the last 168 hours. No results for input(s): AMMONIA in the last 168 hours.  CBC: Recent Labs  Lab 03/08/21 0041 03/09/21 0057 03/10/21 0058 03/10/21 2108 03/11/21 0140 03/12/21 0027  WBC 6.8 5.5 5.5  --  6.0 6.8  HGB 7.8* 8.0* 7.3* 9.2* 9.6* 9.8*  HCT 23.9* 25.9* 23.6* 28.5* 30.6* 30.8*  MCV 93.0 94.2 94.8  --  92.4 91.7  PLT 234 222 212  --  245 243     INR: Recent Labs  Lab 03/08/21 0041 03/09/21 0057 03/10/21 0058 03/11/21 0140 03/12/21 0027  INR 1.1 1.1 1.1 1.1 1.1     Other results:  Imaging   No results found.   Medications:     Scheduled Medications:  lactose free nutrition  237 mL Oral TID WC   potassium chloride  40 mEq Oral Q6H   sildenafil  20 mg Oral TID   vancomycin 1000 mg in NS (1000 ml) irrigation for Dr. Roxy Manns case   Irrigation To OR   Warfarin - Pharmacist Dosing Inpatient   Does not apply q1600   zinc sulfate  220 mg Oral Daily    Infusions:   ceFAZolin (ANCEF) IV 2 g (03/13/21 0521)   heparin 400 Units/hr (03/12/21 1901)    PRN Medications: acetaminophen, morphine injection, ondansetron (ZOFRAN) IV   Patient Profile   36 y/o w/ chronic systolic heart failure, NICM, s/p MH3 LVAD w/ prior history of MSSA DL infection, being admitted for recurrent DL infection.    Assessment/Plan:    1. Recurrent DL Infection  - previous MSSA DL infection 3/22, treated w/ surgical debridement and IV abx - Now w/ recurrent infection. Wound Cx  + MSSA. BCx NGTD  - CT of C/A/P w/ stranding in the anterior abdominal wall slightly above the umbilicus adjacent to lead extending to the left ventricular assist device, suggestive of possible cellulitis. No loculated fluid collection.  - Abx narrowed cefazolin - Had wound debridement 1/27 with wound vac placement/ 1/31 VAC dressing change with A Cell application.   - s/p wound vac change 2/6 - Afebrile site looks good - ID following, appreciate their assistance  - oral linezolid BID for 3 weeks at discharge then switch to chronic cefadroxil afterwards    2. Chronic systolic HF due to severe NICM - Echo EF 10% with biventricular failure - HM-3 VAD implant 02/26/19 - Echo 7/21 EF 40-45%--> repeat this admit 40-45%.  - Volume satus stable - Continue sildenafil - Off coreg and losartan.  MAPs stable 70-80s  3. VAD - Admit 12/21 with multiple low flow alarms. Rising LDH and decreasing PLTs. Suspected outflow graft kink occlusion. Patient placed on his left side and resolved.  - VAD interrogated personally. Parameters stable. - 1/31 Restarted coumadin . I - INR remains at 1.1 Continue heparin drip. Await INR today   4. HTN - MAPs ok - Off home Coreg and Losartan  5. Anemia, iron deficient  - Admit Hgb 7.0 - Transfused 1 u RBC 1/23 and 1 u 2/4.  - Hgb pending today. no overt bleeding.    6. Hypokalemia - follow daily and supp as needed  I reviewed the LVAD parameters from today, and compared the results to the patient's prior recorded data.  No programming changes were made.  The LVAD is functioning within specified parameters.  The patient performs LVAD self-test daily.  LVAD interrogation was negative for any significant power changes, alarms or PI events/speed drops.  LVAD equipment check completed and is in good  working order.  Back-up equipment present.   LVAD education done on emergency procedures and precautions and reviewed exit site care.  Length of Stay: [place], MD 03/13/2021, 6:31 AM  VAD Team --- VAD ISSUES ONLY--- Pager [phone] (7am - 7am)  Advanced Heart Failure Team  Pager [phone] (M-F; 7a - 5p)  Please contact [facility] for night-coverage after hours (5p -7a ) and weekends on [email or web address]

## 2021-03-13 NOTE — Progress Notes (Signed)
LVAD Coordinator Rounding Note:  Admitted 02/26/21 due to Dr. Prescott Gum service for VAD drive line infection.   HM III LVAD implanted on 02/26/19 by Dr. Maren Beach under Destination Therapy criteria.  Patient lying in bed watching TV and dozing. Denies complaints. His INR remains subtherapeutic with Heparin gtt.   Discussed how patient will get home when discharged, he has required Benedetto Goad in past. He says his niece may be able to pick him up; potential discharge this week when INR therapeutic.   Plan for PO Linezolid (Zyvox) 600 mg BID x 3 weeks at discharge per ID team. Per note in chart from this morning prior authorization has been approved with a $0 co-pay.   Discussed need for weekly clinic visits for wound vac change per Dr. Maren Beach. Patient will need to come on Mondays at 11:00 am for these appointments. Pt verbalized agreement and understanding of same. This allows him three days prior to visit to arrange for his Medicaid transportation. Pt verbalizes understanding and agreement to same.   Vital signs: Temp: 98.7 HR: 75 Doppler Pressure: 79 Automatic BP:  not documented O2 Sat: 98% RA Wt: 111.9>115.3>115.5>115>113.5>110.2>118>117.9>120.3>116.4>119 lbs  LVAD interrogation reveals:  Speed: 5400 Flow: 4.0 Power:  3.9w PI:  3.7 Hct: 20 - DO NOT CHANGE   Alarms: none Events: >70 PI events 03/12/21  Fixed speed: 5400 Low speed limit: 5100  Drive Line/Abdominal Abcsess:  Wound vac dressing clean, dry, and intact. Minimal serosanguinous drainage in wound vac canister. No leak alarms noted.   Labs:  LDH trend: 154>145>172>163>155>132>209>156>226>163>139  INR trend: 2.3>1.4>1.1>1.0>1.0>1.0>1.0>1.1>1.1>1.1>1.3   Anticoagulation Plan: -INR Goal: 2.0 - 2.5 -ASA Dose: none  Blood Products:  - 02/26/21>>1 units PC - 03/10/21>>1 unit PC  Device: N/A  Infection:  02/26/21 Drive line wound culture>> few staph aureus; final  02/26/21 Abdominal abscess culture>> few staph aureus;  final  02/26/21 Blood cultures >> no growth 5 days; final   Drips:  Heparin 400 units/hr  Plan/Recommendations:  1. Call VAD Coordinator if any VAD equipment or drive line issues. 2. Wound VAC changes per Dr. Lorrin Mais instructions.   [name], RN VAD Coordinator  Office: [phone]  24/7 Pager: [phone]

## 2021-03-13 NOTE — Progress Notes (Signed)
Mobility Specialist: Progress Note   03/13/21 1232  Mobility  Activity Ambulated independently in hallway  Level of Assistance Independent  Assistive Device  (IV Pole)  Distance Ambulated (ft) 800 ft  Activity Response Tolerated well  $Mobility charge 1 Mobility   Received pt in bed having no complaints and agreeable to mobility. Asymptomatic throughout ambulation, returned back to bed w/ call bell in reach and all needs met.  [facility] Demarlo Riojas Mobility Specialist Mobility Specialist 5 North: [phone] Mobility Specialist 6 North: [phone]

## 2021-03-14 LAB — LACTATE DEHYDROGENASE: LDH: 151 U/L (ref 98–192)

## 2021-03-14 LAB — TYPE AND SCREEN
ABO/RH(D): O POS
Antibody Screen: POSITIVE
Unit division: 0
Unit division: 0

## 2021-03-14 LAB — BASIC METABOLIC PANEL
Anion gap: 8 (ref 5–15)
BUN: 9 mg/dL (ref 6–20)
CO2: 22 mmol/L (ref 22–32)
Calcium: 9.5 mg/dL (ref 8.9–10.3)
Chloride: 106 mmol/L (ref 98–111)
Creatinine, Ser: 0.72 mg/dL (ref 0.61–1.24)
GFR, Estimated: >60 mL/min (ref >60)
Glucose, Bld: 100 mg/dL — ABNORMAL HIGH (ref 70–99)
Potassium: 4 mmol/L (ref 3.5–5.1)
Sodium: 136 mmol/L (ref 135–145)

## 2021-03-14 LAB — CBC
HCT: 30.8 % — ABNORMAL LOW (ref 39.0–52.0)
Hemoglobin: 9.7 g/dL — ABNORMAL LOW (ref 13.0–17.0)
MCH: 29.4 pg (ref 26.0–34.0)
MCHC: 31.5 g/dL (ref 30.0–36.0)
MCV: 93.3 fL (ref 80.0–100.0)
Platelets: 228 10*3/uL (ref 150–400)
RBC: 3.3 MIL/uL — ABNORMAL LOW (ref 4.22–5.81)
RDW: 17.6 % — ABNORMAL HIGH (ref 11.5–15.5)
WBC: 8.8 10*3/uL (ref 4.0–10.5)
nRBC: 0 % (ref 0.0–0.2)

## 2021-03-14 LAB — PROTIME-INR
INR: 1.4 — ABNORMAL HIGH (ref 0.8–1.2)
Prothrombin Time: 17.4 seconds — ABNORMAL HIGH (ref 11.4–15.2)

## 2021-03-14 LAB — BPAM RBC
Blood Product Expiration Date: 202303062359
Blood Product Expiration Date: 202303062359
ISSUE DATE / TIME: 202302041611
Unit Type and Rh: 5100
Unit Type and Rh: 5100

## 2021-03-14 LAB — HEPARIN LEVEL (UNFRACTIONATED): Heparin Unfractionated: <0.1 IU/mL — ABNORMAL LOW (ref 0.30–0.70)

## 2021-03-14 MED ORDER — WARFARIN SODIUM 10 MG PO TABS
10.0000 mg | ORAL_TABLET | Freq: Once | ORAL | Status: AC
  Administered 2021-03-14: 10 mg via ORAL
  Filled 2021-03-14: qty 1

## 2021-03-14 NOTE — Progress Notes (Signed)
Mobility Specialist: Progress Note   03/14/21 1051  Mobility  Activity Ambulated independently in hallway  Level of Assistance Independent  Assistive Device  (IV Pole)  Distance Ambulated (ft) 800 ft  Activity Response Tolerated well  $Mobility charge 1 Mobility   Received pt in bed having no complaints and agreeable to mobility. Asymptomatic throughout ambulation, returned back to bed w/ call bell in reach and all needs met.  [facility] Jon Martin Mobility Specialist Mobility Specialist 5 North: [phone] Mobility Specialist 6 North: [phone]

## 2021-03-14 NOTE — Plan of Care (Signed)
  Problem: Education: Goal: Patient will understand all VAD equipment and how it functions Outcome: Progressing Goal: Patient will be able to verbalize current INR target range and antiplatelet therapy for discharge home Outcome: Progressing   Problem: Cardiac: Goal: LVAD will function as expected and patient will experience no clinical alarms Outcome: Progressing   Problem: Education: Goal: Knowledge of General Education information will improve Description: Including pain rating scale, medication(s)/side effects and non-pharmacologic comfort measures Outcome: Progressing   Problem: Health Behavior/Discharge Planning: Goal: Ability to manage health-related needs will improve Outcome: Progressing   Problem: Clinical Measurements: Goal: Ability to maintain clinical measurements within normal limits will improve Outcome: Progressing Goal: Will remain free from infection Outcome: Progressing Goal: Diagnostic test results will improve Outcome: Progressing Goal: Respiratory complications will improve Outcome: Progressing Goal: Cardiovascular complication will be avoided Outcome: Progressing   Problem: Activity: Goal: Risk for activity intolerance will decrease Outcome: Progressing   Problem: Nutrition: Goal: Adequate nutrition will be maintained Outcome: Progressing   Problem: Coping: Goal: Level of anxiety will decrease Outcome: Progressing   Problem: Elimination: Goal: Will not experience complications related to bowel motility Outcome: Progressing Goal: Will not experience complications related to urinary retention Outcome: Progressing   Problem: Pain Managment: Goal: General experience of comfort will improve Outcome: Progressing   Problem: Safety: Goal: Ability to remain free from injury will improve Outcome: Progressing   Problem: Skin Integrity: Goal: Risk for impaired skin integrity will decrease Outcome: Progressing   

## 2021-03-14 NOTE — TOC Progression Note (Addendum)
Transition of Care [facility]) - Progression Note    Patient Details  Name: Liam Cammarata MRN: [id number] Date of Birth: [birth year]  [facility] CM/SW Contact  Beckie Busing, RN Phone Number:[phone]  03/14/2021, 3:21 PM  Clinical Narrative:    CM received message from SW stating that patient is discharging Thursday or Friday and will need wound vac. Patient info has been submitted to Blossom Hoops with [employer] for wound vac.   1624 Blossom Hoops with [employer] is working on referral. Patient has out of states medicaid and [place] is working now to determine which forms are needed for his insurance.    Expected Discharge Plan: Home w Home Health Services Barriers to Discharge: Continued Medical Work up  Expected Discharge Plan and Services Expected Discharge Plan: Home w Home Health Services   Discharge Planning Services: CM Consult Post Acute Care Choice: Home Health Living arrangements for the past 2 months: Single Family Home                                       Social Determinants of Health (SDOH) Interventions    Readmission Risk Interventions No flowsheet data found.

## 2021-03-14 NOTE — Progress Notes (Signed)
LVAD Coordinator Rounding Note:  Admitted 02/26/21 due to Dr. Prescott Gum service for VAD drive line infection.   HM III LVAD implanted on 02/26/19 by Dr. Maren Beach under Destination Therapy criteria.  Patient lying in bed watching Tik-Tok videos. Denies complaints. His INR remains subtherapeutic with Heparin gtt.   Plan for PO Linezolid (Zyvox) 600 mg BID x 3 weeks at discharge, followed by long-term suppressive Cefadroxil per ID team. Per note in chart  prior authorization has been approved with a $0 co-pay for Linezolid.   Discussed need for weekly clinic visits for wound vac change per Dr. Maren Beach. Patient will need to come on Mondays at 11:00 am for these appointments. Pt verbalized agreement and understanding of same. This allows him three days prior to visit to arrange for his Medicaid transportation. Pt verbalizes understanding and agreement to same.   Discussed how patient will get home when discharged, he has required Benedetto Goad in past. He says his niece may be able to pick him up; potential discharge this week when INR therapeutic.   Vital signs: Temp: 98.2 HR: 74 Doppler Pressure: 76 Automatic BP: 111/93 (101) O2 Sat: 95% RA Wt: 111.9>115.3>115.5>115>113.5>110.2>118>117.9>120.3>116.4>119>117.5 lbs  LVAD interrogation reveals:  Speed: 5400 Flow: 4.1 Power:  3.8w PI:  3.5 Hct: 20 - DO NOT CHANGE   Alarms: none Events: 54 PI events so far today   Fixed speed: 5400 Low speed limit: 5100  Drive Line/Abdominal Abcsess:  Wound vac dressing clean, dry, and intact. Minimal serosanguinous drainage in wound vac canister. No leak alarms noted.   Labs:  LDH trend: 154>145>172>163>155>132>209>156>226>163>139>151  INR trend: 2.3>1.4>1.1>1.0>1.0>1.0>1.0>1.1>1.1>1.1>1.3>1.4  Anticoagulation Plan: -INR Goal: 2.0 - 2.5 -ASA Dose: none  Blood Products:  - 02/26/21>>1 units PC - 03/10/21>>1 unit PC  Device: N/A  Infection:  02/26/21 Drive line wound culture>> few staph aureus; final   02/26/21 Abdominal abscess culture>> few staph aureus; final  02/26/21 Blood cultures >> no growth 5 days; final   Drips:  Heparin 400 units/hr  Plan/Recommendations:  1. Call VAD Coordinator if any VAD equipment or drive line issues. 2. Wound VAC changes per Dr. Lorrin Mais instructions.  Alyce Pagan RN VAD Coordinator  Office: [phone]  24/7 Pager: [phone]

## 2021-03-14 NOTE — Progress Notes (Addendum)
Patient ID: Vibhu Reymundo, male   DOB: Feb 10, 1985, 36 y.o.   MRN: [id number]   Advanced Heart Failure VAD Team Note  PCP-Cardiologist: Dr. Haroldine Laws   Subjective:    1/23: Admitted for recurrent DL infection - WCx + staph aureus. BCx NGTD. Abd CT tranding in the anterior abdominal wall slightly above the umbilicus adjacent to lead extending to the left ventricular assist device, suggestive of possible cellulitis. No loculated fluid collection.  1/23: Transfused 1uRBC Hgb 7.0 1/24 Echo EF 40-45%  1/27: Driveline site debridement, wound vac placed. Cultures 1/27 +rare staph aureus  1/31: S/P VAD dressing change around driveline with ACell applications. Coumadin was restarted.  2/6: Wound Vac changed in OR   Feels well. No complaints. Denies CP, dyspnea. No abdominal pain, fever or chills.   INR 1.4 today. On heparin gtt. No bleeding Hgb 9.7   LVAD INTERROGATION:  HeartMate III LVAD:   Flow 6.2 liters/min, speed 5450 power 4.0, PI 3.8  Numerous PI events.    Objective:    Vital Signs:   Temp:  [98.1 F (36.7 C)-98.7 F (37.1 C)] 98.1 F (36.7 C) (02/08 0343) Pulse Rate:  [71-75] 74 (02/07 1554) Resp:  [16-20] 16 (02/08 0343) BP: (80-104)/(54-78) 86/65 (02/08 0427) SpO2:  [95 %-98 %] 95 % (02/08 0343) Weight:  [53.3 kg] 53.3 kg (02/08 0639) Last BM Date: 03/11/21 Mean arterial Pressure 70-80s Intake/Output:   Intake/Output Summary (Last 24 hours) at 03/14/2021 0716 Last data filed at 03/14/2021 0230 Gross per 24 hour  Intake 631.07 ml  Output 1650 ml  Net -1018.93 ml     Physical Exam   Physical Exam: General:  Well appearing young male, in no distress.  HEENT: normal  Neck: supple. JVP not elevated.  Carotids 2+ bilat; no bruits. No lymphadenopathy or thryomegaly appreciated. Cor: LVAD hum.  Lungs: CTAB. No wheezing  Abdomen: soft, nontender, non-distended. No hepatosplenomegaly. No bruits or masses. Good bowel sounds.  + wound vac w/ good seal. Driveline site ok Anchor  in place.  Extremities: no cyanosis, clubbing, rash. Warm no edema  Neuro: alert & oriented x 3. No focal deficits. Moves all 4 without problem    Telemetry   SR 90s Personally reviewed   Labs   Basic Metabolic Panel: Recent Labs  Lab 03/10/21 0058 03/11/21 0140 03/12/21 0027 03/13/21 0557 03/14/21 0132  NA 138 137 137 136 136  K 4.4 4.3 4.9 4.1 4.0  CL 106 103 103 104 106  CO2 25 23 24 23 22   GLUCOSE 97 88 102* 129* 100*  BUN 6 6 7 9 9   CREATININE 0.75 0.67 0.81 0.74 0.72  CALCIUM 9.2 9.7 9.8 9.2 9.5  MG 1.8  --   --   --   --     Liver Function Tests: No results for input(s): AST, ALT, ALKPHOS, BILITOT, PROT, ALBUMIN in the last 168 hours.  No results for input(s): LIPASE, AMYLASE in the last 168 hours. No results for input(s): AMMONIA in the last 168 hours.  CBC: Recent Labs  Lab 03/10/21 0058 03/10/21 2108 03/11/21 0140 03/12/21 0027 03/13/21 0557 03/14/21 0132  WBC 5.5  --  6.0 6.8 9.5 8.8  HGB 7.3* 9.2* 9.6* 9.8* 9.5* 9.7*  HCT 23.6* 28.5* 30.6* 30.8* 29.2* 30.8*  MCV 94.8  --  92.4 91.7 92.4 93.3  PLT 212  --  245 243 271 228    INR: Recent Labs  Lab 03/10/21 0058 03/11/21 0140 03/12/21 0027 03/13/21 0557 03/14/21 0132  INR 1.1 1.1 1.1 1.3* 1.4*    Other results:    Imaging   No results found.   Medications:     Scheduled Medications:  lactose free nutrition  237 mL Oral TID WC   potassium chloride  40 mEq Oral BID   sildenafil  20 mg Oral TID   Warfarin - Pharmacist Dosing Inpatient   Does not apply q1600   zinc sulfate  220 mg Oral Daily    Infusions:   ceFAZolin (ANCEF) IV 2 g (03/14/21 0600)   heparin 400 Units/hr (03/13/21 1901)    PRN Medications: acetaminophen, morphine injection, ondansetron (ZOFRAN) IV   Patient Profile   36 y/o w/ chronic systolic heart failure, NICM, s/p MH3 LVAD w/ prior history of MSSA DL infection, being admitted for recurrent DL infection.   Assessment/Plan:    1. Recurrent DL  Infection  - previous MSSA DL infection 1/61, treated w/ surgical debridement and IV abx - Now w/ recurrent infection. Wound Cx  + MSSA. BCx NGTD  - CT of C/A/P w/ stranding in the anterior abdominal wall slightly above the umbilicus adjacent to lead extending to the left ventricular assist device, suggestive of possible cellulitis. No loculated fluid collection.  - Abx narrowed cefazolin - Had wound debridement 1/27 with wound vac placement/ 1/31 VAC dressing change with A Cell application.   - s/p wound vac change 2/6 - Afebrile site looks good - ID following, appreciate their assistance  - oral linezolid BID for 3 weeks at discharge then switch to chronic cefadroxil afterwards    2. Chronic systolic HF due to severe NICM - Echo EF 10% with biventricular failure - HM-3 VAD implant 02/26/19 - Echo 7/21 EF 40-45%--> repeat this admit 40-45%.  - Volume satus stable - Continue sildenafil - Off coreg and losartan.  MAPs stable 70-80s  3. VAD - Admit 12/21 with multiple low flow alarms. Rising LDH and decreasing PLTs. Suspected outflow graft kink occlusion. Patient placed on his left side and resolved.  - VAD interrogated personally. Parameters stable. - 1/31 Restarted coumadin .  - INR remains at 1.4 Continue heparin drip.   4. HTN - MAPs ok - Off home Coreg and Losartan  5. Anemia, iron deficient  - Admit Hgb 7.0 - Transfused 1 u RBC 1/23 and 1 u 2/4.  - Hgb 9.7 today. No overt bleeding.    6. Hypokalemia - stable, K 4.0  - follow daily and supp as needed  I reviewed the LVAD parameters from today, and compared the results to the patient's prior recorded data.  No programming changes were made.  The LVAD is functioning within specified parameters.  The patient performs LVAD self-test daily.  LVAD interrogation was negative for any significant power changes, alarms or PI events/speed drops.  LVAD equipment check completed and is in good working order.  Back-up equipment present.    LVAD education done on emergency procedures and precautions and reviewed exit site care.  Length of Stay: [address], PA-C 03/14/2021, 7:16 AM  VAD Team --- VAD ISSUES ONLY--- Pager [phone] (7am - 7am)  Advanced Heart Failure Team  Pager [phone] (M-F; 7a - 5p)  Please contact [facility] for night-coverage after hours (5p -7a ) and weekends on [email or web address]   Patient seen and examined with the above-signed Advanced Practice Provider and/or Housestaff. I personally reviewed laboratory data, imaging studies and relevant notes. I independently examined the patient and formulated the important aspects of the plan. I have  edited the note to reflect any of my changes or salient points. I have personally discussed the plan with the patient and/or family.  Remains on IV abx. Afebrile. Wound vac site ok. On heparin/warfarin. INR 1.3 -> 1.4  General:  NAD.  HEENT: normal  Neck: supple. JVP not elevated.  Carotids 2+ bilat; no bruits. No lymphadenopathy or thryomegaly appreciated. Cor: LVAD hum.  Lungs: Clear. Abdomen: soft, nontender, non-distended. No hepatosplenomegaly. No bruits or masses. Good bowel sounds. Driveline site with wound vac Anchor in place.  Extremities: no cyanosis, clubbing, rash. Warm no edema  Neuro: alert & oriented x 3. No focal deficits. Moves all 4 without problem   Wound vac sie ok. Volume status ok. Continue IV abx, Remains on heparin until INR 1.7 or greater. Discussed dosing with PharmD personally.  Glori Bickers, MD  8:23 AM

## 2021-03-14 NOTE — Progress Notes (Signed)
ANTICOAGULATION CONSULT NOTE  Pharmacy Consult for heparin>warfarin Indication:  LVAD  No Known Allergies  Patient Measurements: Height: 5\' 6"  (167.6 cm) Weight: 53.3 kg (117 lb 8.1 oz) IBW/kg (Calculated) : 63.8 Heparin Dosing Weight: 52kg  Vital Signs: Temp: 98.1 F (36.7 C) (02/08 0343) Temp Source: Oral (02/08 0343) BP: 86/65 (02/08 0427)  Labs: Recent Labs    03/12/21 0027 03/13/21 0557 03/14/21 0132  HGB 9.8* 9.5* 9.7*  HCT 30.8* 29.2* 30.8*  PLT 243 271 228  APTT 32  --   --   LABPROT 14.5 15.7* 17.4*  INR 1.1 1.3* 1.4*  HEPARINUNFRC <0.10* <0.10* <0.10*  CREATININE 0.81 0.74 0.72     Estimated Creatinine Clearance: 97.2 mL/min (by C-G formula based on SCr of 0.72 mg/dL).   Medical History: Past Medical History:  Diagnosis Date   Dilated cardiomyopathy (HCC)    Dilated cardiomyopathy (HCC) 02/2019   Polysubstance abuse (H[facility])     Assessment: 35 yoM with HM3 LVAD on chronic warfarin admitted with DLI. S/p I&D 1/27, heparin held overnight. Resumed 1/28 am.   Heparin level remains undetectable as expected on low fixed dose heparin at 400 units/hr. Hgb stable at 9.5, plt 271. LDH 139. S/p wound VAC change on 2/6 - back on fixed-dose heparin at 400 units/hr.  INR finally starting to increase, up to 1.4 today.  Prior to admit dose was 2.5 mg (5 mg x 0.5) every Sun, Tue, Thu; 5 mg (5 mg x 1) all other days.  Has been requiring doses much higher than home doses while in hospital.  ?poor diet at home  Goal of Therapy:  INR goal 2-2.5 Heparin level <0.3 units/ml Monitor platelets by anticoagulation protocol: Yes   Plan:  Will give warfarin 10 mg today Continue daily INR checks Continue heparin infusion at 400 units/hr.  04-30-1990 PharmD., BCPS Clinical Pharmacist 03/14/2021 8:16 AM

## 2021-03-14 NOTE — Plan of Care (Signed)

## 2021-03-15 LAB — BASIC METABOLIC PANEL
Anion gap: 7 (ref 5–15)
BUN: 10 mg/dL (ref 6–20)
CO2: 24 mmol/L (ref 22–32)
Calcium: 9.4 mg/dL (ref 8.9–10.3)
Chloride: 106 mmol/L (ref 98–111)
Creatinine, Ser: 0.68 mg/dL (ref 0.61–1.24)
GFR, Estimated: >60 mL/min (ref >60)
Glucose, Bld: 105 mg/dL — ABNORMAL HIGH (ref 70–99)
Potassium: 3.9 mmol/L (ref 3.5–5.1)
Sodium: 137 mmol/L (ref 135–145)

## 2021-03-15 LAB — CBC
HCT: 29.8 % — ABNORMAL LOW (ref 39.0–52.0)
Hemoglobin: 9.6 g/dL — ABNORMAL LOW (ref 13.0–17.0)
MCH: 30.1 pg (ref 26.0–34.0)
MCHC: 32.2 g/dL (ref 30.0–36.0)
MCV: 93.4 fL (ref 80.0–100.0)
Platelets: 243 10*3/uL (ref 150–400)
RBC: 3.19 MIL/uL — ABNORMAL LOW (ref 4.22–5.81)
RDW: 17.4 % — ABNORMAL HIGH (ref 11.5–15.5)
WBC: 8.7 10*3/uL (ref 4.0–10.5)
nRBC: 0 % (ref 0.0–0.2)

## 2021-03-15 LAB — MAGNESIUM: Magnesium: 1.6 mg/dL — ABNORMAL LOW (ref 1.7–2.4)

## 2021-03-15 LAB — PROTIME-INR
INR: 1.6 — ABNORMAL HIGH (ref 0.8–1.2)
Prothrombin Time: 19.1 seconds — ABNORMAL HIGH (ref 11.4–15.2)

## 2021-03-15 LAB — HEPARIN LEVEL (UNFRACTIONATED): Heparin Unfractionated: <0.1 IU/mL — ABNORMAL LOW (ref 0.30–0.70)

## 2021-03-15 LAB — LACTATE DEHYDROGENASE: LDH: 162 U/L (ref 98–192)

## 2021-03-15 MED ORDER — WARFARIN SODIUM 2.5 MG PO TABS
12.5000 mg | ORAL_TABLET | Freq: Once | ORAL | Status: AC
  Administered 2021-03-15: 12.5 mg via ORAL
  Filled 2021-03-15: qty 1

## 2021-03-15 MED ORDER — MAGNESIUM SULFATE 4 GM/100ML IV SOLN
4.0000 g | Freq: Once | INTRAVENOUS | Status: AC
  Administered 2021-03-15: 4 g via INTRAVENOUS
  Filled 2021-03-15: qty 100

## 2021-03-15 NOTE — Progress Notes (Signed)
LVAD Coordinator Rounding Note:  Admitted 02/26/21 due to Dr. Prescott Gum service for VAD drive line infection.   HM III LVAD implanted on 02/26/19 by Dr. Maren Beach under Destination Therapy criteria.  Patient lying in bed watching TV. Denies complaints. His INR 1.6 with Heparin gtt.   Plan for PO Linezolid (Zyvox) 600 mg BID x 3 weeks at discharge, followed by long-term suppressive Cefadroxil per ID team. Per note in chart  prior authorization has been approved with a $0 co-pay for Linezolid.   Discussed need for weekly clinic visits for wound vac change per Dr. Maren Beach. Patient will need to come on Mondays at 11:00 am for these appointments. Pt verbalized agreement and understanding of same. This allows him three days prior to visit to arrange for his Medicaid transportation. Pt verbalizes understanding and agreement to same.   Discussed how patient will get home when discharged, he has required Benedetto Goad in past. He says his niece may be able to pick him up; potential discharge this week when INR therapeutic.   Vital signs: Temp: 98.4 HR: 99 Doppler Pressure: 76 Automatic BP: 92/78 (83) O2 Sat: 98% RA Wt: 111.9>115.3>115.5>115>113.5>110.2>118>117.9>120.3>116.4>119>117.5>119 lbs  LVAD interrogation reveals:  Speed: 5400 Flow: 4.1 Power:  3.9w PI:  3.9 Hct: 20 - DO NOT CHANGE   Alarms: none Events: 40+ PI events so far today   Fixed speed: 5400 Low speed limit: 5100  Drive Line/Abdominal Abcsess:  Wound vac dressing clean, dry, and intact. Minimal serosanguinous drainage in wound vac canister. No leak alarms noted.   Labs:  LDH trend: 154>145>172>163>155>132>209>156>226>163>139>151>162  INR trend: 2.3>1.4>1.1>1.0>1.0>1.0>1.0>1.1>1.1>1.1>1.3>1.4>1.6  Anticoagulation Plan: -INR Goal: 2.0 - 2.5 -ASA Dose: none  Blood Products:  - 02/26/21>>1 units PC - 03/10/21>>1 unit PC  Device: N/A  Infection:  02/26/21 Drive line wound culture>> few staph aureus; final  02/26/21 Abdominal  abscess culture>> few staph aureus; final  02/26/21 Blood cultures >> no growth 5 days; final   Drips:  Heparin 400 units/hr  Plan/Recommendations:  1. Call VAD Coordinator if any VAD equipment or drive line issues. 2. Wound VAC changes per Dr. Lorrin Mais instructions.  Carlton Adam RN VAD Coordinator  Office: [phone]  24/7 Pager: [phone]

## 2021-03-15 NOTE — Progress Notes (Signed)
[place] for heparin>warfarin Indication:  LVAD  No Known Allergies  Patient Measurements: Height: 5\' 6"  (167.6 cm) Weight: 54 kg (119 lb 0.8 oz) IBW/kg (Calculated) : 63.8 Heparin Dosing Weight: 52kg  Vital Signs: Temp: 98.4 F (36.9 C) (02/09 0754) Temp Source: Oral (02/09 0351) BP: 94/70 (02/09 1109) Pulse Rate: 99 (02/09 0754)  Labs: Recent Labs    03/13/21 0557 03/14/21 0132 03/15/21 0435  HGB 9.5* 9.7* 9.6*  HCT 29.2* 30.8* 29.8*  PLT 271 228 243  LABPROT 15.7* 17.4* 19.1*  INR 1.3* 1.4* 1.6*  HEPARINUNFRC <0.10* <0.10* <0.10*  CREATININE 0.74 0.72 0.68     Estimated Creatinine Clearance: 98.4 mL/min (by C-G formula based on SCr of 0.68 mg/dL).   Medical History: Past Medical History:  Diagnosis Date   Dilated cardiomyopathy ([place])    Dilated cardiomyopathy ([place]) 02/2019   Polysubstance abuse ([place])     Assessment: 42 yoM with HM3 LVAD on chronic warfarin admitted with DLI. S/p I&D 1/27, heparin held overnight. Resumed 1/28 am.   Heparin level remains undetectable as expected on low fixed dose heparin at 400 units/hr. Hgb stable at 9.5, plt 271. LDH 139. S/p wound VAC change on 2/6 - back on fixed-dose heparin at 400 units/hr.  INR continues to increase, up to 1.6 today.  Prior to admit dose was 2.5 mg (5 mg x 0.5) every Sun, Tue, Thu; 5 mg (5 mg x 1) all other days.  Has been requiring doses much higher than home doses while in hospital.  ?poor diet at home. He is also receiving nutritional supplements for the past week.   Goal of Therapy:  INR goal 2-2.5 Heparin level <0.3 units/ml Monitor platelets by anticoagulation protocol: Yes   Plan:  Will give warfarin 12.5 mg today Continue daily INR checks Continue heparin infusion at 400 units/hr.  Erin Hearing PharmD., BCPS Clinical Pharmacist 03/15/2021 1:13 PM

## 2021-03-15 NOTE — Progress Notes (Addendum)
Patient ID: Jon Martin, male   DOB: Jun 29, 1985, 36 y.o.   MRN: [id number]   Advanced Heart Failure VAD Team Note  PCP-Cardiologist: Dr. Haroldine Laws   Subjective:    1/23: Admitted for recurrent DL infection - WCx + staph aureus. BCx NGTD. Abd CT tranding in the anterior abdominal wall slightly above the umbilicus adjacent to lead extending to the left ventricular assist device, suggestive of possible cellulitis. No loculated fluid collection.  1/23: Transfused 1uRBC Hgb 7.0 1/24 Echo EF 40-45%  1/27: Driveline site debridement, wound vac placed. Cultures 1/27 +rare staph aureus  1/31: S/P VAD dressing change around driveline with ACell applications. Coumadin was restarted.  2/6: Wound Vac changed in OR   Remains AF. WBC 8.7.   INR 1.6 today. On heparin gtt. No bleeding, Hgb stable 9.6. While in room, I noticed a Boost nutritional shake on bedside table, reports he has been consuming ~2/day during hospitalization (will d/w pharmacy).   Mg low, 1.6. K 3.9    He feels well. No pain. Denies dyspnea. Appetite is good.   LVAD INTERROGATION:  HeartMate III LVAD:   Flow 4.2 liters/min, speed 5450 power 3.8, PI 3.9  Numerous PI events.    Objective:    Vital Signs:   Temp:  [98.2 F (36.8 C)-98.6 F (37 C)] 98.4 F (36.9 C) (02/09 0754) Pulse Rate:  [74-99] 99 (02/09 0754) Resp:  [15-20] 16 (02/09 0754) BP: (86-144)/(57-123) 92/78 (02/09 0754) SpO2:  [95 %-98 %] 98 % (02/09 0351) Weight:  [54 kg] 54 kg (02/09 0500) Last BM Date: 03/11/21 Mean arterial Pressure 70-80s Intake/Output:   Intake/Output Summary (Last 24 hours) at 03/15/2021 0914 Last data filed at 03/15/2021 0500 Gross per 24 hour  Intake 1200 ml  Output 1650 ml  Net -450 ml     Physical Exam   Physical Exam: General:  well appearing, NAD  HEENT: normal  Neck: supple. JVP flat.  Carotids 2+ bilat; no bruits. No lymphadenopathy or thryomegaly appreciated. Cor: LVAD hum.  Lungs: clear  Abdomen: soft, nontender,  non-distended. No hepatosplenomegaly. No bruits or masses. Good bowel sounds.  + wound vac w/ good seal. Driveline site ok Anchor in place.  Extremities: no cyanosis, clubbing, rash. Warm no edema  Neuro: alert & oriented x 3. No focal deficits. Moves all 4 without problem    Telemetry   SR- ST 90s-low 100s Personally reviewed   Labs   Basic Metabolic Panel: Recent Labs  Lab 03/10/21 0058 03/11/21 0140 03/12/21 0027 03/13/21 0557 03/14/21 0132 03/15/21 0435  NA 138 137 137 136 136 137  K 4.4 4.3 4.9 4.1 4.0 3.9  CL 106 103 103 104 106 106  CO2 25 23 24 23 22 24   GLUCOSE 97 88 102* 129* 100* 105*  BUN 6 6 7 9 9 10   CREATININE 0.75 0.67 0.81 0.74 0.72 0.68  CALCIUM 9.2 9.7 9.8 9.2 9.5 9.4  MG 1.8  --   --   --   --  1.6*    Liver Function Tests: No results for input(s): AST, ALT, ALKPHOS, BILITOT, PROT, ALBUMIN in the last 168 hours.  No results for input(s): LIPASE, AMYLASE in the last 168 hours. No results for input(s): AMMONIA in the last 168 hours.  CBC: Recent Labs  Lab 03/11/21 0140 03/12/21 0027 03/13/21 0557 03/14/21 0132 03/15/21 0435  WBC 6.0 6.8 9.5 8.8 8.7  HGB 9.6* 9.8* 9.5* 9.7* 9.6*  HCT 30.6* 30.8* 29.2* 30.8* 29.8*  MCV 92.4 91.7 92.4  93.3 93.4  PLT 245 243 271 228 243    INR: Recent Labs  Lab 03/11/21 0140 03/12/21 0027 03/13/21 0557 03/14/21 0132 03/15/21 0435  INR 1.1 1.1 1.3* 1.4* 1.6*    Other results:    Imaging   No results found.   Medications:     Scheduled Medications:  lactose free nutrition  237 mL Oral TID WC   potassium chloride  40 mEq Oral BID   sildenafil  20 mg Oral TID   Warfarin - Pharmacist Dosing Inpatient   Does not apply q1600   zinc sulfate  220 mg Oral Daily    Infusions:   ceFAZolin (ANCEF) IV 2 g (03/14/21 2130)   heparin 400 Units/hr (03/14/21 1905)    PRN Medications: acetaminophen, morphine injection, ondansetron (ZOFRAN) IV   Patient Profile   36 y/o w/ chronic systolic heart  failure, NICM, s/p MH3 LVAD w/ prior history of MSSA DL infection, being admitted for recurrent DL infection.   Assessment/Plan:    1. Recurrent DL Infection  - previous MSSA DL infection 2/56, treated w/ surgical debridement and IV abx - Now w/ recurrent infection. Wound Cx  + MSSA. BCx NGTD  - CT of C/A/P w/ stranding in the anterior abdominal wall slightly above the umbilicus adjacent to lead extending to the left ventricular assist device, suggestive of possible cellulitis. No loculated fluid collection.  - Abx narrowed cefazolin - Had wound debridement 1/27 with wound vac placement/ 1/31 VAC dressing change with A Cell application.   - s/p wound vac change 2/6 - Afebrile site looks good - ID following, appreciate their assistance  - oral linezolid BID for 3 weeks at discharge then switch to chronic cefadroxil afterwards    2. Chronic systolic HF due to severe NICM - Echo EF 10% with biventricular failure - HM-3 VAD implant 02/26/19 - Echo 7/21 EF 40-45%--> repeat this admit 40-45%.  - Volume satus stable - Continue sildenafil - Off coreg and losartan.  MAPs stable 70-80s  3. VAD - Admit 12/21 with multiple low flow alarms. Rising LDH and decreasing PLTs. Suspected outflow graft kink occlusion. Patient placed on his left side and resolved.  - VAD interrogated personally. Parameters stable. - 1/31 Restarted coumadin .  - INR 1.6 today, Continue heparin drip until INR 1.7 or greater.   4. HTN - MAPs ok - Off home Coreg and Losartan  5. Anemia, iron deficient  - Admit Hgb 7.0 - Transfused 1 u RBC 1/23 and 1 u 2/4.  - Hgb 9.6 today. No overt bleeding.    6. Hypokalemia/Hypomagnesemia  - K 3.9 - Mg 1.6  - Supp Mg   I reviewed the LVAD parameters from today, and compared the results to the patient's prior recorded data.  No programming changes were made.  The LVAD is functioning within specified parameters.  The patient performs LVAD self-test daily.  LVAD interrogation was  negative for any significant power changes, alarms or PI events/speed drops.  LVAD equipment check completed and is in good working order.  Back-up equipment present.   LVAD education done on emergency procedures and precautions and reviewed exit site care.  Length of Stay: [address], PA-C 03/15/2021, 9:14 AM  VAD Team --- VAD ISSUES ONLY--- Pager [phone] (7am - 7am)  Advanced Heart Failure Team  Pager [phone] (M-F; 7a - 5p)  Please contact [facility] for night-coverage after hours (5p -7a ) and weekends on [email or web address]  Patient seen and examined with the  above-signed Engineer, building services. I personally reviewed laboratory data, imaging studies and relevant notes. I independently examined the patient and formulated the important aspects of the plan. I have edited the note to reflect any of my changes or salient points. I have personally discussed the plan with the patient and/or family.  Remains on IV abx. Afebrile. Remains on heparin/warfarin. INR 1.6. No bleeding.   Denies SOB, orthopnea or PND. Wound vac ok   General:  NAD.  HEENT: normal  Neck: supple. JVP not elevated.  Carotids 2+ bilat; no bruits. No lymphadenopathy or thryomegaly appreciated. Cor: LVAD hum.  Lungs: Clear. Abdomen:  soft, nontender, non-distended. No hepatosplenomegaly. No bruits or masses. Good bowel sounds. Driveline site with wound vac  Anchor in place.  Extremities: no cyanosis, clubbing, rash. Warm no edema  Neuro: alert & oriented x 3. No focal deficits. Moves all 4 without problem   Wound site ok. Vac working well. Continue IV abx. Continue heparin/warfarin. Can stop heparin when INR 1.8 or greater.   VAD interrogated personally. Parameters stable.  Glori Bickers, MD  12:57 PM

## 2021-03-15 NOTE — TOC CM/SW Note (Signed)
HF TOC CM faxed signed wound vac order to [employer] rep, Olivia Mackie. States she will start insurance auth, plan delivery today. [place], Heart Failure TOC CM [phone]

## 2021-03-15 NOTE — Plan of Care (Signed)
°  Problem: Education: Goal: Patient will understand all VAD equipment and how it functions Outcome: Progressing Goal: Patient will be able to verbalize current INR target range and antiplatelet therapy for discharge home Outcome: Progressing   Problem: Cardiac: Goal: LVAD will function as expected and patient will experience no clinical alarms Outcome: Progressing   Problem: Education: Goal: Knowledge of General Education information will improve Description: Including pain rating scale, medication(s)/side effects and non-pharmacologic comfort measures Outcome: Progressing   Problem: Health Behavior/Discharge Planning: Goal: Ability to manage health-related needs will improve Outcome: Progressing   Problem: Clinical Measurements: Goal: Ability to maintain clinical measurements within normal limits will improve Outcome: Progressing Goal: Will remain free from infection Outcome: Progressing Goal: Diagnostic test results will improve Outcome: Progressing Goal: Respiratory complications will improve Outcome: Progressing Goal: Cardiovascular complication will be avoided Outcome: Progressing   Problem: Activity: Goal: Risk for activity intolerance will decrease Outcome: Progressing   Problem: Nutrition: Goal: Adequate nutrition will be maintained Outcome: Progressing   Problem: Coping: Goal: Level of anxiety will decrease Outcome: Progressing   Problem: Pain Managment: Goal: General experience of comfort will improve Outcome: Progressing   Problem: Safety: Goal: Ability to remain free from injury will improve Outcome: Progressing

## 2021-03-16 ENCOUNTER — Other Ambulatory Visit (HOSPITAL_COMMUNITY): Payer: Self-pay

## 2021-03-16 ENCOUNTER — Other Ambulatory Visit (HOSPITAL_COMMUNITY): Payer: Self-pay | Admitting: *Deleted

## 2021-03-16 DIAGNOSIS — Z7901 Long term (current) use of anticoagulants: Secondary | ICD-10-CM

## 2021-03-16 DIAGNOSIS — Z95811 Presence of heart assist device: Secondary | ICD-10-CM

## 2021-03-16 LAB — BASIC METABOLIC PANEL
Anion gap: 10 (ref 5–15)
BUN: 8 mg/dL (ref 6–20)
CO2: 21 mmol/L — ABNORMAL LOW (ref 22–32)
Calcium: 9.1 mg/dL (ref 8.9–10.3)
Chloride: 105 mmol/L (ref 98–111)
Creatinine, Ser: 0.76 mg/dL (ref 0.61–1.24)
GFR, Estimated: >60 mL/min (ref >60)
Glucose, Bld: 130 mg/dL — ABNORMAL HIGH (ref 70–99)
Potassium: 3.7 mmol/L (ref 3.5–5.1)
Sodium: 136 mmol/L (ref 135–145)

## 2021-03-16 LAB — LACTATE DEHYDROGENASE: LDH: 195 U/L — ABNORMAL HIGH (ref 98–192)

## 2021-03-16 LAB — CBC
HCT: 29.8 % — ABNORMAL LOW (ref 39.0–52.0)
Hemoglobin: 9.4 g/dL — ABNORMAL LOW (ref 13.0–17.0)
MCH: 29.7 pg (ref 26.0–34.0)
MCHC: 31.5 g/dL (ref 30.0–36.0)
MCV: 94 fL (ref 80.0–100.0)
Platelets: 251 10*3/uL (ref 150–400)
RBC: 3.17 MIL/uL — ABNORMAL LOW (ref 4.22–5.81)
RDW: 17.4 % — ABNORMAL HIGH (ref 11.5–15.5)
WBC: 8.8 10*3/uL (ref 4.0–10.5)
nRBC: 0 % (ref 0.0–0.2)

## 2021-03-16 LAB — PROTIME-INR
INR: 1.7 — ABNORMAL HIGH (ref 0.8–1.2)
Prothrombin Time: 19.5 seconds — ABNORMAL HIGH (ref 11.4–15.2)

## 2021-03-16 LAB — MAGNESIUM: Magnesium: 2 mg/dL (ref 1.7–2.4)

## 2021-03-16 LAB — HEPARIN LEVEL (UNFRACTIONATED): Heparin Unfractionated: <0.1 IU/mL — ABNORMAL LOW (ref 0.30–0.70)

## 2021-03-16 MED ORDER — POTASSIUM CHLORIDE CRYS ER 20 MEQ PO TBCR
40.0000 meq | EXTENDED_RELEASE_TABLET | Freq: Every day | ORAL | 5 refills | Status: DC
  Filled 2021-03-16: qty 60, 30d supply, fill #0

## 2021-03-16 MED ORDER — WARFARIN SODIUM 7.5 MG PO TABS
15.0000 mg | ORAL_TABLET | Freq: Once | ORAL | Status: AC
  Administered 2021-03-16: 15 mg via ORAL
  Filled 2021-03-16: qty 2

## 2021-03-16 MED ORDER — WARFARIN SODIUM 5 MG PO TABS
5.0000 mg | ORAL_TABLET | Freq: Every day | ORAL | 11 refills | Status: DC
  Filled 2021-03-16: qty 30, 30d supply, fill #0

## 2021-03-16 MED ORDER — LINEZOLID 600 MG PO TABS
600.0000 mg | ORAL_TABLET | Freq: Two times a day (BID) | ORAL | 0 refills | Status: DC
Start: 2021-03-16 — End: 2021-04-02
  Filled 2021-03-16: qty 42, 21d supply, fill #0

## 2021-03-16 MED ORDER — ZINC SULFATE 220 (50 ZN) MG PO TABS
220.0000 mg | ORAL_TABLET | Freq: Every day | ORAL | 5 refills | Status: DC
  Filled 2021-03-16: qty 30, 30d supply, fill #0

## 2021-03-16 MED ORDER — SILDENAFIL CITRATE 20 MG PO TABS
20.0000 mg | ORAL_TABLET | Freq: Three times a day (TID) | ORAL | 11 refills | Status: DC
  Filled 2021-03-16: qty 90, 30d supply, fill #0

## 2021-03-16 NOTE — Progress Notes (Signed)
LVAD Coordinator Rounding Note:  Admitted 02/26/21 due to Dr. Prescott Gum service for VAD drive line infection.   HM III LVAD implanted on 02/26/19 by Dr. Maren Beach under Destination Therapy criteria.  Patient lying in bed watching videos on his phone. Denies complaints. His INR 1.7 with Heparin gtt.   Plan for PO Linezolid (Zyvox) 600 mg BID x 3 weeks at discharge, followed by long-term suppressive Cefadroxil per ID team. Per note in chart prior authorization has been approved with a $0 co-pay for Linezolid and Cefadroxil.   Plan for discharge this afternoon once medications and wound vac supplies are at bedside. Pt assures me that his sister Ivar Drape will bring him to his clinic appt Monday at 11:00. Reminded pt he is responsible for arranging Medicaid transportation, or have his niece or sister bring him to all his follow up appts. He verbalized understanding. Sister's phone number is: [phone].   Medications delivered to bedside from [facility] pharmacy. VAD coordinator filled pillbox for pt prior to discharge. Reviewed medications placed in morning, noon, and evening slots. Requested pt bring all his medications and pillbox to clinic on Monday for VAD coordinator to refill. He verbalized understanding. VAD coordinator removed prior Coumadin prescription from medication bag he brought to the hospital to avoid confusion. Pt aware.   Requested pt bring all his VAD equipment to appt to clinic on Monday for annual maintenance. He verbalized understanding. All the above instructions were also provided to pt on AVS.   Pt received Coumadin prior to discharge.   Vital signs: Temp: 98.2 HR: 82 Doppler Pressure: 82 Automatic BP: 110/76 (88) O2 Sat: 97% RA Wt: 111.9>115.3>115.5>115>113.5>110.2>118>117.9>120.3>116.4>119>117.5>119>1118.6 lbs  LVAD interrogation reveals:  Speed: 5400 Flow: 4.0 Power:  3.9w PI:  4.0 Hct: 20 - DO NOT CHANGE   Alarms: none Events: 29 PI events so far today   Fixed  speed: 5400 Low speed limit: 5100  Drive Line/Abdominal Abcsess:  Wound vac dressing clean, dry, and intact. Minimal serosanguinous drainage in wound vac canister. No leak alarms noted. Plan for wound vac change in VAD clinic Monday 03/19/21 at 11:00 with Dr Donata Clay.   Labs:  LDH trend: 154>145>172>163>155>132>209>156>226>163>139>151>162>195  INR trend: 2.3>1.4>1.1>1.0>1.0>1.0>1.0>1.1>1.1>1.1>1.3>1.4>1.6>1.7  Anticoagulation Plan: -INR Goal: 2.0 - 2.5 -ASA Dose: none  Blood Products:  - 02/26/21>>1 units PC - 03/10/21>>1 unit PC  Device: N/A  Infection:  02/26/21 Drive line wound culture>> few staph aureus; final  02/26/21 Abdominal abscess culture>> few staph aureus; final  02/26/21 Blood cultures >> no growth 5 days; final   Drips:  Heparin 400 units/hr  Plan/Recommendations:  1. Call VAD Coordinator if any VAD equipment or drive line issues. 2. Wound VAC changes per Dr. Lorrin Mais instructions. 3. Plan to discharge home today per Dr Gala Romney. VAD clinic follow up scheduled for Monday at 11:00 for wound vac change.   Alyce Pagan RN VAD Coordinator  Office: [phone]  24/7 Pager: [phone]

## 2021-03-16 NOTE — TOC CM/SW Note (Addendum)
311 pm [facility] Goad ride called for [name] RN3 CCM, Heart Failure TOC CM [phone]   HF TOC CM delivered [employer] wound vac to pt's room to Unit RN. Received document for pt to signed from rep Durand with [employer]. Pt did sign form and scanned to her email. Pt signed [facility] transportation waiver, scanned to transportation. Jon Martin RN3 CCM, Heart Failure TOC CM [phone]

## 2021-03-16 NOTE — Progress Notes (Addendum)
Patient ID: Tannon Mirro, male   DOB: 1985-09-12, 36 y.o.   MRN: [id number]   Advanced Heart Failure VAD Team Note  PCP-Cardiologist: Dr. Haroldine Laws   Subjective:    1/23: Admitted for recurrent DL infection - WCx + staph aureus. BCx NGTD. Abd CT tranding in the anterior abdominal wall slightly above the umbilicus adjacent to lead extending to the left ventricular assist device, suggestive of possible cellulitis. No loculated fluid collection.  1/23: Transfused 1uRBC Hgb 7.0 1/24 Echo EF 40-45%  1/27: Driveline site debridement, wound vac placed. Cultures 1/27 +rare staph aureus  1/31: S/P VAD dressing change around driveline with ACell applications. Coumadin was restarted.  2/6: Wound Vac changed in OR   Continues on antibiotics. Afebrile.   INR slowly trending up, 1.7 today. Hgb stable on heparin gtt.   Feels ok. No dyspnea. No CP.   LVAD INTERROGATION:  HeartMate III LVAD:   Flow 4.5 liters/min, speed 5400 power 3.9, PI 3.3  6 PI events.    Objective:    Vital Signs:   Temp:  [98.3 F (36.8 C)-98.5 F (36.9 C)] 98.3 F (36.8 C) (02/10 0318) Pulse Rate:  [85-99] 85 (02/10 0318) Resp:  [14-18] 16 (02/10 0318) BP: (90-110)/(65-97) 110/97 (02/10 0318) SpO2:  [95 %-98 %] 95 % (02/10 0318) Weight:  [53.8 kg] 53.8 kg (02/10 0521) Last BM Date: 03/11/21 Mean arterial Pressure 70-80s Intake/Output:   Intake/Output Summary (Last 24 hours) at 03/16/2021 0718 Last data filed at 03/16/2021 0318 Gross per 24 hour  Intake 100 ml  Output 1300 ml  Net -1200 ml     Physical Exam   Physical Exam: General:  well appearing young male, no distress   HEENT: normal  Neck: supple. JVD not elevated.  Carotids 2+ bilat; no bruits. No lymphadenopathy or thryomegaly appreciated. Cor: LVAD hum.  Lungs: clear bilaterally, no wheezing  Abdomen: soft, nontender, non-distended. No hepatosplenomegaly. No bruits or masses. Good bowel sounds.  + wound vac w/ good seal. Driveline site ok Anchor in  place.  Extremities: no cyanosis, clubbing, rash. Warm and dry.No edema  Neuro: alert & oriented x 3. No focal deficits. Moves all 4 without problem    Telemetry   NSR- Sinus tach 90s-low 100s Personally reviewed   Labs   Basic Metabolic Panel: Recent Labs  Lab 03/10/21 0058 03/11/21 0140 03/12/21 0027 03/13/21 0557 03/14/21 0132 03/15/21 0435 03/16/21 0050  NA 138   < > 137 136 136 137 136  K 4.4   < > 4.9 4.1 4.0 3.9 3.7  CL 106   < > 103 104 106 106 105  CO2 25   < > 24 23 22 24  21*  GLUCOSE 97   < > 102* 129* 100* 105* 130*  BUN 6   < > 7 9 9 10 8   CREATININE 0.75   < > 0.81 0.74 0.72 0.68 0.76  CALCIUM 9.2   < > 9.8 9.2 9.5 9.4 9.1  MG 1.8  --   --   --   --  1.6* 2.0   < > = values in this interval not displayed.    Liver Function Tests: No results for input(s): AST, ALT, ALKPHOS, BILITOT, PROT, ALBUMIN in the last 168 hours.  No results for input(s): LIPASE, AMYLASE in the last 168 hours. No results for input(s): AMMONIA in the last 168 hours.  CBC: Recent Labs  Lab 03/12/21 0027 03/13/21 0557 03/14/21 0132 03/15/21 0435 03/16/21 0050  WBC 6.8 9.5 8.8  8.7 8.8  HGB 9.8* 9.5* 9.7* 9.6* 9.4*  HCT 30.8* 29.2* 30.8* 29.8* 29.8*  MCV 91.7 92.4 93.3 93.4 94.0  PLT 243 271 228 243 251    INR: Recent Labs  Lab 03/12/21 0027 03/13/21 0557 03/14/21 0132 03/15/21 0435 03/16/21 0050  INR 1.1 1.3* 1.4* 1.6* 1.7*    Other results:    Imaging   No results found.   Medications:     Scheduled Medications:  lactose free nutrition  237 mL Oral TID WC   potassium chloride  40 mEq Oral BID   sildenafil  20 mg Oral TID   Warfarin - Pharmacist Dosing Inpatient   Does not apply q1600   zinc sulfate  220 mg Oral Daily    Infusions:   ceFAZolin (ANCEF) IV 2 g (03/16/21 0647)   heparin 400 Units/hr (03/14/21 1905)    PRN Medications: acetaminophen, morphine injection, ondansetron (ZOFRAN) IV   Patient Profile   36 y/o w/ chronic systolic  heart failure, NICM, s/p MH3 LVAD w/ prior history of MSSA DL infection, being admitted for recurrent DL infection.   Assessment/Plan:    1. Recurrent DL Infection  - previous MSSA DL infection 3/22, treated w/ surgical debridement and IV abx - Now w/ recurrent infection. Wound Cx  + MSSA. BCx NGTD  - CT of C/A/P w/ stranding in the anterior abdominal wall slightly above the umbilicus adjacent to lead extending to the left ventricular assist device, suggestive of possible cellulitis. No loculated fluid collection.  - Abx narrowed cefazolin - Had wound debridement 1/27 with wound vac placement/ 1/31 VAC dressing change with A Cell application.   - s/p wound vac change 2/6 - Afebrile site looks good - ID following, appreciate their assistance  - oral linezolid BID for 3 weeks at discharge then switch to chronic cefadroxil afterwards    2. Chronic systolic HF due to severe NICM - Echo EF 10% with biventricular failure - HM-3 VAD implant 02/26/19 - Echo 7/21 EF 40-45%--> repeat this admit 40-45%.  - Volume satus stable - Continue sildenafil - Off coreg and losartan.  MAPs stable 70-80s  3. VAD - Admit 12/21 with multiple low flow alarms. Rising LDH and decreasing PLTs. Suspected outflow graft kink occlusion. Patient placed on his left side and resolved.  - VAD interrogated personally. Parameters stable. - 1/31 Restarted coumadin .  - INR 1.7 today  4. HTN - MAPs ok - Off home Coreg and Losartan  5. Anemia, iron deficient  - Admit Hgb 7.0 - Transfused 1 u RBC 1/23 and 1 u 2/4.  - Hgb 9.4 today. No overt bleeding.    6. Hypokalemia/Hypomagnesemia  - K 3.7 - Mg 1.6>>2.0 today  - Supp K   I reviewed the LVAD parameters from today, and compared the results to the patient's prior recorded data.  No programming changes were made.  The LVAD is functioning within specified parameters.  The patient performs LVAD self-test daily.  LVAD interrogation was negative for any significant power  changes, alarms or PI events/speed drops.  LVAD equipment check completed and is in good working order.  Back-up equipment present.   LVAD education done on emergency procedures and precautions and reviewed exit site care.  Length of Stay: [address] PA-C 03/16/2021, 7:18 AM  VAD Team --- VAD ISSUES ONLY--- Pager [phone] (7am - 7am)  Advanced Heart Failure Team  Pager [phone] (M-F; 7a - 5p)  Please contact [facility] for night-coverage after hours (5p -  7a ) and weekends on [email or web address]  Patient seen and examined with the above-signed Advanced Practice Provider and/or Housestaff. I personally reviewed laboratory data, imaging studies and relevant notes. I independently examined the patient and formulated the important aspects of the plan. I have edited the note to reflect any of my changes or salient points. I have personally discussed the plan with the patient and/or family.  Feels good. Eager to go home. Remains afebrile on IV abx. Wound vac working well. No SOB, orthopnea or PND. INR 1.7 on heparin/warfarin  General:  NAD.  HEENT: normal  Neck: supple. JVP not elevated.  Carotids 2+ bilat; no bruits. No lymphadenopathy or thryomegaly appreciated. Cor: LVAD hum.  Lungs: Clear. Abdomen: soft, nontender, non-distended. No hepatosplenomegaly. No bruits or masses. Good bowel sounds. Driveline site with wound vac Anchor in place.  Extremities: no cyanosis, clubbing, rash. Warm no edema  Neuro: alert & oriented x 3. No focal deficits. Moves all 4 without problem   I think he is stable for d/c today. Switch IV ancef to Linezolid. Will return on Monday for wound vac change in clinic. Discussed warfarin dosing with PharmD personally.  VAD interrogated personally. Parameters stable.  Total time for d/c > 35 mins  Glori Bickers, MD  8:48 PM

## 2021-03-16 NOTE — Progress Notes (Signed)
Mobility Specialist: Progress Note   03/16/21 1124  Mobility  Activity Ambulated independently in hallway  Level of Assistance Independent  Assistive Device None  Distance Ambulated (ft) 800 ft  Activity Response Tolerated well  $Mobility charge 1 Mobility   Received pt in bed having no complaints and agreeable to mobility. Asymptomatic throughout ambulation, returned back to bed w/ call bell in reach and all needs met.  [facility] Mobility Specialist Mobility Specialist 5 North: [phone] Mobility Specialist 6 North: [phone]

## 2021-03-16 NOTE — Plan of Care (Signed)
  Problem: Education: Goal: Patient will understand all VAD equipment and how it functions Outcome: Progressing Goal: Patient will be able to verbalize current INR target range and antiplatelet therapy for discharge home Outcome: Progressing   Problem: Cardiac: Goal: LVAD will function as expected and patient will experience no clinical alarms Outcome: Progressing   Problem: Education: Goal: Knowledge of General Education information will improve Description: Including pain rating scale, medication(s)/side effects and non-pharmacologic comfort measures Outcome: Progressing   Problem: Health Behavior/Discharge Planning: Goal: Ability to manage health-related needs will improve Outcome: Progressing   Problem: Clinical Measurements: Goal: Ability to maintain clinical measurements within normal limits will improve Outcome: Progressing Goal: Will remain free from infection Outcome: Progressing Goal: Diagnostic test results will improve Outcome: Progressing Goal: Respiratory complications will improve Outcome: Progressing Goal: Cardiovascular complication will be avoided Outcome: Progressing   Problem: Activity: Goal: Risk for activity intolerance will decrease Outcome: Progressing   Problem: Nutrition: Goal: Adequate nutrition will be maintained Outcome: Progressing   Problem: Coping: Goal: Level of anxiety will decrease Outcome: Progressing   Problem: Elimination: Goal: Will not experience complications related to bowel motility Outcome: Progressing Goal: Will not experience complications related to urinary retention Outcome: Progressing   Problem: Pain Managment: Goal: General experience of comfort will improve Outcome: Progressing   Problem: Safety: Goal: Ability to remain free from injury will improve Outcome: Progressing   Problem: Skin Integrity: Goal: Risk for impaired skin integrity will decrease Outcome: Progressing   

## 2021-03-16 NOTE — Progress Notes (Signed)
RN went over d/c summary with pt and hooked up pt's home wound vac. NT removed PIV. RN and NT transported pt to [employer]. Pt has belongings, including home meds, wound vac equipment, LVAD equipment, and other personal belongings.

## 2021-03-16 NOTE — Patient Instructions (Addendum)
Return to [place] clinic on Monday 03/19/21 at 11:00 for appointment with Dr Haroldine Laws, and wound vac change with Dr Prescott Gum. Please bring wound vac canister with you.  Please bring all your equipment- battery charger, mobile power unit, batteries, and black back up bag to clinic for annual maintenance.  Please bring all of you medications and your pillbox for VAD coordinator to fill for you at your appointment on Monday Reminder: you are responsible for setting up Medicaid transportation to your VAD clinic appointments if your sister, or niece, is unable to drive you. Remember they need several days heads up, so make sure to call them as soon as you know your appointment dates/times.

## 2021-03-16 NOTE — Progress Notes (Addendum)
[place] for heparin>warfarin Indication:  LVAD  No Known Allergies  Patient Measurements: Height: 5\' 6"  (167.6 cm) Weight: 53.8 kg (118 lb 9.7 oz) IBW/kg (Calculated) : 63.8 Heparin Dosing Weight: 52kg  Vital Signs: Temp: 98.3 F (36.8 C) (02/10 0318) Temp Source: Oral (02/10 0318) BP: 110/97 (02/10 0318) Pulse Rate: 85 (02/10 0318)  Labs: Recent Labs    03/14/21 0132 03/15/21 0435 03/16/21 0050  HGB 9.7* 9.6* 9.4*  HCT 30.8* 29.8* 29.8*  PLT 228 243 251  LABPROT 17.4* 19.1* 19.5*  INR 1.4* 1.6* 1.7*  HEPARINUNFRC <0.10* <0.10* <0.10*  CREATININE 0.72 0.68 0.76     Estimated Creatinine Clearance: 98.1 mL/min (by C-G formula based on SCr of 0.76 mg/dL).   Medical History: Past Medical History:  Diagnosis Date   Dilated cardiomyopathy ([place])    Dilated cardiomyopathy ([place]) 02/2019   Polysubstance abuse ([place])     Assessment: 58 yoM with HM3 LVAD on chronic warfarin admitted with DLI. S/p I&D 1/27, heparin held overnight. Resumed 1/28 am.   Heparin level remains undetectable as expected on low fixed dose heparin at 400 units/hr. Hgb stable at 9.4, plt 251. LDH 195. S/p wound VAC change on 2/6 - back on fixed-dose heparin at 400 units/hr.  INR continues to increase, up to 1.7 today. He is requiring significantly high doses this week. Of note he is still on boost supplements 2-3 per day. I reached out the his outpatient pharmacist that manages his INR. We do not believe he will be able to maintain the boost supplements and diet he is receiving here in the hospital so his po intake may decrease significantly after discharge. He will also be started on zyvox which can increase his sensitivity to warfarin. Taking this into account I would not continue with doses higher than 5mg  at discharge. I will continue with boosted doses while admitted.   Prior to admit dose was 2.5 mg (5 mg x 0.5) every Sun, Tue, Thu; 5 mg (5 mg x 1) all other  days.    Goal of Therapy:  INR goal 2-2.5 Heparin level <0.3 units/ml Monitor platelets by anticoagulation protocol: Yes   Plan:  Will give warfarin 15 mg today Warfarin 5mg  daily at discharge Follow up in clinic 2/13 scheduled Continue daily INR checks while admitted Continue heparin infusion at 400 units/hr while admitted and INR<1.8  Patient should continue zyvox 600mg  bid at discharge until 2/24 then transition to cefadroxil 500mg  bid as suppressive therapy (copay $0)  Erin Hearing PharmD., BCPS Clinical Pharmacist 03/16/2021 7:28 AM

## 2021-03-16 NOTE — TOC Benefit Eligibility Note (Signed)
Patient Product/process development scientist completed.    The patient is currently admitted and upon discharge could be taking Cefadroxil 500 mg Capsules.  The current 30 day co-pay is, $0.00.   The patient is insured through [employer] Medicare Part D   Roland Earl, CPhT Pharmacy Patient Advocate Specialist [facility] Patient Advocate Team Direct Number: [phone]  Fax: [phone]

## 2021-03-19 ENCOUNTER — Encounter (HOSPITAL_COMMUNITY): Payer: Medicaid Other

## 2021-03-19 ENCOUNTER — Telehealth (HOSPITAL_COMMUNITY): Payer: Self-pay | Admitting: *Deleted

## 2021-03-19 NOTE — Telephone Encounter (Signed)
Received call from pt this morning reporting that it is sleeting in [facility] and the roads are icy, therefore his sister is unable to bring him to clinic this morning for his VAD clinic appt with wound vac change. Discussed with pt that I will need to speak with Dr Donata Clay when he will be available to see pt this week for wound vac change, then I would call him back. Pt verbalized understanding.   I spoke with Dr Donata Clay, and he is available to wound vac change either Tuesday or Wednesday this week.  I have attempted to call pt's cell phone 4 times, and all other family member's numbers listed in the chart with no answer. None have working voicemails. Will continue to try to get in touch with pt regarding VAD clinic appt.   Alyce Pagan RN VAD Coordinator  Office: [phone]  24/7 Pager: [phone]

## 2021-03-20 ENCOUNTER — Other Ambulatory Visit (HOSPITAL_COMMUNITY): Payer: Self-pay

## 2021-03-21 ENCOUNTER — Encounter (HOSPITAL_COMMUNITY): Payer: Medicaid Other

## 2021-03-21 ENCOUNTER — Telehealth (HOSPITAL_COMMUNITY): Payer: Self-pay | Admitting: *Deleted

## 2021-03-21 NOTE — Telephone Encounter (Signed)
Spoke with patient about missed clinic appt today. He reports he did not have a ride, but assures me he has scheduled his Medicaid ride for Monday 03/26/21 appt at 11:00. Reminded him to bring pill box, medications, and all VAD equipment for annual maintenance. Advised him to make sure he takes medications as prescribed over the weekend as his pill box was filled Friday of last week, and will be empty for this weekend. He verbalized understanding of all the above.   Alyce Pagan RN VAD Coordinator  Office: [phone]  24/7 Pager: [phone]

## 2021-03-26 ENCOUNTER — Encounter (HOSPITAL_COMMUNITY): Payer: Medicaid Other

## 2021-03-26 ENCOUNTER — Telehealth (HOSPITAL_COMMUNITY): Payer: Self-pay | Admitting: *Deleted

## 2021-03-27 ENCOUNTER — Telehealth (HOSPITAL_COMMUNITY): Payer: Self-pay | Admitting: *Deleted

## 2021-03-27 NOTE — Telephone Encounter (Signed)
Patient called to report his Medicaid transportation did not come and pick him up for 11:00 am appointment in [facility] today. Patient had hospital d/c visit with Dr. Haroldine Laws and wound vac change with Dr. Darcey Nora scheduled. He missed multiple f/u visits last week as well. Dr. Darcey Nora updated.   I asked patient to get INR and CBC drawn today at [facility] (updated orders faxed to include CBC). Patient agreed to get "later this afternoon". Audry Riles, PharmD updated.   Explained to patient importance of wound vac changes and immediate INR/CBC check. Explained it has been 2 weeks since labs or wound has been assessed and wound vac changed. Pt verbalized understanding of same. Stressed importance of answering phone today due to re-scheduling attempts and INR results review and warfarin dosing. Pt verbalized agreement to same.   Patient reports his wound vac is still in place, with no leaks noted. Says drainage has remained unchanged since hospital discharge, denies pain, tenderness, redness, fever or chills.   Patient says he has had no one from [facility] come to his home. He got wound vac supplies supplied before he left hospital. He says his last [facility] agency was Team Nurse (currently [employer]). Called [employer] and left message for return call. Will discuss setting up [place] wound vac changes with this [place] agency per Dr. Darcey Nora.   VAD Coordinator contacted Medicare Transportation and confirmed patient has been calling to set up appointments, but insurance approval was not in time to schedule driver for this morning's appointment. I was unable to reach patient to confirm alternative day later this week. Called with no answer; voice mail "full".  Re-scheduled VAD Clinic appt for next Monday scheduled with Medicare Transportation ride for next Monday 05/31/21 at 11:00 am. Trip# [id number]. I attempted to call patient multiple times throughout day, no answer and unable to leave message (VM full). Will keep  trying.  Zada Girt RN, VAD Coordinator [phone] 24/7 VAD Pager: [phone]

## 2021-03-27 NOTE — Telephone Encounter (Addendum)
Called patient and confirmed [facility] appointment with wound vac change scheduled Monday, 04/02/21 at 11:00 am. Medicaid transportation has been confirmed by VAD Coordinator for that day. Pick up time at 9:20 (with 20 minute before/after window). Pt verbalized understanding and agreement for same.  Asked patient about INR and CBC he was supposed to get done yesterday; he replied, "I forgot". He says he will go tomorrow. Audry Riles, PharmD updated.  Zada Girt RN, [place] Coordinator [phone]

## 2021-03-28 ENCOUNTER — Telehealth (HOSPITAL_COMMUNITY): Payer: Self-pay

## 2021-03-28 ENCOUNTER — Other Ambulatory Visit (HOSPITAL_COMMUNITY): Payer: Self-pay

## 2021-03-28 NOTE — Telephone Encounter (Signed)
Pharmacy Transitions of Care Follow-up Telephone Call  Date of discharge: 03/16/2021  Discharge Diagnosis: on Chronic Warfarin / VAD  How have you been since you were released from the hospital? Patient reports doing well. No questions or concerns at this time.    Medication changes made at discharge:  - START: Warfarin 5 mg daily  Medication changes verified by the patient? Yes (Yes/No)   Medication Accessibility:  [facility] [address]   Was the patient provided with refills on discharged medications? Yes   Have all prescriptions been transferred from [facility] to home pharmacy? Yes   Is the patient able to afford medications? Patient has insurance   Medication Review: WARFARIN - Please verify dosing schedule and ensure they are scheduled to get INR checked at a clinic  Follow-up Appointments:  Specialist Hospital f/u appt confirmed? Cardiology Scheduled on 04/02/2021 @ 11:00.   If their condition worsens, is the pt aware to call PCP or go to the Emergency Dept.? Yes  Final Patient Assessment: Patient reports over all doing well. He admits to taking 1 tablet daily with no missed doses. He reports he has not gotten his INR checked yet though reported his friend is on his way now.  [facility] [address]

## 2021-03-29 ENCOUNTER — Telehealth (HOSPITAL_COMMUNITY): Payer: Self-pay | Admitting: Pharmacist

## 2021-03-29 ENCOUNTER — Ambulatory Visit (HOSPITAL_COMMUNITY): Payer: Self-pay | Admitting: Pharmacist

## 2021-03-29 LAB — POCT INR: INR: 2.6 (ref 2.0–3.0)

## 2021-03-29 NOTE — Telephone Encounter (Signed)
Advanced Heart Failure Patient Advocate Encounter  Prior Authorization for sildenafil has been denied.  Patient did not meet plan criteria for having Primary Pulmonary Hypertension (Group 1 PH) and did not have a documented vasoreactivity test.    Karle Plumber, PharmD, BCPS, BCCP, CPP Heart Failure Clinic Pharmacist [phone]

## 2021-03-29 NOTE — Progress Notes (Signed)
LVAD INR 

## 2021-04-02 ENCOUNTER — Ambulatory Visit (HOSPITAL_COMMUNITY): Payer: Self-pay | Admitting: Pharmacist

## 2021-04-02 ENCOUNTER — Ambulatory Visit (HOSPITAL_COMMUNITY)
Admit: 2021-04-02 | Discharge: 2021-04-02 | Disposition: A | Discharge status: Home / Self Care | Payer: Medicaid Other | Source: Ambulatory Visit | Attending: Family Medicine | Admitting: Family Medicine

## 2021-04-02 ENCOUNTER — Other Ambulatory Visit: Payer: Self-pay | Admitting: Infectious Diseases

## 2021-04-02 ENCOUNTER — Encounter (HOSPITAL_COMMUNITY): Payer: Self-pay

## 2021-04-02 ENCOUNTER — Other Ambulatory Visit: Payer: Self-pay

## 2021-04-02 VITALS — BP 105/63 | HR 89 | Temp 98.3°F | Ht 68.0 in | Wt 120.0 lb

## 2021-04-02 DIAGNOSIS — T827XXA Infection and inflammatory reaction due to other cardiac and vascular devices, implants and grafts, initial encounter: Secondary | ICD-10-CM | POA: Diagnosis not present

## 2021-04-02 DIAGNOSIS — Z95811 Presence of heart assist device: Secondary | ICD-10-CM | POA: Diagnosis not present

## 2021-04-02 DIAGNOSIS — I110 Hypertensive heart disease with heart failure: Secondary | ICD-10-CM | POA: Insufficient documentation

## 2021-04-02 DIAGNOSIS — Z7901 Long term (current) use of anticoagulants: Secondary | ICD-10-CM | POA: Diagnosis not present

## 2021-04-02 DIAGNOSIS — T829XXD Unspecified complication of cardiac and vascular prosthetic device, implant and graft, subsequent encounter: Secondary | ICD-10-CM

## 2021-04-02 DIAGNOSIS — B9561 Methicillin susceptible Staphylococcus aureus infection as the cause of diseases classified elsewhere: Secondary | ICD-10-CM | POA: Insufficient documentation

## 2021-04-02 DIAGNOSIS — I5082 Biventricular heart failure: Secondary | ICD-10-CM | POA: Diagnosis not present

## 2021-04-02 DIAGNOSIS — I5022 Chronic systolic (congestive) heart failure: Secondary | ICD-10-CM | POA: Insufficient documentation

## 2021-04-02 LAB — PROTIME-INR
INR: 4.9 (ref 0.8–1.2)
Prothrombin Time: 45.4 s — ABNORMAL HIGH (ref 11.4–15.2)

## 2021-04-02 LAB — FUNGUS CULTURE WITH STAIN

## 2021-04-02 LAB — BASIC METABOLIC PANEL WITH GFR
Anion gap: 12 (ref 5–15)
BUN: 6 mg/dL (ref 6–20)
CO2: 23 mmol/L (ref 22–32)
Calcium: 8.8 mg/dL — ABNORMAL LOW (ref 8.9–10.3)
Chloride: 107 mmol/L (ref 98–111)
Creatinine, Ser: 0.76 mg/dL (ref 0.61–1.24)
GFR, Estimated: >60 mL/min
Glucose, Bld: 93 mg/dL (ref 70–99)
Potassium: 3.5 mmol/L (ref 3.5–5.1)
Sodium: 142 mmol/L (ref 135–145)

## 2021-04-02 LAB — LACTATE DEHYDROGENASE: LDH: 187 U/L (ref 98–192)

## 2021-04-02 LAB — FUNGAL ORGANISM REFLEX

## 2021-04-02 LAB — FUNGUS CULTURE RESULT

## 2021-04-02 MED ORDER — ZINC SULFATE 220 (50 ZN) MG PO TABS: 220.0000 mg | ORAL_TABLET | Freq: Every day | ORAL | 3 refills | Status: DC

## 2021-04-02 MED ORDER — LINEZOLID 600 MG PO TABS: 600.0000 mg | ORAL_TABLET | Freq: Two times a day (BID) | ORAL | 3 refills | Status: DC

## 2021-04-02 MED ORDER — POTASSIUM CHLORIDE CRYS ER 20 MEQ PO TBCR: 40.0000 meq | EXTENDED_RELEASE_TABLET | Freq: Every day | ORAL | 3 refills | Status: DC

## 2021-04-02 MED ORDER — LINEZOLID 600 MG PO TABS: 600.0000 mg | ORAL_TABLET | Freq: Two times a day (BID) | ORAL | 1 refills | Status: DC

## 2021-04-02 MED ORDER — CEFADROXIL 1 G PO TABS: 1.0000 g | ORAL_TABLET | Freq: Two times a day (BID) | ORAL | 11 refills | Status: DC

## 2021-04-02 MED ORDER — WARFARIN SODIUM 5 MG PO TABS: 5.0000 mg | ORAL_TABLET | Freq: Every day | ORAL | 3 refills | Status: DC

## 2021-04-02 NOTE — Progress Notes (Signed)
Updated from LVAD team - will start cefadroxil 1 gm BID next week when they see him back in LVAD clinic to refill pill box.  Recent PLT count > 220 from last week.   Would not continue the linzeolid longer than 4 weeks given increased concern for toxic s/e with increased duration of use.   Rexene Alberts, MSN, NP-C [facility]  [email or web address] Pager: [phone] Office: [phone] [facility] Main Line: [phone] *Secure Chat Communication Welcome

## 2021-04-02 NOTE — Patient Instructions (Addendum)
Pill box filled for week except for warfarin - INR pending. Take all medications. Return in one week for dressing change. Call VAD pager if any fever, chills, or tenderness at DL exit site. Lauren will call you with INR results and warfarin dosing. Hold warfarin for now.

## 2021-04-02 NOTE — Progress Notes (Signed)
VAD CLINIC NOTE  HPI:  [name] is a 36 y/o male from [place]  w/ h/o severe systolic HF due to NICM EF 15% and prior polysubstance abuse (cocaine and ETOH)   Underwent HM-3 LVAD placement on 02/26/19 by Dr. Nils Pyle. Required milrinone post op for RV support.  Admitted 6/21 for AKI and hypotension in setting of volume depletion due to GI bug.Had multiple flow alarms. Echo EF 40-45%   Admitted 12/21 for anemia in setting of supratherapeutic INR (8.0) and bleeding from a sore on his lip. Transfused but did not bleed any further. Developed refractory low flow alarms on VAD with drop in platelets and rising LDH. RUQ U/S was normal (doubt splenic sequestration). Peripheral smear showed no evidence of hemolysis.Patient placed on his left side and low flow alarms resolved. It was suspected possible obstruction/clot of outflow graft causing Plts and Hgb to drop. CT 12/10 showed no evidence of outflow graft kink/clot.  LDH trended back down to his baseline, below 200s. Plts started to trend back up.  Admitted 3/22 for MSSA DL infection requiring surgical debridement. D/c'd to [facility] for IV abx.    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 Wound cx grew MSSA. Treated with cefazolin. Discharged home on 03/16/21 with wound vac. ID recommended oral linezolid BID for 3 weeks at discharge then switch to chronic cefadroxil afterwards.  Here for routine f/u. Has been at home. Wound vac not working. Says he doesn;t know how long it has been like this. Site looks irritated. Had roach crawl out of his battery bag. Denies orthopnea or PND. No fevers, chills. No bleeding, melena or neuro symptoms. No VAD alarms. Taking all meds as prescribed.    VAD Indication: Destination Therapy LVAD due to narcotic use   LVAD assessment: HM III: Speed:  5400 rpms                                 Flow: 3.4                                    Power: 4.0w                                               PI: 6.5                                                Alarms: 2 pump off alarms (see note above) Events: 44 PI events yesterday  Fixed speed: 5400 Low speed limit: 5100   Primary Controller: Replace back up battery in 32 months Back up controller:   Replace back up battery in 6 months - replaced back up battery with SW [id number]; exp 11/14/22.   I reviewed the LVAD parameters from today and compared the results to the patient's prior recorded data. LVAD interrogation was NEGATIVE for significant power changes, POSITIVE for clinical alarms and POSITIVE for PI events/speed drops. No programming changes were made and pump is functioning within specified parameters. Pt is performing daily controller and  system monitor self tests along with completing weekly and monthly maintenance for LVAD equipment.   LVAD equipment check completed and is in good working order. Back-up equipment present.    Annual Equipment Maintenance on UBC/PM was performed today 04/02/21.   Past Medical History:  Diagnosis Date   Dilated cardiomyopathy ([place])    Dilated cardiomyopathy ([place]) 02/2019   Polysubstance abuse ([facility])     Current Outpatient Medications  Medication Sig Dispense Refill   sildenafil (REVATIO) 20 MG tablet Take 1 tablet (20 mg total) by mouth 3 (three) times daily. 90 tablet 11   linezolid (ZYVOX) 600 MG tablet Take 1 tablet (600 mg total) by mouth 2 (two) times daily. 60 tablet 1   potassium chloride SA (KLOR-CON M20) 20 MEQ tablet Take 2 tablets (40 meq) by mouth daily, or as directed by heart failure clinic 180 tablet 3   warfarin (COUMADIN) 5 MG tablet Take 1 tablet (5 mg total) by mouth daily. 120 tablet 3   Zinc Sulfate 220 (50 Zn) MG TABS Take 1 tablet (220 mg total) by mouth daily. 90 tablet 3   No current facility-administered medications for this encounter.    Patient has no known allergies.   Vital Signs:  Temp: 98.3 Doppler Pressure: 80 Automatc BP:  105/63 (79) HR:  86 SPO2: 99% on RA   Weight: 118.6 lb w/o eqt Last clinic weight: 120 lb w/o eqt  Physical Exam: General:  Thin male. NAD.  HEENT: normal  Neck: supple. JVP not elevated.  Carotids 2+ bilat; no bruits. No lymphadenopathy or thryomegaly appreciated. Cor: LVAD hum.  Lungs: Clear. Abdomen: soft, nontender, non-distended. No hepatosplenomegaly. No bruits or masses. Good bowel sounds. Driveline site with wound vac in place. Sponge missing. VAC not on. Yellow, follow smelling drainage. Site itself relatively intact except for mild skin breakdown and some bloody drainage No tunneling  Extremities: no cyanosis, clubbing, rash. Warm no edema  Neuro: alert & oriented x 3. No focal deficits. Moves all 4 without problem    ASSESSMENT AND PLAN:   1. Recurrent MSSA DL infection - Initial episode 3/22 - Recurrent infection 2/23. Now with wound vac in place but unable to care for it well. Wound vac not working.  - Wound site looks irritated today. Site care provided by VAD team. Stressed need for better hygiene. We will see weekly. May need SNF if we cannot control.  D/w Dr. Prescott Gum - ID recommended oral linezolid BID for 3 weeks at discharge then switch to chronic cefadroxil afterwards.  2. Chronic systolic HF due to severe NICM - Echo EF 10% with biventricular failure - HM-3 VAD implant 02/26/19 - Echo 7/21 EF 40-45% - Stable NYHA I. Volume status ok - Has had problems with med compliance. Will stop sildenafil - MAPs ok  3. VAD - Admit 12/21 with multiple low flow alarms. Rising LDH and decreasing PLTs. Suspected outflow graft kink occlusion. Patient placed on his left side and resolved.  - VAD interrogated personally. Parameters stable. - Now with recurrent DL infection. Plan as above. I worry that he does not have the resources or wherewithal to take care of site satisfactorily. Will see weekly. May need SNF - INR 4.9. INRs have been very labile ranging 1-9 depending on compliance and  nutritional status. Discussed with VAD RN and PharmD and we all feel that the safest plan is to  stop warfarin entirely - MAPs ok  4. HTN - MAPs ok    Total time spent  60 minutes. Over half that time spent discussing above.    Glori Bickers, MD  12:44 PM

## 2021-04-02 NOTE — Progress Notes (Signed)
LVAD INR:  After discussion with Dr. Gala Romney and LVAD coordinators, we believe risk outweighs benefits for continuing anticoagulation with warfarin given patient's inability to take medication as prescribed. Will stop warfarin. Medication list updated.   Karle Plumber, PharmD, BCPS, BCCP, CPP Heart Failure Clinic Pharmacist [phone]

## 2021-04-02 NOTE — Progress Notes (Addendum)
Patient presents for hospital f/u visit for wound vac change in VAD clinic today alone. Patient has missed two previous appointments. One due to "weather" and second due to "my transportation didn't show up" per patient. Reports no problems with VAD equipment or concerns with drive line.  VAD history reveals two pump stops; one on 02/26/21 from 11:10:48 - 11:11:37. Patient explains he changed his controller following clinic visit after his back up battery was charged at clinic visit. Explained he should never do this again. Charging back up battery for back up controller is done q 6 mos as recommended by device manufacturer. Pt verbalized understanding of same.  Second pump stop occurred 04/01/21 from 02:05:44 - [id number] when he and his family heard "beeping and we all got scared". Explained he should never change controller without first calling VAD pager for instructions. Pt verbalized understanding of same.   Patient presents carrying wound vac, it is not on, no drainage noted in canister. Pt says his nephew unplugged to use outlet for his Gameboy. Patient did not bring electrical cord to clinic. He also did not bring any wound vac supplies for dressing change today. Dr. Haroldine Laws, VanTr[place], and Janene Madeira, ID updated. See dressing change note below.    Patient presents with home meds for review and pill box. VAD Coordinator filled pill box except for warfarin. INR pending; told patient not to take warfarin unless instructed by Audry Riles, PharmD. Asked patient again, to answer his phone when Lauren or VAD Coordinator attempts to call him as we are unable to leave voicemail since he does not have VM set up. Pt verbalized agreement to same.   INR 4.9 today; discussed with Dr. Haroldine Laws and Audry Riles, PharmD. Will stop warfarin due to history of non therapeutic INR results. Updated Dr. Haroldine Laws that patient will no longer be eligible for insurance coverage for Sildenafil per Audry Riles, PharmD.    Stressed importance of weekly clinic visits for dressing change; patient says he will arrange his transportation and verbalized agreement to same.   Patient brought home equipment for annual maintenance. Equipment found to have large roaches and roach eggs in same. Cleaned per VAD Coordinator and [employer]. Explained finding roaches and eggs in equipment to patient, he says "we don't have roaches, we have water bugs". Explained importance of cleanliness for wound healing as well as for 39 month old baby living in his home. Provided bleach wipes and gloves with instructions to clean home.   Vital Signs:  Temp: 98.3 Doppler Pressure: 80 Automatc BP:  105/63 (79) HR: 86 SPO2: 99% on RA   Weight: 118.6 lb w/o eqt Last clinic weight: 120 lb w/o eqt   VAD Indication: Destination Therapy LVAD due to narcotic use   LVAD assessment: HM III: Speed:  5400 rpms                                 Flow: 3.4      Power: 4.0w                                              PI: 6.5  Alarms: 2 pump off alarms (see note above) Events: 44 PI events yesterday  Fixed speed: 5400 Low speed limit: 5100  Primary Controller: Replace back up battery in 32 months Back up controller:   Replace back up battery in 6 months - replaced back up battery with SW [id number]; exp 11/14/22.   I reviewed the LVAD parameters from today and compared the results to the patient's prior recorded data. LVAD interrogation was NEGATIVE for significant power changes, POSITIVE for clinical alarms and POSITIVE for PI events/speed drops. No programming changes were made and pump is functioning within specified parameters. Pt is performing daily controller and system monitor self tests along with completing weekly and monthly maintenance for LVAD equipment.   LVAD equipment check completed and is in good working order. Back-up equipment present.   Annual Equipment Maintenance on UBC/PM was  performed today 04/02/21.  Exit Site Care: Existing wound vac outer dressing attached to cannister, but not on. No drainage noted in cannister. Gray sponge missing, patient reports it "fell out" then he placed silk tape over entire outer dressing. Yellow drainage seeping from bottom of dressing with foul odor. Abdominal binder on covered with DL drainage; removed and placed in trash. Sorbact sutured in place, removed. Site care performed using sterile technique. Drive line exit site cleaned with Chlora prep applicator x 2; rinsed with sterile saline, allowed to dry, and site packed with silver strip. Exit site not incorporated, no tunneling noted. Tissue under DL pale with pus drainage. The velour is exposed @ 3 cm. Pt denies fever or chills. Will perform weekly dressing changes in clinic.  Provided patient with 6 daily dressing kits, silver strips, and 6 anchors for home Home Health use if we can set up per Dr. Haroldine Laws.     Device: N/A  BP & Labs:  Doppler BP 80 - Doppler is reflecting MAP   Hgb 9.7 - No S/S of bleeding. Specifically denies melena/BRBPR or nosebleeds.   LDH 187 with established baseline of 250 - 430. Denies tea-colored urine. No power elevations noted on interrogation.     Batteries Manufacture Date: Number of uses: Re-calibration  10/16/18 173 - 196 Has not been performed   Annual maintenance completed per [employer] on patients home MPU and universal Char[employer] fundraiser.    Backup system controller 11 volt battery charged during visit.  Demonstrated how and when to calibrate batteries. Patient verbalized understanding of same and will perform on batteries at home.     Patient Instructions: Pill box filled for week except for warfarin - INR pending. Take all medications. Return in one week for dressing change. Call VAD pager if any fever, chills, or tenderness at DL exit site. Lauren will call you with INR results and warfarin dosing. Hold warfarin for now.    Zada Girt RN [place] Coordinator  Office: [phone]  24/7 Pager: [phone]

## 2021-04-03 ENCOUNTER — Telehealth (INDEPENDENT_AMBULATORY_CARE_PROVIDER_SITE_OTHER): Payer: Medicaid - Out of State | Admitting: Infectious Diseases

## 2021-04-03 DIAGNOSIS — T827XXA Infection and inflammatory reaction due to other cardiac and vascular devices, implants and grafts, initial encounter: Secondary | ICD-10-CM | POA: Diagnosis not present

## 2021-04-03 NOTE — Telephone Encounter (Signed)
Updated from LVAD team on patient's condition as it relates to chronic driveline infection s/p debridement - will start cefadroxil 1 gm BID next week when they see him back in LVAD clinic to refill pill box.  Recent PLT count > 220 from last week.    Would not continue the linzeolid longer than 4 weeks given increased concern for toxic s/e with increased duration of use.   He will return for regular follow up with LVAD team.    Rexene Alberts, MSN, NP-C [facility]  [email or web address] Pager: [phone] Office: [phone] [facility] Main Line: [phone] *Secure Chat Communication Welcome

## 2021-04-06 ENCOUNTER — Other Ambulatory Visit (HOSPITAL_COMMUNITY): Payer: Self-pay | Admitting: *Deleted

## 2021-04-06 DIAGNOSIS — I5022 Chronic systolic (congestive) heart failure: Secondary | ICD-10-CM

## 2021-04-06 DIAGNOSIS — Z95811 Presence of heart assist device: Secondary | ICD-10-CM

## 2021-04-09 ENCOUNTER — Ambulatory Visit (HOSPITAL_COMMUNITY)
Admission: RE | Admit: 2021-04-09 | Discharge: 2021-04-09 | Disposition: A | Discharge status: Home / Self Care | Payer: Medicaid Other | Source: Ambulatory Visit | Attending: Cardiology | Admitting: Cardiology

## 2021-04-09 ENCOUNTER — Other Ambulatory Visit: Payer: Self-pay

## 2021-04-09 ENCOUNTER — Other Ambulatory Visit (HOSPITAL_COMMUNITY): Payer: Medicaid Other

## 2021-04-09 DIAGNOSIS — T827XXA Infection and inflammatory reaction due to other cardiac and vascular devices, implants and grafts, initial encounter: Secondary | ICD-10-CM | POA: Diagnosis not present

## 2021-04-09 DIAGNOSIS — I5022 Chronic systolic (congestive) heart failure: Secondary | ICD-10-CM | POA: Diagnosis not present

## 2021-04-09 DIAGNOSIS — Z95811 Presence of heart assist device: Secondary | ICD-10-CM | POA: Diagnosis not present

## 2021-04-09 DIAGNOSIS — Z4801 Encounter for change or removal of surgical wound dressing: Secondary | ICD-10-CM | POA: Insufficient documentation

## 2021-04-09 DIAGNOSIS — Z7901 Long term (current) use of anticoagulants: Secondary | ICD-10-CM | POA: Insufficient documentation

## 2021-04-09 DIAGNOSIS — Y828 Other medical devices associated with adverse incidents: Secondary | ICD-10-CM | POA: Diagnosis not present

## 2021-04-09 LAB — CBC
HCT: 30 % — ABNORMAL LOW (ref 39.0–52.0)
Hemoglobin: 9.6 g/dL — ABNORMAL LOW (ref 13.0–17.0)
MCH: 28.9 pg (ref 26.0–34.0)
MCHC: 32 g/dL (ref 30.0–36.0)
MCV: 90.4 fL (ref 80.0–100.0)
Platelets: UNDETERMINED 10*3/uL (ref 150–400)
RBC: 3.32 MIL/uL — ABNORMAL LOW (ref 4.22–5.81)
RDW: 17.7 % — ABNORMAL HIGH (ref 11.5–15.5)
WBC: 4.1 10*3/uL (ref 4.0–10.5)
nRBC: 0 % (ref 0.0–0.2)

## 2021-04-09 LAB — LACTATE DEHYDROGENASE: LDH: 166 U/L (ref 98–192)

## 2021-04-09 MED ORDER — ZINC SULFATE 220 (50 ZN) MG PO TABS: 220.0000 mg | ORAL_TABLET | Freq: Every day | ORAL | 3 refills | Status: DC

## 2021-04-09 MED ORDER — POTASSIUM CHLORIDE CRYS ER 20 MEQ PO TBCR: 40.0000 meq | EXTENDED_RELEASE_TABLET | Freq: Every day | ORAL | 3 refills | Status: DC

## 2021-04-09 NOTE — Patient Instructions (Signed)
Please make sure to pick up your new antibiotic (Cefadroxil) from the pharmacy today. You will take this twice a day. Place medication in morning and evening spots on your pill box for the entire week ?I sent in refills for your Zinc and Potassium for you to pick up when you pick up your antibiotic. ?Return to VAD clinic in 1 week for labs and dressing change ?If you have any issues picking up your antibiotic you need to call VAD coordinators immediately. VAD office- [phone] or VAD pager- [phone] ?

## 2021-04-09 NOTE — Progress Notes (Signed)
Patient presents for labs and dressing change in VAD clinic today alone.  Reports no problems with VAD equipment or concerns with drive line. ?   ?Patient presents with home meds for review and pill box. VAD Coordinator filled pill box except for Cefadroxil. Pt instructed to pick up Cefadroxil today from [facility]. Instructed to take twice a day, and to place it in morning and evening slots of pill box. He verbalized understanding, and assures me he will pick up medication today. VAD coordinator removed Sildenafil and Linezolid from patient's medication bag to today as he is no longer taking either medication.  ? ?[facility] Pot[facility]. Instructed patient to pick up these refills when he picks up Cefadroxil. He verbalized understanding. Instructed patient to call VAD coordinator immediately if he has difficulty picking up antibiotic and other medications today. He verbalized understanding. Provided patient with both VAD office and pager phone numbers.  ? ?Stressed importance of weekly clinic visits for dressing change; patient says he will arrange his transportation for next week when he leaves here today.  ? ?Exit Site Care: ?Existing VAD dressing intact. Site care performed using sterile technique. Drive line exit site cleaned with Chlora prep applicator x 2; rinsed with sterile saline, allowed to dry, and silver strip with gauze dressing secured with silk tape applied. Exit site partially incorporated, no tunneling noted. Tissue under and surrounding drive line pale pink. Tissue bleeding noted after cleansing- silver nitrate to oozy area after cleansing with hemostasis achieved. DL The velour is exposed @ 3 cm. Small amount of yellow-brown drainage noted on silver strip and gauze. No redness, tenderness, foul odor or rash noted. Drive line anchor re-applied. Pt denies fever or chills.  ? ? ? ? ?Patient Instructions: ?Please make sure to pick up your new antibiotic (Cefadroxil) from the  pharmacy today. You will take this twice a day. Place medication in morning and evening spots on your pill box for the entire week ?I sent in refills for your Zinc and Potassium for you to pick up when you pick up your antibiotic. ?Return to [place] clinic in 1 week for labs and dressing change ?If you have any issues picking up your antibiotic you need to call VAD coordinators immediately. VAD office- [phone] or VAD pager- [phone] ? ?Emerson Monte RN ?VAD Coordinator  ?Office: [phone]  ?24/7 Pager: [phone]  ? ? ? ? ? ? ? ?

## 2021-04-10 ENCOUNTER — Other Ambulatory Visit (HOSPITAL_COMMUNITY): Payer: Self-pay | Admitting: *Deleted

## 2021-04-10 NOTE — Telephone Encounter (Signed)
Called and spoke with patient to make sure he was able to pick up Cefadroxil. Reports pharmacy told him they do not have medication and "are processing" his prescription. ? ?I called and personally spoke with [facility]. He informed me they do not carry Cefadroxil 1000 mg tablets, but would be able to substitute 500 mg capsule. I discussed this with Rexene Alberts NP, and received ok to change formulary. Order given to pharmacy team member for Cefadroxil 500 mg capsules- pt to take two capsules (1000 mg) BID. Capsule # 120. Refill 3. Per team member prescription will be available to pick up this evening.  ? ?Called and spoke with [name] about the above. Made aware of prescription change, and instructed to place 2 tablets in both the morning and evening slots on his pill box for the remainder of the week. He verbalized understanding. Instructed to bring all medications to clinic next week so VAD coordinator can fill pill box. He verbalized understanding.  ? ?Alyce Pagan RN ?VAD Coordinator  ?Office: [phone]  ?24/7 Pager: [phone]  ? ?

## 2021-04-13 ENCOUNTER — Other Ambulatory Visit (HOSPITAL_COMMUNITY): Payer: Self-pay | Admitting: Unknown Physician Specialty

## 2021-04-16 ENCOUNTER — Other Ambulatory Visit: Payer: Self-pay

## 2021-04-16 ENCOUNTER — Ambulatory Visit (HOSPITAL_COMMUNITY)
Admission: RE | Admit: 2021-04-16 | Discharge: 2021-04-16 | Disposition: A | Discharge status: Home / Self Care | Payer: Medicaid Other | Source: Ambulatory Visit | Attending: Internal Medicine | Admitting: Internal Medicine

## 2021-04-16 DIAGNOSIS — T827XXA Infection and inflammatory reaction due to other cardiac and vascular devices, implants and grafts, initial encounter: Secondary | ICD-10-CM

## 2021-04-16 DIAGNOSIS — Z4801 Encounter for change or removal of surgical wound dressing: Secondary | ICD-10-CM | POA: Diagnosis present

## 2021-04-16 NOTE — Addendum Note (Signed)
Encounter addended by: Louann Liv, LCSW on: 04/16/2021 11:24 AM ? Actions taken: Clinical Note Signed

## 2021-04-16 NOTE — Progress Notes (Signed)
CSW met with patient in the clinic. Patient reports he is doing well at home and denies any concerns. Patient reports he was able to use Medicaid transportation with no concerns. CSW available as needed. [name], [place], [place] ? ?

## 2021-04-16 NOTE — Progress Notes (Signed)
Patient presents for dressing change in VAD clinic today alone.  Reports no problems with VAD equipment or concerns with drive line. ?   ?Patient presents with home meds for review and pill box. VAD Coordinator filled pill box today. Pt picked up [facility] and started taking it Wednesday. Pt only had enough zinc to fill his pillbox through Wednesday of this week. Pt had 5 refills on the bottle. I instructed that he needed to call in refills for the zinc and the potassium. He verbalized understanding. ? ?Stressed importance of weekly clinic visits for dressing change; patient says he will arrange his transportation for next week when he leaves here today.  ? ?Exit Site Care: ?Existing VAD dressing intact. Site care performed using sterile technique. Drive line exit site cleaned with Chlora prep applicator x 2; rinsed with sterile saline, allowed to dry, and silver strip with gauze dressing secured with silk tape applied. Exit site partially incorporated, no tunneling noted. Tissue under and surrounding drive line pale pink.  DL The velour is exposed @ 3 cm. Moderate amount of brown drainage with slighty odor noted on silver strip and gauze. No redness, tenderness, or rash noted. Drive line anchor re-applied. Pt denies fever or chills.  ? ? ? ? ? ? ?Patient Instructions: ?Return to VAD clinic in 1 week for  and dressing change ? ? ?Carlton Adam RN ?VAD Coordinator  ?Office: [phone]  ?24/7 Pager: [phone]  ? ? ? ? ? ? ? ?

## 2021-04-18 ENCOUNTER — Other Ambulatory Visit (HOSPITAL_COMMUNITY): Payer: Self-pay

## 2021-04-22 LAB — ACID FAST CULTURE WITH REFLEXED SENSITIVITIES (MYCOBACTERIA): Acid Fast Culture: NEGATIVE

## 2021-04-23 ENCOUNTER — Other Ambulatory Visit: Payer: Self-pay

## 2021-04-23 ENCOUNTER — Ambulatory Visit (HOSPITAL_COMMUNITY)
Admission: RE | Admit: 2021-04-23 | Discharge: 2021-04-23 | Disposition: A | Discharge status: Home / Self Care | Payer: Medicaid Other | Source: Ambulatory Visit | Attending: Internal Medicine | Admitting: Internal Medicine

## 2021-04-23 DIAGNOSIS — T827XXA Infection and inflammatory reaction due to other cardiac and vascular devices, implants and grafts, initial encounter: Secondary | ICD-10-CM | POA: Insufficient documentation

## 2021-04-23 DIAGNOSIS — Z95811 Presence of heart assist device: Secondary | ICD-10-CM | POA: Diagnosis not present

## 2021-04-23 DIAGNOSIS — Z7901 Long term (current) use of anticoagulants: Secondary | ICD-10-CM | POA: Diagnosis not present

## 2021-04-23 DIAGNOSIS — I5022 Chronic systolic (congestive) heart failure: Secondary | ICD-10-CM | POA: Diagnosis not present

## 2021-04-23 DIAGNOSIS — Y828 Other medical devices associated with adverse incidents: Secondary | ICD-10-CM | POA: Insufficient documentation

## 2021-04-23 DIAGNOSIS — Z4801 Encounter for change or removal of surgical wound dressing: Secondary | ICD-10-CM | POA: Insufficient documentation

## 2021-04-23 MED ORDER — POTASSIUM CHLORIDE CRYS ER 20 MEQ PO TBCR: 40.0000 meq | EXTENDED_RELEASE_TABLET | Freq: Every day | ORAL | 3 refills | Status: DC

## 2021-04-23 NOTE — Progress Notes (Signed)
Patient presents for dressing change in VAD clinic today alone.  Reports no problems with VAD equipment or concerns with drive line. ?   ?Patient presents with home meds for review and pill box. VAD Coordinator filled pill box today. Pt brought Cefadroxil, but did not have potassium or Zinc. Pt reports he went to pick up potassium, and was told by the pharmacy his request was "processing." I personally called to speak with pharmacy, and left voicemail requesting call back. Made pt aware again that he can [facility]. He verbalized understanding.  ? ?Reports he "found Warfarin" at home and has been taking for the last week. Reminded him that we stopped his Warfarin a few weeks ago, and that he is NOT to take any more doses. Stressed importance of only taking what is in his pillbox filled by the VAD coordinator. He verbalized understanding.  ? ?Pt missed 5 evening doses of Cefadroxil. Reports he is usually busy with his daughter in the evenings, and forgets to take his medication. Stressed importance of taking antibiotic as prescribed. He assures me that he will take all doses from now on.  ? ?Stressed importance of weekly clinic visits for dressing change; patient says he will arrange his transportation for next week when he leaves here today.  ? ?Exit Site Care: ?Existing VAD dressing intact. Site care performed using sterile technique. Drive line exit site cleaned with Chlora prep applicator x 2; rinsed with sterile saline, allowed to dry, and silver strip with gauze dressing secured with silk tape applied. Exit site partially incorporated, no tunneling noted. Tissue under and surrounding drive line pale pink.  The velour is exposed @ 3 cm. Moderate amount of brown drainage with slight foul odor noted on silver strip and gauze. Slight redness around exit site where drainage was sitting on skin.  No tenderness or rash noted. Dry flaky skin noted under previous anchor site. Place silk tape on  skin, and then drive line anchor re-applied over silk tape. Pt denies fever or chills. Pt has adequate dressing supplies at home.  ? ? ? ? ? ? ?Patient Instructions: ?[facility] ?Please pick up Zinc (over the counter) from [facility]. ?Make sure you are taking all doses of your antibiotic- morning and evening!! ?DO NOT take any more doses of Warfarin ?Return to VAD clinic in 1 week for dressing change. Please bring medications and pill box for VAD coordinators to fill.  ? ? ?Alyce Pagan RN ?VAD Coordinator  ?Office: [phone]  ?24/7 Pager: [phone]  ? ? ? ? ? ? ? ? ? ? ? ?

## 2021-04-23 NOTE — Patient Instructions (Addendum)
Please pick [facility]. ?Please pick up Zinc (over the counter) from [facility]. ?Make sure you are taking all doses of your antibiotic- morning and evening!! ?DO NOT take any more doses of Warfarin  ?Return to VAD clinic in 1 week for dressing change. Please bring medications and pill box for VAD coordinators to fill.  ?

## 2021-04-24 ENCOUNTER — Other Ambulatory Visit (HOSPITAL_COMMUNITY): Payer: Self-pay

## 2021-04-30 ENCOUNTER — Ambulatory Visit (HOSPITAL_COMMUNITY)
Admission: RE | Admit: 2021-04-30 | Discharge: 2021-04-30 | Disposition: A | Discharge status: Home / Self Care | Payer: Medicaid Other | Source: Ambulatory Visit | Attending: Cardiology | Admitting: Cardiology

## 2021-04-30 ENCOUNTER — Other Ambulatory Visit: Payer: Self-pay

## 2021-04-30 DIAGNOSIS — Z95811 Presence of heart assist device: Secondary | ICD-10-CM | POA: Diagnosis not present

## 2021-04-30 DIAGNOSIS — Z4801 Encounter for change or removal of surgical wound dressing: Secondary | ICD-10-CM | POA: Insufficient documentation

## 2021-04-30 NOTE — Progress Notes (Signed)
Patient presents for dressing change in VAD clinic today alone.  Reports no problems with VAD equipment or concerns with drive line. ?   ?Patient presents with home meds for review and pill box. VAD Coordinator filled pill box today. Requested pt call in refill for Cefadroxil to his pharmacy, and bring new prescription to clinic next week. He verbalized understanding. Pt did not miss any doses of medications this week.  ? ?Pt reports he changed his own dressing once since clinic last week, due to dressing "falling off." Dressing today is clean, dry, and intact. Anchor is not attached to skin.  ? ?Stressed importance of weekly clinic visits for dressing change; patient says he will arrange his transportation for next week when he leaves here today.  ? ?Exit Site Care: ?Existing VAD dressing intact. Site care performed using sterile technique. Drive line exit site cleaned with Chlora prep applicator x 2; rinsed with sterile saline, allowed to dry, and silver strip with gauze dressing secured with silk tape applied. Exit site partially incorporated, no tunneling noted. Tissue under and surrounding drive line pale pink.  The velour is exposed @ 3 cm. Scant amount of brown drainage with noted on silver strip and gauze. No redness, tenderness, foul odor or rash noted. Dry flaky skin noted under previous anchor site. Place silk tape on skin, and then drive line anchor re-applied over silk tape. Pt denies fever or chills. Pt provided with silk tape, 7 daily kits, and 5 anchors for home use.  ? ? ? ?Patient Instructions: ?Return to VAD clinic in 1 week for dressing change. Please bring medications and pill box for VAD coordinators to fill.  ? ? ?Alyce Pagan RN ?VAD Coordinator  ?Office: [phone]  ?24/7 Pager: [phone]  ? ? ? ? ? ? ? ? ? ? ? ?

## 2021-05-07 ENCOUNTER — Other Ambulatory Visit (HOSPITAL_COMMUNITY): Payer: Medicaid Other

## 2021-05-08 ENCOUNTER — Telehealth (HOSPITAL_COMMUNITY): Payer: Self-pay | Admitting: *Deleted

## 2021-05-08 NOTE — Telephone Encounter (Signed)
I have attempted to contact patient twice about missing his appt for dressing change yesterday. Unable to leave a voicemail due to inbox full. Will send letter requesting him to call and reschedule appt.  ? ?Emerson Monte RN ?VAD Coordinator  ?Office: [phone]  ?24/7 Pager: [phone]  ? ?

## 2021-05-17 ENCOUNTER — Other Ambulatory Visit (HOSPITAL_COMMUNITY): Payer: Medicaid Other

## 2021-05-17 ENCOUNTER — Encounter (HOSPITAL_COMMUNITY): Payer: Self-pay | Admitting: Unknown Physician Specialty

## 2021-05-25 ENCOUNTER — Other Ambulatory Visit (HOSPITAL_COMMUNITY): Payer: Self-pay | Admitting: *Deleted

## 2021-05-25 DIAGNOSIS — Z95811 Presence of heart assist device: Secondary | ICD-10-CM

## 2021-05-25 DIAGNOSIS — I5022 Chronic systolic (congestive) heart failure: Secondary | ICD-10-CM

## 2021-05-25 DIAGNOSIS — Z7901 Long term (current) use of anticoagulants: Secondary | ICD-10-CM

## 2021-05-25 DIAGNOSIS — T827XXA Infection and inflammatory reaction due to other cardiac and vascular devices, implants and grafts, initial encounter: Secondary | ICD-10-CM

## 2021-05-28 ENCOUNTER — Ambulatory Visit (HOSPITAL_COMMUNITY)
Admission: RE | Admit: 2021-05-28 | Discharge: 2021-05-28 | Disposition: A | Discharge status: Home / Self Care | Payer: Medicaid Other | Source: Ambulatory Visit | Attending: Cardiology | Admitting: Cardiology

## 2021-05-28 VITALS — BP 104/0 | HR 99 | Ht 68.0 in | Wt 116.0 lb

## 2021-05-28 DIAGNOSIS — Z95811 Presence of heart assist device: Secondary | ICD-10-CM | POA: Diagnosis not present

## 2021-05-28 DIAGNOSIS — T827XXA Infection and inflammatory reaction due to other cardiac and vascular devices, implants and grafts, initial encounter: Secondary | ICD-10-CM

## 2021-05-28 DIAGNOSIS — I5022 Chronic systolic (congestive) heart failure: Secondary | ICD-10-CM | POA: Diagnosis not present

## 2021-05-28 DIAGNOSIS — I10 Essential (primary) hypertension: Secondary | ICD-10-CM | POA: Diagnosis not present

## 2021-05-28 LAB — COMPREHENSIVE METABOLIC PANEL
ALT: 32 U/L (ref 0–44)
AST: 56 U/L — ABNORMAL HIGH (ref 15–41)
Albumin: 4.2 g/dL (ref 3.5–5.0)
Alkaline Phosphatase: 85 U/L (ref 38–126)
Anion gap: 11 (ref 5–15)
BUN: <5 mg/dL — ABNORMAL LOW (ref 6–20)
CO2: 23 mmol/L (ref 22–32)
Calcium: 9.3 mg/dL (ref 8.9–10.3)
Chloride: 104 mmol/L (ref 98–111)
Creatinine, Ser: 0.96 mg/dL (ref 0.61–1.24)
GFR, Estimated: >60 mL/min (ref >60)
Glucose, Bld: 98 mg/dL (ref 70–99)
Potassium: 4.1 mmol/L (ref 3.5–5.1)
Sodium: 138 mmol/L (ref 135–145)
Total Bilirubin: 1.1 mg/dL (ref 0.3–1.2)
Total Protein: 8.1 g/dL (ref 6.5–8.1)

## 2021-05-28 LAB — CBC
HCT: 34.9 % — ABNORMAL LOW (ref 39.0–52.0)
Hemoglobin: 11 g/dL — ABNORMAL LOW (ref 13.0–17.0)
MCH: 29.4 pg (ref 26.0–34.0)
MCHC: 31.5 g/dL (ref 30.0–36.0)
MCV: 93.3 fL (ref 80.0–100.0)
Platelets: UNDETERMINED 10*3/uL (ref 150–400)
RBC: 3.74 MIL/uL — ABNORMAL LOW (ref 4.22–5.81)
RDW: 21.1 % — ABNORMAL HIGH (ref 11.5–15.5)
WBC: 3.6 10*3/uL — ABNORMAL LOW (ref 4.0–10.5)
nRBC: 0 % (ref 0.0–0.2)

## 2021-05-28 LAB — LACTATE DEHYDROGENASE: LDH: 187 U/L (ref 98–192)

## 2021-05-28 NOTE — Progress Notes (Signed)
Patient presents for 2 mo f/u in VAD clinic today alone. Patient has missed two previous appointments because his medicaid ride called and him told him they did not have a driver. ? ?Pt confirms that he is taking his antibiotics twice daily and he started Zinc. He brought all meds to clinic today.  ? ?Dressing changed in clinic today - see below for full documentation. Pt tells me that he is doing his own dressing change "every few days." I stressed good hand washing and sterile technique he assures me that he is doing this.  ? ? ?Vital Signs:  ?Doppler Pressure: 104 ?Automatc BP:  119/63 (92) ?HR: 99 ?SPO2: 100% on RA ?  ?Weight: 116 lb w/o eqt ?Last clinic weight: 120 lb w/o eqt ?  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  ?LVAD assessment: HM III: ?Speed:  5400 rpms                                 ?Flow: 3.4      ?Power: 4.1w                                              ?PI: 8                                               ?Alarms: none ?Events: 60 daily ? ?Fixed speed: 5400 ?Low speed limit: 5100 ? ?Primary Controller: Replace back up battery in 30 months ?Back up controller:   Replace back up battery in 30 months  ?  ?I reviewed the LVAD parameters from today and compared the results to the patient's prior recorded data. LVAD interrogation was NEGATIVE for significant power changes, POSITIVE for clinical alarms and POSITIVE for PI events/speed drops. No programming changes were made and pump is functioning within specified parameters. Pt is performing daily controller and system monitor self tests along with completing weekly and monthly maintenance for LVAD equipment. ?  ?LVAD equipment check completed and is in good working order. Back-up equipment present.  ? ?Annual Equipment Maintenance on UBC/PM was performed today 04/02/21. ? ?Exit Site Care: ?Existing VAD dressing removed and site care performed using sterile technique. Drive line exit site cleaned with Chlora prep applicators x 2,  allowed to dry, and gauze dressing with Silver strip applied. Exit site healing and incorporated, the velour is exposed around 2" at exit site. Scant dried drainage with foul odor. No redness, tenderness, or rash noted. Drive line anchor re-applied. Pt denies fever or chills. Continue weekly dressing changes using the daily kit. Change more frequent if driveline starts to drain. ? ? ? ? ? ?Device: N/A ? ?BP & Labs:  ?Doppler BP 104 - Doppler is reflecting MAP ?  ?Hgb 11 - No S/S of bleeding. Specifically denies melena/BRBPR or nosebleeds. ?  ?LDH 187 with established baseline of 250 - 430. Denies tea-colored urine. No power elevations noted on interrogation.    ? ?Patient Instructions: ?No change in medications ?Return to clinic in 1 month ? ?Tanda Rockers RN ?VAD Coordinator  ?Office: [phone]  ?24/7 Pager: [phone]  ? ? ? ? ? ?

## 2021-05-28 NOTE — Patient Instructions (Signed)
No change in medications °Return to clinic in 1 month °

## 2021-06-10 NOTE — Progress Notes (Signed)
? ?      VAD CLINIC NOTE ? ?HPI: ? ?[name] is a 36 y/o male from [place]  w/ h/o severe systolic HF due to NICM EF 15% and prior polysubstance abuse (cocaine and ETOH) ?  ?Underwent HM-3 LVAD placement on 02/26/19 by Dr. Nils Pyle. Required milrinone post op for RV support. ? ?Admitted 6/21 for AKI and hypotension in setting of volume depletion due to GI bug.Had multiple flow alarms. Echo EF 40-45%  ? ?Admitted 12/21 for anemia in setting of supratherapeutic INR (8.0) and bleeding from a sore on his lip. Transfused but did not bleed any further. Developed refractory low flow alarms on VAD with drop in platelets and rising LDH. RUQ U/S was normal (doubt splenic sequestration). Peripheral smear showed no evidence of hemolysis.Patient placed on his left side and low flow alarms resolved. It was suspected possible obstruction/clot of outflow graft causing Plts and Hgb to drop. CT 12/10 showed no evidence of outflow graft kink/clot.  LDH trended back down to his baseline, below 200s. Plts started to trend back up. ? ?Admitted 3/22 for MSSA DL infection requiring surgical debridement. D/c'd to [facility] for IV abx.   ? ?Admitted 1/23 for recurrent DL infection requiring I&D and wound vac. Wound cx grew MSSA. Treated with cefazolin. Discharged home on 03/16/21 with wound vac. ID recommended oral linezolid BID for 3 weeks at discharge then switch to chronic cefadroxil afterwards. ? ?Here for routine f/u. Patient has missed two previous appointments because his medicaid ride called and him told him they did not have a driver.Says he is doing well. Reports that he is taking abx 2x/day and also taking zinc. Says DL site looks good. He is changing dressing himself every few days. Denies bleeding, edema, orthopnea or PND.  ?  ?  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  ?LVAD assessment: HM III: ?Speed:  5400 rpms                                 ?Flow: 3.4                                    ?Power:  4.1w                                              ?PI: 8                                               ?Alarms: none ?Events: 60 daily ? ?Fixed speed: 5400 ?Low speed limit: 5100 ?  ?Primary Controller: Replace back up battery in 30 months ?Back up controller:   Replace back up battery in 30 months  ?  ?I reviewed the LVAD parameters from today and compared the results to the patient's prior recorded data. LVAD interrogation was NEGATIVE for significant power changes, POSITIVE for clinical alarms and POSITIVE for PI events/speed drops. No programming changes were made and pump is functioning within specified parameters. Pt is performing daily controller and system monitor self tests along with completing weekly and monthly maintenance for  LVAD equipment. ?  ?LVAD equipment check completed and is in good working order. Back-up equipment present.  ?  ?Annual Equipment Maintenance on UBC/PM was performed 04/02/21. ? ? ?Past Medical History:  ?Diagnosis Date  ? Dilated cardiomyopathy ([place])   ? Dilated cardiomyopathy ([place]) 02/2019  ? Polysubstance abuse ([place])   ? ? ?Current Outpatient Medications  ?Medication Sig Dispense Refill  ? cefadroxil (DURICEF) 1 g tablet Take 1 tablet (1 g total) by mouth 2 (two) times daily. 60 tablet 11  ? potassium chloride SA (KLOR-CON M20) 20 MEQ tablet Take 2 tablets (40 meq) by mouth daily, or as directed by heart failure clinic 180 tablet 3  ? Zinc Sulfate 220 (50 Zn) MG TABS Take 1 tablet (220 mg total) by mouth daily. 90 tablet 3  ? ?No current facility-administered medications for this encounter.  ? ? ?Patient has no known allergies. ? ? ?Vital Signs:  ?Temp: 98.3 ?Doppler Pressure: 80 ?Automatc BP:  105/63 (79) ?HR: 86 ?SPO2: 99% on RA ?  ?Weight: 118.6 lb w/o eqt ?Last clinic weight: 120 lb w/o eqt ? ?Physical Exam: ?General:  Thin male. NAD.  ?HEENT: normal ?Neck: supple. no JVD. Carotids 2+ bilat; no bruits. No lymphadenopathy or thryomegaly appreciated. ?Cor: PMI nondisplaced.  Regular rate & rhythm. No rubs, gallops or murmurs. ?Lungs: clear ?Abdomen: soft, nontender, nondistended. No hepatosplenomegaly. No bruits or masses. Good bowel sounds. ?Extremities: no cyanosis, clubbing, rash, edema ?Neuro: alert & orientedx3, cranial nerves grossly intact. moves all 4 extremities w/o difficulty. Affect pleasant ? ?ASSESSMENT AND PLAN: ?  ?1. Recurrent MSSA DL infection ?- Initial episode 3/22 ?- Recurrent infection 2/23 requiring debridement and wound vac.  ?- Wound site looks umch better today. ?- Continue lifelong cefadroxil ? ?2. Chronic systolic HF due to severe NICM ?- Echo EF 10% with biventricular failure ?- HM-3 VAD implant 02/26/19 ?- Echo 7/21 EF 40-45% ?- Stable NYHA I-II. Volume ok  ?- MAPs ok  ? ?3. VAD ?- Admit 12/21 with multiple low flow alarms. Rising LDH and decreasing PLTs. Suspected outflow graft kink occlusion. Patient placed on his left side and resolved.  ?- VAD interrogated personally. Parameters stable. ?- DL site healed ?- Hgb 11.0  ?- LDH 187 ?- INR not drawn. Will follow at home ? ?4. HTN ?- MAPs ok  ? ? ?Total time spent 40 minutes. Over half that time spent discussing above.  ? ? ?Glori Bickers, MD  ?4:07 PM ? ? ? ? ?

## 2021-06-26 ENCOUNTER — Other Ambulatory Visit (HOSPITAL_COMMUNITY): Payer: Self-pay | Admitting: *Deleted

## 2021-06-26 DIAGNOSIS — I5022 Chronic systolic (congestive) heart failure: Secondary | ICD-10-CM

## 2021-06-26 DIAGNOSIS — Z95811 Presence of heart assist device: Secondary | ICD-10-CM

## 2021-06-27 ENCOUNTER — Ambulatory Visit (HOSPITAL_COMMUNITY)
Admission: RE | Admit: 2021-06-27 | Discharge: 2021-06-27 | Disposition: A | Discharge status: Home / Self Care | Payer: Medicaid Other | Source: Ambulatory Visit | Attending: Cardiology | Admitting: Cardiology

## 2021-06-27 ENCOUNTER — Encounter (HOSPITAL_COMMUNITY): Payer: Self-pay

## 2021-06-27 VITALS — BP 127/75 | HR 81 | Wt 119.0 lb

## 2021-06-27 DIAGNOSIS — Z8619 Personal history of other infectious and parasitic diseases: Secondary | ICD-10-CM | POA: Diagnosis not present

## 2021-06-27 DIAGNOSIS — I110 Hypertensive heart disease with heart failure: Secondary | ICD-10-CM | POA: Diagnosis present

## 2021-06-27 DIAGNOSIS — I5022 Chronic systolic (congestive) heart failure: Secondary | ICD-10-CM | POA: Insufficient documentation

## 2021-06-27 DIAGNOSIS — I10 Essential (primary) hypertension: Secondary | ICD-10-CM | POA: Diagnosis not present

## 2021-06-27 DIAGNOSIS — T827XXA Infection and inflammatory reaction due to other cardiac and vascular devices, implants and grafts, initial encounter: Secondary | ICD-10-CM | POA: Diagnosis not present

## 2021-06-27 DIAGNOSIS — I5082 Biventricular heart failure: Secondary | ICD-10-CM | POA: Insufficient documentation

## 2021-06-27 DIAGNOSIS — Z792 Long term (current) use of antibiotics: Secondary | ICD-10-CM | POA: Diagnosis not present

## 2021-06-27 DIAGNOSIS — I420 Dilated cardiomyopathy: Secondary | ICD-10-CM | POA: Diagnosis not present

## 2021-06-27 DIAGNOSIS — Z95811 Presence of heart assist device: Secondary | ICD-10-CM | POA: Diagnosis not present

## 2021-06-27 LAB — CBC
HCT: 29.8 % — ABNORMAL LOW (ref 39.0–52.0)
Hemoglobin: 9.5 g/dL — ABNORMAL LOW (ref 13.0–17.0)
MCH: 31.3 pg (ref 26.0–34.0)
MCHC: 31.9 g/dL (ref 30.0–36.0)
MCV: 98 fL (ref 80.0–100.0)
Platelets: 131 10*3/uL — ABNORMAL LOW (ref 150–400)
RBC: 3.04 MIL/uL — ABNORMAL LOW (ref 4.22–5.81)
RDW: 19.6 % — ABNORMAL HIGH (ref 11.5–15.5)
WBC: 3.5 10*3/uL — ABNORMAL LOW (ref 4.0–10.5)
nRBC: 0 % (ref 0.0–0.2)

## 2021-06-27 LAB — BASIC METABOLIC PANEL
Anion gap: 6 (ref 5–15)
BUN: 6 mg/dL (ref 6–20)
CO2: 27 mmol/L (ref 22–32)
Calcium: 8.9 mg/dL (ref 8.9–10.3)
Chloride: 106 mmol/L (ref 98–111)
Creatinine, Ser: 0.72 mg/dL (ref 0.61–1.24)
GFR, Estimated: >60 mL/min (ref >60)
Glucose, Bld: 93 mg/dL (ref 70–99)
Potassium: 3.3 mmol/L — ABNORMAL LOW (ref 3.5–5.1)
Sodium: 139 mmol/L (ref 135–145)

## 2021-06-27 LAB — LACTATE DEHYDROGENASE: LDH: 226 U/L — ABNORMAL HIGH (ref 98–192)

## 2021-06-27 NOTE — Addendum Note (Signed)
Encounter addended by: Lezlie Octave, RN on: 06/27/2021 1:18 PM  Actions taken: Clinical Note Signed

## 2021-06-27 NOTE — Progress Notes (Signed)
Patient presents for 1 mo f/u in VAD clinic today alone. Patient denies any issues with his drive line or VAD equipment.  Pt denies any issues and reports he feels "good". He is able to perform all his daily activities without limitations. He denies any SOB, fatigue, abdominal distention, bleeding, or dizziness/lightheadedness.    Pt reports recent exposure to poison ivy with skin break out. Skin healing, sites dry, but remains itchy.   Pt confirms that he is taking his antibiotics twice daily and he started Zinc. He brought all meds to clinic today.   Dressing changed in clinic today - see below for full documentation. Pt tells me that he is doing his own dressing change "every three days." He says he knows to call if he develops any new drainage, redness, tenderness, foul odor, or fever/chills.   Repaired drive line tear near modular cable connection with rescue tape.    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 LVAD interrogation was NEGATIVE for significant power changes, POSITIVE for clinical alarms and POSITIVE for PI events/speed drops. No programming changes were made and pump is functioning within specified parameters. Pt is performing daily controller and system monitor self tests along with  completing weekly and monthly maintenance for LVAD equipment.   LVAD equipment check completed and is in good working order. Back-up equipment present.   Annual Equipment Maintenance on UBC/PM was performed today 04/02/21.  Exit Site Care: Existing VAD dressing removed and site care performed using sterile technique. Drive line exit site cleaned with Chlora prep applicators x 2, allowed to dry, rinsed with sterile saline and gauze dressing with 2 x 2 gauze around exit site applied. Exit site healing and incorporated, the velour is exposed around 2" at exit site. No redness, drainage, tenderness, drainage, or foul odor noted.  Used silk tape to secure dressing, pt reports skin irritation with paper tape and silver strip. Drive line anchor re-applied. Pt denies fever or chills. Continue every three dressing changes using the daily kit. Provided patient with 14 daily kits, 14 anchors, and silk tape.     Device: N/A  BP & Labs:  Doppler BP 104 - Doppler is reflecting MAP   Hgb 9.5 - No S/S of bleeding. Specifically denies melena/BRBPR or nosebleeds.   LDH 226 with established baseline of 250 - 430. Denies tea-colored urine. No power elevations noted on interrogation.     Patient Instructions: No change in medications Return to clinic in 1 month  [place] Coordinator  Office: [phone]  24/7 Pager: [phone]

## 2021-06-27 NOTE — Progress Notes (Signed)
VAD CLINIC NOTE VAD MD: Dr Haroldine Laws   HPI: [name] is a 36 y/o male from [place]  w/ h/o severe systolic HF due to NICM EF 15% and prior polysubstance abuse (cocaine and ETOH), HM3 LVAD, and chronic antibiotics for driveline infection. Of note he is no longer asa or coumadin.    Underwent HM-3 LVAD placement on 02/26/19 by Dr. Nils Pyle. Required milrinone post op for RV support.  Admitted 6/21 for AKI and hypotension in setting of volume depletion due to GI bug.Had multiple flow alarms. Echo EF 40-45%   Admitted 12/21 for anemia in setting of supratherapeutic INR (8.0) and bleeding from a sore on his lip. Transfused but did not bleed any further. Developed refractory low flow alarms on VAD with drop in platelets and rising LDH. RUQ U/S was normal (doubt splenic sequestration). Peripheral smear showed no evidence of hemolysis.Patient placed on his left side and low flow alarms resolved. It was suspected possible obstruction/clot of outflow graft causing Plts and Hgb to drop. CT 12/10 showed no evidence of outflow graft kink/clot.  LDH trended back down to his baseline, below 200s. Plts started to trend back up.  Admitted 3/22 for MSSA DL infection requiring surgical debridement. D/c'd to [facility] for IV abx.    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 Wound cx grew MSSA. Treated with cefazolin. Discharged home on 03/16/21 with wound vac. ID recommended oral linezolid BID for 3 weeks at discharge then switch to chronic cefadroxil afterwards.  Today he returns for VAD/HF follow up.Overall feeling fine. Denies SOB/PND/Orthopnea. Appetite ok. No fever or chills. He does not weigh at home. No bleeding issues. He is changing his driveline dressing. Taking all medications.    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                                             PI: 8                                               Alarms: none Events: ~ 80 per day Fixed speed: 5400 Low speed limit: 5100   Primary Controller: Replace back up battery  Back up controller:   Replace back up battery    I reviewed the LVAD parameters from today and compared the results to the patient's prior recorded data. LVAD interrogation was NEGATIVE for significant power changes, POSITIVE for clinical alarms and POSITIVE for PI events/speed drops. No programming changes were made and pump is functioning within specified parameters. Pt is performing daily controller and system monitor self tests along with completing weekly and monthly maintenance for LVAD equipment.  LVAD equipment check completed and is in good working order. Back-up equipment present.    Annual Equipment Maintenance on UBC/PM was performed 04/02/21.   Past Medical History:  Diagnosis Date   Dilated cardiomyopathy ([place])    Dilated cardiomyopathy ([place]) 02/2019   Polysubstance abuse ([place])     Current Outpatient Medications  Medication Sig Dispense Refill   cefadroxil (DURICEF) 1 g tablet Take 1 tablet (1 g total) by mouth 2 (two) times daily. 60 tablet 11   potassium chloride SA (KLOR-CON M20) 20 MEQ tablet Take 2 tablets (40 meq) by mouth daily, or as directed by heart failure clinic 180 tablet 3   Zinc Sulfate 220 (50 Zn) MG TABS Take 1 tablet (220 mg total) by mouth daily. 90 tablet 3   No current facility-administered medications for this encounter.    Patient has no known allergies.  Vitals:   06/27/21 1051  BP: 127/75  Pulse: 81  SpO2: 100%   Wt Readings from Last 3 Encounters:  06/27/21 54 kg (119 lb)  05/28/21 52.6 kg (116 lb)  04/02/21 54.4 kg (120 lb)     Physical Exam: GENERAL: No acute distress. HEENT: normal  NECK: Supple, JVP  flat .  2+ bilaterally, no bruits.  No lymphadenopathy or thyromegaly  appreciated.   CARDIAC:  Mechanical heart sounds with LVAD hum present.  LUNGS:  Clear to auscultation bilaterally.  ABDOMEN:  Soft, round, nontender, positive bowel sounds x4.     LVAD exit site:  Dressing dry and intact.  No erythema or drainage.  Stabilization device present and accurately applied.  Driveline dressing is being changed daily per sterile technique. EXTREMITIES:  Warm and dry, no cyanosis, clubbing, rash or edema  NEUROLOGIC:  Alert and oriented x 3.    No aphasia.  No dysarthria.  Affect pleasant.      ASSESSMENT AND PLAN:   1. Recurrent MSSA DL infection - Initial episode 3/22 - Recurrent infection 2/23 requiring debridement and wound vac.  - Driveline with minimal drainage. Stable.  - Continue lifelong cefadroxil  2. Chronic systolic HF due to severe NICM - Echo EF 10% with biventricular failure - HM-3 VAD implant 02/26/19 - Echo 7/21 EF 40-45% - Stable NYHA I - Volume status stable. He does not require diuretics.  - MAPs ok   3. VAD - Admit 12/21 with multiple low flow alarms. Rising LDH and decreasing PLTs. Suspected outflow graft kink occlusion. Patient placed on his left side and resolved.  He is no longer on aspirin or coumadin to difficulty with INR management/medications issues.  - VAD interrogated personally. Parameters stable. - check LDH   - VAD parameters ok. No power spikes.   4. HTN - MAPs ok   Check CBC, BMET today   Need to limit medications and keep it simple. F/U in 2 months.    Darrick Grinder, NP  10:53 AM

## 2021-06-27 NOTE — Addendum Note (Signed)
Encounter addended by: Lezlie Octave, RN on: 06/27/2021 11:14 AM  Actions taken: Clinical Note Signed

## 2021-06-27 NOTE — Addendum Note (Signed)
Encounter addended by: Louann Liv, LCSW on: 06/27/2021 11:54 AM  Actions taken: Flowsheet accepted, Clinical Note Signed

## 2021-06-27 NOTE — Progress Notes (Signed)
Heart and Vascular Care Navigation  06/27/2021  Jon Martin 29-Aug-1985 [id number]  Reason for Referral: Patient seen in [facility].   Engaged with patient face to face for follow up visit for Heart and Vascular Care Coordination.     Patient is participating in a Managed Medicaid Plan:  Yes                                                                                              Assessment:   Patient well known to [place]. Met with patient in the clinic who states all is going well at home. Patient continues to live at home with his brothers who assist with food prep as need. Patient states he has been managing hi VAD well and is motivated by avoiding another hospitalization.   He was very talkiative and in good spirits.                                HRT/VAS Care Coordination     Patients Home Cardiology Office Heart Failure Clinic  VAD Patient   Outpatient Care Team Social Worker   Social Worker Name: Jon Martin, [place] [phone]   Living arrangements for the past 2 months Single Family Home   Lives with: Siblings   Patient Current Insurance Coverage Medicaid   Patient Has Concern With Paying Medical Bills No   Does Patient Have Prescription Coverage? Yes   Home Assistive Devices/Equipment Other (Comment)  LVAD Patient   DME Agency NA   [place] --  [facility]   Current home services DME  LVAD       Social History:                                                                             SDOH Screenings   Alcohol Screen: Not on file  Depression (PHQ2-9): Not on file  Financial Resource Strain: Medium Risk   Difficulty of Paying Living Expenses: Somewhat hard  Food Insecurity: No Food Insecurity   Worried About Running Out of Food in the Last Year: Never true   Ran Out of Food in the Last Year: Never true  Housing: Low Risk    Last Housing Risk Score: 0  Physical Activity: Not on file  Social Connections: Not on file  Stress: Not on file  Tobacco  Use: Medium Risk   Smoking Tobacco Use: Former   Smokeless Tobacco Use: Never   Passive Exposure: Not on file  Transportation Needs: No Transportation Needs   Lack of Transportation (Medical): No   Lack of Transportation (Non-Medical): No    SDOH Interventions: Financial Resources:  Sales promotion account executive Interventions: Intervention Not Indicated   Food Insecurity:  Food Insecurity Interventions: Intervention Not Indicated  Housing Insecurity:  Housing Interventions: Intervention  Not Indicated  Transportation:   Transportation Interventions: Payor Benefit    Follow-up plan:  Patient appeared in good spirits today in clinic and denies any concerns at this time. Patient sets up his own transportation via medicaid plan. CSW continues to follow as needed. Jon Martin, [place], [place]

## 2021-06-27 NOTE — Patient Instructions (Signed)
No change in medications. Return to VAD Clinic in 1 month.

## 2021-07-20 ENCOUNTER — Other Ambulatory Visit (HOSPITAL_COMMUNITY): Payer: Self-pay | Admitting: Cardiology

## 2021-07-20 DIAGNOSIS — Z7901 Long term (current) use of anticoagulants: Secondary | ICD-10-CM

## 2021-07-20 DIAGNOSIS — T827XXA Infection and inflammatory reaction due to other cardiac and vascular devices, implants and grafts, initial encounter: Secondary | ICD-10-CM

## 2021-07-20 DIAGNOSIS — I5022 Chronic systolic (congestive) heart failure: Secondary | ICD-10-CM

## 2021-07-20 DIAGNOSIS — Z95811 Presence of heart assist device: Secondary | ICD-10-CM

## 2021-08-01 ENCOUNTER — Telehealth (HOSPITAL_COMMUNITY): Payer: Self-pay | Admitting: *Deleted

## 2021-08-01 NOTE — Telephone Encounter (Signed)
I have attempted to contact pt x 2 regarding need to reschedule appt scheduled 08/06/21 due to provider availability. Unable to leave voicemail as mailbox is full.  Alyce Pagan RN VAD Coordinator  Office: [phone]  24/7 Pager: [phone]

## 2021-08-03 ENCOUNTER — Telehealth (HOSPITAL_COMMUNITY): Payer: Self-pay | Admitting: *Deleted

## 2021-08-03 NOTE — Telephone Encounter (Signed)
Multiple attempts to call patient with no answer and unable to leave message (mailbox full).  Called sister and niece and left message asking them to have patient call us. Pt has VAD Clinic appointment Monday 08/06/21 and Dr. Gala Romney is out of town.  Called patients' cell phone multiple times with hang up and person answering that said [name] was "busy at the moment". Asked this person to have [name] call the VAD office to re-schedule clinic appt.  [name] RN, VAD Coordinator [phone]

## 2021-08-06 ENCOUNTER — Encounter (HOSPITAL_COMMUNITY): Payer: Medicaid Other

## 2021-08-07 ENCOUNTER — Encounter (HOSPITAL_COMMUNITY): Payer: Medicaid Other

## 2021-08-14 ENCOUNTER — Other Ambulatory Visit (HOSPITAL_COMMUNITY): Payer: Self-pay | Admitting: *Deleted

## 2021-08-14 DIAGNOSIS — Z95811 Presence of heart assist device: Secondary | ICD-10-CM

## 2021-08-14 DIAGNOSIS — I5022 Chronic systolic (congestive) heart failure: Secondary | ICD-10-CM

## 2021-08-16 ENCOUNTER — Other Ambulatory Visit (HOSPITAL_COMMUNITY): Payer: Self-pay | Admitting: *Deleted

## 2021-08-16 ENCOUNTER — Ambulatory Visit (HOSPITAL_COMMUNITY)
Admission: RE | Admit: 2021-08-16 | Discharge: 2021-08-16 | Disposition: A | Discharge status: Home / Self Care | Payer: Medicaid Other | Source: Ambulatory Visit | Attending: Cardiology | Admitting: Cardiology

## 2021-08-16 ENCOUNTER — Encounter (HOSPITAL_COMMUNITY): Payer: Self-pay

## 2021-08-16 VITALS — BP 90/75 | HR 80 | Wt 115.8 lb

## 2021-08-16 DIAGNOSIS — T827XXA Infection and inflammatory reaction due to other cardiac and vascular devices, implants and grafts, initial encounter: Secondary | ICD-10-CM

## 2021-08-16 DIAGNOSIS — Z8619 Personal history of other infectious and parasitic diseases: Secondary | ICD-10-CM | POA: Insufficient documentation

## 2021-08-16 DIAGNOSIS — R7989 Other specified abnormal findings of blood chemistry: Secondary | ICD-10-CM | POA: Diagnosis not present

## 2021-08-16 DIAGNOSIS — Z792 Long term (current) use of antibiotics: Secondary | ICD-10-CM | POA: Diagnosis not present

## 2021-08-16 DIAGNOSIS — Z95811 Presence of heart assist device: Secondary | ICD-10-CM | POA: Insufficient documentation

## 2021-08-16 DIAGNOSIS — D696 Thrombocytopenia, unspecified: Secondary | ICD-10-CM | POA: Insufficient documentation

## 2021-08-16 DIAGNOSIS — I5022 Chronic systolic (congestive) heart failure: Secondary | ICD-10-CM | POA: Diagnosis not present

## 2021-08-16 DIAGNOSIS — R7401 Elevation of levels of liver transaminase levels: Secondary | ICD-10-CM

## 2021-08-16 DIAGNOSIS — I5082 Biventricular heart failure: Secondary | ICD-10-CM | POA: Insufficient documentation

## 2021-08-16 DIAGNOSIS — I110 Hypertensive heart disease with heart failure: Secondary | ICD-10-CM | POA: Diagnosis not present

## 2021-08-16 DIAGNOSIS — D691 Qualitative platelet defects: Secondary | ICD-10-CM

## 2021-08-16 DIAGNOSIS — I10 Essential (primary) hypertension: Secondary | ICD-10-CM | POA: Diagnosis not present

## 2021-08-16 DIAGNOSIS — R748 Abnormal levels of other serum enzymes: Secondary | ICD-10-CM

## 2021-08-16 LAB — CBC
HCT: 33.9 % — ABNORMAL LOW (ref 39.0–52.0)
Hemoglobin: 11.4 g/dL — ABNORMAL LOW (ref 13.0–17.0)
MCH: 32.4 pg (ref 26.0–34.0)
MCHC: 33.6 g/dL (ref 30.0–36.0)
MCV: 96.3 fL (ref 80.0–100.0)
Platelets: 90 10*3/uL — ABNORMAL LOW (ref 150–400)
RBC: 3.52 MIL/uL — ABNORMAL LOW (ref 4.22–5.81)
RDW: 14.9 % (ref 11.5–15.5)
WBC: 4 10*3/uL (ref 4.0–10.5)
nRBC: 0 % (ref 0.0–0.2)

## 2021-08-16 LAB — COMPREHENSIVE METABOLIC PANEL
ALT: 46 U/L — ABNORMAL HIGH (ref 0–44)
AST: 102 U/L — ABNORMAL HIGH (ref 15–41)
Albumin: 4.1 g/dL (ref 3.5–5.0)
Alkaline Phosphatase: 67 U/L (ref 38–126)
Anion gap: 12 (ref 5–15)
BUN: 6 mg/dL (ref 6–20)
CO2: 24 mmol/L (ref 22–32)
Calcium: 9.1 mg/dL (ref 8.9–10.3)
Chloride: 102 mmol/L (ref 98–111)
Creatinine, Ser: 0.82 mg/dL (ref 0.61–1.24)
GFR, Estimated: >60 mL/min (ref >60)
Glucose, Bld: 96 mg/dL (ref 70–99)
Potassium: 3.5 mmol/L (ref 3.5–5.1)
Sodium: 138 mmol/L (ref 135–145)
Total Bilirubin: 0.6 mg/dL (ref 0.3–1.2)
Total Protein: 7.1 g/dL (ref 6.5–8.1)

## 2021-08-16 LAB — LACTATE DEHYDROGENASE: LDH: 233 U/L — ABNORMAL HIGH (ref 98–192)

## 2021-08-16 LAB — PREALBUMIN: Prealbumin: 22 mg/dL (ref 18–38)

## 2021-08-16 NOTE — Progress Notes (Signed)
VAD CLINIC NOTE  HPI:  [name] is a 36 y/o male from [place]  w/ h/o severe systolic HF due to NICM EF 15% and prior polysubstance abuse (cocaine and ETOH)   Underwent HM-3 LVAD placement on 02/26/19 by Dr. Morton Peters. Required milrinone post op for RV support.  Admitted 6/21 for AKI and hypotension in setting of volume depletion due to GI bug.Had multiple flow alarms. Echo EF 40-45%   Admitted 12/21 for anemia in setting of supratherapeutic INR (8.0) and bleeding from a sore on his lip. Transfused but did not bleed any further. Developed refractory low flow alarms on VAD with drop in platelets and rising LDH. RUQ U/S was normal (doubt splenic sequestration). Peripheral smear showed no evidence of hemolysis.Patient placed on his left side and low flow alarms resolved. It was suspected possible obstruction/clot of outflow graft causing Plts and Hgb to drop. CT 12/10 showed no evidence of outflow graft kink/clot.  LDH trended back down to his baseline, below 200s. Plts started to trend back up.  Admitted 3/22 for MSSA DL infection requiring surgical debridement. D/c'd to [facility] for IV abx.    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 Wound cx grew MSSA. Treated with cefazolin. Discharged home on 03/16/21 with wound vac. ID recommended oral linezolid BID for 3 weeks at discharge then switch to chronic cefadroxil afterwards.  Here for routine f/u. Doing well. Says he is eating well though weight down 3 pounds. Denies orthopnea or PND. No fevers, chills or problems with driveline. No bleeding, melena or neuro symptoms. No VAD alarms. Taking all meds as prescribed.     VAD Indication: Destination Therapy LVAD due to ETOH use   LVAD assessment: HM III: Speed:  5400 rpms                                 Flow: 4.1                                    Power: 3.9 w                                              PI: 5.0                                              Alarms: none Events: 100+  Fixed speed: 5400 Low speed limit: 5100   Primary Controller: Replace back up battery in 19 months Back up controller:   Replace back up battery in 30 months    I reviewed the LVAD parameters from today and compared the results to the patient's prior recorded data. LVAD interrogation was NEGATIVE for significant power changes, POSITIVE for clinical alarms and POSITIVE for PI events/speed drops. No programming changes were made and pump is functioning within specified parameters. Pt is performing daily controller and system monitor self tests along with completing weekly and monthly maintenance for LVAD equipment.   LVAD equipment check completed and is in good working order. Back-up equipment present.    Annual  Equipment Maintenance on UBC/PM was performed today 04/02/21.   Past Medical History:  Diagnosis Date   Dilated cardiomyopathy (HCC)    Dilated cardiomyopathy (HCC) 02/2019   Polysubstance abuse (HCC)     Current Outpatient Medications  Medication Sig Dispense Refill   cefadroxil (DURICEF) 1 g tablet Take 1 tablet (1 g total) by mouth 2 (two) times daily. 60 tablet 11   KLOR-CON M20 20 MEQ tablet TAKE 2 TABLETS BY MOUTH DAILY,OR AS DIRECTED BY HEART FAILURE CLINIC 180 tablet 1   Zinc Sulfate 220 (50 Zn) MG TABS Take 1 tablet (220 mg total) by mouth daily. 90 tablet 3   No current facility-administered medications for this encounter.    Patient has no known allergies.      Vital Signs:  Doppler Pressure: 90 Automatc BP: 90/75 (82) HR: 80 SPO2: 100% on RA   Weight: 115.8 lb w/o eqt Last clinic weight: 119 lb w/o eqt  Physical Exam: General:  Thin male. NAD.  HEENT: normal  Neck: supple. JVP not elevated.  Carotids 2+ bilat; no bruits. No lymphadenopathy or thryomegaly appreciated. Cor: LVAD hum.  Lungs: Clear. Abdomen: obese soft, nontender, non-distended. No hepatosplenomegaly. No bruits or masses. Good bowel sounds. Driveline site  clean. Anchor in place.  Extremities: no cyanosis, clubbing, rash. Warm no edema  Neuro: alert & oriented x 3. No focal deficits. Moves all 4 without problem    ASSESSMENT AND PLAN:   1. Recurrent MSSA DL infection - Initial episode 3/22 - Recurrent infection 2/23 requiring debridement and wound vac.  - Site now healed - Continue lifelong cefadroxil  2. Chronic systolic HF due to severe NICM - Echo EF 10% with biventricular failure - HM-3 VAD implant 02/26/19 - Echo 7/21 EF 40-45% - Stable NYHA I-II Volume ok  - MAPs ok  3. VAD - Admit 12/21 with multiple low flow alarms. Rising LDH and decreasing PLTs. Suspected outflow graft kink occlusion. Patient placed on his left side and resolved.  - VAD interrogated personally. Parameters stable. - DL site infection resolved - Hgb 11.4 - LDH 233 - INR 4.9 Discussed dosing with PharmD personally.  4. HTN - MAPs ok   5. Elevated LFTs/thrombocytopenia - suspect related to cefuroxil - will d/w ID - Check RUQ u/s - Repeat labs 2 weeks    Total time spent 35 minutes. Over half that time spent discussing above.    Arvilla Meres, MD  5:11 PM

## 2021-08-16 NOTE — Progress Notes (Addendum)
Patient presents for 1 mo f/u with 2.5 year Intermacs in VAD clinic today alone. Denies issues with VAD equipment or drive line.   Pt confirms that he is taking his antibiotics twice daily and he started Zinc. He brought all meds to clinic today.   Wt is down another 4 lbs today. Pt reports he is eating and drink well. Prealbumin 22 (up from 18.6).   Dressing changed in clinic today - see below for full documentation. Pt tells me that he is doing his own dressing change "every 3 days." I stressed good hand washing and sterile technique he assures me that he is doing this. I advanced him to weekly dressing changes today. Advised he must call VAD coordinators for any drainage, redness, tenderness, or foul odor. He verbalized understanding.  Pt's AST & ALT slightly elevated today. Platelet count has also dropped since last check. Per Dr Haroldine Laws will obtain RUQ ultrasound. Will repeat labs at next visit.      Discussed the above with Colletta Maryland NP with ID. Will plan to decrease Cefadroxil to 500 mg BID since drive line has healed, and has been stable the last few visits.   Vital Signs:  Doppler Pressure: 90 Automatc BP: 90/75 (82) HR: 80 SPO2: 100% on RA   Weight: 115.8 lb w/o eqt Last clinic weight: 119 lb w/o eqt   VAD Indication: Destination Therapy LVAD due to narcotic use   LVAD assessment: HM III: Speed:  5400 rpms                                 Flow: 4.1      Power: 3.9 w                                              PI: 5.0                                             Alarms: none Events: 100+  Fixed speed: 5400 Low speed limit: 5100  Primary Controller: Replace back up battery in 19 months Back up controller:   Replace back up battery in 30 months    I reviewed the LVAD parameters from today and compared the results to the patient's prior recorded data. LVAD interrogation was NEGATIVE for significant power changes, POSITIVE for clinical alarms and POSITIVE for PI  events/speed drops. No programming changes were made and pump is functioning within specified parameters. Pt is performing daily controller and system monitor self tests along with completing weekly and monthly maintenance for LVAD equipment.   LVAD equipment check completed and is in good working order. Back-up equipment present.   Annual Equipment Maintenance on UBC/PM was performed today 04/02/21.  Exit Site Care: Existing VAD dressing removed and site care performed using sterile technique. Drive line exit site cleaned with Chlora prep applicators x 2, allowed to dry, and gauze dressing with Silver strip applied. Secured with silk tape Exit site healing and incorporated, the velour is exposed around 2" at exit site. No drainage redness, tenderness, rash or foul odor noted. Dry skin area noted under previous anchor site. Applied silk tape to skin, and adhered anchor over that to see  if this helps his dry skin. Drive line anchor re-applied. Pt denies fever or chills. Continue weekly dressing changes using the daily kit. Change more frequent if driveline starts to drain. Provided with 14 daily kits, 15 anchors, and silk tape.     Device: N/A  BP & Labs:  Doppler BP 90 - Doppler is reflecting MAP   Hgb 11.4- No S/S of bleeding. Specifically denies melena/BRBPR or nosebleeds.   LDH 233 with established baseline of 250 - 430. Denies tea-colored urine. No power elevations noted on interrogation.     2.5 year Intermacs follow up completed including:  Quality of Life, KCCQ-12, and Neurocognitive trail making.   Pt completed 1500 feet during 6 minute walk.  Back up controller: 11V backup battery charged during this visit.  Patient Goals: To see his daughter grow up. His other goal is to take his medications as prescribed, and for his drive line to remain infection free to stay out of the hospital. "I'll do anything you ask me to to improve my health."   [place]      08/16/2021    3:25 PM 02/26/2021    2:03 PM 10/23/2020   11:04 AM  KCCQ-12  1 a. Ability to shower/bathe Not at all limited Not at all limited Not at all limited  1 b. Ability to walk 1 block Not at all limited Not at all limited Not at all limited  1 c. Ability to hurry/jog Not at all limited Not at all limited Not at all limited  2. Edema feet/ankles/legs Never over the past 2 weeks Never over the past 2 weeks Never over the past 2 weeks  3. Limited by fatigue Never over the past 2 weeks Less than once a week Never over the past 2 weeks  4. Limited by dyspnea Never over the past 2 weeks Never over the past 2 weeks Never over the past 2 weeks  5. Sitting up / on 3+ pillows Never over the past 2 weeks Never over the past 2 weeks Never over the past 2 weeks  6. Limited enjoyment of life Not limited at all Moderately limited Limited quite a bit  7. Rest of life w/ symptoms Not at all satisfied Not at all satisfied Not at all satisfied  8 a. Participation in hobbies N/A, did not do for other reasons N/A, did not do for other reasons Did not limit at all  8 b. Participation in chores N/A, did not do for other reasons N/A, did not do for other reasons Did not limit at all  8 c. Visiting family/friends N/A, did not do for other reasons N/A, did not do for other reasons Did not limit at all      Patient Instructions: No change in medications You may change your drive line dressing once a week with daily dressing kit. If you have any drainage, redness, tenderness, foul odor notify VAD coordinators immediately. Office #: [phone] Pager: [phone] Return to clinic in 1 month  Addendum: LFTs slightly elevated today. Discussed with Dr Haroldine Laws and Janene Madeira NP as documented above. Will scheduled liver ultrasound, and decrease Cefadroxil. Will repeat labs in 2 weeks. Will call to discuss with pt once ultrasound scheduled as he is very difficult to get on the phone.  Emerson Monte  RN [place] Coordinator  Office: [phone]  24/7 Pager: [phone]

## 2021-08-16 NOTE — Patient Instructions (Addendum)
No change in medications You may change your drive line dressing once a week with daily dressing kit. If you have any drainage, redness, tenderness, foul odor notify VAD coordinators immediately. Office #: [phone] Pager: [phone] Return to clinic in 1 month

## 2021-08-17 ENCOUNTER — Encounter (HOSPITAL_COMMUNITY): Payer: Self-pay | Admitting: *Deleted

## 2021-08-17 ENCOUNTER — Telehealth (HOSPITAL_COMMUNITY): Payer: Self-pay | Admitting: *Deleted

## 2021-08-17 DIAGNOSIS — T827XXA Infection and inflammatory reaction due to other cardiac and vascular devices, implants and grafts, initial encounter: Secondary | ICD-10-CM

## 2021-08-17 DIAGNOSIS — Z95811 Presence of heart assist device: Secondary | ICD-10-CM

## 2021-08-17 MED ORDER — CEFADROXIL 500 MG PO CAPS: 500.0000 mg | ORAL_CAPSULE | Freq: Two times a day (BID) | ORAL | 11 refills | Status: DC

## 2021-08-17 MED ORDER — CEFADROXIL 500 MG PO CAPS: 500.0000 mg | ORAL_CAPSULE | Freq: Two times a day (BID) | ORAL | 6 refills | Status: DC

## 2021-08-17 NOTE — Addendum Note (Signed)
Addended by: Alyce Pagan B on: 08/17/2021 10:48 AM   Modules accepted: Orders

## 2021-08-17 NOTE — Telephone Encounter (Signed)
Called pt- no answer- voicemail full.  Attempted to contact pt regarding labs from yesterday and need for RUQ ultrasound. Ultrasound scheduled 08/27/21 at 10:00. Pt to arrive at 0945 at admitting. To be NPO past MN. Will need repeat labs in VAD clinic same day.   Discussed pt with Judeth Cornfield NP with ID. Will decrease Cefadroxil to 500 mg BID. Will change medication on MAR today. Pt will need to pick up new prescription from pharmacy.   Will send pt letter with the above instructions. He is very difficult to reach via telephone, and the other numbers listed in his chart also go to voicemail.   Alyce Pagan RN VAD Coordinator  Office: [phone]  24/7 Pager: [phone]

## 2021-08-20 ENCOUNTER — Telehealth (HOSPITAL_COMMUNITY): Payer: Self-pay | Admitting: *Deleted

## 2021-08-20 NOTE — Telephone Encounter (Signed)
Attempted to call pt again regarding RUQ ultrasound and need for repeat labs, as pt needs to set up his transportation in advance. He did not answer, and voicemail is full. Letter sent on Friday.  Alyce Pagan RN VAD Coordinator  Office: [phone]  24/7 Pager: [phone]

## 2021-08-23 ENCOUNTER — Telehealth (HOSPITAL_COMMUNITY): Payer: Self-pay | Admitting: *Deleted

## 2021-08-23 NOTE — Telephone Encounter (Signed)
RUQ abdominal US auth request faxed

## 2021-08-24 ENCOUNTER — Other Ambulatory Visit (HOSPITAL_COMMUNITY): Payer: Self-pay | Admitting: *Deleted

## 2021-08-24 DIAGNOSIS — Z95811 Presence of heart assist device: Secondary | ICD-10-CM

## 2021-08-27 ENCOUNTER — Other Ambulatory Visit (HOSPITAL_COMMUNITY): Payer: Medicaid Other

## 2021-08-27 ENCOUNTER — Telehealth (HOSPITAL_COMMUNITY): Payer: Self-pay | Admitting: *Deleted

## 2021-08-27 ENCOUNTER — Ambulatory Visit (HOSPITAL_COMMUNITY): Payer: Medicaid Other

## 2021-08-27 NOTE — Telephone Encounter (Addendum)
Pt no showed ultrasound and lab appts today.   Called and spoke with patient. Made aware that we have been trying to reach him since his last clinic appt, and that his voicemail is full, and we mailed a letter with [place] and lab appt information to him. Discussed need for him to clear out voicemail so we are able to leave messages. Discussed need to answer phone/return calls to clinic.  Made aware ultrasound rescheduled for 10/02/21 following his appt with Dr Gala Romney. He must be NPO 8 hrs prior to ultrasound. He verbalized understanding to all the above.  Alyce Pagan RN VAD Coordinator  Office: [phone]  24/7 Pager: [phone]

## 2021-08-29 ENCOUNTER — Encounter (HOSPITAL_COMMUNITY): Payer: Self-pay | Admitting: *Deleted

## 2021-08-29 NOTE — Progress Notes (Signed)
Prior authorization received for RUQ ultrasound. Auth #: [id number].  Alyce Pagan RN VAD Coordinator  Office: [phone]  24/7 Pager: [phone]

## 2021-10-02 ENCOUNTER — Ambulatory Visit (HOSPITAL_COMMUNITY)
Admission: RE | Admit: 2021-10-02 | Discharge: 2021-10-02 | Disposition: A | Discharge status: Home / Self Care | Payer: Medicaid Other | Source: Ambulatory Visit | Attending: Internal Medicine | Admitting: Internal Medicine

## 2021-10-02 ENCOUNTER — Encounter (HOSPITAL_COMMUNITY): Payer: Self-pay

## 2021-10-02 ENCOUNTER — Ambulatory Visit (HOSPITAL_COMMUNITY)
Admission: RE | Admit: 2021-10-02 | Discharge: 2021-10-02 | Disposition: A | Discharge status: Home / Self Care | Payer: Medicaid Other | Source: Ambulatory Visit | Attending: Cardiology | Admitting: Cardiology

## 2021-10-02 VITALS — BP 90/62 | HR 87 | Temp 98.1°F | Ht 68.0 in | Wt 105.4 lb

## 2021-10-02 DIAGNOSIS — R748 Abnormal levels of other serum enzymes: Secondary | ICD-10-CM | POA: Diagnosis not present

## 2021-10-02 DIAGNOSIS — I10 Essential (primary) hypertension: Secondary | ICD-10-CM

## 2021-10-02 DIAGNOSIS — E440 Moderate protein-calorie malnutrition: Secondary | ICD-10-CM | POA: Diagnosis not present

## 2021-10-02 DIAGNOSIS — Z95811 Presence of heart assist device: Secondary | ICD-10-CM | POA: Diagnosis not present

## 2021-10-02 DIAGNOSIS — T827XXA Infection and inflammatory reaction due to other cardiac and vascular devices, implants and grafts, initial encounter: Secondary | ICD-10-CM | POA: Diagnosis not present

## 2021-10-02 DIAGNOSIS — I5022 Chronic systolic (congestive) heart failure: Secondary | ICD-10-CM

## 2021-10-02 DIAGNOSIS — R634 Abnormal weight loss: Secondary | ICD-10-CM

## 2021-10-02 LAB — COMPREHENSIVE METABOLIC PANEL
ALT: 69 U/L — ABNORMAL HIGH (ref 0–44)
AST: 96 U/L — ABNORMAL HIGH (ref 15–41)
Albumin: 4.4 g/dL (ref 3.5–5.0)
Alkaline Phosphatase: 53 U/L (ref 38–126)
Anion gap: 13 (ref 5–15)
BUN: 28 mg/dL — ABNORMAL HIGH (ref 6–20)
CO2: 27 mmol/L (ref 22–32)
Calcium: 10.2 mg/dL (ref 8.9–10.3)
Chloride: 92 mmol/L — ABNORMAL LOW (ref 98–111)
Creatinine, Ser: 1.39 mg/dL — ABNORMAL HIGH (ref 0.61–1.24)
GFR, Estimated: >60 mL/min (ref >60)
Glucose, Bld: 121 mg/dL — ABNORMAL HIGH (ref 70–99)
Potassium: 3.9 mmol/L (ref 3.5–5.1)
Sodium: 132 mmol/L — ABNORMAL LOW (ref 135–145)
Total Bilirubin: 2 mg/dL — ABNORMAL HIGH (ref 0.3–1.2)
Total Protein: 7.8 g/dL (ref 6.5–8.1)

## 2021-10-02 LAB — PREALBUMIN: Prealbumin: 31 mg/dL (ref 18–38)

## 2021-10-02 LAB — CBC
HCT: 38.4 % — ABNORMAL LOW (ref 39.0–52.0)
Hemoglobin: 13.6 g/dL (ref 13.0–17.0)
MCH: 33.5 pg (ref 26.0–34.0)
MCHC: 35.4 g/dL (ref 30.0–36.0)
MCV: 94.6 fL (ref 80.0–100.0)
Platelets: 100 10*3/uL — ABNORMAL LOW (ref 150–400)
RBC: 4.06 MIL/uL — ABNORMAL LOW (ref 4.22–5.81)
RDW: 14.2 % (ref 11.5–15.5)
WBC: 5.5 10*3/uL (ref 4.0–10.5)
nRBC: 0 % (ref 0.0–0.2)

## 2021-10-02 LAB — LACTATE DEHYDROGENASE: LDH: 169 U/L (ref 98–192)

## 2021-10-02 MED ORDER — SILVER NITRATE-POT NITRATE 75-25 % EX MISC
1.0000 | CUTANEOUS | Status: DC | PRN
  Filled 2021-10-02: qty 1

## 2021-10-02 NOTE — Patient Instructions (Addendum)
1. Start Ensure twice daily - gift cards provided by Annice Pih (Child psychotherapist) along with food from [employer].  2. No changes in medications. 3. Return to VAD Clinic in 1 month.

## 2021-10-02 NOTE — Progress Notes (Signed)
CSW met with patient to assist with obtaining ensure. Patient has lost weight since last visit and it was suggested that he consider Ensure to supplement his intake. CSW provided gift cards to purchase the ensure as well as a H&V Food Bag to take home with him from clinic today. Patient grateful for the support and states he will purchase the Ensure with his brother. CSW continues to be available to support patient as needed. [name], [place], [place]

## 2021-10-02 NOTE — Progress Notes (Addendum)
Patient presents for 1 mo f/u with 2.5 year in VAD clinic today alone. Denies issues with VAD equipment or drive line.   Patient arrived early, called abdominal US and patient was worked in early for limited RUQ ultrasound.   Patient reports several Low Flow alarms this am while waiting for [place]. Patient says it is because he has been NPO since MN last night for this test. Provided patient with fluid and food.   Wt is down another 10 lbs today. Pt reports he is eating and drink well, but reports he is outside a lot, but when he gets hot, "I throw up". Dr. Haroldine Laws instructed patient to drink Ensure twice daily. [name], LCSW in room and gave patient two gift cards along with bag of food from food pantry.  Prealbumin 31 (up from 22 last visit).   Pt confirms that he is taking his antibiotics twice daily and he started Zinc. He brought all meds to clinic today.   Dressing changed in clinic today - see below for full documentation. Pt tells me that he is doing his own dressing change every week using gauze dressing. Skin under anchor is "better" since placing anchor on top of tape. Advised he must call VAD coordinators for any drainage, redness, tenderness, or foul odor. He verbalized understanding.  Patient taking Cefadroxil to 500 mg BID for lifelong suppression for DL.   Vital Signs:  Temp: 98.1 Doppler Pressure: 80 Automatc BP:  90/62 (69) HR: 87 SPO2: 100% on RA   Weight: 105.4 lb w/o eqt Last clinic weight: 115.8 lb w/o eqt   VAD Indication: Destination Therapy LVAD due to narcotic use   LVAD assessment: HM III: Speed:  5400 rpms                                 Flow: 3.0      Power: 3.9 w                                              PI:  8.0                                            Alarms: none Events: 100+  Fixed speed: 5400 Low speed limit: 5100  Primary Controller: Replace back up battery in 18 months Back up controller:   Did not bring    I reviewed the LVAD  parameters from today and compared the results to the patient's prior recorded data. LVAD interrogation was NEGATIVE for significant power changes, POSITIVE for clinical alarms and POSITIVE for PI events/speed drops. No programming changes were made and pump is functioning within specified parameters. Pt is performing daily controller and system monitor self tests along with completing weekly and monthly maintenance for LVAD equipment.   LVAD equipment check completed and is in good working order. Back-up equipment not present.   Annual Equipment Maintenance on UBC/PM was performed on 04/02/21.  Exit Site Care: Existing VAD dressing removed and site care performed using sterile technique. Drive line exit site cleaned with Chlora prep applicators x 2, allowed to dry, and gauze dressing with Silver strip applied. Exit site healing and incorporated, the velour is exposed  around 2" at exit site. No drainage redness, tenderness, or rash noted. Slight foul odor noted.  Drive line anchor re-applied. Pt denies fever or chills. Continue weekly dressing changes using the daily kit. Change more frequent if driveline starts to drain. Provided with 14 daily kits, 14 anchors, cut sterile silver strips, and paper tape.    Device: N/A  BP & Labs:  Doppler BP 80 - Doppler is reflecting modified systolic BP   Hgb 66.8 -  No S/S of bleeding. Specifically denies melena/BRBPR or nosebleeds.   LDH 169 - with established baseline of 250 - 430. Denies tea-colored urine. No power elevations noted on interrogation.      Patient Instructions: 1. Start Ensure twice daily - gift cards provided by Kennyth Lose (Education officer, museum) along with food from [employer].  2. No changes in medications. 3. Return to [facility] in 1 month.   Zada Girt RN [place] Coordinator  Office: [phone]  24/7 Pager: [phone]

## 2021-10-06 NOTE — Progress Notes (Signed)
VAD CLINIC NOTE  HPI:  [name] is a 36 y/o male from [place]  w/ h/o severe systolic HF due to NICM EF 15% and prior polysubstance abuse (cocaine and ETOH)   Underwent HM-3 LVAD placement on 02/26/19 by Dr. Morton Peters. Required milrinone post op for RV support.  Admitted 6/21 for AKI and hypotension in setting of volume depletion due to GI bug.Had multiple flow alarms. Echo EF 40-45%   Admitted 12/21 for anemia in setting of supratherapeutic INR (8.0) and bleeding from a sore on his lip. Transfused but did not bleed any further. Developed refractory low flow alarms on VAD with drop in platelets and rising LDH. RUQ U/S was normal (doubt splenic sequestration). Peripheral smear showed no evidence of hemolysis.Patient placed on his left side and low flow alarms resolved. It was suspected possible obstruction/clot of outflow graft causing Plts and Hgb to drop. CT 12/10 showed no evidence of outflow graft kink/clot.  LDH trended back down to his baseline, below 200s. Plts started to trend back up.  Admitted 3/22 for MSSA DL infection requiring surgical debridement. D/c'd to [facility] for IV abx.    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 Wound cx grew MSSA. Treated with cefazolin. Discharged home on 03/16/21 with wound vac. ID recommended oral linezolid BID for 3 weeks at discharge then switch to chronic cefadroxil afterwards.  Here for routine f/u. Says he is doing ok but continues to lose weight. Weight down another 10 pounds. Says he is eating ok. Had RUQ u/s due to elevated LFTs which was normal. Denies orthopnea or PND. No fevers, chills or problems with driveline. No bleeding, melena or neuro symptoms. No VAD alarms. Taking all meds as prescribed.    VAD Indication: Destination Therapy LVAD due to narcotic use   LVAD assessment: HM III: Speed:  5400 rpms                                 Flow: 3.0                                    Power: 3.9 w                                               PI:  8.0                                            Alarms: none Events: 100+  Fixed speed: 5400 Low speed limit: 5100   Primary Controller: Replace back up battery in 18 months Back up controller:   Did not bring    I reviewed the LVAD parameters from today and compared the results to the patient's prior recorded data. LVAD interrogation was NEGATIVE for significant power changes, POSITIVE for clinical alarms and POSITIVE for PI events/speed drops. No programming changes were made and pump is functioning within specified parameters. Pt is performing daily controller and system monitor self tests along with completing weekly and monthly maintenance for LVAD equipment.   LVAD equipment check completed  and is in good working order. Back-up equipment not present.    Annual Equipment Maintenance on UBC/PM was performed on 04/02/21.     Past Medical History:  Diagnosis Date   Dilated cardiomyopathy (HCC)    Dilated cardiomyopathy (HCC) 02/2019   Polysubstance abuse (HCC)     Current Outpatient Medications  Medication Sig Dispense Refill   cefadroxil (DURICEF) 500 MG capsule Take 1 capsule (500 mg total) by mouth 2 (two) times daily. 60 capsule 11   KLOR-CON M20 20 MEQ tablet TAKE 2 TABLETS BY MOUTH DAILY,OR AS DIRECTED BY HEART FAILURE CLINIC 180 tablet 1   Zinc Sulfate 220 (50 Zn) MG TABS Take 1 tablet (220 mg total) by mouth daily. 90 tablet 3   No current facility-administered medications for this encounter.    Patient has no known allergies.  Vital Signs:  Temp: 98.1 Doppler Pressure: 80 Automatc BP:  90/62 (69) HR: 87 SPO2: 100% on RA   Weight: 105.4 lb w/o eqt Last clinic weight: 115.8 lb w/o eqt  Physical Exam: General:  Thin male. NAD.  General:  NAD.  HEENT: normal  Neck: supple. JVP not elevated.  Carotids 2+ bilat; no bruits. No lymphadenopathy or thryomegaly appreciated. Cor: LVAD hum.  Lungs: Clear. Abdomen: soft,  nontender, non-distended. No hepatosplenomegaly. No bruits or masses. Good bowel sounds. Driveline site clean. Anchor in place.  Extremities: no cyanosis, clubbing, rash. Warm no edema  Neuro: alert & oriented x 3. No focal deficits. Moves all 4 without problem    ASSESSMENT AND PLAN:   1. Recurrent MSSA DL infection - Initial episode 3/22 - Recurrent infection 2/23 requiring debridement and wound vac.  - Site now healed - Continue lifelong cefadroxil. No change. He has been complaint  2. Chronic systolic HF due to severe NICM - Echo EF 10% with biventricular failure - HM-3 VAD implant 02/26/19 - Echo 7/21 EF 40-45% - Stable NYHA I-II Volume ok  - MAPs ok   3. VAD - Admit 12/21 with multiple low flow alarms. Rising LDH and decreasing PLTs. Suspected outflow graft kink occlusion. Patient placed on his left side and resolved.  - VAD interrogated personally. Parameters stable. - DL site ok (he is doing his own dressing changes) - Hgb 13.6 - LDH 169 - Off warfarin due to elevated labile INRs.  4. HTN - MAPs ok  5. Elevated LFTs/thrombocytopenia - suspect related to cefuroxil - Now trending down - RUQ u/s 8/23 ok. Hepatitis panels negative - Repeat labs 2 weeks   6. Weight loss - unexplained - started after his mother died. I worry he isnt eating enough but procalcitonin ok - Encouraged him to drink 2 ensure daily - check TFTs  Total time spent 35 minutes. Over half that time spent discussing above.    Arvilla Meres, MD  7:09 PM

## 2021-10-09 ENCOUNTER — Telehealth: Payer: Self-pay | Admitting: *Deleted

## 2021-10-09 NOTE — Telephone Encounter (Signed)
Attempted to contact pt to discuss need for TSH/T4 at local [facility] per Dr Gala Romney, but phone number appears to be disconnected at this time. Attempted to contact pt's brother (unable to leave a Engineer, technical sales) and niece (phone disconnected). Will add TSH and T4 to next clinic visit.   Alyce Pagan RN VAD Coordinator  Office: [phone]  24/7 Pager: [phone]

## 2021-11-05 ENCOUNTER — Other Ambulatory Visit (HOSPITAL_COMMUNITY): Payer: Self-pay

## 2021-11-05 DIAGNOSIS — R634 Abnormal weight loss: Secondary | ICD-10-CM

## 2021-11-05 DIAGNOSIS — Z95811 Presence of heart assist device: Secondary | ICD-10-CM

## 2021-11-05 DIAGNOSIS — E440 Moderate protein-calorie malnutrition: Secondary | ICD-10-CM

## 2021-11-05 DIAGNOSIS — T827XXA Infection and inflammatory reaction due to other cardiac and vascular devices, implants and grafts, initial encounter: Secondary | ICD-10-CM

## 2021-11-05 DIAGNOSIS — R748 Abnormal levels of other serum enzymes: Secondary | ICD-10-CM

## 2021-11-06 ENCOUNTER — Ambulatory Visit (HOSPITAL_COMMUNITY)
Admission: RE | Admit: 2021-11-06 | Discharge: 2021-11-06 | Disposition: A | Discharge status: Home / Self Care | Payer: Medicaid Other | Source: Ambulatory Visit | Attending: Cardiology | Admitting: Cardiology

## 2021-11-06 DIAGNOSIS — R634 Abnormal weight loss: Secondary | ICD-10-CM | POA: Insufficient documentation

## 2021-11-06 DIAGNOSIS — Z792 Long term (current) use of antibiotics: Secondary | ICD-10-CM | POA: Diagnosis not present

## 2021-11-06 DIAGNOSIS — T827XXA Infection and inflammatory reaction due to other cardiac and vascular devices, implants and grafts, initial encounter: Secondary | ICD-10-CM

## 2021-11-06 DIAGNOSIS — E440 Moderate protein-calorie malnutrition: Secondary | ICD-10-CM | POA: Diagnosis not present

## 2021-11-06 DIAGNOSIS — R748 Abnormal levels of other serum enzymes: Secondary | ICD-10-CM | POA: Diagnosis not present

## 2021-11-06 DIAGNOSIS — Z95811 Presence of heart assist device: Secondary | ICD-10-CM | POA: Diagnosis present

## 2021-11-06 LAB — CBC
HCT: 34.3 % — ABNORMAL LOW (ref 39.0–52.0)
Hemoglobin: 12 g/dL — ABNORMAL LOW (ref 13.0–17.0)
MCH: 33.1 pg (ref 26.0–34.0)
MCHC: 35 g/dL (ref 30.0–36.0)
MCV: 94.5 fL (ref 80.0–100.0)
Platelets: 181 10*3/uL (ref 150–400)
RBC: 3.63 MIL/uL — ABNORMAL LOW (ref 4.22–5.81)
RDW: 14.5 % (ref 11.5–15.5)
WBC: 4.3 10*3/uL (ref 4.0–10.5)
nRBC: 0 % (ref 0.0–0.2)

## 2021-11-06 LAB — COMPREHENSIVE METABOLIC PANEL
ALT: 28 U/L (ref 0–44)
AST: 67 U/L — ABNORMAL HIGH (ref 15–41)
Albumin: 4.1 g/dL (ref 3.5–5.0)
Alkaline Phosphatase: 66 U/L (ref 38–126)
Anion gap: 16 — ABNORMAL HIGH (ref 5–15)
BUN: 16 mg/dL (ref 6–20)
CO2: 27 mmol/L (ref 22–32)
Calcium: 9.8 mg/dL (ref 8.9–10.3)
Chloride: 92 mmol/L — ABNORMAL LOW (ref 98–111)
Creatinine, Ser: 0.9 mg/dL (ref 0.61–1.24)
GFR, Estimated: >60 mL/min (ref >60)
Glucose, Bld: 121 mg/dL — ABNORMAL HIGH (ref 70–99)
Potassium: 2.8 mmol/L — ABNORMAL LOW (ref 3.5–5.1)
Sodium: 135 mmol/L (ref 135–145)
Total Bilirubin: 0.4 mg/dL (ref 0.3–1.2)
Total Protein: 7.5 g/dL (ref 6.5–8.1)

## 2021-11-06 LAB — LACTATE DEHYDROGENASE: LDH: 155 U/L (ref 98–192)

## 2021-11-06 LAB — T4, FREE: Free T4: 0.89 ng/dL (ref 0.61–1.12)

## 2021-11-06 NOTE — Patient Instructions (Signed)
No change in medications Return to clinic in 2 mo 

## 2021-11-06 NOTE — Progress Notes (Signed)
Patient presents for 1 mo f/u in VAD clinic today alone. Denies issues with VAD equipment or drive line.    Wt is up 6 lbs today. Pt reports that he is eating well such as "cakes and milk."  Pt confirms that he is taking his antibiotics twice daily and he started Zinc. He brought all meds to clinic today. Pt tells me that his pharmacy has been out of Potassium and he is waiting for "truck to arrive with my medicine." Pt has not taken potassium since Saturday. Pts K is 2.8 today; pt was instructed to take 6 extra pills today in addition to his 2. Called pt at home and left him a vm.  Dressing changed in clinic today - see below for full documentation. Pt tells me that he is doing his own dressing change every week using gauze dressing.  Advised he must call VAD coordinators for any drainage, redness, tenderness, or foul odor. He verbalized understanding.  Patient taking Cefadroxil to 500 mg BID for lifelong suppression for DL.     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 LVAD interrogation was NEGATIVE for significant power changes, POSITIVE for clinical alarms and POSITIVE for PI events/speed drops. No programming changes were made and pump is functioning within specified parameters. Pt is performing daily controller and system monitor  self tests along with completing weekly and monthly maintenance for LVAD equipment.   LVAD equipment check completed and is in good working order. Back-up equipment not present.   Annual Equipment Maintenance on UBC/PM was performed on 04/02/21.  Exit Site Care: Existing VAD dressing removed and site care performed using sterile technique. Drive line exit site cleaned with Chlora prep applicators x 2, allowed to dry, and Silverlon dressing w/sorbaview placed.Exit site healing and incorporated, the velour is exposed around 2" at exit site. No drainage redness, tenderness, or rash noted.  Drive line anchor re-applied. Pt denies fever or chills. Continue weekly dressing changes using the weekly kit. Change back to daily kits if driveline starts to drain. Provided with 8 weekly kits, and silk tape.        Device: N/A  BP & Labs:  Doppler BP 88 -  Doppler is reflecting modified systolic BP   Hgb 12 -  No S/S of bleeding. Specifically denies melena/BRBPR or nosebleeds.   LDH 169 - with established baseline of 250 - 430. Denies tea-colored urine. No power elevations noted on interrogation.      Patient Instructions: 1. Take 6 extra potassium pills (120 mEq) tonight in addition to your 40 mEq 2. Return to clinic in 2 months  Tanda Rockers RN [place] Coordinator  Office: [phone]  24/7 Pager: [phone]

## 2021-11-21 LAB — DRUG SCREEN 10 W/CONF, SERUM
Amphetamines, IA: NEGATIVE ng/mL
Barbiturates, IA: NEGATIVE ug/mL
Benzodiazepines, IA: NEGATIVE ng/mL
Cocaine & Metabolite, IA: NEGATIVE ng/mL
Methadone, IA: NEGATIVE ng/mL
Opiates, IA: NEGATIVE ng/mL
Oxycodones, IA: NEGATIVE ng/mL
Phencyclidine, IA: NEGATIVE ng/mL
Propoxyphene, IA: NEGATIVE ng/mL
THC(Marijuana) Metabolite, IA: POSITIVE ng/mL — AB

## 2021-11-21 LAB — THC,MS,WB/SP RFX
Cannabidiol: NEGATIVE ng/mL
Cannabinoid Confirmation: POSITIVE
Carboxy-THC: 66.8 ng/mL
Hydroxy-THC: 2.4 ng/mL
Tetrahydrocannabinol(THC): 2.4 ng/mL

## 2022-01-07 ENCOUNTER — Encounter (HOSPITAL_COMMUNITY): Payer: Medicaid Other

## 2022-01-23 ENCOUNTER — Other Ambulatory Visit (HOSPITAL_COMMUNITY): Payer: Self-pay | Admitting: *Deleted

## 2022-01-23 DIAGNOSIS — Z95811 Presence of heart assist device: Secondary | ICD-10-CM

## 2022-01-23 DIAGNOSIS — I5022 Chronic systolic (congestive) heart failure: Secondary | ICD-10-CM

## 2022-01-25 ENCOUNTER — Encounter (HOSPITAL_COMMUNITY): Payer: Medicaid Other | Admitting: Internal Medicine

## 2022-02-22 ENCOUNTER — Ambulatory Visit (HOSPITAL_COMMUNITY)
Admission: RE | Admit: 2022-02-22 | Discharge: 2022-02-22 | Disposition: A | Discharge status: Home / Self Care | Payer: Medicaid Other | Source: Ambulatory Visit | Attending: Internal Medicine | Admitting: Internal Medicine

## 2022-02-22 VITALS — BP 100/0 | HR 86 | Temp 98.0°F | Ht 68.0 in | Wt 109.8 lb

## 2022-02-22 DIAGNOSIS — I5022 Chronic systolic (congestive) heart failure: Secondary | ICD-10-CM

## 2022-02-22 DIAGNOSIS — Z7901 Long term (current) use of anticoagulants: Secondary | ICD-10-CM | POA: Insufficient documentation

## 2022-02-22 DIAGNOSIS — Z95811 Presence of heart assist device: Secondary | ICD-10-CM | POA: Diagnosis not present

## 2022-02-22 DIAGNOSIS — N179 Acute kidney failure, unspecified: Secondary | ICD-10-CM | POA: Insufficient documentation

## 2022-02-22 DIAGNOSIS — B9561 Methicillin susceptible Staphylococcus aureus infection as the cause of diseases classified elsewhere: Secondary | ICD-10-CM | POA: Insufficient documentation

## 2022-02-22 DIAGNOSIS — I110 Hypertensive heart disease with heart failure: Secondary | ICD-10-CM | POA: Insufficient documentation

## 2022-02-22 DIAGNOSIS — E440 Moderate protein-calorie malnutrition: Secondary | ICD-10-CM | POA: Diagnosis not present

## 2022-02-22 DIAGNOSIS — D696 Thrombocytopenia, unspecified: Secondary | ICD-10-CM | POA: Diagnosis not present

## 2022-02-22 DIAGNOSIS — I420 Dilated cardiomyopathy: Secondary | ICD-10-CM | POA: Insufficient documentation

## 2022-02-22 DIAGNOSIS — T829XXD Unspecified complication of cardiac and vascular prosthetic device, implant and graft, subsequent encounter: Secondary | ICD-10-CM

## 2022-02-22 DIAGNOSIS — F1410 Cocaine abuse, uncomplicated: Secondary | ICD-10-CM | POA: Diagnosis not present

## 2022-02-22 DIAGNOSIS — I5082 Biventricular heart failure: Secondary | ICD-10-CM | POA: Insufficient documentation

## 2022-02-22 DIAGNOSIS — R748 Abnormal levels of other serum enzymes: Secondary | ICD-10-CM

## 2022-02-22 LAB — CBC
HCT: 37.9 % — ABNORMAL LOW (ref 39.0–52.0)
Hemoglobin: 12.8 g/dL — ABNORMAL LOW (ref 13.0–17.0)
MCH: 32.9 pg (ref 26.0–34.0)
MCHC: 33.8 g/dL (ref 30.0–36.0)
MCV: 97.4 fL (ref 80.0–100.0)
Platelets: 150 10*3/uL (ref 150–400)
RBC: 3.89 MIL/uL — ABNORMAL LOW (ref 4.22–5.81)
RDW: 14.8 % (ref 11.5–15.5)
WBC: 5.1 10*3/uL (ref 4.0–10.5)
nRBC: 0 % (ref 0.0–0.2)

## 2022-02-22 LAB — LACTATE DEHYDROGENASE: LDH: 146 U/L (ref 98–192)

## 2022-02-22 LAB — PROTIME-INR
INR: 1.1 (ref 0.8–1.2)
Prothrombin Time: 13.7 seconds (ref 11.4–15.2)

## 2022-02-22 LAB — COMPREHENSIVE METABOLIC PANEL
ALT: 32 U/L (ref 0–44)
AST: 45 U/L — ABNORMAL HIGH (ref 15–41)
Albumin: 4.8 g/dL (ref 3.5–5.0)
Alkaline Phosphatase: 61 U/L (ref 38–126)
Anion gap: 22 — ABNORMAL HIGH (ref 5–15)
BUN: 11 mg/dL (ref 6–20)
CO2: 19 mmol/L — ABNORMAL LOW (ref 22–32)
Calcium: 10.3 mg/dL (ref 8.9–10.3)
Chloride: 99 mmol/L (ref 98–111)
Creatinine, Ser: 1.4 mg/dL — ABNORMAL HIGH (ref 0.61–1.24)
GFR, Estimated: >60 mL/min (ref >60)
Glucose, Bld: 134 mg/dL — ABNORMAL HIGH (ref 70–99)
Potassium: 3.5 mmol/L (ref 3.5–5.1)
Sodium: 140 mmol/L (ref 135–145)
Total Bilirubin: 2.3 mg/dL — ABNORMAL HIGH (ref 0.3–1.2)
Total Protein: 8.6 g/dL — ABNORMAL HIGH (ref 6.5–8.1)

## 2022-02-22 LAB — PREALBUMIN: Prealbumin: 34 mg/dL (ref 18–38)

## 2022-02-22 NOTE — Patient Instructions (Signed)
No change in medications 2. Return to clinic in 2 months

## 2022-02-22 NOTE — Progress Notes (Signed)
Patient presents for 2 mo f/u in VAD clinic today alone for 20yr intermacs and annual main. Denies issues with VAD equipment or drive line.    Wt is up 4 lbs today. Pt reports that he is eating well such as "cakes and milk."  Pt confirms that he is taking his antibiotics twice daily and he started Zinc. He brought all meds to clinic today.   Dressing changed in clinic today - see below for full documentation. Pt tells me that he is doing his own dressing change every week using gauze dressing.  Advised he must call VAD coordinators for any drainage, redness, tenderness, or foul odor. He verbalized understanding.  Patient taking Cefadroxil to 500 mg BID for lifelong suppression for DL.   Pt had a small tear in his driveline that the wires were exposed. This area was repaired with rescue tape.  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 LVAD interrogation was NEGATIVE for significant power changes, POSITIVE for clinical alarms and POSITIVE for PI events/speed drops. No programming changes were made and pump is functioning within specified parameters. Pt is performing daily controller and system monitor self tests along with completing weekly and monthly maintenance for LVAD  equipment.   LVAD equipment check completed and is in good working order. Back-up equipment not present.   Annual Equipment Maintenance on UBC/PM was performed on 02/22/22.  Exit Site Care: Existing VAD dressing removed and site care performed using sterile technique. Drive line exit site cleaned with Chlora prep applicators x 2, allowed to dry, and Silverlon dressing w/sorbaview placed.Exit site healing and incorporated, the velour is exposed around 2" at exit site. No drainage redness, tenderness, or rash noted.  Drive line anchor re-applied. Pt denies fever or chills. Continue weekly dressing changes using the weekly kit. Change back to daily kits if driveline starts to drain. Provided with 8 weekly kits, and silk tape.     Device: N/A  BP & Labs:  Doppler BP 100 - Doppler is reflecting modified systolic BP   Hgb 27.7 -  No S/S of bleeding. Specifically denies melena/BRBPR or nosebleeds.  LDH 169 - with established baseline of 250 - 430. Denies tea-colored urine. No power elevations noted on interrogation.      Batteries Manufacture Date: Number of uses: Re-calibration  NEW 0 Performed by patient   Annual maintenance completed per [employer] on patient's home power module and Electrical engineer.    Backup system controller 11 volt battery charged during visit.   3 year Intermacs follow up completed including:  Quality of Life, KCCQ-12, and Neurocognitive trail making.   Pt completed 1600 feet during 6 minute walk.  Patient Instructions: 1. No change in medications 2. Please push liquids as your labs show you are a bit dehydrated today 3. Return to clinic in 2 months  Tanda Rockers RN [place] Coordinator  Office: [phone]  24/7 Pager: [phone]

## 2022-02-23 NOTE — Progress Notes (Signed)
VAD CLINIC NOTE  HPI:  [name] is a 37 y/o male from [place]  w/ h/o severe systolic HF due to NICM EF 15% and prior polysubstance abuse (cocaine and ETOH)   Underwent HM-3 LVAD placement on 02/26/19 by Dr. Nils Pyle. Required milrinone post op for RV support.  Admitted 6/21 for AKI and hypotension in setting of volume depletion due to GI bug.Had multiple flow alarms. Echo EF 40-45%   Admitted 12/21 for anemia in setting of supratherapeutic INR (8.0) and bleeding from a sore on his lip. Transfused but did not bleed any further. Developed refractory low flow alarms on VAD with drop in platelets and rising LDH. RUQ U/S was normal (doubt splenic sequestration). Peripheral smear showed no evidence of hemolysis.Patient placed on his left side and low flow alarms resolved. It was suspected possible obstruction/clot of outflow graft causing Plts and Hgb to drop. CT 12/10 showed no evidence of outflow graft kink/clot.  LDH trended back down to his baseline, below 200s. Plts started to trend back up.  Admitted 3/22 for MSSA DL infection requiring surgical debridement. D/c'd to [facility] for IV abx.    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 Wound cx grew MSSA. Treated with cefazolin. Discharged home on 03/16/21 with wound vac. ID recommended oral linezolid BID for 3 weeks at discharge then switch to chronic cefadroxil afterwards.  Here for routine f/u. Says he is doing well. Now has his own mini refrigerator and is eating more. Gained 4 pounds, Denies SOB, edema, orthopnea or PND. Denies orthopnea or PND. No fevers, chills or problems with driveline. No bleeding, melena or neuro symptoms. No VAD alarms. Taking all meds as prescribed.     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 LVAD interrogation was NEGATIVE for significant power changes, POSITIVE for clinical alarms and POSITIVE for PI events/speed drops. No programming changes were made and pump is functioning within specified parameters. Pt is performing daily controller and system monitor self tests along with completing weekly and monthly maintenance for LVAD equipment.   LVAD equipment check completed and is in good working order. Back-up equipment  not present.    Annual Equipment Maintenance on UBC/PM was performed on 02/22/22     Past Medical History:  Diagnosis Date   Dilated cardiomyopathy ([place])    Dilated cardiomyopathy ([place]) 02/2019   Polysubstance abuse ([facility])     Current Outpatient Medications  Medication Sig Dispense Refill   cefadroxil (DURICEF) 500 MG capsule Take 1 capsule (500 mg total) by mouth 2 (two) times daily. 60 capsule 11   KLOR-CON M20 20 MEQ tablet TAKE 2 TABLETS BY MOUTH DAILY,OR AS DIRECTED BY HEART FAILURE CLINIC 180 tablet 1   Zinc Sulfate 220 (50 Zn) MG TABS Take 1 tablet (220 mg total) by mouth daily. 90 tablet 3   No current facility-administered medications for this encounter.    Patient has no known allergies.  Vital Signs:  Doppler Pressure: 100 Automatc BP:  107/77 (92) HR: 86 SPO2: 100% on RA   Weight: 111.6 lb w/o eqt Last clinic weight: 105.4 lb w/o eqt  Physical Exam: General:  Thin male. NAD.  HEENT: normal  Neck: supple. JVP not elevated.  Carotids 2+ bilat; no bruits. No lymphadenopathy or thryomegaly appreciated. Cor: LVAD hum.  Lungs: Clear. Abdomen:  soft, nontender, non-distended. No  hepatosplenomegaly. No bruits or masses. Good bowel sounds. Driveline site clean. Anchor in place.  Extremities: no cyanosis, clubbing, rash. Warm no edema  Neuro: alert & oriented x 3. No focal deficits. Moves all 4 without problem     ASSESSMENT AND PLAN:   1. Recurrent MSSA DL infection - Initial episode 3/22 - Recurrent infection 2/23 requiring debridement and wound vac.  - Site now healed - Continue lifelong cefadroxil. He has been complaint with his abx. Site looks good.   2. Chronic systolic HF due to severe NICM - Echo EF 10% with biventricular failure - HM-3 VAD implant 02/26/19 - Echo 7/21 EF 40-45% - Stable NYHA I-II. Volume status ok.  - MAPs OKs   3. VAD - Admit 12/21 with multiple low flow alarms. Rising LDH and decreasing PLTs. Suspected outflow graft kink occlusion. Patient placed on his left side and resolved.  - VAD interrogated personally. Parameters stable.. - DL site ok. (he is doing his own dressing changes) - He had a tear in DL cable which we repaired today. We also gave him new batteries - Hgb 12.8 - LDH 146 - Off warfarin due to elevated labile INRs.  4. HTN - MAPs ok  5. Elevated LFTs/thrombocytopenia - suspect related to cefuroxil - Now trending down - RUQ u/s 8/23 ok. Hepatitis panels negative  6. AKI, mild - Scr 0.9 -> 1.4  - Unclear etiology. Recheck at next visit.    Glori Bickers, MD  8:12 PM

## 2022-03-21 IMAGING — CR DG CHEST 2V
2 series · 2 of 2 positions shown · non-contrast
Comparison: 03/15/2019

CLINICAL DATA: CHF

EXAM:
CHEST - 2 VIEW

[chest pa]
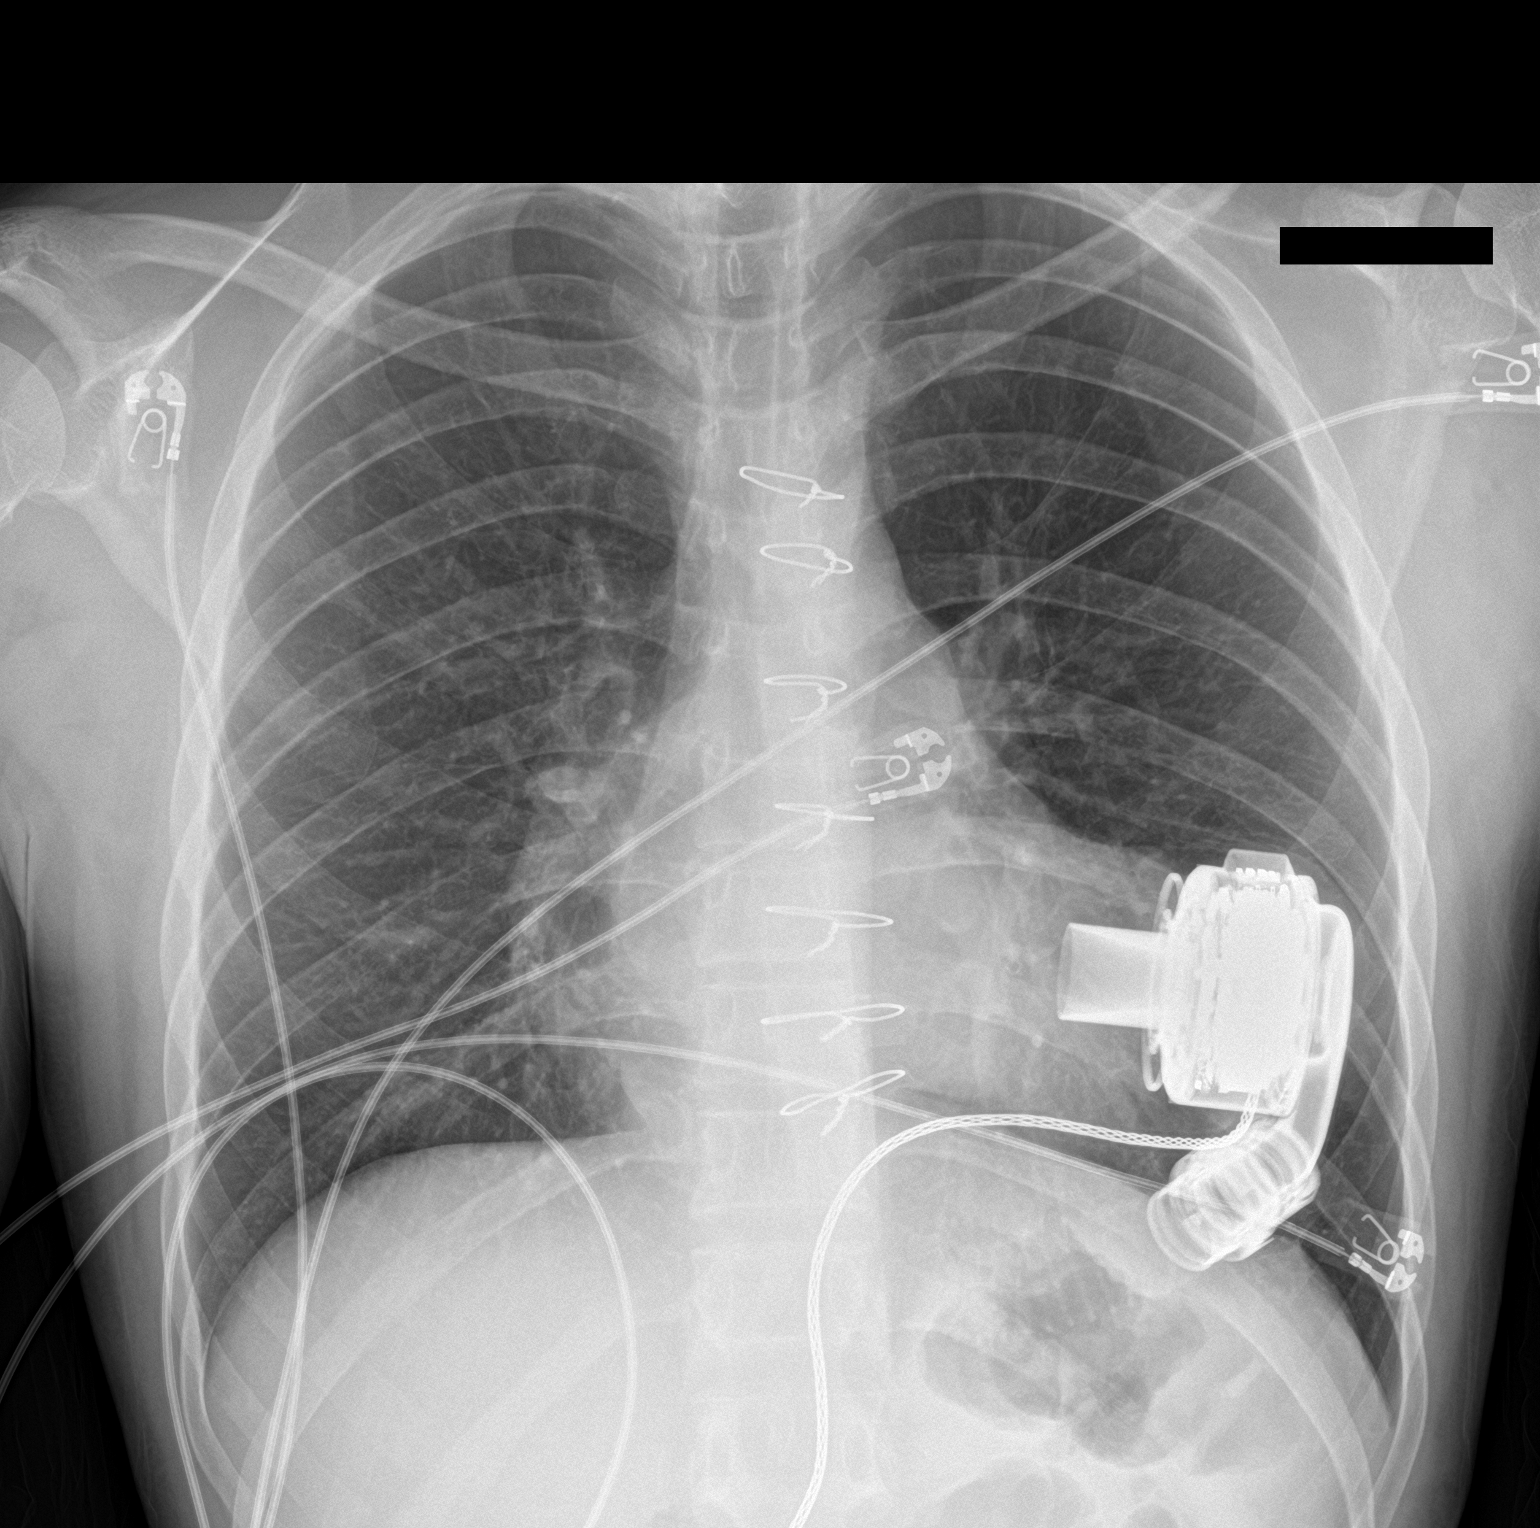

[chest lat]
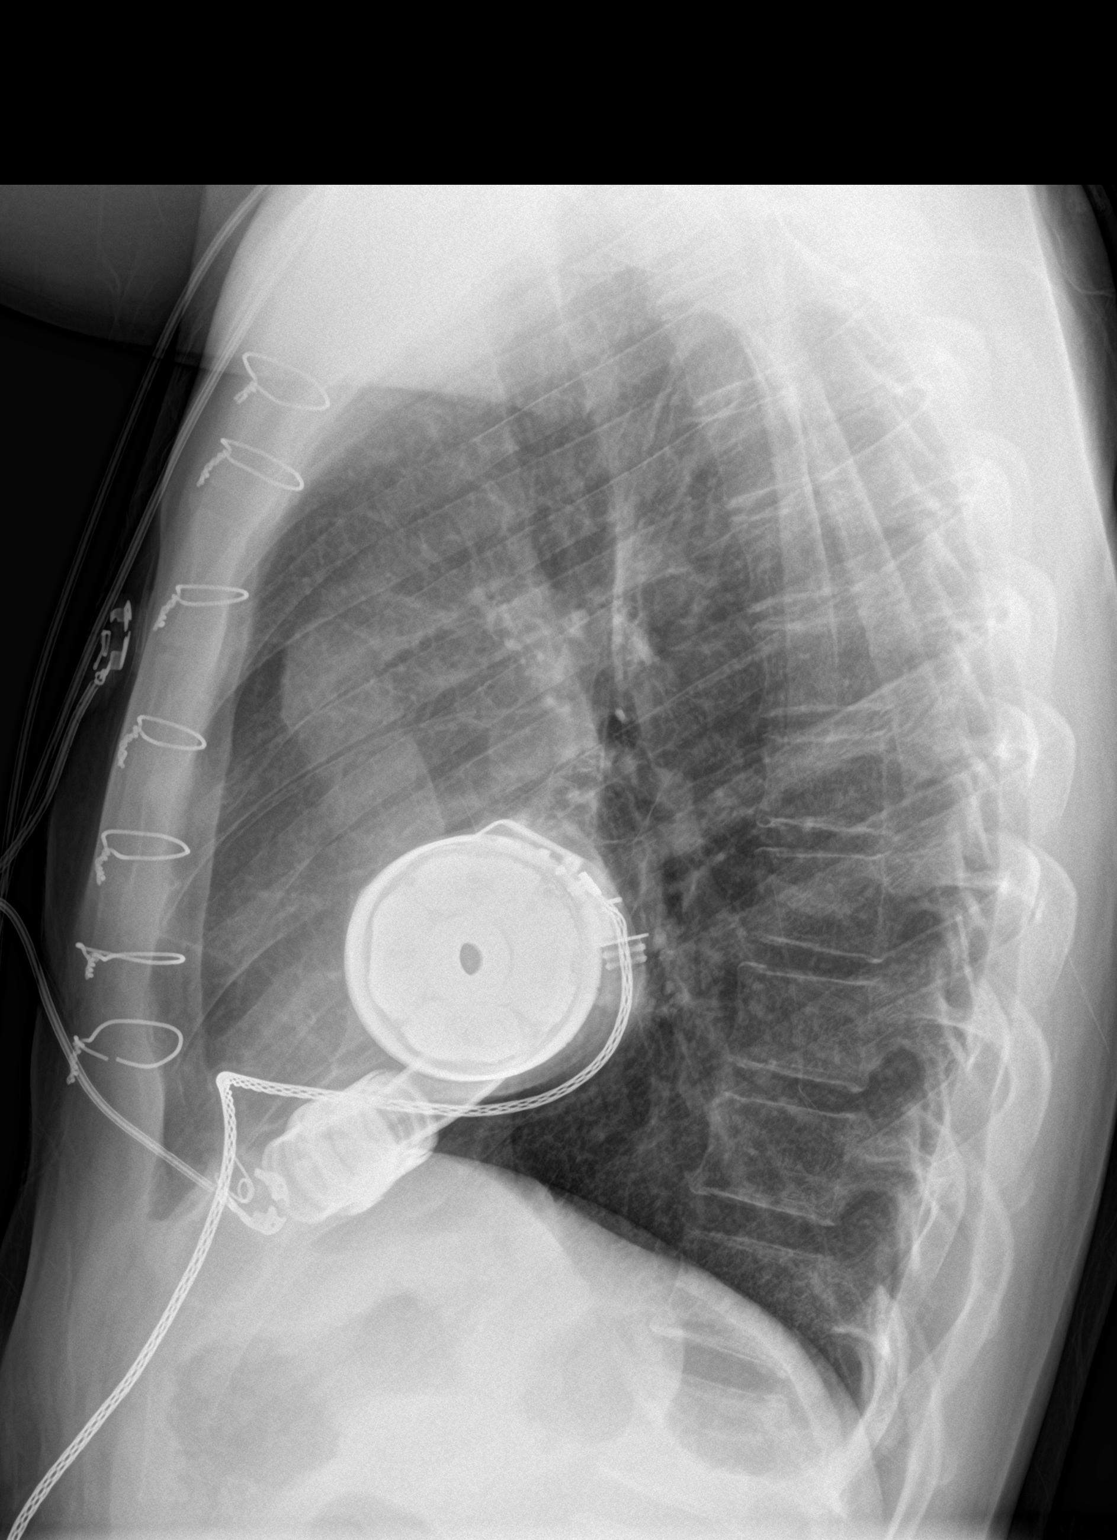

[2 of 2 positions shown; findings below may reference images not displayed]

FINDINGS: Post sternotomy changes. LVAD grossly similar in position. No acute
airspace disease, pleural effusion, or pneumothorax. Normal
cardiomediastinal silhouette.
IMPRESSION: No active cardiopulmonary disease.

## 2022-03-22 IMAGING — CT CT CHEST-ABD-PELV W/ CM
2 of 4 series · 13 of 36 positions shown, 15 images · IV contrast (APPLIED)
Comparison: Previous studies including the examination of
05/12/2020

CLINICAL DATA: Sepsis

EXAM:
CT CHEST, ABDOMEN, AND PELVIS WITH CONTRAST
TECHNIQUE: Multidetector CT imaging of the chest, abdomen and pelvis was
performed following the standard protocol during bolus
administration of intravenous contrast.

[Series 3: cap 5.0 i31f 2 (person_name) · axial · 0.71mm/px · z∈[-576,-1]mm · 10 of 139 slices shown, 12 images]
[im 12/139  mediastinal]
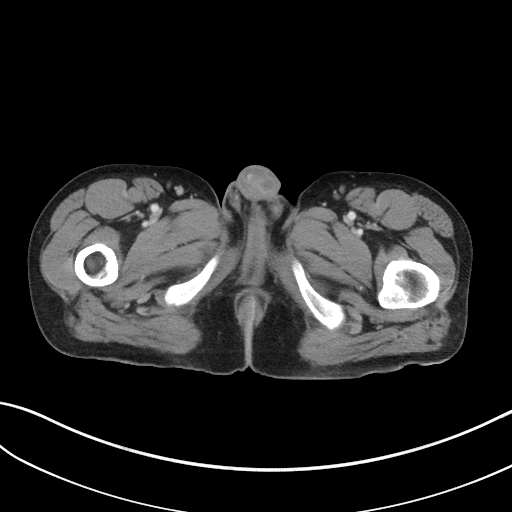
[im 12/139  bone]
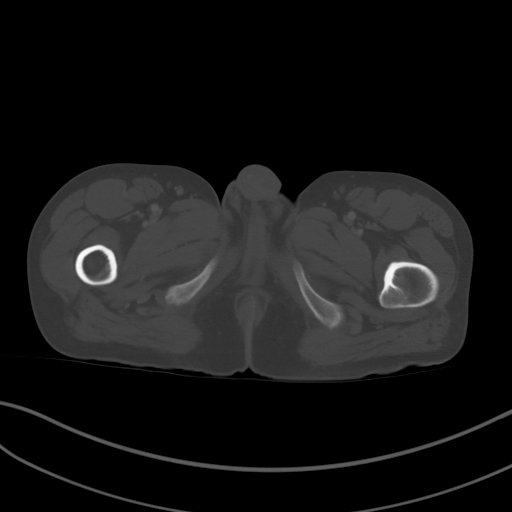
[im 24/139  mediastinal]
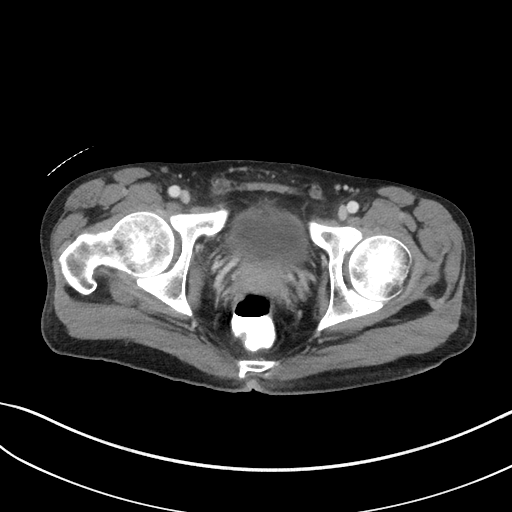
[im 35/139  mediastinal]
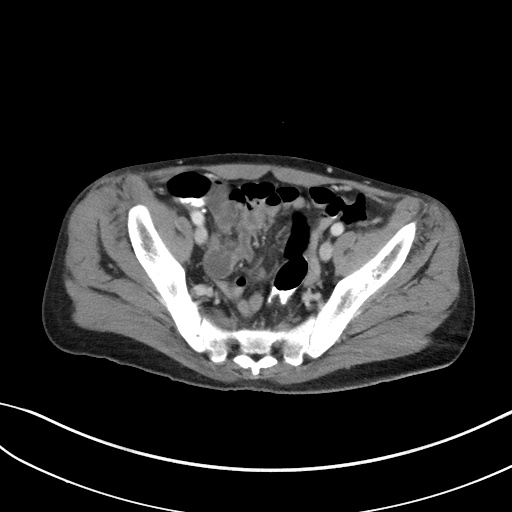
[im 47/139  mediastinal]
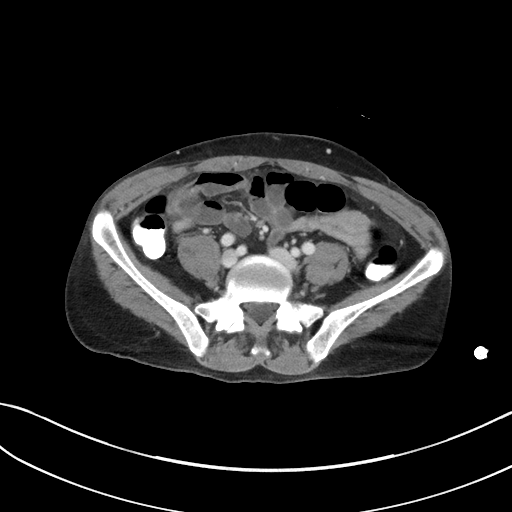
[im 58/139  mediastinal]
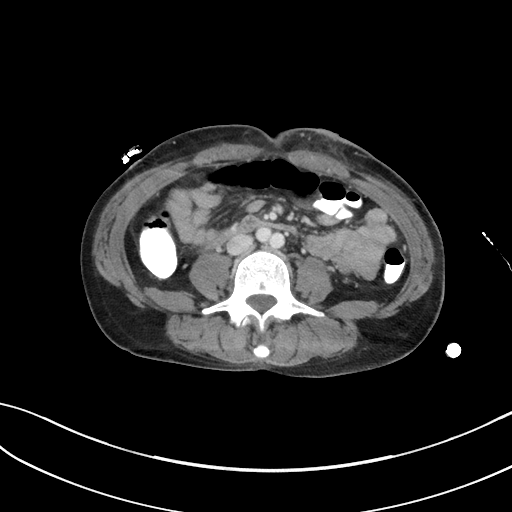
[im 81/139  mediastinal]
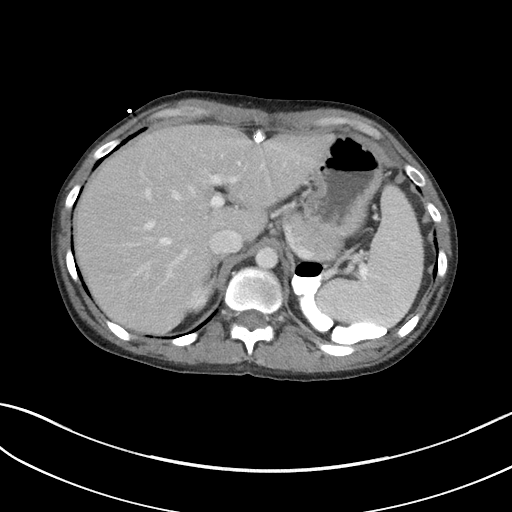
[im 93/139  mediastinal]
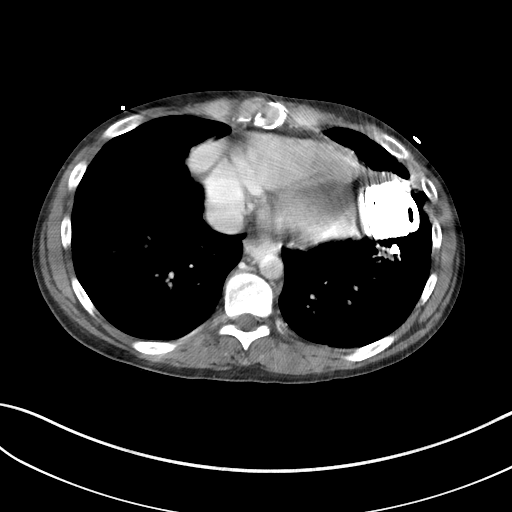
[im 104/139  mediastinal]
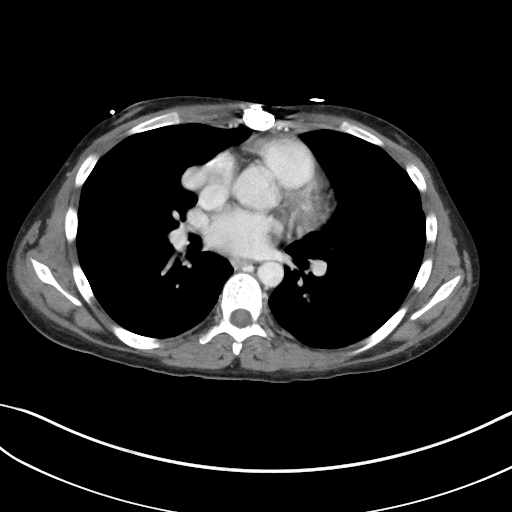
[im 116/139  mediastinal]
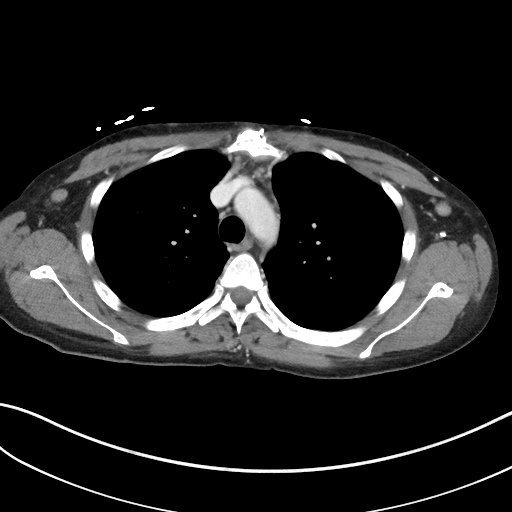
[im 116/139  bone]
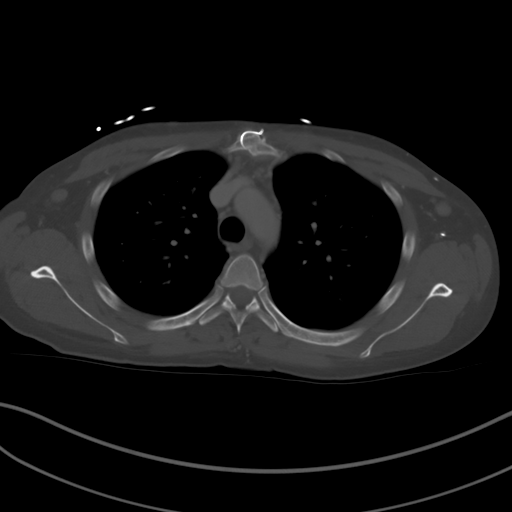
[im 127/139  mediastinal]
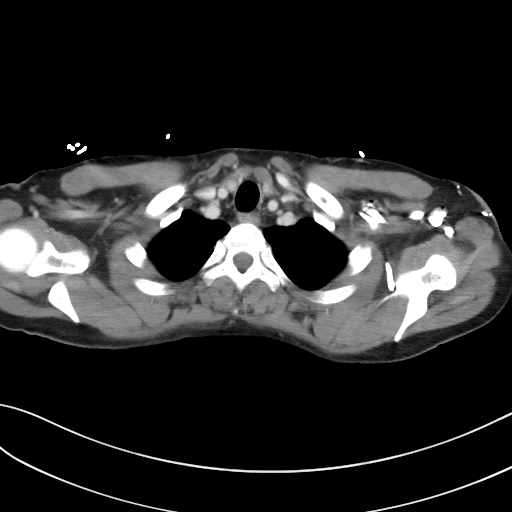

[Series 6: coronal · coronal · 0.79mm/px · 3 of 151 slices shown]
[im 31/151  mediastinal]
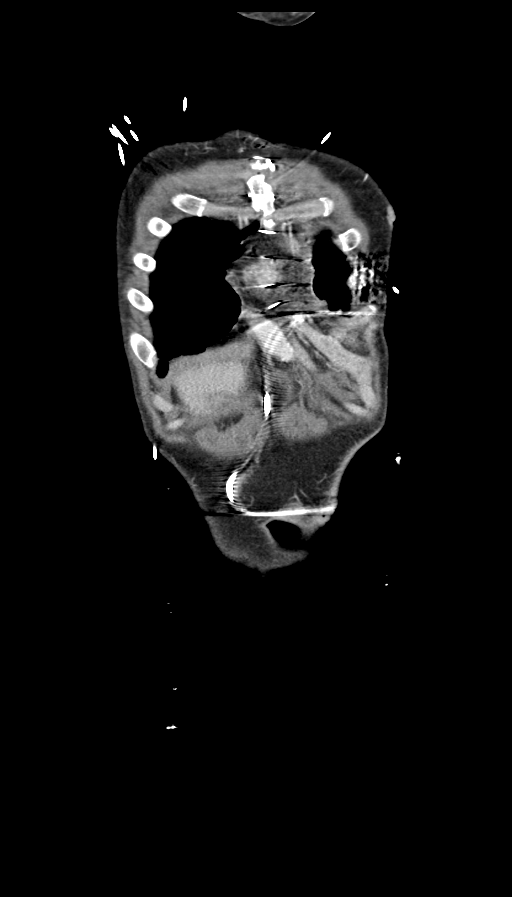
[im 61/151  mediastinal]
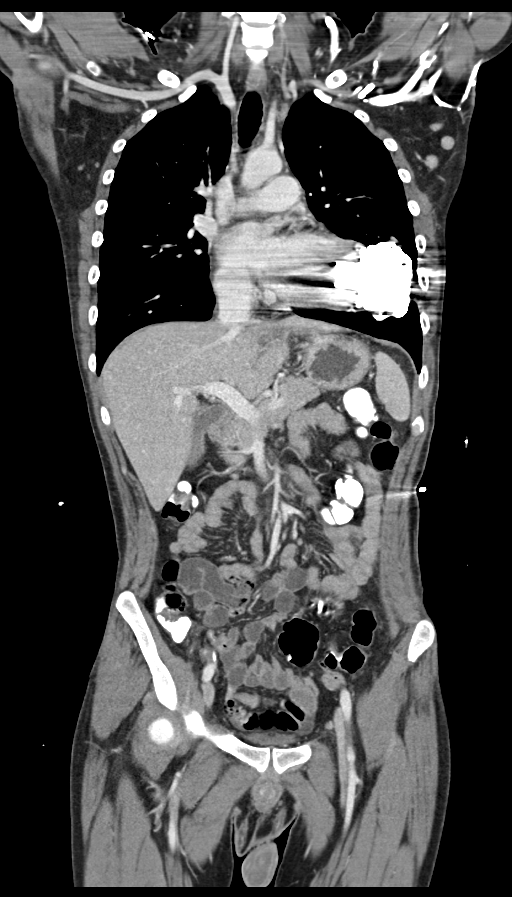
[im 91/151  mediastinal]
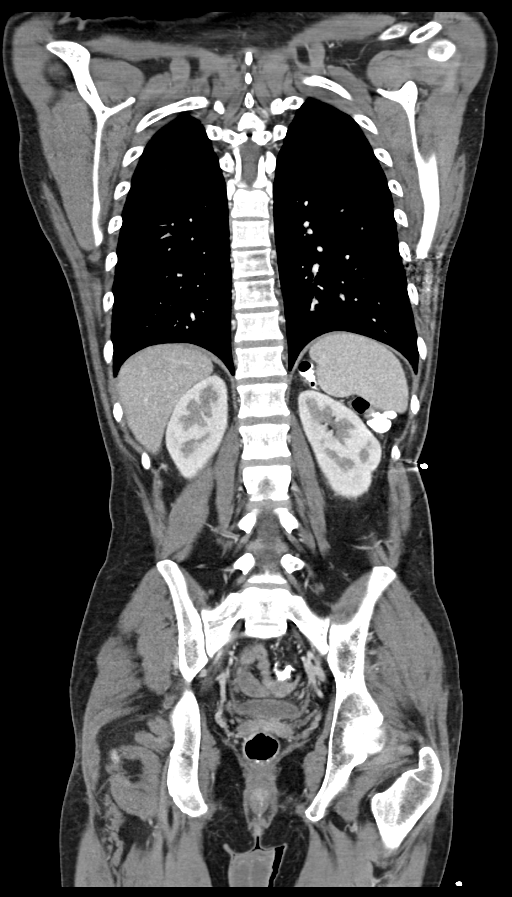

[13 of 36 positions shown; findings below may reference images not displayed]

RADIATION DOSE REDUCTION: This exam was performed according to the
departmental dose-optimization program which includes automated
exposure control, adjustment of the mA and/or kV according to
patient size and/or use of iterative reconstruction technique.

CONTRAST:  100mL OMNIPAQUE IOHEXOL 350 MG/ML SOLN
FINDINGS: CT CHEST FINDINGS

Cardiovascular: There is left ventricular assist pump adjacent to
the apex of the left ventricle.

Mediastinum/Nodes: No significant lymphadenopathy seen in the
mediastinum. There are few slightly enlarged lymph nodes in both
axillary regions largest measuring 12 mm in short axis.

Lungs/Pleura: There is no focal pulmonary consolidation. There are
no discrete noncalcified lung nodules. Beam hardening artifacts
caused by left ventricular assist device limit evaluation of
adjacent lung fields. There is no pleural effusion or pneumothorax.

CT ABDOMEN PELVIS FINDINGS

Hepatobiliary: Liver measures 15.2 cm in length. No focal
abnormality is seen. Gallbladder is unremarkable.

Pancreas: No focal abnormality is seen.

Spleen: Unremarkable.

Adrenals/Urinary Tract: Adrenals are not enlarged. There is no
hydronephrosis. There are no renal or ureteral stones. Urinary
bladder is not distended.

Stomach/Bowel: There is mild diffuse wall thickening in the fundus
of the stomach. Small bowel loops are not dilated. Appendix is not
dilated. There is no significant wall thickening in colon. There is
no pericolic stranding.

Vascular/Lymphatic: Vascular structures are unremarkable. No new
significant lymphadenopathy seen.

Reproductive: Unremarkable.

Other: There is no ascites or pneumoperitoneum. There is stranding
in the fat planes adjacent to a lead attached to the left
ventricular assist device slightly superior to the umbilicus. There
is skin thickening. There is tiny pocket of air in the skin. There
is no demonstrable thick-walled loculated fluid collection.

Musculoskeletal: Metallic sutures are seen in the sternum. Bony
structures are otherwise unremarkable.
IMPRESSION: There is skin thickening and subcutaneous stranding in the anterior
abdominal wall slightly above the umbilicus adjacent to lead
extending to the left ventricular assist device. Findings suggest
possible cellulitis. There is no demonstrable loculated fluid
collection in this region.

There is no focal pulmonary consolidation. There is no pleural
effusion. No significant lymphadenopathy seen in mediastinum.

There is no evidence of intestinal obstruction or pneumoperitoneum.
Appendix is not dilated. There is no hydronephrosis. There are no
abnormal loculated fluid collections in the abdomen and pelvis.

There is diffuse wall thickening in the fundus of the stomach which
may be due to incomplete distention or gastritis or neoplastic
process.

## 2022-05-02 ENCOUNTER — Other Ambulatory Visit (HOSPITAL_COMMUNITY): Payer: Self-pay | Admitting: Unknown Physician Specialty

## 2022-05-02 DIAGNOSIS — Z95811 Presence of heart assist device: Secondary | ICD-10-CM

## 2022-05-03 ENCOUNTER — Encounter (HOSPITAL_COMMUNITY): Payer: Self-pay | Admitting: Internal Medicine

## 2022-05-03 ENCOUNTER — Ambulatory Visit (HOSPITAL_COMMUNITY)
Admission: RE | Admit: 2022-05-03 | Discharge: 2022-05-03 | Disposition: A | Discharge status: Home / Self Care | Payer: Medicaid Other | Source: Ambulatory Visit | Attending: Internal Medicine | Admitting: Internal Medicine

## 2022-05-03 VITALS — BP 104/62 | HR 94 | Wt 115.2 lb

## 2022-05-03 DIAGNOSIS — Z95811 Presence of heart assist device: Secondary | ICD-10-CM | POA: Diagnosis present

## 2022-05-03 DIAGNOSIS — Z4801 Encounter for change or removal of surgical wound dressing: Secondary | ICD-10-CM | POA: Diagnosis not present

## 2022-05-03 DIAGNOSIS — I5022 Chronic systolic (congestive) heart failure: Secondary | ICD-10-CM | POA: Diagnosis not present

## 2022-05-03 DIAGNOSIS — T827XXA Infection and inflammatory reaction due to other cardiac and vascular devices, implants and grafts, initial encounter: Secondary | ICD-10-CM

## 2022-05-03 DIAGNOSIS — R7401 Elevation of levels of liver transaminase levels: Secondary | ICD-10-CM | POA: Diagnosis not present

## 2022-05-03 LAB — LACTATE DEHYDROGENASE: LDH: 178 U/L (ref 98–192)

## 2022-05-03 LAB — CBC
HCT: 35.6 % — ABNORMAL LOW (ref 39.0–52.0)
Hemoglobin: 11.8 g/dL — ABNORMAL LOW (ref 13.0–17.0)
MCH: 33 pg (ref 26.0–34.0)
MCHC: 33.1 g/dL (ref 30.0–36.0)
MCV: 99.4 fL (ref 80.0–100.0)
Platelets: 120 10*3/uL — ABNORMAL LOW (ref 150–400)
RBC: 3.58 MIL/uL — ABNORMAL LOW (ref 4.22–5.81)
RDW: 15.2 % (ref 11.5–15.5)
WBC: 3.8 10*3/uL — ABNORMAL LOW (ref 4.0–10.5)
nRBC: 0 % (ref 0.0–0.2)

## 2022-05-03 LAB — BASIC METABOLIC PANEL
Anion gap: 10 (ref 5–15)
BUN: <5 mg/dL — ABNORMAL LOW (ref 6–20)
CO2: 25 mmol/L (ref 22–32)
Calcium: 9.3 mg/dL (ref 8.9–10.3)
Chloride: 106 mmol/L (ref 98–111)
Creatinine, Ser: 0.84 mg/dL (ref 0.61–1.24)
GFR, Estimated: >60 mL/min (ref >60)
Glucose, Bld: 96 mg/dL (ref 70–99)
Potassium: 3.9 mmol/L (ref 3.5–5.1)
Sodium: 141 mmol/L (ref 135–145)

## 2022-05-03 NOTE — Patient Instructions (Signed)
No medication changes Return to clinic in 2 months to see Dr Haroldine Laws

## 2022-05-03 NOTE — Progress Notes (Signed)
Patient presents for 2 mo f/u in VAD clinic today alone.  Denies issues with VAD equipment or drive line.    States he has been feeling great over the last few months. He is eating well, and staying hydrated. Wt up 6 lbs today. Denies lightheadedness, dizziness, falls, shortness of breath, heart failure symptoms, and signs of bleeding.   Pt confirms that he is taking his antibiotics twice daily and Zinc and Potassium as prescribed. He brought all meds to clinic today.   Dressing changed in clinic today - see below for full documentation. Pt tells me that he is doing his own dressing change every week using weekly kit. Advised he must call VAD coordinators for any drainage, redness, tenderness, or foul odor. He verbalized understanding.  Patient taking Cefadroxil to 500 mg BID for lifelong suppression for DL.   Pt had a small tear in his driveline that the wires were exposed. This area was repaired with rescue tape.   Vital Signs:  Doppler Pressure: 84 Automatc BP: 104/62 (79) HR: 94 SPO2: UTO% on RA   Weight: 115.2 lb w/o eqt Last clinic weight: 109.8 lb w/o eqt   VAD Indication: Destination Therapy LVAD due to narcotic use   LVAD assessment: HM III: Speed:  5400 rpms                                 Flow: 3.8      Power: 3.9 w                                              PI: 6.5      Hct: 20 -- do not titrate                                  Alarms: few low voltage advisories Events: 70+ daily  Fixed speed: 5400 Low speed limit: 5100  Primary Controller: Replace back up battery in 11 months Back up controller:   Replace back up battery in 25 months   I reviewed the LVAD parameters from today and compared the results to the patient's prior recorded data. LVAD interrogation was NEGATIVE for significant power changes, NEGATIVE for clinical alarms, and POSITIVE for PI events/speed drops. No programming changes were made and pump is functioning within specified parameters. Pt is  performing daily controller and system monitor self tests along with completing weekly and monthly maintenance for LVAD equipment.   LVAD equipment check completed and is in good working order. Back-up equipment present.   Annual Equipment Maintenance on UBC/PM was performed on 02/22/22.  Exit Site Care: Existing VAD dressing removed and site care performed using sterile technique. Drive line exit site cleaned with Chlora prep applicators x 2, allowed to dry, and Silverlon dressing w/sorbaview placed. Exit site healing and incorporated, the velour is exposed around 2" at exit site. No drainage redness, tenderness, or rash noted. Dry skin noted under previous anchor site. Drive line anchor re-applied. Pt denies fever or chills. Continue weekly dressing changes using the weekly kit. Change back to daily kits if driveline starts to drain. Provided with 12 weekly kits, 10 extra anchors, and 2 boxes of large tegaderms for home use.      Device: N/A  BP & Labs:  Doppler BP 84 - Doppler is reflecting MAP   Hgb 11.8 -  No S/S of bleeding. Specifically denies melena/BRBPR or nosebleeds.   LDH 178 - with established baseline of 250 - 430. Denies tea-colored urine. No power elevations noted on interrogation.     Patient Instructions: No medication changes Return to clinic in 2 months to see Dr Nash Shearer RN [place] Coordinator  Office: [phone]  24/7 Pager: [phone]

## 2022-05-12 NOTE — Progress Notes (Signed)
VAD CLINIC NOTE  HPI:  [name] is a 37 y/o male from [place]  w/ h/o severe systolic HF due to NICM EF 15% and prior polysubstance abuse (cocaine and ETOH)   Underwent HM-3 LVAD placement on 02/26/19 by Dr. Morton Peters. Required milrinone post op for RV support.  Admitted 6/21 for AKI and hypotension in setting of volume depletion due to GI bug.Had multiple flow alarms. Echo EF 40-45%   Admitted 12/21 for anemia in setting of supratherapeutic INR (8.0) and bleeding from a sore on his lip. Transfused but did not bleed any further. Developed refractory low flow alarms on VAD with drop in platelets and rising LDH. RUQ U/S was normal (doubt splenic sequestration). Peripheral smear showed no evidence of hemolysis.Patient placed on his left side and low flow alarms resolved. It was suspected possible obstruction/clot of outflow graft causing Plts and Hgb to drop. CT 12/10 showed no evidence of outflow graft kink/clot.  LDH trended back down to his baseline, below 200s. Plts started to trend back up.  Admitted 3/22 for MSSA DL infection requiring surgical debridement. D/c'd to [facility] for IV abx.    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 Wound cx grew MSSA. Treated with cefazolin. Discharged home on 03/16/21 with wound vac. ID recommended oral linezolid BID for 3 weeks at discharge then switch to chronic cefadroxil afterwards.  Here for routine f/u. Doing  ery well. Family watching out for him and making sure he eats enough. Doing his own DL changes. Denies orthopnea or PND. No fevers, chills or problems with driveline. No bleeding, melena or neuro symptoms. No VAD alarms. Taking all meds as prescribed.     VAD Indication: Destination Therapy LVAD due to narcotic use   LVAD assessment: HM III: Speed:  5400 rpms                                 Flow: 3.8                                    Power: 3.9 w                                              PI: 6.5        Hct: 20 -- do not titrate                                  Alarms: few low voltage advisories Events: 70+ daily  Fixed speed: 5400 Low speed limit: 5100   Primary Controller: Replace back up battery in 11 months Back up controller:   Replace back up battery in 25 months   I reviewed the LVAD parameters from today and compared the results to the patient's prior recorded data. LVAD interrogation was NEGATIVE for significant power changes, NEGATIVE for clinical alarms, and POSITIVE for PI events/speed drops. No programming changes were made and pump is functioning within specified parameters. Pt is performing daily controller and system monitor self tests along with completing weekly and monthly maintenance for LVAD equipment.   LVAD equipment check completed and  is in good working order. Back-up equipment present.    Annual Equipment Maintenance on UBC/PM was performed on 02/22/22.     Past Medical History:  Diagnosis Date   Dilated cardiomyopathy (HCC)    Dilated cardiomyopathy (HCC) 02/2019   Polysubstance abuse (HCC)     Current Outpatient Medications  Medication Sig Dispense Refill   cefadroxil (DURICEF) 500 MG capsule Take 1 capsule (500 mg total) by mouth 2 (two) times daily. 60 capsule 11   KLOR-CON M20 20 MEQ tablet TAKE 2 TABLETS BY MOUTH DAILY,OR AS DIRECTED BY HEART FAILURE CLINIC 180 tablet 1   Zinc Sulfate 220 (50 Zn) MG TABS Take 1 tablet (220 mg total) by mouth daily. 90 tablet 3   No current facility-administered medications for this encounter.    Patient has no known allergies.   Vital Signs:  Doppler Pressure: 84 Automatc BP: 104/62 (79) HR: 94 SPO2: UTO% on RA   Weight: 115.2 lb w/o eqt Last clinic weight: 109.8 lb w/o eq  Physical Exam: General:  NAD.  HEENT: normal  Neck: supple. JVP not elevated.  Carotids 2+ bilat; no bruits. No lymphadenopathy or thryomegaly appreciated. Cor: LVAD hum.  Lungs: Clear. Abdomen: soft, nontender,  non-distended. No hepatosplenomegaly. No bruits or masses. Good bowel sounds. Driveline site clean. Anchor in place.  Extremities: no cyanosis, clubbing, rash. Warm no edema  Neuro: alert & oriented x 3. No focal deficits. Moves all 4 without problem    ASSESSMENT AND PLAN:   1. Recurrent MSSA DL infection - Initial episode 3/22 - Recurrent infection 2/23 requiring debridement and wound vac.  - Site now healed - Continue lifelong cefadroxil. He has been complaint with his abx. - - Site looks good today  2. Chronic systolic HF due to severe NICM - Echo EF 10% with biventricular failure - HM-3 VAD implant 02/26/19 - Echo 7/21 EF 40-45% - Doing well. NYHA I - MAPs ok  3. VAD - Admit 12/21 with multiple low flow alarms. Rising LDH and decreasing PLTs. Suspected outflow graft kink occlusion. Patient placed on his left side and resolved.  - VAD interrogated personally. Parameters stable. - DL site ok(he is doing his own dressing changes) - He had a tear in DL cable which we repaired today. - Hgb  11.8 (no s/s of bleeding) - LDH 178 - Remains off warfarin due to elevated labile INRs.  4. HTN - MAPs ok  5. Elevated LFTs/thrombocytopenia - suspect related to cefuroxil - Trending down - RUQ u/s 8/23 ok. Hepatitis panels negative  6. AKI, resolved - Scr 0.9 -> 1.4 -> 0.84 today  Total time spent 40 minutes. Over half that time spent discussing above.   Arvilla Meres, MD  3:52 PM

## 2022-07-03 ENCOUNTER — Other Ambulatory Visit (HOSPITAL_COMMUNITY): Payer: Self-pay | Admitting: *Deleted

## 2022-07-03 DIAGNOSIS — I5022 Chronic systolic (congestive) heart failure: Secondary | ICD-10-CM

## 2022-07-03 DIAGNOSIS — Z95811 Presence of heart assist device: Secondary | ICD-10-CM

## 2022-07-04 ENCOUNTER — Encounter (HOSPITAL_COMMUNITY): Payer: Self-pay | Admitting: Internal Medicine

## 2022-07-04 ENCOUNTER — Ambulatory Visit (HOSPITAL_COMMUNITY)
Admission: RE | Admit: 2022-07-04 | Discharge: 2022-07-04 | Disposition: A | Discharge status: Home / Self Care | Payer: Medicaid Other | Source: Ambulatory Visit | Attending: Internal Medicine | Admitting: Internal Medicine

## 2022-07-04 VITALS — BP 107/56 | HR 89 | Temp 98.2°F | Ht 68.0 in | Wt 112.4 lb

## 2022-07-04 DIAGNOSIS — T827XXA Infection and inflammatory reaction due to other cardiac and vascular devices, implants and grafts, initial encounter: Secondary | ICD-10-CM

## 2022-07-04 DIAGNOSIS — E46 Unspecified protein-calorie malnutrition: Secondary | ICD-10-CM | POA: Insufficient documentation

## 2022-07-04 DIAGNOSIS — Z792 Long term (current) use of antibiotics: Secondary | ICD-10-CM | POA: Insufficient documentation

## 2022-07-04 DIAGNOSIS — Z95811 Presence of heart assist device: Secondary | ICD-10-CM | POA: Diagnosis not present

## 2022-07-04 DIAGNOSIS — Z452 Encounter for adjustment and management of vascular access device: Secondary | ICD-10-CM | POA: Diagnosis not present

## 2022-07-04 DIAGNOSIS — E440 Moderate protein-calorie malnutrition: Secondary | ICD-10-CM | POA: Diagnosis not present

## 2022-07-04 DIAGNOSIS — B9561 Methicillin susceptible Staphylococcus aureus infection as the cause of diseases classified elsewhere: Secondary | ICD-10-CM | POA: Insufficient documentation

## 2022-07-04 DIAGNOSIS — I420 Dilated cardiomyopathy: Secondary | ICD-10-CM | POA: Diagnosis not present

## 2022-07-04 DIAGNOSIS — R634 Abnormal weight loss: Secondary | ICD-10-CM | POA: Diagnosis not present

## 2022-07-04 DIAGNOSIS — Z79899 Other long term (current) drug therapy: Secondary | ICD-10-CM | POA: Diagnosis not present

## 2022-07-04 DIAGNOSIS — R945 Abnormal results of liver function studies: Secondary | ICD-10-CM | POA: Diagnosis not present

## 2022-07-04 DIAGNOSIS — I110 Hypertensive heart disease with heart failure: Secondary | ICD-10-CM | POA: Diagnosis not present

## 2022-07-04 DIAGNOSIS — F1090 Alcohol use, unspecified, uncomplicated: Secondary | ICD-10-CM | POA: Diagnosis not present

## 2022-07-04 DIAGNOSIS — D696 Thrombocytopenia, unspecified: Secondary | ICD-10-CM | POA: Diagnosis not present

## 2022-07-04 DIAGNOSIS — Y712 Prosthetic and other implants, materials and accessory cardiovascular devices associated with adverse incidents: Secondary | ICD-10-CM | POA: Diagnosis not present

## 2022-07-04 DIAGNOSIS — T827XXD Infection and inflammatory reaction due to other cardiac and vascular devices, implants and grafts, subsequent encounter: Secondary | ICD-10-CM | POA: Diagnosis not present

## 2022-07-04 DIAGNOSIS — F1411 Cocaine abuse, in remission: Secondary | ICD-10-CM | POA: Diagnosis not present

## 2022-07-04 DIAGNOSIS — I5022 Chronic systolic (congestive) heart failure: Secondary | ICD-10-CM | POA: Insufficient documentation

## 2022-07-04 DIAGNOSIS — I10 Essential (primary) hypertension: Secondary | ICD-10-CM

## 2022-07-04 DIAGNOSIS — I5082 Biventricular heart failure: Secondary | ICD-10-CM | POA: Insufficient documentation

## 2022-07-04 LAB — BASIC METABOLIC PANEL
Anion gap: 9 (ref 5–15)
BUN: <5 mg/dL — ABNORMAL LOW (ref 6–20)
CO2: 23 mmol/L (ref 22–32)
Calcium: 9 mg/dL (ref 8.9–10.3)
Chloride: 107 mmol/L (ref 98–111)
Creatinine, Ser: 0.84 mg/dL (ref 0.61–1.24)
GFR, Estimated: >60 mL/min (ref >60)
Glucose, Bld: 107 mg/dL — ABNORMAL HIGH (ref 70–99)
Potassium: 3.3 mmol/L — ABNORMAL LOW (ref 3.5–5.1)
Sodium: 139 mmol/L (ref 135–145)

## 2022-07-04 LAB — CBC
HCT: 34.3 % — ABNORMAL LOW (ref 39.0–52.0)
Hemoglobin: 11.7 g/dL — ABNORMAL LOW (ref 13.0–17.0)
MCH: 33.9 pg (ref 26.0–34.0)
MCHC: 34.1 g/dL (ref 30.0–36.0)
MCV: 99.4 fL (ref 80.0–100.0)
Platelets: 119 10*3/uL — ABNORMAL LOW (ref 150–400)
RBC: 3.45 MIL/uL — ABNORMAL LOW (ref 4.22–5.81)
RDW: 14.8 % (ref 11.5–15.5)
WBC: 4.2 10*3/uL (ref 4.0–10.5)
nRBC: 0 % (ref 0.0–0.2)

## 2022-07-04 LAB — T4, FREE: Free T4: 0.69 ng/dL (ref 0.61–1.12)

## 2022-07-04 LAB — LACTATE DEHYDROGENASE: LDH: 188 U/L (ref 98–192)

## 2022-07-04 LAB — TSH: TSH: 2.569 u[IU]/mL (ref 0.350–4.500)

## 2022-07-04 NOTE — Progress Notes (Signed)
H&V Care Navigation CSW Progress Note  Clinical Social Worker met with patient to follow up for supportive needs.  Patient is participating in a Managed Medicaid Plan:  Yes Out of [place].  CSW met with patient in the clinic. Patient reports all is well at home. He resides with his older brother in their family home. He states that often his nieces and nephews come by to visit and spend the night so "it's a busy house". Patient was in good spirits today and shared stories about [place]. He states he continues to use the Medicaid transportation service to get to and from his medical appointments with no concerns. He denies any concerns although did admit that he could use some food. Patient has lost some weight from last visit and states "I need to eat some sweets to fatten up". CSW provided a heart healthy food bag with low sodium options. Patient is grateful for the team support and will reach out if further needs arise. Lasandra Beech, LCSW, CCSW-MCS [phone]   SDOH Screenings   Food Insecurity: Food Insecurity Present (07/04/2022)  Housing: Low Risk  (07/04/2022)  Transportation Needs: No Transportation Needs (07/04/2022)  Utilities: Not At Risk (07/04/2022)  Alcohol Screen: Medium Risk (03/05/2019)  Depression (PHQ2-9): Low Risk  (02/22/2019)  Financial Resource Strain: High Risk (07/04/2022)  Physical Activity: Sufficiently Active (03/05/2019)  Tobacco Use: Medium Risk (07/04/2022)

## 2022-07-04 NOTE — Patient Instructions (Signed)
No change in medications. Drink more fluid daily. Return to VAD Clinic in 2 months with 3.5 yr Intermacs.  Labs drawn today - will call you if abnormal.

## 2022-07-04 NOTE — Progress Notes (Signed)
Patient presents for 2 mo f/u in VAD clinic today alone.  Denies issues with VAD equipment or drive line.    States he has been feeling great over the last few months.  Denies lightheadedness, dizziness, falls, shortness of breath, heart failure symptoms, and signs of bleeding.   Wt down 3 lbs today. Lasandra Beech, LCSW in to speak with patient. Provided him with food from food pantry for home use. Dr. Gala Romney counseled patient to increase his fluid intake daily.   Pt confirms that he is taking his antibiotics twice daily and Zinc and Potassium as prescribed. He brought all meds to clinic today.   Dressing changed in clinic today - see below for full documentation. Pt tells me that he is doing his own dressing change every week using weekly kit. Advised he must call VAD coordinators for any drainage, redness, tenderness, or foul odor. He verbalized understanding.  Pt had a multiple bends on his driveline along with occasional elevated PI's (max 10.6). Pt does report he woke at 2:30 this am vomiting and has not drank anything since. Event log downloaded and sent to [employer] with no issues noted other than these events occurred while patient on batteries. Reminded patient to sleep on MPU and not on his batteries, pt verbalized understanding of same.   Modular cable replaced with SN [id number]; exp 01/14/25 due to excessive bends and kinks in present cable.    Vital Signs:  Temp: 98.2 Doppler Pressure: 94 Automatc BP: 107/56 (83) HR: 89 SPO2: 100% on RA   Weight: 112.4 lb w/o eqt Last clinic weight: 115.2 lb w/o eqt   VAD Indication: Destination Therapy LVAD due to narcotic use   LVAD assessment: HM III: Speed:  5400 rpms                                 Flow: 3.6      Power: 4.0 w                                              PI: 7.4  Hct: 20 -- do not titrate                                   Alarms: few low voltage advisories Events: 50+ daily  Fixed speed: 5400 Low speed limit:  5100  Primary Controller: Replace back up battery in 9 months Back up controller:   Replace back up battery in 18 months   I reviewed the LVAD parameters from today and compared the results to the patient's prior recorded data. LVAD interrogation was NEGATIVE for significant power changes, NEGATIVE for clinical alarms, and POSITIVE for PI events/speed drops. No programming changes were made and pump is functioning within specified parameters. Pt is performing daily controller and system monitor self tests along with completing weekly and monthly maintenance for LVAD equipment.   LVAD equipment check completed and is in good working order. Back-up equipment present.   Annual Equipment Maintenance on UBC/PM was performed on 02/22/22.  Exit Site Care: Existing VAD dressing removed and site care performed using sterile technique. Drive line exit site cleaned with Chlora prep applicators x 2, allowed to dry, and Silverlon dressing w/sorbaview placed. Exit site healing and  incorporated, the velour is exposed around 2" at exit site. No drainage redness, tenderness, or rash noted. Dry skin noted under previous anchor site. Drive line anchor re-applied. Pt denies fever or chills. Continue weekly dressing changes using the weekly kit. Change back to daily kits if driveline starts to drain. Provided with 8 weekly kits and 1 box of large tegaderms for home use.    Device: N/A  BP & Labs:  Doppler BP 94 - Doppler is reflecting MAP   Hgb 11.7 -  No S/S of bleeding. Specifically denies melena/BRBPR or nosebleeds.   LDH 188 - with established baseline of 250 - 430. Denies tea-colored urine. No power elevations noted on interrogation.     Patient Instructions: No change in medications. Drink more fluid daily. Return to VAD Clinic in 2 months with 3.5 yr Intermacs.  Labs drawn today - will call you if abnormal.   [name] RN VAD Coordinator  Office: [phone]  24/7 Pager: [phone]

## 2022-07-07 NOTE — Progress Notes (Signed)
VAD CLINIC NOTE  HPI:  [name] is a 37 y/o male from [place]  w/ h/o severe systolic HF due to NICM EF 15% and prior polysubstance abuse (cocaine and ETOH)   Underwent HM-3 LVAD placement on 02/26/19 by Dr. Morton Peters. Required milrinone post op for RV support.  Admitted 6/21 for AKI and hypotension in setting of volume depletion due to GI bug.Had multiple flow alarms. Echo EF 40-45%   Admitted 12/21 for anemia in setting of supratherapeutic INR (8.0) and bleeding from a sore on his lip. Transfused but did not bleed any further. Developed refractory low flow alarms on VAD with drop in platelets and rising LDH. RUQ U/S was normal (doubt splenic sequestration). Peripheral smear showed no evidence of hemolysis.Patient placed on his left side and low flow alarms resolved. It was suspected possible obstruction/clot of outflow graft causing Plts and Hgb to drop. CT 12/10 showed no evidence of outflow graft kink/clot.  LDH trended back down to his baseline, below 200s. Plts started to trend back up.  Admitted 3/22 for MSSA DL infection requiring surgical debridement. D/c'd to [facility] for IV abx.    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 Wound cx grew MSSA. Treated with cefazolin. Discharged home on 03/16/21 with wound vac. ID recommended oral linezolid BID for 3 weeks at discharge then switch to chronic cefadroxil afterwards.  Here for routine f/u. Doing fairly well. Living with family. Does his own dressing changes. Taking abx 2x/day. Says he is eating enough but has lost 3 pounds. Occasional n/v. Denies orthopnea or PND. No fevers, chills or problems with driveline. No bleeding, melena or neuro symptoms. No VAD alarms. Taking all meds as prescribed.    VAD Indication: Destination Therapy LVAD due to ETOH use andlack of social resources   LVAD assessment: HM III: Speed:  5400 rpms                                 Flow: 3.6                                     Power: 4.0 w                                              PI: 7.4   Hct: 20 -- do not titrate                                   Alarms: few low voltage advisories Events: 50+ daily  Fixed speed: 5400 Low speed limit: 5100   Primary Controller: Replace back up battery in 9 months Back up controller:   Replace back up battery in 18 months   I reviewed the LVAD parameters from today and compared the results to the patient's prior recorded data. LVAD interrogation was NEGATIVE for significant power changes, NEGATIVE for clinical alarms, and POSITIVE for PI events/speed drops. No programming changes were made and pump is functioning within specified parameters. Pt is performing daily controller and system monitor self tests along with completing weekly and monthly maintenance for LVAD equipment.  LVAD equipment check completed and is in good working order. Back-up equipment present.    Annual Equipment Maintenance on UBC/PM was performed on 02/22/22.     Past Medical History:  Diagnosis Date   Dilated cardiomyopathy (HCC)    Dilated cardiomyopathy (HCC) 02/2019   Polysubstance abuse (HCC)     Current Outpatient Medications  Medication Sig Dispense Refill   cefadroxil (DURICEF) 500 MG capsule Take 1 capsule (500 mg total) by mouth 2 (two) times daily. 60 capsule 11   KLOR-CON M20 20 MEQ tablet TAKE 2 TABLETS BY MOUTH DAILY,OR AS DIRECTED BY HEART FAILURE CLINIC 180 tablet 1   Zinc Sulfate 220 (50 Zn) MG TABS Take 1 tablet (220 mg total) by mouth daily. 90 tablet 3   No current facility-administered medications for this encounter.    Patient has no known allergies.     Vital Signs:  Temp: 98.2 Doppler Pressure: 94 Automatc BP: 107/56 (83) HR: 89 SPO2: 100% on RA   Weight: 112.4 lb w/o eqt Last clinic weight: 115.2 lb w/o eqt  Physical Exam: General:  Thin NAD.  HEENT: normal  Neck: supple. JVP not elevated.  Carotids 2+ bilat; no bruits. No lymphadenopathy or  thryomegaly appreciated. Cor: LVAD hum.  Lungs: Clear. Abdomen: soft, nontender, non-distended. No hepatosplenomegaly. No bruits or masses. Good bowel sounds. Driveline site clean. Anchor in place.  DL cable with multiple bends and kinks in it Extremities: no cyanosis, clubbing, rash. Warm no edema  Neuro: alert & oriented x 3. No focal deficits. Moves all 4 without problem   ASSESSMENT AND PLAN:   1. Recurrent MSSA DL infection - Initial episode 3/22 - Recurrent infection 2/23 requiring debridement and wound vac.  - Site now healed - Has been compliant with abx. Site looks good  2. Chronic systolic HF due to severe NICM - Echo EF 10% with biventricular failure - HM-3 VAD implant 02/26/19 - Echo 7/21 EF 40-45% - Stable NYHA I - Volume ok  - MAPs ok  3. VAD - Admit 12/21 with multiple low flow alarms. Rising LDH and decreasing PLTs. Suspected outflow graft kink occlusion. Patient placed on his left side and resolved.  - VAD interrogated personally. Parameters stable. Has had several low voltage advisories.Reminded not to sleep on batteries - DL site ok (he is doing his own dressing changes) - He had multiple kinks in modular DL cable. We changed in clinic today - Hgb  stable 11.8 -> 11.7 (no s/s of bleeding) - LDH 188 - Remains off warfarin due to elevated labile INRs.  4. HTN - MAPs ok  5. Elevated LFTs/thrombocytopenia - suspect related to cefuroxil - Trending down - RUQ u/s 8/23 ok. Hepatitis panels negative  6. Weight loss -> protein-calorie malnutrition - has struggled with poor diet since his mother passed - met with SW in clinic today and we provided him food and resources  Total time spent 45 minutes. Over half that time spent discussing above.   Arvilla Meres, MD  10:20 PM

## 2022-09-11 ENCOUNTER — Other Ambulatory Visit: Payer: Self-pay | Admitting: Infectious Diseases

## 2022-09-11 ENCOUNTER — Other Ambulatory Visit (HOSPITAL_COMMUNITY): Payer: Self-pay | Admitting: Family Medicine

## 2022-09-11 DIAGNOSIS — Z7901 Long term (current) use of anticoagulants: Secondary | ICD-10-CM

## 2022-09-11 DIAGNOSIS — T827XXA Infection and inflammatory reaction due to other cardiac and vascular devices, implants and grafts, initial encounter: Secondary | ICD-10-CM

## 2022-09-11 DIAGNOSIS — Z95811 Presence of heart assist device: Secondary | ICD-10-CM

## 2022-09-11 DIAGNOSIS — I5022 Chronic systolic (congestive) heart failure: Secondary | ICD-10-CM

## 2022-09-24 ENCOUNTER — Other Ambulatory Visit (HOSPITAL_COMMUNITY): Payer: Self-pay | Admitting: Unknown Physician Specialty

## 2022-09-24 DIAGNOSIS — Z95811 Presence of heart assist device: Secondary | ICD-10-CM

## 2022-09-25 ENCOUNTER — Ambulatory Visit (HOSPITAL_COMMUNITY)
Admission: RE | Admit: 2022-09-25 | Discharge: 2022-09-25 | Disposition: A | Discharge status: Home / Self Care | Payer: Medicaid Other | Source: Ambulatory Visit | Attending: Internal Medicine | Admitting: Internal Medicine

## 2022-09-25 ENCOUNTER — Encounter (HOSPITAL_COMMUNITY): Payer: Self-pay | Admitting: Internal Medicine

## 2022-09-25 VITALS — BP 111/78 | HR 111 | Ht 68.0 in | Wt 102.2 lb

## 2022-09-25 DIAGNOSIS — T827XXA Infection and inflammatory reaction due to other cardiac and vascular devices, implants and grafts, initial encounter: Secondary | ICD-10-CM | POA: Diagnosis not present

## 2022-09-25 DIAGNOSIS — I5022 Chronic systolic (congestive) heart failure: Secondary | ICD-10-CM | POA: Diagnosis present

## 2022-09-25 DIAGNOSIS — R945 Abnormal results of liver function studies: Secondary | ICD-10-CM | POA: Insufficient documentation

## 2022-09-25 DIAGNOSIS — Z681 Body mass index (BMI) 19 or less, adult: Secondary | ICD-10-CM | POA: Diagnosis not present

## 2022-09-25 DIAGNOSIS — Z95811 Presence of heart assist device: Secondary | ICD-10-CM | POA: Diagnosis not present

## 2022-09-25 DIAGNOSIS — I110 Hypertensive heart disease with heart failure: Secondary | ICD-10-CM | POA: Insufficient documentation

## 2022-09-25 DIAGNOSIS — Z22321 Carrier or suspected carrier of Methicillin susceptible Staphylococcus aureus: Secondary | ICD-10-CM | POA: Diagnosis not present

## 2022-09-25 DIAGNOSIS — E440 Moderate protein-calorie malnutrition: Secondary | ICD-10-CM

## 2022-09-25 DIAGNOSIS — E46 Unspecified protein-calorie malnutrition: Secondary | ICD-10-CM | POA: Diagnosis not present

## 2022-09-25 DIAGNOSIS — I428 Other cardiomyopathies: Secondary | ICD-10-CM | POA: Insufficient documentation

## 2022-09-25 DIAGNOSIS — D696 Thrombocytopenia, unspecified: Secondary | ICD-10-CM | POA: Diagnosis not present

## 2022-09-25 DIAGNOSIS — R634 Abnormal weight loss: Secondary | ICD-10-CM

## 2022-09-25 DIAGNOSIS — R748 Abnormal levels of other serum enzymes: Secondary | ICD-10-CM

## 2022-09-25 LAB — COMPREHENSIVE METABOLIC PANEL
ALT: 27 U/L (ref 0–44)
AST: 33 U/L (ref 15–41)
Albumin: 4.7 g/dL (ref 3.5–5.0)
Alkaline Phosphatase: 72 U/L (ref 38–126)
Anion gap: 19 — ABNORMAL HIGH (ref 5–15)
BUN: 17 mg/dL (ref 6–20)
CO2: 19 mmol/L — ABNORMAL LOW (ref 22–32)
Calcium: 10.8 mg/dL — ABNORMAL HIGH (ref 8.9–10.3)
Chloride: 104 mmol/L (ref 98–111)
Creatinine, Ser: 1.6 mg/dL — ABNORMAL HIGH (ref 0.61–1.24)
GFR, Estimated: 57 mL/min — ABNORMAL LOW (ref >60)
Glucose, Bld: 127 mg/dL — ABNORMAL HIGH (ref 70–99)
Potassium: 3.6 mmol/L (ref 3.5–5.1)
Sodium: 142 mmol/L (ref 135–145)
Total Bilirubin: 2.1 mg/dL — ABNORMAL HIGH (ref 0.3–1.2)
Total Protein: 8.7 g/dL — ABNORMAL HIGH (ref 6.5–8.1)

## 2022-09-25 LAB — CBC
HCT: 42.7 % (ref 39.0–52.0)
Hemoglobin: 14.3 g/dL (ref 13.0–17.0)
MCH: 35.6 pg — ABNORMAL HIGH (ref 26.0–34.0)
MCHC: 33.5 g/dL (ref 30.0–36.0)
MCV: 106.2 fL — ABNORMAL HIGH (ref 80.0–100.0)
Platelets: 148 10*3/uL — ABNORMAL LOW (ref 150–400)
RBC: 4.02 MIL/uL — ABNORMAL LOW (ref 4.22–5.81)
RDW: 15.5 % (ref 11.5–15.5)
WBC: 5.1 10*3/uL (ref 4.0–10.5)
nRBC: 0.4 % — ABNORMAL HIGH (ref 0.0–0.2)

## 2022-09-25 LAB — LACTATE DEHYDROGENASE: LDH: 160 U/L (ref 98–192)

## 2022-09-25 LAB — PREALBUMIN: Prealbumin: 28 mg/dL (ref 18–38)

## 2022-09-25 MED ORDER — CEFADROXIL 500 MG PO CAPS: 500.0000 mg | ORAL_CAPSULE | Freq: Two times a day (BID) | ORAL | 11 refills | Status: DC

## 2022-09-25 NOTE — Patient Instructions (Signed)
No change in medications. Called your pharmacy and they will refill your antibiotic - please re-start today. Will ask Social Worker to send you case of Ensure, she will call you to discuss. Please answer your phone. Return to VAD Clinic in 2 months.

## 2022-09-25 NOTE — Progress Notes (Signed)
VAD CLINIC NOTE  HPI:  [name] is a 37 y/o male from [place]  w/ h/o severe systolic HF due to NICM EF 15% and prior polysubstance abuse (cocaine and ETOH)   Underwent HM-3 LVAD placement on 02/26/19 by Dr. Morton Peters. Required milrinone post op for RV support.  Admitted 6/21 for AKI and hypotension in setting of volume depletion due to GI bug.Had multiple flow alarms. Echo EF 40-45%   Admitted 12/21 for anemia in setting of supratherapeutic INR (8.0) and bleeding from a sore on his lip. Transfused but did not bleed any further. Developed refractory low flow alarms on VAD with drop in platelets and rising LDH. RUQ U/S was normal (doubt splenic sequestration). Peripheral smear showed no evidence of hemolysis.Patient placed on his left side and low flow alarms resolved. It was suspected possible obstruction/clot of outflow graft causing Plts and Hgb to drop. CT 12/10 showed no evidence of outflow graft kink/clot.  LDH trended back down to his baseline, below 200s. Plts started to trend back up.  Admitted 3/22 for MSSA DL infection requiring surgical debridement. D/c'd to [facility] for IV abx.    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 Wound cx grew MSSA. Treated with cefazolin. Discharged home on 03/16/21 with wound vac. ID recommended oral linezolid BID for 3 weeks at discharge then switch to chronic cefadroxil afterwards.  Here for routine f/u. Doing well. Says he is eating enough. Doing his own dressing changes. Denies orthopnea or PND. No fevers, chills or problems with driveline. No bleeding, melena or neuro symptoms. No VAD alarms. Taking all meds as prescribed except he ran ouf of cefadroxil over the weekend.    VAD Indication: Destination Therapy LVAD due to narcotic use   LVAD assessment: HM III: Speed:  5400 rpms                                 Flow: 3.2                                    Power: 4.0 w                                               PI: 9.7   Hct: 20 -- do not titrate                                   Alarms: none Events: > 60 last 48 hrs  Fixed speed: 5400 Low speed limit: 5100   Primary Controller: Replace back up battery in 6 months; replaced with [id number]; exp 03/18/24; 31 months Back up controller:   Replace back up battery in 15 months   I reviewed the LVAD parameters from today and compared the results to the patient's prior recorded data. LVAD interrogation was NEGATIVE for significant power changes, NEGATIVE for clinical alarms, and POSITIVE for PI events/speed drops. No programming changes were made and pump is functioning within specified parameters. Pt is performing daily controller and system monitor self tests along with completing weekly and monthly maintenance for LVAD equipment.   LVAD equipment check completed  and is in good working order. Back-up equipment present.    Annual Equipment Maintenance on UBC/PM was performed on 02/22/22.     Past Medical History:  Diagnosis Date   Dilated cardiomyopathy (HCC)    Dilated cardiomyopathy (HCC) 02/2019   Polysubstance abuse (HCC)     Current Outpatient Medications  Medication Sig Dispense Refill   KLOR-CON M20 20 MEQ tablet TAKE 2 TABLETS (40 MEQ) BY MOUTH DAILY, OR AS DIRECTED BY HEART FAILURE CLINIC 180 tablet 3   Zinc Sulfate 220 (50 Zn) MG TABS Take 1 tablet (220 mg total) by mouth daily. 90 tablet 3   cefadroxil (DURICEF) 500 MG capsule Take 1 capsule (500 mg total) by mouth 2 (two) times daily. (Patient not taking: Reported on 09/25/2022) 60 capsule 11   No current facility-administered medications for this encounter.    Patient has no known allergies.      Vital Signs:  Doppler Pressure: 90 Automatc BP: 111/78 (90) HR: 111 - Sinus tach SPO2: UTO   Weight: 102.2 lb w/o eqt Last clinic weight: 112.4 lb w/o eqt    Physical Exam: General:  Thin NAD.  HEENT: normal  Neck: supple. JVP not elevated.  Carotids 2+ bilat; no bruits.  No lymphadenopathy or thryomegaly appreciated. Cor: LVAD hum.  Lungs: Clear. Abdomen: soft, nontender, non-distended. No hepatosplenomegaly. No bruits or masses. Good bowel sounds. Driveline site clean. Anchor in place.  Extremities: no cyanosis, clubbing, rash. Warm no edema  Neuro: alert & oriented x 3. No focal deficits. Moves all 4 without problem    ASSESSMENT AND PLAN:   1. Recurrent MSSA DL infection - Initial episode 3/22 - Recurrent infection 2/23 requiring debridement and wound vac.  - Site now healed   - Refilled cefadroxil  2. Chronic systolic HF due to severe NICM - Echo EF 10% with biventricular failure - HM-3 VAD implant 02/26/19 - Echo 7/21 EF 40-45% - Stable NYHA I - Volume ok  - MAPs ok  3. VAD - Admit 12/21 with multiple low flow alarms. Rising LDH and decreasing PLTs. Suspected outflow graft kink occlusion. Patient placed on his left side and resolved.  - VAD interrogated personally. Parameters stable. - DL site ok as above (he is doing his own dressing changes) - Hgb  improved 11.8 -> 11.7-> 14.3 - LDH 160 - Remains off warfarin due to elevated labile INRs.  4. HTN - MAPs ok  5. Elevated LFTs/thrombocytopenia - suspect related to cefuroxil - Trending down - RUQ u/s 8/23 ok. Hepatitis panels negative  6. Weight loss -> protein-calorie malnutrition - has struggled with poor diet since his mother passed - weight continues to drop - I d/w SW. We will provide him with Ensure. Asked him to drink 2 per day.   Total time spent 45 minutes. Over half that time spent discussing above.    Arvilla Meres, MD  11:56 AM

## 2022-09-25 NOTE — Progress Notes (Signed)
Patient presents for 2 mo f/u in VAD clinic today alone.  Denies issues with VAD equipment or drive line.   Says he has had nausea and vomiting x 48 hours. Denies exposure to Covid or any other viral infections. Vomited @ 100 cc bile green emesis during visit. Pt has not take meds, eaten, or drank anything this am. He requested Ginger Ale - provided along with saltine crackers.  Pts' weight is down which he contributes to recent GI illness. He says he has a good appetite, has "plenty of food", and that his relatives bring food over "all the time". Dr. Gala Romney spoke with patient and encouraged weight of 110 lbs or greater. Rosetta Posner, CSW  will ship a case of Ensure to patient's home, she will call to discuss with [name] after clinic appointment. Advised patient to answer his phone; he verbalized agreement to same.   Pt confirms that he is taking his Zinc and Potassium as prescribed. He brought all meds to clinic today. He ran out of his antibiotic this past Saturday, saying Pharmacy has to speak with [place]. Called his local pharmacy, current Rx has expired. New Rx for Cefadroxil 500 mg bid sent to [facility] in [place]. Asked patient to re-start today; pt verbalized understanding of same.    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 LVAD interrogation was NEGATIVE for significant power changes, NEGATIVE for clinical alarms, and POSITIVE for PI events/speed drops. No programming changes were made and pump is functioning within specified parameters. Pt is performing daily controller and system monitor self tests along with completing weekly and monthly maintenance for LVAD equipment.   LVAD equipment check completed and is in good working order. Back-up equipment present.   Annual Equipment Maintenance on UBC/PM was performed on 02/22/22.  Exit Site Care: Existing weekly VAD dressing C/D/I with anchor intact and accurately applied. Patient is changing weekly and changed last night.  Pt denies any drainage, redness, tenderness at exit site. Continue weekly dressing changes using the weekly kit. Provided with 8 weekly kits 8 extra anchors for home use.   Device:  N/A  BP & Labs:  Doppler BP 90 - Doppler is reflecting MAP   Hgb 14.3 -  No S/S of bleeding. Specifically denies melena/BRBPR or nosebleeds.   LDH 160 - with established baseline of 250 - 430. Denies tea-colored urine. No power elevations noted on interrogation.     3.5 year Intermacs follow up completed including:  Quality of Life, KCCQ-12, and Neurocognitive trail making.   Pt did not complete 6 minute walk due to nausea and vomiting during visit.   Back up controller: 11V backup battery charged during this visit  [facility] Cardiomyopathy Questionnaire     09/25/2022    3:32 PM 02/22/2022    1:33 PM 08/16/2021    3:25 PM  KCCQ-12  1 a. Ability to shower/bathe Not at all limited Not at all limited Not at all limited  1 b. Ability to walk 1 block Not at all limited Not at all limited Not at all limited  1 c. Ability to hurry/jog Not at all limited Not at all limited Not at all limited  2. Edema feet/ankles/legs Never over the past 2 weeks Never over the past 2 weeks Never  over the past 2 weeks  3. Limited by fatigue Never over the past 2 weeks Never over the past 2 weeks Never over the past 2 weeks  4. Limited by dyspnea Never over the past 2 weeks Never over the past 2 weeks Never over the past 2 weeks  5. Sitting up / on 3+ pillows Never over the past 2 weeks Never over the past 2 weeks Never over the past 2 weeks  6. Limited enjoyment of life Not limited at all Not limited at all Not limited at all  7. Rest of life w/ symptoms Not at all satisfied Not at all satisfied Not at all satisfied  8 a. Participation in hobbies Did not limit at all N/A, did not do for other reasons N/A, did not do for other reasons  8 b. Participation in chores Did not limit at all N/A, did not do for other reasons N/A, did not do for other reasons  8 c. Visiting family/friends Did not limit at all N/A, did not do for other reasons N/A, did not do for other reasons     Patient Goals: to gain weight and maintain 110 lbs or greater.    Patient Instructions: No change in medications. Called your pharmacy and they will refill your antibiotic - please re-start today. Will ask Social Worker to send you case of Ensure, she will call you to discuss. Please answer your phone. Return to VAD Clinic in 2 months.    [name] RN VAD Coordinator  Office: [phone]  24/7 Pager: [phone]

## 2022-09-27 ENCOUNTER — Telehealth (HOSPITAL_COMMUNITY): Payer: Self-pay | Admitting: Licensed Clinical Social Worker

## 2022-09-27 NOTE — Telephone Encounter (Signed)
H&V Care Navigation CSW Progress Note  Clinical Social Worker consulted to help get pt supply of Ensure to help with his weight loss.  Able to send shipment of 24pack to pt after confirming his address with him.  Explained this would not be ongoing and encouraged him to call Medicaid to inquire if he can get Ensure covered through them.   SDOH Screenings   Food Insecurity: Food Insecurity Present (07/04/2022)  Housing: Low Risk  (07/04/2022)  Transportation Needs: No Transportation Needs (07/04/2022)  Utilities: Not At Risk (07/04/2022)  Alcohol Screen: Medium Risk (03/05/2019)  Depression (PHQ2-9): Low Risk  (02/22/2019)  Financial Resource Strain: High Risk (07/04/2022)  Physical Activity: Sufficiently Active (03/05/2019)  Tobacco Use: Medium Risk (09/25/2022)    Burna Sis, LCSW Clinical Social Worker [facility] Desk#: [phone] Cell#: [phone]

## 2022-11-25 ENCOUNTER — Encounter (HOSPITAL_COMMUNITY): Payer: Medicaid Other | Admitting: Internal Medicine

## 2022-11-26 ENCOUNTER — Other Ambulatory Visit (HOSPITAL_COMMUNITY): Payer: Self-pay

## 2022-11-26 DIAGNOSIS — Z95811 Presence of heart assist device: Secondary | ICD-10-CM

## 2022-11-26 DIAGNOSIS — Z7901 Long term (current) use of anticoagulants: Secondary | ICD-10-CM

## 2022-11-27 ENCOUNTER — Ambulatory Visit (HOSPITAL_COMMUNITY)
Admission: RE | Admit: 2022-11-27 | Discharge: 2022-11-27 | Disposition: A | Discharge status: Home / Self Care | Payer: Medicaid Other | Source: Ambulatory Visit | Attending: Internal Medicine | Admitting: Internal Medicine

## 2022-11-27 VITALS — BP 90/0 | Wt 112.8 lb

## 2022-11-27 DIAGNOSIS — Z4801 Encounter for change or removal of surgical wound dressing: Secondary | ICD-10-CM | POA: Insufficient documentation

## 2022-11-27 DIAGNOSIS — I10 Essential (primary) hypertension: Secondary | ICD-10-CM

## 2022-11-27 DIAGNOSIS — I5022 Chronic systolic (congestive) heart failure: Secondary | ICD-10-CM

## 2022-11-27 DIAGNOSIS — R748 Abnormal levels of other serum enzymes: Secondary | ICD-10-CM

## 2022-11-27 DIAGNOSIS — Z95811 Presence of heart assist device: Secondary | ICD-10-CM | POA: Diagnosis not present

## 2022-11-27 DIAGNOSIS — R634 Abnormal weight loss: Secondary | ICD-10-CM | POA: Diagnosis not present

## 2022-11-27 DIAGNOSIS — Z7901 Long term (current) use of anticoagulants: Secondary | ICD-10-CM

## 2022-11-27 DIAGNOSIS — T829XXD Unspecified complication of cardiac and vascular prosthetic device, implant and graft, subsequent encounter: Secondary | ICD-10-CM

## 2022-11-27 LAB — LACTATE DEHYDROGENASE: LDH: 177 U/L (ref 98–192)

## 2022-11-27 LAB — BASIC METABOLIC PANEL
Anion gap: 8 (ref 5–15)
BUN: 5 mg/dL — ABNORMAL LOW (ref 6–20)
CO2: 25 mmol/L (ref 22–32)
Calcium: 8.9 mg/dL (ref 8.9–10.3)
Chloride: 107 mmol/L (ref 98–111)
Creatinine, Ser: 0.74 mg/dL (ref 0.61–1.24)
GFR, Estimated: >60 mL/min (ref >60)
Glucose, Bld: 96 mg/dL (ref 70–99)
Potassium: 3.7 mmol/L (ref 3.5–5.1)
Sodium: 140 mmol/L (ref 135–145)

## 2022-11-27 NOTE — Progress Notes (Signed)
Patient presents for 2 mo f/u in VAD clinic today alone.  Denies issues with VAD equipment or drive line.   Pt states he has been feeling great. Denies lightheadedness, dizziness, falls, shortness of breath, heart failure symptoms, and signs of bleeding. States he is making an effort to eat more each day. States he has plenty of food, and his family brings food over daily. His weight is up 10 lbs today. Encouraged him to keep up the good work with his PO intake. Will plan to mail more Ensure to his home again per Dr Gala Romney. Rosetta Posner CSW will reach out to pt to set this up.   Pt confirms that he is taking his Cefadroxil, Zinc, and Potassium as prescribed. He brought all meds to clinic today for VAD coordinator review.   Dressing changed today in clinic. See documentation below for details.   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 LVAD interrogation was NEGATIVE for significant power changes, NEGATIVE for clinical alarms, and POSITIVE for PI events/speed drops. No programming changes were made and pump is functioning within specified parameters. Pt is performing daily controller and system monitor self tests along with completing weekly and monthly maintenance for LVAD equipment.   LVAD equipment check  completed and is in good working order. Back-up equipment present.   Annual Equipment Maintenance on UBC/PM was performed on 02/22/22.  Exit Site Care: Existing weekly VAD dressing C/D/I with anchor intact and accurately applied. Patient is changing weekly.  Existing VAD dressing removed and site care performed using sterile technique. Drive line exit site cleaned with Chlora prep applicators x 2, allowed to dry, and rinsed with saline. Sorbaview dressing with Silverlon patch applied. Exit site healed and incorporated, the velour is fully implanted at exit site. No redness, tenderness, drainage, foul odor or rash noted. Small area of hypergranulation tissue noted at exit site- treated with silver nitrate stick x 1. Drive line anchor re-applied. Pt denies fever or chills. Continue weekly dressing changes using the weekly kit. Provided with 12 weekly kits 8 extra anchors for home use.   Device: N/A  BP & Labs:  Doppler BP 90 - Doppler is reflecting modified  systolic   Hgb pending -  No S/S of bleeding. Specifically denies melena/BRBPR or nosebleeds.   LDH pending - with established baseline of 250 - 430. Denies tea-colored urine. No power elevations noted on interrogation.     Patient Instructions: No medication changes today Return to VAD clinic in 2 months for follow up appointment with Dr Gala Romney.  Alyce Pagan RN VAD Coordinator  Office: [phone]  24/7 Pager: [phone]

## 2022-11-27 NOTE — Patient Instructions (Signed)
No medication changes today Return to VAD clinic in 2 months for follow up appointment with Dr Gala Romney.

## 2022-11-29 ENCOUNTER — Telehealth (HOSPITAL_COMMUNITY): Payer: Self-pay | Admitting: Licensed Clinical Social Worker

## 2022-11-29 ENCOUNTER — Other Ambulatory Visit (HOSPITAL_COMMUNITY): Payer: Self-pay | Admitting: *Deleted

## 2022-11-29 DIAGNOSIS — I5022 Chronic systolic (congestive) heart failure: Secondary | ICD-10-CM

## 2022-11-29 DIAGNOSIS — Z95811 Presence of heart assist device: Secondary | ICD-10-CM

## 2022-11-29 DIAGNOSIS — R634 Abnormal weight loss: Secondary | ICD-10-CM

## 2022-11-29 MED ORDER — ENSURE HIGH PROTEIN PO LIQD: ORAL | 12 refills | Status: DC

## 2022-11-29 NOTE — Telephone Encounter (Signed)
H&V Care Navigation CSW Progress Note  Clinical Social Worker assisted in ordered ensure to be delivered to pt home to assist with pt nutrition- expected delivery 10/28   SDOH Screenings   Food Insecurity: Food Insecurity Present (07/04/2022)  Housing: Low Risk  (07/04/2022)  Transportation Needs: No Transportation Needs (07/04/2022)  Utilities: Not At Risk (07/04/2022)  Alcohol Screen: Medium Risk (03/05/2019)  Depression (PHQ2-9): Low Risk  (02/22/2019)  Financial Resource Strain: High Risk (07/04/2022)  Physical Activity: Sufficiently Active (03/05/2019)  Tobacco Use: Medium Risk (09/25/2022)    Burna Sis, LCSW Clinical Social Worker [facility] Desk#: [phone] Cell#: [phone]

## 2022-11-29 NOTE — Progress Notes (Signed)
VAD CLINIC NOTE  HPI:  [name] is a 37 y/o male from [place]  w/ h/o severe systolic HF due to NICM EF 15% and prior polysubstance abuse (cocaine and ETOH)   Underwent HM-3 LVAD placement on 02/26/19 by Dr. Morton Peters. Required milrinone post op for RV support.  Admitted 6/21 for AKI and hypotension in setting of volume depletion due to GI bug.Had multiple flow alarms. Echo EF 40-45%   Admitted 12/21 for anemia in setting of supratherapeutic INR (8.0) and bleeding from a sore on his lip. Transfused but did not bleed any further. Developed refractory low flow alarms on VAD with drop in platelets and rising LDH. RUQ U/S was normal (doubt splenic sequestration). Peripheral smear showed no evidence of hemolysis.Patient placed on his left side and low flow alarms resolved. It was suspected possible obstruction/clot of outflow graft causing Plts and Hgb to drop. CT 12/10 showed no evidence of outflow graft kink/clot.  LDH trended back down to his baseline, below 200s. Plts started to trend back up.  Admitted 3/22 for MSSA DL infection requiring surgical debridement. D/c'd to [facility] for IV abx.    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 Wound cx grew MSSA. Treated with cefazolin. Discharged home on 03/16/21 with wound vac. ID recommended oral linezolid BID for 3 weeks at discharge then switch to chronic cefadroxil afterwards.  Here for routine f/u. Doing well. Says he is eating more. Has gained about 10 pounds. Denies orthopnea or PND. No fevers, chills or problems with driveline. No bleeding, melena or neuro symptoms. No VAD alarms. Taking all meds as prescribed.      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 LVAD interrogation was NEGATIVE for significant power changes, NEGATIVE for clinical alarms, and POSITIVE for PI events/speed drops. No programming changes were made and pump is functioning within specified parameters. Pt is performing daily controller and system monitor self tests along with completing weekly and monthly maintenance for LVAD equipment.   LVAD equipment check completed and is in good working order. Back-up equipment present.    Annual Equipment Maintenance on  UBC/PM was performed on 02/22/22.   Exit Site Care: Existing weekly VAD dressing C/D/I with anchor intact and accurately applied. Patient is changing weekly.  Existing VAD dressing removed and site care performed using sterile technique. Drive line exit site cleaned with Chlora prep applicators x 2, allowed to dry, and rinsed with saline. Sorbaview dressing with Silverlon patch applied. Exit site healed and incorporated, the velour is fully implanted at exit site. No redness, tenderness, drainage, foul odor or rash noted. Small area of hypergranulation tissue noted at exit site- treated with silver nitrate stick x 1. Drive line anchor re-applied. Pt denies fever or chills. Continue weekly dressing changes using the weekly kit. Provided with 12 weekly kits 8 extra anchors for home use.      Past Medical History:  Diagnosis Date   Dilated cardiomyopathy (HCC)    Dilated cardiomyopathy (HCC) 02/2019   Polysubstance abuse (HCC)     Current Outpatient Medications  Medication Sig Dispense Refill   cefadroxil (DURICEF) 500 MG capsule Take 1 capsule (500 mg total) by mouth 2 (two) times daily. 60 capsule 11    KLOR-CON M20 20 MEQ tablet TAKE 2 TABLETS (40 MEQ) BY MOUTH DAILY, OR AS DIRECTED BY HEART FAILURE CLINIC 180 tablet 3   Zinc Sulfate 220 (50 Zn) MG TABS Take 1 tablet (220 mg total) by mouth daily. 90 tablet 3   No current facility-administered medications for this encounter.    Patient has no known allergies.   Vital Signs:  Doppler Pressure: 90 Automatc BP: 88/62 (72) HR: 85 SPO2: 99%   Weight: 112.8 lb w/o eqt Last clinic weight: 102.2 lb w/o eqt    Physical Exam: General:  NAD. Thin HEENT: normal  Neck: supple. JVP not elevated.  Carotids 2+ bilat; no bruits. No lymphadenopathy or thryomegaly appreciated. Cor: LVAD hum.  Lungs: Clear. Abdomen: obese soft, nontender, non-distended. No hepatosplenomegaly. No bruits or masses. Good bowel sounds. Driveline site clean. Anchor in place.  Extremities: no cyanosis, clubbing, rash. Warm no edema  Neuro: alert & oriented x 3. No focal deficits. Moves all 4 without problem    ASSESSMENT AND PLAN:   1. Chronic systolic HF due to severe NICM s/p VAD - Echo EF 10% with biventricular failure - HM-3 VAD implant 02/26/19 - Echo 7/21 EF 40-45% - Stable NYHA I.  - Volume ok - MAPS ok  2. Recurrent MSSA DL infection - Initial episode 3/22 - Recurrent infection 2/23 requiring debridement and wound vac.  - Site now healed   - Continue suppressive cefadroxil  3. VAD - Admit 12/21 with multiple low flow alarms. Rising LDH and decreasing PLTs. Suspected outflow graft kink occlusion. Patient placed on his left side and resolved.  - VAD interrogated personally. Parameters stable. - DL site ok as above (he is doing his own dressing changes) - Hgb  improved 11.8 -> 11.7-> 14.3 - LDH 177 - Remains off warfarin due to elevated labile INRs. No change.  4. HTN - MAPs ok  5. Elevated LFTs/thrombocytopenia - suspect related to cefuroxil - RUQ u/s 8/23 ok. Hepatitis panels negative - normalized  6. Weight loss -> protein-calorie  malnutrition - has struggled with poor diet since his mother passed - I am very happt to see that he has gained 10 pounds - I d/w SW. I asked them to send him more Ensure. Asked him to continue to drink 2 per day.    I spent a total of 45 minutes today: 1) reviewing the patient's  medical records including previous charts, labs and recent notes from other providers; 2) examining the patient and counseling them on their medical issues/explaining the plan of care; 3) adjusting meds as needed and 4) ordering lab work or other needed tests.   Arvilla Meres, MD  9:12 AM

## 2023-01-24 ENCOUNTER — Telehealth (HOSPITAL_COMMUNITY): Payer: Self-pay | Admitting: *Deleted

## 2023-01-24 ENCOUNTER — Other Ambulatory Visit (HOSPITAL_COMMUNITY): Payer: Self-pay | Admitting: *Deleted

## 2023-01-24 DIAGNOSIS — Z95811 Presence of heart assist device: Secondary | ICD-10-CM

## 2023-01-24 DIAGNOSIS — I5022 Chronic systolic (congestive) heart failure: Secondary | ICD-10-CM

## 2023-01-24 DIAGNOSIS — R634 Abnormal weight loss: Secondary | ICD-10-CM

## 2023-01-24 NOTE — Telephone Encounter (Signed)
Attempted to reach pt regarding need to bring MPU, battery charger, and black bag to clinic appt on Monday for annual maintenance. Pt did not answer, and voicemail is full.   Alyce Pagan RN VAD Coordinator  Office: [phone]  24/7 Pager: [phone]

## 2023-01-27 ENCOUNTER — Ambulatory Visit (HOSPITAL_COMMUNITY)
Admission: RE | Admit: 2023-01-27 | Discharge: 2023-01-27 | Disposition: A | Discharge status: Home / Self Care | Payer: Medicaid Other | Source: Ambulatory Visit | Attending: Internal Medicine | Admitting: Internal Medicine

## 2023-01-27 VITALS — BP 83/70 | HR 95 | Wt 113.0 lb

## 2023-01-27 DIAGNOSIS — Z8619 Personal history of other infectious and parasitic diseases: Secondary | ICD-10-CM | POA: Insufficient documentation

## 2023-01-27 DIAGNOSIS — Z95811 Presence of heart assist device: Secondary | ICD-10-CM

## 2023-01-27 DIAGNOSIS — I110 Hypertensive heart disease with heart failure: Secondary | ICD-10-CM | POA: Diagnosis not present

## 2023-01-27 DIAGNOSIS — I10 Essential (primary) hypertension: Secondary | ICD-10-CM

## 2023-01-27 DIAGNOSIS — Z4502 Encounter for adjustment and management of automatic implantable cardiac defibrillator: Secondary | ICD-10-CM | POA: Insufficient documentation

## 2023-01-27 DIAGNOSIS — T827XXA Infection and inflammatory reaction due to other cardiac and vascular devices, implants and grafts, initial encounter: Secondary | ICD-10-CM | POA: Diagnosis not present

## 2023-01-27 DIAGNOSIS — R634 Abnormal weight loss: Secondary | ICD-10-CM

## 2023-01-27 DIAGNOSIS — E46 Unspecified protein-calorie malnutrition: Secondary | ICD-10-CM | POA: Diagnosis not present

## 2023-01-27 DIAGNOSIS — Z792 Long term (current) use of antibiotics: Secondary | ICD-10-CM | POA: Insufficient documentation

## 2023-01-27 DIAGNOSIS — E440 Moderate protein-calorie malnutrition: Secondary | ICD-10-CM

## 2023-01-27 DIAGNOSIS — B9561 Methicillin susceptible Staphylococcus aureus infection as the cause of diseases classified elsewhere: Secondary | ICD-10-CM | POA: Diagnosis not present

## 2023-01-27 DIAGNOSIS — D696 Thrombocytopenia, unspecified: Secondary | ICD-10-CM | POA: Insufficient documentation

## 2023-01-27 DIAGNOSIS — I420 Dilated cardiomyopathy: Secondary | ICD-10-CM | POA: Insufficient documentation

## 2023-01-27 DIAGNOSIS — I5022 Chronic systolic (congestive) heart failure: Secondary | ICD-10-CM

## 2023-01-27 LAB — COMPREHENSIVE METABOLIC PANEL WITH GFR
ALT: 14 U/L (ref 0–44)
AST: 29 U/L (ref 15–41)
Albumin: 3.3 g/dL — ABNORMAL LOW (ref 3.5–5.0)
Alkaline Phosphatase: 70 U/L (ref 38–126)
Anion gap: 10 (ref 5–15)
BUN: <5 mg/dL — ABNORMAL LOW (ref 6–20)
CO2: 22 mmol/L (ref 22–32)
Calcium: 9.2 mg/dL (ref 8.9–10.3)
Chloride: 108 mmol/L (ref 98–111)
Creatinine, Ser: 0.89 mg/dL (ref 0.61–1.24)
GFR, Estimated: >60 mL/min
Glucose, Bld: 86 mg/dL (ref 70–99)
Potassium: 3.8 mmol/L (ref 3.5–5.1)
Sodium: 140 mmol/L (ref 135–145)
Total Bilirubin: 1.1 mg/dL
Total Protein: 6.6 g/dL (ref 6.5–8.1)

## 2023-01-27 LAB — CBC
HCT: 31 % — ABNORMAL LOW (ref 39.0–52.0)
Hemoglobin: 10.4 g/dL — ABNORMAL LOW (ref 13.0–17.0)
MCH: 34.1 pg — ABNORMAL HIGH (ref 26.0–34.0)
MCHC: 33.5 g/dL (ref 30.0–36.0)
MCV: 101.6 fL — ABNORMAL HIGH (ref 80.0–100.0)
Platelets: 164 10*3/uL (ref 150–400)
RBC: 3.05 MIL/uL — ABNORMAL LOW (ref 4.22–5.81)
RDW: 16.6 % — ABNORMAL HIGH (ref 11.5–15.5)
WBC: 5.1 10*3/uL (ref 4.0–10.5)
nRBC: 0 % (ref 0.0–0.2)

## 2023-01-27 LAB — LACTATE DEHYDROGENASE: LDH: 183 U/L (ref 98–192)

## 2023-01-27 LAB — PREALBUMIN: Prealbumin: 28 mg/dL (ref 18–38)

## 2023-01-27 NOTE — Patient Instructions (Signed)
No medication changes Return to clinic in 2 months. Please bring your black bag, battery charger, and MPU for annual maintenance

## 2023-01-27 NOTE — Progress Notes (Addendum)
Patient presents for 2 mo f/u with 4 year Intermacs in VAD clinic today alone. Denies issues with VAD equipment or drive line. Pt forgot to bring equipment for annual maintenance.  Pt states he has been feeling great. Denies lightheadedness, dizziness, falls, shortness of breath, heart failure symptoms, and signs of bleeding. States he has plenty of food, and his family brings food over daily. He is drinking an Ensure twice a day. His weight is stable today. Encouraged him to keep up the good work with his PO intake.   Pt confirms that he is taking his Cefadroxil, Zinc, and Potassium as prescribed. He brought all meds to clinic today for VAD coordinator review.   Dressing changed today in clinic. See documentation below for details.   Pt beeping on arrival to clinic room. States he has been experiencing intermittent beeping (power disconnect and low battery) for the last 2-3 weeks. Modular cable and both power cords very twisted and bent. Controller last exchanged in May, 2024 and last mod cable exchange was January 2024. Both controller and modular cable exchanged today with resolution of beeping. Modular cable: [id number] Exp date: 08/11/25. Controller: [id number] Exp date: 08/25/24.    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 LVAD interrogation was NEGATIVE for significant power changes, NEGATIVE for clinical alarms, and POSITIVE for PI events/speed drops. No programming changes were made and pump is functioning within specified parameters. Pt is performing daily controller and system monitor self tests along with completing weekly and monthly maintenance for LVAD equipment.   LVAD equipment check completed and is in good working order. Back-up equipment present.   Annual Equipment Maintenance on UBC/PM was performed on 02/22/22.  Exit Site Care: Existing weekly VAD dressing C/D/I with anchor intact and accurately applied. Patient is changing weekly.  Existing VAD dressing removed and site care performed using sterile technique. Drive line exit site cleaned with Chlora prep applicators x 2, allowed to dry, and rinsed with saline. Sorbaview dressing with Silverlon patch applied. Exit site healed and incorporated, the velour is fully implanted at exit site. No redness, tenderness, drainage, foul odor or rash noted. Small area of hypergranulation tissue noted at exit site- treated  with silver nitrate stick x 2. Drive line anchor re-applied. Pt denies fever or chills. Continue weekly dressing changes using the weekly kit. Provided with 12 weekly kits 8 extra anchors for home use.     Device: N/A  BP & Labs:  Doppler BP 80 - Doppler is reflecting modified systolic   Hgb 10.4 -  No S/S of bleeding. Specifically denies melena/BRBPR or nosebleeds.   LDH pending - with established baseline of 250 - 430. Denies tea-colored urine. No power elevations noted on interrogation.     4 year Intermacs follow up completed including:  Quality of Life, KCCQ-12, and Neurocognitive trail making.   Pt completed 1450 feet during 6 minute walk.  Back up controller: 11V backup battery charged during this visit.  Patient Goals: To continue to feel good  [facility] Cardiomyopathy Questionnaire     01/27/2023    1:08 PM  09/25/2022    3:32 PM 02/22/2022    1:33 PM  KCCQ-12  1 a. Ability to shower/bathe Not at all limited Not at all limited Not at all limited  1 b. Ability to walk 1 block Not at all limited Not at all limited Not at all limited  1 c. Ability to hurry/jog Not at all limited Not at all limited Not at all limited  2. Edema feet/ankles/legs Never over the past 2 weeks Never over the past 2 weeks Never over the past 2 weeks  3. Limited by fatigue Never over the past 2 weeks Never over the past 2 weeks Never over the past 2 weeks  4. Limited by dyspnea Never over the past 2 weeks Never over the past 2 weeks Never over the past 2 weeks  5. Sitting up / on 3+ pillows Never over the past 2 weeks Never over the past 2 weeks Never over the past 2 weeks  6. Limited enjoyment of life Not limited at all Not limited at all Not limited at all  7. Rest of life w/ symptoms Not at all satisfied Not at all satisfied Not at all satisfied  8 a. Participation in hobbies Did not limit at all Did not limit at all N/A, did not do for other reasons  8 b. Participation in chores Did not limit at all Did not limit at all N/A, did not do for other reasons  8 c. Visiting family/friends Did not limit at all Did not limit at all N/A, did not do for other reasons      Patient Instructions: No medication changes Return to clinic in 2 months. Please bring your black bag, Magazine features editor, and MPU for annual maintenance  Alyce Pagan RN VAD Coordinator  Office: [phone]  24/7 Pager: [phone]

## 2023-01-29 NOTE — Progress Notes (Signed)
VAD CLINIC NOTE  HPI:  [name] is a 37 y/o male from [place]  w/ h/o severe systolic HF due to NICM EF 15% and prior polysubstance abuse (cocaine and ETOH)   Underwent HM-3 LVAD placement on 02/26/19 by Dr. Morton Peters. Required milrinone post op for RV support.  Admitted 6/21 for AKI and hypotension in setting of volume depletion due to GI bug.Had multiple flow alarms. Echo EF 40-45%   Admitted 12/21 for anemia in setting of supratherapeutic INR (8.0) and bleeding from a sore on his lip. Transfused but did not bleed any further. Developed refractory low flow alarms on VAD with drop in platelets and rising LDH. RUQ U/S was normal (doubt splenic sequestration). Peripheral smear showed no evidence of hemolysis.Patient placed on his left side and low flow alarms resolved. It was suspected possible obstruction/clot of outflow graft causing Plts and Hgb to drop. CT 12/10 showed no evidence of outflow graft kink/clot.  LDH trended back down to his baseline, below 200s. Plts started to trend back up.  Admitted 3/22 for MSSA DL infection requiring surgical debridement. D/c'd to [facility] for IV abx.    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 Wound cx grew MSSA. Treated with cefazolin. Discharged home on 03/16/21 with wound vac. ID recommended oral linezolid BID for 3 weeks at discharge then switch to chronic cefadroxil afterwards.  Here for routine f/u. Says he is doing well. Trying to eat more. Does all ADLs without problem. Denies orthopnea or PND. No fevers or chills. No bleeding, melena or neuro symptoms. No VAD alarms. Taking all meds as prescribed.  Says controller has been beeping.      VAD Indication: Destination Therapy LVAD due to narcotic use   LVAD assessment: HM III: Speed:  5400 rpms                                 Flow: 3.6                                    Power: 3.9 w                                              PI: 6.4   Hct: 20 -- do not  titrate                                   Alarms: none Events: 20-30  Fixed speed: 5400 Low speed limit: 5100   Primary Controller: Replace back up battery in 22 months Back up controller:   Replace back up battery in 12 months   I reviewed the LVAD parameters from today and compared the results to the patient's prior recorded data. LVAD interrogation was NEGATIVE for significant power changes, NEGATIVE for clinical alarms, and POSITIVE for PI events/speed drops. No programming changes were made and pump is functioning within specified parameters. Pt is performing daily controller and system monitor self tests along with completing weekly and monthly maintenance for LVAD equipment.   LVAD equipment check completed and is in good working order. Back-up equipment present.    Annual  Equipment Maintenance on UBC/PM was performed on 02/22/22.   Past Medical History:  Diagnosis Date   Dilated cardiomyopathy (HCC)    Dilated cardiomyopathy (HCC) 02/2019   Polysubstance abuse (HCC)     Current Outpatient Medications  Medication Sig Dispense Refill   cefadroxil (DURICEF) 500 MG capsule Take 1 capsule (500 mg total) by mouth 2 (two) times daily. 60 capsule 11   KLOR-CON M20 20 MEQ tablet TAKE 2 TABLETS (40 MEQ) BY MOUTH DAILY, OR AS DIRECTED BY HEART FAILURE CLINIC 180 tablet 3   Nutritional Supplements (ENSURE HIGH PROTEIN) LIQD Drink a high protein Ensure twice daily until instructed otherwise by Dr Gala Romney 237 mL 12   Zinc Sulfate 220 (50 Zn) MG TABS Take 1 tablet (220 mg total) by mouth daily. 90 tablet 3   No current facility-administered medications for this encounter.    Patient has no known allergies.   Vital Signs:  Doppler Pressure: 80 Automatc BP: 83/70 (76) HR: 95 SPO2: 100%   Weight: 113.0 lb w/o eqt Last clinic weight: 112.8 lb w/o eqt  Physical Exam: General:  NAD. Thin HEENT: normal  Neck: supple. JVP not elevated.  Carotids 2+ bilat; no bruits. No  lymphadenopathy or thryomegaly appreciated. Cor: LVAD hum.  Lungs: Clear. Abdomen: obese soft, nontender, non-distended. No hepatosplenomegaly. No bruits or masses. Good bowel sounds. Driveline site clean. Anchor in place.  Extremities: no cyanosis, clubbing, rash. Warm no edema  Neuro: alert & oriented x 3. No focal deficits. Moves all 4 without problem    ASSESSMENT AND PLAN:   1. Chronic systolic HF due to severe NICM s/p VAD - Echo EF 10% with biventricular failure - HM-3 VAD implant 02/26/19 - Echo 7/21 EF 40-45% - Stable NYHA I  - Volume ok - MAPs ok  2. Recurrent MSSA DL infection - Initial episode 3/22 - Recurrent infection 2/23 requiring debridement and wound vac.  - Site looks ok today - Continue suppressive cefadroxil.   3. VAD - Admit 12/21 with multiple low flow alarms. Rising LDH and decreasing PLTs. Suspected outflow graft kink occlusion. Patient placed on his left side and resolved.  - VAD interrogated personally. Parameters stable. - DL site ok as above - Modular and battery cables markedly twisted today with beeping. We replaced these in clinic with resolution of beeping. Long discussion about risk of breaking wires and shutting off VAD.  - Hgb trending back down. 11.8 -> 11.7-> 14.3 -> 10.4. No obvious bleeding. Continue to follow - LDH 188 - Remains off warfarin due to elevated labile INRs. No change  4. HTN - MAPs ok   5. Elevated LFTs/thrombocytopenia - suspect related to cefuroxil - RUQ u/s 8/23 ok. Hepatitis panels negative - normalized  6. Weight loss -> protein-calorie malnutrition - has struggled with poor diet since his mother passed - Weight stable since last visit - Encouraged Ensure supplements   I spent a total of 45 minutes today: 1) reviewing the patient's medical records including previous charts, labs and recent notes from other providers; 2) examining the patient and counseling them on their medical issues/explaining the plan of care;  3) adjusting meds as needed and 4) ordering lab work or other needed tests.   Arvilla Meres, MD  7:33 PM

## 2023-04-09 ENCOUNTER — Other Ambulatory Visit (HOSPITAL_COMMUNITY): Payer: Self-pay

## 2023-04-09 DIAGNOSIS — Z95811 Presence of heart assist device: Secondary | ICD-10-CM

## 2023-04-09 DIAGNOSIS — Z7901 Long term (current) use of anticoagulants: Secondary | ICD-10-CM

## 2023-04-11 ENCOUNTER — Encounter (HOSPITAL_COMMUNITY): Payer: Medicaid Other | Admitting: Internal Medicine

## 2023-04-30 ENCOUNTER — Telehealth (HOSPITAL_COMMUNITY): Payer: Self-pay

## 2023-04-30 NOTE — Telephone Encounter (Signed)
 Pt called VAD Clinic reporting that he is unable to schedule transportation with his insurance company. VAD Coordinator called the number the pt was using to set up transportation and was told pt should be using transportation services through [facility]. Phone number to request transportation is [phone]. Pt asked to set up transportation for 05/08/23 at 2pm and requested that if he has difficulty to please call the VAD Clinic back for assistance.  Simmie Davies RN, BSN VAD Coordinator 24/7 Pager [phone]

## 2023-05-01 ENCOUNTER — Encounter (HOSPITAL_COMMUNITY): Admitting: Internal Medicine

## 2023-05-08 ENCOUNTER — Ambulatory Visit (HOSPITAL_COMMUNITY)
Admission: RE | Admit: 2023-05-08 | Discharge: 2023-05-08 | Disposition: A | Discharge status: Home / Self Care | Source: Ambulatory Visit | Attending: Internal Medicine | Admitting: Internal Medicine

## 2023-05-08 DIAGNOSIS — Z7901 Long term (current) use of anticoagulants: Secondary | ICD-10-CM | POA: Insufficient documentation

## 2023-05-08 DIAGNOSIS — T827XXA Infection and inflammatory reaction due to other cardiac and vascular devices, implants and grafts, initial encounter: Secondary | ICD-10-CM | POA: Diagnosis not present

## 2023-05-08 DIAGNOSIS — R634 Abnormal weight loss: Secondary | ICD-10-CM | POA: Insufficient documentation

## 2023-05-08 DIAGNOSIS — I5022 Chronic systolic (congestive) heart failure: Secondary | ICD-10-CM | POA: Insufficient documentation

## 2023-05-08 DIAGNOSIS — F1411 Cocaine abuse, in remission: Secondary | ICD-10-CM | POA: Insufficient documentation

## 2023-05-08 DIAGNOSIS — B9561 Methicillin susceptible Staphylococcus aureus infection as the cause of diseases classified elsewhere: Secondary | ICD-10-CM | POA: Insufficient documentation

## 2023-05-08 DIAGNOSIS — Z4509 Encounter for adjustment and management of other cardiac device: Secondary | ICD-10-CM | POA: Diagnosis present

## 2023-05-08 DIAGNOSIS — I420 Dilated cardiomyopathy: Secondary | ICD-10-CM | POA: Insufficient documentation

## 2023-05-08 DIAGNOSIS — E46 Unspecified protein-calorie malnutrition: Secondary | ICD-10-CM | POA: Insufficient documentation

## 2023-05-08 DIAGNOSIS — F1011 Alcohol abuse, in remission: Secondary | ICD-10-CM | POA: Diagnosis not present

## 2023-05-08 DIAGNOSIS — E876 Hypokalemia: Secondary | ICD-10-CM | POA: Diagnosis not present

## 2023-05-08 DIAGNOSIS — I110 Hypertensive heart disease with heart failure: Secondary | ICD-10-CM | POA: Insufficient documentation

## 2023-05-08 DIAGNOSIS — Z95811 Presence of heart assist device: Secondary | ICD-10-CM | POA: Insufficient documentation

## 2023-05-08 DIAGNOSIS — I10 Essential (primary) hypertension: Secondary | ICD-10-CM

## 2023-05-08 DIAGNOSIS — D696 Thrombocytopenia, unspecified: Secondary | ICD-10-CM | POA: Diagnosis not present

## 2023-05-08 DIAGNOSIS — R7989 Other specified abnormal findings of blood chemistry: Secondary | ICD-10-CM | POA: Insufficient documentation

## 2023-05-08 DIAGNOSIS — I428 Other cardiomyopathies: Secondary | ICD-10-CM | POA: Diagnosis present

## 2023-05-08 DIAGNOSIS — Z792 Long term (current) use of antibiotics: Secondary | ICD-10-CM | POA: Diagnosis not present

## 2023-05-08 DIAGNOSIS — T827XXD Infection and inflammatory reaction due to other cardiac and vascular devices, implants and grafts, subsequent encounter: Secondary | ICD-10-CM | POA: Insufficient documentation

## 2023-05-08 DIAGNOSIS — E440 Moderate protein-calorie malnutrition: Secondary | ICD-10-CM

## 2023-05-08 LAB — PROTIME-INR
INR: 1.1 (ref 0.8–1.2)
Prothrombin Time: 13.9 s (ref 11.4–15.2)

## 2023-05-08 LAB — BASIC METABOLIC PANEL WITH GFR
Anion gap: 13 (ref 5–15)
BUN: 6 mg/dL (ref 6–20)
CO2: 24 mmol/L (ref 22–32)
Calcium: 9.1 mg/dL (ref 8.9–10.3)
Chloride: 104 mmol/L (ref 98–111)
Creatinine, Ser: 1.01 mg/dL (ref 0.61–1.24)
GFR, Estimated: >60 mL/min (ref >60)
Glucose, Bld: 100 mg/dL — ABNORMAL HIGH (ref 70–99)
Potassium: 3.3 mmol/L — ABNORMAL LOW (ref 3.5–5.1)
Sodium: 141 mmol/L (ref 135–145)

## 2023-05-08 LAB — LACTATE DEHYDROGENASE: LDH: 179 U/L (ref 98–192)

## 2023-05-08 LAB — CBC
HCT: 31.7 % — ABNORMAL LOW (ref 39.0–52.0)
Hemoglobin: 10.7 g/dL — ABNORMAL LOW (ref 13.0–17.0)
MCH: 34.3 pg — ABNORMAL HIGH (ref 26.0–34.0)
MCHC: 33.8 g/dL (ref 30.0–36.0)
MCV: 101.6 fL — ABNORMAL HIGH (ref 80.0–100.0)
Platelets: 104 10*3/uL — ABNORMAL LOW (ref 150–400)
RBC: 3.12 MIL/uL — ABNORMAL LOW (ref 4.22–5.81)
RDW: 14.6 % (ref 11.5–15.5)
WBC: 4.7 10*3/uL (ref 4.0–10.5)
nRBC: 0 % (ref 0.0–0.2)

## 2023-05-08 NOTE — Progress Notes (Addendum)
 Patient presents for 2 mo f/u with annual main in VAD clinic today alone. Denies issues with VAD equipment or drive line.   Pt states he has been feeling great. Denies lightheadedness, dizziness, falls, shortness of breath, heart failure symptoms, and signs of bleeding. States he has plenty of food, and his family brings food over daily. He is drinking an Ensure twice a day. His weight is down 2 lbs today. Encouraged him to keep up the good work with his PO intake.   Pt confirms that he is taking his Cefadroxil, Zinc, and Potassium as prescribed. He brought all meds to clinic today for VAD coordinator review.   Dressing changed today in clinic. See documentation below for details.   UBC did not pass annual maintenance testing. Pt was given loaner UBC and a new UBC was ordered for him today.   Vital Signs:  Doppler Pressure: 84 Automatc BP: 107/96 (102) HR: 78 SPO2: 96%   Weight: 111.4 lb w/o eqt Last clinic weight: 113 lb w/o eqt   VAD Indication: Destination Therapy LVAD due to narcotic use   LVAD assessment: HM III: Speed:  5400 rpms                                 Flow: 3.7      Power: 3.9 w                                              PI: 5.6  Hct: 20 -- do not titrate                                   Alarms: none Events: 20-30  Fixed speed: 5400 Low speed limit: 5100  Primary Controller: Replace back up battery in 28 months Back up controller:   Replace back up battery in 7 months   I reviewed the LVAD parameters from today and compared the results to the patient's prior recorded data. LVAD interrogation was NEGATIVE for significant power changes, NEGATIVE for clinical alarms, and POSITIVE for PI events/speed drops. No programming changes were made and pump is functioning within specified parameters. Pt is performing daily controller and system monitor self tests along with completing weekly and monthly maintenance for LVAD equipment.   LVAD equipment check completed  and is in good working order. Back-up equipment present.   Annual Equipment Maintenance on UBC/PM was performed on 05/08/23.  Exit Site Care: Existing weekly VAD dressing C/D/I with anchor intact and accurately applied. Patient is changing weekly.  Existing VAD dressing removed and site care performed using sterile technique. [name], NP in to assess tiny abscess to right of driveline and right along the driveline. These areas were opened and drained. Drive line exit site cleaned with Chlora prep applicators x 2, allowed to dry, and rinsed with saline. Gauze dressing with Vashe moistened 2 x 2 placed in tiny abscess areas and covered with 4 x 4 and tape. Exit site healed and incorporated, the velour is exposed significantly at exit site. No redness, slight tenderness, scant brown drainage with foul odor, no rash noted. Drive line anchor re-applied. Pt denies fever or chills. Pt instructed to change driveline on Monday and return to VAD clinic on Thursday.  Device: N/A  BP & Labs:  Doppler BP 84 - Doppler is reflecting modified systolic   Hgb 10.7 -  No S/S of bleeding. Specifically denies melena/BRBPR or nosebleeds.   LDH 179 - with established baseline of 250 - 430. Denies tea-colored urine. No power elevations noted on interrogation.      Batteries Manufacture Date: Number of uses: Re-calibration  01/07/22 88 Performed by patient   Annual maintenance completed per [employer] on patient's home power module and Warehouse manager.    Backup system controller 11 volt battery charged during visit.   Patient Instructions: Take 4 extra potassium pills today only (80 mEq) for a potassium of 3.3 Return to clinic on Thursday for driveline check Return to clinic in 2 months.   Carlton Adam RN VAD Coordinator  Office: [phone]  24/7 Pager: [phone]

## 2023-05-08 NOTE — Progress Notes (Addendum)
 VAD CLINIC NOTE   Chief complaint: HF  HPI:  [name] is a 38 y/o male from [place]  w/ h/o severe systolic HF due to NICM EF 15% and prior polysubstance abuse (cocaine and ETOH)   Underwent HM-3 LVAD placement on 02/26/19 by Dr. Morton Peters. Required milrinone post op for RV support.  Admitted 6/21 for AKI and hypotension in setting of volume depletion due to GI bug.Had multiple flow alarms. Echo EF 40-45%   Admitted 12/21 for anemia in setting of supratherapeutic INR (8.0) and bleeding from a sore on his lip. Transfused but did not bleed any further. Developed refractory low flow alarms on VAD with drop in platelets and rising LDH. RUQ U/S was normal (doubt splenic sequestration). Peripheral smear showed no evidence of hemolysis.Patient placed on his left side and low flow alarms resolved. It was suspected possible obstruction/clot of outflow graft causing Plts and Hgb to drop. CT 12/10 showed no evidence of outflow graft kink/clot.  LDH trended back down to his baseline, below 200s. Plts started to trend back up.  Admitted 3/22 for MSSA DL infection requiring surgical debridement. D/c'd to [facility] for IV abx.    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 Wound cx grew MSSA. Treated with cefazolin. Discharged home on 03/16/21 with wound vac. ID recommended oral linezolid BID for 3 weeks at discharge then switch to chronic cefadroxil afterwards.  Here for routine follow-up. Feels good. Denies SOB. Says he is eating well but weight down. Off warfarin due to labile INRs. Denies orthopnea or PND. No fevers, chills or problems with driveline. No bleeding, melena or neuro symptoms. No VAD alarms. Taking all meds as prescribed.    VAD Indication: Destination Therapy LVAD due to narcotic use   LVAD assessment: HM III: Speed:  5400 rpms                                 Flow: 3.7                                 Power: 3.9  w                                               PI: 5.6   Hct: 20 -- do not titrate                                   Alarms: none Events: 20-30  Fixed speed: 5400 Low speed limit: 5100   Primary Controller: Replace back up battery in 28 months Back up controller:   Replace back up battery in 7 months   I reviewed the LVAD parameters from today and compared the results to the patient's prior recorded data. LVAD interrogation was NEGATIVE for significant power changes, NEGATIVE for clinical alarms, and POSITIVE for PI events/speed drops. No programming changes were made and pump is functioning within specified parameters. Pt is performing daily controller and system monitor self tests along with completing weekly and monthly maintenance for LVAD equipment.   LVAD equipment check completed and is in good working order. Back-up equipment present.  Annual Equipment Maintenance on UBC/PM was performed on 05/08/23.   Past Medical History:  Diagnosis Date   Dilated cardiomyopathy (HCC)    Dilated cardiomyopathy (HCC) 02/2019   Polysubstance abuse (HCC)     Current Outpatient Medications  Medication Sig Dispense Refill   cefadroxil (DURICEF) 500 MG capsule Take 1 capsule (500 mg total) by mouth 2 (two) times daily. 60 capsule 11   KLOR-CON M20 20 MEQ tablet TAKE 2 TABLETS (40 MEQ) BY MOUTH DAILY, OR AS DIRECTED BY HEART FAILURE CLINIC 180 tablet 3   Nutritional Supplements (ENSURE HIGH PROTEIN) LIQD Drink a high protein Ensure twice daily until instructed otherwise by Dr Gala Romney 237 mL 12   Zinc Sulfate 220 (50 Zn) MG TABS Take 1 tablet (220 mg total) by mouth daily. 90 tablet 3   No current facility-administered medications for this encounter.    Patient has no known allergies.    Vital Signs:    Doppler Pressure: 84 Automatc BP: 107/96 (102) HR: 78 SPO2: 96%   Weight: 111.4 lb w/o eqt Last clinic weight: 113 lb w/o eqt  Physical Exam: General:  Thin. NAD.  HEENT: normal  Neck: supple. JVP not elevated.   Carotids 2+ bilat; no bruits. No lymphadenopathy or thryomegaly appreciated. Cor: LVAD hum.  Lungs: Clear. Abdomen: soft, nontender, non-distended. No bruits or masses. Good bowel sounds. Driveline site with small areas of redness/swelling. Anchor in place.  Extremities: no cyanosis, clubbing, rash. Warm no edema  Neuro: alert & oriented x 3. No focal deficits. Moves all 4 without problem     ASSESSMENT AND PLAN:   1. Chronic systolic HF due to severe NICM s/p VAD - Echo EF 10% with biventricular failure - HM-3 VAD implant 02/26/19 - Echo 7/21 EF 40-45% - Stable NYHA I  - Volume ok - MAPs ok  2. Recurrent MSSA DL infection - Initial episode 3/22 - Recurrent infection 2/23 requiring debridement and wound vac.  - Site looks with two areas of possible infection. These were unroofed by [name], NP  - Continue suppressive cefadroxil  3. VAD - Admit 12/21 with multiple low flow alarms. Rising LDH and decreasing PLTs. Suspected outflow graft kink occlusion. Patient placed on his left side and resolved.  - VAD interrogated personally. Parameters stable. - DL site management as above - UBC did not pass annual maintenance testing. Given loaner.  - Hgb stable 10.7 - LDH 179 - Remains off warfarin due to labile INRs  4. HTN - MAPs ok  5. Elevated LFTs/thrombocytopenia - suspect related to cefuroxil - RUQ u/s 8/23 ok. Hepatitis panels negative - normalized  6. Weight loss -> protein-calorie malnutrition - has struggled with poor diet since his mother passed - Weight down again - Encouraged him to drink more Ensure  7. Hypokalemia - k 3.3. Will supp   I spent a total of 50 minutes today: 1) reviewing the patient's medical records including previous charts, labs and recent notes from other providers; 2) examining the patient and counseling them on their medical issues/explaining the plan of care; 3) adjusting meds as needed and 4) ordering lab work or other needed tests.    Arvilla Meres, MD  5:27 PM

## 2023-05-14 ENCOUNTER — Telehealth (HOSPITAL_COMMUNITY): Payer: Self-pay | Admitting: Licensed Clinical Social Worker

## 2023-05-14 NOTE — Telephone Encounter (Signed)
 CSW contcated patient to follow up on concerns shared that his medicaid was terminated. Patient states that he was able to get it fixed and is now active medicaid. Lasandra Beech, LCSW, CCSW-MCS [phone]

## 2023-05-15 ENCOUNTER — Ambulatory Visit (HOSPITAL_COMMUNITY)
Admission: RE | Admit: 2023-05-15 | Discharge: 2023-05-15 | Disposition: A | Discharge status: Home / Self Care | Source: Ambulatory Visit | Attending: Cardiology | Admitting: Cardiology

## 2023-05-15 DIAGNOSIS — Z95811 Presence of heart assist device: Secondary | ICD-10-CM | POA: Diagnosis present

## 2023-05-15 DIAGNOSIS — L02818 Cutaneous abscess of other sites: Secondary | ICD-10-CM | POA: Diagnosis not present

## 2023-05-15 DIAGNOSIS — T827XXA Infection and inflammatory reaction due to other cardiac and vascular devices, implants and grafts, initial encounter: Secondary | ICD-10-CM | POA: Diagnosis present

## 2023-05-15 DIAGNOSIS — T8141XA Infection following a procedure, superficial incisional surgical site, initial encounter: Secondary | ICD-10-CM | POA: Insufficient documentation

## 2023-05-15 DIAGNOSIS — Z4801 Encounter for change or removal of surgical wound dressing: Secondary | ICD-10-CM | POA: Insufficient documentation

## 2023-05-15 NOTE — Progress Notes (Signed)
 Patient presents for 2 mo f/u with annual main in VAD clinic today alone. Denies issues with VAD equipment or drive line.    Exit Site Care: Existing gauze VAD dressing barely on with anchor not attached. Existing VAD dressing removed and site care performed using sterile technique. Tiny superficial abscess beside the driveline drained and cultured today. Drive line exit site cleaned with Chlora prep applicators x 2, allowed to dry, and rinsed with saline. Gauze dressing with Vashe moistened 2 x 2 placed on tiny abscess areas and covered with 4 x 4 and tape. Exit site healed and incorporated, the velour is exposed significantly at exit site. No redness, slight tenderness, scant brown drainage with foul odor, no rash noted. Drive line anchor re-applied. Pt denies fever or chills. Pt instructed to change driveline on Monday and Thursday and to return in 2 weeks.       Patient Instructions: Please change dressing Mondays and Thursdays Return in 2 weeks for driveline check  Carlton Adam RN VAD Coordinator  Office: [phone]  24/7 Pager: [phone]

## 2023-05-15 NOTE — Patient Instructions (Addendum)
 Please change dressing Mondays and Thursdays Return in 2 weeks for driveline check

## 2023-05-16 ENCOUNTER — Telehealth (HOSPITAL_COMMUNITY): Payer: Self-pay | Admitting: Unknown Physician Specialty

## 2023-05-16 DIAGNOSIS — T827XXA Infection and inflammatory reaction due to other cardiac and vascular devices, implants and grafts, initial encounter: Secondary | ICD-10-CM

## 2023-05-16 MED ORDER — CIPROFLOXACIN HCL 750 MG PO TABS: 750.0000 mg | ORAL_TABLET | Freq: Two times a day (BID) | ORAL | 0 refills | Status: DC

## 2023-05-16 NOTE — Telephone Encounter (Signed)
 D/W Sarah with VAD team re: [name]'s driveline changes. Follow up today this was cultured due to persistent drainage and tenderness.   Patient continues Cefadroxil suppression 500 mg BID.  Culture preliminarily showing:   Results for orders placed or performed during the hospital encounter of 05/15/23  Aerobic Culture w Gram Stain (superficial specimen)     Status: None (Preliminary result)   Collection Time: 05/15/23  1:25 PM   Specimen: Wound  Result Value Ref Range Status   Specimen Description WOUND  Final   Special Requests NONE  Final   Gram Stain   Final    NO WBC SEEN RARE GRAM NEGATIVE RODS RARE GRAM POSITIVE COCCI IN PAIRS    Culture   Final    FEW ESCHERICHIA COLI FEW PROTEUS MIRABILIS CULTURE REINCUBATED FOR BETTER GROWTH Performed at [facility], [address], [place], [place] [address]    Report Status PENDING  Incomplete   Suspect E coli and Proteus likely resistant to cefadroxil in the setting of chronic use for known MSSA infections.  Keep Cefadroxil for now. Add Ciprofloxacin 750 mg BID.  **Follow culture - if GPC winds up growing out will increase cefadroxil temporarily to 1g BID as well.   Wound care and hygiene addressed in clinic today with the patient.   He is not on warfarin and has LVAD so not terribly concerned with QTc affect.   I asked VAD Team to please update me in 2 weeks when he returns. I will follow up culture again once finalized.   Total encounter time discussing with team regarding recommendations: 10 minute   Rexene Alberts, MSN, NP-C [facility]  [email or web address] Pager: [phone] Office: [phone] [facility] Main Line: [phone] *Secure Chat Communication Welcome

## 2023-05-16 NOTE — Telephone Encounter (Signed)
 Pts driveline abscess was cultured Tuesday and is growing proteus and E. Coli. Pt is currently taking Cefadroxil 500 bid. D/w Rexene Alberts, NP, will add Cipro 750 bid for 14 days. Pt is scheduled to see [place] in 2 weeks for a driveline check. Pt was instructed today to wash his hands really good with soap and water prior to changing his driveline. He assures me that he will do this. Pt also instructed that if his driveline becomes painful, or has increased drainage to call the VAD office immediately. Pt verbalized understanding.  Carlton Adam RN, BSN VAD Coordinator 24/7 Pager [phone]

## 2023-05-19 ENCOUNTER — Telehealth (HOSPITAL_COMMUNITY): Payer: Self-pay | Admitting: *Deleted

## 2023-05-19 ENCOUNTER — Other Ambulatory Visit (HOSPITAL_COMMUNITY): Payer: Self-pay | Admitting: *Deleted

## 2023-05-19 DIAGNOSIS — Z95811 Presence of heart assist device: Secondary | ICD-10-CM

## 2023-05-19 DIAGNOSIS — T827XXA Infection and inflammatory reaction due to other cardiac and vascular devices, implants and grafts, initial encounter: Secondary | ICD-10-CM

## 2023-05-19 LAB — AEROBIC CULTURE W GRAM STAIN (SUPERFICIAL SPECIMEN): Gram Stain: NONE SEEN

## 2023-05-19 MED ORDER — LEVOFLOXACIN 750 MG PO TABS
750.0000 mg | ORAL_TABLET | Freq: Every day | ORAL | 0 refills | Status: AC
Start: 2023-05-19 — End: 2023-06-18

## 2023-05-19 NOTE — Telephone Encounter (Signed)
 Have attempted x 4 to reach pt regarding antibiotic change. Voicemail is full, and unable to leave a message.   Paulo Bosworth RN VAD Coordinator  Office: [phone]  24/7 Pager: [phone]

## 2023-05-19 NOTE — Progress Notes (Signed)
 Abscess wound culture positive for: FEW ESCHERICHIA COLI , FEW PROTEUS MIRABILIS, FEW ENTEROCOCCUS FAECALIS.   Per recommendations from Gibson Kurtz NP with ID will STOP Cipro and start Levaquin 750 mg daily + continue Cefadroxil. Prescription sent to [facility] in [place].   Pt scheduled to return to clinic 05/27/23 for wound eval. Attempted to contact pt regarding the above medication change. He did not answer, and his voicemail is full. Will continue to attempt to reach pt.   Paulo Bosworth RN VAD Coordinator  Office: [phone]  24/7 Pager: [phone]

## 2023-05-20 ENCOUNTER — Telehealth (HOSPITAL_COMMUNITY): Payer: Self-pay | Admitting: Unknown Physician Specialty

## 2023-05-20 NOTE — Telephone Encounter (Signed)
 Unable to reach [name] on his phone and unable to leave a msg. VAD coordinator called emergency number in chart. Individual located [name] and brought him to the phone. [name] told to stop the Cipro and start Levaquin. He was instructed to continue the cefadroxil. Pt assures me he will be at his appt next week for a driveline check as the abscess is growing 3 separate organisms.   Adams Adams RN, BSN VAD Coordinator 24/7 Pager [phone]

## 2023-05-28 ENCOUNTER — Ambulatory Visit (HOSPITAL_COMMUNITY)
Admission: RE | Admit: 2023-05-28 | Discharge: 2023-05-28 | Disposition: A | Discharge status: Home / Self Care | Source: Ambulatory Visit | Attending: Cardiology | Admitting: Cardiology

## 2023-05-28 DIAGNOSIS — Z95811 Presence of heart assist device: Secondary | ICD-10-CM | POA: Insufficient documentation

## 2023-05-28 DIAGNOSIS — Z7901 Long term (current) use of anticoagulants: Secondary | ICD-10-CM | POA: Insufficient documentation

## 2023-05-28 DIAGNOSIS — Z4801 Encounter for change or removal of surgical wound dressing: Secondary | ICD-10-CM | POA: Diagnosis present

## 2023-05-28 NOTE — Progress Notes (Signed)
 Patient presents for 2 mo f/u with annual main in VAD clinic today alone. Denies issues with VAD equipment or drive line.   Abscess wound culture positive for: FEW ESCHERICHIA COLI , FEW PROTEUS MIRABILIS, FEW ENTEROCOCCUS FAECALIS.    Per recommendations from Gibson Kurtz NP with ID will STOP Cipro  and start Levaquin  750 mg daily + continue Cefadroxil . Pt confirms medication change at today's visit.    Exit Site Care: Existing VAD dressing removed and site care performed using sterile technique. Tiny superficial abscess beside the driveline drained and cultured today. Drive line exit site cleaned with Chlora prep applicators x 2, allowed to dry, and rinsed with saline. Gauze dressing with Vashe moistened 2 x 2 placed on tiny abscess areas and covered with 4 x 4 and tape. Exit site healed and incorporated, the velour is exposed significantly at exit site. No redness, slight tenderness, scant brown/green drainage with foul odor, no rash noted. Drive line anchor re-applied. Pt denies fever or chills. Pt instructed to change driveline on Monday and Thursday and to return in 2 weeks.       Patient Instructions: Please change dressing Mondays and Thursdays Return in 2 weeks for driveline check  Laurice Pope RN, BSN VAD Coordinator 24/7 Pager [phone]

## 2023-05-28 NOTE — Patient Instructions (Signed)
 Continue Monday Thursday dressing changes Return in 2 weeks for dressing change

## 2023-06-11 ENCOUNTER — Encounter (HOSPITAL_COMMUNITY)

## 2023-06-12 ENCOUNTER — Ambulatory Visit (HOSPITAL_COMMUNITY)
Admission: RE | Admit: 2023-06-12 | Discharge: 2023-06-12 | Disposition: A | Discharge status: Home / Self Care | Source: Ambulatory Visit | Attending: Internal Medicine | Admitting: Internal Medicine

## 2023-06-12 DIAGNOSIS — Z4509 Encounter for adjustment and management of other cardiac device: Secondary | ICD-10-CM | POA: Insufficient documentation

## 2023-06-12 DIAGNOSIS — Z95811 Presence of heart assist device: Secondary | ICD-10-CM | POA: Diagnosis not present

## 2023-06-12 NOTE — Progress Notes (Addendum)
 Patient presents for dressing change in VAD clinic today alone. Denies issues with VAD equipment or drive line.   Abscess wound culture positive for: FEW ESCHERICHIA COLI , FEW PROTEUS MIRABILIS, FEW ENTEROCOCCUS FAECALIS.    Per recommendations from Gibson Kurtz NP with ID will STOP Cipro  and start Levaquin  750 mg daily + continue Cefadroxil . Pt confirms taking medication as prescribed.    Exit Site Care: Existing VAD dressing removed and site care performed using sterile technique. Drive line exit site cleaned with Chlora prep applicators x 2, allowed to dry, and rinsed with saline. Gauze dressing with Vashe moistened 2 x 2 placed on tiny abscess areas and covered with 4 x 4 and tape. Exit site healed and incorporated, the velour is exposed significantly at exit site. Unable to express any drainage from previous superficial abscess. No redness, tenderness, scant brown drainage on previous dressing with minimal foul odor, no rash noted. Drive line anchor re-applied over tegaderm as pt states anchors do not stick to his skin. Pt denies fever or chills. Pt instructed to change driveline on Monday and Thursday. Provided with 7 daily kits, several small tegaderms, and 12 anchors for home use.       Patient Instructions: Please change dressing Mondays and Thursdays Return for previously scheduled appt  Paulo Bosworth RN VAD Coordinator  Office: [phone]  24/7 Pager: [phone]

## 2023-06-12 NOTE — Patient Instructions (Signed)
 Continue changing dressing on Mondays and Thursdays Return to clinic for previously scheduled appointment.

## 2023-07-03 ENCOUNTER — Other Ambulatory Visit (HOSPITAL_COMMUNITY): Payer: Self-pay | Admitting: *Deleted

## 2023-07-03 DIAGNOSIS — Z7901 Long term (current) use of anticoagulants: Secondary | ICD-10-CM

## 2023-07-03 DIAGNOSIS — Z95811 Presence of heart assist device: Secondary | ICD-10-CM

## 2023-07-03 DIAGNOSIS — I5022 Chronic systolic (congestive) heart failure: Secondary | ICD-10-CM

## 2023-07-04 ENCOUNTER — Encounter (HOSPITAL_COMMUNITY): Admitting: Internal Medicine

## 2023-07-04 ENCOUNTER — Encounter (HOSPITAL_COMMUNITY): Payer: Self-pay

## 2023-07-04 ENCOUNTER — Inpatient Hospital Stay (HOSPITAL_COMMUNITY)
Admission: AD | Admit: 2023-07-04 | Discharge: 2023-07-09 | DRG: 369 | Disposition: A | Discharge status: Home / Self Care | Source: Ambulatory Visit | Attending: Cardiology | Admitting: Cardiology

## 2023-07-04 DIAGNOSIS — E861 Hypovolemia: Secondary | ICD-10-CM | POA: Diagnosis present

## 2023-07-04 DIAGNOSIS — N179 Acute kidney failure, unspecified: Secondary | ICD-10-CM | POA: Diagnosis present

## 2023-07-04 DIAGNOSIS — E872 Acidosis, unspecified: Secondary | ICD-10-CM | POA: Diagnosis present

## 2023-07-04 DIAGNOSIS — B9561 Methicillin susceptible Staphylococcus aureus infection as the cause of diseases classified elsewhere: Secondary | ICD-10-CM | POA: Diagnosis present

## 2023-07-04 DIAGNOSIS — K922 Gastrointestinal hemorrhage, unspecified: Principal | ICD-10-CM | POA: Diagnosis present

## 2023-07-04 DIAGNOSIS — R7989 Other specified abnormal findings of blood chemistry: Secondary | ICD-10-CM

## 2023-07-04 DIAGNOSIS — Z5986 Financial insecurity: Secondary | ICD-10-CM

## 2023-07-04 DIAGNOSIS — K226 Gastro-esophageal laceration-hemorrhage syndrome: Principal | ICD-10-CM | POA: Diagnosis present

## 2023-07-04 DIAGNOSIS — Z79899 Other long term (current) drug therapy: Secondary | ICD-10-CM | POA: Diagnosis not present

## 2023-07-04 DIAGNOSIS — K5900 Constipation, unspecified: Secondary | ICD-10-CM | POA: Diagnosis present

## 2023-07-04 DIAGNOSIS — I420 Dilated cardiomyopathy: Secondary | ICD-10-CM | POA: Diagnosis present

## 2023-07-04 DIAGNOSIS — Z8249 Family history of ischemic heart disease and other diseases of the circulatory system: Secondary | ICD-10-CM

## 2023-07-04 DIAGNOSIS — K920 Hematemesis: Secondary | ICD-10-CM

## 2023-07-04 DIAGNOSIS — B379 Candidiasis, unspecified: Secondary | ICD-10-CM | POA: Diagnosis present

## 2023-07-04 DIAGNOSIS — D539 Nutritional anemia, unspecified: Secondary | ICD-10-CM | POA: Diagnosis present

## 2023-07-04 DIAGNOSIS — E876 Hypokalemia: Secondary | ICD-10-CM | POA: Diagnosis not present

## 2023-07-04 DIAGNOSIS — K449 Diaphragmatic hernia without obstruction or gangrene: Secondary | ICD-10-CM | POA: Diagnosis not present

## 2023-07-04 DIAGNOSIS — R112 Nausea with vomiting, unspecified: Secondary | ICD-10-CM | POA: Diagnosis present

## 2023-07-04 DIAGNOSIS — Z7901 Long term (current) use of anticoagulants: Secondary | ICD-10-CM

## 2023-07-04 DIAGNOSIS — D696 Thrombocytopenia, unspecified: Secondary | ICD-10-CM | POA: Diagnosis present

## 2023-07-04 DIAGNOSIS — Z8619 Personal history of other infectious and parasitic diseases: Secondary | ICD-10-CM | POA: Diagnosis not present

## 2023-07-04 DIAGNOSIS — K2981 Duodenitis with bleeding: Secondary | ICD-10-CM | POA: Diagnosis present

## 2023-07-04 DIAGNOSIS — F17290 Nicotine dependence, other tobacco product, uncomplicated: Secondary | ICD-10-CM | POA: Diagnosis present

## 2023-07-04 DIAGNOSIS — I429 Cardiomyopathy, unspecified: Secondary | ICD-10-CM | POA: Diagnosis not present

## 2023-07-04 DIAGNOSIS — Z95811 Presence of heart assist device: Secondary | ICD-10-CM | POA: Diagnosis not present

## 2023-07-04 DIAGNOSIS — K3189 Other diseases of stomach and duodenum: Secondary | ICD-10-CM | POA: Diagnosis present

## 2023-07-04 DIAGNOSIS — R825 Elevated urine levels of drugs, medicaments and biological substances: Secondary | ICD-10-CM

## 2023-07-04 DIAGNOSIS — Z5941 Food insecurity: Secondary | ICD-10-CM

## 2023-07-04 DIAGNOSIS — I5022 Chronic systolic (congestive) heart failure: Secondary | ICD-10-CM | POA: Diagnosis present

## 2023-07-04 DIAGNOSIS — I5082 Biventricular heart failure: Secondary | ICD-10-CM | POA: Diagnosis present

## 2023-07-04 DIAGNOSIS — Z5948 Other specified lack of adequate food: Secondary | ICD-10-CM

## 2023-07-04 DIAGNOSIS — B3781 Candidal esophagitis: Secondary | ICD-10-CM | POA: Diagnosis not present

## 2023-07-04 DIAGNOSIS — D649 Anemia, unspecified: Secondary | ICD-10-CM

## 2023-07-04 DIAGNOSIS — B964 Proteus (mirabilis) (morganii) as the cause of diseases classified elsewhere: Secondary | ICD-10-CM | POA: Diagnosis present

## 2023-07-04 DIAGNOSIS — K209 Esophagitis, unspecified without bleeding: Secondary | ICD-10-CM | POA: Diagnosis not present

## 2023-07-04 DIAGNOSIS — T827XXA Infection and inflammatory reaction due to other cardiac and vascular devices, implants and grafts, initial encounter: Secondary | ICD-10-CM | POA: Diagnosis not present

## 2023-07-04 DIAGNOSIS — R1110 Vomiting, unspecified: Secondary | ICD-10-CM | POA: Diagnosis not present

## 2023-07-04 LAB — COMPREHENSIVE METABOLIC PANEL WITH GFR
ALT: 34 U/L (ref 0–44)
AST: 73 U/L — ABNORMAL HIGH (ref 15–41)
Albumin: 4.9 g/dL (ref 3.5–5.0)
Alkaline Phosphatase: 62 U/L (ref 38–126)
Anion gap: 31 — ABNORMAL HIGH (ref 5–15)
BUN: 16 mg/dL (ref 6–20)
CO2: 11 mmol/L — ABNORMAL LOW (ref 22–32)
Calcium: 9.5 mg/dL (ref 8.9–10.3)
Chloride: 103 mmol/L (ref 98–111)
Creatinine, Ser: 2.06 mg/dL — ABNORMAL HIGH (ref 0.61–1.24)
GFR, Estimated: 42 mL/min — ABNORMAL LOW (ref >60)
Glucose, Bld: 151 mg/dL — ABNORMAL HIGH (ref 70–99)
Potassium: 4.5 mmol/L (ref 3.5–5.1)
Sodium: 145 mmol/L (ref 135–145)
Total Bilirubin: 2.1 mg/dL — ABNORMAL HIGH (ref 0.0–1.2)
Total Protein: 8.6 g/dL — ABNORMAL HIGH (ref 6.5–8.1)

## 2023-07-04 LAB — CBC WITH DIFFERENTIAL/PLATELET
Abs Immature Granulocytes: 0.05 10*3/uL (ref 0.00–0.07)
Basophils Absolute: 0 10*3/uL (ref 0.0–0.1)
Basophils Relative: 0 %
Eosinophils Absolute: 0 10*3/uL (ref 0.0–0.5)
Eosinophils Relative: 0 %
HCT: 36.3 % — ABNORMAL LOW (ref 39.0–52.0)
Hemoglobin: 12.1 g/dL — ABNORMAL LOW (ref 13.0–17.0)
Immature Granulocytes: 0 %
Lymphocytes Relative: 10 %
Lymphs Abs: 1.1 10*3/uL (ref 0.7–4.0)
MCH: 36 pg — ABNORMAL HIGH (ref 26.0–34.0)
MCHC: 33.3 g/dL (ref 30.0–36.0)
MCV: 108 fL — ABNORMAL HIGH (ref 80.0–100.0)
Monocytes Absolute: 1.6 10*3/uL — ABNORMAL HIGH (ref 0.1–1.0)
Monocytes Relative: 14 %
Neutro Abs: 8.9 10*3/uL — ABNORMAL HIGH (ref 1.7–7.7)
Neutrophils Relative %: 76 %
Platelets: 149 10*3/uL — ABNORMAL LOW (ref 150–400)
RBC: 3.36 MIL/uL — ABNORMAL LOW (ref 4.22–5.81)
RDW: 15.6 % — ABNORMAL HIGH (ref 11.5–15.5)
WBC: 11.8 10*3/uL — ABNORMAL HIGH (ref 4.0–10.5)
nRBC: 0 % (ref 0.0–0.2)

## 2023-07-04 LAB — BLOOD GAS, VENOUS
Acid-base deficit: 13.3 mmol/L — ABNORMAL HIGH (ref 0.0–2.0)
Bicarbonate: 11.9 mmol/L — ABNORMAL LOW (ref 20.0–28.0)
Drawn by: 42628
O2 Saturation: 57.2 %
Patient temperature: 37.1
pCO2, Ven: 26 mmHg — ABNORMAL LOW (ref 44–60)
pH, Ven: 7.27 (ref 7.25–7.43)
pO2, Ven: 37 mmHg (ref 32–45)

## 2023-07-04 LAB — CG4 I-STAT (LACTIC ACID)
Lactic Acid, Venous: 2.8 mmol/L (ref 0.5–1.9)
Lactic Acid, Venous: 5.6 mmol/L (ref 0.5–1.9)
Lactic Acid, Venous: 1.6 mmol/L (ref 0.5–1.9)

## 2023-07-04 LAB — VITAMIN B12: Vitamin B-12: 290 pg/mL (ref 180–914)

## 2023-07-04 LAB — IRON AND TIBC
Iron: 98 ug/dL (ref 45–182)
Saturation Ratios: 33 % (ref 17.9–39.5)
TIBC: 300 ug/dL (ref 250–450)
UIBC: 202 ug/dL

## 2023-07-04 LAB — PROTIME-INR
INR: 1 (ref 0.8–1.2)
Prothrombin Time: 13.8 s (ref 11.4–15.2)

## 2023-07-04 LAB — FOLATE: Folate: 6.2 ng/mL (ref >5.9)

## 2023-07-04 LAB — HIV ANTIBODY (ROUTINE TESTING W REFLEX): HIV Screen 4th Generation wRfx: NONREACTIVE

## 2023-07-04 LAB — EXPECTORATED SPUTUM ASSESSMENT W GRAM STAIN, RFLX TO RESP C

## 2023-07-04 LAB — FERRITIN: Ferritin: 204 ng/mL (ref 24–336)

## 2023-07-04 LAB — LACTATE DEHYDROGENASE: LDH: 240 U/L — ABNORMAL HIGH (ref 98–192)

## 2023-07-04 LAB — MRSA NEXT GEN BY PCR, NASAL: MRSA by PCR Next Gen: NOT DETECTED

## 2023-07-04 MED ORDER — SODIUM CHLORIDE 0.9 % IV SOLN
2.0000 g | Freq: Four times a day (QID) | INTRAVENOUS | Status: DC
  Administered 2023-07-04 – 2023-07-07 (×12): 2 g via INTRAVENOUS
  Filled 2023-07-04 (×15): qty 2000

## 2023-07-04 MED ORDER — SODIUM CHLORIDE 0.9 % IV SOLN
1.0000 g | Freq: Four times a day (QID) | INTRAVENOUS | Status: DC
  Filled 2023-07-04: qty 1000

## 2023-07-04 MED ORDER — ONDANSETRON HCL 4 MG/2ML IJ SOLN
4.0000 mg | Freq: Four times a day (QID) | INTRAMUSCULAR | Status: DC | PRN
  Administered 2023-07-05: 4 mg via INTRAVENOUS
  Filled 2023-07-04 (×2): qty 2

## 2023-07-04 MED ORDER — ACETAMINOPHEN 325 MG PO TABS: 650.0000 mg | ORAL_TABLET | ORAL | Status: DC | PRN

## 2023-07-04 MED ORDER — PANTOPRAZOLE SODIUM 40 MG IV SOLR
40.0000 mg | Freq: Two times a day (BID) | INTRAVENOUS | Status: DC
  Administered 2023-07-04 – 2023-07-09 (×11): 40 mg via INTRAVENOUS
  Filled 2023-07-04 (×11): qty 10

## 2023-07-04 MED ORDER — LACTATED RINGERS IV BOLUS
1000.0000 mL | Freq: Once | INTRAVENOUS | Status: AC
  Administered 2023-07-04: 1000 mL via INTRAVENOUS

## 2023-07-04 MED ORDER — SODIUM CHLORIDE 0.9 % IV SOLN: 2.0000 g | INTRAVENOUS | Status: DC

## 2023-07-04 NOTE — Progress Notes (Signed)
 LVAD Coordinator Rounding Note:  Admitted 02/26/21 to Heart Failure service for possible GIB.   HM III LVAD implanted on 02/26/19 by Dr. Jeb Miner under Destination Therapy criteria.  Patient presented to ER at [facility] in [place] with vomiting - coffee ground like and diarrhea. Pt was + for THC and cocaine.  Pt has been off coumadin  for years.   Vital signs: Temp: 97.8 HR: 118 Doppler Pressure: 88 Automatic BP: 140/105 (111) O2 Sat: 100% RA Wt:  lbs  LVAD interrogation reveals:  Speed: 5400 Flow: 3.9 Power:  4w PI:  6.1 Hct: 20 - DO NOT CHANGE   Alarms: none Events: 30-40 PI events daily  Fixed speed: 5400 Low speed limit: 5100  Drive Line:  Existing VAD dressing removed and site care performed using sterile technique. Drive line exit site cleaned with Chlora prep applicators x 2, allowed to dry, and gauze dressing applied. Exit site healed and incorporated, the velour is exposed significantly at exit site. No redness, slight tenderness, small amount of brown drainage with foul odor and no rash noted. Culture obtained today. Drive line anchor re-applied. Pt has been changing once a week. We will change here in hospital MWF. Next dressing due 07/07/23.      Labs:  LDH trend: 240  Lactic acid:   Anticoagulation Plan: -INR Goal: not on coumadin  -ASA Dose: none  Blood Products:   Device: N/A  Infection:  07/04/23 Drive line wound culture>>pending 07/04/23 Blood cultures >>pending  Drips:  Protonix    Plan/Recommendations:  1. Call VAD Coordinator if any VAD equipment or drive line issues. 2. Driveline dressing changes MWF per bedside nurse. Next dressing change due 07/07/23.  Adams Adams RN VAD Coordinator  Office: [phone]  24/7 Pager: [phone]

## 2023-07-04 NOTE — Consult Note (Addendum)
 Consultation Note   Referring Provider:  Heart Failure Team PCP: Maryanne Smiles, MD Primary Gastroenterologist: Para Bold       Reason for Consultation: Hematemesis DOA: 07/04/2023         Hospital Day: 1   ASSESSMENT    38 year old male with chronic systolic heart failure secondary to severe nonischemic cardiomyopathy and prior polysubstance abuse (cocaine and alcohol ), status post LVAD January 2021 transferred from [facility] in [place]  with N/V and coffee ground emesis.   Etiology of acute vomiting unclear but maybe gastroenteritis or may PUD. Initially emesis was not dark so the eventual coffee ground emesis could have been caused by erosive esophagitis or a  Mallory Weiss tear. Gastric AVMs don't cause N/V but can be a source of bleeding.  Ischemic process due to cocaine use seems unlikely. BUN is pending. He doesn't have melena so this is reassuring   Chronic macrocytic anemia.  Hgb reportedly mid 13 range in ED at [place]  today but ? Elevated due to volume contraction from vomiting as it generally runs in mid 10 range. After 250 cc bolus of IVF his hgb is now 12.1. Expect hgb may decline further as he received another bolus of IVF  Acute thrombocytopenia Platelets stable at 149  See PMH for additional history  Principal Problem:   GI bleed    PLAN:   --Trend H/H --Continue BID IV PPI --Anti-emetics as needed --Clear liquids --May need EGD at some point.  --B12, folate, iron studies  HPI   38 y.o. year old male with a medical history including but not limited to severe systolic heart failure due to nonischemic cardiomyopathy and prior polysubstance abuse (cocaine and alcohol).  He is status post LVAD placement January 2021.  He lives in [address] [place]  but followed in [place] by our heart failure team. Patient was hospitalized here in 2022 with acute on chronic anemia without overt GI bleeding.   Evaluated by Dr. Nickey Barn. Endoscopic evaluation wasn't done.   Patient began having acute N/V with non-bloody emesis yesterday around 1 pm ( upon waking up for the day). Initially vomited juice / lemonade he had consumed. Vomiting continued and around 1 am today he noticed the emesis had turned dark brown. He went to [facility] for evaluation. According to our  H+P today, labs there significant for WBC 13.8, Hgb 13.2, Na 146, bicarb 8, lactic acid 9.2. UDS positive for cannabinoids and cocaine. CT A/P without findings to explain hematemesis. He was given 80 mg IV protonix  and 250 cc bolus of IVF. He hasn't had any associated abdominal pain. He does describe some mild discomfort near driveline site but tells me he hasn't had any drainage at site at home.  He doesn't take NSAIDs. He is not anticoagulated.   INR 1.0 WBC 11.8 WBC 12.1 MCV 108 Platelets 149   Past Medical History:  Diagnosis Date   Dilated cardiomyopathy (HCC)    Dilated cardiomyopathy (HCC) 02/2019   Polysubstance abuse (HCC)     Past Surgical History:  Procedure Laterality Date   APPLICATION OF A-CELL OF CHEST/ABDOMEN  03/06/2021   Procedure: APPLICATION OF A-CELL OF CHEST/ABDOMEN;  Surgeon: Shon Downing, MD;  Location: [facility] OR;  Service: Thoracic;;  APPLICATION OF WOUND VAC Left 05/16/2020   Procedure: APPLICATION OF WOUND VAC;  Surgeon: Bartley Lightning, MD;  Location: [facility] OR;  Service: Vascular;  Laterality: Left;   APPLICATION OF WOUND VAC N/A 03/02/2021   Procedure: APPLICATION OF WOUND VAC;  Surgeon: Shon Downing, MD;  Location: [facility] OR;  Service: Thoracic;  Laterality: N/A;   APPLICATION OF WOUND VAC N/A 03/06/2021   Procedure: APPLICATION OF WOUND VAC;  Surgeon: Shon Downing, MD;  Location: [facility] OR;  Service: Thoracic;  Laterality: N/A;   APPLICATION OF WOUND VAC N/A 03/12/2021   Procedure: WOUND VAC CHANGE;  Surgeon: Shon Downing, MD;  Location: [facility] OR;  Service: Thoracic;  Laterality: N/A;   INCISION AND  DRAINAGE OF WOUND Left 05/16/2020   Procedure: IRRIGATION AND DEBRIDEMENT VAD DRIVELINE WOUND;  Surgeon: Bartley Lightning, MD;  Location: [facility] OR;  Service: Vascular;  Laterality: Left;   INSERTION OF IMPLANTABLE LEFT VENTRICULAR ASSIST DEVICE N/A 02/26/2019   Procedure: INSERTION OF IMPLANTABLE LEFT VENTRICULAR ASSIST DEVICE;  Surgeon: Heriberto London, MD;  Location: [facility] OR;  Service: Open Heart Surgery;  Laterality: N/A;  HM3   IR THORACENTESIS ASP PLEURAL SPACE W/IMG GUIDE  03/10/2019   MULTIPLE EXTRACTIONS WITH ALVEOLOPLASTY N/A 05/04/2020   Procedure: MULTIPLE EXTRACTION TEETH FIVE AND FIFTEEN WITH ALVEOLOPLASTY;  Surgeon: Rene Carrier, DMD;  Location: [facility] OR;  Service: Dentistry;  Laterality: N/A;   RIGHT HEART CATH N/A 02/25/2019   Procedure: RIGHT HEART CATH;  Surgeon: Mardell Shade, MD;  [facility]: [facility] INVASIVE [facility];  Service: Cardiovascular;  Laterality: N/A;   RIGHT/LEFT HEART CATH AND CORONARY ANGIOGRAPHY N/A 02/11/2019   Procedure: RIGHT/LEFT HEART CATH AND CORONARY ANGIOGRAPHY;  Surgeon: Mardell Shade, MD;  [facility]: [facility];  Service: Cardiovascular;  Laterality: N/A;   STERNAL WOUND DEBRIDEMENT N/A 03/02/2021   Procedure: VAD DRIVELINE WOUND DEBRIDEMENT;  Surgeon: Shon Downing, MD;  Location: [facility] OR;  Service: Thoracic;  Laterality: N/A;   STERNAL WOUND DEBRIDEMENT N/A 03/06/2021   Procedure: VAD DRIVELINE WOUND DEBRIDEMENT;  Surgeon: Shon Downing, MD;  Location: [facility];  Service: Thoracic;  Laterality: N/A;   STERNAL WOUND DEBRIDEMENT N/A 03/12/2021   Procedure: VAD DRIVELINE WOUND DEBRIDEMENT;  Surgeon: Shon Downing, MD;  Location: [facility] OR;  Service: Thoracic;  Laterality: N/A;   TEE WITHOUT CARDIOVERSION N/A 02/26/2019   Procedure: TRANSESOPHAGEAL ECHOCARDIOGRAM (TEE);  Surgeon: Matt Song, Donata Fryer, MD;  Location: [facility] OR;  Service: Open Heart Surgery;  Laterality: N/A;   VIDEO BRONCHOSCOPY N/A 02/26/2019   Procedure: Video Bronchoscopy;  Surgeon: Heriberto London,  MD;  Location: [facility] OR;  Service: Open Heart Surgery;  Laterality: N/A;    Family History  Problem Relation Age of Onset   CAD Father     Prior to Admission medications   Medication Sig Start Date End Date Taking? Authorizing Provider  KLOR-CON  M20 20 MEQ tablet TAKE 2 TABLETS (40 MEQ) BY MOUTH DAILY, OR AS DIRECTED BY HEART FAILURE CLINIC 09/12/22  Yes Bensimhon, Rheta Celestine, MD    Current Facility-Administered Medications  Medication Dose Route Frequency Provider Last Rate Last Admin   acetaminophen  (TYLENOL ) tablet 650 mg  650 mg Oral Q4H PRN Arleene Belt, PA-C       ampicillin (OMNIPEN) 2 g in sodium chloride  0.9 % 100 mL IVPB  2 g Intravenous Q6H Stoner, Benjamin J, MD       ondansetron  (ZOFRAN ) injection 4 mg  4 mg Intravenous Q6H PRN Arleene Belt, PA-C  pantoprazole  (PROTONIX ) injection 40 mg  40 mg Intravenous Q12H Lauralee Poll, MD        Allergies as of 07/04/2023   (No Known Allergies)    Social History   Socioeconomic History   Marital status: Single    Spouse name: Not on file   Number of children: Not on file   Years of education: Not on file   Highest education level: Not on file  Occupational History   Not on file  Tobacco Use   Smoking status: Former    Types: Cigarettes   Smokeless tobacco: Never  Vaping Use   Vaping status: Some Days  Substance and Sexual Activity   Alcohol use: Yes   Drug use: Not Currently    Comment: Hx of cocaine use   Sexual activity: Not on file  Other Topics Concern   Not on file  Social History Narrative   Not on file   Social Drivers of Health   Financial Resource Strain: High Risk (07/04/2022)   Overall Financial Resource Strain (CARDIA)    Difficulty of Paying Living Expenses: Hard  Food Insecurity: Food Insecurity Present (07/04/2022)   Hunger Vital Sign    Worried About Running Out of Food in the Last Year: Sometimes true    Ran Out of Food in the Last Year: Sometimes true  Transportation  Needs: No Transportation Needs (07/04/2022)   PRAPARE - Administrator, Civil Service (Medical): No    Lack of Transportation (Non-Medical): No  Physical Activity: Sufficiently Active (03/05/2019)   Exercise Vital Sign    Days of Exercise per Week: 7 days    Minutes of Exercise per Session: 60 min  Stress: Not on file  Social Connections: Not on file  Intimate Partner Violence: Not on file     Code Status   Code Status: Partial Code  Review of Systems: All systems reviewed and negative except where noted in HPI.  Physical Exam: Vital signs in last 24 hours: Temp:  [98.6 F (37 C)] 98.6 F (37 C) (05/30 1345) Pulse Rate:  [116-150] 116 (05/30 1445) Resp:  [17-24] 24 (05/30 1445) BP: (100-147)/(81-111) 114/90 (05/30 1445) SpO2:  [100 %] 100 % (05/30 1445)    General:  Pleasant thin male in NAD Psych:  Cooperative. Normal mood and affect Eyes: Pupils equal Ears:  Normal auditory acuity Nose: No deformity, discharge or lesions Neck:  Supple, no masses felt Lungs:  Clear to auscultation.  Heart:  Regular rate, regular rhythm. LVAD hum Abdomen:  Soft, nondistended, nontender, active bowel sounds, no masses felt Rectal :  Deferred Msk: Symmetrical without gross deformities.  Neurologic:  Alert, oriented, grossly normal neurologically Extremities : No edema Skin:  Intact without significant lesions.    Intake/Output from previous day: No intake/output data recorded. Intake/Output this shift:  No intake/output data recorded.   Mai Schwalbe, NP-C   07/04/2023, 3:02 PM  I have taken an interval history, thoroughly reviewed the chart and examined the patient. I agree with the Advanced Practitioner's note, impression and recommendations, and have recorded additional findings, impressions and recommendations below. I performed a substantive portion of this encounter (>50% time spent), including a complete performance of the medical decision making.  My  additional thoughts are as follows:  Acute onset nausea and vomiting with positive UDS for THC and cocaine.  Nonobstructive on reports of outside imaging, possibly infectious.  Protracted vomiting all day led to development of coffee-ground's emesis overnight into this morning, transfer from  outside hospital because of LVAD.  Elevated creatinine Reassuring hemoglobin  AST elevation, normal ALT, total bilirubin 2.1 in the setting of chronic heart failure  No current plans for upper endoscopy.  Probably benign gastric or esophageal mucosal bleeding from protracted vomiting.  Endoscopic procedures would be high risk and an LVAD patient.  If he shows signs of brisk GI bleeding this can be reconsidered.  Recommend twice daily PPI, antiemetics, and hospital observation.  Will follow  Kerby Pearson III [phone]

## 2023-07-04 NOTE — H&P (Addendum)
 Advanced Heart Failure VAD History and Physical Note   HF Cardiologist: Dr. Julane Ny  Reason for Admission: Upper GI bleed and driveline infection  HPI:    [name] is a 38 y/o male from [place]  w/ h/o severe systolic HF due to NICM EF 15% and prior polysubstance abuse (cocaine and ETOH)   Underwent HM-3 LVAD placement on 02/26/19 by Dr. Eleno Griffins. Required milrinone  post op for RV support.   Admitted 12/21 for anemia in setting of supratherapeutic INR (8.0) and bleeding from a sore on his lip. Transfused but did not bleed any further. Developed refractory low flow alarms on VAD with drop in platelets and rising LDH. RUQ U/S was normal (doubt splenic sequestration). Peripheral smear showed no evidence of hemolysis. Patient placed on his left side and low flow alarms resolved. It was suspected possible obstruction/clot of outflow graft causing Plts and Hgb to drop. CT 12/10 showed no evidence of outflow graft kink/clot.    Admitted 3/22 for MSSA DL infection requiring surgical debridement. D/c'd to [facility] for IV abx.    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 Wound cx grew MSSA. Treated with cefazolin . Discharged home on 03/16/21 with wound vac. ID recommended oral linezolid  BID for 3 weeks at discharge then switch to chronic cefadroxil  afterwards.   Abscess at driveline site drained and cultured in clinic on 04/10. Culture grewn E Coli, Proteus and Entreococcus faecalis. Levaquin  added to chronic cefadroxil .  Presented to [facility] around 3 am this am with complaints of coffee-ground emesis that started just prior to arrival. States he has been well otherwise. He was given 80 mg protonix  IV and started on drop. Labs significnat for WBC 13.8, hgb 13.2, creatinine 1.2, Cl 95, Na 146, bicarb 8, Lactic acid 9.2>3.2, UDS + for cannabinoids and cocaine.  CT A/P with no findings to explain hematemesis. Given 250 cc bolus IVF and transferred to [facility]  for workup/management of GI bleed.    LVAD INTERROGATION:  HeartMate II LVAD:  Flow 4.1 liters/min, speed 5400, power 4.2, PI 5.  33 PI events so far today.   Home Medications Prior to Admission medications   Medication Sig Start Date End Date Taking? Authorizing Provider  cefadroxil  (DURICEF) 500 MG capsule Take 1 capsule (500 mg total) by mouth 2 (two) times daily. 09/25/22   Bensimhon, Rheta Celestine, MD  KLOR-CON  M20 20 MEQ tablet TAKE 2 TABLETS (40 MEQ) BY MOUTH DAILY, OR AS DIRECTED BY HEART FAILURE CLINIC 09/12/22   Bensimhon, Rheta Celestine, MD  Nutritional Supplements (ENSURE HIGH PROTEIN) LIQD Drink a high protein Ensure twice daily until instructed otherwise by Dr Bensimhon 11/29/22   Bensimhon, Rheta Celestine, MD  Zinc  Sulfate 220 (50 Zn) MG TABS Take 1 tablet (220 mg total) by mouth daily. 04/09/21   Bensimhon, Rheta Celestine, MD    Past Medical History: Past Medical History:  Diagnosis Date   Dilated cardiomyopathy (HCC)    Dilated cardiomyopathy (HCC) 02/2019   Polysubstance abuse (HCC)     Past Surgical History: Past Surgical History:  Procedure Laterality Date   APPLICATION OF A-CELL OF CHEST/ABDOMEN  03/06/2021   Procedure: APPLICATION OF A-CELL OF CHEST/ABDOMEN;  Surgeon: Shon Downing, MD;  Location: [facility] OR;  Service: Thoracic;;   APPLICATION OF WOUND VAC Left 05/16/2020   Procedure: APPLICATION OF WOUND VAC;  Surgeon: Bartley Lightning, MD;  Location: [facility] OR;  Service: Vascular;  Laterality: Left;   APPLICATION OF WOUND VAC N/A  03/02/2021   Procedure: APPLICATION OF WOUND VAC;  Surgeon: Shon Downing, MD;  Location: [facility] OR;  Service: Thoracic;  Laterality: N/A;   APPLICATION OF WOUND VAC N/A 03/06/2021   Procedure: APPLICATION OF WOUND VAC;  Surgeon: Shon Downing, MD;  Location: [facility];  Service: Thoracic;  Laterality: N/A;   APPLICATION OF WOUND VAC N/A 03/12/2021   Procedure: WOUND VAC CHANGE;  Surgeon: Shon Downing, MD;  Location: [facility];  Service: Thoracic;  Laterality: N/A;   INCISION  AND DRAINAGE OF WOUND Left 05/16/2020   Procedure: IRRIGATION AND DEBRIDEMENT VAD DRIVELINE WOUND;  Surgeon: Bartley Lightning, MD;  Location: [facility] OR;  Service: Vascular;  Laterality: Left;   INSERTION OF IMPLANTABLE LEFT VENTRICULAR ASSIST DEVICE N/A 02/26/2019   Procedure: INSERTION OF IMPLANTABLE LEFT VENTRICULAR ASSIST DEVICE;  Surgeon: Heriberto London, MD;  Location: [facility] OR;  Service: Open Heart Surgery;  Laterality: N/A;  HM3   IR THORACENTESIS ASP PLEURAL SPACE W/IMG GUIDE  03/10/2019   MULTIPLE EXTRACTIONS WITH ALVEOLOPLASTY N/A 05/04/2020   Procedure: MULTIPLE EXTRACTION TEETH FIVE AND FIFTEEN WITH ALVEOLOPLASTY;  Surgeon: Rene Carrier, DMD;  Location: [facility] OR;  Service: Dentistry;  Laterality: N/A;   RIGHT HEART CATH N/A 02/25/2019   Procedure: RIGHT HEART CATH;  Surgeon: Mardell Shade, MD;  [facility]: [facility] INV[facility];  Service: Cardiovascular;  Laterality: N/A;   RIGHT/LEFT HEART CATH AND CORONARY ANGIOGRAPHY N/A 02/11/2019   Procedure: RIGHT/LEFT HEART CATH AND CORONARY ANGIOGRAPHY;  Surgeon: Mardell Shade, MD;  [facility];  Service: Cardiovascular;  Laterality: N/A;   STERNAL WOUND DEBRIDEMENT N/A 03/02/2021   Procedure: VAD DRIVELINE WOUND DEBRIDEMENT;  Surgeon: Shon Downing, MD;  [facility]: [facility];  Service: Thoracic;  Laterality: N/A;   STERNAL WOUND DEBRIDEMENT N/A 03/06/2021   Procedure: VAD DRIVELINE WOUND DEBRIDEMENT;  Surgeon: Shon Downing, MD;  [facility]: [facility];  Service: Thoracic;  Laterality: N/A;   STERNAL WOUND DEBRIDEMENT N/A 03/12/2021   Procedure: VAD DRIVELINE WOUND DEBRIDEMENT;  Surgeon: Shon Downing, MD;  [facility]: [facility];  Service: Thoracic;  Laterality: N/A;   TEE WITHOUT CARDIOVERSION N/A 02/26/2019   Procedure: TRANSESOPHAGEAL ECHOCARDIOGRAM (TEE);  Surgeon: Matt Song, Donata Fryer, MD;  Location: [facility] OR;  Service: Open Heart Surgery;  Laterality: N/A;   VIDEO BRONCHOSCOPY N/A 02/26/2019   Procedure: Video Bronchoscopy;  Surgeon: Heriberto London, MD;  Location: [facility] OR;  Service: Open Heart Surgery;  Laterality: N/A;    Family History: Family History  Problem Relation Age of Onset   CAD Father     Social History: Social History   Socioeconomic History   Marital status: Single    Spouse name: Not on file   Number of children: Not on file   Years of education: Not on file   Highest education level: Not on file  Occupational History   Not on file  Tobacco Use   Smoking status: Former    Types: Cigarettes   Smokeless tobacco: Never  Vaping Use   Vaping status: Some Days  Substance and Sexual Activity   Alcohol use: Yes   Drug use: Not Currently    Comment: Hx of cocaine use   Sexual activity: Not on file  Other Topics Concern   Not on file  Social History Narrative   Not on file   Social Drivers of Health   Financial Resource Strain: High Risk (07/04/2022)   Overall Financial Resource Strain (CARDIA)    Difficulty of Paying Living Expenses: Hard  Food Insecurity: Food Insecurity Present (07/04/2022)   Hunger Vital Sign    Worried About Running Out of Food in the Last Year: Sometimes true    Ran Out of Food in the Last Year: Sometimes true  Transportation Needs: No Transportation Needs (07/04/2022)   PRAPARE - Administrator, Civil Service (Medical): No    Lack of Transportation (Non-Medical): No  Physical Activity: Sufficiently Active (03/05/2019)   Exercise Vital Sign    Days of Exercise per Week: 7 days    Minutes of Exercise per Session: 60 min  Stress: Not on file  Social Connections: Not on file    Allergies:  No Known Allergies  Objective:    Vital Signs:   Temp:  [98.6 F (37 C)] 98.6 F (37 C) (05/30 1345) Pulse Rate:  [116-150] 116 (05/30 1445) Resp:  [17-24] 24 (05/30 1445) BP: (100-147)/(81-111) 114/90 (05/30 1445) SpO2:  [100 %] 100 % (05/30 1445)   There were no vitals filed for this visit.  Mean arterial Pressure 80s-90s  Physical Exam    General:  Fatigued  appearing. Thin male. Neck: no JVD Cor: Mechanical heart sounds with LVAD hum present. Lungs: Clear Abdomen: soft, nontender, nondistended.  Driveline: C/D/I; securement device intact and driveline incorporated Extremities: no edema Neuro: alert & orientedx3. Affect pleasant   Telemetry   ? AFL vs sinus tach 150s  Labs    Basic Metabolic Panel: No results for input(s): "NA", "K", "CL", "CO2", "GLUCOSE", "BUN", "CREATININE", "CALCIUM", "MG", "PHOS" in the last 168 hours.  Liver Function Tests: No results for input(s): "AST", "ALT", "ALKPHOS", "BILITOT", "PROT", "ALBUMIN " in the last 168 hours. No results for input(s): "LIPASE", "AMYLASE" in the last 168 hours. No results for input(s): "AMMONIA" in the last 168 hours.  CBC: No results for input(s): "WBC", "NEUTROABS", "HGB", "HCT", "MCV", "PLT" in the last 168 hours.  Cardiac Enzymes: No results for input(s): "CKTOTAL", "CKMB", "CKMBINDEX", "TROPONINI" in the last 168 hours.  BNP: BNP (last 3 results) No results for input(s): "BNP" in the last 8760 hours.  ProBNP (last 3 results) No results for input(s): "PROBNP" in the last 8760 hours.   CBG: No results for input(s): "GLUCAP" in the last 168 hours.  Coagulation Studies: No results for input(s): "LABPROT", "INR" in the last 72 hours.    Imaging    No results found.    Patient Profile:   38 y.o. male with history of chronic systolic CHF/NICM s/p HM III VAD, hx driveline infection, hx polysubstance abuse.  Admitted with upper GI bleed    Assessment/Plan:    Upper GI bleed - Recurrent episodes of hematemesis starting around 2 am this morning -Dry on exam and tachycardic. Give 1L bolus LR now.  -Hgb 13.9 at [facility]. Recheck CBC, type and cross. -GI consult -Continue IV protonix   2. Chronic systolic HF due to severe NICM s/p VAD - Echo EF 10% with biventricular failure - HM-3 VAD implant 02/26/19 - Echo 7/21 EF 40-45% - Volume depleted. Give 1L LR as  above.   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 Initial episode 3/22. Recurrent infection 2/23 requiring debridement and wound vac.  - Grew E. Coli, Proteus, and E Faecalis on cultures in 04/25. Has been on chronic suppressive cefadroxil  and was started on levaquin  following recent culture results - WBCs 13.8K at OSH. Recheck CBC with diff. Start ampicillin. Obtain blood cultures. Driveline dressing is dirty.   4. VAD - Admit 12/21 with multiple low flow alarms.  Rising LDH and decreasing PLTs. Suspected outflow graft kink occlusion. Patient placed on his left side and resolved.  - VAD interrogated personally. Parameters stable. - DL site management as above - Check LDH - Remains off warfarin due to labile INRs   5. Lactic acidosis - Lactic acid  9.2>>3.2>2.8 - Bicarb 8 on outside lab, check VBG and CMET - Suspect d/t significant volume depletion +/- driveline infection. Improving with IVF. - Management of driveline as above.  6. Hx substance abuse (cocaine and ETOH) - UDS + for cannibinoids and cocaine at OSH - Denies cocaine use  I reviewed the LVAD parameters from today, and compared the results to the patient's prior recorded data.  No programming changes were made.  The LVAD is functioning within specified parameters.  The patient performs LVAD self-test daily.  LVAD interrogation was negative for any significant power changes, alarms or PI events/speed drops.  LVAD equipment check completed and is in good working order.  Back-up equipment present.   LVAD education done on emergency procedures and precautions and reviewed exit site care.  Length of Stay: 0  Pinchos Topel, Angelena Barber, PA-C 07/04/2023, 3:16 PM    VAD Team Pager [phone] (7am - 7am) +++VAD ISSUES ONLY+++   Advanced Heart Failure Team Pager [phone] (M-F; 7a - 5p)  Please contact [facility] for night-coverage after hours (5p -7a ) and weekends on [email or web address] for all non- LVAD Issues

## 2023-07-05 DIAGNOSIS — K922 Gastrointestinal hemorrhage, unspecified: Secondary | ICD-10-CM

## 2023-07-05 DIAGNOSIS — D696 Thrombocytopenia, unspecified: Secondary | ICD-10-CM | POA: Diagnosis not present

## 2023-07-05 DIAGNOSIS — R7989 Other specified abnormal findings of blood chemistry: Secondary | ICD-10-CM | POA: Diagnosis not present

## 2023-07-05 DIAGNOSIS — Z95811 Presence of heart assist device: Secondary | ICD-10-CM | POA: Diagnosis not present

## 2023-07-05 DIAGNOSIS — R1110 Vomiting, unspecified: Secondary | ICD-10-CM | POA: Diagnosis not present

## 2023-07-05 DIAGNOSIS — D539 Nutritional anemia, unspecified: Secondary | ICD-10-CM | POA: Diagnosis not present

## 2023-07-05 LAB — BASIC METABOLIC PANEL WITH GFR
Anion gap: 20 — ABNORMAL HIGH (ref 5–15)
BUN: 15 mg/dL (ref 6–20)
CO2: 17 mmol/L — ABNORMAL LOW (ref 22–32)
Calcium: 9.3 mg/dL (ref 8.9–10.3)
Chloride: 108 mmol/L (ref 98–111)
Creatinine, Ser: 1.67 mg/dL — ABNORMAL HIGH (ref 0.61–1.24)
GFR, Estimated: 54 mL/min — ABNORMAL LOW (ref >60)
Glucose, Bld: 88 mg/dL (ref 70–99)
Potassium: 3.8 mmol/L (ref 3.5–5.1)
Sodium: 145 mmol/L (ref 135–145)

## 2023-07-05 LAB — CBC
HCT: 31.8 % — ABNORMAL LOW (ref 39.0–52.0)
Hemoglobin: 10.7 g/dL — ABNORMAL LOW (ref 13.0–17.0)
MCH: 35.4 pg — ABNORMAL HIGH (ref 26.0–34.0)
MCHC: 33.6 g/dL (ref 30.0–36.0)
MCV: 105.3 fL — ABNORMAL HIGH (ref 80.0–100.0)
Platelets: 124 10*3/uL — ABNORMAL LOW (ref 150–400)
RBC: 3.02 MIL/uL — ABNORMAL LOW (ref 4.22–5.81)
RDW: 15.3 % (ref 11.5–15.5)
WBC: 7.4 10*3/uL (ref 4.0–10.5)
nRBC: 0 % (ref 0.0–0.2)

## 2023-07-05 LAB — PROTIME-INR
INR: 1 (ref 0.8–1.2)
Prothrombin Time: 12.9 s (ref 11.4–15.2)

## 2023-07-05 LAB — MAGNESIUM: Magnesium: 1.9 mg/dL (ref 1.7–2.4)

## 2023-07-05 LAB — LACTATE DEHYDROGENASE: LDH: 157 U/L (ref 98–192)

## 2023-07-05 MED ORDER — MAGNESIUM SULFATE 2 GM/50ML IV SOLN
2.0000 g | Freq: Once | INTRAVENOUS | Status: AC
  Administered 2023-07-05: 2 g via INTRAVENOUS
  Filled 2023-07-05: qty 50

## 2023-07-05 NOTE — Consult Note (Addendum)
 Daily Progress Note  DOA: 07/04/2023 Hospital Day: 2   Cc: Coffee-ground emesis  Brief History:  38 y.o. year old male with a medical history including but not limited to nonischemic cardiomyopathy and prior polysubstance abuse (cocaine and alcohol).  He is status post LVAD placement January 2021. Admitted yesterday with acute N/V and eventual coffee ground emesis. See 5/30 GI consult note.   ASSESSMENT    38 year old male with chronic systolic heart failure secondary to severe nonischemic cardiomyopathy and prior polysubstance abuse (cocaine and alcohol ), status post LVAD January 2021, admitted with acute N/V and coffee ground emesis.   Today:  Hgb stable. BUN normal. No further vomiting. Did have some dry heaves earlier but passed and sipping on Gingerale.  Chronic macrocytic anemia, stable.  Normal iron studies, B12 and folate. Today:  hgb 10.7 ( at baseline). The 12.1 was likely a reflection of volume depletion in setting of N/V. No black stools ( no BMs since admission)  Acute thrombocytopenia Platelets stable at 124   Principal Problem:   GI bleed Active Problems:   Nausea and vomiting in adult   PLAN   --Continue twice daily PPI --Clear liquid diet.  --Trend H/H  --No immediate plans for endoscopic evaluation   Subjective   Heaves earlier but passed. Now tolerating sips of Gingerale. No further emesis. No black stools. Feels okay   Objective   GI Studies:    Recent Labs    07/04/23 1427 07/05/23 0415  WBC 11.8* 7.4  HGB 12.1* 10.7*  HCT 36.3* 31.8*  MCV 108.0* 105.3*  PLT 149* 124*   Recent Labs    07/04/23 1835  FOLATE 6.2  VITAMINB12 290  FERRITIN 204  TIBC 300  IRONPCTSAT 33   Recent Labs    07/04/23 1427 07/05/23 0415  NA 145 145  K 4.5 3.8  CL 103 108  CO2 11* 17*  GLUCOSE 151* 88  BUN 16 15  CREATININE 2.06* 1.67*  CALCIUM 9.5 9.3   Recent Labs    07/04/23 1427  PROT 8.6*  ALBUMIN  4.9  AST 73*  ALT 34   ALKPHOS 62  BILITOT 2.1*      Imaging:  US  ABDOMEN LIMITED RUQ (LIVER/GB) CLINICAL DATA:  Elevated liver enzymes  EXAM: ULTRASOUND ABDOMEN LIMITED RIGHT UPPER QUADRANT  COMPARISON:  None Available.  FINDINGS: Gallbladder:  No gallstones or wall thickening visualized. No sonographic Murphy sign noted by sonographer.  Common bile duct:  Diameter: 2 mm  Liver:  No focal lesion identified. Within normal limits in parenchymal echogenicity. Portal vein is patent on color Doppler imaging with normal direction of blood flow towards the liver.  Other: None.  IMPRESSION: No abnormalities are identified on today's study to explain the patient's elevated liver enzymes.  Electronically Signed   By: Lorrene Rosser III M.D.   On: 10/02/2021 14:35     Scheduled inpatient medications:   pantoprazole  (PROTONIX ) IV  40 mg Intravenous Q12H   Continuous inpatient infusions:   ampicillin  (OMNIPEN) IV 2 g (07/05/23 0624)   PRN inpatient medications: acetaminophen , ondansetron  (ZOFRAN ) IV  Vital signs in last 24 hours: Temp:  [97.8 F (36.6 C)-98.8 F (37.1 C)] 98.6 F (37 C) (05/30 2310) Pulse Rate:  [78-150] 97 (05/31 0500) Resp:  [12-54] 13 (05/31 0600) BP: (85-147)/(70-111) 96/76 (05/31 0600) SpO2:  [96 %-100 %] 99 % (05/31 0500) Weight:  [48.9 kg] 48.9 kg (05/31 0500) Last BM Date :  (PTA)  Intake/Output Summary (Last  24 hours) at 07/05/2023 0827 Last data filed at 07/05/2023 0400 Gross per 24 hour  Intake 857.65 ml  Output 525 ml  Net 332.65 ml    Intake/Output from previous day: 05/30 0701 - 05/31 0700 In: 857.7 [IV Piggyback:857.7] Out: 525 [Urine:525] Intake/Output this shift: No intake/output data recorded.   Physical Exam:  General: Alert male in NAD Heart:  Regular rate and rhythm.  Pulmonary: Normal respiratory effort Abdomen: Soft, nondistended, nontender. Normal bowel sounds. Extremities: No lower extremity edema  Neurologic: Alert and  oriented Psych: Pleasant. Cooperative     LOS: 1 day   Mai Schwalbe ,NP 07/05/2023, 8:27 AM   I have taken an interval history, thoroughly reviewed the chart and examined the patient. I agree with the Advanced Practitioner's note, impression and recommendations, and have recorded additional findings, impressions and recommendations below. I performed a substantive portion of this encounter (>50% time spent), including a complete performance of the medical decision making.  My additional thoughts are as follows:   Still reports vomiting, confirmed by nursing.  No further hematemesis/coffee-ground's emesis.  He has as needed Zofran  ordered but has not been administered because he typically does not have a nausea prodrome before the vomiting. I suggested the idea of giving him Zofran  4 mg every 8 hours at least for 24 hours to settle down his vomiting in keeping from developing further hematemesis.  I looked at his EKG on file from admission to check the QTc but it has a poor quality baseline, so I do not know if the QTc of 500 is accurate.  Discussed with nursing staff, we are going to wait for the opinion of the heart failure physician today on that matter (discussion ongoing in the chat).  No current plans for upper endoscopy on this patient  Kerby Pearson III [phone]

## 2023-07-05 NOTE — H&P (Signed)
 Advanced Heart Failure VAD History and Physical Note   HF Cardiologist: Dr. Julane Ny  Reason for Admission: Upper GI bleed and driveline infection  Interval hx:   - No acute events overnight - Continues to have emesis, however, no coffee ground emesis or blood.   Objective:    Vital Signs:   Temp:  [97.8 F (36.6 C)-98.8 F (37.1 C)] 98.5 F (36.9 C) (05/31 1200) Pulse Rate:  [78-121] 86 (05/31 1400) Resp:  [10-54] 13 (05/31 1400) BP: (84-147)/(53-111) 101/81 (05/31 1400) SpO2:  [92 %-100 %] 98 % (05/31 1400) Weight:  [48.9 kg] 48.9 kg (05/31 0500) Last BM Date :  (PTA) Filed Weights   07/05/23 0500  Weight: 48.9 kg    Mean arterial Pressure 89  Physical Exam    General:  resting comfortably Neck: JVP less than 5 Cor: Mechanical heart sounds with LVAD hum present. Lungs: clear Abdomen: soft Driveline: C/D/I; securement device intact and driveline incorporated Extremities: no edema Neuro: alert & orientedx3. Affect pleasant   Telemetry   ST 90s  Labs    Basic Metabolic Panel: Recent Labs  Lab 07/04/23 1427 07/05/23 0415  NA 145 145  K 4.5 3.8  CL 103 108  CO2 11* 17*  GLUCOSE 151* 88  BUN 16 15  CREATININE 2.06* 1.67*  CALCIUM 9.5 9.3    Liver Function Tests: Recent Labs  Lab 07/04/23 1427  AST 73*  ALT 34  ALKPHOS 62  BILITOT 2.1*  PROT 8.6*  ALBUMIN  4.9   No results for input(s): "LIPASE", "AMYLASE" in the last 168 hours. No results for input(s): "AMMONIA" in the last 168 hours.  CBC: Recent Labs  Lab 07/04/23 1427 07/05/23 0415  WBC 11.8* 7.4  NEUTROABS 8.9*  --   HGB 12.1* 10.7*  HCT 36.3* 31.8*  MCV 108.0* 105.3*  PLT 149* 124*     No results for input(s): "GLUCAP" in the last 168 hours.  Coagulation Studies: Recent Labs    07/04/23 1427 07/05/23 0415  LABPROT 13.8 12.9  INR 1.0 1.0   LVAD INTERROGATION:  LVAD flow 3.9, Speed 5400, PI 6, Power 4   Imaging    No results found.    Patient Profile:    38 y.o. male with history of chronic systolic CHF/NICM s/p HM III VAD, hx driveline infection, hx polysubstance abuse.  Admitted with upper GI bleed    Assessment/Plan:    Upper GI bleed / Intractable nausea & vomiting - Recurrent episodes of hematemesis starting around 2 am this morning -Dry on exam and tachycardic. Give 1L bolus LR now.  -Hgb stable at 10.7 -GI following advancing diet to clear liquids. Continuing to have emesis.  -Continue IV protonix  -N/V possibly due to [facility]?  2. Chronic systolic HF due to severe NICM s/p VAD - Echo EF 10% with biventricular failure - HM-3 VAD implant 02/26/19 - Echo 7/21 EF 40-45% - Appears euvolemic now; will monitor I/Os. Encouraging PO intake as possible. Lactate has normalized.   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 Initial episode 3/22. Recurrent infection 2/23 requiring debridement and wound vac.  - Grew E. Coli, Proteus, and E Faecalis on cultures in 04/25. Has been on chronic suppressive cefadroxil  and was started on levaquin  following recent culture results - WBC ct down to 7.4 from 11.8. Will continue ampicillin .    4. VAD - Admit 12/21 with multiple low flow alarms. Rising LDH and decreasing PLTs. Suspected outflow graft kink occlusion. Patient placed on his left side  and resolved.  - VAD interrogated personally. Parameters stable. - DL site management as above - LDH 157, INR 1.0 (off warfarin)   5. Lactic acidosis - resolved with IVF & abx.   6. Hx substance abuse (cocaine and ETOH) - UDS + for cannibinoids and cocaine at OSH - Denies cocaine use  I reviewed the LVAD parameters from today, and compared the results to the patient's prior recorded data.  No programming changes were made.  The LVAD is functioning within specified parameters.  The patient performs LVAD self-test daily.  LVAD interrogation was negative for any significant power changes, alarms or PI events/speed drops.  LVAD equipment check completed  and is in good working order.  Back-up equipment present.   LVAD education done on emergency procedures and precautions and reviewed exit site care.  Length of Stay: 1  Raymound Katich, DO 07/05/2023, 2:13 PM    VAD Team Pager [phone] (7am - 7am) +++VAD ISSUES ONLY+++   Advanced Heart Failure Team Pager [phone] (M-F; 7a - 5p)  Please contact [facility] for night-coverage after hours (5p -7a ) and weekends on [email or web address] for all non- LVAD Issues

## 2023-07-06 ENCOUNTER — Other Ambulatory Visit: Payer: Self-pay

## 2023-07-06 ENCOUNTER — Encounter (HOSPITAL_COMMUNITY): Payer: Self-pay | Admitting: Cardiology

## 2023-07-06 DIAGNOSIS — K922 Gastrointestinal hemorrhage, unspecified: Secondary | ICD-10-CM | POA: Diagnosis not present

## 2023-07-06 DIAGNOSIS — K920 Hematemesis: Secondary | ICD-10-CM

## 2023-07-06 DIAGNOSIS — D696 Thrombocytopenia, unspecified: Secondary | ICD-10-CM | POA: Diagnosis not present

## 2023-07-06 DIAGNOSIS — R7989 Other specified abnormal findings of blood chemistry: Secondary | ICD-10-CM | POA: Diagnosis not present

## 2023-07-06 DIAGNOSIS — K5900 Constipation, unspecified: Secondary | ICD-10-CM

## 2023-07-06 DIAGNOSIS — D539 Nutritional anemia, unspecified: Secondary | ICD-10-CM | POA: Diagnosis not present

## 2023-07-06 DIAGNOSIS — R825 Elevated urine levels of drugs, medicaments and biological substances: Secondary | ICD-10-CM | POA: Diagnosis not present

## 2023-07-06 DIAGNOSIS — Z95811 Presence of heart assist device: Secondary | ICD-10-CM | POA: Diagnosis not present

## 2023-07-06 LAB — BASIC METABOLIC PANEL WITH GFR
Anion gap: 14 (ref 5–15)
BUN: 14 mg/dL (ref 6–20)
CO2: 23 mmol/L (ref 22–32)
Calcium: 9.6 mg/dL (ref 8.9–10.3)
Chloride: 105 mmol/L (ref 98–111)
Creatinine, Ser: 1.02 mg/dL (ref 0.61–1.24)
GFR, Estimated: >60 mL/min (ref >60)
Glucose, Bld: 117 mg/dL — ABNORMAL HIGH (ref 70–99)
Potassium: 3.2 mmol/L — ABNORMAL LOW (ref 3.5–5.1)
Sodium: 142 mmol/L (ref 135–145)

## 2023-07-06 LAB — PROTIME-INR
INR: 0.9 (ref 0.8–1.2)
Prothrombin Time: 12.8 s (ref 11.4–15.2)

## 2023-07-06 LAB — CBC
HCT: 31.8 % — ABNORMAL LOW (ref 39.0–52.0)
Hemoglobin: 11 g/dL — ABNORMAL LOW (ref 13.0–17.0)
MCH: 35.8 pg — ABNORMAL HIGH (ref 26.0–34.0)
MCHC: 34.6 g/dL (ref 30.0–36.0)
MCV: 103.6 fL — ABNORMAL HIGH (ref 80.0–100.0)
Platelets: 115 10*3/uL — ABNORMAL LOW (ref 150–400)
RBC: 3.07 MIL/uL — ABNORMAL LOW (ref 4.22–5.81)
RDW: 15 % (ref 11.5–15.5)
WBC: 6.5 10*3/uL (ref 4.0–10.5)
nRBC: 0 % (ref 0.0–0.2)

## 2023-07-06 LAB — LACTATE DEHYDROGENASE: LDH: 163 U/L (ref 98–192)

## 2023-07-06 MED ORDER — BISACODYL 10 MG RE SUPP
10.0000 mg | Freq: Once | RECTAL | Status: AC
  Administered 2023-07-06: 10 mg via RECTAL
  Filled 2023-07-06: qty 1

## 2023-07-06 MED ORDER — SODIUM CHLORIDE (PF) 0.9 % IJ SOLN
INTRAMUSCULAR | Status: AC
  Administered 2023-07-06: 10 mL
  Filled 2023-07-06: qty 10

## 2023-07-06 MED ORDER — METOCLOPRAMIDE HCL 5 MG/ML IJ SOLN
5.0000 mg | Freq: Three times a day (TID) | INTRAMUSCULAR | Status: DC
  Administered 2023-07-06 – 2023-07-08 (×6): 5 mg via INTRAVENOUS
  Filled 2023-07-06 (×6): qty 2

## 2023-07-06 NOTE — Plan of Care (Signed)
  Problem: Education: Goal: Knowledge of General Education information will improve Description: Including pain rating scale, medication(s)/side effects and non-pharmacologic comfort measures Outcome: Progressing   Problem: Health Behavior/Discharge Planning: Goal: Ability to manage health-related needs will improve Outcome: Progressing   Problem: Clinical Measurements: Goal: Ability to maintain clinical measurements within normal limits will improve Outcome: Progressing Goal: Will remain free from infection Outcome: Progressing Goal: Diagnostic test results will improve Outcome: Progressing Goal: Respiratory complications will improve Outcome: Progressing Goal: Cardiovascular complication will be avoided Outcome: Progressing   Problem: Activity: Goal: Risk for activity intolerance will decrease Outcome: Progressing   Problem: Nutrition: Goal: Adequate nutrition will be maintained Outcome: Progressing   Problem: Coping: Goal: Level of anxiety will decrease Outcome: Progressing   Problem: Elimination: Goal: Will not experience complications related to bowel motility Outcome: Progressing Goal: Will not experience complications related to urinary retention Outcome: Progressing   Problem: Pain Managment: Goal: General experience of comfort will improve and/or be controlled Outcome: Progressing   Problem: Safety: Goal: Ability to remain free from injury will improve Outcome: Progressing   Problem: Skin Integrity: Goal: Risk for impaired skin integrity will decrease Outcome: Progressing   Problem: Education: Goal: Patient will understand all VAD equipment and how it functions Outcome: Progressing Goal: Patient will be able to verbalize current INR target range and antiplatelet therapy for discharge home Outcome: Progressing   Problem: Cardiac: Goal: LVAD will function as expected and patient will experience no clinical alarms Outcome: Progressing

## 2023-07-06 NOTE — H&P (Signed)
 Advanced Heart Failure VAD History and Physical Note   HF Cardiologist: Dr. Julane Ny  Reason for Admission: Upper GI bleed and driveline infection  Interval hx:   - No acute events overnight - Continues to have emesis, however, no coffee ground emesis or blood.   Objective:    Vital Signs:   Temp:  [98.1 F (36.7 C)-98.5 F (36.9 C)] 98.1 F (36.7 C) (06/01 0730) Pulse Rate:  [77-107] 83 (06/01 0800) Resp:  [9-34] 10 (06/01 0800) BP: (87-109)/(60-94) 107/93 (06/01 0800) SpO2:  [90 %-100 %] 99 % (06/01 0800) Weight:  [50 kg] 50 kg (06/01 0500) Last BM Date :  (PTA) Filed Weights   07/05/23 0500 07/06/23 0500  Weight: 48.9 kg 50 kg    Mean arterial Pressure 89  Physical Exam    General:  resting comfortably Neck: JVP less than 5 Cor: normal  LVAD hum Lungs: CTA Abdomen: soft Driveline: C/D/I; securement device intact and driveline incorporated Extremities: no edema Neuro: alert & orientedx3. Affect pleasant   Telemetry   Sinus 70s  Labs    Basic Metabolic Panel: Recent Labs  Lab 07/04/23 1427 07/05/23 0415 07/05/23 1834 07/06/23 0228  NA 145 145  --  142  K 4.5 3.8  --  3.2*  CL 103 108  --  105  CO2 11* 17*  --  23  GLUCOSE 151* 88  --  117*  BUN 16 15  --  14  CREATININE 2.06* 1.67*  --  1.02  CALCIUM 9.5 9.3  --  9.6  MG  --   --  1.9  --     Liver Function Tests: Recent Labs  Lab 07/04/23 1427  AST 73*  ALT 34  ALKPHOS 62  BILITOT 2.1*  PROT 8.6*  ALBUMIN  4.9   No results for input(s): "LIPASE", "AMYLASE" in the last 168 hours. No results for input(s): "AMMONIA" in the last 168 hours.  CBC: Recent Labs  Lab 07/04/23 1427 07/05/23 0415 07/06/23 0228  WBC 11.8* 7.4 6.5  NEUTROABS 8.9*  --   --   HGB 12.1* 10.7* 11.0*  HCT 36.3* 31.8* 31.8*  MCV 108.0* 105.3* 103.6*  PLT 149* 124* 115*     No results for input(s): "GLUCAP" in the last 168 hours.  Coagulation Studies: Recent Labs    07/04/23 1427 07/05/23 0415  07/06/23 0228  LABPROT 13.8 12.9 12.8  INR 1.0 1.0 0.9   LVAD INTERROGATION:  LVAD flow 3.7, Speed 5400, PI 6.2, Power 3.9   Imaging    No results found.    Patient Profile:   38 y.o. male with history of chronic systolic CHF/NICM s/p HM III VAD, hx driveline infection, hx polysubstance abuse.  Admitted with upper GI bleed    Assessment/Plan:    Upper GI bleed / Intractable nausea & vomiting - Recurrent episodes of hematemesis starting around 2 am this morning -Dry on exam and tachycardic. Give 1L bolus LR now.  -Hgb 11 from 10.7 -Currently on clear liquids; continues to have some emesis.  - Starting reglan , discussed with pharmD.  -Continue IV protonix  -N/V possibly due to [facility]? - Transfer to floor - No BM yet; starting laxatives.   2. Chronic systolic HF due to severe NICM s/p VAD - Echo EF 10% with biventricular failure - HM-3 VAD implant 02/26/19 - Echo 7/21 EF 40-45% - Euvolemic, encouraged PO intake.   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 Initial episode 3/22. Recurrent infection 2/23 requiring debridement and wound  vac.  - Grew E. Coli, Proteus, and E Faecalis on cultures in 04/25. Has been on chronic suppressive cefadroxil  and was started on levaquin  following recent culture results - WBC ct down to 6.5 from 11.8. Will continue ampicillin .    4. VAD - Admit 12/21 with multiple low flow alarms. Rising LDH and decreasing PLTs. Suspected outflow graft kink occlusion. Patient placed on his left side and resolved.  - VAD interrogated personally. Parameters stable. - DL site management as above - LDH 157, INR 0.9 (off warfarin)   5. Lactic acidosis - resolved with IVF & abx.   6. Hx substance abuse (cocaine and ETOH) - UDS + for cannibinoids and cocaine at OSH - Denies cocaine use  I reviewed the LVAD parameters from today, and compared the results to the patient's prior recorded data.  No programming changes were made.  The LVAD is functioning  within specified parameters.  The patient performs LVAD self-test daily.  LVAD interrogation was negative for any significant power changes, alarms or PI events/speed drops.  LVAD equipment check completed and is in good working order.  Back-up equipment present.   LVAD education done on emergency procedures and precautions and reviewed exit site care.  Length of Stay: 2  Alwin Baars, DO 07/06/2023, 10:48 AM    VAD Team Pager [phone] (7am - 7am) +++VAD ISSUES ONLY+++   Advanced Heart Failure Team Pager [phone] (M-F; 7a - 5p)  Please contact [facility] for night-coverage after hours (5p -7a ) and weekends on [email or web address] for all non- LVAD Issues

## 2023-07-06 NOTE — Progress Notes (Addendum)
 Daily Progress Note  DOA: 07/04/2023 Hospital Day: 3   Cc:  vomiting / dark emesisi  Brief History:  38 y.o. year old male with a medical history including but not limited to nonischemic cardiomyopathy and prior polysubstance abuse (cocaine and alcohol).  He is status post LVAD placement January 2021. Admitted yesterday with acute N/V and eventual coffee ground emesis. See 5/30 GI consult note.   ASSESSMENT    38 year old male with chronic systolic heart failure secondary to severe nonischemic cardiomyopathy and prior polysubstance abuse (cocaine and alcohol ), status post LVAD January 2021, admitted with acute N/V and coffee ground emesis.   Today:  Hgb stable at 11. BUN normal. Still having episodes of emesis, mainly right after PO intake ( vomited coffee this am). No coffee ground emesis. No black stools. At this point there is low likelihood of ongoing GI bleeding per cause of ongoing intermittent vomiting is not clear   Chronic macrocytic anemia, stable.  Normal iron studies, B12 and folate.  Constipation No BMs since admission   Acute thrombocytopenia Platelets stable at 115    Principal Problem:   GI bleed Active Problems:   Nausea and vomiting in adult   PLAN   --Dulcolax supp now --Reglan  5 mg IV Q 8 hours has been started. Prolonged QT is noted.  --Continue BID IV pantoprazole  --AM CBC.    Subjective   Vomiting the coffee almost immediately after he drank it this am. Also during the middle of the night he vomited what looked like some of the ice cream he ate for dinner . No abdominal pain   Objective    Recent Labs    07/04/23 1427 07/05/23 0415 07/06/23 0228  WBC 11.8* 7.4 6.5  HGB 12.1* 10.7* 11.0*  HCT 36.3* 31.8* 31.8*  MCV 108.0* 105.3* 103.6*  PLT 149* 124* 115*   Recent Labs    07/04/23 1835  FOLATE 6.2  VITAMINB12 290  FERRITIN 204  TIBC 300  IRONPCTSAT 33   Recent Labs    07/04/23 1427 07/05/23 0415 07/06/23 0228  NA  145 145 142  K 4.5 3.8 3.2*  CL 103 108 105  CO2 11* 17* 23  GLUCOSE 151* 88 117*  BUN 16 15 14   CREATININE 2.06* 1.67* 1.02  CALCIUM 9.5 9.3 9.6   Recent Labs    07/04/23 1427  PROT 8.6*  ALBUMIN  4.9  AST 73*  ALT 34  ALKPHOS 62  BILITOT 2.1*     Imaging:  US  ABDOMEN LIMITED RUQ (LIVER/GB) CLINICAL DATA:  Elevated liver enzymes  EXAM: ULTRASOUND ABDOMEN LIMITED RIGHT UPPER QUADRANT  COMPARISON:  None Available.  FINDINGS: Gallbladder:  No gallstones or wall thickening visualized. No sonographic Murphy sign noted by sonographer.  Common bile duct:  Diameter: 2 mm  Liver:  No focal lesion identified. Within normal limits in parenchymal echogenicity. Portal vein is patent on color Doppler imaging with normal direction of blood flow towards the liver.  Other: None.  IMPRESSION: No abnormalities are identified on today's study to explain the patient's elevated liver enzymes.  Electronically Signed   By: Lorrene Rosser III M.D.   On: 10/02/2021 14:35     Scheduled inpatient medications:   pantoprazole  (PROTONIX ) IV  40 mg Intravenous Q12H   Continuous inpatient infusions:   ampicillin  (OMNIPEN) IV Stopped (07/06/23 0630)   PRN inpatient medications: acetaminophen , ondansetron  (ZOFRAN ) IV  Vital signs in last 24 hours: Temp:  [98.1 F (36.7 C)-98.5 F (36.9 C)]  98.1 F (36.7 C) (06/01 0730) Pulse Rate:  [77-107] 83 (06/01 0800) Resp:  [9-34] 10 (06/01 0800) BP: (87-109)/(60-94) 107/93 (06/01 0800) SpO2:  [90 %-100 %] 99 % (06/01 0800) Weight:  [50 kg] 50 kg (06/01 0500) Last BM Date :  (PTA)  Intake/Output Summary (Last 24 hours) at 07/06/2023 0958 Last data filed at 07/06/2023 0700 Gross per 24 hour  Intake 1215.9 ml  Output 900 ml  Net 315.9 ml    Intake/Output from previous day: 05/31 0701 - 06/01 0700 In: 1215.9 [P.O.:600; IV Piggyback:615.9] Out: 900 [Urine:500; Emesis/NG output:400] Intake/Output this shift: No intake/output data  recorded.   Physical Exam:  General: Alert male in NAD Heart:  Regular rate and rhythm.  Pulmonary: Normal respiratory effort Abdomen: Soft, nondistended, nontender. Normal bowel sounds. Extremities: No lower extremity edema  Neurologic: Alert and oriented Psych: Pleasant. Cooperative     LOS: 2 days   Mai Schwalbe ,NP 07/06/2023, 9:58 AM  Still has intermittent vomiting, no further coffee-ground's emesis. Nonobstructive exam, no SBO or G00 seen on outside CTAP 07/04/2023 Still most likely acute infectious illness with 3 days of symptoms.  Also had THC and cocaine and talk screen on admission at OSH.  No current plans for endoscopy in this high risk patient since he no longer appears to have GI bleeding.  Communicated with Dr.Sabharwal by chat message regarding the issue of at least 24 hours around-the-clock antiemetics such as metoclopramide  or ondansetron .  He was agreeable as long as every 8 hour EKGs are done to monitor the QTc, and he will communicate with nursing about that.  I see that metoclopramide  5 mg IV every 8 hours was started earlier today.  Our service will follow, with Dr. Yvone Herd taking over the consult service tomorrow.

## 2023-07-06 NOTE — Progress Notes (Signed)
 Will start scheduled zofran  for persistent N/V. Q8H EKG for QTC.   Jahari Billy 4:34 PM

## 2023-07-07 DIAGNOSIS — D649 Anemia, unspecified: Secondary | ICD-10-CM | POA: Diagnosis not present

## 2023-07-07 DIAGNOSIS — Z95811 Presence of heart assist device: Secondary | ICD-10-CM | POA: Diagnosis not present

## 2023-07-07 DIAGNOSIS — K920 Hematemesis: Secondary | ICD-10-CM | POA: Diagnosis not present

## 2023-07-07 LAB — CBC
HCT: 26 % — ABNORMAL LOW (ref 39.0–52.0)
Hemoglobin: 9.2 g/dL — ABNORMAL LOW (ref 13.0–17.0)
MCH: 35.9 pg — ABNORMAL HIGH (ref 26.0–34.0)
MCHC: 35.4 g/dL (ref 30.0–36.0)
MCV: 101.6 fL — ABNORMAL HIGH (ref 80.0–100.0)
Platelets: 76 10*3/uL — ABNORMAL LOW (ref 150–400)
RBC: 2.56 MIL/uL — ABNORMAL LOW (ref 4.22–5.81)
RDW: 14.2 % (ref 11.5–15.5)
WBC: 7.6 10*3/uL (ref 4.0–10.5)
nRBC: 0 % (ref 0.0–0.2)

## 2023-07-07 LAB — BASIC METABOLIC PANEL WITH GFR
Anion gap: 12 (ref 5–15)
Anion gap: 7 (ref 5–15)
Anion gap: 9 (ref 5–15)
BUN: 8 mg/dL (ref 6–20)
BUN: 5 mg/dL — ABNORMAL LOW (ref 6–20)
BUN: 5 mg/dL — ABNORMAL LOW (ref 6–20)
CO2: 22 mmol/L (ref 22–32)
CO2: 23 mmol/L (ref 22–32)
CO2: 21 mmol/L — ABNORMAL LOW (ref 22–32)
Calcium: 8.8 mg/dL — ABNORMAL LOW (ref 8.9–10.3)
Calcium: 8.3 mg/dL — ABNORMAL LOW (ref 8.9–10.3)
Calcium: 8.2 mg/dL — ABNORMAL LOW (ref 8.9–10.3)
Chloride: 100 mmol/L (ref 98–111)
Chloride: 102 mmol/L (ref 98–111)
Chloride: 101 mmol/L (ref 98–111)
Creatinine, Ser: 0.84 mg/dL (ref 0.61–1.24)
Creatinine, Ser: 0.76 mg/dL (ref 0.61–1.24)
Creatinine, Ser: 0.9 mg/dL (ref 0.61–1.24)
GFR, Estimated: >60 mL/min (ref >60)
GFR, Estimated: >60 mL/min (ref >60)
GFR, Estimated: >60 mL/min (ref >60)
Glucose, Bld: 119 mg/dL — ABNORMAL HIGH (ref 70–99)
Glucose, Bld: 119 mg/dL — ABNORMAL HIGH (ref 70–99)
Glucose, Bld: 128 mg/dL — ABNORMAL HIGH (ref 70–99)
Potassium: 2.7 mmol/L — CL (ref 3.5–5.1)
Potassium: 2.7 mmol/L — CL (ref 3.5–5.1)
Potassium: 3.2 mmol/L — ABNORMAL LOW (ref 3.5–5.1)
Sodium: 134 mmol/L — ABNORMAL LOW (ref 135–145)
Sodium: 132 mmol/L — ABNORMAL LOW (ref 135–145)
Sodium: 131 mmol/L — ABNORMAL LOW (ref 135–145)

## 2023-07-07 LAB — AEROBIC CULTURE W GRAM STAIN (SUPERFICIAL SPECIMEN)
Culture: NO GROWTH — AB
Gram Stain: NONE SEEN

## 2023-07-07 LAB — MAGNESIUM: Magnesium: 1.5 mg/dL — ABNORMAL LOW (ref 1.7–2.4)

## 2023-07-07 LAB — LACTATE DEHYDROGENASE: LDH: 155 U/L (ref 98–192)

## 2023-07-07 MED ORDER — SODIUM CHLORIDE (PF) 0.9 % IJ SOLN
INTRAMUSCULAR | Status: AC
  Administered 2023-07-07: 10 mL
  Filled 2023-07-07: qty 10

## 2023-07-07 MED ORDER — POTASSIUM CHLORIDE 10 MEQ/100ML IV SOLN
10.0000 meq | INTRAVENOUS | Status: AC
  Administered 2023-07-07 (×2): 10 meq via INTRAVENOUS
  Filled 2023-07-07 (×2): qty 100

## 2023-07-07 MED ORDER — SODIUM CHLORIDE 0.9 % IV SOLN
3.0000 g | Freq: Four times a day (QID) | INTRAVENOUS | Status: DC
  Administered 2023-07-07 – 2023-07-09 (×8): 3 g via INTRAVENOUS
  Filled 2023-07-07 (×8): qty 8

## 2023-07-07 MED ORDER — POTASSIUM CHLORIDE 10 MEQ/100ML IV SOLN
10.0000 meq | INTRAVENOUS | Status: AC
  Administered 2023-07-07 (×6): 10 meq via INTRAVENOUS
  Filled 2023-07-07 (×6): qty 100

## 2023-07-07 MED ORDER — POTASSIUM CHLORIDE CRYS ER 20 MEQ PO TBCR
40.0000 meq | EXTENDED_RELEASE_TABLET | Freq: Once | ORAL | Status: AC
  Administered 2023-07-07: 40 meq via ORAL
  Filled 2023-07-07: qty 2

## 2023-07-07 MED ORDER — MAGNESIUM SULFATE 4 GM/100ML IV SOLN
4.0000 g | Freq: Once | INTRAVENOUS | Status: AC
  Administered 2023-07-07: 4 g via INTRAVENOUS
  Filled 2023-07-07: qty 100

## 2023-07-07 NOTE — Progress Notes (Signed)
 Patient ID: Jon Martin, male   DOB: 07-16-1985, 38 y.o.   MRN: [id number]    Progress Note   Subjective   Day # 3 CC; nausea vomiting with dark emesis concern for possible GI bleeding in setting of severe CHF/nonischemic cardiomyopathy status post LVAD, and history of polysubstance abuse, and recent driveline infection  IV Reglan  IV PPI Ampicillin    Hemoglobin10.7>11>9.2 Sodium 134/potassium 2.7/BUN 8/creatinine 0.84 Magnesium  1.5  Patient says he feels better, no complaints of abdominal pain, no heartburn or indigestion no dysphagia, nausea and vomiting have resolved and he is hungry.  Asking for solid food Patient reports normal brown appearing bowel movements this a.m.    Objective   Vital signs in last 24 hours: Temp:  [98.1 F (36.7 C)-99.2 F (37.3 C)] 99 F (37.2 C) (06/02 0742) Pulse Rate:  [80-112] 109 (06/02 0742) Resp:  [7-20] 20 (06/02 0742) BP: (88-110)/(75-91) 110/84 (06/02 0742) SpO2:  [92 %-100 %] 99 % (06/02 0742) Last BM Date :  (PTA) General:    Young thin African-American male in NAD Heart; LVAD hum Lungs: Respirations even and unlabored, lungs CTA bilaterally Abdomen:  Soft, nontender and nondistended. Normal bowel sounds. Extremities:  Without edema. Neurologic:  Alert and oriented,  grossly normal neurologically. Psych:  Cooperative. Normal mood and affect.  Intake/Output from previous day: 06/01 0701 - 06/02 0700 In: 1063.4 [P.O.:600; IV Piggyback:463.4] Out: 400 [Urine:200; Emesis/NG output:200] Intake/Output this shift: No intake/output data recorded.  Lab Results: Recent Labs    07/04/23 1427 07/05/23 0415 07/06/23 0228  WBC 11.8* 7.4 6.5  HGB 12.1* 10.7* 11.0*  HCT 36.3* 31.8* 31.8*  PLT 149* 124* 115*   BMET Recent Labs    07/05/23 0415 07/06/23 0228 07/07/23 0207  NA 145 142 134*  K 3.8 3.2* 2.7*  CL 108 105 100  CO2 17* 23 22  GLUCOSE 88 117* 119*  BUN 15 14 8   CREATININE 1.67* 1.02 0.84  CALCIUM 9.3 9.6 8.8*    LFT Recent Labs    07/04/23 1427  PROT 8.6*  ALBUMIN  4.9  AST 73*  ALT 34  ALKPHOS 62  BILITOT 2.1*   PT/INR Recent Labs    07/05/23 0415 07/06/23 0228  LABPROT 12.9 12.8  INR 1.0 0.9        Assessment / Plan:    #65 38 year old male seen for vomiting and dark emesis in setting of severe nonischemic cardiomyopathy, status post LVAD 2021, and with history of polysubstance abuse.  Patient has not had any evidence of active emesis or coffee-ground emesis since yesterday.  Vomiting yesterday without evidence of coffee-ground material Reports improvement in his symptoms with no residual nausea vomiting, no abdominal pain, no heartburn or indigestion, hungry and with brown stool this a.m.  Hemoglobin has drifted question if result today is spurious  Plan; allow heart healthy diet Continue to trend hemoglobin-if continues to have persistent drift, can reevaluate indication for possible EGD Continue PPI        Principal Problem:   GI bleed Active Problems:   Nausea and vomiting in adult   Hematemesis with nausea     LOS: 3 days   Wilburn Keir PA-C 07/07/2023, 9:53 AM

## 2023-07-07 NOTE — Progress Notes (Signed)
 Dr. Annitta Kindler paged with critical potassium.   Latest Reference Range & Units 07/07/23 02:07  Sodium 135 - 145 mmol/L 134 (L)  Potassium 3.5 - 5.1 mmol/L 2.7 (LL)  Chloride 98 - 111 mmol/L 100  CO2 22 - 32 mmol/L 22  Glucose 70 - 99 mg/dL 657 (H)  BUN 6 - 20 mg/dL 8  Creatinine 8.46 - 9.62 mg/dL 9.52  Calcium 8.9 - 84.1 mg/dL 8.8 (L)  Anion gap 5 - 15  12  (LL): Data is critically low (L): Data is abnormally low (H): Data is abnormally high  Plt 86, lab to redraw.

## 2023-07-07 NOTE — Progress Notes (Signed)
   07/07/23 0338  Assess: MEWS Score  Temp 99.2 F (37.3 C)  BP 93/78  MAP (mmHg) 85  Pulse Rate (!) 106  ECG Heart Rate (!) 109  Resp 14  Level of Consciousness Alert  SpO2 98 %  O2 Device Room Air  Assess: MEWS Score  MEWS Temp 0  MEWS Systolic 1  MEWS Pulse 1  MEWS RR 0  MEWS LOC 0  MEWS Score 2  MEWS Score Color Yellow  Assess: if the MEWS score is Yellow or Red  Were vital signs accurate and taken at a resting state? Yes  Does the patient meet 2 or more of the SIRS criteria? No  MEWS guidelines implemented  Yes, yellow  Treat  MEWS Interventions Considered administering scheduled or prn medications/treatments as ordered  Take Vital Signs  Increase Vital Sign Frequency  Yellow: Q2hr x1, continue Q4hrs until patient remains green for 12hrs  Escalate  MEWS: Escalate Yellow: Discuss with charge nurse and consider notifying provider and/or RRT  Notify: Charge Nurse/RN  Name of Charge Nurse/RN Notified Cooper  Assess: SIRS CRITERIA  SIRS Temperature  0  SIRS Respirations  0  SIRS Pulse 1  SIRS WBC 0  SIRS Score Sum  1

## 2023-07-07 NOTE — Progress Notes (Addendum)
 Advanced Heart Failure VAD Team Note  PCP-Cardiologist: Dr. Julane Ny   Chief Complaint: UGIB and DL Infection   Patient Profile:    37 y.o. male with history of chronic systolic CHF/NICM s/p HM III VAD, hx driveline infection, hx polysubstance abuse.   Admitted with upper GI bleed and DL infection.    Subjective:    Improved w/ Reglan . Having a "good" morning. Denies any further coffee ground emesis. N/v has resolved. No abdominal pain. Tolerating clear liquid diet ok.   Denies dyspnea. No fever or chills.   Hgb this admit 12.1>>10.7>>11.0>>pending    K 2.7, Mg 1.5. Getting IV KCl and IV Mg   Remains on ampicillin  for DL infection. Wound Cx growing normal skin flora (no group A strep nor staph aureus isolated)  LVAD INTERROGATION:  HeartMate III LVAD:   Flow 3.7 liters/min, speed 5400, power 3.9, PI 5.0.  No PI events   Objective:    Vital Signs:   Temp:  [98.1 F (36.7 C)-99.2 F (37.3 C)] 99.2 F (37.3 C) (06/02 0338) Pulse Rate:  [80-112] 106 (06/02 0338) Resp:  [7-20] 14 (06/02 0338) BP: (88-108)/(74-93) 93/78 (06/02 0338) SpO2:  [92 %-100 %] 98 % (06/02 0338) Last BM Date :  (PTA) Mean arterial Pressure 94  Intake/Output:   Intake/Output Summary (Last 24 hours) at 07/07/2023 0734 Last data filed at 07/07/2023 0641 Gross per 24 hour  Intake 1063.36 ml  Output 400 ml  Net 663.36 ml     Physical Exam    General:  Well appearing. No resp difficulty HEENT: normal Neck: supple. JVP not elevated . Carotids 2+ bilat; no bruits. No lymphadenopathy or thyromegaly appreciated. Cor: Mechanical heart sounds with LVAD hum present. Lungs: clear Abdomen: soft, nontender, nondistended. No hepatosplenomegaly. No bruits or masses. Good bowel sounds. Driveline: C/D/I; securement device intact and driveline incorporated Extremities: no cyanosis, clubbing, rash, edema Neuro: alert & orientedx3, cranial nerves grossly intact. moves all 4 extremities w/o difficulty. Affect  pleasant   Telemetry   Sinus tach, low 100s, personally reviewed   EKG    Regular R-R intervals, 104 bpm. Unable to discern p waves given motion artifact from vad, computer reading QTc at 497 ms, personally reviewed   Labs   Basic Metabolic Panel: Recent Labs  Lab 07/04/23 1427 07/05/23 0415 07/05/23 1834 07/06/23 0228 07/07/23 0207  NA 145 145  --  142 134*  K 4.5 3.8  --  3.2* 2.7*  CL 103 108  --  105 100  CO2 11* 17*  --  23 22  GLUCOSE 151* 88  --  117* 119*  BUN 16 15  --  14 8  CREATININE 2.06* 1.67*  --  1.02 0.84  CALCIUM 9.5 9.3  --  9.6 8.8*  MG  --   --  1.9  --   --     Liver Function Tests: Recent Labs  Lab 07/04/23 1427  AST 73*  ALT 34  ALKPHOS 62  BILITOT 2.1*  PROT 8.6*  ALBUMIN  4.9   No results for input(s): "LIPASE", "AMYLASE" in the last 168 hours. No results for input(s): "AMMONIA" in the last 168 hours.  CBC: Recent Labs  Lab 07/04/23 1427 07/05/23 0415 07/06/23 0228  WBC 11.8* 7.4 6.5  NEUTROABS 8.9*  --   --   HGB 12.1* 10.7* 11.0*  HCT 36.3* 31.8* 31.8*  MCV 108.0* 105.3* 103.6*  PLT 149* 124* 115*    INR: Recent Labs  Lab 07/04/23 1427 07/05/23 0415  07/06/23 0228  INR 1.0 1.0 0.9    Other results: EKG:    Imaging   No results found.   Medications:     Scheduled Medications:  metoCLOPramide  (REGLAN ) injection  5 mg Intravenous Q8H   pantoprazole  (PROTONIX ) IV  40 mg Intravenous Q12H    Infusions:  ampicillin  (OMNIPEN) IV Stopped (07/07/23 0543)   potassium chloride  10 mEq (07/07/23 0727)    PRN Medications: acetaminophen , ondansetron  (ZOFRAN ) IV   Assessment/Plan:     1. Upper GI bleed / Intractable nausea & vomiting - Improved w/ Reglan   - Denies any further coffee ground emesis. N/V now resolved. Tolerating clear liquid diet  - Hgb this admit 12.1>>10.7>>11.0>>pending (early AM CBC clotted, repeat ordered) - Continue Reglan  and IV Protonix  per GI. QTc 497 ms on EKG (monitor)  - if f/u  CBC shows stable hgb, doubt he will need GI scope  - appreciate GI team assistance   2. Chronic systolic HF due to severe NICM s/p VAD - Echo EF 10% with biventricular failure - HM-3 VAD implant 02/26/19 - Echo 7/21 EF 40-45% - Euvolemic, encouraged PO intake.  - LDH stable - of note, not on warfarin due to labile INRs  - monitor MAP. May need low dose losartan  if remains elevated   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 Initial episode 3/22. Recurrent infection 2/23 requiring debridement and wound vac.  - Grew E. Coli, Proteus, and E Faecalis on cultures in 04/25. Has been on chronic suppressive cefadroxil  and was started on levaquin  following recent culture results - WBC ct down to 6.5 from 11.8. Will continue ampicillin .  - Wound Cx from this admit growing normal skin flora (no group A strep nor staph aureus isolated)   4. VAD - Admit 12/21 with multiple low flow alarms. Rising LDH and decreasing PLTs. Suspected outflow graft kink occlusion. Patient placed on his left side and resolved.  - VAD interrogated personally. Parameters stable. - DL site management as above - LDH 155, INR 0.9 (off warfarin)   5. Lactic acidosis - resolved with IVF & abx.    6. Hx substance abuse (cocaine and ETOH) - UDS + for cannibinoids and cocaine at OSH - Denies cocaine use  7. Hypokalemia/Hypomagnesemia - K 2.7, Mg 1.5 - Aggressive K and Mg supp ordered - Monitor closely for QT prolongation, in setting of Reglan  + IV Protonix  - QTc on today's EKG 497 ms. Keep on tele     I reviewed the LVAD parameters from today, and compared the results to the patient's prior recorded data.  No programming changes were made.  The LVAD is functioning within specified parameters.  The patient performs LVAD self-test daily.  LVAD interrogation was negative for any significant power changes, alarms or PI events/speed drops.  LVAD equipment check completed and is in good working order.  Back-up equipment  present.   LVAD education done on emergency procedures and precautions and reviewed exit site care.  Length of Stay: [id number] Elissa Guise 07/07/2023, 7:34 AM  VAD Team --- VAD ISSUES ONLY--- Pager [phone] (7am - 7am)  Advanced Heart Failure Team  Pager [phone] (M-F; 7a - 5p)  Please contact [facility] for night-coverage after hours (5p -7a ) and weekends on [email or web address]  Patient seen with PA, I formulated the plan and agree with the above note.   Hgb 9.2, mildly lower.  No overt bleeding.   He continues on ampicillin  for driveline infection.   General: Well appearing  this am. NAD.  HEENT: Normal. Neck: Supple, JVP 7-8 cm. Carotids OK.  Cardiac:  Mechanical heart sounds with LVAD hum present.  Lungs:  CTAB, normal effort.  Abdomen:  NT, ND, no HSM. No bruits or masses. +BS  LVAD exit site: Well-healed and incorporated. Dressing dry and intact. No erythema or drainage. Stabilization device present and accurately applied. Driveline dressing changed daily per sterile technique. Extremities:  Warm and dry. No cyanosis, clubbing, rash, or edema.  Neuro:  Alert & oriented x 3. Cranial nerves grossly intact. Moves all 4 extremities w/o difficulty. Affect pleasant    GI following, continue to watch CBC and no plan yet for EGD.  Of note, patient has not been anticoagulated at home.   MAP generally 80s, no antihypertensive for now.   Currently on ampicillin  for driveline infection.  Will involve ID service to help us  with long-term plan.   LVAD interrogated, parameters stable.   Peder Bourdon 07/07/2023 1:39 PM

## 2023-07-07 NOTE — Plan of Care (Signed)
  Problem: Education: Goal: Knowledge of General Education information will improve Description: Including pain rating scale, medication(s)/side effects and non-pharmacologic comfort measures Outcome: Progressing   Problem: Health Behavior/Discharge Planning: Goal: Ability to manage health-related needs will improve Outcome: Progressing   Problem: Clinical Measurements: Goal: Ability to maintain clinical measurements within normal limits will improve Outcome: Progressing Goal: Will remain free from infection Outcome: Progressing Goal: Diagnostic test results will improve Outcome: Progressing Goal: Respiratory complications will improve Outcome: Progressing Goal: Cardiovascular complication will be avoided Outcome: Progressing   Problem: Activity: Goal: Risk for activity intolerance will decrease Outcome: Progressing   Problem: Nutrition: Goal: Adequate nutrition will be maintained Outcome: Progressing   Problem: Coping: Goal: Level of anxiety will decrease Outcome: Progressing   Problem: Elimination: Goal: Will not experience complications related to bowel motility Outcome: Progressing Goal: Will not experience complications related to urinary retention Outcome: Progressing   Problem: Pain Managment: Goal: General experience of comfort will improve and/or be controlled Outcome: Progressing   Problem: Safety: Goal: Ability to remain free from injury will improve Outcome: Progressing   Problem: Skin Integrity: Goal: Risk for impaired skin integrity will decrease Outcome: Progressing   Problem: Education: Goal: Patient will understand all VAD equipment and how it functions Outcome: Progressing Goal: Patient will be able to verbalize current INR target range and antiplatelet therapy for discharge home Outcome: Progressing   Problem: Cardiac: Goal: LVAD will function as expected and patient will experience no clinical alarms Outcome: Progressing

## 2023-07-07 NOTE — Progress Notes (Signed)
 LVAD Coordinator Rounding Note:  Admitted 02/26/21 to Heart Failure service for possible GIB.   HM III LVAD implanted on 02/26/19 by Dr. Jeb Miner under Destination Therapy criteria.  Patient presented to ER at [facility] in [place] with vomiting - coffee ground like and diarrhea. Pt was + for THC and cocaine.  Pt laying in bed on my arrival. Denies complaints. States he is feeling better this morning.   Currently on Ampicillin .   Vital signs: Temp: 99.0 HR: 109 Doppler Pressure: 80 Automatic BP: 110/84 (94) O2 Sat: 100% RA Wt: 110.2 lbs  LVAD interrogation reveals:  Speed: 5400 Flow: 3.8 Power: 4.0w PI:  5.3 Hct: 20 - DO NOT CHANGE   Alarms: none Events: none today; 60+ yesterday  Fixed speed: 5400 Low speed limit: 5100  Drive Line:  Existing VAD dressing removed and site care performed using sterile technique. Drive line exit site cleaned with Chlora prep applicators x 2, allowed to dry, and gauze dressing applied. Exit site healed and incorporated, the velour is exposed significantly at exit site. No redness,  tenderness, small amount of brown drainage with foul odor and no rash noted. Drive line anchor re-applied. We will change here in hospital MWF. Next dressing due 07/09/23 by bedside RN.       Labs:  LDH trend: 240 155  Lactic acid: 2.8>5.6>1.6  Hgb trend: 12.1>10.7>11.0>9.2  Anticoagulation Plan: -INR Goal: not on coumadin  -ASA Dose: none  Blood Products:   Device: N/A  Infection:  07/04/23 Drive line wound culture>>normal skin flora 07/04/23 Blood cultures >>no growth x 3 days; final pending  Drips:    Plan/Recommendations:  1. Call VAD Coordinator if any VAD equipment or drive line issues. 2. Driveline dressing changes MWF per bedside nurse. Next dressing change due 07/07/23.  Adams Adams RN VAD Coordinator  Office: [phone]  24/7 Pager: [phone]

## 2023-07-07 NOTE — Plan of Care (Signed)
  Problem: Education: Goal: Knowledge of General Education information will improve Description: Including pain rating scale, medication(s)/side effects and non-pharmacologic comfort measures Outcome: Progressing   Problem: Clinical Measurements: Goal: Respiratory complications will improve Outcome: Progressing Goal: Cardiovascular complication will be avoided Outcome: Progressing   Problem: Activity: Goal: Risk for activity intolerance will decrease Outcome: Progressing   Problem: Nutrition: Goal: Adequate nutrition will be maintained Outcome: Progressing   Problem: Coping: Goal: Level of anxiety will decrease Outcome: Progressing   Problem: Elimination: Goal: Will not experience complications related to urinary retention Outcome: Progressing   Problem: Pain Managment: Goal: General experience of comfort will improve and/or be controlled Outcome: Progressing   Problem: Cardiac: Goal: LVAD will function as expected and patient will experience no clinical alarms Outcome: Progressing

## 2023-07-08 ENCOUNTER — Other Ambulatory Visit (HOSPITAL_COMMUNITY): Payer: Self-pay

## 2023-07-08 DIAGNOSIS — E876 Hypokalemia: Secondary | ICD-10-CM | POA: Diagnosis not present

## 2023-07-08 DIAGNOSIS — T827XXA Infection and inflammatory reaction due to other cardiac and vascular devices, implants and grafts, initial encounter: Secondary | ICD-10-CM

## 2023-07-08 DIAGNOSIS — Z95811 Presence of heart assist device: Secondary | ICD-10-CM | POA: Diagnosis not present

## 2023-07-08 DIAGNOSIS — K920 Hematemesis: Secondary | ICD-10-CM | POA: Diagnosis not present

## 2023-07-08 DIAGNOSIS — D649 Anemia, unspecified: Secondary | ICD-10-CM | POA: Diagnosis not present

## 2023-07-08 DIAGNOSIS — D696 Thrombocytopenia, unspecified: Secondary | ICD-10-CM | POA: Diagnosis not present

## 2023-07-08 LAB — BASIC METABOLIC PANEL WITH GFR
Anion gap: 9 (ref 5–15)
Anion gap: 9 (ref 5–15)
BUN: 6 mg/dL (ref 6–20)
BUN: 8 mg/dL (ref 6–20)
CO2: 22 mmol/L (ref 22–32)
CO2: 21 mmol/L — ABNORMAL LOW (ref 22–32)
Calcium: 8.4 mg/dL — ABNORMAL LOW (ref 8.9–10.3)
Calcium: 9 mg/dL (ref 8.9–10.3)
Chloride: 102 mmol/L (ref 98–111)
Chloride: 104 mmol/L (ref 98–111)
Creatinine, Ser: 0.78 mg/dL (ref 0.61–1.24)
Creatinine, Ser: 1.08 mg/dL (ref 0.61–1.24)
GFR, Estimated: >60 mL/min (ref >60)
GFR, Estimated: >60 mL/min (ref >60)
Glucose, Bld: 127 mg/dL — ABNORMAL HIGH (ref 70–99)
Glucose, Bld: 143 mg/dL — ABNORMAL HIGH (ref 70–99)
Potassium: 3.1 mmol/L — ABNORMAL LOW (ref 3.5–5.1)
Potassium: 3.7 mmol/L (ref 3.5–5.1)
Sodium: 133 mmol/L — ABNORMAL LOW (ref 135–145)
Sodium: 134 mmol/L — ABNORMAL LOW (ref 135–145)

## 2023-07-08 LAB — BPAM RBC
Blood Product Expiration Date: 202506252359
Blood Product Expiration Date: 202506282359
Unit Type and Rh: 5100
Unit Type and Rh: 5100

## 2023-07-08 LAB — MAGNESIUM: Magnesium: 2 mg/dL (ref 1.7–2.4)

## 2023-07-08 LAB — BILIRUBIN, FRACTIONATED(TOT/DIR/INDIR)
Bilirubin, Direct: 0.2 mg/dL (ref 0.0–0.2)
Indirect Bilirubin: 0.3 mg/dL (ref 0.3–0.9)
Total Bilirubin: 0.5 mg/dL (ref 0.0–1.2)

## 2023-07-08 LAB — TYPE AND SCREEN
ABO/RH(D): O POS
Antibody Screen: NEGATIVE
Unit division: 0
Unit division: 0

## 2023-07-08 LAB — CBC
HCT: 24.5 % — ABNORMAL LOW (ref 39.0–52.0)
Hemoglobin: 8.6 g/dL — ABNORMAL LOW (ref 13.0–17.0)
MCH: 35.7 pg — ABNORMAL HIGH (ref 26.0–34.0)
MCHC: 35.1 g/dL (ref 30.0–36.0)
MCV: 101.7 fL — ABNORMAL HIGH (ref 80.0–100.0)
Platelets: 64 10*3/uL — ABNORMAL LOW (ref 150–400)
RBC: 2.41 MIL/uL — ABNORMAL LOW (ref 4.22–5.81)
RDW: 13.8 % (ref 11.5–15.5)
WBC: 8.2 10*3/uL (ref 4.0–10.5)
nRBC: 0 % (ref 0.0–0.2)

## 2023-07-08 LAB — LACTATE DEHYDROGENASE: LDH: 216 U/L — ABNORMAL HIGH (ref 98–192)

## 2023-07-08 MED ORDER — POTASSIUM CHLORIDE 10 MEQ/100ML IV SOLN
10.0000 meq | INTRAVENOUS | Status: AC
  Administered 2023-07-08 (×6): 10 meq via INTRAVENOUS
  Filled 2023-07-08 (×6): qty 100

## 2023-07-08 MED ORDER — SODIUM CHLORIDE (PF) 0.9 % IJ SOLN
INTRAMUSCULAR | Status: AC
  Administered 2023-07-08: 10 mL
  Filled 2023-07-08: qty 10

## 2023-07-08 MED ORDER — POTASSIUM CHLORIDE CRYS ER 20 MEQ PO TBCR
20.0000 meq | EXTENDED_RELEASE_TABLET | Freq: Once | ORAL | Status: AC
  Administered 2023-07-08: 20 meq via ORAL
  Filled 2023-07-08: qty 1

## 2023-07-08 NOTE — Progress Notes (Signed)
 LVAD Coordinator Rounding Note:  Admitted 02/26/21 to Heart Failure service for possible GIB.   HM III LVAD implanted on 02/26/19 by Dr. Jeb Miner under Destination Therapy criteria.  Patient presented to ER at [facility] in [place] with vomiting - coffee ground like and diarrhea. Pt was + for THC and cocaine.  Pt laying in bed on my arrival. Denies complaints. States he is feeling better this morning.   Switched to Unasyn yesterday.   K 3.1- getting 6 runs of K today.   Vital signs: Temp: 99.0 HR: 122 Doppler Pressure: 80 Automatic BP: 93/75 (83) O2 Sat: 100% RA Wt: 110.2>113.5 lbs  LVAD interrogation reveals:  Speed: 5400 Flow: 3.7 Power: 3.8w PI: 5.2 Hct: 20 - DO NOT CHANGE   Alarms: none Events: 5 PI today  Fixed speed: 5400 Low speed limit: 5100  Drive Line:  Existing VAD dressing clean, dry, and intact. Drive line anchor correctly applied. We will change here in hospital MWF. Next dressing due 07/09/23 by bedside RN.    Labs:  LDH trend: 240 155>216  Lactic acid: 2.8>5.6>1.6  Hgb trend: 12.1>10.7>11.0>9.2>8.6  Anticoagulation Plan: -INR Goal: not on coumadin  -ASA Dose: none  Blood Products:   Device: N/A  Infection:  07/04/23 Drive line wound culture>>normal skin flora 07/04/23 Blood cultures >>no growth x 4 days; final pending  Drips:    Plan/Recommendations:  1. Call VAD Coordinator if any VAD equipment or drive line issues. 2. Driveline dressing changes MWF per bedside nurse.    Paulo Bosworth RN VAD Coordinator  Office: [phone]  24/7 Pager: [phone]

## 2023-07-08 NOTE — Plan of Care (Signed)
  Problem: Education: Goal: Knowledge of General Education information will improve Description: Including pain rating scale, medication(s)/side effects and non-pharmacologic comfort measures Outcome: Progressing   Problem: Health Behavior/Discharge Planning: Goal: Ability to manage health-related needs will improve Outcome: Progressing   Problem: Clinical Measurements: Goal: Ability to maintain clinical measurements within normal limits will improve Outcome: Progressing Goal: Respiratory complications will improve Outcome: Progressing Goal: Cardiovascular complication will be avoided Outcome: Progressing   Problem: Nutrition: Goal: Adequate nutrition will be maintained Outcome: Progressing   Problem: Coping: Goal: Level of anxiety will decrease Outcome: Progressing   Problem: Pain Managment: Goal: General experience of comfort will improve and/or be controlled Outcome: Progressing   Problem: Cardiac: Goal: LVAD will function as expected and patient will experience no clinical alarms Outcome: Progressing

## 2023-07-08 NOTE — Consult Note (Signed)
 [facility]         Reason for Consult: DLI management   Referring Physician: mclean  Principal Problem:   GI bleed Active Problems:   Nausea and vomiting in adult   Coffee ground emesis   Anemia    HPI: Jon Martin is a 38 y.o. male with LVAD and hx of MSSA DLI s/p I x D in 4/22, and recurrence with I x D in Jan 2023, where by late February 2023, he was to be on lifelong cefadroxil  1000mg  bid. IN April 2025, the patient started to have persistent drainage and tenderness about his driveline where he had wound culture in clinic - show e.coli, proteus mirabilis, and few e.faecalis. he was started on levofloxacin  in addition to cefadroxil , for which he reports taking. He is now admitted on 5/30 for coffee-ground emesis and abdominal pain, concerning for UGIB. His existing VAD dressing was removed on admission, the exit site is well healed but the velour is exposed significantly at exit site. There is no redness but small amount of brown drainage but no rash was noted. That cultures grew normal skin flora.   He was placed on ampicillin  given his e.coli and efaecalis, and proteus infection, now changed to amp/sub on 6/2 which would cover MSSA as well.  He remains afebrile, WBC is WNL. His hgb now stabilized and denies further coffee ground emesis nor abdominal pain.   ID asked to weigh in on abtx recommendations. Upon discussing how he was taking his medications, he is able to repeat what abtx he has, but overall has low health literacy  Past Medical History:  Diagnosis Date   Dilated cardiomyopathy (HCC)    Dilated cardiomyopathy (HCC) 02/2019   Polysubstance abuse (HCC)     Allergies: No Known Allergies   MEDICATIONS:  pantoprazole  (PROTONIX ) IV  40 mg Intravenous Q12H    Social History   Tobacco Use   Smoking status: Former    Types: Cigarettes   Smokeless tobacco: Never  Vaping Use   Vaping status: Some Days  Substance Use Topics   Alcohol  use: Yes   Drug use: Not Currently    Comment: Hx of cocaine use    Family History  Problem Relation Age of Onset   CAD Father     Review of Systems - 12 point ros is negative   OBJECTIVE: Temp:  [98.3 F (36.8 C)-99.5 F (37.5 C)] 99.3 F (37.4 C) (06/03 1644) Pulse Rate:  [96-118] 103 (06/03 0757) Resp:  [15-21] 21 (06/03 1644) BP: (93-113)/(67-86) 113/86 (06/03 1644) SpO2:  [96 %-100 %] 96 % (06/03 1644) Weight:  [51.5 kg] 51.5 kg (06/03 0436) Physical Exam  Constitutional: He is oriented to person, place, and time. He appears well-developed and well-nourished. No distress.  HENT:  Mouth/Throat: Oropharynx is clear and moist. No oropharyngeal exudate.  Cardiovascular: generator hum heard throughout chest wall No murmur heard.  Pulmonary/Chest: Effort normal and breath sounds normal. No respiratory distress. He has no wheezes.  Abdominal: Soft. Bowel sounds are normal. He exhibits no distension. There is no tenderness. Drive line is covered Lymphadenopathy:  He has no cervical adenopathy.  Neurological: He is alert and oriented to person, place, and time.  Skin: Skin is warm and dry. No rash noted. No erythema.  Psychiatric: He has a normal mood and affect. His behavior is normal.    LABS: Results for orders placed or performed during the hospital encounter of 07/04/23 (from the past  48 hours)  Lactate dehydrogenase     Status: None   Collection Time: 07/07/23  2:07 AM  Result Value Ref Range   LDH 155 98 - 192 U/L    Comment: Performed at [facility], [address] [place], [place] [address]  Basic metabolic panel     Status: Abnormal   Collection Time: 07/07/23  2:07 AM  Result Value Ref Range   Sodium 134 (L) 135 - 145 mmol/L    Comment: DELTA CHECK NOTED   Potassium 2.7 (LL) 3.5 - 5.1 mmol/L    Comment: CRITICAL RESULT CALLED TO, READ BACK BY AND VERIFIED WITH Darnella Elder RN [phone] M. ALAMANO   Chloride 100 98 - 111 mmol/L   CO2 22 22 - 32  mmol/L   Glucose, Bld 119 (H) 70 - 99 mg/dL    Comment: Glucose reference range applies only to samples taken after fasting for at least 8 hours.   BUN 8 6 - 20 mg/dL   Creatinine, Ser 6.21 0.61 - 1.24 mg/dL   Calcium 8.8 (L) 8.9 - 10.3 mg/dL   GFR, Estimated >30 >86 mL/min    Comment: (NOTE) Calculated using the CKD-EPI Creatinine Equation (2021)    Anion gap 12 5 - 15    Comment: Performed at [facility], [address] [place], [place] [address]  Magnesium      Status: Abnormal   Collection Time: 07/07/23  2:07 AM  Result Value Ref Range   Magnesium  1.5 (L) 1.7 - 2.4 mg/dL    Comment: Performed at [facility], [address] [place], [place] [address]  CBC     Status: Abnormal   Collection Time: 07/07/23  8:26 AM  Result Value Ref Range   WBC 7.6 4.0 - 10.5 K/uL   RBC 2.56 (L) 4.22 - 5.81 MIL/uL   Hemoglobin 9.2 (L) 13.0 - 17.0 g/dL   HCT 28.4 (L) 13.2 - 44.0 %   MCV 101.6 (H) 80.0 - 100.0 fL   MCH 35.9 (H) 26.0 - 34.0 pg   MCHC 35.4 30.0 - 36.0 g/dL   RDW 10.2 72.5 - 36.6 %   Platelets 76 (L) 150 - 400 K/uL    Comment: Immature Platelet Fraction may be clinically indicated, consider ordering this additional test [id number] REPEATED TO VERIFY    nRBC 0.0 0.0 - 0.2 %    Comment: Performed at [facility], [address] [place], [place] [address]  Basic metabolic panel     Status: Abnormal   Collection Time: 07/07/23 11:27 AM  Result Value Ref Range   Sodium 132 (L) 135 - 145 mmol/L   Potassium 2.7 (LL) 3.5 - 5.1 mmol/L    Comment: CRITICAL RESULT CALLED TO, READ BACK BY AND VERIFIED WITH C. MCKINNEY, RN AT 1244 06.02.25 D. BLU   Chloride 102 98 - 111 mmol/L   CO2 23 22 - 32 mmol/L   Glucose, Bld 119 (H) 70 - 99 mg/dL    Comment: Glucose reference range applies only to samples taken after fasting for at least 8 hours.   BUN 5 (L) 6 - 20 mg/dL   Creatinine, Ser 8.75 0.61 - 1.24 mg/dL   Calcium 8.3 (L) 8.9 - 10.3 mg/dL   GFR, Estimated >64 >33  mL/min    Comment: (NOTE) Calculated using the CKD-EPI Creatinine Equation (2021)    Anion gap 7 5 - 15    Comment: Performed at [facility],  [address] [place], [place] [address]  Basic metabolic panel     Status: Abnormal   Collection Time: 07/07/23  9:36 PM  Result Value Ref Range   Sodium 131 (L) 135 - 145 mmol/L   Potassium 3.2 (L) 3.5 - 5.1 mmol/L   Chloride 101 98 - 111 mmol/L   CO2 21 (L) 22 - 32 mmol/L   Glucose, Bld 128 (H) 70 - 99 mg/dL    Comment: Glucose reference range applies only to samples taken after fasting for at least 8 hours.   BUN 5 (L) 6 - 20 mg/dL   Creatinine, Ser 1.91 0.61 - 1.24 mg/dL   Calcium 8.2 (L) 8.9 - 10.3 mg/dL   GFR, Estimated >47 >82 mL/min    Comment: (NOTE) Calculated using the CKD-EPI Creatinine Equation (2021)    Anion gap 9 5 - 15    Comment: Performed at [facility], [address] [place], [place] [address]  Lactate dehydrogenase     Status: Abnormal   Collection Time: 07/08/23  2:15 AM  Result Value Ref Range   LDH 216 (H) 98 - 192 U/L    Comment: Performed at [facility], [address], [place], [place] [address]  CBC     Status: Abnormal   Collection Time: 07/08/23  2:15 AM  Result Value Ref Range   WBC 8.2 4.0 - 10.5 K/uL   RBC 2.41 (L) 4.22 - 5.81 MIL/uL   Hemoglobin 8.6 (L) 13.0 - 17.0 g/dL   HCT 78.4 (L) 69.6 - 29.5 %   MCV 101.7 (H) 80.0 - 100.0 fL   MCH 35.7 (H) 26.0 - 34.0 pg   MCHC 35.1 30.0 - 36.0 g/dL   RDW 28.4 13.2 - 44.0 %   Platelets 64 (L) 150 - 400 K/uL    Comment: Immature Platelet Fraction may be clinically indicated, consider ordering this additional test [id number] PLATELET COUNT CONFIRMED BY SMEAR    nRBC 0.0 0.0 - 0.2 %    Comment: Performed at [facility], [address], [place], [place] [address]  Basic metabolic panel     Status: Abnormal   Collection Time: 07/08/23  2:15 AM  Result Value Ref Range   Sodium 133 (L) 135 - 145 mmol/L   Potassium 3.1 (L) 3.5 -  5.1 mmol/L   Chloride 102 98 - 111 mmol/L   CO2 22 22 - 32 mmol/L   Glucose, Bld 127 (H) 70 - 99 mg/dL    Comment: Glucose reference range applies only to samples taken after fasting for at least 8 hours.   BUN 6 6 - 20 mg/dL   Creatinine, Ser 4.74 0.61 - 1.24 mg/dL   Calcium 8.4 (L) 8.9 - 10.3 mg/dL   GFR, Estimated >25 >95 mL/min    Comment: (NOTE) Calculated using the CKD-EPI Creatinine Equation (2021)    Anion gap 9 5 - 15    Comment: Performed at [facility], [address], [place], [place] [address]  Magnesium      Status: None   Collection Time: 07/08/23  2:15 AM  Result Value Ref Range   Magnesium  2.0 1.7 - 2.4 mg/dL    Comment: Performed at [facility], [address], [place], [place] [address]    MICRO: Reviewed 5/30:   Abnormal  MULTIPLE ORGANISMS PRESENT, NONE PREDOMINANT NO GROUP A STREP (S.PYOGENES) ISOLATED NO STAPHYLOCOCCUS AUREUS ISOLATED CONSISTENT WITH NORMAL SKIN FLORA   IMAGING: No results  found.  HISTORICAL MICRO/IMAGING4/10/25 Escherichia coli Proteus mirabilis Enterococcus faecalis      MIC MIC MIC    AMPICILLIN  4 SENSITIVE Sensitive <=2 SENSITIVE Sensitive <=2 SENSITIVE Sensitive    AMPICILLIN /SULBACTAM <=2 SENSITIVE Sensitive <=2 SENSITIVE Sensitive      CEFEPIME  <=0.12 SENS... Sensitive 0.5 SENSITIVE Sensitive      CEFTAZIDIME  <=1 SENSITIVE Sensitive <=1 SENSITIVE Sensitive      CEFTRIAXONE  <=0.25 SENS... Sensitive <=0.25 SENS... Sensitive      CIPROFLOXACIN  <=0.25 SENS... Sensitive <=0.25 SENS... Sensitive      GENTAMICIN <=1 SENSITIVE Sensitive <=1 SENSITIVE Sensitive      GENTAMICIN SYNERGY     SENSITIVE Sensitive    IMIPENEM <=0.25 SENS... Sensitive 1 SENSITIVE Sensitive      PIP/TAZO <=4 SENSITI... Sensitive <=4 SENSITI... Sensitive      TRIMETH/SULFA <=20 SENSIT... Sensitive <=20 SENSIT... Sensitive      VANCOMYCIN      <=0.5 SENSI... Sensitive    Assessment/Plan:  38yo M with LVAD. Hx of chronic MSSA DLI needing life  long cefadroxil , but 6 wk ago was treated for polymicrobial DL exit wound infection --appears healing  For the time being, continue on amp/sub while hospitalized. Once ready for discharge, please have patient resume cefadroxil  500mg  po bid, anticipated to be lifelong  We can do a tandem appointment at his next LVAD clinic appointment  UGIB = appears resolved, hemoglobin still not back to baseline. Continue to monitor cbc  Will sign off. Call if questions.  I have personally spent 85 minutes involved in face-to-face and non-face-to-face activities for this patient on the day of the visit. Professional time spent includes the following activities: Preparing to see the patient (review of tests), Obtaining and/or reviewing separately obtained history (admission/discharge record), Performing a medically appropriate examination and/or evaluation , Ordering medications/tests/procedures, referring and communicating with other health care professionals, Documenting clinical information in the EMR, Independently interpreting results (not separately reported), Communicating results to the patient/family/caregiver, Counseling and educating the patient

## 2023-07-08 NOTE — TOC Initial Note (Signed)
 Transition of Care [facility]) - Initial/Assessment Note    Patient Details  Name: Jon Martin MRN: [id number] Date of Birth: 1985/10/21  [facility] CM/SW Contact:    Ernst Heap Phone Number: [phone] 07/08/2023, 1:47 PM  Clinical Narrative:  HF CSW met with patient at bedside. Patient stated that he lives with his spouse. Patient stated that he he has no history of HH services. Patient stated that he uses a cane. Patient stated that he drives, but wife will provide transportation at dc. Patient stated that he does not work. CSW explained that a hospital follow up appointment is typically scheduled closer towards DC. Patient agrees.   TOC will continue following.                        Patient Goals and CMS Choice            Expected Discharge Plan and Services                                              Prior Living Arrangements/Services                       Activities of Daily Living   ADL Screening (condition at time of admission) Independently performs ADLs?: Yes (appropriate for developmental age) Is the patient deaf or have difficulty hearing?: No Does the patient have difficulty seeing, even when wearing glasses/contacts?: No Does the patient have difficulty concentrating, remembering, or making decisions?: No  Permission Sought/Granted                  Emotional Assessment              Admission diagnosis:  GI bleed [K92.2] Patient Active Problem List   Diagnosis Date Noted   Anemia 07/07/2023   Coffee ground emesis 07/06/2023   GI bleed 07/04/2023   Malnutrition of moderate degree 05/23/2020   MSSA (methicillin susceptible Staphylococcus aureus) infection    Loss of teeth due to extraction    Retained dental root    Chronic periodontitis    Complication involving left ventricular assist device (LVAD) 04/28/2020   Dental caries 04/28/2020   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   Left ventricular assist device (LVAD) complication 01/12/2020   Dehydration, moderate 08/02/2019   Chronic systolic heart failure (HCC) 08/02/2019   AKI (acute kidney injury) (HCC) 08/02/2019   Nausea and vomiting in adult 08/02/2019   LVAD (left ventricular assist device) present (HCC) 08/02/2019   Hypokalemia 08/02/2019   Congestive heart failure (HCC)    Cardiogenic shock (HCC)    Goals of care, counseling/discussion    Protein-calorie malnutrition, severe 02/11/2019   Dilated cardiomyopathy (HCC) 02/10/2019   PCP:  Maryanne Smiles, MD Pharmacy:   [facility] [address] [place] - [address] [place] [address] Phone: [phone] Fax: [phone]  [facility] [address] [place], [place] - [address] [place] [address] Phone: [phone] Fax: [phone]  [facility] [address] [place] [address] Phone: [phone] Fax: [phone]     Social Drivers of Health (SDOH) Social History: SDOH Screenings   Food Insecurity: No Food Insecurity (07/06/2023)  Housing: Low  Risk  (07/06/2023)  Transportation Needs: No Transportation Needs (07/06/2023)  Utilities: Not At Risk (07/06/2023)  Alcohol Screen: Medium Risk (03/05/2019)  Depression (PHQ2-9): Low Risk  (02/22/2019)  Financial Resource Strain: High Risk (07/04/2022)  Physical Activity: Sufficiently Active (03/05/2019)  Social Connections: Unknown (07/06/2023)  Tobacco Use: Medium Risk (07/06/2023)   SDOH Interventions:     Readmission Risk Interventions     No data to display

## 2023-07-08 NOTE — TOC Initial Note (Signed)
 Transition of Care [facility]) - Initial/Assessment Note    Patient Details  Name: Jon Martin MRN: [id number] Date of Birth: [birth year]  [facility]) CM/SW Contact:    Benjiman Bras, RN Phone Number: [phone] 07/08/2023, 2:42 PM  Clinical Narrative:                 Spoke to pt at bedside. States he is independent at home. He drives but car is currently needing repair. Needs new PCP, states he will call his sister to see who she uses. Will arrange new PCP appt.   Will continue to follow for dc needs.   Expected Discharge Plan: Home/Self Care Barriers to Discharge: Continued Medical Work up   Patient Goals and CMS Choice Patient states their goals for this hospitalization and ongoing recovery are:: wants to remain independent          Expected Discharge Plan and Services   Discharge Planning Services: CM Consult   Living arrangements for the past 2 months: Single Family Home                                      Prior Living Arrangements/Services Living arrangements for the past 2 months: Single Family Home Lives with:: Siblings Patient language and need for interpreter reviewed:: Yes        Need for Family Participation in Patient Care: No (Comment) Care giver support system in place?: No (comment) Current home services: DME (scale) Criminal Activity/Legal Involvement Pertinent to Current Situation/Hospitalization: No - Comment as needed  Activities of Daily Living   ADL Screening (condition at time of admission) Independently performs ADLs?: Yes (appropriate for developmental age) Is the patient deaf or have difficulty hearing?: No Does the patient have difficulty seeing, even when wearing glasses/contacts?: No Does the patient have difficulty concentrating, remembering, or making decisions?: No  Permission Sought/Granted Permission sought to share information with : Case Manager, Family Supports, PCP Permission granted to share  information with : Yes, Verbal Permission Granted  Share Information with NAME: Verneda Golder  Permission granted to share info w AGENCY: PCP  Permission granted to share info w Relationship: sister  Permission granted to share info w Contact Information: [phone]  Emotional Assessment Appearance:: Appears stated age Attitude/Demeanor/Rapport: Engaged Affect (typically observed): Accepting Orientation: : Oriented to Self, Oriented to Place, Oriented to  Time, Oriented to Situation   Psych Involvement: No (comment)  Admission diagnosis:  GI bleed [K92.2] Patient Active Problem List   Diagnosis Date Noted   Anemia 07/07/2023   Coffee ground emesis 07/06/2023   GI bleed 07/04/2023   Malnutrition of moderate degree 05/23/2020   MSSA (methicillin susceptible Staphylococcus aureus) infection    Loss of teeth due to extraction    Retained dental root    Chronic periodontitis    Complication involving left ventricular assist device (LVAD) 04/28/2020   Dental caries 04/28/2020   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/2020   Left ventricular assist device (LVAD) complication 01/12/2020   Dehydration, moderate 08/02/2019   Chronic systolic heart failure (HCC) 08/02/2019   AKI (acute kidney injury) (HCC) 08/02/2019   Nausea and vomiting in adult 08/02/2019   LVAD (left ventricular assist device) present (HCC) 08/02/2019   Hypokalemia 08/02/2019   Congestive heart failure (HCC)    Cardiogenic shock (HCC)    Goals of care, counseling/discussion  Protein-calorie malnutrition, severe 02/11/2019   Dilated cardiomyopathy (HCC) 02/10/2019   PCP:  Maryanne Smiles, MD Pharmacy:   [facility] [address] [place] [facility] [address] [place] [address] Phone: [phone] Fax: [phone]  [facility] [address] [place], [place] - [address] [place] [address] Phone:  [phone] Fax: [phone]  [facility] [address] [place] [address] Phone: [phone] Fax: [phone]     Social Drivers of Health (SDOH) Social History: SDOH Screenings   Food Insecurity: No Food Insecurity (07/06/2023)  Housing: Low Risk  (07/06/2023)  Transportation Needs: No Transportation Needs (07/06/2023)  Utilities: Not At Risk (07/06/2023)  Alcohol Screen: Medium Risk (03/05/2019)  Depression (PHQ2-9): Low Risk  (02/22/2019)  Financial Resource Strain: High Risk (07/04/2022)  Physical Activity: Sufficiently Active (03/05/2019)  Social Connections: Unknown (07/06/2023)  Tobacco Use: Medium Risk (07/06/2023)   SDOH Interventions:     Readmission Risk Interventions     No data to display

## 2023-07-08 NOTE — Plan of Care (Signed)
  Problem: Education: Goal: Knowledge of General Education information will improve Description: Including pain rating scale, medication(s)/side effects and non-pharmacologic comfort measures Outcome: Progressing   Problem: Health Behavior/Discharge Planning: Goal: Ability to manage health-related needs will improve Outcome: Progressing   Problem: Clinical Measurements: Goal: Ability to maintain clinical measurements within normal limits will improve Outcome: Progressing Goal: Will remain free from infection Outcome: Progressing Goal: Diagnostic test results will improve Outcome: Progressing Goal: Respiratory complications will improve Outcome: Progressing Goal: Cardiovascular complication will be avoided Outcome: Progressing   Problem: Activity: Goal: Risk for activity intolerance will decrease Outcome: Progressing   Problem: Nutrition: Goal: Adequate nutrition will be maintained Outcome: Progressing   Problem: Coping: Goal: Level of anxiety will decrease Outcome: Progressing   Problem: Elimination: Goal: Will not experience complications related to bowel motility Outcome: Progressing Goal: Will not experience complications related to urinary retention Outcome: Progressing   Problem: Pain Managment: Goal: General experience of comfort will improve and/or be controlled Outcome: Progressing   Problem: Safety: Goal: Ability to remain free from injury will improve Outcome: Progressing   Problem: Skin Integrity: Goal: Risk for impaired skin integrity will decrease Outcome: Progressing   Problem: Education: Goal: Patient will understand all VAD equipment and how it functions Outcome: Progressing Goal: Patient will be able to verbalize current INR target range and antiplatelet therapy for discharge home Outcome: Progressing   Problem: Cardiac: Goal: LVAD will function as expected and patient will experience no clinical alarms Outcome: Progressing

## 2023-07-08 NOTE — Progress Notes (Incomplete)
 Cefadroxil  TOC

## 2023-07-08 NOTE — Progress Notes (Signed)
 Patient ID: Jon Martin, male   DOB: 1985/04/28, 38 y.o.   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  IV PPI IV Reglan  Ampicillin   Labs today-WBC 8.2/hemoglobin 8.6/hematocrit 24.5/platelets 64 Sodium 133/potassium 3.1/BUN 6/creatinine 0.78  Patient says he feels fine, eating without difficulty no nausea or vomiting, has not had a bowel movement today but reports that the bowel movement yesterday was brown and very normal-appearing   Objective   Vital signs in last 24 hours: Temp:  [98.3 F (36.8 C)-99.5 F (37.5 C)] 99 F (37.2 C) (06/03 1055) Pulse Rate:  [96-118] 103 (06/03 0757) Resp:  [14-20] 20 (06/03 1055) BP: (93-109)/(67-84) 95/67 (06/03 1055) SpO2:  [97 %-100 %] 97 % (06/03 1055) Weight:  [51.5 kg-51.7 kg] 51.5 kg (06/03 0436) Last BM Date :  (PTA) General:    Young African-American male in NAD Heart: LVAD hum Lungs: Respirations even and unlabored, lungs CTA bilaterally Abdomen:  Soft, nontender and nondistended. Normal bowel sounds. Extremities:  Without edema. Neurologic:  Alert and oriented,  grossly normal neurologically. Psych:  Cooperative. Normal mood and affect.  Intake/Output from previous day: 06/02 0701 - 06/03 0700 In: 1536.5 [P.O.:600; IV Piggyback:936.5] Out: 2650 [Urine:2650] Intake/Output this shift: No intake/output data recorded.  Lab Results: Recent Labs    07/06/23 0228 07/07/23 0826 07/08/23 0215  WBC 6.5 7.6 8.2  HGB 11.0* 9.2* 8.6*  HCT 31.8* 26.0* 24.5*  PLT 115* 76* 64*   BMET Recent Labs    07/07/23 1127 07/07/23 2136 07/08/23 0215  NA 132* 131* 133*  K 2.7* 3.2* 3.1*  CL 102 101 102  CO2 23 21* 22  GLUCOSE 119* 128* 127*  BUN 5* 5* 6  CREATININE 0.76 0.90 0.78  CALCIUM 8.3* 8.2* 8.4*    LFT No results for input(s): "PROT", "ALBUMIN ", "AST", "ALT", "ALKPHOS", "BILITOT", "BILIDIR", "IBILI" in the last 72 hours. PT/INR Recent Labs    07/06/23 0228  LABPROT 12.8  INR 0.9         Assessment / Plan:    #86  38 year old male admitted with nausea vomiting and dark emesis in setting of severe NICM status post LVAD 2021 and with history of polysubstance abuse  Patient has not had any further active emesis or coffee-ground emesis over the past 2 days Stool brown yesterday, no bowel movement since  Interesting hemoglobin continues to drift but also has developed thrombocytopenia-no clear evidence for ongoing GI bleeding Normal BUN  #2 hypokalemia correcting  Plan; continue to allow solid diet Stop IV Reglan  Continue IV PPI today Continue to trend CBC Requested patient allow nurses to see stool if any further bowel movements       Principal Problem:   GI bleed Active Problems:   Nausea and vomiting in adult   Coffee ground emesis   Anemia     LOS: 4 days   Ciearra Rufo PA-C 07/08/2023, 12:06 PM

## 2023-07-08 NOTE — Progress Notes (Addendum)
 Advanced Heart Failure VAD Team Note  PCP-Cardiologist: Dr. Julane Ny   Chief Complaint: UGIB and DL Infection   Patient Profile:    38 y.o. male with history of chronic systolic CHF/NICM s/p HM III VAD, hx driveline infection, hx polysubstance abuse.   Admitted with upper GI bleed and DL infection.    Subjective:    He denies any further gross bleeding, denying hematemesis. no melena or hematochezia but Hgb continues to trend down.   Hgb 12.1>>10.7>>11.0>>9.2>>8.6 today   N/V resolved w/ Reglan . Still getting IV Protonix . QTc ok on EKG, 440 ms   K remains low at 3.1 despite aggressive K supp yesterday. Mg level pending   Remains on abx for DL infection. Wound Cx growing normal skin flora (no group A strep nor staph aureus isolated). Abx switched yesterday to Unasyn.   LDH 155>>216 today   He feels ok today. No further GI symptoms per above. Denies dyspnea. Had breakfast this morning. Tolerating solid diet ok.   LVAD INTERROGATION:  HeartMate III LVAD:   Flow 3.8 liters/min, speed 5400, power 3.9, PI 4.8.  2 PI events   Objective:    Vital Signs:   Temp:  [98.3 F (36.8 C)-99.5 F (37.5 C)] 99 F (37.2 C) (06/03 0738) Pulse Rate:  [96-118] 118 (06/03 0738) Resp:  [14-20] 19 (06/03 0738) BP: (93-109)/(75-84) 93/75 (06/03 0738) SpO2:  [98 %-100 %] 100 % (06/03 0738) Weight:  [51.5 kg-51.7 kg] 51.5 kg (06/03 0436) Last BM Date :  (PTA) Mean arterial Pressure 83  Intake/Output:   Intake/Output Summary (Last 24 hours) at 07/08/2023 0748 Last data filed at 07/08/2023 0500 Gross per 24 hour  Intake 1508.15 ml  Output 2650 ml  Net -1141.85 ml     Physical Exam    General:  thin young male, no distress  HEENT: normal Neck: supple. JVP not elevated . Carotids 2+ bilat; no bruits. No lymphadenopathy or thyromegaly appreciated. Cor: Mechanical heart sounds with LVAD hum present. Lungs: CTAB  Abdomen: soft, nontender, nondistended. No hepatosplenomegaly. No bruits or  masses. Good bowel sounds. Driveline: C/D/I; securement device intact and driveline incorporated Extremities: no cyanosis, clubbing, rash, no  Neuro: alert & orientedx3, cranial nerves grossly intact. moves all 4 extremities w/o difficulty. Affect pleasant   Telemetry   NSR 90s, personally reviewed   EKG    NSR 92 bpm, QTc 440 ms, personally reviewed   Labs   Basic Metabolic Panel: Recent Labs  Lab 07/05/23 1834 07/06/23 0228 07/07/23 0207 07/07/23 1127 07/07/23 2136 07/08/23 0215  NA  --  142 134* 132* 131* 133*  K  --  3.2* 2.7* 2.7* 3.2* 3.1*  CL  --  105 100 102 101 102  CO2  --  23 22 23  21* 22  GLUCOSE  --  117* 119* 119* 128* 127*  BUN  --  14 8 5* 5* 6  CREATININE  --  1.02 0.84 0.76 0.90 0.78  CALCIUM  --  9.6 8.8* 8.3* 8.2* 8.4*  MG 1.9  --  1.5*  --   --   --     Liver Function Tests: Recent Labs  Lab 07/04/23 1427  AST 73*  ALT 34  ALKPHOS 62  BILITOT 2.1*  PROT 8.6*  ALBUMIN  4.9   No results for input(s): "LIPASE", "AMYLASE" in the last 168 hours. No results for input(s): "AMMONIA" in the last 168 hours.  CBC: Recent Labs  Lab 07/04/23 1427 07/05/23 6578 07/06/23 0228 07/07/23 4696 07/08/23 0215  WBC 11.8* 7.4 6.5 7.6 8.2  NEUTROABS 8.9*  --   --   --   --   HGB 12.1* 10.7* 11.0* 9.2* 8.6*  HCT 36.3* 31.8* 31.8* 26.0* 24.5*  MCV 108.0* 105.3* 103.6* 101.6* 101.7*  PLT 149* 124* 115* 76* 64*    INR: Recent Labs  Lab 07/04/23 1427 07/05/23 0415 07/06/23 0228  INR 1.0 1.0 0.9    Other results: EKG:    Imaging   No results found.   Medications:     Scheduled Medications:  metoCLOPramide  (REGLAN ) injection  5 mg Intravenous Q8H   pantoprazole  (PROTONIX ) IV  40 mg Intravenous Q12H    Infusions:  ampicillin -sulbactam (UNASYN) IV 3 g (07/08/23 0506)    PRN Medications: acetaminophen , ondansetron  (ZOFRAN ) IV   Assessment/Plan:     1. Upper GI bleed / Intractable nausea & vomiting - N/V Improved w/ Reglan   -  Denies any further gross bleeding - Hgb continues to drop 12.1>>10.7>>11.0>>9.2>>8.6 today - Deferring need for EGD to GI  - Continue Reglan  and IV Protonix  per GI. QTc 440 ms on EKG (monitor)  - appreciate GI team assistance   2. Chronic systolic HF due to severe NICM s/p VAD - Echo EF 10% with biventricular failure - HM-3 VAD implant 02/26/19 - Echo 7/21 EF 40-45% - Euvolemic on exam  - Bump in LDH, 155>>216 (monitor)  - of note, not on warfarin due to labile INRs    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 Initial episode 3/22. Recurrent infection 2/23 requiring debridement and wound vac.  - Grew E. Coli, Proteus, and E Faecalis on cultures in 04/25. Has been on chronic suppressive cefadroxil  and was started on levaquin  following recent culture results - placed on ampicillin  on this admit  - Wound Cx from this admit growing normal skin flora (no group A strep nor staph aureus isolated) - ID team switched to Unasyn 3 g IV Q6H. They will leave formal consult note today, appreciate their assistance    4. VAD - Admit 12/21 with multiple low flow alarms. Rising LDH and decreasing PLTs. Suspected outflow graft kink occlusion. Patient placed on his left side and resolved.  - VAD interrogated personally. Parameters stable. - DL site management as above - LDH 155>>216 today, INR on admission 0.9 (off warfarin). Monitor LDH trends    5. Lactic acidosis - resolved with IVF & abx.    6. Hx substance abuse (cocaine and ETOH) - UDS + for cannibinoids and cocaine at OSH - Denies cocaine use  7. Hypokalemia/Hypomagnesemia - K 3.1 today  - D/w PharmD will give 6 additional runs of IV KCl and check Mg level - Monitor closely for QT prolongation, in setting of Reglan  + IV Protonix  - QTc on today's EKG 440 ms. Keep on tele     I reviewed the LVAD parameters from today, and compared the results to the patient's prior recorded data.  No programming changes were made.  The LVAD is  functioning within specified parameters.  The patient performs LVAD self-test daily.  LVAD interrogation was negative for any significant power changes, alarms or PI events/speed drops.  LVAD equipment check completed and is in good working order.  Back-up equipment present.   LVAD education done on emergency procedures and precautions and reviewed exit site care.  Length of Stay: [address], [place] 07/08/2023, 7:48 AM  VAD Team --- VAD ISSUES ONLY--- Pager [phone] (7am - 7am)  Advanced Heart Failure Team  Pager  [phone] (M-F; 7a - 5p)  Please contact [facility] for night-coverage after hours (5p -7a ) and weekends on [email or web address]  Patient seen with PA, I formulated the plan and agree with the above note.   Hgb lower, 9.2 => 8.6.  He does not describe overt bleeding.  He is now on Unasyn for driveline infection.  LDH is higher at 216 today.   General: Well appearing this am. NAD.  HEENT: Normal. Neck: Supple, JVP 7-8 cm. Carotids OK.  Cardiac:  Mechanical heart sounds with LVAD hum present.  Lungs:  CTAB, normal effort.  Abdomen:  NT, ND, no HSM. No bruits or masses. +BS  LVAD exit site: Well-healed and incorporated. Dressing dry and intact. No erythema or drainage. Stabilization device present and accurately applied. Driveline dressing changed daily per sterile technique. Extremities:  Warm and dry. No cyanosis, clubbing, rash, or edema.  Neuro:  Alert & oriented x 3. Cranial nerves grossly intact. Moves all 4 extremities w/o difficulty. Affect pleasant    GI following, hgb continues to trend down.  Awaiting plan today, ?EGD/enteroscopy.  Of note, patient has not been anticoagulated at home.    MAP generally 80s, no antihypertensive for now.    Currently on Unasyn for driveline infection.  Await long-term plan from ID.     LVAD interrogated, parameters stable.  Higher LDH, follow closely.   Peder Bourdon 07/08/2023 12:37 PM

## 2023-07-09 ENCOUNTER — Inpatient Hospital Stay (HOSPITAL_COMMUNITY): Admitting: Anesthesiology

## 2023-07-09 ENCOUNTER — Other Ambulatory Visit (HOSPITAL_COMMUNITY): Payer: Self-pay

## 2023-07-09 ENCOUNTER — Encounter (HOSPITAL_COMMUNITY)
Admission: AD | Disposition: A | Discharge status: Home / Self Care | Payer: Self-pay | Source: Ambulatory Visit | Attending: Cardiology

## 2023-07-09 ENCOUNTER — Encounter (HOSPITAL_COMMUNITY): Payer: Self-pay | Admitting: Cardiology

## 2023-07-09 DIAGNOSIS — K449 Diaphragmatic hernia without obstruction or gangrene: Secondary | ICD-10-CM | POA: Diagnosis not present

## 2023-07-09 DIAGNOSIS — K3189 Other diseases of stomach and duodenum: Secondary | ICD-10-CM | POA: Diagnosis not present

## 2023-07-09 DIAGNOSIS — K920 Hematemesis: Secondary | ICD-10-CM | POA: Diagnosis not present

## 2023-07-09 DIAGNOSIS — B3781 Candidal esophagitis: Secondary | ICD-10-CM

## 2023-07-09 DIAGNOSIS — K209 Esophagitis, unspecified without bleeding: Secondary | ICD-10-CM | POA: Diagnosis not present

## 2023-07-09 DIAGNOSIS — D649 Anemia, unspecified: Secondary | ICD-10-CM | POA: Diagnosis not present

## 2023-07-09 DIAGNOSIS — Z95811 Presence of heart assist device: Secondary | ICD-10-CM | POA: Diagnosis not present

## 2023-07-09 LAB — CULTURE, BLOOD (ROUTINE X 2)
Culture: NO GROWTH
Culture: NO GROWTH

## 2023-07-09 LAB — BASIC METABOLIC PANEL WITH GFR
Anion gap: 7 (ref 5–15)
BUN: 7 mg/dL (ref 6–20)
CO2: 22 mmol/L (ref 22–32)
Calcium: 9.3 mg/dL (ref 8.9–10.3)
Chloride: 104 mmol/L (ref 98–111)
Creatinine, Ser: 0.81 mg/dL (ref 0.61–1.24)
GFR, Estimated: >60 mL/min (ref >60)
Glucose, Bld: 123 mg/dL — ABNORMAL HIGH (ref 70–99)
Potassium: 3.5 mmol/L (ref 3.5–5.1)
Sodium: 133 mmol/L — ABNORMAL LOW (ref 135–145)

## 2023-07-09 LAB — CBC
HCT: 24.3 % — ABNORMAL LOW (ref 39.0–52.0)
Hemoglobin: 8.5 g/dL — ABNORMAL LOW (ref 13.0–17.0)
MCH: 35.9 pg — ABNORMAL HIGH (ref 26.0–34.0)
MCHC: 35 g/dL (ref 30.0–36.0)
MCV: 102.5 fL — ABNORMAL HIGH (ref 80.0–100.0)
Platelets: 68 10*3/uL — ABNORMAL LOW (ref 150–400)
RBC: 2.37 MIL/uL — ABNORMAL LOW (ref 4.22–5.81)
RDW: 14 % (ref 11.5–15.5)
WBC: 9.1 10*3/uL (ref 4.0–10.5)
nRBC: 0 % (ref 0.0–0.2)

## 2023-07-09 LAB — LACTATE DEHYDROGENASE: LDH: 172 U/L (ref 98–192)

## 2023-07-09 LAB — MAGNESIUM: Magnesium: 1.7 mg/dL (ref 1.7–2.4)

## 2023-07-09 SURGERY — EGD (ESOPHAGOGASTRODUODENOSCOPY)
Anesthesia: Monitor Anesthesia Care

## 2023-07-09 MED ORDER — PANTOPRAZOLE SODIUM 40 MG PO TBEC
40.0000 mg | DELAYED_RELEASE_TABLET | Freq: Every day | ORAL | 0 refills | Status: DC
  Filled 2023-07-09 (×2): qty 30, 30d supply, fill #0

## 2023-07-09 MED ORDER — GLYCOPYRROLATE 0.2 MG/ML IJ SOLN
INTRAMUSCULAR | Status: DC | PRN
  Administered 2023-07-09: .2 mg via INTRAVENOUS

## 2023-07-09 MED ORDER — SODIUM CHLORIDE 0.9 % IV SOLN
INTRAVENOUS | Status: DC | PRN
Start: 2023-07-09 — End: 2023-07-09

## 2023-07-09 MED ORDER — PROPOFOL 500 MG/50ML IV EMUL
INTRAVENOUS | Status: DC | PRN
  Administered 2023-07-09: 100 ug/kg/min via INTRAVENOUS

## 2023-07-09 MED ORDER — LIDOCAINE 2% (20 MG/ML) 5 ML SYRINGE
INTRAMUSCULAR | Status: DC | PRN
  Administered 2023-07-09: 100 mg via INTRAVENOUS

## 2023-07-09 MED ORDER — MAGNESIUM SULFATE 4 GM/100ML IV SOLN
4.0000 g | Freq: Once | INTRAVENOUS | Status: AC
  Administered 2023-07-09: 4 g via INTRAVENOUS
  Filled 2023-07-09: qty 100

## 2023-07-09 MED ORDER — FLUCONAZOLE 200 MG PO TABS
400.0000 mg | ORAL_TABLET | Freq: Once | ORAL | Status: AC
  Administered 2023-07-09: 400 mg via ORAL
  Filled 2023-07-09: qty 2

## 2023-07-09 MED ORDER — PROPOFOL 10 MG/ML IV BOLUS
INTRAVENOUS | Status: DC | PRN
  Administered 2023-07-09: 50 mg via INTRAVENOUS
  Administered 2023-07-09 (×2): 20 mg via INTRAVENOUS

## 2023-07-09 MED ORDER — DEXAMETHASONE SODIUM PHOSPHATE 10 MG/ML IJ SOLN
INTRAMUSCULAR | Status: DC | PRN
  Administered 2023-07-09: 10 mg via INTRAVENOUS

## 2023-07-09 MED ORDER — CEFADROXIL 500 MG PO CAPS
500.0000 mg | ORAL_CAPSULE | Freq: Two times a day (BID) | ORAL | 11 refills | Status: DC
  Filled 2023-07-09 (×2): qty 60, 30d supply, fill #0

## 2023-07-09 MED ORDER — PHENYLEPHRINE 80 MCG/ML (10ML) SYRINGE FOR IV PUSH (FOR BLOOD PRESSURE SUPPORT)
PREFILLED_SYRINGE | INTRAVENOUS | Status: DC | PRN
  Administered 2023-07-09: 80 ug via INTRAVENOUS
  Administered 2023-07-09: 160 ug via INTRAVENOUS

## 2023-07-09 MED ORDER — FLUCONAZOLE 100 MG PO TABS: 100.0000 mg | ORAL_TABLET | Freq: Every day | ORAL | Status: DC

## 2023-07-09 MED ORDER — ONDANSETRON HCL 4 MG/2ML IJ SOLN
INTRAMUSCULAR | Status: DC | PRN
  Administered 2023-07-09: 4 mg via INTRAVENOUS

## 2023-07-09 MED ORDER — FENTANYL CITRATE (PF) 100 MCG/2ML IJ SOLN
INTRAMUSCULAR | Status: DC | PRN
  Administered 2023-07-09: 50 ug via INTRAVENOUS

## 2023-07-09 MED ORDER — FLUCONAZOLE 200 MG PO TABS
200.0000 mg | ORAL_TABLET | Freq: Every day | ORAL | 0 refills | Status: DC
  Filled 2023-07-09 (×2): qty 20, 20d supply, fill #0

## 2023-07-09 MED ORDER — FLUCONAZOLE 200 MG PO TABS: 200.0000 mg | ORAL_TABLET | Freq: Every day | ORAL | Status: DC

## 2023-07-09 MED ORDER — FLUCONAZOLE 200 MG PO TABS
400.0000 mg | ORAL_TABLET | Freq: Once | ORAL | Status: DC
  Filled 2023-07-09: qty 2

## 2023-07-09 MED ORDER — FLUCONAZOLE 100 MG PO TABS
100.0000 mg | ORAL_TABLET | Freq: Every day | ORAL | 0 refills | Status: DC
Start: 2023-07-10 — End: 2023-07-09
  Filled 2023-07-09: qty 14, 14d supply, fill #0

## 2023-07-09 MED ORDER — FLUCONAZOLE 200 MG PO TABS: 200.0000 mg | ORAL_TABLET | Freq: Once | ORAL | Status: DC

## 2023-07-09 MED ORDER — PANTOPRAZOLE SODIUM 40 MG PO TBEC: 40.0000 mg | DELAYED_RELEASE_TABLET | Freq: Every day | ORAL | Status: DC

## 2023-07-09 MED ORDER — POTASSIUM CHLORIDE CRYS ER 20 MEQ PO TBCR
40.0000 meq | EXTENDED_RELEASE_TABLET | Freq: Once | ORAL | Status: AC
  Administered 2023-07-09: 40 meq via ORAL
  Filled 2023-07-09 (×2): qty 2

## 2023-07-09 NOTE — Progress Notes (Addendum)
 LVAD Coordinator Rounding Note:  Admitted 02/26/21 to Heart Failure service for possible GIB.   HM III LVAD implanted on 02/26/19 by Dr. Jeb Miner under Destination Therapy criteria.  Patient presented to ER at [facility] in [place] with vomiting - coffee ground like and diarrhea. Pt was + for THC and cocaine.  Pt laying in bed on my arrival. Denies complaints. States he is feeling better this morning. Plan for EGD today per GI. VAD Coordinator accompanied pt to procedure.  Pt has hx of drive line infection. ID consulted for antibiotic recommendations while admitted. Plan for Unasyn while hospitalized then to resume chronic suppressive Cefadroxil  500mg  BID.  Vital signs: Temp: 98.9 HR: 100 Doppler Pressure: 92 Automatic BP: 111/96 (102) O2 Sat: 100% RA Wt: 110.2>113.5>113.54 lbs  LVAD interrogation reveals:  Speed: 5400 Flow: 3.8 Power: 3.9w PI: 5.7 Hct: 20 - DO NOT CHANGE   Alarms: none Events: 5 PI today  Fixed speed: 5400 Low speed limit: 5100  Drive Line: Existing VAD dressing removed and site care performed using sterile technique. Drive line exit site cleaned with Chlora prep applicators x 2, allowed to dry, and gauze dressing applied. Exit site healed and incorporated, the velour is exposed significantly at exit site. No redness, tenderness, small amount of brown drainage with foul odor and no rash noted. Small abscess remains present above drive line non tender to the touch. Unable to express anything from area. Pt advised to notify VAD team if abscess gets bigger or he has increasing drainage. Drive line anchor re-applied. Return to Monday Thursday dressings at home.      Labs:  LDH trend: 240 155>216>172  Lactic acid: 2.8>5.6>1.6  Hgb trend: 12.1>10.7>11.0>9.2>8.6>8.5  Anticoagulation Plan: -INR Goal: not on coumadin  -ASA Dose: none  Blood Products:   Device: N/A  Infection:  07/04/23 Drive line wound culture>>normal skin flora 07/04/23 Blood cultures >>no  growth x 4 days; final pending  Drips:    Plan/Recommendations:  1. Call VAD Coordinator if any VAD equipment or drive line issues. 2. Driveline dressing changes MWF per bedside nurse.    Laurice Pope RN, BSN VAD Coordinator 24/7 Pager [phone]

## 2023-07-09 NOTE — Progress Notes (Addendum)
 Advanced Heart Failure VAD Team Note  PCP-Cardiologist: Dr. Julane Ny   Chief Complaint: UGIB and DL Infection   Patient Profile:    38 y.o. male with history of chronic systolic CHF/NICM s/p HM III VAD, hx driveline infection, hx polysubstance abuse.   Admitted with upper GI bleed and DL infection.    Subjective:    Denies any further GI bleeding.   Hgb 12.1>>10.7>>11.0>>9.2>>8.6>>8.5 today   LDH back down, 216>>172.  Haptaglobin pending  Fractionated bilirubin nl   K 3.5, Mg 1.7   Remains on abx for DL infection. Wound Cx growing normal skin flora (no group A strep nor staph aureus isolated).   Feels well no complaints. Denies dyspnea. No abdominal pain, n/v.     LVAD INTERROGATION:  HeartMate III LVAD:   Flow 3.9 liters/min, speed 5450, power 3.9, PI 5.5.  4 PI events   Objective:    Vital Signs:   Temp:  [99 F (37.2 C)-99.5 F (37.5 C)] 99.5 F (37.5 C) (06/04 0328) Pulse Rate:  [85-104] 85 (06/04 0328) Resp:  [17-21] 17 (06/04 0328) BP: (95-113)/(67-89) 103/89 (06/04 0328) SpO2:  [95 %-100 %] 95 % (06/04 0328) Weight:  [51.5 kg] 51.5 kg (06/04 0340) Last BM Date : 07/08/23 Mean arterial Pressure 90  Intake/Output:   Intake/Output Summary (Last 24 hours) at 07/09/2023 0742 Last data filed at 07/09/2023 0344 Gross per 24 hour  Intake 1240 ml  Output 1425 ml  Net -185 ml     Physical Exam    General:  well appearing. No distress  HEENT: normal  Neck:  JVP not elevated  Cor: Mechanical heart sounds with LVAD hum present. Lungs: clear  Abdomen: soft, nontender, nondistended. Driveline: C/D/I; securement device intact and driveline incorporated Extremities: no cyanosis, clubbing, rash, no edema    Telemetry   NSR 60s, QTC 344 ms personally reviewed   EKG    N/A   Labs   Basic Metabolic Panel: Recent Labs  Lab 07/05/23 1834 07/06/23 0228 07/07/23 0207 07/07/23 1127 07/07/23 2136 07/08/23 0215 07/08/23 1950 07/09/23 0335  NA  --     < > 134* 132* 131* 133* 134* 133*  K  --    < > 2.7* 2.7* 3.2* 3.1* 3.7 3.5  CL  --    < > 100 102 101 102 104 104  CO2  --    < > 22 23 21* 22 21* 22  GLUCOSE  --    < > 119* 119* 128* 127* 143* 123*  BUN  --    < > 8 5* 5* 6 8 7   CREATININE  --    < > 0.84 0.76 0.90 0.78 1.08 0.81  CALCIUM  --    < > 8.8* 8.3* 8.2* 8.4* 9.0 9.3  MG 1.9  --  1.5*  --   --  2.0  --  1.7   < > = values in this interval not displayed.    Liver Function Tests: Recent Labs  Lab 07/04/23 1427 07/08/23 1950  AST 73*  --   ALT 34  --   ALKPHOS 62  --   BILITOT 2.1* 0.5  PROT 8.6*  --   ALBUMIN  4.9  --    No results for input(s): "LIPASE", "AMYLASE" in the last 168 hours. No results for input(s): "AMMONIA" in the last 168 hours.  CBC: Recent Labs  Lab 07/04/23 1427 07/05/23 0415 07/06/23 0228 07/07/23 0826 07/08/23 0215 07/09/23 0335  WBC 11.8* 7.4 6.5 7.6  8.2 9.1  NEUTROABS 8.9*  --   --   --   --   --   HGB 12.1* 10.7* 11.0* 9.2* 8.6* 8.5*  HCT 36.3* 31.8* 31.8* 26.0* 24.5* 24.3*  MCV 108.0* 105.3* 103.6* 101.6* 101.7* 102.5*  PLT 149* 124* 115* 76* 64* 68*    INR: Recent Labs  Lab 07/04/23 1427 07/05/23 0415 07/06/23 0228  INR 1.0 1.0 0.9    Other results: EKG:    Imaging   No results found.   Medications:     Scheduled Medications:  pantoprazole  (PROTONIX ) IV  40 mg Intravenous Q12H    Infusions:  ampicillin -sulbactam (UNASYN) IV 3 g (07/09/23 0348)    PRN Medications: acetaminophen , ondansetron  (ZOFRAN ) IV   Assessment/Plan:     1. Upper GI bleed / Intractable nausea & vomiting - N/V Improved w/ Reglan . Reglan  now discontinued. Tolerating diet.   - Denies any further gross bleeding - Hgb down but stable past 24 hrs 12.1>>10.7>>11.0>>9.2>>8.6>>8.5 today - GI team following. They will determine need for EGD. Keep NPO for now  - continue IV protoxin bid  - appreciate GI team assistance   2. Chronic systolic HF due to severe NICM s/p VAD - Echo EF 10%  with biventricular failure - HM-3 VAD implant 02/26/19 - Echo 7/21 EF 40-45% - Euvolemic on exam  - LDH ok  172  - of note, not on warfarin due to labile INRs    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 Initial episode 3/22. Recurrent infection 2/23 requiring debridement and wound vac.  - Grew E. Coli, Proteus, and E Faecalis on cultures in 04/25. Has been on chronic suppressive cefadroxil  and was started on levaquin  following recent culture results - placed on ampicillin  on this admit  - Wound Cx from this admit growing normal skin flora (no group A strep nor staph aureus isolated) - ID team switched to Unasyn 3 g IV Q6H. Per recommendations, for the time being, continue on Unasyn while hospitalized. Once ready for discharge, plan to resume cefadroxil  1000mg  po bid for lifelong suppression     4. VAD - Admit 12/21 with multiple low flow alarms. Rising LDH and decreasing PLTs. Suspected outflow graft kink occlusion. Patient placed on his left side and resolved.  - VAD interrogated personally. Parameters stable. - DL site management as above - LDH 155>>216>>172 today, INR on admission 0.9 (off warfarin). Monitor LDH trends    5. Lactic acidosis - resolved with IVF & abx.    6. Hx substance abuse (cocaine and ETOH) - UDS + for cannibinoids and cocaine at OSH - Denies cocaine use  7. Hypokalemia/Hypomagnesemia - K 3.5, Mg 1.7  - give KCl and Mg supp, d/w pharm D   Awaiting final recs from GI team. If no plans for scope, can potentially d/c home today.     I reviewed the LVAD parameters from today, and compared the results to the patient's prior recorded data.  No programming changes were made.  The LVAD is functioning within specified parameters.  The patient performs LVAD self-test daily.  LVAD interrogation was negative for any significant power changes, alarms or PI events/speed drops.  LVAD equipment check completed and is in good working order.  Back-up equipment present.    LVAD education done on emergency procedures and precautions and reviewed exit site care.  Length of Stay: [address], PA-C 07/09/2023, 7:42 AM  VAD Team --- VAD ISSUES ONLY--- Pager [phone] (7am - 7am)  Advanced  Heart Failure Team  Pager [phone] (M-F; 7a - 5p)  Please contact [facility] for night-coverage after hours (5p -7a ) and weekends on [email or web address]  Patient seen with PA, I formulated the plan and agree with the above note.   EGD done today, no definite source of bleeding but esophageal candidiasis noted.   Hgb stable at 8.5 today.    General: Well appearing this am. NAD.  HEENT: Normal. Neck: Supple, JVP 7-8 cm. Carotids OK.  Cardiac:  Mechanical heart sounds with LVAD hum present.  Lungs:  CTAB, normal effort.  Abdomen:  NT, ND, no HSM. No bruits or masses. +BS  LVAD exit site: Well-healed and incorporated. Dressing dry and intact. No erythema or drainage. Stabilization device present and accurately applied. Driveline dressing changed daily per sterile technique. Extremities:  Warm and dry. No cyanosis, clubbing, rash, or edema.  Neuro:  Alert & oriented x 3. Cranial nerves grossly intact. Moves all 4 extremities w/o difficulty. Affect pleasant    No active bleeding and he has not been anticoagulated.  Will treat for esophageal candidiasis with fluconazole .   ID has seen, ok to stop ampicillin  at discharge and start back on long-term cefadroxil .   Patient is stable for discharge today, will arrange close followup in LVAD clinic.   Jon Martin 07/09/2023 2:17 PM

## 2023-07-09 NOTE — Transfer of Care (Signed)
 Immediate Anesthesia Transfer of Care Note  Patient: Jon Martin  Procedure(s) Performed: EGD (ESOPHAGOGASTRODUODENOSCOPY)  Patient Location: PACU  Anesthesia Type:MAC  Level of Consciousness: awake, alert , and oriented  Airway & Oxygen Therapy: Patient Spontanous Breathing  Post-op Assessment: Report given to RN and Post -op Vital signs reviewed and stable  Post vital signs: Reviewed and stable  Last Vitals:  Vitals Value Taken Time  BP 85/63 07/09/23 1131  Temp 36.6 C 07/09/23 1131  Pulse 96 07/09/23 1131  Resp 18 07/09/23 1131  SpO2 97 % 07/09/23 1131  Vitals shown include unfiled device data.  Last Pain:  Vitals:   07/09/23 1131  TempSrc: Temporal  PainSc: 0-No pain         Complications: No notable events documented.

## 2023-07-09 NOTE — H&P (Signed)
 [facility] History and Physical   Primary Care Physician:  Maryanne Smiles, MD   Reason for Procedure:  Coffee-ground emesis, nausea, vomiting anemia  Plan:    Upper endoscopy     HPI: Jon Martin is a 38 y.o. male undergoing upper endoscopy for investigation of coffee-ground emesis, nausea, vomiting and anemia.  Patient was admitted to the hospital with abrupt onset nausea and vomiting.  Reported vomiting dark coffee-ground material.  No bright red blood.  In the hospital he has not had overt signs of bleeding, however, hemoglobin has slowly declined to a nadir of 8.5.  No further nausea or vomiting.  EGD is performed today for further investigation of anemia and GI symptoms.   Past Medical History:  Diagnosis Date   Dilated cardiomyopathy (HCC)    Dilated cardiomyopathy (HCC) 02/2019   Polysubstance abuse (HCC)     Past Surgical History:  Procedure Laterality Date   APPLICATION OF A-CELL OF CHEST/ABDOMEN  03/06/2021   Procedure: APPLICATION OF A-CELL OF CHEST/ABDOMEN;  Surgeon: Shon Downing, MD;  Location: [facility] OR;  Service: Thoracic;;   APPLICATION OF WOUND VAC Left 05/16/2020   Procedure: APPLICATION OF WOUND VAC;  Surgeon: Bartley Lightning, MD;  Location: [facility] OR;  Service: Vascular;  Laterality: Left;   APPLICATION OF WOUND VAC N/A 03/02/2021   Procedure: APPLICATION OF WOUND VAC;  Surgeon: Shon Downing, MD;  Location: [facility] OR;  Service: Thoracic;  Laterality: N/A;   APPLICATION OF WOUND VAC N/A 03/06/2021   Procedure: APPLICATION OF WOUND VAC;  Surgeon: Shon Downing, MD;  Location: [facility];  Service: Thoracic;  Laterality: N/A;   APPLICATION OF WOUND VAC N/A 03/12/2021   Procedure: WOUND VAC CHANGE;  Surgeon: Shon Downing, MD;  Location: [facility];  Service: Thoracic;  Laterality: N/A;   INCISION AND DRAINAGE OF WOUND Left 05/16/2020   Procedure: IRRIGATION AND DEBRIDEMENT VAD DRIVELINE WOUND;  Surgeon: Bartley Lightning, MD;  Location: [facility];  Service:  Vascular;  Laterality: Left;   INSERTION OF IMPLANTABLE LEFT VENTRICULAR ASSIST DEVICE N/A 02/26/2019   Procedure: INSERTION OF IMPLANTABLE LEFT VENTRICULAR ASSIST DEVICE;  Surgeon: Heriberto London, MD;  Location: [facility] OR;  Service: Open Heart Surgery;  Laterality: N/A;  HM3   IR THORACENTESIS ASP PLEURAL SPACE W/IMG GUIDE  03/10/2019   MULTIPLE EXTRACTIONS WITH ALVEOLOPLASTY N/A 05/04/2020   Procedure: MULTIPLE EXTRACTION TEETH FIVE AND FIFTEEN WITH ALVEOLOPLASTY;  Surgeon: Rene Carrier, DMD;  Location: [facility];  Service: Dentistry;  Laterality: N/A;   RIGHT HEART CATH N/A 02/25/2019   Procedure: RIGHT HEART CATH;  Surgeon: Mardell Shade, MD;  [facility]: [facility]VAS[facility];  Service: Cardiovascular;  Laterality: N/A;   RIGHT/LEFT HEART CATH AND CORONARY ANGIOGRAPHY N/A 02/11/2019   Procedure: RIGHT/LEFT HEART CATH AND CORONARY ANGIOGRAPHY;  Surgeon: Mardell Shade, MD;  [facility];  Service: Cardiovascular;  Laterality: N/A;   STERNAL WOUND DEBRIDEMENT N/A 03/02/2021   Procedure: VAD DRIVELINE WOUND DEBRIDEMENT;  Surgeon: Shon Downing, MD;  [facility];  Service: Thoracic;  Laterality: N/A;   STERNAL WOUND DEBRIDEMENT N/A 03/06/2021   Procedure: VAD DRIVELINE WOUND DEBRIDEMENT;  Surgeon: Shon Downing, MD;  [facility];  Service: Thoracic;  Laterality: N/A;   STERNAL WOUND DEBRIDEMENT N/A 03/12/2021   Procedure: VAD DRIVELINE WOUND DEBRIDEMENT;  Surgeon: Shon Downing, MD;  [facility]: [facility];  Service: Thoracic;  Laterality: N/A;   TEE WITHOUT CARDIOVERSION N/A 02/26/2019   Procedure: TRANSESOPHAGEAL ECHOCARDIOGRAM (TEE);  Surgeon: Carloyn Chi  Lannie Pizza, MD;  Location: [facility] OR;  Service: Open Heart Surgery;  Laterality: N/A;   VIDEO BRONCHOSCOPY N/A 02/26/2019   Procedure: Video Bronchoscopy;  Surgeon: Heriberto London, MD;  Location: [facility] OR;  Service: Open Heart Surgery;  Laterality: N/A;    Prior to Admission medications   Medication Sig Start Date End Date Taking?  Authorizing Provider  KLOR-CON  M20 20 MEQ tablet TAKE 2 TABLETS (40 MEQ) BY MOUTH DAILY, OR AS DIRECTED BY HEART FAILURE CLINIC 09/12/22  Yes Bensimhon, Rheta Celestine, MD    Current Facility-Administered Medications  Medication Dose Route Frequency Provider Last Rate Last Admin   [MAR Hold] acetaminophen  (TYLENOL ) tablet 650 mg  650 mg Oral Q4H PRN Arleene Belt, PA-C       [MAR Hold] Ampicillin -Sulbactam (UNASYN) 3 g in sodium chloride  0.9 % 100 mL IVPB  3 g Intravenous Q6H Liane Redman, MD 200 mL/hr at 07/09/23 1026 3 g at 07/09/23 1026   [MAR Hold] magnesium  sulfate IVPB 4 g 100 mL  4 g Intravenous Once Horace Lye, PA-C       [MAR Hold] ondansetron  (ZOFRAN ) injection 4 mg  4 mg Intravenous Q6H PRN Arleene Belt, PA-C   4 mg at 07/05/23 1758   [MAR Hold] pantoprazole  (PROTONIX ) injection 40 mg  40 mg Intravenous Q12H Lauralee Poll, MD   40 mg at 07/09/23 1022   [MAR Hold] potassium chloride  SA (KLOR-CON  M) CR tablet 40 mEq  40 mEq Oral Once Ruddy Corral M, PA-C        Allergies as of 07/04/2023   (No Known Allergies)    Family History  Problem Relation Age of Onset   CAD Father     Social History   Socioeconomic History   Marital status: Single    Spouse name: Not on file   Number of children: Not on file   Years of education: Not on file   Highest education level: Not on file  Occupational History   Not on file  Tobacco Use   Smoking status: Former    Types: Cigarettes   Smokeless tobacco: Never  Vaping Use   Vaping status: Some Days  Substance and Sexual Activity   Alcohol use: Yes   Drug use: Not Currently    Comment: Hx of cocaine use   Sexual activity: Not on file  Other Topics Concern   Not on file  Social History Narrative   Not on file   Social Drivers of Health   Financial Resource Strain: High Risk (07/04/2022)   Overall Financial Resource Strain (CARDIA)    Difficulty of Paying Living Expenses: Hard  Food Insecurity:  No Food Insecurity (07/06/2023)   Hunger Vital Sign    Worried About Running Out of Food in the Last Year: Never true    Ran Out of Food in the Last Year: Never true  Transportation Needs: No Transportation Needs (07/06/2023)   PRAPARE - Administrator, Civil Service (Medical): No    Lack of Transportation (Non-Medical): No  Physical Activity: Sufficiently Active (03/05/2019)   Exercise Vital Sign    Days of Exercise per Week: 7 days    Minutes of Exercise per Session: 60 min  Stress: Not on file  Social Connections: Unknown (07/06/2023)   Social Connection and Isolation Panel [NHANES]    Frequency of Communication with Friends and Family: Twice a week    Frequency of Social Gatherings with Friends and Family: More than three times a  week    Attends Religious Services: Patient declined    Active Member of Clubs or Organizations: Patient declined    Attends Banker Meetings: Patient declined    Marital Status: Patient declined  Intimate Partner Violence: Not At Risk (07/06/2023)   Humiliation, Afraid, Rape, and Kick questionnaire    Fear of Current or Ex-Partner: No    Emotionally Abused: No    Physically Abused: No    Sexually Abused: No    Review of Systems:  All other review of systems negative except as mentioned in the HPI.  Physical Exam: Vital signs BP (!) 111/96 (BP Location: Left Arm)   Pulse 99   Temp 98.9 F (37.2 C) (Oral)   Resp 16   Ht 5\' 5"  (1.651 m)   Wt 51.5 kg   SpO2 99%   BMI 18.89 kg/m   General:   Alert,  Well-developed, well-nourished, pleasant and cooperative in NAD Lungs:  Clear throughout to auscultation.   Heart: LVAD auscultated across precordium Abdomen:  Soft, nontender and nondistended. Normal bowel sounds.   Neuro/Psych:  Normal mood and affect. A and O x 3  Eugenia Hess, MD [facility]

## 2023-07-09 NOTE — Op Note (Signed)
 [facility] Patient Name: Jon Martin Procedure Date : 07/09/2023 MRN: [id number] Attending MD: Eugenia Hess , MD, [id number] Date of Birth: 1985-10-10 CSN: [id number] Age: 38 Admit Type: Inpatient Procedure:                Upper GI endoscopy Indications:              Coffee-ground emesis, anemia Providers:                Eugenia Hess, MD, Bradley Caffey, Judith Novak,                            Technician Referring MD:             Marolyn Sis t Anson Basta Medicines:                Monitored Anesthesia Care Complications:            No immediate complications. Estimated blood loss:                            None. Estimated Blood Loss:     Estimated blood loss: none. Procedure:                Pre-Anesthesia Assessment:                           - Prior to the procedure, a History and Physical                            was performed, and patient medications and                            allergies were reviewed. The patient's tolerance of                            previous anesthesia was also reviewed. The risks                            and benefits of the procedure and the sedation                            options and risks were discussed with the patient.                            All questions were answered, and informed consent                            was obtained. Prior Anticoagulants: The patient has                            taken no anticoagulant or antiplatelet agents. ASA                            Grade Assessment: III - A patient with severe  systemic disease. After reviewing the risks and                            benefits, the patient was deemed in satisfactory                            condition to undergo the procedure.                           After obtaining informed consent, the endoscope was                            passed under direct vision. Throughout the                            procedure, the  patient's blood pressure, pulse, and                            oxygen saturations were monitored continuously. The                            GIF-H190 ([id number]) Olympus endoscope was introduced                            through the mouth, and advanced to the second part                            of duodenum. The upper GI endoscopy was                            accomplished without difficulty. The patient                            tolerated the procedure well. Scope In: Scope Out: Findings:      Diffuse, white plaques were found in the entire esophagus.      The gastric body, gastric antrum, cardia (on retroflexion) and gastric       fundus (on retroflexion) were normal.      A small hiatal hernia was present.      Localized mildly erythematous mucosa without active bleeding and with no       stigmata of bleeding was found in the duodenal bulb.      The second portion of the duodenum was normal.      There was no active bleeding or stigmata of recent bleeding seen during       today's EGD. Impression:               - Esophageal plaques were found, consistent with                            candidiasis.                           - Normal gastric body, antrum, cardia and gastric  fundus.                           - Small hiatal hernia.                           - Erythematous duodenopathy.                           - Normal second portion of the duodenum.                           - There was no active bleeding or stigmata of                            recent bleeding seen on EGD during today's exam. It                            is possible that the patient had a Mallory-Weiss                            tear that previously blood contributing to                            coffee-ground emesis now resolved. No pathologic                            lesions seen to account for patient's decline in                            hemoglobin. Recommendation:            - Return patient to hospital ward for ongoing care.                           - Consider treatment of Candida esophagitis with                            Diflucan  200 mg po x 1 , then fluconazole  100 mg                            p.o. daily for total of 14 days                           - In the absence of active GI bleeding can                            discontinue IV PPI twice daily. With finding of                            mild duodenitis can transition to pantoprazole  40                            mg orally daily x 30 days.                           -  Findings discussed with patient Procedure Code(s):        --- Professional ---                           [phone], Esophagogastroduodenoscopy, flexible,                            transoral; diagnostic, including collection of                            specimen(s) by brushing or washing, when performed                            (separate procedure) Diagnosis Code(s):        --- Professional ---                           K22.9, Disease of esophagus, unspecified                           K44.9, Diaphragmatic hernia without obstruction or                            gangrene                           K31.89, Other diseases of stomach and duodenum                           K92.0, Hematemesis CPT copyright 2022 American Medical Association. All rights reserved. The codes documented in this report are preliminary and upon coder review may  be revised to meet current compliance requirements. Eugenia Hess, MD 07/09/2023 11:31:26 AM This report has been signed electronically. Number of Addenda: 0

## 2023-07-09 NOTE — Plan of Care (Signed)
  Problem: Education: Goal: Knowledge of General Education information will improve Description: Including pain rating scale, medication(s)/side effects and non-pharmacologic comfort measures Outcome: Progressing   Problem: Clinical Measurements: Goal: Ability to maintain clinical measurements within normal limits will improve Outcome: Progressing Goal: Will remain free from infection Outcome: Progressing Goal: Respiratory complications will improve Outcome: Progressing Goal: Cardiovascular complication will be avoided Outcome: Progressing   Problem: Nutrition: Goal: Adequate nutrition will be maintained Outcome: Progressing   Problem: Coping: Goal: Level of anxiety will decrease Outcome: Progressing   Problem: Elimination: Goal: Will not experience complications related to bowel motility Outcome: Progressing Goal: Will not experience complications related to urinary retention Outcome: Progressing   Problem: Pain Managment: Goal: General experience of comfort will improve and/or be controlled Outcome: Progressing   Problem: Skin Integrity: Goal: Risk for impaired skin integrity will decrease Outcome: Progressing

## 2023-07-09 NOTE — Progress Notes (Signed)
 VAD Coordinator Procedure Note:   VAD Coordinator met patient in Endoscopy. Pt undergoing EGD per Dr. Yvone Herd. Hemodynamics and VAD parameters monitored by myself and anesthesia.   Time: Doppler Auto  BP Flow PI Power Speed  Pre-procedure:  1043 92 99/75(82) 3.5 6.7 4.0 5400           Sedation Induction: 1107  104/76(86) 3.6 6.9 4.0 5400   1120  81/54(64) 4.2 4.7 4.1 5400           Recovery Area: 1132  86/63(71) 3.7 8.1 4.0 5400             Patient tolerated the procedure well. VAD Coordinator accompanied and remained with patient in recovery area and transported back to 2C16. Report given to [employer].  No acute source of bleeding found during procedure. MD reports possible he had Mallory-Weiss tear that resolved. Found to have esophageal plaques consistent with candidiasis and mild duodenitis. GI recommending Diflucan  200 mg po x 1 , then fluconazole  100 mg p.o. daily for total of 14 days and Pantoprazole  40 mg orally daily x 30 days.  Patient Disposition: 2C16   Rico Charters, BSN VAD Coordinator 24/7 Pager [phone]

## 2023-07-09 NOTE — Discharge Instructions (Addendum)
 Contact (HART) [employer]: Transportation Services  [address] [place], [place] [address] [phone] [email or web address]  Research scientist (physical sciences)  Our Transportation department provides a fleet of vehicles to transport senior citizens to and from [employer], [employer], [employer], and [employer].  We are the largest human-service provider within the [facility].  Every week our vehicles travel routes and work with Firefighter, Health Department and other facilities for the elderly. For example, in 2000 we traveled approximately 600,00 miles and delivered 125,000 meals to the congregate and homebound senior citizens.  Transportation is provided throughout the counties of: [place], [place], and [place].  We also transport as far as [place], [place]; [place], [place] and other locations.  We provide wheel chair accessible Medicaid Transportation to all medical facilities.  Please call [phone] to set up a screening appointment if you are interested in Medical Transportation.   Medicaid Transportation: Biomedical engineer provides a fleet of vehicles to transport senior citizens to and from [employer], [employer], [employer], and [employer].  We are the largest human-service provider within the [facility].  Every week our vehicles travel routes and work with Firefighter, Health Department and other facilities for the elderly. For example, in 2000 we traveled approximately 600,00 miles and delivered 125,000 meals to the congregate and homebound senior citizens.  Transportation is provided throughout the counties of: [place], [place], and [place].  We also transport as far as [place], [place]; [place], [place] and other locations.  We provide wheel chair accessible Medicaid  Transportation to all medical facilities.  Please call [phone] to set up a screening appointment if you are interested in Medical Transportation.   Medicaid Transportation: [phone]

## 2023-07-09 NOTE — Discharge Summary (Addendum)
 Advanced Heart Failure Team  Discharge Summary   Patient ID: Jon Martin MRN: [id number], DOB/AGE: 38/06/1985 37 y.o. Admit date: 07/04/2023 D/C date:     07/09/2023   Primary Discharge Diagnoses:  Upper GI bleed Intractable nausea and vomiting Lactic acidosis HM III VAD driveline infection  Secondary Discharge Diagnoses:  Chronic systolic CHF Hx substance abuse Hypokalemia/hypomagnesemia  Hospital Course:   [name] is a 38 y/o male from [place]  w/ h/o severe systolic HF due to NICM EF 15% and prior polysubstance abuse (cocaine and ETOH)   Underwent HM-3 LVAD placement on 02/26/19 by Dr. Eleno Griffins. Required milrinone  post op for RV support.   Admitted 12/21 for anemia in setting of supratherapeutic INR (8.0) and bleeding from a sore on his lip. Transfused but did not bleed any further. Developed refractory low flow alarms on VAD with drop in platelets and rising LDH. RUQ U/S was normal (doubt splenic sequestration). Peripheral smear showed no evidence of hemolysis. Patient placed on his left side and low flow alarms resolved. It was suspected possible obstruction/clot of outflow graft causing Plts and Hgb to drop. CT 12/10 showed no evidence of outflow graft kink/clot.    Admitted 3/22 for MSSA DL infection requiring surgical debridement. D/c'd to [facility] for IV abx.    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 Wound cx grew MSSA. Treated with cefazolin . Discharged home on 03/16/21 with wound vac. ID recommended oral linezolid  BID for 3 weeks at discharge then switch to chronic cefadroxil  afterwards.   Abscess at driveline site drained and cultured in clinic on 04/10. Culture grewn E Coli, Proteus and Entreococcus faecalis. Levaquin  added to chronic cefadroxil .  Patient presented to [facility] on 07/04/23 for episodes of coffee ground emesis after multiple vomiting episodes. He was transferred to [facility] for further workup. He was noted to have  severely elevated lactic acid and was hypovolemic. Treated with IV protonix  and given IVF resuscitation. Had intractable nausea and vomiting which improved with Reglan . Hgb trended down from 12 >> 8.5. GI consulted and underwent EGD which did not show a source of bleeding, possible he had Mallory-Weiss tear that resolved. Found to have esophageal plaques consistent with candidiasis and mild duodenitis. Prescribed course of fluconazole  and protonix  per GI recommendations.  Started on IV ampicillin  for driveline infection. Wound culture this admit with normal skin flora. He was seen by ID. It was recommended to continue cefadroxil  500 mg po BID for lifelong suppression at discharge.   Close follow-up with VAD clinic arranged.      Discharge Vitals: Blood pressure 101/71, pulse 91, temperature 98.8 F (37.1 C), temperature source Oral, resp. rate 13, height 5\' 5"  (1.651 m), weight 51.5 kg, SpO2 100%.  Labs: Lab Results  Component Value Date   WBC 9.1 07/09/2023   HGB 8.5 (L) 07/09/2023   HCT 24.3 (L) 07/09/2023   MCV 102.5 (H) 07/09/2023   PLT 68 (L) 07/09/2023    Recent Labs  Lab 07/04/23 1427 07/05/23 0415 07/08/23 1950 07/09/23 0335  NA 145   < > 134* 133*  K 4.5   < > 3.7 3.5  CL 103   < > 104 104  CO2 11*   < > 21* 22  BUN 16   < > 8 7  CREATININE 2.06*   < > 1.08 0.81  CALCIUM 9.5   < > 9.0 9.3  PROT 8.6*  --   --   --   BILITOT  2.1*  --  0.5  --   ALKPHOS 62  --   --   --   ALT 34  --   --   --   AST 73*  --   --   --   GLUCOSE 151*   < > 143* 123*   < > = values in this interval not displayed.   Lab Results  Component Value Date   CHOL 89 02/11/2019   HDL 25 (L) 02/11/2019   LDLCALC 52 02/11/2019   TRIG 58 02/11/2019   BNP (last 3 results) No results for input(s): "BNP" in the last 8760 hours.  ProBNP (last 3 results) No results for input(s): "PROBNP" in the last 8760 hours.   Diagnostic Studies/Procedures   EGD 07/08/23: Impression - Esophageal plaques  were found, consistent with candidiasis.  - Normal gastric body, antrum, cardia and gastric fundus.  - Small hiatal hernia.  - Erythematous duodenopathy.  - Normal second portion of the duodenum.  - There was no active bleeding or stigmata of recent bleeding seen on EGD during today's exam. It is possible that the patient had a Mallory-Weiss tear that previously blood contributing to coffee-ground emesis now resolved. No pathologic lesions seen to account for patient's decline in hemoglobin.  Recommendation: - Return patient to hospital ward for ongoing care.  - Consider treatment of Candida esophagitis with Diflucan  200 mg po x 1 , then fluconazole  100 mg p.o. daily for total of 14 days  - In the absence of active GI bleeding can discontinue IV PPI twice daily. With finding of mild duodenitis can transition to pantoprazole  40 mg orally daily x 30 days.  - Findings discussed with patient  Discharge Medications   Allergies as of 07/09/2023   No Known Allergies      Medication List     TAKE these medications    cefadroxil  500 MG capsule Commonly known as: DURICEF Take 1 capsule (500 mg total) by mouth 2 (two) times daily. Start taking on: July 10, 2023   fluconazole  200 MG tablet Commonly known as: DIFLUCAN  Take 1 tablet (200 mg total) by mouth daily. Start taking on: July 10, 2023   Klor-Con  M20 20 MEQ tablet Generic drug: potassium chloride  SA TAKE 2 TABLETS (40 MEQ) BY MOUTH DAILY, OR AS DIRECTED BY HEART FAILURE CLINIC   pantoprazole  40 MG tablet Commonly known as: PROTONIX  Take 1 tablet (40 mg total) by mouth daily. Start taking on: July 10, 2023        Disposition   The patient will be discharged in stable condition to home. Discharge Instructions     Increase activity slowly   Complete by: As directed    Page VAD Coordinator at [phone]  Notify for: any VAD alarms, sustained elevations of power >10 watts, sustained drop in Pulse Index <3   Complete by: As  directed    Notify for:  any VAD alarms sustained elevations of power >10 watts sustained drop in Pulse Index <3            Duration of Discharge Encounter: Greater than 35 minutes   Signed, [facility], LINDSAY N  07/09/2023, 1:51 PM   Patient seen with PA, agree with the above note.   Please see my separate noted detailing discharge planning.   Peder Bourdon 07/09/2023

## 2023-07-09 NOTE — TOC Progression Note (Signed)
 Transition of Care [facility]) - Progression Note    Patient Details  Name: Jon Martin MRN: [id number] Date of Birth: 10-24-1985  Transition of Care [facility]) CM/SW Contact  Juliane Och, LCSW Phone Number: 07/09/2023, 1:58 PM  Clinical Narrative:     1:58 PM Per Oncology RN request, CSW provided transportation resources.   Expected Discharge Plan: Home/Self Care Barriers to Discharge: Continued Medical Work up  Expected Discharge Plan and Services   Discharge Planning Services: CM Consult   Living arrangements for the past 2 months: Single Family Home Expected Discharge Date: 07/09/23                                     Social Determinants of Health (SDOH) Interventions SDOH Screenings   Food Insecurity: No Food Insecurity (07/06/2023)  Housing: Low Risk  (07/06/2023)  Transportation Needs: No Transportation Needs (07/06/2023)  Utilities: Not At Risk (07/06/2023)  Alcohol Screen: Medium Risk (03/05/2019)  Depression (PHQ2-9): Low Risk  (02/22/2019)  Financial Resource Strain: High Risk (07/04/2022)  Physical Activity: Sufficiently Active (03/05/2019)  Social Connections: Unknown (07/06/2023)  Tobacco Use: Medium Risk (07/06/2023)    Readmission Risk Interventions     No data to display

## 2023-07-09 NOTE — TOC Transition Note (Signed)
 Transition of Care [facility]) - Discharge Note   Patient Details  Name: Jon Martin MRN: [id number] Date of Birth: March 27, 1985  Transition of Care [facility]) CM/SW Contact:  Jeani Mill, RN Phone Number: 07/09/2023, 3:43 PM   Clinical Narrative:    Patient stable for discharge.  Taxi voucher given -approved by Hillary Lowing.  Ride wavier signed and placed in hard chart. Patient to make follow up apt with PCP.     Final next level of care: Home/Self Care Barriers to Discharge: Barriers Resolved   Patient Goals and CMS Choice Patient states their goals for this hospitalization and ongoing recovery are:: wants to remain independent          Discharge Placement               Home        Discharge Plan and Services Additional resources added to the After Visit Summary for     Discharge Planning Services: CM Consult                                 Social Drivers of Health (SDOH) Interventions SDOH Screenings   Food Insecurity: No Food Insecurity (07/06/2023)  Housing: Low Risk  (07/06/2023)  Transportation Needs: No Transportation Needs (07/06/2023)  Utilities: Not At Risk (07/06/2023)  Alcohol Screen: Medium Risk (03/05/2019)  Depression (PHQ2-9): Low Risk  (02/22/2019)  Financial Resource Strain: High Risk (07/04/2022)  Physical Activity: Sufficiently Active (03/05/2019)  Social Connections: Unknown (07/06/2023)  Tobacco Use: Medium Risk (07/06/2023)     Readmission Risk Interventions    07/09/2023    3:43 PM  Readmission Risk Prevention Plan  Post Dischage Appt Complete  Medication Screening Complete  Transportation Screening Complete

## 2023-07-10 ENCOUNTER — Encounter (HOSPITAL_COMMUNITY): Payer: Self-pay | Admitting: Pediatrics

## 2023-07-10 LAB — HAPTOGLOBIN: Haptoglobin: 168 mg/dL (ref 17–317)

## 2023-07-15 NOTE — Anesthesia Preprocedure Evaluation (Addendum)
 Anesthesia Evaluation  Patient identified by MRN, date of birth, ID band Patient awake    Reviewed: Allergy & Precautions, NPO status , Patient's Chart, lab work & pertinent test results  History of Anesthesia Complications Negative for: history of anesthetic complications  Airway Mallampati: III  TM Distance: >3 FB Neck ROM: Full    Dental  (+) Dental Advisory Given   Pulmonary neg COPD, neg recent URI, Patient abstained from smoking., former smoker   breath sounds clear to auscultation       Cardiovascular +CHF   Rhythm:Regular     Neuro/Psych negative neurological ROS     GI/Hepatic   Endo/Other  negative endocrine ROS    Renal/GU Renal disease     Musculoskeletal   Abdominal   Peds  Hematology   Anesthesia Other Findings   Reproductive/Obstetrics                             Anesthesia Physical Anesthesia Plan  ASA: 2  Anesthesia Plan: MAC   Post-op Pain Management: Minimal or no pain anticipated   Induction: Intravenous  PONV Risk Score and Plan: 1 and Propofol  infusion  Airway Management Planned: Nasal Cannula, Natural Airway and Simple Face Mask  Additional Equipment: None  Intra-op Plan:   Post-operative Plan:   Informed Consent: I have reviewed the patients History and Physical, chart, labs and discussed the procedure including the risks, benefits and alternatives for the proposed anesthesia with the patient or authorized representative who has indicated his/her understanding and acceptance.     Dental advisory given  Plan Discussed with: CRNA  Anesthesia Plan Comments:        Anesthesia Quick Evaluation

## 2023-07-15 NOTE — Anesthesia Postprocedure Evaluation (Signed)
 Anesthesia Post Note  Patient: Jon Martin  Procedure(s) Performed: EGD (ESOPHAGOGASTRODUODENOSCOPY)     Patient location during evaluation: Endoscopy Anesthesia Type: MAC Level of consciousness: awake and alert Pain management: pain level controlled Vital Signs Assessment: post-procedure vital signs reviewed and stable Respiratory status: spontaneous breathing, nonlabored ventilation and respiratory function stable Cardiovascular status: stable and blood pressure returned to baseline Postop Assessment: no apparent nausea or vomiting Anesthetic complications: no   There were no known notable events for this encounter.                Aliviah Spain

## 2023-07-30 ENCOUNTER — Other Ambulatory Visit (HOSPITAL_COMMUNITY): Payer: Self-pay

## 2023-07-30 ENCOUNTER — Telehealth: Payer: Self-pay

## 2023-07-30 DIAGNOSIS — Z95811 Presence of heart assist device: Secondary | ICD-10-CM

## 2023-07-30 DIAGNOSIS — Z7901 Long term (current) use of anticoagulants: Secondary | ICD-10-CM
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
## 2023-07-31 ENCOUNTER — Encounter (HOSPITAL_COMMUNITY): Admitting: Internal Medicine

## 2023-07-31 ENCOUNTER — Encounter (HOSPITAL_COMMUNITY): Payer: Self-pay

## 2023-08-01 ENCOUNTER — Telehealth (HOSPITAL_COMMUNITY): Payer: Self-pay
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
## 2023-08-26 ENCOUNTER — Encounter (HOSPITAL_COMMUNITY): Payer: Self-pay | Admitting: *Deleted

## 2023-08-26 ENCOUNTER — Telehealth (HOSPITAL_COMMUNITY): Payer: Self-pay | Admitting: *Deleted

## 2023-08-26 NOTE — Telephone Encounter (Signed)
 Attempted to reach pt again today regarding need to schedule follow up appt. He did not answer, and his voicemail is not set up. Letter sent to his MyChart, and letter mailed to his home.   Isaiah Knoll RN VAD Coordinator  Office: [phone]  24/7 Pager: [phone]

## 2023-09-01 ENCOUNTER — Ambulatory Visit (HOSPITAL_COMMUNITY)

## 2023-09-01 ENCOUNTER — Encounter (HOSPITAL_COMMUNITY): Payer: Self-pay

## 2023-09-01 ENCOUNTER — Inpatient Hospital Stay (HOSPITAL_COMMUNITY)

## 2023-09-01 ENCOUNTER — Inpatient Hospital Stay (HOSPITAL_COMMUNITY)
Admission: EM | Admit: 2023-09-01 | Discharge: 2023-09-05 | DRG: 871 | Disposition: A | Discharge status: Home / Self Care | Source: Other Acute Inpatient Hospital | Attending: Cardiology | Admitting: Cardiology

## 2023-09-01 ENCOUNTER — Telehealth (HOSPITAL_COMMUNITY): Payer: Self-pay | Admitting: *Deleted

## 2023-09-01 DIAGNOSIS — E860 Dehydration: Secondary | ICD-10-CM | POA: Diagnosis present

## 2023-09-01 DIAGNOSIS — K859 Acute pancreatitis without necrosis or infection, unspecified: Secondary | ICD-10-CM | POA: Diagnosis present

## 2023-09-01 DIAGNOSIS — R55 Syncope and collapse: Principal | ICD-10-CM | POA: Diagnosis present

## 2023-09-01 DIAGNOSIS — Z681 Body mass index (BMI) 19 or less, adult: Secondary | ICD-10-CM

## 2023-09-01 DIAGNOSIS — R5381 Other malaise: Secondary | ICD-10-CM | POA: Diagnosis present

## 2023-09-01 DIAGNOSIS — I5023 Acute on chronic systolic (congestive) heart failure: Secondary | ICD-10-CM

## 2023-09-01 DIAGNOSIS — I110 Hypertensive heart disease with heart failure: Secondary | ICD-10-CM | POA: Diagnosis present

## 2023-09-01 DIAGNOSIS — F1410 Cocaine abuse, uncomplicated: Secondary | ICD-10-CM | POA: Diagnosis present

## 2023-09-01 DIAGNOSIS — F17290 Nicotine dependence, other tobacco product, uncomplicated: Secondary | ICD-10-CM | POA: Diagnosis present

## 2023-09-01 DIAGNOSIS — I959 Hypotension, unspecified: Secondary | ICD-10-CM | POA: Diagnosis present

## 2023-09-01 DIAGNOSIS — I429 Cardiomyopathy, unspecified: Secondary | ICD-10-CM | POA: Diagnosis not present

## 2023-09-01 DIAGNOSIS — B9561 Methicillin susceptible Staphylococcus aureus infection as the cause of diseases classified elsewhere: Secondary | ICD-10-CM | POA: Diagnosis present

## 2023-09-01 DIAGNOSIS — E440 Moderate protein-calorie malnutrition: Secondary | ICD-10-CM | POA: Diagnosis present

## 2023-09-01 DIAGNOSIS — I420 Dilated cardiomyopathy: Secondary | ICD-10-CM | POA: Diagnosis present

## 2023-09-01 DIAGNOSIS — T827XXA Infection and inflammatory reaction due to other cardiac and vascular devices, implants and grafts, initial encounter: Secondary | ICD-10-CM | POA: Diagnosis present

## 2023-09-01 DIAGNOSIS — Z79899 Other long term (current) drug therapy: Secondary | ICD-10-CM

## 2023-09-01 DIAGNOSIS — Z7901 Long term (current) use of anticoagulants: Secondary | ICD-10-CM | POA: Diagnosis not present

## 2023-09-01 DIAGNOSIS — D696 Thrombocytopenia, unspecified: Secondary | ICD-10-CM | POA: Diagnosis present

## 2023-09-01 DIAGNOSIS — K920 Hematemesis: Secondary | ICD-10-CM | POA: Diagnosis present

## 2023-09-01 DIAGNOSIS — Y831 Surgical operation with implant of artificial internal device as the cause of abnormal reaction of the patient, or of later complication, without mention of misadventure at the time of the procedure: Secondary | ICD-10-CM | POA: Diagnosis present

## 2023-09-01 DIAGNOSIS — I5022 Chronic systolic (congestive) heart failure: Secondary | ICD-10-CM | POA: Diagnosis not present

## 2023-09-01 DIAGNOSIS — Z8249 Family history of ischemic heart disease and other diseases of the circulatory system: Secondary | ICD-10-CM | POA: Diagnosis not present

## 2023-09-01 DIAGNOSIS — Z5986 Financial insecurity: Secondary | ICD-10-CM

## 2023-09-01 DIAGNOSIS — N179 Acute kidney failure, unspecified: Secondary | ICD-10-CM | POA: Diagnosis present

## 2023-09-01 DIAGNOSIS — K828 Other specified diseases of gallbladder: Secondary | ICD-10-CM | POA: Diagnosis present

## 2023-09-01 DIAGNOSIS — K852 Alcohol induced acute pancreatitis without necrosis or infection: Secondary | ICD-10-CM | POA: Diagnosis not present

## 2023-09-01 DIAGNOSIS — F1010 Alcohol abuse, uncomplicated: Secondary | ICD-10-CM | POA: Diagnosis present

## 2023-09-01 DIAGNOSIS — A419 Sepsis, unspecified organism: Secondary | ICD-10-CM | POA: Diagnosis present

## 2023-09-01 DIAGNOSIS — Z95811 Presence of heart assist device: Secondary | ICD-10-CM | POA: Diagnosis not present

## 2023-09-01 DIAGNOSIS — K8500 Idiopathic acute pancreatitis without necrosis or infection: Secondary | ICD-10-CM | POA: Diagnosis not present

## 2023-09-01 LAB — COMPREHENSIVE METABOLIC PANEL WITH GFR
ALT: 41 U/L (ref 0–44)
AST: 75 U/L — ABNORMAL HIGH (ref 15–41)
Albumin: 3.9 g/dL (ref 3.5–5.0)
Alkaline Phosphatase: 65 U/L (ref 38–126)
Anion gap: 25 — ABNORMAL HIGH (ref 5–15)
BUN: 26 mg/dL — ABNORMAL HIGH (ref 6–20)
CO2: 14 mmol/L — ABNORMAL LOW (ref 22–32)
Calcium: 8 mg/dL — ABNORMAL LOW (ref 8.9–10.3)
Chloride: 107 mmol/L (ref 98–111)
Creatinine, Ser: 2.86 mg/dL — ABNORMAL HIGH (ref 0.61–1.24)
GFR, Estimated: 28 mL/min — ABNORMAL LOW (ref >60)
Glucose, Bld: 133 mg/dL — ABNORMAL HIGH (ref 70–99)
Potassium: 4.3 mmol/L (ref 3.5–5.1)
Sodium: 146 mmol/L — ABNORMAL HIGH (ref 135–145)
Total Bilirubin: 2.6 mg/dL — ABNORMAL HIGH (ref 0.0–1.2)
Total Protein: 7.4 g/dL (ref 6.5–8.1)

## 2023-09-01 LAB — LACTIC ACID, PLASMA: Lactic Acid, Venous: 1.4 mmol/L (ref 0.5–1.9)

## 2023-09-01 LAB — CBC WITH DIFFERENTIAL/PLATELET
Abs Immature Granulocytes: 0.06 K/uL (ref 0.00–0.07)
Basophils Absolute: 0 K/uL (ref 0.0–0.1)
Basophils Relative: 0 %
Eosinophils Absolute: 0 K/uL (ref 0.0–0.5)
Eosinophils Relative: 0 %
HCT: 34.4 % — ABNORMAL LOW (ref 39.0–52.0)
Hemoglobin: 11.5 g/dL — ABNORMAL LOW (ref 13.0–17.0)
Immature Granulocytes: 0 %
Lymphocytes Relative: 5 %
Lymphs Abs: 0.8 K/uL (ref 0.7–4.0)
MCH: 34.6 pg — ABNORMAL HIGH (ref 26.0–34.0)
MCHC: 33.4 g/dL (ref 30.0–36.0)
MCV: 103.6 fL — ABNORMAL HIGH (ref 80.0–100.0)
Monocytes Absolute: 1.4 K/uL — ABNORMAL HIGH (ref 0.1–1.0)
Monocytes Relative: 9 %
Neutro Abs: 13.6 K/uL — ABNORMAL HIGH (ref 1.7–7.7)
Neutrophils Relative %: 86 %
Platelets: 111 K/uL — ABNORMAL LOW (ref 150–400)
RBC: 3.32 MIL/uL — ABNORMAL LOW (ref 4.22–5.81)
RDW: 16.6 % — ABNORMAL HIGH (ref 11.5–15.5)
WBC: 15.8 K/uL — ABNORMAL HIGH (ref 4.0–10.5)
nRBC: 0 % (ref 0.0–0.2)

## 2023-09-01 LAB — PROCALCITONIN: Procalcitonin: 0.5 ng/mL

## 2023-09-01 LAB — MRSA NEXT GEN BY PCR, NASAL: MRSA by PCR Next Gen: NOT DETECTED

## 2023-09-01 LAB — MAGNESIUM: Magnesium: 1.9 mg/dL (ref 1.7–2.4)

## 2023-09-01 LAB — LACTATE DEHYDROGENASE: LDH: 242 U/L — ABNORMAL HIGH (ref 98–192)

## 2023-09-01 MED ORDER — IOHEXOL 9 MG/ML PO SOLN
500.0000 mL | ORAL | Status: AC
  Administered 2023-09-01 (×2): 500 mL via ORAL

## 2023-09-01 MED ORDER — CHLORHEXIDINE GLUCONATE CLOTH 2 % EX PADS
6.0000 | MEDICATED_PAD | Freq: Every day | CUTANEOUS | Status: DC
  Administered 2023-09-01 – 2023-09-05 (×5): 6 via TOPICAL

## 2023-09-01 MED ORDER — SODIUM CHLORIDE 0.9 % IV SOLN
2.0000 g | INTRAVENOUS | Status: DC
  Administered 2023-09-02: 2 g via INTRAVENOUS
  Filled 2023-09-01: qty 12.5

## 2023-09-01 MED ORDER — LACTATED RINGERS IV SOLN: INTRAVENOUS | Status: AC

## 2023-09-01 MED ORDER — ONDANSETRON HCL 4 MG/2ML IJ SOLN
4.0000 mg | Freq: Four times a day (QID) | INTRAMUSCULAR | Status: DC | PRN
  Administered 2023-09-01: 4 mg via INTRAVENOUS
  Filled 2023-09-01: qty 2

## 2023-09-01 MED ORDER — ACETAMINOPHEN 325 MG PO TABS: 650.0000 mg | ORAL_TABLET | ORAL | Status: DC | PRN

## 2023-09-01 MED ORDER — ORAL CARE MOUTH RINSE: 15.0000 mL | OROMUCOSAL | Status: DC | PRN

## 2023-09-01 NOTE — Progress Notes (Signed)
 Patient arrived from [facility] ER as a LVAD transfer.  He had a syncopal episode this morning after several episodes of hematemesis.  White blood cell count on arrival was 19.  Hemoglobin 12.5.  Heart rate 160.  Systolic blood pressure in the 80s.  proBNP 3800.  Patient also had AKI with a creatinine of 3.1, baseline creatinine 0.8.  Patient received a dose of vancomycin  and cefepime  along with IV fluid prior to transfer.  On arrival, patient is alert and oriented.  He he admits to have hematemesis today, denies any hematemesis prior to today.  He is clinically stable.  Dr. Rolan notified of the patient's arrival.  Lab work pending.  Antibiotic will be resumed after getting blood culture.  However patient did receive vancomycin  and cefepime  prior to the transfer, therefore likely will also need to follow-up on the blood culture that was drawn at the [facility] ER  Signed, Scot Ford PA Pager: [phone]

## 2023-09-01 NOTE — ED Notes (Signed)
 Two failed IV attempts: L AC and R AC

## 2023-09-01 NOTE — Progress Notes (Signed)
 Pharmacy Antibiotic Note  Jon Martin is a 38 y.o. 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  Pharmacy has been consulted for vancomycin  and cefepime  dosing.  Plan: Patient received a loading dose of vancomycin  1250mg  at 1400 on 7/28 from an outside hospital prior to arrival. Patient's Scr at outside facility was reported as 3.1 today (baseline appears to be around 0.8s-1.0s). BMET is currently pending. Based on renal function, we will collect a random vancomycin  level 24 hours from the loading dose to help determine the best course of action for dosing in this patient.  Cefepime  2g x1 dose was given at 1400 at the outside hospital prior to arrival. Based on indication and current renal function, will cautiously start cefepime  2g every 24 hours and adjust as needed based on changes in renal function and blood cultures.  Labs this admission are pending.  No results for input(s): WBC, CREATININE, LATICACIDVEN, VANCOTROUGH, VANCOPEAK, VANCORANDOM, GENTTROUGH, GENTPEAK, GENTRANDOM, TOBRATROUGH, TOBRAPEAK, TOBRARND, AMIKACINPEAK, AMIKACINTROU, AMIKACIN in the last 168 hours.   From outside facility: WBC: 19.3 Creatinine: 3.1 Lactic acid: 2.7  CrCl is estimated to be 23.7 ml/min based on Scr of 3.1 and weight of 51.3 kg recorded at outside facility.  No Known Allergies  Antimicrobials this admission: Vancomycin  7/28 >>  Cefepime  2g 7/28 >>   Microbiology results: 7/28 BCx: 4/4 pending, also collected at [facility] prior to transfer 7/28 MRSA PCR: pending  Thank you for allowing pharmacy to be involved with this patient's care.  Mendel Barter, PharmD PGY1 Clinical Pharmacist [facility]  09/01/2023 8:07 PM

## 2023-09-01 NOTE — Unmapped External Note (Signed)
 Critical Value/Critical Test Notification   Received at: 1316 Patient identified: Name: Jon Martin: MRN: [id number]    Critical result reported: CO2 less than 8 Critical result reported by: Lab  Critical result read back/confirmed: YES Critical result received by: Glade [name], RN  Critical result treated per protocol/parameter orders: Yes  MD/Licensed Caregiver notified of critical result: Lang MD notified at: 8682 MD/Licensed Caregiver read back/confirmed: YES

## 2023-09-01 NOTE — Telephone Encounter (Signed)
 Received call from [facility] ER- Dr Waddell Bush pt presented to ER following syncopal episode this morning. Pt reports several episodes of hematemesis at home. WBC 19 Hgb 12.5, drug screen pending. HR 160 BP 88/60 (69). VAD numbers stable. No alarms noted. Receiving Vanc/Cefepime  and bolus IVF.   Discussed the above with Dr Cherrie and Dr Rolan. Plan to transfer/admit pt to 2H. Discussed pending admission with 2H charge RN, and made rounding team aware.   Discussed need for transfer to [facility] with Dr Lang [phone]. He will call transfer center and initiate transfer. Advised that pt needs black bag with 2 extra fully charged batteries for transport. They will contact pt's family to bring to ER as pt did not bring. Provided with VAD pager # [phone] for further pt needs.   Isaiah Knoll RN VAD Coordinator  Office: [phone]  24/7 Pager: [phone]
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
## 2023-09-01 NOTE — H&P (Signed)
 Advanced Heart Failure Team History and Physical Note   PCP:  Merrell Delanna BROCKS, MD  PCP-Cardiology: None    Reason for Admission: Syncope HPI:    38 y.o. male with severe HFrEF d/t NICM s/p HM3 LVAD implantation and polysubstance abuse (cocaine and ETOH).   Underwent HM-3 LVAD placement on 02/26/19 by Dr. Fleeta Brooke. Required milrinone  post op for RV support.   Admitted 6/21 for AKI and hypotension in setting of volume depletion due to GI bug. Had multiple flow alarms. Echo EF 40-45%    Admitted 12/21 for ABLA in setting of supratherapeutic INR (8.0) and bleeding from a sore on his lip. Developed refractory low flow alarms on VAD with drop in platelets and rising LDH. RUQ U/S was normal (doubt splenic sequestration). Peripheral smear showed no evidence of hemolysis.Patient placed on his left side and low flow alarms resolved. It was suspected possible obstruction/clot of outflow graft causing Plts and Hgb to drop. CT 12/10 showed no evidence of outflow graft kink/clot. LDH trended back down to his baseline, below 200s. Plts started to trend back up.   Admitted 3/22 for MSSA DL infection requiring surgical debridement. D/c'd to [facility] for IV abx.    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 Wound cx grew MSSA. Treated with cefazolin . Discharged home on 03/16/21 with wound vac. ID recommended oral linezolid  BID for 3 weeks at discharge then switch to chronic cefadroxil  afterwards.   Seen by ID 5/25. Wound culture + for few E. coli, P. milrabilis, and E. Faecalis. He was continued on Cefadroxil  and switched from Cipro  to Levaquin  750 mg daily.   He was admitted again 5/25-6/25 with coffee grounds emesis and hypovolemia, UDS +cocaine.  Possible Mallory-Weiss tear.  EGD showed esophageal candidiasis, treated with Difucan.  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    Patient reports nausea/vomiting and pain at his driveline site starting yesterday.  Poor po intake since yesterday.  Patient presented to [facility] ED in [place] 09/01/23 with syncope after several episodes of hematemesis at home. HR 160, BP 80/60s with stable VAD. WBC 19 and Hgb 12.5. Creatinine up to 3.1 from prior 0.81.He was started on IV vanc/cefepime  and IVF. He was then transferred to [facility] for further management.  MAP was 75 on arrival with HR 100s, NSR.  He is awake and alert, reports some ongoing nausea but no vomiting.   LVAD Interrogation HM 3: Speed: 5400 Flow: 4.5 L/min PI: 4.2 Power: 4.0. Multiple PI events. No low flow alarms.    Home Medications Prior to Admission medications   Medication Sig Start Date End Date Taking? Authorizing Provider  cefadroxil  (DURICEF) 500 MG capsule Take 1 capsule (500 mg total) by mouth 2 (two) times daily. 07/10/23   Colletta Manuelita Garre, PA-C  fluconazole  (DIFLUCAN ) 200 MG tablet Take 1 tablet (200 mg total) by mouth daily. 07/10/23   Rolan Ezra RAMAN, MD  KLOR-CON  M20 20 MEQ tablet TAKE 2 TABLETS (40 MEQ) BY MOUTH DAILY, OR AS DIRECTED BY HEART FAILURE CLINIC 09/12/22   Bensimhon, Toribio SAUNDERS, MD  pantoprazole  (PROTONIX ) 40 MG tablet Take 1 tablet (40 mg total) by mouth daily. 07/10/23   Colletta Manuelita Garre, PA-C    Past Medical History: Past Medical History:  Diagnosis Date   Dilated cardiomyopathy [facility])    Dilated cardiomyopathy (HCC) 02/2019   Polysubstance abuse [facility])     Past Surgical  History: Past Surgical History:  Procedure Laterality Date   APPLICATION OF A-CELL OF CHEST/ABDOMEN  03/06/2021   Procedure: APPLICATION OF A-CELL OF CHEST/ABDOMEN;  Surgeon: Obadiah Coy, MD;  Location: [facility] OR;  Service: Thoracic;;   APPLICATION OF WOUND VAC Left 05/16/2020   Procedure: APPLICATION OF WOUND VAC;  Surgeon: Lucas Dorise POUR, MD;  Location: [facility] OR;  Service: Vascular;  Laterality: Left;   APPLICATION OF WOUND VAC N/A 03/02/2021   Procedure: APPLICATION  OF WOUND VAC;  Surgeon: Obadiah Coy, MD;  Location: [facility] OR;  Service: Thoracic;  Laterality: N/A;   APPLICATION OF WOUND VAC N/A 03/06/2021   Procedure: APPLICATION OF WOUND VAC;  Surgeon: Obadiah Coy, MD;  Location: [facility] OR;  Service: Thoracic;  Laterality: N/A;   APPLICATION OF WOUND VAC N/A 03/12/2021   Procedure: WOUND VAC CHANGE;  Surgeon: Obadiah Coy, MD;  Location: [facility] OR;  Service: Thoracic;  Laterality: N/A;   ESOPHAGOGASTRODUODENOSCOPY N/A 07/09/2023   Procedure: EGD (ESOPHAGOGASTRODUODENOSCOPY);  Surgeon: Suzann Inocente HERO, MD;  Location: [facility] ENDOSCOPY;  Service: Gastroenterology;  Laterality: N/A;   INCISION AND DRAINAGE OF WOUND Left 05/16/2020   Procedure: IRRIGATION AND DEBRIDEMENT VAD DRIVELINE WOUND;  Surgeon: Lucas Dorise POUR, MD;  Location: [facility] OR;  Service: Vascular;  Laterality: Left;   INSERTION OF IMPLANTABLE LEFT VENTRICULAR ASSIST DEVICE N/A 02/26/2019   Procedure: INSERTION OF IMPLANTABLE LEFT VENTRICULAR ASSIST DEVICE;  Surgeon: Fleeta Hanford Coy, MD;  Location: [facility] OR;  Service: Open Heart Surgery;  Laterality: N/A;  HM3   IR THORACENTESIS ASP PLEURAL SPACE W/IMG GUIDE  03/10/2019   MULTIPLE EXTRACTIONS WITH ALVEOLOPLASTY N/A 05/04/2020   Procedure: MULTIPLE EXTRACTION TEETH FIVE AND FIFTEEN WITH ALVEOLOPLASTY;  Surgeon: Celena Lum NOVAK, DMD;  Location: [facility] OR;  Service: Dentistry;  Laterality: N/A;   RIGHT HEART CATH N/A 02/25/2019   Procedure: RIGHT HEART CATH;  Surgeon: Cherrie Toribio SAUNDERS, MD;  [facility]: [facility] INVASIVE CV LAB;  Service: Cardiovascular;  Laterality: N/A;   RIGHT/LEFT HEART CATH AND CORONARY ANGIOGRAPHY N/A 02/11/2019   Procedure: RIGHT/LEFT HEART CATH AND CORONARY ANGIOGRAPHY;  Surgeon: Cherrie Toribio SAUNDERS, MD;  [facility] INVASIVE CV LAB;  Service: Cardiovascular;  Laterality: N/A;   STERNAL WOUND DEBRIDEMENT N/A 03/02/2021   Procedure: VAD DRIVELINE WOUND DEBRIDEMENT;  Surgeon: Obadiah Coy, MD;  Location[facility] OR;  Service: Thoracic;  Laterality: N/A;    STERNAL WOUND DEBRIDEMENT N/A 03/06/2021   Procedure: VAD DRIVELINE WOUND DEBRIDEMENT;  Surgeon: Obadiah Coy, MD;  [facility];  Service: Thoracic;  Laterality: N/A;   STERNAL WOUND DEBRIDEMENT N/A 03/12/2021   Procedure: VAD DRIVELINE WOUND DEBRIDEMENT;  Surgeon: Obadiah Coy, MD;  [facility] OR;  Service: Thoracic;  Laterality: N/A;   TEE WITHOUT CARDIOVERSION N/A 02/26/2019   Procedure: TRANSESOPHAGEAL ECHOCARDIOGRAM (TEE);  Surgeon: Fleeta Hanford, Coy, MD;  Location: [facility] OR;  Service: Open Heart Surgery;  Laterality: N/A;   VIDEO BRONCHOSCOPY N/A 02/26/2019   Procedure: Video Bronchoscopy;  Surgeon: Fleeta Hanford Coy, MD;  Location: [facility] OR;  Service: Open Heart Surgery;  Laterality: N/A;   Family History:  Family History  Problem Relation Age of Onset   CAD Father    Social History: Social History   Socioeconomic History   Marital status: Single    Spouse name: Not on file   Number of children: Not on file   Years of education: Not on file   Highest education level: Not on file  Occupational History   Not on file  Tobacco Use   Smoking  status: Former    Types: Cigarettes   Smokeless tobacco: Never  Vaping Use   Vaping status: Some Days  Substance and Sexual Activity   Alcohol use: Yes   Drug use: Not Currently    Types: Marijuana    Comment: Hx of cocaine use, smoke legal marijuana occasionally - last time [id number]   Sexual activity: Not on file  Other Topics Concern   Not on file  Social History Narrative   Not on file   Social Drivers of Health   Financial Resource Strain: High Risk (07/04/2022)   Overall Financial Resource Strain (CARDIA)    Difficulty of Paying Living Expenses: Hard  Food Insecurity: No Food Insecurity (07/06/2023)   Hunger Vital Sign    Worried About Running Out of Food in the Last Year: Never true    Ran Out of Food in the Last Year: Never true  Transportation Needs: No Transportation Needs (07/06/2023)   PRAPARE - Scientist, research (physical sciences) (Medical): No    Lack of Transportation (Non-Medical): No  Physical Activity: Sufficiently Active (03/05/2019)   Exercise Vital Sign    Days of Exercise per Week: 7 days    Minutes of Exercise per Session: 60 min  Stress: Not on file  Social Connections: Unknown (07/06/2023)   Social Connection and Isolation Panel    Frequency of Communication with Friends and Family: Twice a week    Frequency of Social Gatherings with Friends and Family: More than three times a week    Attends Religious Services: Patient declined    Database administrator or Organizations: Patient declined    Attends Banker Meetings: Patient declined    Marital Status: Patient declined   Allergies:  No Known Allergies  Objective:    Vital Signs:    MAP 75, HR 100s NSR  Physical Exam     General: Well appearing this am. NAD.  HEENT: Normal. Neck: Supple, JVP 7-8 cm. Carotids OK.  Cardiac:  Mechanical heart sounds with LVAD hum present.  Lungs:  CTAB, normal effort.  Abdomen:  NT, ND, no HSM. No bruits or masses. +BS  LVAD exit site: Site is dressed, some tenderness to palpation.  Extremities:  Warm and dry. No cyanosis, clubbing, rash, or edema.  Neuro:  Alert & oriented x 3. Cranial nerves grossly intact. Moves all 4 extremities w/o difficulty. Affect pleasant    Telemetry   NSR 100s (personally reviewed)  EKG   Pending  Labs    Basic Metabolic Panel: No results for input(s): NA, K, CL, CO2, GLUCOSE, BUN, CREATININE, CALCIUM, MG, PHOS in the last 168 hours.  Liver Function Tests: No results for input(s): AST, ALT, ALKPHOS, BILITOT, PROT, ALBUMIN  in the last 168 hours. No results for input(s): LIPASE, AMYLASE in the last 168 hours. No results for input(s): AMMONIA in the last 168 hours.  CBC: No results for input(s): WBC, NEUTROABS, HGB, HCT, MCV, PLT in the last 168 hours.  Cardiac Enzymes: No results for input(s):  CKTOTAL, CKMB, CKMBINDEX, TROPONINI in the last 168 hours.  BNP (last 3 results) No results for input(s): BNP in the last 8760 hours.  ProBNP (last 3 results) No results for input(s): PROBNP in the last 8760 hours.  CBG: No results for input(s): GLUCAP in the last 168 hours.  Coagulation Studies: No results for input(s): LABPROT, INR in the last 72 hours.  Imaging: No results found.  Patient Profile   38 y.o. male with severe HFrEF  d/t NICM s/p HM3 LVAD implantation and polysubstance abuse (cocaine and ETOH).   Assessment/Plan   Syncope/Hypotension - in the setting of hypotension and hematemesis with nausea/vomiting/poor po intake for the last 2 days.  - concern for septic shock in the setting of driveline infection. WBC 19 and Hgb 12.5.  MAP now 75, not requiring pressors.  - LR 75 cc/hr x 10 hrs.  - Started IV Vancomycin /Cefepime . Consult to ID tomorrow.  - I will get CT abdomen/pelvis to assess driveline site, no IV contrast with AKI.  - Check procalcitonin, blood cultures, lactic acid  2. Acute on Chronic systolic HF due to severe NICM s/p HM3 LVAD. - Echo 1/23 EF 40-45% w mildly reduced RV function with 3.5 lpm of flow - HM3 VAD implant 02/26/19 by Dr. Fleeta Brooke - Will get echocardiogram.  - He looks dry on exam, suspect hypovolemia with nausea/vomiting/poor po intake and AKI. Will give gentle IVF as above.  - Hold diuresis with concern for infection   3. Recurrent MSSA DL infection - Initial episode 3/22 - Recurrent infection 2/23 requiring debridement and wound vac.  - ID seen 4/25 and driveline site culture + for few E. coli, P. milrabilis, and E. Faecalis - Treated again with IV abx in 5/25 admission (ampicillin ) - Worry that he has recurrent driveline infection with pain and hypotension, WBCs 19.3 at OSH.  - Blood cultures, procalcitonin - IV vanc/cefepime  - CT abdomen/pelvis as above.    4. VAD - Admit 12/21 with multiple low flow alarms.  Rising LDH and decreasing PLTs. Suspected outflow graft kink occlusion. Patient placed on his left side and resolved. No occlusion seen on CT scan.  - VAD interrogated personally. Multiple PI events noted, no low flows.  Suspect hypovolemia.  - Will give gentle LR as above.  - Send LDH.  - Remains off warfarin due to labile INRs (has not had any anticoagulation recently).  Will give prophylactic heparin .    4. HTN - MAP low at outside hospital, has been stable here (75 most recently).    5. Elevated LFTs - Mild elevation, follow.    6. Weight loss -> protein-calorie malnutrition - has struggled with poor diet since his mother passed - continue to monitor, concern for FTT  7. AKI - AKI in setting of hypotension/syncope from nausea/vomiting/poor po intake.  - Gentle IVF as above.   8. Cocaine abuse - Check UDS.   [place] Lee, NP 09/01/2023, 2:22 PM  Advanced Heart Failure Team Pager [phone] (M-F; 7a - 5p)  Please contact [facility] for night-coverage after hours (4p -7a ) and weekends on [email or web address]

## 2023-09-02 ENCOUNTER — Inpatient Hospital Stay (HOSPITAL_COMMUNITY)

## 2023-09-02 DIAGNOSIS — I5022 Chronic systolic (congestive) heart failure: Secondary | ICD-10-CM

## 2023-09-02 DIAGNOSIS — Z95811 Presence of heart assist device: Secondary | ICD-10-CM

## 2023-09-02 DIAGNOSIS — R55 Syncope and collapse: Secondary | ICD-10-CM

## 2023-09-02 DIAGNOSIS — I5023 Acute on chronic systolic (congestive) heart failure: Secondary | ICD-10-CM

## 2023-09-02 DIAGNOSIS — K852 Alcohol induced acute pancreatitis without necrosis or infection: Secondary | ICD-10-CM

## 2023-09-02 DIAGNOSIS — K8500 Idiopathic acute pancreatitis without necrosis or infection: Secondary | ICD-10-CM

## 2023-09-02 DIAGNOSIS — I429 Cardiomyopathy, unspecified: Secondary | ICD-10-CM | POA: Diagnosis not present

## 2023-09-02 DIAGNOSIS — K859 Acute pancreatitis without necrosis or infection, unspecified: Secondary | ICD-10-CM | POA: Insufficient documentation

## 2023-09-02 DIAGNOSIS — B9561 Methicillin susceptible Staphylococcus aureus infection as the cause of diseases classified elsewhere: Secondary | ICD-10-CM

## 2023-09-02 LAB — RAPID URINE DRUG SCREEN, HOSP PERFORMED
Amphetamines: NOT DETECTED
Barbiturates: NOT DETECTED
Benzodiazepines: NOT DETECTED
Cocaine: POSITIVE — AB
Opiates: NOT DETECTED
Tetrahydrocannabinol: POSITIVE — AB

## 2023-09-02 LAB — BASIC METABOLIC PANEL WITH GFR
Anion gap: 20 — ABNORMAL HIGH (ref 5–15)
BUN: 24 mg/dL — ABNORMAL HIGH (ref 6–20)
CO2: 17 mmol/L — ABNORMAL LOW (ref 22–32)
Calcium: 8.2 mg/dL — ABNORMAL LOW (ref 8.9–10.3)
Chloride: 106 mmol/L (ref 98–111)
Creatinine, Ser: 2.32 mg/dL — ABNORMAL HIGH (ref 0.61–1.24)
GFR, Estimated: 36 mL/min — ABNORMAL LOW (ref >60)
Glucose, Bld: 107 mg/dL — ABNORMAL HIGH (ref 70–99)
Potassium: 3.4 mmol/L — ABNORMAL LOW (ref 3.5–5.1)
Sodium: 143 mmol/L (ref 135–145)

## 2023-09-02 LAB — CBC
HCT: 31.3 % — ABNORMAL LOW (ref 39.0–52.0)
Hemoglobin: 10.9 g/dL — ABNORMAL LOW (ref 13.0–17.0)
MCH: 35.2 pg — ABNORMAL HIGH (ref 26.0–34.0)
MCHC: 34.8 g/dL (ref 30.0–36.0)
MCV: 101 fL — ABNORMAL HIGH (ref 80.0–100.0)
Platelets: 104 K/uL — ABNORMAL LOW (ref 150–400)
RBC: 3.1 MIL/uL — ABNORMAL LOW (ref 4.22–5.81)
RDW: 16.7 % — ABNORMAL HIGH (ref 11.5–15.5)
WBC: 13 K/uL — ABNORMAL HIGH (ref 4.0–10.5)
nRBC: 0 % (ref 0.0–0.2)

## 2023-09-02 LAB — ECHOCARDIOGRAM COMPLETE
Height: 65 in
S' Lateral: 3.4 cm
Weight: 1738.99 [oz_av]

## 2023-09-02 LAB — LIPASE, BLOOD: Lipase: 422 U/L — ABNORMAL HIGH (ref 11–51)

## 2023-09-02 LAB — AMYLASE: Amylase: 316 U/L — ABNORMAL HIGH (ref 28–100)

## 2023-09-02 LAB — VANCOMYCIN, RANDOM: Vancomycin Rm: 13 ug/mL

## 2023-09-02 LAB — LACTATE DEHYDROGENASE: LDH: 189 U/L (ref 98–192)

## 2023-09-02 LAB — PROTIME-INR
INR: 0.9 (ref 0.8–1.2)
Prothrombin Time: 13 s (ref 11.4–15.2)

## 2023-09-02 MED ORDER — ENOXAPARIN SODIUM 30 MG/0.3ML IJ SOSY
30.0000 mg | PREFILLED_SYRINGE | Freq: Every day | INTRAMUSCULAR | Status: DC
  Administered 2023-09-02 – 2023-09-03 (×2): 30 mg via SUBCUTANEOUS
  Filled 2023-09-02 (×2): qty 0.3

## 2023-09-02 MED ORDER — THIAMINE HCL 100 MG/ML IJ SOLN
100.0000 mg | Freq: Every day | INTRAMUSCULAR | Status: DC
  Administered 2023-09-02: 100 mg via INTRAVENOUS
  Filled 2023-09-02: qty 2

## 2023-09-02 MED ORDER — VANCOMYCIN HCL 500 MG/100ML IV SOLN
500.0000 mg | INTRAVENOUS | Status: DC
  Administered 2023-09-02: 500 mg via INTRAVENOUS
  Filled 2023-09-02: qty 100

## 2023-09-02 MED ORDER — LACTATED RINGERS IV SOLN: INTRAVENOUS | Status: AC

## 2023-09-02 MED ORDER — FOLIC ACID 5 MG/ML IJ SOLN
1.0000 mg | Freq: Every day | INTRAMUSCULAR | Status: DC
  Administered 2023-09-02: 1 mg via INTRAVENOUS
  Filled 2023-09-02 (×2): qty 0.2

## 2023-09-02 MED ORDER — POTASSIUM CHLORIDE 10 MEQ/100ML IV SOLN
10.0000 meq | INTRAVENOUS | Status: AC
  Administered 2023-09-02 (×2): 10 meq via INTRAVENOUS
  Filled 2023-09-02: qty 100

## 2023-09-02 MED ORDER — BOOST / RESOURCE BREEZE PO LIQD CUSTOM
1.0000 | Freq: Three times a day (TID) | ORAL | Status: DC
  Administered 2023-09-02 – 2023-09-05 (×9): 1 via ORAL
  Filled 2023-09-02: qty 1

## 2023-09-02 NOTE — Evaluation (Signed)
 Physical Therapy Evaluation Patient Details Name: Semaj Coburn MRN: [id number] DOB: March 08, 1985 Today's Date: 09/02/2023  History of Present Illness  38 y.o. male  admitted with nausea/vomiting and pain at his driveline site starting 7/27.  Patient presented to [facility] ED in [place] 09/01/23 with syncope after several episodes of hematemesis and was transferred to [facility]. Positive for driveline infection/sepsis.  Pt also with pancreatitis due to ETOH use.   PMH: severe HFrEF d/t NICM s/p HM3 LVAD implantation and polysubstance abuse (cocaine and ETOH), Underwent HM-3 LVAD placement on 02/26/19 by Dr. Fleeta Brooke.Multiple driveline infections since placement of LVAD  Clinical Impression  Pt admitted with above diagnosis. Pt was able to tolerate sitting EOB and standing but didn't want to walk today.  Anticipate good progress with no f/u needs and no equipment needs.   Pt currently with functional limitations due to the deficits listed below (see PT Problem List). Pt will benefit from acute skilled PT to increase their independence and safety with mobility to allow discharge.           If plan is discharge home, recommend the following: Assist for transportation   Can travel by private vehicle        Equipment Recommendations None recommended by PT  Recommendations for Other Services       Functional Status Assessment Patient has had a recent decline in their functional status and demonstrates the ability to make significant improvements in function in a reasonable and predictable amount of time.     Precautions / Restrictions Precautions Precautions: Fall Restrictions Weight Bearing Restrictions Per Provider Order: No      Mobility  Bed Mobility Overal bed mobility: Independent                  Transfers Overall transfer level: Needs assistance Equipment used: None Transfers: Sit to/from Stand Sit to Stand: Supervision           General transfer comment: Stood at [facility]  with supervision by therapist but didnt have to assist and took side steps to [facility].    Ambulation/Gait               General Gait Details: declined walking in hall and soft BP  Stairs            Wheelchair Mobility     Tilt Bed    Modified Rankin (Stroke Patients Only)       Balance Overall balance assessment: Needs assistance Sitting-balance support: No upper extremity supported, Feet supported Sitting balance-Leahy Scale: Fair     Standing balance support: No upper extremity supported, During functional activity Standing balance-Leahy Scale: Good                               Pertinent Vitals/Pain Pain Assessment Pain Assessment: No/denies pain    Home Living Family/patient expects to be discharged to:: Private residence Living Arrangements: Other relatives Available Help at Discharge: Family;Available 24 hours/day Type of Home: House Home Access: Stairs to enter Entrance Stairs-Rails: None Entrance Stairs-Number of Steps: 3   Home Layout: One level;Laundry or work area in [employer]: None      Prior Function Prior Level of Function : Independent/Modified Independent             Mobility Comments: no device and walked without assist per pt ADLs Comments: I per pt     Extremity/Trunk Assessment   Upper Extremity Assessment Upper  Extremity Assessment: Defer to OT evaluation    Lower Extremity Assessment Lower Extremity Assessment: Overall WFL for tasks assessed    Cervical / Trunk Assessment Cervical / Trunk Assessment: Normal  Communication   Communication Communication: No apparent difficulties    Cognition Arousal: Alert Behavior During Therapy: Flat affect   PT - Cognitive impairments: No apparent impairments                         Following commands: Intact       Cueing       General Comments General comments (skin integrity, edema, etc.): 115 bpm, 97% RA, Initial BP88/67,  sitting 86/69.  End of session 96/80.  LVAD setttings:  5400 Speed; 4.2 flow; power 4.0; PI 4.6    Exercises     Assessment/Plan    PT Assessment Patient needs continued PT services  PT Problem List Decreased activity tolerance;Decreased balance;Decreased mobility;Decreased knowledge of use of DME;Decreased safety awareness;Decreased knowledge of precautions       PT Treatment Interventions DME instruction;Gait training;Functional mobility training;Therapeutic activities;Therapeutic exercise;Balance training;Patient/family education;Stair training    PT Goals (Current goals can be found in the Care Plan section)  Acute Rehab PT Goals Patient Stated Goal: to go home PT Goal Formulation: With patient Time For Goal Achievement: 09/16/23 Potential to Achieve Goals: Fair    Frequency Min 2X/week     Co-evaluation               AM-PAC PT 6 Clicks Mobility  Outcome Measure Help needed turning from your back to your side while in a flat bed without using bedrails?: None Help needed moving from lying on your back to sitting on the side of a flat bed without using bedrails?: None Help needed moving to and from a bed to a chair (including a wheelchair)?: A Little Help needed standing up from a chair using your arms (e.g., wheelchair or bedside chair)?: A Little Help needed to walk in hospital room?: A Little Help needed climbing 3-5 steps with a railing? : A Little 6 Click Score: 20    End of Session Equipment Utilized During Treatment: Gait belt Activity Tolerance: Patient tolerated treatment well Patient left: in bed;with bed alarm set;with call bell/phone within reach Nurse Communication: Mobility status PT Visit Diagnosis: Muscle weakness (generalized) (M62.81)    Time: 8894-8877 PT Time Calculation (min) (ACUTE ONLY): 17 min   Charges:   PT Evaluation $PT Eval Moderate Complexity: 1 Mod   PT General Charges $$ ACUTE PT VISIT: 1 Visit         Yuliza Cara  M,PT Acute Rehab Services [phone]   Stephane JULIANNA Bevel 09/02/2023, 12:17 PM

## 2023-09-02 NOTE — Progress Notes (Signed)
 Initial Nutrition Assessment  DOCUMENTATION CODES:   Non-severe (moderate) malnutrition in context of chronic illness  INTERVENTION:   Boost Breeze po TID between meals, each supplement provides 250 kcal and 9 grams of protein  Trial 30 ml ProSource Plus BID, each supplement provides 100 kcals and 15 grams protein.   Given hx of polysubstance use and malnutrition, ordered Phosphorus as Add-On to AM labs; phosphorus level returned at 1.3 (low); discussed with Advanced HF team, plan to supplement and recheck in AM; recommend continuing to monitor and supplement until serum values normalizes  Continue MVI with Minerals, Folic Acid , Thiamine  by mouth. If pt not tolerating po (N/V), recommend giving the Thiamine  IV for total of 5-7 days. Received IV Thiamine  (and IV folic acid ) x 1 on 7/29. Recommend discharging patient on MVI and Thiamine    NUTRITION DIAGNOSIS:   Moderate Malnutrition related to chronic illness as evidenced by moderate fat depletion, moderate muscle depletion.  GOAL:   Patient will meet greater than or equal to 90% of their needs  MONITOR:   PO intake, Supplement acceptance, Diet advancement, Weight trends, Labs  REASON FOR ASSESSMENT:   Consult Assessment of nutrition requirement/status  ASSESSMENT:   38 yo male admitted with syncope and hypotension, AKI, acute pancreatitis. Pt with LVAD placed under destination therapy 02/26/2019, hx of recurrent drive line infections, mostly recently treated in the Spring 2025. UDS+ for cocaine and THC. Hx of wt loss and malnutrition in addition to EtOH abuse.  7/28 Admitted 7/29 CT A/P with acute pancreatitis  Pt sleeping on visit but arousable. Pt participates in exam but gives short answers; very polite, answering yes ma'am, no ma'am but unable to give much information when asked open ended questions.   Tolerated CL diet yesterday, diet advanced to FL this AM. Per report, pt vomited this AM. Pt denies any nausea or  abdominal pain at present.  Small black type 5 BM yesterday  Pt is a poor historian. Pt does not know how his height or how much he weighs. Current height 5'5 but noted different heights on previous admissions/encounters.   Per report, pt with reduced oral intake after the passing of his mother. Pt lived with his Mother prior to her passing and she was his primary support. He currently lives with his brother.   Pt reports he eats 3 meals a day but unable to give detailed nutrition history. With regards to EtOH use, pt reports he may have a beer here and there but then states he has not had anything to drink recently.  Hx of EtOH abuse and cocaine abuse; UDS+ cocaine and THC.   RD asked pt if he ate anything this morning, pt stated no. Noted FL lunch tray at bedside, untouched. With regards to eating today, pt states I will do what my doctor tells me to do.    Pt reports he does not take any vitamin/minerals supplements at home, does not take any oral nutrition supplements.   Pt likes the Boost Juice [facility]), does not like the milky supplements.   Current wt 49.3 kg. Pt with hx of malnutrition  AKI resolving with BUN/Creatinine now trended down to normal  Labs: Sodium 143 (wdl) Potassium 3.4 (L) BUN/Creatiine wdl Magnesium  1.9 (wdl) Lipase 422 (H) Amylase 316 (H) Glucose 117 (serum), no CBGs  Meds:  MVI with Minerals Folic Acid  1 mg daily Thiamine  100 mg daily KCL  NUTRITION - FOCUSED PHYSICAL EXAM:  Flowsheet Row Most Recent Value  Orbital Region Moderate  depletion  Upper Arm Region Moderate depletion  Thoracic and Lumbar Region Severe depletion  Buccal Region Moderate depletion  Temple Region Severe depletion  Clavicle Bone Region Moderate depletion  Clavicle and Acromion Bone Region Moderate depletion  Scapular Bone Region Moderate depletion  Dorsal Hand Unable to assess  Patellar Region Moderate depletion  Anterior Thigh Region Moderate depletion   Posterior Calf Region Moderate depletion  Edema (RD Assessment) None    Diet Order:   Diet Order             Diet full liquid Room service appropriate? Yes; Fluid consistency: Thin  Diet effective now                   EDUCATION NEEDS:   Not appropriate for education at this time  Skin:  Skin Assessment: Skin Integrity Issues: Skin Integrity Issues:: Incisions Incisions: Drive line for LVAD (chronic)-hx of infections  Last BM:  7/29  Height:   Ht Readings from Last 1 Encounters:  09/02/23 5' 5 (1.651 m)    Weight:   Wt Readings from Last 1 Encounters:  09/03/23 53.4 kg     BMI:  Body mass index is 19.59 kg/m.  Estimated Nutritional Needs:   Kcal:  2000-2200 kcals  Protein:  100-115 g  Fluid:  1.8 L    Betsey Finger MS, RDN, LDN, CNSC Registered Dietitian 3 Clinical Nutrition RD Inpatient Contact Info in Amion

## 2023-09-02 NOTE — Consult Note (Signed)
 [facility]    Date of Admission:  09/01/2023     Total days of antibiotics 1               Reason for Consult: LVAD Drive line Infection   Referring Provider: Dr. 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 No evidence of infection on imaging with subsequent finding of suspected pancreatitis. This would fit with the symptoms he is describing. Will continue with Cefepime  for now while awaiting culture results but would anticipate only needing supportive care for pancreatitis. If cultures remain negative can resume previous suppressive course of Cefadroxil . Standard/universal precautions. Remaining medical and supportive care per Primary Team.   PLAN:  Continue current dose of Cefepime .  Monitor cultures for bacteremia. Supportive care for pancreatitis.  Standard/universal precautions. LVAD and remaining medical and supportive care per Primary Team.    Principal Problem:   Syncope Active Problems:   Pancreatitis    Chlorhexidine  Gluconate Cloth  6 each Topical Daily   enoxaparin  (LOVENOX ) injection  30 mg Subcutaneous Daily   feeding supplement  1 Container Oral TID BM   folic acid   1 mg Intravenous Daily   thiamine  (VITAMIN B1) injection  100 mg Intravenous Daily     HPI: Jon Martin is a 38 y.o. 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  Jon Martin began having nausea, vomiting and pain at his driveline starting a day prior to presentation from outside hospital where he went after syncopal episode and noted to  have hypotension and WBC count of 19. Transferred to [facility] for follow up. Afebrile on arrival with WBC count of 13.0 Blood cultures drawn without growth <12 hours. CT abdomen/pelvis with no findings to suggest drive line infection or focal abscess and inflammatory changes of the pancreas consistent with pancreatitis. Started on broad spectrum antibiotics.  Jon Martin is feeling better since arriving to the hospital with no further drive line pain and vomiting is improving. Denies fevers, chills or nightsweats. Has been drinking a lot of fluids and juice and got up to get a drink and noted to have syncopal episode which recurred a few minutes later prior to family calling for help. No recent illness. Has been taking his Cefadroxil  as prescribed with no adverse side effects. Drive line pain started after episodes of vomiting.    Review of Systems: Review of Systems  Constitutional:  Negative for chills, fever and weight loss.  Respiratory:  Negative for cough, shortness of breath and wheezing.   Cardiovascular:  Negative for chest pain and leg swelling.  Gastrointestinal:  Positive for vomiting. Negative for abdominal pain, constipation, diarrhea and nausea.  Skin:  Negative for rash.     Past Medical History:  Diagnosis Date   Dilated cardiomyopathy (HCC)    Dilated cardiomyopathy (HCC) 02/2019   Polysubstance abuse (HCC)     Social History   Tobacco Use   Smoking status: Former    Types: Cigarettes   Smokeless tobacco: Never  Vaping Use   Vaping status: Some Days  Substance Use Topics   Alcohol use:  Yes   Drug use: Not Currently    Types: Marijuana    Comment: Hx of cocaine use, smoke legal marijuana occasionally - last time [id number]    Family History  Problem Relation Age of Onset   CAD Father     No Known Allergies  OBJECTIVE: Blood pressure (!) 89/71, pulse 96, temperature (!) 97.3 F (36.3 C), temperature source Axillary, resp. rate 17, height 5' 5 (1.651 m),  weight 49.3 kg, SpO2 100%.  Physical Exam Constitutional:      General: He is not in acute distress.    Appearance: He is well-developed.  Cardiovascular:     Rate and Rhythm: Normal rate and regular rhythm.     Comments: LVAD hum Pulmonary:     Effort: Pulmonary effort is normal.     Breath sounds: Normal breath sounds.  Skin:    General: Skin is warm and dry.  Neurological:     Mental Status: He is alert.  Psychiatric:        Mood and Affect: Mood normal.        Judgment: Judgment normal.     Lab Results Lab Results  Component Value Date   WBC 13.0 (H) 09/02/2023   HGB 10.9 (L) 09/02/2023   HCT 31.3 (L) 09/02/2023   MCV 101.0 (H) 09/02/2023   PLT 104 (L) 09/02/2023    Lab Results  Component Value Date   CREATININE 2.32 (H) 09/02/2023   BUN 24 (H) 09/02/2023   NA 143 09/02/2023   K 3.4 (L) 09/02/2023   CL 106 09/02/2023   CO2 17 (L) 09/02/2023    Lab Results  Component Value Date   ALT 41 09/01/2023   AST 75 (H) 09/01/2023   ALKPHOS 65 09/01/2023   BILITOT 2.6 (H) 09/01/2023     Microbiology: Recent Results (from the past 240 hours)  MRSA Next Gen by PCR, Nasal     Status: None   Collection Time: 09/01/23  7:42 PM   Specimen: Nasal Mucosa; Nasal Swab  Result Value Ref Range Status   MRSA by PCR Next Gen NOT DETECTED NOT DETECTED Final    Comment: (NOTE) The GeneXpert MRSA Assay (FDA approved for NASAL specimens only), is one component of a comprehensive MRSA colonization surveillance program. It is not intended to diagnose MRSA infection nor to guide or monitor treatment for MRSA infections. Test performance is not FDA approved in patients less than 14 years old. Performed at [facility], [address], [place], [place] [address]   Culture, blood (Routine X 2) w Reflex to ID Panel     Status: None (Preliminary result)   Collection Time: 09/01/23  9:30 PM   Specimen: BLOOD RIGHT HAND  Result Value Ref Range Status   Specimen Description  BLOOD RIGHT HAND  Final   Special Requests   Final    BOTTLES DRAWN AEROBIC AND ANAEROBIC Blood Culture adequate volume   Culture   Final    NO GROWTH < 12 HOURS Performed at [facility], [address], [place], [place] [address]    Report Status PENDING  Incomplete  Culture, blood (Routine X 2) w Reflex to ID Panel     Status: None (Preliminary result)   Collection Time: 09/01/23  9:42 PM   Specimen: BLOOD LEFT ARM  Result Value Ref Range Status   Specimen Description BLOOD LEFT ARM  Final   Special Requests   Final    BOTTLES DRAWN AEROBIC AND ANAEROBIC Blood  Culture adequate volume   Culture   Final    NO GROWTH < 12 HOURS Performed at [facility], [address] [place], [place] [address]    Report Status PENDING  Incomplete     Cathlyn July, NP [facility]  09/02/2023  2:58 PM

## 2023-09-02 NOTE — Progress Notes (Addendum)
 Advanced Heart Failure VAD Team Note  PCP-Cardiologist: None  Chief Complaint: Syncope Subjective:   7/28: Admitted post syncopal episode. Transferred to [facility]  Feels ok this morning. Last emesis this morning, not bloody. Refused zofran .   CTA/P consistent with acute pancreatitis. Reports drinking 1-2 ETOH drinks a day. Continue Cefepime  and Vanc.   LVAD INTERROGATION:  HeartMate II LVAD:   Flow 3.9 liters/min, speed 5400, power 4.1, PI 5.2.  x9 PI events  Objective:    Vital Signs:   Temp:  [98 F (36.7 C)-98.5 F (36.9 C)] 98.5 F (36.9 C) (07/29 0320) Pulse Rate:  [88-149] 149 (07/29 0700) Resp:  [10-25] 15 (07/29 0700) BP: (82-97)/(57-82) 90/74 (07/29 0700) SpO2:  [86 %-100 %] 98 % (07/29 0700) Weight:  [49.3 kg] 49.3 kg (07/29 0426) Last BM Date : 09/01/23 Mean arterial Pressure 80s  Intake/Output:   Intake/Output Summary (Last 24 hours) at 09/02/2023 0738 Last data filed at 09/02/2023 0701 Gross per 24 hour  Intake 681.98 ml  Output 300 ml  Net 381.98 ml     Physical Exam  General:  Frail appearing. No resp difficulty Neck:  JVP flat.  Cor: Mechanical heart sounds with LVAD hum present. Lungs: Clear Abdomen: soft, nontender, nondistended.  Driveline:Securement device intact and driveline incorporated. Driveline dressing to be changed today Extremities: no edema Neuro: alert & oriented x3. Affect pleasant   Telemetry   NSR-ST 70s-low 100s + intt PVCs (Personally reviewed)    Labs   Basic Metabolic Panel: Recent Labs  Lab 09/01/23 2135 09/02/23 0435  NA 146* 143  K 4.3 3.4*  CL 107 106  CO2 14* 17*  GLUCOSE 133* 107*  BUN 26* 24*  CREATININE 2.86* 2.32*  CALCIUM 8.0* 8.2*  MG 1.9  --     Liver Function Tests: Recent Labs  Lab 09/01/23 2135  AST 75*  ALT 41  ALKPHOS 65  BILITOT 2.6*  PROT 7.4  ALBUMIN  3.9   No results for input(s): LIPASE, AMYLASE in the last 168 hours. No results for input(s): AMMONIA in the last 168  hours.  CBC: Recent Labs  Lab 09/01/23 2135 09/02/23 0435  WBC 15.8* 13.0*  NEUTROABS 13.6*  --   HGB 11.5* 10.9*  HCT 34.4* 31.3*  MCV 103.6* 101.0*  PLT 111* 104*    INR: Recent Labs  Lab 09/02/23 0435  INR 0.9    Other results: EKG:    Imaging   CT ABDOMEN PELVIS WO CONTRAST Result Date: 09/02/2023 CLINICAL DATA:  Known left ventricular assist device, evaluate for line infection EXAM: CT ABDOMEN AND PELVIS WITHOUT CONTRAST TECHNIQUE: Multidetector CT imaging of the abdomen and pelvis was performed following the standard protocol without IV contrast. RADIATION DOSE REDUCTION: This exam was performed according to the departmental dose-optimization program which includes automated exposure control, adjustment of the mA and/or kV according to patient size and/or use of iterative reconstruction technique. COMPARISON:  02/27/2021 FINDINGS: Lower chest: Lung bases are well aerated. Left ventricular assist device is noted. The aortic conduit is not completely visualized. The line is well visualized in the subcutaneous tissues of the abdomen. The previously seen inflammatory changes adjacent to the drive line have been excised as per the clinical history. The degree of subcutaneous course of the drive line on the left has decreased due to the excision. No new inflammatory changes to suggest drive line infection are noted. Hepatobiliary: No focal liver abnormality is seen. No gallstones, gallbladder wall thickening, or biliary dilatation. Pancreas: Pancreas is well  visualized. The borders are indistinct with mild peripancreatic inflammatory change consistent with acute pancreatitis. No pseudocyst is seen. Spleen: Normal in size without focal abnormality. Adrenals/Urinary Tract: Adrenal glands are within normal limits. The kidneys are well visualized bilaterally. No renal calculi or obstructive changes are seen. Bladder is within normal limits. Stomach/Bowel: No obstructive or inflammatory  changes of the colon are seen. The appendix is within normal limits. Small bowel and stomach are unremarkable. Vascular/Lymphatic: No significant vascular findings are present. No enlarged abdominal or pelvic lymph nodes. Reproductive: Prostate is unremarkable. Other: No abdominal wall hernia or abnormality. No abdominopelvic ascites. Musculoskeletal: No acute or significant osseous findings. IMPRESSION: Findings consistent with prior LVAD placement. There are no findings to suggest drive line infection or focal abscess. Inflammatory changes of the pancreas are noted consistent with acute pancreatitis. Correlate with laboratory values. No other focal abnormality is noted. Electronically Signed   By: Oneil Devonshire M.D.   On: 09/02/2023 00:42   DG Chest Port 1 View Result Date: 09/01/2023 CLINICAL DATA:  Heart failure EXAM: PORTABLE CHEST 1 VIEW COMPARISON:  02/26/2021 FINDINGS: Postoperative changes in the mediastinum. Left ventricular assist device is in place. Heart size and pulmonary vascularity are normal. Lungs are clear. No pleural effusion or pneumothorax. Mediastinal contours appear intact. No significant change since prior study. IMPRESSION: No active disease. Electronically Signed   By: Elsie Gravely M.D.   On: 09/01/2023 22:09     Medications:     Scheduled Medications:  Chlorhexidine  Gluconate Cloth  6 each Topical Daily    Infusions:  ceFEPime  (MAXIPIME ) IV     lactated ringers  75 mL/hr at 09/02/23 0701    PRN Medications: acetaminophen , ondansetron  (ZOFRAN ) IV, mouth rinse   Patient Profile   Jon Martin is a 38 y.o. male with severe HFrEF d/t NICM s/p HM3 LVAD implantation and polysubstance abuse (cocaine and ETOH). Transferred from [place] with syncope and sepsis.   Assessment/Plan:   Syncope/Hypotension - In the setting of hypotension and hematemesis with nausea/vomiting/poor po intake for the last 2 days.  - concern for septic shock in the setting of driveline  infection. WBC 19>15.8>13 and Hgb 12.5>11.5>10.9.  MAPs stable. - LR 75 cc/hr x 10 hrs complete. Will continue gentle rehydration for another 20 hrs (1.5L).  - Continue IV Vancomycin /Cefepime . ID to see today.  - CT A/P consistent with acute pancreatitis - Procalcitonin WNL, blood cultures NGTD, lactic acid 1.4   2. Acute on Chronic systolic HF due to severe NICM s/p HM3 LVAD. - Echo 1/23 EF 40-45% w mildly reduced RV function with 3.5 lpm of flow - HM3 VAD implant 02/26/19 by Dr. Fleeta Brooke - Will get echocardiogram.  - He looks dry on exam, suspect hypovolemia with nausea/vomiting/poor po intake and AKI. Will give gentle IVF as above.  - Hold diuresis with concern for infection   3. Recurrent MSSA DL infection - Initial episode 3/22 - Recurrent infection 2/23 requiring debridement and wound vac.  - ID seen 4/25 and driveline site culture + for few E. coli, P. milrabilis, and E. Faecalis - Treated again with IV abx in 5/25 admission (ampicillin ) - Blood cultures, procalcitonin - IV vanc/cefepime  - CT abdomen/pelvis as above.    4. VAD - Admit 12/21 with multiple low flow alarms. Rising LDH and decreasing PLTs. Suspected outflow graft kink occlusion. Patient placed on his left side and resolved. No occlusion seen on CT scan.  - VAD interrogated personally. 9 PI events today but no low flows or  speed drops.  - CTA/P: no findings to suggest infection/ abscess.  - Continue gentle LR as above.  - LDH stable.  - Remains off warfarin due to labile INRs (has not had any anticoagulation recently).     4. HTN - MAP low at outside hospital. They have been stable here. 70s-80s   5. Elevated LFTs - Mild elevation, follow.    6. Weight loss -> protein-calorie malnutrition - has struggled with poor diet since his mother passed - continue to monitor, concern for FTT   7. AKI - AKI in setting of hypotension/syncope from nausea/vomiting/poor po intake.  - Gentle IVF as above.  - renal  function improving   8. Cocaine abuse - UDS pending.   9. Acute pancreatitis ETOH abuse - noted on CT A/P  - Continue gentle rehydration - PRN Zofran  - Continue clear liquid diet. Plan to advance when PO intake more tolerable - Continue Vanc/Cefepime  - Check amylase/lipase - RUQ US  today - Add thiamine  and folate. Monitor for CIWA needs  I reviewed the LVAD parameters from today, and compared the results to the patient's prior recorded data.  No programming changes were made.  The LVAD is functioning within specified parameters.  The patient performs LVAD self-test daily.  LVAD interrogation was negative for any significant power changes, alarms or PI events/speed drops.  LVAD equipment check completed and is in good working order.  Back-up equipment present.   LVAD education done on emergency procedures and precautions and reviewed exit site care.  Length of Stay: 1  Beckey LITTIE Coe, NP 09/02/2023, 7:38 AM  VAD Team --- VAD ISSUES ONLY--- Pager [phone] (7am - 7am)  Advanced Heart Failure Team  Pager [phone] (M-F; 7a - 5p)  Please contact [facility] for night-coverage after hours (5p -7a ) and weekends on [email or web address]  Patient seen with NP, I formulated the plan and agree with the above note.   CT abdomen/pelvis yesterday showed no inflammatory changes at the driveline, but acute pancreatitis noted.  He still has had some nausea/vomiting (earlier this morning).  Currently, no abdominal pain. Creatinine trending down to 2.32.  HCT trending down with IV fluid.  WBCs trending down. MAP stable in 80s.   LVAD parameters stable with 9 PI events since arrival.   Driveline site looks ok.   General: Well appearing this am. NAD.  HEENT: Normal. Neck: Supple, JVP 7-8 cm. Carotids OK.  Cardiac:  Mechanical heart sounds with LVAD hum present.  Lungs:  CTAB, normal effort.  Abdomen:  Currently nontender, ND, no HSM. No bruits or masses. +BS  LVAD exit site: Well-healed and incorporated.  Dressing dry and intact. No erythema or drainage. Stabilization device present and accurately applied. Driveline dressing changed daily per sterile technique. Extremities:  Warm and dry. No cyanosis, clubbing, rash, or edema.  Neuro:  Alert & oriented x 3. Cranial nerves grossly intact. Moves all 4 extremities w/o difficulty. Affect pleasant    Acute pancreatitis on abdominal CT seems to be the cause of this presentation.  No gallstones seen on CT, and he reports no more than 1-2 drinks ETOH/day.  AST 75, tbili 2.6.  ETOH level was not elevated at admission.  Currently nontender abdomen, was tender yesterday.  Had some nausea/vomiting.  - Continue LR 75 cc/hr for 20 hrs.  HCT trending down.  - Continue clear liquid diet.  - Check amylase/lipase.  - Daily CMET - Will get RUQ US  to confirm no gallstones/biliary dilation.  - Continue broad spectrum  abx vanco/cefepime  as he is improving and WBCs trending down.   LVAD parameters stable except for occasional PIs likely due to initial hypovolemia. LDH normal at 189. The driveline looks stable, no inflammatory changes on CT around the driveline.  - He has not been anticoagulated at home.  Will put him on prophylactic Lovenox  here.   AKI resolving with IVF, continue.   CRITICAL CARE Performed by: Ezra Shuck  Total critical care time: 35 minutes  Critical care time was exclusive of separately billable procedures and treating other patients.  Critical care was necessary to treat or prevent imminent or life-threatening deterioration.  Critical care was time spent personally by me on the following activities: development of treatment plan with patient and/or surrogate as well as nursing, discussions with consultants, evaluation of patient's response to treatment, examination of patient, obtaining history from patient or surrogate, ordering and performing treatments and interventions, ordering and review of laboratory studies, ordering and review of  radiographic studies, pulse oximetry and re-evaluation of patient's condition.  Ezra Shuck 09/02/2023 8:39 AM

## 2023-09-02 NOTE — Progress Notes (Signed)
  Echocardiogram 2D Echocardiogram has been performed.  Tinnie FORBES Gosling RDCS 09/02/2023, 9:57 AM

## 2023-09-02 NOTE — Progress Notes (Signed)
 LVAD Coordinator Rounding Note:  Admitted 09/01/23 to Heart Failure service for syncope from [facility] ED.   HM III LVAD implanted on 02/26/19 by Dr. Obadiah under Destination Therapy criteria.  Patient presented to ER at [facility] in [place] with vomiting - pt states I woke up on the floor.  Pt laying in bed on my arrival. Denies complaints. States he is feeling better this morning. Still vomiting. CT scan consistent for acute pancreatitis. Pt states he isn't drinking ETOH but is drinking juice most of the time. UDS has not been collected. I have ask nursing to please collect urine for UDS.   Pt has hx of drive line infection. Driveline is unremarkable. Please see below for full documentation. Pt is on chronic Cefadroxil .   Vital signs: Temp: 97.2 HR: 95 Doppler Pressure: 84 Automatic BP: 90/74 (81) O2 Sat: 98% RA Wt: 108.6 lbs  LVAD interrogation reveals:  Speed: 5400 Flow: 3.9 Power: 3.9w PI: 5.6 Hct: 20 - DO NOT CHANGE   Alarms: none Events: 10 PI today  Fixed speed: 5400 Low speed limit: 5100  Drive Line: Existing VAD dressing removed and site care performed using sterile technique. Drive line exit site cleaned with Chlora prep applicators x 2, allowed to dry, and gauze dressing applied. Exit site healed and incorporated, the velour is exposed significantly at exit site. No redness, tenderness, scant amount of brown drainage, no foul odor or rash noted. Drive line anchor re-applied. Continue weekly dressing changes using the daily kit. Next dressing change 09/09/23 by bedside nurse.        Labs:  LDH trend: 189  Hgb trend: 10.9  Anticoagulation Plan: -INR Goal: not on coumadin  -ASA Dose: none  Blood Products:  09/01/23: 1u/PRBC at [facility]   Device: N/A  Infection:  09/01/23 Blood cultures >> performed at [facility] pending  Drips:  LR 75 ml/hr  Plan/Recommendations:  1. Call VAD Coordinator if any VAD equipment or drive line issues. 2. Driveline dressing  changes weekly per bedside nurse.    Lauraine Ip RN, BSN VAD Coordinator 24/7 Pager [phone]

## 2023-09-02 NOTE — Progress Notes (Signed)
 Pharmacy Antibiotic Note  Jon Martin is a 38 y.o. 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  Pharmacy has been consulted for vancomycin  and cefepime  dosing. Vancomycin  1250mg  IV x1 given at OSH Vancomycin  random level dawn 4hr after dose  = 13 Cr improving now 2.3   Plan: Vancomycin  500mg  IV q24hr  Cefepime  2g  q24hr     Recent Labs  Lab 09/01/23 2120 09/01/23 2135 09/02/23 0435 09/02/23 1356  WBC  --  15.8* 13.0*  --   CREATININE  --  2.86* 2.32*  --   LATICACIDVEN 1.4  --   --   --   VANCORANDOM  --   --   --  13      No Known Allergies  Antimicrobials this admission: Vancomycin  7/28 >>  Cefepime  2g 7/28 >>   Microbiology results: 7/28 BCx: 4/4 pending, also collected at [facility] prior to transfer 7/28 MRSA PCR: pending   Olam Chalk Pharm.D. CPP, BCPS Clinical Pharmacist [phone] 09/02/2023 3:44 PM    09/02/2023 3:40 PM

## 2023-09-03 DIAGNOSIS — I5022 Chronic systolic (congestive) heart failure: Secondary | ICD-10-CM | POA: Diagnosis not present

## 2023-09-03 DIAGNOSIS — K8500 Idiopathic acute pancreatitis without necrosis or infection: Secondary | ICD-10-CM | POA: Diagnosis not present

## 2023-09-03 DIAGNOSIS — I429 Cardiomyopathy, unspecified: Secondary | ICD-10-CM | POA: Diagnosis not present

## 2023-09-03 DIAGNOSIS — Z95811 Presence of heart assist device: Secondary | ICD-10-CM | POA: Diagnosis not present

## 2023-09-03 LAB — COMPREHENSIVE METABOLIC PANEL WITH GFR
ALT: 36 U/L (ref 0–44)
AST: 108 U/L — ABNORMAL HIGH (ref 15–41)
Albumin: 2.9 g/dL — ABNORMAL LOW (ref 3.5–5.0)
Alkaline Phosphatase: 43 U/L (ref 38–126)
Anion gap: 9 (ref 5–15)
BUN: 17 mg/dL (ref 6–20)
CO2: 23 mmol/L (ref 22–32)
Calcium: 8.7 mg/dL — ABNORMAL LOW (ref 8.9–10.3)
Chloride: 110 mmol/L (ref 98–111)
Creatinine, Ser: 1.14 mg/dL (ref 0.61–1.24)
GFR, Estimated: >60 mL/min (ref >60)
Glucose, Bld: 117 mg/dL — ABNORMAL HIGH (ref 70–99)
Potassium: 3.2 mmol/L — ABNORMAL LOW (ref 3.5–5.1)
Sodium: 142 mmol/L (ref 135–145)
Total Bilirubin: 1.1 mg/dL (ref 0.0–1.2)
Total Protein: 5.5 g/dL — ABNORMAL LOW (ref 6.5–8.1)

## 2023-09-03 LAB — CBC
HCT: 28.2 % — ABNORMAL LOW (ref 39.0–52.0)
Hemoglobin: 9.7 g/dL — ABNORMAL LOW (ref 13.0–17.0)
MCH: 35 pg — ABNORMAL HIGH (ref 26.0–34.0)
MCHC: 34.4 g/dL (ref 30.0–36.0)
MCV: 101.8 fL — ABNORMAL HIGH (ref 80.0–100.0)
Platelets: 76 K/uL — ABNORMAL LOW (ref 150–400)
RBC: 2.77 MIL/uL — ABNORMAL LOW (ref 4.22–5.81)
RDW: 16.4 % — ABNORMAL HIGH (ref 11.5–15.5)
WBC: 6.9 K/uL (ref 4.0–10.5)
nRBC: 0 % (ref 0.0–0.2)

## 2023-09-03 LAB — LIPID PANEL
Cholesterol: 145 mg/dL (ref 0–200)
HDL: 50 mg/dL (ref >40)
LDL Cholesterol: 47 mg/dL (ref 0–99)
Total CHOL/HDL Ratio: 2.9 ratio
Triglycerides: 240 mg/dL — ABNORMAL HIGH (ref <150)
VLDL: 48 mg/dL — ABNORMAL HIGH (ref 0–40)

## 2023-09-03 LAB — LACTATE DEHYDROGENASE: LDH: 204 U/L — ABNORMAL HIGH (ref 98–192)

## 2023-09-03 LAB — PHOSPHORUS: Phosphorus: 1.3 mg/dL — ABNORMAL LOW (ref 2.5–4.6)

## 2023-09-03 MED ORDER — POTASSIUM CHLORIDE CRYS ER 20 MEQ PO TBCR
40.0000 meq | EXTENDED_RELEASE_TABLET | ORAL | Status: AC
  Administered 2023-09-03 (×2): 40 meq via ORAL
  Filled 2023-09-03 (×2): qty 2

## 2023-09-03 MED ORDER — CEFADROXIL 500 MG PO CAPS
500.0000 mg | ORAL_CAPSULE | Freq: Two times a day (BID) | ORAL | Status: DC
  Administered 2023-09-03 – 2023-09-05 (×5): 500 mg via ORAL
  Filled 2023-09-03 (×6): qty 1

## 2023-09-03 MED ORDER — THIAMINE MONONITRATE 100 MG PO TABS
100.0000 mg | ORAL_TABLET | Freq: Every day | ORAL | Status: DC
  Administered 2023-09-03 – 2023-09-05 (×3): 100 mg via ORAL
  Filled 2023-09-03 (×3): qty 1

## 2023-09-03 MED ORDER — POTASSIUM CHLORIDE 10 MEQ/100ML IV SOLN
10.0000 meq | INTRAVENOUS | Status: AC
  Administered 2023-09-03 (×3): 10 meq via INTRAVENOUS
  Filled 2023-09-03 (×3): qty 100

## 2023-09-03 MED ORDER — POTASSIUM PHOSPHATES 15 MMOLE/5ML IV SOLN
15.0000 mmol | Freq: Once | INTRAVENOUS | Status: AC
  Administered 2023-09-03: 15 mmol via INTRAVENOUS
  Filled 2023-09-03: qty 5

## 2023-09-03 MED ORDER — ADULT MULTIVITAMIN W/MINERALS CH
1.0000 | ORAL_TABLET | Freq: Every day | ORAL | Status: DC
  Administered 2023-09-03 – 2023-09-05 (×3): 1 via ORAL
  Filled 2023-09-03 (×3): qty 1

## 2023-09-03 MED ORDER — POTASSIUM CHLORIDE CRYS ER 20 MEQ PO TBCR
40.0000 meq | EXTENDED_RELEASE_TABLET | Freq: Once | ORAL | Status: AC
  Administered 2023-09-03: 40 meq via ORAL
  Filled 2023-09-03: qty 2

## 2023-09-03 MED ORDER — SODIUM CHLORIDE 0.9 % IV SOLN: 2.0000 g | Freq: Three times a day (TID) | INTRAVENOUS | Status: DC

## 2023-09-03 MED ORDER — FOLIC ACID 1 MG PO TABS
1.0000 mg | ORAL_TABLET | Freq: Every day | ORAL | Status: DC
  Administered 2023-09-03 – 2023-09-05 (×3): 1 mg via ORAL
  Filled 2023-09-03 (×3): qty 1

## 2023-09-03 MED ORDER — VANCOMYCIN HCL 750 MG/150ML IV SOLN
750.0000 mg | INTRAVENOUS | Status: DC
  Filled 2023-09-03: qty 150

## 2023-09-03 NOTE — Progress Notes (Signed)
    [facility]  Date of Admission:  09/01/2023     Reason for Follow Up: Pancreatitis           Brief Progress Note:  Afebrile overnight with no acute events. Cultures remain without growth and has improvement in symptoms. Unlikely any new infection with recommendation to return to suppressive therapy with Cefadroxil  500 mg PO bid and follow up with ID. Ok for discharge from ID standpoint.    Greg Dayne Chait, NP [facility]  09/03/2023  9:18 AM

## 2023-09-03 NOTE — Progress Notes (Addendum)
 Advanced Heart Failure VAD Team Note  PCP-Cardiologist: None  Chief Complaint: Syncope / Acute Pancreatitis Subjective:   7/28: Admitted post syncopal episode. Transferred to [facility]  CTA/P consistent with acute pancreatitis. Reports drinking 1-2 ETOH drinks a day. Continue Cefepime  and Vanc. UDS + cocaine and THS.   Got IV rehydration the last 2 days. SCr now WNL.   Feels better this morning. Tolerating PO fluids.   LVAD INTERROGATION:  HeartMate II LVAD:   Flow 3.7 liters/min, speed 5400, power 3.9, PI 7. No PI events  Objective:    Vital Signs:   Temp:  [97.3 F (36.3 C)-98.7 F (37.1 C)] 98.7 F (37.1 C) (07/30 0400) Pulse Rate:  [77-224] 84 (07/30 0700) Resp:  [7-20] 12 (07/30 0700) BP: (75-99)/(25-85) 94/70 (07/30 0700) SpO2:  [69 %-100 %] 99 % (07/30 0700) Weight:  [53.4 kg] 53.4 kg (07/30 0400) Last BM Date : 09/02/23 Mean arterial Pressure 70s-80s  Intake/Output:   Intake/Output Summary (Last 24 hours) at 09/03/2023 0737 Last data filed at 09/03/2023 0700 Gross per 24 hour  Intake 2075.39 ml  Output 275 ml  Net 1800.39 ml     Physical Exam  General:  Frail appearing. No resp difficulty Neck:  JVP flat.  Cor: Mechanical heart sounds with LVAD hum present. Lungs: Clear Abdomen: soft, nontender, nondistended.  Driveline: C/D/I; securement device intact and driveline incorporated Extremities: no edema Neuro: alert & oriented x3. Affect flat  Telemetry   NSR 70s-90s intt PVCs (Personally reviewed)    Labs   Basic Metabolic Panel: Recent Labs  Lab 09/01/23 2135 09/02/23 0435 09/03/23 0254  NA 146* 143 142  K 4.3 3.4* 3.2*  CL 107 106 110  CO2 14* 17* 23  GLUCOSE 133* 107* 117*  BUN 26* 24* 17  CREATININE 2.86* 2.32* 1.14  CALCIUM 8.0* 8.2* 8.7*  MG 1.9  --   --     Liver Function Tests: Recent Labs  Lab 09/01/23 2135 09/03/23 0254  AST 75* 108*  ALT 41 36  ALKPHOS 65 43  BILITOT 2.6* 1.1  PROT 7.4 5.5*  ALBUMIN  3.9 2.9*   Recent  Labs  Lab 09/02/23 0842  LIPASE 422*  AMYLASE 316*   No results for input(s): AMMONIA in the last 168 hours.  CBC: Recent Labs  Lab 09/01/23 2135 09/02/23 0435 09/03/23 0254  WBC 15.8* 13.0* 6.9  NEUTROABS 13.6*  --   --   HGB 11.5* 10.9* 9.7*  HCT 34.4* 31.3* 28.2*  MCV 103.6* 101.0* 101.8*  PLT 111* 104* 76*    INR: Recent Labs  Lab 09/02/23 0435  INR 0.9    Other results: EKG:    Imaging   US  Abdomen Limited RUQ (LIVER/GB) Result Date: 09/02/2023 CLINICAL DATA:  Pancreatitis EXAM: ULTRASOUND ABDOMEN LIMITED RIGHT UPPER QUADRANT COMPARISON:  Noncontrast CT 09/02/2023.  Ultrasound 10/02/2021 FINDINGS: Gallbladder: Distended gallbladder with some layering sludge. No shadowing stones. No wall thickening or adjacent fluid. No reported Murphy's sign. Common bile duct: Diameter: 2 mm Liver: Mildly echogenic hepatic parenchyma consistent with fatty liver infiltration. Portal vein is patent on color Doppler imaging with normal direction of blood flow towards the liver. Other: None. IMPRESSION: Distended gallbladder with some sludge. No shadowing stones or ductal dilatation. Mild fatty liver infiltration. Electronically Signed   By: Ranell Bring M.D.   On: 09/02/2023 13:01   ECHOCARDIOGRAM COMPLETE Result Date: 09/02/2023    ECHOCARDIOGRAM REPORT   Patient Name:   Jon Martin Date of Exam: 09/02/2023 Medical Rec #:  [id number]       Height:       65.0 in Accession #:    [id number]      Weight:       108.7 lb Date of Birth:  [birth year]        BSA:          1.526 m Patient Age:    38 years        BP:           81/60 mmHg Patient Gender: M               HR:           85 bpm. Exam Location:  Inpatient Procedure: 2D Echo, Color Doppler and Cardiac Doppler (Both Spectral and Color            Flow Doppler were utilized during procedure). Indications:    CHF I50.9  History:        Patient has prior history of Echocardiogram examinations, most                 recent 02/27/2021.  Sonographer:     Tinnie Gosling RDCS Referring Phys: [id number] [place] LEE IMPRESSIONS  1. LVAD- parameters not entered. Study assumes: Flow 3.9 liters/min, speed 5400, power 4.1, PI 5.2. Neutral Septum.. Left ventricular ejection fraction, by estimation, is 40 to 45%. The left ventricle has mildly decreased function. The left ventricle demonstrates global hypokinesis. Left ventricular diastolic parameters are indeterminate.  2. Right ventricular systolic function was not well visualized. The right ventricular size is not well visualized.  3. The mitral valve is grossly normal. Trivial mitral valve regurgitation. No evidence of mitral stenosis.  4. Aortic valve opens consistently. The aortic valve is tricuspid. Aortic valve regurgitation is mild. No aortic stenosis is present. Comparison(s): Prior images reviewed side by side. Mitral regurgitation has improved. Aortic regurgitation is new. This study is technically difficult. FINDINGS  Left Ventricle: LVAD- parameters not entered. Study assumes: Flow 3.9 liters/min, speed 5400, power 4.1, PI 5.2. Neutral Septum. Left ventricular ejection fraction, by estimation, is 40 to 45%. The left ventricle has mildly decreased function. The left ventricle demonstrates global hypokinesis. The left ventricular internal cavity size was normal in size. There is no left ventricular hypertrophy. Left ventricular diastolic parameters are indeterminate. Right Ventricle: The right ventricular size is not well visualized. No increase in right ventricular wall thickness. Right ventricular systolic function was not well visualized. Left Atrium: Left atrial size was not well visualized. Right Atrium: Right atrial size was not well visualized. Pericardium: There is no evidence of pericardial effusion. Mitral Valve: The mitral valve is grossly normal. Trivial mitral valve regurgitation. No evidence of mitral valve stenosis. Tricuspid Valve: The tricuspid valve is normal in structure. Tricuspid valve  regurgitation is not demonstrated. No evidence of tricuspid stenosis. Aortic Valve: Aortic valve opens consistently. The aortic valve is tricuspid. Aortic valve regurgitation is mild. No aortic stenosis is present. Pulmonic Valve: The pulmonic valve was normal in structure. Pulmonic valve regurgitation is not visualized. No evidence of pulmonic stenosis. Aorta: The aortic root and ascending aorta are structurally normal, with no evidence of dilitation. IAS/Shunts: The atrial septum is grossly normal.  LEFT VENTRICLE PLAX 2D LVIDd:         3.90 cm LVIDs:         3.40 cm LV PW:         0.70 cm LV IVS:        0.70 cm LVOT  diam:     2.00 cm LVOT Area:     3.14 cm  LEFT ATRIUM         Index LA diam:    3.50 cm 2.29 cm/m   AORTA Ao Root diam: 3.10 cm Ao Asc diam:  3.30 cm  SHUNTS Systemic Diam: 2.00 cm Stanly Leavens MD Electronically signed by Stanly Leavens MD Signature Date/Time: 09/02/2023/10:45:21 AM    Final    CT ABDOMEN PELVIS WO CONTRAST Result Date: 09/02/2023 CLINICAL DATA:  Known left ventricular assist device, evaluate for line infection EXAM: CT ABDOMEN AND PELVIS WITHOUT CONTRAST TECHNIQUE: Multidetector CT imaging of the abdomen and pelvis was performed following the standard protocol without IV contrast. RADIATION DOSE REDUCTION: This exam was performed according to the departmental dose-optimization program which includes automated exposure control, adjustment of the mA and/or kV according to patient size and/or use of iterative reconstruction technique. COMPARISON:  02/27/2021 FINDINGS: Lower chest: Lung bases are well aerated. Left ventricular assist device is noted. The aortic conduit is not completely visualized. The line is well visualized in the subcutaneous tissues of the abdomen. The previously seen inflammatory changes adjacent to the drive line have been excised as per the clinical history. The degree of subcutaneous course of the drive line on the left has decreased due to the  excision. No new inflammatory changes to suggest drive line infection are noted. Hepatobiliary: No focal liver abnormality is seen. No gallstones, gallbladder wall thickening, or biliary dilatation. Pancreas: Pancreas is well visualized. The borders are indistinct with mild peripancreatic inflammatory change consistent with acute pancreatitis. No pseudocyst is seen. Spleen: Normal in size without focal abnormality. Adrenals/Urinary Tract: Adrenal glands are within normal limits. The kidneys are well visualized bilaterally. No renal calculi or obstructive changes are seen. Bladder is within normal limits. Stomach/Bowel: No obstructive or inflammatory changes of the colon are seen. The appendix is within normal limits. Small bowel and stomach are unremarkable. Vascular/Lymphatic: No significant vascular findings are present. No enlarged abdominal or pelvic lymph nodes. Reproductive: Prostate is unremarkable. Other: No abdominal wall hernia or abnormality. No abdominopelvic ascites. Musculoskeletal: No acute or significant osseous findings. IMPRESSION: Findings consistent with prior LVAD placement. There are no findings to suggest drive line infection or focal abscess. Inflammatory changes of the pancreas are noted consistent with acute pancreatitis. Correlate with laboratory values. No other focal abnormality is noted. Electronically Signed   By: Oneil Devonshire M.D.   On: 09/02/2023 00:42   DG Chest Port 1 View Result Date: 09/01/2023 CLINICAL DATA:  Heart failure EXAM: PORTABLE CHEST 1 VIEW COMPARISON:  02/26/2021 FINDINGS: Postoperative changes in the mediastinum. Left ventricular assist device is in place. Heart size and pulmonary vascularity are normal. Lungs are clear. No pleural effusion or pneumothorax. Mediastinal contours appear intact. No significant change since prior study. IMPRESSION: No active disease. Electronically Signed   By: Elsie Gravely M.D.   On: 09/01/2023 22:09     Medications:      Scheduled Medications:  Chlorhexidine  Gluconate Cloth  6 each Topical Daily   enoxaparin  (LOVENOX ) injection  30 mg Subcutaneous Daily   feeding supplement  1 Container Oral TID BM   folic acid   1 mg Intravenous Daily   thiamine  (VITAMIN B1) injection  100 mg Intravenous Daily    Infusions:  ceFEPime  (MAXIPIME ) IV Stopped (09/02/23 1657)   potassium chloride  100 mL/hr at 09/03/23 0700   vancomycin  Stopped (09/02/23 1939)    PRN Medications: acetaminophen , ondansetron  (ZOFRAN ) IV, mouth rinse  Patient Profile   Jon Martin is a 38 y.o. male with severe HFrEF d/t NICM s/p HM3 LVAD implantation and polysubstance abuse (cocaine and ETOH). Transferred from [place] with syncope and sepsis.   Assessment/Plan:   Syncope/Hypotension - In the setting of hypotension and hematemesis with nausea/vomiting/poor po intake for the last 2 days.  - concern for septic shock in the setting of driveline infection. WBC 19>15.8>13 and Hgb 12.5>11.5>10.9.  MAPs stable. - LR 75 cc/hr x 10 hrs complete. Will continue gentle rehydration for another 20 hrs (1.5L) => completed.  - Continue IV Vancomycin /Cefepime . ID consulted yesterday. Plan to deescalate abx today. Plan to get him back on cefadroxil .   - CT A/P consistent with acute pancreatitis - Procalcitonin WNL, blood cultures NGTD, lactic acid 1.4   2. Acute on Chronic systolic HF due to severe NICM s/p HM3 LVAD. - Echo 1/23 EF 40-45% w mildly reduced RV function with 3.5 lpm of flow - HM3 VAD implant 02/26/19 by Dr. Fleeta Brooke - Echo 7/29: EF 40-45%, RV not well visualized, trivial MR - He looks dry on exam, suspect hypovolemia with nausea/vomiting/poor po intake and AKI.   - Hold diuresis with concern for infection   3. Recurrent MSSA DL infection - Initial episode 3/22 - Recurrent infection 2/23 requiring debridement and wound vac.  - ID seen 4/25 and driveline site culture + for few E. coli, P. milrabilis, and E. Faecalis - Treated again  with IV abx in 5/25 admission (ampicillin ) - Blood cultures, procalcitonin - IV vanc/cefepime  - CT abdomen/pelvis as above.    4. VAD - Admit 12/21 with multiple low flow alarms. Rising LDH and decreasing PLTs. Suspected outflow graft kink occlusion. Patient placed on his left side and resolved. No occlusion seen on CT scan.  - VAD interrogated personally. 9 PI events today but no low flows or speed drops.  - CTA/P: no findings to suggest infection/ abscess.  - LDH stable.  - Remains off warfarin due to labile INRs (has not had any anticoagulation recently).     4. HTN - MAP low at outside hospital. They have been stable here. 70s-80s   5. Elevated LFTs - Mild elevation, follow.    6. Weight loss -> protein-calorie malnutrition - has struggled with poor diet since his mother passed - continue to monitor, concern for FTT   7. AKI - AKI in setting of hypotension/syncope from nausea/vomiting/poor po intake.  - Resolved with gentle IV hydration   8. Cocaine abuse - UDS + cocaine and THC.   9. Acute pancreatitis ETOH abuse - noted on CT A/P  - Got 1.5L yesterday. IVF now off. SCr normalized. PO intake improving, will hold off on further IV hydration - Denies abd pain and nausea today. Continue PRN Zofran  - Continue clear liquid diet. Consider advancing later today vs tomorrow.  - Continue Vanc/Cefepime  - Amylase/lipase elevated 316/422 - tBili 2.6>1.1 - RUQ US  7/29 with distended gallbladder with some sludge. No stones or ductal dilatation.  - Continue thiamine  and folate. Monitor for CIWA needs  Plan to transfer out of ICU today. Needs to mobilize. Continue working with PT.   I reviewed the LVAD parameters from today, and compared the results to the patient's prior recorded data.  No programming changes were made.  The LVAD is functioning within specified parameters.  The patient performs LVAD self-test daily.  LVAD interrogation was negative for any significant power changes,  alarms or PI events/speed drops.  LVAD equipment check completed and  is in good working order.  Back-up equipment present.   LVAD education done on emergency procedures and precautions and reviewed exit site care.  Length of Stay: 2  Beckey LITTIE Coe, NP 09/03/2023, 7:37 AM  VAD Team --- VAD ISSUES ONLY--- Pager [phone] (7am - 7am)  Advanced Heart Failure Team  Pager [phone] (M-F; 7a - 5p)  Please contact [facility] for night-coverage after hours (5p -7a ) and weekends on [email or web address]  Patient seen with NP, agree with the above note.   No abdominal pain, nausea, vomiting.  RUQ US  showed no gallstones or bile duct dilatation.    LVAD parameters stable.   General: Well appearing this am. NAD.  HEENT: Normal. Neck: Supple, JVP 7-8 cm. Carotids OK.  Cardiac:  Mechanical heart sounds with LVAD hum present.  Lungs:  CTAB, normal effort.  Abdomen:  NT, ND, no HSM. No bruits or masses. +BS  LVAD exit site: Well-healed and incorporated. Dressing dry and intact. No erythema or drainage. Stabilization device present and accurately applied. Driveline dressing changed daily per sterile technique. Extremities:  Warm and dry. No cyanosis, clubbing, rash, or edema.  Neuro:  Alert & oriented x 3. Cranial nerves grossly intact. Moves all 4 extremities w/o difficulty. Affect pleasant    Acute pancreatitis:  I think we can hold off on further IV fluid at this point and advance diet to full liquid given no abdominal pain/N/V.  Repeat lipase in am. Cause of pancreatitis uncertain, no gallstones.  He denies heavy ETOH.  UDS with cocaine and THC which are rare causes of acute pancreatitis.  Will check lipids to make sure triglycerides not markedly high.  He needs to avoid ETOH, cocaine, THC in the future => discussed with him today.   Can stop IV antibiotics and restart cefadroxil .   He has not been anticoagulated at home.  On prophylactic Lovenox  here.   OK for 2C, hopefully home tomorrow.   Ezra Shuck 09/03/2023 9:50 AM

## 2023-09-03 NOTE — Evaluation (Addendum)
 Occupational Therapy Evaluation Patient Details Name: Jon Martin MRN: [id number] DOB: May 06, 1985 Today's Date: 09/03/2023   History of Present Illness   38 y/o M admitted with nausea/vomiting and pain at his driveline site starting 7/27.  Pt presented to [facility] ED in [place] 09/01/23 with syncope after several episodes of hematemesis and was transferred to [facility]. Positive for driveline infection/sepsis.  Pt also with pancreatitis due to ETOH use.   PMH: severe HFrEF d/t NICM s/p HM3 LVAD implantation and polysubstance abuse (cocaine and ETOH), Underwent HM-3 LVAD placement on 02/26/19 by Dr. 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 Pt's brother provides transportation. Pt currently performing simulated ADLs without assist, ind with bed mobility and supervision for transfers with lines. Pt declines ambulation in room/hall but is able to take side steps at EOB. Pt presenting with impairments listed below, will follow acutely. Anticipate no OT follow up needs at d/c.   PS 5400 PF 3.8 PP 3.9 PI 5.4 No alarming of system noted during session     If plan is discharge home, recommend the following:   Assist for transportation;Assistance with cooking/housework     Functional Status Assessment   Patient has had a recent decline in their functional status and demonstrates the ability to make significant improvements in function in a reasonable and predictable amount of time.     Equipment Recommendations   None recommended by OT     Recommendations for Other Services         Precautions/Restrictions   Precautions Precautions: Fall Precaution/Restrictions Comments: LVAD Restrictions Weight Bearing Restrictions Per Provider Order: No     Mobility Bed Mobility Overal bed mobility: Independent                   Transfers Overall transfer level: Needs assistance Equipment used: None Transfers: Sit to/from Stand Sit to Stand: Supervision           General transfer comment: assist for lines      Balance Overall balance assessment: Needs assistance Sitting-balance support: No upper extremity supported, Feet supported Sitting balance-Leahy Scale: Good     Standing balance support: No upper extremity supported, During functional activity Standing balance-Leahy Scale: Good                             ADL either performed or assessed with clinical judgement   ADL Overall ADL's : At baseline                                             Vision   Vision Assessment?: No apparent visual deficits     Perception Perception: Not tested       Praxis Praxis: Not tested       Pertinent Vitals/Pain Pain Assessment Pain Assessment: No/denies pain     Extremity/Trunk Assessment Upper Extremity Assessment Upper Extremity Assessment: Overall WFL for tasks assessed   Lower Extremity Assessment Lower Extremity Assessment: Defer to PT evaluation   Cervical / Trunk Assessment Cervical / Trunk Assessment: Normal   Communication Communication Communication: No apparent difficulties   Cognition Arousal: Alert Behavior During Therapy: Flat affect Cognition: No apparent impairments  Following commands: Intact       Cueing  General Comments      VSS on RA   Exercises     Shoulder Instructions      Home Living Family/patient expects to be discharged to:: Private residence Living Arrangements: Other relatives Available Help at Discharge: Family;Available 24 hours/day Type of Home: House Home Access: Stairs to enter [employer] of Steps: 3 Entrance Stairs-Rails: None Home Layout: One level;Laundry or work area in basement     [employer] Shower/Tub: Scientist, clinical (histocompatibility and immunogenetics): None          Prior Functioning/Environment Prior Level of Function : Independent/Modified Independent             Mobility Comments: no device and walked without assist per pt ADLs Comments: ind iwth ADLs, brother provides transportation    OT Problem List: Decreased activity tolerance   OT Treatment/Interventions: Therapeutic exercise;Self-care/ADL training;Energy conservation;DME and/or AE instruction;Therapeutic activities;Balance training;Patient/family education      OT Goals(Current goals can be found in the care plan section)   Acute Rehab OT Goals Patient Stated Goal: none stated OT Goal Formulation: With patient Time For Goal Achievement: 09/17/23 Potential to Achieve Goals: Good ADL Goals Pt Will Perform Grooming: Independently;standing Pt Will Perform Upper Body Dressing: Independently;sitting;standing Pt Will Perform Lower Body Dressing: Independently;sitting/lateral leans;sit to/from stand Additional ADL Goal #1: pt will tolerate OOB standing activity x10 min in order to improve activity tolerance for ADLs   OT Frequency:  Min 1X/week    Co-evaluation              AM-PAC OT 6 Clicks Daily Activity     Outcome Measure Help from another person eating meals?: None Help from another person taking care of personal grooming?: None Help from another person toileting, which includes using toliet, bedpan, or urinal?: None Help from another person bathing (including washing, rinsing, drying)?: None Help from another person to put on and taking off regular upper body clothing?: None Help from another person to put on and taking off regular lower body clothing?: None 6 Click Score: 24   End of Session Nurse Communication: Mobility status  Activity Tolerance: Patient tolerated treatment well Patient left: in bed;with call bell/phone within reach  OT Visit Diagnosis: Other abnormalities of gait and mobility  (R26.89);Unsteadiness on feet (R26.81);Muscle weakness (generalized) (M62.81)                Time: 8847-8793 OT Time Calculation (min): 14 min Charges:  OT General Charges $OT Visit: 1 Visit OT Evaluation $OT Eval Low Complexity: 1 Low  Jon POUR, OTD, OTR/L SecureChat Preferred Acute Rehab [phone]   Jon Martin 09/03/2023, 12:38 PM

## 2023-09-03 NOTE — Discharge Instructions (Signed)

## 2023-09-03 NOTE — TOC CM/SW Note (Signed)
 CSW attached outpatient substance use treatment services resources to patients AVS.

## 2023-09-03 NOTE — Progress Notes (Signed)
 LVAD Coordinator Rounding Note:  Admitted 09/01/23 to Heart Failure service for syncope from [facility] ED.   HM III LVAD implanted on 02/26/19 by Dr. Obadiah under Destination Therapy criteria.  Patient presented to ER at [facility] in [place] with vomiting - pt states I woke up on the floor.  CT abd/pelvis 09/02/23 Findings consistent with prior LVAD placement. There are no findings to suggest drive line infection or focal abscess. Inflammatory changes of the pancreas are noted consistent with acute pancreatitis. Correlate with laboratory values. No other focal abnormality is noted.  Abd US  09/02/23 Distended gallbladder with some sludge. No shadowing stones or ductal dilatation. Mild fatty liver infiltration.   UDS 09/02/23  + Cocaine & marijuana    Pt in bed asleep on my arrival. Episode of vomiting this morning per bedside nurse- no hematemesis noted. Full liquid diet for now.   CT scan showed acute pancreatitis. Currently on IV Vancomycin  & Cefepime . Plan to transition back to PO Cefadroxil .    Pt has hx of drive line infection. Driveline is unremarkable. Pt is on chronic Cefadroxil . ID consulted. Continue chronic Cefadroxil  at discharge.   Vital signs: Temp: 98.3 HR: 88 Doppler Pressure: 84 Automatic BP: 108/83 (92) O2 Sat: 100% RA Wt: 108.6>117.7 ?  lbs  LVAD interrogation reveals:  Speed: 5400 Flow: 3.5 Power: 3.8w PI: 6.9 Hct: 20 - DO NOT CHANGE   Alarms: none Events: none  Fixed speed: 5400 Low speed limit: 5100  Drive Line: Existing VAD dressing clean, dry, and intact. Drive line anchor correctly applied. Continue weekly dressing changes using the daily kit. Next dressing change 09/09/23 by bedside nurse.   Labs:  LDH trend: 189>204  Hgb trend: 10.9>9.7  Creatinine trend: 3.1>2.32>1.14  Anticoagulation Plan: -INR Goal: not on coumadin  -ASA Dose: none  Blood Products:  09/01/23: 1u/PRBC at [facility]   Device: N/A  Infection:  09/01/23 Blood cultures >>  performed at [facility] pending 09/01/23 Blood cultures at [facility]>>no growth < 12 hrs  Drips:    Plan/Recommendations:  1. Call VAD Coordinator if any VAD equipment or drive line issues. 2. Driveline dressing changes weekly per bedside nurse.   Isaiah Knoll RN VAD Coordinator  Office: [phone]  24/7 Pager: [phone]

## 2023-09-03 NOTE — TOC Initial Note (Signed)
 Transition of Care [facility]) - Initial/Assessment Note    Patient Details  Name: Jon Martin MRN: [id number] Date of Birth: May 18, 1985  Transition of Care [facility]) CM/SW Contact:    Justina Delcia Czar, RN Phone Number: [phone] 09/03/2023, 7:33 AM  Clinical Narrative:                  HF IP CM spoke to pt and he lives at home with brother. Reports he is independent at home. Uses Medicaid transportation to get to appts.   Will continue to follow for dc needs.    Expected Discharge Plan: Home/Self Care Barriers to Discharge: Continued Medical Work up   Patient Goals and CMS Choice Patient states their goals for this hospitalization and ongoing recovery are:: wants to get better          Expected Discharge Plan and Services   Discharge Planning Services: CM Consult   Living arrangements for the past 2 months: Single Family Home                                      Prior Living Arrangements/Services Living arrangements for the past 2 months: Single Family Home Lives with:: Siblings Patient language and need for interpreter reviewed:: Yes Do you feel safe going back to the place where you live?: Yes      Need for Family Participation in Patient Care: No (Comment) Care giver support system in place?: Yes (comment) Current home services: DME    Activities of Daily Living      Permission Sought/Granted Permission sought to share information with : Case Manager, PCP, Family Supports Permission granted to share information with : Yes, Verbal Permission Granted              Emotional Assessment Appearance:: Appears stated age Attitude/Demeanor/Rapport: Engaged Affect (typically observed): Accepting Orientation: : Oriented to Self, Oriented to Place, Oriented to  Time, Oriented to Situation   Psych Involvement: No (comment)  Admission diagnosis:  LVAD (left ventricular assist device) present (HCC) [Z95.811] Sepsis (HCC) [A41.9] Syncope [R55] Patient  Active Problem List   Diagnosis Date Noted   Pancreatitis 09/02/2023   Syncope 09/01/2023   Anemia 07/07/2023   Coffee ground emesis 07/06/2023   GI bleed 07/04/2023   Malnutrition of moderate degree 05/23/2020   MSSA (methicillin susceptible Staphylococcus aureus) infection    Loss of teeth due to extraction    Retained dental root    Chronic periodontitis    Complication involving left ventricular assist device (LVAD) 04/28/2020   Dental caries 04/28/2020   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/2020   Left ventricular assist device (LVAD) complication 01/12/2020   Dehydration, moderate 08/02/2019   Chronic systolic heart failure (HCC) 08/02/2019   AKI (acute kidney injury) (HCC) 08/02/2019   Nausea and vomiting in adult 08/02/2019   LVAD (left ventricular assist device) present (HCC) 08/02/2019   Hypokalemia 08/02/2019   Congestive heart failure (HCC)    Cardiogenic shock (HCC)    Goals of care, counseling/discussion    Protein-calorie malnutrition, severe 02/11/2019   Dilated cardiomyopathy (HCC) 02/10/2019   PCP:  Merrell Delanna BROCKS, MD Pharmacy:   [facility] [address], [place] - [address] [place] [address] Phone: [phone] Fax: [phone]  [facility] [address] [place], [place] - [address]  [place] [address] Phone: [phone] Fax: [phone]  [facility] [address] [place] [address] Phone: [phone] Fax: [phone]     Social Drivers of Health (SDOH) Social History: SDOH Screenings   Food Insecurity: No Food Insecurity (07/06/2023)  Housing: Low Risk  (07/06/2023)  Transportation Needs: No Transportation Needs (07/06/2023)  Utilities: Not At Risk (07/06/2023)  Alcohol Screen: Medium Risk (03/05/2019)  Depression (PHQ2-9): Low Risk  (02/22/2019)  Financial Resource Strain: High Risk (07/04/2022)   Physical Activity: Sufficiently Active (03/05/2019)  Social Connections: Unknown (07/06/2023)  Tobacco Use: High Risk (09/01/2023)   Received from [facility]   SDOH Interventions:     Readmission Risk Interventions    07/09/2023    3:43 PM  Readmission Risk Prevention Plan  Post Dischage Appt Complete  Medication Screening Complete  Transportation Screening Complete

## 2023-09-04 ENCOUNTER — Other Ambulatory Visit: Payer: Self-pay

## 2023-09-04 ENCOUNTER — Encounter (HOSPITAL_COMMUNITY): Payer: Self-pay | Admitting: Cardiology

## 2023-09-04 DIAGNOSIS — I429 Cardiomyopathy, unspecified: Secondary | ICD-10-CM | POA: Diagnosis not present

## 2023-09-04 DIAGNOSIS — I5022 Chronic systolic (congestive) heart failure: Secondary | ICD-10-CM | POA: Diagnosis not present

## 2023-09-04 DIAGNOSIS — K8500 Idiopathic acute pancreatitis without necrosis or infection: Secondary | ICD-10-CM | POA: Diagnosis not present

## 2023-09-04 DIAGNOSIS — Z95811 Presence of heart assist device: Secondary | ICD-10-CM | POA: Diagnosis not present

## 2023-09-04 LAB — CBC
HCT: 28.7 % — ABNORMAL LOW (ref 39.0–52.0)
Hemoglobin: 9.9 g/dL — ABNORMAL LOW (ref 13.0–17.0)
MCH: 35 pg — ABNORMAL HIGH (ref 26.0–34.0)
MCHC: 34.5 g/dL (ref 30.0–36.0)
MCV: 101.4 fL — ABNORMAL HIGH (ref 80.0–100.0)
Platelets: 66 K/uL — ABNORMAL LOW (ref 150–400)
RBC: 2.83 MIL/uL — ABNORMAL LOW (ref 4.22–5.81)
RDW: 15.7 % — ABNORMAL HIGH (ref 11.5–15.5)
WBC: 5.5 K/uL (ref 4.0–10.5)
nRBC: 0.4 % — ABNORMAL HIGH (ref 0.0–0.2)

## 2023-09-04 LAB — PHOSPHORUS: Phosphorus: 1.8 mg/dL — ABNORMAL LOW (ref 2.5–4.6)

## 2023-09-04 LAB — COMPREHENSIVE METABOLIC PANEL WITH GFR
ALT: 43 U/L (ref 0–44)
AST: 116 U/L — ABNORMAL HIGH (ref 15–41)
Albumin: 2.9 g/dL — ABNORMAL LOW (ref 3.5–5.0)
Alkaline Phosphatase: 48 U/L (ref 38–126)
Anion gap: 8 (ref 5–15)
BUN: 7 mg/dL (ref 6–20)
CO2: 24 mmol/L (ref 22–32)
Calcium: 8.6 mg/dL — ABNORMAL LOW (ref 8.9–10.3)
Chloride: 104 mmol/L (ref 98–111)
Creatinine, Ser: 0.78 mg/dL (ref 0.61–1.24)
GFR, Estimated: >60 mL/min (ref >60)
Glucose, Bld: 119 mg/dL — ABNORMAL HIGH (ref 70–99)
Potassium: 3.9 mmol/L (ref 3.5–5.1)
Sodium: 136 mmol/L (ref 135–145)
Total Bilirubin: 1.1 mg/dL (ref 0.0–1.2)
Total Protein: 5.7 g/dL — ABNORMAL LOW (ref 6.5–8.1)

## 2023-09-04 LAB — LIPASE, BLOOD: Lipase: 297 U/L — ABNORMAL HIGH (ref 11–51)

## 2023-09-04 LAB — LACTATE DEHYDROGENASE: LDH: 209 U/L — ABNORMAL HIGH (ref 98–192)

## 2023-09-04 MED ORDER — POTASSIUM PHOSPHATES 15 MMOLE/5ML IV SOLN
15.0000 mmol | Freq: Once | INTRAVENOUS | Status: AC
  Administered 2023-09-04: 15 mmol via INTRAVENOUS
  Filled 2023-09-04: qty 5

## 2023-09-04 NOTE — Progress Notes (Signed)
 Physical Therapy Treatment Patient Details Name: Jon Martin MRN: [id number] DOB: Oct 29, 1985 Today's Date: 09/04/2023   History of Present Illness 38 y/o M admitted with nausea/vomiting and pain at his driveline site starting 7/27.  Pt presented to [facility] ED in [place] 09/01/23 with syncope after several episodes of hematemesis and was transferred to [facility]. Positive for driveline infection/sepsis.  Pt also with pancreatitis due to ETOH use.   PMH: severe HFrEF d/t NICM s/p HM3 LVAD implantation and polysubstance abuse (cocaine and ETOH), Underwent HM-3 LVAD placement on 02/26/19 by Dr. 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 Ambulating around unit at mod I level without LOB. DGI indicates low fall risk. HR to 120s briefly but mostly in the 110s. Good pace. No SOB or dizziness. All questions answered. Eager to go home. Adequate for d/c from mobility standpoint when medically ready. Will follow acutely for any changes.    If plan is discharge home, recommend the following: Assist for transportation   Can travel by private vehicle        Equipment Recommendations  None recommended by PT    Recommendations for Other Services       Precautions / Restrictions Precautions Precautions: Fall Recall of Precautions/Restrictions: Intact Precaution/Restrictions Comments: LVAD Restrictions Weight Bearing Restrictions Per Provider Order: No     Mobility  Bed Mobility Overal bed mobility: Independent                  Transfers Overall transfer level: Modified independent Equipment used: None               General transfer comment: Mod I to rise, good control, assisted with lines only.    Ambulation/Gait Ambulation/Gait assistance: Modified independent (Device/Increase time) Gait Distance (Feet): 700 Feet Assistive device: None Gait Pattern/deviations: WFL(Within Functional Limits) Gait velocity: Grossly stable  without overt LOB. Cues for direction around unit and safety with quick pace. Tolerated dynamic challenges without intervention. Gait velocity interpretation: >4.37 ft/sec, indicative of normal walking speed       Stairs             Wheelchair Mobility     Tilt Bed    Modified Rankin (Stroke Patients Only)       Balance Overall balance assessment: Needs assistance Sitting-balance support: No upper extremity supported, Feet supported Sitting balance-Leahy Scale: Normal     Standing balance support: No upper extremity supported, During functional activity Standing balance-Leahy Scale: Good                   Standardized Balance Assessment Standardized Balance Assessment : Dynamic Gait Index   Dynamic Gait Index Level Surface: Normal Change in Gait Speed: Normal Gait with Horizontal Head Turns: Mild Impairment Gait with Vertical Head Turns: Normal Gait and Pivot Turn: Normal Step Over Obstacle: Mild Impairment Step Around Obstacles: Normal Steps: Mild Impairment Total Score: 21      Communication Communication Communication: No apparent difficulties  Cognition Arousal: Alert Behavior During Therapy: WFL for tasks assessed/performed   PT - Cognitive impairments: No apparent impairments                         Following commands: Intact      Cueing Cueing Techniques: Verbal cues  Exercises      General Comments General comments (skin integrity, edema, etc.): HR to 120s.      Pertinent Vitals/Pain Pain Assessment Pain Assessment:  No/denies pain    Home Living Family/patient expects to be discharged to:: Private residence                        Prior Function            PT Goals (current goals can now be found in the care plan section) Acute Rehab PT Goals Patient Stated Goal: to go home PT Goal Formulation: With patient Time For Goal Achievement: 09/16/23 Potential to Achieve Goals: Good Progress towards PT  goals: Progressing toward goals    Frequency    Min 2X/week      PT Plan      Co-evaluation              AM-PAC PT 6 Clicks Mobility   Outcome Measure  Help needed turning from your back to your side while in a flat bed without using bedrails?: None Help needed moving from lying on your back to sitting on the side of a flat bed without using bedrails?: None Help needed moving to and from a bed to a chair (including a wheelchair)?: None Help needed standing up from a chair using your arms (e.g., wheelchair or bedside chair)?: None Help needed to walk in hospital room?: None Help needed climbing 3-5 steps with a railing? : None 6 Click Score: 24    End of Session Equipment Utilized During Treatment:  (LVAD kit) Activity Tolerance: Patient tolerated treatment well Patient left: in bed;with bed alarm set;with call bell/phone within reach;with nursing/sitter in room Nurse Communication: Mobility status PT Visit Diagnosis: Muscle weakness (generalized) (M62.81)     Time: 8764-8750 PT Time Calculation (min) (ACUTE ONLY): 14 min  Charges:    $Therapeutic Activity: 8-22 mins PT General Charges $$ ACUTE PT VISIT: 1 Visit                     Leontine Roads, PT, DPT [facility]  Rehabilitation Services Physical Therapist Office: [phone] Website: [email or web address]    Leontine GORMAN Roads 09/04/2023, 1:39 PM

## 2023-09-04 NOTE — Progress Notes (Signed)
 LVAD Coordinator Rounding Note:  Admitted 09/01/23 to Heart Failure service for syncope from [facility] ED.   HM III LVAD implanted on 02/26/19 by Dr. Obadiah under Destination Therapy criteria.  Patient presented to ER at [facility] in [place] with vomiting - pt states I woke up on the floor.  CT abd/pelvis 09/02/23 Findings consistent with prior LVAD placement. There are no findings to suggest drive line infection or focal abscess. Inflammatory changes of the pancreas are noted consistent with acute pancreatitis. Correlate with laboratory values. No other focal abnormality is noted.  Abd US  09/02/23 Distended gallbladder with some sludge. No shadowing stones or ductal dilatation. Mild fatty liver infiltration.   UDS 09/02/23  + Cocaine & marijuana  Pt in bed asleep on my arrival. Pt diet has been advance to heart healthy. VAD coordinator had discussion with pt about THC and cocaine use. Pt tells me that they are dipping the joint in cocaine before smoking it. I had a lengthy conversation with the pt about this and the risk involved with his illicit behavior along with repercussions.  CT scan showed acute pancreatitis. Currently on IV Vancomycin  & Cefepime . Plan to transition back to PO Cefadroxil .    Pt has hx of drive line infection. Driveline is unremarkable. Pt is on chronic Cefadroxil . ID consulted. Continue chronic Cefadroxil  at discharge.   Vital signs: Temp: 97.2 HR: 85 Doppler Pressure: 82 Automatic BP: 94/80 (86) O2 Sat: 100% RA Wt: 108.6>117.7 lbs  LVAD interrogation reveals:  Speed: 5400 Flow: 3.6 Power: 3.8w PI: 6.3 Hct: 20 - DO NOT CHANGE   Alarms: none Events: 8 today  Fixed speed: 5400 Low speed limit: 5100  Drive Line: Existing VAD dressing clean, dry, and intact. Drive line anchor correctly applied. Continue weekly dressing changes using the daily kit. Next dressing change 09/09/23 by bedside nurse.   Labs:  LDH trend: 189>204>209  Hgb trend:  10.9>9.7>9.9  Creatinine trend: 3.1>2.32>1.14>0.78  Anticoagulation Plan: -INR Goal: not on coumadin  -ASA Dose: none  Blood Products:  09/01/23: 1u/PRBC at [facility]   Device: N/A  Infection:  09/01/23 Blood cultures >> performed at [facility] pending 09/01/23 Blood cultures at [facility]>>no growth < 2 days  Drips:    Plan/Recommendations:  1. Call VAD Coordinator if any VAD equipment or drive line issues. 2. Driveline dressing changes weekly per bedside nurse.   Lauraine Ip RN VAD Coordinator  Office: [phone]  24/7 Pager: [phone]

## 2023-09-04 NOTE — Progress Notes (Addendum)
 Advanced Heart Failure VAD Team Note  PCP-Cardiologist: None  Chief Complaint: Syncope / Acute Pancreatitis Subjective:   7/28: Admitted post syncopal episode. Transferred to [facility]  CTA/P consistent with acute pancreatitis. Reports drinking 1-2 ETOH drinks a day. UDS + cocaine and THS.   ID saw, now off vanc/cefepime . Back on home cefadroxil .   Got IV rehydration for 2 days. SCr now WNL.   Feels good this morning. Denies CP/SOB. Tolerating full liquid diet.   LVAD INTERROGATION:  HeartMate II LVAD:   Flow 3.7 liters/min, speed 5400, power 3.8, PI 5.6. No PI events  Objective:    Vital Signs:   Temp:  [97.9 F (36.6 C)-98.4 F (36.9 C)] 98.1 F (36.7 C) (07/31 0700) Pulse Rate:  [75-246] 78 (07/31 0600) Resp:  [4-25] 13 (07/31 0600) BP: (83-108)/(65-89) 97/69 (07/31 0600) SpO2:  [97 %-100 %] 98 % (07/31 0600) Last BM Date : 09/03/23 Mean arterial Pressure 70s-80s  Intake/Output:   Intake/Output Summary (Last 24 hours) at 09/04/2023 0751 Last data filed at 09/04/2023 0500 Gross per 24 hour  Intake 375.12 ml  Output 975 ml  Net -599.88 ml     Physical Exam  General:  Well appearing. No resp difficulty Neck:  JVP flat Cor: Mechanical heart sounds with LVAD hum present. Lungs: Clear Driveline: C/D/I; securement device intact and driveline incorporated Extremities: no edema Neuro: alert & oriented x3. Affect pleasant  Telemetry   NSR 80s-90s intt PVCs (Personally reviewed)    Labs   Basic Metabolic Panel: Recent Labs  Lab 09/01/23 2135 09/02/23 0435 09/03/23 0254 09/03/23 0307 09/04/23 0229  NA 146* 143 142  --  136  K 4.3 3.4* 3.2*  --  3.9  CL 107 106 110  --  104  CO2 14* 17* 23  --  24  GLUCOSE 133* 107* 117*  --  119*  BUN 26* 24* 17  --  7  CREATININE 2.86* 2.32* 1.14  --  0.78  CALCIUM 8.0* 8.2* 8.7*  --  8.6*  MG 1.9  --   --   --   --   PHOS  --   --   --  1.3*  --     Liver Function Tests: Recent Labs  Lab 09/01/23 2135  09/03/23 0254 09/04/23 0229  AST 75* 108* 116*  ALT 41 36 43  ALKPHOS 65 43 48  BILITOT 2.6* 1.1 1.1  PROT 7.4 5.5* 5.7*  ALBUMIN  3.9 2.9* 2.9*   Recent Labs  Lab 09/02/23 0842 09/04/23 0229  LIPASE 422* 297*  AMYLASE 316*  --    No results for input(s): AMMONIA in the last 168 hours.  CBC: Recent Labs  Lab 09/01/23 2135 09/02/23 0435 09/03/23 0254 09/04/23 0229  WBC 15.8* 13.0* 6.9 5.5  NEUTROABS 13.6*  --   --   --   HGB 11.5* 10.9* 9.7* 9.9*  HCT 34.4* 31.3* 28.2* 28.7*  MCV 103.6* 101.0* 101.8* 101.4*  PLT 111* 104* 76* 66*    INR: Recent Labs  Lab 09/02/23 0435  INR 0.9    Other results: EKG:    Imaging   US  Abdomen Limited RUQ (LIVER/GB) Result Date: 09/02/2023 CLINICAL DATA:  Pancreatitis EXAM: ULTRASOUND ABDOMEN LIMITED RIGHT UPPER QUADRANT COMPARISON:  Noncontrast CT 09/02/2023.  Ultrasound 10/02/2021 FINDINGS: Gallbladder: Distended gallbladder with some layering sludge. No shadowing stones. No wall thickening or adjacent fluid. No reported Murphy's sign. Common bile duct: Diameter: 2 mm Liver: Mildly echogenic hepatic parenchyma consistent with fatty liver infiltration.  Portal vein is patent on color Doppler imaging with normal direction of blood flow towards the liver. Other: None. IMPRESSION: Distended gallbladder with some sludge. No shadowing stones or ductal dilatation. Mild fatty liver infiltration. Electronically Signed   By: Ranell Bring M.D.   On: 09/02/2023 13:01   ECHOCARDIOGRAM COMPLETE Result Date: 09/02/2023    ECHOCARDIOGRAM REPORT   Patient Name:   AARYAV HOPFENSPERGER Date of Exam: 09/02/2023 Medical Rec #:  [id number]       Height:       65.0 in Accession #:    [id number]      Weight:       108.7 lb Date of Birth:  1985-03-06        BSA:          1.526 m Patient Age:    37 years        BP:           81/60 mmHg Patient Gender: M               HR:           85 bpm. Exam Location:  Inpatient Procedure: 2D Echo, Color Doppler and Cardiac Doppler  (Both Spectral and Color            Flow Doppler were utilized during procedure). Indications:    CHF I50.9  History:        Patient has prior history of Echocardiogram examinations, most                 recent 02/27/2021.  Sonographer:    Tinnie Gosling RDCS Referring Phys: [id number] [place] LEE IMPRESSIONS  1. LVAD- parameters not entered. Study assumes: Flow 3.9 liters/min, speed 5400, power 4.1, PI 5.2. Neutral Septum.. Left ventricular ejection fraction, by estimation, is 40 to 45%. The left ventricle has mildly decreased function. The left ventricle demonstrates global hypokinesis. Left ventricular diastolic parameters are indeterminate.  2. Right ventricular systolic function was not well visualized. The right ventricular size is not well visualized.  3. The mitral valve is grossly normal. Trivial mitral valve regurgitation. No evidence of mitral stenosis.  4. Aortic valve opens consistently. The aortic valve is tricuspid. Aortic valve regurgitation is mild. No aortic stenosis is present. Comparison(s): Prior images reviewed side by side. Mitral regurgitation has improved. Aortic regurgitation is new. This study is technically difficult. FINDINGS  Left Ventricle: LVAD- parameters not entered. Study assumes: Flow 3.9 liters/min, speed 5400, power 4.1, PI 5.2. Neutral Septum. Left ventricular ejection fraction, by estimation, is 40 to 45%. The left ventricle has mildly decreased function. The left ventricle demonstrates global hypokinesis. The left ventricular internal cavity size was normal in size. There is no left ventricular hypertrophy. Left ventricular diastolic parameters are indeterminate. Right Ventricle: The right ventricular size is not well visualized. No increase in right ventricular wall thickness. Right ventricular systolic function was not well visualized. Left Atrium: Left atrial size was not well visualized. Right Atrium: Right atrial size was not well visualized. Pericardium: There is no evidence  of pericardial effusion. Mitral Valve: The mitral valve is grossly normal. Trivial mitral valve regurgitation. No evidence of mitral valve stenosis. Tricuspid Valve: The tricuspid valve is normal in structure. Tricuspid valve regurgitation is not demonstrated. No evidence of tricuspid stenosis. Aortic Valve: Aortic valve opens consistently. The aortic valve is tricuspid. Aortic valve regurgitation is mild. No aortic stenosis is present. Pulmonic Valve: The pulmonic valve was normal in structure. Pulmonic valve regurgitation is not visualized. No  evidence of pulmonic stenosis. Aorta: The aortic root and ascending aorta are structurally normal, with no evidence of dilitation. IAS/Shunts: The atrial septum is grossly normal.  LEFT VENTRICLE PLAX 2D LVIDd:         3.90 cm LVIDs:         3.40 cm LV PW:         0.70 cm LV IVS:        0.70 cm LVOT diam:     2.00 cm LVOT Area:     3.14 cm  LEFT ATRIUM         Index LA diam:    3.50 cm 2.29 cm/m   AORTA Ao Root diam: 3.10 cm Ao Asc diam:  3.30 cm  SHUNTS Systemic Diam: 2.00 cm Stanly Leavens MD Electronically signed by Stanly Leavens MD Signature Date/Time: 09/02/2023/10:45:21 AM    Final      Medications:     Scheduled Medications:  cefadroxil   500 mg Oral BID   Chlorhexidine  Gluconate Cloth  6 each Topical Daily   enoxaparin  (LOVENOX ) injection  30 mg Subcutaneous Daily   feeding supplement  1 Container Oral TID BM   folic acid   1 mg Oral Daily   multivitamin with minerals  1 tablet Oral Daily   thiamine   100 mg Oral Daily    Infusions:    PRN Medications: acetaminophen , ondansetron  (ZOFRAN ) IV, mouth rinse  Patient Profile   Diamante Rubin is a 38 y.o. male with severe HFrEF d/t NICM s/p HM3 LVAD implantation and polysubstance abuse (cocaine and ETOH). Transferred from [place] with syncope and sepsis.   Assessment/Plan:   Syncope/Hypotension - In the setting of hypotension and hematemesis with nausea/vomiting/poor po intake for the  last 2 days.  - concern for septic shock in the setting of driveline infection.  - WBC 19>15.8>13>5.5 and Hgb 12.5>11.5>10.9>9.9.  MAPs stable. - Got 2250L gentle rehydration. SCr back to baseline.  - Continue IV Vancomycin /Cefepime . ID consulted yesterday. Plan to deescalate abx today. Plan to get him back on cefadroxil .   - CT A/P consistent with acute pancreatitis - Procalcitonin WNL, blood cultures NGTD, lactic acid 1.4   2. Acute on Chronic systolic HF due to severe NICM s/p HM3 LVAD. - Echo 1/23 EF 40-45% w mildly reduced RV function with 3.5 lpm of flow - HM3 VAD implant 02/26/19 by Dr. Fleeta Brooke - Echo 7/29: EF 40-45%, RV not well visualized, trivial MR - Looks euvolemic on exam now. Suspect initially hypovolemic with nausea/vomiting/poor po intake and AKI.   - Does not appear to need additional diuretics at this time.    3. Recurrent MSSA DL infection - Initial episode 3/22 - Recurrent infection 2/23 requiring debridement and wound vac.  - ID saw 4/25 and driveline site culture + for few E. coli, P. milrabilis, and E. Faecalis - Treated again with IV abx in 5/25 admission (ampicillin ) - ID consulted this admission. Now off vanc/cefepime  and back on cefadroxil  500 mg PO - CT abdomen/pelvis as above.    4. VAD - Admit 12/21 with multiple low flow alarms. Rising LDH and decreasing PLTs. Suspected outflow graft kink occlusion. Patient placed on his left side and resolved. No occlusion seen on CT scan.  - VAD interrogated personally.  - CTA/P: no findings to suggest infection/ abscess.  - LDH stable.  - Remains off warfarin due to labile INRs (has not had any anticoagulation recently).     4. HTN - MAP low at outside hospital. They have  been stable here. 70s-80s   5. Elevated LFTs - Mild elevation, follow.    6. Weight loss -> protein-calorie malnutrition - has struggled with poor diet since his mother passed - continue to monitor, concern for FTT   7. AKI - AKI in  setting of hypotension/syncope from nausea/vomiting/poor po intake.  - Resolved with gentle IV hydration   8. Cocaine abuse - UDS + cocaine and THC.  - Cessation advised. TOC consulted for resources.   9. Acute pancreatitis ETOH abuse - noted on CT A/P  - Got 2250 LR this admission. IVF now off. SCr normalized. PO intake improving, will hold off on further IV hydration - Denies abd pain and nausea today. Continue PRN Zofran  - Advance full liquid diet to heart healthy. If he tolerates this well may be stable for discharge.  - Off Vanc/Cefepime . Back on cefadroxil   - Amylase/lipase elevated but down trending - tBili 2.6>1.1>1.1 - RUQ US  7/29 with distended gallbladder with some sludge. No stones or ductal dilatation.  - Continue thiamine  and folate.  Did not require CIWA this admission  10 Thrombocytopenia - Plt down to 66 today (111 on admission) - Will stop lovenox  today - Plan to follow 1 more day.  - HITT not suspected, only got 2 doses of lovenox   Stable for discharge once he's tolerating heart healthy diet and plts stabilize.   I reviewed the LVAD parameters from today, and compared the results to the patient's prior recorded data.  No programming changes were made.  The LVAD is functioning within specified parameters.  The patient performs LVAD self-test daily.  LVAD interrogation was negative for any significant power changes, alarms or PI events/speed drops.  LVAD equipment check completed and is in good working order.  Back-up equipment present.   LVAD education done on emergency procedures and precautions and reviewed exit site care.  Length of Stay: 3  Beckey LITTIE Coe, NP 09/04/2023, 7:51 AM  Patient seen with NP, I formulated the plan and agree with the above note.   No abdominal pain or nausea today, tolerated full liquid diet.  Lipase trending down.  His platelets continue to trend down, 66K today.   LVAD parameters stable, MAP 90.   General: Well appearing this am.  NAD.  HEENT: Normal. Neck: Supple, JVP 7-8 cm. Carotids OK.  Cardiac:  Mechanical heart sounds with LVAD hum present.  Lungs:  CTAB, normal effort.  Abdomen:  NT, ND, no HSM. No bruits or masses. +BS  LVAD exit site: Well-healed and incorporated. Dressing dry and intact. No erythema or drainage. Stabilization device present and accurately applied. Driveline dressing changed daily per sterile technique. Extremities:  Warm and dry. No cyanosis, clubbing, rash, or edema.  Neuro:  Alert & oriented x 3. Cranial nerves grossly intact. Moves all 4 extremities w/o difficulty. Affect pleasant    Acute pancreatitis:  Advance to regular diet.  Cause of pancreatitis uncertain, no gallstones.  He denies heavy ETOH.  UDS with cocaine and THC which are rare causes of acute pancreatitis.  Triglycerides not markedly high.  He needs to avoid ETOH, cocaine, THC in the future => discussed with him today.    We have restarted his chronic cefadroxil .    He has not been anticoagulated at home.  On prophylactic Lovenox  here.  Platelets continue to fall, down to 66K today.  This may be due to inflammation from pancreatitis but will stop Lovenox .   Home tomorrow as long as his platelets stabilize. Has orders  for 2C.    OK for 2C, hopefully home tomorrow.  VAD Team --- VAD ISSUES ONLY--- Pager [phone] (7am - 7am)  Advanced Heart Failure Team  Pager [phone] (M-F; 7a - 5p)  Please contact [facility] for night-coverage after hours (5p -7a ) and weekends on [email or web address]

## 2023-09-04 NOTE — Progress Notes (Signed)
 Brief Note:  Received 15 mmol IV potassium phosphate  yesterday for low phosphorus. Recheck this AM phosphorus 1.8 (up from 1.3 yesterday). Recommend further supplementation, discussed with Dr. Rolan who agrees. Ordered repeat phos for AM.  Betsey Finger MS, RDN, LDN, CNSC Registered Dietitian 3 Clinical Nutrition RD Inpatient Contact Info in Amion

## 2023-09-04 NOTE — Discharge Summary (Incomplete)
 Advanced Heart Failure Team  Discharge Summary   Patient ID: Jon Martin MRN: [id number], DOB/AGE: June 02, 1985 37 y.o. Admit date: 09/01/2023 D/C date:     09/05/2023   Primary Discharge Diagnoses:  Acute pancreatitis 2. ETOH abuse/cocaine abuse 3. AKI 4. Syncope/hypotension 5. HM III LVAD  Hospital Course:  Jon Martin is a 38 y.o. male with severe HFrEF d/t NICM s/p HM3 LVAD implantation and polysubstance abuse (cocaine and ETOH).  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  He was admitted again 5/25-6/25 with coffee grounds emesis and hypovolemia, UDS +cocaine. Possible Mallory-Weiss tear. EGD showed esophageal candidiasis, treated with Difucan. 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   He presented to [facility] ED 08/31/23 with syncope after several episodes of hematemesis. Was hypotensive prior to transfer. Found to have AKI, SCr 3.1 (baseline 0.8), 2/2 GI losses and decreased PO intake. Transferred to [facility] for further management and suspected sepsis.   Once here CTA/P was consistent with acute pancreatitis. Has emesis on admission but resolved after day 1 here. Treated with gentle IV hydration and clear liquid diet. Diet was able to be advanced and AKI resolved. He was briefly on IV Vanc + Cefepmine but transitioned to suppressive cefadroxil  prior to discharge.  Discharge briefly delayed with thrombocytopenia. Plts down to 66. Lovenox  stopped. Note he has not been anticoagulated at home. Will need CBC at f/u.  Day of discharge he had multiple PI events with PI up to 9. Suspect d/t poor po intake. Given 250 cc fluid bolus prior to discharge.  Close follow-up in VAD clinic arranged. HF TOC CSW/CM arranged ride home.    LVAD Interrogation HM III:   Speed: 5400 Flow: 3.8     PI: 5.3     Power:  4.0     Back-up speed: 5100   25 PI events this am, PI up to 9.    Discharge Weight: 114 lb Discharge Vitals: Blood pressure 103/82, pulse 89, temperature 97.9 F (36.6 C), temperature source Oral, resp. rate 18, height 5' 5 (1.651 m), weight 52 kg, SpO2 99%. Physical Exam: GENERAL: no acute distress. Thin. CARDIAC:  Mechanical heart sounds with LVAD hum present.  LUNGS:  Clear to auscultation bilaterally.  ABDOMEN:  Soft,nontender LVAD exit site:   Dressing dry and intact.  No erythema or drainage.   EXTREMITIES:  No edema  NEUROLOGIC:  Alert and oriented x 4.   Affect pleasant.      Labs: Lab Results  Component Value Date   WBC 5.2 09/05/2023   HGB 10.4 (L) 09/05/2023   HCT 30.5 (L) 09/05/2023   MCV 100.7 (H) 09/05/2023   PLT 67 (L) 09/05/2023    Recent Labs  Lab 09/05/23 0300  NA 138  K 3.8  CL 103  CO2 28  BUN 10  CREATININE 0.82  CALCIUM 8.9  PROT 5.8*  BILITOT 0.8  ALKPHOS 48  ALT 49*  AST 124*  GLUCOSE 106*   Lab Results  Component Value Date   CHOL 145 09/03/2023   HDL 50 09/03/2023   LDLCALC 47 09/03/2023   TRIG 240 (H) 09/03/2023   BNP (last 3 results) No results for input(s): BNP in the last 8760 hours.  ProBNP (last 3 results) No results for input(s): PROBNP in the last 8760 hours.   Diagnostic Studies/Procedures   No results found.  CT A/P w/o contrast 09/02/23: IMPRESSION: Findings consistent with prior LVAD placement. There  are no findings to suggest drive line infection or focal abscess.   Inflammatory changes of the pancreas are noted consistent with acute pancreatitis. Correlate with laboratory values.   No other focal abnormality is noted.  Discharge Medications   Allergies as of 09/05/2023   No Known Allergies      Medication List     STOP taking these medications    fluconazole  200 MG tablet Commonly known as: DIFLUCAN        TAKE these medications    cefadroxil  500 MG capsule Commonly known as: DURICEF Take 1 capsule (500 mg total) by mouth 2 (two) times daily.    Klor-Con  M20 20 MEQ tablet Generic drug: potassium chloride  SA TAKE 2 TABLETS (40 MEQ) BY MOUTH DAILY, OR AS DIRECTED BY HEART FAILURE CLINIC What changed: See the new instructions.   multivitamin with minerals Tabs tablet Take 1 tablet by mouth daily. Start taking on: September 06, 2023   pantoprazole  40 MG tablet Commonly known as: PROTONIX  Take 1 tablet (40 mg total) by mouth daily.   thiamine  100 MG tablet Commonly known as: Vitamin B-1 Take 1 tablet (100 mg total) by mouth daily. Start taking on: September 06, 2023        Disposition   The patient will be discharged in stable condition to home. Discharge Instructions     Diet - low sodium heart healthy   Complete by: As directed    Increase activity slowly   Complete by: As directed    Page VAD Coordinator at [phone]  Notify for: any VAD alarms, sustained elevations of power >10 watts, sustained drop in Pulse Index <3   Complete by: As directed    Notify for:  any VAD alarms sustained elevations of power >10 watts sustained drop in Pulse Index <3         Follow-up Information     Merrell Delanna BROCKS, MD Follow up.   Specialty: Family Medicine Contact information: [address] [place] [address] [phone]                   APP Duration of Discharge Encounter: 15 minutes  Signed, [facility] LINDSAY N  09/05/2023, 11:25 AM  Patient seen with PA, I formulated the plan and agree with the above note.   No abdominal pain today, now eating a regular diet.    LVAD parameters stable still with PI events noted, plts stable at 67K.   General: Well appearing this am. NAD.  HEENT: Normal. Neck: Supple, JVP 7-8 cm. Carotids OK.  Cardiac:  Mechanical heart sounds with LVAD hum present.  Lungs:  CTAB, normal effort.  Abdomen:  NT, ND, no HSM. No bruits or masses. +BS  LVAD exit site: Well-healed and incorporated. Dressing dry and intact. No erythema or drainage. Stabilization device present and  accurately applied. Driveline dressing changed daily per sterile technique. Extremities:  Warm and dry. No cyanosis, clubbing, rash, or edema.  Neuro:  Alert & oriented x 3. Cranial nerves grossly intact. Moves all 4 extremities w/o difficulty. Affect pleasant    I again encouraged po hydration.  I think that he has recovered from his acute pancreatitis.  Cause of pancreatitis uncertain, no gallstones.  He denies heavy ETOH.  UDS with cocaine and THC which are rare causes of acute pancreatitis.  Triglycerides not markedly high.  He needs to avoid ETOH, cocaine, THC in the future => discussed with him again.    We have restarted his chronic cefadroxil .  He has not been anticoagulated at home.  Platelets dropped but have stabilized at 67K. This may be due to inflammation from pancreatitis.    Ok for home today, meds as above, followup LVAD clinic.   Physician time for discharge 32 minutes.   Ezra Shuck 09/05/2023 11:45 AM

## 2023-09-05 ENCOUNTER — Other Ambulatory Visit (HOSPITAL_COMMUNITY): Payer: Self-pay

## 2023-09-05 ENCOUNTER — Encounter (HOSPITAL_COMMUNITY): Payer: Self-pay | Admitting: Cardiology

## 2023-09-05 DIAGNOSIS — Z95811 Presence of heart assist device: Secondary | ICD-10-CM | POA: Diagnosis not present

## 2023-09-05 DIAGNOSIS — K8500 Idiopathic acute pancreatitis without necrosis or infection: Secondary | ICD-10-CM | POA: Diagnosis not present

## 2023-09-05 DIAGNOSIS — I429 Cardiomyopathy, unspecified: Secondary | ICD-10-CM | POA: Diagnosis not present

## 2023-09-05 DIAGNOSIS — I5022 Chronic systolic (congestive) heart failure: Secondary | ICD-10-CM | POA: Diagnosis not present

## 2023-09-05 LAB — CBC
HCT: 30.5 % — ABNORMAL LOW (ref 39.0–52.0)
Hemoglobin: 10.4 g/dL — ABNORMAL LOW (ref 13.0–17.0)
MCH: 34.3 pg — ABNORMAL HIGH (ref 26.0–34.0)
MCHC: 34.1 g/dL (ref 30.0–36.0)
MCV: 100.7 fL — ABNORMAL HIGH (ref 80.0–100.0)
Platelets: 67 K/uL — ABNORMAL LOW (ref 150–400)
RBC: 3.03 MIL/uL — ABNORMAL LOW (ref 4.22–5.81)
RDW: 15.2 % (ref 11.5–15.5)
WBC: 5.2 K/uL (ref 4.0–10.5)
nRBC: 0 % (ref 0.0–0.2)

## 2023-09-05 LAB — COMPREHENSIVE METABOLIC PANEL WITH GFR
ALT: 49 U/L — ABNORMAL HIGH (ref 0–44)
AST: 124 U/L — ABNORMAL HIGH (ref 15–41)
Albumin: 3 g/dL — ABNORMAL LOW (ref 3.5–5.0)
Alkaline Phosphatase: 48 U/L (ref 38–126)
Anion gap: 7 (ref 5–15)
BUN: 10 mg/dL (ref 6–20)
CO2: 28 mmol/L (ref 22–32)
Calcium: 8.9 mg/dL (ref 8.9–10.3)
Chloride: 103 mmol/L (ref 98–111)
Creatinine, Ser: 0.82 mg/dL (ref 0.61–1.24)
GFR, Estimated: >60 mL/min (ref >60)
Glucose, Bld: 106 mg/dL — ABNORMAL HIGH (ref 70–99)
Potassium: 3.8 mmol/L (ref 3.5–5.1)
Sodium: 138 mmol/L (ref 135–145)
Total Bilirubin: 0.8 mg/dL (ref 0.0–1.2)
Total Protein: 5.8 g/dL — ABNORMAL LOW (ref 6.5–8.1)

## 2023-09-05 LAB — PHOSPHORUS: Phosphorus: 2.7 mg/dL (ref 2.5–4.6)

## 2023-09-05 LAB — LACTATE DEHYDROGENASE: LDH: 215 U/L — ABNORMAL HIGH (ref 98–192)

## 2023-09-05 MED ORDER — LACTATED RINGERS IV BOLUS
250.0000 mL | Freq: Once | INTRAVENOUS | Status: AC
  Administered 2023-09-05: 250 mL via INTRAVENOUS

## 2023-09-05 MED ORDER — ADULT MULTIVITAMIN W/MINERALS CH
1.0000 | ORAL_TABLET | Freq: Every day | ORAL | 3 refills | Status: AC
  Filled 2023-09-05: qty 30, 30d supply, fill #0

## 2023-09-05 MED ORDER — THIAMINE HCL 100 MG PO TABS
100.0000 mg | ORAL_TABLET | Freq: Every day | ORAL | 3 refills | Status: DC
  Filled 2023-09-05: qty 30, 30d supply, fill #0

## 2023-09-05 NOTE — Plan of Care (Signed)
  Problem: Education: Goal: Patient will understand all VAD equipment and how it functions Outcome: Progressing Goal: Patient will be able to verbalize current INR target range and antiplatelet therapy for discharge home Outcome: Progressing   Problem: Cardiac: Goal: LVAD will function as expected and patient will experience no clinical alarms Outcome: Progressing   Problem: Education: Goal: Knowledge of General Education information will improve Description: Including pain rating scale, medication(s)/side effects and non-pharmacologic comfort measures Outcome: Progressing   Problem: Health Behavior/Discharge Planning: Goal: Ability to manage health-related needs will improve Outcome: Progressing   Problem: Clinical Measurements: Goal: Ability to maintain clinical measurements within normal limits will improve Outcome: Progressing Goal: Will remain free from infection Outcome: Progressing Goal: Diagnostic test results will improve Outcome: Progressing Goal: Respiratory complications will improve Outcome: Progressing Goal: Cardiovascular complication will be avoided Outcome: Progressing   Problem: Activity: Goal: Risk for activity intolerance will decrease Outcome: Progressing   Problem: Nutrition: Goal: Adequate nutrition will be maintained Outcome: Progressing   Problem: Coping: Goal: Level of anxiety will decrease Outcome: Progressing   Problem: Elimination: Goal: Will not experience complications related to bowel motility Outcome: Progressing Goal: Will not experience complications related to urinary retention Outcome: Progressing   Problem: Pain Managment: Goal: General experience of comfort will improve and/or be controlled Outcome: Progressing   Problem: Safety: Goal: Ability to remain free from injury will improve Outcome: Progressing   Problem: Skin Integrity: Goal: Risk for impaired skin integrity will decrease Outcome: Progressing

## 2023-09-05 NOTE — Progress Notes (Signed)
 Went over AVS with patient, answered any/all questions, IV removed, TOC meds given,  belongings gathered and patient was wheeled down to discharge lounge to wait for his ride.

## 2023-09-05 NOTE — Progress Notes (Signed)
 LVAD Coordinator Rounding Note:  Admitted 09/01/23 to Heart Failure service for syncope from [facility] ED.   HM III LVAD implanted on 02/26/19 by Dr. Obadiah under Destination Therapy criteria.  Patient presented to ER at [facility] in [place] with vomiting - pt states I woke up on the floor.  CT abd/pelvis 09/02/23 Findings consistent with prior LVAD placement. There are no findings to suggest drive line infection or focal abscess. Inflammatory changes of the pancreas are noted consistent with acute pancreatitis. Correlate with laboratory values. No other focal abnormality is noted.  Abd US  09/02/23 Distended gallbladder with some sludge. No shadowing stones or ductal dilatation. Mild fatty liver infiltration.   UDS 09/02/23  + Cocaine & marijuana  Pt in bed asleep on my arrival. Pt diet has been advance to heart healthy. VAD coordinator had discussion with pt about THC and cocaine use. Pt tells me that they are dipping the joint in cocaine before smoking it. I had a lengthy conversation with the pt about this and the risk involved with his illicit behavior along with repercussions.  CT scan showed acute pancreatitis. Currently on IV Vancomycin  & Cefepime . Plan to transition back to PO Cefadroxil .    Pt has hx of drive line infection. Driveline is unremarkable. Pt is on chronic Cefadroxil . ID consulted. Continue chronic Cefadroxil  at discharge.  Hosp follow up made for August 11th at 1pm -  pt aware and instructed to set up his ride through his insurance.    Vital signs: Temp: 98.7 HR: 112 Doppler Pressure: not documented Automatic BP: 99/75 (84) O2 Sat: 99% RA Wt: 108.6>117.7>114.6 lbs  LVAD interrogation reveals:  Speed: 5400 Flow: 3.8 Power: 4w PI: 6.5 Hct: 20 - DO NOT CHANGE   Alarms: none Events: 10 today; 10 yesterday  Fixed speed: 5400 Low speed limit: 5100  Drive Line: Existing VAD dressing clean, dry, and intact. Drive line anchor correctly applied. Continue  weekly dressing changes using the daily kit. Next dressing change 09/09/23 by bedside nurse.   Labs:  LDH trend: 189>204>209>215  Hgb trend: 10.9>9.7>9.9>10.4  Creatinine trend: 3.1>2.32>1.14>0.78>0.82  Anticoagulation Plan: -INR Goal: not on coumadin  -ASA Dose: none  Blood Products:  09/01/23: 1u/PRBC at [facility]   Device: N/A  Infection:  09/01/23 Blood cultures >> performed at [facility] pending 09/01/23 Blood cultures at [facility]>>no growth <4 days  Drips:    Plan/Recommendations:  1. Call VAD Coordinator if any VAD equipment or drive line issues. 2. Driveline dressing changes weekly per bedside nurse.  3. Ok to d/c once deemed appropriate by provider team  Lauraine Ip RN VAD Coordinator  Office: [phone]  24/7 Pager: [phone]

## 2023-09-05 NOTE — Progress Notes (Signed)
 Nutrition Follow Up  DOCUMENTATION CODES:   Non-severe (moderate) malnutrition in context of chronic illness  INTERVENTION:   Continue Boost Breeze po TID between meals, each supplement provides 250 kcal and 9 grams of protein  Continue MVI with Minerals, Folic Acid , Thiamine  by mouth. If pt not tolerating po (N/V), recommend giving the Thiamine  IV for total of 5-7 days. Received IV Thiamine  (and IV folic acid ) x 1 on 7/29. Recommend discharging patient on MVI and Thiamine    NUTRITION DIAGNOSIS:   Moderate Malnutrition related to chronic illness as evidenced by moderate fat depletion, moderate muscle depletion.  GOAL:   Patient will meet greater than or equal to 90% of their needs  MONITOR:   PO intake, Supplement acceptance, Diet advancement, Weight trends, Labs  REASON FOR ASSESSMENT:   Consult Assessment of nutrition requirement/status  ASSESSMENT:   38 yo male admitted with syncope and hypotension, AKI, acute pancreatitis. Pt with LVAD placed under destination therapy 02/26/2019, hx of recurrent drive line infections, mostly recently treated in the Spring 2025. UDS+ for cocaine and THC. Hx of wt loss and malnutrition in addition to EtOH abuse.  7/28 Admitted 7/29 CT A/P with acute pancreatitis 7/31 advanced diet to heart healthy, assessing tolerance for discharge  Per HF, pt tolerated FLD, no reports of emesis since last assessment, pt advanced to heart healthy diet. If pt tolerates, pt will be cleared for discharge. Phosphorus level normalized and will not require any additional repletion.   Spoke with pt who was awake and alert during follow up. Pt has advanced to heart healthy diet and is tolerating well. Pt reports no issues with n/v. Pt ate 100% of breakfast and MD plans to discharge pt today.   Encouraged continued intake of meals and snacks to help meet calorie and protein needs. Encouraged continued multivitamin, folic acid , and thiamine  supplementation after  discharge.   Admit wt: 49.3 kg Current wt: 52 kg  Labs: Sodium 138 (wdl) Potassium 3.8 (wdl) BUN/Creatiine wdl Magnesium  1.9 (wdl) Phosphorus 2.7 (wdl) Glucose 106 (serum), no CBGs  Meds:  MVI with Minerals Folic Acid  1 mg daily Thiamine  100 mg daily  Diet Order:   Diet Order             Diet Heart Room service appropriate? Yes; Fluid consistency: Thin  Diet effective now                   EDUCATION NEEDS:   Not appropriate for education at this time  Skin:  Skin Assessment: Skin Integrity Issues: Skin Integrity Issues:: Incisions Incisions: Drive line for LVAD (chronic)-hx of infections  Last BM:  7/30  Height:   Ht Readings from Last 1 Encounters:  09/02/23 5' 5 (1.651 m)    Weight:   Wt Readings from Last 1 Encounters:  09/05/23 52 kg     BMI:  Body mass index is 19.07 kg/m.  Estimated Nutritional Needs:   Kcal:  2000-2200 kcals  Protein:  100-115 g  Fluid:  1.8 L    Josette Glance, MS, RDN, LDN Clinical Dietitian I Please reach out via secure chat

## 2023-09-05 NOTE — TOC Transition Note (Signed)
 Transition of Care [facility]) - Discharge Note   Patient Details  Name: Jon Martin MRN: [id number] Date of Birth: 22-Jan-1986  Transition of Care [facility]) CM/SW Contact:  Justina Delcia Czar, RN Phone Number: [phone] 09/05/2023, 11:27 AM   Clinical Narrative:    Spoke to pt at bedside. Had discussion on substance abuse, and heart healthy.. Pt states his goal is to stop drug use. Pt verbalized the importance of quitting for overall health. States he smokes marijuana because in his states it is legal. LVAD coordinator did teaching on importance of avoiding drug use.   CM scheduled transportation with his [employer] transport for 3 pm. # [phone], reference # [id number].    Final next level of care: Home/Self Care Barriers to Discharge: No Barriers Identified   Patient Goals and CMS Choice Patient states their goals for this hospitalization and ongoing recovery are:: wants to get better          Discharge Placement                       Discharge Plan and Services Additional resources added to the After Visit Summary for     Discharge Planning Services: CM Consult                                 Social Drivers of Health (SDOH) Interventions SDOH Screenings   Food Insecurity: No Food Insecurity (09/04/2023)  Housing: Low Risk  (09/04/2023)  Transportation Needs: No Transportation Needs (09/04/2023)  Utilities: Not At Risk (09/04/2023)  Alcohol Screen: Medium Risk (03/05/2019)  Depression (PHQ2-9): Low Risk  (02/22/2019)  Financial Resource Strain: High Risk (07/04/2022)  Physical Activity: Sufficiently Active (03/05/2019)  Social Connections: Unknown (07/06/2023)  Tobacco Use: Medium Risk (09/05/2023)     Readmission Risk Interventions    07/09/2023    3:43 PM  Readmission Risk Prevention Plan  Post Dischage Appt Complete  Medication Screening Complete  Transportation Screening Complete

## 2023-09-05 NOTE — Progress Notes (Signed)
 Occupational Therapy Treatment Patient Details Name: Jon Martin MRN: [id number] P DOB: 12/23/1985 Today's Date: 09/05/2023   History of present illness 38 y/o M admitted with nausea/vomiting and pain at his driveline site starting 7/27.  Pt presented to [facility] ED in [place] 09/01/23 with syncope after several episodes of hematemesis and was transferred to [facility]. Positive for driveline infection/sepsis.  Pt also with pancreatitis due to ETOH use.   PMH: severe HFrEF d/t NICM s/p HM3 LVAD implantation and polysubstance abuse (cocaine and ETOH), Underwent HM-3 LVAD placement on 02/26/19 by Dr. 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 Ambulated over 200 ft with VSS. Max HR briefly 131. No further OT needed. OT signing off.       If plan is discharge home, recommend the following:  Assist for transportation;Assistance with cooking/housework   Equipment Recommendations  None recommended by OT    Recommendations for Other Services      Precautions / Restrictions Precautions Precaution/Restrictions Comments: LVAD       Mobility Bed Mobility Overal bed mobility: Independent                  Transfers Overall transfer level: Independent                       Balance Overall balance assessment: Independent                                         ADL either performed or assessed with clinical judgement   ADL Overall ADL's : At baseline                                            Extremity/Trunk Assessment              Vision       Perception     Praxis     Communication     Cognition Arousal: Alert Behavior During Therapy: WFL for tasks assessed/performed Cognition: No apparent impairments                                        Cueing      Exercises      Shoulder Instructions       General Comments      Pertinent  Vitals/ Pain       Pain Assessment Pain Assessment: No/denies pain  Home Living                                          Prior Functioning/Environment              Frequency  Min 1X/week        Progress Toward Goals  OT Goals(current goals can now be found in the care plan section)  Progress towards OT goals: Goals met/education completed, patient discharged from OT  Acute Rehab OT Goals Patient Stated Goal: HOME OT Goal Formulation: With patient ADL Goals Pt Will Perform Grooming: Independently;standing Pt Will Perform Upper Body Dressing: Independently;sitting;standing Pt Will  Perform Lower Body Dressing: Independently;sitting/lateral leans;sit to/from stand Additional ADL Goal #1: pt will tolerate OOB standing activity x10 min in order to improve activity tolerance for ADLs  Plan      Co-evaluation                 AM-PAC OT 6 Clicks Daily Activity     Outcome Measure   Help from another person eating meals?: None Help from another person taking care of personal grooming?: None Help from another person toileting, which includes using toliet, bedpan, or urinal?: None Help from another person bathing (including washing, rinsing, drying)?: None Help from another person to put on and taking off regular upper body clothing?: None Help from another person to put on and taking off regular lower body clothing?: None 6 Click Score: 24    End of Session    OT Visit Diagnosis: Other abnormalities of gait and mobility (R26.89);Unsteadiness on feet (R26.81);Muscle weakness (generalized) (M62.81)   Activity Tolerance Patient tolerated treatment well   Patient Left in bed;with call bell/phone within reach   Nurse Communication Other (comment) (hr)        Time: 1010-1027 OT Time Calculation (min): 17 min  Charges: OT General Charges $OT Visit: 1 Visit OT Treatments $Self Care/Home Management : 8-22 mins  Kreg Sink, OT/L   Acute OT  Clinical Specialist Acute Rehabilitation Services Pager [phone] Office [phone]   [facility] 09/05/2023, 10:29 AM

## 2023-09-06 LAB — CULTURE, BLOOD (ROUTINE X 2)
Culture: NO GROWTH
Culture: NO GROWTH
Special Requests: ADEQUATE
Special Requests: ADEQUATE

## 2023-09-15 ENCOUNTER — Encounter (HOSPITAL_COMMUNITY): Admitting: Cardiology

## 2023-09-15 ENCOUNTER — Encounter (HOSPITAL_COMMUNITY): Payer: Self-pay | Admitting: *Deleted

## 2023-09-15 ENCOUNTER — Telehealth (HOSPITAL_COMMUNITY): Payer: Self-pay | Admitting: *Deleted

## 2023-09-15 NOTE — Telephone Encounter (Signed)
 Attempted to reach pt regarding missed clinic appt today. Pt did not answer, and voicemail is full. Letter sent in MyChart.  Isaiah Knoll RN VAD Coordinator  Office: [phone]  24/7 Pager: [phone]

## 2023-09-25 ENCOUNTER — Ambulatory Visit (HOSPITAL_COMMUNITY)
Admission: RE | Admit: 2023-09-25 | Discharge: 2023-09-25 | Disposition: A | Discharge status: Home / Self Care | Source: Ambulatory Visit | Attending: Cardiology | Admitting: Cardiology

## 2023-09-25 DIAGNOSIS — D696 Thrombocytopenia, unspecified: Secondary | ICD-10-CM | POA: Diagnosis not present

## 2023-09-25 DIAGNOSIS — Z792 Long term (current) use of antibiotics: Secondary | ICD-10-CM | POA: Insufficient documentation

## 2023-09-25 DIAGNOSIS — F1410 Cocaine abuse, uncomplicated: Secondary | ICD-10-CM | POA: Insufficient documentation

## 2023-09-25 DIAGNOSIS — F1010 Alcohol abuse, uncomplicated: Secondary | ICD-10-CM | POA: Insufficient documentation

## 2023-09-25 DIAGNOSIS — Z4509 Encounter for adjustment and management of other cardiac device: Secondary | ICD-10-CM | POA: Diagnosis present

## 2023-09-25 DIAGNOSIS — Z95811 Presence of heart assist device: Secondary | ICD-10-CM | POA: Diagnosis not present

## 2023-09-25 DIAGNOSIS — E46 Unspecified protein-calorie malnutrition: Secondary | ICD-10-CM | POA: Diagnosis not present

## 2023-09-25 DIAGNOSIS — Z7901 Long term (current) use of anticoagulants: Secondary | ICD-10-CM | POA: Diagnosis not present

## 2023-09-25 DIAGNOSIS — T827XXD Infection and inflammatory reaction due to other cardiac and vascular devices, implants and grafts, subsequent encounter: Secondary | ICD-10-CM | POA: Insufficient documentation

## 2023-09-25 DIAGNOSIS — Z79899 Other long term (current) drug therapy: Secondary | ICD-10-CM | POA: Diagnosis not present

## 2023-09-25 DIAGNOSIS — I110 Hypertensive heart disease with heart failure: Secondary | ICD-10-CM | POA: Insufficient documentation

## 2023-09-25 DIAGNOSIS — K920 Hematemesis: Secondary | ICD-10-CM | POA: Diagnosis not present

## 2023-09-25 DIAGNOSIS — I5022 Chronic systolic (congestive) heart failure: Secondary | ICD-10-CM | POA: Diagnosis not present

## 2023-09-25 DIAGNOSIS — I428 Other cardiomyopathies: Secondary | ICD-10-CM | POA: Diagnosis not present

## 2023-09-25 DIAGNOSIS — B9561 Methicillin susceptible Staphylococcus aureus infection as the cause of diseases classified elsewhere: Secondary | ICD-10-CM | POA: Diagnosis not present

## 2023-09-25 DIAGNOSIS — K859 Acute pancreatitis without necrosis or infection, unspecified: Secondary | ICD-10-CM | POA: Insufficient documentation

## 2023-09-25 LAB — CBC
HCT: 34.7 % — ABNORMAL LOW (ref 39.0–52.0)
Hemoglobin: 11.5 g/dL — ABNORMAL LOW (ref 13.0–17.0)
MCH: 34.3 pg — ABNORMAL HIGH (ref 26.0–34.0)
MCHC: 33.1 g/dL (ref 30.0–36.0)
MCV: 103.6 fL — ABNORMAL HIGH (ref 80.0–100.0)
Platelets: 128 K/uL — ABNORMAL LOW (ref 150–400)
RBC: 3.35 MIL/uL — ABNORMAL LOW (ref 4.22–5.81)
RDW: 16.6 % — ABNORMAL HIGH (ref 11.5–15.5)
WBC: 4.7 K/uL (ref 4.0–10.5)
nRBC: 0 % (ref 0.0–0.2)

## 2023-09-25 LAB — COMPREHENSIVE METABOLIC PANEL WITH GFR
ALT: 22 U/L (ref 0–44)
AST: 56 U/L — ABNORMAL HIGH (ref 15–41)
Albumin: 4.2 g/dL (ref 3.5–5.0)
Alkaline Phosphatase: 69 U/L (ref 38–126)
Anion gap: 15 (ref 5–15)
BUN: 5 mg/dL — ABNORMAL LOW (ref 6–20)
CO2: 24 mmol/L (ref 22–32)
Calcium: 9.6 mg/dL (ref 8.9–10.3)
Chloride: 107 mmol/L (ref 98–111)
Creatinine, Ser: 0.93 mg/dL (ref 0.61–1.24)
GFR, Estimated: >60 mL/min (ref >60)
Glucose, Bld: 88 mg/dL (ref 70–99)
Potassium: 3.9 mmol/L (ref 3.5–5.1)
Sodium: 146 mmol/L — ABNORMAL HIGH (ref 135–145)
Total Bilirubin: 0.8 mg/dL (ref 0.0–1.2)
Total Protein: 7.9 g/dL (ref 6.5–8.1)

## 2023-09-25 LAB — MAGNESIUM: Magnesium: 1.9 mg/dL (ref 1.7–2.4)

## 2023-09-25 LAB — PREALBUMIN: Prealbumin: 44 mg/dL — ABNORMAL HIGH (ref 18–38)

## 2023-09-25 LAB — LACTATE DEHYDROGENASE: LDH: 188 U/L (ref 98–192)

## 2023-09-25 NOTE — Patient Instructions (Signed)
 No medication changes Return in 2 months for follow up with Dr. Bensimhon

## 2023-09-25 NOTE — Progress Notes (Signed)
 Patient presents for 2 mo f/u with 4.5 year Intermacs in VAD clinic today alone. Denies issues with VAD equipment or drive line.   Pt states he has been feeling great. Denies lightheadedness, dizziness, falls, shortness of breath, heart failure symptoms, and signs of bleeding. States he has plenty of food, and his family brings food over daily.  Weight maintains the same since discharge.   Pt positive for THC and cocaine last admission. Pt states he continues to use marijuana and drink alcohol but denies additional use of cocaine.  Pt has chronic MSSA drive line infection. Pt confirms that he is taking his Cefadroxil  as prescribed.   Pt recently sent [employer]. He states all equipment is working properly at home.   Vital Signs:  Doppler Pressure: 92 Automatic BP: 103/90 (96)  HR: 86 SPO2: 98%   Weight: 114.8 lb w/o eqt Last clinic weight: 114.6 lb w/o eqt   VAD Indication: Destination Therapy LVAD due to narcotic use   LVAD assessment: HM III: Speed:  5400 rpms                                 Flow: 4.2      Power: 4.1 w                                              PI: 4.9  Hct: 20 -- do not titrate                                   Alarms: none Events: 0-10  Fixed speed: 5400 Low speed limit: 5100  Primary Controller: Replace back up battery in 24 months Back up controller: Pt's back up bag infested with roaches. Unable to assess back up battery at today's visit. Pt advise to clean all equipment in back up bag. Will attempt to assess next visit.    I reviewed the LVAD parameters from today and compared the results to the patient's prior recorded data. LVAD interrogation was NEGATIVE for significant power changes, NEGATIVE for clinical alarms, and POSITIVE for PI events/speed drops. No programming changes were made and pump is functioning within specified parameters. Pt is performing daily controller and system monitor self tests along with completing  weekly and monthly maintenance for LVAD equipment.   LVAD equipment check completed and is in good working order. Back-up equipment present.   Annual Equipment Maintenance on UBC/PM was performed on 05/08/23.  Patient completed 1450 feet during 6 minute walk test. Tolerated well.   Neurocognitive trail making completed correctly in 2 m 16 s.   [place] Cardiomyopathy, EQ-5D-3L and post-VAD QOL completed by the patient independently.   [place] Cardiomyopathy Questionnaire     09/25/2023    2:48 PM 01/27/2023    1:08 PM 09/25/2022    3:32 PM  KCCQ-12  1 a. Ability to shower/bathe Not at all limited Not at all limited Not at all limited  1 b. Ability to walk 1 block Not at all limited Not at all limited Not at all limited  1 c. Ability to hurry/jog Not at all limited Not at all limited Not at all limited  2. Edema feet/ankles/legs Never over the past 2 weeks Never over  the past 2 weeks Never over the past 2 weeks  3. Limited by fatigue Never over the past 2 weeks Never over the past 2 weeks Never over the past 2 weeks  4. Limited by dyspnea Never over the past 2 weeks Never over the past 2 weeks Never over the past 2 weeks  5. Sitting up / on 3+ pillows Never over the past 2 weeks Never over the past 2 weeks Never over the past 2 weeks  6. Limited enjoyment of life Not limited at all Not limited at all Not limited at all  7. Rest of life w/ symptoms Not at all satisfied Not at all satisfied Not at all satisfied  8 a. Participation in hobbies N/A, did not do for other reasons Did not limit at all Did not limit at all  8 b. Participation in chores N/A, did not do for other reasons Did not limit at all Did not limit at all  8 c. Visiting family/friends N/A, did not do for other reasons Did not limit at all Did not limit at all     Exit Site Care: Existing weekly VAD dressing C/D/I with anchor intact and accurately applied. Patient is changing weekly.  Pt denies any redness, tenderness  or drainage at exit site. Pt given 8 weekly kits and 8 anchors for home use  Device: N/A  BP & Labs:  Doppler BP 92 - Doppler is reflecting MAP   Hgb 11.5 -  No S/S of bleeding. Specifically denies melena/BRBPR or nosebleeds.   LDH 188 - with established baseline of 250 - 430. Denies tea-colored urine. No power elevations noted on interrogation.      Patient Instructions: No medication changes Return in 2 months for follow up with Dr. Cherrie Schuyler Lunger RN, BSN VAD Coordinator 24/7 Pager [phone]

## 2023-09-27 NOTE — Progress Notes (Signed)
 VAD CLINIC NOTE   Chief complaint: HF  HPI:  [name] is a 38 y/o male from [place]  w/ h/o severe systolic HF due to NICM EF 15% and prior polysubstance abuse (cocaine and ETOH)   Underwent HM-3 LVAD placement on 02/26/19 by Dr. Fleeta Brooke. Required milrinone  post op for RV support.  Admitted 6/21 for AKI and hypotension in setting of volume depletion due to GI bug.Had multiple flow alarms. Echo EF 40-45%   Admitted 12/21 for anemia in setting of supratherapeutic INR (8.0) and bleeding from a sore on his lip. Transfused but did not bleed any further. Developed refractory low flow alarms on VAD with drop in platelets and rising LDH. RUQ U/S was normal (doubt splenic sequestration). Peripheral smear showed no evidence of hemolysis.Patient placed on his left side and low flow alarms resolved. It was suspected possible obstruction/clot of outflow graft causing Plts and Hgb to drop. CT 12/10 showed no evidence of outflow graft kink/clot.  LDH trended back down to his baseline, below 200s. Plts started to trend back up.  Admitted 3/22 for MSSA DL infection requiring surgical debridement. D/c'd to [facility] for IV abx.    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 Wound cx grew MSSA. Treated with cefazolin . Discharged home on 03/16/21 with wound vac. ID recommended oral linezolid  BID for 3 weeks at discharge then switch to chronic cefadroxil  afterwards.  Has been admitted twice since his last clinic visit. Was admitted 07/2023 for reports of coffee ground emesis after multiple vomiting episodes. Severely elevated lactic acid on arrival, was hypovolemic, improved with IVF, no source of bleeding on EGD.  Admitted 08/2023 with syncope after reported episodes of heamtemesis. Hypotensive and hypovolemic again on arrival, found to have acute pancreatitis.   Overall doing well since his last discharge. Denies any abdominal pain, bleeding, vomiting, shortness of breath,  chest pain. Not sure of which medications he is taking, but is taking what is prescribed. No LVAD issues.    VAD Indication: Destination Therapy LVAD due to narcotic use   LVAD assessment: HM III: Speed:  5400 rpms                                 Flow: 4.2                                 Power: 4.1  w                                              PI: 4.9   Hct: 20 -- do not titrate                                   Alarms: none Events: 0-10  Fixed speed: 5400 Low speed limit: 5100   Primary Controller: Replace back up battery in 28 months Back up controller:   Replace back up battery in 7 months   I reviewed the LVAD parameters from today and compared the results to the patient's prior recorded data. LVAD interrogation was NEGATIVE for significant power changes, NEGATIVE for clinical alarms, and POSITIVE for PI events/speed  drops. No programming changes were made and pump is functioning within specified parameters. Pt is performing daily controller and system monitor self tests along with completing weekly and monthly maintenance for LVAD equipment.   LVAD equipment check completed and is in good working order. Back-up equipment present.    Annual Equipment Maintenance on UBC/PM was performed on 05/08/23.   Past Medical History:  Diagnosis Date   Dilated cardiomyopathy (HCC)    Dilated cardiomyopathy (HCC) 02/2019   Polysubstance abuse (HCC)     Current Outpatient Medications  Medication Sig Dispense Refill   cefadroxil  (DURICEF) 500 MG capsule Take 1 capsule (500 mg total) by mouth 2 (two) times daily. 60 capsule 11   KLOR-CON  M20 20 MEQ tablet TAKE 2 TABLETS (40 MEQ) BY MOUTH DAILY, OR AS DIRECTED BY HEART FAILURE CLINIC 180 tablet 3   Multiple Vitamin (MULTIVITAMIN WITH MINERALS) TABS tablet Take 1 tablet by mouth daily. 30 tablet 3   pantoprazole  (PROTONIX ) 40 MG tablet Take 1 tablet (40 mg total) by mouth daily. 30 tablet 0   thiamine  (VITAMIN B1) 100 MG tablet Take 1  tablet (100 mg total) by mouth daily. 30 tablet 3   No current facility-administered medications for this encounter.    Patient has no known allergies.    Vital Signs:    Doppler Pressure: 92 Automatic BP: 103/90 (96)  HR: 86 SPO2: 98%   Weight: 114.8 lb w/o eqt Last clinic weight: 114.6 lb w/o eqt  Physical Exam: GENERAL: NAD, thin PULM:  Normal work of breathing, CTAB CARDIAC:  JVP: flat         LVAD hum, S1/S2, no palpable pulse ABDOMEN: Soft, non-tender, non-distended. Driveline dressing clean NEUROLOGIC: Patient is oriented x3 with no focal or lateralizing neurologic deficits.      ASSESSMENT AND PLAN:   1. Chronic systolic HF due to severe NICM s/p VAD - Echo EF 10% with biventricular failure - HM-3 VAD implant 02/26/19 - Echo 7/21 EF 40-45% - Echo 09/02/23: EF 40-45% - Normal LV size on low speed and minimal GDMT. Would be a reasonable recovery candidate, however there would be difficulty uptitrating his medications and does not have an out if he were to fail decommissioning. However, if he continues to have bleeding issues or worsening AR would need to consider - Stable NYHA I - Volume ok - MAPs mildly elevated  2. Recurrent MSSA DL infection - Initial episode 3/22 - Recurrent infection 2/23 requiring debridement and wound vac.  - Site stable - Continue suppressive cefadroxil   3. VAD - Admit 12/21 with multiple low flow alarms. Rising LDH and decreasing PLTs. Suspected outflow graft kink occlusion. Patient placed on his left side and resolved.  - VAD interrogated, stable parameters - DL site management as above - UBC did not pass annual maintenance testing. Given loaner.  - Hgb stable 11.5 - LDH 188 - Remains off warfarin due to labile INRs, recent bleeding  4. HTN - MAPs mildly elevated, but has been hypotensive multiple times recently. PI normal  5. Elevated LFTs/thrombocytopenia - suspect related to cefuroxil - RUQ u/s 8/23 ok. Hepatitis panels  negative - normalized  6. Weight loss -> protein-calorie malnutrition - has struggled with poor diet since his mother passed - Weight down again - Encouraged him to drink more Ensure  7. Hematemesis/pancreatitis:  - Recent admissions for primarily GI complaints. Suspect some may be related to ongoing substance use - No symptoms currently  I spent a total of 52 minutes today:  1) reviewing the patient's medical records including previous charts, labs and recent notes from other providers; 2) examining the patient and counseling them on their medical issues/explaining the plan of care; 3) adjusting meds as needed and 4) ordering lab work or other needed tests 5) revieiwing 2 recent admissions  Morene JINNY Brownie, MD  2:25 PM

## 2023-10-01 ENCOUNTER — Other Ambulatory Visit (HOSPITAL_COMMUNITY): Payer: Self-pay | Admitting: Infectious Diseases

## 2023-10-01 ENCOUNTER — Other Ambulatory Visit (HOSPITAL_COMMUNITY): Payer: Self-pay | Admitting: Internal Medicine

## 2023-10-01 DIAGNOSIS — Z95811 Presence of heart assist device: Secondary | ICD-10-CM

## 2023-10-01 DIAGNOSIS — T827XXA Infection and inflammatory reaction due to other cardiac and vascular devices, implants and grafts, initial encounter: Secondary | ICD-10-CM

## 2023-10-01 DIAGNOSIS — Z7901 Long term (current) use of anticoagulants: Secondary | ICD-10-CM

## 2023-10-01 DIAGNOSIS — I5022 Chronic systolic (congestive) heart failure: Secondary | ICD-10-CM

## 2023-10-02 NOTE — Telephone Encounter (Signed)
 Patient is on cefadroxil , not levofloxacin .   Jon Martin, BSN, RN

## 2023-10-21 ENCOUNTER — Encounter: Payer: Self-pay | Admitting: Internal Medicine

## 2023-10-21 ENCOUNTER — Ambulatory Visit: Admitting: Internal Medicine

## 2023-10-21 ENCOUNTER — Other Ambulatory Visit: Payer: Self-pay

## 2023-10-21 VITALS — Temp 97.7°F

## 2023-10-21 DIAGNOSIS — B9561 Methicillin susceptible Staphylococcus aureus infection as the cause of diseases classified elsewhere: Secondary | ICD-10-CM | POA: Diagnosis not present

## 2023-10-21 DIAGNOSIS — T827XXA Infection and inflammatory reaction due to other cardiac and vascular devices, implants and grafts, initial encounter: Secondary | ICD-10-CM

## 2023-10-21 NOTE — Progress Notes (Signed)
 [facility]  Patient Active Problem List   Diagnosis Date Noted   Pancreatitis 09/02/2023   Syncope 09/01/2023   Anemia 07/07/2023   Coffee ground emesis 07/06/2023   GI bleed 07/04/2023   Malnutrition of moderate degree 05/23/2020   MSSA (methicillin susceptible Staphylococcus aureus) infection    Loss of teeth due to extraction    Retained dental root    Chronic periodontitis    Complication involving left ventricular assist device (LVAD) 04/28/2020   Dental caries 04/28/2020   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/2020   Left ventricular assist device (LVAD) complication 01/12/2020   Dehydration, moderate 08/02/2019   Chronic systolic heart failure (HCC) 08/02/2019   AKI (acute kidney injury) (HCC) 08/02/2019   Nausea and vomiting in adult 08/02/2019   LVAD (left ventricular assist device) present (HCC) 08/02/2019   Hypokalemia 08/02/2019   Congestive heart failure (HCC)    Cardiogenic shock (HCC)    Goals of care, counseling/discussion    Protein-calorie malnutrition, severe 02/11/2019   Dilated cardiomyopathy (HCC) 02/10/2019      Subjective:    Patient ID: Jon Martin, male    DOB: 07-Sep-1985, 38 y.o.   MRN: [id number]  Chief Complaint  Patient presents with   Follow-up    HPI:  Jon Martin is a 38 y.o. male here for hospital f/u of lvad infection  09/02/23 admission to [facility] for pancreatitis and dehydration  No sign of lvad infection  He has chronic mssa lvad driveline infection. Initial I&D for infection 05/2020; recurrent 02/2021; cx mssa.  Discharged on chronic cefadroxil   10/21/23 id clinic f/u No complaint today Takes cefadroxil  no missed dose last 4 weeks No n/v/d No belly pain He is at baseline health No health concern today    No Known Allergies    Outpatient Medications Prior to Visit  Medication Sig Dispense Refill   cefadroxil   (DURICEF) 500 MG capsule TAKE 1 CAPSULE BY MOUTH TWICE A DAY 60 capsule 11   KLOR-CON  M20 20 MEQ tablet TAKE 2 TABLETS (40 MEQ) BY MOUTH DAILY, OR AS DIRECTED BY HEART FAILURE CLINIC 60 tablet 11   Multiple Vitamin (MULTIVITAMIN WITH MINERALS) TABS tablet Take 1 tablet by mouth daily. 30 tablet 3   pantoprazole  (PROTONIX ) 40 MG tablet Take 1 tablet (40 mg total) by mouth daily. 30 tablet 0   thiamine  (VITAMIN B1) 100 MG tablet Take 1 tablet (100 mg total) by mouth daily. 30 tablet 3   No facility-administered medications prior to visit.     Social History   Socioeconomic History   Marital status: Single    Spouse name: Not on file   Number of children: Not on file   Years of education: Not on file   Highest education level: Not on file  Occupational History   Not on file  Tobacco Use   Smoking status: Former    Types: Cigarettes   Smokeless tobacco: Never  Vaping Use   Vaping status: Some Days  Substance and Sexual Activity   Alcohol use: Yes   Drug use: Not Currently    Types: Marijuana    Comment: Hx of cocaine use, smoke legal marijuana occasionally - last time [id number]   Sexual activity: Not on file  Other Topics Concern   Not on file  Social History Narrative   Not on file   Social Drivers of Health   Financial Resource Strain: High Risk (  07/04/2022)   Overall Financial Resource Strain (CARDIA)    Difficulty of Paying Living Expenses: Hard  Food Insecurity: No Food Insecurity (09/04/2023)   Hunger Vital Sign    Worried About Running Out of Food in the Last Year: Never true    Ran Out of Food in the Last Year: Never true  Transportation Needs: No Transportation Needs (09/04/2023)   PRAPARE - Administrator, Civil Service (Medical): No    Lack of Transportation (Non-Medical): No  Physical Activity: Sufficiently Active (03/05/2019)   Exercise Vital Sign    Days of Exercise per Week: 7 days    Minutes of Exercise per Session: 60 min  Stress: Not on file   Social Connections: Unknown (07/06/2023)   Social Connection and Isolation Panel    Frequency of Communication with Friends and Family: Twice a week    Frequency of Social Gatherings with Friends and Family: More than three times a week    Attends Religious Services: Patient declined    Database administrator or Organizations: Patient declined    Attends Banker Meetings: Patient declined    Marital Status: Patient declined  Intimate Partner Violence: Not At Risk (09/04/2023)   Humiliation, Afraid, Rape, and Kick questionnaire    Fear of Current or Ex-Partner: No    Emotionally Abused: No    Physically Abused: No    Sexually Abused: No      Review of Systems    All other ros negative  Objective:    Temp 97.7 F (36.5 C) (Temporal)  Nursing note and vital signs reviewed.  Physical Exam     General/constitutional: no distress, pleasant HEENT: Normocephalic, PER, Conj Clear, EOMI, Oropharynx clear Neck supple CV: rrr no mrg Lungs: clear to auscultation, normal respiratory effort Abd: Soft, Nontender Ext: no edema Skin: see picture; the orange spot is hot sauce spilled today 9/16 pic  Neuro: nonfocal MSK: no peripheral joint swelling/tenderness/warmth; back spines nontender     Labs: Lab Results  Component Value Date   WBC 4.7 09/25/2023   HGB 11.5 (L) 09/25/2023   HCT 34.7 (L) 09/25/2023   MCV 103.6 (H) 09/25/2023   PLT 128 (L) 09/25/2023   Last metabolic panel Lab Results  Component Value Date   GLUCOSE 88 09/25/2023   NA 146 (H) 09/25/2023   K 3.9 09/25/2023   CL 107 09/25/2023   CO2 24 09/25/2023   BUN 5 (L) 09/25/2023   CREATININE 0.93 09/25/2023   GFRNONAA >60 09/25/2023   CALCIUM 9.6 09/25/2023   PHOS 2.7 09/05/2023   PROT 7.9 09/25/2023   ALBUMIN  4.2 09/25/2023   BILITOT 0.8 09/25/2023   ALKPHOS 69 09/25/2023   AST 56 (H) 09/25/2023   ALT 22 09/25/2023   ANIONGAP 15 09/25/2023     Micro:  Serology:  Imaging:  Assessment & Plan:   Problem List Items Addressed This Visit     Infection associated with driveline of left ventricular assist device (LVAD) (HCC) - Primary      No orders of the defined types were placed in this encounter.    38 yo male with lvad here for f/u  09/02/23 admission to [facility] for pancreatitis and dehydration  No sign of lvad infection  He has chronic mssa lvad driveline infection. Initial I&D for infection 05/2020; recurrent 02/2021; cx mssa.  Discharged on chronic cefadroxil    10/21/23 -- no issue today post discharge; patient seem to keep his lvad dressing well.  Continue current  cefadroxil ; no refill needed  Indefinite abx duration [facility] clinic in a few weeks Follow up with me in 6 months     Follow-up: Return in about 6 months (around 04/19/2024).      Constance ONEIDA Passer, MD [facility] 10/21/2023, 3:46 PM

## 2023-10-21 NOTE — Patient Instructions (Signed)
 You are doing well. No sign of active uncontrolled infection   Continue cefadroxil  1000 mg twice a day    See me in 6 months and will get some blood tests then   Follow up with heart failure clinic as directed by them

## 2023-11-14 ENCOUNTER — Other Ambulatory Visit (HOSPITAL_COMMUNITY): Payer: Self-pay | Admitting: Unknown Physician Specialty

## 2023-11-14 DIAGNOSIS — Z95811 Presence of heart assist device: Secondary | ICD-10-CM

## 2023-11-14 DIAGNOSIS — Z7901 Long term (current) use of anticoagulants: Secondary | ICD-10-CM

## 2023-11-17 ENCOUNTER — Encounter (HOSPITAL_COMMUNITY): Payer: Self-pay | Admitting: *Deleted

## 2023-11-17 ENCOUNTER — Encounter (HOSPITAL_COMMUNITY): Admitting: Internal Medicine

## 2023-11-25 ENCOUNTER — Other Ambulatory Visit (HOSPITAL_COMMUNITY): Payer: Self-pay

## 2023-11-26 ENCOUNTER — Ambulatory Visit (HOSPITAL_COMMUNITY)
Admission: RE | Admit: 2023-11-26 | Discharge: 2023-11-26 | Disposition: A | Discharge status: Home / Self Care | Source: Ambulatory Visit | Attending: Cardiology | Admitting: Cardiology

## 2023-11-26 DIAGNOSIS — B9561 Methicillin susceptible Staphylococcus aureus infection as the cause of diseases classified elsewhere: Secondary | ICD-10-CM | POA: Insufficient documentation

## 2023-11-26 DIAGNOSIS — E46 Unspecified protein-calorie malnutrition: Secondary | ICD-10-CM | POA: Diagnosis not present

## 2023-11-26 DIAGNOSIS — Z7901 Long term (current) use of anticoagulants: Secondary | ICD-10-CM | POA: Diagnosis not present

## 2023-11-26 DIAGNOSIS — I5022 Chronic systolic (congestive) heart failure: Secondary | ICD-10-CM | POA: Insufficient documentation

## 2023-11-26 DIAGNOSIS — F1410 Cocaine abuse, uncomplicated: Secondary | ICD-10-CM | POA: Insufficient documentation

## 2023-11-26 DIAGNOSIS — K920 Hematemesis: Secondary | ICD-10-CM | POA: Diagnosis not present

## 2023-11-26 DIAGNOSIS — I428 Other cardiomyopathies: Secondary | ICD-10-CM | POA: Diagnosis not present

## 2023-11-26 DIAGNOSIS — I110 Hypertensive heart disease with heart failure: Secondary | ICD-10-CM | POA: Insufficient documentation

## 2023-11-26 DIAGNOSIS — K859 Acute pancreatitis without necrosis or infection, unspecified: Secondary | ICD-10-CM | POA: Insufficient documentation

## 2023-11-26 DIAGNOSIS — D696 Thrombocytopenia, unspecified: Secondary | ICD-10-CM | POA: Diagnosis not present

## 2023-11-26 DIAGNOSIS — Z95811 Presence of heart assist device: Secondary | ICD-10-CM | POA: Insufficient documentation

## 2023-11-26 DIAGNOSIS — R7989 Other specified abnormal findings of blood chemistry: Secondary | ICD-10-CM | POA: Insufficient documentation

## 2023-11-26 DIAGNOSIS — I5082 Biventricular heart failure: Secondary | ICD-10-CM | POA: Insufficient documentation

## 2023-11-26 DIAGNOSIS — Z79899 Other long term (current) drug therapy: Secondary | ICD-10-CM | POA: Diagnosis not present

## 2023-11-26 LAB — BASIC METABOLIC PANEL WITH GFR
Anion gap: 8 (ref 5–15)
BUN: 14 mg/dL (ref 6–20)
CO2: 25 mmol/L (ref 22–32)
Calcium: 8.3 mg/dL — ABNORMAL LOW (ref 8.9–10.3)
Chloride: 103 mmol/L (ref 98–111)
Creatinine, Ser: 1.21 mg/dL (ref 0.61–1.24)
GFR, Estimated: >60 mL/min (ref >60)
Glucose, Bld: 93 mg/dL (ref 70–99)
Potassium: 3.3 mmol/L — ABNORMAL LOW (ref 3.5–5.1)
Sodium: 136 mmol/L (ref 135–145)

## 2023-11-26 LAB — CBC
HCT: 28.2 % — ABNORMAL LOW (ref 39.0–52.0)
Hemoglobin: 9.7 g/dL — ABNORMAL LOW (ref 13.0–17.0)
MCH: 34.8 pg — ABNORMAL HIGH (ref 26.0–34.0)
MCHC: 34.4 g/dL (ref 30.0–36.0)
MCV: 101.1 fL — ABNORMAL HIGH (ref 80.0–100.0)
Platelets: 322 K/uL (ref 150–400)
RBC: 2.79 MIL/uL — ABNORMAL LOW (ref 4.22–5.81)
RDW: 13.7 % (ref 11.5–15.5)
WBC: 6.5 K/uL (ref 4.0–10.5)
nRBC: 0 % (ref 0.0–0.2)

## 2023-11-26 LAB — LACTATE DEHYDROGENASE: LDH: 207 U/L — ABNORMAL HIGH (ref 98–192)

## 2023-11-26 NOTE — Progress Notes (Signed)
 VAD CLINIC NOTE   Chief complaint: HF  HPI:  [name] is a 38 y/o male from [place]  w/ h/o severe systolic HF due to NICM EF 15% and prior polysubstance abuse (cocaine and ETOH)   Underwent HM-3 LVAD placement on 02/26/19 by Dr. Fleeta Brooke. Required milrinone  post op for RV support.  Admitted 6/21 for AKI and hypotension in setting of volume depletion due to GI bug.Had multiple flow alarms. Echo EF 40-45%   Admitted 12/21 for anemia in setting of supratherapeutic INR (8.0) and bleeding from a sore on his lip. Transfused but did not bleed any further. Developed refractory low flow alarms on VAD with drop in platelets and rising LDH. RUQ U/S was normal (doubt splenic sequestration). Peripheral smear showed no evidence of hemolysis.Patient placed on his left side and low flow alarms resolved. It was suspected possible obstruction/clot of outflow graft causing Plts and Hgb to drop. CT 12/10 showed no evidence of outflow graft kink/clot.  LDH trended back down to his baseline, below 200s. Plts started to trend back up.  Admitted 3/22 for MSSA DL infection requiring surgical debridement. D/c'd to [facility] for IV abx.    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 Wound cx grew MSSA. Treated with cefazolin . Discharged home on 03/16/21 with wound vac. ID recommended oral linezolid  BID for 3 weeks at discharge then switch to chronic cefadroxil  afterwards.  Has been admitted twice since his last clinic visit. Was admitted 07/2023 for reports of coffee ground emesis after multiple vomiting episodes. Severely elevated lactic acid on arrival, was hypovolemic, improved with IVF, no source of bleeding on EGD.  Admitted 08/2023 with syncope after reported episodes of heamtemesis. Hypotensive and hypovolemic again on arrival, found to have acute pancreatitis.   No significant changes since his last visit. He evidently changed his controller out at some point after his last  visit, but the circumstances are unclear. Did not bring the old primary controller with him. New backup controller provided today. Reports a few low flows during  vomiting after his brother died a few weeks ago, but no blood in his vomit or stool. Otherwise no significant complaints.   LVAD Documentation  VAD Indication: Destination Therapy LVAD due to narcotic use   LVAD assessment: HM III: Speed:  5400 rpms                                 Flow: 3.6                                 Power: 4  w                                              PI: 6.4   Hct: 20 -- do not titrate                                   Alarms: No external power 10/21; pt reports power outage  Events: Rare  Fixed speed: 5400 Low speed limit: 5100   Primary Controller: Back up battery replaced today in clinic with SN: [id number] Replace back up  battery in 32 months Back up controller:   Replace back up battery in 24 months   I reviewed the LVAD parameters from today and compared the results to the patient's prior recorded data. LVAD interrogation was NEGATIVE for significant power changes, NEGATIVE for clinical alarms, and POSITIVE for PI events/speed drops. No programming changes were made and pump is functioning within specified parameters. Pt is performing daily controller and system monitor self tests along with completing weekly and monthly maintenance for LVAD equipment.   LVAD equipment check completed and is in good working order. Back-up equipment present.    Annual Equipment Maintenance on UBC/PM was performed on 05/08/23.   Past Medical History:  Diagnosis Date   Dilated cardiomyopathy (HCC)    Dilated cardiomyopathy (HCC) 02/2019   Polysubstance abuse (HCC)     Current Outpatient Medications  Medication Sig Dispense Refill   cefadroxil  (DURICEF) 500 MG capsule TAKE 1 CAPSULE BY MOUTH TWICE A DAY 60 capsule 11   KLOR-CON  M20 20 MEQ tablet TAKE 2 TABLETS (40 MEQ) BY MOUTH DAILY, OR AS DIRECTED BY  HEART FAILURE CLINIC 60 tablet 11   Multiple Vitamin (MULTIVITAMIN WITH MINERALS) TABS tablet Take 1 tablet by mouth daily. 30 tablet 3   pantoprazole  (PROTONIX ) 40 MG tablet Take 1 tablet (40 mg total) by mouth daily. 30 tablet 0   thiamine  (VITAMIN B1) 100 MG tablet Take 1 tablet (100 mg total) by mouth daily. 30 tablet 3   No current facility-administered medications for this encounter.    Patient has no known allergies.    LVAD Documentation    11/26/2023  Device Info  LVAD Type: Heartmate III  Date of Implant: 02/26/2019  Therapy Type: Destination Therapy      11/26/2023  Vitals  Heart Rate: 92 BPM  Automatic BP: 95/72  Doppler MAP: 102 mmHg  SpO2: 99 %    Last 3 Weights Weight Weight  11/26/2023 52.254 kg 115 lb 3.2 oz  09/25/2023 52.073 kg 114 lb 12.8 oz  09/05/2023 51.982 kg 114 lb 9.6 oz       11/26/2023  LVAD Paramaters  Speed: 5400 RPM  Flow: 4 LPM  PI: 6  Power: 4 Watts  Hematocrit: 20 %  Alarms: No external power 10/21; pt reports loss of power  Events: rare  Last Right Heart Cath Date: 02/25/2019  Bleeding History: Yes  Type of Bleeding Hx: Other  Type of Bleeding Hx: hematemesis  Type of Dressing: Weekly  Annual Maintenance Date: 05/08/2023    Labs    Units 11/26/23 1111 09/25/23 1337 09/05/23 0300 09/03/23 0254 09/02/23 0435 07/07/23 0207 07/06/23 0228 07/05/23 0415  INR   --   --   --   --  0.9  --  0.9 1.0  LDH U/L 207* 188 215*   < > 189   < > 163 157  HGB g/dL 9.7* 88.4* 89.5*   < > 10.9*   < > 11.0* 10.7*  CREATININE mg/dL 8.78 9.06 9.17   < > 7.67*   < > 1.02 1.67*   < > = values in this interval not displayed.        Physical Exam: General:  Well appearing. No resp difficulty. thin Cor: Mechanical heart sounds with LVAD hum present. JVP flat, No edema Lungs: Normal WOB Abdomen: soft, nontender, nondistended.  Driveline: C/D/I; securement device intact and driveline incorporated Neuro: alert & orientedx3, cranial nerves grossly  intact. moves all 4 extremities w/o difficulty.  ASSESSMENT AND PLAN:   1. Chronic systolic HF due to severe NICM s/p VAD - Echo EF 10% with biventricular failure - HM-3 VAD implant 02/26/19 - Echo 7/21 EF 40-45% - Echo 09/02/23: EF 40-45% - Normal LV size on low speed and minimal GDMT. Would be a reasonable recovery candidate from a purely medical sense, however there would be difficulty uptitrating his medications and does not have an out if he were to fail decommissioning. However, if he continues to have bleeding issues or worsening AR can consider - Stable NYHA I - Volume ok - MAPs 80  2. Recurrent MSSA DL infection - Initial episode 3/22 - Recurrent infection 2/23 requiring debridement and wound vac.  - Site stable - Continue suppressive cefadroxil   3. VAD - Admit 12/21 with multiple low flow alarms. Rising LDH and decreasing PLTs. Suspected outflow graft kink occlusion. Patient placed on his left side and resolved.  - VAD interrogated, stable parameters - DL site management as above - Hgb 9.7, near baseline - LDH 207 - Remains off warfarin due to labile INRs, recent bleeding  4. HTN - MAP stable today, suspect doppler pressure represents systolic given his native LV function  5. Elevated LFTs/thrombocytopenia - suspect related to cefuroxil - RUQ u/s 8/23 ok. Hepatitis panels negative - normalized  6. Weight loss -> protein-calorie malnutrition - has struggled with poor diet since his mother passed, now unfortunately compounded after his brother has passed - Family helps bring him food  7. Hematemesis/pancreatitis:  - Recent admissions for primarily GI complaints. Suspect some may be related to ongoing substance use - No symptoms currently  I spent 41 minutes caring for this patient today including face to face time, ordering and reviewing labs, reviewing records from prior VAD admission, seeing the patient, documenting in the record, and arranging follow  ups.   Morene JINNY Brownie, MD  11:29 AM

## 2023-11-26 NOTE — Progress Notes (Signed)
 Patient presents for hospital f/u with annual main in VAD clinic today alone. Denies issues with VAD equipment or drive line.   Pt admitted in June and July with hematemesis. UDS + THC/Cocaine. EGD performed in 07/09/23 with no acute source of bleeding. Previous Mallory Weis tear. Pt has no showed multiple follow ups since discharge.  Pt states he has been feeling great. Denies lightheadedness, dizziness, falls, shortness of breath, heart failure symptoms, and signs of bleeding.   States he did have some LOW FLOWS due to vomiting after the passing of his brother 2 weeks ago. Pt said  this thing wouldn't stop alarming so I changed it. Confirmed that pt independently changes his controller without the advice of a VAD Coordinator. Pt does not have old primary controller with him today. Unable to gather full story of why pt changed controller. New back up controller given to pt today SN: [id number]. Pt asked to discard old controller. Pt verbalized understanding. Extensive conversation held about paging the VAD Coordinator prior to making any changes to VAD equipment. Pt has a new phone VAD Clinic and Emergency Pager numbers programmed in phone.   Pt's weight stable today. States he did not have a appetite after the passing of his brother 2 weeks ago. States he has plenty of food, and his family brings food over daily. He is drinking an Ensure twice a day. Encouraged increased hydration. Denies drug use.   Pt confirms that he is taking all medications as prescribed.    Vital Signs:  Doppler Pressure: 104 Automatic BP: 95/72 (80) HR: 92 SPO2: 99%   Weight: 115.2 lb w/o eqt Last clinic weight: 114.8 lb w/o eqt   VAD Indication: Destination Therapy LVAD due to narcotic use   LVAD assessment: HM III: Speed:  5400 rpms                                 Flow: 3.6      Power: 4.0 w                                              PI: 6.4  Hct: 20 -- do not titrate                                    Alarms: No external power 10/21; pt reports power outage Events: rare  Fixed speed: 5400 Low speed limit: 5100  Primary Controller: Back up battery replaced today in clinic with SN: [id number] Replace back up battery in 32 months Back up controller:   Replace back up battery in 24 months   I reviewed the LVAD parameters from today and compared the results to the patient's prior recorded data. LVAD interrogation was NEGATIVE for significant power changes, NEGATIVE for clinical alarms, and POSITIVE for PI events/speed drops. No programming changes were made and pump is functioning within specified parameters. Pt is performing daily controller and system monitor self tests along with completing weekly and monthly maintenance for LVAD equipment.   LVAD equipment check completed and is in good working order. Back-up equipment present.   Annual Equipment Maintenance on UBC/PM was performed on 05/08/23.  Exit Site Care: Existing VAD dressing CDI. Anchor replaced. Pt denies redness, drainage or  irritation at exit site. Given 8 weekly kits and 4 anchors for home use.   Device: N/A  BP & Labs:  Doppler BP 104 - Doppler is reflecting modified systolic   Hgb 9.7 -  No S/S of bleeding. Specifically denies melena/BRBPR or nosebleeds.   LDH 207 - with established baseline of 250 - 430. Denies tea-colored urine. No power elevations noted on interrogation.     Annual maintenance completed per [employer] on 05/28/23. Next annual maintenance due 02/26/2024.   Patient Instructions: No medication changes Return to VAD Clinic in 2 months for follow up with Dr. Cherrie Schuyler Lunger RN, BSN VAD Coordinator 24/7 Pager [phone]

## 2024-01-21 ENCOUNTER — Other Ambulatory Visit (HOSPITAL_COMMUNITY): Payer: Self-pay | Admitting: *Deleted

## 2024-01-21 DIAGNOSIS — I5022 Chronic systolic (congestive) heart failure: Secondary | ICD-10-CM

## 2024-01-21 DIAGNOSIS — Z95811 Presence of heart assist device: Secondary | ICD-10-CM

## 2024-01-23 ENCOUNTER — Encounter (HOSPITAL_COMMUNITY): Payer: Self-pay | Admitting: *Deleted

## 2024-01-23 ENCOUNTER — Ambulatory Visit (HOSPITAL_COMMUNITY): Admitting: Internal Medicine

## 2024-02-25 ENCOUNTER — Telehealth (HOSPITAL_COMMUNITY): Payer: Self-pay
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
## 2024-03-01 ENCOUNTER — Ambulatory Visit (HOSPITAL_COMMUNITY): Admitting: Internal Medicine

## 2024-03-03 ENCOUNTER — Other Ambulatory Visit (HOSPITAL_COMMUNITY): Payer: Self-pay | Admitting: Unknown Physician Specialty

## 2024-03-03 DIAGNOSIS — Z95811 Presence of heart assist device: Secondary | ICD-10-CM

## 2024-03-04 ENCOUNTER — Ambulatory Visit (HOSPITAL_COMMUNITY)
Admission: RE | Admit: 2024-03-04 | Discharge: 2024-03-04 | Disposition: A | Discharge status: Home / Self Care | Source: Ambulatory Visit | Attending: Internal Medicine | Admitting: Internal Medicine

## 2024-03-04 VITALS — BP 80/0 | HR 103 | Wt 115.4 lb

## 2024-03-04 DIAGNOSIS — K859 Acute pancreatitis without necrosis or infection, unspecified: Secondary | ICD-10-CM | POA: Diagnosis not present

## 2024-03-04 DIAGNOSIS — E46 Unspecified protein-calorie malnutrition: Secondary | ICD-10-CM | POA: Insufficient documentation

## 2024-03-04 DIAGNOSIS — I110 Hypertensive heart disease with heart failure: Secondary | ICD-10-CM | POA: Diagnosis present

## 2024-03-04 DIAGNOSIS — Z95811 Presence of heart assist device: Secondary | ICD-10-CM

## 2024-03-04 DIAGNOSIS — I5020 Unspecified systolic (congestive) heart failure: Secondary | ICD-10-CM | POA: Diagnosis not present

## 2024-03-04 DIAGNOSIS — T827XXA Infection and inflammatory reaction due to other cardiac and vascular devices, implants and grafts, initial encounter: Secondary | ICD-10-CM | POA: Diagnosis not present

## 2024-03-04 DIAGNOSIS — I5082 Biventricular heart failure: Secondary | ICD-10-CM | POA: Insufficient documentation

## 2024-03-04 DIAGNOSIS — D696 Thrombocytopenia, unspecified: Secondary | ICD-10-CM | POA: Diagnosis not present

## 2024-03-04 DIAGNOSIS — I5022 Chronic systolic (congestive) heart failure: Secondary | ICD-10-CM | POA: Diagnosis present

## 2024-03-04 DIAGNOSIS — Z79899 Other long term (current) drug therapy: Secondary | ICD-10-CM | POA: Diagnosis not present

## 2024-03-04 DIAGNOSIS — E440 Moderate protein-calorie malnutrition: Secondary | ICD-10-CM | POA: Diagnosis not present

## 2024-03-04 DIAGNOSIS — R7989 Other specified abnormal findings of blood chemistry: Secondary | ICD-10-CM | POA: Diagnosis not present

## 2024-03-04 DIAGNOSIS — I420 Dilated cardiomyopathy: Secondary | ICD-10-CM | POA: Insufficient documentation

## 2024-03-04 DIAGNOSIS — I10 Essential (primary) hypertension: Secondary | ICD-10-CM | POA: Diagnosis not present

## 2024-03-04 DIAGNOSIS — I428 Other cardiomyopathies: Secondary | ICD-10-CM | POA: Diagnosis present

## 2024-03-04 DIAGNOSIS — B9561 Methicillin susceptible Staphylococcus aureus infection as the cause of diseases classified elsewhere: Secondary | ICD-10-CM | POA: Diagnosis not present

## 2024-03-04 DIAGNOSIS — K920 Hematemesis: Secondary | ICD-10-CM | POA: Diagnosis not present

## 2024-03-04 DIAGNOSIS — Z7901 Long term (current) use of anticoagulants: Secondary | ICD-10-CM | POA: Diagnosis not present

## 2024-03-04 LAB — CBC
HCT: 32.9 % — ABNORMAL LOW (ref 39.0–52.0)
Hemoglobin: 11.3 g/dL — ABNORMAL LOW (ref 13.0–17.0)
MCH: 34.6 pg — ABNORMAL HIGH (ref 26.0–34.0)
MCHC: 34.3 g/dL (ref 30.0–36.0)
MCV: 100.6 fL — ABNORMAL HIGH (ref 80.0–100.0)
Platelets: 254 10*3/uL (ref 150–400)
RBC: 3.27 MIL/uL — ABNORMAL LOW (ref 4.22–5.81)
RDW: 13.8 % (ref 11.5–15.5)
WBC: 5 10*3/uL (ref 4.0–10.5)
nRBC: 0 % (ref 0.0–0.2)

## 2024-03-04 LAB — PREALBUMIN: Prealbumin: 30 mg/dL (ref 18–38)

## 2024-03-04 LAB — COMPREHENSIVE METABOLIC PANEL WITH GFR
ALT: 20 U/L (ref 0–44)
AST: 46 U/L — ABNORMAL HIGH (ref 15–41)
Albumin: 4.3 g/dL (ref 3.5–5.0)
Alkaline Phosphatase: 64 U/L (ref 38–126)
Anion gap: 11 (ref 5–15)
BUN: <5 mg/dL — ABNORMAL LOW (ref 6–20)
CO2: 26 mmol/L (ref 22–32)
Calcium: 9.3 mg/dL (ref 8.9–10.3)
Chloride: 102 mmol/L (ref 98–111)
Creatinine, Ser: 0.92 mg/dL (ref 0.61–1.24)
GFR, Estimated: >60 mL/min
Glucose, Bld: 87 mg/dL (ref 70–99)
Potassium: 3.3 mmol/L — ABNORMAL LOW (ref 3.5–5.1)
Sodium: 139 mmol/L (ref 135–145)
Total Bilirubin: 0.8 mg/dL (ref 0.0–1.2)
Total Protein: 7.4 g/dL (ref 6.5–8.1)

## 2024-03-04 LAB — LACTATE DEHYDROGENASE: LDH: 252 U/L — ABNORMAL HIGH (ref 105–235)

## 2024-03-04 MED ORDER — THIAMINE HCL 100 MG PO TABS: 100.0000 mg | ORAL_TABLET | Freq: Every day | ORAL | 11 refills | Status: AC

## 2024-03-04 MED ORDER — PANTOPRAZOLE SODIUM 40 MG PO TBEC: 40.0000 mg | DELAYED_RELEASE_TABLET | Freq: Every day | ORAL | 11 refills | Status: AC

## 2024-03-04 NOTE — Patient Instructions (Signed)
 No medication changes Return to VAD Clinic in 2 months for follow up with Dr. Bensimhon

## 2024-03-04 NOTE — Progress Notes (Signed)
 Patient presents for 3 mo f/u in VAD clinic today alone. Denies issues with VAD equipment or drive line.   Pt states he has been feeling great. Denies lightheadedness, dizziness, falls, shortness of breath, heart failure symptoms, and signs of bleeding.   Pt's weight stable today. States he has plenty of food, and his family brings food over daily. He is drinking an Boost twice a day. Denies drug use.   Pt confirms that he is taking all medications as prescribed. He tells me that he needs refills on Protonix  and Thiamine   Vital Signs:  Doppler Pressure: 80 Automatic BP: 95/79 (86) HR: 103 SPO2: 100%   Weight: 115.4 lb w/o eqt Last clinic weight: 115.2 lb w/o eqt   VAD Indication: Destination Therapy LVAD due to narcotic use   LVAD assessment: HM III: Speed:  5400 rpms                                 Flow: 3.7      Power: 3.9 w                                              PI: 5.9  Hct: 20 -- do not titrate                                   Alarms: mult LV Events: 20-30 daily  Fixed speed: 5400 Low speed limit: 5100  Primary Controller: Replace back up battery in 31 months Back up controller:   DID NOT BRING   I reviewed the LVAD parameters from today and compared the results to the patient's prior recorded data. LVAD interrogation was NEGATIVE for significant power changes, NEGATIVE for clinical alarms, and POSITIVE for PI events/speed drops. No programming changes were made and pump is functioning within specified parameters. Pt is performing daily controller and system monitor self tests along with completing weekly and monthly maintenance for LVAD equipment.   LVAD equipment check completed and is in good working order. Back-up equipment present.   Annual Equipment Maintenance on UBC/PM was performed on 05/08/23. Unable to perform Equipment maintenance due to bug infestation. Pt instructed to not bring bug infested equipment into the hospital.  Exit Site Care: Existing VAD  dressing CDI. Anchor replaced. Pt denies redness, drainage or irritation at exit site. Given 8 weekly kits and 8 anchors for home use.   Device: N/A  BP & Labs:  Doppler BP 80 - Doppler is reflecting modified systolic   Hgb 11.3 -  No S/S of bleeding. Specifically denies melena/BRBPR or nosebleeds.   LDH 252 - with established baseline of 250 - 430. Denies tea-colored urine. No power elevations noted on interrogation.     5 year Intermacs follow up completed including:  Quality of Life, KCCQ-12, and Neurocognitive trail making.   Pt completed 1590 feet during 6 minute walk.  [place] Cardiomyopathy Questionnaire     03/04/2024    1:49 PM 09/25/2023    2:48 PM 01/27/2023    1:08 PM  KCCQ-12  1 a. Ability to shower/bathe Not at all limited Not at all limited Not at all limited  1 b. Ability to walk 1 block Not at all limited Not at all limited Not  at all limited  1 c. Ability to hurry/jog Not at all limited Not at all limited Not at all limited  2. Edema feet/ankles/legs Never over the past 2 weeks Never over the past 2 weeks Never over the past 2 weeks  3. Limited by fatigue Never over the past 2 weeks Never over the past 2 weeks Never over the past 2 weeks  4. Limited by dyspnea Never over the past 2 weeks Never over the past 2 weeks Never over the past 2 weeks  5. Sitting up / on 3+ pillows Never over the past 2 weeks Never over the past 2 weeks Never over the past 2 weeks  6. Limited enjoyment of life Not limited at all Not limited at all Not limited at all  7. Rest of life w/ symptoms Not at all satisfied Not at all satisfied Not at all satisfied  8 a. Participation in hobbies Did not limit at all N/A, did not do for other reasons Did not limit at all  8 b. Participation in chores Did not limit at all N/A, did not do for other reasons Did not limit at all  8 c. Visiting family/friends Did not limit at all N/A, did not do for other reasons Did not limit at all     Patient  Instructions: No medication changes Return to VAD Clinic in 2 months for follow up with Dr. Cherrie Lauraine Ip RN, BSN VAD Coordinator 24/7 Pager [phone]

## 2024-03-04 NOTE — Progress Notes (Addendum)
 "     VAD CLINIC NOTE   Chief complaint: HF  HPI:  [name] is a 39 y/o male from [place]  w/ h/o severe systolic HF due to NICM EF 15% and prior polysubstance abuse (cocaine and ETOH)   Underwent HM-3 LVAD placement on 02/26/19 by Dr. Fleeta Brooke. Required milrinone  post op for RV support.  Admitted 6/21 for AKI and hypotension in setting of volume depletion due to GI bug.Had multiple flow alarms. Echo EF 40-45%   Admitted 12/21 for anemia in setting of supratherapeutic INR (8.0) and bleeding from a sore on his lip. Transfused but did not bleed any further. Developed refractory low flow alarms on VAD with drop in platelets and rising LDH. RUQ U/S was normal (doubt splenic sequestration). Peripheral smear showed no evidence of hemolysis.Patient placed on his left side and low flow alarms resolved. It was suspected possible obstruction/clot of outflow graft causing Plts and Hgb to drop. CT 12/10 showed no evidence of outflow graft kink/clot.  LDH trended back down to his baseline, below 200s. Plts started to trend back up.  Admitted 3/22 for MSSA DL infection requiring surgical debridement. D/c'd to [facility] for IV abx.    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 Wound cx grew MSSA. Treated with cefazolin . Discharged home on 03/16/21 with wound vac. ID recommended oral linezolid  BID for 3 weeks at discharge then switch to chronic cefadroxil  afterwards.  Has been admitted twice since his last clinic visit. Was admitted 07/2023 for reports of coffee ground emesis after multiple vomiting episodes. Severely elevated lactic acid on arrival, was hypovolemic, improved with IVF, no source of bleeding on EGD.  Admitted 08/2023 with syncope after reported episodes of heamtemesis. Hypotensive and hypovolemic again on arrival, found to have acute pancreatitis. UDS + THC/cocaine  Here for routine f/u. Doing well. Continues to do his own DL dressings. Weight stable Denies  orthopnea or PND. No fevers, chills or problems with driveline. No bleeding, melena or neuro symptoms. No VAD alarms. Taking all meds as prescribed.     LVAD Documentation    03/04/2024  Device Info  LVAD Type: Heartmate III  Date of Implant: 02/26/2019  Therapy Type: Destination Therapy      03/04/2024  Vitals  Heart Rate: 103 BPM  Automatic BP: 95/79  Doppler MAP: 80 mmHg  SpO2: 100 %    Last 3 Weights Weight Weight  03/04/2024 52.345 kg 115 lb 6.4 oz  11/26/2023 52.254 kg 115 lb 3.2 oz  09/25/2023 52.073 kg 114 lb 12.8 oz       03/04/2024  LVAD Paramaters  Speed: 5400 RPM  Flow: 4 LPM  PI: 6  Power: 4 Watts  Hematocrit: 20 %  Alarms: mult LV  Events: 20-30  Last Speed Change Date: 02/27/2021  Last Ramp Echo Date: 02/27/2021  Last Right Heart Cath Date: 02/25/2019  Bleeding History: Yes  Type of Bleeding Hx: Other  Type of Bleeding Hx: hematemesis  Type of Dressing: Weekly  Annual Maintenance Date: 05/08/2023    Labs    Units 03/04/24 1250 11/26/23 1111 09/25/23 1337 09/05/23 0300 09/03/23 0254 09/02/23 0435 07/07/23 0207 07/06/23 0228 07/05/23 0415  INR   --   --   --   --   --  0.9  --  0.9 1.0  LDH U/L  --  207* 188 215*   < > 189   < > 163 157  HGB g/dL 88.6* 9.7* 88.4* 89.5*   < >  10.9*   < > 11.0* 10.7*  CREATININE mg/dL  --  8.78 9.06 9.17   < > 2.32*   < > 1.02 1.67*   < > = values in this interval not displayed.        Past Medical History:  Diagnosis Date   Dilated cardiomyopathy (HCC)    Dilated cardiomyopathy (HCC) 02/2019   Polysubstance abuse (HCC)     Current Outpatient Medications  Medication Sig Dispense Refill   cefadroxil  (DURICEF) 500 MG capsule TAKE 1 CAPSULE BY MOUTH TWICE A DAY 60 capsule 11   KLOR-CON  M20 20 MEQ tablet TAKE 2 TABLETS (40 MEQ) BY MOUTH DAILY, OR AS DIRECTED BY HEART FAILURE CLINIC 60 tablet 11   Multiple Vitamin (MULTIVITAMIN WITH MINERALS) TABS tablet Take 1 tablet by mouth daily. 30 tablet 3   pantoprazole   (PROTONIX ) 40 MG tablet Take 1 tablet (40 mg total) by mouth daily. 30 tablet 11   thiamine  (VITAMIN B1) 100 MG tablet Take 1 tablet (100 mg total) by mouth daily. 30 tablet 11   No current facility-administered medications for this encounter.    Patient has no known allergies.  Physical Exam: General:  This No resp difficulty HEENT: normal Neck: supple. no JVD.  Cor: Regular rate & rhythm. No rubs, gallops or murmurs. Lungs: clear Abdomen: soft, nontender, nondistended.Good bowel sounds. Extremities: no cyanosis, clubbing, rash, edema Neuro: alert & orientedx3, cranial nerves grossly intact. moves all 4 extremities w/o difficulty. Affect pleasant   ASSESSMENT AND PLAN:   1. Chronic systolic HF due to severe NICM s/p VAD - Echo EF 10% with biventricular failure - HM-3 VAD implant 02/26/19 - Echo 7/21 EF 40-45% - Echo 09/02/23: EF 40-45% mild AR - Normal LV size on low speed and minimal GDMT. Would be a reasonable recovery candidate, however there would be difficulty uptitrating his medications and does not have an out if he were to fail device extraction so we have decided not to pursue - Stable NYHA I - Volume ok  - MAPs ok   2. Recurrent MSSA DL infection - Initial episode 3/22 - Recurrent infection 2/23 requiring debridement and wound vac.  - Site looks ok today - Continue suppressive cefadroxil .   3. VAD - Admit 12/21 with multiple low flow alarms. Rising LDH and decreasing PLTs. Suspected outflow graft kink occlusion. Patient placed on his left side and resolved.  - VAD interrogated personally. Parameters stable. - DL ok  - Hgb stable 88.6 - LDH pending - Remains off warfarin due to labile INRs and h/o bleeding  4. HTN - MAPs ok  5. Elevated LFTs/thrombocytopenia - suspect related to cefuroxil - RUQ u/s 8/23 ok. Hepatitis panels negative - normalized  6. Weight loss -> protein-calorie malnutrition - weight stable - encouraged better diet  7.  Hematemesis/pancreatitis:  - Recent admissions for primarily GI complaints. Suspect some may be related to ongoing substance use - Much improved today   Toribio Fuel, MD  2:09 PM "

## 2024-05-14 ENCOUNTER — Ambulatory Visit (HOSPITAL_COMMUNITY): Admitting: Internal Medicine
